# Patient Record
Sex: Female | Born: 1960
Health system: Southern US, Community
[De-identification: ages and names within clinical notes are randomized; demographics above are authoritative.]

## PROBLEM LIST (undated history)

## (undated) DIAGNOSIS — E11319 Type 2 diabetes mellitus with unspecified diabetic retinopathy without macular edema: Secondary | ICD-10-CM

## (undated) DIAGNOSIS — I219 Acute myocardial infarction, unspecified: Secondary | ICD-10-CM

## (undated) DIAGNOSIS — G629 Polyneuropathy, unspecified: Secondary | ICD-10-CM

## (undated) DIAGNOSIS — Z794 Long term (current) use of insulin: Secondary | ICD-10-CM

## (undated) DIAGNOSIS — Z8619 Personal history of other infectious and parasitic diseases: Secondary | ICD-10-CM

## (undated) DIAGNOSIS — IMO0002 Reserved for concepts with insufficient information to code with codable children: Secondary | ICD-10-CM

## (undated) DIAGNOSIS — E1165 Type 2 diabetes mellitus with hyperglycemia: Secondary | ICD-10-CM

## (undated) DIAGNOSIS — R351 Nocturia: Secondary | ICD-10-CM

## (undated) DIAGNOSIS — I10 Essential (primary) hypertension: Secondary | ICD-10-CM

## (undated) DIAGNOSIS — E785 Hyperlipidemia, unspecified: Secondary | ICD-10-CM

## (undated) DIAGNOSIS — D3502 Benign neoplasm of left adrenal gland: Secondary | ICD-10-CM

## (undated) DIAGNOSIS — R519 Headache, unspecified: Secondary | ICD-10-CM

## (undated) DIAGNOSIS — Z972 Presence of dental prosthetic device (complete) (partial): Secondary | ICD-10-CM

## (undated) DIAGNOSIS — I2510 Atherosclerotic heart disease of native coronary artery without angina pectoris: Secondary | ICD-10-CM

## (undated) DIAGNOSIS — Z973 Presence of spectacles and contact lenses: Secondary | ICD-10-CM

## (undated) DIAGNOSIS — E876 Hypokalemia: Secondary | ICD-10-CM

## (undated) DIAGNOSIS — C801 Malignant (primary) neoplasm, unspecified: Secondary | ICD-10-CM

## (undated) DIAGNOSIS — N182 Chronic kidney disease, stage 2 (mild): Secondary | ICD-10-CM

## (undated) DIAGNOSIS — N950 Postmenopausal bleeding: Secondary | ICD-10-CM

## (undated) DIAGNOSIS — Z8669 Personal history of other diseases of the nervous system and sense organs: Secondary | ICD-10-CM

## (undated) DIAGNOSIS — H269 Unspecified cataract: Secondary | ICD-10-CM

## (undated) DIAGNOSIS — E1140 Type 2 diabetes mellitus with diabetic neuropathy, unspecified: Secondary | ICD-10-CM

## (undated) DIAGNOSIS — F32A Depression, unspecified: Secondary | ICD-10-CM

## (undated) DIAGNOSIS — M1990 Unspecified osteoarthritis, unspecified site: Secondary | ICD-10-CM

## (undated) DIAGNOSIS — Z8544 Personal history of malignant neoplasm of other female genital organs: Secondary | ICD-10-CM

## (undated) DIAGNOSIS — H35039 Hypertensive retinopathy, unspecified eye: Secondary | ICD-10-CM

## (undated) DIAGNOSIS — F329 Major depressive disorder, single episode, unspecified: Secondary | ICD-10-CM

## (undated) HISTORY — DX: Hypertensive retinopathy, unspecified eye: H35.039

## (undated) HISTORY — PX: UMBILICAL HERNIA REPAIR: SHX196

## (undated) HISTORY — PX: CATARACT EXTRACTION, BILATERAL: SHX1313

## (undated) HISTORY — PX: EYE SURGERY: SHX253

## (undated) HISTORY — DX: Unspecified osteoarthritis, unspecified site: M19.90

## (undated) HISTORY — DX: Headache, unspecified: R51.9

## (undated) HISTORY — PX: CATARACT EXTRACTION: SUR2

## (undated) HISTORY — DX: Major depressive disorder, single episode, unspecified: F32.9

## (undated) HISTORY — DX: Depression, unspecified: F32.A

## (undated) HISTORY — DX: Hyperlipidemia, unspecified: E78.5

## (undated) HISTORY — DX: Type 2 diabetes mellitus with unspecified diabetic retinopathy without macular edema: E11.319

## (undated) HISTORY — PX: COLONOSCOPY: SHX174

## (undated) NOTE — *Deleted (*Deleted)
[facility] - Clinic Note  03/20/2020     CHIEF COMPLAINT Patient presents for No chief complaint on file.   HISTORY OF PRESENT ILLNESS: Kimberly Frazier is a 8 y.o. female who presents to the clinic today for:     Referring physician: Marcine Matar, MD [address] [place],  [place] [address]  HISTORICAL INFORMATION:   Selected notes from the MEDICAL RECORD NUMBER Referred by Dr. Jethro Bolus for concern of CME   CURRENT MEDICATIONS: Current Outpatient Medications (Ophthalmic Drugs)  Medication Sig  . Bromfenac Sodium (PROLENSA) 0.07 % SOLN Place 1 drop into both eyes 4 (four) times daily.  Marland Kitchen ketorolac (ACULAR) 0.5 % ophthalmic solution Place 1 drop into both eyes 4 (four) times daily.  . prednisoLONE acetate (PRED FORTE) 1 % ophthalmic suspension Place 1 drop into both eyes 4 (four) times daily.   No current facility-administered medications for this visit. (Ophthalmic Drugs)   Current Outpatient Medications (Other)  Medication Sig  . Accu-Chek Softclix Lancets lancets Use to check blood sugar 3 times daily.  Marland Kitchen albuterol (VENTOLIN HFA) 108 (90 Base) MCG/ACT inhaler Inhale 2 puffs into the lungs every 6 (six) hours as needed for wheezing or shortness of breath.  Marland Kitchen amLODipine (NORVASC) 5 MG tablet TAKE 1 TABLET (5 MG TOTAL) BY MOUTH DAILY.  Marland Kitchen aspirin EC 81 MG tablet Take 1 tablet (81 mg total) daily by mouth.  Marland Kitchen atorvastatin (LIPITOR) 40 MG tablet TAKE 1 TABLET DAILY BY MOUTH.  . Blood Glucose Monitoring Suppl (ACCU-CHEK GUIDE) w/Device KIT 1 kit by Does not apply route in the morning, at noon, and at bedtime. Use to check blood sugar 3 times daily.  . butalbital-acetaminophen-caffeine (FIORICET) 50-325-40 MG tablet Take 1 tablet by mouth every 6 (six) hours as needed for headache.  . carvedilol (COREG) 25 MG tablet TAKE 1 TABLET (25 MG TOTAL) BY MOUTH 2 (TWO) TIMES DAILY WITH A MEAL.  Marland Kitchen empagliflozin (JARDIANCE) 10 MG TABS tablet Take 1 tablet (10 mg  total) by mouth daily.  Marland Kitchen glucose blood (ACCU-CHEK GUIDE) test strip Use to check blood sugar 3 times daily.  . insulin aspart (NOVOLOG) 100 UNIT/ML injection Inject 28 Units into the skin 3 (three) times daily before meals.  . insulin glargine (LANTUS SOLOSTAR) 100 UNIT/ML Solostar Pen INJECT 60 UNITS INTO THE SKIN DAILY.  Marland Kitchen Insulin Syringe-Needle U-100 (TRUEPLUS INSULIN SYRINGE) 31G X 5/16" 0.3 ML MISC Use to inject Humalog three times daily.  . isosorbide mononitrate (IMDUR) 60 MG 24 hr tablet TAKE 1 TABLET (60 MG TOTAL) BY MOUTH DAILY.  . nitroGLYCERIN (NITROSTAT) 0.4 MG SL tablet Place 1 tab under tongue for chest pain.  May repeat after 5 minutes x 2.  DO NOT TAKE MORE THAN 3 TABS DURING AN EPISODE OF CHEST PAIN (Patient taking differently: Place 0.4 mg under the tongue every 5 (five) minutes as needed for chest pain. DO NOT TAKE MORE THAN 3 TABS DURING AN EPISODE OF CHEST PAIN)  . potassium chloride (KLOR-CON) 10 MEQ tablet Take 1 tablet (10 mEq total) by mouth daily.  Marland Kitchen tiZANidine (ZANAFLEX) 4 MG tablet Take 1 tablet (4 mg total) by mouth every 6 (six) hours as needed for muscle spasms.  Marland Kitchen topiramate (TOPAMAX) 50 MG tablet Take 1 tablet (50 mg total) by mouth 2 (two) times daily.  . TRUEPLUS PEN NEEDLES 32G X 4 MM MISC USE AS DIRECTED WITH INSULIN   No current facility-administered medications for this visit. (Other)  REVIEW OF SYSTEMS:    ALLERGIES No Known Allergies  PAST MEDICAL HISTORY Past Medical History:  Diagnosis Date  . Adrenal adenoma, left   . Arthritis   . CKD (chronic kidney disease), stage II   . Coronary artery disease 07-28-2018 [facility]  currently due to no insurance   per cardiac cath 02-06-2015 (positive mild lateral ishcemia on stress test)--- dLAD 80%,  mLAD 40%,  mPDA 80% (small vessel),  ostial D1 70%,  CFx with lumial irregarlities-- medical management  . Depression   . Diabetic neuropathy (HCC)   . Diabetic  retinopathy (HCC)    NPDR OU  . Headache   . History of Bell's palsy 07/2011   per pt residual facial pain on left side occasionally  . History of cancer of vagina 1999   per pt completed radiation and chemo  . History of sepsis 12/30/2017   positive blood culter fro E.coli  . Hyperlipidemia   . Hypertension   . Hypertensive retinopathy    OU  . Hypokalemia   . Insulin dependent type 2 diabetes mellitus, uncontrolled (HCC)    followed by pcp---  A1c was 11.7 on 06-15-2018 in epic  . Myocardial infarction (HCC)   . Nocturia   . Peripheral neuropathy   . PMB (postmenopausal bleeding)   . Wears dentures    upper  . Wears glasses    Past Surgical History:  Procedure Laterality Date  . BREAST BIOPSY  2012   benign  . CARDIAC CATHETERIZATION N/A 02/06/2015   Procedure: Left Heart Cath and Coronary Angiography;  Surgeon: Laurey Morale, MD;  Location: [facility] INVASIVE CV LAB;  Service: Cardiovascular;  Laterality: N/A;  . CATARACT EXTRACTION Bilateral OD: 03/07/19, OS: 02/28/19   Dr. Nile Riggs  . CATARACT EXTRACTION, BILATERAL    . EYE SURGERY Bilateral OD: 03/07/19, OS: 02/28/19   Cat Sx - Dr. Nile Riggs  . HYSTEROSCOPY WITH D & C N/A 08/01/2018   Procedure: DILATATION AND CURETTAGE /HYSTEROSCOPY;  Surgeon: Catalina Antigua, MD;  Location: [facility];  Service: Gynecology;  Laterality: N/A;  . ROBOTIC ASSISTED TOTAL HYSTERECTOMY WITH BILATERAL SALPINGO OOPHERECTOMY N/A 11/28/2018   Procedure: XI ROBOTIC ASSISTED TOTAL HYSTERECTOMY WITH BILATERAL SALPINGO OOPHORECTOMY;  Surgeon: Adolphus Birchwood, MD;  Location: [facility];  Service: Gynecology;  Laterality: N/A;  . SENTINEL NODE BIOPSY N/A 11/28/2018   Procedure: SENTINEL NODE BIOPSY;  Surgeon: Adolphus Birchwood, MD;  Location: [facility];  Service: Gynecology;  Laterality: N/A;  . UMBILICAL HERNIA REPAIR  child    FAMILY HISTORY Family History  Problem Relation Age of Onset  . Heart disease Mother   . Hypertension Mother   . Diabetes Mother   .  Cancer Father        hodgkins lymphoma  . Heart disease Sister        heart attack  . Diabetes Sister   . Cancer Other        parent  . Diabetes Other        parent  . Heart disease Other        parent  . Hyperlipidemia Other        parent  . Hypertension Other        parent  . Arthritis Other        parent  . Diabetes Maternal Aunt   . Diabetes Maternal Uncle   . Breast cancer Neg Hx     SOCIAL HISTORY Social History   Tobacco Use  .  Smoking status: Never Smoker  . Smokeless tobacco: Never Used  Vaping Use  . Vaping Use: Never used  Substance Use Topics  . Alcohol use: Not Currently  . Drug use: Not Currently    Types: Marijuana    Comment: 07-28-2018  per pt last smoked 11/21/2018         OPHTHALMIC EXAM:  Not recorded     IMAGING AND PROCEDURES  Imaging and Procedures for @TODAY @           ASSESSMENT/PLAN:    ICD-10-CM   1. Moderate nonproliferative diabetic retinopathy of both eyes with macular edema associated with type 2 diabetes mellitus (HCC)  O13.0865   2. CME (cystoid macular edema), bilateral  H35.353   3. Retinal edema  H35.81   4. Essential hypertension  I10   5. Hypertensive retinopathy of both eyes  H35.033   6. Pseudophakia of both eyes  Z96.1     1-3. Moderate Non-proliferative diabetic retinopathy, OU OD with combination DME/CME  - exam shows scattered IRH, edema and tortuous blood vessels OU  - FA (10.23.20) shows leaking MA OU and paracentral petaloid hyperfluorecence OD --> CME/[place]  - OCT shows diabetic macular edema, OU; OD with CME/[place] component contributing as well  - s/p IVA OS #1 (10.23.20), #2 (11.18.20), #3 (12.18.20), #4 (02.24.21), #5 (03.24.21)  - s/p IVA OD #1 (11.06.20), #2 (12.18.20), #3 (02.24.21), #4 (03.24.21), #5 (04.21.21), #6 (5.26.21), #7 (07.26.21), #8 (08.25.21), #9 (09.23.21)  - s/p IVE OS #1 (04.21.21), # 2 (5.26.21), #3 (07.26.21), #4 (08.25.21), #5 (09.23.21)  - BCVA OD 20/20; OS  20/100+2  - OCT today, 8.25.21 OD: persistent IRF/IRHM; OS: persistent IRF -- slightly improved  - recommend IVA OD #10 and IVE #6 OS 10.21.21  - pt wishes to proceed  - RBA of procedure discussed, questions answered  - informed consent obtained  - Avastin informed consent form signed and scanned on 12.18.2020  - Eylea OS informed consent form signed and scanned on 4.21.2021  - see procedure note  - Eylea4U benefits investigation started, 04.21.21  - cont Pred Forte and Ketorolac QID OU  - f/u in 4 weeks, DFE, OCT, possible injection OU  4,5. Hypertensive retinopathy OU  - discussed importance of tight BP control  - monitor  6. Pseudophakia OU  - s/p CE/IOL OU Nile Riggs)   OD: 03/07/2019   OS: 02/28/2019  - IOLs in good position  - CME OU as above  - monitor  Ophthalmic Meds Ordered this visit:  No orders of the defined types were placed in this encounter.      No follow-ups on file.  There are no Patient Instructions on file for this visit.   Explained the diagnoses, plan, and follow up with the patient and they expressed understanding.  Patient expressed understanding of the importance of proper follow up care.   This document serves as a record of services personally performed by Karie Chimera, MD, PhD. It was created on their behalf by Joni Reining, an ophthalmic technician. The creation of this record is the provider's dictation and/or activities during the visit.    Electronically signed by: Joni Reining COA, 03/17/20  12:07 PM   Karie Chimera, M.D., Ph.D. Diseases & Surgery of the Retina and Vitreous [facility]    Abbreviations: M myopia (nearsighted); A astigmatism; H hyperopia (farsighted); P presbyopia; Mrx spectacle prescription;  CTL contact lenses; OD right eye; OS left eye; OU both eyes  XT exotropia;  ET esotropia; PEK punctate epithelial keratitis; PEE punctate epithelial erosions; DES dry eye syndrome; MGD meibomian gland  dysfunction; ATs artificial tears; PFAT's preservative free artificial tears; NSC nuclear sclerotic cataract; PSC posterior subcapsular cataract; ERM epi-retinal membrane; PVD posterior vitreous detachment; RD retinal detachment; DM diabetes mellitus; DR diabetic retinopathy; NPDR non-proliferative diabetic retinopathy; PDR proliferative diabetic retinopathy; CSME clinically significant macular edema; DME diabetic macular edema; dbh dot blot hemorrhages; CWS cotton wool spot; POAG primary open angle glaucoma; C/D cup-to-disc ratio; HVF humphrey visual field; GVF goldmann visual field; OCT optical coherence tomography; IOP intraocular pressure; BRVO Branch retinal vein occlusion; CRVO central retinal vein occlusion; CRAO central retinal artery occlusion; BRAO branch retinal artery occlusion; RT retinal tear; SB scleral buckle; PPV pars plana vitrectomy; VH Vitreous hemorrhage; PRP panretinal laser photocoagulation; IVK intravitreal kenalog; VMT vitreomacular traction; MH Macular hole;  NVD neovascularization of the disc; NVE neovascularization elsewhere; AREDS age related eye disease study; ARMD age related macular degeneration; POAG primary open angle glaucoma; EBMD epithelial/anterior basement membrane dystrophy; ACIOL anterior chamber intraocular lens; IOL intraocular lens; PCIOL posterior chamber intraocular lens; Phaco/IOL phacoemulsification with intraocular lens placement; PRK photorefractive keratectomy; LASIK laser assisted in situ keratomileusis; HTN hypertension; DM diabetes mellitus; COPD chronic obstructive pulmonary disease

---
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
## 1997-05-31 DIAGNOSIS — Z8544 Personal history of malignant neoplasm of other female genital organs: Secondary | ICD-10-CM

## 1997-05-31 HISTORY — DX: Personal history of malignant neoplasm of other female genital organs: Z85.44

## 1997-12-18 ENCOUNTER — Ambulatory Visit: Admission: RE | Admit: 1997-12-18 | Discharge: 1997-12-18 | Payer: Self-pay | Admitting: Gynecologic Oncology

## 1998-01-21 ENCOUNTER — Ambulatory Visit: Admission: RE | Admit: 1998-01-21 | Discharge: 1998-01-21 | Payer: Self-pay | Admitting: Gynecologic Oncology

## 1998-05-16 ENCOUNTER — Emergency Department (HOSPITAL_COMMUNITY): Admission: EM | Admit: 1998-05-16 | Discharge: 1998-05-16 | Payer: Self-pay | Admitting: Emergency Medicine

## 1998-05-16 ENCOUNTER — Encounter: Admission: RE | Admit: 1998-05-16 | Discharge: 1998-05-16 | Payer: Self-pay | Admitting: Family Medicine

## 1998-07-11 ENCOUNTER — Encounter: Admission: RE | Admit: 1998-07-11 | Discharge: 1998-07-11 | Payer: Self-pay | Admitting: Family Medicine

## 1998-10-16 ENCOUNTER — Other Ambulatory Visit: Admission: RE | Admit: 1998-10-16 | Discharge: 1998-10-16 | Payer: Self-pay | Admitting: Internal Medicine

## 1999-09-29 ENCOUNTER — Encounter (INDEPENDENT_AMBULATORY_CARE_PROVIDER_SITE_OTHER): Payer: Self-pay | Admitting: *Deleted

## 1999-09-29 LAB — CONVERTED CEMR LAB

## 1999-10-20 ENCOUNTER — Encounter: Admission: RE | Admit: 1999-10-20 | Discharge: 1999-10-20 | Payer: Self-pay | Admitting: Family Medicine

## 1999-10-20 ENCOUNTER — Other Ambulatory Visit: Admission: RE | Admit: 1999-10-20 | Discharge: 1999-10-20 | Payer: Self-pay | Admitting: Family Medicine

## 1999-12-23 ENCOUNTER — Ambulatory Visit (HOSPITAL_COMMUNITY): Admission: RE | Admit: 1999-12-23 | Discharge: 1999-12-23 | Payer: Self-pay | Admitting: *Deleted

## 2000-02-21 ENCOUNTER — Inpatient Hospital Stay (HOSPITAL_COMMUNITY): Admission: AD | Admit: 2000-02-21 | Discharge: 2000-02-21 | Payer: Self-pay | Admitting: *Deleted

## 2001-07-26 ENCOUNTER — Emergency Department (HOSPITAL_COMMUNITY): Admission: EM | Admit: 2001-07-26 | Discharge: 2001-07-26 | Payer: Self-pay | Admitting: Emergency Medicine

## 2002-01-10 ENCOUNTER — Other Ambulatory Visit: Admission: RE | Admit: 2002-01-10 | Discharge: 2002-01-10 | Payer: Self-pay | Admitting: Obstetrics

## 2003-01-18 ENCOUNTER — Encounter: Payer: Self-pay | Admitting: Geriatric Medicine

## 2003-01-18 ENCOUNTER — Inpatient Hospital Stay (HOSPITAL_COMMUNITY): Admission: EM | Admit: 2003-01-18 | Discharge: 2003-01-21 | Payer: Self-pay | Admitting: Emergency Medicine

## 2004-02-17 ENCOUNTER — Emergency Department (HOSPITAL_COMMUNITY): Admission: EM | Admit: 2004-02-17 | Discharge: 2004-02-17 | Payer: Self-pay | Admitting: Family Medicine

## 2004-12-27 ENCOUNTER — Emergency Department (HOSPITAL_COMMUNITY): Admission: EM | Admit: 2004-12-27 | Discharge: 2004-12-27 | Payer: Self-pay | Admitting: Family Medicine

## 2005-01-15 ENCOUNTER — Emergency Department (HOSPITAL_COMMUNITY): Admission: EM | Admit: 2005-01-15 | Discharge: 2005-01-15 | Payer: Self-pay | Admitting: Family Medicine

## 2005-10-07 ENCOUNTER — Ambulatory Visit (HOSPITAL_COMMUNITY): Admission: RE | Admit: 2005-10-07 | Discharge: 2005-10-07 | Payer: Self-pay | Admitting: Internal Medicine

## 2006-07-07 ENCOUNTER — Emergency Department (HOSPITAL_COMMUNITY): Admission: EM | Admit: 2006-07-07 | Discharge: 2006-07-07 | Payer: Self-pay | Admitting: Family Medicine

## 2006-07-29 ENCOUNTER — Encounter (INDEPENDENT_AMBULATORY_CARE_PROVIDER_SITE_OTHER): Payer: Self-pay | Admitting: *Deleted

## 2007-07-20 ENCOUNTER — Ambulatory Visit (HOSPITAL_COMMUNITY): Admission: RE | Admit: 2007-07-20 | Discharge: 2007-07-20 | Payer: Self-pay | Admitting: Obstetrics

## 2008-04-04 ENCOUNTER — Encounter: Admission: RE | Admit: 2008-04-04 | Discharge: 2008-04-04 | Payer: Self-pay | Admitting: Internal Medicine

## 2008-12-17 ENCOUNTER — Ambulatory Visit (HOSPITAL_COMMUNITY): Admission: RE | Admit: 2008-12-17 | Discharge: 2008-12-17 | Payer: Self-pay | Admitting: Internal Medicine

## 2010-05-31 HISTORY — PX: BREAST BIOPSY: SHX20

## 2010-06-20 ENCOUNTER — Emergency Department (HOSPITAL_COMMUNITY)
Admission: EM | Admit: 2010-06-20 | Discharge: 2010-06-20 | Payer: Self-pay | Source: Home / Self Care | Admitting: Emergency Medicine

## 2010-10-16 NOTE — Discharge Summary (Signed)
   [name], Kimberly Frazier                            ACCOUNT NO.:  [id number]   MEDICAL RECORD NO.:  [id number]                   PATIENT TYPE:  INP   LOCATION:  6729                                 FACILITY:  [facility]   PHYSICIAN:  Georgann Housekeeper, M.D.                 DATE OF BIRTH:  1961/04/24   DATE OF ADMISSION:  01/18/2003  DATE OF DISCHARGE:  01/21/2003                                 DISCHARGE SUMMARY   DISCHARGE DIAGNOSES:  1. Type 2 diabetes, new onset, with hyperglycemia.  2. Hypokalemia.  3. Hypertension.  4. Mild dehydration.   MEDICATIONS AT DISCHARGE:  1. Zestoretic 20/25 mg one a day.  2. Glucotrol 10 mg b.i.d.   FOLLOWUP:  Diabetic and nutrition teaching.  Blood sugar monitoring.  Follow  up in 10 days.   DIET:  A low-carbohydrate, low-sugar diet.   HISTORY OF PRESENT ILLNESS:  Please see the H&P for details.  The patient is  50 years old with a family history of diabetes.  She presented with two to  three weeks of worsening thirst, polyuria, and polydipsia with sugars in the  800s.  Admission was 874 with a sodium of 128.  Correct was 138.  BUN 14,  creatinine 1.2.  Mildly hypokalemic.  The patient was started on  Glucommander.   HOSPITAL COURSE:  #1 - TYPE 2 DIABETES, NEW ONSET, WITH HYPERGLYCEMIA:  Started on Glucommander.  Improved blood sugars with insulin and aggressive  hydration.  Was switched to oral agents, Glucotrol and Glucophage.  The  patient had nausea, heartburn, and indigestion with Glucophage and could not  tolerate that.  The patient would continue on Glucotrol and follow up with  blood sugar at home.  Her blood sugar was in the 280s at the time of  discharge.  She might need Avandia or Actos as an outpatient.  Will see how  she does in the next 10 days with Glucotrol and titrate the medication as  tolerated.  The other option is insulin if she does not control with oral  agents.  A1C was done at the office and that is pending.   #2 -  HYPERTENSION:  The patient has been restarted on Zestril in the  hospital after the second day.  She will continue the blood pressure  medication.  Will need lipids and aggressive dietary management.                                                Georgann Housekeeper, M.D.    Arliss Journey  D:  01/21/2003  T:  01/21/2003  Job:  [id number]

## 2010-11-02 ENCOUNTER — Other Ambulatory Visit (HOSPITAL_COMMUNITY): Payer: Self-pay | Admitting: Internal Medicine

## 2010-11-02 DIAGNOSIS — Z1231 Encounter for screening mammogram for malignant neoplasm of breast: Secondary | ICD-10-CM

## 2010-11-10 ENCOUNTER — Ambulatory Visit (HOSPITAL_COMMUNITY)
Admission: RE | Admit: 2010-11-10 | Discharge: 2010-11-10 | Disposition: A | Discharge status: Home / Self Care | Payer: 59 | Source: Ambulatory Visit | Attending: Internal Medicine | Admitting: Internal Medicine

## 2010-11-10 ENCOUNTER — Other Ambulatory Visit: Payer: Self-pay | Admitting: Internal Medicine

## 2010-11-10 DIAGNOSIS — Z1231 Encounter for screening mammogram for malignant neoplasm of breast: Secondary | ICD-10-CM

## 2010-11-11 ENCOUNTER — Other Ambulatory Visit: Payer: Self-pay | Admitting: Internal Medicine

## 2010-11-11 DIAGNOSIS — N63 Unspecified lump in unspecified breast: Secondary | ICD-10-CM

## 2010-11-16 ENCOUNTER — Other Ambulatory Visit: Payer: Self-pay | Admitting: Internal Medicine

## 2010-11-16 DIAGNOSIS — N63 Unspecified lump in unspecified breast: Secondary | ICD-10-CM

## 2010-11-19 ENCOUNTER — Ambulatory Visit
Admission: RE | Admit: 2010-11-19 | Discharge: 2010-11-19 | Disposition: A | Discharge status: Home / Self Care | Payer: 59 | Source: Ambulatory Visit | Attending: Internal Medicine | Admitting: Internal Medicine

## 2010-11-19 ENCOUNTER — Other Ambulatory Visit: Payer: Self-pay | Admitting: Internal Medicine

## 2010-11-19 DIAGNOSIS — N63 Unspecified lump in unspecified breast: Secondary | ICD-10-CM

## 2010-11-23 ENCOUNTER — Other Ambulatory Visit: Payer: 59

## 2010-11-26 ENCOUNTER — Ambulatory Visit
Admission: RE | Admit: 2010-11-26 | Discharge: 2010-11-26 | Disposition: A | Discharge status: Home / Self Care | Payer: 59 | Source: Ambulatory Visit | Attending: Internal Medicine | Admitting: Internal Medicine

## 2010-11-26 ENCOUNTER — Other Ambulatory Visit: Payer: Self-pay | Admitting: Diagnostic Radiology

## 2010-11-26 DIAGNOSIS — N63 Unspecified lump in unspecified breast: Secondary | ICD-10-CM

## 2011-06-01 LAB — HM DIABETES FOOT EXAM

## 2011-07-02 DIAGNOSIS — Z8669 Personal history of other diseases of the nervous system and sense organs: Secondary | ICD-10-CM

## 2011-07-02 HISTORY — DX: Personal history of other diseases of the nervous system and sense organs: Z86.69

## 2011-07-26 ENCOUNTER — Encounter (HOSPITAL_COMMUNITY): Payer: Self-pay | Admitting: Emergency Medicine

## 2011-07-26 ENCOUNTER — Emergency Department (HOSPITAL_COMMUNITY)
Admission: EM | Admit: 2011-07-26 | Discharge: 2011-07-26 | Disposition: A | Discharge status: Home Health | Payer: 59 | Attending: Emergency Medicine | Admitting: Emergency Medicine

## 2011-07-26 ENCOUNTER — Emergency Department (HOSPITAL_COMMUNITY): Payer: 59

## 2011-07-26 DIAGNOSIS — I10 Essential (primary) hypertension: Secondary | ICD-10-CM | POA: Insufficient documentation

## 2011-07-26 DIAGNOSIS — R51 Headache: Secondary | ICD-10-CM | POA: Insufficient documentation

## 2011-07-26 DIAGNOSIS — R29810 Facial weakness: Secondary | ICD-10-CM | POA: Insufficient documentation

## 2011-07-26 DIAGNOSIS — G510 Bell's palsy: Secondary | ICD-10-CM | POA: Insufficient documentation

## 2011-07-26 DIAGNOSIS — Z79899 Other long term (current) drug therapy: Secondary | ICD-10-CM | POA: Insufficient documentation

## 2011-07-26 DIAGNOSIS — E119 Type 2 diabetes mellitus without complications: Secondary | ICD-10-CM | POA: Insufficient documentation

## 2011-07-26 HISTORY — DX: Essential (primary) hypertension: I10

## 2011-07-26 LAB — BASIC METABOLIC PANEL
CO2: 27 mEq/L (ref 19–32)
Chloride: 103 mEq/L (ref 96–112)
Creatinine, Ser: 0.72 mg/dL (ref 0.50–1.10)
GFR calc Af Amer: >90 mL/min (ref >90)
Potassium: 2.9 mEq/L — ABNORMAL LOW (ref 3.5–5.1)
Sodium: 141 mEq/L (ref 135–145)

## 2011-07-26 LAB — CBC
HCT: 42.5 % (ref 36.0–46.0)
Hemoglobin: 14.3 g/dL (ref 12.0–15.0)
MCV: 88.4 fL (ref 78.0–100.0)
RBC: 4.81 MIL/uL (ref 3.87–5.11)
WBC: 15.4 10*3/uL — ABNORMAL HIGH (ref 4.0–10.5)

## 2011-07-26 MED ORDER — POLYVINYL ALCOHOL 1.4 % OP SOLN: 1.0000 [drp] | OPHTHALMIC | Status: AC | PRN

## 2011-07-26 NOTE — Discharge Instructions (Signed)
Bell's Palsy Bell's palsy is a condition in which the muscles on one side of the face cannot move (paralysis). This is because the nerves in the face are paralyzed. It is most often thought to be caused by a virus. The virus causes swelling of the nerve that controls movement on one side of the face. The nerve travels through a tight space surrounded by bone. When the nerve swells, it can be compressed by the bone. This results in damage to the protective covering around the nerve. This damage interferes with how the nerve communicates with the muscles of the face. As a result, it can cause weakness or paralysis of the facial muscles.  Injury (trauma), tumor, and surgery may cause Bell's palsy, but most of the time the cause is unknown. It is a relatively common condition. It starts suddenly (abrupt onset) with the paralysis usually ending within 2 days. Bell's palsy is not dangerous. But because the eye does not close properly, you may need care to keep the eye from getting dry. This can include splinting (to keep the eye shut) or moistening with artificial tears. Bell's palsy very seldom occurs on both sides of the face at the same time. SYMPTOMS   Eyebrow sagging.   Drooping of the eyelid and corner of the mouth.   Inability to close one eye.   Loss of taste on the front of the tongue.   Sensitivity to loud noises.  TREATMENT  The treatment is usually non-surgical. If the patient is seen within the first 24 to 48 hours, a short course of steroids may be prescribed, in an attempt to shorten the length of the condition. Antiviral medicines may also be used with the steroids, but it is unclear if they are helpful.  You will need to protect your eye, if you cannot close it. The cornea (clear covering over your eye) will become dry and can be damaged. Artificial tears can be used to keep your eye moist. Glasses or an eye patch should be worn to protect your eye. PROGNOSIS  Recovery is variable,  ranging from days to months. Although the problem usually goes away completely (about 80% of cases resolve), predicting the outcome is impossible. Most people improve within 3 weeks of when the symptoms began. Improvement may continue for 3 to 6 months. A small number of people have moderate to severe weakness that is permanent.  HOME CARE INSTRUCTIONS   If your caregiver prescribed medication to reduce swelling in the nerve, use as directed. Do not stop taking the medication unless directed by your caregiver.   Use moisturizing eye drops as needed to prevent drying of your eye, as directed by your caregiver.   Protect your eye, as directed by your caregiver.   Use facial massage and exercises, as directed by your caregiver.   Perform your normal activities, and get your normal rest.  SEEK IMMEDIATE MEDICAL CARE IF:   There is pain, redness or irritation in the eye.   You or your child has an oral temperature above 102 F (38.9 C), not controlled by medicine.  MAKE SURE YOU:   Understand these instructions.   Will watch your condition.   Will get help right away if you are not doing well or get worse.  Document Released: 05/17/2005 Document Revised: 01/27/2011 Document Reviewed: 05/26/2009 [facility] Patient Information 2012 [place], [place].

## 2011-07-26 NOTE — ED Notes (Signed)
Pt c/o left sided facial pain and droop noted starting on Saturday; pt sts left eye draining as well; pt denies any other weakness; pt sts liquids running out of mouth; pt equal grip strength and no other neuro deficits noted

## 2011-07-26 NOTE — ED Provider Notes (Signed)
History     CSN: [id number]  Arrival date & time 07/26/11  1478   First MD Initiated Contact with Patient 07/26/11 1004      Chief Complaint  Patient presents with  . Facial Droop    (Consider location/radiation/quality/duration/timing/severity/associated sxs/prior treatment) HPI Pt presents with left facial droop that started Saturday.  She says she was drinking water and it would fall out of the left side of her mouth.  She feels like she cannot control the left side of her lips normally.  She says sometimes her left eye seems like there is a haziness over it.  She also complains of a headache, which is left sided.   She has never had a stroke or heart attack in the past, but does say that her blood sugar and blood pressure are not well controlled.    Past Medical History  Diagnosis Date  . Hypertension   . Diabetes mellitus     History reviewed. No pertinent past surgical history.  History reviewed. No pertinent family history.  History  Substance Use Topics  . Smoking status: Never Smoker   . Smokeless tobacco: Not on file  . Alcohol Use: No    Review of Systems  Allergies  Review of patient's allergies indicates no known allergies.  Home Medications   Current Outpatient Rx  Name Route Sig Dispense Refill  . GLIMEPIRIDE 4 MG PO TABS Oral Take 4 mg by mouth daily before breakfast.    . HYDRALAZINE HCL 50 MG PO TABS Oral Take 50 mg by mouth 2 (two) times daily.    . INSULIN ASPART PROT & ASPART (70-30) 100 UNIT/ML [place] SUSP Subcutaneous Inject 65 Units into the skin 2 (two) times daily with a meal.    . LISINOPRIL-HYDROCHLOROTHIAZIDE 20-25 MG PO TABS Oral Take 1 tablet by mouth daily.      BP 154/91  Pulse 105  Temp(Src) 98.4 F (36.9 C) (Oral)  Resp 18  SpO2 98%  Physical Exam  Constitutional: She is oriented to person, place, and time. She appears well-developed and well-nourished. No distress.  HENT:  Head: Normocephalic and atraumatic.  Mouth/Throat:  Oropharynx is clear and moist.  Eyes: EOM are normal. Pupils are equal, round, and reactive to light.  Neck: Normal range of motion. No JVD present.  Cardiovascular: Normal rate, regular rhythm and normal heart sounds.   Pulmonary/Chest: Effort normal and breath sounds normal.  Abdominal: Soft. Bowel sounds are normal.  Musculoskeletal: She exhibits no edema.  Neurological: She is alert and oriented to person, place, and time. She has normal strength. A cranial nerve deficit is present. No sensory deficit.       Pt has asymmetry of smile, as well as forehead with wrinkling.  Other CN in tact.     ED Course  Procedures (including critical care time)  Labs Reviewed  BASIC METABOLIC PANEL - Abnormal; Notable for the following:    Potassium 2.9 (*)    All other components within normal limits  CBC - Abnormal; Notable for the following:    WBC 15.4 (*)    All other components within normal limits   Ct Head Wo Contrast  07/26/2011  *RADIOLOGY REPORT*  Clinical Data: Slurred speech.  Left facial droop.  CT HEAD WITHOUT CONTRAST  Technique:  Contiguous axial images were obtained from the base of the skull through the vertex without contrast.  Comparison: None.  Findings: The brain has a normal appearance without evidence of malformation, atrophy, old or acute  infarction, mass lesion, hemorrhage, hydrocephalus or extra-axial collection.  The calvarium is unremarkable.  Sinuses, middle ears and mastoids are clear.  IMPRESSION: Normal head CT  Original Report Authenticated By: Thomasenia Sales, M.D.     1. Bell's palsy       MDM  Pt education provided, will Rx Liquid tears.  Pt to follow up with PCP and/or [facility].         Ardyth Gal, MD 07/26/11 1149

## 2011-07-26 NOTE — ED Notes (Signed)
[place] sandwich provided to patient per patient request.  Updated patient on status.

## 2011-07-26 NOTE — ED Provider Notes (Signed)
I saw and evaluated the patient, reviewed the resident's note and I agree with the findings and plan.  Patient seen by me symptoms at least present for the past 2 days consistent with a left-sided Bell's palsy facial nerve paralysis involves the 4 head. Not consistent with CVA or stroke. Head CT was negative patient has followup with neurology and has a local primary care Dr.  Shelda Jakes, MD 07/26/11 1213

## 2011-07-26 NOTE — ED Notes (Signed)
Patient presents with headache, moments of slurred speech, left facial droop, and left eye irritation intermittently since Saturday. Grips equal bilaterally, no arm drift noted, patient face seems slightly asymmetrical but no significant droop noted.  Patient alert and oriented x 4 follows commands appropriately, no distress noted, patient resting quietly

## 2012-05-12 ENCOUNTER — Ambulatory Visit: Payer: 59 | Admitting: Internal Medicine

## 2012-05-12 DIAGNOSIS — Z0289 Encounter for other administrative examinations: Secondary | ICD-10-CM

## 2012-07-26 ENCOUNTER — Other Ambulatory Visit (INDEPENDENT_AMBULATORY_CARE_PROVIDER_SITE_OTHER): Payer: 59

## 2012-07-26 ENCOUNTER — Ambulatory Visit (INDEPENDENT_AMBULATORY_CARE_PROVIDER_SITE_OTHER): Payer: 59 | Admitting: Internal Medicine

## 2012-07-26 ENCOUNTER — Encounter: Payer: Self-pay | Admitting: Internal Medicine

## 2012-07-26 VITALS — BP 148/92 | HR 99 | Temp 98.5°F | Ht 64.0 in | Wt 277.4 lb

## 2012-07-26 DIAGNOSIS — Z1329 Encounter for screening for other suspected endocrine disorder: Secondary | ICD-10-CM

## 2012-07-26 LAB — LIPID PANEL
Total CHOL/HDL Ratio: 6
Triglycerides: 170 mg/dL — ABNORMAL HIGH (ref 0.0–149.0)

## 2012-07-26 LAB — BASIC METABOLIC PANEL
BUN: 15 mg/dL (ref 6–23)
CO2: 30 mEq/L (ref 19–32)
Calcium: 9.2 mg/dL (ref 8.4–10.5)
Chloride: 100 mEq/L (ref 96–112)
Creatinine, Ser: 0.8 mg/dL (ref 0.4–1.2)
GFR: 90.89 mL/min (ref >60.00)
Glucose, Bld: 173 mg/dL — ABNORMAL HIGH (ref 70–99)
Potassium: 3.3 mEq/L — ABNORMAL LOW (ref 3.5–5.1)
Sodium: 138 mEq/L (ref 135–145)

## 2012-07-26 LAB — CBC
HCT: 41 % (ref 36.0–46.0)
Hemoglobin: 13.7 g/dL (ref 12.0–15.0)
MCHC: 33.4 g/dL (ref 30.0–36.0)
MCV: 88.2 fl (ref 78.0–100.0)
Platelets: 224 10*3/uL (ref 150.0–400.0)
RBC: 4.65 Mil/uL (ref 3.87–5.11)
RDW: 14.6 % (ref 11.5–14.6)
WBC: 11.1 10*3/uL — ABNORMAL HIGH (ref 4.5–10.5)

## 2012-07-26 LAB — TSH: TSH: 0.99 u[IU]/mL (ref 0.35–5.50)

## 2012-07-26 MED ORDER — INSULIN ASPART PROT & ASPART (70-30 MIX) 100 UNIT/ML ~~LOC~~ SUSP: 62.0000 [IU] | Freq: Every day | SUBCUTANEOUS | Status: DC

## 2012-07-26 MED ORDER — "INSULIN SYRINGE-NEEDLE U-100 31G X 5/16"" 1 ML MISC": Status: DC

## 2012-07-26 MED ORDER — GLIMEPIRIDE 4 MG PO TABS: 4.0000 mg | ORAL_TABLET | Freq: Every day | ORAL | Status: DC

## 2012-07-26 MED ORDER — HYDRALAZINE HCL 50 MG PO TABS: 50.0000 mg | ORAL_TABLET | Freq: Two times a day (BID) | ORAL | Status: DC

## 2012-07-26 MED ORDER — LISINOPRIL-HYDROCHLOROTHIAZIDE 20-25 MG PO TABS: 1.0000 | ORAL_TABLET | Freq: Every day | ORAL | Status: DC

## 2012-07-26 NOTE — Patient Instructions (Signed)

## 2012-07-26 NOTE — Addendum Note (Signed)
Addended by: Brenton Grills C on: 07/26/2012 11:25 AM   Modules accepted: Orders

## 2012-07-26 NOTE — Assessment & Plan Note (Signed)
Will continue current meds at this time Will check HgBA1C today Continue to avoid carbs in your diet.

## 2012-07-26 NOTE — Assessment & Plan Note (Signed)
Secondary to difficult life situation (recent ending of a 23 year relationship) Would prefer to talk with social worker as opposed to daily medication May benefit from CBT

## 2012-07-26 NOTE — Assessment & Plan Note (Signed)
Will continue current meds at this time Will check kidney function today Continue to avoid salt in your diet Try to check you BP at the pharmacy at least once per week

## 2012-07-26 NOTE — Progress Notes (Signed)
HPI  Pt presents to the clinic today to establish care. She is transferring care from [facility] She does have a history of Hypertension. She has been out of her medications for the past month. She does not check her blood pressure at home. She also has a history of Diabetes, diagnosed 1 years ago. She checks her sugar daily which runs from 100-238. She has not had any complications from her diabetes so far.  Flu: 03/2012 Tetanus: more than 10 years Pneumovax: never LMP: 1997 (stop menstruating due to radiation from uterine cancer) Mammogram: 2012 Pap Smear: 2009 Eye doctor: yearly Dentist: yearly  Past Medical History  Diagnosis Date  . Hypertension   . Diabetes mellitus   . Arthritis   . Depression   . Hyperlipidemia     Current Outpatient Prescriptions  Medication Sig Dispense Refill  . glimepiride (AMARYL) 4 MG tablet Take 1 tablet (4 mg total) by mouth daily before breakfast.  30 tablet  2  . insulin aspart protamine-insulin aspart (NOVOLOG 70/30) (70-30) 100 UNIT/ML injection Inject 62 Units into the skin daily.  10 mL  2  . lisinopril-hydrochlorothiazide (PRINZIDE,ZESTORETIC) 20-25 MG per tablet Take 1 tablet by mouth daily.  30 tablet  2  . Multiple Vitamins-Minerals (HAIR/SKIN/NAILS/BIOTIN) TABS Take 1 tablet by mouth daily.      Marland Kitchen POTASSIUM CHLORIDE PO Take 1 tablet by mouth daily.      . hydrALAZINE (APRESOLINE) 50 MG tablet Take 1 tablet (50 mg total) by mouth 2 (two) times daily.  60 tablet  2   No current facility-administered medications for this visit.    No Known Allergies  Family History  Problem Relation Age of Onset  . Cancer Other     parent  . Diabetes Other     parent  . Heart disease Other     parent  . Hyperlipidemia Other     parent  . Hypertension Other     parent  . Arthritis Other     parent    History   Social History  . Marital Status: Single    Spouse Name: N/A    Number of Children: 2  . Years of Education: 12    Occupational History  . [employer]   Social History Main Topics  . Smoking status: Never Smoker   . Smokeless tobacco: Never Used  . Alcohol Use: Yes  . Drug Use: Yes    Special: Marijuana  . Sexually Active: No   Other Topics Concern  . Not on file   Social History Narrative   Regular exercise-no   No caffeine use    ROS:  Constitutional: Pt reports headaches. Denies fever, malaise, fatigue, or abrupt weight changes.  HEENT: Denies eye pain, eye redness, ear pain, ringing in the ears, wax buildup, runny nose, nasal congestion, bloody nose, or sore throat. Respiratory: Denies difficulty breathing, shortness of breath, cough or sputum production.   Cardiovascular: Denies chest pain, chest tightness, palpitations or swelling in the hands or feet.  Gastrointestinal: Pt reports intermittent diarrhea. Denies abdominal pain, bloating, constipation or blood in the stool.  GU: Denies frequency, urgency, pain with urination, blood in urine, odor or discharge. Musculoskeletal: Pt reports generalized joint pain secondary to arthritis. Denies decrease in range of motion, difficulty with gait, muscle pain.  Skin: Denies redness, rashes, lesions or ulcercations.  Neurological: Denies dizziness, difficulty with memory, difficulty with speech or problems with balance and coordination.   No other specific complaints in a complete  review of systems (except as listed in HPI above).  PE:  BP 148/92  Pulse 99  Temp(Src) 98.5 F (36.9 C) (Oral)  Ht 5\' 4"  (1.626 m)  Wt 277 lb 6.4 oz (125.828 kg)  BMI 47.59 kg/m2  SpO2 97% Wt Readings from Last 3 Encounters:  07/26/12 277 lb 6.4 oz (125.828 kg)    General: Appears her stated age, obese but well developed, well nourished in NAD. HEENT: Head: normal shape and size; Eyes: sclera white, no icterus, conjunctiva pink, PERRLA and EOMs intact; Ears: Tm's gray and intact, normal light reflex; Nose: mucosa pink and moist, septum midline;  Throat/Mouth: Teeth present, mucosa pink and moist, no lesions or ulcerations noted.  Neck: Normal range of motion. Neck supple, trachea midline. No massses, lumps or thyromegaly present.  Cardiovascular: Normal rate and rhythm. S1,S2 noted.  No murmur, rubs or gallops noted. No JVD or BLE edema. No carotid bruits noted. Pulmonary/Chest: Normal effort and positive vesicular breath sounds. No respiratory distress. No wheezes, rales or ronchi noted.  Abdomen: Soft and nontender. Normal bowel sounds, no bruits noted. No distention or masses noted. Liver, spleen and kidneys non palpable. Musculoskeletal: Normal range of motion. No signs of joint swelling. No difficulty with gait.  Neurological: Alert and oriented. Cranial nerves II-XII intact. Coordination normal. +DTRs bilaterally. Psychiatric: Mood slightly depressed and affect flat. Behavior is normal. Judgment and thought content normal.    Assessment and Plan:  Preventative Health Maintenance:  Start a diet and exercise program Tdap and pneumovax given today Will refer for mammogram Will refer to gyn for pap smear  RTC in 3 months for follow up

## 2012-07-27 ENCOUNTER — Other Ambulatory Visit: Payer: Self-pay | Admitting: *Deleted

## 2012-07-27 DIAGNOSIS — N63 Unspecified lump in unspecified breast: Secondary | ICD-10-CM

## 2012-07-27 MED ORDER — POTASSIUM CHLORIDE CRYS ER 20 MEQ PO TBCR: 20.0000 meq | EXTENDED_RELEASE_TABLET | Freq: Every day | ORAL | Status: DC

## 2012-07-27 NOTE — Addendum Note (Signed)
Addended by: Lorre Munroe on: 07/27/2012 02:58 PM   Modules accepted: Orders

## 2012-08-15 ENCOUNTER — Ambulatory Visit: Payer: 59 | Admitting: Psychiatry

## 2012-08-17 ENCOUNTER — Encounter: Payer: 59 | Admitting: Obstetrics and Gynecology

## 2012-08-17 ENCOUNTER — Ambulatory Visit: Payer: 59 | Admitting: Psychiatry

## 2012-08-18 ENCOUNTER — Other Ambulatory Visit: Payer: Self-pay | Admitting: *Deleted

## 2012-08-18 DIAGNOSIS — E118 Type 2 diabetes mellitus with unspecified complications: Secondary | ICD-10-CM

## 2012-08-18 MED ORDER — INSULIN ASPART PROT & ASPART (70-30 MIX) 100 UNIT/ML ~~LOC~~ SUSP: 62.0000 [IU] | Freq: Every day | SUBCUTANEOUS | Status: DC

## 2012-10-25 ENCOUNTER — Ambulatory Visit (INDEPENDENT_AMBULATORY_CARE_PROVIDER_SITE_OTHER): Payer: 59 | Admitting: Internal Medicine

## 2012-10-25 ENCOUNTER — Encounter: Payer: Self-pay | Admitting: Internal Medicine

## 2012-10-25 ENCOUNTER — Other Ambulatory Visit (INDEPENDENT_AMBULATORY_CARE_PROVIDER_SITE_OTHER): Payer: 59

## 2012-10-25 VITALS — BP 142/86 | HR 96 | Temp 98.0°F | Ht 64.0 in | Wt 275.0 lb

## 2012-10-25 DIAGNOSIS — E118 Type 2 diabetes mellitus with unspecified complications: Secondary | ICD-10-CM

## 2012-10-25 DIAGNOSIS — R221 Localized swelling, mass and lump, neck: Secondary | ICD-10-CM

## 2012-10-25 DIAGNOSIS — I10 Essential (primary) hypertension: Secondary | ICD-10-CM

## 2012-10-25 DIAGNOSIS — R220 Localized swelling, mass and lump, head: Secondary | ICD-10-CM

## 2012-10-25 DIAGNOSIS — E785 Hyperlipidemia, unspecified: Secondary | ICD-10-CM

## 2012-10-25 DIAGNOSIS — E119 Type 2 diabetes mellitus without complications: Secondary | ICD-10-CM

## 2012-10-25 LAB — BASIC METABOLIC PANEL
BUN: 18 mg/dL (ref 6–23)
Calcium: 9.5 mg/dL (ref 8.4–10.5)
Creatinine, Ser: 0.9 mg/dL (ref 0.4–1.2)
GFR: 82.79 mL/min (ref >60.00)
Glucose, Bld: 152 mg/dL — ABNORMAL HIGH (ref 70–99)

## 2012-10-25 LAB — CBC WITH DIFFERENTIAL/PLATELET
Basophils Relative: 0.1 % (ref 0.0–3.0)
Eosinophils Relative: 1 % (ref 0.0–5.0)
Hemoglobin: 14.3 g/dL (ref 12.0–15.0)
Lymphocytes Relative: 23.5 % (ref 12.0–46.0)
Monocytes Relative: 5.2 % (ref 3.0–12.0)
Neutro Abs: 7.9 10*3/uL — ABNORMAL HIGH (ref 1.4–7.7)
RBC: 4.81 Mil/uL (ref 3.87–5.11)

## 2012-10-25 LAB — MICROALBUMIN / CREATININE URINE RATIO
Creatinine,U: 226.5 mg/dL
Microalb, Ur: 3.5 mg/dL — ABNORMAL HIGH (ref 0.0–1.9)

## 2012-10-25 LAB — HEPATIC FUNCTION PANEL
Albumin: 3.6 g/dL (ref 3.5–5.2)
Bilirubin, Direct: 0.1 mg/dL (ref 0.0–0.3)
Total Protein: 7.7 g/dL (ref 6.0–8.3)

## 2012-10-25 LAB — LIPID PANEL
Total CHOL/HDL Ratio: 6
VLDL: 34.6 mg/dL (ref 0.0–40.0)

## 2012-10-25 MED ORDER — INSULIN ASPART PROT & ASPART (70-30 MIX) 100 UNIT/ML ~~LOC~~ SUSP: 62.0000 [IU] | Freq: Every day | SUBCUTANEOUS | Status: DC

## 2012-10-25 MED ORDER — POTASSIUM CHLORIDE CRYS ER 20 MEQ PO TBCR: 20.0000 meq | EXTENDED_RELEASE_TABLET | Freq: Every day | ORAL | Status: DC

## 2012-10-25 MED ORDER — GLIMEPIRIDE 4 MG PO TABS: 4.0000 mg | ORAL_TABLET | Freq: Every day | ORAL | Status: DC

## 2012-10-25 MED ORDER — LISINOPRIL-HYDROCHLOROTHIAZIDE 20-25 MG PO TABS: 2.0000 | ORAL_TABLET | Freq: Every day | ORAL | Status: DC

## 2012-10-25 NOTE — Progress Notes (Signed)
Subjective:    Patient ID: Kimberly Frazier, female    DOB: September 07, 1960, 52 y.o.   MRN: [id number]  HPI  Pt presents to the clinic today for a 3 month f/u of hypertension, hyperlipidemia and DM2. At her last visit, her labs were elevated but she had been out of her medications for some time. She was restarted on her meds with emphasis on diet and exercise. She has been compliant with her treatment regimen. She has not had any problems since her last visit. Her blood pressure stays in the 140/90's. She denies chest pain, chest tightness or shortness of breath. She does have some concerns about a lump in her left neck. This has been there for the past month. It is tender. She is concerned because her dad had Hodgkin's lymphoma. She has not had evaluation of this lump.  Review of Systems      Past Medical History  Diagnosis Date  . Hypertension   . Diabetes mellitus   . Arthritis   . Depression   . Hyperlipidemia     Current Outpatient Prescriptions  Medication Sig Dispense Refill  . glimepiride (AMARYL) 4 MG tablet Take 1 tablet (4 mg total) by mouth daily before breakfast.  30 tablet  2  . hydrALAZINE (APRESOLINE) 50 MG tablet Take 1 tablet (50 mg total) by mouth 2 (two) times daily.  60 tablet  2  . insulin aspart protamine-insulin aspart (NOVOLOG 70/30) (70-30) 100 UNIT/ML injection Inject 62 Units into the skin daily.  20 mL  2  . Insulin Syringe-Needle U-100 (BD INSULIN SYRINGE ULTRAFINE) 31G X 5/16" 1 ML MISC Use as directed to inject insulin once daily  100 each  3  . lisinopril-hydrochlorothiazide (PRINZIDE,ZESTORETIC) 20-25 MG per tablet Take 1 tablet by mouth daily.  30 tablet  2  . Multiple Vitamins-Minerals (HAIR/SKIN/NAILS/BIOTIN) TABS Take 1 tablet by mouth daily.      . potassium chloride SA (K-DUR,KLOR-CON) 20 MEQ tablet Take 1 tablet (20 mEq total) by mouth daily.  30 tablet  3   No current facility-administered medications for this visit.    No Known Allergies  Family  History  Problem Relation Age of Onset  . Cancer Other     parent  . Diabetes Other     parent  . Heart disease Other     parent  . Hyperlipidemia Other     parent  . Hypertension Other     parent  . Arthritis Other     parent    History   Social History  . Marital Status: Single    Spouse Name: N/A    Number of Children: 2  . Years of Education: 12   Occupational History  . [employer]   Social History Main Topics  . Smoking status: Never Smoker   . Smokeless tobacco: Never Used  . Alcohol Use: Yes  . Drug Use: Yes    Special: Marijuana  . Sexually Active: No   Other Topics Concern  . Not on file   Social History Narrative   Regular exercise-no   No caffeine use     Constitutional: Denies fever, malaise, fatigue, headache or abrupt weight changes.  HEENT: Pt reports lump in neck. Denies eye pain, eye redness, ear pain, ringing in the ears, wax buildup, runny nose, nasal congestion, bloody nose, or sore throat. Respiratory: Denies difficulty breathing, shortness of breath, cough or sputum production.   Cardiovascular: Denies chest pain, chest tightness, palpitations or swelling in the hands  or feet.  Skin: Denies redness, rashes, lesions or ulcercations.  Neurological: Denies dizziness, difficulty with memory, difficulty with speech or problems with balance and coordination.   No other specific complaints in a complete review of systems (except as listed in HPI above).  Objective:   Physical Exam  BP 142/86  Pulse 96  Temp(Src) 98 F (36.7 C) (Oral)  Ht 5\' 4"  (1.626 m)  Wt 275 lb (124.739 kg)  BMI 47.18 kg/m2  SpO2 96% Wt Readings from Last 3 Encounters:  10/25/12 275 lb (124.739 kg)  07/26/12 277 lb 6.4 oz (125.828 kg)    General: Appears her stated age, obese but well developed, well nourished in NAD. HEENT: Head: normal shape and size; Eyes: sclera white, no icterus, conjunctiva pink, PERRLA and EOMs intact; Ears: Tm's gray and intact, normal  light reflex; Nose: mucosa pink and moist, septum midline; Throat/Mouth: Teeth present, mucosa pink and moist, no exudate, lesions or ulcerations noted.  Neck: Normal range of motion. Neck supple, trachea midline. Thyromegaly noted. Obvious lump of left neck, no palpable lymph node. Cardiovascular: Normal rate and rhythm. S1,S2 noted.  No murmur, rubs or gallops noted. No JVD or BLE edema. No carotid bruits noted. Pulmonary/Chest: Normal effort and positive vesicular breath sounds. No respiratory distress. No wheezes, rales or ronchi noted.  Neurological: Alert and oriented. Cranial nerves II-XII intact. Coordination normal. +DTRs bilaterally.   BMET    Component Value Date/Time   NA 138 07/26/2012 1125   K 3.3* 07/26/2012 1125   CL 100 07/26/2012 1125   CO2 30 07/26/2012 1125   GLUCOSE 173* 07/26/2012 1125   BUN 15 07/26/2012 1125   CREATININE 0.8 07/26/2012 1125   CALCIUM 9.2 07/26/2012 1125   GFRNONAA >90 07/26/2011 1030   GFRAA >90 07/26/2011 1030    Lipid Panel     Component Value Date/Time   CHOL 186 07/26/2012 1125   TRIG 170.0* 07/26/2012 1125   HDL 32.50* 07/26/2012 1125   CHOLHDL 6 07/26/2012 1125   VLDL 34.0 07/26/2012 1125   LDLCALC 120* 07/26/2012 1125    CBC    Component Value Date/Time   WBC 11.1* 07/26/2012 1125   RBC 4.65 07/26/2012 1125   HGB 13.7 07/26/2012 1125   HCT 41.0 07/26/2012 1125   PLT 224.0 07/26/2012 1125   MCV 88.2 07/26/2012 1125   MCH 29.7 07/26/2011 1030   MCHC 33.4 07/26/2012 1125   RDW 14.6 07/26/2012 1125    Hgb A1C Lab Results  Component Value Date   HGBA1C 7.9* 07/26/2012         Assessment & Plan:   Lump, left neck:  Will check [place] of soft tissue We will call you when that is set up

## 2012-10-25 NOTE — Assessment & Plan Note (Signed)
Not well controlled Encouraged pt to work on diet and exercise Increase lisinopril-hctz to 2 tabs per day Meds refilled today Will check labs today

## 2012-10-25 NOTE — Assessment & Plan Note (Signed)
Encouraged pt to work on diet and exercise Will recheck lipid profile today If still elevated, will start statin and ASA therapy

## 2012-10-25 NOTE — Assessment & Plan Note (Signed)
Will check A1C today Foot exam performed today Will check lipids as well Continue current meds at this time May adjust based on labs Continue to work on diet and exercise All meds refilled today

## 2012-10-25 NOTE — Patient Instructions (Signed)

## 2012-10-27 ENCOUNTER — Ambulatory Visit
Admission: RE | Admit: 2012-10-27 | Discharge: 2012-10-27 | Disposition: A | Discharge status: Home / Self Care | Payer: 59 | Source: Ambulatory Visit | Attending: Internal Medicine | Admitting: Internal Medicine

## 2012-10-27 ENCOUNTER — Other Ambulatory Visit: Payer: Self-pay | Admitting: Internal Medicine

## 2012-10-27 DIAGNOSIS — R221 Localized swelling, mass and lump, neck: Secondary | ICD-10-CM

## 2012-10-27 MED ORDER — POTASSIUM CHLORIDE CRYS ER 20 MEQ PO TBCR: 20.0000 meq | EXTENDED_RELEASE_TABLET | Freq: Two times a day (BID) | ORAL | Status: DC

## 2012-10-27 MED ORDER — SIMVASTATIN 10 MG PO TABS: 10.0000 mg | ORAL_TABLET | Freq: Every day | ORAL | Status: DC

## 2012-10-30 ENCOUNTER — Other Ambulatory Visit: Payer: Self-pay | Admitting: Internal Medicine

## 2012-10-30 MED ORDER — SITAGLIP PHOS-METFORMIN HCL ER 50-1000 MG PO TB24: 1.0000 | ORAL_TABLET | Freq: Every day | ORAL | Status: DC

## 2012-11-01 ENCOUNTER — Ambulatory Visit (INDEPENDENT_AMBULATORY_CARE_PROVIDER_SITE_OTHER)
Admission: RE | Admit: 2012-11-01 | Discharge: 2012-11-01 | Disposition: A | Discharge status: Home / Self Care | Payer: 59 | Source: Ambulatory Visit | Attending: Internal Medicine | Admitting: Internal Medicine

## 2012-11-01 DIAGNOSIS — R220 Localized swelling, mass and lump, head: Secondary | ICD-10-CM

## 2012-11-01 DIAGNOSIS — R221 Localized swelling, mass and lump, neck: Secondary | ICD-10-CM

## 2012-11-01 MED ORDER — IOHEXOL 300 MG/ML  SOLN
75.0000 mL | Freq: Once | INTRAMUSCULAR | Status: AC | PRN
  Administered 2012-11-01: 75 mL via INTRAVENOUS

## 2012-11-02 ENCOUNTER — Telehealth: Payer: Self-pay | Admitting: *Deleted

## 2012-11-02 NOTE — Telephone Encounter (Signed)
See result note pt has been notified with u/s results...Raechel Chute

## 2012-11-02 NOTE — Telephone Encounter (Signed)
Left msg on triage requesting test results from yesterday...lmb

## 2013-01-16 ENCOUNTER — Ambulatory Visit (INDEPENDENT_AMBULATORY_CARE_PROVIDER_SITE_OTHER): Payer: 59 | Admitting: Internal Medicine

## 2013-01-16 ENCOUNTER — Encounter: Payer: Self-pay | Admitting: Internal Medicine

## 2013-01-16 ENCOUNTER — Other Ambulatory Visit (INDEPENDENT_AMBULATORY_CARE_PROVIDER_SITE_OTHER): Payer: 59

## 2013-01-16 VITALS — BP 118/80 | HR 93 | Temp 98.0°F | Resp 12 | Wt 276.0 lb

## 2013-01-16 DIAGNOSIS — E785 Hyperlipidemia, unspecified: Secondary | ICD-10-CM

## 2013-01-16 DIAGNOSIS — E119 Type 2 diabetes mellitus without complications: Secondary | ICD-10-CM

## 2013-01-16 DIAGNOSIS — I10 Essential (primary) hypertension: Secondary | ICD-10-CM

## 2013-01-16 DIAGNOSIS — R252 Cramp and spasm: Secondary | ICD-10-CM

## 2013-01-16 LAB — COMPREHENSIVE METABOLIC PANEL
Alkaline Phosphatase: 87 U/L (ref 39–117)
Glucose, Bld: 175 mg/dL — ABNORMAL HIGH (ref 70–99)
Sodium: 135 mEq/L (ref 135–145)
Total Bilirubin: 0.6 mg/dL (ref 0.3–1.2)
Total Protein: 8.1 g/dL (ref 6.0–8.3)

## 2013-01-16 LAB — LIPID PANEL
Cholesterol: 204 mg/dL — ABNORMAL HIGH (ref 0–200)
HDL: 35 mg/dL — ABNORMAL LOW (ref >39.00)
Total CHOL/HDL Ratio: 6
Triglycerides: 225 mg/dL — ABNORMAL HIGH (ref 0.0–149.0)
VLDL: 45 mg/dL — ABNORMAL HIGH (ref 0.0–40.0)

## 2013-01-16 LAB — CBC
MCHC: 33.5 g/dL (ref 30.0–36.0)
RDW: 13.6 % (ref 11.5–14.6)
WBC: 11.3 10*3/uL — ABNORMAL HIGH (ref 4.5–10.5)

## 2013-01-16 LAB — MICROALBUMIN / CREATININE URINE RATIO: Creatinine,U: 125.4 mg/dL

## 2013-01-16 NOTE — Assessment & Plan Note (Signed)
Will recheck A1C and microalbumin today Foot exam performed today Continue to work on diet and exercise Insulin refilled today

## 2013-01-16 NOTE — Patient Instructions (Signed)
1200 Calorie Diabetic Diet The 1200 calorie diabetic diet limits calories to 1200 each day. Following this diet and making healthy meal choices can help improve overall health. It controls blood glucose (sugar) levels and can also help lower blood pressure and cholesterol.  SERVING SIZES Measuring foods and serving sizes helps to make sure you are getting the right amount of food. The list below tells how big or small some common serving sizes are.   1 oz.........4 stacked dice.  3 oz.........Deck of cards.  1 tsp........Tip of little finger.  1 tbs........Thumb.  2 tbs........Golf ball.   cup.......Half of a fist.  1 cup........A fist. GUIDELINES FOR CHOOSING FOODS The goal of this diet is to eat a variety of foods and limit calories to 1200 each day. This can be done by choosing foods that are low in calories and fat. The diet also suggests eating small amounts of food frequently. Doing this helps control your blood glucose levels, so they do not get too high or too low. Each meal or snack may include a protein food source to help you feel more satisfied. Try to eat about the same amount of food around the same time each day. This includes weekend days, travel days, and days off work. Space your meals about 4 to 5 hours apart, and add a snack between them, if you wish.  For example, a daily food plan could include breakfast, a morning snack, lunch, dinner, and an evening snack. Healthy meals and snacks have different types of foods, including whole grains, vegetables, fruits, lean meats, poultry, fish, and dairy products. As you plan your meals, select a variety of foods. Choose from the bread and starch, vegetable, fruit, dairy, and meat/protein groups. Examples of foods from each group are listed below, with their suggested serving sizes. Use measuring cups and spoons to become familiar with what a healthy portion looks like. Bread and Starch Each serving equals 15 grams of  carbohydrate.  1 slice bread.   bagel.   cup cold cereal (unsweetened).   cup hot cereal or mashed potatoes.  1 small potato (size of a computer mouse).   cup cooked pasta or rice.   English muffin.  1 cup broth-based soup.  3 cups of popcorn.  4 to 6 whole-wheat crackers.   cup cooked beans, peas, or corn. Vegetables Each serving equals 5 grams of carbohydrate.   cup cooked vegetables.  1 cup raw vegetables.   cup tomato or vegetable juice. Fruit Each serving equals 15 grams of carbohydrate.  1 small apple or orange.  1  cup watermelon or strawberries.   cup applesauce (no sugar added).  2 tbs raisins.   banana.   cup canned fruit, packed in water or in its own juice.   cup unsweetened fruit juice. Dairy Each serving equals 12 to 15 grams of carbohydrate.  1 cup fat-free milk.  6 oz artificially sweetened yogurt or plain yogurt.  1 cup low-fat buttermilk.  1 cup soy milk.  1 cup almond milk. Meat/Protein  1 large egg.  2 to 3 oz meat, poultry, or fish.   cup low-fat cottage cheese.  1 tbs peanut butter.  1 oz low-fat cheese.   cup tuna, packed in water.   cup tofu. Fat  1 tsp oil.  1 tsp trans-fat-free margarine.  1 tsp butter.  1 tsp mayonnaise.  2 tbs avocado.  1 tbs salad dressing.  1 tbs cream cheese.  2 tbs sour cream. SAMPLE 1200 CALORIE   DIET PLAN Breakfast  1 cup fat-free milk (1 carb serving).  1 small orange (1 carb serving).  1 scrambled egg.  1 slice whole-wheat toast (1 carb serving). Lunch  Sandwich  2 slices whole-wheat bread (2 carb servings).  2 oz lean meat.  2 tsp reduced fat mayonnaise.  1 lettuce leaf.  2 slices tomato.  1 cup carrot sticks. Afternoon Snack  1 small apple (1 carb serving).  1 string cheese. Dinner  2 oz meat.  1 small baked potato (1 carb serving).  1 tsp trans-fat-free margarine.  1 cup steamed broccoli.  1 cup fat-free milk (1 carb  serving). Evening Snack   small banana (1 carb serving).  6 vanilla wafers (1 carb serving). MEAL PLAN You can use this worksheet to help you make a daily meal plan based on the 1200 calorie diabetic diet suggestions. If you are using this plan to help you control your blood glucose, you may interchange carbohydrate containing foods (dairy, starches, and fruits). Select a variety of fresh foods of varying colors and flavors. The total amount of carbohydrate in your meals or snacks is more important than making sure you include all of the food groups every time you eat. You can choose from approximately this many of the following foods to build your day's meals:  6 Starches.  3 Vegetables.  2 Fruits.  2 Dairy.  4 to 6 oz Meat/Protein.  Up to 3 Fats. Your dietician can use this worksheet to help you decide how many servings and which types of foods are right for you. BREAKFAST Food Group and Servings / Food Choice Starch _________________________________________________________ Dairy __________________________________________________________ Fruit ___________________________________________________________ Meat/Protein____________________________________________________ Fat ____________________________________________________________ LUNCH Food Group and Servings / Food Choice  Starch _________________________________________________________ Meat/Protein ___________________________________________________ Vegetables _____________________________________________________ Fruit __________________________________________________________ Dairy __________________________________________________________ Fat ____________________________________________________________ AFTERNOON SNACK Food Group and Servings / Food Choice Dairy __________________________________________________________ Fruit ___________________________________________________________ Starch  __________________________________________________________ Meat/Protein____________________________________________________ DINNER Food Group and Servings / Food Choice Starch _________________________________________________________ Meat/Protein ___________________________________________________ Dairy __________________________________________________________ Vegetables _____________________________________________________ Fruit __________________________________________________________ Fat ____________________________________________________________ EVENING SNACK Food Group and Servings / Food Choice Fruit ___________________________________________________________ Meat/Protein ____________________________________________________ Dairy __________________________________________________________ Starch __________________________________________________________ DAILY TOTALS Starches _________________________ Vegetables _______________________ Fruits ____________________________ Dairy ____________________________ Meat/Protein______________________ Fats _____________________________ Document Released: 12/07/2004 Document Revised: 08/09/2011 Document Reviewed: 04/03/2009 [employer] Patient Information 2014 [employer]  

## 2013-01-16 NOTE — Assessment & Plan Note (Signed)
Well controlled Continue current therapy 

## 2013-01-16 NOTE — Assessment & Plan Note (Signed)
Well controlled Continue current therapy Will recheck lipid profile today Will check CK And LFT as pt is experiencing leg cramps.

## 2013-01-16 NOTE — Progress Notes (Signed)
Subjective:    Patient ID: Kimberly Frazier, female    DOB: Feb 06, 1961, 52 y.o.   MRN: [id number]  HPI  Pt presents to the clinic today for 3 month f/u of chronic medical conditions:  HTN: Well controlled on current meds. Denies chest pain, chest tightness or shortness of breath.   HLD: Working on diet and exercise. Tolerating Zocor without side effects. Is having muscle cramps for the past 3 weeks.   DM2: On amaryl, metformin and sitagliptin. Does not test sugars. Last A1C 6.7 in 09/2012. She has been out of her insulin for the past 7 days.   Review of Systems      Past Medical History  Diagnosis Date  . Hypertension   . Diabetes mellitus   . Arthritis   . Depression   . Hyperlipidemia     Current Outpatient Prescriptions  Medication Sig Dispense Refill  . glimepiride (AMARYL) 4 MG tablet Take 1 tablet (4 mg total) by mouth daily before breakfast.  30 tablet  2  . hydrALAZINE (APRESOLINE) 50 MG tablet Take 1 tablet (50 mg total) by mouth 2 (two) times daily.  60 tablet  2  . insulin aspart protamine- aspart (NOVOLOG 70/30) (70-30) 100 UNIT/ML injection Inject 0.62 mLs (62 Units total) into the skin daily.  30 mL  2  . Insulin Syringe-Needle U-100 (BD INSULIN SYRINGE ULTRAFINE) 31G X 5/16" 1 ML MISC Use as directed to inject insulin once daily  100 each  3  . lisinopril-hydrochlorothiazide (PRINZIDE,ZESTORETIC) 20-25 MG per tablet Take 2 tablets by mouth daily.  60 tablet  2  . Multiple Vitamins-Minerals (HAIR/SKIN/NAILS/BIOTIN) TABS Take 1 tablet by mouth daily.      . potassium chloride SA (K-DUR,KLOR-CON) 20 MEQ tablet Take 1 tablet (20 mEq total) by mouth 2 (two) times daily.  60 tablet  3  . simvastatin (ZOCOR) 10 MG tablet Take 1 tablet (10 mg total) by mouth at bedtime.  90 tablet  3  . SitaGLIPtin-MetFORMIN HCl 50-1000 MG TB24 Take 1 tablet by mouth daily.  30 tablet  2   No current facility-administered medications for this visit.    No Known Allergies  Family History   Problem Relation Age of Onset  . Cancer Other     parent  . Diabetes Other     parent  . Heart disease Other     parent  . Hyperlipidemia Other     parent  . Hypertension Other     parent  . Arthritis Other     parent    History   Social History  . Marital Status: Single    Spouse Name: N/A    Number of Children: 2  . Years of Education: 12   Occupational History  . [employer]   Social History Main Topics  . Smoking status: Never Smoker   . Smokeless tobacco: Never Used  . Alcohol Use: Yes  . Drug Use: Yes    Special: Marijuana  . Sexual Activity: No   Other Topics Concern  . Not on file   Social History Narrative   Regular exercise-no   No caffeine use     Constitutional: Denies fever, malaise, fatigue, headache or abrupt weight changes.  HEENT: Denies eye pain, eye redness, ear pain, ringing in the ears, wax buildup, runny nose, nasal congestion, bloody nose, or sore throat. Respiratory: Denies difficulty breathing, shortness of breath, cough or sputum production.   Cardiovascular: Denies chest pain, chest tightness, palpitations or swelling in the  hands or feet.  Gastrointestinal: Denies abdominal pain, bloating, constipation, diarrhea or blood in the stool.  GU: Denies urgency, frequency, pain with urination, burning sensation, blood in urine, odor or discharge. Musculoskeletal: Pt reports leg cramps. Denies decrease in range of motion, difficulty with gait,  or joint pain and swelling.  Skin: Denies redness, rashes, lesions or ulcercations.  Neurological: Denies dizziness, difficulty with memory, difficulty with speech or problems with balance and coordination.   No other specific complaints in a complete review of systems (except as listed in HPI above).  Objective:   Physical Exam   BP 118/80  Pulse 93  Temp(Src) 98 F (36.7 C) (Oral)  Resp 12  Wt 276 lb (125.193 kg)  BMI 47.35 kg/m2  SpO2 97% Wt Readings from Last 3 Encounters:   01/16/13 276 lb (125.193 kg)  10/25/12 275 lb (124.739 kg)  07/26/12 277 lb 6.4 oz (125.828 kg)    General: Appears her stated age, obese but well developed, well nourished in NAD. Skin: Warm, dry and intact. No rashes, lesions or ulcerations noted. HEENT: Head: normal shape and size; Eyes: sclera white, no icterus, conjunctiva pink, PERRLA and EOMs intact; Ears: Tm's gray and intact, normal light reflex; Nose: mucosa pink and moist, septum midline; Throat/Mouth: Teeth present, mucosa pink and moist, no exudate, lesions or ulcerations noted.  Neck: Normal range of motion. Neck supple, trachea midline. No massses, lumps or thyromegaly present.  Cardiovascular: Normal rate and rhythm. S1,S2 noted.  No murmur, rubs or gallops noted. No JVD or BLE edema. No carotid bruits noted. Pulmonary/Chest: Normal effort and positive vesicular breath sounds. No respiratory distress. No wheezes, rales or ronchi noted.  Abdomen: Soft and nontender. Normal bowel sounds, no bruits noted. No distention or masses noted. Liver, spleen and kidneys non palpable. Musculoskeletal: Normal range of motion. No signs of joint swelling. No difficulty with gait.  Neurological: Alert and oriented. Cranial nerves II-XII intact. Coordination normal. +DTRs bilaterally. Psychiatric: Mood and affect normal. Behavior is normal. Judgment and thought content normal.     BMET    Component Value Date/Time   NA 138 10/25/2012 1019   K 3.2* 10/25/2012 1019   CL 100 10/25/2012 1019   CO2 32 10/25/2012 1019   GLUCOSE 152* 10/25/2012 1019   BUN 18 10/25/2012 1019   CREATININE 0.9 10/25/2012 1019   CALCIUM 9.5 10/25/2012 1019   GFRNONAA >90 07/26/2011 1030   GFRAA >90 07/26/2011 1030    Lipid Panel     Component Value Date/Time   CHOL 202* 10/25/2012 1019   TRIG 173.0* 10/25/2012 1019   HDL 36.60* 10/25/2012 1019   CHOLHDL 6 10/25/2012 1019   VLDL 34.6 10/25/2012 1019   LDLCALC 120* 07/26/2012 1125    CBC    Component Value Date/Time    WBC 11.2* 10/25/2012 1019   RBC 4.81 10/25/2012 1019   HGB 14.3 10/25/2012 1019   HCT 42.5 10/25/2012 1019   PLT 203.0 10/25/2012 1019   MCV 88.3 10/25/2012 1019   MCH 29.7 07/26/2011 1030   MCHC 33.7 10/25/2012 1019   RDW 14.4 10/25/2012 1019   LYMPHSABS 2.6 10/25/2012 1019   MONOABS 0.6 10/25/2012 1019   EOSABS 0.1 10/25/2012 1019   BASOSABS 0.0 10/25/2012 1019    Hgb A1C Lab Results  Component Value Date   HGBA1C 9.4* 10/25/2012        Assessment & Plan:

## 2013-01-18 ENCOUNTER — Encounter: Payer: Self-pay | Admitting: *Deleted

## 2013-01-18 ENCOUNTER — Telehealth: Payer: Self-pay | Admitting: *Deleted

## 2013-01-18 NOTE — Telephone Encounter (Signed)
Tried to call pt several times. Left 2 voice messages for pt to return call. Dictated a letter with results and note from [facility] and sent to pt today.

## 2013-04-25 ENCOUNTER — Other Ambulatory Visit: Payer: Self-pay | Admitting: Internal Medicine

## 2013-06-12 ENCOUNTER — Other Ambulatory Visit: Payer: Self-pay | Admitting: Internal Medicine

## 2013-06-26 ENCOUNTER — Other Ambulatory Visit: Payer: Self-pay | Admitting: Internal Medicine

## 2013-06-26 NOTE — Telephone Encounter (Signed)
Last filled 10/25/12 with 2 refills--please advise

## 2013-08-01 ENCOUNTER — Ambulatory Visit: Payer: Self-pay | Admitting: Obstetrics

## 2013-08-03 ENCOUNTER — Other Ambulatory Visit: Payer: Self-pay | Admitting: Internal Medicine

## 2013-09-20 ENCOUNTER — Ambulatory Visit (INDEPENDENT_AMBULATORY_CARE_PROVIDER_SITE_OTHER): Payer: 59 | Admitting: Internal Medicine

## 2013-09-20 ENCOUNTER — Encounter: Payer: Self-pay | Admitting: Internal Medicine

## 2013-09-20 VITALS — BP 156/110 | HR 90 | Temp 98.5°F | Ht 64.25 in | Wt 272.0 lb

## 2013-09-20 DIAGNOSIS — I10 Essential (primary) hypertension: Secondary | ICD-10-CM

## 2013-09-20 DIAGNOSIS — R61 Generalized hyperhidrosis: Secondary | ICD-10-CM

## 2013-09-20 DIAGNOSIS — E118 Type 2 diabetes mellitus with unspecified complications: Secondary | ICD-10-CM

## 2013-09-20 DIAGNOSIS — Z0000 Encounter for general adult medical examination without abnormal findings: Secondary | ICD-10-CM

## 2013-09-20 DIAGNOSIS — L74519 Primary focal hyperhidrosis, unspecified: Secondary | ICD-10-CM

## 2013-09-20 DIAGNOSIS — E785 Hyperlipidemia, unspecified: Secondary | ICD-10-CM

## 2013-09-20 MED ORDER — ALUMINUM CHLORIDE 20 % EX SOLN: Freq: Every day | CUTANEOUS | Status: DC

## 2013-09-20 MED ORDER — LISINOPRIL-HYDROCHLOROTHIAZIDE 20-25 MG PO TABS: 2.0000 | ORAL_TABLET | Freq: Every day | ORAL | Status: DC

## 2013-09-20 MED ORDER — INSULIN SYRINGE-NEEDLE U-100 31G X 5/16" 1 ML MISC
Status: DC
Start: 2013-09-20 — End: 2015-04-09

## 2013-09-20 MED ORDER — GLIMEPIRIDE 4 MG PO TABS: 4.0000 mg | ORAL_TABLET | Freq: Every day | ORAL | Status: DC

## 2013-09-20 MED ORDER — INSULIN ASPART PROT & ASPART (70-30 MIX) 100 UNIT/ML ~~LOC~~ SUSP
62.0000 [IU] | Freq: Two times a day (BID) | SUBCUTANEOUS | Status: DC
Start: 2013-09-20 — End: 2013-10-31

## 2013-09-20 MED ORDER — SITAGLIP PHOS-METFORMIN HCL ER 50-1000 MG PO TB24
1.0000 | ORAL_TABLET | Freq: Every day | ORAL | Status: DC
Start: 2013-09-20 — End: 2014-07-16

## 2013-09-20 NOTE — Assessment & Plan Note (Signed)
Start drysol

## 2013-09-20 NOTE — Progress Notes (Signed)
Subjective:    Patient ID: Kimberly Frazier, female    DOB: 1961-02-28, 53 y.o.   MRN: [id number]  HPI  Pt presents to the clinic today for her annual exam. She does c/o excessive underarm sweat. She uses [employer]. She would like to know what other options are available.  Flu: never Tetanus: 2014 Pneumovax: 2014 Pap Smear: 07/2012 Mammogram: 07/2012 Colon Screening: 2012? Eye Doctor: yearly Dentist: as needed   HTN: BP elevated today on Hydralazine, Lisinopril and HCT. She does take a potassium supplement as well.  HLD: Was experiencing myalgias on Zocor. LFT's was normal and CK was elevated. We had her stop her statin and she is taking fish oil instead.   DM2: Last A1C 9.3% on Amaryl, Sitagliptin, Metformin and Humalog. She does not check her sugars because she has ran out of supplies. She has lost 4 lbs but is overall compliant with her diet. She does not exercise.   Review of Systems      Past Medical History  Diagnosis Date  . Hypertension   . Diabetes mellitus   . Arthritis   . Depression   . Hyperlipidemia     Current Outpatient Prescriptions  Medication Sig Dispense Refill  . glimepiride (AMARYL) 4 MG tablet TAKE ONE TABLET BY MOUTH ONE TIME DAILY before breakfast  30 tablet  0  . hydrALAZINE (APRESOLINE) 50 MG tablet Take 1 tablet (50 mg total) by mouth 2 (two) times daily.  60 tablet  2  . Insulin Syringe-Needle U-100 (BD INSULIN SYRINGE ULTRAFINE) 31G X 5/16" 1 ML MISC Use as directed to inject insulin once daily  100 each  3  . lisinopril-hydrochlorothiazide (PRINZIDE,ZESTORETIC) 20-25 MG per tablet Take 2 tablets by mouth daily.  60 tablet  1  . Multiple Vitamins-Minerals (HAIR/SKIN/NAILS/BIOTIN) TABS Take 1 tablet by mouth daily.      Marland Kitchen NOVOLOG MIX 70/30 (70-30) 100 UNIT/ML injection Inject 0.62 mls (62 units total) into the skin daily.  10 mL  1  . potassium chloride SA (K-DUR,KLOR-CON) 20 MEQ tablet Take 1 tablet (20 mEq total) by mouth 2 (two)  times daily.  60 tablet  3  . simvastatin (ZOCOR) 10 MG tablet Take 1 tablet (10 mg total) by mouth at bedtime.  90 tablet  3  . SitaGLIPtin-MetFORMIN HCl 50-1000 MG TB24 Take 1 tablet by mouth daily.  30 tablet  2   No current facility-administered medications for this visit.    No Known Allergies  Family History  Problem Relation Age of Onset  . Cancer Other     parent  . Diabetes Other     parent  . Heart disease Other     parent  . Hyperlipidemia Other     parent  . Hypertension Other     parent  . Arthritis Other     parent  . Heart disease Mother   . Hypertension Mother   . Diabetes Mother   . Cancer Father     hodgkins lymphoma  . Heart disease Sister     heart attack    History   Social History  . Marital Status: Single    Spouse Name: N/A    Number of Children: 2  . Years of Education: 12   Occupational History  . [employer]   Social History Main Topics  . Smoking status: Never Smoker   . Smokeless tobacco: Never Used  . Alcohol Use: Yes  . Drug Use: Yes    Special:  Marijuana  . Sexual Activity: No   Other Topics Concern  . Not on file   Social History Narrative   Regular exercise-no   No caffeine use     Constitutional: Denies fever, malaise, fatigue, headache or abrupt weight changes.  HEENT: Denies eye pain, eye redness, ear pain, ringing in the ears, wax buildup, runny nose, nasal congestion, bloody nose, or sore throat. Respiratory: Denies difficulty breathing, shortness of breath, cough or sputum production.   Cardiovascular: Denies chest pain, chest tightness, palpitations or swelling in the hands or feet.  Gastrointestinal: Pt reports diarrhea. Denies abdominal pain, bloating, constipation,  or blood in the stool.  GU: Denies urgency, frequency, pain with urination, burning sensation, blood in urine, odor or discharge. Musculoskeletal: Denies decrease in range of motion, difficulty with gait, muscle pain or joint pain and swelling.   Skin: Denies redness, rashes, lesions or ulcercations.  Neurological: Denies dizziness, difficulty with memory, difficulty with speech or problems with balance and coordination.   No other specific complaints in a complete review of systems (except as listed in HPI above).  Objective:   Physical Exam   BP 156/110  Pulse 90  Temp(Src) 98.5 F (36.9 C) (Oral)  Ht 5' 4.25" (1.632 m)  Wt 272 lb (123.378 kg)  BMI 46.32 kg/m2  SpO2 97% Wt Readings from Last 3 Encounters:  09/20/13 272 lb (123.378 kg)  01/16/13 276 lb (125.193 kg)  10/25/12 275 lb (124.739 kg)    General: Appears her stated age, obese but well developed, well nourished in NAD. Skin: Warm, dry and intact. No rashes, lesions or ulcerations noted. Toenail fungus noted.  HEENT: Head: normal shape and size; Eyes: sclera white, no icterus, conjunctiva pink, PERRLA and EOMs intact; Ears: Tm's gray and intact, normal light reflex; Nose: mucosa pink and moist, septum midline; Throat/Mouth: Teeth present, mucosa pink and moist, no exudate, lesions or ulcerations noted.  Neck: Normal range of motion. Neck supple, trachea midline. No massses, lumps or thyromegaly present.  Cardiovascular: Normal rate and rhythm. S1,S2 noted.  No murmur, rubs or gallops noted. No  BLE edema. JVD noted. No carotid bruits noted. Pulmonary/Chest: Normal effort and positive vesicular breath sounds. No respiratory distress. No wheezes, rales or ronchi noted.  Abdomen: Soft and nontender. Normal bowel sounds, no bruits noted. No distention or masses noted. Liver, spleen and kidneys non palpable. Musculoskeletal: Normal range of motion. No signs of joint swelling. No difficulty with gait.  Neurological: Alert and oriented. Some decreased sensation with 10 gm monofilament.  Coordination normal. +DTRs bilaterally. Psychiatric: Mood and affect normal. Behavior is normal. Judgment and thought content normal.     BMET    Component Value Date/Time   NA 135  01/16/2013 1449   K 3.3* 01/16/2013 1449   CL 99 01/16/2013 1449   CO2 30 01/16/2013 1449   GLUCOSE 175* 01/16/2013 1449   BUN 23 01/16/2013 1449   CREATININE 1.0 01/16/2013 1449   CALCIUM 9.6 01/16/2013 1449   GFRNONAA >90 07/26/2011 1030   GFRAA >90 07/26/2011 1030    Lipid Panel     Component Value Date/Time   CHOL 204* 01/16/2013 1449   TRIG 225.0* 01/16/2013 1449   HDL 35.00* 01/16/2013 1449   CHOLHDL 6 01/16/2013 1449   VLDL 45.0* 01/16/2013 1449   LDLCALC 120* 07/26/2012 1125    CBC    Component Value Date/Time   WBC 11.3* 01/16/2013 1449   RBC 4.67 01/16/2013 1449   HGB 13.9 01/16/2013 1449  HCT 41.4 01/16/2013 1449   PLT 230.0 01/16/2013 1449   MCV 88.8 01/16/2013 1449   MCH 29.7 07/26/2011 1030   MCHC 33.5 01/16/2013 1449   RDW 13.6 01/16/2013 1449   LYMPHSABS 2.6 10/25/2012 1019   MONOABS 0.6 10/25/2012 1019   EOSABS 0.1 10/25/2012 1019   BASOSABS 0.0 10/25/2012 1019    Hgb A1C Lab Results  Component Value Date   HGBA1C 9.5* 01/16/2013        Assessment & Plan:   Preventative Health Maintenance:  All HM UTD

## 2013-09-20 NOTE — Assessment & Plan Note (Signed)
Encouraged her to work on diet and exercise 

## 2013-09-20 NOTE — Progress Notes (Signed)
Pre visit review using our clinic review tool, if applicable. No additional management support is needed unless otherwise documented below in the visit note. 

## 2013-09-20 NOTE — Patient Instructions (Addendum)
Diabetes and Exercise Exercising regularly is important. It is not just about losing weight. It has many health benefits, such as:  Improving your overall fitness, flexibility, and endurance.  Increasing your bone density.  Helping with weight control.  Decreasing your body fat.  Increasing your muscle strength.  Reducing stress and tension.  Improving your overall health. People with diabetes who exercise gain additional benefits because exercise:  Reduces appetite.  Improves the body's use of blood sugar (glucose).  Helps lower or control blood glucose.  Decreases blood pressure.  Helps control blood lipids (such as cholesterol and triglycerides).  Improves the body's use of the hormone insulin by:  Increasing the body's insulin sensitivity.  Reducing the body's insulin needs.  Decreases the risk for heart disease because exercising:  Lowers cholesterol and triglycerides levels.  Increases the levels of good cholesterol (such as high-density lipoproteins [HDL]) in the body.  Lowers blood glucose levels. YOUR ACTIVITY PLAN  Choose an activity that you enjoy and set realistic goals. Your health care provider or diabetes educator can help you make an activity plan that works for you. You can break activities into 2 or 3 sessions throughout the day. Doing so is as good as one long session. Exercise ideas include:  Taking the dog for a walk.  Taking the stairs instead of the elevator.  Dancing to your favorite song.  Doing your favorite exercise with a friend. RECOMMENDATIONS FOR EXERCISING WITH TYPE 1 OR TYPE 2 DIABETES   Check your blood glucose before exercising. If blood glucose levels are greater than 240 mg/dL, check for urine ketones. Do not exercise if ketones are present.  Avoid injecting insulin into areas of the body that are going to be exercised. For example, avoid injecting insulin into:  The arms when playing tennis.  The legs when  jogging.  Keep a record of:  Food intake before and after you exercise.  Expected peak times of insulin action.  Blood glucose levels before and after you exercise.  The type and amount of exercise you have done.  Review your records with your health care provider. Your health care provider will help you to develop guidelines for adjusting food intake and insulin amounts before and after exercising.  If you take insulin or oral hypoglycemic agents, watch for signs and symptoms of hypoglycemia. They include:  Dizziness.  Shaking.  Sweating.  Chills.  Confusion.  Drink plenty of water while you exercise to prevent dehydration or heat stroke. Body water is lost during exercise and must be replaced.  Talk to your health care provider before starting an exercise program to make sure it is safe for you. Remember, almost any type of activity is better than none. Document Released: 08/07/2003 Document Revised: 01/17/2013 Document Reviewed: 10/24/2012 ExitCare Patient Information 2014 [employer]. Diabetes Meal Planning Guide The diabetes meal planning guide is a tool to help you plan your meals and snacks. It is important for people with diabetes to manage their blood glucose (sugar) levels. Choosing the right foods and the right amounts throughout your day will help control your blood glucose. Eating right can even help you improve your blood pressure and reach or maintain a healthy weight. CARBOHYDRATE COUNTING MADE EASY When you eat carbohydrates, they turn to sugar. This raises your blood glucose level. Counting carbohydrates can help you control this level so you feel better. When you plan your meals by counting carbohydrates, you can have more flexibility in what you eat and balance your medicine   with your food intake. Carbohydrate counting simply means adding up the total amount of carbohydrate grams in your meals and snacks. Try to eat about the same amount at each meal. Foods  with carbohydrates are listed below. Each portion below is 1 carbohydrate serving or 15 grams of carbohydrates. Ask your dietician how many grams of carbohydrates you should eat at each meal or snack. Grains and Starches  1 slice bread.   English muffin or hotdog/hamburger bun.   cup cold cereal (unsweetened).   cup cooked pasta or rice.   cup starchy vegetables (corn, potatoes, peas, beans, winter squash).  1 tortilla (6 inches).   bagel.  1 waffle or pancake (size of a CD).   cup cooked cereal.  4 to 6 small crackers. *Whole grain is recommended. Fruit  1 cup fresh unsweetened berries, melon, papaya, pineapple.  1 small fresh fruit.   banana or mango.   cup fruit juice (4 oz unsweetened).   cup canned fruit in natural juice or water.  2 tbs dried fruit.  12 to 15 grapes or cherries. Milk and Yogurt  1 cup fat-free or 1% milk.  1 cup soy milk.  6 oz light yogurt with sugar-free sweetener.  6 oz low-fat soy yogurt.  6 oz plain yogurt. Vegetables  1 cup raw or  cup cooked is counted as 0 carbohydrates or a "free" food.  If you eat 3 or more servings at 1 meal, count them as 1 carbohydrate serving. Other Carbohydrates   oz chips or pretzels.   cup ice cream or frozen yogurt.   cup sherbet or sorbet.  2 inch square cake, no frosting.  1 tbs honey, sugar, jam, jelly, or syrup.  2 small cookies.  3 squares of graham crackers.  3 cups popcorn.  6 crackers.  1 cup broth-based soup.  Count 1 cup casserole or other mixed foods as 2 carbohydrate servings.  Foods with less than 20 calories in a serving may be counted as 0 carbohydrates or a "free" food. You may want to purchase a book or computer software that lists the carbohydrate gram counts of different foods. In addition, the nutrition facts panel on the labels of the foods you eat are a good source of this information. The label will tell you how big the serving size is and the  total number of carbohydrate grams you will be eating per serving. Divide this number by 15 to obtain the number of carbohydrate servings in a portion. Remember, 1 carbohydrate serving equals 15 grams of carbohydrate. SERVING SIZES Measuring foods and serving sizes helps you make sure you are getting the right amount of food. The list below tells how big or small some common serving sizes are.  1 oz.........4 stacked dice.  3 oz.........Deck of cards.  1 tsp........Tip of little finger.  1 tbs........Thumb.  2 tbs........Golf ball.   cup.......Half of a fist.  1 cup........A fist. SAMPLE DIABETES MEAL PLAN Below is a sample meal plan that includes foods from the grain and starches, dairy, vegetable, fruit, and meat groups. A dietician can individualize a meal plan to fit your calorie needs and tell you the number of servings needed from each food group. However, controlling the total amount of carbohydrates in your meal or snack is more important than making sure you include all of the food groups at every meal. You may interchange carbohydrate containing foods (dairy, starches, and fruits). The meal plan below is an example of a 2000 calorie diet   using carbohydrate counting. This meal plan has 17 carbohydrate servings. Breakfast  1 cup oatmeal (2 carb servings).   cup light yogurt (1 carb serving).  1 cup blueberries (1 carb serving).   cup almonds. Snack  1 large apple (2 carb servings).  1 low-fat string cheese stick. Lunch  Chicken breast salad.  1 cup spinach.   cup chopped tomatoes.  2 oz chicken breast, sliced.  2 tbs low-fat Italian dressing.  12 whole-wheat crackers (2 carb servings).  12 to 15 grapes (1 carb serving).  1 cup low-fat milk (1 carb serving). Snack  1 cup carrots.   cup hummus (1 carb serving). Dinner  3 oz broiled salmon.  1 cup brown rice (3 carb servings). Snack  1  cups steamed broccoli (1 carb serving) drizzled with 1  tsp olive oil and lemon juice.  1 cup light pudding (2 carb servings). DIABETES MEAL PLANNING WORKSHEET Your dietician can use this worksheet to help you decide how many servings of foods and what types of foods are right for you.  BREAKFAST Food Group and Servings / Carb Servings Grain/Starches __________________________________ Dairy __________________________________________ Vegetable ______________________________________ Fruit ___________________________________________ Meat __________________________________________ Fat ____________________________________________ LUNCH Food Group and Servings / Carb Servings Grain/Starches ___________________________________ Dairy ___________________________________________ Fruit ____________________________________________ Meat ___________________________________________ Fat _____________________________________________ DINNER Food Group and Servings / Carb Servings Grain/Starches ___________________________________ Dairy ___________________________________________ Fruit ____________________________________________ Meat ___________________________________________ Fat _____________________________________________ SNACKS Food Group and Servings / Carb Servings Grain/Starches ___________________________________ Dairy ___________________________________________ Vegetable _______________________________________ Fruit ____________________________________________ Meat ___________________________________________ Fat _____________________________________________ DAILY TOTALS Starches _________________________ Vegetable ________________________ Fruit ____________________________ Dairy ____________________________ Meat ____________________________ Fat ______________________________ Document Released: 02/11/2005 Document Revised: 08/09/2011 Document Reviewed: 12/23/2008 Exit[employer] Patient Information 2014 [employer].  

## 2013-09-20 NOTE — Assessment & Plan Note (Signed)
Last A1C  9.3 She declines repeat labs today  I advised her to start checking her sugars twice daily Bring me a sugar log in 1 month at your follow up visit Will check A1C and microalbumin at that time

## 2013-09-20 NOTE — Assessment & Plan Note (Signed)
Uncontrolled on Lisinopril-HCT, hydralazine She reports she has not been taking her medications daily  She declines blood work today  I encouraged her to get on the ball, exercise, eat right and take medication as directed Will check CBC and CMET in 1 month at follow up visit

## 2013-09-20 NOTE — Assessment & Plan Note (Signed)
Myalgias with statins On fish oil Consider red yeast rice Will recheck lipid profile at follow up visit

## 2013-09-21 ENCOUNTER — Telehealth: Payer: Self-pay | Admitting: Internal Medicine

## 2013-09-21 NOTE — Telephone Encounter (Signed)
Relevant patient education mailed to patient.  

## 2013-10-02 ENCOUNTER — Encounter: Payer: Self-pay | Admitting: Internal Medicine

## 2013-10-23 ENCOUNTER — Ambulatory Visit: Payer: 59 | Admitting: Internal Medicine

## 2013-10-23 DIAGNOSIS — Z0289 Encounter for other administrative examinations: Secondary | ICD-10-CM

## 2013-10-30 ENCOUNTER — Other Ambulatory Visit: Payer: Self-pay | Admitting: Internal Medicine

## 2013-10-31 ENCOUNTER — Other Ambulatory Visit: Payer: Self-pay

## 2013-10-31 DIAGNOSIS — E118 Type 2 diabetes mellitus with unspecified complications: Secondary | ICD-10-CM

## 2013-10-31 MED ORDER — INSULIN ASPART PROT & ASPART (70-30 MIX) 100 UNIT/ML ~~LOC~~ SUSP: 62.0000 [IU] | Freq: Two times a day (BID) | SUBCUTANEOUS | Status: DC

## 2013-10-31 NOTE — Telephone Encounter (Signed)
This Rx was last filled 10/27/12--last OV 09/20/13--please advise

## 2013-10-31 NOTE — Telephone Encounter (Signed)
[employer] left v/m; pt wanting to increase quantity of novolog mix to 30 ml; pt has to pay full copay for 10 ml which last approx 10 days. Pt last seen 09/20/13 and to return in one month with glucose log; pt was no show on05/26/15. OK to refill for 30 ml?

## 2014-02-23 ENCOUNTER — Emergency Department (HOSPITAL_COMMUNITY)
Admission: EM | Admit: 2014-02-23 | Discharge: 2014-02-23 | Disposition: A | Discharge status: Home / Self Care | Payer: 59 | Attending: Emergency Medicine | Admitting: Emergency Medicine

## 2014-02-23 ENCOUNTER — Encounter (HOSPITAL_COMMUNITY): Payer: Self-pay | Admitting: Emergency Medicine

## 2014-02-23 DIAGNOSIS — Z79899 Other long term (current) drug therapy: Secondary | ICD-10-CM | POA: Insufficient documentation

## 2014-02-23 DIAGNOSIS — E785 Hyperlipidemia, unspecified: Secondary | ICD-10-CM | POA: Insufficient documentation

## 2014-02-23 DIAGNOSIS — I10 Essential (primary) hypertension: Secondary | ICD-10-CM | POA: Diagnosis not present

## 2014-02-23 DIAGNOSIS — F329 Major depressive disorder, single episode, unspecified: Secondary | ICD-10-CM | POA: Insufficient documentation

## 2014-02-23 DIAGNOSIS — K047 Periapical abscess without sinus: Secondary | ICD-10-CM | POA: Diagnosis not present

## 2014-02-23 DIAGNOSIS — M129 Arthropathy, unspecified: Secondary | ICD-10-CM | POA: Diagnosis not present

## 2014-02-23 DIAGNOSIS — E119 Type 2 diabetes mellitus without complications: Secondary | ICD-10-CM | POA: Insufficient documentation

## 2014-02-23 DIAGNOSIS — E876 Hypokalemia: Secondary | ICD-10-CM | POA: Diagnosis not present

## 2014-02-23 DIAGNOSIS — F3289 Other specified depressive episodes: Secondary | ICD-10-CM | POA: Diagnosis not present

## 2014-02-23 DIAGNOSIS — K089 Disorder of teeth and supporting structures, unspecified: Secondary | ICD-10-CM | POA: Diagnosis present

## 2014-02-23 DIAGNOSIS — Z794 Long term (current) use of insulin: Secondary | ICD-10-CM | POA: Diagnosis not present

## 2014-02-23 DIAGNOSIS — R52 Pain, unspecified: Secondary | ICD-10-CM | POA: Insufficient documentation

## 2014-02-23 DIAGNOSIS — R509 Fever, unspecified: Secondary | ICD-10-CM | POA: Diagnosis not present

## 2014-02-23 LAB — I-STAT CHEM 8, ED
BUN: 22 mg/dL (ref 6–23)
CALCIUM ION: 1.16 mmol/L (ref 1.12–1.23)
CREATININE: 1 mg/dL (ref 0.50–1.10)
Chloride: 98 mEq/L (ref 96–112)
Glucose, Bld: 232 mg/dL — ABNORMAL HIGH (ref 70–99)
HCT: 41 % (ref 36.0–46.0)
Hemoglobin: 13.9 g/dL (ref 12.0–15.0)
Potassium: 3.2 mEq/L — ABNORMAL LOW (ref 3.7–5.3)
Sodium: 138 mEq/L (ref 137–147)
TCO2: 26 mmol/L (ref 0–100)

## 2014-02-23 MED ORDER — PENICILLIN V POTASSIUM 250 MG PO TABS
500.0000 mg | ORAL_TABLET | Freq: Once | ORAL | Status: AC
  Administered 2014-02-23: 500 mg via ORAL
  Filled 2014-02-23: qty 2

## 2014-02-23 MED ORDER — POTASSIUM CHLORIDE CRYS ER 20 MEQ PO TBCR
40.0000 meq | EXTENDED_RELEASE_TABLET | Freq: Once | ORAL | Status: AC
  Administered 2014-02-23: 40 meq via ORAL
  Filled 2014-02-23: qty 2

## 2014-02-23 MED ORDER — PENICILLIN V POTASSIUM 500 MG PO TABS: 500.0000 mg | ORAL_TABLET | Freq: Four times a day (QID) | ORAL | Status: AC

## 2014-02-23 NOTE — ED Provider Notes (Signed)
CSN: [id number]     Arrival date & time 02/23/14  1903 History   First MD Initiated Contact with Patient 02/23/14 1911     Chief Complaint  Patient presents with  . Generalized Body Aches  . Dental Pain     (Consider location/radiation/quality/duration/timing/severity/associated sxs/prior Treatment) HPI Complaint toothache at upper incisor, tooth numbers 11and 21 onset one year ago, becoming worse over the past several days. She's had subjective fevers since yesterday and complains of pain radiating to her jaw. She has been able to express some pus from the gingiva at the gumline of teeth 11 and 21 no treatment prior to coming here nothing makes symptoms better or worse. No chills no nausea or vomiting. Past Medical History  Diagnosis Date  . Hypertension   . Diabetes mellitus   . Arthritis   . Depression   . Hyperlipidemia    Past Surgical History  Procedure Laterality Date  . Breast biopsy  2012   Family History  Problem Relation Age of Onset  . Cancer Other     parent  . Diabetes Other     parent  . Heart disease Other     parent  . Hyperlipidemia Other     parent  . Hypertension Other     parent  . Arthritis Other     parent  . Heart disease Mother   . Hypertension Mother   . Diabetes Mother   . Cancer Father     hodgkins lymphoma  . Heart disease Sister     heart attack   History  Substance Use Topics  . Smoking status: Never Smoker   . Smokeless tobacco: Never Used  . Alcohol Use: Yes     Comment: occasional   positive marijuana use OB History   Grav Para Term Preterm Abortions TAB SAB Ect Mult Living                 Review of Systems  Constitutional: Positive for fever.       Malaise. Subjective fever  HENT: Positive for dental problem.   Respiratory: Negative.   Cardiovascular: Negative.   Gastrointestinal: Negative.   Musculoskeletal: Negative.  Negative for myalgias.  Skin: Negative.   Neurological: Negative.   Psychiatric/Behavioral:  Negative.   All other systems reviewed and are negative.     Allergies  Review of patient's allergies indicates no known allergies.  Home Medications   Prior to Admission medications   Medication Sig Start Date End Date Taking? Authorizing Provider  aluminum chloride (DRYSOL) 20 % external solution Apply topically at bedtime. 09/20/13   Jearld Fenton, NP  glimepiride (AMARYL) 4 MG tablet Take 1 tablet (4 mg total) by mouth daily with breakfast. 09/20/13   Jearld Fenton, NP  insulin aspart protamine- aspart (NOVOLOG MIX 70/30) (70-30) 100 UNIT/ML injection Inject 0.62 mLs (62 Units total) into the skin 2 (two) times daily with a meal. 10/31/13   Jearld Fenton, NP  Insulin Syringe-Needle U-100 (BD INSULIN SYRINGE ULTRAFINE) 31G X 5/16" 1 ML MISC Use as directed to inject insulin once daily 09/20/13   Jearld Fenton, NP  lisinopril-hydrochlorothiazide (PRINZIDE,ZESTORETIC) 20-25 MG per tablet Take 2 tablets by mouth daily. 09/20/13   Jearld Fenton, NP  Multiple Vitamins-Minerals (HAIR/SKIN/NAILS/BIOTIN) TABS Take 1 tablet by mouth daily.    Historical Provider, MD  Omega-3 Fatty Acids (FISH OIL) 1000 MG CAPS Take 1 capsule by mouth daily.    Historical Provider, MD  potassium chloride SA (  K-DUR,KLOR-CON) 20 MEQ tablet Take one tablet by mouth one time daily    Jearld Fenton, NP  SitaGLIPtin-MetFORMIN HCl 50-1000 MG TB24 Take 1 tablet by mouth daily. 09/20/13   Jearld Fenton, NP   BP 165/119  Pulse 87  Temp(Src) 97.7 F (36.5 C)  Resp 18  Wt 271 lb 9 oz (123.18 kg)  SpO2 98% Physical Exam  Nursing note and vitals reviewed. Constitutional: She appears well-developed and well-nourished. No distress.  HENT:  Head: Normocephalic and atraumatic.  Gingiva immediately superior to the #1121 swollen, mildly tender. Unable to speak express a slight amount of yellow pus at the gingiva  Eyes: Conjunctivae are normal. Pupils are equal, round, and reactive to light.  Neck: Neck supple. No tracheal  deviation present. No thyromegaly present.  Cardiovascular: Normal rate and regular rhythm.   No murmur heard. Pulmonary/Chest: Effort normal and breath sounds normal.  Obese  Abdominal: Soft. Bowel sounds are normal. She exhibits no distension. There is no tenderness.  Musculoskeletal: Normal range of motion. She exhibits no edema and no tenderness.  Lymphadenopathy:    She has no cervical adenopathy.  Neurological: She is alert. Coordination normal.  Skin: Skin is warm and dry. No rash noted.  Psychiatric: She has a normal mood and affect.    ED Course  Procedures (including critical care time) Labs Review Labs Reviewed - No data to display  Imaging Review No results found.   EKG Interpretation None     Declines pain medication. 8:55 PM patient resting comfortably. Case management and social worker consult not obtainable presently. Patient states she cannot get glucose monitoring strips due to lack of funds. I suggested she contactMs Baity, NP to get help in obtaining necessary equipment Results for orders placed during the hospital encounter of 02/23/14  I-STAT CHEM 8, ED      Result Value Ref Range   Sodium 138  137 - 147 mEq/L   Potassium 3.2 (*) 3.7 - 5.3 mEq/L   Chloride 98  96 - 112 mEq/L   BUN 22  6 - 23 mg/dL   Creatinine, Ser 1.00  0.50 - 1.10 mg/dL   Glucose, Bld 232 (*) 70 - 99 mg/dL   Calcium, Ion 1.16  1.12 - 1.23 mmol/L   TCO2 26  0 - 100 mmol/L   Hemoglobin 13.9  12.0 - 15.0 g/dL   HCT 41.0  36.0 - 46.0 %   No results found.  MDM  Plan prescription Pen-Vee K. Potassium chloride 40 meq prior to discharge. Tylenol prn pain Final diagnoses:  None   blood pressure recheck 3 weeks Dental referral Dr.Farless Diagnosis #1 dental abscess #2 hypertension #3 hyperglycemia #4 hypokalemia     Orlie Dakin, MD 02/23/14 2108

## 2014-02-23 NOTE — ED Notes (Signed)
edp informed no case management or social worker available at this time

## 2014-02-23 NOTE — ED Notes (Signed)
edp at bedside  

## 2014-02-23 NOTE — ED Notes (Signed)
Pt in c/o toothache and body aches, states she has been having a fever at home, none right now, pt states she is concerned because she is a diabetic and is worried the infection is spreading

## 2014-02-23 NOTE — Discharge Instructions (Signed)
Dental Abscess  Call Dr. Mariel Sleet in 2 days to schedule the next available appointment. Tell office staff that you were seen here when scheduling the appointment. Take Tylenol as directed for pain. Your blood sugar today was 232, which is high. Blood potassium was 3.2 which was low. Blood pressure was 172/99. Contact Ms .Baity,NP next week to recheck your blood sugar and blood potassium and blood pressure within a week. Ask her to help you to obtain glucose monitoring strips. A dental abscess is a collection of infected fluid (pus) from a bacterial infection in the inner part of the tooth (pulp). It usually occurs at the end of the tooth's root.  CAUSES   Severe tooth decay.  Trauma to the tooth that allows bacteria to enter into the pulp, such as a broken or chipped tooth. SYMPTOMS   Severe pain in and around the infected tooth.  Swelling and redness around the abscessed tooth or in the mouth or face.  Tenderness.  Pus drainage.  Bad breath.  Bitter taste in the mouth.  Difficulty swallowing.  Difficulty opening the mouth.  Nausea.  Vomiting.  Chills.  Swollen neck glands. DIAGNOSIS   A medical and dental history will be taken.  An examination will be performed by tapping on the abscessed tooth.  X-rays may be taken of the tooth to identify the abscess. TREATMENT The goal of treatment is to eliminate the infection. You may be prescribed antibiotic medicine to stop the infection from spreading. A root canal may be performed to save the tooth. If the tooth cannot be saved, it may be pulled (extracted) and the abscess may be drained.  HOME CARE INSTRUCTIONS  Only take over-the-counter or prescription medicines for pain, fever, or discomfort as directed by your caregiver.  Rinse your mouth (gargle) often with salt water ( tsp salt in 8 oz [250 ml] of warm water) to relieve pain or swelling.  Do not drive after taking pain medicine (narcotics).  Do not apply heat to the  outside of your face.  Return to your dentist for further treatment as directed. SEEK MEDICAL CARE IF:  Your pain is not helped by medicine.  Your pain is getting worse instead of better. SEEK IMMEDIATE MEDICAL CARE IF:  You have a fever or persistent symptoms for more than 2-3 days.  You have a fever and your symptoms suddenly get worse.  You have chills or a very bad headache.  You have problems breathing or swallowing.  You have trouble opening your mouth.  You have swelling in the neck or around the eye. Document Released: 05/17/2005 Document Revised: 02/09/2012 Document Reviewed: 08/25/2010 [facility] Patient Information 2015 [place], [place]. This information is not intended to replace advice given to you by your health care provider. Make sure you discuss any questions you have with your health care provider.

## 2014-02-25 ENCOUNTER — Telehealth: Payer: Self-pay | Admitting: *Deleted

## 2014-02-25 NOTE — Telephone Encounter (Signed)
[facility] Triage Call Report Triage Record Num: [id number] Operator: Janit Pagan Patient Name: Kimberly Frazier Call Date & Time: 02/23/2014 6:17:06PM Patient Phone: [phone] PCP: Webb Silversmith Patient Gender: Female PCP Fax : Patient DOB: 03/04/1961 Practice Name: [facility] Reason for Call: Caller: [name]/Patient; PCP: Webb Silversmith; CB#: [phone]; Call regarding Tooth infection, pain in face and jaw; Has had infection around left front tooth for a week, had noticed blood when brushing teeth. Now if touches area has pus coming from around tooth. Swelling on left side of face and neck, tender and sore in left temple area. Has experienced heartburn, chest pressure during the night for several nights. Vision in left eye blurred like haze over eye and pain in left eye. Wants antibiotic called in. Triaged in Teeth and Jaw Symptoms and Eye pain or vision change guidelines - See ED Immediately due to sudden loss or change in vision and not previously evaluated. Will go to [facility] ER, has someone to drive. Care information given for transport. Protocol(s) Used: Eye: Pain or Vision Change Recommended Outcome per Protocol: See ED Immediately Reason for Outcome: Sudden loss or change in vision (double or blurred vision, increased light sensitivity, partial loss of visual field) AND not previously evaluated Care Advice: ~ Protect the patient from falling or other harm. ~ Another adult should drive. ~ Wear dark UV-blocking sunglasses to protect eyes from sun exposure or for light sensitivity. 09/

## 2014-04-02 ENCOUNTER — Other Ambulatory Visit: Payer: Self-pay | Admitting: Internal Medicine

## 2014-05-01 ENCOUNTER — Encounter: Payer: Self-pay | Admitting: Family

## 2014-05-01 ENCOUNTER — Ambulatory Visit (INDEPENDENT_AMBULATORY_CARE_PROVIDER_SITE_OTHER): Payer: 59 | Admitting: Family

## 2014-05-01 VITALS — BP 168/94 | HR 89 | Temp 98.0°F | Resp 18 | Ht 64.0 in | Wt 267.0 lb

## 2014-05-01 DIAGNOSIS — S39012A Strain of muscle, fascia and tendon of lower back, initial encounter: Secondary | ICD-10-CM | POA: Insufficient documentation

## 2014-05-01 MED ORDER — CYCLOBENZAPRINE HCL 5 MG PO TABS: 5.0000 mg | ORAL_TABLET | Freq: Three times a day (TID) | ORAL | Status: DC | PRN

## 2014-05-01 MED ORDER — IBUPROFEN 800 MG PO TABS: 800.0000 mg | ORAL_TABLET | Freq: Three times a day (TID) | ORAL | Status: DC | PRN

## 2014-05-01 NOTE — Progress Notes (Signed)
Pre visit review using our clinic review tool, if applicable. No additional management support is needed unless otherwise documented below in the visit note. 

## 2014-05-01 NOTE — Assessment & Plan Note (Addendum)
Symptoms and exam consistent with low back strain. She does describe some radiculopathy with associated pain. Start ibuprofen 800 mg every 8 hours as needed. Start Flexeril as needed for sleep. Stretching exercises demonstrated to patient and patient knowledge instructions. Follow up if symptoms worsen or fail to improve. Will consider imaging or referral to sports medicine if pain continues.

## 2014-05-01 NOTE — Progress Notes (Signed)
   Subjective:    Patient ID: Kimberly Frazier, female    DOB: [birth year], 53 y.o.   MRN: [id number]  Chief Complaint  Patient presents with  . Back Pain    left lower back pain that goes into left leg, x4 days    HPI:  Kimberly Frazier is a 53 y.o. female who presents today low back pain.   Acute symptoms started on 11/30 with left lower back pain that goes down the back of her leg. She woke up and felt the discomfort. Attempted to use Solonpas, which helped a little. Has also tried to use a heat pad which also helps, but the pain remains. Described as an achiness. The course has been staying about the same over the past couple of days. Denies any trauma or lifting that may have started this. 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  Has tried Advil, but did not take the pain away this time.  No Known Allergies  Current Outpatient Prescriptions on File Prior to Visit  Medication Sig Dispense Refill  . aluminum chloride (DRYSOL) 20 % external solution Apply topically at bedtime. 35 mL 0  . glimepiride (AMARYL) 4 MG tablet TAKE ONE TABLET BY MOUTH ONE TIME DAILY with breakfast 30 tablet 1  . insulin aspart protamine- aspart (NOVOLOG MIX 70/30) (70-30) 100 UNIT/ML injection Inject 0.62 mLs (62 Units total) into the skin 2 (two) times daily with a meal. 30 mL 2  . Insulin Syringe-Needle U-100 (BD INSULIN SYRINGE ULTRAFINE) 31G X 5/16" 1 ML MISC Use as directed to inject insulin once daily 100 each 3  . lisinopril-hydrochlorothiazide (PRINZIDE,ZESTORETIC) 20-25 MG per tablet Take 2 tablets by mouth daily. 60 tablet 3  . potassium chloride SA (K-DUR,KLOR-CON) 20 MEQ tablet Take one tablet by mouth one time daily 30 tablet 2  . SitaGLIPtin-MetFORMIN HCl 50-1000 MG TB24 Take 1 tablet by mouth daily. 30 tablet 2   No current facility-administered medications on file prior to visit.   Review of Systems    See HPI  Objective:    BP 168/94 mmHg  Pulse 89  Temp(Src) 98 F (36.7  C) (Oral)  Resp 18  Ht 5\' 4"  (1.626 m)  Wt 267 lb (121.11 kg)  BMI 45.81 kg/m2  SpO2 97% Nursing note and vital signs reviewed.  Physical Exam  Constitutional: She is oriented to person, place, and time. She appears well-developed and well-nourished. No distress.  Obese female seated in the chair, dressed appropriately and appears stated age.   Cardiovascular: Normal rate, regular rhythm, normal heart sounds and intact distal pulses.   Pulmonary/Chest: Effort normal and breath sounds normal.  Musculoskeletal:  No obvious deformity, discoloration, or edema of lower back noted. Tenderness elicited over paraspinal musculature on the left side. Palpation of gluteus maximus increased radiculopathy down the left leg. Straight leg raise with dorsiflexion of the foot increased the pain. Reflexes, sensation, and pulses are intact and appropriate bilaterally.  Neurological: She is alert and oriented to person, place, and time.  Skin: Skin is warm and dry.  Psychiatric: She has a normal mood and affect. Her behavior is normal. Judgment and thought content normal.       Assessment & Plan:

## 2014-05-01 NOTE — Patient Instructions (Signed)
Thank you for choosing [facility].  Summary/Instructions:  Your prescription(s) have been submitted to your pharmacy. Please take as directed and contact our office if you believe you are having problem(s) with the medication(s).  If your symptoms worsen or fail to improve, please contact our office for further instruction, or in case of emergency go directly to the emergency room at the closest medical facility.   Her back, please use moist heat 2-3 times per day for about 20 minutes as needed. May use icy/hot or Thermacare for symptom relief.   Please stretcher back as we discussed at least 2 or 3 times per day holding each stretch for about 30 seconds.  Lumbosacral Strain Lumbosacral strain is a strain of any of the parts that make up your lumbosacral vertebrae. Your lumbosacral vertebrae are the bones that make up the lower third of your backbone. Your lumbosacral vertebrae are held together by muscles and tough, fibrous tissue (ligaments).  CAUSES  A sudden blow to your back can cause lumbosacral strain. Also, anything that causes an excessive stretch of the muscles in the low back can cause this strain. This is typically seen when people exert themselves strenuously, fall, lift heavy objects, bend, or crouch repeatedly. RISK FACTORS  Physically demanding work.  Participation in pushing or pulling sports or sports that require a sudden twist of the back (tennis, golf, baseball).  Weight lifting.  Excessive lower back curvature.  Forward-tilted pelvis.  Weak back or abdominal muscles or both.  Tight hamstrings. SIGNS AND SYMPTOMS  Lumbosacral strain may cause pain in the area of your injury or pain that moves (radiates) down your leg.  DIAGNOSIS Your health care provider can often diagnose lumbosacral strain through a physical exam. In some cases, you may need tests such as X-ray exams.  TREATMENT  Treatment for your lower back injury depends on many factors that your  clinician will have to evaluate. However, most treatment will include the use of anti-inflammatory medicines. HOME CARE INSTRUCTIONS   Avoid hard physical activities (tennis, racquetball, waterskiing) if you are not in proper physical condition for it. This may aggravate or create problems.  If you have a back problem, avoid sports requiring sudden body movements. Swimming and walking are generally safer activities.  Maintain good posture.  Maintain a healthy weight.  For acute conditions, you may put ice on the injured area.  Put ice in a plastic bag.  Place a towel between your skin and the bag.  Leave the ice on for 20 minutes, 2-3 times a day.  When the low back starts healing, stretching and strengthening exercises may be recommended. SEEK MEDICAL CARE IF:  Your back pain is getting worse.  You experience severe back pain not relieved with medicines. SEEK IMMEDIATE MEDICAL CARE IF:   You have numbness, tingling, weakness, or problems with the use of your arms or legs.  There is a change in bowel or bladder control.  You have increasing pain in any area of the body, including your belly (abdomen).  You notice shortness of breath, dizziness, or feel faint.  You feel sick to your stomach (nauseous), are throwing up (vomiting), or become sweaty.  You notice discoloration of your toes or legs, or your feet get very cold. MAKE SURE YOU:   Understand these instructions.  Will watch your condition.  Will get help right away if you are not doing well or get worse. Document Released: 02/24/2005 Document Revised: 05/22/2013 Document Reviewed: 01/03/2013 [employer] Patient Information 2015  [employer]. This information is not intended to replace advice given to you by your health care provider. Make sure you discuss any questions you have with your health care provider.

## 2014-05-02 ENCOUNTER — Other Ambulatory Visit: Payer: Self-pay | Admitting: Internal Medicine

## 2014-05-03 ENCOUNTER — Other Ambulatory Visit: Payer: Self-pay

## 2014-05-03 MED ORDER — POTASSIUM CHLORIDE CRYS ER 20 MEQ PO TBCR: 20.0000 meq | EXTENDED_RELEASE_TABLET | Freq: Every day | ORAL | Status: DC

## 2014-06-16 ENCOUNTER — Encounter (HOSPITAL_COMMUNITY): Payer: Self-pay | Admitting: *Deleted

## 2014-06-16 ENCOUNTER — Emergency Department (HOSPITAL_COMMUNITY)
Admission: EM | Admit: 2014-06-16 | Discharge: 2014-06-16 | Disposition: A | Discharge status: Home / Self Care | Payer: 59 | Attending: Emergency Medicine | Admitting: Emergency Medicine

## 2014-06-16 ENCOUNTER — Emergency Department (HOSPITAL_COMMUNITY): Payer: 59

## 2014-06-16 DIAGNOSIS — I313 Pericardial effusion (noninflammatory): Secondary | ICD-10-CM

## 2014-06-16 DIAGNOSIS — Z79899 Other long term (current) drug therapy: Secondary | ICD-10-CM | POA: Diagnosis not present

## 2014-06-16 DIAGNOSIS — I10 Essential (primary) hypertension: Secondary | ICD-10-CM | POA: Insufficient documentation

## 2014-06-16 DIAGNOSIS — Z8659 Personal history of other mental and behavioral disorders: Secondary | ICD-10-CM | POA: Insufficient documentation

## 2014-06-16 DIAGNOSIS — E119 Type 2 diabetes mellitus without complications: Secondary | ICD-10-CM | POA: Insufficient documentation

## 2014-06-16 DIAGNOSIS — Z794 Long term (current) use of insulin: Secondary | ICD-10-CM | POA: Diagnosis not present

## 2014-06-16 DIAGNOSIS — H538 Other visual disturbances: Secondary | ICD-10-CM | POA: Insufficient documentation

## 2014-06-16 DIAGNOSIS — I3139 Other pericardial effusion (noninflammatory): Secondary | ICD-10-CM

## 2014-06-16 DIAGNOSIS — R51 Headache: Secondary | ICD-10-CM | POA: Diagnosis not present

## 2014-06-16 DIAGNOSIS — R42 Dizziness and giddiness: Secondary | ICD-10-CM | POA: Diagnosis not present

## 2014-06-16 DIAGNOSIS — I6521 Occlusion and stenosis of right carotid artery: Secondary | ICD-10-CM | POA: Diagnosis not present

## 2014-06-16 DIAGNOSIS — M542 Cervicalgia: Secondary | ICD-10-CM | POA: Diagnosis not present

## 2014-06-16 DIAGNOSIS — R110 Nausea: Secondary | ICD-10-CM | POA: Insufficient documentation

## 2014-06-16 LAB — I-STAT CHEM 8, ED
BUN: 24 mg/dL — AB (ref 6–23)
CREATININE: 0.9 mg/dL (ref 0.50–1.10)
Calcium, Ion: 1.16 mmol/L (ref 1.12–1.23)
Chloride: 99 mEq/L (ref 96–112)
GLUCOSE: 262 mg/dL — AB (ref 70–99)
HEMATOCRIT: 44 % (ref 36.0–46.0)
HEMOGLOBIN: 15 g/dL (ref 12.0–15.0)
POTASSIUM: 3.4 mmol/L — AB (ref 3.5–5.1)
Sodium: 140 mmol/L (ref 135–145)
TCO2: 27 mmol/L (ref 0–100)

## 2014-06-16 LAB — CBC WITH DIFFERENTIAL/PLATELET
Basophils Absolute: 0 10*3/uL (ref 0.0–0.1)
Basophils Relative: 0 % (ref 0–1)
EOS ABS: 0.1 10*3/uL (ref 0.0–0.7)
Eosinophils Relative: 1 % (ref 0–5)
HCT: 40 % (ref 36.0–46.0)
HEMOGLOBIN: 13.7 g/dL (ref 12.0–15.0)
LYMPHS PCT: 27 % (ref 12–46)
Lymphs Abs: 2.9 10*3/uL (ref 0.7–4.0)
MCH: 29.7 pg (ref 26.0–34.0)
MCHC: 34.3 g/dL (ref 30.0–36.0)
MCV: 86.6 fL (ref 78.0–100.0)
Monocytes Absolute: 0.6 10*3/uL (ref 0.1–1.0)
Monocytes Relative: 5 % (ref 3–12)
NEUTROS PCT: 67 % (ref 43–77)
Neutro Abs: 7.1 10*3/uL (ref 1.7–7.7)
Platelets: 197 10*3/uL (ref 150–400)
RBC: 4.62 MIL/uL (ref 3.87–5.11)
RDW: 13.3 % (ref 11.5–15.5)
WBC: 10.7 10*3/uL — AB (ref 4.0–10.5)

## 2014-06-16 LAB — I-STAT BETA HCG BLOOD, ED (MC, WL, AP ONLY)

## 2014-06-16 MED ORDER — SODIUM CHLORIDE 0.9 % IV BOLUS (SEPSIS)
1000.0000 mL | Freq: Once | INTRAVENOUS | Status: AC
  Administered 2014-06-16: 1000 mL via INTRAVENOUS

## 2014-06-16 MED ORDER — IOHEXOL 350 MG/ML SOLN
50.0000 mL | Freq: Once | INTRAVENOUS | Status: AC | PRN
  Administered 2014-06-16: 50 mL via INTRAVENOUS

## 2014-06-16 MED ORDER — METOCLOPRAMIDE HCL 5 MG/ML IJ SOLN
10.0000 mg | Freq: Once | INTRAMUSCULAR | Status: AC
  Administered 2014-06-16: 10 mg via INTRAVENOUS
  Filled 2014-06-16: qty 2

## 2014-06-16 NOTE — ED Provider Notes (Signed)
Complains of nonradiating posterior neck pain onset yesterday upon awakening. Pain is worse when she laughs or moves her neck. No focal numbness or weakness no chest pain no shortness of breath no treatment prior to coming here she is pain-free pain is improved when she remains still. On exam alert no distress HEENT exam no facial asymmetry neck supple trachea midline no bruit lungs clear auscultation heart regular rate and rhythm abdomen obese normal active bowel sounds nontender extremities without edema neurologic motor strength 5 over 5 overall cranial nerves II through XII grossly intact moves all extremities well  Kimberly Dakin, MD 06/16/14 1955

## 2014-06-16 NOTE — ED Notes (Signed)
The pt is c/o pain at the base of her neck since yesterday.  thge pain comes and goes and when she has the pain thge visiion in her  Rt eye is blurred.  Nausea no vomiting.  Her neck feels its swelling intermittently

## 2014-06-16 NOTE — ED Provider Notes (Signed)
CSN: [id number]     Arrival date & time 06/16/14  1649 History   First MD Initiated Contact with Patient 06/16/14 1734     Chief Complaint  Patient presents with  . Neck Pain     (Consider location/radiation/quality/duration/timing/severity/associated sxs/prior Treatment) HPI  Kimberly Frazier is a 54 y.o. female with PMH of hypertension, diabetes, arthritis, depression, hyperlipidemia presenting with sudden onset pain in the right side of her neck that started yesterday. Pain comes and goes and is worse with sudden movement of her head. It is also associated with less than a minute of blurred vision in her right eye. She denies any neck trauma. Patient has taken 2 Aleve for her discomfort without improvement. She states she feels at times her neck swells intermittently. She has never had pain like this before. She is also with nausea but no vomiting or weakness. She denies slurred speech. No vertigo. She states she feels lightheaded during episodes. She states she also has a generalized headache. That is like other headache she has had before and developed gradually. No fevers or chills.   Past Medical History  Diagnosis Date  . Hypertension   . Diabetes mellitus   . Arthritis   . Depression   . Hyperlipidemia    Past Surgical History  Procedure Laterality Date  . Breast biopsy  2012   Family History  Problem Relation Age of Onset  . Cancer Other     parent  . Diabetes Other     parent  . Heart disease Other     parent  . Hyperlipidemia Other     parent  . Hypertension Other     parent  . Arthritis Other     parent  . Heart disease Mother   . Hypertension Mother   . Diabetes Mother   . Cancer Father     hodgkins lymphoma  . Heart disease Sister     heart attack   History  Substance Use Topics  . Smoking status: Never Smoker   . Smokeless tobacco: Never Used  . Alcohol Use: Yes     Comment: occasional   OB History    No data available     Review of Systems 10  Systems reviewed and are negative for acute change except as noted in the HPI.    Allergies  Review of patient's allergies indicates no known allergies.  Home Medications   Prior to Admission medications   Medication Sig Start Date End Date Taking? Authorizing Provider  glimepiride (AMARYL) 4 MG tablet TAKE ONE TABLET BY MOUTH ONE TIME DAILY with breakfast 04/03/14  Yes Jearld Fenton, NP  ibuprofen (ADVIL,MOTRIN) 800 MG tablet Take 1 tablet (800 mg total) by mouth every 8 (eight) hours as needed. Patient taking differently: Take 800 mg by mouth every 8 (eight) hours as needed (pain).  05/01/14  Yes Mauricio Po, FNP  Liniments [facility]) PADS Apply 1 each topically at bedtime as needed (foot pain).   Yes Historical Provider, MD  lisinopril-hydrochlorothiazide (PRINZIDE,ZESTORETIC) 20-25 MG per tablet Take 2 tablets by mouth daily. Patient taking differently: Take 1 tablet by mouth 2 (two) times daily. Take 1 tablet every morning and 1 tablet at noon 09/20/13  Yes Jearld Fenton, NP  naproxen sodium (ANAPROX) 220 MG tablet Take 440 mg by mouth daily as needed.   Yes Historical Provider, MD  NOVOLOG MIX 70/30 (70-30) 100 UNIT/ML injection inject 0.62 mls (62 units total) into the skin twice daily with a meal  05/02/14  Yes Jearld Fenton, NP  potassium chloride SA (K-DUR,KLOR-CON) 20 MEQ tablet Take 1 tablet (20 mEq total) by mouth daily. 05/03/14  Yes Jearld Fenton, NP  aluminum chloride (DRYSOL) 20 % external solution Apply topically at bedtime. Patient not taking: Reported on 06/16/2014 09/20/13   Jearld Fenton, NP  cyclobenzaprine (FLEXERIL) 5 MG tablet Take 1 tablet (5 mg total) by mouth 3 (three) times daily as needed for muscle spasms. Patient not taking: Reported on 06/16/2014 05/01/14   Mauricio Po, FNP  Insulin Syringe-Needle U-100 (BD INSULIN SYRINGE ULTRAFINE) 31G X 5/16" 1 ML MISC Use as directed to inject insulin once daily 09/20/13   Jearld Fenton, NP  SitaGLIPtin-MetFORMIN HCl  50-1000 MG TB24 Take 1 tablet by mouth daily. Patient not taking: Reported on 06/16/2014 09/20/13   Jearld Fenton, NP   BP 162/94 mmHg  Pulse 83  Temp(Src) 98 F (36.7 C) (Oral)  Resp 16  Ht 5\' 4"  (1.626 m)  Wt 270 lb (122.471 kg)  BMI 46.32 kg/m2  SpO2 98% Physical Exam  Constitutional: She appears well-developed and well-nourished. No distress.  HENT:  Head: Normocephalic and atraumatic.  Mouth/Throat: Oropharynx is clear and moist.  Eyes: Conjunctivae and EOM are normal. Pupils are equal, round, and reactive to light. Right eye exhibits no discharge. Left eye exhibits no discharge.  Neck: Normal range of motion. Neck supple.  Tenderness to base of neck on right side that extends into the hairline without overlying skin changes or evidence of swelling. No nuchal rigidity.  Cardiovascular: Normal rate and regular rhythm.   Pulmonary/Chest: Effort normal and breath sounds normal. No respiratory distress. She has no wheezes.  Abdominal: Soft. Bowel sounds are normal. She exhibits no distension. There is no tenderness.  Neurological: She is alert. No cranial nerve deficit. Coordination normal.  Speech is clear and goal oriented. Peripheral visual fields intact. Strength 5/5 in upper and lower extremities. Sensation intact. Intact rapid alternating movements, finger to nose, and heel to shin. Negative Romberg. No pronator drift. Normal gait.   Skin: Skin is warm and dry. She is not diaphoretic.  Nursing note and vitals reviewed.   ED Course  Procedures (including critical care time) Labs Review Labs Reviewed  CBC WITH DIFFERENTIAL - Abnormal; Notable for the following:    WBC 10.7 (*)    All other components within normal limits  I-STAT CHEM 8, ED - Abnormal; Notable for the following:    Potassium 3.4 (*)    BUN 24 (*)    Glucose, Bld 262 (*)    All other components within normal limits  I-STAT BETA HCG BLOOD, ED ([facility], [facility], [facility] ONLY)    Imaging Review Ct Angio Neck W/cm &/or  Wo/cm  06/16/2014   CLINICAL DATA:  Non radiating posterior neck pain beginning yesterday. Pain is worse when she last are most her neck.  EXAM: CT ANGIOGRAPHY NECK  TECHNIQUE: Multidetector CT imaging of the neck was performed using the standard protocol during bolus administration of intravenous contrast. Multiplanar CT image reconstructions and MIPs were obtained to evaluate the vascular anatomy. Carotid stenosis measurements (when applicable) are obtained utilizing NASCET criteria, using the distal internal carotid diameter as the denominator.  CONTRAST:  20mL OMNIPAQUE IOHEXOL 350 MG/ML SOLN  COMPARISON:  CT neck 11/01/2012  FINDINGS: Aortic arch: Minimal calcification is noted at the origin of the left common carotid artery. There is an arch origin of the left vertebral artery. The aortic arch is otherwise unremarkable.  Right carotid system: The innominate artery is mildly tortuous. Right common carotid artery is unremarkable. Atherosclerotic calcifications are present at the proximal right internal carotid artery. There is moderate tortuosity of the cervical right ICA with a 50% narrowing. The visualized intracranial right ICA is unremarkable.  Left carotid system: The left common carotid artery is tortuous without atherosclerotic change. The left common carotid bifurcation is within normal limits. Moderate tortuosity is present in the cervical left ICA without a stenosis. The visualized intracranial left ICA is within normal limits.  Vertebral arteries:A proximal right vertebral artery is tortuous. There is no focal stenosis. The right vertebral artery is otherwise within normal limits throughout the neck. Right PICA origin is normal. The vertebrobasilar junction and basilar artery are within normal limits.  The left vertebral artery originates from the arch. There is moderate tortuosity before the artery enters the spinal canal at C6.  Bone windows are unremarkable. Vertebral body heights and alignment  are maintained. No focal lytic or blastic lesions are evident. The soft tissues are within normal limits in the neck. The lung apices are clear. A small pericardial effusion is noted.  IMPRESSION: 1. No evidence for acute vascular injury. 2. Moderate tortuosity of the high cervical internal carotid arteries bilaterally. There is a 50% stenosis of the cervical right ICA at the beginning of the tortuous segment. 3. Minimal atherosclerotic changes at the right carotid bifurcation and origin of the left common carotid artery. 4. 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 5. Moderate tortuosity of the proximal vertebral arteries bilaterally without significant stenoses. 6. Pericardial effusion. This is not quantified and only seen on the upper cuts through the heart.   Electronically Signed   By: Lawrence Santiago M.D.   On: 06/16/2014 20:42     EKG Interpretation None      MDM   Final diagnoses:  Neck pain, acute  Internal carotid artery stenosis, right  Pericardial effusion   Patient with right-sided chest area or neck pain is worse with sudden movement. Patient denies any neck injury. Patient also with mild headache that is like other headaches she has had before with gradual onset. Not worse of life or maximal in intensity or maximal at onset. Patient denies any visual changes at this time, nausea and vomiting, weakness. Patient with normal neurological exam. Full range of motion of neck and no bruits. No skin changes. Will get CTA to rule out vertebral dissection. I doubt patient's headache due to subarachnoid hemorrhage, intracranial hemorrhage, meningitis. CTA without evidence for acute vascular injury. Patient with 50% stenosis of right ICA as well as small pericardial effusion. These are incidental findings. No chest pain, shortness of breath. No chest complaint. Patient to follow-up with her primary care provider next month.   Discussed return precautions with patient. Discussed all results and  patient verbalizes understanding and agrees with plan.  This is a shared patient. This patient was discussed with the physician, Dr. Winfred Leeds who saw and evaluated the patient and agrees with the plan.      Pura Spice, PA-C 06/16/14 2151  Orlie Dakin, MD 06/17/14 [phone]

## 2014-06-16 NOTE — Discharge Instructions (Signed)
Return to the emergency room with worsening of symptoms, new symptoms or with symptoms that are concerning, especially chest pain, shortness of breath, lightheadedness, you feel faint, dizziness, pass out OR , especially severe worsening of headache, visual or speech changes, weakness in face, arms or legs. The below information and follow-up recommendations.  Please call your doctor for a followup appointment within 24-48 hours. When you talk to your doctor please let them know that you were seen in the emergency department and have them acquire all of your records so that they can discuss the findings with you and formulate a treatment plan to fully care for your new and ongoing problems.   CT angio Neck IMPRESSION: 1. No evidence for acute vascular injury. 2. Moderate tortuosity of the high cervical internal carotid arteries bilaterally. There is a 50% stenosis of the cervical right ICA at the beginning of the tortuous segment. 3. Minimal atherosclerotic changes at the right carotid bifurcation and origin of the left common carotid artery. 4. 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 5. Moderate tortuosity of the proximal vertebral arteries bilaterally without significant stenoses. 6. Pericardial effusion. This is not quantified and only seen on the upper cuts through the heart.    Carotid Artery Disease The carotid arteries are the two main arteries on either side of the neck that supply blood to the brain. Carotid artery disease, also called carotid artery stenosis, is the narrowing or blockage of one or both carotid arteries. Carotid artery disease increases your risk for a stroke or a transient ischemic attack (TIA). A TIA is an episode in which a waxy, fatty substance that accumulates within the artery (plaque) blocks blood flow to the brain. A TIA is considered a "warning stroke."  CAUSES   Buildup of plaque inside the carotid arteries (atherosclerosis) (common).  A weakened  outpouching in an artery (aneurysm).  Inflammation of the carotid artery (arteritis).  A fibrous growth within the carotid artery (fibromuscular dysplasia).  Tissue death within the carotid artery due to radiation treatment (post-radiation necrosis).  Decreased blood flow due to spasms of the carotid artery (vasospasm).  Separation of the walls of the carotid artery (carotid dissection). RISK FACTORS  High cholesterol (dyslipidemia).   High blood pressure (hypertension).   Smoking.   Obesity.   Diabetes.   Family history of cardiovascular disease.   Inactivity or lack of regular exercise.   Being female. Men have an increased risk of developing atherosclerosis earlier in life than women.  SYMPTOMS  Carotid artery disease does not cause symptoms. DIAGNOSIS Diagnosis of carotid artery disease may include:   A physical exam. Your health care provider may hear an abnormal sound (bruit) when listening to the carotid arteries.   Specific tests that look at the blood flow in the carotid arteries. These tests include:   Carotid artery ultrasonography.   Carotid or cerebral angiography.   Computerized tomographic angiography (CTA).   Magnetic resonance angiography (MRA).  TREATMENT  Treatment of carotid artery disease can include a combination of treatments. Treatment options include:  Surgery. You may have:   A carotid endarterectomy. This is a surgery to remove the blockages in the carotid arteries.   A carotid angioplasty with stenting. This is a nonsurgical interventional procedure. A wire mesh (stent) is used to widen the blocked carotid arteries.   Medicines to control blood pressure, cholesterol, and reduce blood clotting (antiplatelet therapy).   Adjusting your diet.   Lifestyle changes such as:   Quitting smoking.  Exercising as tolerated or as directed by your health care provider.   Controlling and maintaining a good blood  pressure.   Keeping cholesterol levels under control.  HOME CARE INSTRUCTIONS   Take medicines only as directed by your health care provider. Make sure you understand all your medicine instructions. Do not stop your medicines without talking to your health care provider.   Follow your health care provider's diet instructions. It is important to eat a healthy diet that is low in saturated fats and includes plenty of fresh fruits, vegetables, and lean meats. High-fat, high-sodium foods as well as foods that are fried, overly processed, or have poor nutritional value should be avoided.  Maintain a healthy weight.   Stay physically active. It is recommended that you get at least 30 minutes of activity every day.   Do not use any tobacco products including cigarettes, chewing tobacco, or electronic cigarettes. If you need help quitting, ask your health care provider.  Limit alcohol use to:   No more than 2 drinks per day for men.   No more than 1 drink per day for nonpregnant women.   Do not use illegal drugs.   Keep all follow-up visits as directed by your health care provider.  SEEK IMMEDIATE MEDICAL CARE IF:  You develop TIA or stroke symptoms. These include:   Sudden weakness or numbness on one side of the body, such as in the face, arm, or leg.   Sudden confusion.   Trouble speaking (aphasia) or understanding.   Sudden trouble seeing out of one or both eyes.   Sudden trouble walking.   Dizziness or feeling like you might faint.   Loss of balance or coordination.   Sudden severe headache with no known cause.   Sudden trouble swallowing (dysphagia).  If you have any of these symptoms, call your local emergency services (911 in [place] Do not drive yourself to the clinic or hospital. This is a medical emergency.  Document Released: 08/09/2011 Document Revised: 10/01/2013 Document Reviewed: 11/15/2012 [facility] Patient Information 2015 [place], [place]. This  information is not intended to replace advice given to you by your health care provider. Make sure you discuss any questions you have with your health care provider.

## 2014-06-19 ENCOUNTER — Other Ambulatory Visit (INDEPENDENT_AMBULATORY_CARE_PROVIDER_SITE_OTHER): Payer: 59

## 2014-06-19 ENCOUNTER — Encounter: Payer: Self-pay | Admitting: Internal Medicine

## 2014-06-19 ENCOUNTER — Ambulatory Visit (INDEPENDENT_AMBULATORY_CARE_PROVIDER_SITE_OTHER): Payer: 59 | Admitting: Internal Medicine

## 2014-06-19 VITALS — BP 144/92 | HR 102 | Temp 97.4°F | Resp 16 | Ht 64.0 in | Wt 269.8 lb

## 2014-06-19 DIAGNOSIS — IMO0002 Reserved for concepts with insufficient information to code with codable children: Secondary | ICD-10-CM

## 2014-06-19 DIAGNOSIS — I10 Essential (primary) hypertension: Secondary | ICD-10-CM

## 2014-06-19 DIAGNOSIS — E1165 Type 2 diabetes mellitus with hyperglycemia: Secondary | ICD-10-CM

## 2014-06-19 DIAGNOSIS — S39012D Strain of muscle, fascia and tendon of lower back, subsequent encounter: Secondary | ICD-10-CM

## 2014-06-19 DIAGNOSIS — M542 Cervicalgia: Secondary | ICD-10-CM

## 2014-06-19 LAB — COMPREHENSIVE METABOLIC PANEL
ALK PHOS: 94 U/L (ref 39–117)
ALT: 22 U/L (ref 0–35)
AST: 17 U/L (ref 0–37)
Albumin: 3.8 g/dL (ref 3.5–5.2)
BUN: 21 mg/dL (ref 6–23)
CO2: 31 mEq/L (ref 19–32)
CREATININE: 0.89 mg/dL (ref 0.40–1.20)
Calcium: 9.9 mg/dL (ref 8.4–10.5)
Chloride: 100 mEq/L (ref 96–112)
GFR: 84.43 mL/min (ref >60.00)
Glucose, Bld: 225 mg/dL — ABNORMAL HIGH (ref 70–99)
Potassium: 3.2 mEq/L — ABNORMAL LOW (ref 3.5–5.1)
Sodium: 138 mEq/L (ref 135–145)
TOTAL PROTEIN: 7.3 g/dL (ref 6.0–8.3)
Total Bilirubin: 0.7 mg/dL (ref 0.2–1.2)

## 2014-06-19 LAB — HEMOGLOBIN A1C: HEMOGLOBIN A1C: 11.5 % — AB (ref 4.6–6.5)

## 2014-06-19 LAB — LIPID PANEL
Cholesterol: 209 mg/dL — ABNORMAL HIGH (ref 0–200)
HDL: 37.4 mg/dL — AB (ref >39.00)
LDL Cholesterol: 145 mg/dL — ABNORMAL HIGH (ref 0–99)
NONHDL: 171.6
Total CHOL/HDL Ratio: 6
Triglycerides: 132 mg/dL (ref 0.0–149.0)
VLDL: 26.4 mg/dL (ref 0.0–40.0)

## 2014-06-19 MED ORDER — NAPROXEN 500 MG PO TABS: 500.0000 mg | ORAL_TABLET | Freq: Two times a day (BID) | ORAL | Status: DC

## 2014-06-19 NOTE — Progress Notes (Signed)
   Subjective:    Patient ID: Kimberly Frazier, female    DOB: 1961/04/10, 54 y.o.   MRN: [id number]  HPI The patient is a 54 YO female who is coming in for ER follow up. She was seen for neck pain and got CT to rule out aneurysm or blockage. She was shown to have a 50% stenosis in the carotid. She is still having some pain which is better with ibuprofen and heating pad. She has PMH of diabetes type 2, htn, hyperlipidemia, obesity who has not been seen in some time and is scared that she could be having some severe problems as there is strong family history for stroke, heart attack, cholesterol, diabetes, blood pressure.   Review of Systems  Constitutional: Negative for fever, activity change, appetite change, fatigue and unexpected weight change.  HENT: Negative.   Respiratory: Negative for cough, chest tightness, shortness of breath and wheezing.   Cardiovascular: Negative for chest pain, palpitations and leg swelling.  Gastrointestinal: Negative for nausea, abdominal pain, diarrhea, constipation and abdominal distention.  Musculoskeletal: Positive for neck pain. Negative for back pain, arthralgias and gait problem.  Skin: Negative.   Neurological: Negative for dizziness, weakness, light-headedness and headaches.      Objective:   Physical Exam  Constitutional: She is oriented to person, place, and time. She appears well-developed and well-nourished.  HENT:  Head: Normocephalic and atraumatic.  Eyes: EOM are normal.  Neck: Normal range of motion.  Cardiovascular: Normal rate and regular rhythm.   Pulmonary/Chest: Effort normal and breath sounds normal. No respiratory distress. She has no wheezes. She has no rales.  Abdominal: Soft. Bowel sounds are normal. She exhibits no distension. There is no tenderness. There is no rebound.  Musculoskeletal: She exhibits no edema.  Neurological: She is alert and oriented to person, place, and time. Coordination normal.  Skin: Skin is warm and dry.    Filed Vitals:   06/19/14 0812  BP: 144/92  Pulse: 102  Temp: 97.4 F (36.3 C)  TempSrc: Oral  Resp: 16  Height: 5\' 4"  (1.626 m)  Weight: 269 lb 12.8 oz (122.38 kg)  SpO2: 97%      Assessment & Plan:

## 2014-06-19 NOTE — Progress Notes (Signed)
Pre visit review using our clinic review tool, if applicable. No additional management support is needed unless otherwise documented below in the visit note. 

## 2014-06-19 NOTE — Patient Instructions (Addendum)
We will check your blood work today and will talk to you about the results when you come back.   Keep using the heating pad on your neck for the pain and we have sent in a refill of naproxen which you can take 1 pill twice a day as needed for pain.   We will call in your refills tomorrow if the blood work comes back without any problems.   Come back in February so that we can talk about all your other medical problems including your diabetes.   Diabetes and Exercise Exercising regularly is important. It is not just about losing weight. It has many health benefits, such as:  Improving your overall fitness, flexibility, and endurance.  Increasing your bone density.  Helping with weight control.  Decreasing your body fat.  Increasing your muscle strength.  Reducing stress and tension.  Improving your overall health. People with diabetes who exercise gain additional benefits because exercise:  Reduces appetite.  Improves the body's use of blood sugar (glucose).  Helps lower or control blood glucose.  Decreases blood pressure.  Helps control blood lipids (such as cholesterol and triglycerides).  Improves the body's use of the hormone insulin by:  Increasing the body's insulin sensitivity.  Reducing the body's insulin needs.  Decreases the risk for heart disease because exercising:  Lowers cholesterol and triglycerides levels.  Increases the levels of good cholesterol (such as high-density lipoproteins [HDL]) in the body.  Lowers blood glucose levels. YOUR ACTIVITY PLAN  Choose an activity that you enjoy and set realistic goals. Your health care provider or diabetes educator can help you make an activity plan that works for you. Exercise regularly as directed by your health care provider. This includes:  Performing resistance training twice a week such as push-ups, sit-ups, lifting weights, or using resistance bands.  Performing 150 minutes of cardio exercises each  week such as walking, running, or playing sports.  Staying active and spending no more than 90 minutes at one time being inactive. Even short bursts of exercise are good for you. Three 10-minute sessions spread throughout the day are just as beneficial as a single 30-minute session. Some exercise ideas include:  Taking the dog for a walk.  Taking the stairs instead of the elevator.  Dancing to your favorite song.  Doing an exercise video.  Doing your favorite exercise with a friend. RECOMMENDATIONS FOR EXERCISING WITH TYPE 1 OR TYPE 2 DIABETES   Check your blood glucose before exercising. If blood glucose levels are greater than 240 mg/dL, check for urine ketones. Do not exercise if ketones are present.  Avoid injecting insulin into areas of the body that are going to be exercised. For example, avoid injecting insulin into:  The arms when playing tennis.  The legs when jogging.  Keep a record of:  Food intake before and after you exercise.  Expected peak times of insulin action.  Blood glucose levels before and after you exercise.  The type and amount of exercise you have done.  Review your records with your health care provider. Your health care provider will help you to develop guidelines for adjusting food intake and insulin amounts before and after exercising.  If you take insulin or oral hypoglycemic agents, watch for signs and symptoms of hypoglycemia. They include:  Dizziness.  Shaking.  Sweating.  Chills.  Confusion.  Drink plenty of water while you exercise to prevent dehydration or heat stroke. Body water is lost during exercise and must be replaced.  Talk to your health care provider before starting an exercise program to make sure it is safe for you. Remember, almost any type of activity is better than none. Document Released: 08/07/2003 Document Revised: 10/01/2013 Document Reviewed: 10/24/2012 [facility] Patient Information 2015 [place], [place]. This  information is not intended to replace advice given to you by your health care provider. Make sure you discuss any questions you have with your health care provider.

## 2014-06-20 DIAGNOSIS — M542 Cervicalgia: Secondary | ICD-10-CM | POA: Insufficient documentation

## 2014-06-20 NOTE — Assessment & Plan Note (Signed)
This is doing better since last visit.

## 2014-06-20 NOTE — Assessment & Plan Note (Addendum)
Improving with ibuprofen and heating pad. Will refill her naproxen instead as it is easier on GI. CT neck shows 50% stenosis carotid and warrants further follow up with ultrasound in 6 months.

## 2014-06-20 NOTE — Assessment & Plan Note (Signed)
Patient is taking her blood pressure medicine and BP okay today.

## 2014-07-04 ENCOUNTER — Other Ambulatory Visit: Payer: Self-pay | Admitting: Internal Medicine

## 2014-07-15 ENCOUNTER — Ambulatory Visit: Payer: 59 | Admitting: Internal Medicine

## 2014-07-16 ENCOUNTER — Ambulatory Visit (INDEPENDENT_AMBULATORY_CARE_PROVIDER_SITE_OTHER): Payer: 59 | Admitting: Internal Medicine

## 2014-07-16 ENCOUNTER — Encounter: Payer: Self-pay | Admitting: Internal Medicine

## 2014-07-16 VITALS — BP 158/92 | HR 96 | Temp 99.6°F | Resp 14 | Ht 64.0 in | Wt 269.1 lb

## 2014-07-16 DIAGNOSIS — E785 Hyperlipidemia, unspecified: Secondary | ICD-10-CM

## 2014-07-16 DIAGNOSIS — I10 Essential (primary) hypertension: Secondary | ICD-10-CM

## 2014-07-16 DIAGNOSIS — E118 Type 2 diabetes mellitus with unspecified complications: Secondary | ICD-10-CM

## 2014-07-16 DIAGNOSIS — E1165 Type 2 diabetes mellitus with hyperglycemia: Secondary | ICD-10-CM

## 2014-07-16 DIAGNOSIS — IMO0002 Reserved for concepts with insufficient information to code with codable children: Secondary | ICD-10-CM

## 2014-07-16 MED ORDER — EXENATIDE ER 2 MG ~~LOC~~ PEN: 2.0000 mg | PEN_INJECTOR | SUBCUTANEOUS | Status: DC

## 2014-07-16 MED ORDER — GLIMEPIRIDE 4 MG PO TABS: ORAL_TABLET | ORAL | Status: DC

## 2014-07-16 MED ORDER — GLUCOSE BLOOD VI STRP: ORAL_STRIP | Status: DC

## 2014-07-16 MED ORDER — SIMVASTATIN 40 MG PO TABS: 40.0000 mg | ORAL_TABLET | Freq: Every day | ORAL | Status: DC

## 2014-07-16 MED ORDER — LISINOPRIL-HYDROCHLOROTHIAZIDE 20-12.5 MG PO TABS: 2.0000 | ORAL_TABLET | Freq: Every day | ORAL | Status: DC

## 2014-07-16 NOTE — Progress Notes (Signed)
Pre visit review using our clinic review tool, if applicable. No additional management support is needed unless otherwise documented below in the visit note. 

## 2014-07-16 NOTE — Patient Instructions (Signed)
We will change several things today:  1. Start taking a cholesterol medicine daily to help prevent future blockages.   2. Start taking a new medicine for the diabetes that is an injection once weekly that helps with the sugars and weight loss. It is called bydureon and can be taken any day of the week that you can remember.   3. We will have you start seeing a cardiologist. You should hear back about that in 1-2 weeks from our office.   4. We think that you do need to work on losing some weight and increasing your exercise. This will help the sugars, blood pressures, and help with the cholesterol.   5. We need you to pick up the strips and start checking your sugars at least twice per day.   6. We will see you back in about 3 months so we can check on the sugars.   Diabetes and Exercise Exercising regularly is important. It is not just about losing weight. It has many health benefits, such as:  Improving your overall fitness, flexibility, and endurance.  Increasing your bone density.  Helping with weight control.  Decreasing your body fat.  Increasing your muscle strength.  Reducing stress and tension.  Improving your overall health. People with diabetes who exercise gain additional benefits because exercise:  Reduces appetite.  Improves the body's use of blood sugar (glucose).  Helps lower or control blood glucose.  Decreases blood pressure.  Helps control blood lipids (such as cholesterol and triglycerides).  Improves the body's use of the hormone insulin by:  Increasing the body's insulin sensitivity.  Reducing the body's insulin needs.  Decreases the risk for heart disease because exercising:  Lowers cholesterol and triglycerides levels.  Increases the levels of good cholesterol (such as high-density lipoproteins [HDL]) in the body.  Lowers blood glucose levels. YOUR ACTIVITY PLAN  Choose an activity that you enjoy and set realistic goals. Your health care  provider or diabetes educator can help you make an activity plan that works for you. Exercise regularly as directed by your health care provider. This includes:  Performing resistance training twice a week such as push-ups, sit-ups, lifting weights, or using resistance bands.  Performing 150 minutes of cardio exercises each week such as walking, running, or playing sports.  Staying active and spending no more than 90 minutes at one time being inactive. Even short bursts of exercise are good for you. Three 10-minute sessions spread throughout the day are just as beneficial as a single 30-minute session. Some exercise ideas include:  Taking the dog for a walk.  Taking the stairs instead of the elevator.  Dancing to your favorite song.  Doing an exercise video.  Doing your favorite exercise with a friend. RECOMMENDATIONS FOR EXERCISING WITH TYPE 1 OR TYPE 2 DIABETES   Check your blood glucose before exercising. If blood glucose levels are greater than 240 mg/dL, check for urine ketones. Do not exercise if ketones are present.  Avoid injecting insulin into areas of the body that are going to be exercised. For example, avoid injecting insulin into:  The arms when playing tennis.  The legs when jogging.  Keep a record of:  Food intake before and after you exercise.  Expected peak times of insulin action.  Blood glucose levels before and after you exercise.  The type and amount of exercise you have done.  Review your records with your health care provider. Your health care provider will help you to develop guidelines for  adjusting food intake and insulin amounts before and after exercising.  If you take insulin or oral hypoglycemic agents, watch for signs and symptoms of hypoglycemia. They include:  Dizziness.  Shaking.  Sweating.  Chills.  Confusion.  Drink plenty of water while you exercise to prevent dehydration or heat stroke. Body water is lost during exercise and  must be replaced.  Talk to your health care provider before starting an exercise program to make sure it is safe for you. Remember, almost any type of activity is better than none. Document Released: 08/07/2003 Document Revised: 10/01/2013 Document Reviewed: 10/24/2012 [facility] Patient Information 2015 [place], [place]. This information is not intended to replace advice given to you by your health care provider. Make sure you discuss any questions you have with your health care provider.

## 2014-07-19 ENCOUNTER — Encounter: Payer: Self-pay | Admitting: Internal Medicine

## 2014-07-19 NOTE — Assessment & Plan Note (Signed)
She is on ACE-I, she takes novolog 70/30 BID 62 units for her diabetes. She is also taking amaryl daily for the diabetes.She has had intolerance to metformin and is not willing to try that right now. She does have neuropathy in her feet although does not require medication. Will add bydureon weekly for control of her diabetes and since she states she was off medication will give her very short lease to do better with the sugars. She may require more drastic changes in her regimen as she was not well controlled even when she was with regular care. She will be seen back in 3 months for HgA1c and further regimen adjustment.

## 2014-07-19 NOTE — Assessment & Plan Note (Signed)
Recent lipid panel off medications not at goal. Will recheck and adjust medications in about 6 months. She is on simvastatin 40 mg daily for now. No side effects.

## 2014-07-19 NOTE — Progress Notes (Signed)
   Subjective:    Patient ID: Kimberly Frazier, female    DOB: 01/30/1961, 54 y.o.   MRN: [id number]  HPI The patient is a 54 YO female who is coming in as a new patient to discuss her diabetes. She has struggled with diabetes for years and has been not well controlled over that time. She is currently taking her novolog 70/30 again and thinks that it is doing okay. She denies any hypoglycemia. She is also taking amaryl. She does not exercise as much as she should and does not always eat the best foods for her diabetes. She does have neuropathy in her feet which bother her at times. She does not need any medicine for the neuropathy. She wants to do better and we discuss her lab results from one month ago.   PMH, [facility], social history, allergies, medications, problem list reviewed and updated.   Review of Systems  Constitutional: Negative for fever, activity change, appetite change, fatigue and unexpected weight change.  HENT: Negative.   Eyes: Negative.   Respiratory: Negative for cough, chest tightness, shortness of breath and wheezing.   Cardiovascular: Negative for chest pain, palpitations and leg swelling.  Gastrointestinal: Negative for nausea, abdominal pain, diarrhea, constipation and abdominal distention.  Endocrine: Negative.   Musculoskeletal: Negative for back pain, arthralgias, gait problem and neck pain.  Skin: Negative.   Neurological: Negative for dizziness, weakness, light-headedness and headaches.  Psychiatric/Behavioral: Negative.       Objective:   Physical Exam  Constitutional: She is oriented to person, place, and time. She appears well-developed and well-nourished.  HENT:  Head: Normocephalic and atraumatic.  Eyes: EOM are normal.  Neck: Normal range of motion.  Cardiovascular: Normal rate and regular rhythm.   Pulmonary/Chest: Effort normal and breath sounds normal. No respiratory distress. She has no wheezes. She has no rales.  Abdominal: Soft. Bowel sounds are normal.  She exhibits no distension. There is no tenderness. There is no rebound.  Musculoskeletal: She exhibits no edema.  Neurological: She is alert and oriented to person, place, and time. Coordination normal.  Skin: Skin is warm and dry.   Filed Vitals:   07/16/14 1418  BP: 158/92  Pulse: 96  Temp: 99.6 F (37.6 C)  TempSrc: Oral  Resp: 14  Height: 5\' 4"  (1.626 m)  Weight: 269 lb 1.9 oz (122.072 kg)  SpO2: 96%      Assessment & Plan:  Patient declines flu shot.

## 2014-07-19 NOTE — Assessment & Plan Note (Signed)
We did have the discussion today about her weight and the fact that it is likely increasing her blood pressure, sugars, and cholesterol. She desperately needs to lose weight for her health.

## 2014-07-19 NOTE — Assessment & Plan Note (Signed)
BP mildly high today and likely due to stress of coming in. She states lower at home. Will need to adjust regimen at next visit if still elevated. She will continue on lisinopril/hctz 20/12.5 for now.

## 2014-07-29 ENCOUNTER — Telehealth: Payer: Self-pay | Admitting: Internal Medicine

## 2014-07-29 DIAGNOSIS — R002 Palpitations: Secondary | ICD-10-CM

## 2014-07-29 NOTE — Telephone Encounter (Signed)
Placed.

## 2014-07-29 NOTE — Telephone Encounter (Signed)
Please f/u with pt about this referral

## 2014-07-29 NOTE — Telephone Encounter (Signed)
Pt called to check up on the referral for heart doctor, no referral in the system. Please check

## 2014-07-31 ENCOUNTER — Other Ambulatory Visit: Payer: Self-pay | Admitting: Internal Medicine

## 2014-08-02 ENCOUNTER — Ambulatory Visit (INDEPENDENT_AMBULATORY_CARE_PROVIDER_SITE_OTHER): Payer: 59 | Admitting: Cardiovascular Disease

## 2014-08-02 ENCOUNTER — Encounter: Payer: Self-pay | Admitting: Cardiovascular Disease

## 2014-08-02 VITALS — BP 160/96 | HR 82 | Ht 64.0 in | Wt 269.1 lb

## 2014-08-02 DIAGNOSIS — I10 Essential (primary) hypertension: Secondary | ICD-10-CM

## 2014-08-02 DIAGNOSIS — E785 Hyperlipidemia, unspecified: Secondary | ICD-10-CM

## 2014-08-02 DIAGNOSIS — I6521 Occlusion and stenosis of right carotid artery: Secondary | ICD-10-CM

## 2014-08-02 MED ORDER — CARVEDILOL 3.125 MG PO TABS: 3.1250 mg | ORAL_TABLET | Freq: Two times a day (BID) | ORAL | Status: DC

## 2014-08-02 MED ORDER — ATORVASTATIN CALCIUM 40 MG PO TABS: 40.0000 mg | ORAL_TABLET | Freq: Every day | ORAL | Status: DC

## 2014-08-02 NOTE — Progress Notes (Signed)
Cardiology Office Note   Date:  08/02/2014   ID:  Kimberly Frazier, DOB Sep 09, 1960, MRN [id number]  PCP:  Olga Millers, MD  Cardiologist:   Thayer Headings, MD   Chief Complaint  Patient presents with  . Palpitations   1. Essential hypertension 2. Diabetes mellitus 3. Hyperlipidemia 4. Morbid obesity 5. Palpitations 6. R carotid stenosis - 50 % by CT angiogram Feb. 2016.    History of Present Illness: Kimberly Frazier is a 54 y.o. female who presents for evaluation of some mild CP. She has DOE on occasion.   This has worsened over the past several month  Also has difficulty keeping her BP regulated.   THe CP is very atypical, worse with lying down.  Tends to go to her left side and arm.  She does comment on some chest tightness / fagitue with exertion. Does not exercise regularly. Works Scientist, research (medical).   She has tried to limit her fast foods recently.  Has also tried to use less salt.   Non smoker Some ETOH.  + Fhx - cardiac problems, mother died of MI,  Brothers died from MI  Has been gradually worsening over the past several weeks.    Past Medical History  Diagnosis Date  . Hypertension   . Diabetes mellitus   . Arthritis   . Depression   . Hyperlipidemia     Past Surgical History  Procedure Laterality Date  . Breast biopsy  2012     Current Outpatient Prescriptions  Medication Sig Dispense Refill  . Exenatide ER 2 MG PEN Inject 2 mg into the skin once a week. 4 each 3  . glimepiride (AMARYL) 4 MG tablet TAKE ONE TABLET BY MOUTH ONE TIME DAILY with breakfast 30 tablet 5  . glucose blood (FREESTYLE LITE) test strip Use as instructed 100 each 12  . Insulin Syringe-Needle U-100 (BD INSULIN SYRINGE ULTRAFINE) 31G X 5/16" 1 ML MISC Use as directed to inject insulin once daily 100 each 3  . Liniments (SALONPAS) PADS Apply 1 each topically at bedtime as needed (foot pain).    Marland Kitchen lisinopril-hydrochlorothiazide (ZESTORETIC) 20-12.5 MG per tablet Take 2 tablets by mouth  daily. 60 tablet 6  . naproxen (NAPROSYN) 500 MG tablet Take 1 tablet (500 mg total) by mouth 2 (two) times daily with a meal. 45 tablet 0  . NOVOLOG MIX 70/30 (70-30) 100 UNIT/ML injection inject 0.62 mls (62 units total) into the skin twice daily with a meal 30 mL 0  . potassium chloride SA (K-DUR,KLOR-CON) 20 MEQ tablet TAKE ONE TABLET BY MOUTH ONE TIME DAILY  30 tablet 2  . simvastatin (ZOCOR) 40 MG tablet Take 1 tablet (40 mg total) by mouth at bedtime. 90 tablet 3   No current facility-administered medications for this visit.    Allergies:   Review of patient's allergies indicates no known allergies.    Social History:  The patient  reports that she has never smoked. She has never used smokeless tobacco. She reports that she drinks alcohol. She reports that she uses illicit drugs (Marijuana).   Family History:  The patient's family history includes Arthritis in her other; Cancer in her father and other; Diabetes in her mother and other; Heart disease in her mother, other, and sister; Hyperlipidemia in her other; Hypertension in her mother and other.    ROS:  Please see the history of present illness.    Review of Systems: Constitutional:  denies fever, chills, diaphoresis, appetite change and fatigue.  HEENT: denies photophobia, eye pain, redness, hearing loss, ear pain, congestion, sore throat, rhinorrhea, sneezing, neck pain, neck stiffness and tinnitus.  Respiratory: denies SOB, DOE, cough, chest tightness, and wheezing.  Cardiovascular: denies chest pain, palpitations and leg swelling.  Gastrointestinal: denies nausea, vomiting, abdominal pain, diarrhea, constipation, blood in stool.  Genitourinary: denies dysuria, urgency, frequency, hematuria, flank pain and difficulty urinating.  Musculoskeletal: denies  myalgias, back pain, joint swelling, arthralgias and gait problem.   Skin: denies pallor, rash and wound.  Neurological: denies dizziness, seizures, syncope, weakness,  light-headedness, numbness and headaches.   Hematological: denies adenopathy, easy bruising, personal or family bleeding history.  Psychiatric/ Behavioral: denies suicidal ideation, mood changes, confusion, nervousness, sleep disturbance and agitation.       All other systems are reviewed and negative.    PHYSICAL EXAM: VS:  BP 160/96 mmHg  Pulse 82  Ht 5\' 4"  (1.626 m)  Wt 269 lb 1.9 oz (122.072 kg)  BMI 46.17 kg/m2 , BMI Body mass index is 46.17 kg/(m^2). GEN: obese female  HEENT: normal Neck: no JVD, carotid bruits,  Bounding carotid pulses , no  masses Cardiac: RRR; no murmurs, rubs, or gallops,no edema  Respiratory:  clear to auscultation bilaterally, normal work of breathing GI: soft, nontender, nondistended, + BS MS: no deformity or atrophy Skin: warm and dry, no rash Neuro:  Strength and sensation are intact Psych: normal   EKG:  EKG is ordered today. The ekg ordered today demonstrates normal sinus rhythm at 82 beats a minute. Minimal criteria for left ventricular hypertrophy.   Recent Labs: 06/16/2014: Hemoglobin 15.0; Platelets 197 06/19/2014: ALT 22; BUN 21; Creatinine 0.89; Potassium 3.2*; Sodium 138    Lipid Panel    Component Value Date/Time   CHOL 209* 06/19/2014 0854   TRIG 132.0 06/19/2014 0854   HDL 37.40* 06/19/2014 0854   CHOLHDL 6 06/19/2014 0854   VLDL 26.4 06/19/2014 0854   LDLCALC 145* 06/19/2014 0854   LDLDIRECT 138.3 01/16/2013 1449      Wt Readings from Last 3 Encounters:  08/02/14 269 lb 1.9 oz (122.072 kg)  07/16/14 269 lb 1.9 oz (122.072 kg)  06/19/14 269 lb 12.8 oz (122.38 kg)      Other studies Reviewed: Additional studies/ records that were reviewed today include: . Review of the above records demonstrates:    ASSESSMENT AND PLAN:  1. Essential hypertension - Kimberly Frazier still eats a fairly high salt diet. We will counsel her on a low-salt diet. I have encouraged her to exercise on a regular basis.  If her blood pressure is  still elevated when I see her again in 3 months: We will start some new medications. Lisinopril may not be the best medication for her. She may respond better to amlodipine or perhaps hydralazine.  We will also add low-dose carvedilol 3.125 mg twice a day.  2. Diabetes mellitus - further plans per her medical doctor.  3. Hyperlipidemia- -she's currently on Simpson 40 mg a day. I do not think that this will be strong enough for her. We'll change her to atorvastatin 40 mg a day.  4. Morbid obesity- I've encouraged her to exercise on a daily basis. She'll start exercising 5 or 10 minutes a day and will add 5 minutes to her Route each week.  5. R carotid stenosis - 50 % by CT angiogram Feb. 2016. - I do not hear any carotid bruit. Her carotid pulse is  bounding. We will start her on aspirin 81 mg a day.  Current medicines are reviewed at length with the patient today.  The patient does not have concerns regarding medicines.  The following changes have been made:  no change   Disposition:   FU with me in 3 months.  Will get fasting labs at that time.     Signed, Nahser, Wonda Cheng, MD  08/02/2014 2:48 PM    [facility] [address], [place], [place]  [address] Phone: [phone]; Fax: [phone]

## 2014-08-02 NOTE — Patient Instructions (Signed)
Your physician has recommended you make the following change in your medication:  START Aspirin 81 mg once daily STOP Simvastatin START Atorvastatin 40 mg once daily  Your physician recommends that you schedule a follow-up appointment in: 3 months with Dr. Acie Fredrickson.  Your physician recommends that you return for lab work in: 3 months on the day of or a few days before your office visit with Dr. Acie Fredrickson.  You will need to FAST for this appointment - nothing to eat or drink after midnight the night before except water.  We recommend that you decrease your intake of salt.  1. Use Morton's Salt substitute.   2. Use Ms. Dash   3. Mccormick;s salt free seasoning   4. Avoid processed meat - bacon ( including [place] bacon) , sausage, ham, hot dogs, Spam  5. Avoid [place] soup 6. Avoid prepared meals 7. Eat fresh or frozen foods that specifically do not have any added salt.

## 2014-08-29 ENCOUNTER — Other Ambulatory Visit: Payer: Self-pay | Admitting: *Deleted

## 2014-08-29 MED ORDER — INSULIN ASPART PROT & ASPART (70-30 MIX) 100 UNIT/ML ~~LOC~~ SUSP: 62.0000 [IU] | Freq: Two times a day (BID) | SUBCUTANEOUS | Status: DC

## 2014-08-29 NOTE — Telephone Encounter (Signed)
Left msg on triage needing refill on her novolog 70/30...Johny Chess

## 2014-08-30 ENCOUNTER — Other Ambulatory Visit: Payer: Self-pay | Admitting: Geriatric Medicine

## 2014-08-30 ENCOUNTER — Telehealth: Payer: Self-pay | Admitting: Internal Medicine

## 2014-08-30 MED ORDER — INSULIN ASPART PROT & ASPART (70-30 MIX) 100 UNIT/ML ~~LOC~~ SUSP: 62.0000 [IU] | Freq: Two times a day (BID) | SUBCUTANEOUS | Status: DC

## 2014-08-30 NOTE — Telephone Encounter (Signed)
Sent to pharmacy. If they kickback a PA, I will submit it.

## 2014-08-30 NOTE — Telephone Encounter (Signed)
Patient states that novalog should be sent to [id number] - [place], [place].  Also states that she started new insurance today and PA is needed to continue without interruption. The new RX info is -  [facility]  RXBIN # [id number] RX group# Samson Frederic

## 2014-09-02 NOTE — Telephone Encounter (Signed)
Pt called in and said that [facility] is needing PA for her insulin aspart protamine- aspart (NOVOLOG MIX 70/30) (70-30) 100 UNIT/ML injection [[id number]],

## 2014-09-03 ENCOUNTER — Telehealth: Payer: Self-pay | Admitting: Internal Medicine

## 2014-09-03 NOTE — Telephone Encounter (Signed)
PA has been submitted. Waiting for a response.

## 2014-09-03 NOTE — Telephone Encounter (Signed)
Form received from pharmacy.

## 2014-09-03 NOTE — Telephone Encounter (Signed)
I called patient and informed her that the PA has been submitted.

## 2014-09-03 NOTE — Telephone Encounter (Signed)
Patient is calling back about insulin aspart protamine- aspart (NOVOLOG MIX 70/30) (70-30) 100 UNIT/ML injection [[id number]] . She states that it has been 13 days since she's had any insulin.

## 2014-09-04 ENCOUNTER — Telehealth: Payer: Self-pay | Admitting: Geriatric Medicine

## 2014-09-04 NOTE — Telephone Encounter (Signed)
PA approved for novolog. Patient aware.

## 2014-10-14 ENCOUNTER — Encounter: Payer: Self-pay | Admitting: Internal Medicine

## 2014-10-14 ENCOUNTER — Ambulatory Visit (INDEPENDENT_AMBULATORY_CARE_PROVIDER_SITE_OTHER): Payer: 59 | Admitting: Internal Medicine

## 2014-10-14 VITALS — BP 160/92 | HR 94 | Temp 98.6°F | Resp 18 | Wt 263.0 lb

## 2014-10-14 DIAGNOSIS — I10 Essential (primary) hypertension: Secondary | ICD-10-CM

## 2014-10-14 DIAGNOSIS — E118 Type 2 diabetes mellitus with unspecified complications: Secondary | ICD-10-CM

## 2014-10-14 DIAGNOSIS — E1165 Type 2 diabetes mellitus with hyperglycemia: Secondary | ICD-10-CM

## 2014-10-14 DIAGNOSIS — IMO0002 Reserved for concepts with insufficient information to code with codable children: Secondary | ICD-10-CM

## 2014-10-14 NOTE — Progress Notes (Signed)
Pre visit review using our clinic review tool, if applicable. No additional management support is needed unless otherwise documented below in the visit note. 

## 2014-10-14 NOTE — Progress Notes (Signed)
   Subjective:    Patient ID: Kimberly Frazier, female    DOB: 1960/08/21, 54 y.o.   MRN: [id number]  HPI The patient is a 54 YO female who is coming in to follow up on her hypertension and diabetes. Her blood pressure is not doing well. She saw her cardiologist who started her on carvedilol and unfortunately she did not know to pick that up and has not started taking it. Still with daily headaches. Denies chest pains. Has started taking an aspirin daily. Her diabetes is also doing poorly. She has been taking her insulin regularly but sugars are still >200 in the morning. Her diet has not been great. She is trying to exercise on her bike and has lost about 6 pounds. She was not able to take the bydureon as it gave her extreme itching.   Review of Systems  Constitutional: Negative for fever, activity change, appetite change, fatigue and unexpected weight change.  Respiratory: Negative for cough, chest tightness, shortness of breath and wheezing.   Cardiovascular: Negative for chest pain, palpitations and leg swelling.  Gastrointestinal: Negative for nausea, abdominal pain, diarrhea, constipation and abdominal distention.  Musculoskeletal: Positive for arthralgias. Negative for back pain, gait problem and neck pain.  Neurological: Positive for headaches. Negative for dizziness and weakness.      Objective:   Physical Exam  Constitutional: She is oriented to person, place, and time. She appears well-developed and well-nourished.  HENT:  Head: Normocephalic and atraumatic.  Eyes: EOM are normal.  Neck: Normal range of motion.  Cardiovascular: Normal rate and regular rhythm.   Pulmonary/Chest: Effort normal and breath sounds normal. No respiratory distress. She has no wheezes. She has no rales.  Abdominal: Soft. Bowel sounds are normal. She exhibits no distension. There is no tenderness. There is no rebound.  Musculoskeletal: She exhibits no edema.  Neurological: She is alert and oriented to person,  place, and time. Coordination normal.  Skin: Skin is warm and dry.   Filed Vitals:   10/14/14 1457  BP: 160/92  Pulse: 94  Temp: 98.6 F (37 C)  TempSrc: Oral  Resp: 18  Weight: 263 lb (119.296 kg)  SpO2: 98%      Assessment & Plan:

## 2014-10-14 NOTE — Assessment & Plan Note (Addendum)
Referral to podiatry, increase novolog 70/30 to 70 units BID since sugars still >200 most of the time. She is not eating great diet. Checking HgA1c today. She was not able to tolerate bydureon and may try invokana if she is willing if as I suspect her sugars are not well controlled. Complicated with neuropathy in her feet. Still taking glimiperide as well.

## 2014-10-14 NOTE — Patient Instructions (Signed)
Please go to the pharmacy and get carvedilol from the pharmacy and start taking 1 pill twice a day. This will help with the blood pressure and the headaches.   You can still take the naproxen twice a day for pain if you need it.   We will send you to the foot doctor.   We would like you to increase the insulin to 70 units twice a day and call us back in about 1-2 weeks with sugar readings so that we can adjust if we need to.   Come back in about 1-2 months with [place] to see if the blood pressure is doing any better.   Keep up the good work with weight loss as that is good for the sugars and the blood pressures.   Diabetes and Exercise Exercising regularly is important. It is not just about losing weight. It has many health benefits, such as:  Improving your overall fitness, flexibility, and endurance.  Increasing your bone density.  Helping with weight control.  Decreasing your body fat.  Increasing your muscle strength.  Reducing stress and tension.  Improving your overall health. People with diabetes who exercise gain additional benefits because exercise:  Reduces appetite.  Improves the body's use of blood sugar (glucose).  Helps lower or control blood glucose.  Decreases blood pressure.  Helps control blood lipids (such as cholesterol and triglycerides).  Improves the body's use of the hormone insulin by:  Increasing the body's insulin sensitivity.  Reducing the body's insulin needs.  Decreases the risk for heart disease because exercising:  Lowers cholesterol and triglycerides levels.  Increases the levels of good cholesterol (such as high-density lipoproteins [HDL]) in the body.  Lowers blood glucose levels. YOUR ACTIVITY PLAN  Choose an activity that you enjoy and set realistic goals. Your health care provider or diabetes educator can help you make an activity plan that works for you. Exercise regularly as directed by your health care provider. This  includes:  Performing resistance training twice a week such as push-ups, sit-ups, lifting weights, or using resistance bands.  Performing 150 minutes of cardio exercises each week such as walking, running, or playing sports.  Staying active and spending no more than 90 minutes at one time being inactive. Even short bursts of exercise are good for you. Three 10-minute sessions spread throughout the day are just as beneficial as a single 30-minute session. Some exercise ideas include:  Taking the dog for a walk.  Taking the stairs instead of the elevator.  Dancing to your favorite song.  Doing an exercise video.  Doing your favorite exercise with a friend. RECOMMENDATIONS FOR EXERCISING WITH TYPE 1 OR TYPE 2 DIABETES   Check your blood glucose before exercising. If blood glucose levels are greater than 240 mg/dL, check for urine ketones. Do not exercise if ketones are present.  Avoid injecting insulin into areas of the body that are going to be exercised. For example, avoid injecting insulin into:  The arms when playing tennis.  The legs when jogging.  Keep a record of:  Food intake before and after you exercise.  Expected peak times of insulin action.  Blood glucose levels before and after you exercise.  The type and amount of exercise you have done.  Review your records with your health care provider. Your health care provider will help you to develop guidelines for adjusting food intake and insulin amounts before and after exercising.  If you take insulin or oral hypoglycemic agents, watch for signs  and symptoms of hypoglycemia. They include:  Dizziness.  Shaking.  Sweating.  Chills.  Confusion.  Drink plenty of water while you exercise to prevent dehydration or heat stroke. Body water is lost during exercise and must be replaced.  Talk to your health care provider before starting an exercise program to make sure it is safe for you. Remember, almost any type of  activity is better than none. Document Released: 08/07/2003 Document Revised: 10/01/2013 Document Reviewed: 10/24/2012 [facility] Patient Information 2015 [place], [place]. This information is not intended to replace advice given to you by your health care provider. Make sure you discuss any questions you have with your health care provider.

## 2014-10-14 NOTE — Assessment & Plan Note (Addendum)
BP still elevated and she did not start taking coreg. Advised her to pick it up at the pharmacy and come back in 1-2 months for visit with BP check. She is on maximum dose of lisinopril and hctz at this time. Checking labs today. This is the likely cause of the headaches.

## 2014-10-23 ENCOUNTER — Telehealth: Payer: Self-pay | Admitting: Cardiovascular Disease

## 2014-10-23 NOTE — Telephone Encounter (Signed)
New message      Pt is due for lab work tomorrow.  She has been on amoxicillian for 3 days.  Can she still come and have lab work?

## 2014-10-23 NOTE — Telephone Encounter (Signed)
Left message on patient's personal voice mail that she can proceed with lab tests tomorrow and to call back with questions or concerns.

## 2014-10-24 ENCOUNTER — Other Ambulatory Visit (INDEPENDENT_AMBULATORY_CARE_PROVIDER_SITE_OTHER): Payer: 59 | Admitting: *Deleted

## 2014-10-24 DIAGNOSIS — E785 Hyperlipidemia, unspecified: Secondary | ICD-10-CM | POA: Diagnosis not present

## 2014-10-24 DIAGNOSIS — I10 Essential (primary) hypertension: Secondary | ICD-10-CM | POA: Diagnosis not present

## 2014-10-24 LAB — LIPID PANEL
CHOLESTEROL: 155 mg/dL (ref 0–200)
HDL: 36.4 mg/dL — ABNORMAL LOW (ref >39.00)
LDL Cholesterol: 94 mg/dL (ref 0–99)
NonHDL: 118.6
TRIGLYCERIDES: 122 mg/dL (ref 0.0–149.0)
Total CHOL/HDL Ratio: 4
VLDL: 24.4 mg/dL (ref 0.0–40.0)

## 2014-10-24 LAB — HEPATIC FUNCTION PANEL
ALT: 22 U/L (ref 0–35)
AST: 19 U/L (ref 0–37)
Albumin: 3.8 g/dL (ref 3.5–5.2)
Alkaline Phosphatase: 95 U/L (ref 39–117)
BILIRUBIN DIRECT: 0.2 mg/dL (ref 0.0–0.3)
TOTAL PROTEIN: 7.3 g/dL (ref 6.0–8.3)
Total Bilirubin: 1 mg/dL (ref 0.2–1.2)

## 2014-10-24 LAB — BASIC METABOLIC PANEL
BUN: 15 mg/dL (ref 6–23)
CALCIUM: 9.2 mg/dL (ref 8.4–10.5)
CO2: 32 mEq/L (ref 19–32)
CREATININE: 0.88 mg/dL (ref 0.40–1.20)
Chloride: 99 mEq/L (ref 96–112)
GFR: 85.43 mL/min (ref >60.00)
GLUCOSE: 340 mg/dL — AB (ref 70–99)
POTASSIUM: 3.1 meq/L — AB (ref 3.5–5.1)
SODIUM: 137 meq/L (ref 135–145)

## 2014-10-25 ENCOUNTER — Other Ambulatory Visit: Payer: 59

## 2014-10-31 ENCOUNTER — Ambulatory Visit: Payer: 59 | Admitting: Cardiovascular Disease

## 2014-11-19 ENCOUNTER — Telehealth: Payer: Self-pay | Admitting: Internal Medicine

## 2014-11-19 DIAGNOSIS — E118 Type 2 diabetes mellitus with unspecified complications: Principal | ICD-10-CM

## 2014-11-19 DIAGNOSIS — IMO0002 Reserved for concepts with insufficient information to code with codable children: Secondary | ICD-10-CM

## 2014-11-19 DIAGNOSIS — E1165 Type 2 diabetes mellitus with hyperglycemia: Secondary | ICD-10-CM

## 2014-11-19 NOTE — Telephone Encounter (Signed)
Done

## 2014-11-19 NOTE — Telephone Encounter (Signed)
It was discussed at patients last appointment for her to see a foot doctor. I don't see any type of referral. Can we get one done asap

## 2014-12-13 ENCOUNTER — Other Ambulatory Visit: Payer: Self-pay | Admitting: Internal Medicine

## 2014-12-16 ENCOUNTER — Ambulatory Visit: Payer: 59 | Admitting: Internal Medicine

## 2014-12-17 ENCOUNTER — Other Ambulatory Visit: Payer: Self-pay | Admitting: Internal Medicine

## 2014-12-23 ENCOUNTER — Ambulatory Visit: Payer: 59 | Admitting: Internal Medicine

## 2015-01-01 ENCOUNTER — Encounter: Payer: Self-pay | Admitting: Obstetrics

## 2015-01-01 ENCOUNTER — Ambulatory Visit (INDEPENDENT_AMBULATORY_CARE_PROVIDER_SITE_OTHER): Payer: 59 | Admitting: Obstetrics

## 2015-01-01 DIAGNOSIS — Z01419 Encounter for gynecological examination (general) (routine) without abnormal findings: Secondary | ICD-10-CM

## 2015-01-01 DIAGNOSIS — N393 Stress incontinence (female) (male): Secondary | ICD-10-CM | POA: Diagnosis not present

## 2015-01-01 DIAGNOSIS — B3731 Acute candidiasis of vulva and vagina: Secondary | ICD-10-CM

## 2015-01-01 DIAGNOSIS — Z124 Encounter for screening for malignant neoplasm of cervix: Secondary | ICD-10-CM

## 2015-01-01 DIAGNOSIS — Z1239 Encounter for other screening for malignant neoplasm of breast: Secondary | ICD-10-CM | POA: Diagnosis not present

## 2015-01-01 DIAGNOSIS — B373 Candidiasis of vulva and vagina: Secondary | ICD-10-CM | POA: Diagnosis not present

## 2015-01-01 MED ORDER — FLUCONAZOLE 150 MG PO TABS: 150.0000 mg | ORAL_TABLET | Freq: Once | ORAL | Status: DC

## 2015-01-01 NOTE — Progress Notes (Signed)
Subjective:        Kimberly Frazier is a 54 y.o. female here for a routine exam.  Current complaints: Leaking of urine with cough or sneeze.  Has to wear a pad because of leakage of urine.  H/O cervical cancer that was treated with radiation in ~ 1997.  Was followed until ~ 2009, but has had no follow up since 2009.  She says that she was told that she was cancer free.   Personal health questionnaire:  Is patient Ashkenazi Jewish, have a family history of breast and/or ovarian cancer: no Is there a family history of uterine cancer diagnosed at age < 74, gastrointestinal cancer, urinary tract cancer, family member who is a Field seismologist syndrome-associated carrier: no Is the patient overweight and hypertensive, family history of diabetes, personal history of gestational diabetes, preeclampsia or PCOS: no Is patient over 55, have PCOS,  family history of premature CHD under age 63, diabetes, smoke, have hypertension or peripheral artery disease:  no At any time, has a partner hit, kicked or otherwise hurt or frightened you?: no Over the past 2 weeks, have you felt down, depressed or hopeless?: no Over the past 2 weeks, have you felt little interest or pleasure in doing things?:no   Gynecologic History No LMP recorded. Patient is postmenopausal. Contraception: post menopausal status Last Pap: unknown. Results were: normal Last mammogram: unknown. Results were: normal  Obstetric History OB History  No data available    Past Medical History  Diagnosis Date  . Hypertension   . Diabetes mellitus   . Arthritis   . Depression   . Hyperlipidemia     Past Surgical History  Procedure Laterality Date  . Breast biopsy  2012     Current outpatient prescriptions:  .  atorvastatin (LIPITOR) 40 MG tablet, Take 1 tablet (40 mg total) by mouth daily., Disp: 31 tablet, Rfl: 11 .  carvedilol (COREG) 3.125 MG tablet, Take 1 tablet (3.125 mg total) by mouth 2 (two) times daily., Disp: 62 tablet, Rfl:  11 .  glucose blood (FREESTYLE LITE) test strip, Use as instructed, Disp: 100 each, Rfl: 12 .  insulin aspart protamine- aspart (NOVOLOG MIX 70/30) (70-30) 100 UNIT/ML injection, Inject 0.62 mLs (62 Units total) into the skin 2 (two) times daily with a meal., Disp: 30 mL, Rfl: 5 .  Insulin Syringe-Needle U-100 (BD INSULIN SYRINGE ULTRAFINE) 31G X 5/16" 1 ML MISC, Use as directed to inject insulin once daily, Disp: 100 each, Rfl: 3 .  Liniments (SALONPAS) PADS, Apply 1 each topically at bedtime as needed (foot pain)., Disp: , Rfl:  .  lisinopril-hydrochlorothiazide (ZESTORETIC) 20-12.5 MG per tablet, Take 2 tablets by mouth daily., Disp: 60 tablet, Rfl: 6 .  potassium chloride SA (K-DUR,KLOR-CON) 20 MEQ tablet, TAKE ONE TABLET BY MOUTH ONE TIME DAILY, Disp: 30 tablet, Rfl: 2 .  fluconazole (DIFLUCAN) 150 MG tablet, Take 1 tablet (150 mg total) by mouth once., Disp: 1 tablet, Rfl: 2 .  glimepiride (AMARYL) 4 MG tablet, TAKE ONE TABLET BY MOUTH ONE TIME DAILY with breakfast (Patient not taking: Reported on 01/01/2015), Disp: 30 tablet, Rfl: 5 .  naproxen (NAPROSYN) 500 MG tablet, Take 1 tablet (500 mg total) by mouth 2 (two) times daily with a meal. (Patient not taking: Reported on 10/14/2014), Disp: 45 tablet, Rfl: 0 No Known Allergies  History  Substance Use Topics  . Smoking status: Never Smoker   . Smokeless tobacco: Never Used  . Alcohol Use: Yes  Comment: occasional    Family History  Problem Relation Age of Onset  . Cancer Other     parent  . Diabetes Other     parent  . Heart disease Other     parent  . Hyperlipidemia Other     parent  . Hypertension Other     parent  . Arthritis Other     parent  . Heart disease Mother   . Hypertension Mother   . Diabetes Mother   . Cancer Father     hodgkins lymphoma  . Heart disease Sister     heart attack      Review of Systems  Constitutional: negative for fatigue and weight loss Respiratory: negative for cough and  wheezing Cardiovascular: negative for chest pain, fatigue and palpitations Gastrointestinal: negative for abdominal pain and change in bowel habits Musculoskeletal:negative for myalgias Neurological: negative for gait problems and tremors Behavioral/Psych: negative for abusive relationship, depression Endocrine: negative for temperature intolerance   Genitourinary:negative for abnormal menstrual periods, genital lesions, hot flashes, sexual problems and vaginal discharge Integument/breast: negative for breast lump, breast tenderness, nipple discharge and skin lesion(s)    Objective:       BP 180/112 mmHg  Pulse 102  Temp(Src) 98.1 F (36.7 C)  Wt 262 lb 3.2 oz (118.933 kg) General:   alert  Skin:   no rash or abnormalities  Lungs:   clear to auscultation bilaterally  Heart:   regular rate and rhythm, S1, S2 normal, no murmur, click, rub or gallop  Breasts:   normal without suspicious masses, skin or nipple changes or axillary nodes  Abdomen:  normal findings: no organomegaly, soft, non-tender and no hernia  Pelvis:  External genitalia: normal general appearance Urinary system: mild cystocele Vaginal: atrophic.  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  Vaginal pap smear done. Cervix: not visible due to agglutination from radiation Adnexa: normal bimanual exam Uterus: anteverted and nontender   Lab Review Urine pregnancy test Labs reviewed yes Radiologic studies reviewed yes    Assessment:    Healthy female exam.    H/O Cervical CA, treated with radiation.   SUI   Plan:   Continue yearly vaginal pap smears Referred to Urology  Mammogram ordered. Follow up in: 1 year.   Meds ordered this encounter  Medications  . fluconazole (DIFLUCAN) 150 MG tablet    Sig: Take 1 tablet (150 mg total) by mouth once.    Dispense:  1 tablet    Refill:  2   Orders Placed This Encounter  Procedures  . SureSwab, Vaginosis/Vaginitis Plus  . MM Digital  Screening    Standing Status: Future     Number of Occurrences:      Standing Expiration Date: 03/02/2016    Order Specific Question:  Reason for Exam (SYMPTOM  OR DIAGNOSIS REQUIRED)    Answer:  screening    Order Specific Question:  Is the patient pregnant?    Answer:  No    Order Specific Question:  Preferred imaging location?    Answer:  [facility]  . Ambulatory referral to Urology    Referral Priority:  Routine    Referral Type:  Consultation    Referral Reason:  Specialty Services Required    Requested Specialty:  Urology    Number of Visits Requested:  1

## 2015-01-02 LAB — PAP IG AND HPV HIGH-RISK: HPV DNA High Risk: NOT DETECTED

## 2015-01-03 ENCOUNTER — Telehealth: Payer: Self-pay

## 2015-01-03 NOTE — Telephone Encounter (Signed)
SPOKE WITH PATIENT AND LEFT HER A VM PER HER REQUEST FOR [facility] APPT 01/30/15 AT 8:30A M PHONE # [phone]

## 2015-01-04 ENCOUNTER — Other Ambulatory Visit: Payer: Self-pay | Admitting: Obstetrics

## 2015-01-04 DIAGNOSIS — B9689 Other specified bacterial agents as the cause of diseases classified elsewhere: Secondary | ICD-10-CM

## 2015-01-04 DIAGNOSIS — N760 Acute vaginitis: Principal | ICD-10-CM

## 2015-01-04 LAB — SURESWAB, VAGINOSIS/VAGINITIS PLUS
ATOPOBIUM VAGINAE: 7.1 Log (cells/mL)
C. ALBICANS, DNA: NOT DETECTED
C. glabrata, DNA: NOT DETECTED
C. parapsilosis, DNA: NOT DETECTED
C. trachomatis RNA, TMA: NOT DETECTED
C. tropicalis, DNA: NOT DETECTED
Gardnerella vaginalis: >8 Log (cells/mL)
LACTOBACILLUS SPECIES: NOT DETECTED Log (cells/mL)
MEGASPHAERA SPECIES: 8 Log (cells/mL)
N. gonorrhoeae RNA, TMA: NOT DETECTED
T. vaginalis RNA, QL TMA: NOT DETECTED

## 2015-01-04 MED ORDER — METRONIDAZOLE 500 MG PO TABS: 500.0000 mg | ORAL_TABLET | Freq: Two times a day (BID) | ORAL | Status: DC

## 2015-01-08 ENCOUNTER — Encounter: Payer: Self-pay | Admitting: Cardiovascular Disease

## 2015-01-08 ENCOUNTER — Ambulatory Visit (INDEPENDENT_AMBULATORY_CARE_PROVIDER_SITE_OTHER): Payer: 59 | Admitting: Cardiovascular Disease

## 2015-01-08 VITALS — BP 160/110 | HR 108 | Ht 64.0 in | Wt 263.0 lb

## 2015-01-08 DIAGNOSIS — R0789 Other chest pain: Secondary | ICD-10-CM | POA: Diagnosis not present

## 2015-01-08 MED ORDER — CARVEDILOL 6.25 MG PO TABS: 6.2500 mg | ORAL_TABLET | Freq: Two times a day (BID) | ORAL | Status: DC

## 2015-01-08 NOTE — Patient Instructions (Addendum)
Medication Instructions:  RESTART Coreg (Carvedilol) at 6.25 mg twice daily   Labwork: None Ordered    Testing/Procedures: Your physician has requested that you have a lexiscan myoview. For further information please visit [email or web address]. Please follow instruction sheet, as given.    Follow-Up: Your physician recommends that you schedule a follow-up appointment in: 3 months with Dr. Acie Fredrickson.    Marland Kitchen

## 2015-01-08 NOTE — Progress Notes (Signed)
Cardiology Office Note   Date:  01/08/2015   ID:  Kimberly Frazier, DOB 1961/03/13, MRN [id number]  PCP:  Kimberly Millers, MD  Cardiologist:   Kimberly Headings, MD   Chief Complaint  Patient presents with  . Follow-up    HTN   1. Essential hypertension 2. Diabetes mellitus 3. Hyperlipidemia 4. Morbid obesity 5. Palpitations 6. R carotid stenosis - 50 % by CT angiogram Feb. 2016.    History of Present Illness: Kimberly Frazier is a 54 y.o. female who presents for evaluation of some mild CP. She has DOE on occasion.   This has worsened over the past several month  Also has difficulty keeping her BP regulated.   THe CP is very atypical, worse with lying down.  Tends to go to her left side and arm.  She does comment on some chest tightness / fagitue with exertion. Does not exercise regularly. Works Scientist, research (medical).   She has tried to limit her fast foods recently.  Has also tried to use less salt.   Non smoker Some ETOH.  + Fhx - cardiac problems, mother died of MI,  Brothers died from MI  Has been gradually worsening over the past several weeks.    January 08, 2015:  [name] is seen back today for HTN Has occasional episodes of CP - like a brick in her chest .  She was given Coreg during her last visit but she does not have any bottle of coreg at home Also has run out of his Lisinopril for 5 days   Past Medical History  Diagnosis Date  . Hypertension   . Diabetes mellitus   . Arthritis   . Depression   . Hyperlipidemia     Past Surgical History  Procedure Laterality Date  . Breast biopsy  2012     Current Outpatient Prescriptions  Medication Sig Dispense Refill  . atorvastatin (LIPITOR) 40 MG tablet Take 1 tablet (40 mg total) by mouth daily. 31 tablet 11  . glimepiride (AMARYL) 4 MG tablet TAKE ONE TABLET BY MOUTH ONE TIME DAILY with breakfast 30 tablet 5  . glucose blood (FREESTYLE LITE) test strip Use as instructed 100 each 12  . insulin aspart protamine- aspart  (NOVOLOG MIX 70/30) (70-30) 100 UNIT/ML injection Inject 0.62 mLs (62 Units total) into the skin 2 (two) times daily with a meal. 30 mL 5  . Insulin Syringe-Needle U-100 (BD INSULIN SYRINGE ULTRAFINE) 31G X 5/16" 1 ML MISC Use as directed to inject insulin once daily 100 each 3  . Liniments (SALONPAS) PADS Apply 1 each topically at bedtime as needed (foot pain).    Marland Kitchen lisinopril-hydrochlorothiazide (ZESTORETIC) 20-12.5 MG per tablet Take 2 tablets by mouth daily. 60 tablet 6  . potassium chloride SA (K-DUR,KLOR-CON) 20 MEQ tablet TAKE ONE TABLET BY MOUTH ONE TIME DAILY 30 tablet 2  . carvedilol (COREG) 3.125 MG tablet Take 1 tablet (3.125 mg total) by mouth 2 (two) times daily. (Patient not taking: Reported on 01/08/2015) 62 tablet 11   No current facility-administered medications for this visit.    Allergies:   Review of patient's allergies indicates no known allergies.    Social History:  The patient  reports that she has never smoked. She has never used smokeless tobacco. She reports that she drinks alcohol. She reports that she uses illicit drugs (Marijuana).   Family History:  The patient's family history includes Arthritis in her other; Cancer in her father and other; Diabetes in her mother  and other; Heart disease in her mother, other, and sister; Hyperlipidemia in her other; Hypertension in her mother and other.    ROS:  Please see the history of present illness.    Review of Systems: Constitutional:  denies fever, chills, diaphoresis, appetite change and fatigue.  HEENT: denies photophobia, eye pain, redness, hearing loss, ear pain, congestion, sore throat, rhinorrhea, sneezing, neck pain, neck stiffness and tinnitus.  Respiratory: denies SOB, DOE, cough, chest tightness, and wheezing.  Cardiovascular: admits to chest pain, - like  Brick in her chest   Gastrointestinal: denies nausea, vomiting, abdominal pain, diarrhea, constipation, blood in stool.  Genitourinary: denies dysuria,  urgency, frequency, hematuria, flank pain and difficulty urinating.  Musculoskeletal: denies  myalgias, back pain, joint swelling, arthralgias and gait problem.   Skin: denies pallor, rash and wound.  Neurological: denies dizziness, seizures, syncope, weakness, light-headedness, numbness and headaches.   Hematological: denies adenopathy, easy bruising, personal or family bleeding history.  Psychiatric/ Behavioral: denies suicidal ideation, mood changes, confusion, nervousness, sleep disturbance and agitation.       All other systems are reviewed and negative.    PHYSICAL EXAM: VS:  BP 160/110 mmHg  Pulse 108  Ht 5\' 4"  (1.626 m)  Wt 119.296 kg (263 lb)  BMI 45.12 kg/m2  SpO2 97% , BMI Body mass index is 45.12 kg/(m^2). GEN: obese female  HEENT: normal Neck: no JVD, carotid bruits,  Bounding carotid pulses , no  masses Cardiac: RRR; no murmurs, rubs, or gallops,no edema  Respiratory:  clear to auscultation bilaterally, normal work of breathing GI: soft, nontender, nondistended, + BS MS: no deformity or atrophy Skin: warm and dry, no rash Neuro:  Strength and sensation are intact Psych: normal   EKG:  EKG is ordered today. The ekg ordered today demonstrates normal sinus rhythm at 82 beats a minute. Minimal criteria for left ventricular hypertrophy.   Recent Labs: 06/16/2014: Hemoglobin 15.0; Platelets 197 10/24/2014: ALT 22; BUN 15; Creatinine, Ser 0.88; Potassium 3.1*; Sodium 137    Lipid Panel    Component Value Date/Time   CHOL 155 10/24/2014 0918   TRIG 122.0 10/24/2014 0918   HDL 36.40* 10/24/2014 0918   CHOLHDL 4 10/24/2014 0918   VLDL 24.4 10/24/2014 0918   LDLCALC 94 10/24/2014 0918   LDLDIRECT 138.3 01/16/2013 1449      Wt Readings from Last 3 Encounters:  01/08/15 119.296 kg (263 lb)  01/01/15 118.933 kg (262 lb 3.2 oz)  10/14/14 119.296 kg (263 lb)      Other studies Reviewed: Additional studies/ records that were reviewed today include: . Review  of the above records demonstrates:    ASSESSMENT AND PLAN:  1. Essential hypertension - [name] is seen today for follow up of her hypertension. She has been off her lisinopril HCT for about 5 days because she ran out. She also quit taking the carvedilol. We will restart the carvedilol 6.25 mg twice a day. I have encouraged her to walk on a regular basis. I'll see her back in 3 months for follow-up visit.  2. Diabetes mellitus - further plans per her medical doctor.  3. Hyperlipidemia- -she's currently on  atorvastatin 40 mg a day.  4. Morbid obesity- I've encouraged her to exercise on a daily basis. She'll start exercising 5 or 10 minutes a day and will add 5 minutes each week.  5. R carotid stenosis - 50 % by CT angiogram Feb. 2016. - I do not hear any carotid bruit. Her carotid pulse is bounding.  Continue  aspirin 81 mg a day.   Current medicines are reviewed at length with the patient today.  The patient does not have concerns regarding medicines.  The following changes have been made:  no change   Disposition:   FU with me in 3 months.  Will get fasting labs at that time.     Signed, Harel Repetto, Wonda Cheng, MD  01/08/2015 2:26 PM    [address], [place], [place]  [address] Phone: [phone]; Fax: [phone]

## 2015-01-09 ENCOUNTER — Ambulatory Visit (HOSPITAL_COMMUNITY): Admission: RE | Admit: 2015-01-09 | Payer: 59 | Source: Ambulatory Visit

## 2015-01-15 ENCOUNTER — Telehealth (HOSPITAL_COMMUNITY): Payer: Self-pay

## 2015-01-15 NOTE — Telephone Encounter (Signed)
Left message on voicemail in reference to upcoming appointment scheduled for 01-20-2015. Phone number given for a call back so details instructions can be given. Gurinder Toral A   

## 2015-01-16 ENCOUNTER — Telehealth (HOSPITAL_COMMUNITY): Payer: Self-pay

## 2015-01-16 NOTE — Telephone Encounter (Signed)
Patient given detailed instructions per Myocardial Perfusion Study Information Sheet for test on 01-20-2015 at 0900. Patient Notified to arrive 15 minutes early, and that it is imperative to arrive on time for appointment to keep from having the test rescheduled. Patient verbalized understanding. Oletta Lamas, Mattthew Ziomek A

## 2015-01-20 ENCOUNTER — Ambulatory Visit (HOSPITAL_COMMUNITY): Payer: 59 | Attending: Internal Medicine

## 2015-01-20 DIAGNOSIS — R0789 Other chest pain: Secondary | ICD-10-CM

## 2015-01-20 DIAGNOSIS — R9439 Abnormal result of other cardiovascular function study: Secondary | ICD-10-CM | POA: Insufficient documentation

## 2015-01-20 DIAGNOSIS — R0609 Other forms of dyspnea: Secondary | ICD-10-CM | POA: Diagnosis not present

## 2015-01-20 MED ORDER — REGADENOSON 0.4 MG/5ML IV SOLN
0.4000 mg | Freq: Once | INTRAVENOUS | Status: AC
  Administered 2015-01-20: 0.4 mg via INTRAVENOUS

## 2015-01-20 MED ORDER — TECHNETIUM TC 99M SESTAMIBI GENERIC - CARDIOLITE
32.7000 | Freq: Once | INTRAVENOUS | Status: AC | PRN
  Administered 2015-01-20: 32.7 via INTRAVENOUS

## 2015-01-21 ENCOUNTER — Ambulatory Visit (HOSPITAL_COMMUNITY): Payer: 59 | Attending: Cardiovascular Disease

## 2015-01-21 LAB — MYOCARDIAL PERFUSION IMAGING
CSEPPHR: 105 {beats}/min
LHR: 0.2
LV dias vol: 73 mL
LVSYSVOL: 29 mL
NUC STRESS TID: 0.96
Rest HR: 88 {beats}/min
SDS: 0
SRS: 8
SSS: 8

## 2015-01-21 MED ORDER — TECHNETIUM TC 99M SESTAMIBI GENERIC - CARDIOLITE
32.2000 | Freq: Once | INTRAVENOUS | Status: AC | PRN
  Administered 2015-01-21: 32.2 via INTRAVENOUS

## 2015-01-22 ENCOUNTER — Encounter: Payer: Self-pay | Admitting: Nurse Practitioner

## 2015-01-22 ENCOUNTER — Telehealth: Payer: Self-pay | Admitting: Nurse Practitioner

## 2015-01-22 ENCOUNTER — Telehealth: Payer: Self-pay | Admitting: *Deleted

## 2015-01-22 DIAGNOSIS — R0789 Other chest pain: Secondary | ICD-10-CM

## 2015-01-22 DIAGNOSIS — R9439 Abnormal result of other cardiovascular function study: Secondary | ICD-10-CM

## 2015-01-22 MED ORDER — NITROGLYCERIN 0.4 MG SL SUBL: SUBLINGUAL_TABLET | SUBLINGUAL | Status: DC

## 2015-01-22 NOTE — Telephone Encounter (Signed)
-----   Message from Thayer Headings, MD sent at 01/21/2015  6:29 PM EDT ----- Her Brantley Fling is low risk but she has a small reversible defect in her lateral wall and  has symptoms that are consistent with ischemia Also has family hx of CAD  Please set her up with a cardiac cath .  I have called an left a message on her voicemail. We will call her back tomorrow

## 2015-01-22 NOTE — Telephone Encounter (Signed)
Spoke with patient and reviewed Dr. Elmarie Shiley advice with her.  She is in agreement to proceed with cardiac cath but would like to schedule for 9/8.  Dr. Acie Fredrickson is in agreement as long as patient will go to ED with worsening symptoms.  He has prescribed NTG 0.4 mg SL and I have instructed her on how to use these.  Her cath is scheduled for 9/8 at 0900 with Dr. Burt Knack.  She is aware to arrive at 0700.  Her pre-cath lab appointment is scheduled for 9/1.  I have reviewed pre-procedure instructions with her and am placing a copy in outgoing mail.  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  I advised I will review with Dr. Acie Fredrickson and mail with the procedure instructions.  She verbalized understanding and agreement with plan of care and will call back with questions or concerns.

## 2015-01-22 NOTE — Telephone Encounter (Signed)
Pt states she has contacted her insurance concerning the Novolog insulin. Med is costing her $160 month, and she can't afford. The insurance states the alternative would be Humalog. Requesting md to rx Humalog. Also she is requesting a referral to see Urologist.../lmb

## 2015-01-23 NOTE — Telephone Encounter (Signed)
Pt return call back gave her md response. appt made for tomorrow @ 3:30...Kimberly Frazier

## 2015-01-23 NOTE — Telephone Encounter (Signed)
She is overdue for visit and needs visit to evaluate regimen. Needs to bring sugar readings with her as previous regimen was not controlling her sugars well.

## 2015-01-23 NOTE — Telephone Encounter (Signed)
Pls advise on msg below.../lmb 

## 2015-01-23 NOTE — Telephone Encounter (Signed)
Called pt no answer LMOM RTC.../lmb 

## 2015-01-24 ENCOUNTER — Encounter: Payer: Self-pay | Admitting: Internal Medicine

## 2015-01-24 ENCOUNTER — Ambulatory Visit (INDEPENDENT_AMBULATORY_CARE_PROVIDER_SITE_OTHER): Payer: 59 | Admitting: Internal Medicine

## 2015-01-24 ENCOUNTER — Other Ambulatory Visit (INDEPENDENT_AMBULATORY_CARE_PROVIDER_SITE_OTHER): Payer: 59

## 2015-01-24 VITALS — BP 140/84 | HR 86 | Temp 98.2°F | Resp 18 | Ht 64.0 in | Wt 257.0 lb

## 2015-01-24 DIAGNOSIS — I10 Essential (primary) hypertension: Secondary | ICD-10-CM

## 2015-01-24 DIAGNOSIS — IMO0002 Reserved for concepts with insufficient information to code with codable children: Secondary | ICD-10-CM

## 2015-01-24 DIAGNOSIS — E118 Type 2 diabetes mellitus with unspecified complications: Secondary | ICD-10-CM

## 2015-01-24 DIAGNOSIS — E1165 Type 2 diabetes mellitus with hyperglycemia: Secondary | ICD-10-CM | POA: Diagnosis not present

## 2015-01-24 LAB — COMPREHENSIVE METABOLIC PANEL
ALT: 21 U/L (ref 0–35)
AST: 15 U/L (ref 0–37)
Albumin: 3.9 g/dL (ref 3.5–5.2)
Alkaline Phosphatase: 98 U/L (ref 39–117)
BUN: 24 mg/dL — AB (ref 6–23)
CHLORIDE: 96 meq/L (ref 96–112)
CO2: 34 mEq/L — ABNORMAL HIGH (ref 19–32)
Calcium: 9.8 mg/dL (ref 8.4–10.5)
Creatinine, Ser: 1.05 mg/dL (ref 0.40–1.20)
GFR: 69.61 mL/min (ref >60.00)
GLUCOSE: 345 mg/dL — AB (ref 70–99)
POTASSIUM: 3.3 meq/L — AB (ref 3.5–5.1)
SODIUM: 138 meq/L (ref 135–145)
Total Bilirubin: 0.7 mg/dL (ref 0.2–1.2)
Total Protein: 7.6 g/dL (ref 6.0–8.3)

## 2015-01-24 LAB — HEMOGLOBIN A1C: HEMOGLOBIN A1C: 13.2 % — AB (ref 4.6–6.5)

## 2015-01-24 MED ORDER — INSULIN LISPRO PROT & LISPRO (75-25 MIX) 100 UNIT/ML KWIKPEN
70.0000 [IU] | PEN_INJECTOR | Freq: Two times a day (BID) | SUBCUTANEOUS | Status: DC
Start: 2015-01-24 — End: 2016-01-19

## 2015-01-24 NOTE — Progress Notes (Signed)
Pre visit review using our clinic review tool, if applicable. No additional management support is needed unless otherwise documented below in the visit note. 

## 2015-01-24 NOTE — Patient Instructions (Signed)
We have sent in the humalog mix pens to your pharmacy to see how the insurance will cover them. Continue taking 70 units twice a day and make sure to check your blood sugar at least twice per day.   Please go to the lab to get your blood work done.   Diabetes and Exercise Exercising regularly is important. It is not just about losing weight. It has many health benefits, such as:  Improving your overall fitness, flexibility, and endurance.  Increasing your bone density.  Helping with weight control.  Decreasing your body fat.  Increasing your muscle strength.  Reducing stress and tension.  Improving your overall health. People with diabetes who exercise gain additional benefits because exercise:  Reduces appetite.  Improves the body's use of blood sugar (glucose).  Helps lower or control blood glucose.  Decreases blood pressure.  Helps control blood lipids (such as cholesterol and triglycerides).  Improves the body's use of the hormone insulin by:  Increasing the body's insulin sensitivity.  Reducing the body's insulin needs.  Decreases the risk for heart disease because exercising:  Lowers cholesterol and triglycerides levels.  Increases the levels of good cholesterol (such as high-density lipoproteins [HDL]) in the body.  Lowers blood glucose levels. YOUR ACTIVITY PLAN  Choose an activity that you enjoy and set realistic goals. Your health care provider or diabetes educator can help you make an activity plan that works for you. Exercise regularly as directed by your health care provider. This includes:  Performing resistance training twice a week such as push-ups, sit-ups, lifting weights, or using resistance bands.  Performing 150 minutes of cardio exercises each week such as walking, running, or playing sports.  Staying active and spending no more than 90 minutes at one time being inactive. Even short bursts of exercise are good for you. Three 10-minute  sessions spread throughout the day are just as beneficial as a single 30-minute session. Some exercise ideas include:  Taking the dog for a walk.  Taking the stairs instead of the elevator.  Dancing to your favorite song.  Doing an exercise video.  Doing your favorite exercise with a friend. RECOMMENDATIONS FOR EXERCISING WITH TYPE 1 OR TYPE 2 DIABETES   Check your blood glucose before exercising. If blood glucose levels are greater than 240 mg/dL, check for urine ketones. Do not exercise if ketones are present.  Avoid injecting insulin into areas of the body that are going to be exercised. For example, avoid injecting insulin into:  The arms when playing tennis.  The legs when jogging.  Keep a record of:  Food intake before and after you exercise.  Expected peak times of insulin action.  Blood glucose levels before and after you exercise.  The type and amount of exercise you have done.  Review your records with your health care provider. Your health care provider will help you to develop guidelines for adjusting food intake and insulin amounts before and after exercising.  If you take insulin or oral hypoglycemic agents, watch for signs and symptoms of hypoglycemia. They include:  Dizziness.  Shaking.  Sweating.  Chills.  Confusion.  Drink plenty of water while you exercise to prevent dehydration or heat stroke. Body water is lost during exercise and must be replaced.  Talk to your health care provider before starting an exercise program to make sure it is safe for you. Remember, almost any type of activity is better than none. Document Released: 08/07/2003 Document Revised: 10/01/2013 Document Reviewed: 10/24/2012 ExitCare Patient  Information 2015 [employer]. This information is not intended to replace advice given to you by your health care provider. Make sure you discuss any questions you have with your health care provider.  

## 2015-01-26 NOTE — Assessment & Plan Note (Addendum)
She did not return as instructed in June and severely exacerbated at this time. Stable neuropathy in her feet. Not well controlled and will be worse today since she has not consistently been using her insulin since last time. Did not bring her sugar log with her so unable to confirm good control with insulin. Change novolog 70/30 to humalog 75/25 mix and continue 70 units BID for now. She is also taking amaryl, denies hypoglycemia. Reminded about yearly eye exam but she has not gone in several years.

## 2015-01-26 NOTE — Assessment & Plan Note (Signed)
Improved from last time, will maintain regimen although slightly high today of lisinopril/hctz and carvedilol. Checking CMP.

## 2015-01-26 NOTE — Progress Notes (Signed)
   Subjective:    Patient ID: Kimberly Frazier, female    DOB: Sep 15, 1960, 54 y.o.   MRN: [id number]  HPI The patient is a 54 YO female who is coming in for follow up of her diabetes. She has been out of her insulin for about 3 weeks without letting us know due to cost. She would like to switch today as she believes the humalog would be less expensive than her current novolog 70/30 mix. Previously she was doing a little better on the changed dose of her insulin when she was able to take it. Denies hypoglycemia but is having hyperglycemia since she is out of her insulin. Not exercising much lately.   Review of Systems  Constitutional: Negative for fever, activity change, appetite change, fatigue and unexpected weight change.  Respiratory: Negative for cough, chest tightness, shortness of breath and wheezing.   Cardiovascular: Negative for chest pain, palpitations and leg swelling.  Gastrointestinal: Negative for nausea, abdominal pain, diarrhea, constipation and abdominal distention.  Musculoskeletal: Positive for arthralgias. Negative for back pain, gait problem and neck pain.  Neurological: Negative for dizziness, weakness and headaches.      Objective:   Physical Exam  Constitutional: She is oriented to person, place, and time. She appears well-developed and well-nourished.  HENT:  Head: Normocephalic and atraumatic.  Eyes: EOM are normal.  Neck: Normal range of motion.  Cardiovascular: Normal rate and regular rhythm.   Pulmonary/Chest: Effort normal and breath sounds normal. No respiratory distress. She has no wheezes. She has no rales.  Abdominal: Soft. Bowel sounds are normal. She exhibits no distension. There is no tenderness. There is no rebound.  Musculoskeletal: She exhibits no edema.  Neurological: She is alert and oriented to person, place, and time. Coordination normal.  Skin: Skin is warm and dry.   Filed Vitals:   01/24/15 1531  BP: 140/84  Pulse: 86  Temp: 98.2 F (36.8  C)  TempSrc: Oral  Resp: 18  Height: 5\' 4"  (1.626 m)  Weight: 257 lb (116.574 kg)  SpO2: 96%      Assessment & Plan:

## 2015-01-30 ENCOUNTER — Other Ambulatory Visit (INDEPENDENT_AMBULATORY_CARE_PROVIDER_SITE_OTHER): Payer: 59 | Admitting: *Deleted

## 2015-01-30 ENCOUNTER — Other Ambulatory Visit: Payer: Self-pay | Admitting: Internal Medicine

## 2015-01-30 DIAGNOSIS — R0789 Other chest pain: Secondary | ICD-10-CM

## 2015-01-30 DIAGNOSIS — IMO0002 Reserved for concepts with insufficient information to code with codable children: Secondary | ICD-10-CM

## 2015-01-30 DIAGNOSIS — R9439 Abnormal result of other cardiovascular function study: Secondary | ICD-10-CM | POA: Diagnosis not present

## 2015-01-30 DIAGNOSIS — E1165 Type 2 diabetes mellitus with hyperglycemia: Secondary | ICD-10-CM

## 2015-01-30 LAB — CBC WITH DIFFERENTIAL/PLATELET
BASOS PCT: 0.2 % (ref 0.0–3.0)
Basophils Absolute: 0 10*3/uL (ref 0.0–0.1)
Eosinophils Absolute: 0.1 10*3/uL (ref 0.0–0.7)
Eosinophils Relative: 0.8 % (ref 0.0–5.0)
HEMATOCRIT: 41 % (ref 36.0–46.0)
Hemoglobin: 13.6 g/dL (ref 12.0–15.0)
LYMPHS PCT: 25.3 % (ref 12.0–46.0)
Lymphs Abs: 2.5 10*3/uL (ref 0.7–4.0)
MCHC: 33.1 g/dL (ref 30.0–36.0)
MCV: 89.1 fl (ref 78.0–100.0)
MONOS PCT: 5.5 % (ref 3.0–12.0)
Monocytes Absolute: 0.6 10*3/uL (ref 0.1–1.0)
NEUTROS ABS: 6.8 10*3/uL (ref 1.4–7.7)
Neutrophils Relative %: 68.2 % (ref 43.0–77.0)
PLATELETS: 178 10*3/uL (ref 150.0–400.0)
RBC: 4.61 Mil/uL (ref 3.87–5.11)
RDW: 13.3 % (ref 11.5–15.5)
WBC: 10 10*3/uL (ref 4.0–10.5)

## 2015-01-30 LAB — BASIC METABOLIC PANEL
BUN: 17 mg/dL (ref 6–23)
CHLORIDE: 96 meq/L (ref 96–112)
CO2: 27 mEq/L (ref 19–32)
CREATININE: 0.99 mg/dL (ref 0.40–1.20)
Calcium: 9.2 mg/dL (ref 8.4–10.5)
GFR: 74.5 mL/min (ref >60.00)
Glucose, Bld: 490 mg/dL — ABNORMAL HIGH (ref 70–99)
Potassium: 3.5 mEq/L (ref 3.5–5.1)
Sodium: 135 mEq/L (ref 135–145)

## 2015-01-30 LAB — PROTIME-INR
INR: 1 ratio (ref 0.8–1.0)
Prothrombin Time: 11.5 s (ref 9.6–13.1)

## 2015-02-06 ENCOUNTER — Encounter (HOSPITAL_COMMUNITY)
Admission: RE | Disposition: A | Discharge status: Home / Self Care | Payer: Self-pay | Source: Ambulatory Visit | Attending: Cardiovascular Disease

## 2015-02-06 ENCOUNTER — Encounter (HOSPITAL_COMMUNITY): Payer: Self-pay | Admitting: Cardiology

## 2015-02-06 ENCOUNTER — Ambulatory Visit (HOSPITAL_COMMUNITY)
Admission: RE | Admit: 2015-02-06 | Discharge: 2015-02-06 | Disposition: A | Discharge status: Home / Self Care | Payer: 59 | Source: Ambulatory Visit | Attending: Cardiovascular Disease | Admitting: Cardiovascular Disease

## 2015-02-06 DIAGNOSIS — F329 Major depressive disorder, single episode, unspecified: Secondary | ICD-10-CM | POA: Diagnosis not present

## 2015-02-06 DIAGNOSIS — R002 Palpitations: Secondary | ICD-10-CM | POA: Diagnosis not present

## 2015-02-06 DIAGNOSIS — Z8249 Family history of ischemic heart disease and other diseases of the circulatory system: Secondary | ICD-10-CM | POA: Insufficient documentation

## 2015-02-06 DIAGNOSIS — E785 Hyperlipidemia, unspecified: Secondary | ICD-10-CM | POA: Insufficient documentation

## 2015-02-06 DIAGNOSIS — E119 Type 2 diabetes mellitus without complications: Secondary | ICD-10-CM | POA: Diagnosis not present

## 2015-02-06 DIAGNOSIS — I6521 Occlusion and stenosis of right carotid artery: Secondary | ICD-10-CM | POA: Insufficient documentation

## 2015-02-06 DIAGNOSIS — Z6841 Body Mass Index (BMI) 40.0 and over, adult: Secondary | ICD-10-CM | POA: Diagnosis not present

## 2015-02-06 DIAGNOSIS — I10 Essential (primary) hypertension: Secondary | ICD-10-CM | POA: Diagnosis not present

## 2015-02-06 DIAGNOSIS — I2510 Atherosclerotic heart disease of native coronary artery without angina pectoris: Secondary | ICD-10-CM | POA: Diagnosis not present

## 2015-02-06 DIAGNOSIS — Z794 Long term (current) use of insulin: Secondary | ICD-10-CM | POA: Insufficient documentation

## 2015-02-06 DIAGNOSIS — Z7982 Long term (current) use of aspirin: Secondary | ICD-10-CM | POA: Insufficient documentation

## 2015-02-06 DIAGNOSIS — R079 Chest pain, unspecified: Secondary | ICD-10-CM | POA: Diagnosis present

## 2015-02-06 HISTORY — PX: CARDIAC CATHETERIZATION: SHX172

## 2015-02-06 LAB — GLUCOSE, CAPILLARY: Glucose-Capillary: 282 mg/dL — ABNORMAL HIGH (ref 65–99)

## 2015-02-06 SURGERY — LEFT HEART CATH AND CORONARY ANGIOGRAPHY
Anesthesia: LOCAL

## 2015-02-06 MED ORDER — NITROGLYCERIN 1 MG/10 ML FOR IR/CATH LAB
INTRA_ARTERIAL | Status: DC | PRN
  Administered 2015-02-06 (×2): 200 ug via INTRA_ARTERIAL

## 2015-02-06 MED ORDER — SODIUM CHLORIDE 0.9 % IJ SOLN
3.0000 mL | INTRAMUSCULAR | Status: DC | PRN
Start: 2015-02-06 — End: 2015-02-06
  Administered 2015-02-06: 3 mL via INTRAVENOUS
  Filled 2015-02-06: qty 3

## 2015-02-06 MED ORDER — FENTANYL CITRATE (PF) 100 MCG/2ML IJ SOLN
INTRAMUSCULAR | Status: AC
  Filled 2015-02-06: qty 4

## 2015-02-06 MED ORDER — MIDAZOLAM HCL 2 MG/2ML IJ SOLN
INTRAMUSCULAR | Status: AC
  Filled 2015-02-06: qty 4

## 2015-02-06 MED ORDER — VERAPAMIL HCL 2.5 MG/ML IV SOLN
INTRAVENOUS | Status: DC | PRN
  Administered 2015-02-06: 09:00:00 via INTRA_ARTERIAL

## 2015-02-06 MED ORDER — SODIUM CHLORIDE 0.9 % WEIGHT BASED INFUSION
3.0000 mL/kg/h | INTRAVENOUS | Status: AC
  Administered 2015-02-06: 3 mL/kg/h via INTRAVENOUS

## 2015-02-06 MED ORDER — ACETAMINOPHEN 325 MG PO TABS: 650.0000 mg | ORAL_TABLET | ORAL | Status: DC | PRN

## 2015-02-06 MED ORDER — SODIUM CHLORIDE 0.9 % IV SOLN: 250.0000 mL | INTRAVENOUS | Status: DC | PRN

## 2015-02-06 MED ORDER — ASPIRIN 81 MG PO CHEW
81.0000 mg | CHEWABLE_TABLET | ORAL | Status: AC
  Administered 2015-02-06: 81 mg via ORAL

## 2015-02-06 MED ORDER — LIDOCAINE HCL (PF) 1 % IJ SOLN
INTRAMUSCULAR | Status: DC | PRN
  Administered 2015-02-06 (×3)

## 2015-02-06 MED ORDER — LIDOCAINE HCL (PF) 1 % IJ SOLN
INTRAMUSCULAR | Status: AC
  Filled 2015-02-06: qty 30

## 2015-02-06 MED ORDER — MIDAZOLAM HCL 2 MG/2ML IJ SOLN
INTRAMUSCULAR | Status: DC | PRN
  Administered 2015-02-06: 1 mg via INTRAVENOUS

## 2015-02-06 MED ORDER — SODIUM CHLORIDE 0.9 % IJ SOLN: 3.0000 mL | Freq: Two times a day (BID) | INTRAMUSCULAR | Status: DC

## 2015-02-06 MED ORDER — IOHEXOL 350 MG/ML SOLN
INTRAVENOUS | Status: DC | PRN
  Administered 2015-02-06: 215 mL via INTRAVENOUS

## 2015-02-06 MED ORDER — HEPARIN (PORCINE) IN NACL 2-0.9 UNIT/ML-% IJ SOLN
INTRAMUSCULAR | Status: AC
  Filled 2015-02-06: qty 1000

## 2015-02-06 MED ORDER — ONDANSETRON HCL 4 MG/2ML IJ SOLN: 4.0000 mg | Freq: Four times a day (QID) | INTRAMUSCULAR | Status: DC | PRN

## 2015-02-06 MED ORDER — HEPARIN SODIUM (PORCINE) 1000 UNIT/ML IJ SOLN
INTRAMUSCULAR | Status: DC | PRN
  Administered 2015-02-06: 5000 [IU] via INTRAVENOUS

## 2015-02-06 MED ORDER — NITROGLYCERIN 1 MG/10 ML FOR IR/CATH LAB
INTRA_ARTERIAL | Status: AC
  Filled 2015-02-06: qty 10

## 2015-02-06 MED ORDER — ASPIRIN 81 MG PO CHEW
CHEWABLE_TABLET | ORAL | Status: AC
  Filled 2015-02-06: qty 1

## 2015-02-06 MED ORDER — FENTANYL CITRATE (PF) 100 MCG/2ML IJ SOLN
INTRAMUSCULAR | Status: DC | PRN
  Administered 2015-02-06: 25 ug via INTRAVENOUS

## 2015-02-06 MED ORDER — SODIUM CHLORIDE 0.9 % WEIGHT BASED INFUSION
1.0000 mL/kg/h | INTRAVENOUS | Status: DC
  Administered 2015-02-06: 1 mL/kg/h via INTRAVENOUS

## 2015-02-06 MED ORDER — SODIUM CHLORIDE 0.9 % IJ SOLN: 3.0000 mL | INTRAMUSCULAR | Status: DC | PRN

## 2015-02-06 MED ORDER — VERAPAMIL HCL 2.5 MG/ML IV SOLN
INTRAVENOUS | Status: AC
  Filled 2015-02-06: qty 2

## 2015-02-06 MED ORDER — SODIUM CHLORIDE 0.9 % WEIGHT BASED INFUSION: 3.0000 mL/kg/h | INTRAVENOUS | Status: DC

## 2015-02-06 MED ORDER — HEPARIN SODIUM (PORCINE) 1000 UNIT/ML IJ SOLN
INTRAMUSCULAR | Status: AC
  Filled 2015-02-06: qty 1

## 2015-02-06 SURGICAL SUPPLY — 18 items
CATH INFINITI 5 FR 3DRC (CATHETERS) ×1 IMPLANT
CATH INFINITI 5 FR AR1 MOD (CATHETERS) ×1 IMPLANT
CATH INFINITI 5 FR JL3.5 (CATHETERS) ×2 IMPLANT
CATH INFINITI 5 FR MPA2 (CATHETERS) ×1 IMPLANT
CATH INFINITI 5FR AL1 (CATHETERS) ×1 IMPLANT
CATH INFINITI 5FR ANG PIGTAIL (CATHETERS) ×2 IMPLANT
CATH INFINITI JR4 5F (CATHETERS) ×2 IMPLANT
CATH LAUNCHER 5F RADR (CATHETERS) IMPLANT
CATHETER LAUNCHER 5F RADR (CATHETERS) ×2
DEVICE RAD COMP TR BAND LRG (VASCULAR PRODUCTS) ×2 IMPLANT
GLIDESHEATH SLEND SS 6F .021 (SHEATH) ×2 IMPLANT
KIT HEART LEFT (KITS) ×2 IMPLANT
PACK CARDIAC CATHETERIZATION (CUSTOM PROCEDURE TRAY) ×2 IMPLANT
SYR MEDRAD MARK V 150ML (SYRINGE) ×2 IMPLANT
TRANSDUCER W/STOPCOCK (MISCELLANEOUS) ×2 IMPLANT
TUBING CIL FLEX 10 FLL-RA (TUBING) ×2 IMPLANT
WIRE HI TORQ VERSACORE-J 145CM (WIRE) ×1 IMPLANT
WIRE SAFE-T 1.5MM-J .035X260CM (WIRE) ×2 IMPLANT

## 2015-02-06 NOTE — Research (Signed)
CAD-LAD Informed Consent   Subject Name: Kesi Perrow  Subject met inclusion and exclusion criteria.  The informed consent form, study requirements and expectations were reviewed with the subject and questions and concerns were addressed prior to the signing of the consent form.  The subject verbalized understanding of the trail requirements.  The subject agreed to participate in the CAD-LAD trial and signed the informed consent.  The informed consent was obtained prior to performance of any protocol-specific procedures for the subject.  A copy of the signed informed consent was given to the subject and a copy was placed in the subject's medical record.  Hedrick,Tammy W 02/06/2015, 0800

## 2015-02-06 NOTE — Discharge Instructions (Signed)
Radial Site Care °Refer to this sheet in the next few weeks. These instructions provide you with information on caring for yourself after your procedure. Your caregiver may also give you more specific instructions. Your treatment has been planned according to current medical practices, but problems sometimes occur. Call your caregiver if you have any problems or questions after your procedure. °HOME CARE INSTRUCTIONS °· You may shower the day after the procedure. Remove the bandage (dressing) and gently wash the site with plain soap and water. Gently pat the site dry. °· Do not apply powder or lotion to the site. °· Do not submerge the affected site in water for 3 to 5 days. °· Inspect the site at least twice daily. °· Do not flex or bend the affected arm for 24 hours. °· No lifting over 5 pounds (2.3 kg) for 5 days after your procedure. °· Do not drive home if you are discharged the same day of the procedure. Have someone else drive you. °· You may drive 24 hours after the procedure unless otherwise instructed by your caregiver. °· Do not operate machinery or power tools for 24 hours. °· A responsible adult should be with you for the first 24 hours after you arrive home. °What to expect: °· Any bruising will usually fade within 1 to 2 weeks. °· Blood that collects in the tissue (hematoma) may be painful to the touch. It should usually decrease in size and tenderness within 1 to 2 weeks. °SEEK IMMEDIATE MEDICAL CARE IF: °· You have unusual pain at the radial site. °· You have redness, warmth, swelling, or pain at the radial site. °· You have drainage (other than a small amount of blood on the dressing). °· You have chills. °· You have a fever or persistent symptoms for more than 72 hours. °· You have a fever and your symptoms suddenly get worse. °· Your arm becomes pale, cool, tingly, or numb. °· You have heavy bleeding from the site. Hold pressure on the site and call 911. °Document Released: 06/19/2010 Document  Revised: 08/09/2011 Document Reviewed: 06/19/2010 °ExitCare® Patient Information ©2015 [employer]. This information is not intended to replace advice given to you by your health care provider. Make sure you discuss any questions you have with your health care provider. ° °

## 2015-02-06 NOTE — Interval H&P Note (Signed)
Cath Lab Visit (complete for each Cath Lab visit)  Clinical Evaluation Leading to the Procedure:   ACS: No  Non-ACS:    Anginal Classification: CCS III  Anti-ischemic medical therapy: Minimal Therapy (1 class of medications)  Non-Invasive Test Results: Low-risk stress test findings: cardiac mortality <1%/year  Prior CABG: No previous CABG      History and Physical Interval Note:  02/06/2015 9:13 AM  Kimberly Frazier  has presented today for surgery, with the diagnosis of abnormal myoview, cp  The various methods of treatment have been discussed with the patient and family. After consideration of risks, benefits and other options for treatment, the patient has consented to  Procedure(s): Left Heart Cath and Coronary Angiography (N/A) as a surgical intervention .  The patient's history has been reviewed, patient examined, no change in status, stable for surgery.  I have reviewed the patient's chart and labs.  Questions were answered to the patient's satisfaction.     [employer]

## 2015-02-06 NOTE — H&P (View-Only) (Signed)
 Cardiology Office Note   Date:  01/08/2015   ID:  Kimberly Frazier, DOB 07/03/1960, MRN [id number]  PCP:  Frazier, Kimberly A, MD  Cardiologist:   Kimberly Vacca J, MD   Chief Complaint  Patient presents with  . Follow-up    HTN   1. Essential hypertension 2. Diabetes mellitus 3. Hyperlipidemia 4. Morbid obesity 5. Palpitations 6. R carotid stenosis - 50 % by CT angiogram Feb. 2016.    History of Present Illness: Kimberly Frazier is a 54 y.o. female who presents for evaluation of some mild CP. She has DOE on occasion.   This has worsened over the past several month  Also has difficulty keeping her BP regulated.   THe CP is very atypical, worse with lying down.  Tends to go to her left side and arm.  She does comment on some chest tightness / fagitue with exertion. Does not exercise regularly. Works retail.   She has tried to limit her fast foods recently.  Has also tried to use less salt.   Non smoker Some ETOH.  + Fhx - cardiac problems, mother died of MI,  Brothers died from MI  Has been gradually worsening over the past several weeks.    January 08, 2015:  Kimberly Frazier is seen back today for HTN Has occasional episodes of CP - like a brick in her chest .  She was given Coreg during her last visit but she does not have any bottle of coreg at home Also has run out of his Lisinopril for 5 days   Past Medical History  Diagnosis Date  . Hypertension   . Diabetes mellitus   . Arthritis   . Depression   . Hyperlipidemia     Past Surgical History  Procedure Laterality Date  . Breast biopsy  2012     Current Outpatient Prescriptions  Medication Sig Dispense Refill  . atorvastatin (LIPITOR) 40 MG tablet Take 1 tablet (40 mg total) by mouth daily. 31 tablet 11  . glimepiride (AMARYL) 4 MG tablet TAKE ONE TABLET BY MOUTH ONE TIME DAILY with breakfast 30 tablet 5  . glucose blood (FREESTYLE LITE) test strip Use as instructed 100 each 12  . insulin aspart protamine- aspart  (NOVOLOG MIX 70/30) (70-30) 100 UNIT/ML injection Inject 0.62 mLs (62 Units total) into the skin 2 (two) times daily with a meal. 30 mL 5  . Insulin Syringe-Needle U-100 (BD INSULIN SYRINGE ULTRAFINE) 31G X 5/16" 1 ML MISC Use as directed to inject insulin once daily 100 each 3  . Liniments (SALONPAS) PADS Apply 1 each topically at bedtime as needed (foot pain).    . lisinopril-hydrochlorothiazide (ZESTORETIC) 20-12.5 MG per tablet Take 2 tablets by mouth daily. 60 tablet 6  . potassium chloride SA (K-DUR,KLOR-CON) 20 MEQ tablet TAKE ONE TABLET BY MOUTH ONE TIME DAILY 30 tablet 2  . carvedilol (COREG) 3.125 MG tablet Take 1 tablet (3.125 mg total) by mouth 2 (two) times daily. (Patient not taking: Reported on 01/08/2015) 62 tablet 11   No current facility-administered medications for this visit.    Allergies:   Review of patient's allergies indicates no known allergies.    Social History:  The patient  reports that she has never smoked. She has never used smokeless tobacco. She reports that she drinks alcohol. She reports that she uses illicit drugs (Marijuana).   Family History:  The patient's family history includes Arthritis in her other; Cancer in her father and other; Diabetes in her mother   and other; Heart disease in her mother, other, and sister; Hyperlipidemia in her other; Hypertension in her mother and other.    ROS:  Please see the history of present illness.    Review of Systems: Constitutional:  denies fever, chills, diaphoresis, appetite change and fatigue.  HEENT: denies photophobia, eye pain, redness, hearing loss, ear pain, congestion, sore throat, rhinorrhea, sneezing, neck pain, neck stiffness and tinnitus.  Respiratory: denies SOB, DOE, cough, chest tightness, and wheezing.  Cardiovascular: admits to chest pain, - like  Brick in her chest   Gastrointestinal: denies nausea, vomiting, abdominal pain, diarrhea, constipation, blood in stool.  Genitourinary: denies dysuria,  urgency, frequency, hematuria, flank pain and difficulty urinating.  Musculoskeletal: denies  myalgias, back pain, joint swelling, arthralgias and gait problem.   Skin: denies pallor, rash and wound.  Neurological: denies dizziness, seizures, syncope, weakness, light-headedness, numbness and headaches.   Hematological: denies adenopathy, easy bruising, personal or family bleeding history.  Psychiatric/ Behavioral: denies suicidal ideation, mood changes, confusion, nervousness, sleep disturbance and agitation.       All other systems are reviewed and negative.    PHYSICAL EXAM: VS:  BP 160/110 mmHg  Pulse 108  Ht 5' 4" (1.626 m)  Wt 119.296 kg (263 lb)  BMI 45.12 kg/m2  SpO2 97% , BMI Body mass index is 45.12 kg/(m^2). GEN: obese female  HEENT: normal Neck: no JVD, carotid bruits,  Bounding carotid pulses , no  masses Cardiac: RRR; no murmurs, rubs, or gallops,no edema  Respiratory:  clear to auscultation bilaterally, normal work of breathing GI: soft, nontender, nondistended, + BS MS: no deformity or atrophy Skin: warm and dry, no rash Neuro:  Strength and sensation are intact Psych: normal   EKG:  EKG is ordered today. The ekg ordered today demonstrates normal sinus rhythm at 82 beats a minute. Minimal criteria for left ventricular hypertrophy.   Recent Labs: 06/16/2014: Hemoglobin 15.0; Platelets 197 10/24/2014: ALT 22; BUN 15; Creatinine, Ser 0.88; Potassium 3.1*; Sodium 137    Lipid Panel    Component Value Date/Time   CHOL 155 10/24/2014 0918   TRIG 122.0 10/24/2014 0918   HDL 36.40* 10/24/2014 0918   CHOLHDL 4 10/24/2014 0918   VLDL 24.4 10/24/2014 0918   LDLCALC 94 10/24/2014 0918   LDLDIRECT 138.3 01/16/2013 1449      Wt Readings from Last 3 Encounters:  01/08/15 119.296 kg (263 lb)  01/01/15 118.933 kg (262 lb 3.2 oz)  10/14/14 119.296 kg (263 lb)      Other studies Reviewed: Additional studies/ records that were reviewed today include: . Review  of the above records demonstrates:    ASSESSMENT AND PLAN:  1. Essential hypertension - Kimberly Frazier is seen today for follow up of her hypertension. She has been off her lisinopril HCT for about 5 days because she ran out. She also quit taking the carvedilol. We will restart the carvedilol 6.25 mg twice a day. I have encouraged her to walk on a regular basis. I'll see her back in 3 months for follow-up visit.  2. Diabetes mellitus - further plans per her medical doctor.  3. Hyperlipidemia- -she's currently on  atorvastatin 40 mg a day.  4. Morbid obesity- I've encouraged her to exercise on a daily basis. She'll start exercising 5 or 10 minutes a day and will add 5 minutes each week.  5. R carotid stenosis - 50 % by CT angiogram Feb. 2016. - I do not hear any carotid bruit. Her carotid pulse is bounding.     Continue  aspirin 81 mg a day.   Current medicines are reviewed at length with the patient today.  The patient does not have concerns regarding medicines.  The following changes have been made:  no change   Disposition:   FU with me in 3 months.  Will get fasting labs at that time.     Signed, Laterrance Nauta J, MD  01/08/2015 2:26 PM    [facility] [address], [place], [place]  [address] Phone: [phone]; Fax: [phone]    

## 2015-02-28 ENCOUNTER — Encounter: Payer: Self-pay | Admitting: Endocrinology

## 2015-04-09 ENCOUNTER — Telehealth: Payer: Self-pay | Admitting: *Deleted

## 2015-04-09 MED ORDER — INSULIN PEN NEEDLE 32G X 4 MM MISC: Status: DC

## 2015-04-09 NOTE — Telephone Encounter (Signed)
Left msg on triage stating she is needing rx sent on her pen needles for her humalog...Kimberly Frazier

## 2015-04-10 ENCOUNTER — Encounter: Payer: Self-pay | Admitting: Cardiovascular Disease

## 2015-04-10 ENCOUNTER — Ambulatory Visit (INDEPENDENT_AMBULATORY_CARE_PROVIDER_SITE_OTHER): Payer: 59 | Admitting: Cardiovascular Disease

## 2015-04-10 VITALS — BP 168/110 | HR 94 | Ht 64.0 in | Wt 257.8 lb

## 2015-04-10 DIAGNOSIS — I10 Essential (primary) hypertension: Secondary | ICD-10-CM

## 2015-04-10 DIAGNOSIS — I25119 Atherosclerotic heart disease of native coronary artery with unspecified angina pectoris: Secondary | ICD-10-CM | POA: Diagnosis not present

## 2015-04-10 DIAGNOSIS — I2510 Atherosclerotic heart disease of native coronary artery without angina pectoris: Secondary | ICD-10-CM | POA: Insufficient documentation

## 2015-04-10 MED ORDER — CARVEDILOL 12.5 MG PO TABS: 12.5000 mg | ORAL_TABLET | Freq: Two times a day (BID) | ORAL | Status: DC

## 2015-04-10 NOTE — Patient Instructions (Signed)
Medication Instructions:  INCREASE Coreg (Carvedilol) 12.5 mg twice daily - take 12 hours apart   Labwork: Your physician recommends that you return for lab work in: 3 months on the day of or a few days before your office visit with Dr. Acie Fredrickson.  You will need to FAST for this appointment - nothing to eat or drink after midnight the night before except water.   Testing/Procedures: None Ordered   Follow-Up: Your physician recommends that you schedule a follow-up appointment in: 3 months with Dr. Acie Fredrickson   If you need a refill on your cardiac medications before your next appointment, please call your pharmacy.   Thank you for choosing [facility]! Christen Bame, RN [phone]

## 2015-04-10 NOTE — Progress Notes (Signed)
Cardiology Office Note   Date:  04/10/2015   ID:  Kimberly Frazier, DOB [birth year], MRN [id number]  PCP:  Hoyt Koch, MD  Cardiologist:   Acie Fredrickson Wonda Cheng, MD   Chief Complaint  Patient presents with  . Chest Pain   1. Essential hypertension 2. Diabetes mellitus 3. Hyperlipidemia 4. Morbid obesity 5. Palpitations 6. R carotid stenosis - 50 % by CT angiogram Feb. 2016.    History of Present Illness: Kimberly Frazier is a 54 y.o. female who presents for evaluation of some mild CP. She has DOE on occasion.   This has worsened over the past several month  Also has difficulty keeping her BP regulated.   THe CP is very atypical, worse with lying down.  Tends to go to her left side and arm.  She does comment on some chest tightness / fagitue with exertion. Does not exercise regularly. Works Scientist, research (medical).   She has tried to limit her fast foods recently.  Has also tried to use less salt.   Non smoker Some ETOH.  + Fhx - cardiac problems, mother died of MI,  Brothers died from MI  Has been gradually worsening over the past several weeks.    January 08, 2015:  Kimberly Frazier is seen back today for HTN Has occasional episodes of CP - like a brick in her chest .  She was given Coreg during her last visit but she does not have any bottle of coreg at home Also has run out of his Lisinopril for 5 days   Nov. 10, 2016 Kimberly Frazier had a myoview after her last visit  Her Kimberly Frazier is low risk but she has a small reversible defect in her lateral wall and has symptoms that are consistent with ischemia Also has family hx of CAD  Cath showed an 80% in the very distal LAD and an 80% stenosis in the distal RCA.  She was started on isosorbide. Feels better on the IMdur 30 mg a day  Has not had any angina.  Is fatigued.   Past Medical History  Diagnosis Date  . Hypertension   . Diabetes mellitus   . Arthritis   . Depression   . Hyperlipidemia     Past Surgical History  Procedure Laterality Date    . Breast biopsy  2012  . Cardiac catheterization N/A 02/06/2015    Procedure: Left Heart Cath and Coronary Angiography;  Surgeon: Larey Dresser, MD;  Location: [facility];  Service: Cardiovascular;  Laterality: N/A;     Current Outpatient Prescriptions  Medication Sig Dispense Refill  . aspirin EC 81 MG tablet Take 81 mg by mouth daily.    Marland Kitchen atorvastatin (LIPITOR) 40 MG tablet Take 1 tablet (40 mg total) by mouth daily. 31 tablet 11  . carvedilol (COREG) 6.25 MG tablet Take 1 tablet (6.25 mg total) by mouth 2 (two) times daily. 60 tablet 11  . glimepiride (AMARYL) 4 MG tablet TAKE ONE TABLET BY MOUTH ONE TIME DAILY with breakfast 30 tablet 5  . glucose blood (FREESTYLE LITE) test strip Use as instructed 100 each 12  . Insulin Lispro Prot & Lispro (HUMALOG MIX 75/25 KWIKPEN) (75-25) 100 UNIT/ML Kwikpen Inject 70 Units into the skin 2 (two) times daily. (Patient taking differently: Inject 62 Units into the skin 2 (two) times daily. ) 45 mL 11  . Insulin Pen Needle (BD PEN NEEDLE NANO U/F) 32G X 4 MM MISC Use to administer insulin twice a day Dx E11.9 100  each 3  . isosorbide mononitrate (IMDUR) 30 MG 24 hr tablet Take 30 mg by mouth daily.  6  . Liniments (SALONPAS) PADS Apply 1 each topically at bedtime as needed (foot pain).    Marland Kitchen lisinopril-hydrochlorothiazide (ZESTORETIC) 20-12.5 MG per tablet Take 2 tablets by mouth daily. 60 tablet 6  . nitroGLYCERIN (NITROSTAT) 0.4 MG SL tablet Place 1 tab under tongue for chest pain.  May repeat after 5 minutes x 2.  DO NOT TAKE MORE THAN 3 TABS DURING AN EPISODE OF CHEST PAIN 25 tablet 6  . potassium chloride SA (K-DUR,KLOR-CON) 20 MEQ tablet TAKE ONE TABLET BY MOUTH ONE TIME DAILY 30 tablet 2   No current facility-administered medications for this visit.    Allergies:   Review of patient's allergies indicates no known allergies.    Social History:  The patient  reports that she has never smoked. She has never used smokeless tobacco. She  reports that she drinks alcohol. She reports that she uses illicit drugs (Marijuana).   Family History:  The patient's family history includes Arthritis in her other; Cancer in her father and other; Diabetes in her mother and other; Heart disease in her mother, other, and sister; Hyperlipidemia in her other; Hypertension in her mother and other.    ROS:  Please see the history of present illness.    Review of Systems: Constitutional:  denies fever, chills, diaphoresis, appetite change and fatigue.  HEENT: denies photophobia, eye pain, redness, hearing loss, ear pain, congestion, sore throat, rhinorrhea, sneezing, neck pain, neck stiffness and tinnitus.  Respiratory: denies SOB, DOE, cough, chest tightness, and wheezing.  Cardiovascular: admits to chest pain, - like  Brick in her chest   Gastrointestinal: denies nausea, vomiting, abdominal pain, diarrhea, constipation, blood in stool.  Genitourinary: denies dysuria, urgency, frequency, hematuria, flank pain and difficulty urinating.  Musculoskeletal: denies  myalgias, back pain, joint swelling, arthralgias and gait problem.   Skin: denies pallor, rash and wound.  Neurological: denies dizziness, seizures, syncope, weakness, light-headedness, numbness and headaches.   Hematological: denies adenopathy, easy bruising, personal or family bleeding history.  Psychiatric/ Behavioral: denies suicidal ideation, mood changes, confusion, nervousness, sleep disturbance and agitation.       All other systems are reviewed and negative.    PHYSICAL EXAM: VS:  BP 168/110 mmHg  Pulse 94  Ht 5\' 4"  (1.626 m)  Wt 257 lb 12.8 oz (116.937 kg)  BMI 44.23 kg/m2  SpO2 97% , BMI Body mass index is 44.23 kg/(m^2). GEN: obese female  HEENT: normal Neck: no JVD, carotid bruits,  Bounding carotid pulses , no  masses Cardiac: RRR; no murmurs, rubs, or gallops,no edema  Respiratory:  clear to auscultation bilaterally, normal work of breathing GI: soft,  nontender, nondistended, + BS MS: no deformity or atrophy Skin: warm and dry, no rash Neuro:  Strength and sensation are intact Psych: normal   EKG:  EKG is ordered today. The ekg ordered today demonstrates normal sinus rhythm at 82 beats a minute. Minimal criteria for left ventricular hypertrophy.   Recent Labs: 01/24/2015: ALT 21 01/30/2015: BUN 17; Creatinine, Ser 0.99; Hemoglobin 13.6; Platelets 178.0; Potassium 3.5; Sodium 135    Lipid Panel    Component Value Date/Time   CHOL 155 10/24/2014 0918   TRIG 122.0 10/24/2014 0918   HDL 36.40* 10/24/2014 0918   CHOLHDL 4 10/24/2014 0918   VLDL 24.4 10/24/2014 0918   LDLCALC 94 10/24/2014 0918   LDLDIRECT 138.3 01/16/2013 1449  Wt Readings from Last 3 Encounters:  04/10/15 257 lb 12.8 oz (116.937 kg)  02/06/15 252 lb (114.306 kg)  01/24/15 257 lb (116.574 kg)      Other studies Reviewed: Additional studies/ records that were reviewed today include: . Review of the above records demonstrates:    ASSESSMENT AND PLAN:  1. Essential hypertension - Kimberly Frazier is seen today for follow up of her hypertension. We'll increase carvedilol to 12.5 mg twice a day.  2. Diabetes mellitus - glucose levels are very high. The last glucose level in her chart is 490.   further plans per her medical doctor.  3. Hyperlipidemia- -she's currently on  atorvastatin 40 mg a day.  4. Morbid obesity- I've encouraged her to exercise on a daily basis. She'll start exercising 5 or 10 minutes a day and will add 5 minutes each week.  5. R carotid stenosis - 50 % by CT angiogram Feb. 2016. - I do not hear any carotid bruit. Her carotid pulse is bounding.   Continue  aspirin 81 mg a day.  6. Coronary artery disease: She has tight stenosis in the distal LAD and the distal right coronary artery. So far she is doing well on medical therapy. We'll we will continue to follow.  Current medicines are reviewed at length with the patient today.  The patient does  not have concerns regarding medicines.  The following changes have been made:  no change   Disposition:   FU with me in 3 months    Signed, Tanji Storrs, Wonda Cheng, MD  04/10/2015 3:03 PM    [facility] [address], [place], [place]  [address] Phone: [phone]; Fax: [phone]

## 2015-05-04 ENCOUNTER — Other Ambulatory Visit: Payer: Self-pay | Admitting: Internal Medicine

## 2015-05-10 ENCOUNTER — Other Ambulatory Visit: Payer: Self-pay | Admitting: Internal Medicine

## 2015-05-19 ENCOUNTER — Telehealth: Payer: Self-pay | Admitting: Internal Medicine

## 2015-05-19 NOTE — Telephone Encounter (Signed)
Spoke with patient and informed her that she will need an office visit before Dr. Sharlet Salina will send anything in.

## 2015-05-19 NOTE — Telephone Encounter (Signed)
Pt states she has come down with the gout and is wondering if you can call her something in to [facility] on [facility].

## 2015-05-19 NOTE — Telephone Encounter (Signed)
Unfortunately since gout is not in her history would need visit to see if this is gout. Can advise that she try ibuprofen for the pain and inflammation.

## 2015-06-09 ENCOUNTER — Ambulatory Visit: Payer: Self-pay | Admitting: Internal Medicine

## 2015-06-30 ENCOUNTER — Other Ambulatory Visit: Payer: Self-pay | Admitting: Internal Medicine

## 2015-07-01 NOTE — Telephone Encounter (Signed)
30 day supply and OV?  A1C - Please advise.  Thanks!

## 2015-07-09 ENCOUNTER — Telehealth: Payer: Self-pay | Admitting: *Deleted

## 2015-07-09 NOTE — Telephone Encounter (Signed)
Received call pt states her insurance has change & they no longer will cover her Humalog. The alternative is Novolog needing rx sent to pharmacy for Novolog...Johny Chess

## 2015-07-10 NOTE — Telephone Encounter (Signed)
Needs visit. Is overdue for visit.

## 2015-07-10 NOTE — Telephone Encounter (Signed)
Pt advised, OV already scheduled for 02/14. Pt will discuss with PCP at that time

## 2015-07-11 ENCOUNTER — Other Ambulatory Visit: Payer: 59

## 2015-07-11 ENCOUNTER — Ambulatory Visit: Payer: 59 | Admitting: Cardiovascular Disease

## 2015-07-15 ENCOUNTER — Encounter: Payer: Self-pay | Admitting: Internal Medicine

## 2015-07-15 ENCOUNTER — Other Ambulatory Visit (INDEPENDENT_AMBULATORY_CARE_PROVIDER_SITE_OTHER): Payer: BLUE CROSS/BLUE SHIELD

## 2015-07-15 ENCOUNTER — Ambulatory Visit (INDEPENDENT_AMBULATORY_CARE_PROVIDER_SITE_OTHER): Payer: BLUE CROSS/BLUE SHIELD | Admitting: Internal Medicine

## 2015-07-15 VITALS — BP 170/100 | HR 102 | Temp 98.4°F | Resp 14 | Ht 64.0 in | Wt 263.0 lb

## 2015-07-15 DIAGNOSIS — Z794 Long term (current) use of insulin: Secondary | ICD-10-CM | POA: Diagnosis not present

## 2015-07-15 DIAGNOSIS — E1165 Type 2 diabetes mellitus with hyperglycemia: Secondary | ICD-10-CM

## 2015-07-15 DIAGNOSIS — E118 Type 2 diabetes mellitus with unspecified complications: Secondary | ICD-10-CM | POA: Diagnosis not present

## 2015-07-15 DIAGNOSIS — I10 Essential (primary) hypertension: Secondary | ICD-10-CM | POA: Diagnosis not present

## 2015-07-15 DIAGNOSIS — IMO0002 Reserved for concepts with insufficient information to code with codable children: Secondary | ICD-10-CM

## 2015-07-15 LAB — COMPREHENSIVE METABOLIC PANEL
ALK PHOS: 89 U/L (ref 39–117)
ALT: 16 U/L (ref 0–35)
AST: 14 U/L (ref 0–37)
Albumin: 3.9 g/dL (ref 3.5–5.2)
BUN: 22 mg/dL (ref 6–23)
CO2: 31 mEq/L (ref 19–32)
CREATININE: 0.94 mg/dL (ref 0.40–1.20)
Calcium: 9.6 mg/dL (ref 8.4–10.5)
Chloride: 100 mEq/L (ref 96–112)
GFR: 78.96 mL/min (ref >60.00)
GLUCOSE: 403 mg/dL — AB (ref 70–99)
POTASSIUM: 3.3 meq/L — AB (ref 3.5–5.1)
SODIUM: 139 meq/L (ref 135–145)
TOTAL PROTEIN: 7.3 g/dL (ref 6.0–8.3)
Total Bilirubin: 0.5 mg/dL (ref 0.2–1.2)

## 2015-07-15 LAB — LIPID PANEL
CHOL/HDL RATIO: 5
CHOLESTEROL: 159 mg/dL (ref 0–200)
HDL: 31.9 mg/dL — ABNORMAL LOW (ref >39.00)
NonHDL: 126.61
Triglycerides: 220 mg/dL — ABNORMAL HIGH (ref 0.0–149.0)
VLDL: 44 mg/dL — AB (ref 0.0–40.0)

## 2015-07-15 LAB — MICROALBUMIN / CREATININE URINE RATIO
Creatinine,U: 107 mg/dL
Microalb Creat Ratio: 1.4 mg/g (ref 0.0–30.0)
Microalb, Ur: 1.5 mg/dL (ref 0.0–1.9)

## 2015-07-15 LAB — HEMOGLOBIN A1C: HEMOGLOBIN A1C: 10.8 % — AB (ref 4.6–6.5)

## 2015-07-15 LAB — LDL CHOLESTEROL, DIRECT: Direct LDL: 93 mg/dL

## 2015-07-15 MED ORDER — AMLODIPINE BESYLATE 10 MG PO TABS: 10.0000 mg | ORAL_TABLET | Freq: Every day | ORAL | Status: DC

## 2015-07-15 MED ORDER — LOSARTAN POTASSIUM-HCTZ 100-25 MG PO TABS: 1.0000 | ORAL_TABLET | Freq: Every day | ORAL | Status: DC

## 2015-07-15 MED ORDER — INSULIN ASPART PROT & ASPART (70-30 MIX) 100 UNIT/ML PEN: 80.0000 [IU] | PEN_INJECTOR | Freq: Two times a day (BID) | SUBCUTANEOUS | Status: DC

## 2015-07-15 MED ORDER — METFORMIN HCL 500 MG PO TABS: 500.0000 mg | ORAL_TABLET | Freq: Two times a day (BID) | ORAL | Status: DC

## 2015-07-15 NOTE — Progress Notes (Signed)
Pre visit review using our clinic review tool, if applicable. No additional management support is needed unless otherwise documented below in the visit note. 

## 2015-07-15 NOTE — Patient Instructions (Addendum)
We have sent in the novolog pens for the sugars. We would like you to start with 80 units twice a day, give it 2-3 days and if your morning sugar is still >250 increase by 5 units both morning and night. Give it 3-4 days and if morning sugars are still >250 increase by 5 units. Continue doing this until the morning sugars are consistently around 200. Call us to let us know if you have any problems or questions. You need to bring the sugar log to that visit so we can make changes to your medicines.   The other thing that might be helpful would be if you tried the metformin again to help the insulin work better. We have sent it in to the pharmacy if you want to try that again.   We have sent in losartan to replace the lisinopril. Take 1 pill daily of this for the blood pressure. We have also send in another medicine called amlodipine for the blood pressure that you take 1 pill daily.   The blood pressure is very important and is likely causing the headaches. It puts you at higher risk for heart attack or stroke and so we want to see you back in 1 month.   Going too long between visits while we are trying to get your health right keeps you from getting to goal and puts you at risk for heart attack and stroke.  Diabetes and Exercise Exercising regularly is important. It is not just about losing weight. It has many health benefits, such as:  Improving your overall fitness, flexibility, and endurance.  Increasing your bone density.  Helping with weight control.  Decreasing your body fat.  Increasing your muscle strength.  Reducing stress and tension.  Improving your overall health. People with diabetes who exercise gain additional benefits because exercise:  Reduces appetite.  Improves the body's use of blood sugar (glucose).  Helps lower or control blood glucose.  Decreases blood pressure.  Helps control blood lipids (such as cholesterol and triglycerides).  Improves the body's use  of the hormone insulin by:  Increasing the body's insulin sensitivity.  Reducing the body's insulin needs.  Decreases the risk for heart disease because exercising:  Lowers cholesterol and triglycerides levels.  Increases the levels of good cholesterol (such as high-density lipoproteins [HDL]) in the body.  Lowers blood glucose levels. YOUR ACTIVITY PLAN  Choose an activity that you enjoy, and set realistic goals. To exercise safely, you should begin practicing any new physical activity slowly, and gradually increase the intensity of the exercise over time. Your health care provider or diabetes educator can help create an activity plan that works for you. General recommendations include:  Encouraging children to engage in at least 60 minutes of physical activity each day.  Stretching and performing strength training exercises, such as yoga or weight lifting, at least 2 times per week.  Performing a total of at least 150 minutes of moderate-intensity exercise each week, such as brisk walking or water aerobics.  Exercising at least 3 days per week, making sure you allow no more than 2 consecutive days to pass without exercising.  Avoiding long periods of inactivity (90 minutes or more). When you have to spend an extended period of time sitting down, take frequent breaks to walk or stretch. RECOMMENDATIONS FOR EXERCISING WITH TYPE 1 OR TYPE 2 DIABETES   Check your blood glucose before exercising. If blood glucose levels are greater than 240 mg/dL, check for urine ketones. Do  not exercise if ketones are present.  Avoid injecting insulin into areas of the body that are going to be exercised. For example, avoid injecting insulin into:  The arms when playing tennis.  The legs when jogging.  Keep a record of:  Food intake before and after you exercise.  Expected peak times of insulin action.  Blood glucose levels before and after you exercise.  The type and amount of exercise you  have done.  Review your records with your health care provider. Your health care provider will help you to develop guidelines for adjusting food intake and insulin amounts before and after exercising.  If you take insulin or oral hypoglycemic agents, watch for signs and symptoms of hypoglycemia. They include:  Dizziness.  Shaking.  Sweating.  Chills.  Confusion.  Drink plenty of water while you exercise to prevent dehydration or heat stroke. Body water is lost during exercise and must be replaced.  Talk to your health care provider before starting an exercise program to make sure it is safe for you. Remember, almost any type of activity is better than none.   This information is not intended to replace advice given to you by your health care provider. Make sure you discuss any questions you have with your health care provider.   Document Released: 08/07/2003 Document Revised: 10/01/2014 Document Reviewed: 10/24/2012 Elsevier Interactive Patient Education [employer].

## 2015-07-17 ENCOUNTER — Encounter: Payer: Self-pay | Admitting: Internal Medicine

## 2015-07-17 NOTE — Progress Notes (Signed)
   Subjective:    Patient ID: Kimberly Frazier, female    DOB: [birth year], 55 y.o.   MRN: [id number]  HPI The patient is a 55 YO female with several severe medical problems. We were adjusting her sugar medications but she did not return as advised. She has not been able to afford the humalog mix so has been out of insulin for several days. She had been having high sugars even while on her regimen on 300-400 most of the time. She did not call us to let us know. She has to switch back to novolog 70/30 with her insurance for cost. She is also taking amaryl and denies low sugars. Her blood pressure is also running high for some time. She is having headaches. Denies chest pains. Denies SOB or abdominal pain. No nausea, diarrhea, constipation.   Review of Systems  Constitutional: Negative for fever, activity change, appetite change, fatigue and unexpected weight change.  Respiratory: Negative for cough, chest tightness, shortness of breath and wheezing.   Cardiovascular: Negative for chest pain, palpitations and leg swelling.  Gastrointestinal: Negative for nausea, abdominal pain, diarrhea, constipation and abdominal distention.  Endocrine: Positive for polydipsia and polyuria.  Musculoskeletal: Positive for arthralgias. Negative for back pain, gait problem and neck pain.  Neurological: Positive for headaches. Negative for dizziness, syncope, facial asymmetry, weakness and numbness.  Psychiatric/Behavioral: Negative.       Objective:   Physical Exam  Constitutional: She is oriented to person, place, and time. She appears well-developed and well-nourished.  Overweight  HENT:  Head: Normocephalic and atraumatic.  Eyes: EOM are normal.  Neck: Normal range of motion.  Cardiovascular: Normal rate and regular rhythm.   Pulmonary/Chest: Effort normal and breath sounds normal. No respiratory distress. She has no wheezes. She has no rales.  Abdominal: Soft. Bowel sounds are normal. She exhibits no  distension. There is no tenderness. There is no rebound.  Musculoskeletal: She exhibits no edema.  Neurological: She is alert and oriented to person, place, and time. Coordination normal.  Skin: Skin is warm and dry.   Filed Vitals:   07/15/15 1433  BP: 170/100  Pulse: 102  Temp: 98.4 F (36.9 C)  TempSrc: Oral  Resp: 14  Height: 5\' 4"  (1.626 m)  Weight: 263 lb (119.296 kg)  SpO2: 96%      Assessment & Plan:

## 2015-07-17 NOTE — Assessment & Plan Note (Addendum)
It is difficult to adjust since she has not been checking sugars well. Increase novolog 70/30 to 80 units BID and after 3 days if sugars still >250 in the morning go up to 5 units morning and night and then again in 3 days then call if she is at 90 units BID and still high sugars. Call if any low sugars. Reminded her how important good control of her diabetes is to preventing long term consequences. Have also asked her to resume metformin 500 mg BID since she cannot recall bad side effects with it.

## 2015-07-17 NOTE — Assessment & Plan Note (Signed)
We have increased her medication to losartan/hctz 100/25 mg daily and continue coreg 12.5 mg BID, and imdur 30 mg daily and amlodipine 10 mg daily. Checking labs for complications.

## 2015-07-17 NOTE — Assessment & Plan Note (Signed)
Weight is up from last visit and talked to her again about exercise and diet. She is not having good diet and not exercising at this time.

## 2015-07-23 ENCOUNTER — Encounter: Payer: Self-pay | Admitting: Cardiovascular Disease

## 2015-08-12 ENCOUNTER — Ambulatory Visit: Payer: BLUE CROSS/BLUE SHIELD | Admitting: Internal Medicine

## 2015-09-16 ENCOUNTER — Encounter: Payer: Self-pay | Admitting: Dietician

## 2015-09-16 ENCOUNTER — Encounter: Payer: BLUE CROSS/BLUE SHIELD | Attending: Internal Medicine | Admitting: Dietician

## 2015-09-16 VITALS — Ht 64.0 in | Wt 253.5 lb

## 2015-09-16 DIAGNOSIS — Z794 Long term (current) use of insulin: Secondary | ICD-10-CM | POA: Diagnosis present

## 2015-09-16 DIAGNOSIS — E1169 Type 2 diabetes mellitus with other specified complication: Secondary | ICD-10-CM | POA: Insufficient documentation

## 2015-09-16 DIAGNOSIS — E119 Type 2 diabetes mellitus without complications: Secondary | ICD-10-CM

## 2015-09-16 NOTE — Patient Instructions (Addendum)
-  When you feel funny, test your blood sugar!  -Think about switching to Diet Coke (try to avoid any liquid with sugar in it)  -Stick to water and water with sugar free flavoring   -When looking at a food label, focus on:  -Serving size  -Total Carbohydrates (when possible choose high fiber, low sugar carbs)  -Pre portion snacks  -Pair a protein food with each meal and snack  Examples: -Breakfast: waffle + egg -Snack: 1 piece fruit + cottage cheese -Lunch: Bologna sandwich and diet coke -Dinner: meat + vegetable + carb -Evening snack: carb + protein

## 2015-09-17 ENCOUNTER — Encounter: Payer: Self-pay | Admitting: Dietician

## 2015-09-17 NOTE — Progress Notes (Signed)
Diabetes Self-Management Education  Visit Type: First/Initial  Appt. Start Time: 200 Appt. End Time: 300  09/17/2015  [name], identified by name and date of birth, is a 55 y.o. female with a diagnosis of Diabetes: Type 2. She lives with her daughter and 2 teenaged grandchildren. She and her daughter share the grocery shopping and cooking duties. Kimberly Frazier works at [employer]. She reports that she has had Type 2 Diabetes for about 10 years and has never received formal diabetes education.  ASSESSMENT  Height 5\' 4"  (1.626 m), weight 253 lb 8 oz (114.987 kg). Body mass index is 43.49 kg/(m^2).      Diabetes Self-Management Education - 09/17/15 0916    Patient Education   Disease state  Factors that contribute to the development of diabetes   Nutrition management  Role of diet in the treatment of diabetes and the relationship between the three main macronutrients and blood glucose level;Food label reading, portion sizes and measuring food.;Carbohydrate counting   Personal strategies to promote health Lifestyle issues that need to be addressed for better diabetes care   Individualized Goals (developed by patient)   Nutrition Follow meal plan discussed;General guidelines for healthy choices and portions discussed   Monitoring  test my blood glucose as discussed   Outcomes   Expected Outcomes Demonstrated interest in learning. Expect positive outcomes   Future DMSE 3-4 months   Program Status Completed      Individualized Plan for Diabetes Self-Management Training:   Learning Objective:  Patient will have a greater understanding of diabetes self-management. Patient education plan is to attend individual and/or group sessions per assessed needs and concerns.   Plan:   Patient Instructions  -When you feel funny, test your blood sugar! -Think about switching to Diet Coke (try to avoid any liquid with sugar in it)  -Stick to water and water with sugar free flavoring  -When looking at  a food label, focus on:  -Serving size  -Total Carbohydrates (when possible choose high fiber, low sugar carbs) -Pre portion snacks -Pair a protein food with each meal and snack  Examples: -Breakfast: waffle + egg -Snack: 1 piece fruit + cottage cheese -Lunch: Bologna sandwich and diet coke -Dinner: meat + vegetable + carb -Evening snack: carb + protein    Expected Outcomes:  Demonstrated interest in learning. Expect positive outcomes  Education material provided: Living Well with Diabetes, Meal plan card, My Plate and Snack sheet  If problems or questions, patient to contact team via:  Phone and Email  Future DSME appointment: 3-4 months

## 2015-10-09 ENCOUNTER — Other Ambulatory Visit: Payer: Self-pay | Admitting: Internal Medicine

## 2015-10-10 ENCOUNTER — Other Ambulatory Visit: Payer: Self-pay

## 2015-10-10 MED ORDER — ATORVASTATIN CALCIUM 40 MG PO TABS: 40.0000 mg | ORAL_TABLET | Freq: Every day | ORAL | Status: DC

## 2015-10-13 ENCOUNTER — Other Ambulatory Visit: Payer: Self-pay | Admitting: *Deleted

## 2015-10-13 MED ORDER — GLIMEPIRIDE 4 MG PO TABS: ORAL_TABLET | ORAL | Status: DC

## 2015-11-20 ENCOUNTER — Other Ambulatory Visit: Payer: Self-pay | Admitting: *Deleted

## 2015-11-20 ENCOUNTER — Other Ambulatory Visit: Payer: Self-pay | Admitting: Internal Medicine

## 2015-11-20 MED ORDER — CARVEDILOL 12.5 MG PO TABS: 12.5000 mg | ORAL_TABLET | Freq: Two times a day (BID) | ORAL | Status: DC

## 2015-12-16 ENCOUNTER — Ambulatory Visit: Payer: BLUE CROSS/BLUE SHIELD | Admitting: Dietician

## 2016-01-19 ENCOUNTER — Other Ambulatory Visit (INDEPENDENT_AMBULATORY_CARE_PROVIDER_SITE_OTHER): Payer: BLUE CROSS/BLUE SHIELD

## 2016-01-19 ENCOUNTER — Ambulatory Visit (INDEPENDENT_AMBULATORY_CARE_PROVIDER_SITE_OTHER): Payer: BLUE CROSS/BLUE SHIELD | Admitting: Internal Medicine

## 2016-01-19 ENCOUNTER — Encounter: Payer: Self-pay | Admitting: Internal Medicine

## 2016-01-19 VITALS — BP 142/88 | HR 97 | Temp 98.4°F | Resp 14 | Ht 64.0 in | Wt 252.0 lb

## 2016-01-19 DIAGNOSIS — R51 Headache: Secondary | ICD-10-CM

## 2016-01-19 DIAGNOSIS — Z794 Long term (current) use of insulin: Secondary | ICD-10-CM

## 2016-01-19 DIAGNOSIS — E1165 Type 2 diabetes mellitus with hyperglycemia: Secondary | ICD-10-CM | POA: Diagnosis not present

## 2016-01-19 DIAGNOSIS — R519 Headache, unspecified: Secondary | ICD-10-CM

## 2016-01-19 DIAGNOSIS — E118 Type 2 diabetes mellitus with unspecified complications: Secondary | ICD-10-CM | POA: Diagnosis not present

## 2016-01-19 DIAGNOSIS — IMO0002 Reserved for concepts with insufficient information to code with codable children: Secondary | ICD-10-CM

## 2016-01-19 DIAGNOSIS — I10 Essential (primary) hypertension: Secondary | ICD-10-CM | POA: Diagnosis not present

## 2016-01-19 LAB — COMPREHENSIVE METABOLIC PANEL
ALT: 16 U/L (ref 0–35)
AST: 13 U/L (ref 0–37)
Albumin: 3.8 g/dL (ref 3.5–5.2)
Alkaline Phosphatase: 106 U/L (ref 39–117)
BUN: 14 mg/dL (ref 6–23)
CHLORIDE: 99 meq/L (ref 96–112)
CO2: 28 mEq/L (ref 19–32)
Calcium: 9.2 mg/dL (ref 8.4–10.5)
Creatinine, Ser: 1.13 mg/dL (ref 0.40–1.20)
GFR: 63.73 mL/min (ref >60.00)
GLUCOSE: 371 mg/dL — AB (ref 70–99)
POTASSIUM: 3.7 meq/L (ref 3.5–5.1)
SODIUM: 135 meq/L (ref 135–145)
Total Bilirubin: 0.8 mg/dL (ref 0.2–1.2)
Total Protein: 7.3 g/dL (ref 6.0–8.3)

## 2016-01-19 LAB — CBC
HEMATOCRIT: 40 % (ref 36.0–46.0)
Hemoglobin: 13.6 g/dL (ref 12.0–15.0)
MCHC: 34.1 g/dL (ref 30.0–36.0)
MCV: 87 fl (ref 78.0–100.0)
PLATELETS: 197 10*3/uL (ref 150.0–400.0)
RBC: 4.6 Mil/uL (ref 3.87–5.11)
RDW: 13.4 % (ref 11.5–15.5)
WBC: 11.3 10*3/uL — AB (ref 4.0–10.5)

## 2016-01-19 LAB — HEMOGLOBIN A1C: Hgb A1c MFr Bld: 13 % — ABNORMAL HIGH (ref 4.6–6.5)

## 2016-01-19 LAB — SEDIMENTATION RATE: SED RATE: 40 mm/h — AB (ref 0–30)

## 2016-01-19 MED ORDER — GABAPENTIN 300 MG PO CAPS: 300.0000 mg | ORAL_CAPSULE | Freq: Three times a day (TID) | ORAL | 3 refills | Status: DC

## 2016-01-19 NOTE — Progress Notes (Signed)
Pre visit review using our clinic review tool, if applicable. No additional management support is needed unless otherwise documented below in the visit note. 

## 2016-01-19 NOTE — Assessment & Plan Note (Signed)
She is not taking her medications as prescribed and has not returned as asked for recheck. BP is close to goal today and she is asked to continue with amlodipine, losartan/hctz and coreg. She is asked to return to her cardiologist who prescribes the imdur to see if it is still needed. She is not having chest pains now so will not ask her to resume today. Checking CMP and adjust as needed.

## 2016-01-19 NOTE — Assessment & Plan Note (Signed)
Checking CT head and ESR, CMP, CBC. She has now had the symptoms for 1 month and visual changes for 2 weeks. She is encouraged to seek care more quickly in the future. Differential includes recurrent bell's palsy, temporal arteritis, and stroke. She is outside the window of treatment for stroke. She is asked to see an eye doctor ASAP for the visual changes. Rx for gabapentin for the pain.

## 2016-01-19 NOTE — Progress Notes (Signed)
   Subjective:    Patient ID: Kimberly Frazier, female    DOB: [birth year], 57 y.o.   MRN: [id number]  HPI The patient is a 55 YO female coming in for headaches. They are all day and most days. She is also having pain on the left side of her face for 1 month. She is also having pain in her left neck. She is having some vision changes on the left side for about 2 weeks. She has not sought care before now and just is worried about how long it is lasting. She is not taking several of her medicines for many months now due to being out (also metformin she stopped due to diarrhea). She has tried advil and tylenol which did not help at all. Rates is 6-7/10. 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 No fevers or chills. No weight change or chest pains. No SOB. Previously she was having some headaches daily but they did not last like this and advil was effective.Denies any injury to the area before this started or increased activity. She does not exercise. Taking 62 units BID insulin because she cannot figure out how to get it to give her more. Her sugars are routinely >200 and her blood pressures are unknown at home but she often runs high. Other than her imdur she is trying to take all her blood pressure medicines.    Review of Systems  Constitutional: Negative for activity change, appetite change, fatigue, fever and unexpected weight change.  HENT: Negative for congestion, postnasal drip, rhinorrhea, sinus pressure and sore throat.   Eyes: Positive for visual disturbance.  Respiratory: Negative for cough, chest tightness, shortness of breath and wheezing.   Cardiovascular: Negative for chest pain, palpitations and leg swelling.  Gastrointestinal: Negative for abdominal distention, abdominal pain, constipation, diarrhea and nausea.  Musculoskeletal: Positive for arthralgias, myalgias, neck pain and neck stiffness. Negative for back pain and gait problem.  Neurological:  Positive for numbness and headaches. Negative for dizziness, tremors, syncope, facial asymmetry, speech difficulty and weakness.  Psychiatric/Behavioral: Negative.       Objective:   Physical Exam  Constitutional: She is oriented to person, place, and time. She appears well-developed and well-nourished.  Overweight  HENT:  Head: Normocephalic and atraumatic.  Pain in the left neck laterally and on the face and temporal region.   Eyes: EOM are normal.  Neck: Normal range of motion.  Cardiovascular: Normal rate and regular rhythm.   Pulmonary/Chest: Effort normal and breath sounds normal. No respiratory distress. She has no wheezes. She has no rales.  Abdominal: Soft. Bowel sounds are normal. She exhibits no distension. There is no tenderness. There is no rebound.  Musculoskeletal: She exhibits no edema.  Neurological: She is alert and oriented to person, place, and time. No cranial nerve deficit. Coordination normal.  CN 2-12 intact, some difference in sensation left to right face.   Skin: Skin is warm and dry.   Vitals:   01/19/16 0807  BP: (!) 142/88  Pulse: 97  Resp: 14  Temp: 98.4 F (36.9 C)  TempSrc: Oral  SpO2: 97%  Weight: 252 lb (114.3 kg)  Height: 5\' 4"  (1.626 m)      Assessment & Plan:

## 2016-01-19 NOTE — Assessment & Plan Note (Signed)
Checking HgA1c although I suspect she will still be uncontrolled. She has stopped taking metformin without letting us know. She is taking 62 units 70/30 insulin BID which is below the 80 units BID she is supposed to be taking. No low sugars and usually >200 per her reports. Reminded her again about the serious consequences of high sugars long term.

## 2016-01-19 NOTE — Patient Instructions (Addendum)
We are checking some blood work and a head ct to see if we can find a cause for the headaches.   You need to see an eye doctor as soon as possible about the eye findings to see what Is causing those changes.   We need also to talk about the sugars and other options for the treatment as uncontrolled diabetes puts you at risk for many problems like stroke, heart attack, permanent nerve damage.   We have sent in a medicine called gabapentin for the headaches. Take 1 pill daily for 3 days, then you can increase to 1 pill twice a day for 3 days, then you can increase to 1 pill 3 times per day.

## 2016-01-20 ENCOUNTER — Other Ambulatory Visit: Payer: Self-pay | Admitting: Internal Medicine

## 2016-02-05 ENCOUNTER — Other Ambulatory Visit: Payer: BLUE CROSS/BLUE SHIELD

## 2016-02-19 ENCOUNTER — Ambulatory Visit
Admission: RE | Admit: 2016-02-19 | Discharge: 2016-02-19 | Disposition: A | Discharge status: Home / Self Care | Payer: BLUE CROSS/BLUE SHIELD | Source: Ambulatory Visit | Attending: Internal Medicine | Admitting: Internal Medicine

## 2016-02-19 DIAGNOSIS — R519 Headache, unspecified: Secondary | ICD-10-CM

## 2016-02-19 DIAGNOSIS — R51 Headache: Principal | ICD-10-CM

## 2016-04-25 ENCOUNTER — Other Ambulatory Visit: Payer: Self-pay | Admitting: Internal Medicine

## 2016-05-17 ENCOUNTER — Other Ambulatory Visit: Payer: Self-pay | Admitting: Cardiovascular Disease

## 2016-05-17 ENCOUNTER — Other Ambulatory Visit: Payer: Self-pay | Admitting: Internal Medicine

## 2016-05-26 ENCOUNTER — Other Ambulatory Visit: Payer: Self-pay | Admitting: Internal Medicine

## 2016-09-03 ENCOUNTER — Encounter: Payer: Self-pay | Admitting: Internal Medicine

## 2016-09-03 ENCOUNTER — Ambulatory Visit (INDEPENDENT_AMBULATORY_CARE_PROVIDER_SITE_OTHER): Payer: 59 | Admitting: Internal Medicine

## 2016-09-03 DIAGNOSIS — IMO0002 Reserved for concepts with insufficient information to code with codable children: Secondary | ICD-10-CM

## 2016-09-03 DIAGNOSIS — E1165 Type 2 diabetes mellitus with hyperglycemia: Secondary | ICD-10-CM

## 2016-09-03 DIAGNOSIS — I10 Essential (primary) hypertension: Secondary | ICD-10-CM | POA: Diagnosis not present

## 2016-09-03 DIAGNOSIS — E1142 Type 2 diabetes mellitus with diabetic polyneuropathy: Secondary | ICD-10-CM

## 2016-09-03 DIAGNOSIS — Z794 Long term (current) use of insulin: Secondary | ICD-10-CM | POA: Diagnosis not present

## 2016-09-03 DIAGNOSIS — E118 Type 2 diabetes mellitus with unspecified complications: Secondary | ICD-10-CM

## 2016-09-03 MED ORDER — GABAPENTIN 300 MG PO CAPS: 300.0000 mg | ORAL_CAPSULE | Freq: Three times a day (TID) | ORAL | 3 refills | Status: DC

## 2016-09-03 NOTE — Patient Instructions (Signed)
We have sent in the refill of the gabapentin today which you can try for the foot pain. Take 1 pill up to 3 times a day as needed for pain.  We will have the foot doctor call you about getting in.   Working on the sugars will also help with the leg pain.  We have sent in refills of the medicine for blood pressure.   You should be taking for blood pressure:  Losartan/hctz 1 pill daily  Amlodipine 1 pill daily  Carvedilol 1 pill twice a day

## 2016-09-03 NOTE — Assessment & Plan Note (Addendum)
Rx for gabapentin 300 mg to help with the discomfort. We talked about the fact that getting her sugars under control will help with the pain as well. She does not want to go see diabetes specialist today and cannot take the metformin due to diarrhea. She does not want to add more injections. She does not have sugar log and we cannot adjust insulin today. She also declines checking HgA1c today. Referral to podiatry.

## 2016-09-03 NOTE — Assessment & Plan Note (Signed)
We talked about the fact that getting her sugars under control will help with the pain as well. She does not want to go see diabetes specialist today and cannot take the metformin due to diarrhea. She does not want to add more injections. She does not have sugar log and we cannot adjust insulin today. She also declines checking HgA1c today. Last Hga1c 13 and with her out of medications for several months not likely to be improved today.

## 2016-09-03 NOTE — Progress Notes (Signed)
Pre visit review using our clinic review tool, if applicable. No additional management support is needed unless otherwise documented below in the visit note. 

## 2016-09-03 NOTE — Assessment & Plan Note (Signed)
Not taking all her medications after losing insurance. She has not followed up with cardiology as recommended last visit. She is supposed to be taking losartan/hctz, amlodipine, coreg and given this information at her visit. Her blood pressure is uncontrolled for some time and she is complicated by CKD and hypertensive heart disease.

## 2016-09-03 NOTE — Progress Notes (Signed)
   Subjective:    Patient ID: Kimberly Frazier, female    DOB: Apr 11, 1961, 56 y.o.   MRN: [id number]  HPI The patient is a 56 YO female coming in for pain in her legs and feet. She is having high sugars at home and does not monitor sugars often. She does take humalog but never has low sugars. She was off insurance for about 3-4 months and did not have any medications. The pain feels like tightness and sometimes numbness in her legs. She denies fevers or chills. No rash on her legs. No cuts or sores on her feet. No injury to the area. Mostly from the knees downwards.   She is also having high blood pressures at home. Is not sure she is taking all her medications. Taking losartan medication and carvedilol but not sure about others. She is having some headaches off and on. Denies chest pains or SOB.   Review of Systems  Constitutional: Positive for activity change and fatigue. Negative for appetite change, chills, fever and unexpected weight change.  HENT: Negative.   Eyes: Negative.   Respiratory: Negative for cough, chest tightness and shortness of breath.   Cardiovascular: Negative for chest pain, palpitations and leg swelling.  Gastrointestinal: Positive for diarrhea. Negative for abdominal distention, abdominal pain, constipation, nausea and vomiting.  Musculoskeletal: Positive for arthralgias, gait problem and myalgias. Negative for joint swelling.  Skin: Negative.   Neurological: Positive for numbness and headaches. Negative for dizziness, seizures, speech difficulty and weakness.  Psychiatric/Behavioral: Negative.       Objective:   Physical Exam  Constitutional: She is oriented to person, place, and time. She appears well-developed and well-nourished.  Overweight  HENT:  Head: Normocephalic and atraumatic.  Eyes: EOM are normal.  Neck: Normal range of motion.  Cardiovascular: Normal rate and regular rhythm.   Pulmonary/Chest: Effort normal and breath sounds normal. No respiratory  distress. She has no wheezes. She has no rales.  Abdominal: Soft. Bowel sounds are normal. She exhibits no distension. There is no tenderness. There is no rebound.  Musculoskeletal: She exhibits no edema.  Neurological: She is alert and oriented to person, place, and time. Coordination normal.  Peripheral neuropathy in the feet to mid shins, no callusing or skin breakdown.   Skin: Skin is warm and dry.   Vitals:   09/03/16 1606 09/03/16 1631  BP: (!) 160/100 (!) 158/86  Pulse: 92   Resp: 14   Temp: 98.2 F (36.8 C)   TempSrc: Oral   SpO2: 98%   Weight: 256 lb (116.1 kg)   Height: 5\' 4"  (1.626 m)       Assessment & Plan:

## 2016-10-07 ENCOUNTER — Other Ambulatory Visit: Payer: Self-pay | Admitting: Internal Medicine

## 2016-10-12 ENCOUNTER — Ambulatory Visit: Payer: Self-pay | Admitting: Sports Medicine

## 2016-11-02 ENCOUNTER — Ambulatory Visit (INDEPENDENT_AMBULATORY_CARE_PROVIDER_SITE_OTHER): Payer: 59 | Admitting: Sports Medicine

## 2016-11-02 ENCOUNTER — Encounter: Payer: Self-pay | Admitting: Sports Medicine

## 2016-11-02 ENCOUNTER — Ambulatory Visit (INDEPENDENT_AMBULATORY_CARE_PROVIDER_SITE_OTHER): Payer: 59

## 2016-11-02 DIAGNOSIS — M216X1 Other acquired deformities of right foot: Secondary | ICD-10-CM | POA: Diagnosis not present

## 2016-11-02 DIAGNOSIS — R52 Pain, unspecified: Secondary | ICD-10-CM

## 2016-11-02 DIAGNOSIS — M779 Enthesopathy, unspecified: Secondary | ICD-10-CM

## 2016-11-02 DIAGNOSIS — M19079 Primary osteoarthritis, unspecified ankle and foot: Secondary | ICD-10-CM | POA: Diagnosis not present

## 2016-11-02 DIAGNOSIS — M216X2 Other acquired deformities of left foot: Secondary | ICD-10-CM

## 2016-11-02 MED ORDER — MELOXICAM 15 MG PO TABS: 15.0000 mg | ORAL_TABLET | Freq: Every day | ORAL | 0 refills | Status: DC

## 2016-11-02 NOTE — Progress Notes (Signed)
Subjective: Kimberly Frazier is a 56 y.o. female patient who presents to office for evaluation of bilateral foot pain. Patient complains of progressive pain from the knee down to ankles. Patient has tried change in shoes with no relief in symptoms and in the past had knee injection on right. Reports that she is diabetic pain is different from neuropathy hurts with use. Patient denies any other pedal complaints. Denies injury/trip/fall/sprain/any causative factors.   Patient Active Problem List   Diagnosis Date Noted  . Diabetic peripheral neuropathy ([place]) 09/03/2016  . Headache 01/19/2016  . CAD (coronary artery disease) 04/10/2015  . Neck pain on right side 06/20/2014  . Severe obesity (BMI >= 40) ([place]) 09/20/2013  . 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 Hyperlipidemia 10/25/2012  . Diabetes mellitus type 2, uncontrolled, with complications ([place]) 56/38/7564  . Uncontrolled hypertension 07/26/2012    Current Outpatient Prescriptions on File Prior to Visit  Medication Sig Dispense Refill  . amLODipine (NORVASC) 10 MG tablet Take 1 tablet (10 mg total) by mouth daily. 90 tablet 3  . aspirin EC 81 MG tablet Take 81 mg by mouth daily.    Marland Kitchen atorvastatin (LIPITOR) 40 MG tablet Take 1 tablet (40 mg total) by mouth daily. 31 tablet 6  . BD PEN NEEDLE NANO U/F 32G X 4 MM MISC USE TWICE DAILY AS DIRECTED 100 each 0  . carvedilol (COREG) 12.5 MG tablet Take 1 tablet (12.5 mg total) by mouth 2 (two) times daily. 180 tablet 1  . carvedilol (COREG) 12.5 MG tablet Take 1 tablet (12.5 mg total) by mouth 2 (two) times daily. *Patient is overdue for an appt. Please call and schedule for further refills* 60 tablet 0  . gabapentin (NEURONTIN) 300 MG capsule Take 1 capsule (300 mg total) by mouth 3 (three) times daily. 90 capsule 3  . glimepiride (AMARYL) 4 MG tablet TAKE 1 TABLET BY MOUTH EVERY DAY WITH BREAKFAST 90 tablet 0  . glucose blood (FREESTYLE LITE) test strip Use as instructed 100 each 12  . HUMALOG MIX  75/25 KWIKPEN (75-25) 100 UNIT/ML Kwikpen INJECT 70 UNITS INTO SKIN TWICE DAILY 45 mL 0  . insulin aspart protamine - aspart (NOVOLOG MIX 70/30 FLEXPEN) (70-30) 100 UNIT/ML FlexPen Inject 0.8-0.9 mLs (80-90 Units total) into the skin 2 (two) times daily. 50 mL 6  . isosorbide mononitrate (IMDUR) 30 MG 24 hr tablet Take 30 mg by mouth daily.  6  . Liniments (SALONPAS) PADS Apply 1 each topically at bedtime as needed (foot pain). Reported on 07/15/2015    . losartan-hydrochlorothiazide (HYZAAR) 100-25 MG tablet Take 1 tablet by mouth daily. 90 tablet 3  . metFORMIN (GLUCOPHAGE) 500 MG tablet Take 1 tablet (500 mg total) by mouth 2 (two) times daily with a meal. 180 tablet 3  . nitroGLYCERIN (NITROSTAT) 0.4 MG SL tablet Place 1 tab under tongue for chest pain.  May repeat after 5 minutes x 2.  DO NOT TAKE MORE THAN 3 TABS DURING AN EPISODE OF CHEST PAIN 25 tablet 6  . Potassium Chloride ER 20 MEQ TBCR TAKE 1 TABLET BY MOUTH EVERY DAY 90 tablet 1  . potassium chloride SA (K-DUR,KLOR-CON) 20 MEQ tablet TAKE ONE TABLET BY MOUTH ONE TIME DAILY 30 tablet 2   No current facility-administered medications on file prior to visit.     No Known Allergies  Objective:  General: Alert and oriented x3 in no acute distress  Dermatology: No open lesions bilateral lower extremities, no webspace macerations, no ecchymosis bilateral, all  nails x 10 are well manicured.  Vascular: Dorsalis Pedis and Posterior Tibial pedal pulses palpable, Capillary Fill Time 3 seconds,(+) pedal hair growth bilateral, no edema bilateral lower extremities, Temperature gradient within normal limits.  Neurology: Johney Maine sensation intact via light touch bilateral, Protective sensation intact  with Thornell Mule Monofilament to all pedal sites, Position sense intact, vibratory intact bilateral, Deep tendon reflexes within normal limits bilateral, No babinski sign present bilateral. (- )Tinels sign bilateral.   Musculoskeletal: Mild  tenderness with palpation at anterior shins and diffuse over dorsum of foot and bunions bilateral. No pain with calf compression bilateral. There is decreased ankle rom with knee extending  vs flexed resembling gastroc equnius bilateral. Strength within normal limits in all groups bilateral.   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  Assessment and Plan: Problem List Items Addressed This Visit    None    Visit Diagnoses    Tendonitis    -  Primary   Relevant Medications   meloxicam (MOBIC) 15 MG tablet   Pain       Relevant Medications   meloxicam (MOBIC) 15 MG tablet   Other Relevant Orders   DG Foot Complete Right   DG Foot Complete Left   Acquired equinus deformity of both feet       Arthritis of foot       Relevant Medications   meloxicam (MOBIC) 15 MG tablet       -Complete examination performed -Xrays reviewed -Discussed treatement options for tendonitis secondary to equinus and arthritis -Rx heel lifts and mobic -Recommend daily stretching, icing, soaks, and good supportive shoes -Patient to return to office 4-5 weeks for follow up or sooner if condition worsens.  Landis Martins, DPM

## 2016-11-02 NOTE — Progress Notes (Signed)
   Subjective:    Patient ID: Kimberly Frazier, female    DOB: Feb 26, 1961, 56 y.o.   MRN: [id number]  HPI    Review of Systems  HENT: Positive for hearing loss.   Eyes: Positive for pain and visual disturbance.  Cardiovascular:       Calf pain with walking   Gastrointestinal: Positive for abdominal pain, constipation and diarrhea.  Endocrine: Positive for heat intolerance.       Excessive thirst and increase urination  Musculoskeletal: Positive for gait problem.  Neurological: Positive for dizziness, weakness, light-headedness and headaches.  All other systems reviewed and are negative.      Objective:   Physical Exam        Assessment & Plan:

## 2016-11-05 ENCOUNTER — Other Ambulatory Visit: Payer: Self-pay | Admitting: Cardiovascular Disease

## 2016-11-05 NOTE — Telephone Encounter (Signed)
atorvastatin (LIPITOR) 40 MG tablet  Medication  Date: 10/10/2015 Department: [address] Office Ordering/Authorizing: Nahser, Wonda Cheng, MD  Order Providers   Prescribing Provider Encounter Provider  Nahser, Wonda Cheng, MD Brett Canales, LPN  Medication Detail    Disp Refills Start End   atorvastatin (LIPITOR) 40 MG tablet 31 tablet 6 10/10/2015    Sig - Route: Take 1 tablet (40 mg total) by mouth daily. - Oral   E-Prescribing Status: Receipt confirmed by pharmacy (10/10/2015 2:40 PM EDT)   Pharmacy   [id number] - [place], [address] AT [place]   Will send to Christen Bame for clarification on how to proceed as the pt needs an appt since she hasnt been seen since 04/10/15 and we havent filled this medication since 10/10/15, so since Nov 2017 we have not sent in any refills, please advise, thanks

## 2016-12-14 ENCOUNTER — Ambulatory Visit: Payer: 59 | Admitting: Sports Medicine

## 2016-12-15 ENCOUNTER — Other Ambulatory Visit: Payer: Self-pay | Admitting: Internal Medicine

## 2016-12-20 ENCOUNTER — Other Ambulatory Visit: Payer: Self-pay | Admitting: Cardiovascular Disease

## 2017-01-18 ENCOUNTER — Other Ambulatory Visit: Payer: Self-pay | Admitting: Sports Medicine

## 2017-01-18 DIAGNOSIS — M779 Enthesopathy, unspecified: Secondary | ICD-10-CM

## 2017-01-18 DIAGNOSIS — R52 Pain, unspecified: Secondary | ICD-10-CM

## 2017-02-03 ENCOUNTER — Other Ambulatory Visit: Payer: Self-pay | Admitting: Sports Medicine

## 2017-02-03 DIAGNOSIS — R52 Pain, unspecified: Secondary | ICD-10-CM

## 2017-02-03 DIAGNOSIS — M779 Enthesopathy, unspecified: Secondary | ICD-10-CM

## 2017-02-04 NOTE — Telephone Encounter (Signed)
Pt was instructed to make an appt in 4-5 weeks after last office visit.

## 2017-02-08 ENCOUNTER — Ambulatory Visit: Payer: 59 | Admitting: Sports Medicine

## 2017-03-02 ENCOUNTER — Other Ambulatory Visit: Payer: Self-pay | Admitting: Internal Medicine

## 2017-04-13 ENCOUNTER — Other Ambulatory Visit: Payer: Self-pay | Admitting: Internal Medicine

## 2017-04-13 ENCOUNTER — Other Ambulatory Visit: Payer: Self-pay | Admitting: *Deleted

## 2017-04-14 MED ORDER — POTASSIUM CHLORIDE ER 20 MEQ PO TBCR: 1.0000 | EXTENDED_RELEASE_TABLET | Freq: Every day | ORAL | 0 refills | Status: DC

## 2017-04-18 ENCOUNTER — Ambulatory Visit: Payer: Self-pay | Attending: Internal Medicine | Admitting: Internal Medicine

## 2017-04-18 ENCOUNTER — Encounter: Payer: Self-pay | Admitting: Internal Medicine

## 2017-04-18 VITALS — BP 168/107 | HR 111 | Temp 99.4°F | Resp 18 | Ht 66.0 in | Wt 260.0 lb

## 2017-04-18 DIAGNOSIS — Z79899 Other long term (current) drug therapy: Secondary | ICD-10-CM | POA: Insufficient documentation

## 2017-04-18 DIAGNOSIS — I119 Hypertensive heart disease without heart failure: Secondary | ICD-10-CM | POA: Insufficient documentation

## 2017-04-18 DIAGNOSIS — Z9889 Other specified postprocedural states: Secondary | ICD-10-CM | POA: Insufficient documentation

## 2017-04-18 DIAGNOSIS — Z8249 Family history of ischemic heart disease and other diseases of the circulatory system: Secondary | ICD-10-CM | POA: Insufficient documentation

## 2017-04-18 DIAGNOSIS — Z2821 Immunization not carried out because of patient refusal: Secondary | ICD-10-CM | POA: Insufficient documentation

## 2017-04-18 DIAGNOSIS — Z114 Encounter for screening for human immunodeficiency virus [HIV]: Secondary | ICD-10-CM | POA: Insufficient documentation

## 2017-04-18 DIAGNOSIS — Z794 Long term (current) use of insulin: Secondary | ICD-10-CM | POA: Insufficient documentation

## 2017-04-18 DIAGNOSIS — Z1231 Encounter for screening mammogram for malignant neoplasm of breast: Secondary | ICD-10-CM

## 2017-04-18 DIAGNOSIS — Z809 Family history of malignant neoplasm, unspecified: Secondary | ICD-10-CM | POA: Insufficient documentation

## 2017-04-18 DIAGNOSIS — Z833 Family history of diabetes mellitus: Secondary | ICD-10-CM | POA: Insufficient documentation

## 2017-04-18 DIAGNOSIS — E118 Type 2 diabetes mellitus with unspecified complications: Secondary | ICD-10-CM

## 2017-04-18 DIAGNOSIS — Z955 Presence of coronary angioplasty implant and graft: Secondary | ICD-10-CM | POA: Insufficient documentation

## 2017-04-18 DIAGNOSIS — Z7982 Long term (current) use of aspirin: Secondary | ICD-10-CM | POA: Insufficient documentation

## 2017-04-18 DIAGNOSIS — I10 Essential (primary) hypertension: Secondary | ICD-10-CM

## 2017-04-18 DIAGNOSIS — I2510 Atherosclerotic heart disease of native coronary artery without angina pectoris: Secondary | ICD-10-CM | POA: Insufficient documentation

## 2017-04-18 DIAGNOSIS — E1142 Type 2 diabetes mellitus with diabetic polyneuropathy: Secondary | ICD-10-CM | POA: Insufficient documentation

## 2017-04-18 DIAGNOSIS — E785 Hyperlipidemia, unspecified: Secondary | ICD-10-CM | POA: Insufficient documentation

## 2017-04-18 DIAGNOSIS — Z8261 Family history of arthritis: Secondary | ICD-10-CM | POA: Insufficient documentation

## 2017-04-18 DIAGNOSIS — E1165 Type 2 diabetes mellitus with hyperglycemia: Secondary | ICD-10-CM | POA: Insufficient documentation

## 2017-04-18 DIAGNOSIS — Z1159 Encounter for screening for other viral diseases: Secondary | ICD-10-CM | POA: Insufficient documentation

## 2017-04-18 DIAGNOSIS — Z1239 Encounter for other screening for malignant neoplasm of breast: Secondary | ICD-10-CM

## 2017-04-18 DIAGNOSIS — IMO0002 Reserved for concepts with insufficient information to code with codable children: Secondary | ICD-10-CM

## 2017-04-18 DIAGNOSIS — Z807 Family history of other malignant neoplasms of lymphoid, hematopoietic and related tissues: Secondary | ICD-10-CM | POA: Insufficient documentation

## 2017-04-18 DIAGNOSIS — Z6841 Body Mass Index (BMI) 40.0 and over, adult: Secondary | ICD-10-CM | POA: Insufficient documentation

## 2017-04-18 LAB — GLUCOSE, POCT (MANUAL RESULT ENTRY): POC Glucose: 292 mg/dl — AB (ref 70–99)

## 2017-04-18 MED ORDER — CARVEDILOL 12.5 MG PO TABS: 12.5000 mg | ORAL_TABLET | Freq: Two times a day (BID) | ORAL | 6 refills | Status: DC

## 2017-04-18 MED ORDER — ATORVASTATIN CALCIUM 40 MG PO TABS: 40.0000 mg | ORAL_TABLET | Freq: Every day | ORAL | 6 refills | Status: DC

## 2017-04-18 MED ORDER — INSULIN ASPART 100 UNIT/ML ~~LOC~~ SOLN
8.0000 [IU] | Freq: Once | SUBCUTANEOUS | Status: AC
  Administered 2017-04-18: 8 [IU] via SUBCUTANEOUS

## 2017-04-18 MED ORDER — LOSARTAN POTASSIUM 25 MG PO TABS: 25.0000 mg | ORAL_TABLET | Freq: Every day | ORAL | 6 refills | Status: DC

## 2017-04-18 MED ORDER — INSULIN ASPART PROT & ASPART (70-30 MIX) 100 UNIT/ML PEN: 68.0000 [IU] | PEN_INJECTOR | Freq: Two times a day (BID) | SUBCUTANEOUS | 6 refills | Status: DC

## 2017-04-18 MED ORDER — METFORMIN HCL ER 500 MG PO TB24: 500.0000 mg | ORAL_TABLET | Freq: Every day | ORAL | 6 refills | Status: DC

## 2017-04-18 MED ORDER — NITROGLYCERIN 0.4 MG SL SUBL: SUBLINGUAL_TABLET | SUBLINGUAL | 6 refills | Status: DC

## 2017-04-18 MED ORDER — ASPIRIN EC 81 MG PO TBEC: 81.0000 mg | DELAYED_RELEASE_TABLET | Freq: Every day | ORAL | 1 refills | Status: AC

## 2017-04-18 MED FILL — METFORMIN HCL ER 500 MG TAB: 500 | 30 days supply | Qty: 30 | Fill #0

## 2017-04-18 MED FILL — LOSARTAN POTASSIUM 25 MG TA: 25 | 30 days supply | Qty: 30 | Fill #0

## 2017-04-18 MED FILL — NITROSTAT 0.4 MG TABLET SL: 0.4 | 25 days supply | Qty: 25 | Fill #0

## 2017-04-18 MED FILL — ?ATORVASTATIN 40MG TABLET: 40 | 30 days supply | Qty: 30 | Fill #0

## 2017-04-18 MED FILL — ?CARVEDILOL 12.5 MG TABLET: 12.5 | 30 days supply | Qty: 60 | Fill #0

## 2017-04-18 NOTE — Patient Instructions (Signed)
Your diabetes is uncontrolled.  Increase NovoLog 70/30 to 68 units twice a day with meals.  Add Metformin 500 mg once a day.  I have included some dietary information to help you with eating habits.  Try to exercise at least 3-4 times a week for 30 minutes. Try to get your diabetic eye exam done at [facility].  Your blood pressure is uncontrolled.  We have restarted carvedilol and lovastatin.  Follow a Healthy Eating Plan - You can do it! Limit sugary drinks.  Avoid sodas, sweet tea, sport or energy drinks, or fruit drinks.  Drink water, lo-fat milk, or diet drinks. Limit snack foods.   Cut back on candy, cake, cookies, chips, ice cream.  These are a special treat, only in small amounts. Eat plenty of vegetables.  Especially dark green, red, and orange vegetables. Aim for at least 3 servings a day. More is better! Include fruit in your daily diet.  Whole fruit is much healthier than fruit juice! Limit "white" bread, "white" pasta, "white" rice.   Choose "100% whole grain" products, brown or wild rice. Avoid fatty meats. Try "Meatless Monday" and choose eggs or beans one day a week.  When eating meat, choose lean meats like chicken, [place], and fish.  Grill, broil, or bake meats instead of frying, and eat poultry without the skin. Eat less salt.  Avoid frozen pizzas, frozen dinners and salty foods.  Use seasonings other than salt in cooking.  This can help blood pressure and keep you from swelling Beer, wine and liquor have calories.  If you can safely drink alcohol, limit to 1 drink per day for women, 2 drinks for men

## 2017-04-18 NOTE — Progress Notes (Signed)
Patient ID: Kimberly Frazier, female    DOB: [birth year]  MRN: [id number]  CC: No chief complaint on file.   Subjective: Kimberly Frazier is a 56 y.o. female who presents for new patient visit.  Patient used to be followed by Dr. Pricilla Holm at [facility]. Pt loss insurance and is unemployed Her concerns today include:  Patient with history of diabetes with neuropathy, HTN with hypertensive heart disease, CAD, HL and obesity  1. DM: -Meds: taking Novolog 70/30 60 units BID but suppose to be on 80 BID and Amaryl. Out of Amaryl x 3 wks. She stopped Metformin >1 yr ago due to diarrhea -BS: no glucometer to check BS -Eating habits: "all out of control.  I drink soda, Coca Cola a lot." Admits to eating chips and other junk foods, eat white instead of wheat bread and loves fried foods. Exercise: not doing much since losing job 3 wks ago. 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 + blurred vision -+ tingling in feet. Not taking Gabapentin. "Now that I'm not on my feet all day, I have a livable pain."   2. HTN:  -suppose to be on Norvasc, Cozaar/HCTZ, Isosorbide and Coreg but only has Coreg bottle today, which is empty empty. Out x 3 wks. No device to check -tries to limit salt -endorses SOB "when I call myself trying to exercise." No CP/LE edema +headaches often  3. HL/CAD: Had cardiac cath in 2016 with 80% blockages in distal LAD and mid PDA. For medical management.  out of Lipitor for a while. Takes a bady ASA -no SL Nitro use.  Nonsmoker Use marijuana 2 x a mth -occasional ETOH use.  HM: decline flu shot. Agreeable to MMG. Will schedule eye exam  Social history/family history and past surgical history reviewed  Patient Active Problem List   Diagnosis Date Noted  . Diabetic peripheral neuropathy ([place]) 09/03/2016  . Headache 01/19/2016  . CAD (coronary artery disease) 04/10/2015  . Neck pain on right side 06/20/2014  . Severe obesity (BMI >= 40)  ([place]) 09/20/2013  . 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 Hyperlipidemia 10/25/2012  . Diabetes mellitus type 2, uncontrolled, with complications ([place]) 08/65/7846  . Uncontrolled hypertension 07/26/2012     Current Outpatient Medications on File Prior to Visit  Medication Sig Dispense Refill  . amLODipine (NORVASC) 10 MG tablet Take 1 tablet (10 mg total) by mouth daily. 90 tablet 3  . aspirin EC 81 MG tablet Take 81 mg by mouth daily.    Marland Kitchen atorvastatin (LIPITOR) 40 MG tablet Take 1 tablet (40 mg total) by mouth daily. 31 tablet 6  . BD PEN NEEDLE NANO U/F 32G X 4 MM MISC USE TWICE DAILY AS DIRECTED 100 each 0  . carvedilol (COREG) 12.5 MG tablet Take 1 tablet (12.5 mg total) by mouth 2 (two) times daily. 180 tablet 1  . carvedilol (COREG) 12.5 MG tablet Take 1 tablet (12.5 mg total) by mouth 2 (two) times daily. *Patient is overdue for an appt. Please call and schedule for further refills* 60 tablet 0  . gabapentin (NEURONTIN) 300 MG capsule Take 1 capsule (300 mg total) by mouth 3 (three) times daily. 90 capsule 3  . glimepiride (AMARYL) 4 MG tablet TAKE 1 TABLET BY MOUTH EVERY DAY WITH BREAKFAST 90 tablet 0  . glucose blood (FREESTYLE LITE) test strip Use as instructed 100 each 12  . insulin aspart protamine - aspart (NOVOLOG MIX 70/30 FLEXPEN) (  70-30) 100 UNIT/ML FlexPen Inject 0.8-0.9 mLs (80-90 Units total) into the skin 2 (two) times daily. 50 mL 6  . isosorbide mononitrate (IMDUR) 30 MG 24 hr tablet Take 30 mg by mouth daily.  6  . Liniments (SALONPAS) PADS Apply 1 each topically at bedtime as needed (foot pain). Reported on 07/15/2015    . losartan-hydrochlorothiazide (HYZAAR) 100-25 MG tablet Take 1 tablet by mouth daily. 90 tablet 3  . meloxicam (MOBIC) 15 MG tablet TAKE 1 TABLET BY MOUTH DAILY 15 tablet 0  . metFORMIN (GLUCOPHAGE) 500 MG tablet Take 1 tablet (500 mg total) by mouth 2 (two) times daily with a meal. 180 tablet 3  . nitroGLYCERIN (NITROSTAT) 0.4 MG SL tablet Place 1  tab under tongue for chest pain.  May repeat after 5 minutes x 2.  DO NOT TAKE MORE THAN 3 TABS DURING AN EPISODE OF CHEST PAIN 25 tablet 6  . Potassium Chloride ER 20 MEQ TBCR Take 1 tablet daily by mouth. 90 tablet 0  . potassium chloride SA (K-DUR,KLOR-CON) 20 MEQ tablet TAKE ONE TABLET BY MOUTH ONE TIME DAILY 30 tablet 2   No current facility-administered medications on file prior to visit.     No Known Allergies  Social History   Socioeconomic History  . Marital status: Single    Spouse name: Not on file  . Number of children: 2  . Years of education: 93  . Highest education level: Not on file  Social Needs  . Financial resource strain: Not on file  . Food insecurity - worry: Not on file  . Food insecurity - inability: Not on file  . Transportation needs - medical: Not on file  . Transportation needs - non-medical: Not on file  Occupational History  . 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 Smoking status: Never Smoker  . Smokeless tobacco: Never Used  Substance and Sexual Activity  . Alcohol use: Yes    Comment: occasional  . Drug use: Yes    Types: Marijuana  . Sexual activity: Yes  Other Topics Concern  . Not on file  Social History Narrative   Regular exercise-no   No caffeine use    Family History  Problem Relation Age of Onset  . Cancer Other        parent  . Diabetes Other        parent  . Heart disease Other        parent  . Hyperlipidemia Other        parent  . Hypertension Other        parent  . Arthritis Other        parent  . Heart disease Mother   . Hypertension Mother   . Diabetes Mother   . Cancer Father        hodgkins lymphoma  . Heart disease Sister        heart attack  . Diabetes Sister   . Diabetes Maternal Aunt   . Diabetes Maternal Uncle     Past Surgical History:  Procedure Laterality Date  . BREAST BIOPSY  2012  . Left Heart Cath and Coronary Angiography N/A 02/06/2015   Performed by Larey Dresser, MD  at [facility]    ROS: Review of Systems  Constitutional: Negative for appetite change. Activity change: not as active since no longer working.  Eyes: Negative for visual disturbance.  Respiratory: Positive for shortness of breath. Negative for cough  and chest tightness.   Cardiovascular: Negative for chest pain and palpitations.  Gastrointestinal: Negative for blood in stool.  Genitourinary: Negative for difficulty urinating.  Neurological: Negative for dizziness.    PHYSICAL EXAM: BP (!) 168/107 (BP Location: Left Arm, Patient Position: Sitting, Cuff Size: Large)   Pulse (!) 111   Temp 99.4 F (37.4 C) (Oral)   Resp 18   Ht 5\' 6"  (1.676 m)   Wt 260 lb (117.9 kg)   SpO2 98%   BMI 41.97 kg/m   Wt Readings from Last 3 Encounters:  04/18/17 260 lb (117.9 kg)  09/03/16 256 lb (116.1 kg)  01/19/16 252 lb (114.3 kg)   Physical Exam  General appearance - alert, well appearing, middle-aged obese African-American female and in no distress Mental status - alert, oriented to person, place, and time, normal mood, behavior, speech, dress, motor activity, and thought processes Eyes - pupils equal and reactive, extraocular eye movements intact Mouth - mucous membranes moist, pharynx normal without lesions Neck - supple, no significant adenopathy Chest - clear to auscultation, no wheezes, rales or rhonchi, symmetric air entry Heart - normal rate, regular rhythm, normal S1, S2, no murmurs, rubs, clicks or gallops Extremities - peripheral pulses normal, no pedal edema, no clubbing or cyanosis Diabetic Foot Exam - Simple   Simple Foot Form Visual Inspection No deformities, no ulcerations, no other skin breakdown bilaterally:  Yes Sensation Testing Intact to touch and monofilament testing bilaterally:  Yes Pulse Check Posterior Tibialis and Dorsalis pulse intact bilaterally:  Yes Comments    Results for orders placed or performed in visit on 04/18/17  Glucose (CBG)  Result  Value Ref Range   POC Glucose 292 (A) 70 - 99 mg/dl   A1C 11.4  ASSESSMENT AND PLAN: 1. Essential hypertension Not at goal.  Patient has been off of medicines except carvedilol for quite some time. Refill Coreg and add losartan Salt restriction discussed and encourage - carvedilol (COREG) 12.5 MG tablet; Take 1 tablet (12.5 mg total) 2 (two) times daily by mouth. *Patient is overdue for an appt. Please call and schedule for further refills*  Dispense: 60 tablet; Refill: 6 - losartan (COZAAR) 25 MG tablet; Take 1 tablet (25 mg total) daily by mouth.  Dispense: 30 tablet; Refill: 6  2. Uncontrolled type 2 diabetes mellitus with complication, with long-term current use of insulin (HCC) Increase NovoLog 70/30-68 units twice a day.  She is agreeable to trying extended release Metformin. She has enough NovoLog 70/30 to last about 2 months at which time we will need to switch her to something else as she does not have insurance.  We will bring her back in 7 weeks -Discussed importance of healthy eating habits and regular exercise.  She has set a goal to cut back on drinking regular sodas and change to wheat bread - CBC - Comprehensive metabolic panel - Lipid panel - insulin aspart protamine - aspart (NOVOLOG MIX 70/30 FLEXPEN) (70-30) 100 UNIT/ML FlexPen; Inject 0.68 mLs (68 Units total) 2 (two) times daily with a meal into the skin.  Dispense: 50 mL; Refill: 6 - metFORMIN (GLUCOPHAGE XR) 500 MG 24 hr tablet; Take 1 tablet (500 mg total) daily with breakfast by mouth.  Dispense: 30 tablet; Refill: 6 - insulin aspart (novoLOG) injection 8 Units - Glucose (CBG)  3. Diabetic peripheral neuropathy ([place]) -Finances a little limited and neuropathy symptoms in her extremity not to bothersome  4. Coronary artery disease involving native coronary artery of native heart without  angina pectoris -Restart Lipitor, carvedilol, lovastatin and aspirin - atorvastatin (LIPITOR) 40 MG tablet; Take 1 tablet (40 mg  total) daily by mouth.  Dispense: 30 tablet; Refill: 6 - carvedilol (COREG) 12.5 MG tablet; Take 1 tablet (12.5 mg total) 2 (two) times daily by mouth. *Patient is overdue for an appt. Please call and schedule for further refills*  Dispense: 60 tablet; Refill: 6 - losartan (COZAAR) 25 MG tablet; Take 1 tablet (25 mg total) daily by mouth.  Dispense: 30 tablet; Refill: 6 - nitroGLYCERIN (NITROSTAT) 0.4 MG SL tablet; Place 1 tab under tongue for chest pain.  May repeat after 5 minutes x 2.  DO NOT TAKE MORE THAN 3 TABS DURING AN EPISODE OF CHEST PAIN  Dispense: 25 tablet; Refill: 6  5. Breast cancer screening - MM Digital Screening; Future  6. Influenza vaccination declined   7. Screening for HIV (human immunodeficiency virus) - HIV antibody  8. Need for hepatitis C screening test - Hepatitis C Antibody  Patient was given the opportunity to ask questions.  Patient verbalized understanding of the plan and was able to repeat key elements of the plan.   No orders of the defined types were placed in this encounter.    Requested Prescriptions    No prescriptions requested or ordered in this encounter    No Follow-up on file.  Karle Plumber, MD, FACP

## 2017-04-19 LAB — COMPREHENSIVE METABOLIC PANEL
A/G RATIO: 1.3 (ref 1.2–2.2)
ALBUMIN: 4.2 g/dL (ref 3.5–5.5)
ALT: 20 IU/L (ref 0–32)
AST: 18 IU/L (ref 0–40)
Alkaline Phosphatase: 119 IU/L — ABNORMAL HIGH (ref 39–117)
BILIRUBIN TOTAL: 0.4 mg/dL (ref 0.0–1.2)
BUN / CREAT RATIO: 19 (ref 9–23)
BUN: 16 mg/dL (ref 6–24)
CALCIUM: 9.5 mg/dL (ref 8.7–10.2)
CO2: 26 mmol/L (ref 20–29)
Chloride: 98 mmol/L (ref 96–106)
Creatinine, Ser: 0.83 mg/dL (ref 0.57–1.00)
GFR, EST AFRICAN AMERICAN: 90 mL/min/{1.73_m2} (ref >59)
GFR, EST NON AFRICAN AMERICAN: 78 mL/min/{1.73_m2} (ref >59)
Globulin, Total: 3.3 g/dL (ref 1.5–4.5)
Glucose: 304 mg/dL — ABNORMAL HIGH (ref 65–99)
POTASSIUM: 3.4 mmol/L — AB (ref 3.5–5.2)
Sodium: 140 mmol/L (ref 134–144)
TOTAL PROTEIN: 7.5 g/dL (ref 6.0–8.5)

## 2017-04-19 LAB — CBC
HEMOGLOBIN: 13.9 g/dL (ref 11.1–15.9)
Hematocrit: 41.7 % (ref 34.0–46.6)
MCH: 29.6 pg (ref 26.6–33.0)
MCHC: 33.3 g/dL (ref 31.5–35.7)
MCV: 89 fL (ref 79–97)
PLATELETS: 253 10*3/uL (ref 150–379)
RBC: 4.7 x10E6/uL (ref 3.77–5.28)
RDW: 13.7 % (ref 12.3–15.4)
WBC: 11.9 10*3/uL — AB (ref 3.4–10.8)

## 2017-04-19 LAB — LIPID PANEL
CHOL/HDL RATIO: 5.6 ratio — AB (ref 0.0–4.4)
Cholesterol, Total: 234 mg/dL — ABNORMAL HIGH (ref 100–199)
HDL: 42 mg/dL (ref >39)
LDL Calculated: 147 mg/dL — ABNORMAL HIGH (ref 0–99)
Triglycerides: 225 mg/dL — ABNORMAL HIGH (ref 0–149)
VLDL CHOLESTEROL CAL: 45 mg/dL — AB (ref 5–40)

## 2017-04-19 LAB — HIV ANTIBODY (ROUTINE TESTING W REFLEX): HIV Screen 4th Generation wRfx: NONREACTIVE

## 2017-04-19 LAB — HEPATITIS C ANTIBODY

## 2017-04-20 ENCOUNTER — Other Ambulatory Visit: Payer: Self-pay | Admitting: Internal Medicine

## 2017-04-20 DIAGNOSIS — E876 Hypokalemia: Secondary | ICD-10-CM

## 2017-04-20 NOTE — Progress Notes (Signed)
Order placed for K+ level.

## 2017-05-06 ENCOUNTER — Telehealth: Payer: Self-pay | Admitting: *Deleted

## 2017-05-06 NOTE — Telephone Encounter (Signed)
Patient verified DOB Patient is aware of blood count being normal except for mild WBC elevation. Patient is aware of it being stable compared to last year.  Patient encouraged to take atorvastatin to address cholesterol elevation. Patient is also aware of potassium being low and HIV/HEP C being negative. Patient scheduled for 05/11/17 at 1:30pm to complete recheck.  No further questions.

## 2017-05-06 NOTE — Telephone Encounter (Signed)
-----   Message from Ladell Pier, MD sent at 04/20/2017  2:12 PM EST ----- Let patient know that her blood count was normal except for mild elevation in white blood cell count.  This is stable compared to last year.Her cholesterol is elevated.  Encourage her to take the atorvastatin as prescribed.  HIV tests and hepatitis C screening tests were negative.  Potassium level was a little low.  I would like for her to return to the laboratory to have levels rechecked. Order placed.

## 2017-05-09 ENCOUNTER — Ambulatory Visit: Payer: Self-pay

## 2017-05-11 ENCOUNTER — Other Ambulatory Visit: Payer: Self-pay

## 2017-05-13 ENCOUNTER — Other Ambulatory Visit: Payer: Self-pay | Admitting: Obstetrics and Gynecology

## 2017-05-18 ENCOUNTER — Ambulatory Visit: Payer: Self-pay

## 2017-05-26 ENCOUNTER — Ambulatory Visit: Payer: Self-pay | Attending: Internal Medicine

## 2017-05-26 DIAGNOSIS — E876 Hypokalemia: Secondary | ICD-10-CM | POA: Insufficient documentation

## 2017-05-26 NOTE — Progress Notes (Signed)
Patient here for lab visit only 

## 2017-05-27 ENCOUNTER — Other Ambulatory Visit: Payer: Self-pay | Admitting: Internal Medicine

## 2017-05-27 LAB — POTASSIUM: POTASSIUM: 3.3 mmol/L — AB (ref 3.5–5.2)

## 2017-05-27 MED ORDER — POTASSIUM CHLORIDE ER 8 MEQ PO TBCR: 16.0000 meq | EXTENDED_RELEASE_TABLET | Freq: Every day | ORAL | 5 refills | Status: DC

## 2017-05-30 ENCOUNTER — Telehealth: Payer: Self-pay

## 2017-05-30 NOTE — Telephone Encounter (Signed)
Contacted pt to go over lab results pt didn't answer left a detailed message regarding results   If pt calls back and want results again please give results: potassium level is still low. I recommend adding low dose of potassium supplement. Rxn sent to [facility] pharmacy.

## 2017-06-07 ENCOUNTER — Ambulatory Visit: Payer: Self-pay | Attending: Internal Medicine | Admitting: Internal Medicine

## 2017-06-07 VITALS — BP 167/117 | HR 86 | Temp 98.6°F | Resp 16 | Wt 263.2 lb

## 2017-06-07 DIAGNOSIS — E1140 Type 2 diabetes mellitus with diabetic neuropathy, unspecified: Secondary | ICD-10-CM | POA: Insufficient documentation

## 2017-06-07 DIAGNOSIS — Z7982 Long term (current) use of aspirin: Secondary | ICD-10-CM | POA: Insufficient documentation

## 2017-06-07 DIAGNOSIS — Z794 Long term (current) use of insulin: Secondary | ICD-10-CM | POA: Insufficient documentation

## 2017-06-07 DIAGNOSIS — Z79899 Other long term (current) drug therapy: Secondary | ICD-10-CM | POA: Insufficient documentation

## 2017-06-07 DIAGNOSIS — I119 Hypertensive heart disease without heart failure: Secondary | ICD-10-CM | POA: Insufficient documentation

## 2017-06-07 DIAGNOSIS — E785 Hyperlipidemia, unspecified: Secondary | ICD-10-CM | POA: Insufficient documentation

## 2017-06-07 DIAGNOSIS — I2510 Atherosclerotic heart disease of native coronary artery without angina pectoris: Secondary | ICD-10-CM | POA: Insufficient documentation

## 2017-06-07 DIAGNOSIS — I10 Essential (primary) hypertension: Secondary | ICD-10-CM

## 2017-06-07 DIAGNOSIS — E118 Type 2 diabetes mellitus with unspecified complications: Secondary | ICD-10-CM

## 2017-06-07 DIAGNOSIS — IMO0002 Reserved for concepts with insufficient information to code with codable children: Secondary | ICD-10-CM

## 2017-06-07 DIAGNOSIS — E1165 Type 2 diabetes mellitus with hyperglycemia: Secondary | ICD-10-CM | POA: Insufficient documentation

## 2017-06-07 LAB — GLUCOSE, POCT (MANUAL RESULT ENTRY): POC Glucose: 232 mg/dl — AB (ref 70–99)

## 2017-06-07 LAB — POCT GLYCOSYLATED HEMOGLOBIN (HGB A1C): HEMOGLOBIN A1C: 12.8

## 2017-06-07 MED ORDER — TRUE METRIX METER W/DEVICE KIT: PACK | 0 refills | Status: DC

## 2017-06-07 MED ORDER — CARVEDILOL 25 MG PO TABS: 25.0000 mg | ORAL_TABLET | Freq: Two times a day (BID) | ORAL | 6 refills | Status: DC

## 2017-06-07 MED ORDER — TRUEPLUS LANCETS 28G MISC: 6 refills | Status: DC

## 2017-06-07 MED ORDER — INSULIN GLARGINE 100 UNIT/ML SOLOSTAR PEN: 80.0000 [IU] | PEN_INJECTOR | Freq: Every day | SUBCUTANEOUS | 99 refills | Status: DC

## 2017-06-07 MED ORDER — GLUCOSE BLOOD VI STRP: ORAL_STRIP | 12 refills | Status: DC

## 2017-06-07 MED ORDER — INSULIN ASPART 100 UNIT/ML FLEXPEN: 25.0000 [IU] | PEN_INJECTOR | Freq: Three times a day (TID) | SUBCUTANEOUS | 11 refills | Status: DC

## 2017-06-07 MED FILL — TRUE METRIX TEST STRIP: 25 days supply | Qty: 100 | Fill #0

## 2017-06-07 MED FILL — !HUMALOG 100 UNITS/ML KWIKP: 100 | 20 days supply | Qty: 15 | Fill #0

## 2017-06-07 MED FILL — ?CARVEDILOL 25 MG TABLET: 25 | 30 days supply | Qty: 60 | Fill #0

## 2017-06-07 MED FILL — !TRUE METRIX BLOOD GLUCOSE: 365 days supply | Qty: 1 | Fill #0

## 2017-06-07 MED FILL — POTASSIUM CL ER 8 MEQ CAP: 8 | 30 days supply | Qty: 60 | Fill #0

## 2017-06-07 MED FILL — TRUEplus LANCETS 28G MISC: 25 days supply | Qty: 100 | Fill #0

## 2017-06-07 MED FILL — !LANTUS SOLOSTAR 100UNITS/M: 100 | 18 days supply | Qty: 15 | Fill #0

## 2017-06-07 NOTE — Progress Notes (Signed)
Pt is requesting a refill a liptor  Pt states she stopped taking metformin because it didn't agree with her but she took one yesterday but not today but she does take her insulin  Pt is requesting a new meter and suppiles to check sugars at home  Pt is aware of her k results

## 2017-06-07 NOTE — Progress Notes (Signed)
Patient ID: Kimberly Frazier, female    DOB: Dec 27, 1960  MRN: [id number]  CC: No chief complaint on file.   Subjective: Kimberly Frazier is a 57 y.o. female who presents for 2 mth f/u on diabetes and blood pressure. Her concerns today include:  Patient with history of diabetes with neuropathy, HTN with hypertensive heart disease, CAD, HL and obesity  1.  HTN: compliant Losartan and Coreg No device to check BP No chest pains, shortness of breath, lower extremity edema.  2. DM:  Med: she did not tolerate the long acting Metformin; still had diarrhea so she changed to taking QOD No device to check BS Reportedly taking Novolog 70/30 80 units BID instead of 68 units BID as was discussed on last visit. She reports she was suppose to be on 80 units BID from previous PCP Diet: not doing as well with eating habits.  Cut back on sodas but still drinking regular sodas and sweet tea  Patient Active Problem List   Diagnosis Date Noted  . Diabetic peripheral neuropathy ([place]) 09/03/2016  . Headache 01/19/2016  . CAD (coronary artery disease) 04/10/2015  . Neck pain on right side 06/20/2014  . Severe obesity (BMI >= 40) ([place]) 09/20/2013  . 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 Hyperlipidemia 10/25/2012  . Diabetes mellitus type 2, uncontrolled, with complications ([place]) 26/71/2458  . Essential hypertension 07/26/2012     Current Outpatient Medications on File Prior to Visit  Medication Sig Dispense Refill  . aspirin EC 81 MG tablet Take 1 tablet (81 mg total) daily by mouth. 100 tablet 1  . atorvastatin (LIPITOR) 40 MG tablet Take 1 tablet (40 mg total) daily by mouth. 30 tablet 6  . insulin aspart protamine - aspart (NOVOLOG MIX 70/30 FLEXPEN) (70-30) 100 UNIT/ML FlexPen Inject 0.68 mLs (68 Units total) 2 (two) times daily with a meal into the skin. 50 mL 6  . losartan (COZAAR) 25 MG tablet Take 1 tablet (25 mg total) daily by mouth. 30 tablet 6  . potassium chloride (KLOR-CON) 8 MEQ tablet Take 2  tablets (16 mEq total) by mouth daily. 60 tablet 5  . BD PEN NEEDLE NANO U/F 32G X 4 MM MISC USE TWICE DAILY AS DIRECTED 100 each 0  . glucose blood (FREESTYLE LITE) test strip Use as instructed (Patient not taking: Reported on 06/07/2017) 100 each 12  . meloxicam (MOBIC) 15 MG tablet TAKE 1 TABLET BY MOUTH DAILY 15 tablet 0  . nitroGLYCERIN (NITROSTAT) 0.4 MG SL tablet Place 1 tab under tongue for chest pain.  May repeat after 5 minutes x 2.  DO NOT TAKE MORE THAN 3 TABS DURING AN EPISODE OF CHEST PAIN 25 tablet 6   No current facility-administered medications on file prior to visit.     No Known Allergies  Social History   Socioeconomic History  . Marital status: Single    Spouse name: Not on file  . Number of children: 2  . Years of education: 29  . Highest education level: Not on file  Social Needs  . Financial resource strain: Not on file  . Food insecurity - worry: Not on file  . Food insecurity - inability: Not on file  . Transportation needs - medical: Not on file  . Transportation needs - non-medical: Not on file  Occupational History  . 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 Smoking status: Never Smoker  . Smokeless tobacco: Never Used  Substance and Sexual Activity  .  Alcohol use: Yes    Comment: occasional  . Drug use: Yes    Types: Marijuana  . Sexual activity: Yes  Other Topics Concern  . Not on file  Social History Narrative   Regular exercise-no   No caffeine use    Family History  Problem Relation Age of Onset  . Cancer Other        parent  . Diabetes Other        parent  . Heart disease Other        parent  . Hyperlipidemia Other        parent  . Hypertension Other        parent  . Arthritis Other        parent  . Heart disease Mother   . Hypertension Mother   . Diabetes Mother   . Cancer Father        hodgkins lymphoma  . Heart disease Sister        heart attack  . Diabetes Sister   . Diabetes Maternal Aunt   .  Diabetes Maternal Uncle     Past Surgical History:  Procedure Laterality Date  . BREAST BIOPSY  2012  . CARDIAC CATHETERIZATION N/A 02/06/2015   Procedure: Left Heart Cath and Coronary Angiography;  Surgeon: Larey Dresser, MD;  Location: [facility];  Service: Cardiovascular;  Laterality: N/A;    ROS: Review of Systems Neg except as above PHYSICAL EXAM: BP (!) 167/117   Pulse 86   Temp 98.6 F (37 C) (Oral)   Resp 16   Wt 263 lb 3.2 oz (119.4 kg)   SpO2 96%   BMI 42.48 kg/m   Physical Exam  General appearance - alert, well appearing, and in no distress Mental status - alert, oriented to person, place, and time, normal mood, behavior, speech, dress, motor activity, and thought processes Neck - supple, no significant adenopathy Chest - clear to auscultation, no wheezes, rales or rhonchi, symmetric air entry Heart - normal rate, regular rhythm, normal S1, S2, no murmurs, rubs, clicks or gallops Extremities - peripheral pulses normal, no pedal edema, no clubbing or cyanosis   Results for orders placed or performed in visit on 06/07/17  POCT glucose (manual entry)  Result Value Ref Range   POC Glucose 232 (A) 70 - 99 mg/dl  POCT glycosylated hemoglobin (Hb A1C)  Result Value Ref Range   Hemoglobin A1C 12.8     ASSESSMENT AND PLAN: 1. Diabetes mellitus type 2, uncontrolled, with complications ([place]) She is almost out of NovoLog 70/30.  Plan was to change her to Lantus and NovoLog once she was done with her current insulin Change to Lantus 80 units at bedtime and NovoLog 25 units 3 times a day with meals.  Keep a log of blood sugar and bring in on next visit. -Prescription written for her to get glucometer and strips. Discussed the importance of good eating habits. - POCT glucose (manual entry) - POCT glycosylated hemoglobin (Hb A1C) - Insulin Glargine (LANTUS SOLOSTAR) 100 UNIT/ML Solostar Pen; Inject 80 Units into the skin at bedtime.  Dispense: 5 pen; Refill:  PRN - insulin aspart (NOVOLOG FLEXPEN) 100 UNIT/ML FlexPen; Inject 25 Units into the skin 3 (three) times daily with meals.  Dispense: 15 mL; Refill: 11 - Blood Glucose Monitoring Suppl (TRUE METRIX METER) w/Device KIT; Use as directed  Dispense: 1 kit; Refill: 0 - glucose blood (TRUE METRIX BLOOD GLUCOSE TEST) test strip; Use as instructed  Dispense:  100 each; Refill: 12 - TRUEPLUS LANCETS 28G MISC; Use as directted  Dispense: 100 each; Refill: 6  2. Essential hypertension Not at goal.  Increase carvedilol to 25 mg twice daily. - carvedilol (COREG) 25 MG tablet; Take 1 tablet (25 mg total) by mouth 2 (two) times daily. *Patient is overdue for an appt. Please call and schedule for further refills*  Dispense: 60 tablet; Refill: 6  3. Coronary artery disease involving native coronary artery of native heart without angina pectoris - carvedilol (COREG) 25 MG tablet; Take 1 tablet (25 mg total) by mouth 2 (two) times daily. *Patient is overdue for an appt. Please call and schedule for further refills*  Dispense: 60 tablet; Refill: 6   Patient was given the opportunity to ask questions.  Patient verbalized understanding of the plan and was able to repeat key elements of the plan.   Orders Placed This Encounter  Procedures  . POCT glucose (manual entry)  . POCT glycosylated hemoglobin (Hb A1C)     Requested Prescriptions   Signed Prescriptions Disp Refills  . carvedilol (COREG) 25 MG tablet 60 tablet 6    Sig: Take 1 tablet (25 mg total) by mouth 2 (two) times daily. *Patient is overdue for an appt. Please call and schedule for further refills*  . Insulin Glargine (LANTUS SOLOSTAR) 100 UNIT/ML Solostar Pen 5 pen PRN    Sig: Inject 80 Units into the skin at bedtime.  . insulin aspart (NOVOLOG FLEXPEN) 100 UNIT/ML FlexPen 15 mL 11    Sig: Inject 25 Units into the skin 3 (three) times daily with meals.  . Blood Glucose Monitoring Suppl (TRUE METRIX METER) w/Device KIT 1 kit 0    Sig: Use as  directed  . glucose blood (TRUE METRIX BLOOD GLUCOSE TEST) test strip 100 each 12    Sig: Use as instructed  . TRUEPLUS LANCETS 28G MISC 100 each 6    Sig: Use as directted    Return in about 1 month (around 07/08/2017).  Karle Plumber, MD, FACP

## 2017-06-07 NOTE — Patient Instructions (Signed)
Stop Metformin. Start Lantus 80 units at nights Start Novolog 25 units with meals. Increase Carvedilol to 25 mg twice a day.

## 2017-06-13 MED FILL — LOSARTAN POTASSIUM 25 MG TA: 25 | 30 days supply | Qty: 30 | Fill #1

## 2017-06-13 MED FILL — ?ATORVASTATIN 40MG TABLET: 40 | 30 days supply | Qty: 30 | Fill #1

## 2017-06-15 ENCOUNTER — Other Ambulatory Visit (HOSPITAL_COMMUNITY): Payer: Self-pay | Admitting: *Deleted

## 2017-06-15 DIAGNOSIS — N644 Mastodynia: Secondary | ICD-10-CM

## 2017-06-16 ENCOUNTER — Ambulatory Visit
Admission: RE | Admit: 2017-06-16 | Discharge: 2017-06-16 | Disposition: A | Discharge status: Home / Self Care | Payer: No Typology Code available for payment source | Source: Ambulatory Visit | Attending: Obstetrics and Gynecology | Admitting: Obstetrics and Gynecology

## 2017-06-16 ENCOUNTER — Ambulatory Visit (HOSPITAL_COMMUNITY)
Admission: RE | Admit: 2017-06-16 | Discharge: 2017-06-16 | Disposition: A | Discharge status: Home / Self Care | Payer: Self-pay | Source: Ambulatory Visit | Attending: Obstetrics and Gynecology | Admitting: Obstetrics and Gynecology

## 2017-06-16 ENCOUNTER — Ambulatory Visit: Payer: Self-pay

## 2017-06-16 ENCOUNTER — Encounter (HOSPITAL_COMMUNITY): Payer: Self-pay

## 2017-06-16 VITALS — BP 160/102 | Ht 64.0 in | Wt 269.4 lb

## 2017-06-16 DIAGNOSIS — Z1239 Encounter for other screening for malignant neoplasm of breast: Secondary | ICD-10-CM

## 2017-06-16 DIAGNOSIS — N644 Mastodynia: Secondary | ICD-10-CM

## 2017-06-16 NOTE — Progress Notes (Addendum)
Complaints of diffuse bilateral breast pain for over a year that has decreased since November. Patient states the pain comes and goes. Patient rates that pain at a 6-7 out of 10.  Pap Smear: Pap smear not completed today. Last Pap smear was 01/01/2015 at [facility] and normal with negative HPV. Per patient has a history of cervical cancer 20 years ago that was treated with radiation and chemotherapy. Patient stated that all Pap smears have been normal since completed her treatment. Last Pap smear result is in Epic.  Physical exam: Breasts Breasts symmetrical. No skin abnormalities bilateral breasts. No nipple retraction bilateral breasts. No nipple discharge bilateral breasts. No lymphadenopathy. No lumps palpated bilateral breasts. Complaints of bilateral inner breast tenderness on exam. Referred patient to the [facility] for diagnostic mammogram. Appointment scheduled for Thursday, June 16, 2017 at 1450.        Pelvic/Bimanual No Pap smear completed today since last Pap smear and HPV typing was 01/01/2015. Pap smear not indicated per BCCCP guidelines.   Smoking History: Patient has never smoked.  Patient Navigation: Patient education provided. Access to services provided for patient through [place] program.   Colorectal Cancer Screening: Per patient had a colonoscopy completed 10 years ago. No complaints today. FIT Test given to patient to complete and return to BCCCP.

## 2017-06-16 NOTE — Patient Instructions (Signed)
Explained breast self awareness with Kimberly Frazier. Patient did not need a Pap smear today due to last Pap smear and HPV typing was 01/01/2015. Due to patient's history of cervical cancer recommended her to get a Pap smear in August 2019. Referred patient to the [facility] for diagnostic mammogram. Appointment scheduled for Thursday, June 16, 2017 at 1450. Kimberly Frazier verbalized understanding.  Kallum Jorgensen, Arvil Chaco, RN 1:20 PM

## 2017-06-17 ENCOUNTER — Other Ambulatory Visit: Payer: Self-pay | Admitting: Obstetrics and Gynecology

## 2017-06-20 ENCOUNTER — Encounter (HOSPITAL_COMMUNITY): Payer: Self-pay | Admitting: *Deleted

## 2017-06-22 ENCOUNTER — Ambulatory Visit: Payer: Self-pay | Attending: Internal Medicine

## 2017-06-23 LAB — FECAL OCCULT BLOOD, IMMUNOCHEMICAL: FECAL OCCULT BLD: NEGATIVE

## 2017-07-12 ENCOUNTER — Encounter (HOSPITAL_COMMUNITY): Payer: Self-pay | Admitting: *Deleted

## 2017-07-12 ENCOUNTER — Ambulatory Visit: Payer: Self-pay | Admitting: Internal Medicine

## 2017-07-12 NOTE — Progress Notes (Signed)
Letter mailed to patient with negative Fit Test results.  

## 2017-07-21 MED FILL — !HUMALOG 100 UNITS/ML KWIKP: 100 | 20 days supply | Qty: 15 | Fill #1

## 2017-07-21 MED FILL — !LANTUS SOLOSTAR 100UNITS/M: 100 | 18 days supply | Qty: 15 | Fill #1

## 2017-09-09 MED FILL — POTASSIUM CL ER 8 MEQ CAP: 8 | 30 days supply | Qty: 60 | Fill #1

## 2017-09-09 MED FILL — ATORVASTATIN CALCIUM 40 MG: 40 | 30 days supply | Qty: 30 | Fill #2

## 2017-09-09 MED FILL — CARVEDILOL 25 MG TABLET: 25 | 30 days supply | Qty: 60 | Fill #1

## 2017-10-07 ENCOUNTER — Ambulatory Visit: Payer: Self-pay

## 2017-10-10 ENCOUNTER — Ambulatory Visit: Payer: Self-pay | Attending: Internal Medicine

## 2017-11-03 MED FILL — !HUMALOG 100 UNITS/ML KWIKP: 100 | 20 days supply | Qty: 15 | Fill #2

## 2017-11-03 MED FILL — !LANTUS SOLOSTAR 100UNITS/M: 100 | 18 days supply | Qty: 15 | Fill #2

## 2017-11-07 ENCOUNTER — Other Ambulatory Visit: Payer: Self-pay

## 2017-11-07 MED ORDER — INSULIN PEN NEEDLE 32G X 4 MM MISC: 12 refills | Status: DC

## 2017-12-06 ENCOUNTER — Ambulatory Visit: Payer: Self-pay | Attending: Internal Medicine | Admitting: Internal Medicine

## 2017-12-06 ENCOUNTER — Other Ambulatory Visit: Payer: Self-pay | Admitting: Internal Medicine

## 2017-12-06 DIAGNOSIS — Z794 Long term (current) use of insulin: Secondary | ICD-10-CM

## 2017-12-06 DIAGNOSIS — E118 Type 2 diabetes mellitus with unspecified complications: Secondary | ICD-10-CM

## 2017-12-06 DIAGNOSIS — E1165 Type 2 diabetes mellitus with hyperglycemia: Secondary | ICD-10-CM

## 2017-12-06 DIAGNOSIS — I2510 Atherosclerotic heart disease of native coronary artery without angina pectoris: Secondary | ICD-10-CM

## 2017-12-06 DIAGNOSIS — I10 Essential (primary) hypertension: Secondary | ICD-10-CM

## 2017-12-06 DIAGNOSIS — IMO0002 Reserved for concepts with insufficient information to code with codable children: Secondary | ICD-10-CM

## 2017-12-06 MED ORDER — INSULIN GLARGINE 100 UNIT/ML SOLOSTAR PEN: 80.0000 [IU] | PEN_INJECTOR | Freq: Every day | SUBCUTANEOUS | 99 refills | Status: DC

## 2017-12-06 MED ORDER — POTASSIUM CHLORIDE ER 8 MEQ PO TBCR: 16.0000 meq | EXTENDED_RELEASE_TABLET | Freq: Every day | ORAL | 5 refills | Status: DC

## 2017-12-06 MED ORDER — LOSARTAN POTASSIUM 25 MG PO TABS: 25.0000 mg | ORAL_TABLET | Freq: Every day | ORAL | 3 refills | Status: DC

## 2017-12-07 MED FILL — !LANTUS SOLOSTAR 100UNITS/M: 100 | 18 days supply | Qty: 15 | Fill #3

## 2017-12-07 MED FILL — POTASSIUM CL ER 8 MEQ CAP: 8 | 30 days supply | Qty: 60 | Fill #0

## 2017-12-07 MED FILL — LOSARTAN POTASSIUM 25 MG TA: 25 | 30 days supply | Qty: 30 | Fill #0

## 2017-12-09 MED FILL — TRUEPLUS PEN NDL 32GX5/32": 32G X 4 MM | 25 days supply | Qty: 100 | Fill #0

## 2017-12-09 MED FILL — TRUEPLUS PEN NDL 32GX5/32: 32G X 4 MM | 25 days supply | Qty: 100 | Fill #0

## 2017-12-09 MED FILL — ATORVASTATIN CALCIUM 40 MG: 40 | 30 days supply | Qty: 30 | Fill #3

## 2017-12-09 MED FILL — CARVEDILOL 25 MG TABLET: 25 | 30 days supply | Qty: 60 | Fill #2

## 2017-12-28 ENCOUNTER — Telehealth: Payer: Self-pay | Admitting: Internal Medicine

## 2017-12-28 DIAGNOSIS — R300 Dysuria: Secondary | ICD-10-CM

## 2017-12-28 MED ORDER — CIPROFLOXACIN HCL 500 MG PO TABS: 500.0000 mg | ORAL_TABLET | Freq: Two times a day (BID) | ORAL | 0 refills | Status: DC

## 2017-12-28 NOTE — Telephone Encounter (Signed)
PC returned to pt. I want to speak with her to inquire about her symptoms. I left message on VM informing her to come to lab to give urine specimen for me to sent off.  Will sent rxn to pharmacy for abx

## 2017-12-28 NOTE — Telephone Encounter (Signed)
Contacted pt to see what she wanted to talk about pt states she thinks she has UTI and she is wanting Dr. Wynetta Emery to prescribe an antibiotic. I informed pt that Dr. Wynetta Emery would not be able to prescribe an antibiotic without seeing her and doing an urinalysis. Pt states she doesn't see Dr. Wynetta Emery till the 12th. I informed pt that I can transfer her to the front so they can schedule her an appointment on the walk-in schedule or next available appointment. Pt states okay and she will wait to schedule appointment. Pt states if it gets bad she will call or go to the urgent care

## 2017-12-28 NOTE — Telephone Encounter (Signed)
Pt called to request a nurse call back, she did not want to disclose the problem she had. Please follow up

## 2017-12-29 ENCOUNTER — Ambulatory Visit: Payer: Self-pay

## 2017-12-30 ENCOUNTER — Emergency Department (HOSPITAL_COMMUNITY): Payer: Self-pay

## 2017-12-30 ENCOUNTER — Encounter (HOSPITAL_COMMUNITY): Payer: Self-pay | Admitting: Emergency Medicine

## 2017-12-30 ENCOUNTER — Other Ambulatory Visit: Payer: Self-pay

## 2017-12-30 ENCOUNTER — Inpatient Hospital Stay (HOSPITAL_COMMUNITY)
Admission: EM | Admit: 2017-12-30 | Discharge: 2018-01-03 | DRG: 872 | Disposition: A | Discharge status: Home / Self Care | Payer: Self-pay | Attending: Family Medicine | Admitting: Family Medicine

## 2017-12-30 DIAGNOSIS — E785 Hyperlipidemia, unspecified: Secondary | ICD-10-CM | POA: Diagnosis present

## 2017-12-30 DIAGNOSIS — E118 Type 2 diabetes mellitus with unspecified complications: Secondary | ICD-10-CM

## 2017-12-30 DIAGNOSIS — Z8619 Personal history of other infectious and parasitic diseases: Secondary | ICD-10-CM

## 2017-12-30 DIAGNOSIS — Z6841 Body Mass Index (BMI) 40.0 and over, adult: Secondary | ICD-10-CM

## 2017-12-30 DIAGNOSIS — E7800 Pure hypercholesterolemia, unspecified: Secondary | ICD-10-CM

## 2017-12-30 DIAGNOSIS — N179 Acute kidney failure, unspecified: Secondary | ICD-10-CM

## 2017-12-30 DIAGNOSIS — IMO0002 Reserved for concepts with insufficient information to code with codable children: Secondary | ICD-10-CM | POA: Diagnosis present

## 2017-12-30 DIAGNOSIS — Z9114 Patient's other noncompliance with medication regimen: Secondary | ICD-10-CM

## 2017-12-30 DIAGNOSIS — D72829 Elevated white blood cell count, unspecified: Secondary | ICD-10-CM

## 2017-12-30 DIAGNOSIS — I2510 Atherosclerotic heart disease of native coronary artery without angina pectoris: Secondary | ICD-10-CM | POA: Diagnosis present

## 2017-12-30 DIAGNOSIS — T368X5A Adverse effect of other systemic antibiotics, initial encounter: Secondary | ICD-10-CM | POA: Diagnosis not present

## 2017-12-30 DIAGNOSIS — A419 Sepsis, unspecified organism: Secondary | ICD-10-CM | POA: Diagnosis present

## 2017-12-30 DIAGNOSIS — N390 Urinary tract infection, site not specified: Secondary | ICD-10-CM

## 2017-12-30 DIAGNOSIS — Z8249 Family history of ischemic heart disease and other diseases of the circulatory system: Secondary | ICD-10-CM

## 2017-12-30 DIAGNOSIS — R410 Disorientation, unspecified: Secondary | ICD-10-CM | POA: Diagnosis present

## 2017-12-30 DIAGNOSIS — D696 Thrombocytopenia, unspecified: Secondary | ICD-10-CM | POA: Diagnosis present

## 2017-12-30 DIAGNOSIS — E1165 Type 2 diabetes mellitus with hyperglycemia: Secondary | ICD-10-CM | POA: Diagnosis present

## 2017-12-30 DIAGNOSIS — Z7982 Long term (current) use of aspirin: Secondary | ICD-10-CM

## 2017-12-30 DIAGNOSIS — F1290 Cannabis use, unspecified, uncomplicated: Secondary | ICD-10-CM | POA: Diagnosis present

## 2017-12-30 DIAGNOSIS — E1142 Type 2 diabetes mellitus with diabetic polyneuropathy: Secondary | ICD-10-CM | POA: Diagnosis present

## 2017-12-30 DIAGNOSIS — Z794 Long term (current) use of insulin: Secondary | ICD-10-CM

## 2017-12-30 DIAGNOSIS — R197 Diarrhea, unspecified: Secondary | ICD-10-CM | POA: Diagnosis not present

## 2017-12-30 DIAGNOSIS — I119 Hypertensive heart disease without heart failure: Secondary | ICD-10-CM | POA: Diagnosis present

## 2017-12-30 DIAGNOSIS — N289 Disorder of kidney and ureter, unspecified: Secondary | ICD-10-CM | POA: Diagnosis present

## 2017-12-30 DIAGNOSIS — Z833 Family history of diabetes mellitus: Secondary | ICD-10-CM

## 2017-12-30 DIAGNOSIS — F329 Major depressive disorder, single episode, unspecified: Secondary | ICD-10-CM | POA: Diagnosis present

## 2017-12-30 DIAGNOSIS — E876 Hypokalemia: Secondary | ICD-10-CM | POA: Diagnosis not present

## 2017-12-30 DIAGNOSIS — R739 Hyperglycemia, unspecified: Secondary | ICD-10-CM

## 2017-12-30 DIAGNOSIS — A4151 Sepsis due to Escherichia coli [E. coli]: Principal | ICD-10-CM | POA: Diagnosis present

## 2017-12-30 DIAGNOSIS — I10 Essential (primary) hypertension: Secondary | ICD-10-CM | POA: Diagnosis present

## 2017-12-30 DIAGNOSIS — Z79899 Other long term (current) drug therapy: Secondary | ICD-10-CM

## 2017-12-30 DIAGNOSIS — E875 Hyperkalemia: Secondary | ICD-10-CM

## 2017-12-30 HISTORY — DX: Personal history of other infectious and parasitic diseases: Z86.19

## 2017-12-30 LAB — URINALYSIS, ROUTINE W REFLEX MICROSCOPIC
Glucose, UA: 100 mg/dL — AB
KETONES UR: 15 mg/dL — AB
NITRITE: POSITIVE — AB
PH: 5.5 (ref 5.0–8.0)
Protein, ur: >300 mg/dL — AB
SPECIFIC GRAVITY, URINE: 1.025 (ref 1.005–1.030)

## 2017-12-30 LAB — CBC
HEMATOCRIT: 41.5 % (ref 36.0–46.0)
HEMOGLOBIN: 14.1 g/dL (ref 12.0–15.0)
MCH: 29.6 pg (ref 26.0–34.0)
MCHC: 34 g/dL (ref 30.0–36.0)
MCV: 87 fL (ref 78.0–100.0)
Platelets: 186 10*3/uL (ref 150–400)
RBC: 4.77 MIL/uL (ref 3.87–5.11)
RDW: 13.9 % (ref 11.5–15.5)
WBC: 21 10*3/uL — ABNORMAL HIGH (ref 4.0–10.5)

## 2017-12-30 LAB — GLUCOSE, CAPILLARY
GLUCOSE-CAPILLARY: 332 mg/dL — AB (ref 70–99)
Glucose-Capillary: 316 mg/dL — ABNORMAL HIGH (ref 70–99)
Glucose-Capillary: 236 mg/dL — ABNORMAL HIGH (ref 70–99)

## 2017-12-30 LAB — COMPREHENSIVE METABOLIC PANEL
ALBUMIN: 2.8 g/dL — AB (ref 3.5–5.0)
ALK PHOS: 116 U/L (ref 38–126)
ALT: 18 U/L (ref 0–44)
AST: 38 U/L (ref 15–41)
Anion gap: 14 (ref 5–15)
BUN: 41 mg/dL — ABNORMAL HIGH (ref 6–20)
CALCIUM: 8.6 mg/dL — AB (ref 8.9–10.3)
CO2: 22 mmol/L (ref 22–32)
CREATININE: 2.89 mg/dL — AB (ref 0.44–1.00)
Chloride: 94 mmol/L — ABNORMAL LOW (ref 98–111)
GFR calc Af Amer: 19 mL/min — ABNORMAL LOW (ref >60)
GFR calc non Af Amer: 17 mL/min — ABNORMAL LOW (ref >60)
GLUCOSE: 420 mg/dL — AB (ref 70–99)
Potassium: 5.2 mmol/L — ABNORMAL HIGH (ref 3.5–5.1)
SODIUM: 130 mmol/L — AB (ref 135–145)
Total Bilirubin: 3.4 mg/dL — ABNORMAL HIGH (ref 0.3–1.2)
Total Protein: 6.7 g/dL (ref 6.5–8.1)

## 2017-12-30 LAB — CBG MONITORING, ED
GLUCOSE-CAPILLARY: 311 mg/dL — AB (ref 70–99)
Glucose-Capillary: 447 mg/dL — ABNORMAL HIGH (ref 70–99)

## 2017-12-30 LAB — DIFFERENTIAL
Abs Immature Granulocytes: 0.2 K/uL — ABNORMAL HIGH (ref 0.0–0.1)
Basophils Absolute: 0 K/uL (ref 0.0–0.1)
Basophils Relative: 0 %
Eosinophils Absolute: 0.1 K/uL (ref 0.0–0.7)
Eosinophils Relative: 1 %
Immature Granulocytes: 1 %
Lymphocytes Relative: 5 %
Lymphs Abs: 1 K/uL (ref 0.7–4.0)
Monocytes Absolute: 1.3 K/uL — ABNORMAL HIGH (ref 0.1–1.0)
Monocytes Relative: 6 %
Neutro Abs: 18.4 K/uL — ABNORMAL HIGH (ref 1.7–7.7)
Neutrophils Relative %: 87 %

## 2017-12-30 LAB — LACTIC ACID, PLASMA
LACTIC ACID, VENOUS: 1.7 mmol/L (ref 0.5–1.9)
LACTIC ACID, VENOUS: 2 mmol/L — AB (ref 0.5–1.9)

## 2017-12-30 LAB — I-STAT CHEM 8, ED
BUN: 67 mg/dL — ABNORMAL HIGH (ref 6–20)
Calcium, Ion: 1.03 mmol/L — ABNORMAL LOW (ref 1.15–1.40)
Chloride: 96 mmol/L — ABNORMAL LOW (ref 98–111)
Creatinine, Ser: 2.8 mg/dL — ABNORMAL HIGH (ref 0.44–1.00)
Glucose, Bld: 419 mg/dL — ABNORMAL HIGH (ref 70–99)
HCT: 44 % (ref 36.0–46.0)
Hemoglobin: 15 g/dL (ref 12.0–15.0)
Potassium: 5.7 mmol/L — ABNORMAL HIGH (ref 3.5–5.1)
Sodium: 131 mmol/L — ABNORMAL LOW (ref 135–145)
TCO2: 26 mmol/L (ref 22–32)

## 2017-12-30 LAB — I-STAT CG4 LACTIC ACID, ED
Lactic Acid, Venous: 2.88 mmol/L (ref 0.5–1.9)
Lactic Acid, Venous: 1.52 mmol/L (ref 0.5–1.9)

## 2017-12-30 LAB — URINALYSIS, MICROSCOPIC (REFLEX)

## 2017-12-30 LAB — BASIC METABOLIC PANEL
Anion gap: 13 (ref 5–15)
BUN: 41 mg/dL — AB (ref 6–20)
CALCIUM: 8 mg/dL — AB (ref 8.9–10.3)
CHLORIDE: 99 mmol/L (ref 98–111)
CO2: 22 mmol/L (ref 22–32)
CREATININE: 2.77 mg/dL — AB (ref 0.44–1.00)
GFR calc Af Amer: 20 mL/min — ABNORMAL LOW (ref >60)
GFR calc non Af Amer: 18 mL/min — ABNORMAL LOW (ref >60)
Glucose, Bld: 345 mg/dL — ABNORMAL HIGH (ref 70–99)
Potassium: 3.2 mmol/L — ABNORMAL LOW (ref 3.5–5.1)
SODIUM: 134 mmol/L — AB (ref 135–145)

## 2017-12-30 LAB — I-STAT BETA HCG BLOOD, ED (MC, WL, AP ONLY): I-stat hCG, quantitative: <5 m[IU]/mL (ref <5)

## 2017-12-30 LAB — PROTIME-INR
INR: 1.22
Prothrombin Time: 15.3 seconds — ABNORMAL HIGH (ref 11.4–15.2)

## 2017-12-30 LAB — APTT: aPTT: 33 seconds (ref 24–36)

## 2017-12-30 LAB — I-STAT TROPONIN, ED: Troponin i, poc: 0.01 ng/mL (ref 0.00–0.08)

## 2017-12-30 MED ORDER — SODIUM CHLORIDE 0.9 % IV SOLN
INTRAVENOUS | Status: AC
  Administered 2017-12-30 (×2): via INTRAVENOUS

## 2017-12-30 MED ORDER — ONDANSETRON HCL 4 MG/2ML IJ SOLN: 4.0000 mg | Freq: Four times a day (QID) | INTRAMUSCULAR | Status: DC | PRN

## 2017-12-30 MED ORDER — ACETAMINOPHEN 650 MG RE SUPP: 650.0000 mg | Freq: Four times a day (QID) | RECTAL | Status: DC | PRN

## 2017-12-30 MED ORDER — SODIUM CHLORIDE 0.9 % IV BOLUS
1000.0000 mL | Freq: Once | INTRAVENOUS | Status: AC
  Administered 2017-12-30: 1000 mL via INTRAVENOUS

## 2017-12-30 MED ORDER — ENOXAPARIN SODIUM 30 MG/0.3ML ~~LOC~~ SOLN
30.0000 mg | SUBCUTANEOUS | Status: DC
  Administered 2017-12-30 – 2018-01-02 (×4): 30 mg via SUBCUTANEOUS
  Filled 2017-12-30 (×4): qty 0.3

## 2017-12-30 MED ORDER — SODIUM CHLORIDE 0.9% FLUSH
3.0000 mL | Freq: Two times a day (BID) | INTRAVENOUS | Status: DC
  Administered 2017-12-30 – 2018-01-01 (×3): 3 mL via INTRAVENOUS

## 2017-12-30 MED ORDER — ACETAMINOPHEN 325 MG PO TABS
650.0000 mg | ORAL_TABLET | Freq: Four times a day (QID) | ORAL | Status: DC | PRN
  Administered 2017-12-30: 650 mg via ORAL
  Filled 2017-12-30: qty 2

## 2017-12-30 MED ORDER — ONDANSETRON HCL 4 MG PO TABS: 4.0000 mg | ORAL_TABLET | Freq: Four times a day (QID) | ORAL | Status: DC | PRN

## 2017-12-30 MED ORDER — SODIUM CHLORIDE 0.9 % IV BOLUS (SEPSIS)
500.0000 mL | Freq: Once | INTRAVENOUS | Status: AC
  Administered 2017-12-30: 500 mL via INTRAVENOUS

## 2017-12-30 MED ORDER — INSULIN ASPART 100 UNIT/ML ~~LOC~~ SOLN
0.0000 [IU] | Freq: Every day | SUBCUTANEOUS | Status: DC
  Administered 2017-12-30: 2 [IU] via SUBCUTANEOUS

## 2017-12-30 MED ORDER — VANCOMYCIN HCL IN DEXTROSE 1-5 GM/200ML-% IV SOLN: 1000.0000 mg | Freq: Once | INTRAVENOUS | Status: DC

## 2017-12-30 MED ORDER — INSULIN GLARGINE 100 UNIT/ML ~~LOC~~ SOLN
30.0000 [IU] | Freq: Every day | SUBCUTANEOUS | Status: DC
Start: 2017-12-30 — End: 2018-01-02
  Administered 2017-12-30 – 2018-01-01 (×3): 30 [IU] via SUBCUTANEOUS
  Filled 2017-12-30 (×4): qty 0.3

## 2017-12-30 MED ORDER — IBUPROFEN 600 MG PO TABS: 600.0000 mg | ORAL_TABLET | ORAL | Status: DC | PRN

## 2017-12-30 MED ORDER — INSULIN ASPART 100 UNIT/ML ~~LOC~~ SOLN
0.0000 [IU] | Freq: Three times a day (TID) | SUBCUTANEOUS | Status: DC
  Administered 2017-12-30 (×2): 11 [IU] via SUBCUTANEOUS
  Administered 2017-12-31 (×2): 5 [IU] via SUBCUTANEOUS
  Administered 2017-12-31: 8 [IU] via SUBCUTANEOUS
  Administered 2018-01-01: 5 [IU] via SUBCUTANEOUS
  Administered 2018-01-01 – 2018-01-02 (×3): 3 [IU] via SUBCUTANEOUS
  Administered 2018-01-02: 5 [IU] via SUBCUTANEOUS

## 2017-12-30 MED ORDER — VANCOMYCIN VARIABLE DOSE PER UNSTABLE RENAL FUNCTION (PHARMACIST DOSING): Status: DC

## 2017-12-30 MED ORDER — INSULIN ASPART 100 UNIT/ML ~~LOC~~ SOLN
10.0000 [IU] | Freq: Once | SUBCUTANEOUS | Status: AC
  Administered 2017-12-30: 10 [IU] via INTRAVENOUS
  Filled 2017-12-30: qty 1

## 2017-12-30 MED ORDER — SODIUM CHLORIDE 0.9 % IV BOLUS
500.0000 mL | Freq: Once | INTRAVENOUS | Status: AC
  Administered 2017-12-30: 500 mL via INTRAVENOUS

## 2017-12-30 MED ORDER — ACETAMINOPHEN 325 MG PO TABS
650.0000 mg | ORAL_TABLET | Freq: Once | ORAL | Status: AC
  Administered 2017-12-30: 650 mg via ORAL
  Filled 2017-12-30: qty 2

## 2017-12-30 MED ORDER — ATORVASTATIN CALCIUM 40 MG PO TABS
40.0000 mg | ORAL_TABLET | Freq: Every day | ORAL | Status: DC
  Administered 2017-12-30 – 2018-01-03 (×5): 40 mg via ORAL
  Filled 2017-12-30 (×5): qty 1

## 2017-12-30 MED ORDER — SODIUM CHLORIDE 0.9 % IV SOLN
2.0000 g | INTRAVENOUS | Status: DC
  Administered 2017-12-30 – 2018-01-01 (×3): 2 g via INTRAVENOUS
  Filled 2017-12-30 (×3): qty 20

## 2017-12-30 MED ORDER — VANCOMYCIN HCL 10 G IV SOLR
2000.0000 mg | Freq: Once | INTRAVENOUS | Status: AC
  Administered 2017-12-30: 2000 mg via INTRAVENOUS
  Filled 2017-12-30: qty 2000

## 2017-12-30 MED ORDER — PIPERACILLIN-TAZOBACTAM 3.375 G IVPB 30 MIN
3.3750 g | Freq: Once | INTRAVENOUS | Status: AC
  Administered 2017-12-30: 3.375 g via INTRAVENOUS
  Filled 2017-12-30: qty 50

## 2017-12-30 MED ORDER — IBUPROFEN 400 MG PO TABS
400.0000 mg | ORAL_TABLET | Freq: Once | ORAL | Status: AC
  Administered 2017-12-30: 400 mg via ORAL
  Filled 2017-12-30: qty 1

## 2017-12-30 MED ORDER — ASPIRIN EC 81 MG PO TBEC
81.0000 mg | DELAYED_RELEASE_TABLET | Freq: Every day | ORAL | Status: DC
  Administered 2017-12-30 – 2018-01-03 (×5): 81 mg via ORAL
  Filled 2017-12-30 (×5): qty 1

## 2017-12-30 MED ORDER — ACETAMINOPHEN 500 MG PO TABS: 1000.0000 mg | ORAL_TABLET | Freq: Four times a day (QID) | ORAL | Status: DC | PRN

## 2017-12-30 MED ORDER — PIPERACILLIN-TAZOBACTAM 3.375 G IVPB
3.3750 g | Freq: Three times a day (TID) | INTRAVENOUS | Status: DC
  Filled 2017-12-30: qty 50

## 2017-12-30 MED ORDER — SODIUM CHLORIDE 0.9 % IV BOLUS (SEPSIS)
1000.0000 mL | Freq: Once | INTRAVENOUS | Status: AC
  Administered 2017-12-30: 1000 mL via INTRAVENOUS

## 2017-12-30 NOTE — H&P (Signed)
History and Physical    Kimberly Frazier MBT:[id number] DOB: 01/02/1961 DOA: 12/30/2017  PCP: Ladell Pier, MD  Patient coming from: Home  Chief Complaint: Confusion  HPI: Kimberly Frazier is a 57 y.o. female with medical history significant of DM2, HLD, HTN, CAD, arthritis who presents for a 2 day history of confusion and dysuria.  The patient is very confused at baseline and tells a meandering story.  Husband at bedside was helpful in timeline.  Apparently, patient developed symptoms about 2 days ago.  She became more lethargic and confused.  She required assistance with iADLs including going to the bathroom which was not normal for her.  She further notes dysuria, back pain, pain in the lower abdomen, pain in her hands when she urinated.  She reports no fever, chills, h/o kidney stones, rash or wounds.  She denies chest pain and SOB or vision changes.  She does note 1 episode of emesis and bloating in her abdomen.  She has had decreased PO intake and has not been taking her medications including insulin and potassium supplementation. There is a telephone note from her PCP which noted that she called in with urinary symptoms on Wednesday 7/31.  Ciprofloxacin was called in for her, but the family was not able to pick it up.   ED Course: In the ED, she was found to initially be hypotensive and tachycardic, but this improved with fluids.  She was initiated on broad spectrum Abx with zosyn and vancomycin.  Blood cultures and urine cultures were sent.  She was found to have an AKI with elevated K.  She was noted to have a UA which was indicative of a UTI.    Review of Systems: As per HPI otherwise 10 point review of systems negative.   Past Medical History:  Diagnosis Date  . Arthritis   . Depression   . Diabetes mellitus   . Hyperlipidemia   . Hypertension     Past Surgical History:  Procedure Laterality Date  . BREAST BIOPSY  2012  . CARDIAC CATHETERIZATION N/A 02/06/2015   Procedure: Left  Heart Cath and Coronary Angiography;  Surgeon: Larey Dresser, MD;  Location: [facility];  Service: Cardiovascular;  Laterality: N/A;   Reviewed with patient.   reports that she has never smoked. She has never used smokeless tobacco. She reports that she drinks alcohol. She reports that she has current or past drug history. Drug: Marijuana.  No Known Allergies    Reviewed with patient.  Family History  Problem Relation Age of Onset  . Heart disease Mother   . Hypertension Mother   . Diabetes Mother   . Cancer Father        hodgkins lymphoma  . Heart disease Sister        heart attack  . Diabetes Sister   . Cancer Other        parent  . Diabetes Other        parent  . Heart disease Other        parent  . Hyperlipidemia Other        parent  . Hypertension Other        parent  . Arthritis Other        parent  . Diabetes Maternal Aunt   . Diabetes Maternal Uncle   . Breast cancer Neg Hx    She reports she has not been taking her medications including her insulin.  Prior to Admission medications  Medication Sig Start Date End Date Taking? Authorizing Provider  aspirin EC 81 MG tablet Take 1 tablet (81 mg total) daily by mouth. 04/18/17  Yes Ladell Pier, MD  atorvastatin (LIPITOR) 40 MG tablet Take 1 tablet (40 mg total) daily by mouth. 04/18/17  Yes Ladell Pier, MD  carvedilol (COREG) 25 MG tablet Take 1 tablet (25 mg total) by mouth 2 (two) times daily. *Patient is overdue for an appt. Please call and schedule for further refills* 06/07/17  Yes Ladell Pier, MD  Insulin Glargine (LANTUS SOLOSTAR) 100 UNIT/ML Solostar Pen Inject 80 Units into the skin at bedtime. 12/06/17  Yes Ladell Pier, MD  losartan (COZAAR) 25 MG tablet Take 1 tablet (25 mg total) by mouth daily. 12/06/17  Yes Ladell Pier, MD  nitroGLYCERIN (NITROSTAT) 0.4 MG SL tablet Place 1 tab under tongue for chest pain.  May repeat after 5 minutes x 2.  DO NOT TAKE MORE THAN 3 TABS  DURING AN EPISODE OF CHEST PAIN 04/18/17  Yes Ladell Pier, MD  potassium chloride (KLOR-CON) 8 MEQ tablet Take 2 tablets (16 mEq total) by mouth daily. 12/06/17  Yes Ladell Pier, MD  BD PEN NEEDLE NANO U/F 32G X 4 MM MISC USE TWICE DAILY AS DIRECTED 05/18/16   Hoyt Koch, MD  Blood Glucose Monitoring Suppl (TRUE METRIX METER) w/Device KIT Use as directed 06/07/17   Ladell Pier, MD  ciprofloxacin (CIPRO) 500 MG tablet Take 1 tablet (500 mg total) by mouth 2 (two) times daily. 12/28/17   Ladell Pier, MD  glucose blood (FREESTYLE LITE) test strip Use as instructed 07/16/14   Hoyt Koch, MD  glucose blood (TRUE METRIX BLOOD GLUCOSE TEST) test strip Use as instructed 06/07/17   Ladell Pier, MD  insulin aspart (NOVOLOG FLEXPEN) 100 UNIT/ML FlexPen Inject 25 Units into the skin 3 (three) times daily with meals. 06/07/17   Ladell Pier, MD  Insulin Pen Needle (ULTICARE MICRO PEN NEEDLES) 32G X 4 MM MISC Use as directed with insulin 11/07/17   Ladell Pier, MD  meloxicam (MOBIC) 15 MG tablet TAKE 1 TABLET BY MOUTH DAILY Patient not taking: Reported on 06/16/2017 02/04/17   Wallene Huh, DPM  TRUEPLUS LANCETS 28G MISC Use as directted 06/07/17   Ladell Pier, MD    Physical Exam:  Constitutional: NAD, calm, comfortable Vitals:   12/30/17 1015 12/30/17 1045 12/30/17 1100 12/30/17 1115  BP: 115/76 (!) 106/58 116/60 105/60  Pulse: (!) 101 96 96 99  Resp: (!) 22 (!) 31 (!) 28 (!) 22  Temp:      TempSrc:      SpO2: 100% 98% 92% 98%  Weight:      Height:       Eyes: PERRL, lids and conjunctivae normal ENMT: Mucous membranes are dry.Normal dentition.  Neck: normal, supple, no masses, no thyromegaly Respiratory: breathing comfortably, clear to auscultation, no wheezing Cardiovascular: Tachycardic, regular, no murmurs noted Abdomen: + BS, tender to palpation over the lower abdomen, no CVA tenderness Musculoskeletal: No joint deformity upper  and lower extremities.  Normal muscle tone.  Skin: no rashes, lesions, ulcers on exposed skin Neurologic: CN 2-12 grossly intact. Sensation intact.  Strength 5/5 in all 4.  Psychiatric: She is easily confused.  Grossly oriented and able to answer all questions, but recent memory is blurred and she repeated questions to me multiple times.  GU: She has a pure wick, but out of place,  she has no vulvar skin lesions, labia are normal appearing with no rash/wound and no vaginal prolapse noted on my exam today.   Labs on Admission: I have personally reviewed following labs and imaging studies  CBC: Recent Labs  Lab 12/30/17 0812 12/30/17 0824  WBC 21.0*  --   NEUTROABS 18.4*  --   HGB 14.1 15.0  HCT 41.5 44.0  MCV 87.0  --   PLT 186  --    Basic Metabolic Panel: Recent Labs  Lab 12/30/17 0812 12/30/17 0824  NA 130* 131*  K 5.2* 5.7*  CL 94* 96*  CO2 22  --   GLUCOSE 420* 419*  BUN 41* 67*  CREATININE 2.89* 2.80*  CALCIUM 8.6*  --    GFR: Estimated Creatinine Clearance: 26.5 mL/min (A) (by C-G formula based on SCr of 2.8 mg/dL (H)). Liver Function Tests: Recent Labs  Lab 12/30/17 0812  AST 38  ALT 18  ALKPHOS 116  BILITOT 3.4*  PROT 6.7  ALBUMIN 2.8*   No results for input(s): LIPASE, AMYLASE in the last 168 hours. No results for input(s): AMMONIA in the last 168 hours. Coagulation Profile: Recent Labs  Lab 12/30/17 0923  INR 1.22   Cardiac Enzymes: No results for input(s): CKTOTAL, CKMB, CKMBINDEX, TROPONINI in the last 168 hours. BNP (last 3 results) No results for input(s): PROBNP in the last 8760 hours. HbA1C: No results for input(s): HGBA1C in the last 72 hours. CBG: Recent Labs  Lab 12/30/17 0801  GLUCAP 447*   Lipid Profile: No results for input(s): CHOL, HDL, LDLCALC, TRIG, CHOLHDL, LDLDIRECT in the last 72 hours. Thyroid Function Tests: No results for input(s): TSH, T4TOTAL, FREET4, T3FREE, THYROIDAB in the last 72 hours. Anemia Panel: No  results for input(s): VITAMINB12, FOLATE, FERRITIN, TIBC, IRON, RETICCTPCT in the last 72 hours. Urine analysis:    Component Value Date/Time   COLORURINE YELLOW 12/30/2017 0844   APPEARANCEUR TURBID (A) 12/30/2017 0844   LABSPEC 1.025 12/30/2017 0844   PHURINE 5.5 12/30/2017 0844   GLUCOSEU 100 (A) 12/30/2017 0844   HGBUR LARGE (A) 12/30/2017 0844   BILIRUBINUR MODERATE (A) 12/30/2017 0844   KETONESUR 15 (A) 12/30/2017 0844   PROTEINUR >300 (A) 12/30/2017 0844   NITRITE POSITIVE (A) 12/30/2017 0844   LEUKOCYTESUR LARGE (A) 12/30/2017 0844    Radiological Exams on Admission: Ct Head Wo Contrast  Result Date: 12/30/2017 CLINICAL DATA:  Increased weakness and decreased appetite. Disoriented. EXAM: CT HEAD WITHOUT CONTRAST TECHNIQUE: Contiguous axial images were obtained from the base of the skull through the vertex without intravenous contrast. COMPARISON:  02/19/2016; 06/16/2014 FINDINGS: Brain: Mild atrophy with volume loss and prominence of the bifrontal extra-axial spaces. Gray-white differentiation is maintained. No CT evidence of acute large territory infarct. No intraparenchymal or extra-axial mass or hemorrhage. Normal size and configuration of the ventricles and the basilar cisterns. No midline shift. Vascular: No hyperdense vessel or unexpected calcification. Skull: No displaced calvarial fracture Sinuses/Orbits: Polypoid mucosal thickening of the left maxillary sinus. The remaining paranasal sinuses and mastoid air cells are normally aerated. No air-fluid levels. Other: Regional soft tissues appear normal. IMPRESSION: Similar findings of mild atrophy without superimposed acute intracranial process. Electronically Signed   By: Sandi Mariscal M.D.   On: 12/30/2017 08:51   Dg Chest Port 1 View  Result Date: 12/30/2017 CLINICAL DATA:  Leukocytosis. EXAM: PORTABLE CHEST 1 VIEW COMPARISON:  None. FINDINGS: Cardiac enlargement without heart failure. Negative for pneumonia or effusion. Lungs are  clear. IMPRESSION: Cardiac enlargement  without acute abnormality. Electronically Signed   By: Franchot Gallo M.D.   On: 12/30/2017 08:56    EKG: Independently reviewed. Sinus tachycardia, no peaking of T waves  Assessment/Plan Sepsis due to UTI (urinary tract infection) - Patient has stable vital signs, mildly confused, with appropriate Abx can be monitored on telemetry - UA consistent with UTI - UC pending along with blood cultures - De escalate abx to Rocephin, follow culture - IVF with NS at 125/hr for 10 hours, monitor BP - Tylenol for fever - Zofran for nausea - Trend lactate - If not improving as expected, consider CT or [place] for pyelo evaluation and broaden Abx coverage  AKI with hyperkalemia - Receiving insulin and fluids, repeat BMET at 1400.  EKG without peaking of t waves - IVF with NS at 125/hr - Trend renal function - Treat sepsis and UTI as above  Diabetes mellitus type 2, uncontrolled, with hyperglycemia - She has been unable to take her insulin, lantus 80 qhs and short acting 25 units with meals - BG is > 400, bicarb 22  - Monitor for HHS or DKA  - Lantus 30 units with mod sliding scale, clear liquid diet if she is able to tolerate    Essential hypertension - BP has been low, hold antihypertensives (Carvedilol, losartan) in the acute setting.  Restart as patient recovers    Hyperlipidemia - Continue atorvastatin    Severe obesity (BMI >= 40) - Follow up outpatient    H/O CAD (coronary artery disease) - Monitor for chest pain, nitro as needed     DVT prophylaxis: Lovenox, renally dosed Code Status: Full, confirmed with husband at bedside Family Communication: Husband at bedside Disposition Plan: Admit for treatment of UTI and sepsis, anticipate 2-3 day hospitalization at this time given renal function Consults called: None Admission status: Inpatient, telemetry   Gilles Chiquito MD [facility] Pager [phone]  If 7PM-7AM, please contact  night-coverage [email or web address] Password [email or web address]  12/30/2017, 11:20 AM

## 2017-12-30 NOTE — Progress Notes (Addendum)
CRITICAL VALUE ALERT  Critical Value:  Lactic acid 2.0  Date & Time Notied:  12/30/2017 1953  Provider Notified: Night coverage  Orders Received/Actions taken: Continuing to monitor. Orders received to administer a 500 mL saline bolus and repeat lactic acid at 2330. Repeat lactic acid 1.1. Continue to monitor.   Bartholomew Crews, RN

## 2017-12-30 NOTE — ED Provider Notes (Addendum)
[facility] EMERGENC[facility] DEPARTMENT Provider Note   CSN: [id number] Arrival date & time: 12/30/17  0754     History   Chief Complaint Chief Complaint  Patient presents with  . Weakness    HPI Simi Briel is a 57 y.o. female.  HPI  57 year old female with history of diabetes presents today with daughter who states that patient is generally weaker and confused from baseline.  She feels weakness may be more pronounced on the right patient lives with her brother.  There is reported that symptoms have been going on for 3 days and worsening.  Daughter was not alerted until today and brings her mother in for evaluation.  Patient states that she is generally weak but feels there is some more weakness on the right side.  Is having some difficulty with word finding and speaking.  She denies any vision changes.  She denies any other recent illness such as fever, chills, shortness of breath, chest pain, or recent medication changes.  She denies being on blood thinners and denies any recent injury.  She has had decreased p.o. intake due to poor appetite.  She reports that her blood sugars have been running normally.  Past Medical History:  Diagnosis Date  . Arthritis   . Depression   . Diabetes mellitus   . Hyperlipidemia   . Hypertension     Patient Active Problem List   Diagnosis Date Noted  . Diabetic peripheral neuropathy ([place]) 09/03/2016  . Headache 01/19/2016  . CAD (coronary artery disease) 04/10/2015  . Neck pain on right side 06/20/2014  . Severe obesity (BMI >= 40) ([place]) 09/20/2013  . 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 Hyperlipidemia 10/25/2012  . Diabetes mellitus type 2, uncontrolled, with complications ([place]) 01/60/1093  . Essential hypertension 07/26/2012    Past Surgical History:  Procedure Laterality Date  . BREAST BIOPSY  2012  . CARDIAC CATHETERIZATION N/A 02/06/2015   Procedure: Left Heart Cath and Coronary Angiography;  Surgeon: Larey Dresser,  MD;  Location: [facility];  Service: Cardiovascular;  Laterality: N/A;     OB History    Gravida  4   Para      Term      Preterm      AB  2   Living  2     SAB      TAB  2   Ectopic      Multiple      Live Births  2            Home Medications    Prior to Admission medications   Medication Sig Start Date End Date Taking? Authorizing Provider  aspirin EC 81 MG tablet Take 1 tablet (81 mg total) daily by mouth. 04/18/17   Ladell Pier, MD  atorvastatin (LIPITOR) 40 MG tablet Take 1 tablet (40 mg total) daily by mouth. 04/18/17   Ladell Pier, MD  BD PEN NEEDLE NANO U/F 32G X 4 MM MISC USE TWICE DAILY AS DIRECTED 05/18/16   Hoyt Koch, MD  Blood Glucose Monitoring Suppl (TRUE METRIX METER) w/Device KIT Use as directed 06/07/17   Ladell Pier, MD  carvedilol (COREG) 25 MG tablet Take 1 tablet (25 mg total) by mouth 2 (two) times daily. *Patient is overdue for an appt. Please call and schedule for further refills* 06/07/17   Ladell Pier, MD  ciprofloxacin (CIPRO) 500 MG tablet Take 1 tablet (500 mg total) by mouth 2 (two) times  daily. 12/28/17   Ladell Pier, MD  glucose blood (FREESTYLE LITE) test strip Use as instructed 07/16/14   Hoyt Koch, MD  glucose blood (TRUE METRIX BLOOD GLUCOSE TEST) test strip Use as instructed 06/07/17   Ladell Pier, MD  insulin aspart (NOVOLOG FLEXPEN) 100 UNIT/ML FlexPen Inject 25 Units into the skin 3 (three) times daily with meals. 06/07/17   Ladell Pier, MD  Insulin Glargine (LANTUS SOLOSTAR) 100 UNIT/ML Solostar Pen Inject 80 Units into the skin at bedtime. 12/06/17   Ladell Pier, MD  Insulin Pen Needle (ULTICARE MICRO PEN NEEDLES) 32G X 4 MM MISC Use as directed with insulin 11/07/17   Ladell Pier, MD  losartan (COZAAR) 25 MG tablet Take 1 tablet (25 mg total) by mouth daily. 12/06/17   Ladell Pier, MD  meloxicam (MOBIC) 15 MG tablet TAKE 1 TABLET BY MOUTH  DAILY Patient not taking: Reported on 06/16/2017 02/04/17   Wallene Huh, DPM  nitroGLYCERIN (NITROSTAT) 0.4 MG SL tablet Place 1 tab under tongue for chest pain.  May repeat after 5 minutes x 2.  DO NOT TAKE MORE THAN 3 TABS DURING AN EPISODE OF CHEST PAIN 04/18/17   Ladell Pier, MD  potassium chloride (KLOR-CON) 8 MEQ tablet Take 2 tablets (16 mEq total) by mouth daily. 12/06/17   Ladell Pier, MD  TRUEPLUS LANCETS 28G MISC Use as directted 06/07/17   Ladell Pier, MD    Family History Family History  Problem Relation Age of Onset  . Heart disease Mother   . Hypertension Mother   . Diabetes Mother   . Cancer Father        hodgkins lymphoma  . Heart disease Sister        heart attack  . Diabetes Sister   . Cancer Other        parent  . Diabetes Other        parent  . Heart disease Other        parent  . Hyperlipidemia Other        parent  . Hypertension Other        parent  . Arthritis Other        parent  . Diabetes Maternal Aunt   . Diabetes Maternal Uncle   . Breast cancer Neg Hx     Social History Social History   Tobacco Use  . Smoking status: Never Smoker  . Smokeless tobacco: Never Used  Substance Use Topics  . Alcohol use: Yes    Comment: occasional  . Drug use: Yes    Types: Marijuana     Allergies   Patient has no known allergies.   Review of Systems Review of Systems  All other systems reviewed and are negative.    Physical Exam Updated Vital Signs Ht 1.549 m (5' 1" )   Wt 122.5 kg (270 lb)   BMI 51.02 kg/m   Physical Exam  Constitutional: She is oriented to person, place, and time. She appears well-developed and well-nourished. No distress.  HENT:  Head: Normocephalic and atraumatic.  Right Ear: External ear normal.  Left Ear: External ear normal.  Nose: Nose normal.  Mouth/Throat: Oropharynx is clear and moist.  Eyes: Pupils are equal, round, and reactive to light. Conjunctivae and EOM are normal.  Neck: Normal  range of motion. Neck supple.  Cardiovascular: Tachycardia present.  Pulmonary/Chest: Effort normal and breath sounds normal.  Abdominal: Soft. Bowel sounds are normal.  Musculoskeletal:  Normal range of motion.  Neurological: She is alert and oriented to person, place, and time.  Skin: Skin is warm and dry. Capillary refill takes less than 2 seconds.  Psychiatric: She has a normal mood and affect.  Nursing note and vitals reviewed.    ED Treatments / Results  Labs (all labs ordered are listed, but only abnormal results are displayed) Labs Reviewed  CBC - Abnormal; Notable for the following components:      Result Value   WBC 21.0 (*)    All other components within normal limits  DIFFERENTIAL - Abnormal; Notable for the following components:   Neutro Abs 18.4 (*)    Monocytes Absolute 1.3 (*)    Abs Immature Granulocytes 0.2 (*)    All other components within normal limits  I-STAT CHEM 8, ED - Abnormal; Notable for the following components:   Sodium 131 (*)    Potassium 5.7 (*)    Chloride 96 (*)    BUN 67 (*)    Creatinine, Ser 2.80 (*)    Glucose, Bld 419 (*)    Calcium, Ion 1.03 (*)    All other components within normal limits  PROTIME-INR  APTT  COMPREHENSIVE METABOLIC PANEL  I-STAT TROPONIN, ED  CBG MONITORING, ED  I-STAT BETA HCG BLOOD, ED ([facility], [facility], [facility] ONLY)    EKG EKG Interpretation  Date/Time:  Friday December 30 2017 08:09:57 EDT Ventricular Rate:  118 PR Interval:  140 QRS Duration: 72 QT Interval:  326 QTC Calculation: 456 R Axis:   -60 Text Interpretation:  Sinus tachycardia Possible Left atrial enlargement Left axis deviation Left ventricular hypertrophy Inferior infarct , age undetermined Abnormal ECG Confirmed by Pattricia Boss [id number]) on 12/30/2017 8:32:42 AM   Radiology Ct Head Wo Contrast  Result Date: 12/30/2017 CLINICAL DATA:  Increased weakness and decreased appetite. Disoriented. EXAM: CT HEAD WITHOUT CONTRAST TECHNIQUE: Contiguous axial images  were obtained from the base of the skull through the vertex without intravenous contrast. COMPARISON:  02/19/2016; 06/16/2014 FINDINGS: Brain: Mild atrophy with volume loss and prominence of the bifrontal extra-axial spaces. Gray-white differentiation is maintained. No CT evidence of acute large territory infarct. No intraparenchymal or extra-axial mass or hemorrhage. Normal size and configuration of the ventricles and the basilar cisterns. No midline shift. Vascular: No hyperdense vessel or unexpected calcification. Skull: No displaced calvarial fracture Sinuses/Orbits: Polypoid mucosal thickening of the left maxillary sinus. The remaining paranasal sinuses and mastoid air cells are normally aerated. No air-fluid levels. Other: Regional soft tissues appear normal. IMPRESSION: Similar findings of mild atrophy without superimposed acute intracranial process. Electronically Signed   By: Sandi Mariscal M.D.   On: 12/30/2017 08:51   Dg Chest Port 1 View  Result Date: 12/30/2017 CLINICAL DATA:  Leukocytosis. EXAM: PORTABLE CHEST 1 VIEW COMPARISON:  None. FINDINGS: Cardiac enlargement without heart failure. Negative for pneumonia or effusion. Lungs are clear. IMPRESSION: Cardiac enlargement without acute abnormality. Electronically Signed   By: Franchot Gallo M.D.   On: 12/30/2017 08:56    Procedures Procedures (including critical care time)  Medications Ordered in ED Medications - No data to display   Initial Impression / Assessment and Plan / ED Course  I have reviewed the triage vital signs and the nursing notes.  Pertinent labs & imaging results that were available during my care of the ewere reviewed by me and considered in my medical decision making (see chart for details).  Clinical Course as of Dec 30 1008  Fri Dec 30, 2017  1008 Bacteria, UA(!): MANY [DR]  1008 Nitrite(!): POSITIVE [DR]  1008 Suspect urine as source of infection- will culture  Leukocytes, UA(!): LARGE [DR]    Clinical Course  User Index [DR] Pattricia Boss, MD    Patient remains tachycardiac BP improved NS infusing ABX infused 57 year old female history of type 2 diabetes, hypertension, hyperlipidemia presents today with generalized weakness and poor appetite for several days.  Here she is found to have a temp to 102.  She has been cultured, labs obtained, chest x-Donae Kueker, and urinalysis for evaluation for infection.  Likely source appears to be urine with large leukocytes, positive nitrites, although urinalysis been reports that white blood cell field was obscured due to less than 10 mL's of urine.  X-Lurene Robley does not show any evidence of acute infiltrate. 1-Sepsis- as above- elevated lactic acid, fever, leukocytosis, aki, tachycardia, soft bp  Additionally-  2-Hyperglycemia- likely due to acute infectious process.  IV fluids being given and will give insulin here. No evidence of dka-anion gap 12, doubt dka 3-Hyperkalemia noted- will hydrate and insulin, no evidence of cardiac instability  4-aki- likely secondary to volume depletion, and sepsis  Discussed with Dr. Daryll Drown and will see for admission   CRITICAL CARE Performed by: Pattricia Boss Total critical care time: 45 minutes Critical care time was exclusive of separately billable procedures and treating other patients. Critical care was necessary to treat or prevent imminent or life-threatening deterioration. Critical care was time spent personally by me on the following activities: development of treatment plan with patient and/or surrogate as well as nursing, discussions with consultants, evaluation of patient's response to treatment, examination of patient, obtaining history from patient or surrogate, ordering and performing treatments and interventions, ordering and review of laboratory studies, ordering and review of radiographic studies, pulse oximetry and re-evaluation of patient's condition.  Final Clinical Impressions(s) / ED Diagnoses   Final diagnoses:    Sepsis, due to unspecified organism [facility])  Urinary tract infection without hematuria, site unspecified  Hyperkalemia  Hyperglycemia  AKI (acute kidney injury) [facility])    ED Discharge Orders    None       Pattricia Boss, MD 12/30/17 1030    Pattricia Boss, MD 12/30/17 1031

## 2017-12-30 NOTE — Plan of Care (Signed)
Lactic acid 2.0. Orders received for 500 mL bolus. Will repeat lab. Continue to monitor. Bartholomew Crews, RN

## 2017-12-30 NOTE — ED Notes (Signed)
Patient transported to CT with RN 

## 2017-12-30 NOTE — Progress Notes (Signed)
Patient became lethargic and confused, v/s and CBG  Taken. Patient had temp of 103.1, BP 149/83 and pulse 130. PRN tylenol 650 given and MD paged, no new orders.  Temp rechecked, 104.5 rectally, MD paged and orders received for ibuprofen.  Rechecked and patients temp is 100.3, patient is still drowsy but oriented x4

## 2017-12-30 NOTE — Progress Notes (Signed)
Admitted to 14m19, alert and oriented, family at the bedside, on telebox #[phone]

## 2017-12-30 NOTE — Progress Notes (Signed)
Pharmacy Antibiotic Note  Kimberly Frazier is a 57 y.o. female admitted on 12/30/2017 with sepsis.  Pharmacy has been consulted for vancomycin and zosyn dosing. Pt with Tmax 102.3 and WBC is elevated at 21. Scr is well above baseline at 2.89. Lactic acid is >2.   Plan: Vancomycin 2gm IV x 1 then will trend Scr for further doses Zosyn 3.375gm IV Q8H (4 hr inf) F/u renal fxn, C&S, clinical status and trough at SS  Height: 5\' 1"  (154.9 cm) Weight: 270 lb (122.5 kg) IBW/kg (Calculated) : 47.8  Temp (24hrs), Avg:101.1 F (38.4 C), Min:99.8 F (37.7 C), Max:102.3 F (39.1 C)  Recent Labs  Lab 12/30/17 0812 12/30/17 0824 12/30/17 0843  WBC 21.0*  --   --   CREATININE  --  2.80*  --   LATICACIDVEN  --   --  2.88*    Estimated Creatinine Clearance: 26.5 mL/min (A) (by C-G formula based on SCr of 2.8 mg/dL (H)).    No Known Allergies  Antimicrobials this admission: Vanc 8/2>> Zosyn 8/2>>  Dose adjustments this admission: N/A  Microbiology results: Pending  Thank you for allowing pharmacy to be a part of this patient's care.  Kimberly Frazier, Rande Lawman 12/30/2017 9:03 AM

## 2017-12-30 NOTE — ED Triage Notes (Signed)
Pt arrives via EMS with reports of 2 days of increased weakness and decreased appetite, Pt disorientated to year/time which is abnormal. Pt reports being complaint with diabetic medications.

## 2017-12-31 ENCOUNTER — Inpatient Hospital Stay (HOSPITAL_COMMUNITY): Payer: Self-pay

## 2017-12-31 ENCOUNTER — Encounter (HOSPITAL_COMMUNITY): Payer: Self-pay

## 2017-12-31 LAB — BLOOD CULTURE ID PANEL (REFLEXED)
ACINETOBACTER BAUMANNII: NOT DETECTED
CANDIDA ALBICANS: NOT DETECTED
CANDIDA GLABRATA: NOT DETECTED
CANDIDA PARAPSILOSIS: NOT DETECTED
CANDIDA TROPICALIS: NOT DETECTED
Candida krusei: NOT DETECTED
Carbapenem resistance: NOT DETECTED
ENTEROBACTER CLOACAE COMPLEX: NOT DETECTED
ENTEROBACTERIACEAE SPECIES: DETECTED — AB
ESCHERICHIA COLI: DETECTED — AB
Enterococcus species: NOT DETECTED
HAEMOPHILUS INFLUENZAE: NOT DETECTED
KLEBSIELLA OXYTOCA: NOT DETECTED
KLEBSIELLA PNEUMONIAE: NOT DETECTED
Listeria monocytogenes: NOT DETECTED
Neisseria meningitidis: NOT DETECTED
PSEUDOMONAS AERUGINOSA: NOT DETECTED
Proteus species: NOT DETECTED
STREPTOCOCCUS PYOGENES: NOT DETECTED
STREPTOCOCCUS SPECIES: NOT DETECTED
Serratia marcescens: NOT DETECTED
Staphylococcus aureus (BCID): NOT DETECTED
Staphylococcus species: NOT DETECTED
Streptococcus agalactiae: NOT DETECTED
Streptococcus pneumoniae: NOT DETECTED

## 2017-12-31 LAB — BASIC METABOLIC PANEL
ANION GAP: 14 (ref 5–15)
BUN: 44 mg/dL — ABNORMAL HIGH (ref 6–20)
CHLORIDE: 104 mmol/L (ref 98–111)
CO2: 20 mmol/L — AB (ref 22–32)
CREATININE: 2.84 mg/dL — AB (ref 0.44–1.00)
Calcium: 7.9 mg/dL — ABNORMAL LOW (ref 8.9–10.3)
GFR calc Af Amer: 20 mL/min — ABNORMAL LOW (ref >60)
GFR calc non Af Amer: 17 mL/min — ABNORMAL LOW (ref >60)
Glucose, Bld: 241 mg/dL — ABNORMAL HIGH (ref 70–99)
POTASSIUM: 3.1 mmol/L — AB (ref 3.5–5.1)
Sodium: 138 mmol/L (ref 135–145)

## 2017-12-31 LAB — CBC
HEMATOCRIT: 36.6 % (ref 36.0–46.0)
HEMOGLOBIN: 12.3 g/dL (ref 12.0–15.0)
MCH: 29.6 pg (ref 26.0–34.0)
MCHC: 33.6 g/dL (ref 30.0–36.0)
MCV: 88 fL (ref 78.0–100.0)
Platelets: 115 10*3/uL — ABNORMAL LOW (ref 150–400)
RBC: 4.16 MIL/uL (ref 3.87–5.11)
RDW: 13.9 % (ref 11.5–15.5)
WBC: 16.3 10*3/uL — AB (ref 4.0–10.5)

## 2017-12-31 LAB — GLUCOSE, CAPILLARY
GLUCOSE-CAPILLARY: 204 mg/dL — AB (ref 70–99)
GLUCOSE-CAPILLARY: 214 mg/dL — AB (ref 70–99)
GLUCOSE-CAPILLARY: 278 mg/dL — AB (ref 70–99)
Glucose-Capillary: 179 mg/dL — ABNORMAL HIGH (ref 70–99)

## 2017-12-31 LAB — SODIUM, URINE, RANDOM: Sodium, Ur: 19 mmol/L

## 2017-12-31 LAB — CREATININE, URINE, RANDOM: Creatinine, Urine: 109.43 mg/dL

## 2017-12-31 LAB — LACTIC ACID, PLASMA: Lactic Acid, Venous: 1.1 mmol/L (ref 0.5–1.9)

## 2017-12-31 MED ORDER — POTASSIUM CHLORIDE IN NACL 20-0.9 MEQ/L-% IV SOLN
INTRAVENOUS | Status: DC
  Administered 2017-12-31 – 2018-01-02 (×5): via INTRAVENOUS
  Filled 2017-12-31 (×7): qty 1000

## 2017-12-31 MED ORDER — POTASSIUM CHLORIDE IN NACL 40-0.9 MEQ/L-% IV SOLN
INTRAVENOUS | Status: DC
  Administered 2017-12-31: 75 mL/h via INTRAVENOUS
  Filled 2017-12-31: qty 1000

## 2017-12-31 NOTE — Progress Notes (Signed)
PHARMACY - PHYSICIAN COMMUNICATION CRITICAL VALUE ALERT - BLOOD CULTURE IDENTIFICATION (BCID)  Kimberly Frazier is an 57 y.o. female who presented to [facility] on 12/30/2017 with a chief complaint of AMS/AKI and UTI  Assessment: 2/4 Blood cultures growing E. Coli  Name of physician (or Provider) Contacted: N/A  Current antibiotics: Rocephin 2 g IV q24h  Changes to prescribed antibiotics recommended:   None--continue as ordered  Results for orders placed or performed during the hospital encounter of 12/30/17  Blood Culture ID Panel (Reflexed) (Collected: 12/30/2017  9:13 AM)  Result Value Ref Range   Enterococcus species NOT DETECTED NOT DETECTED   Listeria monocytogenes NOT DETECTED NOT DETECTED   Staphylococcus species NOT DETECTED NOT DETECTED   Staphylococcus aureus NOT DETECTED NOT DETECTED   Streptococcus species NOT DETECTED NOT DETECTED   Streptococcus agalactiae NOT DETECTED NOT DETECTED   Streptococcus pneumoniae NOT DETECTED NOT DETECTED   Streptococcus pyogenes NOT DETECTED NOT DETECTED   Acinetobacter baumannii NOT DETECTED NOT DETECTED   Enterobacteriaceae species DETECTED (A) NOT DETECTED   Enterobacter cloacae complex NOT DETECTED NOT DETECTED   Escherichia coli DETECTED (A) NOT DETECTED   Klebsiella oxytoca NOT DETECTED NOT DETECTED   Klebsiella pneumoniae NOT DETECTED NOT DETECTED   Proteus species NOT DETECTED NOT DETECTED   Serratia marcescens NOT DETECTED NOT DETECTED   Carbapenem resistance NOT DETECTED NOT DETECTED   Haemophilus influenzae NOT DETECTED NOT DETECTED   Neisseria meningitidis NOT DETECTED NOT DETECTED   Pseudomonas aeruginosa NOT DETECTED NOT DETECTED   Candida albicans NOT DETECTED NOT DETECTED   Candida glabrata NOT DETECTED NOT DETECTED   Candida krusei NOT DETECTED NOT DETECTED   Candida parapsilosis NOT DETECTED NOT DETECTED   Candida tropicalis NOT DETECTED NOT DETECTED    Caryl Pina 12/31/2017  2:03 AM

## 2017-12-31 NOTE — Progress Notes (Signed)
PROGRESS NOTE    Kimberly Frazier  XBM:[id number] DOB: 1961-04-30 DOA: 12/30/2017 PCP: Ladell Pier, MD      Brief Narrative:  Kimberly Frazier is a 57 y.o. F with HTN and DM who presents with 1 day malaise, confusion, shakes.  Found to have acute kidney injury, lactate 2.8, delirium, urinalysis suggestive of UTI.  CT head unremarkable.  Started on broad-spectrum antibiotics.      Assessment & Plan:  Sepsis from E. coli Lactate cleared overnight, given 30 cc/kg IV fluids.  Blood cultures overnight going E. coli in 2 of 2 bottles.  White blood cell count improving. -Continue ceftriaxone -Follow blood cultures for speciation  Acute renal failure Basleine Cr 0.8. So far, renal function worsening. -Check urine electrolytes  -Obtain US renal -IV fluids  Thrombocytopenia Mild, from sepsis. -Trend CBC  Diabetes Hyperglycemic -Continue glargine -Continue SSI  Hypertension Hypotensive at admission. -Hold home carvedilol and losartan -Continue aspirin and statin   Hypokalemia -IV fluids with K, cautiously     DVT prophylaxis: Loveno Code Status: FULL Family Communication: None presnet MDM and disposition Plan: The below labs and imaging reports were reviewed and summarized above.    The patient was admitted with sepsis from E coli bacteremia, found to have renal failure which is currently worsening.   Will contineu fluids and obtain imaging of kidneys then urine electrolytes.   Treate with ceftriaxone, follow culture finality.   Consultants:   None  Procedures:   US renal  Antimicrobials:   Vanc and Zosyn x1  Ceftriaxone 8/2 >>    Subjective: Feeling much better.  Mentation normal.  Weak and tired, but no fever, vomiting, flank pain, hematuria.  Endorses dysuria, urinary irritation for last week.  No abdominal pain.  Objective: Vitals:   12/31/17 0121 12/31/17 0123 12/31/17 0459 12/31/17 0927  BP: 95/61 114/75 105/70 (!) 141/93  Pulse: 87 87 83 94    Resp: (!) 22  19 18   Temp: 97.9 F (36.6 C)  98.9 F (37.2 C) 99.9 F (37.7 C)  TempSrc: Oral   Oral  SpO2: 99%  99% 97%  Weight:      Height:        Intake/Output Summary (Last 24 hours) at 12/31/2017 1432 Last data filed at 12/31/2017 1300 Gross per 24 hour  Intake 2667.84 ml  Output 26 ml  Net 2641.84 ml   Filed Weights   12/30/17 0800 12/30/17 2102  Weight: 122.5 kg (270 lb) 122.5 kg (270 lb 0.1 oz)    Examination: General appearance: Obese adult female, alert and in no acute distress.  Lying in bed, tired, weak. HEENT: Anicteric, conjunctiva pink, lids and lashes normal. No nasal deformity, discharge, epistaxis.  Lips moist, OP moist, no oral lesions, hearing normal..   Skin: Warm and dry.   No suspicious rashes or lesions. Cardiac: RRR, nl S1-S2, no murmurs appreciated.  Capillary refill is brisk.  JVP not visible.  No LE edema.  Radia  pulses 2+ and symmetric. Respiratory: Normal respiratory rate and rhythm.  CTAB without rales or wheezes. Abdomen: Abdomen soft.  no TTP. No ascites, distension, hepatosplenomegaly.   MSK: No deformities or effusions. Neuro: Awake and alert.  EOMI, moves all extremities but appears very globally weak, debilitated, requires assistance to sit up. Speech fluent.    Psych: Sensorium intact and responding to questions, attention normal. Affect normal.  Judgment and insight appear normal.    Data Reviewed: I have personally reviewed following labs and imaging studies:  CBC:  Recent Labs  Lab 12/30/17 0812 12/30/17 0824 12/31/17 0002  WBC 21.0*  --  16.3*  NEUTROABS 18.4*  --   --   HGB 14.1 15.0 12.3  HCT 41.5 44.0 36.6  MCV 87.0  --  88.0  PLT 186  --  970*   Basic Metabolic Panel: Recent Labs  Lab 12/30/17 0812 12/30/17 0824 12/30/17 1358 12/31/17 0002  NA 130* 131* 134* 138  K 5.2* 5.7* 3.2* 3.1*  CL 94* 96* 99 104  CO2 22  --  22 20*  GLUCOSE 420* 419* 345* 241*  BUN 41* 67* 41* 44*  CREATININE 2.89* 2.80* 2.77* 2.84*   CALCIUM 8.6*  --  8.0* 7.9*   GFR: Estimated Creatinine Clearance: 26.2 mL/min (A) (by C-G formula based on SCr of 2.84 mg/dL (H)). Liver Function Tests: Recent Labs  Lab 12/30/17 0812  AST 38  ALT 18  ALKPHOS 116  BILITOT 3.4*  PROT 6.7  ALBUMIN 2.8*   No results for input(s): LIPASE, AMYLASE in the last 168 hours. No results for input(s): AMMONIA in the last 168 hours. Coagulation Profile: Recent Labs  Lab 12/30/17 0923  INR 1.22   Cardiac Enzymes: No results for input(s): CKTOTAL, CKMB, CKMBINDEX, TROPONINI in the last 168 hours. BNP (last 3 results) No results for input(s): PROBNP in the last 8760 hours. HbA1C: No results for input(s): HGBA1C in the last 72 hours. CBG: Recent Labs  Lab 12/30/17 1225 12/30/17 1610 12/30/17 2059 12/31/17 0730 12/31/17 1152  GLUCAP 332* 316* 236* 204* 214*   Lipid Profile: No results for input(s): CHOL, HDL, LDLCALC, TRIG, CHOLHDL, LDLDIRECT in the last 72 hours. Thyroid Function Tests: No results for input(s): TSH, T4TOTAL, FREET4, T3FREE, THYROIDAB in the last 72 hours. Anemia Panel: No results for input(s): VITAMINB12, FOLATE, FERRITIN, TIBC, IRON, RETICCTPCT in the last 72 hours. Urine analysis:    Component Value Date/Time   COLORURINE YELLOW 12/30/2017 0844   APPEARANCEUR TURBID (A) 12/30/2017 0844   LABSPEC 1.025 12/30/2017 0844   PHURINE 5.5 12/30/2017 0844   GLUCOSEU 100 (A) 12/30/2017 0844   HGBUR LARGE (A) 12/30/2017 0844   BILIRUBINUR MODERATE (A) 12/30/2017 0844   KETONESUR 15 (A) 12/30/2017 0844   PROTEINUR >300 (A) 12/30/2017 0844   NITRITE POSITIVE (A) 12/30/2017 0844   LEUKOCYTESUR LARGE (A) 12/30/2017 0844   Sepsis Labs: @LABRCNTIP (procalcitonin:4,lacticacidven:4)  ) Recent Results (from the past 240 hour(s))  Urine Culture     Status: Abnormal (Preliminary result)   Collection Time: 12/30/17  8:44 AM  Result Value Ref Range Status   Specimen Description URINE, CATHETERIZED  Final   Special  Requests NONE  Final   Culture (A)  Final    >=100,000 COLONIES/mL ESCHERICHIA COLI SUSCEPTIBILITIES TO FOLLOW Performed at [facility], [address] [place], [place] [address]    Report Status PENDING  Incomplete  Blood Culture (routine x 2)     Status: None (Preliminary result)   Collection Time: 12/30/17  9:08 AM  Result Value Ref Range Status   Specimen Description BLOOD RIGHT ANTECUBITAL  Final   Special Requests   Final    BOTTLES DRAWN AEROBIC AND ANAEROBIC Blood Culture adequate volume   Culture  Setup Time   Final    ANAEROBIC BOTTLE ONLY GRAM NEGATIVE RODS CRITICAL VALUE NOTED.  VALUE IS CONSISTENT WITH PREVIOUSLY REPORTED AND CALLED VALUE. Performed at [facility], [place] [address] [place], [place] [address]    Culture GRAM NEGATIVE RODS  Final  Report Status PENDING  Incomplete  Blood Culture (routine x 2)     Status: Abnormal (Preliminary result)   Collection Time: 12/30/17  9:13 AM  Result Value Ref Range Status   Specimen Description BLOOD BLOOD LEFT HAND  Final   Special Requests   Final    BOTTLES DRAWN AEROBIC AND ANAEROBIC Blood Culture results may not be optimal due to an inadequate volume of blood received in culture bottles   Culture  Setup Time   Final    GRAM NEGATIVE RODS ANAEROBIC BOTTLE ONLY CRITICAL RESULT CALLED TO, READ BACK BY AND VERIFIED WITH: G ABBOTT PHARMD 12/31/17 0117 JDW    Culture (A)  Final    ESCHERICHIA COLI SUSCEPTIBILITIES TO FOLLOW Performed at [facility], [address] [place], [place] [address]    Report Status PENDING  Incomplete  Blood Culture ID Panel (Reflexed)     Status: Abnormal   Collection Time: 12/30/17  9:13 AM  Result Value Ref Range Status   Enterococcus species NOT DETECTED NOT DETECTED Final   Listeria monocytogenes NOT DETECTED NOT DETECTED Final   Staphylococcus species NOT DETECTED NOT DETECTED Final   Staphylococcus aureus NOT DETECTED NOT DETECTED Final   Streptococcus species NOT  DETECTED NOT DETECTED Final   Streptococcus agalactiae NOT DETECTED NOT DETECTED Final   Streptococcus pneumoniae NOT DETECTED NOT DETECTED Final   Streptococcus pyogenes NOT DETECTED NOT DETECTED Final   Acinetobacter baumannii NOT DETECTED NOT DETECTED Final   Enterobacteriaceae species DETECTED (A) NOT DETECTED Final    Comment: Enterobacteriaceae represent a large family of gram-negative bacteria, not a single organism. CRITICAL RESULT CALLED TO, READ BACK BY AND VERIFIED WITH: G ABBOTT PHARMD 12/31/17 0117 JDW    Enterobacter cloacae complex NOT DETECTED NOT DETECTED Final   Escherichia coli DETECTED (A) NOT DETECTED Final    Comment: CRITICAL RESULT CALLED TO, READ BACK BY AND VERIFIED WITH: G ABBOTT PHARMD 12/31/17 0117 JDW    Klebsiella oxytoca NOT DETECTED NOT DETECTED Final   Klebsiella pneumoniae NOT DETECTED NOT DETECTED Final   Proteus species NOT DETECTED NOT DETECTED Final   Serratia marcescens NOT DETECTED NOT DETECTED Final   Carbapenem resistance NOT DETECTED NOT DETECTED Final   Haemophilus influenzae NOT DETECTED NOT DETECTED Final   Neisseria meningitidis NOT DETECTED NOT DETECTED Final   Pseudomonas aeruginosa NOT DETECTED NOT DETECTED Final   Candida albicans NOT DETECTED NOT DETECTED Final   Candida glabrata NOT DETECTED NOT DETECTED Final   Candida krusei NOT DETECTED NOT DETECTED Final   Candida parapsilosis NOT DETECTED NOT DETECTED Final   Candida tropicalis NOT DETECTED NOT DETECTED Final    Comment: Performed at [facility], [place]. [address] [place], [place] [address]         Radiology Studies: Ct Head Wo Contrast  Result Date: 12/30/2017 CLINICAL DATA:  Increased weakness and decreased appetite. Disoriented. EXAM: CT HEAD WITHOUT CONTRAST TECHNIQUE: Contiguous axial images were obtained from the base of the skull through the vertex without intravenous contrast. COMPARISON:  02/19/2016; 06/16/2014 FINDINGS: Brain: Mild atrophy with volume loss and  prominence of the bifrontal extra-axial spaces. Gray-white differentiation is maintained. No CT evidence of acute large territory infarct. No intraparenchymal or extra-axial mass or hemorrhage. Normal size and configuration of the ventricles and the basilar cisterns. No midline shift. Vascular: No hyperdense vessel or unexpected calcification. Skull: No displaced calvarial fracture Sinuses/Orbits: Polypoid mucosal thickening of the left maxillary sinus. The remaining paranasal sinuses and mastoid air  cells are normally aerated. No air-fluid levels. Other: Regional soft tissues appear normal. IMPRESSION: Similar findings of mild atrophy without superimposed acute intracranial process. Electronically Signed   By: Sandi Mariscal M.D.   On: 12/30/2017 08:51   Dg Chest Port 1 View  Result Date: 12/30/2017 CLINICAL DATA:  Leukocytosis. EXAM: PORTABLE CHEST 1 VIEW COMPARISON:  None. FINDINGS: Cardiac enlargement without heart failure. Negative for pneumonia or effusion. Lungs are clear. IMPRESSION: Cardiac enlargement without acute abnormality. Electronically Signed   By: Franchot Gallo M.D.   On: 12/30/2017 08:56        Scheduled Meds: . aspirin EC  81 mg Oral Daily  . atorvastatin  40 mg Oral Daily  . enoxaparin (LOVENOX) injection  30 mg Subcutaneous Q24H  . insulin aspart  0-15 Units Subcutaneous TID WC  . insulin aspart  0-5 Units Subcutaneous QHS  . insulin glargine  30 Units Subcutaneous QHS  . sodium chloride flush  3 mL Intravenous Q12H   Continuous Infusions: . 0.9 % NaCl with KCl 40 mEq / L 75 mL/hr (12/31/17 0849)  . cefTRIAXone (ROCEPHIN)  IV 2 g (12/31/17 1110)     LOS: 1 day    Time spent: 35 minutes    Edwin Dada, MD [facility] 12/31/2017, 2:32 PM     Pager [phone] --- please page though AMION:  [email or web address] If 7PM-7AM, please contact night-coverage

## 2018-01-01 LAB — CBC
HCT: 35.1 % — ABNORMAL LOW (ref 36.0–46.0)
Hemoglobin: 12 g/dL (ref 12.0–15.0)
MCH: 29.6 pg (ref 26.0–34.0)
MCHC: 34.2 g/dL (ref 30.0–36.0)
MCV: 86.5 fL (ref 78.0–100.0)
PLATELETS: 132 10*3/uL — AB (ref 150–400)
RBC: 4.06 MIL/uL (ref 3.87–5.11)
RDW: 14.2 % (ref 11.5–15.5)
WBC: 12.8 10*3/uL — ABNORMAL HIGH (ref 4.0–10.5)

## 2018-01-01 LAB — BASIC METABOLIC PANEL
Anion gap: 8 (ref 5–15)
BUN: 43 mg/dL — ABNORMAL HIGH (ref 6–20)
CALCIUM: 7.9 mg/dL — AB (ref 8.9–10.3)
CO2: 20 mmol/L — ABNORMAL LOW (ref 22–32)
Chloride: 108 mmol/L (ref 98–111)
Creatinine, Ser: 2.46 mg/dL — ABNORMAL HIGH (ref 0.44–1.00)
GFR calc Af Amer: 24 mL/min — ABNORMAL LOW (ref >60)
GFR, EST NON AFRICAN AMERICAN: 20 mL/min — AB (ref >60)
Glucose, Bld: 176 mg/dL — ABNORMAL HIGH (ref 70–99)
POTASSIUM: 3.1 mmol/L — AB (ref 3.5–5.1)
SODIUM: 136 mmol/L (ref 135–145)

## 2018-01-01 LAB — PROTEIN / CREATININE RATIO, URINE
CREATININE, URINE: 52.71 mg/dL
Protein Creatinine Ratio: 0.97 mg/mg{Cre} — ABNORMAL HIGH (ref 0.00–0.15)
Total Protein, Urine: 51 mg/dL

## 2018-01-01 LAB — URINE CULTURE: Culture: >=100000 — AB

## 2018-01-01 LAB — GLUCOSE, CAPILLARY
GLUCOSE-CAPILLARY: 186 mg/dL — AB (ref 70–99)
GLUCOSE-CAPILLARY: 207 mg/dL — AB (ref 70–99)
GLUCOSE-CAPILLARY: 187 mg/dL — AB (ref 70–99)
Glucose-Capillary: 196 mg/dL — ABNORMAL HIGH (ref 70–99)

## 2018-01-01 MED ORDER — CARVEDILOL 6.25 MG PO TABS
6.2500 mg | ORAL_TABLET | Freq: Two times a day (BID) | ORAL | Status: DC
  Administered 2018-01-01 – 2018-01-03 (×4): 6.25 mg via ORAL
  Filled 2018-01-01 (×4): qty 1

## 2018-01-01 MED ORDER — POTASSIUM CHLORIDE CRYS ER 20 MEQ PO TBCR
40.0000 meq | EXTENDED_RELEASE_TABLET | Freq: Once | ORAL | Status: AC
  Administered 2018-01-01: 40 meq via ORAL
  Filled 2018-01-01: qty 2

## 2018-01-01 MED ORDER — CEFAZOLIN SODIUM-DEXTROSE 1-4 GM/50ML-% IV SOLN
1.0000 g | Freq: Three times a day (TID) | INTRAVENOUS | Status: DC
  Administered 2018-01-01 – 2018-01-02 (×5): 1 g via INTRAVENOUS
  Filled 2018-01-01 (×7): qty 50

## 2018-01-01 NOTE — Progress Notes (Signed)
ANTIBIOTIC CONSULT NOTE - INITIAL  Pharmacy Consult for cefazolin Indication: bacteremia  No Known Allergies  Patient Measurements: Height: 5\' 1"  (154.9 cm) Weight: 270 lb 0.1 oz (122.5 kg) IBW/kg (Calculated) : 47.8   Vital Signs: Temp: 99.1 F (37.3 C) (08/04 0916) Temp Source: Oral (08/04 0916) BP: 142/90 (08/04 0916) Pulse Rate: 89 (08/04 0916) Intake/Output from previous day: 08/03 0701 - 08/04 0700 In: 1572.6 [P.O.:480; I.V.:1092.6] Out: 270 [Urine:270] Intake/Output from this shift: Total I/O In: 240 [P.O.:240] Out: 0   Labs: Recent Labs    12/30/17 0812 12/30/17 0824 12/30/17 1358 12/31/17 0002 12/31/17 2145 01/01/18 0433 01/01/18 1256  WBC 21.0*  --   --  16.3*  --  12.8*  --   HGB 14.1 15.0  --  12.3  --  12.0  --   PLT 186  --   --  115*  --  132*  --   LABCREA  --   --   --   --  109.43  --  52.71  CREATININE 2.89* 2.80* 2.77* 2.84*  --  2.46*  --    Estimated Creatinine Clearance: 30.2 mL/min (A) (by C-G formula based on SCr of 2.46 mg/dL (H)). No results for input(s): VANCOTROUGH, VANCOPEAK, VANCORANDOM, GENTTROUGH, GENTPEAK, GENTRANDOM, TOBRATROUGH, TOBRAPEAK, TOBRARND, AMIKACINPEAK, AMIKACINTROU, AMIKACIN in the last 72 hours.   Microbiology: Recent Results (from the past 720 hour(s))  Urine Culture     Status: Abnormal   Collection Time: 12/30/17  8:44 AM  Result Value Ref Range Status   Specimen Description URINE, CATHETERIZED  Final   Special Requests   Final    NONE Performed at [facility], [address] [place], [place] [address]    Culture >=100,000 COLONIES/mL ESCHERICHIA COLI (A)  Final   Report Status 01/01/2018 FINAL  Final   Organism ID, Bacteria ESCHERICHIA COLI (A)  Final      Susceptibility   Escherichia coli - MIC*    AMPICILLIN >=32 RESISTANT Resistant     CEFAZOLIN <=4 SENSITIVE Sensitive     CEFTRIAXONE <=1 SENSITIVE Sensitive     CIPROFLOXACIN <=0.25 SENSITIVE Sensitive     GENTAMICIN <=1 SENSITIVE  Sensitive     IMIPENEM <=0.25 SENSITIVE Sensitive     NITROFURANTOIN <=16 SENSITIVE Sensitive     TRIMETH/SULFA >=320 RESISTANT Resistant     AMPICILLIN/SULBACTAM >=32 RESISTANT Resistant     PIP/TAZO <=4 SENSITIVE Sensitive     Extended ESBL NEGATIVE Sensitive     * >=100,000 COLONIES/mL ESCHERICHIA COLI  Blood Culture (routine x 2)     Status: None (Preliminary result)   Collection Time: 12/30/17  9:08 AM  Result Value Ref Range Status   Specimen Description BLOOD RIGHT ANTECUBITAL  Final   Special Requests   Final    BOTTLES DRAWN AEROBIC AND ANAEROBIC Blood Culture adequate volume   Culture  Setup Time   Final    GRAM NEGATIVE RODS IN BOTH AEROBIC AND ANAEROBIC BOTTLES CRITICAL VALUE NOTED.  VALUE IS CONSISTENT WITH PREVIOUSLY REPORTED AND CALLED VALUE. Performed at [facility], [place] [address] [place], [place] [address]    Culture GRAM NEGATIVE RODS  Final   Report Status PENDING  Incomplete  Blood Culture (routine x 2)     Status: Abnormal   Collection Time: 12/30/17  9:13 AM  Result Value Ref Range Status   Specimen Description BLOOD BLOOD LEFT HAND  Final   Special Requests   Final    BOTTLES DRAWN AEROBIC  AND ANAEROBIC Blood Culture results may not be optimal due to an inadequate volume of blood received in culture bottles   Culture  Setup Time   Final    GRAM NEGATIVE RODS ANAEROBIC BOTTLE ONLY CRITICAL RESULT CALLED TO, READ BACK BY AND VERIFIED WITH: Denton Brick Captain [facility] 12/31/17 0117 JDW Performed at [facility], [address] [place], [place] [address]    Culture ESCHERICHIA COLI (A)  Final   Report Status 01/01/2018 FINAL  Final   Organism ID, Bacteria ESCHERICHIA COLI  Final      Susceptibility   Escherichia coli - MIC*    AMPICILLIN >=32 RESISTANT Resistant     CEFAZOLIN <=4 SENSITIVE Sensitive     CEFEPIME <=1 SENSITIVE Sensitive     CEFTAZIDIME <=1 SENSITIVE Sensitive     CEFTRIAXONE <=1 SENSITIVE Sensitive     CIPROFLOXACIN <=0.25 SENSITIVE  Sensitive     GENTAMICIN <=1 SENSITIVE Sensitive     IMIPENEM <=0.25 SENSITIVE Sensitive     TRIMETH/SULFA >=320 RESISTANT Resistant     AMPICILLIN/SULBACTAM >=32 RESISTANT Resistant     PIP/TAZO <=4 SENSITIVE Sensitive     Extended ESBL NEGATIVE Sensitive     * ESCHERICHIA COLI  Blood Culture ID Panel (Reflexed)     Status: Abnormal   Collection Time: 12/30/17  9:13 AM  Result Value Ref Range Status   Enterococcus species NOT DETECTED NOT DETECTED Final   Listeria monocytogenes NOT DETECTED NOT DETECTED Final   Staphylococcus species NOT DETECTED NOT DETECTED Final   Staphylococcus aureus NOT DETECTED NOT DETECTED Final   Streptococcus species NOT DETECTED NOT DETECTED Final   Streptococcus agalactiae NOT DETECTED NOT DETECTED Final   Streptococcus pneumoniae NOT DETECTED NOT DETECTED Final   Streptococcus pyogenes NOT DETECTED NOT DETECTED Final   Acinetobacter baumannii NOT DETECTED NOT DETECTED Final   Enterobacteriaceae species DETECTED (A) NOT DETECTED Final    Comment: Enterobacteriaceae represent a large family of gram-negative bacteria, not a single organism. CRITICAL RESULT CALLED TO, READ BACK BY AND VERIFIED WITH: G ABBOTT PHARMD 12/31/17 0117 JDW    Enterobacter cloacae complex NOT DETECTED NOT DETECTED Final   Escherichia coli DETECTED (A) NOT DETECTED Final    Comment: CRITICAL RESULT CALLED TO, READ BACK BY AND VERIFIED WITH: G ABBOTT PHARMD 12/31/17 0117 JDW    Klebsiella oxytoca NOT DETECTED NOT DETECTED Final   Klebsiella pneumoniae NOT DETECTED NOT DETECTED Final   Proteus species NOT DETECTED NOT DETECTED Final   Serratia marcescens NOT DETECTED NOT DETECTED Final   Carbapenem resistance NOT DETECTED NOT DETECTED Final   Haemophilus influenzae NOT DETECTED NOT DETECTED Final   Neisseria meningitidis NOT DETECTED NOT DETECTED Final   Pseudomonas aeruginosa NOT DETECTED NOT DETECTED Final   Candida albicans NOT DETECTED NOT DETECTED Final   Candida glabrata NOT  DETECTED NOT DETECTED Final   Candida krusei NOT DETECTED NOT DETECTED Final   Candida parapsilosis NOT DETECTED NOT DETECTED Final   Candida tropicalis NOT DETECTED NOT DETECTED Final    Comment: Performed at [facility], [place]. [address], [place], [place] [address]    Medical History: Past Medical History:  Diagnosis Date  . Arthritis   . Depression   . Diabetes mellitus   . Hyperlipidemia   . Hypertension   . Sepsis ([place]) 12/2017     Assessment:  57 y.o. female who [facility] to [facility]2017 with a chief complaint of AMS/AKI and UTI. Discovered to have e.coli bacteremia which is  sensitive to cefazolin. MD wishes to narrow therapy. CrCl ~30 ml/min (patient with AKI). Patient is currently Day 3 of antibiotic therapy.    Plan:  D/C Ceftriaxone Start Cefazolin 1gm IV q8h Monitor clinical course and LOT  Zephyr Ridley A. Levada Dy, PharmD, [place] Pager: [phone] Please utilize Amion for appropriate phone number to reach the unit pharmacist ([place])    01/01/2018,4:13 PM

## 2018-01-01 NOTE — Progress Notes (Signed)
PROGRESS NOTE    Kimberly Frazier  WYO:[id number] DOB: 12/05/1960 DOA: 12/30/2017 PCP: Ladell Pier, MD      Brief Narrative:  Kimberly Frazier is a 57 y.o. F with HTN and DM who presents with 1 day malaise, confusion, shakes.  Found to have acute kidney injury, lactate 2.8, delirium, urinalysis suggestive of UTI.  CT head unremarkable.  Started on broad-spectrum antibiotics.      Assessment & Plan:  Sepsis from E. coli Lactic acid cleared, given 30 cc/kg IV fluids.  Blood cultures growing E. coli in 2 of 2 bottles.  White blood cell count trending down E. coli susceptible to cefazolin -Narrow to cefazolin   Acute renal failure Basleine Cr 0.8. So far, renal function improved today.  FeNA 0.3.  Renal US shows nodule, no other abnormality. -Continue IV fluids -Trend Cr  Renal lesion upper pole right kidney Incidental finding on US renal. -Will need Non-emergent MRI with and without contrast  Thrombocytopenia Mild, from sepsis.  Stable -Trend CBC  Diabetes Glucoses reasonable. -Continue glargine -Continue SSI  Hypertension Hypotensive at admission.  BP now resolved. -Hold losartan -Continue aspirin, statin -Restart carvedilol, less than home dose   Hypokalemia -Oral K -Continue IV fluids with K, cautiously     DVT prophylaxis: Loveno Code Status: FULL Family Communication: None presnet MDM and disposition Plan: The below labs and imaging reports were reviewed and summarized above.  The patient was admitted with sepsis from E. coli bacteremia, found to have renal failure which is now improving.  Appears to be prerenal.  We will continue IV fluids, narrowed to cefazolin, continue IV antibiotics.  If afebrile for 48 hours, taking p.o. well, and renal function continuing to improve, we can consider discharge in 2 to 3 days.   Consultants:   None  Procedures:   US renal FINDINGS: Right Kidney:  Length: 10.5 cm. No hydronephrosis. 2.3 cm hypoechoic  lesion identified in the central portion of the upper right kidney.  Left Kidney:  Length: 11.6 cm. Echogenicity within normal limits. No mass or hydronephrosis visualized.  Bladder:  Decompressed.  IMPRESSION: 1. No evidence for hydronephrosis. 2. 2.3 cm hypoechoic lesion in the upper pole right kidney. MRI without and with contrast would be the study of choice to further Evaluate.    Antimicrobials:   Vanc and Zosyn x1  Ceftriaxone 8/2 >> 8/4  Cefazolin 8/4 >>   Subjective: Feeling well, no new fever, Vomiting, flank pain, hematuria.  No confusion.  Abdominal pain.    Objective: Vitals:   12/31/17 1717 12/31/17 2135 01/01/18 0439 01/01/18 0916  BP: (!) 129/94 (!) 147/80 (!) 152/99 (!) 142/90  Pulse: (!) 101 (!) 104 97 89  Resp: 18 20 20 18   Temp: 98.8 F (37.1 C) 98.4 F (36.9 C) 98.9 F (37.2 C) 99.1 F (37.3 C)  TempSrc: Oral   Oral  SpO2: 98% 99% 100% 100%  Weight:      Height:        Intake/Output Summary (Last 24 hours) at 01/01/2018 1602 Last data filed at 01/01/2018 1010 Gross per 24 hour  Intake 1245 ml  Output 270 ml  Net 975 ml   Filed Weights   12/30/17 0800 12/30/17 2102  Weight: 122.5 kg (270 lb) 122.5 kg (270 lb 0.1 oz)    Examination: General appearance: Obese adult female, lying in bed, interactive, no acute distress HEENT: Anicteric, conjunctival pink, lids and lashes normal.  No nasal deformity, discharge, epistaxis.  Lips moist, oropharynx moist, no  oral lesions, hearing normal, teeth normal. Skin: Skin warm and dry without suspicious rashes or lesions  cardiac: RRR, no murmurs, no lower extremity edema Respiratory: Normal respiratory rate and rhythm, lungs clear without rales or wheezes Abdomen: Abdomen soft without tenderness to palpation, hepatosplenomegaly, CVA tenderness MSK: No deformities or effusions. Neuro: Awake and alert, extraocular movements intact, moves all extremities with equal strength, speech  fluent. Psych: Sensorium intact and responding to questions, attention normal, affect normal, judgment and insight appear normal.   Data Reviewed: I have personally reviewed following labs and imaging studies:  CBC: Recent Labs  Lab 12/30/17 0812 12/30/17 0824 12/31/17 0002 01/01/18 0433  WBC 21.0*  --  16.3* 12.8*  NEUTROABS 18.4*  --   --   --   HGB 14.1 15.0 12.3 12.0  HCT 41.5 44.0 36.6 35.1*  MCV 87.0  --  88.0 86.5  PLT 186  --  115* 333*   Basic Metabolic Panel: Recent Labs  Lab 12/30/17 0812 12/30/17 0824 12/30/17 1358 12/31/17 0002 01/01/18 0433  NA 130* 131* 134* 138 136  K 5.2* 5.7* 3.2* 3.1* 3.1*  CL 94* 96* 99 104 108  CO2 22  --  22 20* 20*  GLUCOSE 420* 419* 345* 241* 176*  BUN 41* 67* 41* 44* 43*  CREATININE 2.89* 2.80* 2.77* 2.84* 2.46*  CALCIUM 8.6*  --  8.0* 7.9* 7.9*   GFR: Estimated Creatinine Clearance: 30.2 mL/min (A) (by C-G formula based on SCr of 2.46 mg/dL (H)). Liver Function Tests: Recent Labs  Lab 12/30/17 0812  AST 38  ALT 18  ALKPHOS 116  BILITOT 3.4*  PROT 6.7  ALBUMIN 2.8*   No results for input(s): LIPASE, AMYLASE in the last 168 hours. No results for input(s): AMMONIA in the last 168 hours. Coagulation Profile: Recent Labs  Lab 12/30/17 0923  INR 1.22   Cardiac Enzymes: No results for input(s): CKTOTAL, CKMB, CKMBINDEX, TROPONINI in the last 168 hours. BNP (last 3 results) No results for input(s): PROBNP in the last 8760 hours. HbA1C: No results for input(s): HGBA1C in the last 72 hours. CBG: Recent Labs  Lab 12/31/17 1152 12/31/17 1706 12/31/17 2220 01/01/18 0732 01/01/18 1150  GLUCAP 214* 278* 179* 186* 196*   Lipid Profile: No results for input(s): CHOL, HDL, LDLCALC, TRIG, CHOLHDL, LDLDIRECT in the last 72 hours. Thyroid Function Tests: No results for input(s): TSH, T4TOTAL, FREET4, T3FREE, THYROIDAB in the last 72 hours. Anemia Panel: No results for input(s): VITAMINB12, FOLATE, FERRITIN, TIBC,  IRON, RETICCTPCT in the last 72 hours. Urine analysis:    Component Value Date/Time   COLORURINE YELLOW 12/30/2017 0844   APPEARANCEUR TURBID (A) 12/30/2017 0844   LABSPEC 1.025 12/30/2017 0844   PHURINE 5.5 12/30/2017 0844   GLUCOSEU 100 (A) 12/30/2017 0844   HGBUR LARGE (A) 12/30/2017 0844   BILIRUBINUR MODERATE (A) 12/30/2017 0844   KETONESUR 15 (A) 12/30/2017 0844   PROTEINUR >300 (A) 12/30/2017 0844   NITRITE POSITIVE (A) 12/30/2017 0844   LEUKOCYTESUR LARGE (A) 12/30/2017 0844   Sepsis Labs: @LABRCNTIP (procalcitonin:4,lacticacidven:4)  ) Recent Results (from the past 240 hour(s))  Urine Culture     Status: Abnormal   Collection Time: 12/30/17  8:44 AM  Result Value Ref Range Status   Specimen Description URINE, CATHETERIZED  Final   Special Requests   Final    NONE Performed at [facility], [address], [place], [place] [address]    Culture >=100,000 COLONIES/mL ESCHERICHIA COLI (A)  Final  Report Status 01/01/2018 FINAL  Final   Organism ID, Bacteria ESCHERICHIA COLI (A)  Final      Susceptibility   Escherichia coli - MIC*    AMPICILLIN >=32 RESISTANT Resistant     CEFAZOLIN <=4 SENSITIVE Sensitive     CEFTRIAXONE <=1 SENSITIVE Sensitive     CIPROFLOXACIN <=0.25 SENSITIVE Sensitive     GENTAMICIN <=1 SENSITIVE Sensitive     IMIPENEM <=0.25 SENSITIVE Sensitive     NITROFURANTOIN <=16 SENSITIVE Sensitive     TRIMETH/SULFA >=320 RESISTANT Resistant     AMPICILLIN/SULBACTAM >=32 RESISTANT Resistant     PIP/TAZO <=4 SENSITIVE Sensitive     Extended ESBL NEGATIVE Sensitive     * >=100,000 COLONIES/mL ESCHERICHIA COLI  Blood Culture (routine x 2)     Status: None (Preliminary result)   Collection Time: 12/30/17  9:08 AM  Result Value Ref Range Status   Specimen Description BLOOD RIGHT ANTECUBITAL  Final   Special Requests   Final    BOTTLES DRAWN AEROBIC AND ANAEROBIC Blood Culture adequate volume   Culture  Setup Time   Final    GRAM NEGATIVE RODS IN  BOTH AEROBIC AND ANAEROBIC BOTTLES CRITICAL VALUE NOTED.  VALUE IS CONSISTENT WITH PREVIOUSLY REPORTED AND CALLED VALUE. Performed at [facility], [place] [address], [place], [place] [address]    Culture GRAM NEGATIVE RODS  Final   Report Status PENDING  Incomplete  Blood Culture (routine x 2)     Status: Abnormal   Collection Time: 12/30/17  9:13 AM  Result Value Ref Range Status   Specimen Description BLOOD BLOOD LEFT HAND  Final   Special Requests   Final    BOTTLES DRAWN AEROBIC AND ANAEROBIC Blood Culture results may not be optimal due to an inadequate volume of blood received in culture bottles   Culture  Setup Time   Final    GRAM NEGATIVE RODS ANAEROBIC BOTTLE ONLY CRITICAL RESULT CALLED TO, READ BACK BY AND VERIFIED WITH: Denton Brick [facility] 12/31/17 0117 JDW Performed at [facility], [address] [place], [place] [address]    Culture ESCHERICHIA COLI (A)  Final   Report Status 01/01/2018 FINAL  Final   Organism ID, Bacteria ESCHERICHIA COLI  Final      Susceptibility   Escherichia coli - MIC*    AMPICILLIN >=32 RESISTANT Resistant     CEFAZOLIN <=4 SENSITIVE Sensitive     CEFEPIME <=1 SENSITIVE Sensitive     CEFTAZIDIME <=1 SENSITIVE Sensitive     CEFTRIAXONE <=1 SENSITIVE Sensitive     CIPROFLOXACIN <=0.25 SENSITIVE Sensitive     GENTAMICIN <=1 SENSITIVE Sensitive     IMIPENEM <=0.25 SENSITIVE Sensitive     TRIMETH/SULFA >=320 RESISTANT Resistant     AMPICILLIN/SULBACTAM >=32 RESISTANT Resistant     PIP/TAZO <=4 SENSITIVE Sensitive     Extended ESBL NEGATIVE Sensitive     * ESCHERICHIA COLI  Blood Culture ID Panel (Reflexed)     Status: Abnormal   Collection Time: 12/30/17  9:13 AM  Result Value Ref Range Status   Enterococcus species NOT DETECTED NOT DETECTED Final   Listeria monocytogenes NOT DETECTED NOT DETECTED Final   Staphylococcus species NOT DETECTED NOT DETECTED Final   Staphylococcus aureus NOT DETECTED NOT DETECTED Final   Streptococcus  species NOT DETECTED NOT DETECTED Final   Streptococcus agalactiae NOT DETECTED NOT DETECTED Final   Streptococcus pneumoniae NOT DETECTED NOT DETECTED Final   Streptococcus pyogenes NOT DETECTED NOT DETECTED Final  Acinetobacter baumannii NOT DETECTED NOT DETECTED Final   Enterobacteriaceae species DETECTED (A) NOT DETECTED Final    Comment: Enterobacteriaceae represent a large family of gram-negative bacteria, not a single organism. CRITICAL RESULT CALLED TO, READ BACK BY AND VERIFIED WITH: G ABBOTT PHARMD 12/31/17 0117 JDW    Enterobacter cloacae complex NOT DETECTED NOT DETECTED Final   Escherichia coli DETECTED (A) NOT DETECTED Final    Comment: CRITICAL RESULT CALLED TO, READ BACK BY AND VERIFIED WITH: G ABBOTT PHARMD 12/31/17 0117 JDW    Klebsiella oxytoca NOT DETECTED NOT DETECTED Final   Klebsiella pneumoniae NOT DETECTED NOT DETECTED Final   Proteus species NOT DETECTED NOT DETECTED Final   Serratia marcescens NOT DETECTED NOT DETECTED Final   Carbapenem resistance NOT DETECTED NOT DETECTED Final   Haemophilus influenzae NOT DETECTED NOT DETECTED Final   Neisseria meningitidis NOT DETECTED NOT DETECTED Final   Pseudomonas aeruginosa NOT DETECTED NOT DETECTED Final   Candida albicans NOT DETECTED NOT DETECTED Final   Candida glabrata NOT DETECTED NOT DETECTED Final   Candida krusei NOT DETECTED NOT DETECTED Final   Candida parapsilosis NOT DETECTED NOT DETECTED Final   Candida tropicalis NOT DETECTED NOT DETECTED Final    Comment: Performed at [facility], [place], [place], [place] [address]         Radiology Studies: US Renal  Result Date: 12/31/2017 CLINICAL DATA:  Acute renal failure EXAM: RENAL / URINARY TRACT ULTRASOUND COMPLETE COMPARISON:  None. FINDINGS: Right Kidney: Length: 10.5 cm. No hydronephrosis. 2.3 cm hypoechoic lesion identified in the central portion of the upper right kidney. Left Kidney: Length: 11.6 cm. Echogenicity within normal limits.  No mass or hydronephrosis visualized. Bladder: Decompressed. IMPRESSION: 1. No evidence for hydronephrosis. 2. 2.3 cm hypoechoic lesion in the upper pole right kidney. MRI without and with contrast would be the study of choice to further evaluate. Electronically Signed   By: Misty Stanley M.D.   On: 12/31/2017 21:17        Scheduled Meds: . aspirin EC  81 mg Oral Daily  . atorvastatin  40 mg Oral Daily  . enoxaparin (LOVENOX) injection  30 mg Subcutaneous Q24H  . insulin aspart  0-15 Units Subcutaneous TID WC  . insulin aspart  0-5 Units Subcutaneous QHS  . insulin glargine  30 Units Subcutaneous QHS  . sodium chloride flush  3 mL Intravenous Q12H   Continuous Infusions: . 0.9 % NaCl with KCl 20 mEq / L 75 mL/hr at 01/01/18 1559  . cefTRIAXone (ROCEPHIN)  IV 2 g (01/01/18 0910)     LOS: 2 days    Time spent: 25 minutes    Edwin Dada, MD [facility] 01/01/2018, 4:02 PM     Pager [phone] --- please page though AMION:  [email or web address] If 7PM-7AM, please contact night-coverage

## 2018-01-02 LAB — CBC
HCT: 36 % (ref 36.0–46.0)
HEMOGLOBIN: 12 g/dL (ref 12.0–15.0)
MCH: 29.1 pg (ref 26.0–34.0)
MCHC: 33.3 g/dL (ref 30.0–36.0)
MCV: 87.4 fL (ref 78.0–100.0)
PLATELETS: 149 10*3/uL — AB (ref 150–400)
RBC: 4.12 MIL/uL (ref 3.87–5.11)
RDW: 14.7 % (ref 11.5–15.5)
WBC: 13.3 10*3/uL — ABNORMAL HIGH (ref 4.0–10.5)

## 2018-01-02 LAB — BASIC METABOLIC PANEL
Anion gap: 8 (ref 5–15)
BUN: 34 mg/dL — AB (ref 6–20)
CALCIUM: 8.4 mg/dL — AB (ref 8.9–10.3)
CO2: 21 mmol/L — ABNORMAL LOW (ref 22–32)
CREATININE: 2.07 mg/dL — AB (ref 0.44–1.00)
Chloride: 110 mmol/L (ref 98–111)
GFR, EST AFRICAN AMERICAN: 29 mL/min — AB (ref >60)
GFR, EST NON AFRICAN AMERICAN: 25 mL/min — AB (ref >60)
Glucose, Bld: 194 mg/dL — ABNORMAL HIGH (ref 70–99)
Potassium: 3.4 mmol/L — ABNORMAL LOW (ref 3.5–5.1)
SODIUM: 139 mmol/L (ref 135–145)

## 2018-01-02 LAB — GLUCOSE, CAPILLARY
GLUCOSE-CAPILLARY: 195 mg/dL — AB (ref 70–99)
Glucose-Capillary: 213 mg/dL — ABNORMAL HIGH (ref 70–99)
Glucose-Capillary: 259 mg/dL — ABNORMAL HIGH (ref 70–99)
Glucose-Capillary: 197 mg/dL — ABNORMAL HIGH (ref 70–99)

## 2018-01-02 MED ORDER — ENOXAPARIN SODIUM 60 MG/0.6ML ~~LOC~~ SOLN
60.0000 mg | SUBCUTANEOUS | Status: DC
  Administered 2018-01-03: 60 mg via SUBCUTANEOUS
  Filled 2018-01-02: qty 0.6

## 2018-01-02 MED ORDER — INSULIN GLARGINE 100 UNIT/ML ~~LOC~~ SOLN
34.0000 [IU] | Freq: Every day | SUBCUTANEOUS | Status: DC
  Administered 2018-01-02: 34 [IU] via SUBCUTANEOUS
  Filled 2018-01-02 (×2): qty 0.34

## 2018-01-02 MED ORDER — SACCHAROMYCES BOULARDII 250 MG PO CAPS
250.0000 mg | ORAL_CAPSULE | Freq: Two times a day (BID) | ORAL | Status: DC
  Administered 2018-01-02 – 2018-01-03 (×3): 250 mg via ORAL
  Filled 2018-01-02 (×3): qty 1

## 2018-01-02 MED ORDER — INSULIN ASPART 100 UNIT/ML ~~LOC~~ SOLN
0.0000 [IU] | Freq: Three times a day (TID) | SUBCUTANEOUS | Status: DC
  Administered 2018-01-02: 11 [IU] via SUBCUTANEOUS
  Administered 2018-01-03: 4 [IU] via SUBCUTANEOUS
  Administered 2018-01-03: 7 [IU] via SUBCUTANEOUS

## 2018-01-02 MED ORDER — LOPERAMIDE HCL 2 MG PO CAPS: 2.0000 mg | ORAL_CAPSULE | ORAL | Status: DC | PRN

## 2018-01-02 NOTE — Progress Notes (Signed)
Pharmacy: Lovenox Dose Adjustment for VTE prophylaxis  OBJECTIVE:  Wt: 122.5 kg   Ht: 5'1"  BMI~50 SCr 2.07  CrCl~40 ml/min   ASSESSMENT:  89 YOM on lovenox for VTE prophylaxis requiring a dose adjustment for BMI>30 and CrCl>30 ml/min  PLAN:  1. Adjust Lovenox to 60 mg (~0.5 mg/kg) SQ every 24 hours 2. Pharmacy will monitor peripherally for s/sx of bleeding and any necessary dose adjustments   Alycia Rossetti, PharmD, BCPS Pager: [phone] 2:48 PM

## 2018-01-02 NOTE — Progress Notes (Signed)
PROGRESS NOTE    Kimberly Frazier  KKX:[id number] DOB: August 19, 1960 DOA: 12/30/2017 PCP: Ladell Pier, MD      Brief Narrative:  Kimberly Frazier is a 57 y.o. F with HTN and DM who presents with 1 day malaise, confusion, shakes.  Found to have acute kidney injury, lactate 2.8, delirium, urinalysis suggestive of UTI.  CT head unremarkable.  Started on broad-spectrum antibiotics.      Assessment & Plan:  Sepsis from E. coli Given 30 cc/kg IV fluids, lactate cleared.  Blood cultures growing E. coli in 2 of 2 bottles.  White blood cell count trended down, no new fever. E. coli susceptible to cefazolin -Continue cefazolin   Acute renal failure Baseline Cr 0.8 last fall. So far, renal function improving with fluids.  FeNA 0.3.  Renal US shows nodule, no other abnormality. -Continue IV fluids -Trend Cr  Renal lesion upper pole right kidney Incidental finding on US renal. -Will need Non-emergent MRI with and without contrast  Thrombocytopenia Mild, from sepsis.  Improving. -Trend CBC  Diabetes Glucoses reasonable. -Continue glargine, increase dose -Continue SSI  Hypertension Hypotensive at admission.  BP now resolved. -Hold losartan -Continue aspirin, statin -Continue carvediol, less than home dose   Hypokalemia Resolved -Continue IV lfuids with K  Diarrhea Diarrhea with antibiotics -Start florastor       DVT prophylaxis: Lovenox Code Status: FULL Family Communication: None presnet MDM and disposition Plan: The below labs and imaging reports were reviewed and summarized above.  Medication management as above.  The patient was admitted with sepsis from E. coli bacteremia, found to have renal failure which still greater than 2 times per baseline, only slowly improving.  We have narrowed cefazolin, will continue IV fluids and IV antibiotics.  If afebrile for 48 hours, taking p.o. well, renal function continues to improve we can continue consider discharge in 1 to 2  days     Consultants:   None  Procedures:   US renal FINDINGS: Right Kidney:  Length: 10.5 cm. No hydronephrosis. 2.3 cm hypoechoic lesion identified in the central portion of the upper right kidney.  Left Kidney:  Length: 11.6 cm. Echogenicity within normal limits. No mass or hydronephrosis visualized.  Bladder:  Decompressed.  IMPRESSION: 1. No evidence for hydronephrosis. 2. 2.3 cm hypoechoic lesion in the upper pole right kidney. MRI without and with contrast would be the study of choice to further Evaluate.    Antimicrobials:   Vanc and Zosyn x1  Ceftriaxone 8/2 >> 8/4  Cefazolin 8/4 >>   Subjective: Feeling well.  No fever, vomiting, hematuria.  Mild flank pain.  No confusion, abdominal pain, dysuria.  Diarrhea probably 10 times yesterday    Objective: Vitals:   01/01/18 2120 01/02/18 0456 01/02/18 0459 01/02/18 0935  BP: (!) 158/90 (!) 157/106 133/73 (!) 147/90  Pulse: 89 88 84 86  Resp: 18 18  18   Temp: 98.1 F (36.7 C) 98.4 F (36.9 C)  98.3 F (36.8 C)  TempSrc:    Oral  SpO2: 99% 94% 100% 100%  Weight:      Height:        Intake/Output Summary (Last 24 hours) at 01/02/2018 1427 Last data filed at 01/02/2018 1400 Gross per 24 hour  Intake 1331.21 ml  Output 300 ml  Net 1031.21 ml   Filed Weights   12/30/17 0800 12/30/17 2102  Weight: 122.5 kg (270 lb) 122.5 kg (270 lb 0.1 oz)    Examination: General appearance: Obese adult female, just got out  of the shower, sitting in recliner, no acute distress HEENT: Anicteric, conjunctival pink, lids and lashes normal, no nasal discharge or epistaxis.  Lips normal, mostly edentulous, oropharynx moist, no oral lesions.  Hearing normal. Skin: Skin warm and dry without suspicious rashes or lesions cardiac: Regular rate and rhythm, no murmurs, no lower extremity edema Respiratory: Normal respiratory rate and rhythm, lungs clear without rales or wheezes Abdomen: Abdomen soft without  tenderness to palpation, hepatospleno megaly, CVA tenderness. MSK: No deformities or effusions. Neuro: Awake and alert, extraocular movements intact, moves all extremities with equal strength strength, speech fluent Psych: I am intact and responding to questions, attention normal.  Affect normal.  Judgment and insight appear normal.    Data Reviewed: I have personally reviewed following labs and imaging studies:  CBC: Recent Labs  Lab 12/30/17 0812 12/30/17 0824 12/31/17 0002 01/01/18 0433 01/02/18 0505  WBC 21.0*  --  16.3* 12.8* 13.3*  NEUTROABS 18.4*  --   --   --   --   HGB 14.1 15.0 12.3 12.0 12.0  HCT 41.5 44.0 36.6 35.1* 36.0  MCV 87.0  --  88.0 86.5 87.4  PLT 186  --  115* 132* 323*   Basic Metabolic Panel: Recent Labs  Lab 12/30/17 0812 12/30/17 0824 12/30/17 1358 12/31/17 0002 01/01/18 0433 01/02/18 0505  NA 130* 131* 134* 138 136 139  K 5.2* 5.7* 3.2* 3.1* 3.1* 3.4*  CL 94* 96* 99 104 108 110  CO2 22  --  22 20* 20* 21*  GLUCOSE 420* 419* 345* 241* 176* 194*  BUN 41* 67* 41* 44* 43* 34*  CREATININE 2.89* 2.80* 2.77* 2.84* 2.46* 2.07*  CALCIUM 8.6*  --  8.0* 7.9* 7.9* 8.4*   GFR: Estimated Creatinine Clearance: 35.9 mL/min (A) (by C-G formula based on SCr of 2.07 mg/dL (H)). Liver Function Tests: Recent Labs  Lab 12/30/17 0812  AST 38  ALT 18  ALKPHOS 116  BILITOT 3.4*  PROT 6.7  ALBUMIN 2.8*   No results for input(s): LIPASE, AMYLASE in the last 168 hours. No results for input(s): AMMONIA in the last 168 hours. Coagulation Profile: Recent Labs  Lab 12/30/17 0923  INR 1.22   Cardiac Enzymes: No results for input(s): CKTOTAL, CKMB, CKMBINDEX, TROPONINI in the last 168 hours. BNP (last 3 results) No results for input(s): PROBNP in the last 8760 hours. HbA1C: No results for input(s): HGBA1C in the last 72 hours. CBG: Recent Labs  Lab 01/01/18 1150 01/01/18 1633 01/01/18 2120 01/02/18 0805 01/02/18 1203  GLUCAP 196* 207* 187* 195*  213*   Lipid Profile: No results for input(s): CHOL, HDL, LDLCALC, TRIG, CHOLHDL, LDLDIRECT in the last 72 hours. Thyroid Function Tests: No results for input(s): TSH, T4TOTAL, FREET4, T3FREE, THYROIDAB in the last 72 hours. Anemia Panel: No results for input(s): VITAMINB12, FOLATE, FERRITIN, TIBC, IRON, RETICCTPCT in the last 72 hours. Urine analysis:    Component Value Date/Time   COLORURINE YELLOW 12/30/2017 0844   APPEARANCEUR TURBID (A) 12/30/2017 0844   LABSPEC 1.025 12/30/2017 0844   PHURINE 5.5 12/30/2017 0844   GLUCOSEU 100 (A) 12/30/2017 0844   HGBUR LARGE (A) 12/30/2017 0844   BILIRUBINUR MODERATE (A) 12/30/2017 0844   KETONESUR 15 (A) 12/30/2017 0844   PROTEINUR >300 (A) 12/30/2017 0844   NITRITE POSITIVE (A) 12/30/2017 0844   LEUKOCYTESUR LARGE (A) 12/30/2017 0844   Sepsis Labs: @LABRCNTIP (procalcitonin:4,lacticacidven:4)  ) Recent Results (from the past 240 hour(s))  Urine Culture     Status: Abnormal  Collection Time: 12/30/17  8:44 AM  Result Value Ref Range Status   Specimen Description URINE, CATHETERIZED  Final   Special Requests   Final    NONE Performed at [facility], [address], [place], [place] [address]    Culture >=100,000 COLONIES/mL ESCHERICHIA COLI (A)  Final   Report Status 01/01/2018 FINAL  Final   Organism ID, Bacteria ESCHERICHIA COLI (A)  Final      Susceptibility   Escherichia coli - MIC*    AMPICILLIN >=32 RESISTANT Resistant     CEFAZOLIN <=4 SENSITIVE Sensitive     CEFTRIAXONE <=1 SENSITIVE Sensitive     CIPROFLOXACIN <=0.25 SENSITIVE Sensitive     GENTAMICIN <=1 SENSITIVE Sensitive     IMIPENEM <=0.25 SENSITIVE Sensitive     NITROFURANTOIN <=16 SENSITIVE Sensitive     TRIMETH/SULFA >=320 RESISTANT Resistant     AMPICILLIN/SULBACTAM >=32 RESISTANT Resistant     PIP/TAZO <=4 SENSITIVE Sensitive     Extended ESBL NEGATIVE Sensitive     * >=100,000 COLONIES/mL ESCHERICHIA COLI  Blood Culture (routine x 2)     Status:  Abnormal (Preliminary result)   Collection Time: 12/30/17  9:08 AM  Result Value Ref Range Status   Specimen Description BLOOD RIGHT ANTECUBITAL  Final   Special Requests   Final    BOTTLES DRAWN AEROBIC AND ANAEROBIC Blood Culture adequate volume   Culture  Setup Time   Final    GRAM NEGATIVE RODS IN BOTH AEROBIC AND ANAEROBIC BOTTLES CRITICAL VALUE NOTED.  VALUE IS CONSISTENT WITH PREVIOUSLY REPORTED AND CALLED VALUE.    Culture (A)  Final    ESCHERICHIA COLI SUSCEPTIBILITIES PERFORMED ON PREVIOUS CULTURE WITHIN THE LAST 5 DAYS. Performed at [facility], [place] [address], [place], [place] [address]    Report Status PENDING  Incomplete  Blood Culture (routine x 2)     Status: Abnormal   Collection Time: 12/30/17  9:13 AM  Result Value Ref Range Status   Specimen Description BLOOD BLOOD LEFT HAND  Final   Special Requests   Final    BOTTLES DRAWN AEROBIC AND ANAEROBIC Blood Culture results may not be optimal due to an inadequate volume of blood received in culture bottles   Culture  Setup Time   Final    GRAM NEGATIVE RODS ANAEROBIC BOTTLE ONLY CRITICAL RESULT CALLED TO, READ BACK BY AND VERIFIED WITH: [facility] 12/31/17 0117 JDW Performed at [facility], [place] [address], [place], [place] [address]    Culture ESCHERICHIA COLI (A)  Final   Report Status 01/01/2018 FINAL  Final   Organism ID, Bacteria ESCHERICHIA COLI  Final      Susceptibility   Escherichia coli - MIC*    AMPICILLIN >=32 RESISTANT Resistant     CEFAZOLIN <=4 SENSITIVE Sensitive     CEFEPIME <=1 SENSITIVE Sensitive     CEFTAZIDIME <=1 SENSITIVE Sensitive     CEFTRIAXONE <=1 SENSITIVE Sensitive     CIPROFLOXACIN <=0.25 SENSITIVE Sensitive     GENTAMICIN <=1 SENSITIVE Sensitive     IMIPENEM <=0.25 SENSITIVE Sensitive     TRIMETH/SULFA >=320 RESISTANT Resistant     AMPICILLIN/SULBACTAM >=32 RESISTANT Resistant     PIP/TAZO <=4 SENSITIVE Sensitive     Extended ESBL NEGATIVE Sensitive     *  ESCHERICHIA COLI  Blood Culture ID Panel (Reflexed)     Status: Abnormal   Collection Time: 12/30/17  9:13 AM  Result Value Ref Range Status  Enterococcus species NOT DETECTED NOT DETECTED Final   Listeria monocytogenes NOT DETECTED NOT DETECTED Final   Staphylococcus species NOT DETECTED NOT DETECTED Final   Staphylococcus aureus NOT DETECTED NOT DETECTED Final   Streptococcus species NOT DETECTED NOT DETECTED Final   Streptococcus agalactiae NOT DETECTED NOT DETECTED Final   Streptococcus pneumoniae NOT DETECTED NOT DETECTED Final   Streptococcus pyogenes NOT DETECTED NOT DETECTED Final   Acinetobacter baumannii NOT DETECTED NOT DETECTED Final   Enterobacteriaceae species DETECTED (A) NOT DETECTED Final    Comment: Enterobacteriaceae represent a large family of gram-negative bacteria, not a single organism. CRITICAL RESULT CALLED TO, READ BACK BY AND VERIFIED WITH: G ABBOTT PHARMD 12/31/17 0117 JDW    Enterobacter cloacae complex NOT DETECTED NOT DETECTED Final   Escherichia coli DETECTED (A) NOT DETECTED Final    Comment: CRITICAL RESULT CALLED TO, READ BACK BY AND VERIFIED WITH: G ABBOTT PHARMD 12/31/17 0117 JDW    Klebsiella oxytoca NOT DETECTED NOT DETECTED Final   Klebsiella pneumoniae NOT DETECTED NOT DETECTED Final   Proteus species NOT DETECTED NOT DETECTED Final   Serratia marcescens NOT DETECTED NOT DETECTED Final   Carbapenem resistance NOT DETECTED NOT DETECTED Final   Haemophilus influenzae NOT DETECTED NOT DETECTED Final   Neisseria meningitidis NOT DETECTED NOT DETECTED Final   Pseudomonas aeruginosa NOT DETECTED NOT DETECTED Final   Candida albicans NOT DETECTED NOT DETECTED Final   Candida glabrata NOT DETECTED NOT DETECTED Final   Candida krusei NOT DETECTED NOT DETECTED Final   Candida parapsilosis NOT DETECTED NOT DETECTED Final   Candida tropicalis NOT DETECTED NOT DETECTED Final    Comment: Performed at [facility], [place]. [address], [place],   [address]         Radiology Studies: US Renal  Result Date: 12/31/2017 CLINICAL DATA:  Acute renal failure EXAM: RENAL / URINARY TRACT ULTRASOUND COMPLETE COMPARISON:  None. FINDINGS: Right Kidney: Length: 10.5 cm. No hydronephrosis. 2.3 cm hypoechoic lesion identified in the central portion of the upper right kidney. Left Kidney: Length: 11.6 cm. Echogenicity within normal limits. No mass or hydronephrosis visualized. Bladder: Decompressed. IMPRESSION: 1. No evidence for hydronephrosis. 2. 2.3 cm hypoechoic lesion in the upper pole right kidney. MRI without and with contrast would be the study of choice to further evaluate. Electronically Signed   By: Misty Stanley M.D.   On: 12/31/2017 21:17        Scheduled Meds: . aspirin EC  81 mg Oral Daily  . atorvastatin  40 mg Oral Daily  . carvedilol  6.25 mg Oral BID WC  . enoxaparin (LOVENOX) injection  30 mg Subcutaneous Q24H  . insulin aspart  0-15 Units Subcutaneous TID WC  . insulin aspart  0-5 Units Subcutaneous QHS  . insulin glargine  30 Units Subcutaneous QHS  . saccharomyces boulardii  250 mg Oral BID  . sodium chloride flush  3 mL Intravenous Q12H   Continuous Infusions: . 0.9 % NaCl with KCl 20 mEq / L 75 mL/hr at 01/02/18 1200  .  ceFAZolin (ANCEF) IV 1 g (01/02/18 1420)     LOS: 3 days    Time spent:25 minutes    Edwin Dada, MD [facility] 01/02/2018, 2:27 PM     Pager [phone] --- please page though AMION:  [email or web address] If 7PM-7AM, please contact night-coverage

## 2018-01-02 NOTE — Progress Notes (Signed)
PROGRESS NOTE  Kimberly Frazier  WER:[id number] DOB: 1961/02/06 DOA: 12/30/2017 PCP: Ladell Pier, MD   Brief Narrative:  Kimberly Frazier is a 57 y.o. female with a past medical history significant for DM2, hyperlipidemia, HTN, and CAD. She presented to the emergency department on on 8/2 with a 2 day history of confusion and dysuria, requiring assistance with iADLs. She had associated back pain, abdominal pain, and one episode of emesis. She was found to have AKI, lactic acid of 2.8, and a UA indicating infection. CT head unremarkable. She was started on Vancomycin and Zosyn and admitted for further evaluation and treatment.  Subjective: Patient is complaining of 3 days of watery diarrhea which she is attributing to the antibiotics. She is having up to 10 episodes a day with some mild abdominal cramping. Very mild residual flank pain. No fevers/chills, dysuria, CP or SOB.  Assessment & Plan:  E. Coli Bacteremia secondary to UTI  - Blood cultures growing EColi, sensitive to cefazolin. Transitioned to cefazolin on 8/4  - Remained afebrile overnight. Slight bump in WBC today to 13.3 - Added Florastor today and prn imodium for diarrhea.   AKI  - Baseline sCr appears to be 0.8. Improved today at 2.07. - Continue IVF  - Monitor with daily BMP  Renal lesion  - Incidental finding from ultrasound. Will eventually need an MRI w/o contrast as an outpatient.   Diabetes Mellitus  - CBGs stable. Continue SSI and lantus.  Hypertension - Hypotensive upon admission. BP now stable. Continue reduced dose of carvedilol. Hold Statin.  Hypokalemia - Continue gentle supplement with IV and PO.   DVT prophylaxis: Lovenox  Code Status: FULL Family Communication: No family at bedside  Disposition Plan: Continue IVF and IV Cefazolin. If sCr continues to improve and approach baseline and she remains afebrile, potentially plan to discharge 1-2 days.  Consultants:   None  Procedures:   Renal  US  Antimicrobials:   Vancomycin / Zosyn x 1 8/2  Ceftriaxone 8/2 --> 8/4  Cefazolin 8/4 -->   Objective: Vitals:   01/01/18 2120 01/02/18 0456 01/02/18 0459 01/02/18 0935  BP: (!) 158/90 (!) 157/106 133/73 (!) 147/90  Pulse: 89 88 84 86  Resp: 18 18  18   Temp: 98.1 F (36.7 C) 98.4 F (36.9 C)  98.3 F (36.8 C)  TempSrc:    Oral  SpO2: 99% 94% 100% 100%  Weight:      Height:        Intake/Output Summary (Last 24 hours) at 01/02/2018 0943 Last data filed at 01/02/2018 0600 Gross per 24 hour  Intake 2035.73 ml  Output 300 ml  Net 1735.73 ml   Filed Weights   12/30/17 0800 12/30/17 2102  Weight: 122.5 kg (270 lb) 122.5 kg (270 lb 0.1 oz)   Examination: General appearance: obese adult female laying in bed, awake and alert. NAD.   HEENT: Anicteric, conjunctiva pink, lids and lashes normal. No nasal deformity, discharge, epistaxis.  Lips moist. Skin: Warm and dry. No jaundice.  No suspicious rashes or lesions. Cardiac: RRR, nl S1-S2, no murmurs appreciated. No JVD. No LE edema. No cyanosis. Distal pulses are intact bilaterally.Marland Kitchen Respiratory: Normal respiratory rate and rhythm. CTAB without wheezes, crackles or rales. Abdomen: Abdomen soft and non-distended. No TTP. No CVA tenderness. No ascites, masses, hepatosplenomegaly appreciated. Positive bowel sounds throughout. MSK: No deformities or effusions. Neuro: AOx3. Moves all extremities. Speech fluent.     Data Reviewed: I have personally reviewed following labs and imaging studies:  CBC: Recent Labs  Lab 12/30/17 0812 12/30/17 0824 12/31/17 0002 01/01/18 0433 01/02/18 0505  WBC 21.0*  --  16.3* 12.8* 13.3*  NEUTROABS 18.4*  --   --   --   --   HGB 14.1 15.0 12.3 12.0 12.0  HCT 41.5 44.0 36.6 35.1* 36.0  MCV 87.0  --  88.0 86.5 87.4  PLT 186  --  115* 132* 614*   Basic Metabolic Panel: Recent Labs  Lab 12/30/17 0812 12/30/17 0824 12/30/17 1358 12/31/17 0002 01/01/18 0433 01/02/18 0505  NA 130* 131* 134*  138 136 139  K 5.2* 5.7* 3.2* 3.1* 3.1* 3.4*  CL 94* 96* 99 104 108 110  CO2 22  --  22 20* 20* 21*  GLUCOSE 420* 419* 345* 241* 176* 194*  BUN 41* 67* 41* 44* 43* 34*  CREATININE 2.89* 2.80* 2.77* 2.84* 2.46* 2.07*  CALCIUM 8.6*  --  8.0* 7.9* 7.9* 8.4*   GFR: Estimated Creatinine Clearance: 35.9 mL/min (A) (by C-G formula based on SCr of 2.07 mg/dL (H)). Liver Function Tests: Recent Labs  Lab 12/30/17 0812  AST 38  ALT 18  ALKPHOS 116  BILITOT 3.4*  PROT 6.7  ALBUMIN 2.8*   Coagulation Profile: Recent Labs  Lab 12/30/17 0923  INR 1.22   CBG: Recent Labs  Lab 01/01/18 0732 01/01/18 1150 01/01/18 1633 01/01/18 2120 01/02/18 0805  GLUCAP 186* 196* 207* 187* 195*   Lipid Profile: No results for input(s): CHOL, HDL, LDLCALC, TRIG, CHOLHDL, LDLDIRECT in the last 72 hours. Thyroid Function Tests: No results for input(s): TSH, T4TOTAL, FREET4, T3FREE, THYROIDAB in the last 72 hours. Anemia Panel: No results for input(s): VITAMINB12, FOLATE, FERRITIN, TIBC, IRON, RETICCTPCT in the last 72 hours. Urine analysis:    Component Value Date/Time   COLORURINE YELLOW 12/30/2017 0844   APPEARANCEUR TURBID (A) 12/30/2017 0844   LABSPEC 1.025 12/30/2017 0844   PHURINE 5.5 12/30/2017 0844   GLUCOSEU 100 (A) 12/30/2017 0844   HGBUR LARGE (A) 12/30/2017 0844   BILIRUBINUR MODERATE (A) 12/30/2017 0844   KETONESUR 15 (A) 12/30/2017 0844   PROTEINUR >300 (A) 12/30/2017 0844   NITRITE POSITIVE (A) 12/30/2017 0844   LEUKOCYTESUR LARGE (A) 12/30/2017 0844   Recent Results (from the past 240 hour(s))  Urine Culture     Status: Abnormal   Collection Time: 12/30/17  8:44 AM  Result Value Ref Range Status   Specimen Description URINE, CATHETERIZED  Final   Special Requests   Final    NONE Performed at [facility], [address] [place], [place] [address]    Culture >=100,000 COLONIES/mL ESCHERICHIA COLI (A)  Final   Report Status 01/01/2018 FINAL  Final   Organism ID,  Bacteria ESCHERICHIA COLI (A)  Final      Susceptibility   Escherichia coli - MIC*    AMPICILLIN >=32 RESISTANT Resistant     CEFAZOLIN <=4 SENSITIVE Sensitive     CEFTRIAXONE <=1 SENSITIVE Sensitive     CIPROFLOXACIN <=0.25 SENSITIVE Sensitive     GENTAMICIN <=1 SENSITIVE Sensitive     IMIPENEM <=0.25 SENSITIVE Sensitive     NITROFURANTOIN <=16 SENSITIVE Sensitive     TRIMETH/SULFA >=320 RESISTANT Resistant     AMPICILLIN/SULBACTAM >=32 RESISTANT Resistant     PIP/TAZO <=4 SENSITIVE Sensitive     Extended ESBL NEGATIVE Sensitive     * >=100,000 COLONIES/mL ESCHERICHIA COLI  Blood Culture (routine x 2)     Status: Abnormal (Preliminary result)   Collection Time: 12/30/17  9:08 AM  Result Value Ref Range Status   Specimen Description BLOOD RIGHT ANTECUBITAL  Final   Special Requests   Final    BOTTLES DRAWN AEROBIC AND ANAEROBIC Blood Culture adequate volume   Culture  Setup Time   Final    GRAM NEGATIVE RODS IN BOTH AEROBIC AND ANAEROBIC BOTTLES CRITICAL VALUE NOTED.  VALUE IS CONSISTENT WITH PREVIOUSLY REPORTED AND CALLED VALUE.    Culture (A)  Final    ESCHERICHIA COLI SUSCEPTIBILITIES PERFORMED ON PREVIOUS CULTURE WITHIN THE LAST 5 DAYS. Performed at [facility], [place] [address] [place], [place] [address]    Report Status PENDING  Incomplete  Blood Culture (routine x 2)     Status: Abnormal   Collection Time: 12/30/17  9:13 AM  Result Value Ref Range Status   Specimen Description BLOOD BLOOD LEFT HAND  Final   Special Requests   Final    BOTTLES DRAWN AEROBIC AND ANAEROBIC Blood Culture results may not be optimal due to an inadequate volume of blood received in culture bottles   Culture  Setup Time   Final    GRAM NEGATIVE RODS ANAEROBIC BOTTLE ONLY CRITICAL RESULT CALLED TO, READ BACK BY AND VERIFIED WITH: Denton [facility] 12/31/17 0117 JDW Performed at [facility], [place] [address], [place], [place] [address]    Culture ESCHERICHIA COLI (A)  Final   Report  Status 01/01/2018 FINAL  Final   Organism ID, Bacteria ESCHERICHIA COLI  Final      Susceptibility   Escherichia coli - MIC*    AMPICILLIN >=32 RESISTANT Resistant     CEFAZOLIN <=4 SENSITIVE Sensitive     CEFEPIME <=1 SENSITIVE Sensitive     CEFTAZIDIME <=1 SENSITIVE Sensitive     CEFTRIAXONE <=1 SENSITIVE Sensitive     CIPROFLOXACIN <=0.25 SENSITIVE Sensitive     GENTAMICIN <=1 SENSITIVE Sensitive     IMIPENEM <=0.25 SENSITIVE Sensitive     TRIMETH/SULFA >=320 RESISTANT Resistant     AMPICILLIN/SULBACTAM >=32 RESISTANT Resistant     PIP/TAZO <=4 SENSITIVE Sensitive     Extended ESBL NEGATIVE Sensitive     * ESCHERICHIA COLI  Blood Culture ID Panel (Reflexed)     Status: Abnormal   Collection Time: 12/30/17  9:13 AM  Result Value Ref Range Status   Enterococcus species NOT DETECTED NOT DETECTED Final   Listeria monocytogenes NOT DETECTED NOT DETECTED Final   Staphylococcus species NOT DETECTED NOT DETECTED Final   Staphylococcus aureus NOT DETECTED NOT DETECTED Final   Streptococcus species NOT DETECTED NOT DETECTED Final   Streptococcus agalactiae NOT DETECTED NOT DETECTED Final   Streptococcus pneumoniae NOT DETECTED NOT DETECTED Final   Streptococcus pyogenes NOT DETECTED NOT DETECTED Final   Acinetobacter baumannii NOT DETECTED NOT DETECTED Final   Enterobacteriaceae species DETECTED (A) NOT DETECTED Final    Comment: Enterobacteriaceae represent a large family of gram-negative bacteria, not a single organism. CRITICAL RESULT CALLED TO, READ BACK BY AND VERIFIED WITH: G ABBOTT PHARMD 12/31/17 0117 JDW    Enterobacter cloacae complex NOT DETECTED NOT DETECTED Final   Escherichia coli DETECTED (A) NOT DETECTED Final    Comment: CRITICAL RESULT CALLED TO, READ BACK BY AND VERIFIED WITH: G ABBOTT PHARMD 12/31/17 0117 JDW    Klebsiella oxytoca NOT DETECTED NOT DETECTED Final   Klebsiella pneumoniae NOT DETECTED NOT DETECTED Final   Proteus species NOT DETECTED NOT DETECTED Final     Serratia marcescens NOT DETECTED NOT DETECTED Final   Carbapenem  resistance NOT DETECTED NOT DETECTED Final   Haemophilus influenzae NOT DETECTED NOT DETECTED Final   Neisseria meningitidis NOT DETECTED NOT DETECTED Final   Pseudomonas aeruginosa NOT DETECTED NOT DETECTED Final   Candida albicans NOT DETECTED NOT DETECTED Final   Candida glabrata NOT DETECTED NOT DETECTED Final   Candida krusei NOT DETECTED NOT DETECTED Final   Candida parapsilosis NOT DETECTED NOT DETECTED Final   Candida tropicalis NOT DETECTED NOT DETECTED Final    Comment: Performed at [facility], [place] [address], [place], [place] [address]    Radiology Studies: US Renal  Result Date: 12/31/2017 CLINICAL DATA:  Acute renal failure EXAM: RENAL / URINARY TRACT ULTRASOUND COMPLETE COMPARISON:  None. FINDINGS: Right Kidney: Length: 10.5 cm. No hydronephrosis. 2.3 cm hypoechoic lesion identified in the central portion of the upper right kidney. Left Kidney: Length: 11.6 cm. Echogenicity within normal limits. No mass or hydronephrosis visualized. Bladder: Decompressed. IMPRESSION: 1. No evidence for hydronephrosis. 2. 2.3 cm hypoechoic lesion in the upper pole right kidney. MRI without and with contrast would be the study of choice to further evaluate. Electronically Signed   By: Misty Stanley M.D.   On: 12/31/2017 21:17    Scheduled Meds: . aspirin EC  81 mg Oral Daily  . atorvastatin  40 mg Oral Daily  . carvedilol  6.25 mg Oral BID WC  . enoxaparin (LOVENOX) injection  30 mg Subcutaneous Q24H  . insulin aspart  0-15 Units Subcutaneous TID WC  . insulin aspart  0-5 Units Subcutaneous QHS  . insulin glargine  30 Units Subcutaneous QHS  . sodium chloride flush  3 mL Intravenous Q12H   Continuous Infusions: . 0.9 % NaCl with KCl 20 mEq / L 75 mL/hr at 01/02/18 0602  .  ceFAZolin (ANCEF) IV 1 g (01/02/18 0510)    LOS: 3 days   Adora Yeh, PA-S [facility] 01/02/2018, 9:43 AM    [email or web address] Password [email or web address] If 7PM-7AM, please contact night-coverage

## 2018-01-03 DIAGNOSIS — N179 Acute kidney failure, unspecified: Secondary | ICD-10-CM

## 2018-01-03 DIAGNOSIS — E1165 Type 2 diabetes mellitus with hyperglycemia: Secondary | ICD-10-CM

## 2018-01-03 DIAGNOSIS — I2510 Atherosclerotic heart disease of native coronary artery without angina pectoris: Secondary | ICD-10-CM

## 2018-01-03 DIAGNOSIS — I10 Essential (primary) hypertension: Secondary | ICD-10-CM

## 2018-01-03 DIAGNOSIS — E118 Type 2 diabetes mellitus with unspecified complications: Secondary | ICD-10-CM

## 2018-01-03 DIAGNOSIS — A4151 Sepsis due to Escherichia coli [E. coli]: Principal | ICD-10-CM

## 2018-01-03 LAB — BASIC METABOLIC PANEL WITH GFR
Anion gap: 10 (ref 5–15)
BUN: 28 mg/dL — ABNORMAL HIGH (ref 6–20)
CO2: 22 mmol/L (ref 22–32)
Calcium: 8.4 mg/dL — ABNORMAL LOW (ref 8.9–10.3)
Chloride: 108 mmol/L (ref 98–111)
Creatinine, Ser: 1.77 mg/dL — ABNORMAL HIGH (ref 0.44–1.00)
GFR calc Af Amer: 35 mL/min — ABNORMAL LOW
GFR calc non Af Amer: 30 mL/min — ABNORMAL LOW
Glucose, Bld: 150 mg/dL — ABNORMAL HIGH (ref 70–99)
Potassium: 3.3 mmol/L — ABNORMAL LOW (ref 3.5–5.1)
Sodium: 140 mmol/L (ref 135–145)

## 2018-01-03 LAB — GLUCOSE, CAPILLARY
Glucose-Capillary: 168 mg/dL — ABNORMAL HIGH (ref 70–99)
Glucose-Capillary: 207 mg/dL — ABNORMAL HIGH (ref 70–99)

## 2018-01-03 LAB — CULTURE, BLOOD (ROUTINE X 2): SPECIAL REQUESTS: ADEQUATE

## 2018-01-03 LAB — CBC
HCT: 33.8 % — ABNORMAL LOW (ref 36.0–46.0)
Hemoglobin: 11.5 g/dL — ABNORMAL LOW (ref 12.0–15.0)
MCH: 29.9 pg (ref 26.0–34.0)
MCHC: 34 g/dL (ref 30.0–36.0)
MCV: 88 fL (ref 78.0–100.0)
Platelets: 166 K/uL (ref 150–400)
RBC: 3.84 MIL/uL — ABNORMAL LOW (ref 3.87–5.11)
RDW: 15.1 % (ref 11.5–15.5)
WBC: 12.9 K/uL — ABNORMAL HIGH (ref 4.0–10.5)

## 2018-01-03 MED ORDER — INSULIN GLARGINE 100 UNIT/ML SOLOSTAR PEN: 40.0000 [IU] | PEN_INJECTOR | Freq: Every day | SUBCUTANEOUS | 99 refills | Status: DC

## 2018-01-03 MED ORDER — CIPROFLOXACIN HCL 500 MG PO TABS: 500.0000 mg | ORAL_TABLET | Freq: Two times a day (BID) | ORAL | 0 refills | Status: DC

## 2018-01-03 MED ORDER — SACCHAROMYCES BOULARDII 250 MG PO CAPS: 250.0000 mg | ORAL_CAPSULE | Freq: Two times a day (BID) | ORAL | 0 refills | Status: DC

## 2018-01-03 MED ORDER — CIPROFLOXACIN HCL 500 MG PO TABS
500.0000 mg | ORAL_TABLET | Freq: Two times a day (BID) | ORAL | Status: DC
  Administered 2018-01-03: 500 mg via ORAL
  Filled 2018-01-03: qty 1

## 2018-01-03 MED FILL — LANTUS SOLOSTAR 100 UNITS/M: 100 | 30 days supply | Qty: 12 | Fill #0

## 2018-01-03 MED FILL — CIPROFLOXACIN HCL 500 MG TA: 500 | 4 days supply | Qty: 8 | Fill #0

## 2018-01-03 NOTE — Care Management Note (Signed)
Case Management Note  Patient Details  Name: Kimberly Frazier MRN: [id number] Date of Birth: 31-May-1961  Subjective/Objective:                    Action/Plan:   Expected Discharge Date:  01/03/18               Expected Discharge Plan:  Home/Self Care  In-House Referral:  Financial Counselor  Discharge planning Services  CM Consult, [facility], [facility] Program  Post Acute Care Choice:  NA Choice offered to:  Patient  DME Arranged:  N/A DME Agency:  NA  HH Arranged:  NA HH Agency:  NA  Status of Service:  Completed, signed off  If discussed at Ward of Stay Meetings, dates discussed:    Additional Comments:  Marilu Favre, RN 01/03/2018, 2:36 PM

## 2018-01-03 NOTE — Progress Notes (Signed)
PROGRESS NOTE  Kimberly Frazier  DJT:[id number] DOB: 1961/05/05 DOA: 12/30/2017 PCP: Ladell Pier, MD   Brief Narrative:  Kimberly Frazier is a 57 y.o. female with a past medical history significant for DM2, hyperlipidemia, HTN, and CAD. She presented to the emergency department on on 8/2 with a 2 day history of confusion and dysuria, requiring assistance with iADLs. She had associated back pain, abdominal pain, and one episode of emesis. She was found to have AKI, lactic acid of 2.8, and a UA indicating infection. CT head unremarkable. She was started on Vancomycin and Zosyn and admitted for further evaluation and treatment.  Subjective: Patient is feeling quite well this morning. Her diarrhea is much improved with probiotics, she feels it is more formed and is having less frequent episodes (about 2 this am). No fevers/chills, dysuria, flank pain, abdominal pain, nausea or vomiting. She is endorsing a white tongue and some mild lower extremity edema.   Assessment & Plan:  E. Coli Bacteremia secondary to UTI  - Blood cultures growing EColi, sensitive to cefazolin. Transitioned to cefazolin on 8/4. Transitioned to PO antibiotics ciprofloxacin BID 8/6. - Remained afebrile overnight. WBC down to 12.9 today. - Continue Florastor and prn imodium for diarrhea.   AKI  - Baseline sCr appears to be 0.8. Improved today at 1.77. - Discontinue IVF.  Renal lesion  - Incidental finding from ultrasound. Will eventually need an MRI w/o contrast as an outpatient.   Diabetes Mellitus  - CBGs stable. Continue SSI and lantus.  Hypertension - Hypotensive upon admission. BP now stable. - Continue reduced dose of carvedilol. Hold Statin.   Hypokalemia - Mild. PO supplement   DVT prophylaxis: Lovenox Code Status: FULL Family Communication: No family at bedside  Disposition Plan: She has remained afebrile for 24-48 hours. AKI is revolving, scr is continuing to improve. Taking PO well. Plan to transition  to PO antibiotics today and discharge this afternoon. The patient has an appointment with the wellness center on Friday and will have her renal function checked then.   Consultants:   Pharmacy  Procedures:   US Renal   Antimicrobials:   Vanc and Zosyn x1 8/2  Ceftriaxone 8/2 --> 8/4  Cefazolin 8/4 --> 8/6  Objective: Vitals:   01/02/18 0935 01/02/18 1741 01/02/18 2100 01/03/18 0735  BP: (!) 147/90 131/90 139/80 137/88  Pulse: 86 82 87 83  Resp: 18 20 20 20   Temp: 98.3 F (36.8 C) 98.2 F (36.8 C) 99.2 F (37.3 C) 98.1 F (36.7 C)  TempSrc: Oral Oral Oral Oral  SpO2: 100% 100% 100% 99%  Weight:      Height:        Intake/Output Summary (Last 24 hours) at 01/03/2018 0940 Last data filed at 01/03/2018 0736 Gross per 24 hour  Intake 1787.87 ml  Output 800 ml  Net 987.87 ml   Filed Weights   12/30/17 0800 12/30/17 2102  Weight: 122.5 kg (270 lb) 122.5 kg (270 lb 0.1 oz)    Examination: General appearance: obese adult female laying in bed, awake and alert. NAD.   HEENT: Anicteric, conjunctiva pink, lids and lashes normal. No nasal deformity, discharge, epistaxis.  Lips moist. Superior surface of tongue appears tan/white. Skin: Warm and dry. No jaundice.  No suspicious rashes or lesions. Cardiac: RRR, nl S1-S2, no murmurs appreciated. No JVD. Trace LE edema. No cyanosis. Distal pulses are intact bilaterally.Marland Kitchen Respiratory: Normal respiratory rate and rhythm. CTAB without wheezes, crackles or rales. Abdomen: Abdomen soft and non-distended. No TTP.  No CVA tenderness. No ascites, masses, hepatosplenomegaly appreciated. Positive bowel sounds throughout. MSK: No deformities or effusions. Neuro: AOx3. Moves all extremities. Speech fluent.     Data Reviewed: I have personally reviewed following labs and imaging studies:  CBC: Recent Labs  Lab 12/30/17 0812 12/30/17 0824 12/31/17 0002 01/01/18 0433 01/02/18 0505 01/03/18 0414  WBC 21.0*  --  16.3* 12.8* 13.3* 12.9*    NEUTROABS 18.4*  --   --   --   --   --   HGB 14.1 15.0 12.3 12.0 12.0 11.5*  HCT 41.5 44.0 36.6 35.1* 36.0 33.8*  MCV 87.0  --  88.0 86.5 87.4 88.0  PLT 186  --  115* 132* 149* 619   Basic Metabolic Panel: Recent Labs  Lab 12/30/17 1358 12/31/17 0002 01/01/18 0433 01/02/18 0505 01/03/18 0414  NA 134* 138 136 139 140  K 3.2* 3.1* 3.1* 3.4* 3.3*  CL 99 104 108 110 108  CO2 22 20* 20* 21* 22  GLUCOSE 345* 241* 176* 194* 150*  BUN 41* 44* 43* 34* 28*  CREATININE 2.77* 2.84* 2.46* 2.07* 1.77*  CALCIUM 8.0* 7.9* 7.9* 8.4* 8.4*   GFR: Estimated Creatinine Clearance: 42 mL/min (A) (by C-G formula based on SCr of 1.77 mg/dL (H)). Liver Function Tests: Recent Labs  Lab 12/30/17 0812  AST 38  ALT 18  ALKPHOS 116  BILITOT 3.4*  PROT 6.7  ALBUMIN 2.8*   Coagulation Profile: Recent Labs  Lab 12/30/17 0923  INR 1.22   CBG: Recent Labs  Lab 01/02/18 0805 01/02/18 1203 01/02/18 1642 01/02/18 2138 01/03/18 0744  GLUCAP 195* 213* 259* 197* 168*   Urine analysis:    Component Value Date/Time   COLORURINE YELLOW 12/30/2017 0844   APPEARANCEUR TURBID (A) 12/30/2017 0844   LABSPEC 1.025 12/30/2017 0844   PHURINE 5.5 12/30/2017 0844   GLUCOSEU 100 (A) 12/30/2017 0844   HGBUR LARGE (A) 12/30/2017 0844   BILIRUBINUR MODERATE (A) 12/30/2017 0844   KETONESUR 15 (A) 12/30/2017 0844   PROTEINUR >300 (A) 12/30/2017 0844   NITRITE POSITIVE (A) 12/30/2017 0844   LEUKOCYTESUR LARGE (A) 12/30/2017 0844   Recent Results (from the past 240 hour(s))  Urine Culture     Status: Abnormal   Collection Time: 12/30/17  8:44 AM  Result Value Ref Range Status   Specimen Description URINE, CATHETERIZED  Final   Special Requests   Final    NONE Performed at [facility], [place] [address] [place], [place] [address]    Culture >=100,000 COLONIES/mL ESCHERICHIA COLI (A)  Final   Report Status 01/01/2018 FINAL  Final   Organism ID, Bacteria ESCHERICHIA COLI (A)  Final       Susceptibility   Escherichia coli - MIC*    AMPICILLIN >=32 RESISTANT Resistant     CEFAZOLIN <=4 SENSITIVE Sensitive     CEFTRIAXONE <=1 SENSITIVE Sensitive     CIPROFLOXACIN <=0.25 SENSITIVE Sensitive     GENTAMICIN <=1 SENSITIVE Sensitive     IMIPENEM <=0.25 SENSITIVE Sensitive     NITROFURANTOIN <=16 SENSITIVE Sensitive     TRIMETH/SULFA >=320 RESISTANT Resistant     AMPICILLIN/SULBACTAM >=32 RESISTANT Resistant     PIP/TAZO <=4 SENSITIVE Sensitive     Extended ESBL NEGATIVE Sensitive     * >=100,000 COLONIES/mL ESCHERICHIA COLI  Blood Culture (routine x 2)     Status: Abnormal   Collection Time: 12/30/17  9:08 AM  Result Value Ref Range Status   Specimen Description BLOOD RIGHT ANTECUBITAL  Final   Special Requests   Final    BOTTLES DRAWN AEROBIC AND ANAEROBIC Blood Culture adequate volume   Culture  Setup Time   Final    GRAM NEGATIVE RODS IN BOTH AEROBIC AND ANAEROBIC BOTTLES CRITICAL VALUE NOTED.  VALUE IS CONSISTENT WITH PREVIOUSLY REPORTED AND CALLED VALUE.    Culture (A)  Final    ESCHERICHIA COLI SUSCEPTIBILITIES PERFORMED ON PREVIOUS CULTURE WITHIN THE LAST 5 DAYS. Performed at [facility], [place] [address], [place], [place] [address]    Report Status 01/03/2018 FINAL  Final  Blood Culture (routine x 2)     Status: Abnormal   Collection Time: 12/30/17  9:13 AM  Result Value Ref Range Status   Specimen Description BLOOD BLOOD LEFT HAND  Final   Special Requests   Final    BOTTLES DRAWN AEROBIC AND ANAEROBIC Blood Culture results may not be optimal due to an inadequate volume of blood received in culture bottles   Culture  Setup Time   Final    GRAM NEGATIVE RODS IN BOTH AEROBIC AND ANAEROBIC BOTTLES CRITICAL RESULT CALLED TO, READ BACK BY AND VERIFIED WITH: Denton Brick [facility] 12/31/17 0117 JDW Performed at [facility], [place] [address] [place], [place] [address]    Culture ESCHERICHIA COLI (A)  Final   Report Status 01/01/2018 FINAL  Final   Organism  ID, Bacteria ESCHERICHIA COLI  Final      Susceptibility   Escherichia coli - MIC*    AMPICILLIN >=32 RESISTANT Resistant     CEFAZOLIN <=4 SENSITIVE Sensitive     CEFEPIME <=1 SENSITIVE Sensitive     CEFTAZIDIME <=1 SENSITIVE Sensitive     CEFTRIAXONE <=1 SENSITIVE Sensitive     CIPROFLOXACIN <=0.25 SENSITIVE Sensitive     GENTAMICIN <=1 SENSITIVE Sensitive     IMIPENEM <=0.25 SENSITIVE Sensitive     TRIMETH/SULFA >=320 RESISTANT Resistant     AMPICILLIN/SULBACTAM >=32 RESISTANT Resistant     PIP/TAZO <=4 SENSITIVE Sensitive     Extended ESBL NEGATIVE Sensitive     * ESCHERICHIA COLI  Blood Culture ID Panel (Reflexed)     Status: Abnormal   Collection Time: 12/30/17  9:13 AM  Result Value Ref Range Status   Enterococcus species NOT DETECTED NOT DETECTED Final   Listeria monocytogenes NOT DETECTED NOT DETECTED Final   Staphylococcus species NOT DETECTED NOT DETECTED Final   Staphylococcus aureus NOT DETECTED NOT DETECTED Final   Streptococcus species NOT DETECTED NOT DETECTED Final   Streptococcus agalactiae NOT DETECTED NOT DETECTED Final   Streptococcus pneumoniae NOT DETECTED NOT DETECTED Final   Streptococcus pyogenes NOT DETECTED NOT DETECTED Final   Acinetobacter baumannii NOT DETECTED NOT DETECTED Final   Enterobacteriaceae species DETECTED (A) NOT DETECTED Final    Comment: Enterobacteriaceae represent a large family of gram-negative bacteria, not a single organism. CRITICAL RESULT CALLED TO, READ BACK BY AND VERIFIED WITH: G ABBOTT PHARMD 12/31/17 0117 JDW    Enterobacter cloacae complex NOT DETECTED NOT DETECTED Final   Escherichia coli DETECTED (A) NOT DETECTED Final    Comment: CRITICAL RESULT CALLED TO, READ BACK BY AND VERIFIED WITH: G ABBOTT PHARMD 12/31/17 0117 JDW    Klebsiella oxytoca NOT DETECTED NOT DETECTED Final   Klebsiella pneumoniae NOT DETECTED NOT DETECTED Final   Proteus species NOT DETECTED NOT DETECTED Final   Serratia marcescens NOT DETECTED NOT  DETECTED Final   Carbapenem resistance NOT DETECTED NOT DETECTED Final   Haemophilus influenzae NOT DETECTED NOT  DETECTED Final   Neisseria meningitidis NOT DETECTED NOT DETECTED Final   Pseudomonas aeruginosa NOT DETECTED NOT DETECTED Final   Candida albicans NOT DETECTED NOT DETECTED Final   Candida glabrata NOT DETECTED NOT DETECTED Final   Candida krusei NOT DETECTED NOT DETECTED Final   Candida parapsilosis NOT DETECTED NOT DETECTED Final   Candida tropicalis NOT DETECTED NOT DETECTED Final    Comment: Performed at [facility], [place] [address], [place], [place] [address]    Radiology Studies: No results found.  Scheduled Meds: . aspirin EC  81 mg Oral Daily  . atorvastatin  40 mg Oral Daily  . carvedilol  6.25 mg Oral BID WC  . enoxaparin (LOVENOX) injection  60 mg Subcutaneous Q24H  . insulin aspart  0-20 Units Subcutaneous TID WC  . insulin aspart  0-5 Units Subcutaneous QHS  . insulin glargine  34 Units Subcutaneous QHS  . saccharomyces boulardii  250 mg Oral BID  . sodium chloride flush  3 mL Intravenous Q12H   Continuous Infusions: . 0.9 % NaCl with KCl 20 mEq / L Stopped (01/03/18 0722)  .  ceFAZolin (ANCEF) IV 1 g (01/02/18 2146)    LOS: 4 days   Thunder [place], PA-S [facility] 01/03/2018, 9:40 AM   [email or web address] Password [email or web address] If 7PM-7AM, please contact night-coverage

## 2018-01-03 NOTE — Discharge Summary (Signed)
Physician Discharge Summary  Kimberly Frazier VVO:[id number] DOB: 05-27-1961 DOA: 12/30/2017  PCP: Ladell Pier, MD  Admit date: 12/30/2017 Discharge date: 01/03/2018  Admitted From: Home  Disposition:  Home   Recommendations for Outpatient Follow-up:  1. Follow up with PCP in 3 days 2. Please obtain BMP in 3 days 3. Please refer patient for renal MRI to follow up nodule on RIGHT kidney   Home Health: None  Equipment/Devices: None  Discharge Condition: Good  CODE STATUS: FULL Diet recommendation: Cardiac, diabetic  Brief/Interim Summary: Kimberly Frazier is a 57 y.o. F with HTN and DM who presents with 1 day malaise, confusion, shakes.  Found to have acute kidney injury, lactate 2.8, delirium, urinalysis suggestive of UTI.  CT head unremarkable.  Started on broad-spectrum antibiotics.       Discharge Diagnoses:   Sepsis from E. coli Presented with confusion, hypotension, elevated lactic acid.  Given 30 cc/kg IV fluids, lactate cleared.  Blood cultures growing E. coli in 2 of 2 bottles.  Of note, patient had had Cipro script called in for her 1 week before presentation, but didn't get voicemail.  Afebrile since HD1.  White blood cell count trended down.  Treated with ceftriaxone transitioned to cefazolin.  Discharged to complete 7 days with Cipro.   Acute renal failure Baseline Cr 0.8 last fall, admitted with creatinine more than tripled to 2.8.  FeNA 0.3, improving with fluids, still not back to baseline, but UOP good, trend good.  Has close PCP follow up.   Renal lesion upper pole right kidney Incidental finding on US renal.  Will need Non-emergent abdomen MRI with and without contrast.  Thrombocytopenia Mild, lowest 115K, from sepsis.  Improving.  Diabetes No change to regimen  Hypertension Hypotensive at admission.  BP now resolved.  Losartan held at discharge.      Hypokalemia Resolved  Diarrhea Diarrhea with antibiotics, started on  Florastor.       Discharge Instructions  Discharge Instructions    Diet - low sodium heart healthy   Complete by:  As directed    Discharge instructions   Complete by:  As directed    From Dr. Loleta Books: You were admitted for sepsis from a bladder infection. You were also found to have kidney injury.  For the infection:  You were treated with antibiotics here. You should take ciprofloxacin/Cipro 500 mg twice daily (once in the morning, once at night) until it is gone, starting tonight, Tuesday. While you are taking the antibiotic, take a probiotic (healthy bacteria) daily.   Continue the probiotic until one week after you finish the cipro If you don't know where to get a probiotic, ask the [facility] and [facility].   Your kidney injury is improving. You should NOT take any NSAID pain relievers.  This includes ibuprofen, Motrin, Advil, naproxen, Aleve, meloxicam, Mobic, or ANY OTHER pain medicine labeled as a "NSAID"  For now, take all your home blood pressure medicines except Cozaar. DO NOT TAKE losartan/Cozaar.   Follow up with Dr. Wynetta Emery on Friday at 10:30a.  She will check your blood work. Drink plenty of fluids.   For your diabetes: You only needed about 40 units of Lantus per day here, so when you go home, take only 40 units of Lantus daily Resume your normal Humalog If your morning sugars are higher than 200, increase your Lantus slowly back to your home dose (increase by a few units per day until the mroning sugar is less than 200).  Increase activity slowly   Complete by:  As directed      Allergies as of 01/03/2018   No Known Allergies     Medication List    STOP taking these medications   losartan 25 MG tablet Commonly known as:  COZAAR   meloxicam 15 MG tablet Commonly known as:  MOBIC     TAKE these medications   aspirin EC 81 MG tablet Take 1 tablet (81 mg total) daily by mouth.   atorvastatin 40 MG tablet Commonly known as:   LIPITOR Take 1 tablet (40 mg total) daily by mouth.   BD PEN NEEDLE NANO U/F 32G X 4 MM Misc Generic drug:  Insulin Pen Needle USE TWICE DAILY AS DIRECTED   Insulin Pen Needle 32G X 4 MM Misc Commonly known as:  ULTICARE MICRO PEN NEEDLES Use as directed with insulin   carvedilol 25 MG tablet Commonly known as:  COREG Take 1 tablet (25 mg total) by mouth 2 (two) times daily. *Patient is overdue for an appt. Please call and schedule for further refills*   ciprofloxacin 500 MG tablet Commonly known as:  CIPRO Take 1 tablet (500 mg total) by mouth 2 (two) times daily. What changed:  when to take this   glucose blood test strip Commonly known as:  FREESTYLE LITE Use as instructed   glucose blood test strip Commonly known as:  TRUE METRIX BLOOD GLUCOSE TEST Use as instructed   insulin aspart 100 UNIT/ML FlexPen Commonly known as:  NOVOLOG FLEXPEN Inject 25 Units into the skin 3 (three) times daily with meals.   Insulin Glargine 100 UNIT/ML Solostar Pen Commonly known as:  LANTUS SOLOSTAR Inject 40 Units into the skin at bedtime. What changed:  how much to take   insulin lispro 100 UNIT/ML injection Commonly known as:  HUMALOG Inject 25 Units into the skin 3 (three) times daily before meals.   nitroGLYCERIN 0.4 MG SL tablet Commonly known as:  NITROSTAT Place 1 tab under tongue for chest pain.  May repeat after 5 minutes x 2.  DO NOT TAKE MORE THAN 3 TABS DURING AN EPISODE OF CHEST PAIN   potassium chloride 8 MEQ tablet Commonly known as:  KLOR-CON Take 2 tablets (16 mEq total) by mouth daily.   saccharomyces boulardii 250 MG capsule Commonly known as:  FLORASTOR Take 1 capsule (250 mg total) by mouth 2 (two) times daily.   TRUE METRIX METER w/Device Kit Use as directed   TRUEPLUS LANCETS 28G Misc Use as directted      Follow-up Information    [place] In 3 days.   Why:  Appointment with Karle Plumber for [facility] lab date on  01/06/2018 at 10:30am. Please arrive at 10:00 for necessary paperwork. Thank you.  Contact information: [place] [phone]         No Known Allergies  Consultations:  None   Procedures/Studies: Ct Head Wo Contrast  Result Date: 12/30/2017 CLINICAL DATA:  Increased weakness and decreased appetite. Disoriented. EXAM: CT HEAD WITHOUT CONTRAST TECHNIQUE: Contiguous axial images were obtained from the base of the skull through the vertex without intravenous contrast. COMPARISON:  02/19/2016; 06/16/2014 FINDINGS: Brain: Mild atrophy with volume loss and prominence of the bifrontal extra-axial spaces. Gray-white differentiation is maintained. No CT evidence of acute large territory infarct. No intraparenchymal or extra-axial mass or hemorrhage. Normal size and configuration of the ventricles and the basilar cisterns. No midline shift. Vascular: No  hyperdense vessel or unexpected calcification. Skull: No displaced calvarial fracture Sinuses/Orbits: Polypoid mucosal thickening of the left maxillary sinus. The remaining paranasal sinuses and mastoid air cells are normally aerated. No air-fluid levels. Other: Regional soft tissues appear normal. IMPRESSION: Similar findings of mild atrophy without superimposed acute intracranial process. Electronically Signed   By: Sandi Mariscal M.D.   On: 12/30/2017 08:51   US Renal  Result Date: 12/31/2017 CLINICAL DATA:  Acute renal failure EXAM: RENAL / URINARY TRACT ULTRASOUND COMPLETE COMPARISON:  None. FINDINGS: Right Kidney: Length: 10.5 cm. No hydronephrosis. 2.3 cm hypoechoic lesion identified in the central portion of the upper right kidney. Left Kidney: Length: 11.6 cm. Echogenicity within normal limits. No mass or hydronephrosis visualized. Bladder: Decompressed. IMPRESSION: 1. No evidence for hydronephrosis. 2. 2.3 cm hypoechoic lesion in the upper pole right kidney. MRI without and with contrast would be the  study of choice to further evaluate. Electronically Signed   By: Misty Stanley M.D.   On: 12/31/2017 21:17   Dg Chest Port 1 View  Result Date: 12/30/2017 CLINICAL DATA:  Leukocytosis. EXAM: PORTABLE CHEST 1 VIEW COMPARISON:  None. FINDINGS: Cardiac enlargement without heart failure. Negative for pneumonia or effusion. Lungs are clear. IMPRESSION: Cardiac enlargement without acute abnormality. Electronically Signed   By: Franchot Gallo M.D.   On: 12/30/2017 08:56       Subjective: Feeling well, no confusion, headache, vomiting. No dizziness.  No flank pain, weakness.  Discharge Exam: Vitals:   01/02/18 2100 01/03/18 0735  BP: 139/80 137/88  Pulse: 87 83  Resp: 20 20  Temp: 99.2 F (37.3 C) 98.1 F (36.7 C)  SpO2: 100% 99%   Vitals:   01/02/18 0935 01/02/18 1741 01/02/18 2100 01/03/18 0735  BP: (!) 147/90 131/90 139/80 137/88  Pulse: 86 82 87 83  Resp: _0 Temp: 98.3 F (36.8 C) 98.2 F (36.8 C) 99.2 F (37.3 C) 98.1 F (36.7 C)  TempSrc: Oral Oral Oral Oral  SpO2: 100% 100% 100% 99%  Weight:      Height:        General: Pt is alert, awake, not in acute distress, sitting in chair Cardiovascular: RRR, S1/S2 +, no rubs, no gallops Respiratory: CTA bilaterally, no wheezing, no rhonchi Abdominal: Soft, NT, ND, bowel sounds + Extremities: no edema, no cyanosis    The results of significant diagnostics from this hospitalization (including imaging, microbiology, ancillary and laboratory) are listed below for reference.     Microbiology: Recent Results (from the past 240 hour(s))  Urine Culture     Status: Abnormal   Collection Time: 12/30/17  8:44 AM  Result Value Ref Range Status   Specimen Description URINE, CATHETERIZED  Final   Special Requests   Final    NONE Performed at [facility], [address] [place], [place] [address]    Culture >=100,000 COLONIES/mL ESCHERICHIA COLI (A)  Final   Report Status 01/01/2018 FINAL  Final   Organism ID,  Bacteria ESCHERICHIA COLI (A)  Final      Susceptibility   Escherichia coli - MIC*    AMPICILLIN >=32 RESISTANT Resistant     CEFAZOLIN <=4 SENSITIVE Sensitive     CEFTRIAXONE <=1 SENSITIVE Sensitive     CIPROFLOXACIN <=0.25 SENSITIVE Sensitive     GENTAMICIN <=1 SENSITIVE Sensitive     IMIPENEM <=0.25 SENSITIVE Sensitive     NITROFURANTOIN <=16 SENSITIVE Sensitive     TRIMETH/SULFA >=320 RESISTANT Resistant     AMPICILLIN/SULBACTAM >=  32 RESISTANT Resistant     PIP/TAZO <=4 SENSITIVE Sensitive     Extended ESBL NEGATIVE Sensitive     * >=100,000 COLONIES/mL ESCHERICHIA COLI  Blood Culture (routine x 2)     Status: Abnormal   Collection Time: 12/30/17  9:08 AM  Result Value Ref Range Status   Specimen Description BLOOD RIGHT ANTECUBITAL  Final   Special Requests   Final    BOTTLES DRAWN AEROBIC AND ANAEROBIC Blood Culture adequate volume   Culture  Setup Time   Final    GRAM NEGATIVE RODS IN BOTH AEROBIC AND ANAEROBIC BOTTLES CRITICAL VALUE NOTED.  VALUE IS CONSISTENT WITH PREVIOUSLY REPORTED AND CALLED VALUE.    Culture (A)  Final    ESCHERICHIA COLI SUSCEPTIBILITIES PERFORMED ON PREVIOUS CULTURE WITHIN THE LAST 5 DAYS. Performed at [facility], [place] [address], [place], [place] [address]    Report Status 01/03/2018 FINAL  Final  Blood Culture (routine x 2)     Status: Abnormal   Collection Time: 12/30/17  9:13 AM  Result Value Ref Range Status   Specimen Description BLOOD BLOOD LEFT HAND  Final   Special Requests   Final    BOTTLES DRAWN AEROBIC AND ANAEROBIC Blood Culture results may not be optimal due to an inadequate volume of blood received in culture bottles   Culture  Setup Time   Final    GRAM NEGATIVE RODS IN BOTH AEROBIC AND ANAEROBIC BOTTLES CRITICAL RESULT CALLED TO, READ BACK BY AND VERIFIED WITH: Denton Brick [facility] 12/31/17 0117 JDW Performed at [facility], [place] [address] [place], [place] [address]    Culture ESCHERICHIA COLI (A)  Final   Report  Status 01/01/2018 FINAL  Final   Organism ID, Bacteria ESCHERICHIA COLI  Final      Susceptibility   Escherichia coli - MIC*    AMPICILLIN >=32 RESISTANT Resistant     CEFAZOLIN <=4 SENSITIVE Sensitive     CEFEPIME <=1 SENSITIVE Sensitive     CEFTAZIDIME <=1 SENSITIVE Sensitive     CEFTRIAXONE <=1 SENSITIVE Sensitive     CIPROFLOXACIN <=0.25 SENSITIVE Sensitive     GENTAMICIN <=1 SENSITIVE Sensitive     IMIPENEM <=0.25 SENSITIVE Sensitive     TRIMETH/SULFA >=320 RESISTANT Resistant     AMPICILLIN/SULBACTAM >=32 RESISTANT Resistant     PIP/TAZO <=4 SENSITIVE Sensitive     Extended ESBL NEGATIVE Sensitive     * ESCHERICHIA COLI  Blood Culture ID Panel (Reflexed)     Status: Abnormal   Collection Time: 12/30/17  9:13 AM  Result Value Ref Range Status   Enterococcus species NOT DETECTED NOT DETECTED Final   Listeria monocytogenes NOT DETECTED NOT DETECTED Final   Staphylococcus species NOT DETECTED NOT DETECTED Final   Staphylococcus aureus NOT DETECTED NOT DETECTED Final   Streptococcus species NOT DETECTED NOT DETECTED Final   Streptococcus agalactiae NOT DETECTED NOT DETECTED Final   Streptococcus pneumoniae NOT DETECTED NOT DETECTED Final   Streptococcus pyogenes NOT DETECTED NOT DETECTED Final   Acinetobacter baumannii NOT DETECTED NOT DETECTED Final   Enterobacteriaceae species DETECTED (A) NOT DETECTED Final    Comment: Enterobacteriaceae represent a large family of gram-negative bacteria, not a single organism. CRITICAL RESULT CALLED TO, READ BACK BY AND VERIFIED WITH: G ABBOTT PHARMD 12/31/17 0117 JDW    Enterobacter cloacae complex NOT DETECTED NOT DETECTED Final   Escherichia coli DETECTED (A) NOT DETECTED Final    Comment: CRITICAL RESULT CALLED TO, READ BACK BY AND  VERIFIED WITH: G ABBOTT PHARMD 12/31/17 0117 JDW    Klebsiella oxytoca NOT DETECTED NOT DETECTED Final   Klebsiella pneumoniae NOT DETECTED NOT DETECTED Final   Proteus species NOT DETECTED NOT DETECTED Final    Serratia marcescens NOT DETECTED NOT DETECTED Final   Carbapenem resistance NOT DETECTED NOT DETECTED Final   Haemophilus influenzae NOT DETECTED NOT DETECTED Final   Neisseria meningitidis NOT DETECTED NOT DETECTED Final   Pseudomonas aeruginosa NOT DETECTED NOT DETECTED Final   Candida albicans NOT DETECTED NOT DETECTED Final   Candida glabrata NOT DETECTED NOT DETECTED Final   Candida krusei NOT DETECTED NOT DETECTED Final   Candida parapsilosis NOT DETECTED NOT DETECTED Final   Candida tropicalis NOT DETECTED NOT DETECTED Final    Comment: Performed at [facility], [place] [address], [place], [place] [address]     Labs: BNP (last 3 results) No results for input(s): BNP in the last 8760 hours. Basic Metabolic Panel: Recent Labs  Lab 12/30/17 1358 12/31/17 0002 01/01/18 0433 01/02/18 0505 01/03/18 0414  NA 134* 138 136 139 140  K 3.2* 3.1* 3.1* 3.4* 3.3*  CL 99 104 108 110 108  CO2 22 20* 20* 21* 22  GLUCOSE 345* 241* 176* 194* 150*  BUN 41* 44* 43* 34* 28*  CREATININE 2.77* 2.84* 2.46* 2.07* 1.77*  CALCIUM 8.0* 7.9* 7.9* 8.4* 8.4*   Liver Function Tests: Recent Labs  Lab 12/30/17 0812  AST 38  ALT 18  ALKPHOS 116  BILITOT 3.4*  PROT 6.7  ALBUMIN 2.8*   No results for input(s): LIPASE, AMYLASE in the last 168 hours. No results for input(s): AMMONIA in the last 168 hours. CBC: Recent Labs  Lab 12/30/17 0812 12/30/17 0824 12/31/17 0002 01/01/18 0433 01/02/18 0505 01/03/18 0414  WBC 21.0*  --  16.3* 12.8* 13.3* 12.9*  NEUTROABS 18.4*  --   --   --   --   --   HGB 14.1 15.0 12.3 12.0 12.0 11.5*  HCT 41.5 44.0 36.6 35.1* 36.0 33.8*  MCV 87.0  --  88.0 86.5 87.4 88.0  PLT 186  --  115* 132* 149* 166   Cardiac Enzymes: No results for input(s): CKTOTAL, CKMB, CKMBINDEX, TROPONINI in the last 168 hours. BNP: Invalid input(s): POCBNP CBG: Recent Labs  Lab 01/02/18 1203 01/02/18 1642 01/02/18 2138 01/03/18 0744 01/03/18 1132  GLUCAP 213* 259*  197* 168* 207*   D-Dimer No results for input(s): DDIMER in the last 72 hours. Hgb A1c No results for input(s): HGBA1C in the last 72 hours. Lipid Profile No results for input(s): CHOL, HDL, LDLCALC, TRIG, CHOLHDL, LDLDIRECT in the last 72 hours. Thyroid function studies No results for input(s): TSH, T4TOTAL, T3FREE, THYROIDAB in the last 72 hours.  Invalid input(s): FREET3 Anemia work up No results for input(s): VITAMINB12, FOLATE, FERRITIN, TIBC, IRON, RETICCTPCT in the last 72 hours. Urinalysis    Component Value Date/Time   COLORURINE YELLOW 12/30/2017 0844   APPEARANCEUR TURBID (A) 12/30/2017 0844   LABSPEC 1.025 12/30/2017 0844   PHURINE 5.5 12/30/2017 0844   GLUCOSEU 100 (A) 12/30/2017 0844   HGBUR LARGE (A) 12/30/2017 0844   BILIRUBINUR MODERATE (A) 12/30/2017 0844   KETONESUR 15 (A) 12/30/2017 0844   PROTEINUR >300 (A) 12/30/2017 0844   NITRITE POSITIVE (A) 12/30/2017 0844   LEUKOCYTESUR LARGE (A) 12/30/2017 0844   Sepsis Labs Invalid input(s): PROCALCITONIN,  WBC,  LACTICIDVEN Microbiology Recent Results (from the past 240 hour(s))  Urine Culture  Status: Abnormal   Collection Time: 12/30/17  8:44 AM  Result Value Ref Range Status   Specimen Description URINE, CATHETERIZED  Final   Special Requests   Final    NONE Performed at [facility], [address] [place], [place] [address]    Culture >=100,000 COLONIES/mL ESCHERICHIA COLI (A)  Final   Report Status 01/01/2018 FINAL  Final   Organism ID, Bacteria ESCHERICHIA COLI (A)  Final      Susceptibility   Escherichia coli - MIC*    AMPICILLIN >=32 RESISTANT Resistant     CEFAZOLIN <=4 SENSITIVE Sensitive     CEFTRIAXONE <=1 SENSITIVE Sensitive     CIPROFLOXACIN <=0.25 SENSITIVE Sensitive     GENTAMICIN <=1 SENSITIVE Sensitive     IMIPENEM <=0.25 SENSITIVE Sensitive     NITROFURANTOIN <=16 SENSITIVE Sensitive     TRIMETH/SULFA >=320 RESISTANT Resistant     AMPICILLIN/SULBACTAM >=32 RESISTANT  Resistant     PIP/TAZO <=4 SENSITIVE Sensitive     Extended ESBL NEGATIVE Sensitive     * >=100,000 COLONIES/mL ESCHERICHIA COLI  Blood Culture (routine x 2)     Status: Abnormal   Collection Time: 12/30/17  9:08 AM  Result Value Ref Range Status   Specimen Description BLOOD RIGHT ANTECUBITAL  Final   Special Requests   Final    BOTTLES DRAWN AEROBIC AND ANAEROBIC Blood Culture adequate volume   Culture  Setup Time   Final    GRAM NEGATIVE RODS IN BOTH AEROBIC AND ANAEROBIC BOTTLES CRITICAL VALUE NOTED.  VALUE IS CONSISTENT WITH PREVIOUSLY REPORTED AND CALLED VALUE.    Culture (A)  Final    ESCHERICHIA COLI SUSCEPTIBILITIES PERFORMED ON PREVIOUS CULTURE WITHIN THE LAST 5 DAYS. Performed at [facility], [place] [address], [place], [place] [address]    Report Status 01/03/2018 FINAL  Final  Blood Culture (routine x 2)     Status: Abnormal   Collection Time: 12/30/17  9:13 AM  Result Value Ref Range Status   Specimen Description BLOOD BLOOD LEFT HAND  Final   Special Requests   Final    BOTTLES DRAWN AEROBIC AND ANAEROBIC Blood Culture results may not be optimal due to an inadequate volume of blood received in culture bottles   Culture  Setup Time   Final    GRAM NEGATIVE RODS IN BOTH AEROBIC AND ANAEROBIC BOTTLES CRITICAL RESULT CALLED TO, READ BACK BY AND VERIFIED WITH: Denton Brick [facility] 12/31/17 0117 JDW Performed at [facility], [place] [address], [place], [place] [address]    Culture ESCHERICHIA COLI (A)  Final   Report Status 01/01/2018 FINAL  Final   Organism ID, Bacteria ESCHERICHIA COLI  Final      Susceptibility   Escherichia coli - MIC*    AMPICILLIN >=32 RESISTANT Resistant     CEFAZOLIN <=4 SENSITIVE Sensitive     CEFEPIME <=1 SENSITIVE Sensitive     CEFTAZIDIME <=1 SENSITIVE Sensitive     CEFTRIAXONE <=1 SENSITIVE Sensitive     CIPROFLOXACIN <=0.25 SENSITIVE Sensitive     GENTAMICIN <=1 SENSITIVE Sensitive     IMIPENEM <=0.25 SENSITIVE Sensitive      TRIMETH/SULFA >=320 RESISTANT Resistant     AMPICILLIN/SULBACTAM >=32 RESISTANT Resistant     PIP/TAZO <=4 SENSITIVE Sensitive     Extended ESBL NEGATIVE Sensitive     * ESCHERICHIA COLI  Blood Culture ID Panel (Reflexed)     Status: Abnormal   Collection Time: 12/30/17  9:13 AM  Result  Value Ref Range Status   Enterococcus species NOT DETECTED NOT DETECTED Final   Listeria monocytogenes NOT DETECTED NOT DETECTED Final   Staphylococcus species NOT DETECTED NOT DETECTED Final   Staphylococcus aureus NOT DETECTED NOT DETECTED Final   Streptococcus species NOT DETECTED NOT DETECTED Final   Streptococcus agalactiae NOT DETECTED NOT DETECTED Final   Streptococcus pneumoniae NOT DETECTED NOT DETECTED Final   Streptococcus pyogenes NOT DETECTED NOT DETECTED Final   Acinetobacter baumannii NOT DETECTED NOT DETECTED Final   Enterobacteriaceae species DETECTED (A) NOT DETECTED Final    Comment: Enterobacteriaceae represent a large family of gram-negative bacteria, not a single organism. CRITICAL RESULT CALLED TO, READ BACK BY AND VERIFIED WITH: G ABBOTT PHARMD 12/31/17 0117 JDW    Enterobacter cloacae complex NOT DETECTED NOT DETECTED Final   Escherichia coli DETECTED (A) NOT DETECTED Final    Comment: CRITICAL RESULT CALLED TO, READ BACK BY AND VERIFIED WITH: G ABBOTT PHARMD 12/31/17 0117 JDW    Klebsiella oxytoca NOT DETECTED NOT DETECTED Final   Klebsiella pneumoniae NOT DETECTED NOT DETECTED Final   Proteus species NOT DETECTED NOT DETECTED Final   Serratia marcescens NOT DETECTED NOT DETECTED Final   Carbapenem resistance NOT DETECTED NOT DETECTED Final   Haemophilus influenzae NOT DETECTED NOT DETECTED Final   Neisseria meningitidis NOT DETECTED NOT DETECTED Final   Pseudomonas aeruginosa NOT DETECTED NOT DETECTED Final   Candida albicans NOT DETECTED NOT DETECTED Final   Candida glabrata NOT DETECTED NOT DETECTED Final   Candida krusei NOT DETECTED NOT DETECTED Final   Candida  parapsilosis NOT DETECTED NOT DETECTED Final   Candida tropicalis NOT DETECTED NOT DETECTED Final    Comment: Performed at [facility], [place]. [address] [place], [place] [address]     Time coordinating discharge: 25 minutes       SIGNED:   Edwin Dada, MD  [facility] 01/03/2018, 10:19 PM

## 2018-01-03 NOTE — Progress Notes (Signed)
Patient Discharge: Disposition:  Patient discharged to home. Education: Reviewed medication, prescriptions, follow-up appointments and discharge instructions, verbalized understanding. IV: N/A Telemetry: N/A Transportation: Patient escorted out of the unit in w/c.   Belongings: patient took all her belongings with her.

## 2018-01-06 ENCOUNTER — Ambulatory Visit: Payer: Self-pay | Attending: Internal Medicine

## 2018-01-06 ENCOUNTER — Ambulatory Visit: Payer: Self-pay

## 2018-01-06 DIAGNOSIS — R300 Dysuria: Secondary | ICD-10-CM | POA: Insufficient documentation

## 2018-01-06 NOTE — Progress Notes (Signed)
Patient here for lab visit only 

## 2018-01-07 LAB — URINALYSIS
Bilirubin, UA: NEGATIVE
Glucose, UA: NEGATIVE
Ketones, UA: NEGATIVE
Nitrite, UA: NEGATIVE
PH UA: 5 (ref 5.0–7.5)
Specific Gravity, UA: 1.013 (ref 1.005–1.030)
Urobilinogen, Ur: 0.2 mg/dL (ref 0.2–1.0)

## 2018-01-08 LAB — URINE CULTURE: ORGANISM ID, BACTERIA: NO GROWTH

## 2018-01-09 ENCOUNTER — Ambulatory Visit: Payer: Self-pay | Attending: Internal Medicine | Admitting: Internal Medicine

## 2018-01-09 ENCOUNTER — Encounter: Payer: Self-pay | Admitting: Internal Medicine

## 2018-01-09 VITALS — BP 133/87 | HR 101 | Temp 99.4°F | Resp 16 | Wt 259.8 lb

## 2018-01-09 DIAGNOSIS — N2889 Other specified disorders of kidney and ureter: Secondary | ICD-10-CM

## 2018-01-09 DIAGNOSIS — I10 Essential (primary) hypertension: Secondary | ICD-10-CM

## 2018-01-09 DIAGNOSIS — E1140 Type 2 diabetes mellitus with diabetic neuropathy, unspecified: Secondary | ICD-10-CM | POA: Insufficient documentation

## 2018-01-09 DIAGNOSIS — Z794 Long term (current) use of insulin: Secondary | ICD-10-CM | POA: Insufficient documentation

## 2018-01-09 DIAGNOSIS — E875 Hyperkalemia: Secondary | ICD-10-CM | POA: Insufficient documentation

## 2018-01-09 DIAGNOSIS — N179 Acute kidney failure, unspecified: Secondary | ICD-10-CM

## 2018-01-09 DIAGNOSIS — Z7982 Long term (current) use of aspirin: Secondary | ICD-10-CM | POA: Insufficient documentation

## 2018-01-09 DIAGNOSIS — E785 Hyperlipidemia, unspecified: Secondary | ICD-10-CM | POA: Insufficient documentation

## 2018-01-09 DIAGNOSIS — Z79899 Other long term (current) drug therapy: Secondary | ICD-10-CM | POA: Insufficient documentation

## 2018-01-09 DIAGNOSIS — E118 Type 2 diabetes mellitus with unspecified complications: Secondary | ICD-10-CM

## 2018-01-09 DIAGNOSIS — A4151 Sepsis due to Escherichia coli [E. coli]: Secondary | ICD-10-CM

## 2018-01-09 DIAGNOSIS — E1165 Type 2 diabetes mellitus with hyperglycemia: Secondary | ICD-10-CM

## 2018-01-09 DIAGNOSIS — R51 Headache: Secondary | ICD-10-CM | POA: Insufficient documentation

## 2018-01-09 DIAGNOSIS — I119 Hypertensive heart disease without heart failure: Secondary | ICD-10-CM | POA: Insufficient documentation

## 2018-01-09 DIAGNOSIS — I2510 Atherosclerotic heart disease of native coronary artery without angina pectoris: Secondary | ICD-10-CM | POA: Insufficient documentation

## 2018-01-09 DIAGNOSIS — IMO0002 Reserved for concepts with insufficient information to code with codable children: Secondary | ICD-10-CM

## 2018-01-09 DIAGNOSIS — Z9114 Patient's other noncompliance with medication regimen: Secondary | ICD-10-CM | POA: Insufficient documentation

## 2018-01-09 DIAGNOSIS — E1142 Type 2 diabetes mellitus with diabetic polyneuropathy: Secondary | ICD-10-CM

## 2018-01-09 DIAGNOSIS — N390 Urinary tract infection, site not specified: Secondary | ICD-10-CM | POA: Insufficient documentation

## 2018-01-09 LAB — POCT GLYCOSYLATED HEMOGLOBIN (HGB A1C): HBA1C, POC (CONTROLLED DIABETIC RANGE): 11.8 % — AB (ref 0.0–7.0)

## 2018-01-09 LAB — GLUCOSE, POCT (MANUAL RESULT ENTRY): POC GLUCOSE: 277 mg/dL — AB (ref 70–99)

## 2018-01-09 MED ORDER — CIPROFLOXACIN HCL 500 MG PO TABS: 500.0000 mg | ORAL_TABLET | Freq: Two times a day (BID) | ORAL | 0 refills | Status: DC

## 2018-01-09 MED ORDER — INSULIN GLARGINE 100 UNIT/ML SOLOSTAR PEN: 50.0000 [IU] | PEN_INJECTOR | Freq: Every day | SUBCUTANEOUS | 99 refills | Status: DC

## 2018-01-09 MED ORDER — GABAPENTIN 300 MG PO CAPS: 300.0000 mg | ORAL_CAPSULE | Freq: Every day | ORAL | 3 refills | Status: DC

## 2018-01-09 MED FILL — GABAPENTIN 300 MG CAPSULE: 300 | 30 days supply | Qty: 30 | Fill #0

## 2018-01-09 MED FILL — CIPROFLOXACIN HCL 500 MG TA: 500 | 2 days supply | Qty: 4 | Fill #0

## 2018-01-09 NOTE — Progress Notes (Signed)
Patient ID: Kimberly Frazier, female    DOB: 1960/10/24  MRN: [id number]  CC: Diabetes; Hypertension; Fatigue; and Hospitalization Follow-up   Subjective: Kimberly Frazier is a 57 y.o. female who presents for hosp f/u. Her concerns today include:  Patient with history of diabetes with neuropathy, HTN with hypertensive heart disease, CAD,HLand obesity  Hosp 8/2-10/2017 with urosepsis and acute kidney injury -completed abx yesterday. Still feels a little sluggish.  No further fever, no vomiting.  Felt little quizzy on abx. -She was noted to have a 2.3 cm lesion in the upper pole of the right kidney.  MRI with and without contrast was recommended by radiologist.  HTN:  Cozaar on hold from hospital.  No device to check BP.  DM:  Prior to hosp she was taking 80 units Lantus at bedtime and Humalog 28 units TID. Out of a wk, she forgets to take Humalog in afternoon about 3 x a wk.   BS were high prior to hosp in 300-400.  BS lower while in hosp. she was discharged on Lantus 40 units at bedtime -since dischg BS have been >200.  Checks once a day in a.m.  Did not take Humalog this a.m but she did eat.  -Endorses numbness and burning in feet  Patient Active Problem List   Diagnosis Date Noted  . Sepsis ([place]) 12/30/2017  . UTI (urinary tract infection) 12/30/2017  . AKI (acute kidney injury) ([place])   . Hyperglycemia   . Hyperkalemia   . Diabetic peripheral neuropathy ([place]) 09/03/2016  . Headache 01/19/2016  . CAD (coronary artery disease) 04/10/2015  . Neck pain on right side 06/20/2014  . Severe obesity (BMI >= 40) ([place]) 09/20/2013  . 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 Hyperlipidemia 10/25/2012  . Diabetes mellitus type 2, uncontrolled, with complications ([place]) 88/82/8003  . Essential hypertension 07/26/2012     Current Outpatient Medications on File Prior to Visit  Medication Sig Dispense Refill  . aspirin EC 81 MG tablet Take 1 tablet (81 mg total) daily by mouth. 100 tablet 1  .  atorvastatin (LIPITOR) 40 MG tablet Take 1 tablet (40 mg total) daily by mouth. 30 tablet 6  . BD PEN NEEDLE NANO U/F 32G X 4 MM MISC USE TWICE DAILY AS DIRECTED 100 each 0  . Blood Glucose Monitoring Suppl (TRUE METRIX METER) w/Device KIT Use as directed 1 kit 0  . carvedilol (COREG) 25 MG tablet Take 1 tablet (25 mg total) by mouth 2 (two) times daily. *Patient is overdue for an appt. Please call and schedule for further refills* 60 tablet 6  . glucose blood (FREESTYLE LITE) test strip Use as instructed 100 each 12  . glucose blood (TRUE METRIX BLOOD GLUCOSE TEST) test strip Use as instructed 100 each 12  . insulin lispro (HUMALOG) 100 UNIT/ML injection Inject 25 Units into the skin 3 (three) times daily before meals.    . Insulin Pen Needle (ULTICARE MICRO PEN NEEDLES) 32G X 4 MM MISC Use as directed with insulin 100 each 12  . nitroGLYCERIN (NITROSTAT) 0.4 MG SL tablet Place 1 tab under tongue for chest pain.  May repeat after 5 minutes x 2.  DO NOT TAKE MORE THAN 3 TABS DURING AN EPISODE OF CHEST PAIN 25 tablet 6  . potassium chloride (KLOR-CON) 8 MEQ tablet Take 2 tablets (16 mEq total) by mouth daily. 60 tablet 5  . saccharomyces boulardii (FLORASTOR) 250 MG capsule Take 1 capsule (250 mg total) by mouth 2 (two) times daily.  30 capsule 0  . TRUEPLUS LANCETS 28G MISC Use as directted 100 each 6   No current facility-administered medications on file prior to visit.     No Known Allergies  Social History   Socioeconomic History  . Marital status: Single    Spouse name: Not on file  . Number of children: 2  . Years of education: 31  . Highest education level: Not on file  Occupational History  . Occupation: RETAIL    Employer: [employer]  Social Needs  . Financial resource strain: Not on file  . Food insecurity:    Worry: Not on file    Inability: Not on file  . Transportation needs:    Medical: Not on file    Non-medical: Not on file  Tobacco Use  . Smoking status: Never Smoker    . Smokeless tobacco: Never Used  Substance and Sexual Activity  . Alcohol use: Yes    Comment: occasional  . Drug use: Yes    Types: Marijuana  . Sexual activity: Yes  Lifestyle  . Physical activity:    Days per week: 0 days    Minutes per session: 0 min  . Stress: Only a little  Relationships  . Social connections:    Talks on phone: More than three times a week    Gets together: More than three times a week    Attends religious service: Never    Active member of club or organization: No    Attends meetings of clubs or organizations: Never    Relationship status: Living with partner  . Intimate partner violence:    Fear of current or ex partner: No    Emotionally abused: No    Physically abused: No    Forced sexual activity: No  Other Topics Concern  . Not on file  Social History Narrative   Regular exercise-no   No caffeine use    Family History  Problem Relation Age of Onset  . Heart disease Mother   . Hypertension Mother   . Diabetes Mother   . Cancer Father        hodgkins lymphoma  . Heart disease Sister        heart attack  . Diabetes Sister   . Cancer Other        parent  . Diabetes Other        parent  . Heart disease Other        parent  . Hyperlipidemia Other        parent  . Hypertension Other        parent  . Arthritis Other        parent  . Diabetes Maternal Aunt   . Diabetes Maternal Uncle   . Breast cancer Neg Hx     Past Surgical History:  Procedure Laterality Date  . BREAST BIOPSY  2012  . CARDIAC CATHETERIZATION N/A 02/06/2015   Procedure: Left Heart Cath and Coronary Angiography;  Surgeon: Larey Dresser, MD;  Location: [facility];  Service: Cardiovascular;  Laterality: N/A;    ROS: Review of Systems Negative except as stated above. PHYSICAL EXAM: BP 133/87   Pulse (!) 101   Temp 99.4 F (37.4 C) (Oral)   Resp 16   Wt 259 lb 12.8 oz (117.8 kg)   SpO2 96%   BMI 49.09 kg/m   Physical Exam  General appearance -  alert, well appearing, and in no distress Mental status - normal mood, behavior,  speech, dress, motor activity, and thought processes Mouth - mucous membranes moist, pharynx normal without lesions Chest - clear to auscultation, no wheezes, rales or rhonchi, symmetric air entry Heart - normal rate, regular rhythm, normal S1, S2, no murmurs, rubs, clicks or gallops Extremities - peripheral pulses normal, no pedal edema, no clubbing or cyanosis  Results for orders placed or performed in visit on 01/77/93  Basic Metabolic Panel  Result Value Ref Range   Glucose 290 (H) 65 - 99 mg/dL   BUN 22 6 - 24 mg/dL   Creatinine, Ser 1.71 (H) 0.57 - 1.00 mg/dL   GFR calc non Af Amer 32 (L) >59 mL/min/1.73   GFR calc Af Amer 37 (L) >59 mL/min/1.73   BUN/Creatinine Ratio 13 9 - 23   Sodium 136 134 - 144 mmol/L   Potassium 5.0 3.5 - 5.2 mmol/L   Chloride 95 (L) 96 - 106 mmol/L   CO2 24 20 - 29 mmol/L   Calcium 9.9 8.7 - 10.2 mg/dL  POCT glucose (manual entry)  Result Value Ref Range   POC Glucose 277 (A) 70 - 99 mg/dl  POCT glycosylated hemoglobin (Hb A1C)  Result Value Ref Range   Hemoglobin A1C     HbA1c POC (<> result, manual entry)     HbA1c, POC (prediabetic range)     HbA1c, POC (controlled diabetic range) 11.8 (A) 0.0 - 7.0 %    ASSESSMENT AND PLAN: 1. Diabetes mellitus type 2, uncontrolled, with complications ([place]) Patient was on 80 units of Lantus prior to hospital but was subsequently discharged on only 40.  Blood sugars not at goal partially due to noncompliance with the short acting insulin. -Advised to increase Lantus to 50 units.  Take short acting insulin 28 units with every meal. -Follow-up with clinical pharmacist in 1 week and bring blood sugar readings with her further insulin titration. - POCT glucose (manual entry) - POCT glycosylated hemoglobin (Hb A1C) - Microalbumin / creatinine urine ratio - Insulin Glargine (LANTUS SOLOSTAR) 100 UNIT/ML Solostar Pen; Inject 50 Units  into the skin at bedtime.  Dispense: 5 pen; Refill: PRN  2. Diabetic polyneuropathy associated with type 2 diabetes mellitus ([place]) Patient agreeable to trying low-dose of gabapentin at bedtime for her symptoms. - gabapentin (NEURONTIN) 300 MG capsule; Take 1 capsule (300 mg total) by mouth at bedtime.  Dispense: 90 capsule; Refill: 3  3. Sepsis due to Escherichia coli ([place]) Resolving.  We will give her 2 more days of antibiotics due to low-grade temp today. - ciprofloxacin (CIPRO) 500 MG tablet; Take 1 tablet (500 mg total) by mouth 2 (two) times daily.  Dispense: 4 tablet; Refill: 0  4. Essential hypertension Close to goal.  Continue to hold Cozaar for now until we recheck chemistry.  5. Right kidney mass We will recheck BMP today to see whether creatinine has returned to baseline which was normal in January of this year.  It has returned to baseline then we will go ahead and order the MRI as recommended by radiology  6. Acute kidney injury [facility])  - Basic Metabolic Panel   Patient was given the opportunity to ask questions.  Patient verbalized understanding of the plan and was able to repeat key elements of the plan.   Orders Placed This Encounter  Procedures  . Microalbumin / creatinine urine ratio  . Basic Metabolic Panel  . POCT glucose (manual entry)  . POCT glycosylated hemoglobin (Hb A1C)     Requested Prescriptions   Signed Prescriptions  Disp Refills  . gabapentin (NEURONTIN) 300 MG capsule 90 capsule 3    Sig: Take 1 capsule (300 mg total) by mouth at bedtime.  . ciprofloxacin (CIPRO) 500 MG tablet 4 tablet 0    Sig: Take 1 tablet (500 mg total) by mouth 2 (two) times daily.  . Insulin Glargine (LANTUS SOLOSTAR) 100 UNIT/ML Solostar Pen 5 pen PRN    Sig: Inject 50 Units into the skin at bedtime.    Return in about 1 month (around 02/09/2018).  Karle Plumber, MD, FACP

## 2018-01-09 NOTE — Patient Instructions (Signed)
Please give appointment with luke in 1 week for insulin titration.  Increase Lantus to 50 units daily. Check blood sugar twice a day before meals and bring in log next week when you see the clinical pharmacist.  Start Gabapentin 300 mg at bedtime to help decrease symptoms in feet.   Diabetic Neuropathy Diabetic neuropathy is a nerve disease or nerve damage that is caused by diabetes mellitus. About half of all people with diabetes mellitus have some form of nerve damage. Nerve damage is more common in those who have had diabetes mellitus for many years and who generally have not had good control of their blood sugar (glucose) level. Diabetic neuropathy is a common complication of diabetes mellitus. There are three common types of diabetic neuropathy and a fourth type that is less common and less understood:  Peripheral neuropathy-This is the most common type of diabetic neuropathy. It causes damage to the nerves of the feet and legs first and then eventually the hands and arms. The damage affects the ability to sense touch.  Autonomic neuropathy-This type causes damage to the autonomic nervous system, which controls the following functions: ? Heartbeat. ? Body temperature. ? Blood pressure. ? Urination. ? Digestion. ? Sweating. ? Sexual function.  Focal neuropathy-Focal neuropathy can be painful and unpredictable and occurs most often in older adults with diabetes mellitus. It involves a specific nerve or one area and often comes on suddenly. It usually does not cause long-term problems.  Radiculoplexus neuropathy- Sometimes called lumbosacral radiculoplexus neuropathy, radiculoplexus neuropathy affects the nerves of the thighs, hips, buttocks, or legs. It is more common in people with type 2 diabetes mellitus and in older men. It is characterized by debilitating pain, weakness, and atrophy, usually in the thigh muscles.  What are the causes? The cause of peripheral, autonomic, and focal  neuropathies is diabetes mellitus that is uncontrolled and high glucose levels. The cause of radiculoplexus neuropathy is unknown. However, it is thought to be caused by inflammation related to uncontrolled glucose levels. What are the signs or symptoms? Peripheral Neuropathy Peripheral neuropathy develops slowly over time. When the nerves of the feet and legs no longer work there may be:  Burning, stabbing, or aching pain in the legs or feet.  Inability to feel pressure or pain in your feet. This can lead to: ? Thick calluses over pressure areas. ? Pressure sores. ? Ulcers.  Foot deformities.  Reduced ability to feel temperature changes.  Muscle weakness.  Autonomic Neuropathy The symptoms of autonomic neuropathy vary depending on which nerves are affected. Symptoms may include:  Problems with digestion, such as: ? Feeling sick to your stomach (nausea). ? Vomiting. ? Bloating. ? Constipation. ? Diarrhea. ? Abdominal pain.  Difficulty with urination. This occurs if you lose your ability to sense when your bladder is full. Problems include: ? Urine leakage (incontinence). ? Inability to empty your bladder completely (retention).  Rapid or irregular heartbeat (palpitations).  Blood pressure drops when you stand up (orthostatic hypotension). When you stand up you may feel: ? Dizzy. ? Weak. ? Faint.  In men, inability to attain and maintain an erection.  In women, vaginal dryness and problems with decreased sexual desire and arousal.  Problems with body temperature regulation.  Increased or decreased sweating.  Focal Neuropathy  Abnormal eye movements or abnormal alignment of both eyes.  Weakness in the wrist.  Foot drop. This results in an inability to lift the foot properly and abnormal walking or foot movement.  Paralysis on one  side of your face (Bell palsy).  Chest or abdominal pain. Radiculoplexus Neuropathy  Sudden, severe pain in your hip, thigh, or  buttocks.  Weakness and wasting of thigh muscles.  Difficulty rising from a seated position.  Abdominal swelling.  Unexplained weight loss (usually more than 10 lb [4.5 kg]). How is this diagnosed? Peripheral Neuropathy Your senses may be tested. Sensory function testing can be done with:  A light touch using a monofilament.  A vibration with tuning fork.  A sharp sensation with a pin prick.  Other tests that can help diagnose neuropathy are:  Nerve conduction velocity. This test checks the transmission of an electrical current through a nerve.  Electromyography. This shows how muscles respond to electrical signals transmitted by nearby nerves.  Quantitative sensory testing. This is used to assess how your nerves respond to vibrations and changes in temperature.  Autonomic Neuropathy Diagnosis is often based on reported symptoms. Tell your health care provider if you experience:  Dizziness.  Constipation.  Diarrhea.  Inappropriate urination or inability to urinate.  Inability to get or maintain an erection.  Tests that may be done include:  Electrocardiography or Holter monitor. These are tests that can help show problems with the heart rate or heart rhythm.  An X-ray exam may be done.  Focal Neuropathy Diagnosis is made based on your symptoms and what your health care provider finds during your exam. Other tests may be done. They may include:  Nerve conduction velocities. This checks the transmission of electrical current through a nerve.  Electromyography. This shows how muscles respond to electrical signals transmitted by nearby nerves.  Quantitative sensory testing. This test is used to assess how your nerves respond to vibration and changes in temperature.  Radiculoplexus Neuropathy  Often the first thing is to eliminate any other issue or problems that might be the cause, as there is no standard test for diagnosis.  X-ray exam of your spine and lumbar  region.  Spinal tap to rule out cancer.  MRI to rule out other lesions. How is this treated? Once nerve damage occurs, it cannot be reversed. The goal of treatment is to keep the disease or nerve damage from getting worse and affecting more nerve fibers. Controlling your blood glucose level is the key. Most people with radiculoplexus neuropathy see at least a partial improvement over time. You will need to keep your blood glucose and HbA1c levels in the target range determined by your health care provider. Things that help control blood glucose levels include:  Blood glucose monitoring.  Meal planning.  Physical activity.  Diabetes medicine.  Over time, maintaining lower blood glucose levels helps lessen symptoms. Sometimes, prescription pain medicine is needed. Follow these instructions at home:  Do not smoke.  Keep your blood glucose level in the range that you and your health care provider have determined acceptable for you.  Keep your blood pressure level in the range that you and your health care provider have determined acceptable for you.  Eat a well-balanced diet.  Be physically active every day. Include strength training and balance exercises.  Protect your feet. ? Check your feet every day for sores, cuts, blisters, or signs of infection. ? Wear padded socks and supportive shoes. Use orthotic inserts, if necessary. ? Regularly check the insides of your shoes for worn spots. Make sure there are no rocks or other items inside your shoes before you put them on. Contact a health care provider if:  You have burning, stabbing,  or aching pain in the legs or feet.  You are unable to feel pressure or pain in your feet.  You develop problems with digestion such as: ? Nausea. ? Vomiting. ? Bloating. ? Constipation. ? Diarrhea. ? Abdominal pain.  You have difficulty with urination, such as: ? Incontinence. ? Retention.  You have palpitations.  You develop  orthostatic hypotension. When you stand up you may feel: ? Dizzy. ? Weak. ? Faint.  You cannot attain and maintain an erection (in men).  You have vaginal dryness and problems with decreased sexual desire and arousal (in women).  You have severe pain in your thighs, legs, or buttocks.  You have unexplained weight loss. This information is not intended to replace advice given to you by your health care provider. Make sure you discuss any questions you have with your health care provider. Document Released: 07/26/2001 Document Revised: 10/23/2015 Document Reviewed: 10/26/2012 Elsevier Interactive Patient Education  2017 [employer].

## 2018-01-10 LAB — BASIC METABOLIC PANEL
BUN/Creatinine Ratio: 13 (ref 9–23)
BUN: 22 mg/dL (ref 6–24)
CALCIUM: 9.9 mg/dL (ref 8.7–10.2)
CO2: 24 mmol/L (ref 20–29)
CREATININE: 1.71 mg/dL — AB (ref 0.57–1.00)
Chloride: 95 mmol/L — ABNORMAL LOW (ref 96–106)
GFR calc Af Amer: 37 mL/min/{1.73_m2} — ABNORMAL LOW (ref >59)
GFR, EST NON AFRICAN AMERICAN: 32 mL/min/{1.73_m2} — AB (ref >59)
Glucose: 290 mg/dL — ABNORMAL HIGH (ref 65–99)
Potassium: 5 mmol/L (ref 3.5–5.2)
Sodium: 136 mmol/L (ref 134–144)

## 2018-01-11 ENCOUNTER — Encounter: Payer: Self-pay | Admitting: Internal Medicine

## 2018-01-11 ENCOUNTER — Telehealth: Payer: Self-pay | Admitting: Internal Medicine

## 2018-01-11 ENCOUNTER — Other Ambulatory Visit: Payer: Self-pay | Admitting: Internal Medicine

## 2018-01-11 ENCOUNTER — Telehealth: Payer: Self-pay

## 2018-01-11 DIAGNOSIS — N2889 Other specified disorders of kidney and ureter: Secondary | ICD-10-CM

## 2018-01-11 NOTE — Progress Notes (Signed)
Dr. Wynetta Emery I contacted the radiology dept and they stated it will be just an MRI Abdomen

## 2018-01-11 NOTE — Telephone Encounter (Signed)
Called patient and LVM to return call and schedule a 1 month f/u with PCP.

## 2018-01-11 NOTE — Telephone Encounter (Signed)
Contacted pt to go over lab results pt didn't answer lvm asking pt to give me a call at her earliest convenience  

## 2018-01-11 NOTE — Addendum Note (Signed)
Addended by: Karle Plumber B on: 01/11/2018 07:04 PM   Modules accepted: Orders

## 2018-01-11 NOTE — Progress Notes (Signed)
Spoke with radiologist Dr. Minette Headland about doing recommended MRI.  Pts GFR is 37.  He recommends doing the MRI with 1/2 dose contrast.  Order placed.

## 2018-01-12 NOTE — Progress Notes (Signed)
Contacted pt to give her the appointment information for her MRI  MRI is schedule for January 18, 2018 @2pm  @[facility]. Pt is to arrive @130pm .   Pt is aware and doesn't have any questions or concerns  Pt is currently not covered under [facility]

## 2018-01-18 ENCOUNTER — Ambulatory Visit (HOSPITAL_COMMUNITY)
Admission: RE | Admit: 2018-01-18 | Discharge: 2018-01-18 | Disposition: A | Discharge status: Home / Self Care | Payer: Self-pay | Source: Ambulatory Visit | Attending: Internal Medicine | Admitting: Internal Medicine

## 2018-01-18 DIAGNOSIS — N2889 Other specified disorders of kidney and ureter: Secondary | ICD-10-CM | POA: Insufficient documentation

## 2018-01-18 DIAGNOSIS — D3502 Benign neoplasm of left adrenal gland: Secondary | ICD-10-CM | POA: Insufficient documentation

## 2018-01-18 DIAGNOSIS — N281 Cyst of kidney, acquired: Secondary | ICD-10-CM | POA: Insufficient documentation

## 2018-01-18 MED ORDER — GADOBENATE DIMEGLUMINE 529 MG/ML IV SOLN
10.0000 mL | Freq: Once | INTRAVENOUS | Status: AC | PRN
  Administered 2018-01-18: 10 mL via INTRAVENOUS

## 2018-01-23 ENCOUNTER — Telehealth: Payer: Self-pay

## 2018-01-23 ENCOUNTER — Ambulatory Visit: Payer: Self-pay | Attending: Internal Medicine | Admitting: Pharmacist

## 2018-01-23 ENCOUNTER — Encounter: Payer: Self-pay | Admitting: Pharmacist

## 2018-01-23 DIAGNOSIS — IMO0002 Reserved for concepts with insufficient information to code with codable children: Secondary | ICD-10-CM

## 2018-01-23 DIAGNOSIS — E118 Type 2 diabetes mellitus with unspecified complications: Secondary | ICD-10-CM

## 2018-01-23 DIAGNOSIS — E1165 Type 2 diabetes mellitus with hyperglycemia: Secondary | ICD-10-CM | POA: Insufficient documentation

## 2018-01-23 LAB — POCT URINALYSIS DIP (CLINITEK)
Bilirubin, UA: NEGATIVE
Glucose, UA: 500 mg/dL — AB
Ketones, POC UA: NEGATIVE mg/dL
Nitrite, UA: NEGATIVE
SPEC GRAV UA: 1.01 (ref 1.010–1.025)
UROBILINOGEN UA: 0.2 U/dL
pH, UA: 5 (ref 5.0–8.0)

## 2018-01-23 LAB — GLUCOSE, POCT (MANUAL RESULT ENTRY)
POC GLUCOSE: 417 mg/dL — AB (ref 70–99)
POC Glucose: 346 mg/dl — AB (ref 70–99)

## 2018-01-23 MED ORDER — INSULIN GLARGINE 100 UNIT/ML SOLOSTAR PEN: 60.0000 [IU] | PEN_INJECTOR | Freq: Every day | SUBCUTANEOUS | 2 refills | Status: DC

## 2018-01-23 MED ORDER — INSULIN ASPART 100 UNIT/ML ~~LOC~~ SOLN: 10.0000 [IU] | Freq: Once | SUBCUTANEOUS | Status: DC

## 2018-01-23 MED ORDER — INSULIN LISPRO 100 UNIT/ML ~~LOC~~ SOLN: 28.0000 [IU] | Freq: Three times a day (TID) | SUBCUTANEOUS | 2 refills | Status: DC

## 2018-01-23 MED FILL — ?HUMALOG 100 UNITS/ML VIAL: 100 | 11 days supply | Qty: 10 | Fill #0

## 2018-01-23 NOTE — Telephone Encounter (Signed)
Pt is in office today to see Lurena Joiner for her DM. Went over MRI results with pt and scheduled her a 1 month f/u with Dr. Wynetta Emery

## 2018-01-23 NOTE — Patient Instructions (Signed)
Thank you for coming to see me today. Please do the following:  1. Increase Lantus to 60 units daily. 2. Keep taking Humalog 28 units 3 times daily. 3. Continue checking blood sugars at home. It's really important that you record these and bring these in to your next doctor's appointment. If you get in readings above 500 or lower than 70, call me or the clinic to let your doctor know. See below on how to treat low blood sugar.  4. Continue making the lifestyle changes we've discussed together during our visit. Diet and exercise play a significant role in improving your blood sugars.  5. Follow-up with me 02/13/18.   Hypoglycemia or low blood sugar:   Low blood sugar can happen quickly and may become an emergency if not treated right away.   While this shouldn't happen often, it can be brought upon if you skip a meal or do not eat enough. Also, if your insulin or other diabetes medications are dosed too high, this can cause your blood sugar to go to low.   Warning signs of low blood sugar include: 1. Feeling shaky or dizzy 2. Feeling weak or tired  3. Excessive hunger 4. Feeling anxious or upset  5. Sweating even when you aren't exercising  What to do if I experience low blood sugar? 1. Check your blood sugar with your meter. If lower than 70, proceed to step 2.  2. Treat with 3-4 glucose tablets or 3 packets of regular sugar. If these aren't around, you can try hard candy. Yet another option would be to drink 4 ounces of fruit juice or 6 ounces of REGULAR soda.  3. Re-check your sugar in 15 minutes. If it is still below 70, do what you did in step 2 again. If has come back up, go ahead and eat a snack or small meal at this time.

## 2018-01-23 NOTE — Progress Notes (Signed)
    S:    PCP: Dr. Wynetta Emery  No chief complaint on file.  Patient arrives in good spirits.  Presents for diabetes evaluation, education, and management at the request of PCP. Patient was referred and last seen by  Dr. Wynetta Emery on 01/09/18.  At that visit, Lantus increased from 40 units to 50 units daily.   Today, pt reports feeling well. States that her sugars at home continue to range in the 300s. Reports eating breakfast of eggs, hashbrowns, steak, and toast at 10 this morning.   Family/Social History:  - Tobacco: never smoker - Alcohol: occasional - Illicit drugs: occasional marijuana use  Insurance coverage/medication affordability:  - Patient assistance at [facility] pharmacy  Patient reports adherence with medications.  Current diabetes medications include:  - Lantus 50 units daily  - Humalog 28 units TID  Patient denies hypoglycemic events.  Patient reported dietary habits:  - Eats 3 meals/day - Reports high intake of white carbs (bread and potato mostly)  Patient-reported exercise habits:  - does not exercise   Patient reports nocturia.  Patient reports neuropathy. Patient reports visual changes. Patient denies self foot exams.   O:  POCT glucose: 417; 346 on re-check after 10 units of Novolog given UA: positive for glucose, RBCs, and WBCs - negative for ketones  Lab Results  Component Value Date   HGBA1C 11.8 (A) 01/09/2018   There were no vitals filed for this visit.  Lipid Panel     Component Value Date/Time   CHOL 234 (H) 04/18/2017 1501   TRIG 225 (H) 04/18/2017 1501   HDL 42 04/18/2017 1501   CHOLHDL 5.6 (H) 04/18/2017 1501   CHOLHDL 5 07/15/2015 1513   VLDL 44.0 (H) 07/15/2015 1513   LDLCALC 147 (H) 04/18/2017 1501   LDLDIRECT 93.0 07/15/2015 1513   A/P: Diabetes longstanding currently uncontrolled based on above goal glucose levels and A1c of 11.8. Patient is able to verbalize appropriate hypoglycemia management plan. Patient is adherent with  medication. Control is suboptimal due to dietary indiscretion and poor insulin administration technique.   Patient with POCT glucose > 400. Discussed with PCP who ordered 10 units Novolog with UA. UA negative for ketones. Patient's glucose responded well. Re-check was 346.  Suspect that patient is not getting full dose of insulin d/t poor administration technique. Also, she reports dietary indiscretion. Administration technique reviewed. MyPlate method reviewed. Additionally, patient is endorsing symptoms of hyperglycemia. Will have her increase basal insulin to achieve fasting goals.  -Increased dose of Lantus (insulin glargine) from 50 units to 60 units.  -Continued Humalog (insulin lispro) at 28 units TID before meals.  -Extensively discussed pathophysiology of DM, recommended lifestyle interventions, dietary effects on glycemic control -Counseled on s/sx of and management of hypoglycemia -Next A1C anticipated 11/19.   Written patient instructions provided.  Total time in face to face counseling 30 minutes.   Follow up Pharmacist Visit in 3 weeks.     Benard Halsted, PharmD, [place] [phone]

## 2018-01-24 MED FILL — $LANTUS SOLOSTAR 100 UNITS/: 100 | 25 days supply | Qty: 15 | Fill #0

## 2018-02-08 ENCOUNTER — Telehealth: Payer: Self-pay | Admitting: Internal Medicine

## 2018-02-08 DIAGNOSIS — E118 Type 2 diabetes mellitus with unspecified complications: Principal | ICD-10-CM

## 2018-02-08 DIAGNOSIS — IMO0002 Reserved for concepts with insufficient information to code with codable children: Secondary | ICD-10-CM

## 2018-02-08 DIAGNOSIS — E1165 Type 2 diabetes mellitus with hyperglycemia: Secondary | ICD-10-CM

## 2018-02-08 MED FILL — ?ATORVASTATIN 40MG TABLET: 40 | 30 days supply | Qty: 30 | Fill #4

## 2018-02-08 NOTE — Telephone Encounter (Signed)
Will forward to pcp

## 2018-02-08 NOTE — Telephone Encounter (Signed)
Patient would like referral to an eye specialist, please follow up.

## 2018-02-13 ENCOUNTER — Ambulatory Visit: Payer: Self-pay | Attending: Family Medicine | Admitting: Pharmacist

## 2018-02-13 ENCOUNTER — Encounter: Payer: Self-pay | Admitting: Pharmacist

## 2018-02-13 DIAGNOSIS — Z9114 Patient's other noncompliance with medication regimen: Secondary | ICD-10-CM | POA: Insufficient documentation

## 2018-02-13 DIAGNOSIS — Z794 Long term (current) use of insulin: Secondary | ICD-10-CM | POA: Insufficient documentation

## 2018-02-13 DIAGNOSIS — IMO0002 Reserved for concepts with insufficient information to code with codable children: Secondary | ICD-10-CM

## 2018-02-13 DIAGNOSIS — E1165 Type 2 diabetes mellitus with hyperglycemia: Secondary | ICD-10-CM

## 2018-02-13 DIAGNOSIS — E118 Type 2 diabetes mellitus with unspecified complications: Secondary | ICD-10-CM

## 2018-02-13 NOTE — Progress Notes (Signed)
    S:    PCP: Dr. Wynetta Emery  No chief complaint on file.  Patient arrives in good spirits.  Presents for diabetes management at the request of Dr. Wynetta Emery. Patient was referred and last seen by Dr. Wynetta Emery on 01/09/18.  She last saw me on 01/23/18. At that visit, Lantus was increased from 50 to 60. Additionally, we reviewed injection technique.   Today, pt reports feeling better. She reports improvement of injection site symptoms with improved technique. Notes improvement with polyuria, polydipsia, polyphagia, neuropathy, and retinopathy. Patient denies hypoglycemic events.  Patient reported dietary habits:  - Eats 3 meals/day - Reports decreased intake of white carbs since last encounter but does slip occasionally - Denies eating sweet foods or sweetened beverages  She brings in her meter today.  7-day average: 262 14-day average: 260 30-day average: 250 Fasting range: 189 - 312. Of note, only 2 readings in 300 range since seeing me.  Post-prandial: 145 - 276  Family/Social History:  - Tobacco: never smoker - Alcohol: occasional - Illicit drugs: occasional marijuana use  Insurance coverage/medication affordability:  - Patient assistance at [facility] pharmacy  Patient reports adherence with medications.  Current diabetes medications include:  - Lantus 60 units daily  - Humalog 28 units TID  Patient-reported exercise habits:  - Does not exercise - Of note, she is trying to establish with local [employer]  O:  POCT glucose: 223. Has eaten today.   Lab Results  Component Value Date   HGBA1C 11.8 (A) 01/09/2018   There were no vitals filed for this visit.  Lipid Panel     Component Value Date/Time   CHOL 234 (H) 04/18/2017 1501   TRIG 225 (H) 04/18/2017 1501   HDL 42 04/18/2017 1501   CHOLHDL 5.6 (H) 04/18/2017 1501   CHOLHDL 5 07/15/2015 1513   VLDL 44.0 (H) 07/15/2015 1513   LDLCALC 147 (H) 04/18/2017 1501   LDLDIRECT 93.0 07/15/2015 1513   A/P: Diabetes longstanding  currently uncontrolled based on home glucose levels and A1c of 11.8. Patient is able to verbalize appropriate hypoglycemia management plan. She is adherent with medication. Control is suboptimal due to some continued dietary indiscretion.   Will continue insulin alone for now. Would prefer to have patient self-titrate Lantus to achieve goal fasting levels. However, cannot do this currently given her history of non-compliance. Of note, would recommend addition of a GLP-1 RA given patient's CAD. She would benefit from additional A1c lowering as well. Will forward this information to her PCP. Pt is responding well to insulin and does not wish to add an additional agent at this time.   -Increased dose of Lantus (insulin glargine) from 60 to 68 units before bed -Continued Humalog (insulin lispro) at 28 units TID before meals.  -Extensively discussed pathophysiology of DM, recommended lifestyle interventions, dietary effects on glycemic control -Counseled on s/sx of and management of hypoglycemia -Next A1C anticipated 11/19.   Written patient instructions provided.  Total time in face to face counseling 30 minutes.   Follow up with PCP 03/02/18  Patient seen with:  Marylene Buerger, PharmD Candidate [place] [employer] 2021  Benard Halsted, PharmD, [place] [phone]

## 2018-02-13 NOTE — Patient Instructions (Signed)
Thank you for coming to see me today. Please do the following:  1. Increase Lantus to 68 units.  2. Continue Humalog 28 units 3 times a day before meals. 3. Continue checking blood sugars at home. 4. Continue making the lifestyle changes we've discussed together during our visit. Diet and exercise play a significant role in improving your blood sugars.  5. Follow-up with PCP 03/02/18.    Hypoglycemia or low blood sugar:   Low blood sugar can happen quickly and may become an emergency if not treated right away.   While this shouldn't happen often, it can be brought upon if you skip a meal or do not eat enough. Also, if your insulin or other diabetes medications are dosed too high, this can cause your blood sugar to go to low.   Warning signs of low blood sugar include: 1. Feeling shaky or dizzy 2. Feeling weak or tired  3. Excessive hunger 4. Feeling anxious or upset  5. Sweating even when you aren't exercising  What to do if I experience low blood sugar? 1. Check your blood sugar with your meter. If lower than 70, proceed to step 2.  2. Treat with 3-4 glucose tablets or 3 packets of regular sugar. If these aren't around, you can try hard candy. Yet another option would be to drink 4 ounces of fruit juice or 6 ounces of REGULAR soda.  3. Re-check your sugar in 15 minutes. If it is still below 70, do what you did in step 2 again. If has come back up, go ahead and eat a snack or small meal at this time.

## 2018-02-27 MED FILL — ?HUMALOG 100 UNITS/ML VIAL: 100 | 11 days supply | Qty: 10 | Fill #1

## 2018-03-02 ENCOUNTER — Ambulatory Visit: Payer: Self-pay | Admitting: Internal Medicine

## 2018-03-17 NOTE — Telephone Encounter (Signed)
error 

## 2018-03-21 MED FILL — ?HUMALOG 100 UNITS/ML VIAL: 100 | 11 days supply | Qty: 10 | Fill #2

## 2018-03-21 MED FILL — $LANTUS SOLOSTAR 100 UNITS/: 100 | 25 days supply | Qty: 15 | Fill #1

## 2018-05-01 MED FILL — $LANTUS SOLOSTAR 100 UNITS/: 100 | 25 days supply | Qty: 15 | Fill #2

## 2018-05-01 MED FILL — POTASSIUM CL ER 8 MEQ CAP: 8 | 30 days supply | Qty: 60 | Fill #1

## 2018-05-03 ENCOUNTER — Other Ambulatory Visit: Payer: Self-pay | Admitting: Internal Medicine

## 2018-05-03 DIAGNOSIS — I2510 Atherosclerotic heart disease of native coronary artery without angina pectoris: Secondary | ICD-10-CM

## 2018-05-03 MED FILL — CARVEDILOL 25 MG TABLET: 25 | 30 days supply | Qty: 60 | Fill #3

## 2018-05-04 MED FILL — ?ATORVASTATIN 40MG TABLET: 40 | 30 days supply | Qty: 30 | Fill #0

## 2018-05-04 MED FILL — ?HUMALOG 100 UNITS/ML VIAL: 100 | 2 days supply | Qty: 20 | Fill #0

## 2018-05-17 NOTE — Addendum Note (Signed)
Addended by: Jackelyn Knife on: 05/17/2018 05:32 PM   Modules accepted: Orders

## 2018-05-18 ENCOUNTER — Ambulatory Visit: Payer: Self-pay | Admitting: Internal Medicine

## 2018-06-14 ENCOUNTER — Telehealth: Payer: Self-pay | Admitting: Internal Medicine

## 2018-06-14 NOTE — Telephone Encounter (Signed)
Pt called requesting a nurse call her in regards to a private issue, she did not want to disclose the problem with me. Please follow up

## 2018-06-14 NOTE — Telephone Encounter (Signed)
Returned pt call pt stated that Monday when she went to the bathroom the toilet was filled with blood. Pt states she also went to the bathroom and she felt like she had to urinate but when she tried to she wasn't able to and pt stated she felt like something popped inside her.   Schedule pt and appointment with pcp for tomorrow. FYI

## 2018-06-15 ENCOUNTER — Ambulatory Visit (HOSPITAL_BASED_OUTPATIENT_CLINIC_OR_DEPARTMENT_OTHER): Payer: Self-pay | Admitting: Pharmacist

## 2018-06-15 ENCOUNTER — Encounter: Payer: Self-pay | Admitting: Pharmacist

## 2018-06-15 ENCOUNTER — Encounter: Payer: Self-pay | Admitting: Internal Medicine

## 2018-06-15 ENCOUNTER — Ambulatory Visit: Payer: Medicaid Other | Attending: Internal Medicine | Admitting: Internal Medicine

## 2018-06-15 ENCOUNTER — Ambulatory Visit: Payer: Self-pay | Attending: Internal Medicine | Admitting: Licensed Clinical Social Worker

## 2018-06-15 VITALS — BP 165/108 | HR 97 | Temp 98.4°F | Resp 16 | Ht 64.0 in | Wt 274.8 lb

## 2018-06-15 DIAGNOSIS — F331 Major depressive disorder, recurrent, moderate: Secondary | ICD-10-CM

## 2018-06-15 DIAGNOSIS — Z7982 Long term (current) use of aspirin: Secondary | ICD-10-CM | POA: Diagnosis not present

## 2018-06-15 DIAGNOSIS — Z8249 Family history of ischemic heart disease and other diseases of the circulatory system: Secondary | ICD-10-CM | POA: Insufficient documentation

## 2018-06-15 DIAGNOSIS — Z794 Long term (current) use of insulin: Secondary | ICD-10-CM | POA: Insufficient documentation

## 2018-06-15 DIAGNOSIS — D3502 Benign neoplasm of left adrenal gland: Secondary | ICD-10-CM | POA: Diagnosis not present

## 2018-06-15 DIAGNOSIS — I119 Hypertensive heart disease without heart failure: Secondary | ICD-10-CM | POA: Insufficient documentation

## 2018-06-15 DIAGNOSIS — I2510 Atherosclerotic heart disease of native coronary artery without angina pectoris: Secondary | ICD-10-CM | POA: Insufficient documentation

## 2018-06-15 DIAGNOSIS — F419 Anxiety disorder, unspecified: Secondary | ICD-10-CM

## 2018-06-15 DIAGNOSIS — E785 Hyperlipidemia, unspecified: Secondary | ICD-10-CM | POA: Diagnosis not present

## 2018-06-15 DIAGNOSIS — IMO0002 Reserved for concepts with insufficient information to code with codable children: Secondary | ICD-10-CM

## 2018-06-15 DIAGNOSIS — E1165 Type 2 diabetes mellitus with hyperglycemia: Secondary | ICD-10-CM | POA: Diagnosis not present

## 2018-06-15 DIAGNOSIS — Z6841 Body Mass Index (BMI) 40.0 and over, adult: Secondary | ICD-10-CM | POA: Diagnosis not present

## 2018-06-15 DIAGNOSIS — Z79899 Other long term (current) drug therapy: Secondary | ICD-10-CM | POA: Insufficient documentation

## 2018-06-15 DIAGNOSIS — Z8544 Personal history of malignant neoplasm of other female genital organs: Secondary | ICD-10-CM | POA: Insufficient documentation

## 2018-06-15 DIAGNOSIS — Z7901 Long term (current) use of anticoagulants: Secondary | ICD-10-CM | POA: Insufficient documentation

## 2018-06-15 DIAGNOSIS — E1142 Type 2 diabetes mellitus with diabetic polyneuropathy: Secondary | ICD-10-CM

## 2018-06-15 DIAGNOSIS — N179 Acute kidney failure, unspecified: Secondary | ICD-10-CM | POA: Diagnosis not present

## 2018-06-15 DIAGNOSIS — I10 Essential (primary) hypertension: Secondary | ICD-10-CM

## 2018-06-15 DIAGNOSIS — N950 Postmenopausal bleeding: Secondary | ICD-10-CM | POA: Insufficient documentation

## 2018-06-15 LAB — POCT GLYCOSYLATED HEMOGLOBIN (HGB A1C): HBA1C, POC (CONTROLLED DIABETIC RANGE): 11.7 % — AB (ref 0.0–7.0)

## 2018-06-15 LAB — GLUCOSE, POCT (MANUAL RESULT ENTRY): POC GLUCOSE: 290 mg/dL — AB (ref 70–99)

## 2018-06-15 MED ORDER — INSULIN GLARGINE 100 UNIT/ML SOLOSTAR PEN: 65.0000 [IU] | PEN_INJECTOR | Freq: Every day | SUBCUTANEOUS | 2 refills | Status: DC

## 2018-06-15 MED ORDER — DULAGLUTIDE 0.75 MG/0.5ML ~~LOC~~ SOAJ: 0.7500 mg | SUBCUTANEOUS | 4 refills | Status: DC

## 2018-06-15 MED FILL — $LANTUS SOLOSTAR 100 UNITS/: 100 | 46 days supply | Qty: 30 | Fill #0

## 2018-06-15 MED FILL — !TRULICITY 0.75 MG/0.5 ML P: 0.75 | 28 days supply | Qty: 2 | Fill #0

## 2018-06-15 NOTE — Progress Notes (Signed)
Patient was educated on the use of the MyPlate method. Reviewed carbohydrate counting and carbohydrate limits per meal/snack. Also reviewed goal blood glucose levels. I encouraged her to bring in the hand-out I provided to her appointment with the dietician. All questions and concerns were addressed. Also encouraged pt to make appt with me once she obtains Trulicity so I can educate her on administration technique.   Benard Halsted, PharmD, [place] [phone]

## 2018-06-15 NOTE — Progress Notes (Signed)
Patient ID: Kimberly Frazier, female    DOB: [birth year]  MRN: [id number]  CC: Follow-up (MRI)   Subjective: Kimberly Frazier is a 58 y.o. female who presents for chronic ds management Her concerns today include:  Patient with history of diabetes with neuropathy, HTN with hypertensive heart disease, CAD,HLand obesity  Pt c/o vaginal bleeding/spotting daily since August or Sept 2019.  Wears pad more so for leaky bladder.  Bleeding became heavy for 1 day earlier this week with a lot of clotting. 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  Reportedly seen in f/u by GYN for 1.5 yrs after treatment.  She has not had a Pap smear in years.  DIABETES TYPE 2 Last A1C:   Results for orders placed or performed in visit on 06/15/18  POCT glucose (manual entry)  Result Value Ref Range   POC Glucose 290 (A) 70 - 99 mg/dl  POCT glycosylated hemoglobin (Hb A1C)  Result Value Ref Range   Hemoglobin A1C     HbA1c POC (<> result, manual entry)     HbA1c, POC (prediabetic range)     HbA1c, POC (controlled diabetic range) 11.7 (A) 0.0 - 7.0 %    Med Adherence:  [x] Yes -  Takes Lantus 60-65,  Humalog 30 units TID (sometimes she takes only 2 x a day)  Medication side effects:  [] Yes    [x] No Home Monitoring?  [x] Yes    [] No Home glucose results range: 200 and above Diet Adherence: [x] Yes, "I don't eat a lot of sweets or pork."    [] No Exercise: [] Yes    [x] No Hypoglycemic episodes?: [] Yes    [x] No Numbness of the feet? [] Yes    [x] No but gets sharp shooting pains in feet.  She is on gabapentin for neuropathy symptoms. Retinopathy hx? [] Yes    [x] No Last eye exam:  Comments:   HTN:  Did not take med as yet this a.m. she is on carvedilol.  Endorses salt restriction.  Denies any chest pains, shortness of breath or lower extremity edema.  On last visit we had ordered an MRI to  evaluate a possible mass on the right kidney.  This did not bear out on MRI.  She did have 2 small cysts on the left kidney.  Also incidental finding of a left adrenal adenoma 1.7 cm in size. Patient Active Problem List   Diagnosis Date Noted  . Sepsis ([place]) 12/30/2017  . UTI (urinary tract infection) 12/30/2017  . AKI (acute kidney injury) ([place])   . Hyperglycemia   . Hyperkalemia   . Diabetic peripheral neuropathy ([place]) 09/03/2016  . Headache 01/19/2016  . CAD (coronary artery disease) 04/10/2015  . Neck pain on right side 06/20/2014  . Severe obesity (BMI >= 40) ([place]) 09/20/2013  . 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 Hyperlipidemia 10/25/2012  . Diabetes mellitus type 2, uncontrolled, with complications ([place]) 16/05/930  . Essential hypertension 07/26/2012     Current Outpatient Medications on File Prior to Visit  Medication Sig Dispense Refill  . aspirin EC 81 MG tablet Take 1 tablet (81 mg total) daily by mouth. 100 tablet 1  . atorvastatin (LIPITOR) 40 MG tablet TAKE 1 TABLET DAILY BY MOUTH. 30 tablet 2  . BD PEN NEEDLE NANO U/F 32G X 4 MM MISC  USE TWICE DAILY AS DIRECTED 100 each 0  . Blood Glucose Monitoring Suppl (TRUE METRIX METER) w/Device KIT Use as directed 1 kit 0  . carvedilol (COREG) 25 MG tablet Take 1 tablet (25 mg total) by mouth 2 (two) times daily. *Patient is overdue for an appt. Please call and schedule for further refills* 60 tablet 6  . gabapentin (NEURONTIN) 300 MG capsule Take 1 capsule (300 mg total) by mouth at bedtime. 90 capsule 3  . glucose blood (FREESTYLE LITE) test strip Use as instructed 100 each 12  . glucose blood (TRUE METRIX BLOOD GLUCOSE TEST) test strip Use as instructed 100 each 12  . HUMALOG 100 UNIT/ML injection INJECT 28 UNITS INTO THE SKIN THREE TIMES DAILY BEFORE MEALS. 20 mL 2  . Insulin Pen Needle (ULTICARE MICRO PEN NEEDLES) 32G X 4 MM MISC Use as directed with insulin 100 each 12  . nitroGLYCERIN (NITROSTAT) 0.4 MG SL tablet Place 1  tab under tongue for chest pain.  May repeat after 5 minutes x 2.  DO NOT TAKE MORE THAN 3 TABS DURING AN EPISODE OF CHEST PAIN 25 tablet 6  . potassium chloride (KLOR-CON) 8 MEQ tablet Take 2 tablets (16 mEq total) by mouth daily. 60 tablet 5  . TRUEPLUS LANCETS 28G MISC Use as directted 100 each 6   No current facility-administered medications on file prior to visit.     No Known Allergies  Social History   Socioeconomic History  . Marital status: Single    Spouse name: Not on file  . Number of children: 2  . Years of education: 45  . Highest education level: Not on file  Occupational History  . Occupation: RETAIL    Employer: [employer]  Social Needs  . Financial resource strain: Not on file  . Food insecurity:    Worry: Not on file    Inability: Not on file  . Transportation needs:    Medical: Not on file    Non-medical: Not on file  Tobacco Use  . Smoking status: Never Smoker  . Smokeless tobacco: Never Used  Substance and Sexual Activity  . Alcohol use: Yes    Comment: occasional  . Drug use: Yes    Types: Marijuana  . Sexual activity: Yes  Lifestyle  . Physical activity:    Days per week: 0 days    Minutes per session: 0 min  . Stress: Only a little  Relationships  . Social connections:    Talks on phone: More than three times a week    Gets together: More than three times a week    Attends religious service: Never    Active member of club or organization: No    Attends meetings of clubs or organizations: Never    Relationship status: Living with partner  . Intimate partner violence:    Fear of current or ex partner: No    Emotionally abused: No    Physically abused: No    Forced sexual activity: No  Other Topics Concern  . Not on file  Social History Narrative   Regular exercise-no   No caffeine use    Family History  Problem Relation Age of Onset  . Heart disease Mother   . Hypertension Mother   . Diabetes Mother   . Cancer Father         hodgkins lymphoma  . Heart disease Sister        heart attack  . Diabetes Sister   . Cancer  Other        parent  . Diabetes Other        parent  . Heart disease Other        parent  . Hyperlipidemia Other        parent  . Hypertension Other        parent  . Arthritis Other        parent  . Diabetes Maternal Aunt   . Diabetes Maternal Uncle   . Breast cancer Neg Hx     Past Surgical History:  Procedure Laterality Date  . BREAST BIOPSY  2012  . CARDIAC CATHETERIZATION N/A 02/06/2015   Procedure: Left Heart Cath and Coronary Angiography;  Surgeon: Larey Dresser, MD;  Location: [facility];  Service: Cardiovascular;  Laterality: N/A;    ROS: Review of Systems Negative except as above  PHYSICAL EXAM: BP (!) 165/108   Pulse 97   Temp 98.4 F (36.9 C) (Oral)   Resp 16   Ht 5' 4" (1.626 m)   Wt 274 lb 12.8 oz (124.6 kg)   SpO2 98%   BMI 47.17 kg/m   Wt Readings from Last 3 Encounters:  06/15/18 274 lb 12.8 oz (124.6 kg)  01/09/18 259 lb 12.8 oz (117.8 kg)  12/30/17 270 lb 0.1 oz (122.5 kg)    Physical Exam  General appearance - alert, well appearing, and in no distress Mental status - normal mood, behavior, speech, dress, motor activity, and thought processes Neck - supple, no significant adenopathy Chest - clear to auscultation, no wheezes, rales or rhonchi, symmetric air entry Heart - normal rate, regular rhythm, normal S1, S2, no murmurs, rubs, clicks or gallops Pelvic -CMA [place] present: Normal external genitalia.  Small pea-sized cyst palpated on the left lower labia majora.  Atrophy of the vaginal mucosa.  She is noted to have small amount of blood around the cervix and in the vagina. Extremities - peripheral pulses normal, no pedal edema, no clubbing or cyanosis   Results for orders placed or performed in visit on 06/15/18  POCT glucose (manual entry)  Result Value Ref Range   POC Glucose 290 (A) 70 - 99 mg/dl  POCT glycosylated hemoglobin (Hb  A1C)  Result Value Ref Range   Hemoglobin A1C     HbA1c POC (<> result, manual entry)     HbA1c, POC (prediabetic range)     HbA1c, POC (controlled diabetic range) 11.7 (A) 0.0 - 7.0 %    ASSESSMENT AND PLAN: 1. Post-menopausal bleeding - Cytology - PAP - US Pelvis Complete; Future - US Transvaginal Non-OB; Future - Ambulatory referral to Gynecology  2. History of cancer of vagina - Ambulatory referral to Gynecology  3. Uncontrolled type 2 diabetes mellitus with peripheral neuropathy ([place]) Discussed the importance of healthy eating habits, regular aerobic exercise (at least 150 minutes a week as tolerated) and medication compliance to achieve or maintain control of diabetes. -Continue Lantus and NovoLog at current doses.  Add Trulicity. Advised to continue checking blood sugars and bring in readings on next visit. - POCT glucose (manual entry) - POCT glycosylated hemoglobin (Hb A1C) - Microalbumin / creatinine urine ratio - Insulin Glargine (LANTUS SOLOSTAR) 100 UNIT/ML Solostar Pen; Inject 65 Units into the skin daily.  Dispense: 15 mL; Refill: 2 - CBC - Comprehensive metabolic panel - Dulaglutide (TRULICITY) 0.96 GE/3.6OQ SOPN; Inject 0.75 mg into the skin once a week.  Dispense: 4 pen; Refill: 4 - Amb ref to Medical Nutrition Therapy-MNT  4. Severe obesity (BMI >= 40) ([facility]) See #1 above - Amb ref to Medical Nutrition Therapy-MNT  5. Essential hypertension Not at goal but patient has not taken medication as yet for today.  We will recheck blood pressure on follow-up visit  6. Adrenal adenoma, left We will screen for Cushing's and pheochromocytoma on a follow-up visit  Patient was given the opportunity to ask questions.  Patient verbalized understanding of the plan and was able to repeat key elements of the plan.   Orders Placed This Encounter  Procedures  . US Pelvis Complete  . US Transvaginal Non-OB  . Microalbumin / creatinine urine ratio  . CBC  . Comprehensive  metabolic panel  . Ambulatory referral to Gynecology  . Amb ref to Medical Nutrition Therapy-MNT  . POCT glucose (manual entry)  . POCT glycosylated hemoglobin (Hb A1C)     Requested Prescriptions   Signed Prescriptions Disp Refills  . Insulin Glargine (LANTUS SOLOSTAR) 100 UNIT/ML Solostar Pen 15 mL 2    Sig: Inject 65 Units into the skin daily.  . Dulaglutide (TRULICITY) 6.55 VZ/4.8OL SOPN 4 pen 4    Sig: Inject 0.75 mg into the skin once a week.    Return in about 1 month (around 07/16/2018).  Karle Plumber, MD, FACP

## 2018-06-15 NOTE — Patient Instructions (Signed)
We have added a medication called Trulicity that she will take once weekly to help better control your diabetes.  We have referred you to a nutritionist for dietary counseling.  You have been referred to a gynecologist for further evaluation of this vaginal bleeding.  Follow a Healthy Eating Plan - You can do it! Limit sugary drinks.  Avoid sodas, sweet tea, sport or energy drinks, or fruit drinks.  Drink water, lo-fat milk, or diet drinks. Limit snack foods.   Cut back on candy, cake, cookies, chips, ice cream.  These are a special treat, only in small amounts. Eat plenty of vegetables.  Especially dark green, red, and orange vegetables. Aim for at least 3 servings a day. More is better! Include fruit in your daily diet.  Whole fruit is much healthier than fruit juice! Limit "white" bread, "white" pasta, "white" rice.   Choose "100% whole grain" products, brown or wild rice. Avoid fatty meats. Try "Meatless Monday" and choose eggs or beans one day a week.  When eating meat, choose lean meats like chicken, [place], and fish.  Grill, broil, or bake meats instead of frying, and eat poultry without the skin. Eat less salt.  Avoid frozen pizzas, frozen dinners and salty foods.  Use seasonings other than salt in cooking.  This can help blood pressure and keep you from swelling Beer, wine and liquor have calories.  If you can safely drink alcohol, limit to 1 drink per day for women, 2 drinks for men

## 2018-06-15 NOTE — Progress Notes (Signed)
11

## 2018-06-16 LAB — COMPREHENSIVE METABOLIC PANEL
ALT: 14 IU/L (ref 0–32)
AST: 14 IU/L (ref 0–40)
Albumin/Globulin Ratio: 1.1 — ABNORMAL LOW (ref 1.2–2.2)
Albumin: 3.8 g/dL (ref 3.5–5.5)
Alkaline Phosphatase: 113 IU/L (ref 39–117)
BUN/Creatinine Ratio: 17 (ref 9–23)
BUN: 20 mg/dL (ref 6–24)
Bilirubin Total: 0.5 mg/dL (ref 0.0–1.2)
CALCIUM: 9.3 mg/dL (ref 8.7–10.2)
CO2: 21 mmol/L (ref 20–29)
Chloride: 98 mmol/L (ref 96–106)
Creatinine, Ser: 1.19 mg/dL — ABNORMAL HIGH (ref 0.57–1.00)
GFR calc Af Amer: 58 mL/min/{1.73_m2} — ABNORMAL LOW (ref >59)
GFR calc non Af Amer: 50 mL/min/{1.73_m2} — ABNORMAL LOW (ref >59)
Globulin, Total: 3.4 g/dL (ref 1.5–4.5)
Glucose: 239 mg/dL — ABNORMAL HIGH (ref 65–99)
Potassium: 3.1 mmol/L — ABNORMAL LOW (ref 3.5–5.2)
Sodium: 139 mmol/L (ref 134–144)
Total Protein: 7.2 g/dL (ref 6.0–8.5)

## 2018-06-16 LAB — CBC
Hematocrit: 40.3 % (ref 34.0–46.6)
Hemoglobin: 13.4 g/dL (ref 11.1–15.9)
MCH: 29.2 pg (ref 26.6–33.0)
MCHC: 33.3 g/dL (ref 31.5–35.7)
MCV: 88 fL (ref 79–97)
PLATELETS: 229 10*3/uL (ref 150–450)
RBC: 4.59 x10E6/uL (ref 3.77–5.28)
RDW: 13.6 % (ref 11.7–15.4)
WBC: 12.3 10*3/uL — AB (ref 3.4–10.8)

## 2018-06-16 LAB — MICROALBUMIN / CREATININE URINE RATIO
Creatinine, Urine: 248.9 mg/dL
Microalb/Creat Ratio: 71.4 mg/g creat — ABNORMAL HIGH (ref 0.0–30.0)
Microalbumin, Urine: 177.6 ug/mL

## 2018-06-16 NOTE — BH Specialist Note (Signed)
Integrated Behavioral Health Initial Visit  MRN: [id number] Name: Kimberly Frazier  Number of [place] Clinician visits:: 1/6 Session Start time: 9:25 AM  Session End time: 9:55 AM Total time: 30 minutes  Type of Service: [place] Interpretor:No. Interpretor Name and Language: N/A   Warm Hand Off Completed.       SUBJECTIVE: Kimberly Frazier is a 58 y.o. female accompanied by self Patient was referred by Dr. Wynetta Emery for depression and anxiety. Patient reports the following symptoms/concerns: Pt reports difficulty managing mental and physical health. Duration of problem: Ongoing; Severity of problem: severe  OBJECTIVE: Mood: Anxious and Depressed and Affect: Appropriate Risk of harm to self or others: No plan to harm self or others Pt scored positive on phq-9; however, denies current SI/HI. Protective factors were identified, safety plan was discussed, and crisis intervention resources were provided.  LIFE CONTEXT: Family and Social: Pt has an adult son Licensed conveyancer.) and daughter Merchandiser, retail, [place]) School/Work: Pt has an [employer]. She was laid off of work after 19 years Self-Care: Pt reports an increase in snacking to cope with stressors. As a result, she has gained weight Life Changes: Pt reports difficulty managing mental and physical health.  GOALS ADDRESSED: Patient will: 1. Reduce symptoms of: anxiety and depression 2. Increase knowledge and/or ability of: coping skills and healthy habits  3. Demonstrate ability to: Increase healthy adjustment to current life circumstances, Increase adequate support systems for patient/family and Increase motivation to adhere to plan of care  INTERVENTIONS: Interventions utilized: Behavioral Activation, Supportive Counseling and Psychoeducation and/or Health Education  Standardized Assessments completed: GAD-7 and PHQ 2&9 with C-SSRS  ASSESSMENT: Patient currently experiencing depression and  anxiety triggered by difficulty managing mental and physical health. Pt shared that she was laid off, after working nineteen years. She has ongoing medical conditions that also negatively impacts pt's mental health. Pt has adult children; however, doesn't utilize them for support for fear of overwhelming them. Pt scored positive on phq-9; however, denies current SI/HI. Protective factors were identified, safety plan was discussed, and crisis intervention resources were provided.   Patient may benefit from psychoeducation, psychotherapy, and medication management. LCSWA educated pt on the correlation between one's physical and mental health, in addition, to how stress can negatively impact both. Mental health benefits of exercise were discussed, as well, as activities that can be done in and out of the home. Pt was agreeable to meeting with Benard Halsted, CPP to discuss nutrition for diabetes management.   PLAN: 1. Follow up with behavioral health clinician on : Pt was encouraged to contact LCSWA if symptoms worsen or fail to improve to schedule behavioral appointments at [facility]. 2. Behavioral recommendations: LCSWA recommends that pt apply healthy coping skills discussed and utilize provided resources. Pt is encouraged to schedule follow up appointment with LCSWA 3. Referral(s): [place] (In Clinic) 4. "From scale of 1-10, how likely are you to follow plan?":   Rebekah Chesterfield, LCSW 06/19/2018 3:16 PM

## 2018-06-17 ENCOUNTER — Telehealth: Payer: Self-pay | Admitting: Internal Medicine

## 2018-06-17 NOTE — Telephone Encounter (Signed)
Phone call placed to patient this morning.  Received recording that the subscriber is not accepting messages at this time.  MyChart message sent with lab results.

## 2018-06-19 LAB — CYTOLOGY - PAP
Candida vaginitis: NEGATIVE
Chlamydia: NEGATIVE
Diagnosis: NEGATIVE
HPV: NOT DETECTED
Neisseria Gonorrhea: NEGATIVE
Trichomonas: NEGATIVE

## 2018-06-20 ENCOUNTER — Ambulatory Visit (HOSPITAL_COMMUNITY)
Admission: RE | Admit: 2018-06-20 | Discharge: 2018-06-20 | Disposition: A | Discharge status: Home / Self Care | Payer: Medicaid Other | Source: Ambulatory Visit | Attending: Internal Medicine | Admitting: Internal Medicine

## 2018-06-20 DIAGNOSIS — N950 Postmenopausal bleeding: Secondary | ICD-10-CM | POA: Diagnosis present

## 2018-06-21 ENCOUNTER — Telehealth: Payer: Self-pay | Admitting: Internal Medicine

## 2018-06-21 NOTE — Telephone Encounter (Signed)
Pt returned call this afternoon. Provided pt with pcp message. Informed pt that the referral has been placed and informed her that the referral was sent to the [facility] [place] [facility]. Informed pt that they will contact her to schedule but she can contact them to get an appointment sooner and I can provide her the number. Pt states she will wait till they call her to schedule

## 2018-06-21 NOTE — Telephone Encounter (Signed)
Phone call placed to patient today to discuss the results of pelvic ultrasound.  I left a message on her voicemail identifying myself and the reason for the call.  I requested that she give a call back to the office as soon as possible.  I also tried to reach her daughter caring whose phone contact is on her chart.  I got a voicemail but did not leave a message. Will have my CMA try to reach her again

## 2018-06-22 ENCOUNTER — Other Ambulatory Visit: Payer: Self-pay | Admitting: Pharmacist

## 2018-06-22 ENCOUNTER — Telehealth: Payer: Self-pay | Admitting: Internal Medicine

## 2018-06-22 MED ORDER — GLUCOSE BLOOD VI STRP: ORAL_STRIP | 12 refills | Status: DC

## 2018-06-22 MED FILL — TRUE METRIX TEST STRIP: 100 days supply | Qty: 100 | Fill #0

## 2018-06-22 NOTE — Telephone Encounter (Signed)
-----   Message from Ena Dawley sent at 06/22/2018  5:04 PM EST ----- Patient has an appointment 07/19/2018 @ 9:15am  at [facility]  ----- Message ----- From: Ladell Pier, MD Sent: 06/21/2018  10:57 AM EST To: Ena Dawley  Please see if we can get her in with a gynecologist as soon as possible.  She had a pelvic ultrasound done as further work-up of her postmenopausal bleeding, and this revealed a mass in the uterus and on the cervix.  Patient has past history of vaginal cancer.

## 2018-06-22 NOTE — Telephone Encounter (Signed)
Contacted pt and went over lab results pt is aware   Pt states she [facility]'t [facility] I provided pt the number to get in touch with them to schedule appointment

## 2018-06-22 NOTE — Progress Notes (Signed)
[id number] 

## 2018-06-26 MED FILL — ?ATORVASTATIN 40MG TABLET: 40 | 30 days supply | Qty: 30 | Fill #1

## 2018-07-06 ENCOUNTER — Ambulatory Visit: Payer: Self-pay | Admitting: Internal Medicine

## 2018-07-10 MED FILL — !TRULICITY 0.75 MG/0.5 ML P: 0.75 | 28 days supply | Qty: 2 | Fill #1

## 2018-07-10 MED FILL — ?HUMALOG 100 UNITS/ML VIAL: 100 | 23 days supply | Qty: 20 | Fill #1

## 2018-07-19 ENCOUNTER — Ambulatory Visit: Payer: Self-pay | Admitting: Clinical

## 2018-07-19 ENCOUNTER — Encounter: Payer: Self-pay | Admitting: Obstetrics and Gynecology

## 2018-07-19 ENCOUNTER — Ambulatory Visit (INDEPENDENT_AMBULATORY_CARE_PROVIDER_SITE_OTHER): Payer: Self-pay | Admitting: Obstetrics and Gynecology

## 2018-07-19 VITALS — BP 174/125 | HR 94 | Wt 275.4 lb

## 2018-07-19 DIAGNOSIS — N950 Postmenopausal bleeding: Secondary | ICD-10-CM

## 2018-07-19 DIAGNOSIS — F339 Major depressive disorder, recurrent, unspecified: Secondary | ICD-10-CM | POA: Insufficient documentation

## 2018-07-19 DIAGNOSIS — F331 Major depressive disorder, recurrent, moderate: Secondary | ICD-10-CM

## 2018-07-19 DIAGNOSIS — Z113 Encounter for screening for infections with a predominantly sexual mode of transmission: Secondary | ICD-10-CM

## 2018-07-19 DIAGNOSIS — N898 Other specified noninflammatory disorders of vagina: Secondary | ICD-10-CM

## 2018-07-19 DIAGNOSIS — F329 Major depressive disorder, single episode, unspecified: Secondary | ICD-10-CM

## 2018-07-19 DIAGNOSIS — F32A Depression, unspecified: Secondary | ICD-10-CM

## 2018-07-19 MED ORDER — MISOPROSTOL 200 MCG PO TABS: ORAL_TABLET | ORAL | 1 refills | Status: DC

## 2018-07-19 MED FILL — miSOPROStol 200 MCG TABS: 200 | 1 days supply | Qty: 4 | Fill #0

## 2018-07-19 NOTE — BH Specialist Note (Signed)
Integrated Behavioral Health Initial Visit  MRN: [id number] Name: Kimberly Frazier  Number of [place] [facility] Clinician visits:: 1/6 Session Start time: 10:00  Session End time: 10:14 Total time: 15 minutes  Type of Service: Strum Interpretor:No. Interpretor Name and Language: n/a   Warm Hand Off Completed.       SUBJECTIVE: Kimberly Frazier is a 58 y.o. female accompanied by adult daughter Patient was referred by Mora Bellman, MD for depression. Patient reports the following symptoms/concerns: Pt's primary goal is to find part-time work. Pt states she still has her safety plan she created with [place] at [facility], denies current SI, denies plan, agrees to continue to follow plan if SI returns. Pt is very open to taking home additional educational materials and finding out about apps she can use as self-coping strategies. Pt agrees to follow up with [facility] at [facility].  Duration of problem: Ongoing; Severity of problem: severe  OBJECTIVE: Mood: Anxious and Depressed and Affect: Appropriate Risk of harm to self or others: No plan to harm self or others  LIFE CONTEXT: Family and Social: Family is supportive School/Work: Pt actively looking for work Self-Care: Pt recognizes a greater need for self-care Life Changes: unemployment; health issues  GOALS ADDRESSED: Patient will: 1. Reduce symptoms of: anxiety, depression and stress 2. Increase knowledge and/or ability of: healthy habits and stress reduction  3. Demonstrate ability to: Increase healthy adjustment to current life circumstances and Increase adequate support systems for patient/family  INTERVENTIONS: Interventions utilized: Psychoeducation and/or Health Education and Link to [employer]  Standardized Assessments completed: GAD-7 and PHQ 9  ASSESSMENT: Patient currently experiencing Moderate episode of recurrent major depressive disorder   Patient may benefit  from psychoeducation and brief therapeutic interventions regarding coping with symptoms of depression and anxiety .  PLAN: 1. Follow up with behavioral health clinician on :one week with [facility] at [facility], at upcoming visit with her PCP 2. Behavioral recommendations:  -Continue to follow Safety Plan, created on 06/15/18 at [facility] -Read educational materials regarding coping with symptoms of depression, anxiety, and how antidepressants work -Education administrator of choice for additional self-coping, as needed -Use [employer] as additional resource for job search (updating resume; relevant class) -Consider establishing care for ongoing therapy Consulting civil engineer or [employer]) 3. Referral(s): [employer]:  job search 4. "From scale of 1-10, how likely are you to follow plan?": 10  Garlan Fair, LCSW  Depression screen [facility] 2/9 07/19/2018 06/15/2018 01/09/2018 06/07/2017 04/18/2017  Decreased Interest 3 3 3 3  0  Down, Depressed, Hopeless 2 3 3 3  0  PHQ - 2 Score 5 6 6 6  0  Altered sleeping 1 2 3 3 1   Tired, decreased energy 3 3 3 3 3   Change in appetite 2 3 3 3 3   Feeling bad or failure about yourself  2 3 3 3 3   Trouble concentrating 3 3 3 3 2   Moving slowly or fidgety/restless 3 1 3 2 3   Suicidal thoughts 1 3 2 2  -  PHQ-9 Score 20 24 26 25 15    GAD 7 : Generalized Anxiety Score 07/19/2018 06/15/2018 01/09/2018 06/07/2017  Nervous, Anxious, on Edge 1 2 (No Data) 2  Control/stop worrying 2 2 3 3   Worry too much - different things 3 2 3 3   Trouble relaxing 2 2 3 3   Restless 1 1 (No Data) 2  Easily annoyed or irritable 1 1 2 2   Afraid - awful might happen 2 1 (  No Data) 2  Total GAD 7 Score 12 11 - 17

## 2018-07-19 NOTE — Addendum Note (Signed)
Addended by: Mora Bellman on: 07/19/2018 10:05 AM   Modules accepted: Orders

## 2018-07-19 NOTE — Progress Notes (Signed)
58 yo P2 with BMI 47 postmenopausal here for the evaluation of postmenopausal vaginal bleeding. Patient reports onset of vaginal bleeding in August-September 2019. She has previously been amenorrheic since 1999 following chemo and radiation treatment for ? Vaginal cancer. Patient reports daily vaginal spotting with occasional passage of small clots. She states that 1 day she experienced heavy flow with passage of large clots. She reports the presence of a vaginal spell. Patient is sexually active once a year and is without complaints. Vaginal bleeding does not correlate with sexual intercourse   Past Medical History:  Diagnosis Date  . Arthritis   . Depression   . Diabetes mellitus   . Hyperlipidemia   . Hypertension   . Sepsis ([place]) 12/2017   Past Surgical History:  Procedure Laterality Date  . BREAST BIOPSY  2012  . CARDIAC CATHETERIZATION N/A 02/06/2015   Procedure: Left Heart Cath and Coronary Angiography;  Surgeon: Larey Dresser, MD;  Location: [facility];  Service: Cardiovascular;  Laterality: N/A;   Family History  Problem Relation Age of Onset  . Heart disease Mother   . Hypertension Mother   . Diabetes Mother   . Cancer Father        hodgkins lymphoma  . Heart disease Sister        heart attack  . Diabetes Sister   . Cancer Other        parent  . Diabetes Other        parent  . Heart disease Other        parent  . Hyperlipidemia Other        parent  . Hypertension Other        parent  . Arthritis Other        parent  . Diabetes Maternal Aunt   . Diabetes Maternal Uncle   . Breast cancer Neg Hx    Social History   Tobacco Use  . Smoking status: Never Smoker  . Smokeless tobacco: Never Used  Substance Use Topics  . Alcohol use: Yes    Comment: occasional  . Drug use: Yes    Types: Marijuana   ROS See pertinent in HPI  Blood pressure (!) 174/125, pulse 94, weight 275 lb 6.4 oz (124.9 kg). GENERAL: Well-developed, well-nourished female in no  acute distress.  ABDOMEN: Soft, nontender, nondistended. No organomegaly. PELVIC: Normal external female genitalia. Vagina and cervix are atrophic and white. Uterus is normal in size. No adnexal mass or tenderness. EXTREMITIES: No cyanosis, clubbing, or edema, 2+ distal pulses.  US Transvaginal Non-ob  Result Date: 06/21/2018 CLINICAL DATA:  Postmenopausal bleeding EXAM: TRANSABDOMINAL AND TRANSVAGINAL ULTRASOUND OF PELVIS TECHNIQUE: Both transabdominal and transvaginal ultrasound examinations of the pelvis were performed. Transabdominal technique was performed for global imaging of the pelvis including uterus, ovaries, adnexal regions, and pelvic cul-de-sac. It was necessary to proceed with endovaginal exam following the transabdominal exam to visualize the uterus endometrium ovaries. COMPARISON:  07/20/2007 ultrasound FINDINGS: Uterus Measurements: 7.6 x 3.9 x 4.9 cm = volume: 76.9 mL. Hypoechoic masslike area at the cervix measuring 1.6 x 1.4 x 1.9 cm. Endometrium Thickness: 14.1 mm.  Endometrial mass measuring 2.6 x 1.1 x 1.5 cm Right ovary Not seen Left ovary Not seen Other findings No abnormal free fluid. IMPRESSION: 1. Fluid within the endometrial canal, silhouetting a possible 2.6 cm endometrial mass. In the setting of post-menopausal bleeding, endometrial sampling is indicated to exclude carcinoma. If results are benign, sonohysterogram should be considered for focal lesion  work-up prior to hysteroscopy. (Ref: Radiological Reasoning: Algorithmic Workup of Abnormal Vaginal Bleeding with Endovaginal Sonography and Sonohysterography. AJR 2008; 268:T41-96) 2. Additional finding of possible hypoechoic cervical mass measuring 1.9 cm. Electronically Signed   By: Donavan Foil M.D.   On: 06/21/2018 03:13   US Pelvis Complete  Result Date: 06/21/2018 CLINICAL DATA:  Postmenopausal bleeding EXAM: TRANSABDOMINAL AND TRANSVAGINAL ULTRASOUND OF PELVIS TECHNIQUE: Both transabdominal and transvaginal ultrasound  examinations of the pelvis were performed. Transabdominal technique was performed for global imaging of the pelvis including uterus, ovaries, adnexal regions, and pelvic cul-de-sac. It was necessary to proceed with endovaginal exam following the transabdominal exam to visualize the uterus endometrium ovaries. COMPARISON:  07/20/2007 ultrasound FINDINGS: Uterus Measurements: 7.6 x 3.9 x 4.9 cm = volume: 76.9 mL. Hypoechoic masslike area at the cervix measuring 1.6 x 1.4 x 1.9 cm. Endometrium Thickness: 14.1 mm.  Endometrial mass measuring 2.6 x 1.1 x 1.5 cm Right ovary Not seen Left ovary Not seen Other findings No abnormal free fluid. IMPRESSION: 1. Fluid within the endometrial canal, silhouetting a possible 2.6 cm endometrial mass. In the setting of post-menopausal bleeding, endometrial sampling is indicated to exclude carcinoma. If results are benign, sonohysterogram should be considered for focal lesion work-up prior to hysteroscopy. (Ref: Radiological Reasoning: Algorithmic Workup of Abnormal Vaginal Bleeding with Endovaginal Sonography and Sonohysterography. AJR 2008; 222:L79-89) 2. Additional finding of possible hypoechoic cervical mass measuring 1.9 cm. Electronically Signed   By: Donavan Foil M.D.   On: 06/21/2018 03:13     A/P 58 yo with postmenopausal vaginal bleeding - ENDOMETRIAL BIOPSY     The indications for endometrial biopsy were reviewed.   Risks of the biopsy including cramping, bleeding, infection, uterine perforation, inadequate specimen and need for additional procedures  were discussed. The patient states she understands and agrees to undergo procedure today. Consent was signed. Time out was performed. A sterile speculum was placed in the patient's vagina and the cervix was prepped with Betadine. A single-toothed tenaculum was placed on the anterior lip of the cervix to stabilize it. The uterine cavity could not be sounded due to cervical stenosis.  - Patient will be scheduled for D&C  with hysteroscopy and premedication with cytotec - patient verbalized understanding and all questions were answered

## 2018-07-20 ENCOUNTER — Encounter (HOSPITAL_COMMUNITY): Payer: Self-pay

## 2018-07-20 LAB — CERVICOVAGINAL ANCILLARY ONLY
BACTERIAL VAGINITIS: NEGATIVE
Candida vaginitis: NEGATIVE
Chlamydia: NEGATIVE
Neisseria Gonorrhea: NEGATIVE
Trichomonas: NEGATIVE

## 2018-07-21 MED FILL — POTASSIUM CL ER 8 MEQ CAP: 8 | 30 days supply | Qty: 60 | Fill #2

## 2018-07-25 ENCOUNTER — Other Ambulatory Visit: Payer: Self-pay | Admitting: Internal Medicine

## 2018-07-25 DIAGNOSIS — I2510 Atherosclerotic heart disease of native coronary artery without angina pectoris: Secondary | ICD-10-CM

## 2018-07-25 DIAGNOSIS — I10 Essential (primary) hypertension: Secondary | ICD-10-CM

## 2018-07-26 ENCOUNTER — Encounter (HOSPITAL_BASED_OUTPATIENT_CLINIC_OR_DEPARTMENT_OTHER): Payer: Self-pay | Admitting: *Deleted

## 2018-07-26 MED FILL — ?CARVEDILOL 25 MG TABLET: 25 | 30 days supply | Qty: 60 | Fill #0

## 2018-07-28 ENCOUNTER — Encounter (HOSPITAL_BASED_OUTPATIENT_CLINIC_OR_DEPARTMENT_OTHER): Payer: Self-pay | Admitting: *Deleted

## 2018-07-28 ENCOUNTER — Ambulatory Visit: Payer: Self-pay | Admitting: Internal Medicine

## 2018-07-28 ENCOUNTER — Other Ambulatory Visit: Payer: Self-pay

## 2018-07-28 NOTE — Progress Notes (Signed)
Anesthesia Chart Review   Case:  [id number] Date/Time:  08/01/18 1200   Procedure:  DILATATION AND CURETTAGE /HYSTEROSCOPY (N/A )   Anesthesia type:  Choice   Pre-op diagnosis:  PMB   Location:  [facility] OR ROOM 1 / [place]   Surgeon:  Mora Bellman, MD      DISCUSSION: 58 yo never smoker with h/o HTN, depression, DM II, HLD, left adrenal adenoma, h/o vaginal cancer (s/p radiation 1999), CKD Stage II, CAD, PMB, uterine mass scheduled for above procedure 08/01/18 with Dr. Mora Bellman.    Last seen by PCP, Dr. Karle Plumber, 06/15/18.  At this visit BP 165/108, per Dr. Wynetta Emery pt had not yet taken her HTN meds that day.  At [place] with Dr. Elly Modena BP reported 174/125. Adjusted made to DM II medications at PCP visit on 06/15/18,  Blood glucose 239, A1C 11.7.   Last seen by cardiologist, Dr. Mertie Moores, 04/10/2015.  At this visit pt was doing well on medical therapy for CAD.   Discussed with Dr. Conrad Byron, ok to proceed at surgery center.  Discussed with surgeon need to optimize BP prior to surgery.  VS: Ht _0  (1.626 m)   Wt 124.7 kg   BMI 47.20 kg/m   PROVIDERS: Ladell Pier, MD is PCP    LABS: Labs DOS (all labs ordered are listed, but only abnormal results are displayed)  Labs Reviewed - No data to display   IMAGES: US Transvaginal 06/20/2018 IMPRESSION: 1. Fluid within the endometrial canal, silhouetting a possible 2.6 cm endometrial mass. In the setting of post-menopausal bleeding, endometrial sampling is indicated to exclude carcinoma. If results are benign, sonohysterogram should be considered for focal lesion work-up prior to hysteroscopy. (Ref: Radiological Reasoning: Algorithmic Workup of Abnormal Vaginal Bleeding with Endovaginal Sonography and Sonohysterography. AJR 2008; 211:H41-74) 2. Additional finding of possible hypoechoic cervical mass measuring 1.9 cm.  EKG:   CV: Cardiac Cath 02/06/2015 The patient has 80% distal LAD and 80% mid PDA  stenoses.  The PDA is a relatively small vessel.  As Cardiolite was not high risk, I would plan initial medical management here.  I think PCI would be technically possible but given the distal location in the LAD and the relatively small caliber of the PDA, I think medical management initially is the best plan.   The RCA was difficult to engage, located anteriorly, multiple catheters attempted, able to engage with EZ rad right catheter.  Will hydrate aggressively post-procedure.   Myocardial Perfusion Imaging 01/21/2015  Nuclear stress EF: 60%.  There was no ST segment deviation noted during stress.  The left ventricular ejection fraction is normal (55-65%).   Low risk stress nuclear study with a small, moderate intensity, reversible lateral defect consistent with mild lateral ischemia; EF 60 with normal wall motion.  Past Medical History:  Diagnosis Date  . Adrenal adenoma, left   . Arthritis   . CKD (chronic kidney disease), stage II   . Coronary artery disease 07-28-2018 [facility]  currently due to no insurance   per cardiac cath 02-06-2015 (positive mild lateral ishcemia on stress test)--- dLAD 80%,  mLAD 40%,  mPDA 80% (small vessel),  ostial D1 70%,  CFx with lumial irregarlities-- medical management  . Depression   . Diabetic neuropathy ([place])   . History of Bell's palsy 07/2011   per pt residual facial pain on left side occasionally  . History of cancer of vagina 1999   per pt completed  radiation and chemo  . History of sepsis 12/30/2017   positive blood culter fro E.coli  . Hyperlipidemia   . Hypertension   . Hypokalemia   . Insulin dependent type 2 diabetes mellitus, uncontrolled ([place])    followed by pcp---  A1c was 11.7 on 06-15-2018 in epic  . Nocturia   . Peripheral neuropathy   . PMB (postmenopausal bleeding)   . Wears dentures    upper  . Wears glasses     Past Surgical History:  Procedure Laterality Date  . BREAST BIOPSY  2012    benign  . CARDIAC CATHETERIZATION N/A 02/06/2015   Procedure: Left Heart Cath and Coronary Angiography;  Surgeon: Larey Dresser, MD;  Location: [facility];  Service: Cardiovascular;  Laterality: N/A;  . UMBILICAL HERNIA REPAIR  child    MEDICATIONS: No current facility-administered medications for this encounter.    Marland Kitchen aspirin EC 81 MG tablet  . atorvastatin (LIPITOR) 40 MG tablet  . carvedilol (COREG) 25 MG tablet  . Dulaglutide (TRULICITY) 7.07 AJ/5.1ID SOPN  . gabapentin (NEURONTIN) 300 MG capsule  . HUMALOG 100 UNIT/ML injection  . nitroGLYCERIN (NITROSTAT) 0.4 MG SL tablet  . potassium chloride (KLOR-CON) 8 MEQ tablet  . BD PEN NEEDLE NANO U/F 32G X 4 MM MISC  . Blood Glucose Monitoring Suppl (TRUE METRIX METER) w/Device KIT  . glucose blood (TRUE METRIX BLOOD GLUCOSE TEST) test strip  . Insulin Glargine (LANTUS SOLOSTAR) 100 UNIT/ML Solostar Pen  . Insulin Pen Needle (ULTICARE MICRO PEN NEEDLES) 32G X 4 MM MISC  . misoprostol (CYTOTEC) 200 MCG tablet  . TRUEPLUS LANCETS 28G MISC     [facility] Pre-Surgical Testing [phone] 07/28/18 3:33 PM

## 2018-07-28 NOTE — Anesthesia Preprocedure Evaluation (Addendum)
Anesthesia Evaluation  Patient identified by MRN, date of birth, ID band Patient awake    Reviewed: Allergy & Precautions, NPO status , Patient's Chart, lab work & pertinent test results  Airway Mallampati: II  TM Distance: >3 FB Neck ROM: Full    Dental  (+) Upper Dentures   Pulmonary neg pulmonary ROS,    breath sounds clear to auscultation + decreased breath sounds      Cardiovascular hypertension, + CAD  Normal cardiovascular exam Rhythm:Regular Rate:Normal     Neuro/Psych negative neurological ROS  negative psych ROS   GI/Hepatic negative GI ROS, Neg liver ROS,   Endo/Other  diabetes, Insulin DependentMorbid obesity  Renal/GU negative Renal ROS  negative genitourinary   Musculoskeletal negative musculoskeletal ROS (+)   Abdominal (+) + obese,   Peds negative pediatric ROS (+)  Hematology negative hematology ROS (+)   Anesthesia Other Findings   Reproductive/Obstetrics negative OB ROS                            Anesthesia Physical Anesthesia Plan  ASA: III  Anesthesia Plan: General   Post-op Pain Management:    Induction: Intravenous  PONV Risk Score and Plan: 3 and Ondansetron and Treatment may vary due to age or medical condition  Airway Management Planned: LMA  Additional Equipment:   Intra-op Plan:   Post-operative Plan: Extubation in OR  Informed Consent: I have reviewed the patients History and Physical, chart, labs and discussed the procedure including the risks, benefits and alternatives for the proposed anesthesia with the patient or authorized representative who has indicated his/her understanding and acceptance.     Dental advisory given  Plan Discussed with: CRNA and Surgeon  Anesthesia Plan Comments: (See PAT note 07/28/18, Konrad Felix, PA-C)       Anesthesia Quick Evaluation

## 2018-07-28 NOTE — Progress Notes (Addendum)
Spoke w/ pt via phone for pre-op interview.  Npo after mn.  Arrive at 1000.  Needs cbc and bmp.  Current ekg in chart and epic.  Will take lipitor and coreg am dos w/ sips of water.  Pt verbalized understanding no insulin morning day of surgery and do half dose lantus insulin night before surgery, humalog insulin as ususal the day before surgery.  Anesthesia to review chart, Konrad Felix PA.

## 2018-07-31 NOTE — H&P (Signed)
Kimberly Frazier is an 58 y.o. female P2 postmenopausal here for scheduled dilatation and curettage for the evaluation of postmenopausal vaginal bleeding which started in August 2019. Patient reports daily vaginal spotting which increases to heavy flow and passage of clots occasionally. Patient is without other complaints  Pertinent Gynecological History: Menses: post-menopausal Bleeding: post menopausal bleeding Contraception: none Previous GYN Procedures: none  Last mammogram: normal Date: 05/2017 Last pap: normal Date: 05/2018 OB History: P2   Menstrual History: No LMP recorded. Patient is postmenopausal.    Past Medical History:  Diagnosis Date  . Adrenal adenoma, left   . Arthritis   . CKD (chronic kidney disease), stage II   . Coronary artery disease 07-28-2018 [facility]  currently due to no insurance   per cardiac cath 02-06-2015 (positive mild lateral ishcemia on stress test)--- dLAD 80%,  mLAD 40%,  mPDA 80% (small vessel),  ostial D1 70%,  CFx with lumial irregarlities-- medical management  . Depression   . Diabetic neuropathy ([place])   . History of Bell's palsy 07/2011   per pt residual facial pain on left side occasionally  . History of cancer of vagina 1999   per pt completed radiation and chemo  . History of sepsis 12/30/2017   positive blood culter fro E.coli  . Hyperlipidemia   . Hypertension   . Hypokalemia   . Insulin dependent type 2 diabetes mellitus, uncontrolled ([place])    followed by pcp---  A1c was 11.7 on 06-15-2018 in epic  . Nocturia   . Peripheral neuropathy   . PMB (postmenopausal bleeding)   . Wears dentures    upper  . Wears glasses     Past Surgical History:  Procedure Laterality Date  . BREAST BIOPSY  2012   benign  . CARDIAC CATHETERIZATION N/A 02/06/2015   Procedure: Left Heart Cath and Coronary Angiography;  Surgeon: Larey Dresser, MD;  Location: [facility];  Service: Cardiovascular;  Laterality: N/A;  .  UMBILICAL HERNIA REPAIR  child    Family History  Problem Relation Age of Onset  . Heart disease Mother   . Hypertension Mother   . Diabetes Mother   . Cancer Father        hodgkins lymphoma  . Heart disease Sister        heart attack  . Diabetes Sister   . Cancer Other        parent  . Diabetes Other        parent  . Heart disease Other        parent  . Hyperlipidemia Other        parent  . Hypertension Other        parent  . Arthritis Other        parent  . Diabetes Maternal Aunt   . Diabetes Maternal Uncle   . Breast cancer Neg Hx     Social History:  reports that she has never smoked. She has never used smokeless tobacco. She reports previous alcohol use. She reports previous drug use. Drug: Marijuana.  Allergies: No Known Allergies  Medications Prior to Admission  Medication Sig Dispense Refill Last Dose  . aspirin EC 81 MG tablet Take 1 tablet (81 mg total) daily by mouth. 100 tablet 1 07/31/2018 at Unknown time  . atorvastatin (LIPITOR) 40 MG tablet TAKE 1 TABLET DAILY BY MOUTH. (Patient taking differently: Take 40 mg by mouth every morning. ) 30 tablet 2 08/01/2018 at 0700  .  carvedilol (COREG) 25 MG tablet Take 1 tablet (25 mg total) by mouth 2 (two) times daily. (Patient taking differently: Take 25 mg by mouth 2 (two) times daily. ) 60 tablet 0 08/01/2018 at 0700  . Dulaglutide (TRULICITY) 0.25 EN/2.7PO SOPN Inject 0.75 mg into the skin once a week. (Patient taking differently: Inject 0.75 mg into the skin once a week. Saturday's) 4 pen 4 07/29/2018  . HUMALOG 100 UNIT/ML injection INJECT 28 UNITS INTO THE SKIN THREE TIMES DAILY BEFORE MEALS. (Patient taking differently: Inject 28 Units into the skin 3 (three) times daily with meals. ) 20 mL 2 07/31/2018 at Unknown time  . misoprostol (CYTOTEC) 200 MCG tablet Insert four tablets vaginally the night prior to your appointment 4 tablet 1 07/31/2018 at Unknown time  . potassium chloride (KLOR-CON) 8 MEQ tablet Take 2 tablets (16  mEq total) by mouth daily. (Patient taking differently: Take 8 mEq by mouth 2 (two) times daily. ) 60 tablet 5 07/31/2018 at Unknown time  . BD PEN NEEDLE NANO U/F 32G X 4 MM MISC USE TWICE DAILY AS DIRECTED 100 each 0 Taking  . Blood Glucose Monitoring Suppl (TRUE METRIX METER) w/Device KIT Use as directed 1 kit 0 Taking  . gabapentin (NEURONTIN) 300 MG capsule Take 1 capsule (300 mg total) by mouth at bedtime. (Patient taking differently: Take 300 mg by mouth at bedtime. ) 90 capsule 3 More than a month at Unknown time  . glucose blood (TRUE METRIX BLOOD GLUCOSE TEST) test strip Use as instructed 100 each 12 Taking  . Insulin Glargine (LANTUS SOLOSTAR) 100 UNIT/ML Solostar Pen Inject 65 Units into the skin daily. (Patient taking differently: Inject 65 Units into the skin at bedtime. ) 15 mL 2 Taking  . Insulin Pen Needle (ULTICARE MICRO PEN NEEDLES) 32G X 4 MM MISC Use as directed with insulin 100 each 12 Taking  . nitroGLYCERIN (NITROSTAT) 0.4 MG SL tablet Place 1 tab under tongue for chest pain.  May repeat after 5 minutes x 2.  DO NOT TAKE MORE THAN 3 TABS DURING AN EPISODE OF CHEST PAIN 25 tablet 6 Unknown at Unknown time  . TRUEPLUS LANCETS 28G MISC Use as directted 100 each 6 Taking    ROS See pertinent in HPI Blood pressure (!) 152/100, pulse 90, temperature 98.6 F (37 C), temperature source Oral, resp. rate 18, height 5' 4"  (1.626 m), weight 126.7 kg, SpO2 99 %. Physical Exam GENERAL: Well-developed, well-nourished female in no acute distress.  LUNGS: Clear to auscultation bilaterally.  HEART: Regular rate and rhythm. ABDOMEN: Soft, nontender, nondistended. No organomegaly. PELVIC: Deferred to OR EXTREMITIES: No cyanosis, clubbing, or edema, 2+ distal pulses.  No results found for this or any previous visit (from the past 24 hour(s)).  No results found.  06/20/2018 ultrasound FINDINGS: Uterus  Measurements: 7.6 x 3.9 x 4.9 cm = volume: 76.9 mL. Hypoechoic masslike area at the  cervix measuring 1.6 x 1.4 x 1.9 cm.  Endometrium  Thickness: 14.1 mm.  Endometrial mass measuring 2.6 x 1.1 x 1.5 cm  Right ovary  Not seen  Left ovary  Not seen  Other findings  No abnormal free fluid.  IMPRESSION: 1. Fluid within the endometrial canal, silhouetting a possible 2.6 cm endometrial mass. In the setting of post-menopausal bleeding, endometrial sampling is indicated to exclude carcinoma. If results are benign, sonohysterogram should be considered for focal lesion work-up prior to hysteroscopy. (Ref: Radiological Reasoning: Algorithmic Workup of Abnormal Vaginal Bleeding with Endovaginal Sonography and Sonohysterography.  AJR 2008; 446:F20-76) 2. Additional finding of possible hypoechoic cervical mass measuring 1.9 cm.   Electronically Signed   By: Donavan Foil M.D.   On: 06/21/2018 03:13  Assessment/Plan: 58 yo P2 with postmenopausal vaginal bleeding here for D&C with hysteroscopy - Risks, benefits and alternatives were explained including but not limited to risks of bleeding, infection, uterine perforation or damage to adjacent organs.  - Patient verbalized understanding and all questions were answered. Consent signed  Paelyn Smick 08/01/2018, 10:31 AM

## 2018-08-01 ENCOUNTER — Encounter (HOSPITAL_BASED_OUTPATIENT_CLINIC_OR_DEPARTMENT_OTHER)
Admission: RE | Disposition: A | Discharge status: Home / Self Care | Payer: Self-pay | Source: Home / Self Care | Attending: Obstetrics and Gynecology

## 2018-08-01 ENCOUNTER — Other Ambulatory Visit: Payer: Self-pay

## 2018-08-01 ENCOUNTER — Ambulatory Visit (HOSPITAL_BASED_OUTPATIENT_CLINIC_OR_DEPARTMENT_OTHER): Payer: Medicaid Other | Admitting: Physician Assistant

## 2018-08-01 ENCOUNTER — Encounter (HOSPITAL_BASED_OUTPATIENT_CLINIC_OR_DEPARTMENT_OTHER): Payer: Self-pay | Admitting: *Deleted

## 2018-08-01 ENCOUNTER — Ambulatory Visit (HOSPITAL_BASED_OUTPATIENT_CLINIC_OR_DEPARTMENT_OTHER)
Admission: RE | Admit: 2018-08-01 | Discharge: 2018-08-01 | Disposition: A | Discharge status: Home / Self Care | Payer: Medicaid Other | Attending: Obstetrics and Gynecology | Admitting: Obstetrics and Gynecology

## 2018-08-01 DIAGNOSIS — Z794 Long term (current) use of insulin: Secondary | ICD-10-CM | POA: Insufficient documentation

## 2018-08-01 DIAGNOSIS — Z79899 Other long term (current) drug therapy: Secondary | ICD-10-CM | POA: Diagnosis not present

## 2018-08-01 DIAGNOSIS — N182 Chronic kidney disease, stage 2 (mild): Secondary | ICD-10-CM | POA: Insufficient documentation

## 2018-08-01 DIAGNOSIS — E785 Hyperlipidemia, unspecified: Secondary | ICD-10-CM | POA: Diagnosis not present

## 2018-08-01 DIAGNOSIS — N950 Postmenopausal bleeding: Secondary | ICD-10-CM | POA: Diagnosis present

## 2018-08-01 DIAGNOSIS — E1122 Type 2 diabetes mellitus with diabetic chronic kidney disease: Secondary | ICD-10-CM | POA: Insufficient documentation

## 2018-08-01 DIAGNOSIS — E1140 Type 2 diabetes mellitus with diabetic neuropathy, unspecified: Secondary | ICD-10-CM | POA: Diagnosis not present

## 2018-08-01 DIAGNOSIS — I129 Hypertensive chronic kidney disease with stage 1 through stage 4 chronic kidney disease, or unspecified chronic kidney disease: Secondary | ICD-10-CM | POA: Diagnosis not present

## 2018-08-01 DIAGNOSIS — Z7982 Long term (current) use of aspirin: Secondary | ICD-10-CM | POA: Insufficient documentation

## 2018-08-01 HISTORY — DX: Type 2 diabetes mellitus with hyperglycemia: E11.65

## 2018-08-01 HISTORY — DX: Atherosclerotic heart disease of native coronary artery without angina pectoris: I25.10

## 2018-08-01 HISTORY — DX: Nocturia: R35.1

## 2018-08-01 HISTORY — DX: Polyneuropathy, unspecified: G62.9

## 2018-08-01 HISTORY — DX: Chronic kidney disease, stage 2 (mild): N18.2

## 2018-08-01 HISTORY — DX: Hypokalemia: E87.6

## 2018-08-01 HISTORY — DX: Type 2 diabetes mellitus with diabetic neuropathy, unspecified: E11.40

## 2018-08-01 HISTORY — PX: HYSTEROSCOPY WITH D & C: SHX1775

## 2018-08-01 HISTORY — DX: Reserved for concepts with insufficient information to code with codable children: IMO0002

## 2018-08-01 HISTORY — DX: Long term (current) use of insulin: Z79.4

## 2018-08-01 HISTORY — DX: Benign neoplasm of left adrenal gland: D35.02

## 2018-08-01 HISTORY — DX: Postmenopausal bleeding: N95.0

## 2018-08-01 HISTORY — DX: Presence of dental prosthetic device (complete) (partial): Z97.2

## 2018-08-01 HISTORY — DX: Presence of spectacles and contact lenses: Z97.3

## 2018-08-01 HISTORY — DX: Personal history of malignant neoplasm of other female genital organs: Z85.44

## 2018-08-01 HISTORY — DX: Personal history of other infectious and parasitic diseases: Z86.19

## 2018-08-01 HISTORY — DX: Personal history of other diseases of the nervous system and sense organs: Z86.69

## 2018-08-01 LAB — GLUCOSE, CAPILLARY: Glucose-Capillary: 99 mg/dL (ref 70–99)

## 2018-08-01 SURGERY — DILATATION AND CURETTAGE /HYSTEROSCOPY
Anesthesia: General | Site: Vagina

## 2018-08-01 MED ORDER — SODIUM CHLORIDE 0.9 % IR SOLN
Status: DC | PRN
  Administered 2018-08-01: 3000 mL

## 2018-08-01 MED ORDER — KETOROLAC TROMETHAMINE 30 MG/ML IJ SOLN
INTRAMUSCULAR | Status: DC | PRN
  Administered 2018-08-01: 30 mg via INTRAVENOUS

## 2018-08-01 MED ORDER — LACTATED RINGERS IV SOLN
INTRAVENOUS | Status: DC
  Administered 2018-08-01 (×2): via INTRAVENOUS
  Filled 2018-08-01: qty 1000

## 2018-08-01 MED ORDER — ACETAMINOPHEN 500 MG PO TABS
ORAL_TABLET | ORAL | Status: AC
  Filled 2018-08-01: qty 2

## 2018-08-01 MED ORDER — ONDANSETRON HCL 4 MG/2ML IJ SOLN
INTRAMUSCULAR | Status: AC
  Filled 2018-08-01: qty 2

## 2018-08-01 MED ORDER — LIDOCAINE HCL (PF) 1 % IJ SOLN
INTRAMUSCULAR | Status: DC | PRN
  Administered 2018-08-01: 10 mL

## 2018-08-01 MED ORDER — DEXAMETHASONE SODIUM PHOSPHATE 10 MG/ML IJ SOLN
INTRAMUSCULAR | Status: AC
  Filled 2018-08-01: qty 1

## 2018-08-01 MED ORDER — PROPOFOL 10 MG/ML IV BOLUS
INTRAVENOUS | Status: DC | PRN
  Administered 2018-08-01: 150 mg via INTRAVENOUS

## 2018-08-01 MED ORDER — MIDAZOLAM HCL 2 MG/2ML IJ SOLN
INTRAMUSCULAR | Status: AC
  Filled 2018-08-01: qty 2

## 2018-08-01 MED ORDER — FENTANYL CITRATE (PF) 100 MCG/2ML IJ SOLN
INTRAMUSCULAR | Status: AC
  Filled 2018-08-01: qty 2

## 2018-08-01 MED ORDER — DEXAMETHASONE SODIUM PHOSPHATE 4 MG/ML IJ SOLN
INTRAMUSCULAR | Status: DC | PRN
  Administered 2018-08-01: 4 mg via INTRAVENOUS

## 2018-08-01 MED ORDER — KETOROLAC TROMETHAMINE 30 MG/ML IJ SOLN
INTRAMUSCULAR | Status: AC
  Filled 2018-08-01: qty 1

## 2018-08-01 MED ORDER — PHENYLEPHRINE 40 MCG/ML (10ML) SYRINGE FOR IV PUSH (FOR BLOOD PRESSURE SUPPORT)
PREFILLED_SYRINGE | INTRAVENOUS | Status: DC | PRN
  Administered 2018-08-01 (×3): 80 ug via INTRAVENOUS
  Administered 2018-08-01: 120 ug via INTRAVENOUS

## 2018-08-01 MED ORDER — OXYCODONE-ACETAMINOPHEN 5-325 MG PO TABS: 1.0000 | ORAL_TABLET | Freq: Four times a day (QID) | ORAL | 0 refills | Status: DC | PRN

## 2018-08-01 MED ORDER — ONDANSETRON HCL 4 MG/2ML IJ SOLN
INTRAMUSCULAR | Status: DC | PRN
  Administered 2018-08-01: 4 mg via INTRAVENOUS

## 2018-08-01 MED ORDER — PROPOFOL 10 MG/ML IV BOLUS
INTRAVENOUS | Status: AC
  Filled 2018-08-01: qty 20

## 2018-08-01 MED ORDER — ACETAMINOPHEN 325 MG PO TABS
ORAL_TABLET | ORAL | Status: DC | PRN
  Administered 2018-08-01: 1000 mg via ORAL

## 2018-08-01 MED ORDER — MIDAZOLAM HCL 5 MG/5ML IJ SOLN
INTRAMUSCULAR | Status: DC | PRN
  Administered 2018-08-01: 2 mg via INTRAVENOUS

## 2018-08-01 MED ORDER — IBUPROFEN 600 MG PO TABS: 600.0000 mg | ORAL_TABLET | Freq: Four times a day (QID) | ORAL | 3 refills | Status: DC | PRN

## 2018-08-01 MED ORDER — FENTANYL CITRATE (PF) 100 MCG/2ML IJ SOLN
25.0000 ug | INTRAMUSCULAR | Status: DC | PRN
  Filled 2018-08-01: qty 1

## 2018-08-01 MED ORDER — PROMETHAZINE HCL 25 MG/ML IJ SOLN
6.2500 mg | INTRAMUSCULAR | Status: DC | PRN
  Filled 2018-08-01: qty 1

## 2018-08-01 MED ORDER — LIDOCAINE 2% (20 MG/ML) 5 ML SYRINGE
INTRAMUSCULAR | Status: AC
  Filled 2018-08-01: qty 5

## 2018-08-01 MED ORDER — LIDOCAINE HCL (CARDIAC) PF 100 MG/5ML IV SOSY
PREFILLED_SYRINGE | INTRAVENOUS | Status: DC | PRN
  Administered 2018-08-01: 100 mg via INTRAVENOUS

## 2018-08-01 MED ORDER — FENTANYL CITRATE (PF) 100 MCG/2ML IJ SOLN
INTRAMUSCULAR | Status: DC | PRN
  Administered 2018-08-01: 50 ug via INTRAVENOUS

## 2018-08-01 MED ORDER — ACETAMINOPHEN 500 MG PO TABS
ORAL_TABLET | ORAL | Status: AC
  Filled 2018-08-01: qty 1

## 2018-08-01 MED ORDER — PHENYLEPHRINE 40 MCG/ML (10ML) SYRINGE FOR IV PUSH (FOR BLOOD PRESSURE SUPPORT)
PREFILLED_SYRINGE | INTRAVENOUS | Status: AC
  Filled 2018-08-01: qty 10

## 2018-08-01 MED FILL — IBUPROFEN 600 MG TABLET: 600 | 60 days supply | Qty: 60 | Fill #0

## 2018-08-01 SURGICAL SUPPLY — 31 items
CANISTER SUCT 3000ML PPV (MISCELLANEOUS) ×3 IMPLANT
CATH ROBINSON RED A/P 16FR (CATHETERS) IMPLANT
COVER BACK TABLE 60X90IN (DRAPES) ×3 IMPLANT
COVER WAND RF STERILE (DRAPES) ×6 IMPLANT
DILATOR CANAL MILEX (MISCELLANEOUS) IMPLANT
DRAPE SHEET LG 3/4 BI-LAMINATE (DRAPES) ×3 IMPLANT
DRSG TELFA 3X8 NADH (GAUZE/BANDAGES/DRESSINGS) IMPLANT
ELECT REM PT RETURN 9FT ADLT (ELECTROSURGICAL) ×3
ELECTRODE REM PT RTRN 9FT ADLT (ELECTROSURGICAL) ×1 IMPLANT
GLOVE BIO SURGEON STRL SZ 6.5 (GLOVE) ×2 IMPLANT
GLOVE BIO SURGEONS STRL SZ 6.5 (GLOVE) ×1
GLOVE BIOGEL PI IND STRL 6.5 (GLOVE) IMPLANT
GLOVE BIOGEL PI IND STRL 7.0 (GLOVE) IMPLANT
GLOVE BIOGEL PI IND STRL 7.5 (GLOVE) IMPLANT
GLOVE BIOGEL PI INDICATOR 6.5 (GLOVE) ×2
GLOVE BIOGEL PI INDICATOR 7.0 (GLOVE) ×2
GLOVE BIOGEL PI INDICATOR 7.5 (GLOVE) ×2
GLOVE SURG SS PI 7.0 STRL IVOR (GLOVE) ×3 IMPLANT
GOWN STRL REUS W/TWL LRG LVL3 (GOWN DISPOSABLE) ×6 IMPLANT
KIT TURNOVER CYSTO (KITS) ×3 IMPLANT
LEGGING LITHOTOMY PAIR STRL (DRAPES) ×3 IMPLANT
MANIFOLD NEPTUNE II (INSTRUMENTS) IMPLANT
PACK BASIN DAY SURGERY FS (CUSTOM PROCEDURE TRAY) ×3 IMPLANT
PAD DRESSING TELFA 3X8 NADH (GAUZE/BANDAGES/DRESSINGS) IMPLANT
PAD OB MATERNITY 4.3X12.25 (PERSONAL CARE ITEMS) ×3 IMPLANT
PAD PREP 24X48 CUFFED NSTRL (MISCELLANEOUS) ×3 IMPLANT
TOWEL OR 17X26 10 PK STRL BLUE (TOWEL DISPOSABLE) ×6 IMPLANT
TRAY DSU PREP LF (CUSTOM PROCEDURE TRAY) ×3 IMPLANT
TUBE CONNECTING 12'X1/4 (SUCTIONS)
TUBE CONNECTING 12X1/4 (SUCTIONS) IMPLANT
WATER STERILE IRR 500ML POUR (IV SOLUTION) ×3 IMPLANT

## 2018-08-01 NOTE — Discharge Instructions (Signed)
NO ADVIL, ALEVE, MOTRIN, IBUPROFEN UNTIL 5:30 PM TODAY    Hysteroscopy, Care After This sheet gives you information about how to care for yourself after your procedure. Your health care provider may also give you more specific instructions. If you have problems or questions, contact your health care provider. What can I expect after the procedure? After the procedure, it is common to have:  Cramping.  Bleeding. This can vary from light spotting to menstrual-like bleeding. Follow these instructions at home: Activity  Rest for 1-2 days after the procedure.  Do not douche, use tampons, or have sex for 2 weeks after the procedure, or until your health care provider approves.  Do not drive for 24 hours after the procedure, or for as long as told by your health care provider.  Do not drive, use heavy machinery, or drink alcohol while taking prescription pain medicines. Medicines   Take over-the-counter and prescription medicines only as told by your health care provider.  Do not take aspirin during recovery. It can increase the risk of bleeding. General instructions  Do not take baths, swim, or use a hot tub until your health care provider approves. Take showers instead of baths for 2 weeks, or for as long as told by your health care provider.  To prevent or treat constipation while you are taking prescription pain medicine, your health care provider may recommend that you: ? Drink enough fluid to keep your urine clear or pale yellow. ? Take over-the-counter or prescription medicines. ? Eat foods that are high in fiber, such as fresh fruits and vegetables, whole grains, and beans. ? Limit foods that are high in fat and processed sugars, such as fried and sweet foods.  Keep all follow-up visits as told by your health care provider. This is important. Contact a health care provider if:  You feel dizzy or lightheaded.  You feel nauseous.  You have abnormal vaginal  discharge.  You have a rash.  You have pain that does not get better with medicine.  You have chills. Get help right away if:  You have bleeding that is heavier than a normal menstrual period.  You have a fever.  You have pain or cramps that get worse.  You develop new abdominal pain.  You faint.  You have pain in your shoulders.  You have shortness of breath. Summary  After the procedure, you may have cramping and some vaginal bleeding.  Do not douche, use tampons, or have sex for 2 weeks after the procedure, or until your health care provider approves.  Do not take baths, swim, or use a hot tub until your health care provider approves. Take showers instead of baths for 2 weeks, or for as long as told by your health care provider.  Report any unusual symptoms to your health care provider.  Keep all follow-up visits as told by your health care provider. This is important. This information is not intended to replace advice given to you by your health care provider. Make sure you discuss any questions you have with your health care provider. Document Released: 03/07/2013 Document Revised: 06/15/2016 Document Reviewed: 06/15/2016 Elsevier Interactive Patient Education  2019 [facility] Instructions  Activity: Get plenty of rest for the remainder of the day. A responsible individual must stay with you for 24 hours following the procedure.  For the next 24 hours, DO NOT: -Drive a car -Paediatric nurse -Drink alcoholic beverages -Take any medication  unless instructed by your physician -Make any legal decisions or sign important papers.  Meals: Start with liquid foods such as gelatin or soup. Progress to regular foods as tolerated. Avoid greasy, spicy, heavy foods. If nausea and/or vomiting occur, drink only clear liquids until the nausea and/or vomiting subsides. Call your physician if vomiting continues.  Special  Instructions/Symptoms: Your throat may feel dry or sore from the anesthesia or the breathing tube placed in your throat during surgery. If this causes discomfort, gargle with warm salt water. The discomfort should disappear within 24 hours.  If you had a scopolamine patch placed behind your ear for the management of post- operative nausea and/or vomiting:  1. The medication in the patch is effective for 72 hours, after which it should be removed.  Wrap patch in a tissue and discard in the trash. Wash hands thoroughly with soap and water. 2. You may remove the patch earlier than 72 hours if you experience unpleasant side effects which may include dry mouth, dizziness or visual disturbances. 3. Avoid touching the patch. Wash your hands with soap and water after contact with the patch.

## 2018-08-01 NOTE — Op Note (Signed)
PREOPERATIVE DIAGNOSIS:  Postmenopausal vaginal bleeding. POSTOPERATIVE DIAGNOSIS: The same PROCEDURE: Hysteroscopy, Dilation and Curettage. SURGEON:  Dr. Mora Bellman   INDICATIONS: 58 y.o. [id number]  here for scheduled surgery for postmenopausal vaginal bleeding.   Risks of surgery were discussed with the patient including but not limited to: bleeding which may require transfusion; infection which may require antibiotics; injury to uterus or surrounding organs; intrauterine scarring which may impair future fertility; need for additional procedures including laparotomy or laparoscopy; and other postoperative/anesthesia complications. Written informed consent was obtained.    FINDINGS:  A 8 week size uterus.  Diffuse proliferative endometrium.  Normal ostia bilaterally.  ANESTHESIA:   General, paracervical block. INTRAVENOUS FLUIDS:  500 ml of LR FLUID DEFICITS:  83ml of normal saline ESTIMATED BLOOD LOSS:  Less than 20 ml SPECIMENS: Endometrial curettings sent to pathology COMPLICATIONS:  None immediate.  PROCEDURE DETAILS:  The patient was taken to the operating room where general anesthesia was administered and was found to be adequate.  After an adequate timeout was performed, she was placed in the dorsal lithotomy position and examined; then prepped and draped in the sterile manner.   Her bladder was catheterized for an unmeasured amount of clear, yellow urine. A speculum was then placed in the patient's vagina and a single tooth tenaculum was applied to the anterior lip of the cervix.   A paracervical block using 10 ml of 0.5% Marcaine was administered.  The cervix was sounded to 7 cm and dilated manually with Hagar dilators to accommodate the 5 mm diagnostic hysteroscope.  Once the cervix was dilated, the hysteroscope was inserted under direct visualization using normal saline as a suspension medium.  The uterine cavity was carefully examined, both ostia were recognized, and diffusely  proliferative endometrium was noted.   After further careful visualization of the uterine cavity, the hysteroscope was removed under direct visualization.  A sharp curettage was then performed to obtain a moderate amount of endometrial curettings.  The tenaculum was removed from the anterior lip of the cervix and the vaginal speculum was removed after noting good hemostasis.  The patient tolerated the procedure well and was taken to the recovery area awake, extubated and in stable condition.

## 2018-08-01 NOTE — Transfer of Care (Signed)
Immediate Anesthesia Transfer of Care Note  Patient: Maziyah Vessel  Procedure(s) Performed: DILATATION AND CURETTAGE /HYSTEROSCOPY (N/A Vagina )  Patient Location: PACU  Anesthesia Type:General  Level of Consciousness: drowsy  Airway & Oxygen Therapy: Patient Spontanous Breathing and Patient connected to nasal cannula oxygen  Post-op Assessment: Report given to RN  Post vital signs: Reviewed and stable  Last Vitals: 125/83, 81, 14, 91% Vitals Value Taken Time  BP    Temp    Pulse 84 08/01/2018 11:59 AM  Resp 20 08/01/2018 11:59 AM  SpO2 89 % 08/01/2018 11:59 AM  Vitals shown include unvalidated device data.  Last Pain:  Vitals:   08/01/18 0950  TempSrc: Oral         Complications: No apparent anesthesia complications

## 2018-08-01 NOTE — Anesthesia Postprocedure Evaluation (Signed)
Anesthesia Post Note  Patient: Jarelly Rinck  Procedure(s) Performed: DILATATION AND CURETTAGE /HYSTEROSCOPY (N/A Vagina )     Patient location during evaluation: PACU Anesthesia Type: General Level of consciousness: awake and alert Pain management: pain level controlled Vital Signs Assessment: post-procedure vital signs reviewed and stable Respiratory status: spontaneous breathing, nonlabored ventilation, respiratory function stable and patient connected to nasal cannula oxygen Cardiovascular status: blood pressure returned to baseline and stable Postop Assessment: no apparent nausea or vomiting Anesthetic complications: no    Last Vitals:  Vitals:   08/01/18 1215 08/01/18 1230  BP: 116/79 111/69  Pulse: 78 77  Resp: 14 16  Temp:    SpO2: 90% 94%    Last Pain:  Vitals:   08/01/18 1300  TempSrc:   PainSc: 0-No pain                 Latora Quarry S

## 2018-08-01 NOTE — Anesthesia Procedure Notes (Signed)
Procedure Name: LMA Insertion Date/Time: 08/01/2018 11:15 AM Performed by: Bonney Aid, CRNA Pre-anesthesia Checklist: Patient identified, Emergency Drugs available, Suction available and Patient being monitored Patient Re-evaluated:Patient Re-evaluated prior to induction Oxygen Delivery Method: Circle system utilized Preoxygenation: Pre-oxygenation with 100% oxygen Induction Type: IV induction Ventilation: Mask ventilation without difficulty LMA: LMA inserted LMA Size: 4.0 Number of attempts: 1 Airway Equipment and Method: Bite block Placement Confirmation: positive ETCO2 Tube secured with: Tape Dental Injury: Teeth and Oropharynx as per pre-operative assessment

## 2018-08-02 ENCOUNTER — Encounter (HOSPITAL_BASED_OUTPATIENT_CLINIC_OR_DEPARTMENT_OTHER): Payer: Self-pay | Admitting: Obstetrics and Gynecology

## 2018-08-03 ENCOUNTER — Other Ambulatory Visit: Payer: Self-pay | Admitting: Obstetrics and Gynecology

## 2018-08-03 DIAGNOSIS — C541 Malignant neoplasm of endometrium: Secondary | ICD-10-CM

## 2018-08-07 ENCOUNTER — Ambulatory Visit: Payer: Self-pay | Attending: Family Medicine

## 2018-08-08 ENCOUNTER — Telehealth: Payer: Self-pay | Admitting: Obstetrics and Gynecology

## 2018-08-08 NOTE — Telephone Encounter (Signed)
Contacted patient by phone on 08/08/18 to inform patient of biopsy results and further management plan. Informed patient that she is scheduled to meet with Dr. Denman George on 08/30/18 for further evaluation and management of her endometrial cancer. Patient verbalized understanding and had limited questions at the time of the phone call. Advised patient to keep March 25 appointment with me and to write down any questions that may arise until then.

## 2018-08-10 ENCOUNTER — Other Ambulatory Visit: Payer: Self-pay | Admitting: Internal Medicine

## 2018-08-10 DIAGNOSIS — Z1231 Encounter for screening mammogram for malignant neoplasm of breast: Secondary | ICD-10-CM

## 2018-08-10 NOTE — Progress Notes (Signed)
Consult Note: Gyn-Onc  Consult was requested by Dr. Elly Modena for the evaluation of Kimberly Frazier 58 y.o. female  CC:  Chief Complaint  Patient presents with  . Endometrial carcinoma [facility])    Assessment/Plan:  Kimberly. Kimberly Frazier  is a 58 y.o.  year old with high grade endometrial cancer in the setting of morbid obesity (BMI >40), poorly controlled DM, CAD and a history of prior gyn cancer (cervical vs vaginal) treated with radiation.  A detailed discussion was held with the patient and her family with regard to to her endometrial cancer diagnosis. We discussed the standard management options for uterine cancer which includes surgery followed possibly by adjuvant therapy depending on the results of surgery. The options for surgical management include a hysterectomy and removal of the tubes and ovaries possibly with removal of pelvic and para-aortic lymph nodes.If feasible, a minimally invasive approach including a robotic hysterectomy or laparoscopic hysterectomy have benefits including shorter hospital stay, recovery time and better wound healing than with open surgery. The patient has been counseled about these surgical options and the risks of surgery in general including infection, bleeding, damage to surrounding structures (including bowel, bladder, ureters, nerves or vessels), and the postoperative risks of PE/ DVT, and lymphedema. I extensively reviewed the additional risks of robotic hysterectomy including possible need for conversion to open laparotomy.  I discussed positioning during surgery of trendelenberg and risks of minor facial swelling and care we take in preoperative positioning.  After counseling and consideration of her options, she desires to proceed with robotic assisted total hysterectomy with bilateral sapingo-oophorectomy and SLN biopsy.   She will be seen by anesthesia for preoperative clearance and discussion of postoperative pain management.  She was given the opportunity to  ask questions, which were answered to her satisfaction, and she is agreement with the above mentioned plan of care.  She will need cardiac clearance preoperatively for risk stratification and optimization. She has "chest heaviness" on exertion and likely needs a stress test before proceeding with general anesthesia.  It is reasonable for her to continue aspirin 13m perioperatively though this does increase risk for postop bleeding.  We will check HbA1c today and if >8% she will need to see her endocrinologist/diabetologist for optimization of her diabetes regimen.   Due to her high grade cancer she have metastatic evaluation with CT chest/abdo/pelvis.   HPI: Kimberly HGerilynn Mccullarsis a 58year old P2 who is seen in consultation at the request of Dr CElly Modenafor grade 3 endometrial cancer.  The patient reports a history of light vaginal spotting for approximately 1 year.  Since January 2020 she developed heavy vaginal bleeding and this prompted her to see Dr. CElly Modenawho performed a transvaginal ultrasound on June 20, 2018.  This revealed a uterus measuring 7.6 x 3.1 x 4.9 cm, there was a hypoechoic masslike area in the cervix measuring 1.6 x 1.4 x 1.9 cm.  The endometrium was thickened at 14 mm.  It contained an endometrial mass measuring 2.6 cm in greatest dimension.  She was taken to the operating room on August 01, 2018 with Dr. CElly Modena  She performed the hysteroscopy D&C.  Intraoperative findings were significant for an 8-week size uterus, diffuse proliferative endometrium, but no mention of an endometrial mass.  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  Of note the patient has a history of a Pap smear in January 2020 that was normal with no high risk HPV detected.  The patient's medical history is complex.  She has a remote history of a gynecologic malignancy in 1999.  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  She describes treatment with primary brachii therapy (she describes being in bed in the hospital for 5 days with radiation internally).  She is unsure if she received chemotherapy.  She did not receive surgery for this.  She received follow-up for approximately a year after treatment and then discontinued.  Her medical history is also significant for coronary artery disease.  The patient had what she describes as a light myocardial infarction in 2016.  This was managed with a coronary angiography, however no stent was placed.  She did not receive ongoing cardiology evaluation as the patient reports that she did not have health insurance.  She was initially treated with medical therapy.  Her only medical therapy at the time of this diagnosis with aspirin 81 mg daily.  Patient has a history of type 2 diabetes mellitus for which she takes insulin.  She reports poor blood glucose control.  She reports not particularly being compliant with her diet.  She has sequelae of diabetic neuropathy in hands and feet and chronic kidney disease.  She has hypertension and hypercholesterolemia.  The patient is morbidly obese with a BMI of 48 kg/m.  Her prior abdominal surgery includes 1 prior cesarean section and, as a child, a midline laparotomy for a hernia repair.  Her family history is significant for father who had a diagnosis of chins lymphoma, and a brother had a history of colon cancer.  Current Meds:  Outpatient Encounter Medications as of 08/11/2018  Medication Sig  . aspirin EC 81 MG tablet Take 1 tablet (81 mg total) daily by mouth.  Marland Kitchen atorvastatin (LIPITOR) 40 MG tablet TAKE 1 TABLET DAILY BY MOUTH. (Patient taking differently: Take 40 mg by mouth every morning. )  . BD PEN NEEDLE NANO U/F 32G X 4 MM MISC USE TWICE DAILY AS DIRECTED  . Blood Glucose Monitoring Suppl (TRUE METRIX METER) w/Device KIT Use as directed  . carvedilol (COREG) 25 MG tablet Take 1 tablet (25 mg  total) by mouth 2 (two) times daily. (Patient taking differently: Take 25 mg by mouth 2 (two) times daily. )  . Dulaglutide (TRULICITY) 4.09 WJ/1.9JY SOPN Inject 0.75 mg into the skin once a week. (Patient taking differently: Inject 0.75 mg into the skin once a week. Saturday's)  . gabapentin (NEURONTIN) 300 MG capsule Take 1 capsule (300 mg total) by mouth at bedtime. (Patient taking differently: Take 300 mg by mouth at bedtime. )  . glucose blood (TRUE METRIX BLOOD GLUCOSE TEST) test strip Use as instructed  . HUMALOG 100 UNIT/ML injection INJECT 28 UNITS INTO THE SKIN THREE TIMES DAILY BEFORE MEALS. (Patient taking differently: Inject 28 Units into the skin 3 (three) times daily with meals. )  . ibuprofen (ADVIL,MOTRIN) 600 MG tablet Take 1 tablet (600 mg total) by mouth every 6 (six) hours as needed.  . Insulin Glargine (LANTUS SOLOSTAR) 100 UNIT/ML Solostar Pen Inject 65 Units into the skin daily. (Patient taking differently: Inject 65 Units into the skin at bedtime. )  . Insulin Pen Needle (ULTICARE MICRO PEN NEEDLES) 32G X 4 MM MISC Use as directed with insulin  . nitroGLYCERIN (NITROSTAT) 0.4 MG SL tablet Place 1 tab under tongue for chest pain.  May repeat after 5 minutes x 2.  DO NOT TAKE MORE THAN 3 TABS DURING AN EPISODE OF CHEST PAIN  .  oxyCODONE-acetaminophen (PERCOCET/ROXICET) 5-325 MG tablet Take 1 tablet by mouth every 6 (six) hours as needed.  . potassium chloride (KLOR-CON) 8 MEQ tablet Take 2 tablets (16 mEq total) by mouth daily. (Patient taking differently: Take 8 mEq by mouth 2 (two) times daily. )  . TRUEPLUS LANCETS 28G MISC Use as directted   No facility-administered encounter medications on file as of 08/11/2018.     Allergy: No Known Allergies  Social Hx:   Social History   Socioeconomic History  . Marital status: Single    Spouse name: Not on file  . Number of children: 2  . Years of education: 32  . Highest education level: Not on file  Occupational History  .  Occupation: RETAIL    Employer: [employer]  Social Needs  . Financial resource strain: Not on file  . Food insecurity:    Worry: Often true    Inability: Sometimes true  . Transportation needs:    Medical: No    Non-medical: No  Tobacco Use  . Smoking status: Never Smoker  . Smokeless tobacco: Never Used  Substance and Sexual Activity  . Alcohol use: Not Currently  . Drug use: Not Currently    Types: Marijuana    Comment: 07-28-2018  per pt last smoked 12/ 2019  . Sexual activity: Yes  Lifestyle  . Physical activity:    Days per week: 0 days    Minutes per session: 0 min  . Stress: Only a little  Relationships  . Social connections:    Talks on phone: More than three times a week    Gets together: More than three times a week    Attends religious service: Never    Active member of club or organization: No    Attends meetings of clubs or organizations: Never    Relationship status: Living with partner  . Intimate partner violence:    Fear of current or ex partner: No    Emotionally abused: No    Physically abused: No    Forced sexual activity: No  Other Topics Concern  . Not on file  Social History Narrative   Regular exercise-no   No caffeine use    Past Surgical Hx:  Past Surgical History:  Procedure Laterality Date  . BREAST BIOPSY  2012   benign  . CARDIAC CATHETERIZATION N/A 02/06/2015   Procedure: Left Heart Cath and Coronary Angiography;  Surgeon: Larey Dresser, MD;  Location: [facility];  Service: Cardiovascular;  Laterality: N/A;  . HYSTEROSCOPY W/D&C N/A 08/01/2018   Procedure: DILATATION AND CURETTAGE /HYSTEROSCOPY;  Surgeon: Mora Bellman, MD;  Location: [facility];  Service: Gynecology;  Laterality: N/A;  . UMBILICAL HERNIA REPAIR  child    Past Medical Hx:  Past Medical History:  Diagnosis Date  . Adrenal adenoma, left   . Arthritis   . CKD (chronic kidney disease), stage II   . Coronary artery disease 07-28-2018 followed  [facility]  currently due to no insurance   per cardiac cath 02-06-2015 (positive mild lateral ishcemia on stress test)--- dLAD 80%,  mLAD 40%,  mPDA 80% (small vessel),  ostial D1 70%,  CFx with lumial irregarlities-- medical management  . Depression   . Diabetic neuropathy ([place])   . History of Bell's palsy 07/2011   per pt residual facial pain on left side occasionally  . History of cancer of vagina 1999   per pt completed radiation and chemo  . History of sepsis  12/30/2017   positive blood culter fro E.coli  . Hyperlipidemia   . Hypertension   . Hypokalemia   . Insulin dependent type 2 diabetes mellitus, uncontrolled ([place])    followed by pcp---  A1c was 11.7 on 06-15-2018 in epic  . Nocturia   . Peripheral neuropathy   . PMB (postmenopausal bleeding)   . Wears dentures    upper  . Wears glasses     Past Gynecological History:  See HPI No LMP recorded. Patient is postmenopausal.  Family Hx:  Family History  Problem Relation Age of Onset  . Heart disease Mother   . Hypertension Mother   . Diabetes Mother   . Cancer Father        hodgkins lymphoma  . Heart disease Sister        heart attack  . Diabetes Sister   . Cancer Other        parent  . Diabetes Other        parent  . Heart disease Other        parent  . Hyperlipidemia Other        parent  . Hypertension Other        parent  . Arthritis Other        parent  . Diabetes Maternal Aunt   . Diabetes Maternal Uncle   . Breast cancer Neg Hx     Review of Systems:  Constitutional  Feels well,    ENT Normal appearing ears and nares bilaterally Skin/Breast  No rash, sores, jaundice, itching, dryness Cardiovascular  No chest pain, shortness of breath, or edema  Pulmonary  No cough or wheeze.  Gastro Intestinal  No nausea, vomitting, or diarrhoea. No bright red blood per rectum, no abdominal pain, change in bowel movement, or constipation.  Genito Urinary  No frequency, urgency,  dysuria, + postmenopausal bleeding Musculo Skeletal  No myalgia, arthralgia, joint swelling or pain  Neurologic  No weakness, numbness, change in gait,  Psychology  No depression, anxiety, insomnia.   Vitals:  Blood pressure (!) 150/96, pulse 88, temperature 98.5 F (36.9 C), temperature source Oral, resp. rate 18, height 5' 4"  (1.626 m), weight 276 lb 8 oz (125.4 kg), SpO2 100 %.  Physical Exam: WD in NAD Neck  Supple NROM, without any enlargements.  Lymph Node Survey No cervical supraclavicular or inguinal adenopathy Cardiovascular  Pulse normal rate, regularity and rhythm. S1 and S2 normal.  Lungs  Clear to auscultation bilateraly, without wheezes/crackles/rhonchi. Good air movement.  Skin  No rash/lesions/breakdown  Psychiatry  Alert and oriented to person, place, and time  Abdomen  Normoactive bowel sounds, abdomen soft, non-tender and obese without evidence of hernia. Back No CVA tenderness Genito Urinary  Vulva/vagina: Normal external female genitalia.  No lesions. No discharge or bleeding.  Bladder/urethra:  No lesions or masses, well supported bladder  Vagina: normal  Cervix: Pale (consistent with prior radiation), no lesions.  Uterus: Small, mobile, no parametrial involvement or nodularity.  Adnexa: no palpable masses. Rectal  deferred Extremities  No bilateral cyanosis, clubbing or edema.   Thereasa Solo, MD  08/11/2018, 1:02 PM

## 2018-08-11 ENCOUNTER — Other Ambulatory Visit: Payer: Self-pay

## 2018-08-11 ENCOUNTER — Inpatient Hospital Stay: Payer: Medicaid Other | Attending: Gynecologic Oncology | Admitting: Gynecologic Oncology

## 2018-08-11 ENCOUNTER — Encounter: Payer: Self-pay | Admitting: Gynecologic Oncology

## 2018-08-11 ENCOUNTER — Inpatient Hospital Stay: Payer: Medicaid Other

## 2018-08-11 ENCOUNTER — Other Ambulatory Visit: Payer: Self-pay | Admitting: Gynecologic Oncology

## 2018-08-11 VITALS — BP 150/96 | HR 88 | Temp 98.5°F | Resp 18 | Ht 64.0 in | Wt 276.5 lb

## 2018-08-11 DIAGNOSIS — Z79899 Other long term (current) drug therapy: Secondary | ICD-10-CM | POA: Insufficient documentation

## 2018-08-11 DIAGNOSIS — Z794 Long term (current) use of insulin: Secondary | ICD-10-CM | POA: Diagnosis not present

## 2018-08-11 DIAGNOSIS — I129 Hypertensive chronic kidney disease with stage 1 through stage 4 chronic kidney disease, or unspecified chronic kidney disease: Secondary | ICD-10-CM | POA: Diagnosis not present

## 2018-08-11 DIAGNOSIS — I2510 Atherosclerotic heart disease of native coronary artery without angina pectoris: Secondary | ICD-10-CM | POA: Diagnosis not present

## 2018-08-11 DIAGNOSIS — E1165 Type 2 diabetes mellitus with hyperglycemia: Secondary | ICD-10-CM | POA: Insufficient documentation

## 2018-08-11 DIAGNOSIS — N189 Chronic kidney disease, unspecified: Secondary | ICD-10-CM | POA: Insufficient documentation

## 2018-08-11 DIAGNOSIS — E1122 Type 2 diabetes mellitus with diabetic chronic kidney disease: Secondary | ICD-10-CM | POA: Insufficient documentation

## 2018-08-11 DIAGNOSIS — Z6841 Body Mass Index (BMI) 40.0 and over, adult: Secondary | ICD-10-CM | POA: Insufficient documentation

## 2018-08-11 DIAGNOSIS — E785 Hyperlipidemia, unspecified: Secondary | ICD-10-CM | POA: Diagnosis not present

## 2018-08-11 DIAGNOSIS — C541 Malignant neoplasm of endometrium: Secondary | ICD-10-CM

## 2018-08-11 DIAGNOSIS — E7800 Pure hypercholesterolemia, unspecified: Secondary | ICD-10-CM | POA: Insufficient documentation

## 2018-08-11 LAB — BASIC METABOLIC PANEL
Anion gap: 10 (ref 5–15)
BUN: 17 mg/dL (ref 6–20)
CO2: 27 mmol/L (ref 22–32)
Calcium: 9.3 mg/dL (ref 8.9–10.3)
Chloride: 105 mmol/L (ref 98–111)
Creatinine, Ser: 1.06 mg/dL — ABNORMAL HIGH (ref 0.44–1.00)
GFR calc non Af Amer: 57 mL/min — ABNORMAL LOW (ref >60)
Glucose, Bld: 79 mg/dL (ref 70–99)
Potassium: 3.4 mmol/L — ABNORMAL LOW (ref 3.5–5.1)
Sodium: 142 mmol/L (ref 135–145)

## 2018-08-11 LAB — HEMOGLOBIN A1C
Hgb A1c MFr Bld: 10 % — ABNORMAL HIGH (ref 4.8–5.6)
Mean Plasma Glucose: 240.3 mg/dL

## 2018-08-11 MED ORDER — IBUPROFEN 600 MG PO TABS: 600.0000 mg | ORAL_TABLET | Freq: Four times a day (QID) | ORAL | 0 refills | Status: DC | PRN

## 2018-08-11 MED ORDER — TRAMADOL HCL 50 MG PO TABS: 50.0000 mg | ORAL_TABLET | Freq: Four times a day (QID) | ORAL | 0 refills | Status: DC | PRN

## 2018-08-11 MED ORDER — SENNOSIDES-DOCUSATE SODIUM 8.6-50 MG PO TABS: 2.0000 | ORAL_TABLET | Freq: Every day | ORAL | 1 refills | Status: DC

## 2018-08-11 MED FILL — traMADol HCL 50 MG TABS: 50 | 3 days supply | Qty: 10 | Fill #0

## 2018-08-11 NOTE — Patient Instructions (Signed)
We will check lab work today and contact you with the results.  Plan on having a CT scan of the chest, abdomen, and pelvis to evaluate for spread of the cancer and we will contact you with the results.  We will also arrange for you to meet with a cardiologist for cardiac clearance and for evaluation of the chest heaviness you are experiencing.   Preparing for your Surgery  Plan for surgery on September 12, 2018 with Dr. Everitt Amber at [facility] will be scheduled for a robotic assisted total hysterectomy, bilateral salpingo-oophorectomy, sentinel lymph node biopsy, possible lymphadenectomy.   Pre-operative Testing -You will receive a phone call from presurgical testing at [facility] if you have not received a call already to arrange for a pre-operative testing appointment before your surgery.  This appointment normally occurs one to two weeks before your scheduled surgery.   -Bring your insurance card, copy of an advanced directive if applicable, medication list  -At that visit, you will be asked to sign a consent for a possible blood transfusion in case a transfusion becomes necessary during surgery.  The need for a blood transfusion is rare but having consent is a necessary part of your care.     -YOU CAN CONTINUE TAKING YOUR ASPIRIN 81 MG.  -As part of our enhanced surgical recovery pathway, you may be advised to drink a carbohydrate drink the morning of surgery (at least 3 hours before). If you are diabetic, you will be given G2 gatorade to prevent a rise in your blood glucose levels.  Day Before Surgery at [place] will be asked to take in a light diet the day before surgery.  Avoid carbonated beverages.  You will be advised to have nothing to eat or drink after midnight the evening before.    Eat a light diet the day before surgery.  Examples including soups, broths, toast, yogurt, mashed potatoes.  Things to avoid include carbonated beverages (fizzy beverages), raw  fruits and raw vegetables, or beans.   If your bowels are filled with gas, your surgeon will have difficulty visualizing your pelvic organs which increases your surgical risks.  Your role in recovery Your role is to become active as soon as directed by your doctor, while still giving yourself time to heal.  Rest when you feel tired. You will be asked to do the following in order to speed your recovery:  - Cough and breathe deeply. This helps toclear and expand your lungs and can prevent pneumonia. You may be given a spirometer to practice deep breathing. A staff member will show you how to use the spirometer. - Do mild physical activity. Walking or moving your legs help your circulation and body functions return to normal. A staff member will help you when you try to walk and will provide you with simple exercises. Do not try to get up or walk alone the first time. - Actively manage your pain. Managing your pain lets you move in comfort. We will ask you to rate your pain on a scale of zero to 10. It is your responsibility to tell your doctor or nurse where and how much you hurt so your pain can be treated.  Special Considerations -If you are diabetic, you may be placed on insulin after surgery to have closer control over your blood sugars to promote healing and recovery.  This does not mean that you will be discharged on insulin.  If applicable, your oral antidiabetics will  be resumed when you are tolerating a solid diet.  -Your final pathology results from surgery should be available around one week after surgery and the results will be relayed to you when available.  -Dr. Lahoma Crocker is the Surgeon that assists your GYN Oncologist with surgery.  The next day after your surgery you will either see Dr. Denman George or Dr. Lahoma Crocker.  -FMLA forms can be faxed to [phone] and please allow 5-7 business days for completion.  Pain Management After Surgery -You have been prescribed your  pain medication and bowel regimen medications before surgery so that you can have these available when you are discharged from the hospital. The pain medication is for use ONLY AFTER surgery and a new prescription will not be given.   -Make sure that you have Tylenol and Ibuprofen at home to use on a regular basis after surgery for pain control. We recommend alternating the medications every hour to six hours since they work differently and are processed in the body differently for pain relief.  -Review the attached handout on narcotic use and their risks and side effects.   Bowel Regimen -You have been prescribed Sennakot-S to take nightly to prevent constipation especially if you are taking the narcotic pain medication intermittently.  It is important to prevent constipation and drink adequate amounts of liquids.  Blood Transfusion Information WHAT IS A BLOOD TRANSFUSION? A transfusion is the replacement of blood or some of its parts. Blood is made up of multiple cells which provide different functions.  Red blood cells carry oxygen and are used for blood loss replacement.  White blood cells fight against infection.  Platelets control bleeding.  Plasma helps clot blood.  Other blood products are available for specialized needs, such as hemophilia or other clotting disorders. BEFORE THE TRANSFUSION  Who gives blood for transfusions?   You may be able to donate blood to be used at a later date on yourself (autologous donation).  Relatives can be asked to donate blood. This is generally not any safer than if you have received blood from a stranger. The same precautions are taken to ensure safety when a relative's blood is donated.  Healthy volunteers who are fully evaluated to make sure their blood is safe. This is blood bank blood. Transfusion therapy is the safest it has ever been in the practice of medicine. Before blood is taken from a donor, a complete history is taken to make sure  that person has no history of diseases nor engages in risky social behavior (examples are intravenous drug use or sexual activity with multiple partners). The donor's travel history is screened to minimize risk of transmitting infections, such as malaria. The donated blood is tested for signs of infectious diseases, such as HIV and hepatitis. The blood is then tested to be sure it is compatible with you in order to minimize the chance of a transfusion reaction. If you or a relative donates blood, this is often done in anticipation of surgery and is not appropriate for emergency situations. It takes many days to process the donated blood. RISKS AND COMPLICATIONS Although transfusion therapy is very safe and saves many lives, the main dangers of transfusion include:   Getting an infectious disease.  Developing a transfusion reaction. This is an allergic reaction to something in the blood you were given. Every precaution is taken to prevent this. The decision to have a blood transfusion has been considered carefully by your caregiver before blood is given. Blood is  not given unless the benefits outweigh the risks.

## 2018-08-12 LAB — CA 125: CANCER ANTIGEN (CA) 125: 8.5 U/mL (ref 0.0–38.1)

## 2018-08-14 ENCOUNTER — Telehealth: Payer: Self-pay | Admitting: Oncology

## 2018-08-14 ENCOUNTER — Encounter: Payer: Self-pay | Admitting: Oncology

## 2018-08-14 ENCOUNTER — Telehealth: Payer: Self-pay | Admitting: Gynecologic Oncology

## 2018-08-14 DIAGNOSIS — C541 Malignant neoplasm of endometrium: Secondary | ICD-10-CM

## 2018-08-14 MED FILL — $TRULICITY 0.75 MG/0.5 ML P: 0.75 | 84 days supply | Qty: 6 | Fill #2

## 2018-08-14 MED FILL — ?ATORVASTATIN 40MG TABLET: 40 | 30 days supply | Qty: 30 | Fill #2

## 2018-08-14 NOTE — Telephone Encounter (Signed)
Left a message for patient with appointment to see Dr. Harrell Gave with Cardiology on 09/01/18 at 8:20 am.  Requested a return call.

## 2018-08-14 NOTE — Telephone Encounter (Signed)
Left message asking the patient to please call the office to discuss lab work.  Plan to ask patient if she has an endocrinologist to have her evaluated prior to surgery due to her hemoglobin A1C of 10.0.

## 2018-08-15 ENCOUNTER — Telehealth: Payer: Self-pay

## 2018-08-15 NOTE — Telephone Encounter (Signed)
Told Kimberly Frazier that her Hgb A1c was 10.0 on 08-11-18.  This is showing that her diabetes is not well controlled.  Dr. Denman George may need to postpone surgery as she may not do well with surgery and healing postoperatively. Dr. Neoma Laming manages her diabetes at the [facility]  She does not have an endocrinologist.  In reviewing Dr. Durenda Age office note on 06-15-18 the in office Hgb A1c was 11.7. Pt was to continue Lantus  65 units daily. Humalog 28 units tid with meals. Trulicity  1.88 mg  Injection once a week was added  For DM management. Referral to a dietitian. Nutritition management has reached out to the patient per referral note with no return call from the patient.  Spoke with Dr Durenda Age office.  Pt will be placed on the cancellation list for an appointment this week, otherwise, she will be scheduled to see Dr. Wynetta Emery  In early April. Left message for patient with this information and reminder of appointment with cardiology on 09-01-18 with Dr. Harrell Gave.

## 2018-08-16 ENCOUNTER — Telehealth: Payer: Self-pay

## 2018-08-16 NOTE — Telephone Encounter (Signed)
Called Kimberly Frazier again and confirmed apt with Dr. Harrell Gave for cardiac clearance on 09/01/18 at 8:20.  She verbalized agreement and understanding.

## 2018-08-16 NOTE — Telephone Encounter (Signed)
Outgoing call to patient per Joylene John NP- "surgery on hold until A1C is less than 9."  Pt voiced understanding.  Remind her that she has appt with Dr Wynetta Emery in May but their office has her on a cancellation list and prefer first available but otherwise early April.Voiced understanding. And Dr. Harrell Gave 09-01-18 appt for cardiology appt -cardiac clearance- pt said she is aware.  Encouraged pt to take any first available appts as A1C and important to watch for her diabetes, medication management, and in order to schedule surgery.  Pt voiced understanding - no other needs per pt at this time.

## 2018-08-17 ENCOUNTER — Other Ambulatory Visit: Payer: Self-pay

## 2018-08-17 ENCOUNTER — Ambulatory Visit (HOSPITAL_COMMUNITY)
Admission: RE | Admit: 2018-08-17 | Discharge: 2018-08-17 | Disposition: A | Discharge status: Home / Self Care | Payer: Medicaid Other | Source: Ambulatory Visit | Attending: Gynecologic Oncology | Admitting: Gynecologic Oncology

## 2018-08-17 DIAGNOSIS — C541 Malignant neoplasm of endometrium: Secondary | ICD-10-CM | POA: Insufficient documentation

## 2018-08-17 MED ORDER — SODIUM CHLORIDE (PF) 0.9 % IJ SOLN
INTRAMUSCULAR | Status: AC
  Filled 2018-08-17: qty 50

## 2018-08-17 MED ORDER — IOHEXOL 300 MG/ML  SOLN
100.0000 mL | Freq: Once | INTRAMUSCULAR | Status: AC | PRN
  Administered 2018-08-17: 100 mL via INTRAVENOUS

## 2018-08-18 ENCOUNTER — Telehealth: Payer: Self-pay | Admitting: Oncology

## 2018-08-18 NOTE — Telephone Encounter (Signed)
Left a message for Kimberly Frazier to call back for CT results.

## 2018-08-21 ENCOUNTER — Telehealth: Payer: Self-pay | Admitting: Oncology

## 2018-08-21 NOTE — Telephone Encounter (Signed)
Herbie Drape and reviewed the CT results from 08/17/18 per Joylene John, NP.  Advised her that her PCP, Dr. Wynetta Emery has been contacted about the lung results and to keep her cardiology appointment on 09/01/18 for the pericardial effusion.  She verbalized understanding and agreement.

## 2018-08-23 ENCOUNTER — Telehealth (INDEPENDENT_AMBULATORY_CARE_PROVIDER_SITE_OTHER): Payer: Self-pay | Admitting: Obstetrics and Gynecology

## 2018-08-23 DIAGNOSIS — Z9889 Other specified postprocedural states: Secondary | ICD-10-CM

## 2018-08-23 NOTE — Progress Notes (Signed)
TELEHEALTH VIRTUAL GYNECOLOGY VISIT ENCOUNTER NOTE  I connected with Kimberly Frazier on 08/23/18 at 10:15 AM EDT by telephone at home and verified that I am speaking with the correct person using two identifiers.   I discussed the limitations, risks, security and privacy concerns of performing an evaluation and management service by telephone and the availability of in person appointments. I also discussed with the patient that there may be a patient responsible charge related to this service. The patient expressed understanding and agreed to proceed.   History:  Kimberly Frazier is a 58 y.o. [id number] female being evaluated today for post op check. Patient underwent a dilatation and curretage on 08/01/18 for the evaluation of postmenopausal vaginal bleeding. Results of the pathology consistent with endometrial malignancy were previously shared. Patient has met with GYN ONC with plans for surgical intervention upon medical clearance. She denies any abnormal vaginal discharge, bleeding, pelvic pain or other concerns.       Past Medical History:  Diagnosis Date   Adrenal adenoma, left    Arthritis    CKD (chronic kidney disease), stage II    Coronary artery disease 07-28-2018 followed [facility])  currently due to no insurance   per cardiac cath 02-06-2015 (positive mild lateral ishcemia on stress test)--- dLAD 80%,  mLAD 40%,  mPDA 80% (small vessel),  ostial D1 70%,  CFx with lumial irregarlities-- medical management   Depression    Diabetic neuropathy ([place])    History of Bell's palsy 07/2011   per pt residual facial pain on left side occasionally   History of cancer of vagina 1999   per pt completed radiation and chemo   History of sepsis 12/30/2017   positive blood culter fro E.coli   Hyperlipidemia    Hypertension    Hypokalemia    Insulin dependent type 2 diabetes mellitus, uncontrolled ([place])    followed by pcp---  A1c was 11.7 on 06-15-2018 in epic    Nocturia    Peripheral neuropathy    PMB (postmenopausal bleeding)    Wears dentures    upper   Wears glasses    Past Surgical History:  Procedure Laterality Date   BREAST BIOPSY  2012   benign   CARDIAC CATHETERIZATION N/A 02/06/2015   Procedure: Left Heart Cath and Coronary Angiography;  Surgeon: Larey Dresser, MD;  Location: [facility];  Service: Cardiovascular;  Laterality: N/A;   HYSTEROSCOPY W/D&C N/A 08/01/2018   Procedure: DILATATION AND CURETTAGE /HYSTEROSCOPY;  Surgeon: Mora Bellman, MD;  Location: [facility];  Service: Gynecology;  Laterality: N/A;   UMBILICAL HERNIA REPAIR  child   The following portions of the patient's history were reviewed and updated as appropriate: allergies, current medications, past family history, past medical history, past social history, past surgical history and problem list.   Health Maintenance:  Normal pap and negative HRHPV on 06/15/18.  Normal mammogram on 06/16/2017.   Review of Systems:  Pertinent items noted in HPI and remainder of comprehensive ROS otherwise negative.  Physical Exam:  Physical exam deferred due to nature of the encounter  Labs and Imaging Results for orders placed or performed in visit on 08/11/18 (from the past 336 hour(s))  Basic metabolic panel   Collection Time: 08/11/18  2:01 PM  Result Value Ref Range   Sodium 142 135 - 145 mmol/L   Potassium 3.4 (L) 3.5 - 5.1 mmol/L   Chloride 105 98 - 111 mmol/L   CO2 27 22 -  32 mmol/L   Glucose, Bld 79 70 - 99 mg/dL   BUN 17 6 - 20 mg/dL   Creatinine, Ser 1.06 (H) 0.44 - 1.00 mg/dL   Calcium 9.3 8.9 - 10.3 mg/dL   GFR calc non Af Amer 57 (L) >60 mL/min   GFR calc Af Amer >60 >60 mL/min   Anion gap 10 5 - 15  CA 125   Collection Time: 08/11/18  2:01 PM  Result Value Ref Range   Cancer Antigen (CA) 125 8.5 0.0 - 38.1 U/mL  Hemoglobin A1c   Collection Time: 08/11/18  2:01 PM  Result Value Ref Range   Hgb A1c MFr Bld 10.0 (H) 4.8 -  5.6 %   Mean Plasma Glucose 240.3 mg/dL   Ct Chest W Contrast  Result Date: 08/17/2018 CLINICAL DATA:  New diagnosis endometrial cancer, evaluate for metastatic EXAM: CT CHEST, ABDOMEN, AND PELVIS WITH CONTRAST TECHNIQUE: Multidetector CT imaging of the chest, abdomen and pelvis was performed following the standard protocol during bolus administration of intravenous contrast. CONTRAST:  123m OMNIPAQUE IOHEXOL 300 MG/ML SOLN, additional oral enteric contrast COMPARISON:  MR abdomen, 01/18/2018 FINDINGS: CT CHEST FINDINGS Cardiovascular: No significant vascular findings. Normal heart size. Moderate pericardial effusion. Mediastinum/Nodes: No enlarged mediastinal, hilar, or axillary lymph nodes. Thyroid gland, trachea, and esophagus demonstrate no significant findings. Lungs/Pleura: There is mild bibasilar predominant and dependent subpleural ground-glass and irregular interstitial opacity (e.g. Series 4, image 70). No pleural effusion or pneumothorax. Musculoskeletal: No chest wall mass or suspicious bone lesions identified. CT ABDOMEN PELVIS FINDINGS Hepatobiliary: No focal liver abnormality is seen. No gallstones, gallbladder wall thickening, or biliary dilatation. Pancreas: Unremarkable. No pancreatic ductal dilatation or surrounding inflammatory changes. Spleen: Normal in size without focal abnormality. Small accessory splenule. Adrenals/Urinary Tract: There is a 2.0 cm nodule of the lateral limb of the left adrenal gland, previously characterized as a benign adenoma on MR kidneys are normal, without renal calculi, focal lesion, or hydronephrosis. Bladder is unremarkable. Stomach/Bowel: Stomach is within normal limits. Appendix appears normal. No evidence of bowel wall thickening, distention, or inflammatory changes. Vascular/Lymphatic: No significant vascular findings are present. No enlarged abdominal or pelvic lymph nodes. Reproductive: No mass or other abnormality. Other: No abdominal wall hernia or  abnormality. No abdominopelvic ascites. Musculoskeletal: No acute or significant osseous findings. IMPRESSION: 1. No evidence of metastatic disease in the chest, abdomen, or pelvis. No abdominal or pelvic lymphadenopathy. 2. Mild bibasilar predominant and dependent subpleural ground-glass and irregular interstitial opacities, which could reflect interstitial lung disease. Recommend follow-up ILD protocol chest CT in 6-12 months for further evaluation. 3. Moderate pericardial effusion, of uncertain etiology. Electronically Signed   By: AEddie CandleM.D.   On: 08/17/2018 16:51   Ct Abdomen Pelvis W Contrast  Result Date: 08/17/2018 CLINICAL DATA:  New diagnosis endometrial cancer, evaluate for metastatic EXAM: CT CHEST, ABDOMEN, AND PELVIS WITH CONTRAST TECHNIQUE: Multidetector CT imaging of the chest, abdomen and pelvis was performed following the standard protocol during bolus administration of intravenous contrast. CONTRAST:  1071mOMNIPAQUE IOHEXOL 300 MG/ML SOLN, additional oral enteric contrast COMPARISON:  MR abdomen, 01/18/2018 FINDINGS: CT CHEST FINDINGS Cardiovascular: No significant vascular findings. Normal heart size. Moderate pericardial effusion. Mediastinum/Nodes: No enlarged mediastinal, hilar, or axillary lymph nodes. Thyroid gland, trachea, and esophagus demonstrate no significant findings. Lungs/Pleura: There is mild bibasilar predominant and dependent subpleural ground-glass and irregular interstitial opacity (e.g. Series 4, image 70). No pleural effusion or pneumothorax. Musculoskeletal: No chest wall mass or suspicious bone lesions identified.  CT ABDOMEN PELVIS FINDINGS Hepatobiliary: No focal liver abnormality is seen. No gallstones, gallbladder wall thickening, or biliary dilatation. Pancreas: Unremarkable. No pancreatic ductal dilatation or surrounding inflammatory changes. Spleen: Normal in size without focal abnormality. Small accessory splenule. Adrenals/Urinary Tract: There is a 2.0  cm nodule of the lateral limb of the left adrenal gland, previously characterized as a benign adenoma on MR kidneys are normal, without renal calculi, focal lesion, or hydronephrosis. Bladder is unremarkable. Stomach/Bowel: Stomach is within normal limits. Appendix appears normal. No evidence of bowel wall thickening, distention, or inflammatory changes. Vascular/Lymphatic: No significant vascular findings are present. No enlarged abdominal or pelvic lymph nodes. Reproductive: No mass or other abnormality. Other: No abdominal wall hernia or abnormality. No abdominopelvic ascites. Musculoskeletal: No acute or significant osseous findings. IMPRESSION: 1. No evidence of metastatic disease in the chest, abdomen, or pelvis. No abdominal or pelvic lymphadenopathy. 2. Mild bibasilar predominant and dependent subpleural ground-glass and irregular interstitial opacities, which could reflect interstitial lung disease. Recommend follow-up ILD protocol chest CT in 6-12 months for further evaluation. 3. Moderate pericardial effusion, of uncertain etiology. Electronically Signed   By: Eddie Candle M.D.   On: 08/17/2018 16:51      Assessment and Plan:     58 yo here for post op check s/p D&C for postmenopausal vaginal bleeding.   Patient will be scheduled for hysterectomy in April   I discussed the assessment and treatment plan with the patient. The patient was provided an opportunity to ask questions and all were answered. The patient agreed with the plan and demonstrated an understanding of the instructions.   The patient was advised to call back or seek an in-person evaluation/go to the ED if the symptoms worsen or if the condition fails to improve as anticipated.  I provided 15 minutes of non-face-to-face time during this encounter.   Mora Bellman, MD [facility]

## 2018-08-25 ENCOUNTER — Encounter: Payer: Self-pay | Admitting: *Deleted

## 2018-08-30 ENCOUNTER — Ambulatory Visit: Payer: Self-pay | Admitting: Gynecologic Oncology

## 2018-08-30 ENCOUNTER — Telehealth: Payer: Self-pay | Admitting: Cardiology

## 2018-08-30 NOTE — Telephone Encounter (Signed)
Left message for pt to call back to do visit pre-call and set her up for virtual visit for Dr. Harrell Gave.

## 2018-08-31 ENCOUNTER — Telehealth: Payer: Self-pay

## 2018-08-31 NOTE — Telephone Encounter (Signed)
Lm w/ Venita Sheffield for pt to call back

## 2018-08-31 NOTE — Telephone Encounter (Signed)
Virtual Visit Pre-Appointment Phone Call  Kimberly Frazier has been deemed a candidate for a follow-up tele-health visit to limit community exposure during the Covid-19 pandemic. I spoke with the patient via phone to ensure availability of phone/video source, confirm preferred email & phone number, and discuss instructions and expectations.  I reminded Kimberly Frazier to be prepared with any vital sign and/or heart rhythm information that could potentially be obtained via home monitoring, at the time of her visit. I reminded Kimberly Frazier to expect a phone call at the time of her visit if her visit.  Did the patient verbally acknowledge consent to treatment? Patient provided verbal consent.  Donivan Scull, [place] 08/31/2018 3:53 PM   DOWNLOADING THE [place]  - If Apple, go to [employer] and type in WebEx in the search bar. [place] [employer], the blue/green circle. The app is free but as with any other app downloads, their phone may require them to verify saved payment information or Apple password. The patient does NOT have to create an account.  - If Android, ask patient to go to [employer] and type in WebEx in the search bar. [place] [employer], the blue/green circle. The app is free but as with any other app downloads, their phone may require them to verify saved payment information or Android password. The patient does NOT have to create an account.   CONSENT FOR TELE-HEALTH VISIT - PLEASE REVIEW  I hereby voluntarily request, consent and authorize [facility] and its employed or contracted physicians, physician assistants, nurse practitioners or other licensed health care professionals (the Practitioner), to provide me with telemedicine health care services (the "Services") as deemed necessary by the treating Practitioner. I acknowledge and consent to receive the Services by the Practitioner via telemedicine. I understand that the telemedicine  visit will involve communicating with the Practitioner through live audiovisual communication technology and the disclosure of certain medical information by electronic transmission. I acknowledge that I have been given the opportunity to request an in-person assessment or other available alternative prior to the telemedicine visit and am voluntarily participating in the telemedicine visit.  I understand that I have the right to withhold or withdraw my consent to the use of telemedicine in the course of my care at any time, without affecting my right to future care or treatment, and that the Practitioner or I may terminate the telemedicine visit at any time. I understand that I have the right to inspect all information obtained and/or recorded in the course of the telemedicine visit and may receive copies of available information for a reasonable fee.  I understand that some of the potential risks of receiving the Services via telemedicine include:  Marland Kitchen Delay or interruption in medical evaluation due to technological equipment failure or disruption; . Information transmitted may not be sufficient (e.g. poor resolution of images) to allow for appropriate medical decision making by the Practitioner; and/or  . In rare instances, security protocols could fail, causing a breach of personal health information.  Furthermore, I acknowledge that it is my responsibility to provide information about my medical history, conditions and care that is complete and accurate to the best of my ability. I acknowledge that Practitioner's advice, recommendations, and/or decision may be based on factors not within their control, such as incomplete or inaccurate data provided by me or distortions of diagnostic images or specimens that may result from electronic transmissions. I understand that the practice of medicine is  not an Chief Strategy Officer and that Practitioner makes no warranties or guarantees regarding treatment outcomes. I  acknowledge that I will receive a copy of this consent concurrently upon execution via email to the email address I last provided but may also request a printed copy by calling the office of [place].    I understand that my insurance will be billed for this visit.   I have read or had this consent read to me. . I understand the contents of this consent, which adequately explains the benefits and risks of the Services being provided via telemedicine.  . I have been provided ample opportunity to ask questions regarding this consent and the Services and have had my questions answered to my satisfaction. . I give my informed consent for the services to be provided through the use of telemedicine in my medical care  By participating in this telemedicine visit I agree to the above.

## 2018-08-31 NOTE — Telephone Encounter (Signed)
New Message   Patient returning [place] phone call.

## 2018-09-01 ENCOUNTER — Telehealth (INDEPENDENT_AMBULATORY_CARE_PROVIDER_SITE_OTHER): Payer: Self-pay | Admitting: Cardiology

## 2018-09-01 VITALS — Ht 64.0 in | Wt 279.0 lb

## 2018-09-01 DIAGNOSIS — I25118 Atherosclerotic heart disease of native coronary artery with other forms of angina pectoris: Secondary | ICD-10-CM

## 2018-09-01 DIAGNOSIS — Z01810 Encounter for preprocedural cardiovascular examination: Secondary | ICD-10-CM

## 2018-09-01 DIAGNOSIS — I313 Pericardial effusion (noninflammatory): Secondary | ICD-10-CM

## 2018-09-01 DIAGNOSIS — I3139 Other pericardial effusion (noninflammatory): Secondary | ICD-10-CM

## 2018-09-01 MED ORDER — ISOSORBIDE MONONITRATE ER 30 MG PO TB24: 30.0000 mg | ORAL_TABLET | Freq: Every day | ORAL | 3 refills | Status: DC

## 2018-09-01 NOTE — Patient Instructions (Signed)
Medication Instructions:  Start: Imdur 30 mg daily    If you need a refill on your cardiac medications before your next appointment, please call your pharmacy.   Lab work: None   Testing/Procedures: Your physician has requested that you have an echocardiogram. Echocardiography is a painless test that uses sound waves to create images of your heart. It provides your doctor with information about the size and shape of your heart and how well your heart's chambers and valves are working. This procedure takes approximately one hour. There are no restrictions for this procedure. [place] has requested that you have a lexiscan myoview. For further information please visit [email or web address]. Please follow instruction sheet, as given. [place] [address]   Follow-Up: Your physician recommends that you schedule a virtual follow-up appointment in 1 week with Dr. Harrell Gave.

## 2018-09-01 NOTE — Progress Notes (Signed)
Virtual Visit via Telephone Note    Evaluation Performed:  Preoperative evaluation  This visit type was conducted due to national recommendations for restrictions regarding the COVID-19 Pandemic (e.g. social distancing).  This format is felt to be most appropriate for this patient at this time.  All issues noted in this document were discussed and addressed.  No physical exam was performed (except for noted visual exam findings with Video Visits).  Please refer to the patient's chart (MyChart message for video visits and phone note for telephone visits) for the patient's consent to telehealth for [facility].  Date:  09/01/2018   ID:  Kimberly Frazier, DOB December 01, 1960, MRN [id number]  Patient Location:  [place], [place] [address]  Provider location:   Remote access with base in [place], [place] Office   PCP:  Ladell Pier, MD  Cardiologist:  Buford Dresser, MD PhD (new)  Chief Complaint:  Preoperative evaluation  History of Present Illness:    Kimberly Frazier is a 58 y.o. female who presents via audio/video conferencing for a telehealth visit today.   The patient does not have symptoms concerning for COVID-19 infection (fever, chills, cough, or new shortness of breath).   Preoperative cardiovascular evaluation  Planned surgery: hysterectomy for endometrial cancer, planned for September 12, 2018 per patient  Pertinent past cardiac history: CAD  Prior cardiac workup: lexiscan, cath in 2016 History of valve disease: none History of heart failure: none History of arrhythmia: none On anticoagulation: no Additional history: diabetes/on insulin, last A1c 10.0 Current symptoms: none at rest, but angina with ambulation Functional capacity: can't do steps at all, tired after walking a few feet, feels like a "log" in her chest with activity. This has been stable for her for at least a few months. No rest symptoms. Not accelerating.  Denies  shortness of breath at rest. No PND, orthopnea, LE edema or unexpected weight gain. No syncope or palpitations.  We discussed what might be her anginal equivalent for some time. We reviewed her prior lexiscan and cath together. We discussed how we evaluate her symptoms and what it might suggest. This is also reviewed in detail in the plan.  We also reviewed the incidental finding on her CT chest of a moderate pericardial effusion. She has been hypertensive, not low BP, and no syncope. No infectious symptoms. No issues with bleeding.   Prior CV studies:   The following studies were reviewed today: Cath 2016: Short, no significant disease.  Left Anterior Descending  40% mid LAD stenosis, 80% distal LAD stenosis. Small-moderate D1 with 70% ostial stenosis, 50% mid vessel stenosis.  Left Circumflex  Large system with luminal irregularities.  Right Coronary Artery  80% mid PDA stenosis. The PDA is a relatively small vessel.   The patient has 80% distal LAD and 80% mid PDA stenoses.  The PDA is a relatively small vessel.  As Cardiolite was not high risk, I would plan initial medical management here.  I think PCI would be technically possible but given the distal location in the LAD and the relatively small caliber of the PDA, I think medical management initially is the best plan.   The RCA was difficult to engage, located anteriorly, multiple catheters attempted, able to engage with EZ rad right catheter.  Will hydrate aggressively post-procedure.   Lexiscan 2016  Nuclear stress EF: 60%.  There was no ST segment deviation noted during stress.  The left ventricular ejection fraction is normal (55-65%).  Low risk stress nuclear study with a small, moderate intensity, reversible lateral defect consistent with mild lateral ischemia; EF 60 with normal wall motion.  Past Medical History:  Diagnosis Date  . Adrenal adenoma, left   . Arthritis   . CKD (chronic kidney disease), stage II   .  Coronary artery disease 07-28-2018 followed by pcp([facility])  currently due to no insurance   per cardiac cath 02-06-2015 (positive mild lateral ishcemia on stress test)--- dLAD 80%,  mLAD 40%,  mPDA 80% (small vessel),  ostial D1 70%,  CFx with lumial irregarlities-- medical management  . Depression   . Diabetic neuropathy ([place])   . History of Bell's palsy 07/2011   per pt residual facial pain on left side occasionally  . History of cancer of vagina 1999   per pt completed radiation and chemo  . History of sepsis 12/30/2017   positive blood culter fro E.coli  . Hyperlipidemia   . Hypertension   . Hypokalemia   . Insulin dependent type 2 diabetes mellitus, uncontrolled ([place])    followed by pcp---  A1c was 11.7 on 06-15-2018 in epic  . Nocturia   . Peripheral neuropathy   . PMB (postmenopausal bleeding)   . Wears dentures    upper  . Wears glasses    Past Surgical History:  Procedure Laterality Date  . BREAST BIOPSY  2012   benign  . CARDIAC CATHETERIZATION N/A 02/06/2015   Procedure: Left Heart Cath and Coronary Angiography;  Surgeon: Larey Dresser, MD;  Location: [facility];  Service: Cardiovascular;  Laterality: N/A;  . HYSTEROSCOPY W/D&C N/A 08/01/2018   Procedure: DILATATION AND CURETTAGE /HYSTEROSCOPY;  Surgeon: Mora Bellman, MD;  Location: [facility];  Service: Gynecology;  Laterality: N/A;  . UMBILICAL HERNIA REPAIR  child     No outpatient medications have been marked as taking for the 09/01/18 encounter (Appointment) with Buford Dresser, MD.     Allergies:   Patient has no known allergies.   Social History   Tobacco Use  . Smoking status: Never Smoker  . Smokeless tobacco: Never Used  Substance Use Topics  . Alcohol use: Not Currently  . Drug use: Not Currently    Types: Marijuana    Comment: 07-28-2018  per pt last smoked 12/ 2019     Family Hx: The patient's family history includes Arthritis in an other family  member; Cancer in her father and another family member; Diabetes in her maternal aunt, maternal uncle, mother, sister, and another family member; Heart disease in her mother, sister, and another family member; Hyperlipidemia in an other family member; Hypertension in her mother and another family member. There is no history of Breast cancer.  ROS:   Please see the history of present illness.    All other systems reviewed and are negative.   Labs/Other Tests and Data Reviewed:    Recent Labs: 06/15/2018: ALT 14; Hemoglobin 13.4; Platelets 229 08/11/2018: BUN 17; Creatinine, Ser 1.06; Potassium 3.4; Sodium 142   Recent Lipid Panel Lab Results  Component Value Date/Time   CHOL 234 (H) 04/18/2017 03:01 PM   TRIG 225 (H) 04/18/2017 03:01 PM   HDL 42 04/18/2017 03:01 PM   CHOLHDL 5.6 (H) 04/18/2017 03:01 PM   CHOLHDL 5 07/15/2015 03:13 PM   LDLCALC 147 (H) 04/18/2017 03:01 PM   LDLDIRECT 93.0 07/15/2015 03:13 PM    Wt Readings from Last 3 Encounters:  08/11/18 276 lb 8 oz (125.4 kg)  08/01/18  279 lb 4.8 oz (126.7 kg)  07/19/18 275 lb 6.4 oz (124.9 kg)     Objective:    Vital Signs:  Ht 5\' 4"  (1.626 m)   Wt 279 lb (126.6 kg)   BMI 47.89 kg/m   ASSESSMENT & PLAN:    1.  Preoperative cardiovascular risk assessment -anginal symptoms with minimal exertion. RCRI score of 2, which is equivalent to 10% 30 day risk of perioperative MI. She cannot achieve 4 METs of exercise.  This is a complicated situation. She has known nonrevascularized distal CAD, not ideal for stenting. She was supposed to be on medical management for this, but she has not been seen by cardiology in years, and she was no longer on some of her CV meds (such as imdur). Her lipids are not at goal (though outdated, may be improved), and her A1c is elevated. She also has past history noted of renal insufficiency, though recently this has been improved.  Together this puts her at least moderately at risk for surgery.  Unfortunately, her surgery is scheduled in 11 days, and we are in the midst of the coronavirus crisis.   Given her pericardial effusion on CT, I would pursue an echo. She does not have evidence of metastatic disease, but effusion in the setting of malignancy is concerning. It would not need to be drained, but it would to further evaluate so that anesthesia can prepare. No clinical suggestion of tamponade, and she has actually been hypertensive per her report.  With her angina, she needs additional testing. She cannot exercise, and with her history of renal insufficiency, I would prefer to avoid contrast if we can. I will order a lexiscan. If the ischemia is similar to prior, we know that she has distal disease, and I would not cath her. However, if the extent of ischemia has significantly progressed, then we will need to discuss next steps. While cath is typically preferred, with her upcoming cancer surgery, being on DAPT will be problematic. With her diabetes, if she has multivessel CAD, she would be recommended for CABG. Again however, with known endometrial cancer, we would not want to delay her cancer surgery.  Overall the risk/balance is difficult without further information. We will start with the echo and lexiscan. I have requested these to be done ASAP, but again in the setting of the coronavirus crisis, it is difficult for [place] to expedite these tests.   I will follow up with her in one week by phone to hopefully discuss test results and next steps.  She should not be taken off her aspirin, statin, or beta blocker perioperatively.  2. Known CAD -risk factors: type II diabetes on insulin, poor control (A1c 10.0), hyperlipidemia -on aspirin 81 mg, atorvastatin 40 mg, dulaglutide -last lipids checked in 2018, elevated above LDL goal of <70. Unclear when atorvastatin was started. Needs updated lipids, but this can wait until after surgery. She is on high intensity statin for her hyperlipidemia.  -she has ibuprofen listed on med listed. NSAIDs contraindicated in CAD -already on carvedilol 25 mg BID for HTN, has anti-anginal effect -will start imdur 30 mg daily for medical management of CAD -with diabetes and CAD, would consider ACEI/ARB if additional control needed, despite microalbumin of 177. However, noted to have renal insufficiency in the past, so will defer this to PCP  COVID-19 Education: The signs and symptoms of COVID-19 were discussed with the patient and how to seek care for testing (follow up with PCP or arrange E-visit).  The importance of social distancing was discussed today.  Patient Risk:   After full review of this patient's clinical status, I feel that they are at least moderate risk at this time.  Time:   Today, I have spent 47 minutes with the patient with telehealth technology discussing preoperative evaluation.    She did not have the available technology needed to pursue video conferencing. This is a highly complex case for medical decision making, and additional testing is needed ASAP for additional risk stratification and management.  Patient Instructions  Medication Instructions:  Start: Imdur 30 mg daily    If you need a refill on your cardiac medications before your next appointment, please call your pharmacy.   Lab work: None   Testing/Procedures: Your physician has requested that you have an echocardiogram. Echocardiography is a painless test that uses sound waves to create images of your heart. It provides your doctor with information about the size and shape of your heart and how well your heart's chambers and valves are working. This procedure takes approximately one hour. There are no restrictions for this procedure. [place] has requested that you have a lexiscan myoview. For further information please visit [email or web address]. Please follow instruction sheet, as given. [place] [address]    Follow-Up: Your physician recommends that you schedule a virtual follow-up appointment in 1 week with Dr. Harrell Gave.      Medication Adjustments/Labs and Tests Ordered: Current medicines are reviewed at length with the patient today.  Concerns regarding medicines are outlined above.  Tests Ordered: Orders Placed This Encounter  Procedures  . MYOCARDIAL PERFUSION IMAGING  . ECHOCARDIOGRAM COMPLETE   Medication Changes: Meds ordered this encounter  Medications  . isosorbide mononitrate (IMDUR) 30 MG 24 hr tablet    Sig: Take 1 tablet (30 mg total) by mouth daily.    Dispense:  90 tablet    Refill:  3    Disposition:  Follow up in 1 week(s)  Signed, Buford Dresser, MD  09/01/2018 8:10 AM    [facility]

## 2018-09-02 ENCOUNTER — Encounter: Payer: Self-pay | Admitting: Cardiology

## 2018-09-04 ENCOUNTER — Telehealth: Payer: Self-pay | Admitting: Oncology

## 2018-09-04 ENCOUNTER — Other Ambulatory Visit: Payer: Self-pay | Admitting: Internal Medicine

## 2018-09-04 DIAGNOSIS — I2510 Atherosclerotic heart disease of native coronary artery without angina pectoris: Secondary | ICD-10-CM

## 2018-09-04 DIAGNOSIS — I10 Essential (primary) hypertension: Secondary | ICD-10-CM

## 2018-09-04 MED FILL — POTASSIUM CL ER 8 MEQ CAPSU: 8 | 30 days supply | Qty: 60 | Fill #3

## 2018-09-04 MED FILL — ?CARVEDILOL 25 MG TABLET: 25 | 30 days supply | Qty: 60 | Fill #0

## 2018-09-04 MED FILL — ?ATORVASTATIN 40MG TABLET: 40 | 30 days supply | Qty: 30 | Fill #0

## 2018-09-04 NOTE — Telephone Encounter (Signed)
Called Kimberly Frazier and spoke to her daughter, [name].  Advised her that surgery has been canceled due to Covid 19 and that we will contact her when it is able to be rescheduled.  Discussed that she will need to have a stress test before surgery and should see her PCP to help bring down her HGB A1C.  She will need to follow up with Dr. Denman George before surgery is rescheduled and have her HGB A1C drawn.  She verbalized agreement and understanding.

## 2018-09-05 ENCOUNTER — Other Ambulatory Visit: Payer: Self-pay | Admitting: Pharmacist

## 2018-09-05 DIAGNOSIS — IMO0002 Reserved for concepts with insufficient information to code with codable children: Secondary | ICD-10-CM

## 2018-09-05 DIAGNOSIS — E1142 Type 2 diabetes mellitus with diabetic polyneuropathy: Secondary | ICD-10-CM

## 2018-09-05 DIAGNOSIS — E1165 Type 2 diabetes mellitus with hyperglycemia: Principal | ICD-10-CM

## 2018-09-05 MED ORDER — INSULIN LISPRO 100 UNIT/ML ~~LOC~~ SOLN: SUBCUTANEOUS | 2 refills | Status: DC

## 2018-09-05 MED ORDER — INSULIN GLARGINE 100 UNIT/ML SOLOSTAR PEN: 65.0000 [IU] | PEN_INJECTOR | Freq: Every day | SUBCUTANEOUS | 2 refills | Status: DC

## 2018-09-05 MED FILL — $LANTUS SOLOSTAR 100 UNITS/: 100 | 69 days supply | Qty: 45 | Fill #0

## 2018-09-05 MED FILL — $Humalog 100u/ml vial: 100 | 71 days supply | Qty: 60 | Fill #0

## 2018-09-06 ENCOUNTER — Ambulatory Visit (HOSPITAL_COMMUNITY): Payer: Medicaid Other | Attending: Cardiology

## 2018-09-06 ENCOUNTER — Encounter (HOSPITAL_COMMUNITY): Payer: Self-pay

## 2018-09-06 ENCOUNTER — Telehealth: Payer: Self-pay | Admitting: Cardiology

## 2018-09-06 ENCOUNTER — Other Ambulatory Visit: Payer: Self-pay

## 2018-09-06 ENCOUNTER — Ambulatory Visit (HOSPITAL_COMMUNITY): Payer: Medicaid Other

## 2018-09-06 VITALS — BP 169/107

## 2018-09-06 DIAGNOSIS — I25118 Atherosclerotic heart disease of native coronary artery with other forms of angina pectoris: Secondary | ICD-10-CM | POA: Diagnosis not present

## 2018-09-06 DIAGNOSIS — I3139 Other pericardial effusion (noninflammatory): Secondary | ICD-10-CM

## 2018-09-06 DIAGNOSIS — R072 Precordial pain: Secondary | ICD-10-CM

## 2018-09-06 DIAGNOSIS — I313 Pericardial effusion (noninflammatory): Secondary | ICD-10-CM | POA: Insufficient documentation

## 2018-09-06 MED ORDER — PERFLUTREN LIPID MICROSPHERE
1.0000 mL | INTRAVENOUS | Status: AC | PRN
  Administered 2018-09-06: 2 mL via INTRAVENOUS

## 2018-09-06 NOTE — Telephone Encounter (Signed)
Called patient to discuss the results of her echo. Echo showed large pericardial effusion with adherent material seen near RV apex. Mildly dilated ascending aorta. Apical wall motion abnormalities with overall preserved EF.  After discussion with Drs. Meda Coffee and Margaretann Loveless, plan is for cardiac CT with expanded field to evaluate coronaries, effusion, and aorta.  Discussed this with patient, and she is amenable to this plan. Discussed things that cause effusion and what we are looking for on her testing. She recently had a general CT and tolerated this well. She is amenable to another cardiac CT.  She is already on beta blocker and recent Cr is 1.06. Will plan to pursue cardiac CT in the next two days.  Buford Dresser, MD, PhD [facility]  [address], [place],  [address] [phone]

## 2018-09-06 NOTE — Addendum Note (Signed)
Addended by: Meryl Crutch on: 09/06/2018 05:05 PM   Modules accepted: Orders

## 2018-09-06 NOTE — Telephone Encounter (Signed)
Order placed

## 2018-09-12 ENCOUNTER — Telehealth (HOSPITAL_COMMUNITY): Payer: Self-pay | Admitting: Emergency Medicine

## 2018-09-12 ENCOUNTER — Ambulatory Visit: Admit: 2018-09-12 | Payer: Self-pay | Admitting: Gynecologic Oncology

## 2018-09-12 ENCOUNTER — Telehealth: Payer: Self-pay | Admitting: *Deleted

## 2018-09-12 SURGERY — HYSTERECTOMY, TOTAL, ROBOT-ASSISTED, LAPAROSCOPIC, WITH BILATERAL SALPINGO-OOPHORECTOMY
Anesthesia: General

## 2018-09-12 NOTE — Telephone Encounter (Signed)
-----   Message from Armando Gang sent at 09/12/2018  8:41 AM EDT ----- Regarding: RE: CT for cors and ascending aorta/look at pericardium Patient as been schedule fro 09/12/08 @ 9:30   ----- Message ----- From: Augustine Radar Sent: 09/11/2018  12:57 PM EDT To: Dorothy Spark, MD, Lorenza Evangelist, RN, # Subject: RE: CT for cors and ascending aorta/look at #  Looks like she was supposed to have a hysterectomy today, but was cancelled.   ----- Message ----- From: Dorothy Spark, MD Sent: 09/11/2018  12:13 PM EDT To: April H Pait, Lorenza Evangelist, RN, # Subject: FW: CT for cors and ascending aorta/look at #  I dont see her on CT schadule, she is an urgent coronary CTA, can you please arrange? Thank you! [place] Siren ----- Message ----- From: Buford Dresser, MD Sent: 09/06/2018   4:06 PM EDT To: Dorothy Spark, MD Subject: CT for cors and ascending aorta/look at peri#  Thanks so much for the help! This is the patient with the effusion, distal/apical WMA (known distal LAD and PDA disease from prior cath), and slightly enlarged ascending aorta. I'm going to call her now and given her the results of the echo. I'll put the order in for her CT and tell her to expect a call to schedule. Thanks! [place]

## 2018-09-12 NOTE — Telephone Encounter (Signed)
Reaching out to patient to offer assistance regarding upcoming cardiac imaging study; pt verbalizes understanding of appt date/time, parking situation and where to check in, pre-test NPO status, and verified current allergies; name and call back number provided for further questions should they arise Marchia Bond RN Navigator Cardiac Imaging [facility] [phone] office [phone] cell  Pt denies any covid-19 symptoms including fever, cough, shortness of breath  Pt states she will be attending her appt alone.

## 2018-09-12 NOTE — Telephone Encounter (Signed)
Left message on voicemail with name and callback number Jalyn Rosero RN Navigator Cardiac Imaging [facility] [phone] Office [phone] Cell  

## 2018-09-13 ENCOUNTER — Ambulatory Visit (HOSPITAL_COMMUNITY)
Admission: RE | Admit: 2018-09-13 | Discharge: 2018-09-13 | Disposition: A | Discharge status: Home / Self Care | Payer: Medicaid Other | Source: Ambulatory Visit | Attending: Cardiology | Admitting: Cardiology

## 2018-09-13 ENCOUNTER — Encounter (HOSPITAL_COMMUNITY): Payer: Self-pay

## 2018-09-13 ENCOUNTER — Other Ambulatory Visit: Payer: Self-pay

## 2018-09-13 DIAGNOSIS — I2510 Atherosclerotic heart disease of native coronary artery without angina pectoris: Secondary | ICD-10-CM

## 2018-09-13 DIAGNOSIS — R072 Precordial pain: Secondary | ICD-10-CM | POA: Insufficient documentation

## 2018-09-13 IMAGING — CT CT HEAR MORPH WITH CTA COR WITH SCORE WITH CA WITH CONTRAST AND
1 of 8 series · 5 of 20 positions shown, 7 images · IV contrast (APPLIED)
Comparison: None.
COMPARISON: None.

Addendum:
EXAM:
OVER-READ INTERPRETATION  CT CHEST

The following report is an over-read performed by radiologist Dr.
Fys Hajee [REDACTED] on 09/13/2018. This
over-read does not include interpretation of cardiac or coronary
anatomy or pathology. The coronary calcium score/coronary CTA
interpretation by the cardiologist is attached.
CLINICAL DATA: 58-year-old female with known CAD (80% distal LAD
and 80% mid PDA stenoses on cath in 5784 not amenable to
intervention/medical management), now with new chest pain and
planned hysterectomy for endometrial cancer.
Cardiac/Coronary  CT
TECHNIQUE: The patient was scanned on a [employer] Force scanner.

[Series 10: multiphase · axial · 0.45mm/px · z∈[+1153,+1286]mm · 5 of 1328 slices shown, 7 images]
[im 222/1328  vessel]
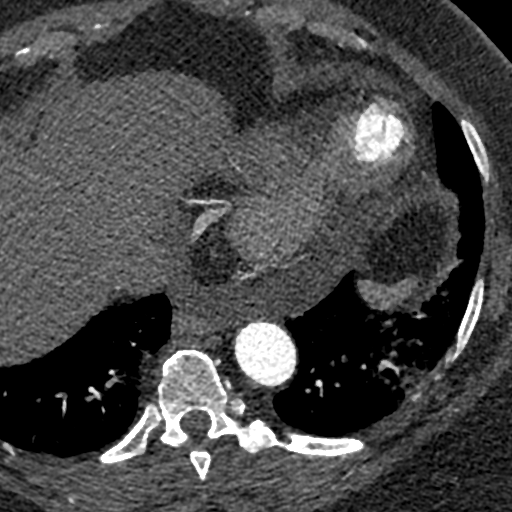
[im 222/1328  lung]
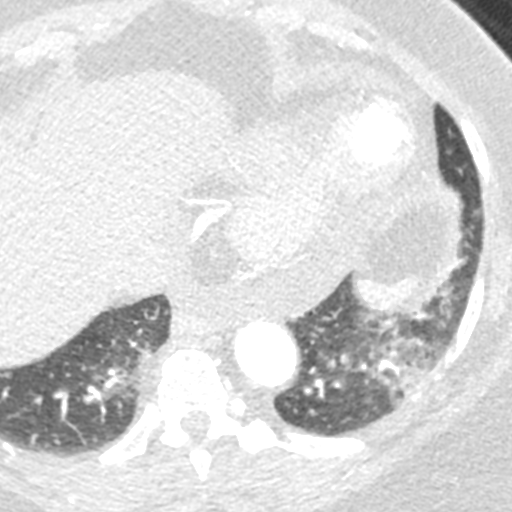
[im 443/1328  vessel]
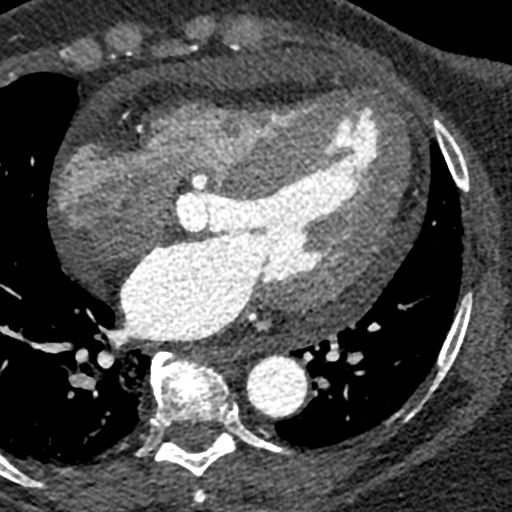
[im 664/1328  vessel]
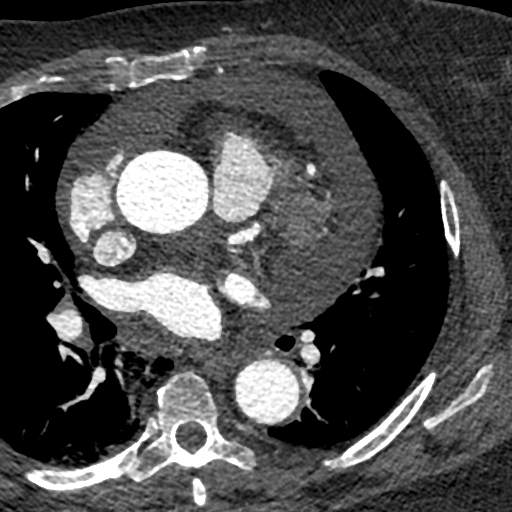
[im 885/1328  vessel]
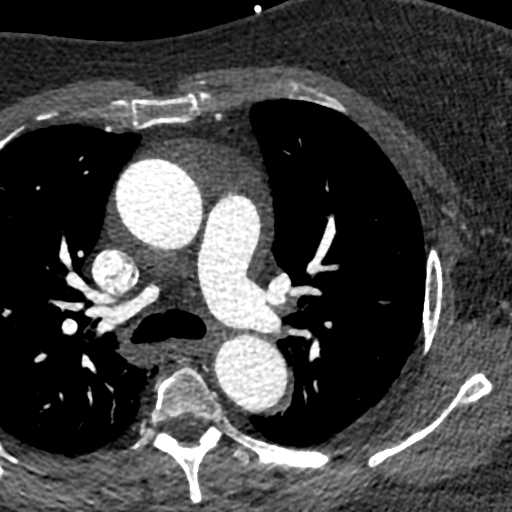
[im 1106/1328  vessel]
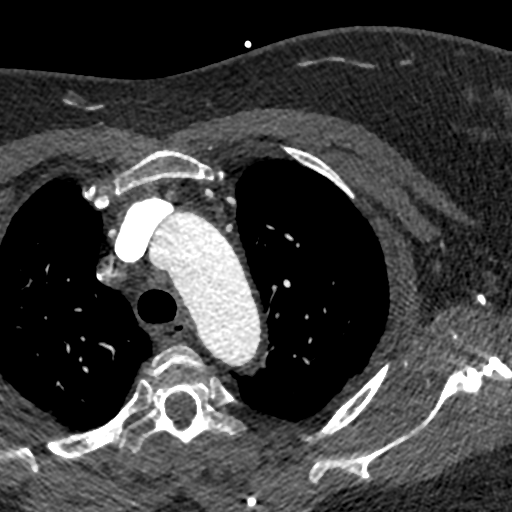
[im 1106/1328  lung]
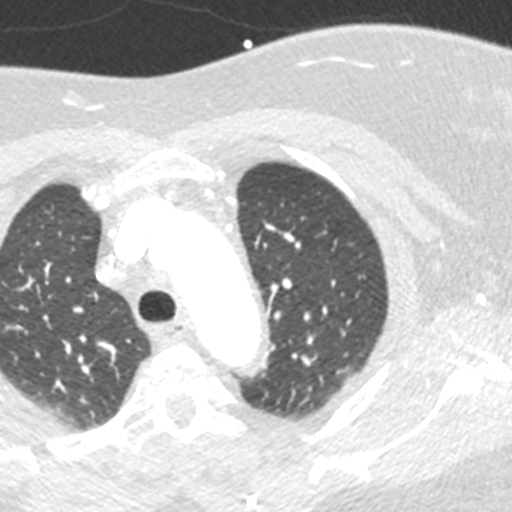

[5 of 20 positions shown; findings below may reference images not displayed]

FINDINGS: Within the visualized portions of the thorax there are no suspicious
appearing pulmonary nodules or masses, there is no acute
consolidative airspace disease, no pleural effusions, no
pneumothorax and no lymphadenopathy. Visualized portions of the
upper abdomen are unremarkable. There are no aggressive appearing
lytic or blastic lesions noted in the visualized portions of the
skeleton.
IMPRESSION: No significant incidental noncardiac findings are noted.
FINDINGS: A 120 kV prospective scan was triggered in the descending thoracic
aorta at 111 HU's. Axial non-contrast 3 mm slices were carried out
through the heart. The data set was analyzed on a dedicated work
station and scored using the Agatson method. Gantry rotation speed
was 250 msecs and collimation was .6 mm. No beta blockade and 0.8 mg
of sl NTG was given. The 3D data set was reconstructed in 5%
intervals of the 67-82 % of the R-R cycle. Diastolic phases were
analyzed on a dedicated work station using MPR, MIP and VRT modes.
The patient received 80 cc of contrast.

Aorta: Upper normal size with maximum diameter of the ascending
aorta measuring 39 mm. Minimal diffuse calcifications. No
dissection.

Aortic Valve:  Trileaflet.  No calcifications.

Coronary Arteries: Normal coronary origin. Right and left
co-dominance.

RCA is a small lumen artery that gives rise to PDA. There s mild
plaque in the proximal RCA and severe > 70% stenosis in the distal
RCA. PDA is small and poorly visualized.

Left main is a large caliber, short artery that gives rise to LAD
and LCX arteries. There is minimal eccentric calcified plaque with
stenosis 0-25%.

LAD is a large vessel that gives rise to two diagonal arteries.
There is minimal calcified plaque with stenosis 0-25% in the
proximal segment. Mid LAD has severe non-calcified plaque with
stenosis > 70%. Mid to distal LAD is occluded. There is an apical
aneurysm.

D1 is a small branches with at least moderate ostial non-calcified
plaque.

D2 is very small with diffuse plaque.

LCX is a medium size that gives rise to one small OM1 branch. There
is mild plaque in the proximal and mid segment, distal LCX/PLA have
mild diffuse plaque.

OM1 is a small branch with severe stenosis in the mid portion.

Other findings:

Normal pulmonary vein drainage into the left atrium.

Normal let atrial appendage without a thrombus.

Normal size of the pulmonary artery.
IMPRESSION: 1. Coronary calcium score of 258. This was 96 percentile for age and
sex matched control.

2. Normal coronary origin with right dominance.

3. Severe stenosis in a small distal RCA previously not amenable to
intervention. Severe stenosis in the distal LAD and occluded mid to
distal LAD - chronic occlusion given apical aneurysm (no thrombus
within the aneurysm).

At least moderate stenoses in the ostial portion of the first
diagonal artery and mid portion of OM1.

4. Unchanged mild to moderate circumferential pericardial effusion
(when compared to the chest CTA from 08/17/2018).

5. No evidence for an intracardiac mass.

Additional analysis with CT FFR will be submitted.

*** End of Addendum ***
EXAM:
OVER-READ INTERPRETATION  CT CHEST

The following report is an over-read performed by radiologist Dr.
Fys Hajee [REDACTED] on 09/13/2018. This
over-read does not include interpretation of cardiac or coronary
anatomy or pathology. The coronary calcium score/coronary CTA
interpretation by the cardiologist is attached.
FINDINGS: Within the visualized portions of the thorax there are no suspicious
appearing pulmonary nodules or masses, there is no acute
consolidative airspace disease, no pleural effusions, no
pneumothorax and no lymphadenopathy. Visualized portions of the
upper abdomen are unremarkable. There are no aggressive appearing
lytic or blastic lesions noted in the visualized portions of the
skeleton.
IMPRESSION: No significant incidental noncardiac findings are noted.

## 2018-09-13 MED ORDER — DILTIAZEM HCL 25 MG/5ML IV SOLN
10.0000 mg | Freq: Once | INTRAVENOUS | Status: AC
  Administered 2018-09-13: 10:00:00 10 mg via INTRAVENOUS
  Filled 2018-09-13: qty 5

## 2018-09-13 MED ORDER — DILTIAZEM HCL 25 MG/5ML IV SOLN
5.0000 mg | Freq: Once | INTRAVENOUS | Status: AC
  Administered 2018-09-13: 10:00:00 5 mg via INTRAVENOUS
  Filled 2018-09-13: qty 5

## 2018-09-13 MED ORDER — IOHEXOL 350 MG/ML SOLN
120.0000 mL | Freq: Once | INTRAVENOUS | Status: AC | PRN
  Administered 2018-09-13: 120 mL via INTRAVENOUS

## 2018-09-13 MED ORDER — DILTIAZEM HCL 25 MG/5ML IV SOLN
INTRAVENOUS | Status: AC
  Administered 2018-09-13: 10:00:00 10 mg via INTRAVENOUS
  Filled 2018-09-13: qty 5

## 2018-09-13 MED ORDER — NITROGLYCERIN 0.4 MG SL SUBL
SUBLINGUAL_TABLET | SUBLINGUAL | Status: AC
  Filled 2018-09-13: qty 2

## 2018-09-13 MED ORDER — METOPROLOL TARTRATE 5 MG/5ML IV SOLN
5.0000 mg | INTRAVENOUS | Status: DC | PRN
  Administered 2018-09-13 (×4): 5 mg via INTRAVENOUS
  Filled 2018-09-13 (×4): qty 5

## 2018-09-13 MED ORDER — NITROGLYCERIN 0.4 MG SL SUBL
0.8000 mg | SUBLINGUAL_TABLET | Freq: Once | SUBLINGUAL | Status: AC
  Administered 2018-09-13: 10:00:00 0.8 mg via SUBLINGUAL
  Filled 2018-09-13: qty 25

## 2018-09-13 MED ORDER — METOPROLOL TARTRATE 5 MG/5ML IV SOLN
INTRAVENOUS | Status: AC
  Administered 2018-09-13: 5 mg via INTRAVENOUS
  Filled 2018-09-13: qty 15

## 2018-09-13 NOTE — Progress Notes (Signed)
Notified Dr. Meda Coffee that HR was still in 80's after 20mg  IV lopressor.  Order received for 10mg  IV Cardizem and to call back after admin.

## 2018-09-13 NOTE — Progress Notes (Signed)
Notified Dr. Meda Coffee HR 71 after 10mg  IV cardizem.  Order received to give an additional 5mg  IV cardizem and OK to scan patient.  0.8mg  Nitro tabs to be administered right before CTA.

## 2018-09-13 NOTE — Progress Notes (Signed)
Pt tolerated exam without incident.  Pt denies SOB, CP, dizziness, or headache.  PIV removed and bandage applied.  Discharge instructions discussed and handouts given to patient.  Pt discharged

## 2018-09-13 NOTE — Discharge Instructions (Signed)
Cardiac CT Angiogram  A cardiac CT angiogram is a procedure to look at the heart and the area around the heart. It may be done to help find the cause of chest pains or other symptoms of heart disease. During this procedure, a large X-ray machine, called a CT scanner, takes detailed pictures of the heart and the surrounding area after a dye (contrast material) has been injected into blood vessels in the area. The procedure is also sometimes called a coronary CT angiogram, coronary artery scanning, or CTA. A cardiac CT angiogram allows the health care provider to see how well blood is flowing to and from the heart. The health care provider will be able to see if there are any problems, such as:  Blockage or narrowing of the coronary arteries in the heart.  Fluid around the heart.  Signs of weakness or disease in the muscles, valves, and tissues of the heart. Tell a health care provider about:  Any allergies you have. This is especially important if you have had a previous allergic reaction to contrast dye.  All medicines you are taking, including vitamins, herbs, eye drops, creams, and over-the-counter medicines.  Any blood disorders you have.  Any surgeries you have had.  Any medical conditions you have.  Whether you are pregnant or may be pregnant.  Any anxiety disorders, chronic pain, or other conditions you have that may increase your stress or prevent you from lying still. What are the risks? Generally, this is a safe procedure. However, problems may occur, including:  Bleeding.  Infection.  Allergic reactions to medicines or dyes.  Damage to other structures or organs.  Kidney damage from the dye or contrast that is used.  Increased risk of cancer from radiation exposure. This risk is low. Talk with your health care provider about: ? The risks and benefits of testing. ? How you can receive the lowest dose of radiation. What happens before the procedure?  Wear  comfortable clothing and remove any jewelry, glasses, dentures, and hearing aids.  Follow instructions from your health care provider about eating and drinking. This may include: ? For 12 hours before the test -- avoid caffeine. This includes tea, coffee, soda, energy drinks, and diet pills. Drink plenty of water or other fluids that do not have caffeine in them. Being well-hydrated can prevent complications. ? For 4-6 hours before the test -- stop eating and drinking. The contrast dye can cause nausea, but this is less likely if your stomach is empty.  Ask your health care provider about changing or stopping your regular medicines. This is especially important if you are taking diabetes medicines, blood thinners, or medicines to treat erectile dysfunction. What happens during the procedure?  Hair on your chest may need to be removed so that small sticky patches called electrodes can be placed on your chest. These will transmit information that helps to monitor your heart during the test.  An IV tube will be inserted into one of your veins.  You might be given a medicine to control your heart rate during the test. This will help to ensure that good images are obtained.  You will be asked to lie on an exam table. This table will slide in and out of the CT machine during the procedure.  Contrast dye will be injected into the IV tube. You might feel warm, or you may get a metallic taste in your mouth.  You will be given a medicine (nitroglycerin) to relax (dilate) the arteries  in your heart.  The table that you are lying on will move into the CT machine tunnel for the scan.  The person running the machine will give you instructions while the scans are being done. You may be asked to: ? Keep your arms above your head. ? Hold your breath. ? Stay very still, even if the table is moving.  When the scanning is complete, you will be moved out of the machine.  The IV tube will be removed. The  procedure may vary among health care providers and hospitals. What happens after the procedure?  You might feel warm, or you may get a metallic taste in your mouth from the contrast dye.  You may have a headache from the nitroglycerin.  After the procedure, drink water or other fluids to wash (flush) the contrast material out of your body.  Contact a health care provider if you have any symptoms of allergy to the contrast. These symptoms include: ? Shortness of breath. ? Rash or hives. ? A racing heartbeat.  Most people can return to their normal activities right after the procedure. Ask your health care provider what activities are safe for you.  It is up to you to get the results of your procedure. Ask your health care provider, or the department that is doing the procedure, when your results will be ready. Summary  A cardiac CT angiogram is a procedure to look at the heart and the area around the heart. It may be done to help find the cause of chest pains or other symptoms of heart disease.  During this procedure, a large X-ray machine, called a CT scanner, takes detailed pictures of the heart and the surrounding area after a dye (contrast material) has been injected into blood vessels in the area.  Ask your health care provider about changing or stopping your regular medicines before the procedure. This is especially important if you are taking diabetes medicines, blood thinners, or medicines to treat erectile dysfunction.  After the procedure, drink water or other fluids to wash (flush) the contrast material out of your body. This information is not intended to replace advice given to you by your health care provider. Make sure you discuss any questions you have with your health care provider. Document Released: 04/29/2008 Document Revised: 04/05/2016 Document Reviewed: 04/05/2016 Elsevier Interactive Patient Education  2019 [employer] Need to Know About IV Contrast  Material IV contrast material is most often a fluid that is used with some imaging tests. Contrast material is injected into your body through a vein to help your health care providers see your organs and tissues more clearly. It may be used with:  X-ray.  MRI.  CT.  Ultrasound. Contrast material is used when your health care providers need a detailed look at organs, tissues, or blood vessels that may not show up with the standard test. IV contrast may be used for imaging tests that examine:  Muscles, skin, and fat.  Breasts.  Brain.  Digestive tract.  Heart.  Liver.  Lungs and many other internal organs. What are the risks of using IV contrast material? The risks of using IV contrast material include:  Headache.  Itching, skin rash, and hives.  Allergic reactions.  Nausea and vomiting.  Wheezing or difficulty breathing.  Abnormal heart rate.  Blood pressure changes.  Throat swelling.  Kidney damage. These complications are more likely to occur in people who:  Have kidney failure.  Have liver problems.  Have  certain heart problems, including: ? Heart failure. ? Heart attack. ? Heart infection. ? Heart valve problems.  Abuse alcohol.  Have allergies or asthma.  Are dehydrated.  Have sickle cell anemia or similar problems.  Have had trouble with IV contrast material in the past.  Take certain medicines, such as: ? Metformin. ? NSAIDs. ? Beta blockers. ? Interleukin-2. How do I prepare for my test with IV contrast material?  Follow instructions from your health care provider about eating or drinking restrictions.  Ask your health care provider about changing or stopping your regular medicines. This is especially important if you are taking diabetes medicines or blood thinners.  Tell your health care provider about: ? Any previous illnesses, surgeries, or pre-existing medical conditions. ? Whether you are pregnant or may be  pregnant. ? Whether you are breastfeeding. Most contrast agents are safe for use in breastfeeding women.  You may have a physical exam to determine any potential risks.  Ask if you will be given a medicine (sedative) to help you relax during the procedure. If so, plan to have someone take you home after test. What happens during the test with IV contrast material?   You may be given a sedative to help you relax.  A needle will be inserted into one of your veins to administer the IV contrast material.  You may feel warmth or flushing as the material enters your bloodstream.  You may have a metallic taste in your mouth for a few minutes.  The needle may cause some discomfort and bruising.  After the contrast material is in your body, the imaging test will be done. The procedure may vary among health care providers and hospitals. What happens after the test with IV contrast material?  You may be asked to drink water or other fluids to wash (flush) the contrast material out of your body.  Drink enough fluid to keep your urine pale yellow.  Do not drive for 24 hours if you received a sedative.  It is your responsibility to get your test results. Ask your health care provider or the department performing the test when your results will be ready. Contact a health care provider if:  You have redness, swelling, or pain near your IV site. Get help right away if:  You have an abnormal heart rhythm.  You have trouble breathing.  You have: ? Chest pain. ? Pain in your back, neck, arm, jaw, or stomach. ? Nausea or sweating. ? Hives or a rash.  You start shaking and cannot stop. These symptoms may represent a serious problem that is an emergency. Do not wait to see if the symptoms will go away. Get medical help right away. Call your local emergency services (911 in the [place] Do not drive yourself to the hospital. Summary  IV contrast may be used for imaging tests to help your  health care providers see your organs and tissues more clearly.  Tell your health care provider if you are pregnant or may be pregnant.  During the procedure, you may feel warmth or flushing as the material enters your bloodstream.  After the procedure, drink enough fluid to keep your urine pale yellow. This information is not intended to replace advice given to you by your health care provider. Make sure you discuss any questions you have with your health care provider. Document Released: 05/05/2009 Document Revised: 01/09/2018 Document Reviewed: 01/22/2015 Elsevier Interactive Patient Education  2019 [employer].

## 2018-09-15 NOTE — Telephone Encounter (Signed)
F/U Message             Patient is retuning Alisha's call

## 2018-09-15 NOTE — Telephone Encounter (Signed)
Pt updated with test results along with Dr. Judeth Cornfield recommendations. Pt would like virtual appointment to discuss results. Appointment scheduled for 4/20 at 2 pm.

## 2018-09-18 ENCOUNTER — Telehealth (INDEPENDENT_AMBULATORY_CARE_PROVIDER_SITE_OTHER): Payer: Self-pay | Admitting: Cardiology

## 2018-09-18 VITALS — BP 154/108 | HR 97 | Wt 280.0 lb

## 2018-09-18 DIAGNOSIS — I2510 Atherosclerotic heart disease of native coronary artery without angina pectoris: Secondary | ICD-10-CM

## 2018-09-18 DIAGNOSIS — Z712 Person consulting for explanation of examination or test findings: Secondary | ICD-10-CM

## 2018-09-18 DIAGNOSIS — Z7189 Other specified counseling: Secondary | ICD-10-CM

## 2018-09-18 DIAGNOSIS — IMO0002 Reserved for concepts with insufficient information to code with codable children: Secondary | ICD-10-CM

## 2018-09-18 DIAGNOSIS — I25118 Atherosclerotic heart disease of native coronary artery with other forms of angina pectoris: Secondary | ICD-10-CM

## 2018-09-18 DIAGNOSIS — E1165 Type 2 diabetes mellitus with hyperglycemia: Secondary | ICD-10-CM

## 2018-09-18 DIAGNOSIS — Z79899 Other long term (current) drug therapy: Secondary | ICD-10-CM

## 2018-09-18 DIAGNOSIS — E118 Type 2 diabetes mellitus with unspecified complications: Secondary | ICD-10-CM

## 2018-09-18 MED ORDER — ISOSORBIDE MONONITRATE ER 60 MG PO TB24: 60.0000 mg | ORAL_TABLET | Freq: Every day | ORAL | 3 refills | Status: DC

## 2018-09-18 MED ORDER — NITROGLYCERIN 0.4 MG SL SUBL: SUBLINGUAL_TABLET | SUBLINGUAL | 6 refills | Status: DC

## 2018-09-18 MED FILL — ISOSORBIDE MN ER 60 MG TAB: 60 | 30 days supply | Qty: 30 | Fill #0

## 2018-09-18 MED FILL — NITROGLYCERIN 0.4 MG TAB SL: 0.4 | 25 days supply | Qty: 25 | Fill #0

## 2018-09-18 NOTE — Progress Notes (Signed)
Virtual Visit via Telephone Note   This visit type was conducted due to national recommendations for restrictions regarding the COVID-19 Pandemic (e.g. social distancing) in an effort to limit this patient's exposure and mitigate transmission in our community.  Due to her co-morbid illnesses, this patient is at least at moderate risk for complications without adequate follow up.  This format is felt to be most appropriate for this patient at this time.  The patient did not have access to video technology/had technical difficulties with video requiring transitioning to audio format only (telephone).  All issues noted in this document were discussed and addressed.  No physical exam could be performed with this format.  Please refer to the patient's chart for her  consent to telehealth for [facility].   Evaluation Performed:  Follow-up visit  Date:  09/18/2018   ID:  Kimberly Frazier, DOB [birth year], MRN [id number]  Patient Location: Home Provider Location: Home  PCP:  Ladell Pier, MD  Cardiologist:  Buford Dresser, MD  Electrophysiologist:  None   Chief Complaint:  Discuss test results  History of Present Illness:    Kimberly Frazier is a 58 y.o. female with PMH CAD who had initial telehealth virtual visit on 09/01/18 for preoperative evaluation (hysterectomy for endometrial cancer). Several concerns manifested during that visit, and she had additional testing done. Visit today is to review test results and discuss next steps.   The patient does not have symptoms concerning for COVID-19 infection (fever, chills, cough, or new shortness of breath).   No changes to her symptoms since telephone visit 09/28/18. Still has exertional anginal symptoms. Fatigues after walking several steps, cannot climb stairs. Not accelerating.   Denies shortness of breath at rest. No PND, orthopnea, LE edema or unexpected weight gain. No syncope or palpitations.  We reviewed the results of her tests  extensively.  CTA: -aorta is mildly enlarged, not severe, and no evidence of it leaking, which is reassuring -Right coronary has the severe downstream disease that was seen on the prior cath -LAD (front artery of the heart) is completely closed from the middle section to the end. This is not new. It has affect the muscle in the apex of the heart, and that muscle is now scarred, which tells [place] that this happened some time ago. There is a small branch off of this artery that is narrowed by plaque, but it is a small vessel, so unlikely that a stent would be helpful -The left circumflex (left side of the heart) has some mild plaque, and there is a branch vessel with a severe narrowing, but unlikely to be fixed by stenting. -coronary calcium score, which is an overall maker of plaque, is 96th percentile (very high) for age. -there is no evidence of a mass in the heart, which is good.  Echo shows effusion without tamponade. There is akinesis of the entire apical region, but overall LV function is preserved. 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  We discussed that on review of her imaging, it is unlikely that she has disease amenable to stenting at this time. We discussed options for surgical consult for bypass or attempt at medical management. She wishes to try medical management at this time. We also discussed the importance of controlling her risk factors aggressively. This includes hypertension, lipids, diabetes, and weight.   Past Medical History:  Diagnosis Date  . Adrenal adenoma, left   . Arthritis   . CKD (chronic kidney disease), stage II   . Coronary artery  disease 07-28-2018 followed [facility])  currently due to no insurance   per cardiac cath 02-06-2015 (positive mild lateral ishcemia on stress test)--- dLAD 80%,  mLAD 40%,  mPDA 80% (small vessel),  ostial D1 70%,  CFx with lumial irregarlities-- medical management  . Depression   . Diabetic neuropathy ([place])   . History of Bell's  palsy 07/2011   per pt residual facial pain on left side occasionally  . History of cancer of vagina 1999   per pt completed radiation and chemo  . History of sepsis 12/30/2017   positive blood culter fro E.coli  . Hyperlipidemia   . Hypertension   . Hypokalemia   . Insulin dependent type 2 diabetes mellitus, uncontrolled ([place])    followed by pcp---  A1c was 11.7 on 06-15-2018 in epic  . Nocturia   . Peripheral neuropathy   . PMB (postmenopausal bleeding)   . Wears dentures    upper  . Wears glasses    Past Surgical History:  Procedure Laterality Date  . BREAST BIOPSY  2012   benign  . CARDIAC CATHETERIZATION N/A 02/06/2015   Procedure: Left Heart Cath and Coronary Angiography;  Surgeon: Larey Dresser, MD;  Location: [facility];  Service: Cardiovascular;  Laterality: N/A;  . HYSTEROSCOPY W/D&C N/A 08/01/2018   Procedure: DILATATION AND CURETTAGE /HYSTEROSCOPY;  Surgeon: Mora Bellman, MD;  Location: [facility];  Service: Gynecology;  Laterality: N/A;  . UMBILICAL HERNIA REPAIR  child     Current Meds  Medication Sig  . aspirin EC 81 MG tablet Take 1 tablet (81 mg total) daily by mouth.  Marland Kitchen atorvastatin (LIPITOR) 40 MG tablet TAKE 1 TABLET DAILY BY MOUTH.  . BD PEN NEEDLE NANO U/F 32G X 4 MM MISC USE TWICE DAILY AS DIRECTED  . Blood Glucose Monitoring Suppl (TRUE METRIX METER) w/Device KIT Use as directed  . carvedilol (COREG) 25 MG tablet TAKE 1 TABLET (25 MG TOTAL) BY MOUTH 2 (TWO) TIMES DAILY.  . Dulaglutide (TRULICITY) 8.46 NG/2.9BM SOPN Inject 0.75 mg into the skin once a week. (Patient taking differently: Inject 0.75 mg into the skin once a week. Saturday's)  . gabapentin (NEURONTIN) 300 MG capsule Take 1 capsule (300 mg total) by mouth at bedtime. (Patient taking differently: Take 300 mg by mouth as needed. )  . glucose blood (TRUE METRIX BLOOD GLUCOSE TEST) test strip Use as instructed  . ibuprofen (ADVIL,MOTRIN) 600 MG tablet Take 1 tablet (600 mg  total) by mouth every 6 (six) hours as needed.  . Insulin Glargine (LANTUS SOLOSTAR) 100 UNIT/ML Solostar Pen Inject 65 Units into the skin daily.  . insulin lispro (HUMALOG) 100 UNIT/ML injection INJECT 28 UNITS INTO THE SKIN THREE TIMES DAILY BEFORE MEALS.  Marland Kitchen Insulin Pen Needle (ULTICARE MICRO PEN NEEDLES) 32G X 4 MM MISC Use as directed with insulin  . nitroGLYCERIN (NITROSTAT) 0.4 MG SL tablet Place 1 tab under tongue for chest pain.  May repeat after 5 minutes x 2.  DO NOT TAKE MORE THAN 3 TABS DURING AN EPISODE OF CHEST PAIN  . potassium chloride (KLOR-CON) 8 MEQ tablet Take 2 tablets (16 mEq total) by mouth daily. (Patient taking differently: Take 8 mEq by mouth 2 (two) times daily. )  . traMADol (ULTRAM) 50 MG tablet Take 1 tablet (50 mg total) by mouth every 6 (six) hours as needed for severe pain. For AFTER surgery, do not take and drive  . TRUEPLUS LANCETS 28G MISC Use  as directted     Allergies:   Patient has no known allergies.   Social History   Tobacco Use  . Smoking status: Never Smoker  . Smokeless tobacco: Never Used  Substance Use Topics  . Alcohol use: Not Currently  . Drug use: Not Currently    Types: Marijuana    Comment: 07-28-2018  per pt last smoked 12/ 2019     Family Hx: The patient's family history includes Arthritis in an other family member; Cancer in her father and another family member; Diabetes in her maternal aunt, maternal uncle, mother, sister, and another family member; Heart disease in her mother, sister, and another family member; Hyperlipidemia in an other family member; Hypertension in her mother and another family member. There is no history of Breast cancer.  ROS:   Please see the history of present illness.    All other systems reviewed and are negative.   Prior CV studies:   The following studies were reviewed today: Discussed echo from 09/06/18, CT coronary from 09/13/18  Labs/Other Tests and Data Reviewed:    EKG:  An ECG dated 12/31/17  was personally reviewed today and demonstrated:  sinus, inferior infarct, poor R wave progression  Recent Labs: 06/15/2018: ALT 14; Hemoglobin 13.4; Platelets 229 08/11/2018: BUN 17; Creatinine, Ser 1.06; Potassium 3.4; Sodium 142   Recent Lipid Panel Lab Results  Component Value Date/Time   CHOL 234 (H) 04/18/2017 03:01 PM   TRIG 225 (H) 04/18/2017 03:01 PM   HDL 42 04/18/2017 03:01 PM   CHOLHDL 5.6 (H) 04/18/2017 03:01 PM   CHOLHDL 5 07/15/2015 03:13 PM   LDLCALC 147 (H) 04/18/2017 03:01 PM   LDLDIRECT 93.0 07/15/2015 03:13 PM    Wt Readings from Last 3 Encounters:  09/18/18 280 lb (127 kg)  09/01/18 279 lb (126.6 kg)  08/11/18 276 lb 8 oz (125.4 kg)     Objective:    Vital Signs:  BP (!) 154/108   Pulse 97   Wt 280 lb (127 kg)   BMI 48.06 kg/m     ASSESSMENT & PLAN:    CAD: progression since prior cath, now with CTO of LAD at mid-LAD 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 -needs to get good control of risk factors: -A1c was 10.0. Recommend referral to endocrinologist given her comorbid CAD. From a cardiovascular standpoint, would consider SGLT2 inhibitor -lipids-->checked after visit--results 09/20/18 show LDL 68 (goal <70), TG 123, HDL 38. Continue atorvastatin 40 mg daily for hyperlipidemia -hypertension: above goal of <130/80 today. Given angina, will start with imdur for both blood pressure and anginal symptoms. Will start at 60 mg daily dose, expect we will be able to uptitrate -refilled nitroglycerin, given instructions on use -continue aspirin 81 mg daily -continue carevedilol 25 mg BID -discussed avoidance of NSAIDs, danger of MI with CAD. -after surgery, needs intense focus on lifestyle and weight loss. -denies sleep apnea symptoms previously, but would continue to assess  Endometrial cancer pending surgery: she has stable angina, and her imaging now shows CTO LAD with apical akinesis but overall preserved EF.  -we are aggressively treating CAD -we  are uptitrating imdur for angina. Already on max beta blocker -on high intensity statin and aspirin -based on imaging, unlikely that she has good targets for stenting. Given her diabetes, she would likely need CABG, though we would need to get cath first for definitive anatomy.  -given that her surgery is for cancer, I do not think we should withold surgery. We  spoke at length that she is at risk for perioperative MI given her CAD and risk factors, but short of CABG (which is higher risk than hysterectomy), she is otherwise optimized. Her risk of progressive cancer with delay of treatment is different than if this were a completely elective surgery, and therefore delay carries its own risk. Therefore, I recommend the following:  1) continue aspirin, beta blocker, and statin throughout the operative period 2) get updated ECG prior to surgery (can be done in PACU) to have a baseline 3) Monitor closely perioperatively; consider monitoring for 48 hour period post op 4) avoid high/low blood pressure and wide swings in hemodynamics 5) If she has chest pain, would send troponins and get ECG 6) If there are concerns postoperatively, cardiology is available for consultation. Please page the cardmaster or on call team.  She and I discussed the recommendations and the risk at length. She understandably does not want to delay cancer surgery, and she understands that there is some risk, but with close monitoring these can be mitigated. She wants to proceed with surgery as soon as it is available.  She knows when to call EMS to seek medical attention (discussed again when discussing use of nitroglycerin). Also knows to call the office with questions or concerns. We will keep her regimen steady for now, and then after she has recovered from surgery we will see how she is doing and make any additional changes.  COVID-19 Education: The signs and symptoms of COVID-19 were discussed with the patient and how to seek  care for testing (follow up with PCP or arrange E-visit).  The importance of social distancing was discussed today.  Time:   Today, I have spent 42 minutes with the patient with telehealth technology discussing the above problems.    Patient Instructions  Medication Instructions:  Start: Imdur 60 mg daily  If you need a refill on your cardiac medications before your next appointment, please call your pharmacy.   Lab work: Your physician recommends that you return for lab work in 1 week (lipid)  If you have labs (blood work) drawn today and your tests are completely normal, you will receive your results only by: Marland Kitchen MyChart Message (if you have MyChart) OR . A paper copy in the mail If you have any lab test that is abnormal or we need to change your treatment, we will call you to review the results.  Testing/Procedures: None  Follow-Up: At [facility], you and your health needs are our priority.  As part of our continuing mission to provide you with exceptional heart care, we have created designated Provider Care Teams.  These Care Teams include your primary Cardiologist (physician) and Advanced Practice Providers (APPs -  Physician Assistants and Nurse Practitioners) who all work together to provide you with the care you need, when you need it. You will need a follow up appointment in 2 months.  Please call our office 2 months in advance to schedule this appointment.  You may see Buford Dresser, MD or one of the following Advanced Practice Providers on your designated Care Team:   Rosaria Ferries, PA-C . Jory Sims, DNP, ANP      Medication Adjustments/Labs and Tests Ordered: Current medicines are reviewed at length with the patient today.  Concerns regarding medicines are outlined above.   Tests Ordered: Orders Placed This Encounter  Procedures  . Lipid panel    Medication Changes: Meds ordered this encounter  Medications  . nitroGLYCERIN (NITROSTAT) 0.4  MG  SL tablet    Sig: Place 1 tab under tongue for chest pain.  May repeat after 5 minutes x 2.  DO NOT TAKE MORE THAN 3 TABS DURING AN EPISODE OF CHEST PAIN    Dispense:  25 tablet    Refill:  6  . isosorbide mononitrate (IMDUR) 60 MG 24 hr tablet    Sig: Take 1 tablet (60 mg total) by mouth daily.    Dispense:  90 tablet    Refill:  3    Disposition:  Follow up 2 mos  Signed, Buford Dresser, MD  09/18/2018 2:02 PM    [place]

## 2018-09-18 NOTE — Patient Instructions (Addendum)
Medication Instructions:  Start: Imdur 60 mg daily  If you need a refill on your cardiac medications before your next appointment, please call your pharmacy.   Lab work: Your physician recommends that you return for lab work in 1 week (lipid)  If you have labs (blood work) drawn today and your tests are completely normal, you will receive your results only by: Marland Kitchen MyChart Message (if you have MyChart) OR . A paper copy in the mail If you have any lab test that is abnormal or we need to change your treatment, we will call you to review the results.  Testing/Procedures: None  Follow-Up: At [facility], you and your health needs are our priority.  As part of our continuing mission to provide you with exceptional heart care, we have created designated Provider Care Teams.  These Care Teams include your primary Cardiologist (physician) and Advanced Practice Providers (APPs -  Physician Assistants and Nurse Practitioners) who all work together to provide you with the care you need, when you need it. You will need a follow up appointment in 2 months.  Please call our office 2 months in advance to schedule this appointment.  You may see Buford Dresser, MD or one of the following Advanced Practice Providers on your designated Care Team:   Rosaria Ferries, PA-C . Jory Sims, DNP, ANP

## 2018-09-19 ENCOUNTER — Encounter: Payer: Self-pay | Admitting: Cardiology

## 2018-09-20 LAB — LIPID PANEL
Chol/HDL Ratio: 3.4 ratio (ref 0.0–4.4)
Cholesterol, Total: 131 mg/dL (ref 100–199)
HDL: 38 mg/dL — ABNORMAL LOW (ref >39)
LDL Calculated: 68 mg/dL (ref 0–99)
Triglycerides: 123 mg/dL (ref 0–149)
VLDL Cholesterol Cal: 25 mg/dL (ref 5–40)

## 2018-09-27 ENCOUNTER — Telehealth: Payer: Self-pay | Admitting: Oncology

## 2018-09-27 NOTE — Telephone Encounter (Signed)
Left a message for Dr. Wynetta Emery at [facility] requesting that patient's Hbg A1C is repeated to see if she will be able to have surgery.

## 2018-10-03 ENCOUNTER — Encounter: Payer: Self-pay | Admitting: Internal Medicine

## 2018-10-03 ENCOUNTER — Ambulatory Visit: Payer: Self-pay | Attending: Internal Medicine | Admitting: Internal Medicine

## 2018-10-03 ENCOUNTER — Other Ambulatory Visit: Payer: Self-pay

## 2018-10-03 DIAGNOSIS — IMO0002 Reserved for concepts with insufficient information to code with codable children: Secondary | ICD-10-CM

## 2018-10-03 DIAGNOSIS — E1165 Type 2 diabetes mellitus with hyperglycemia: Secondary | ICD-10-CM

## 2018-10-03 DIAGNOSIS — I25118 Atherosclerotic heart disease of native coronary artery with other forms of angina pectoris: Secondary | ICD-10-CM

## 2018-10-03 DIAGNOSIS — I10 Essential (primary) hypertension: Secondary | ICD-10-CM

## 2018-10-03 DIAGNOSIS — C55 Malignant neoplasm of uterus, part unspecified: Secondary | ICD-10-CM

## 2018-10-03 DIAGNOSIS — E1142 Type 2 diabetes mellitus with diabetic polyneuropathy: Secondary | ICD-10-CM

## 2018-10-03 MED ORDER — AMLODIPINE BESYLATE 5 MG PO TABS: 5.0000 mg | ORAL_TABLET | Freq: Every day | ORAL | 3 refills | Status: DC

## 2018-10-03 MED FILL — ?AMLODIPINE BESYLATE 5MG TA: 5 | 90 days supply | Qty: 90 | Fill #0

## 2018-10-03 NOTE — Progress Notes (Signed)
Virtual Visit via Telephone Note  I connected with [name] on 10/03/18 at 2:02 p.m by telephone  from my office and verified that I am speaking with the correct person using two identifiers.  Patient is at home.  Only the patient and myself participated in this encounter.   I discussed the limitations, risks, security and privacy concerns of performing an evaluation and management service by telephone and the availability of in person appointments. I also discussed with the patient that there may be a patient responsible charge related to this service. The patient expressed understanding and agreed to proceed.   History of Present Illness: Patient with history of diabetes with neuropathy, HTN with hypertensive heart disease, CAD,HLand obesity.  Last seen by me in January of this year.  His last visit with me, pt dx with endometrial cancer and needs hysterectomy.  She was scheduled to have the surgery done on 09/12/2018 but it was canceled due to the COVID-19 pandemic an elevated A1c of 10.  The gynecologist would like to get her A1c less than 8 prior to surgery.    She has seen cardiology for preoperative assessment.  Echo revealed large pericardial effusion with EF of 55 to 60%.  There was severe concentric left ventricular hypertrophy.  Coronary CT revealed occlusion in the mid to distal LAD, distal RCA.  Cardiology states that she will need CABG eventually, but at this time to avoid delay in her getting the hysterectomy, plan is to maximize medical management.  Dr. Judeth Cornfield note from 09/18/2018 reviewed in detail.  Patient reports compliance with medications  DIABETES TYPE 2 Last A1C:   Results for orders placed or performed in visit on 09/18/18  Lipid panel  Result Value Ref Range   Cholesterol, Total 131 100 - 199 mg/dL   Triglycerides 123 0 - 149 mg/dL   HDL 38 (L) >39 mg/dL   VLDL Cholesterol Cal 25 5 - 40 mg/dL   LDL Calculated 68 0 - 99 mg/dL   Chol/HDL Ratio 3.4 0.0 - 4.4  ratio    Med Adherence:  [x]  Yes.  Takes Lantus 65 units QHS, Humalog 28 units TID and Trulicity once  wk     Medication side effects:  []  Yes    [x]  No Home Monitoring?  [x]  Yes, once a day around midday before lunch    Home glucose results range: 133, 156, 131 Diet Adherence:  Does not eat BF every morning.  Does not drink juices or sodas.  Drinks Gatorade and water Exercise: []  Yes    [x]  No, states she was told by cardiologist that even though she needs exercise, she should not push herself too hard with her heart being in the condition it is in currently Hypoglycemic episodes?: [x]  Yes, occasionally Numbness of the feet? []  Yes    [x]  No  HTN:  Checks BP daily.  Recent readings 138/86, 187/113, 151/101, 117/77   Reports compliance with meds and limits salt intake Occasional CP.  No recent use of SL Nitro No LE edema SOB sometimes "when I'm sleeping on my back."  Observations/Objective: No direct observation done as this was a telephone encounter.  Lab Results  Component Value Date   WBC 12.3 (H) 06/15/2018   HGB 13.4 06/15/2018   HCT 40.3 06/15/2018   MCV 88 06/15/2018   PLT 229 06/15/2018     Chemistry      Component Value Date/Time   NA 142 08/11/2018 1401   NA 139 06/15/2018 1056  K 3.4 (L) 08/11/2018 1401   CL 105 08/11/2018 1401   CO2 27 08/11/2018 1401   BUN 17 08/11/2018 1401   BUN 20 06/15/2018 1056   CREATININE 1.06 (H) 08/11/2018 1401      Component Value Date/Time   CALCIUM 9.3 08/11/2018 1401   ALKPHOS 113 06/15/2018 1056   AST 14 06/15/2018 1056   ALT 14 06/15/2018 1056   BILITOT 0.5 06/15/2018 1056       Assessment and Plan: 1. Uncontrolled type 2 diabetes mellitus with peripheral neuropathy ([place]) Patient advised to check blood sugars twice a day before breakfast and dinner and keep a log.  I will have her follow-up with me in about 2 to 3 weeks in person.  Advised to bring that log with her.  Will will try to get her in with the endocrinologist  as soon as possible -Plan to repeat A1c on her follow-up visit. - Ambulatory referral to Endocrinology  2. Essential hypertension Not at goal.  Continue carvedilol and Imdur.  Add low-dose amlodipine - amLODipine (NORVASC) 5 MG tablet; Take 1 tablet (5 mg total) by mouth daily.  Dispense: 90 tablet; Refill: 3  3. Coronary artery disease of native artery of native heart with stable angina pectoris ([place]) Followed by cardiology.  According to Dr. Harrell Gave she will eventually need CABG.  Goal at this time is to maximize medical therapy to allow her to have the hysterectomy  4. Malignant neoplasm of uterus, unspecified site [facility]) Followed by gynecology.  Plan for total hysterectomy   Follow Up Instructions: Follow-up with me in person in 2 to 3 weeks   I discussed the assessment and treatment plan with the patient. The patient was provided an opportunity to ask questions and all were answered. The patient agreed with the plan and demonstrated an understanding of the instructions.   The patient was advised to call back or seek an in-person evaluation if the symptoms worsen or if the condition fails to improve as anticipated.  I provided 28 minutes of non-face-to-face time during this encounter.   Karle Plumber, MD

## 2018-10-05 ENCOUNTER — Telehealth: Payer: Self-pay | Admitting: Oncology

## 2018-10-05 NOTE — Telephone Encounter (Signed)
Kimberly Frazier with appointment to see Dr. Kelton Pillar with [facility] tomorrow at 9:50 am. She verbalized understanding and agreement.

## 2018-10-06 ENCOUNTER — Other Ambulatory Visit: Payer: Self-pay

## 2018-10-06 ENCOUNTER — Ambulatory Visit: Payer: Self-pay | Admitting: Gynecologic Oncology

## 2018-10-06 ENCOUNTER — Encounter: Payer: Self-pay | Admitting: Internal Medicine

## 2018-10-06 ENCOUNTER — Ambulatory Visit (INDEPENDENT_AMBULATORY_CARE_PROVIDER_SITE_OTHER): Payer: Self-pay | Admitting: Internal Medicine

## 2018-10-06 VITALS — BP 124/78 | HR 85 | Temp 98.1°F | Ht 64.37 in | Wt 278.2 lb

## 2018-10-06 DIAGNOSIS — E1165 Type 2 diabetes mellitus with hyperglycemia: Secondary | ICD-10-CM | POA: Insufficient documentation

## 2018-10-06 DIAGNOSIS — D3502 Benign neoplasm of left adrenal gland: Secondary | ICD-10-CM

## 2018-10-06 DIAGNOSIS — E1142 Type 2 diabetes mellitus with diabetic polyneuropathy: Secondary | ICD-10-CM

## 2018-10-06 DIAGNOSIS — IMO0002 Reserved for concepts with insufficient information to code with codable children: Secondary | ICD-10-CM

## 2018-10-06 DIAGNOSIS — E1159 Type 2 diabetes mellitus with other circulatory complications: Secondary | ICD-10-CM

## 2018-10-06 DIAGNOSIS — E119 Type 2 diabetes mellitus without complications: Secondary | ICD-10-CM | POA: Insufficient documentation

## 2018-10-06 DIAGNOSIS — Z794 Long term (current) use of insulin: Secondary | ICD-10-CM

## 2018-10-06 LAB — BASIC METABOLIC PANEL
BUN: 22 mg/dL (ref 6–23)
CO2: 30 mEq/L (ref 19–32)
Calcium: 8.9 mg/dL (ref 8.4–10.5)
Chloride: 103 mEq/L (ref 96–112)
Creatinine, Ser: 1.2 mg/dL (ref 0.40–1.20)
GFR: 55.42 mL/min — ABNORMAL LOW (ref >60.00)
Glucose, Bld: 118 mg/dL — ABNORMAL HIGH (ref 70–99)
Potassium: 3.1 mEq/L — ABNORMAL LOW (ref 3.5–5.1)
Sodium: 142 mEq/L (ref 135–145)

## 2018-10-06 MED ORDER — INSULIN GLARGINE 100 UNIT/ML SOLOSTAR PEN: 55.0000 [IU] | PEN_INJECTOR | Freq: Every day | SUBCUTANEOUS | 2 refills | Status: DC

## 2018-10-06 MED ORDER — INSULIN LISPRO 100 UNIT/ML ~~LOC~~ SOLN: 24.0000 [IU] | Freq: Three times a day (TID) | SUBCUTANEOUS | 11 refills | Status: DC

## 2018-10-06 MED ORDER — DULAGLUTIDE 1.5 MG/0.5ML ~~LOC~~ SOAJ: 1.5000 mg | SUBCUTANEOUS | 11 refills | Status: DC

## 2018-10-06 MED FILL — $LANTUS SOLOSTAR 100 UNITS/: 100 | 81 days supply | Qty: 45 | Fill #0

## 2018-10-06 MED FILL — !TRULICITY 1.5 MG/0.5 ML PE: 1.5 | 14 days supply | Qty: 1 | Fill #0

## 2018-10-06 NOTE — Patient Instructions (Signed)
-   Decrease Lantus to 55 units at bedtime - Decrease Humalog to 24 unts with each meal  - Increase trulicity to 1.5 mg every Saturday      HOW TO TREAT LOW BLOOD SUGARS (Blood sugar LESS THAN 70 MG/DL)  Please follow the RULE OF 15 for the treatment of hypoglycemia treatment (when your (blood sugars are less than 70 mg/dL)    STEP 1: Take 15 grams of carbohydrates when your blood sugar is low, which includes:   3-4 GLUCOSE TABS  OR  3-4 OZ OF JUICE OR REGULAR SODA OR  ONE TUBE OF GLUCOSE GEL     STEP 2: RECHECK blood sugar in 15 MINUTES STEP 3: If your blood sugar is still low at the 15 minute recheck --> then, go back to STEP 1 and treat AGAIN with another 15 grams of carbohydrates.    24-Hour Urine Collection   You will be collecting your urine for a 24-hour period of time.  Your timer starts with your first urine of the morning (For example - If you first pee at [place], your timer will start at [place])  [place] away your first urine of the morning  Collect your urine every time you pee for the next 24 hours STOP your urine collection 24 hours after you started the collection (For example - You would stop at 9AM the day after you started)

## 2018-10-06 NOTE — Progress Notes (Signed)
Name: Kimberly Frazier  MRN/ DOB: [id number], Mar 14, 1961   Age/ Sex: 58 y.o., female    PCP: Ladell Pier, MD   Reason for Endocrinology Evaluation: Type 2 Diabetes Mellitus     Date of Initial Endocrinology Visit: 10/06/2018     PATIENT IDENTIFIER: Kimberly Frazier is a 58 y.o. 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 The patient presented for initial endocrinology clinic visit on 10/06/2018 for consultative assistance with her diabetes management.    HPI: [name] was    Diagnosed with T2DM in > 10 yrs ago Prior Medications tried/Intolerance: Metformin - GI intolerance.  Currently checking blood sugars 2 x / day,  before breakfast and bedtime  Hypoglycemia episodes : no             Hemoglobin A1c has ranged from 7.9% in 2014, peaking at 13.2% in 2016. Patient required assistance for hypoglycemia: no Patient has required hospitalization within the last 1 year from hyper or hypoglycemia: no  In terms of diet, the patient stopped sodas 2 weeks ago, she continues to drinks sweet tea. No snacks.     Adrenal Adenoma History : This was initially discovered on abdominal MRI in 12/2017 there is a 1.7 cm left adrenal adenoma and again on a staging CT scan in 07/2018, it was measured at 2.0 cm .   Adrenal incidentaloma: Substantial weight gain:  Stable over the past 7 yrs  Centripetal obesity: yes Easy bruisbility: no Severe hypertension: no DM yes Virilization : hirsutism around the chin, no voice changes.  Proximal muscle weakness : no Fatigue: yes Sudden/ severe headaches: yes Weight loss: no Anxiety attacks : no Sweating: scalp Cardiac arrhythmias: no Palpitations: occasional  Fluid retention: rarely  Hypokalemia: yes     HOME DIABETES REGIMEN: Lantus 68 units QHS Humalog 28 units TIDQAC - skips twice a week  Trulicity 4.82 weekly (Saturday)   Statin: yes ACE-I/ARB: No    METER  DOWNLOAD SUMMARY: Last night 110 mg/dL  This Am 187 mg/dL (ate apple sauce at 3 am )    DIABETIC COMPLICATIONS: Microvascular complications:   Neuropathy  Denies: Variable GFR, retinopathy   Last eye exam: Completed 2018  Macrovascular complications:   CAD (awaiting CABG per pt)  Denies: PVD, CVA   PAST HISTORY: Past Medical History:  Past Medical History:  Diagnosis Date  . Adrenal adenoma, left   . Arthritis   . CKD (chronic kidney disease), stage II   . Coronary artery disease 07-28-2018 [facility]  currently due to no insurance   per cardiac cath 02-06-2015 (positive mild lateral ishcemia on stress test)--- dLAD 80%,  mLAD 40%,  mPDA 80% (small vessel),  ostial D1 70%,  CFx with lumial irregarlities-- medical management  . Depression   . Diabetic neuropathy ([place])   . History of Bell's palsy 07/2011   per pt residual facial pain on left side occasionally  . History of cancer of vagina 1999   per pt completed radiation and chemo  . History of sepsis 12/30/2017   positive blood culter fro E.coli  . Hyperlipidemia   . Hypertension   . Hypokalemia   . Insulin dependent type 2 diabetes mellitus, uncontrolled ([place])    followed by pcp---  A1c was 11.7 on 06-15-2018 in epic  . Nocturia   . Peripheral neuropathy   . PMB (postmenopausal bleeding)   . Wears dentures    upper  .  Wears glasses    Past Surgical History:  Past Surgical History:  Procedure Laterality Date  . BREAST BIOPSY  2012   benign  . CARDIAC CATHETERIZATION N/A 02/06/2015   Procedure: Left Heart Cath and Coronary Angiography;  Surgeon: Larey Dresser, MD;  Location: [facility];  Service: Cardiovascular;  Laterality: N/A;  . HYSTEROSCOPY W/D&C N/A 08/01/2018   Procedure: DILATATION AND CURETTAGE /HYSTEROSCOPY;  Surgeon: Mora Bellman, MD;  Location: [facility];  Service: Gynecology;  Laterality: N/A;  . UMBILICAL HERNIA REPAIR  child       Social History:  reports that she has never smoked. She has never used smokeless tobacco. She reports previous alcohol use. She reports previous drug use. Drug: Marijuana. Family History:  Family History  Problem Relation Age of Onset  . Heart disease Mother   . Hypertension Mother   . Diabetes Mother   . Cancer Father        hodgkins lymphoma  . Heart disease Sister        heart attack  . Diabetes Sister   . Cancer Other        parent  . Diabetes Other        parent  . Heart disease Other        parent  . Hyperlipidemia Other        parent  . Hypertension Other        parent  . Arthritis Other        parent  . Diabetes Maternal Aunt   . Diabetes Maternal Uncle   . Breast cancer Neg Hx      HOME MEDICATIONS: Allergies as of 10/06/2018   No Known Allergies     Medication List       Accurate as of Oct 06, 2018 10:07 AM. If you have any questions, ask your nurse or doctor.        amLODipine 5 MG tablet Commonly known as:  NORVASC Take 1 tablet (5 mg total) by mouth daily.   aspirin EC 81 MG tablet Take 1 tablet (81 mg total) daily by mouth.   atorvastatin 40 MG tablet Commonly known as:  LIPITOR TAKE 1 TABLET DAILY BY MOUTH.   BD Pen Needle Nano U/F 32G X 4 MM Misc Generic drug:  Insulin Pen Needle USE TWICE DAILY AS DIRECTED   Insulin Pen Needle 32G X 4 MM Misc Commonly known as:  Engineer, maintenance Pen Needles Use as directed with insulin   carvedilol 25 MG tablet Commonly known as:  COREG TAKE 1 TABLET (25 MG TOTAL) BY MOUTH 2 (TWO) TIMES DAILY.   Dulaglutide 0.75 MG/0.5ML Sopn Commonly known as:  Trulicity Inject 0.93 mg into the skin once a week. What changed:  additional instructions   gabapentin 300 MG capsule Commonly known as:  NEURONTIN Take 1 capsule (300 mg total) by mouth at bedtime. What changed:    when to take this  reasons to take this   glucose blood test strip Commonly known as:  True Metrix Blood Glucose Test Use as instructed    ibuprofen 600 MG tablet Commonly known as:  ADVIL Take 1 tablet (600 mg total) by mouth every 6 (six) hours as needed.   Insulin Glargine 100 UNIT/ML Solostar Pen Commonly known as:  Lantus SoloStar Inject 65 Units into the skin daily.   insulin lispro 100 UNIT/ML injection Commonly known as:  HumaLOG INJECT 28 UNITS INTO THE SKIN THREE TIMES DAILY BEFORE MEALS.  isosorbide mononitrate 60 MG 24 hr tablet Commonly known as:  IMDUR Take 1 tablet (60 mg total) by mouth daily.   nitroGLYCERIN 0.4 MG SL tablet Commonly known as:  NITROSTAT Place 1 tab under tongue for chest pain.  May repeat after 5 minutes x 2.  DO NOT TAKE MORE THAN 3 TABS DURING AN EPISODE OF CHEST PAIN   potassium chloride 8 MEQ tablet Commonly known as:  KLOR-CON Take 2 tablets (16 mEq total) by mouth daily. What changed:    how much to take  when to take this   traMADol 50 MG tablet Commonly known as:  ULTRAM Take 1 tablet (50 mg total) by mouth every 6 (six) hours as needed for severe pain. For AFTER surgery, do not take and drive   True Metrix Meter w/Device Kit Use as directed   TRUEplus Lancets 28G Misc Use as directted        ALLERGIES: No Known Allergies   REVIEW OF SYSTEMS: A comprehensive ROS was conducted with the patient and is negative except as per HPI and below:  Review of Systems  Constitutional: Negative for fever and malaise/fatigue.  HENT: Negative for congestion and sore throat.   Eyes: Positive for blurred vision. Negative for pain.  Respiratory: Negative for cough and shortness of breath.   Cardiovascular: Negative for chest pain and palpitations.  Gastrointestinal: Negative for diarrhea and nausea.  Genitourinary: Positive for frequency.  Skin: Negative.   Neurological: Positive for tingling. Negative for tremors.  Endo/Heme/Allergies: Positive for polydipsia.  Psychiatric/Behavioral: Positive for depression. The patient is not nervous/anxious.        OBJECTIVE:   VITAL SIGNS: BP 124/78 (BP Location: Right Wrist, Patient Position: Sitting, Cuff Size: Normal)   Pulse 85   Temp 98.1 F (36.7 C) (Oral)   Ht 5' 4.37" (1.635 m)   Wt 278 lb 3.2 oz (126.2 kg)   SpO2 96%   BMI 47.21 kg/m    PHYSICAL EXAM:  General: Pt appears well and is in NAD  Hydration: Well-hydrated with moist mucous membranes and good skin turgor  HEENT: Head: Unremarkable with good dentition. Oropharynx clear without exudate.  Eyes: External eye exam normal without stare, lid lag or exophthalmos.  EOM intact.  PERRL.  Neck: General: Supple without adenopathy or carotid bruits. Thyroid: Thyroid size normal.  No goiter or nodules appreciated. No thyroid bruit.  Lungs: Clear with good BS bilat with no rales, rhonchi, or wheezes  Heart: RRR with normal S1 and S2 and no gallops; no murmurs; no rub  Abdomen: Normoactive bowel sounds, soft, nontender, without masses or organomegaly palpable  Extremities:  Lower extremities - No pretibial edema. No lesions.  Skin: Normal texture and temperature to palpation. No rash noted. No Acanthosis nigricans/skin tags.lower  lipohypertrophy.  Neuro: MS is good with appropriate affect, pt is alert and Ox3    DM foot exam: deferred    DATA REVIEWED:  Lab Results  Component Value Date   HGBA1C 10.0 (H) 08/11/2018   HGBA1C 11.7 (A) 06/15/2018   HGBA1C 11.8 (A) 01/09/2018   Lab Results  Component Value Date   MICROALBUR 1.5 07/15/2015   LDLCALC 68 09/20/2018   CREATININE 1.06 (H) 08/11/2018   Lab Results  Component Value Date   MICRALBCREAT 1.4 07/15/2015    Lab Results  Component Value Date   CHOL 131 09/20/2018   HDL 38 (L) 09/20/2018   LDLCALC 68 09/20/2018   LDLDIRECT 93.0 07/15/2015   TRIG 123 09/20/2018  CHOLHDL 3.4 09/20/2018        ASSESSMENT / PLAN / RECOMMENDATIONS:   1) Type 2 Diabetes Mellitus, Poorly controlled, With neuropathic and macrovascular complications - Most recent A1c of 10.0 %. Goal A1c  < 7.0%.   Plan: GENERAL: I have discussed with the patient the pathophysiology of diabetes. We went over the natural progression of the disease. We talked about both insulin resistance and insulin deficiency. We stressed the importance of lifestyle changes including diet and exercise. I explained the complications associated with diabetes including retinopathy, nephropathy, neuropathy as well as increased risk of cardiovascular disease. We went over the benefit seen with glycemic control.   I explained to the patient that diabetic patients are at higher than normal risk for amputations. The patient was informed that diabetes is the number one cause of non-traumatic amputations in [place]. Discussed pharmacokinetics of basal/bolus insulin and the importance of taking prandial insulin with meals.   We also discussed avoiding sugar-sweetened beverages and snacks, when possible.      MEDICATIONS:  Increase Trulicity 1.5 mg weekly (Saturday )  Decrease Lantus to 55 units daily   Decrease Humalog to 24 units TIDQAC  EDUCATION / INSTRUCTIONS:  BG monitoring instructions: Patient is instructed to check her blood sugars 4 times a day, before meals and bedtime.  Call [facility] if: BG persistently < 70 or > 300. . I reviewed the Rule of 15 for the treatment of hypoglycemia in detail with the patient. Literature supplied.   2) Diabetic complications:   Eye: Unknown to  have diabetic retinopathy. Pt urged to schedule an appointment .  Neuro/ Feet: Does  have known diabetic peripheral neuropathy.  Renal: Her renal function has been fluctuating. She is not on an ACEI/ARB at present.Check urine albumin/creatinine ratio yearly starting at time of diagnosis. If albuminuria is positive, treatment is geared toward better glucose, blood pressure control and use of ACE inhibitors or ARBs. Monitor electrolytes and creatinine once to twice yearly.   3) Lipids: Patient is on a statin.  LDL at goal .   4) Hypertension: She is  at goal of < 140/90 mmHg.   5) Left Adrenal Adenoma :  - 85 % of adrenal incidentaloma's are benign. Despite having an MRI in 2019 with a left adrenal adenoma size of 1.7 cm and a CT scan in 2020 at 2.0 cm , I am not sure I could say for sure that there's an increase in size, given 2 different imaging modalities  - My recommendations is to repeat CT with adrenal protocol in 03/2019 - Will proceed with hormonal check up as below   - TESTING: 1. 24-hr urine collection for cortisol to screen for cushing  2. Aldo: renin ratio to screen for hyperaldosteronism 3. Plasma free metanephrine to screen for pheochromocytoma.    6) Uterine Cancer :  - Awaiting surgery   F/u in 8 weeks   Signed electronically by: Mack Guise, MD  [facility]  [facility] [address] [place], [place] [address] Phone: [phone] FAX: [phone]   CC: Ladell Pier, MD [place] [address] Phone: [phone]  Fax: [phone]    Return to Endocrinology clinic as below: Future Appointments  Date Time Provider [place]  11/28/2018  1:20 PM Buford Dresser, MD [facility]

## 2018-10-09 ENCOUNTER — Telehealth: Payer: Self-pay | Admitting: Internal Medicine

## 2018-10-09 ENCOUNTER — Other Ambulatory Visit: Payer: Self-pay

## 2018-10-09 DIAGNOSIS — D3502 Benign neoplasm of left adrenal gland: Secondary | ICD-10-CM

## 2018-10-09 MED ORDER — POTASSIUM CHLORIDE ER 10 MEQ PO TBCR: 20.0000 meq | EXTENDED_RELEASE_TABLET | Freq: Every day | ORAL | 1 refills | Status: DC

## 2018-10-09 MED FILL — ?CARVEDILOL 25 MG TABLET: 25 | 30 days supply | Qty: 60 | Fill #1

## 2018-10-09 MED FILL — ?POTASSIUM CL ER 10 MEQ TAB: 10 MEQ | 30 days supply | Qty: 60 | Fill #0

## 2018-10-09 MED FILL — ISOSORBIDE MN ER 60 MG TAB: 60 | 30 days supply | Qty: 30 | Fill #1

## 2018-10-09 NOTE — Telephone Encounter (Signed)
Attempted to call the patient on 10/09/2018 with no answer.   Left a message to check the portal.    Potassium is low, a new prescription of KCL will be sent  Will cancel Aldo:renin due to hypokalemia  She was instructed to contact the office in 2 weeks for repeat labs.     Abby Nena Jordan, MD  [facility]  [facility] [address] [place], [place] [address] Phone: [phone] FAX: [phone]

## 2018-10-11 ENCOUNTER — Other Ambulatory Visit: Payer: Self-pay

## 2018-10-11 ENCOUNTER — Telehealth: Payer: Self-pay | Admitting: Internal Medicine

## 2018-10-11 LAB — ALDOSTERONE + RENIN ACTIVITY W/ RATIO
ALDO / PRA Ratio: 133.3 Ratio — ABNORMAL HIGH (ref 0.9–28.9)
Aldosterone: 8 ng/dL
Renin Activity: 0.06 ng/mL/h — ABNORMAL LOW (ref 0.25–5.82)

## 2018-10-11 LAB — METANEPHRINES, PLASMA
Metanephrine, Free: <25 pg/mL (ref <=57)
Normetanephrine, Free: 102 pg/mL (ref <=148)
Total Metanephrines-Plasma: 102 pg/mL (ref <=205)

## 2018-10-11 NOTE — Telephone Encounter (Signed)
Discussed abnormal lab results with the patient to include hypokalemia, suppressed renin.    Unfortunately the Aldo: renin ratio was supposed to be canceled , because there's no point in checking aldo:renin ratio with a low potassium, as it will make her aldosterone levels falsely low.   The pt will be credited to her account.    Despite having a low aldo, her renin is suppressed, and I have a HIGH suspicion of hyperaldosteronism in this pt.   Recommendations   Change to K-Dur 20 meq daily Recheck in 2 weeks    Pt advised to contact the office to schedule a lab appointment in 2 weeks for repeat testing.     Abby Nena Jordan, MD  [facility]  [facility] [address] [place], [place] [address] Phone: [phone] FAX: [phone]

## 2018-10-12 LAB — CORTISOL, URINE, 24 HOUR
24 Hour urine volume (VMAHVA): 2200 mL
Cortisol (Ur), Free: 30.5 mcg/24 h (ref 4.0–50.0)
RESULTS RECEIVED: 1.22 g/(24.h) (ref 0.50–2.15)

## 2018-10-12 LAB — EXTRA URINE SPECIMEN

## 2018-10-18 ENCOUNTER — Other Ambulatory Visit: Payer: Self-pay

## 2018-10-18 ENCOUNTER — Telehealth: Payer: Self-pay | Admitting: Oncology

## 2018-10-18 NOTE — Telephone Encounter (Signed)
Kimberly Frazier and scheduled appointment to see Dr. Denman George on 10/20/18 at 12:15.  She verbalized understanding and agreement.

## 2018-10-20 ENCOUNTER — Encounter: Payer: Self-pay | Admitting: Gynecologic Oncology

## 2018-10-20 ENCOUNTER — Inpatient Hospital Stay: Payer: Medicaid Other | Attending: Gynecologic Oncology | Admitting: Gynecologic Oncology

## 2018-10-20 ENCOUNTER — Other Ambulatory Visit: Payer: Self-pay

## 2018-10-20 ENCOUNTER — Other Ambulatory Visit: Payer: Self-pay | Admitting: Gynecologic Oncology

## 2018-10-20 VITALS — BP 133/84 | HR 88 | Temp 98.3°F | Resp 18 | Ht 64.0 in | Wt 282.0 lb

## 2018-10-20 DIAGNOSIS — I129 Hypertensive chronic kidney disease with stage 1 through stage 4 chronic kidney disease, or unspecified chronic kidney disease: Secondary | ICD-10-CM | POA: Insufficient documentation

## 2018-10-20 DIAGNOSIS — Z7982 Long term (current) use of aspirin: Secondary | ICD-10-CM | POA: Diagnosis not present

## 2018-10-20 DIAGNOSIS — E1122 Type 2 diabetes mellitus with diabetic chronic kidney disease: Secondary | ICD-10-CM | POA: Diagnosis not present

## 2018-10-20 DIAGNOSIS — E7800 Pure hypercholesterolemia, unspecified: Secondary | ICD-10-CM | POA: Insufficient documentation

## 2018-10-20 DIAGNOSIS — I2510 Atherosclerotic heart disease of native coronary artery without angina pectoris: Secondary | ICD-10-CM | POA: Diagnosis not present

## 2018-10-20 DIAGNOSIS — I252 Old myocardial infarction: Secondary | ICD-10-CM | POA: Diagnosis not present

## 2018-10-20 DIAGNOSIS — C541 Malignant neoplasm of endometrium: Secondary | ICD-10-CM | POA: Diagnosis present

## 2018-10-20 DIAGNOSIS — E1165 Type 2 diabetes mellitus with hyperglycemia: Secondary | ICD-10-CM | POA: Insufficient documentation

## 2018-10-20 MED ORDER — SENNOSIDES-DOCUSATE SODIUM 8.6-50 MG PO TABS: 2.0000 | ORAL_TABLET | Freq: Every day | ORAL | 1 refills | Status: DC

## 2018-10-20 NOTE — Progress Notes (Signed)
Follow-up Note: Gyn-Onc  Consult was initially requested by Dr. Elly Modena for the evaluation of Kimberly Frazier 58 y.o. female  CC:  Chief Complaint  Patient presents with  . Endometrial carcinoma [facility])    Assessment/Plan:  Kimberly Frazier  is a 58 y.o.  year old with high grade endometrial cancer in the setting of morbid obesity (BMI >40), very poorly controlled DM, CAD and a history of prior gyn cancer (cervical vs vaginal) treated with radiation.  She desires to proceed with robotic assisted total hysterectomy with bilateral sapingo-oophorectomy and SLN biopsy.   She will be seen by anesthesia for preoperative clearance and discussion of postoperative pain management.  She was given the opportunity to ask questions, which were answered to her satisfaction, and she is agreement with the above mentioned plan of care.  She has optimized her blood glucose as much as possible, preop but her poorly controlled diabetes continues to place her at increased risk for perioperative complications.   It is reasonable for her to continue aspirin '81mg'$  perioperatively though this does increase risk for postop bleeding.  HPI: [name] is a 58 year old P2 who is seen in consultation at the request of Dr Elly Modena for grade 3 endometrial cancer.  The patient reports a history of light vaginal spotting for approximately 1 year.  Since January 2020 she developed heavy vaginal bleeding and this prompted her to see Dr. Elly Modena who performed a transvaginal ultrasound on June 20, 2018.  This revealed a uterus measuring 7.6 x 3.1 x 4.9 cm, there was a hypoechoic masslike area in the cervix measuring 1.6 x 1.4 x 1.9 cm.  The endometrium was thickened at 14 mm.  It contained an endometrial mass measuring 2.6 cm in greatest dimension.  She was taken to the operating room on August 01, 2018 with Dr. Elly Modena.  She performed the hysteroscopy D&C.  Intraoperative findings were significant for an 8-week size uterus,  diffuse proliferative endometrium, but no mention of an endometrial mass.  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  Of note the patient has a history of a Pap smear in January 2020 that was normal with no high risk HPV detected.  The patient's medical history is complex.  She has a remote history of a gynecologic malignancy in 1999.  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  She describes treatment with primary brachii therapy (she describes being in bed in the hospital for 5 days with radiation internally).  She is unsure if she received chemotherapy.  She did not receive surgery for this.  She received follow-up for approximately a year after treatment and then discontinued.  Her medical history is also significant for coronary artery disease.  The patient had what she describes as a light myocardial infarction in 2016.  This was managed with a coronary angiography, however no stent was placed.  She did not receive ongoing cardiology evaluation as the patient reports that she did not have health insurance.  She was initially treated with medical therapy.  Her only medical therapy at the time of this diagnosis with aspirin 81 mg daily.  Patient has a history of type 2 diabetes mellitus for which she takes insulin.  She reports poor blood glucose control.  She reports not particularly being compliant with her diet.  She has sequelae of diabetic neuropathy in hands and feet and chronic kidney disease.  She has hypertension and hypercholesterolemia.  The  patient is morbidly obese with a BMI of 48 kg/m.  Her prior abdominal surgery includes 1 prior cesarean section and, as a child, a midline laparotomy for a hernia repair.  Her family history is significant for father who had a diagnosis of chins lymphoma, and a brother had a history of colon cancer.  Interval Hx:  Her CA 125 was elevated to  12% in March, 2020.  She was seen by cardiology who felt that she was moderate/high risk for perioperative cardiac events but that she was maximally optimized for a hysterectomy and did not advocate for CABG beforehand.  CT abd/pelvis on 08/17/18 showed no apparent extrauterine disease. Small ground glass opacities were seen in the lower lungs -recommended 6 month follow-up.  She has been optimizing her blood glucose control.   Current Meds:  Outpatient Encounter Medications as of 10/20/2018  Medication Sig  . amLODipine (NORVASC) 5 MG tablet Take 1 tablet (5 mg total) by mouth daily.  Marland Kitchen aspirin EC 81 MG tablet Take 1 tablet (81 mg total) daily by mouth.  Marland Kitchen atorvastatin (LIPITOR) 40 MG tablet TAKE 1 TABLET DAILY BY MOUTH.  . BD PEN NEEDLE NANO U/F 32G X 4 MM MISC USE TWICE DAILY AS DIRECTED  . Blood Glucose Monitoring Suppl (TRUE METRIX METER) w/Device KIT Use as directed  . carvedilol (COREG) 25 MG tablet TAKE 1 TABLET (25 MG TOTAL) BY MOUTH 2 (TWO) TIMES DAILY.  . Dulaglutide (TRULICITY) 1.5 UX/3.2GM SOPN Inject 1.5 mg into the skin once a week.  . gabapentin (NEURONTIN) 300 MG capsule Take 1 capsule (300 mg total) by mouth at bedtime. (Patient taking differently: Take 300 mg by mouth as needed. )  . glucose blood (TRUE METRIX BLOOD GLUCOSE TEST) test strip Use as instructed  . ibuprofen (ADVIL,MOTRIN) 600 MG tablet Take 1 tablet (600 mg total) by mouth every 6 (six) hours as needed.  . Insulin Glargine (LANTUS SOLOSTAR) 100 UNIT/ML Solostar Pen Inject 55 Units into the skin daily.  . insulin lispro (HUMALOG) 100 UNIT/ML injection Inject 0.24 mLs (24 Units total) into the skin 3 (three) times daily with meals. INJECT 28 UNITS INTO THE SKIN THREE TIMES DAILY BEFORE MEALS.  Marland Kitchen Insulin Pen Needle (ULTICARE MICRO PEN NEEDLES) 32G X 4 MM MISC Use as directed with insulin  . isosorbide mononitrate (IMDUR) 60 MG 24 hr tablet Take 1 tablet (60 mg total) by mouth daily.  . nitroGLYCERIN (NITROSTAT) 0.4  MG SL tablet Place 1 tab under tongue for chest pain.  May repeat after 5 minutes x 2.  DO NOT TAKE MORE THAN 3 TABS DURING AN EPISODE OF CHEST PAIN  . potassium chloride (K-DUR) 10 MEQ tablet Take 2 tablets (20 mEq total) by mouth daily.  . traMADol (ULTRAM) 50 MG tablet Take 1 tablet (50 mg total) by mouth every 6 (six) hours as needed for severe pain. For AFTER surgery, do not take and drive  . TRUEPLUS LANCETS 28G MISC Use as directted   No facility-administered encounter medications on file as of 10/20/2018.     Allergy: No Known Allergies  Social Hx:   Social History   Socioeconomic History  . Marital status: Single    Spouse name: Not on file  . Number of children: 2  . Years of education: 65  . Highest education level: Not on file  Occupational History  . Occupation: retired  Scientific laboratory technician  . Financial resource strain: Not on file  . Food insecurity:    Worry: Often  true    Inability: Sometimes true  . Transportation needs:    Medical: No    Non-medical: No  Tobacco Use  . Smoking status: Never Smoker  . Smokeless tobacco: Never Used  Substance and Sexual Activity  . Alcohol use: Not Currently  . Drug use: Not Currently    Types: Marijuana    Comment: 07-28-2018  per pt last smoked 12/ 2019 // occassioanl use last used 08/2018  . Sexual activity: Yes  Lifestyle  . Physical activity:    Days per week: 0 days    Minutes per session: 0 min  . Stress: Only a little  Relationships  . Social connections:    Talks on phone: More than three times a week    Gets together: More than three times a week    Attends religious service: Never    Active member of club or organization: No    Attends meetings of clubs or organizations: Never    Relationship status: Living with partner  . Intimate partner violence:    Fear of current or ex partner: No    Emotionally abused: No    Physically abused: No    Forced sexual activity: No  Other Topics Concern  . Not on file   Social History Narrative   Regular exercise-no   No caffeine use    Past Surgical Hx:  Past Surgical History:  Procedure Laterality Date  . BREAST BIOPSY  2012   benign  . CARDIAC CATHETERIZATION N/A 02/06/2015   Procedure: Left Heart Cath and Coronary Angiography;  Surgeon: Larey Dresser, MD;  Location: [facility];  Service: Cardiovascular;  Laterality: N/A;  . HYSTEROSCOPY W/D&C N/A 08/01/2018   Procedure: DILATATION AND CURETTAGE /HYSTEROSCOPY;  Surgeon: Mora Bellman, MD;  Location: [facility];  Service: Gynecology;  Laterality: N/A;  . UMBILICAL HERNIA REPAIR  child    Past Medical Hx:  Past Medical History:  Diagnosis Date  . Adrenal adenoma, left   . Arthritis   . CKD (chronic kidney disease), stage II   . Coronary artery disease 07-28-2018 [facility])  currently due to no insurance   per cardiac cath 02-06-2015 (positive mild lateral ishcemia on stress test)--- dLAD 80%,  mLAD 40%,  mPDA 80% (small vessel),  ostial D1 70%,  CFx with lumial irregarlities-- medical management  . Depression   . Diabetic neuropathy ([place])   . History of Bell's palsy 07/2011   per pt residual facial pain on left side occasionally  . History of cancer of vagina 1999   per pt completed radiation and chemo  . History of sepsis 12/30/2017   positive blood culter fro E.coli  . Hyperlipidemia   . Hypertension   . Hypokalemia   . Insulin dependent type 2 diabetes mellitus, uncontrolled ([place])    followed by pcp---  A1c was 11.7 on 06-15-2018 in epic  . Nocturia   . Peripheral neuropathy   . PMB (postmenopausal bleeding)   . Wears dentures    upper  . Wears glasses     Past Gynecological History:  See HPI No LMP recorded. Patient is postmenopausal.  Family Hx:  Family History  Problem Relation Age of Onset  . Heart disease Mother   . Hypertension Mother   . Diabetes Mother   . Cancer Father        hodgkins lymphoma  . Heart disease  Sister        heart attack  .  Diabetes Sister   . Cancer Other        parent  . Diabetes Other        parent  . Heart disease Other        parent  . Hyperlipidemia Other        parent  . Hypertension Other        parent  . Arthritis Other        parent  . Diabetes Maternal Aunt   . Diabetes Maternal Uncle   . Breast cancer Neg Hx     Review of Systems:  Constitutional  Feels well,    ENT Normal appearing ears and nares bilaterally Skin/Breast  No rash, sores, jaundice, itching, dryness Cardiovascular  No chest pain, shortness of breath, or edema  Pulmonary  No cough or wheeze.  Gastro Intestinal  No nausea, vomitting, or diarrhoea. No bright red blood per rectum, no abdominal pain, change in bowel movement, or constipation.  Genito Urinary  No frequency, urgency, dysuria, + postmenopausal bleeding Musculo Skeletal  No myalgia, arthralgia, joint swelling or pain  Neurologic  No weakness, numbness, change in gait,  Psychology  No depression, anxiety, insomnia.   Vitals:  Blood pressure 133/84, pulse 88, temperature 98.3 F (36.8 C), temperature source Oral, resp. rate 18, height _0  (1.626 m), weight 282 lb (127.9 kg), SpO2 99 %.  Physical Exam: WD in NAD Neck  Supple NROM, without any enlargements.  Lymph Node Survey No cervical supraclavicular or inguinal adenopathy Cardiovascular  Pulse normal rate, regularity and rhythm. S1 and S2 normal.  Lungs  Clear to auscultation bilateraly, without wheezes/crackles/rhonchi. Good air movement.  Skin  No rash/lesions/breakdown  Psychiatry  Alert and oriented to person, place, and time  Abdomen  Normoactive bowel sounds, abdomen soft, non-tender and obese without evidence of hernia. Back No CVA tenderness Genito Urinary  Vulva/vagina: Normal external female genitalia.  No lesions. No discharge or bleeding.  Bladder/urethra:  No lesions or masses, well supported bladder  Vagina: normal  Cervix: Pale  (consistent with prior radiation), no lesions.  Uterus: Small, mobile, no parametrial involvement or nodularity.  Adnexa: no palpable masses. Rectal  deferred Extremities  No bilateral cyanosis, clubbing or edema.   Thereasa Solo, MD  10/20/2018, 12:56 PM

## 2018-10-20 NOTE — Patient Instructions (Addendum)
Preparing for your Surgery  Plan for surgery on November 28, 2018 with Dr. Everitt Amber at [facility] will be scheduled for a robotic assisted total laparoscopic hysterectomy, bilateral salpingo-oophorectomy, sentinel lymph node biopsy.   Pre-operative Testing -You will receive a phone call from presurgical testing at [facility] if you have not received a call already to arrange for a pre-operative testing appointment before your surgery.  This appointment normally occurs one to two weeks before your scheduled surgery.   -Bring your insurance card, copy of an advanced directive if applicable, medication list  -At that visit, you will be asked to sign a consent for a possible blood transfusion in case a transfusion becomes necessary during surgery.  The need for a blood transfusion is rare but having consent is a necessary part of your care.     -CONTINUE TAKING YOUR ASPIRIN.  -Do not take supplements such as fish oil (omega 3), red yeast rice, tumeric before your surgery.  Day Before Surgery at [facility] will be asked to take in a light diet the day before surgery.  Avoid carbonated beverages.  You will be advised to have nothing to eat or drink after midnight the evening before.    Eat a light diet the day before surgery.  Examples including soups, broths, toast, yogurt, mashed potatoes.  Things to avoid include carbonated beverages (fizzy beverages), raw fruits and raw vegetables, or beans.   If your bowels are filled with gas, your surgeon will have difficulty visualizing your pelvic organs which increases your surgical risks.  Your role in recovery Your role is to become active as soon as directed by your doctor, while still giving yourself time to heal.  Rest when you feel tired. You will be asked to do the following in order to speed your recovery:  - Cough and breathe deeply. This helps toclear and expand your lungs and can prevent pneumonia. You may be given a  spirometer to practice deep breathing. A staff member will show you how to use the spirometer. - Do mild physical activity. Walking or moving your legs help your circulation and body functions return to normal. A staff member will help you when you try to walk and will provide you with simple exercises. Do not try to get up or walk alone the first time. - Actively manage your pain. Managing your pain lets you move in comfort. We will ask you to rate your pain on a scale of zero to 10. It is your responsibility to tell your doctor or nurse where and how much you hurt so your pain can be treated.  Special Considerations -If you are diabetic, you may be placed on insulin after surgery to have closer control over your blood sugars to promote healing and recovery.  This does not mean that you will be discharged on insulin.  If applicable, your oral antidiabetics will be resumed when you are tolerating a solid diet.  -Your final pathology results from surgery should be available around one week after surgery and the results will be relayed to you when available.  -Dr. Lahoma Cro[place] is the surgeon that assists your GYN Oncologist with surgery.  If you end up staying the night, the next day after your surgery you will either see Dr. Denman George or Dr. Lahoma Cro[place].  -FMLA forms can be faxed to [phone] and please allow 5-7 business days for completion.  Pain Management After Surgery -You have been prescribed your pain medication  and bowel regimen medications before surgery so that you can have these available when you are discharged from the hospital. The pain medication is for use ONLY AFTER surgery and a new prescription will not be given.   -Make sure that you have Tylenol and Ibuprofen at home to use on a regular basis after surgery for pain control. We recommend alternating the medications every hour to six hours since they work differently and are processed in the body differently for pain  relief.  -Review the attached handout on narcotic use and their risks and side effects.   Bowel Regimen -You have been prescribed Sennakot-S to take nightly to prevent constipation especially if you are taking the narcotic pain medication intermittently.  It is important to prevent constipation and drink adequate amounts of liquids.  Blood Transfusion Information WHAT IS A BLOOD TRANSFUSION? A transfusion is the replacement of blood or some of its parts. Blood is made up of multiple cells which provide different functions.  Red blood cells carry oxygen and are used for blood loss replacement.  White blood cells fight against infection.  Platelets control bleeding.  Plasma helps clot blood.  Other blood products are available for specialized needs, such as hemophilia or other clotting disorders. BEFORE THE TRANSFUSION  Who gives blood for transfusions?   You may be able to donate blood to be used at a later date on yourself (autologous donation).  Relatives can be asked to donate blood. This is generally not any safer than if you have received blood from a stranger. The same precautions are taken to ensure safety when a relative's blood is donated.  Healthy volunteers who are fully evaluated to make sure their blood is safe. This is blood bank blood. Transfusion therapy is the safest it has ever been in the practice of medicine. Before blood is taken from a donor, a complete history is taken to make sure that person has no history of diseases nor engages in risky social behavior (examples are intravenous drug use or sexual activity with multiple partners). The donor's travel history is screened to minimize risk of transmitting infections, such as malaria. The donated blood is tested for signs of infectious diseases, such as HIV and hepatitis. The blood is then tested to be sure it is compatible with you in order to minimize the chance of a transfusion reaction. If you or a relative donates  blood, this is often done in anticipation of surgery and is not appropriate for emergency situations. It takes many days to process the donated blood. RISKS AND COMPLICATIONS Although transfusion therapy is very safe and saves many lives, the main dangers of transfusion include:   Getting an infectious disease.  Developing a transfusion reaction. This is an allergic reaction to something in the blood you were given. Every precaution is taken to prevent this. The decision to have a blood transfusion has been considered carefully by your caregiver before blood is given. Blood is not given unless the benefits outweigh the risks.  AFTER SURGERY CONSIDERATIONS  Return to work: 4-6 weeks if applicable  Activity: 1. Be up and out of the bed during the day.  Take a nap if needed.  You may walk up steps but be careful and use the hand rail.  Stair climbing will tire you more than you think, you may need to stop part way and rest.   2. No lifting or straining for 6 weeks.  3. No driving for 1 week(s).  Do not drive  if you are taking narcotic pain medicine.  4. Shower daily.  Use soap and water on your incision and pat dry; don't rub.  No tub baths until cleared by your surgeon.   5. No sexual activity and nothing in the vagina for 8 weeks.  6. You may experience a small amount of clear drainage from your incisions, which is normal.  If the drainage persists or increases, please call the office.  7. You may experience vaginal spotting after surgery or around the 6-8 week mark from surgery when the stitches at the top of the vagina begin to dissolve.  The spotting is normal but if you experience heavy bleeding, call our office.  8. Take Tylenol or ibuprofen first for pain and only use narcotic pain medication for severe pain not relieved by the Tylenol or Ibuprofen.  Monitor your Tylenol intake to a max of 4,000 mg a day.  Diet: 1. Low sodium Heart Healthy Diet is recommended.  2. It is safe to  use a laxative, such as Miralax or Colace, if you have difficulty moving your bowels. You can take Sennakot at bedtime every evening to keep bowel movements regular and to prevent constipation.    Wound Care: 1. Keep clean and dry.  Shower daily.  Reasons to call the Doctor:  Fever - Oral temperature greater than 100.4 degrees Fahrenheit  Foul-smelling vaginal discharge  Difficulty urinating  Nausea and vomiting  Increased pain at the site of the incision that is unrelieved with pain medicine.  Difficulty breathing with or without chest pain  New calf pain especially if only on one side  Sudden, continuing increased vaginal bleeding with or without clots.

## 2018-10-24 MED FILL — !TRULICITY 1.5 MG/0.5 ML PE: 1.5 | 14 days supply | Qty: 1 | Fill #1

## 2018-10-24 MED FILL — ?ATORVASTATIN 40MG TABLET: 40 | 30 days supply | Qty: 30 | Fill #1

## 2018-10-31 ENCOUNTER — Other Ambulatory Visit: Payer: Self-pay

## 2018-10-31 ENCOUNTER — Other Ambulatory Visit (INDEPENDENT_AMBULATORY_CARE_PROVIDER_SITE_OTHER): Payer: Self-pay

## 2018-10-31 DIAGNOSIS — D3502 Benign neoplasm of left adrenal gland: Secondary | ICD-10-CM

## 2018-10-31 LAB — POTASSIUM: Potassium: 3 mEq/L — ABNORMAL LOW (ref 3.5–5.1)

## 2018-11-01 ENCOUNTER — Telehealth: Payer: Self-pay | Admitting: Internal Medicine

## 2018-11-01 DIAGNOSIS — D3502 Benign neoplasm of left adrenal gland: Secondary | ICD-10-CM

## 2018-11-01 MED ORDER — POTASSIUM CHLORIDE ER 10 MEQ PO TBCR: 20.0000 meq | EXTENDED_RELEASE_TABLET | Freq: Two times a day (BID) | ORAL | 1 refills | Status: DC

## 2018-11-01 MED FILL — ?POTASSIUM CL ER 10 MEQ TAB: 10 MEQ | 120 days supply | Qty: 120 | Fill #0

## 2018-11-01 NOTE — Telephone Encounter (Signed)
Discussed persistent hypokalemia despite taking 20 mEq of potassium daily.  Unable to test for aldo:renin ratio  At this time until K+ is normal.    Will increase Kcl as below    2 tablets(20 mEq)  BID   Pt to have labs in 2 weeks.    Pt expressed understanding.     Abby Nena Jordan, MD  [facility]  [facility] [address] [place], [place] [address] Phone: [phone] FAX: [phone]

## 2018-11-07 LAB — ALDOSTERONE + RENIN ACTIVITY W/O RATIO
ALDOSTERONE: 14 ng/dL (ref 0.0–30.0)
Renin: <0.167 ng/mL/hr — ABNORMAL LOW (ref 0.167–5.380)

## 2018-11-14 ENCOUNTER — Other Ambulatory Visit: Payer: Self-pay

## 2018-11-23 NOTE — Progress Notes (Signed)
11/10/2018- noted in Epic-CT Coronary Fractional Flow Reserve Fluid Analysis  09/18/2018-noted in Epic-ECHO  09/14/2018- noted in Epic- CT Coronary Morph w/CTA core w/score w/CA  08/17/2018- noted in Epic-CT chest w/contrast  12/31/2017- noted in Epic-EKG

## 2018-11-23 NOTE — Patient Instructions (Addendum)
Onda Kattner   Your procedure is scheduled on: Tuesday 11/28/2018   Report to [facility] Main  Entrance Report to admitting at  08:00  AM               YOU NEED TO HAVE A COVID 19 TEST ON_6/26______ @_11 :20______, THIS TEST MUST BE DONE BEFORE SURGERY, COME TO [place].             ONCE YOUR COVID TEST IS COMPLETED, PLEASE BEGIN THE QUARANTINE INSTRUCTIONS AS OUTLINED IN YOUR HANDOUT.    Call this number if you have problems the morning of surgery [phone]   How to Manage Your Diabetes Before and After Surgery  Why is it important to control my blood sugar before and after surgery? . Improving blood sugar levels before and after surgery helps healing and can limit problems. . A way of improving blood sugar control is eating a healthy diet by: o  Eating less sugar and carbohydrates o  Increasing activity/exercise o  Talking with your doctor about reaching your blood sugar goals . High blood sugars (greater than 180 mg/dL) can raise your risk of infections and slow your recovery, so you will need to focus on controlling your diabetes during the weeks before surgery. . Make sure that the doctor who takes care of your diabetes knows about your planned surgery including the date and location.  How do I manage my blood sugar before surgery? . Check your blood sugar at least 4 times a day, starting 2 days before surgery, to make sure that the level is not too high or low. o Check your blood sugar the morning of your surgery when you wake up and every 2 hours until you get to the Short Stay unit. . If your blood sugar is less than 70 mg/dL, you will need to treat for low blood sugar: o Do not take insulin. o Treat a low blood sugar (less than 70 mg/dL) with  cup of clear juice (cranberry or apple), 4 glucose tablets, OR glucose gel. o Recheck blood sugar in 15 minutes after treatment (to make sure it is greater than 70  mg/dL). If your blood sugar is not greater than 70 mg/dL on recheck, call [phone] for further instructions. . Report your blood sugar to the short stay nurse when you get to Short Stay.  . If you are admitted to the hospital after surgery: o Your blood sugar will be checked by the staff and you will probably be given insulin after surgery (instead of oral diabetes medicines) to make sure you have good blood sugar levels. o The goal for blood sugar control after surgery is 80-180 mg/dL.   WHAT DO I DO ABOUT MY DIABETES MEDICATION?  Marland Kitchen Do not take oral diabetes medicines (pills) the morning of surgery.  . THE NIGHT BEFORE SURGERY, take  25   units of  Lantus     insulin.            The night before surgery, DO NOT TAKE EVENING DOSE OF Insulin Lispro (Humalog)!   Marland Kitchen The day of surgery, do not take other diabetes injectables, including Byetta (exenatide), Bydureon (exenatide ER), Victoza (liraglutide), or Trulicity (dulaglutide)         The morning of surgery,  follow the instructions below for taking the Insulin Lispro (Humalog)!  . If your CBG is greater than  220 mg/dL, you may take  of your sliding scale  .  . (correction) dose of insulin.      Eat a light diet the day before surgery.  Examples including soups, broths, toast, yogurt, mashed potatoes.  Things to avoid include carbonated beverages (fizzy beverages), raw fruits and raw vegetables, or beans.   If your bowels are filled with gas, your surgeon will have difficulty visualizing your pelvic organs which increases your surgical risks.  Do not eat food After Midnight.  YOU MAY HAVE CLEAR LIQUIDS FROM MIDNIGHT UNTIL 7:00AM . At 7:00 AM Please finish the prescribed Pre-Surgery Gatorade drink. Nothing by mouth after you finish the Gatorade drink !  CLEAR LIQUID DIET   Foods Allowed                                                                     Foods Excluded  Coffee and tea, regular and decaf                              liquids that you cannot  Plain Jell-O in any flavor                                             see through such as: Fruit ices (not with fruit pulp)                                     milk, soups, orange juice  Iced Popsicles                                    All solid food Carbonated beverages, regular and diet                                    Cranberry, grape and apple juices Sports drinks like Gatorade Lightly seasoned clear broth or consume(fat free) Sugar, honey syrup                                        BRUSH YOUR TEETH MORNING OF SURGERY AND RINSE YOUR MOUTH OUT, NO CHEWING GUM CANDY OR MINTS.     Take these medicines the morning of surgery with A SIP OF WATER                            : Amlodipine (Norvasc), Atorvastatin (Lipitor), Carvedilol (Coreg), Isosorbide Mononitrate (Imdur)               DO NOT TAKE ANY DIABETIC MEDICATIONS DAY OF YOUR SURGERY!                               You may  not have any metal on your body including hair pins and              piercings             Do not wear jewelry, make-up, lotions, powders or perfumes, deodorant             Do not wear nail polish.  Do not shave  48 hours prior to surgery.            Do not bring valuables to the hospital. [place].  Contacts, dentures or bridgework may not be worn into surgery.                     Please read over the following fact sheets you were given: _____________________________________________________________________             [facility] - Preparing for Surgery  Before surgery, you can play an important role.   Because skin is not sterile, your skin needs to be as free of germs as possible .  You can reduce the number of germs on your skin by washing with CHG (chlorahexidine gluconate) soap before surgery.  CHG is an antiseptic cleaner which kills germs and bonds with the skin to continue killing germs even after  washing. Please DO NOT use if you have an allergy to CHG or antibacterial soaps .  If your skin becomes reddened/irritated stop using the CHG and inform your nurse when you arrive at Short Stay. Do not shave (including legs and underarms) for at least 48 hours prior to the first CHG shower.  Please follow these instructions carefully:  1.  Shower with CHG Soap the night before surgery and the  morning of Surgery.  2.  If you choose to wash your hair, wash your hair first as usual with your  normal  shampoo.  3.  After you shampoo, rinse your hair and body thoroughly to remove the  shampoo.                                        4.  Use CHG as you would any other liquid soap.  You can apply chg directly  to the skin and wash                       Gently with a scrungie or clean washcloth.  5.  Apply the CHG Soap to your body ONLY FROM THE NECK DOWN.   Do not use on face/ open                           Wound or open sores. Avoid contact with eyes, ears mouth and genitals (private parts).                       Wash face,  Genitals (private parts) with your normal soap.             6.  Wash thoroughly, paying special attention to the area where your surgery  will be performed.  7.  Thoroughly rinse your body with warm water from the neck down.  8.  DO NOT shower/wash with your normal soap after using and  rinsing off  the CHG Soap.             9.  Pat yourself dry with a clean towel.            10.  Wear clean pajamas.            11.  Place clean sheets on your bed the night of your first shower and do not  sleep with pets.  Day of Surgery : Do not apply any lotions/deodorants the morning of surgery.  Please wear clean clothes to the hospital/surgery center.   FAILURE TO FOLLOW THESE INSTRUCTIONS MAY RESULT IN THE CANCELLATION OF YOUR SURGERY PATIENT SIGNATURE_________________________________  NURSE  SIGNATURE__________________________________  ________________________________________________________________________   Adam Phenix  An incentive spirometer is a tool that can help keep your lungs clear and active. This tool measures how well you are filling your lungs with each breath. Taking long deep breaths may help reverse or decrease the chance of developing breathing (pulmonary) problems (especially infection) following:  A long period of time when you are unable to move or be active. BEFORE THE PROCEDURE   If the spirometer includes an indicator to show your best effort, your nurse or respiratory therapist will set it to a desired goal.  If possible, sit up straight or lean slightly forward. Try not to slouch.  Hold the incentive spirometer in an upright position. INSTRUCTIONS FOR USE  1. Sit on the edge of your bed if possible, or sit up as far as you can in bed or on a chair. 2. Hold the incentive spirometer in an upright position. 3. Breathe out normally. 4. Place the mouthpiece in your mouth and seal your lips tightly around it. 5. Breathe in slowly and as deeply as possible, raising the piston or the ball toward the top of the column. 6. Hold your breath for 3-5 seconds or for as long as possible. Allow the piston or ball to fall to the bottom of the column. 7. Remove the mouthpiece from your mouth and breathe out normally. 8. Rest for a few seconds and repeat Steps 1 through 7 at least 10 times every 1-2 hours when you are awake. Take your time and take a few normal breaths between deep breaths. 9. The spirometer may include an indicator to show your best effort. Use the indicator as a goal to work toward during each repetition. 10. After each set of 10 deep breaths, practice coughing to be sure your lungs are clear. If you have an incision (the cut made at the time of surgery), support your incision when coughing by placing a pillow or rolled up towels firmly  against it. Once you are able to get out of bed, walk around indoors and cough well. You may stop using the incentive spirometer when instructed by your caregiver.  RISKS AND COMPLICATIONS  Take your time so you do not get dizzy or light-headed.  If you are in pain, you may need to take or ask for pain medication before doing incentive spirometry. It is harder to take a deep breath if you are having pain. AFTER USE  Rest and breathe slowly and easily.  It can be helpful to keep track of a log of your progress. Your caregiver can provide you with a simple table to help with this. If you are using the spirometer at home, follow these instructions: [place] IF:   You are having difficultly using the spirometer.  You have trouble using the  spirometer as often as instructed.  Your pain medication is not giving enough relief while using the spirometer.  You develop fever of 100.5 F (38.1 C) or higher. SEEK IMMEDIATE MEDICAL CARE IF:   You cough up bloody sputum that had not been present before.  You develop fever of 102 F (38.9 C) or greater.  You develop worsening pain at or near the incision site. MAKE SURE YOU:   Understand these instructions.  Will watch your condition.  Will get help right away if you are not doing well or get worse. Document Released: 09/27/2006 Document Revised: 08/09/2011 Document Reviewed: 11/28/2006 [employer] Patient Information 2014 [employer], [place].   ________________________________________________________________________  WHAT IS A BLOOD TRANSFUSION? Blood Transfusion Information  A transfusion is the replacement of blood or some of its parts. Blood is made up of multiple cells which provide different functions.  Red blood cells carry oxygen and are used for blood loss replacement.  White blood cells fight against infection.  Platelets control bleeding.  Plasma helps clot blood.  Other blood products are available for  specialized needs, such as hemophilia or other clotting disorders. BEFORE THE TRANSFUSION  Who gives blood for transfusions?   Healthy volunteers who are fully evaluated to make sure their blood is safe. This is blood bank blood. Transfusion therapy is the safest it has ever been in the practice of medicine. Before blood is taken from a donor, a complete history is taken to make sure that person has no history of diseases nor engages in risky social behavior (examples are intravenous drug use or sexual activity with multiple partners). The donor's travel history is screened to minimize risk of transmitting infections, such as malaria. The donated blood is tested for signs of infectious diseases, such as HIV and hepatitis. The blood is then tested to be sure it is compatible with you in order to minimize the chance of a transfusion reaction. If you or a relative donates blood, this is often done in anticipation of surgery and is not appropriate for emergency situations. It takes many days to process the donated blood. RISKS AND COMPLICATIONS Although transfusion therapy is very safe and saves many lives, the main dangers of transfusion include:   Getting an infectious disease.  Developing a transfusion reaction. This is an allergic reaction to something in the blood you were given. Every precaution is taken to prevent this. The decision to have a blood transfusion has been considered carefully by your caregiver before blood is given. Blood is not given unless the benefits outweigh the risks. AFTER THE TRANSFUSION  Right after receiving a blood transfusion, you will usually feel much better and more energetic. This is especially true if your red blood cells have gotten low (anemic). The transfusion raises the level of the red blood cells which carry oxygen, and this usually causes an energy increase.  The nurse administering the transfusion will monitor you carefully for complications. HOME CARE  INSTRUCTIONS  No special instructions are needed after a transfusion. You may find your energy is better. Speak with your caregiver about any limitations on activity for underlying diseases you may have. SEEK MEDICAL CARE IF:   Your condition is not improving after your transfusion.  You develop redness or irritation at the intravenous (IV) site. SEEK IMMEDIATE MEDICAL CARE IF:  Any of the following symptoms occur over the next 12 hours:  Shaking chills.  You have a temperature by mouth above 102 F (38.9 C), not controlled by medicine.  Chest,  back, or muscle pain.  People around you feel you are not acting correctly or are confused.  Shortness of breath or difficulty breathing.  Dizziness and fainting.  You get a rash or develop hives.  You have a decrease in urine output.  Your urine turns a dark color or changes to pink, red, or brown. Any of the following symptoms occur over the next 10 days:  You have a temperature by mouth above 102 F (38.9 C), not controlled by medicine.  Shortness of breath.  Weakness after normal activity.  The white part of the eye turns yellow (jaundice).  You have a decrease in the amount of urine or are urinating less often.  Your urine turns a dark color or changes to pink, red, or brown. Document Released: 05/14/2000 Document Revised: 08/09/2011 Document Reviewed: 01/01/2008 [facility] Patient Information 2014 [place], [place].  _______________________________________________________________________

## 2018-11-24 ENCOUNTER — Other Ambulatory Visit (HOSPITAL_COMMUNITY)
Admission: RE | Admit: 2018-11-24 | Discharge: 2018-11-24 | Disposition: A | Discharge status: Home / Self Care | Payer: Medicaid Other | Source: Ambulatory Visit | Attending: Gynecologic Oncology | Admitting: Gynecologic Oncology

## 2018-11-24 ENCOUNTER — Other Ambulatory Visit: Payer: Self-pay

## 2018-11-24 ENCOUNTER — Encounter (HOSPITAL_COMMUNITY)
Admission: RE | Admit: 2018-11-24 | Discharge: 2018-11-24 | Disposition: A | Discharge status: Home / Self Care | Payer: Medicaid Other | Source: Ambulatory Visit | Attending: Gynecologic Oncology | Admitting: Gynecologic Oncology

## 2018-11-24 ENCOUNTER — Encounter (HOSPITAL_COMMUNITY): Payer: Self-pay

## 2018-11-24 ENCOUNTER — Telehealth: Payer: Self-pay

## 2018-11-24 DIAGNOSIS — Z1159 Encounter for screening for other viral diseases: Secondary | ICD-10-CM | POA: Diagnosis not present

## 2018-11-24 DIAGNOSIS — Z01812 Encounter for preprocedural laboratory examination: Secondary | ICD-10-CM | POA: Diagnosis not present

## 2018-11-24 HISTORY — DX: Acute myocardial infarction, unspecified: I21.9

## 2018-11-24 LAB — ABO/RH: ABO/RH(D): O POS

## 2018-11-24 LAB — GLUCOSE, CAPILLARY: Glucose-Capillary: 65 mg/dL — ABNORMAL LOW (ref 70–99)

## 2018-11-24 LAB — CBC
HCT: 39.2 % (ref 36.0–46.0)
Hemoglobin: 12.7 g/dL (ref 12.0–15.0)
MCH: 28.3 pg (ref 26.0–34.0)
MCHC: 32.4 g/dL (ref 30.0–36.0)
MCV: 87.5 fL (ref 80.0–100.0)
Platelets: 236 10*3/uL (ref 150–400)
RBC: 4.48 MIL/uL (ref 3.87–5.11)
RDW: 14.8 % (ref 11.5–15.5)
WBC: 12.8 10*3/uL — ABNORMAL HIGH (ref 4.0–10.5)
nRBC: 0 % (ref 0.0–0.2)

## 2018-11-24 LAB — COMPREHENSIVE METABOLIC PANEL
ALT: 14 U/L (ref 0–44)
AST: 16 U/L (ref 15–41)
Albumin: 3.8 g/dL (ref 3.5–5.0)
Alkaline Phosphatase: 98 U/L (ref 38–126)
Anion gap: 6 (ref 5–15)
BUN: 19 mg/dL (ref 6–20)
CO2: 26 mmol/L (ref 22–32)
Calcium: 9 mg/dL (ref 8.9–10.3)
Chloride: 108 mmol/L (ref 98–111)
Creatinine, Ser: 0.96 mg/dL (ref 0.44–1.00)
GFR calc Af Amer: >60 mL/min (ref >60)
GFR calc non Af Amer: >60 mL/min (ref >60)
Glucose, Bld: 72 mg/dL (ref 70–99)
Potassium: 3.3 mmol/L — ABNORMAL LOW (ref 3.5–5.1)
Sodium: 140 mmol/L (ref 135–145)
Total Bilirubin: 0.7 mg/dL (ref 0.3–1.2)
Total Protein: 7.9 g/dL (ref 6.5–8.1)

## 2018-11-24 LAB — URINALYSIS, ROUTINE W REFLEX MICROSCOPIC
Bilirubin Urine: NEGATIVE
Glucose, UA: NEGATIVE mg/dL
Ketones, ur: NEGATIVE mg/dL
Leukocytes,Ua: NEGATIVE
Nitrite: NEGATIVE
Protein, ur: NEGATIVE mg/dL
Specific Gravity, Urine: 1.012 (ref 1.005–1.030)
pH: 6 (ref 5.0–8.0)

## 2018-11-24 LAB — SARS CORONAVIRUS 2 (TAT 6-24 HRS): SARS Coronavirus 2: NEGATIVE

## 2018-11-24 NOTE — Progress Notes (Signed)
SPOKE W/  [place]     SCREENING SYMPTOMS OF COVID 19:   COUGH--NO  RUNNY NOSE---NO   SORE THROAT---NO  NASAL CONGESTION----NO  SNEEZING----NO  SHORTNESS OF BREATH---NO  DIFFICULTY BREATHING---NO  TEMP >100.0 -----NO  UNEXPLAINED BODY ACHES------NO  CHILLS -------- NO  HEADACHES ---------NO  LOSS OF SMELL/ TASTE --------NO    HAVE YOU OR ANY FAMILY MEMBER TRAVELLED PAST 14 DAYS OUT OF THE   COUNTY---NO STATE----NO COUNTRY----NO  HAVE YOU OR ANY FAMILY MEMBER BEEN EXPOSED TO ANYONE WITH COVID 19? NO    

## 2018-11-24 NOTE — Telephone Encounter (Signed)
Reviewed KCL level 11-24-18 of 3.3 with Dr. Denman George. Pt currently on 20 meq of KCL bid from PCP. No additional KCL doses  needed at this time per Dr. Denman George.

## 2018-11-25 LAB — HEMOGLOBIN A1C
Hgb A1c MFr Bld: 7.9 % — ABNORMAL HIGH (ref 4.8–5.6)
Mean Plasma Glucose: 180 mg/dL

## 2018-11-27 ENCOUNTER — Other Ambulatory Visit: Payer: Self-pay

## 2018-11-27 MED ORDER — DEXTROSE 5 % IV SOLN
3.0000 g | INTRAVENOUS | Status: AC
  Administered 2018-11-28: 3 g via INTRAVENOUS
  Filled 2018-11-27: qty 3

## 2018-11-27 MED FILL — ?ATORVASTATIN 40MG TABLET: 40 | 30 days supply | Qty: 30 | Fill #2

## 2018-11-27 MED FILL — ISOSORBIDE MN ER 60 MG TAB: 60 | 30 days supply | Qty: 30 | Fill #2

## 2018-11-27 MED FILL — ?CARVEDILOL 25 MG TABLET: 25 | 30 days supply | Qty: 60 | Fill #2

## 2018-11-27 NOTE — Anesthesia Preprocedure Evaluation (Addendum)
Anesthesia Evaluation  Patient identified by MRN, date of birth, ID band Patient awake    Reviewed: Allergy & Precautions, NPO status , Patient's Chart, lab work & pertinent test results, reviewed documented beta blocker date and time   History of Anesthesia Complications Negative for: history of anesthetic complications  Airway Mallampati: II  TM Distance: >3 FB Neck ROM: Full    Dental  (+) Dental Advisory Given, Partial Upper   Pulmonary neg pulmonary ROS,    Pulmonary exam normal breath sounds clear to auscultation       Cardiovascular hypertension, Pt. on medications and Pt. on home beta blockers + angina + CAD and + Past MI  (-) Cardiac Stents and (-) CABG Normal cardiovascular exam Rhythm:Regular Rate:Normal  Echo 09/06/2018 IMPRESSIONS   1. Moderate akinesis of the left ventricular, entire apical segment. 2. The left ventricle has normal systolic function, with an ejection fraction of 55-60%. The cavity size was normal. There is severe concentric left ventricular hypertrophy. Left ventricular diastolic Doppler parameters are indeterminate. 3. LV thrombus excluded by echo contrast. 4. The right ventricle has normal systolic function. The cavity was normal. There is no increase in right ventricular wall thickness. Right ventricular systolic pressure could not be assessed. 5. Large pericardial effusion. 6. The pericardial effusion is circumferential. 7. Large pericardial effusion, largest area anterior to RV with what appears to be adherent material (question clot vs. fibrin vs. mass) at the RV apex. 8. The aortic valve is tricuspid. Mild calcification of the aortic valve. 9. The aortic root is normal in size and structure. 10. 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   Neuro/Psych PSYCHIATRIC DISORDERS Depression  Neuromuscular disease    GI/Hepatic negative GI ROS, Neg liver ROS,    Endo/Other  diabetes, Type 2, Insulin DependentMorbid obesity  Renal/GU Renal InsufficiencyRenal disease     Musculoskeletal  (+) Arthritis ,   Abdominal   Peds  Hematology negative hematology ROS (+)   Anesthesia Other Findings Day of surgery medications reviewed with the patient.  Reproductive/Obstetrics                          Anesthesia Physical Anesthesia Plan  ASA: III  Anesthesia Plan: General   Post-op Pain Management:    Induction: Intravenous  PONV Risk Score and Plan: 4 or greater and Scopolamine patch - Pre-op, Midazolam, Dexamethasone, Ondansetron and Diphenhydramine  Airway Management Planned: Oral ETT  Additional Equipment: Arterial line  Intra-op Plan:   Post-operative Plan: Extubation in OR  Informed Consent: I have reviewed the patients History and Physical, chart, labs and discussed the procedure including the risks, benefits and alternatives for the proposed anesthesia with the patient or authorized representative who has indicated his/her understanding and acceptance.     Dental advisory given  Plan Discussed with: CRNA  Anesthesia Plan Comments: (2nd PIV after induction; possible arterial line after induction.)      Anesthesia Quick Evaluation

## 2018-11-27 NOTE — Progress Notes (Signed)
Anesthesia Chart Review   Case: [id number] Date/Time: 11/28/18 0945   Procedures:      XI ROBOTIC ASSISTED TOTAL HYSTERECTOMY WITH BILATERAL SALPINGO OOPHORECTOMY (N/A )     SENTINEL NODE BIOPSY (N/A )   Anesthesia type: General   Pre-op diagnosis: ENDOMETRIAL CANCER   Location: [facility] ROOM 03 / [facility]   Surgeon: Everitt Amber, MD      DISCUSSION:58 y.o. never smoker with h/o HTN, depression, DM II, HLD, CKD State II, CAD, endometrial cancer scheduled for above procedure 11/28/2018 with Dr. Everitt Amber.   Surgeon wanted pt A1C less than 8 prior to surgery.  Seen by Endocrinologist, Dr. Kelton Pillar, 10/11/2018.  Medication adjustments made.  A1C 7.9 at PAT visit 11/28/2018.   Pt last seen by cardiologist, Dr. Buford Dresser, 09/18/2018.  Per OV note, "She has stable angina, and her imaging now shows CTO LAD with apical akinesis but overall preserved EF.  -we are aggressively treating CAD -we are uptitrating imdur for angina. Already on max beta blocker -on high intensity statin and aspirin -based on imaging, unlikely that she has good targets for stenting. Given her diabetes, she would likely need CABG, though we would need to get cath first for definitive anatomy.  -given that her surgery is for cancer, I do not think we should withold surgery. We spoke at length that she is at risk for perioperative MI given her CAD and risk factors, but short of CABG (which is higher risk than hysterectomy), she is otherwise optimized. Her risk of progressive cancer with delay of treatment is different than if this were a completely elective surgery, and therefore delay carries its own risk. Therefore, I recommend the following:  1) continue aspirin, beta blocker, and statin throughout the operative period 2) get updated ECG prior to surgery (can be done in PACU) to have a baseline 3) Monitor closely perioperatively; consider monitoring for 48 hour period post op 4) avoid high/low blood pressure and wide  swings in hemodynamics 5) If she has chest pain, would send troponins and get ECG 6) If there are concerns postoperatively, cardiology is available for consultation. Please page the cardmaster or on call team."  Anticipate pt can proceed with planned procedure barring acute status change.   VS: BP 130/72   Pulse 87   Temp 36.6 C (Oral)   Resp 16   Ht _0  (1.626 m)   Wt 125.1 kg   SpO2 98%   BMI 47.34 kg/m   PROVIDERS: Ladell Pier, MD is PCP   Shamleffer, Melanie Crazier, MD is Endocrinologist   Buford Dresser, MD is Cardiologist  LABS: Labs reviewed: Acceptable for surgery. (all labs ordered are listed, but only abnormal results are displayed)  Labs Reviewed  CBC - Abnormal; Notable for the following components:      Result Value   WBC 12.8 (*)    All other components within normal limits  COMPREHENSIVE METABOLIC PANEL - Abnormal; Notable for the following components:   Potassium 3.3 (*)    All other components within normal limits  URINALYSIS, ROUTINE W REFLEX MICROSCOPIC - Abnormal; Notable for the following components:   Hgb urine dipstick SMALL (*)    Bacteria, UA RARE (*)    All other components within normal limits  GLUCOSE, CAPILLARY - Abnormal; Notable for the following components:   Glucose-Capillary 65 (*)    All other components within normal limits  HEMOGLOBIN A1C - Abnormal; Notable for the following components:   Hgb A1c MFr Bld  7.9 (*)    All other components within normal limits  TYPE AND SCREEN  ABO/RH     IMAGES: CT Coronary 09/13/2018 IMPRESSION: 1. Coronary calcium score of 258. This was 68 percentile for age and sex matched control.  2. Normal coronary origin with right dominance.  3. Severe stenosis in a small distal RCA previously not amenable to intervention. Severe stenosis in the distal LAD and occluded mid to distal LAD - chronic occlusion given apical aneurysm (no thrombus within the aneurysm).  At least  moderate stenoses in the ostial portion of the first diagonal artery and mid portion of OM1.  4. Unchanged mild to moderate circumferential pericardial effusion (when compared to the chest CTA from 08/17/2018).  5. No evidence for an intracardiac mass.  EKG: 12/31/2017 Rate 118 bpm Sinus tachycardia Possible left atrial enlargement Left axis deviation Left ventricular hypertrophy Inferior infarct, age undetermined   CV: Echo 09/06/2018 IMPRESSIONS    1. Moderate akinesis of the left ventricular, entire apical segment.  2. The left ventricle has normal systolic function, with an ejection fraction of 55-60%. The cavity size was normal. There is severe concentric left ventricular hypertrophy. Left ventricular diastolic Doppler parameters are indeterminate.  3. LV thrombus excluded by echo contrast.  4. The right ventricle has normal systolic function. The cavity was normal. There is no increase in right ventricular wall thickness. Right ventricular systolic pressure could not be assessed.  5. Large pericardial effusion.  6. The pericardial effusion is circumferential.  7. Large pericardial effusion, largest area anterior to RV with what appears to be adherent material (question clot vs. fibrin vs. mass) at the RV apex.  8. The aortic valve is tricuspid. Mild calcification of the aortic valve.  9. The aortic root is normal in size and structure. 10. 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  Cardiac Cath 02/06/15 The patient has 80% distal LAD and 80% mid PDA stenoses.  The PDA is a relatively small vessel.  As Cardiolite was not high risk, I would plan initial medical management here.  I think PCI would be technically possible but given the distal location in the LAD and the relatively small caliber of the PDA, I think medical management initially is the best plan.   Stress Test 01/21/2015  Nuclear stress EF: 60%.  There was no ST segment deviation noted during  stress.  The left ventricular ejection fraction is normal (55-65%).   Low risk stress nuclear study with a small, moderate intensity, reversible lateral defect consistent with mild lateral ischemia; EF 60 with normal wall motion. Past Medical History:  Diagnosis Date  . Adrenal adenoma, left   . Arthritis   . Cataract    Bilateral  . CKD (chronic kidney disease), stage II   . Coronary artery disease 07-28-2018 followed [facility])  currently due to no insurance   per cardiac cath 02-06-2015 (positive mild lateral ishcemia on stress test)--- dLAD 80%,  mLAD 40%,  mPDA 80% (small vessel),  ostial D1 70%,  CFx with lumial irregarlities-- medical management  . Depression   . Diabetic neuropathy ([place])   . History of Bell's palsy 07/2011   per pt residual facial pain on left side occasionally  . History of cancer of vagina 1999   per pt completed radiation and chemo  . History of sepsis 12/30/2017   positive blood culter fro E.coli  . Hyperlipidemia   . Hypertension   . Hypokalemia   .  Insulin dependent type 2 diabetes mellitus, uncontrolled ([place])    followed by pcp---  A1c was 11.7 on 06-15-2018 in epic  . Myocardial infarction ([place])   . Nocturia   . Peripheral neuropathy   . PMB (postmenopausal bleeding)   . Wears dentures    upper  . Wears glasses     Past Surgical History:  Procedure Laterality Date  . BREAST BIOPSY  2012   benign  . CARDIAC CATHETERIZATION N/A 02/06/2015   Procedure: Left Heart Cath and Coronary Angiography;  Surgeon: Larey Dresser, MD;  Location: [place] [facility];  Service: Cardiovascular;  Laterality: N/A;  . HYSTEROSCOPY W/D&C N/A 08/01/2018   Procedure: DILATATION AND CURETTAGE /HYSTEROSCOPY;  Surgeon: Mora Bellman, MD;  Location: [place];  Service: Gynecology;  Laterality: N/A;  . UMBILICAL HERNIA REPAIR  child    MEDICATIONS: . acetaminophen (TYLENOL) 500 MG tablet  . amLODipine (NORVASC) 5 MG tablet  .  aspirin EC 81 MG tablet  . atorvastatin (LIPITOR) 40 MG tablet  . BD PEN NEEDLE NANO U/F 32G X 4 MM MISC  . Blood Glucose Monitoring Suppl (TRUE METRIX METER) w/Device KIT  . carvedilol (COREG) 25 MG tablet  . Dulaglutide (TRULICITY) 1.5 ZB/0.1TA SOPN  . gabapentin (NEURONTIN) 300 MG capsule  . glucose blood (TRUE METRIX BLOOD GLUCOSE TEST) test strip  . ibuprofen (ADVIL,MOTRIN) 600 MG tablet  . Insulin Glargine (LANTUS SOLOSTAR) 100 UNIT/ML Solostar Pen  . insulin lispro (HUMALOG) 100 UNIT/ML injection  . Insulin Pen Needle (ULTICARE MICRO PEN NEEDLES) 32G X 4 MM MISC  . isosorbide mononitrate (IMDUR) 60 MG 24 hr tablet  . nitroGLYCERIN (NITROSTAT) 0.4 MG SL tablet  . potassium chloride (K-DUR) 10 MEQ tablet  . senna-docusate (SENOKOT-S) 8.6-50 MG tablet  . traMADol (ULTRAM) 50 MG tablet  . TRUEPLUS LANCETS 28G MISC   No current facility-administered medications for this encounter.    Derrill Memo ON 11/28/2018] ceFAZolin (ANCEF) 3 g in dextrose 5 % 50 mL IVPB     Maia Plan [facility] Pre-Surgical Testing [phone] 11/27/18  11:31 AM

## 2018-11-28 ENCOUNTER — Telehealth: Payer: Self-pay | Admitting: Cardiology

## 2018-11-28 ENCOUNTER — Ambulatory Visit (HOSPITAL_COMMUNITY)
Admission: RE | Admit: 2018-11-28 | Discharge: 2018-11-29 | Disposition: A | Discharge status: Home / Self Care | Payer: Medicaid Other | Attending: Gynecologic Oncology | Admitting: Gynecologic Oncology

## 2018-11-28 ENCOUNTER — Other Ambulatory Visit: Payer: Self-pay

## 2018-11-28 ENCOUNTER — Encounter (HOSPITAL_COMMUNITY)
Admission: RE | Disposition: A | Discharge status: Home / Self Care | Payer: Self-pay | Source: Home / Self Care | Attending: Gynecologic Oncology

## 2018-11-28 ENCOUNTER — Ambulatory Visit (HOSPITAL_COMMUNITY): Payer: Medicaid Other | Admitting: Certified Registered"

## 2018-11-28 ENCOUNTER — Ambulatory Visit (HOSPITAL_COMMUNITY): Payer: Medicaid Other | Admitting: Physician Assistant

## 2018-11-28 ENCOUNTER — Encounter (HOSPITAL_COMMUNITY): Payer: Self-pay | Admitting: *Deleted

## 2018-11-28 DIAGNOSIS — N135 Crossing vessel and stricture of ureter without hydronephrosis: Secondary | ICD-10-CM

## 2018-11-28 DIAGNOSIS — E1122 Type 2 diabetes mellitus with diabetic chronic kidney disease: Secondary | ICD-10-CM | POA: Insufficient documentation

## 2018-11-28 DIAGNOSIS — K660 Peritoneal adhesions (postprocedural) (postinfection): Secondary | ICD-10-CM | POA: Insufficient documentation

## 2018-11-28 DIAGNOSIS — R319 Hematuria, unspecified: Secondary | ICD-10-CM | POA: Diagnosis not present

## 2018-11-28 DIAGNOSIS — C541 Malignant neoplasm of endometrium: Secondary | ICD-10-CM | POA: Diagnosis present

## 2018-11-28 DIAGNOSIS — M1990 Unspecified osteoarthritis, unspecified site: Secondary | ICD-10-CM | POA: Diagnosis not present

## 2018-11-28 DIAGNOSIS — Z794 Long term (current) use of insulin: Secondary | ICD-10-CM | POA: Diagnosis not present

## 2018-11-28 DIAGNOSIS — E1165 Type 2 diabetes mellitus with hyperglycemia: Secondary | ICD-10-CM

## 2018-11-28 DIAGNOSIS — Z7982 Long term (current) use of aspirin: Secondary | ICD-10-CM | POA: Diagnosis not present

## 2018-11-28 DIAGNOSIS — N182 Chronic kidney disease, stage 2 (mild): Secondary | ICD-10-CM | POA: Insufficient documentation

## 2018-11-28 DIAGNOSIS — Z833 Family history of diabetes mellitus: Secondary | ICD-10-CM | POA: Diagnosis not present

## 2018-11-28 DIAGNOSIS — E785 Hyperlipidemia, unspecified: Secondary | ICD-10-CM | POA: Insufficient documentation

## 2018-11-28 DIAGNOSIS — Z791 Long term (current) use of non-steroidal anti-inflammatories (NSAID): Secondary | ICD-10-CM | POA: Diagnosis not present

## 2018-11-28 DIAGNOSIS — Z79899 Other long term (current) drug therapy: Secondary | ICD-10-CM | POA: Diagnosis not present

## 2018-11-28 DIAGNOSIS — I129 Hypertensive chronic kidney disease with stage 1 through stage 4 chronic kidney disease, or unspecified chronic kidney disease: Secondary | ICD-10-CM | POA: Insufficient documentation

## 2018-11-28 DIAGNOSIS — Z807 Family history of other malignant neoplasms of lymphoid, hematopoietic and related tissues: Secondary | ICD-10-CM | POA: Diagnosis not present

## 2018-11-28 DIAGNOSIS — Z923 Personal history of irradiation: Secondary | ICD-10-CM | POA: Diagnosis not present

## 2018-11-28 DIAGNOSIS — I2510 Atherosclerotic heart disease of native coronary artery without angina pectoris: Secondary | ICD-10-CM | POA: Diagnosis present

## 2018-11-28 DIAGNOSIS — Z6841 Body Mass Index (BMI) 40.0 and over, adult: Secondary | ICD-10-CM | POA: Insufficient documentation

## 2018-11-28 DIAGNOSIS — E1140 Type 2 diabetes mellitus with diabetic neuropathy, unspecified: Secondary | ICD-10-CM | POA: Insufficient documentation

## 2018-11-28 DIAGNOSIS — I252 Old myocardial infarction: Secondary | ICD-10-CM | POA: Diagnosis not present

## 2018-11-28 DIAGNOSIS — F329 Major depressive disorder, single episode, unspecified: Secondary | ICD-10-CM | POA: Diagnosis not present

## 2018-11-28 DIAGNOSIS — Z8261 Family history of arthritis: Secondary | ICD-10-CM | POA: Insufficient documentation

## 2018-11-28 DIAGNOSIS — Z8541 Personal history of malignant neoplasm of cervix uteri: Secondary | ICD-10-CM | POA: Insufficient documentation

## 2018-11-28 DIAGNOSIS — K682 Retroperitoneal fibrosis: Secondary | ICD-10-CM

## 2018-11-28 DIAGNOSIS — I313 Pericardial effusion (noninflammatory): Secondary | ICD-10-CM | POA: Insufficient documentation

## 2018-11-28 DIAGNOSIS — I358 Other nonrheumatic aortic valve disorders: Secondary | ICD-10-CM | POA: Insufficient documentation

## 2018-11-28 DIAGNOSIS — E7800 Pure hypercholesterolemia, unspecified: Secondary | ICD-10-CM | POA: Diagnosis not present

## 2018-11-28 DIAGNOSIS — Z800 Family history of malignant neoplasm of digestive organs: Secondary | ICD-10-CM | POA: Insufficient documentation

## 2018-11-28 DIAGNOSIS — Z8249 Family history of ischemic heart disease and other diseases of the circulatory system: Secondary | ICD-10-CM | POA: Insufficient documentation

## 2018-11-28 DIAGNOSIS — IMO0002 Reserved for concepts with insufficient information to code with codable children: Secondary | ICD-10-CM | POA: Diagnosis present

## 2018-11-28 HISTORY — PX: ROBOTIC ASSISTED TOTAL HYSTERECTOMY WITH BILATERAL SALPINGO OOPHERECTOMY: SHX6086

## 2018-11-28 HISTORY — PX: SENTINEL NODE BIOPSY: SHX6608

## 2018-11-28 LAB — TYPE AND SCREEN
ABO/RH(D): O POS
Antibody Screen: NEGATIVE

## 2018-11-28 LAB — GLUCOSE, CAPILLARY
Glucose-Capillary: 133 mg/dL — ABNORMAL HIGH (ref 70–99)
Glucose-Capillary: 201 mg/dL — ABNORMAL HIGH (ref 70–99)
Glucose-Capillary: 226 mg/dL — ABNORMAL HIGH (ref 70–99)
Glucose-Capillary: 185 mg/dL — ABNORMAL HIGH (ref 70–99)
Glucose-Capillary: 227 mg/dL — ABNORMAL HIGH (ref 70–99)

## 2018-11-28 SURGERY — HYSTERECTOMY, TOTAL, ROBOT-ASSISTED, LAPAROSCOPIC, WITH BILATERAL SALPINGO-OOPHORECTOMY
Anesthesia: General

## 2018-11-28 MED ORDER — INSULIN GLARGINE 100 UNIT/ML ~~LOC~~ SOLN
40.0000 [IU] | Freq: Every day | SUBCUTANEOUS | Status: DC
  Administered 2018-11-28: 40 [IU] via SUBCUTANEOUS
  Filled 2018-11-28 (×2): qty 0.4

## 2018-11-28 MED ORDER — CARVEDILOL 25 MG PO TABS
25.0000 mg | ORAL_TABLET | Freq: Two times a day (BID) | ORAL | Status: DC
  Administered 2018-11-28 – 2018-11-29 (×2): 25 mg via ORAL
  Filled 2018-11-28 (×3): qty 1

## 2018-11-28 MED ORDER — PROPOFOL 10 MG/ML IV BOLUS
INTRAVENOUS | Status: DC | PRN
  Administered 2018-11-28: 140 mg via INTRAVENOUS

## 2018-11-28 MED ORDER — MIDAZOLAM HCL 2 MG/2ML IJ SOLN
INTRAMUSCULAR | Status: AC
  Filled 2018-11-28: qty 2

## 2018-11-28 MED ORDER — STERILE WATER FOR INJECTION IJ SOLN
INTRAMUSCULAR | Status: DC | PRN
  Administered 2018-11-28: 10 mL

## 2018-11-28 MED ORDER — KCL IN DEXTROSE-NACL 20-5-0.45 MEQ/L-%-% IV SOLN
INTRAVENOUS | Status: DC
  Administered 2018-11-28 – 2018-11-29 (×2): via INTRAVENOUS
  Filled 2018-11-28 (×2): qty 1000

## 2018-11-28 MED ORDER — SENNOSIDES-DOCUSATE SODIUM 8.6-50 MG PO TABS
2.0000 | ORAL_TABLET | Freq: Every day | ORAL | Status: DC
  Administered 2018-11-28: 2 via ORAL
  Filled 2018-11-28 (×2): qty 2

## 2018-11-28 MED ORDER — ISOSORBIDE MONONITRATE ER 60 MG PO TB24
60.0000 mg | ORAL_TABLET | Freq: Every day | ORAL | Status: DC
  Administered 2018-11-29: 60 mg via ORAL
  Filled 2018-11-28 (×2): qty 1

## 2018-11-28 MED ORDER — LIDOCAINE 2% (20 MG/ML) 5 ML SYRINGE
INTRAMUSCULAR | Status: DC | PRN
  Administered 2018-11-28: 60 mg via INTRAVENOUS
  Administered 2018-11-28: 100 mg via INTRAVENOUS

## 2018-11-28 MED ORDER — ONDANSETRON HCL 4 MG/2ML IJ SOLN: 4.0000 mg | Freq: Four times a day (QID) | INTRAMUSCULAR | Status: DC | PRN

## 2018-11-28 MED ORDER — ONDANSETRON HCL 4 MG/2ML IJ SOLN
INTRAMUSCULAR | Status: DC | PRN
  Administered 2018-11-28: 4 mg via INTRAVENOUS

## 2018-11-28 MED ORDER — SUCCINYLCHOLINE CHLORIDE 200 MG/10ML IV SOSY
PREFILLED_SYRINGE | INTRAVENOUS | Status: DC | PRN
  Administered 2018-11-28: 120 mg via INTRAVENOUS

## 2018-11-28 MED ORDER — INSULIN ASPART 100 UNIT/ML ~~LOC~~ SOLN
18.0000 [IU] | Freq: Three times a day (TID) | SUBCUTANEOUS | Status: DC
  Administered 2018-11-29: 18 [IU] via SUBCUTANEOUS

## 2018-11-28 MED ORDER — ASPIRIN EC 81 MG PO TBEC
81.0000 mg | DELAYED_RELEASE_TABLET | Freq: Every day | ORAL | Status: DC
  Administered 2018-11-29: 81 mg via ORAL
  Filled 2018-11-28 (×2): qty 1

## 2018-11-28 MED ORDER — ENOXAPARIN SODIUM 40 MG/0.4ML ~~LOC~~ SOLN
40.0000 mg | SUBCUTANEOUS | Status: DC
  Administered 2018-11-29: 40 mg via SUBCUTANEOUS
  Filled 2018-11-28 (×2): qty 0.4

## 2018-11-28 MED ORDER — SODIUM CHLORIDE 0.9 % IV SOLN
INTRAVENOUS | Status: DC | PRN
  Administered 2018-11-28: 75 ug/min via INTRAVENOUS

## 2018-11-28 MED ORDER — GABAPENTIN 300 MG PO CAPS
300.0000 mg | ORAL_CAPSULE | Freq: Three times a day (TID) | ORAL | Status: DC
  Administered 2018-11-28 – 2018-11-29 (×2): 300 mg via ORAL
  Filled 2018-11-28 (×2): qty 1

## 2018-11-28 MED ORDER — LACTATED RINGERS IR SOLN
Status: DC | PRN
  Administered 2018-11-28: 1000 mL

## 2018-11-28 MED ORDER — GABAPENTIN 300 MG PO CAPS
300.0000 mg | ORAL_CAPSULE | ORAL | Status: AC
  Administered 2018-11-28: 300 mg via ORAL
  Filled 2018-11-28: qty 1

## 2018-11-28 MED ORDER — PROPOFOL 10 MG/ML IV BOLUS
INTRAVENOUS | Status: AC
  Filled 2018-11-28: qty 20

## 2018-11-28 MED ORDER — STERILE WATER FOR INJECTION IJ SOLN
INTRAMUSCULAR | Status: AC
  Filled 2018-11-28: qty 10

## 2018-11-28 MED ORDER — INSULIN ASPART 100 UNIT/ML ~~LOC~~ SOLN
0.0000 [IU] | Freq: Every day | SUBCUTANEOUS | Status: DC
  Administered 2018-11-28: 2 [IU] via SUBCUTANEOUS

## 2018-11-28 MED ORDER — ONDANSETRON HCL 4 MG PO TABS
4.0000 mg | ORAL_TABLET | Freq: Four times a day (QID) | ORAL | Status: DC | PRN
  Filled 2018-11-28: qty 1

## 2018-11-28 MED ORDER — DIPHENHYDRAMINE HCL 50 MG/ML IJ SOLN
INTRAMUSCULAR | Status: DC | PRN
  Administered 2018-11-28: 12.5 mg via INTRAVENOUS

## 2018-11-28 MED ORDER — SCOPOLAMINE 1 MG/3DAYS TD PT72
1.0000 | MEDICATED_PATCH | TRANSDERMAL | Status: DC
  Administered 2018-11-28: 1.5 mg via TRANSDERMAL
  Filled 2018-11-28: qty 1

## 2018-11-28 MED ORDER — AMLODIPINE BESYLATE 5 MG PO TABS
5.0000 mg | ORAL_TABLET | Freq: Every day | ORAL | Status: DC
  Administered 2018-11-29: 08:00:00 5 mg via ORAL
  Filled 2018-11-28 (×2): qty 1

## 2018-11-28 MED ORDER — ROCURONIUM BROMIDE 10 MG/ML (PF) SYRINGE
PREFILLED_SYRINGE | INTRAVENOUS | Status: DC | PRN
  Administered 2018-11-28: 10 mg via INTRAVENOUS
  Administered 2018-11-28: 40 mg via INTRAVENOUS

## 2018-11-28 MED ORDER — ACETAMINOPHEN 500 MG PO TABS
1000.0000 mg | ORAL_TABLET | Freq: Two times a day (BID) | ORAL | Status: DC
  Administered 2018-11-29: 1000 mg via ORAL
  Filled 2018-11-28: qty 2

## 2018-11-28 MED ORDER — BUPIVACAINE HCL (PF) 0.25 % IJ SOLN
INTRAMUSCULAR | Status: AC
  Filled 2018-11-28: qty 30

## 2018-11-28 MED ORDER — ENOXAPARIN SODIUM 40 MG/0.4ML ~~LOC~~ SOLN
40.0000 mg | SUBCUTANEOUS | Status: AC
  Administered 2018-11-28: 40 mg via SUBCUTANEOUS
  Filled 2018-11-28: qty 0.4

## 2018-11-28 MED ORDER — OXYCODONE HCL 5 MG PO TABS: 5.0000 mg | ORAL_TABLET | ORAL | Status: DC | PRN

## 2018-11-28 MED ORDER — INSULIN ASPART 100 UNIT/ML ~~LOC~~ SOLN
0.0000 [IU] | Freq: Three times a day (TID) | SUBCUTANEOUS | Status: DC
  Administered 2018-11-28: 4 [IU] via SUBCUTANEOUS
  Administered 2018-11-29: 7 [IU] via SUBCUTANEOUS

## 2018-11-28 MED ORDER — TRAMADOL HCL 50 MG PO TABS
100.0000 mg | ORAL_TABLET | Freq: Two times a day (BID) | ORAL | Status: DC | PRN
  Administered 2018-11-28: 100 mg via ORAL
  Filled 2018-11-28: qty 2

## 2018-11-28 MED ORDER — FENTANYL CITRATE (PF) 250 MCG/5ML IJ SOLN
INTRAMUSCULAR | Status: DC | PRN
  Administered 2018-11-28: 100 ug via INTRAVENOUS
  Administered 2018-11-28 (×3): 50 ug via INTRAVENOUS

## 2018-11-28 MED ORDER — PROMETHAZINE HCL 25 MG/ML IJ SOLN: 6.2500 mg | INTRAMUSCULAR | Status: DC | PRN

## 2018-11-28 MED ORDER — MIDAZOLAM HCL 2 MG/2ML IJ SOLN
INTRAMUSCULAR | Status: DC | PRN
  Administered 2018-11-28 (×2): 1 mg via INTRAVENOUS

## 2018-11-28 MED ORDER — BUPIVACAINE HCL 0.25 % IJ SOLN
INTRAMUSCULAR | Status: DC | PRN
  Administered 2018-11-28: 20 mL

## 2018-11-28 MED ORDER — CARVEDILOL 25 MG PO TABS
25.0000 mg | ORAL_TABLET | Freq: Once | ORAL | Status: AC
  Administered 2018-11-28: 25 mg via ORAL
  Filled 2018-11-28: qty 1

## 2018-11-28 MED ORDER — FENTANYL CITRATE (PF) 100 MCG/2ML IJ SOLN: 25.0000 ug | INTRAMUSCULAR | Status: DC | PRN

## 2018-11-28 MED ORDER — FENTANYL CITRATE (PF) 250 MCG/5ML IJ SOLN
INTRAMUSCULAR | Status: AC
  Filled 2018-11-28: qty 5

## 2018-11-28 MED ORDER — SUGAMMADEX SODIUM 500 MG/5ML IV SOLN
INTRAVENOUS | Status: DC | PRN
  Administered 2018-11-28: 300 mg via INTRAVENOUS

## 2018-11-28 MED ORDER — HYDROMORPHONE HCL 1 MG/ML IJ SOLN: 0.5000 mg | INTRAMUSCULAR | Status: DC | PRN

## 2018-11-28 MED ORDER — DIPHENHYDRAMINE HCL 50 MG/ML IJ SOLN
INTRAMUSCULAR | Status: AC
  Filled 2018-11-28: qty 1

## 2018-11-28 MED ORDER — DEXAMETHASONE SODIUM PHOSPHATE 4 MG/ML IJ SOLN: 4.0000 mg | INTRAMUSCULAR | Status: DC

## 2018-11-28 MED ORDER — DEXAMETHASONE SODIUM PHOSPHATE 10 MG/ML IJ SOLN
INTRAMUSCULAR | Status: DC | PRN
  Administered 2018-11-28: 4 mg via INTRAVENOUS

## 2018-11-28 MED ORDER — INSULIN ASPART 100 UNIT/ML ~~LOC~~ SOLN
4.0000 [IU] | Freq: Once | SUBCUTANEOUS | Status: AC
  Administered 2018-11-28: 4 [IU] via SUBCUTANEOUS

## 2018-11-28 MED ORDER — LACTATED RINGERS IV SOLN
INTRAVENOUS | Status: DC | PRN
  Administered 2018-11-28: 11:00:00 via INTRAVENOUS

## 2018-11-28 MED ORDER — LACTATED RINGERS IV SOLN
INTRAVENOUS | Status: DC
  Administered 2018-11-28: 11:00:00 via INTRAVENOUS

## 2018-11-28 MED ORDER — ACETAMINOPHEN 500 MG PO TABS
1000.0000 mg | ORAL_TABLET | ORAL | Status: AC
  Administered 2018-11-28: 1000 mg via ORAL
  Filled 2018-11-28: qty 2

## 2018-11-28 MED ORDER — STERILE WATER FOR IRRIGATION IR SOLN
Status: DC | PRN
  Administered 2018-11-28: 1000 mL

## 2018-11-28 MED ORDER — INSULIN ASPART 100 UNIT/ML ~~LOC~~ SOLN
SUBCUTANEOUS | Status: AC
  Filled 2018-11-28: qty 1

## 2018-11-28 SURGICAL SUPPLY — 61 items
ADH SKN CLS APL DERMABOND .7 (GAUZE/BANDAGES/DRESSINGS) ×1
AGENT HMST KT MTR STRL THRMB (HEMOSTASIS)
APL ESCP 34 STRL LF DISP (HEMOSTASIS)
APPLICATOR SURGIFLO ENDO (HEMOSTASIS) IMPLANT
BAG LAPAROSCOPIC 12 15 PORT 16 (BASKET) IMPLANT
BAG RETRIEVAL 12/15 (BASKET)
BAG RETRIEVAL 12/15MM (BASKET)
BAG SPEC RTRVL LRG 6X4 10 (ENDOMECHANICALS)
COVER BACK TABLE 60X90IN (DRAPES) ×3 IMPLANT
COVER TIP SHEARS 8 DVNC (MISCELLANEOUS) ×1 IMPLANT
COVER TIP SHEARS 8MM DA VINCI (MISCELLANEOUS) ×2
COVER WAND RF STERILE (DRAPES) IMPLANT
DECANTER SPIKE VIAL GLASS SM (MISCELLANEOUS) ×4 IMPLANT
DERMABOND ADVANCED (GAUZE/BANDAGES/DRESSINGS) ×2
DERMABOND ADVANCED .7 DNX12 (GAUZE/BANDAGES/DRESSINGS) ×1 IMPLANT
DRAIN CHANNEL RND F F (WOUND CARE) IMPLANT
DRAPE ARM DVNC X/XI (DISPOSABLE) ×4 IMPLANT
DRAPE COLUMN DVNC XI (DISPOSABLE) ×1 IMPLANT
DRAPE DA VINCI XI ARM (DISPOSABLE) ×8
DRAPE DA VINCI XI COLUMN (DISPOSABLE) ×2
DRAPE SHEET LG 3/4 BI-LAMINATE (DRAPES) ×3 IMPLANT
DRAPE SURG IRRIG POUCH 19X23 (DRAPES) ×3 IMPLANT
ELECT REM PT RETURN 15FT ADLT (MISCELLANEOUS) ×3 IMPLANT
GAUZE 4X4 16PLY RFD (DISPOSABLE) IMPLANT
GLOVE BIO SURGEON STRL SZ 6 (GLOVE) ×12 IMPLANT
GLOVE BIO SURGEON STRL SZ 6.5 (GLOVE) ×2 IMPLANT
GLOVE BIO SURGEONS STRL SZ 6.5 (GLOVE) ×2
GOWN STRL REUS W/ TWL LRG LVL3 (GOWN DISPOSABLE) ×2 IMPLANT
GOWN STRL REUS W/TWL LRG LVL3 (GOWN DISPOSABLE) ×12
HOLDER FOLEY CATH W/STRAP (MISCELLANEOUS) ×1 IMPLANT
IRRIG SUCT STRYKERFLOW 2 WTIP (MISCELLANEOUS) ×3
IRRIGATION SUCT STRKRFLW 2 WTP (MISCELLANEOUS) ×1 IMPLANT
KIT PROCEDURE DA VINCI SI (MISCELLANEOUS) ×2
KIT PROCEDURE DVNC SI (MISCELLANEOUS) IMPLANT
KIT TURNOVER KIT A (KITS) ×2 IMPLANT
MANIPULATOR UTERINE 4.5 ZUMI (MISCELLANEOUS) ×3 IMPLANT
NDL SPNL 18GX3.5 QUINCKE PK (NEEDLE) IMPLANT
NEEDLE HYPO 22GX1.5 SAFETY (NEEDLE) IMPLANT
NEEDLE SPNL 18GX3.5 QUINCKE PK (NEEDLE) ×3 IMPLANT
OBTURATOR OPTICAL STANDARD 8MM (TROCAR) ×2
OBTURATOR OPTICAL STND 8 DVNC (TROCAR) ×1
OBTURATOR OPTICALSTD 8 DVNC (TROCAR) ×1 IMPLANT
PACK ROBOT GYN CUSTOM WL (TRAY / TRAY PROCEDURE) ×3 IMPLANT
PAD POSITIONING PINK XL (MISCELLANEOUS) ×3 IMPLANT
PORT ACCESS TROCAR AIRSEAL 12 (TROCAR) ×1 IMPLANT
PORT ACCESS TROCAR AIRSEAL 5M (TROCAR) ×2
POUCH SPECIMEN RETRIEVAL 10MM (ENDOMECHANICALS) IMPLANT
SEAL CANN UNIV 5-8 DVNC XI (MISCELLANEOUS) ×3 IMPLANT
SEAL XI 5MM-8MM UNIVERSAL (MISCELLANEOUS) ×8
SET TRI-LUMEN FLTR TB AIRSEAL (TUBING) ×3 IMPLANT
SURGIFLO W/THROMBIN 8M KIT (HEMOSTASIS) IMPLANT
SUT VIC AB 0 CT1 27 (SUTURE)
SUT VIC AB 0 CT1 27XBRD ANTBC (SUTURE) IMPLANT
SUT VIC AB 3-0 SH 27 (SUTURE) ×9
SUT VIC AB 3-0 SH 27XBRD (SUTURE) IMPLANT
SUT VIC AB 4-0 PS2 18 (SUTURE) ×6 IMPLANT
SYR 10ML LL (SYRINGE) ×2 IMPLANT
TOWEL OR NON WOVEN STRL DISP B (DISPOSABLE) ×3 IMPLANT
TRAP SPECIMEN MUCOUS 40CC (MISCELLANEOUS) IMPLANT
TRAY FOLEY MTR SLVR 16FR STAT (SET/KITS/TRAYS/PACK) ×3 IMPLANT
UNDERPAD 30X30 (UNDERPADS AND DIAPERS) ×3 IMPLANT

## 2018-11-28 NOTE — Anesthesia Procedure Notes (Signed)
Date/Time: 11/28/2018 1:29 PM Performed by: Cynda Familia, CRNA Oxygen Delivery Method: Simple face mask Placement Confirmation: positive ETCO2 and breath sounds checked- equal and bilateral Dental Injury: Teeth and Oropharynx as per pre-operative assessment

## 2018-11-28 NOTE — Anesthesia Postprocedure Evaluation (Signed)
Anesthesia Post Note  Patient: Kimberly Frazier  Procedure(s) Performed: XI ROBOTIC ASSISTED TOTAL HYSTERECTOMY WITH BILATERAL SALPINGO OOPHORECTOMY (N/A ) SENTINEL NODE BIOPSY (N/A )     Patient location during evaluation: PACU Anesthesia Type: General Level of consciousness: awake and alert Pain management: pain level controlled Vital Signs Assessment: post-procedure vital signs reviewed and stable Respiratory status: spontaneous breathing, nonlabored ventilation, respiratory function stable and patient connected to nasal cannula oxygen Cardiovascular status: blood pressure returned to baseline and stable Postop Assessment: no apparent nausea or vomiting Anesthetic complications: no    Last Vitals:  Vitals:   11/28/18 1445 11/28/18 1500  BP: (!) 156/90 (!) 149/88  Pulse: 73 77  Resp: 16 20  Temp:    SpO2: 95% 93%    Last Pain:  Vitals:   11/28/18 1500  TempSrc:   PainSc: 0-No pain                 Catalina Gravel

## 2018-11-28 NOTE — H&P (Signed)
Follow-up Note: Gyn-Onc  Consult was initially requested by Dr. Elly Modena for the evaluation of Kimberly Frazier 58 y.o. female  CC:  No chief complaint on file.   Assessment/Plan:  Kimberly Frazier  is a 58 y.o.  year old with high grade endometrial cancer in the setting of morbid obesity (BMI >40), very poorly controlled DM, CAD and a history of prior gyn cancer (cervical vs vaginal) treated with radiation.  She desires to proceed with robotic assisted total hysterectomy with bilateral sapingo-oophorectomy and SLN biopsy.   She will be seen by anesthesia for preoperative clearance and discussion of postoperative pain management.  She was given the opportunity to ask questions, which were answered to her satisfaction, and she is agreement with the above mentioned plan of care.  She has optimized her blood glucose as much as possible, preop but her poorly controlled diabetes continues to place her at increased risk for perioperative complications.   It is reasonable for her to continue aspirin 55m perioperatively though this does increase risk for postop bleeding.  HPI: Kimberly HKyann Heydtis a 58year old P2 who is seen in consultation at the request of Dr CElly Modenafor grade 3 endometrial cancer.  The patient reports a history of light vaginal spotting for approximately 1 year.  Since January 2020 she developed heavy vaginal bleeding and this prompted her to see Dr. CElly Modenawho performed a transvaginal ultrasound on June 20, 2018.  This revealed a uterus measuring 7.6 x 3.1 x 4.9 cm, there was a hypoechoic masslike area in the cervix measuring 1.6 x 1.4 x 1.9 cm.  The endometrium was thickened at 14 mm.  It contained an endometrial mass measuring 2.6 cm in greatest dimension.  She was taken to the operating room on August 01, 2018 with Dr. CElly Modena  She performed the hysteroscopy D&C.  Intraoperative findings were significant for an 8-week size uterus, diffuse proliferative endometrium, but no  mention of an endometrial mass.  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  Of note the patient has a history of a Pap smear in January 2020 that was normal with no high risk HPV detected.  The patient's medical history is complex.  She has a remote history of a gynecologic malignancy in 1999.  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  She describes treatment with primary brachii therapy (she describes being in bed in the hospital for 5 days with radiation internally).  She is unsure if she received chemotherapy.  She did not receive surgery for this.  She received follow-up for approximately a year after treatment and then discontinued.  Her medical history is also significant for coronary artery disease.  The patient had what she describes as a light myocardial infarction in 2016.  This was managed with a coronary angiography, however no stent was placed.  She did not receive ongoing cardiology evaluation as the patient reports that she did not have health insurance.  She was initially treated with medical therapy.  Her only medical therapy at the time of this diagnosis with aspirin 81 mg daily.  Patient has a history of type 2 diabetes mellitus for which she takes insulin.  She reports poor blood glucose control.  She reports not particularly being compliant with her diet.  She has sequelae of diabetic neuropathy in hands and feet and chronic kidney disease.  She has hypertension and hypercholesterolemia.  The patient is morbidly obese with a BMI  of 48 kg/m.  Her prior abdominal surgery includes 1 prior cesarean section and, as a child, a midline laparotomy for a hernia repair.  Her family history is significant for father who had a diagnosis of chins lymphoma, and a brother had a history of colon cancer.  Interval Hx:  Her HbA1c was elevated to 12% in March, 2020.  She was seen by  cardiology who felt that she was moderate/high risk for perioperative cardiac events but that she was maximally optimized for a hysterectomy and did not advocate for CABG beforehand.  CT abd/pelvis on 08/17/18 showed no apparent extrauterine disease. Small ground glass opacities were seen in the lower lungs -recommended 6 month follow-up.  She has been optimizing her blood glucose control.   Repeat HbA1c on 11/22/18 was improved at 7.9%.   Current Meds:  Outpatient Encounter Medications as of 10/20/2018  Medication Sig  . amLODipine (NORVASC) 5 MG tablet Take 1 tablet (5 mg total) by mouth daily.  Marland Kitchen aspirin EC 81 MG tablet Take 1 tablet (81 mg total) daily by mouth.  Marland Kitchen atorvastatin (LIPITOR) 40 MG tablet TAKE 1 TABLET DAILY BY MOUTH.  . BD PEN NEEDLE NANO U/F 32G X 4 MM MISC USE TWICE DAILY AS DIRECTED  . Blood Glucose Monitoring Suppl (TRUE METRIX METER) w/Device KIT Use as directed  . carvedilol (COREG) 25 MG tablet TAKE 1 TABLET (25 MG TOTAL) BY MOUTH 2 (TWO) TIMES DAILY.  . Dulaglutide (TRULICITY) 1.5 GN/0.0BB SOPN Inject 1.5 mg into the skin once a week.  . gabapentin (NEURONTIN) 300 MG capsule Take 1 capsule (300 mg total) by mouth at bedtime. (Patient taking differently: Take 300 mg by mouth as needed. )  . glucose blood (TRUE METRIX BLOOD GLUCOSE TEST) test strip Use as instructed  . ibuprofen (ADVIL,MOTRIN) 600 MG tablet Take 1 tablet (600 mg total) by mouth every 6 (six) hours as needed.  . Insulin Glargine (LANTUS SOLOSTAR) 100 UNIT/ML Solostar Pen Inject 55 Units into the skin daily.  . insulin lispro (HUMALOG) 100 UNIT/ML injection Inject 0.24 mLs (24 Units total) into the skin 3 (three) times daily with meals. INJECT 28 UNITS INTO THE SKIN THREE TIMES DAILY BEFORE MEALS.  Marland Kitchen Insulin Pen Needle (ULTICARE MICRO PEN NEEDLES) 32G X 4 MM MISC Use as directed with insulin  . isosorbide mononitrate (IMDUR) 60 MG 24 hr tablet Take 1 tablet (60 mg total) by mouth daily.  . nitroGLYCERIN  (NITROSTAT) 0.4 MG SL tablet Place 1 tab under tongue for chest pain.  May repeat after 5 minutes x 2.  DO NOT TAKE MORE THAN 3 TABS DURING AN EPISODE OF CHEST PAIN  . potassium chloride (K-DUR) 10 MEQ tablet Take 2 tablets (20 mEq total) by mouth daily.  . traMADol (ULTRAM) 50 MG tablet Take 1 tablet (50 mg total) by mouth every 6 (six) hours as needed for severe pain. For AFTER surgery, do not take and drive  . TRUEPLUS LANCETS 28G MISC Use as directted   No facility-administered encounter medications on file as of 10/20/2018.     Allergy: No Known Allergies  Social Hx:   Social History   Socioeconomic History  . Marital status: Single    Spouse name: Not on file  . Number of children: 2  . Years of education: 92  . Highest education level: Not on file  Occupational History  . Occupation: retired  Scientific laboratory technician  . Financial resource strain: Not on file  . Food insecurity  Worry: Often true    Inability: Sometimes true  . Transportation needs    Medical: No    Non-medical: No  Tobacco Use  . Smoking status: Never Smoker  . Smokeless tobacco: Never Used  Substance and Sexual Activity  . Alcohol use: Not Currently  . Drug use: Not Currently    Types: Marijuana    Comment: 07-28-2018  per pt last smoked 11/21/2018  . Sexual activity: Yes  Lifestyle  . Physical activity    Days per week: 0 days    Minutes per session: 0 min  . Stress: Only a little  Relationships  . Social connections    Talks on phone: More than three times a week    Gets together: More than three times a week    Attends religious service: Never    Active member of club or organization: No    Attends meetings of clubs or organizations: Never    Relationship status: Living with partner  . Intimate partner violence    Fear of current or ex partner: No    Emotionally abused: No    Physically abused: No    Forced sexual activity: No  Other Topics Concern  . Not on file  Social History Narrative    Regular exercise-no   No caffeine use    Past Surgical Hx:  Past Surgical History:  Procedure Laterality Date  . BREAST BIOPSY  2012   benign  . CARDIAC CATHETERIZATION N/A 02/06/2015   Procedure: Left Heart Cath and Coronary Angiography;  Surgeon: Larey Dresser, MD;  Location: [place] [facility];  Service: Cardiovascular;  Laterality: N/A;  . HYSTEROSCOPY W/D&C N/A 08/01/2018   Procedure: DILATATION AND CURETTAGE /HYSTEROSCOPY;  Surgeon: Mora Bellman, MD;  Location: [place];  Service: Gynecology;  Laterality: N/A;  . UMBILICAL HERNIA REPAIR  child    Past Medical Hx:  Past Medical History:  Diagnosis Date  . Adrenal adenoma, left   . Arthritis   . Cataract    Bilateral  . CKD (chronic kidney disease), stage II   . Coronary artery disease 07-28-2018 followed [facility]  currently due to no insurance   per cardiac cath 02-06-2015 (positive mild lateral ishcemia on stress test)--- dLAD 80%,  mLAD 40%,  mPDA 80% (small vessel),  ostial D1 70%,  CFx with lumial irregarlities-- medical management  . Depression   . Diabetic neuropathy ([place])   . History of Bell's palsy 07/2011   per pt residual facial pain on left side occasionally  . History of cancer of vagina 1999   per pt completed radiation and chemo  . History of sepsis 12/30/2017   positive blood culter fro E.coli  . Hyperlipidemia   . Hypertension   . Hypokalemia   . Insulin dependent type 2 diabetes mellitus, uncontrolled ([place])    followed by pcp---  A1c was 11.7 on 06-15-2018 in epic  . Myocardial infarction ([place])   . Nocturia   . Peripheral neuropathy   . PMB (postmenopausal bleeding)   . Wears dentures    upper  . Wears glasses     Past Gynecological History:  See HPI No LMP recorded. Patient is postmenopausal.  Family Hx:  Family History  Problem Relation Age of Onset  . Heart disease Mother   . Hypertension Mother   . Diabetes Mother   . Cancer Father         hodgkins lymphoma  . Heart disease Sister  heart attack  . Diabetes Sister   . Cancer Other        parent  . Diabetes Other        parent  . Heart disease Other        parent  . Hyperlipidemia Other        parent  . Hypertension Other        parent  . Arthritis Other        parent  . Diabetes Maternal Aunt   . Diabetes Maternal Uncle   . Breast cancer Neg Hx     Review of Systems:  Constitutional  Feels well,    ENT Normal appearing ears and nares bilaterally Skin/Breast  No rash, sores, jaundice, itching, dryness Cardiovascular  No chest pain, shortness of breath, or edema  Pulmonary  No cough or wheeze.  Gastro Intestinal  No nausea, vomitting, or diarrhoea. No bright red blood per rectum, no abdominal pain, change in bowel movement, or constipation.  Genito Urinary  No frequency, urgency, dysuria, + postmenopausal bleeding Musculo Skeletal  No myalgia, arthralgia, joint swelling or pain  Neurologic  No weakness, numbness, change in gait,  Psychology  No depression, anxiety, insomnia.   Vitals:  Blood pressure (!) 144/99, pulse (!) 101, temperature 98.3 F (36.8 C), temperature source Oral, resp. rate 20, height _0  (1.626 m), weight 275 lb 12.8 oz (125.1 kg), SpO2 98 %.  Physical Exam: WD in NAD Neck  Supple NROM, without any enlargements.  Lymph Node Survey No cervical supraclavicular or inguinal adenopathy Cardiovascular  Pulse normal rate, regularity and rhythm. S1 and S2 normal.  Lungs  Clear to auscultation bilateraly, without wheezes/crackles/rhonchi. Good air movement.  Skin  No rash/lesions/breakdown  Psychiatry  Alert and oriented to person, place, and time  Abdomen  Normoactive bowel sounds, abdomen soft, non-tender and obese without evidence of hernia. Back No CVA tenderness Genito Urinary  Vulva/vagina: Normal external female genitalia.  No lesions. No discharge or bleeding.  Bladder/urethra:  No lesions or masses, well  supported bladder  Vagina: normal  Cervix: Pale (consistent with prior radiation), no lesions.  Uterus: Small, mobile, no parametrial involvement or nodularity.  Adnexa: no palpable masses. Rectal  deferred Extremities  No bilateral cyanosis, clubbing or edema.   Thereasa Solo, MD  11/28/2018, 10:28 AM

## 2018-11-28 NOTE — Op Note (Signed)
OPERATIVE NOTE 11/28/18  Surgeon: Donaciano Eva   Assistants: Dr Lahoma Crocker (an MD assistant was necessary for tissue manipulation, management of robotic instrumentation, retraction and positioning due to the complexity of the case and hospital policies).   Anesthesia: General endotracheal anesthesia  ASA Class: 3   Pre-operative Diagnosis: endometrial cancer grade 3, morbid obesity (BMI 48kg/m2), poorly controlled type II DM, CAD, history of prior radiation for cervical cancer  Post-operative Diagnosis: same,   Operation: Robotic-assisted laparoscopic total hysterectomy with bilateral salpingoophorectomy, SLN biopsy, 22 modifier for extreme morbid obesity and medical comorbidities increasing duration of procedure by 1 hour and necessitating additional OR personnel and OR instrumentation for positioning and exposure of pelvic organs.   Surgeon: Donaciano Eva  Assistant Surgeon: Lahoma Crocker MD  Anesthesia: GET  Urine Output: 50cc  Operative Findings:  : 7cm uterus (grossly normal appearing), normal appearing tubes and ovaries. Unilateral mapping to the right (unable to perform complete left lymphadenectomy due to extreme obesity and patient comorbidities making prolonged operative time contraindicated). 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   Estimated Blood Loss:  less than 50 mL      Total IV Fluids: 800 ml         Specimens: uterus, cervix, bilateral tubes and ovaries, right common iliac SLN, right external iliac SLN.          Complications:  None; patient tolerated the procedure well.         Disposition: PACU - hemodynamically stable.  Procedure Details  The patient was seen in the Holding Room. The risks, benefits, complications, treatment options, and expected outcomes were discussed with the patient.  The patient concurred with the  proposed plan, giving informed consent.  The site of surgery properly noted/marked. The patient was identified as Kimberly Frazier and the procedure verified as a Robotic-assisted hysterectomy with bilateral salpingo oophorectomy with SLN biopsy. A Time Out was held and the above information confirmed.  After induction of anesthesia, the patient had an arterial line placed due to her poor underlying medical status (including morbid obesity, poorly controlled DM and coronary artery disease).  The arms were carefully padded and tucked bilaterally with sleds (this required additional OR personnel to assist in positioning due to the patient's obesity, and additional time for wrapping the arms and IV's and art line). The pannus was taped to the thighs to prevent shift during the procedure. The patient was draped and prepped in the usual sterile manner. Pt was placed in supine position after anesthesia and draped and prepped in the usual sterile manner. The abdominal drape was placed after the CholoraPrep had been allowed to dry for 3 minutes. The shoulders were stabilized with padded shoulder blocks applied to the acromium processes.  The patient was placed in the semi-lithotomy position in [place].  The perineum was prepped with Betadine. The patient was then prepped. Foley catheter was placed.  A sterile speculum was placed in the vagina.  The cervix was grasped with a single-tooth tenaculum. 2mg  total of ICG was injected into the cervical stroma at 2 and 9 o'clock with 1cc injected at a 1cm and 20mm depth (concentration 0.5mg /ml) in all locations. The cervix was dilated with Kennon Rounds dilators.  The ZUMI uterine manipulator with a medium colpotomizer ring was placed without difficulty.  A pneum occluder balloon was placed over the manipulator.  OG tube placement was confirmed and to suction.  Next, a 5 mm skin incision was made 1 cm below the subcostal margin in the midclavicular line.  The 5 mm Optiview port and  scope was used for direct entry.  Opening pressure was under 10 mm CO2.  The abdomen was insufflated and the findings were noted as above.   At this point and all points during the procedure, the patient's intra-abdominal pressure did not exceed 15 mmHg. Next, a 10 mm skin incision was made 61WE above the umbilicus and a right and left port was placed about 10 cm lateral to the robot port on the right and left side.  A fourth arm was placed in the left lower quadrant 2 cm above and superior and medial to the anterior superior iliac spine.  All ports were placed under direct visualization.  The patient was placed in steep Trendelenburg.  Bowel was folded away into the upper abdomen.  The sharp scissors were used to take down the omental adhesions into the umbilicus with care to reduce the omentum and not transect the omentum. The robot was docked in the normal manner.  The right and left peritoneum were opened parallel to the IP ligament to open the retroperitoneal spaces bilaterally. The SLN mapping was performed in bilateral pelvic basins. The para rectal and paravesical spaces were opened up entirely with careful dissection below the level of the ureters bilaterally and to the depth of the uterine artery origin in order to skeletonize the uterine "web" and ensure visualization of all parametrial channels. The para-aortic basins were carefully exposed and evaluated for isolated para-aortic SLN's. Lymphatic channels were identified travelling to the following visualized sentinel lymph node's: right common iliac and right external ilac. There was signicant retroperitoneal fibrosis bilaterally from her prior radiation for cervical cancer. This required additional time and dissection to separate landmark structures. These SLN's were separated from their surrounding lymphatic tissue, removed and sent for permanent pathology.  The hysterectomy was started after the round ligament on the right side was incised and the  retroperitoneum was entered and the pararectal space was developed.  The ureter was noted to be on the medial leaf of the broad ligament.  The peritoneum above the ureter was incised and stretched and the infundibulopelvic ligament was skeletonized, cauterized and cut.  The posterior peritoneum was taken down to the level of the KOH ring.  The anterior peritoneum was also taken down.  The bladder flap was created to the level of the KOH ring.  The uterine artery on the right side was skeletonized, cauterized and cut in the normal manner.  A similar procedure was performed on the left.  The colpotomy was made and the uterus, cervix, bilateral ovaries and tubes were amputated and delivered through the vagina.  During the hysterectomy, visualization was very limited due to interference from bowel loops due to intraperitoneal adiposity. Additional instrumentation was necessary to retract bowel to achieve exposure. Pedicles were inspected and excellent hemostasis was achieved.    The colpotomy at the vaginal cuff was closed with Vicryl on a CT1 needle in running manner.  Visualiztion of the bladder flap suggested that there was a thinned area of detrusor muscle at the trigone. This was imbricated/reinforced with interrupted 3-0vicryl suture. There was no breach of the bladder mucosa during the procedure. Irrigation was used and excellent hemostasis was achieved.  At this point in the procedure was completed.  Robotic instruments were removed under direct visulaization.  The robot was undocked. The 10 mm ports were closed  with Vicryl on a UR-5 needle and the fascia was closed with 0 Vicryl on a UR-5 needle.  The skin was closed with 4-0 Vicryl in a subcuticular manner.  Dermabond was applied.  Sponge, lap and needle counts correct x 2.  The patient was taken to the recovery room in stable condition.  The vagina was swabbed with  minimal bleeding noted.   All instrument and needle counts were correct x  3.   The  patient was transferred to the recovery room in a stable condition.  Donaciano Eva, MD

## 2018-11-28 NOTE — Anesthesia Procedure Notes (Signed)
Arterial Line Insertion Start/End6/30/2020 11:00 AM, 11/28/2018 11:06 AM Performed by: Catalina Gravel, MD, anesthesiologist  Patient location: OR. Preanesthetic checklist: patient identified, IV checked, site marked, risks and benefits discussed, surgical consent, monitors and equipment checked, pre-op evaluation, timeout performed and anesthesia consent Lidocaine 1% used for infiltration Left, radial was placed Catheter size: 20 Fr Hand hygiene performed  and maximum sterile barriers used   Attempts: 1 Procedure performed using ultrasound guided technique. Ultrasound Notes:anatomy identified, needle tip was noted to be adjacent to the nerve/plexus identified, no ultrasound evidence of intravascular and/or intraneural injection and image(s) printed for medical record Following insertion, dressing applied and Biopatch. Post procedure assessment: normal and unchanged  Patient tolerated the procedure well with no immediate complications.

## 2018-11-28 NOTE — Anesthesia Procedure Notes (Signed)
Procedure Name: Intubation Date/Time: 11/28/2018 10:52 AM Performed by: Cynda Familia, CRNA Pre-anesthesia Checklist: Patient identified, Emergency Drugs available, Suction available and Patient being monitored Patient Re-evaluated:Patient Re-evaluated prior to induction Oxygen Delivery Method: Circle System Utilized Preoxygenation: Pre-oxygenation with 100% oxygen Induction Type: IV induction, Rapid sequence and Cricoid Pressure applied Ventilation: Mask ventilation without difficulty Laryngoscope Size: Miller and 2 Grade View: Grade I Tube type: Oral Number of attempts: 1 Airway Equipment and Method: Stylet Placement Confirmation: ETT inserted through vocal cords under direct vision,  positive ETCO2 and breath sounds checked- equal and bilateral Secured at: 22 cm Tube secured with: Tape Dental Injury: Teeth and Oropharynx as per pre-operative assessment  Comments: Smooth IV induction Turk-- intubation AM CRNA atraumatic-- teeth and mouth as preop- many missing , chipped teeth-- unchanged with laryngoscopy-- bilat BS Gifford Shave

## 2018-11-28 NOTE — Transfer of Care (Signed)
Immediate Anesthesia Transfer of Care Note  Patient: Kimberly Frazier  Procedure(s) Performed: XI ROBOTIC ASSISTED TOTAL HYSTERECTOMY WITH BILATERAL SALPINGO OOPHORECTOMY (N/A ) SENTINEL NODE BIOPSY (N/A )  Patient Location: PACU  Anesthesia Type:General  Level of Consciousness: sedated  Airway & Oxygen Therapy: Patient Spontanous Breathing and Patient connected to face mask oxygen  Post-op Assessment: Report given to RN and Post -op Vital signs reviewed and stable  Post vital signs: Reviewed and stable  Last Vitals:  Vitals Value Taken Time  BP 128/83 11/28/18 1336  Temp    Pulse 83 11/28/18 1338  Resp 15 11/28/18 1338  SpO2 97 % 11/28/18 1338  Vitals shown include unvalidated device data.  Last Pain:  Vitals:   11/28/18 0806  TempSrc: Oral      Patients Stated Pain Goal: 4 (89/37/34 2876)  Complications: No apparent anesthesia complications

## 2018-11-29 ENCOUNTER — Encounter (HOSPITAL_COMMUNITY): Payer: Self-pay | Admitting: Gynecologic Oncology

## 2018-11-29 DIAGNOSIS — C541 Malignant neoplasm of endometrium: Secondary | ICD-10-CM | POA: Diagnosis not present

## 2018-11-29 LAB — CBC
HCT: 39.4 % (ref 36.0–46.0)
Hemoglobin: 12.9 g/dL (ref 12.0–15.0)
MCH: 28.4 pg (ref 26.0–34.0)
MCHC: 32.7 g/dL (ref 30.0–36.0)
MCV: 86.6 fL (ref 80.0–100.0)
Platelets: 221 10*3/uL (ref 150–400)
RBC: 4.55 MIL/uL (ref 3.87–5.11)
RDW: 14.6 % (ref 11.5–15.5)
WBC: 13.7 10*3/uL — ABNORMAL HIGH (ref 4.0–10.5)
nRBC: 0 % (ref 0.0–0.2)

## 2018-11-29 LAB — BASIC METABOLIC PANEL
Anion gap: 9 (ref 5–15)
BUN: 15 mg/dL (ref 6–20)
CO2: 25 mmol/L (ref 22–32)
Calcium: 8.7 mg/dL — ABNORMAL LOW (ref 8.9–10.3)
Chloride: 102 mmol/L (ref 98–111)
Creatinine, Ser: 0.9 mg/dL (ref 0.44–1.00)
GFR calc Af Amer: >60 mL/min (ref >60)
GFR calc non Af Amer: >60 mL/min (ref >60)
Glucose, Bld: 265 mg/dL — ABNORMAL HIGH (ref 70–99)
Potassium: 3.8 mmol/L (ref 3.5–5.1)
Sodium: 136 mmol/L (ref 135–145)

## 2018-11-29 LAB — GLUCOSE, CAPILLARY: Glucose-Capillary: 239 mg/dL — ABNORMAL HIGH (ref 70–99)

## 2018-11-29 NOTE — Discharge Summary (Signed)
Physician Discharge Summary  Patient ID: Kimberly Frazier MRN: [id number] DOB/AGE: [birth year] 58 y.o.  Admit date: 11/28/2018 Discharge date: 11/29/2018  Admission Diagnoses: Endometrial cancer [facility])  Discharge Diagnoses:  Principal Problem:   Endometrial cancer ([place]) Active Problems:   Diabetes mellitus type 2, uncontrolled, with complications ([place])   Severe obesity (BMI >= 40) ([facility])   CAD (coronary artery disease)   Hematuria   Retroperitoneal fibrosis   History of radiation therapy   Discharged Condition:  The patient is in good condition and stable for discharge.    Hospital Course: On 11/28/2018, the patient underwent the following: Procedure(s): XI ROBOTIC ASSISTED TOTAL HYSTERECTOMY WITH BILATERAL SALPINGO OOPHORECTOMY SENTINEL NODE BIOPSY.   The postoperative course was uneventful.  She was discharged to home on postoperative day 1 tolerating a regular diet, ambulating, voiding, pain controlled with po medications.  Consults: None  Significant Diagnostic Studies: None  Treatments: surgery: see above  Discharge Exam: Blood pressure (!) 168/100, pulse 93, temperature 98.4 F (36.9 C), temperature source Oral, resp. rate 16, height 5' 4"  (1.626 m), weight 275 lb 12.8 oz (125.1 kg), SpO2 97 %. General appearance: alert, cooperative and no distress Resp: clear to auscultation bilaterally Cardio: regular rate and rhythm, S1, S2 normal, no murmur, click, rub or gallop GI: soft, non-tender; bowel sounds normal; no masses,  no organomegaly and abdomen obese Extremities: extremities normal, atraumatic, no cyanosis or edema Incision/Wound: Lap sites to the abdomen with dermabond intact without active drainage  Disposition: Discharge disposition: 01-Home or Self Care       Discharge Instructions    Call MD for:  difficulty breathing, headache or visual disturbances   Complete by: As directed    Call MD for:  extreme fatigue   Complete by: As directed    Call MD for:  hives    Complete by: As directed    Call MD for:  persistant dizziness or light-headedness   Complete by: As directed    Call MD for:  persistant nausea and vomiting   Complete by: As directed    Call MD for:  redness, tenderness, or signs of infection (pain, swelling, redness, odor or green/yellow discharge around incision site)   Complete by: As directed    Call MD for:  severe uncontrolled pain   Complete by: As directed    Call MD for:  temperature >100.4   Complete by: As directed    Diet - low sodium heart healthy   Complete by: As directed    Driving Restrictions   Complete by: As directed    No driving for 1 week.  Do not take narcotics and drive.   Increase activity slowly   Complete by: As directed    Lifting restrictions   Complete by: As directed    No lifting greater than 10 lbs.   Sexual Activity Restrictions   Complete by: As directed    No sexual activity, nothing in the vagina, for 8 weeks.     Allergies as of 11/29/2018   No Known Allergies     Medication List    TAKE these medications   acetaminophen 500 MG tablet Commonly known as: TYLENOL Take 1,000 mg by mouth every 6 (six) hours as needed for moderate pain or headache.   amLODipine 5 MG tablet Commonly known as: NORVASC Take 1 tablet (5 mg total) by mouth daily.   aspirin EC 81 MG tablet Take 1 tablet (81 mg total) daily by mouth.   atorvastatin 40 MG tablet  Commonly known as: LIPITOR TAKE 1 TABLET DAILY BY MOUTH.   BD Pen Needle Nano U/F 32G X 4 MM Misc Generic drug: Insulin Pen Needle USE TWICE DAILY AS DIRECTED   Insulin Pen Needle 32G X 4 MM Misc Commonly known as: Engineer, maintenance Pen Needles Use as directed with insulin   carvedilol 25 MG tablet Commonly known as: COREG TAKE 1 TABLET (25 MG TOTAL) BY MOUTH 2 (TWO) TIMES DAILY.   Dulaglutide 1.5 MG/0.5ML Sopn Commonly known as: Trulicity Inject 1.5 mg into the skin once a week. What changed: when to take this   gabapentin 300 MG  capsule Commonly known as: NEURONTIN Take 1 capsule (300 mg total) by mouth at bedtime.   glucose blood test strip Commonly known as: True Metrix Blood Glucose Test Use as instructed   ibuprofen 600 MG tablet Commonly known as: ADVIL Take 1 tablet (600 mg total) by mouth every 6 (six) hours as needed.   Insulin Glargine 100 UNIT/ML Solostar Pen Commonly known as: Lantus SoloStar Inject 55 Units into the skin daily. What changed:   how much to take  when to take this   insulin lispro 100 UNIT/ML injection Commonly known as: HumaLOG Inject 0.24 mLs (24 Units total) into the skin 3 (three) times daily with meals. INJECT 28 UNITS INTO THE SKIN THREE TIMES DAILY BEFORE MEALS. What changed: additional instructions   isosorbide mononitrate 60 MG 24 hr tablet Commonly known as: IMDUR Take 1 tablet (60 mg total) by mouth daily.   nitroGLYCERIN 0.4 MG SL tablet Commonly known as: NITROSTAT Place 1 tab under tongue for chest pain.  May repeat after 5 minutes x 2.  DO NOT TAKE MORE THAN 3 TABS DURING AN EPISODE OF CHEST PAIN What changed:   how much to take  how to take this  when to take this  reasons to take this  additional instructions   potassium chloride 10 MEQ tablet Commonly known as: K-DUR Take 2 tablets (20 mEq total) by mouth 2 (two) times daily.   senna-docusate 8.6-50 MG tablet Commonly known as: Senokot-S Take 2 tablets by mouth at bedtime. For AFTER surgery, do not take if having loose stools   traMADol 50 MG tablet Commonly known as: ULTRAM Take 1 tablet (50 mg total) by mouth every 6 (six) hours as needed for severe pain. For AFTER surgery, do not take and drive   True Metrix Meter w/Device Kit Use as directed   TRUEplus Lancets 28G Misc Use as directted      Follow-up Information    Everitt Amber, MD Follow up on 12/20/2018.   Specialty: Gynecologic Oncology Why: at 1 pm at the [facility] information: [address] [place]  [place] [address] [phone]           Greater than thirty minutes were spend for face to face discharge instructions and discharge orders/summary in EPIC.   Signed: Dorothyann Gibbs 11/29/2018, 8:55 AM

## 2018-11-29 NOTE — Discharge Instructions (Signed)
11/28/2018  Activity: 1. Be up and out of the bed during the day.  Take a nap if needed.  You may walk up steps but be careful and use the hand rail.  Stair climbing will tire you more than you think, you may need to stop part way and rest.   2. No lifting or straining for 6 weeks.  3. No driving for 1 weeks.  Do Not drive if you are taking narcotic pain medicine.  4. Shower daily.  Use soap and water on your incision and pat dry; don't rub.   5. No sexual activity and nothing in the vagina for 8 weeks.  Medications:  - Take ibuprofen and tylenol first line for pain control. Take these regularly (every 6 hours) to decrease the build up of pain.  - If necessary, for severe pain not relieved by ibuprofen, take tramadol.  - While taking tramadol you should take sennakot every night to reduce the likelihood of constipation. If this causes diarrhea, stop its use.  Diet: 1. Low sodium Heart Healthy Diet is recommended.  2. It is safe to use a laxative if you have difficulty moving your bowels.   Wound Care: 1. Keep clean and dry.  Shower daily.  Reasons to call the Doctor:   Fever - Oral temperature greater than 100.4 degrees Fahrenheit  Foul-smelling vaginal discharge  Difficulty urinating  Nausea and vomiting  Increased pain at the site of the incision that is unrelieved with pain medicine.  Difficulty breathing with or without chest pain  New calf pain especially if only on one side  Sudden, continuing increased vaginal bleeding with or without clots.   Follow-up: 1. See Everitt Amber in 3-4 weeks.  Contacts: For questions or concerns you should contact:  Dr. Everitt Amber at [phone]  After hours and on week-ends call [phone] and ask to speak to the physician on call for Gynecologic Oncology  Tramadol tablets What is this medicine? TRAMADOL (TRA ma dole) is a pain reliever. It is used to treat moderate to severe pain in adults. This medicine may be used for  other purposes; ask your health care provider or pharmacist if you have questions. COMMON BRAND NAME(S): Ultram What should I tell my health care provider before I take this medicine? They need to know if you have any of these conditions:  brain tumor  depression  drug abuse or addiction  head injury  if you frequently drink alcohol containing drinks  kidney disease or trouble passing urine  liver disease  lung disease, asthma, or breathing problems  seizures or epilepsy  suicidal thoughts, plans, or attempt; a previous suicide attempt by you or a family member  an unusual or allergic reaction to tramadol, codeine, other medicines, foods, dyes, or preservatives  pregnant or trying to get pregnant  breast-feeding How should I use this medicine? Take this medicine by mouth with a full glass of water. Follow the directions on the prescription label. You can take it with or without food. If it upsets your stomach, take it with food. Do not take your medicine more often than directed. A special MedGuide will be given to you by the pharmacist with each prescription and refill. Be sure to read this information carefully each time. Talk to your pediatrician regarding the use of this medicine in children. Special care may be needed. Overdosage: If you think you have taken too much of this medicine contact a poison control center or emergency room at  once. NOTE: This medicine is only for you. Do not share this medicine with others. What if I miss a dose? If you miss a dose, take it as soon as you can. If it is almost time for your next dose, take only that dose. Do not take double or extra doses. What may interact with this medicine? Do not take this medication with any of the following medicines:  MAOIs like Carbex, Eldepryl, Marplan, Nardil, and Parnate This medicine may also interact with the following medications:  alcohol  antihistamines for allergy, cough and cold  certain  medicines for anxiety or sleep  certain medicines for depression like amitriptyline, fluoxetine, sertraline  certain medicines for migraine headache like almotriptan, eletriptan, frovatriptan, naratriptan, rizatriptan, sumatriptan, zolmitriptan  certain medicines for seizures like carbamazepine, oxcarbazepine, phenobarbital, primidone  certain medicines that treat or prevent blood clots like warfarin  digoxin  furazolidone  general anesthetics like halothane, isoflurane, methoxyflurane, propofol  linezolid  local anesthetics like lidocaine, pramoxine, tetracaine  medicines that relax muscles for surgery  other narcotic medicines for pain or cough  phenothiazines like chlorpromazine, mesoridazine, prochlorperazine, thioridazine  procarbazine This list may not describe all possible interactions. Give your health care provider a list of all the medicines, herbs, non-prescription drugs, or dietary supplements you use. Also tell them if you smoke, drink alcohol, or use illegal drugs. Some items may interact with your medicine. What should I watch for while using this medicine? Tell your doctor or health care provider if your pain does not go away, if it gets worse, or if you have new or a different type of pain. You may develop tolerance to the medicine. Tolerance means that you will need a higher dose of the medicine for pain relief. Tolerance is normal and is expected if you take this medicine for a long time. This medicine may cause serious skin reactions. They can happen weeks to months after starting the medicine. Contact your health care provider right away if you notice fevers or flu-like symptoms with a rash. The rash may be red or purple and then turn into blisters or peeling of the skin. Or, you might notice a red rash with swelling of the face, lips or lymph nodes in your neck or under your arms. Do not suddenly stop taking your medicine because you may develop a severe reaction.  Your body becomes used to the medicine. This does NOT mean you are addicted. Addiction is a behavior related to getting and using a drug for a non-medical reason. If you have pain, you have a medical reason to take pain medicine. Your doctor will tell you how much medicine to take. If your doctor wants you to stop the medicine, the dose will be slowly lowered over time to avoid any side effects. There are different types of narcotic medicines (opiates). If you take more than one type at the same time or if you are taking another medicine that also causes drowsiness, you may have more side effects. Give your health care provider a list of all medicines you use. Your doctor will tell you how much medicine to take. Do not take more medicine than directed. Call emergency for help if you have problems breathing or unusual sleepiness. You may get drowsy or dizzy. Do not drive, use machinery, or do anything that needs mental alertness until you know how this medicine affects you. Do not stand or sit up quickly, especially if you are an older patient. This reduces the risk of  dizzy or fainting spells. Alcohol can increase or decrease the effects of this medicine. Avoid alcoholic drinks. You may have constipation. Try to have a bowel movement at least every 2 to 3 days. If you do not have a bowel movement for 3 days, call your doctor or health care provider. Your mouth may get dry. Chewing sugarless gum or sucking hard candy, and drinking plenty of water may help. Contact your doctor if the problem does not go away or is severe. What side effects may I notice from receiving this medicine? Side effects that you should report to your doctor or health care professional as soon as possible:  allergic reactions like skin rash, itching or hives, swelling of the face, lips, or tongue  breathing problems  confusion  redness, blistering, peeling or loosening of the skin, including inside the mouth  seizures  signs  and symptoms of low blood pressure like dizziness; feeling faint or lightheaded, falls; unusually weak or tired  trouble passing urine or change in the amount of urine Side effects that usually do not require medical attention (report to your doctor or health care professional if they continue or are bothersome):  constipation  dry mouth  nausea, vomiting  tiredness This list may not describe all possible side effects. Call your doctor for medical advice about side effects. You may report side effects to FDA at 1-800-FDA-1088. Where should I keep my medicine? Keep out of the reach of children. This medicine may cause accidental overdose and death if it taken by other adults, children, or pets. Mix any unused medicine with a substance like cat litter or coffee grounds. Then throw the medicine away in a sealed container like a sealed bag or a coffee can with a lid. Do not use the medicine after the expiration date. Store at room temperature between 15 and 30 degrees C (59 and 86 degrees F). NOTE: This sheet is a summary. It may not cover all possible information. If you have questions about this medicine, talk to your doctor, pharmacist, or health care provider.  2020 Elsevier/Gold Standard (2018-08-25 15:56:48)

## 2018-11-30 ENCOUNTER — Other Ambulatory Visit: Payer: Self-pay

## 2018-11-30 NOTE — Telephone Encounter (Signed)
This patient was last seen 10/03/18, has no future appt scheduled. Please authorize fill for PASS if appropriate. This script will go to [facility]

## 2018-12-01 MED ORDER — TRULICITY 1.5 MG/0.5ML ~~LOC~~ SOAJ: 1.5000 mg | SUBCUTANEOUS | 2 refills | Status: DC

## 2018-12-04 ENCOUNTER — Ambulatory Visit (INDEPENDENT_AMBULATORY_CARE_PROVIDER_SITE_OTHER): Payer: Self-pay | Admitting: Internal Medicine

## 2018-12-04 ENCOUNTER — Telehealth: Payer: Self-pay | Admitting: Gynecologic Oncology

## 2018-12-04 ENCOUNTER — Other Ambulatory Visit: Payer: Self-pay

## 2018-12-04 ENCOUNTER — Encounter: Payer: Self-pay | Admitting: Internal Medicine

## 2018-12-04 VITALS — BP 122/84 | HR 98 | Temp 98.1°F | Ht 64.0 in | Wt 272.2 lb

## 2018-12-04 DIAGNOSIS — E1142 Type 2 diabetes mellitus with diabetic polyneuropathy: Secondary | ICD-10-CM

## 2018-12-04 DIAGNOSIS — IMO0002 Reserved for concepts with insufficient information to code with codable children: Secondary | ICD-10-CM

## 2018-12-04 DIAGNOSIS — E1165 Type 2 diabetes mellitus with hyperglycemia: Secondary | ICD-10-CM

## 2018-12-04 DIAGNOSIS — D3502 Benign neoplasm of left adrenal gland: Secondary | ICD-10-CM

## 2018-12-04 LAB — POTASSIUM: Potassium: 3.5 mEq/L (ref 3.5–5.1)

## 2018-12-04 LAB — GLUCOSE, POCT (MANUAL RESULT ENTRY): POC Glucose: 147 mg/dl — AB (ref 70–99)

## 2018-12-04 NOTE — Telephone Encounter (Signed)
Contacted patient to verify telephone visit for pre reg °

## 2018-12-04 NOTE — Progress Notes (Signed)
Name: Kimberly Frazier  MRN/ DOB: [id number], Dec 18, 1960   Age/ Sex: 58 y.o., female    PCP: Ladell Pier, MD   Reason for Endocrinology Evaluation: Type 2 Diabetes Mellitus     Date of Initial Endocrinology Visit: 12/04/2018     PATIENT IDENTIFIER: Kimberly Frazier is a 58 y.o. 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 The patient presented for initial endocrinology clinic visit on 12/04/2018 for consultative assistance with her diabetes management.    HPI: Kimberly Frazier was    Diagnosed with T2DM in > 10 yrs ago Prior Medications tried/Intolerance: Metformin - GI intolerance.  Currently checking blood sugars 2 x / day,  before breakfast and bedtime  Hypoglycemia episodes : no             Hemoglobin A1c has ranged from 7.9% in 2014, peaking at 13.2% in 2016. Patient required assistance for hypoglycemia: no Patient has required hospitalization within the last 1 year from hyper or hypoglycemia: no    Adrenal Adenoma History : This was initially discovered on abdominal MRI in 12/2017 there is a 1.7 cm left adrenal adenoma and again on a staging CT scan in 07/2018, it was measured at 2.0 cm .   Pheochromocytoma work up negative 09/2018 24-hr urine cortisol 30.5 mcg in 09/2018 We had multiple attempts at getting,    SUBJECTIVE:   During the last visit (10/06/2018): We increased trulicity, reduced lantus and humalog  Today (12/04/2018): Kimberly Frazier is here for a follow up on diabetes management. She checks her blood sugars 1-2 times daily, preprandial . The patient has not had hypoglycemic episodes since the last clinic visit. The pt did not bring her meter today. Otherwise, the patient has not required any recent emergency interventions for hypoglycemia but recently was hospitalized for hysterectomy.    HOME DIABETES REGIMEN: Lantus 54 units QHS Humalog 24 units TIDQAC  Trulicity 1.5 weekly (Saturday)    Statin: yes ACE-I/ARB: No    METER DOWNLOAD SUMMARY:did not bring  Yesterday 127 mg/dL     DIABETIC COMPLICATIONS: Microvascular complications:   Neuropathy  Denies: Variable GFR, retinopathy   Last eye exam: Completed 2018  Macrovascular complications:   CAD (awaiting CABG per pt)  Denies: PVD, CVA   PAST HISTORY: Past Medical History:  Past Medical History:  Diagnosis Date  . Adrenal adenoma, left   . Arthritis   . Cataract    Bilateral  . CKD (chronic kidney disease), stage II   . Coronary artery disease 07-28-2018 [facility]  currently due to no insurance   per cardiac cath 02-06-2015 (positive mild lateral ishcemia on stress test)--- dLAD 80%,  mLAD 40%,  mPDA 80% (small vessel),  ostial D1 70%,  CFx with lumial irregarlities-- medical management  . Depression   . Diabetic neuropathy ([place])   . History of Bell's palsy 07/2011   per pt residual facial pain on left side occasionally  . History of cancer of vagina 1999   per pt completed radiation and chemo  . History of sepsis 12/30/2017   positive blood culter fro E.coli  . Hyperlipidemia   . Hypertension   . Hypokalemia   . Insulin dependent type 2 diabetes mellitus, uncontrolled ([place])    followed by pcp---  A1c was 11.7 on 06-15-2018 in epic  . Myocardial infarction ([place])   . Nocturia   . Peripheral neuropathy   . PMB (postmenopausal bleeding)   .  Wears dentures    upper  . Wears glasses    Past Surgical History:  Past Surgical History:  Procedure Laterality Date  . BREAST BIOPSY  2012   benign  . CARDIAC CATHETERIZATION N/A 02/06/2015   Procedure: Left Heart Cath and Coronary Angiography;  Surgeon: Larey Dresser, MD;  Location: [facility];  Service: Cardiovascular;  Laterality: N/A;  . HYSTEROSCOPY W/D&C N/A 08/01/2018   Procedure: DILATATION AND CURETTAGE /HYSTEROSCOPY;  Surgeon: Mora Bellman, MD;  Location: [facility];  Service: Gynecology;   Laterality: N/A;  . ROBOTIC ASSISTED TOTAL HYSTERECTOMY WITH BILATERAL SALPINGO OOPHERECTOMY N/A 11/28/2018   Procedure: XI ROBOTIC ASSISTED TOTAL HYSTERECTOMY WITH BILATERAL SALPINGO OOPHORECTOMY;  Surgeon: Everitt Amber, MD;  Location: [facility];  Service: Gynecology;  Laterality: N/A;  . SENTINEL NODE BIOPSY N/A 11/28/2018   Procedure: SENTINEL NODE BIOPSY;  Surgeon: Everitt Amber, MD;  Location: [facility];  Service: Gynecology;  Laterality: N/A;  . UMBILICAL HERNIA REPAIR  child      Social History:  reports that she has never smoked. She has never used smokeless tobacco. She reports previous alcohol use. She reports previous drug use. Drug: Marijuana. Family History:  Family History  Problem Relation Age of Onset  . Heart disease Mother   . Hypertension Mother   . Diabetes Mother   . Cancer Father        hodgkins lymphoma  . Heart disease Sister        heart attack  . Diabetes Sister   . Cancer Other        parent  . Diabetes Other        parent  . Heart disease Other        parent  . Hyperlipidemia Other        parent  . Hypertension Other        parent  . Arthritis Other        parent  . Diabetes Maternal Aunt   . Diabetes Maternal Uncle   . Breast cancer Neg Hx      HOME MEDICATIONS: Allergies as of 12/04/2018   No Known Allergies     Medication List       Accurate as of December 04, 2018  3:16 PM. If you have any questions, ask your nurse or doctor.        acetaminophen 500 MG tablet Commonly known as: TYLENOL Take 1,000 mg by mouth every 6 (six) hours as needed for moderate pain or headache.   amLODipine 5 MG tablet Commonly known as: NORVASC Take 1 tablet (5 mg total) by mouth daily.   aspirin EC 81 MG tablet Take 1 tablet (81 mg total) daily by mouth.   atorvastatin 40 MG tablet Commonly known as: LIPITOR TAKE 1 TABLET DAILY BY MOUTH.   BD Pen Needle Nano U/F 32G X 4 MM Misc Generic drug: Insulin Pen Needle USE TWICE DAILY AS DIRECTED   Insulin Pen  Needle 32G X 4 MM Misc Commonly known as: Engineer, maintenance Pen Needles Use as directed with insulin   carvedilol 25 MG tablet Commonly known as: COREG TAKE 1 TABLET (25 MG TOTAL) BY MOUTH 2 (TWO) TIMES DAILY.   gabapentin 300 MG capsule Commonly known as: NEURONTIN Take 1 capsule (300 mg total) by mouth at bedtime.   glucose blood test strip Commonly known as: True Metrix Blood Glucose Test Use as instructed   ibuprofen 600 MG tablet Commonly known as: ADVIL Take 1 tablet (600  mg total) by mouth every 6 (six) hours as needed.   Insulin Glargine 100 UNIT/ML Solostar Pen Commonly known as: Lantus SoloStar Inject 55 Units into the skin daily. What changed:   how much to take  when to take this   insulin lispro 100 UNIT/ML injection Commonly known as: HumaLOG Inject 0.24 mLs (24 Units total) into the skin 3 (three) times daily with meals. INJECT 28 UNITS INTO THE SKIN THREE TIMES DAILY BEFORE MEALS. What changed: additional instructions   isosorbide mononitrate 60 MG 24 hr tablet Commonly known as: IMDUR Take 1 tablet (60 mg total) by mouth daily.   nitroGLYCERIN 0.4 MG SL tablet Commonly known as: NITROSTAT Place 1 tab under tongue for chest pain.  May repeat after 5 minutes x 2.  DO NOT TAKE MORE THAN 3 TABS DURING AN EPISODE OF CHEST PAIN What changed:   how much to take  how to take this  when to take this  reasons to take this  additional instructions   potassium chloride 10 MEQ tablet Commonly known as: K-DUR Take 2 tablets (20 mEq total) by mouth 2 (two) times daily.   senna-docusate 8.6-50 MG tablet Commonly known as: Senokot-S Take 2 tablets by mouth at bedtime. For AFTER surgery, do not take if having loose stools   traMADol 50 MG tablet Commonly known as: ULTRAM Take 1 tablet (50 mg total) by mouth every 6 (six) hours as needed for severe pain. For AFTER surgery, do not take and drive   True Metrix Meter w/Device Kit Use as directed   TRUEplus  Lancets 28G Misc Use as directted   Trulicity 1.5 MV/7.8IO Sopn Generic drug: Dulaglutide Inject 1.5 mg into the skin once a week.        ALLERGIES: No Known Allergies   REVIEW OF SYSTEMS: A comprehensive ROS was conducted with the patient and is negative except as per HPI and below:  Review of Systems  Constitutional: Negative for fever and malaise/fatigue.  HENT: Negative for congestion and sore throat.   Eyes: Positive for blurred vision. Negative for pain.  Respiratory: Negative for cough and shortness of breath.   Cardiovascular: Negative for chest pain and palpitations.  Gastrointestinal: Negative for diarrhea and nausea.  Genitourinary: Positive for frequency.  Skin: Negative.   Neurological: Positive for tingling. Negative for tremors.  Endo/Heme/Allergies: Positive for polydipsia.  Psychiatric/Behavioral: Positive for depression. The patient is not nervous/anxious.       OBJECTIVE:   VITAL SIGNS: BP 122/84 (BP Location: Left Arm, Patient Position: Sitting, Cuff Size: Large)   Pulse 98   Temp 98.1 F (36.7 C)   Ht 5' 4"  (1.626 m)   Wt 272 lb 3.2 oz (123.5 kg)   SpO2 98%   BMI 46.72 kg/m    PHYSICAL EXAM:  General: Pt appears well and is in NAD  Lungs: Clear with good BS bilat with no rales, rhonchi, or wheezes  Heart: RRR with normal S1 and S2 and no gallops; no murmurs; no rub  Abdomen: Normoactive bowel sounds, soft, nontender, without masses or organomegaly palpable  Extremities:  Lower extremities - No pretibial edema. No lesions.  Skin: Normal texture and temperature to palpation. No rash noted.  Neuro: MS is good with appropriate affect, pt is alert and Ox3    DM foot exam: deferred    DATA REVIEWED:  Lab Results  Component Value Date   HGBA1C 7.9 (H) 11/24/2018   HGBA1C 10.0 (H) 08/11/2018   HGBA1C 11.7 (A) 06/15/2018  Lab Results  Component Value Date   MICROALBUR 1.5 07/15/2015   LDLCALC 68 09/20/2018   CREATININE 0.90 11/29/2018    Lab Results  Component Value Date   MICRALBCREAT 1.4 07/15/2015    Lab Results  Component Value Date   CHOL 131 09/20/2018   HDL 38 (L) 09/20/2018   LDLCALC 68 09/20/2018   LDLDIRECT 93.0 07/15/2015   TRIG 123 09/20/2018   CHOLHDL 3.4 09/20/2018        ASSESSMENT / PLAN / RECOMMENDATIONS:   1) Type 2 Diabetes Mellitus, Poorly Controlled, With Neuropathic and Macrovascular complications - Most recent A1c of 7.9 %. Goal A1c < 7.0%.     Plan: GENERAL:  - Praised the pt on great glycemic control with an A1c trending from 10.0% to 7.9% , we did discuss the goal of  7.0% - She was encouraged to continue with lifestyle changes.  - We discussed the importance of having glucose data available to make proper management decisions.  - No changes will be made today   MEDICATIONS:  Trulicity 1.5 mg weekly (Saturday )  Lantus to 54 units daily   Humalog to 24 units TIDQAC  EDUCATION / INSTRUCTIONS:  BG monitoring instructions: Patient is instructed to check her blood sugars 4 times a day, before meals and bedtime.  Call [facility] if: BG persistently < 70 or > 300. . I reviewed the Rule of 15 for the treatment of hypoglycemia in detail with the patient. Literature supplied.    2) Left Adrenal Adenoma :  - 85 % of adrenal incidentaloma's are benign. Despite having an MRI in 2019 with a left adrenal adenoma size of 1.7 cm and a CT scan in 2020 at 2.0 cm , I am not sure I could say for sure that there's an increase in size, given 2 different imaging modalities  - My recommendations is to repeat CT with adrenal protocol in 03/2019 - Hormonal check up was negative for pheo and cushing. Have not been able to check Aldo due to hypokalemia.  - Repeat potassium today is normal, will proceed with aldo:renin ratio    F/u in 3 months     Signed electronically by: Mack Guise, MD  [facility]  [facility] [address]  [place], [place] [address] Phone: [phone] FAX: [phone]   CC: Ladell Pier, MD [place] [address] Phone: [phone]  Fax: [phone]    Return to Endocrinology clinic as below: Future Appointments  Date Time Provider [place]  12/05/2018  4:00 PM Everitt Amber, MD [facility] None  12/20/2018  1:00 PM Everitt Amber, MD [facility] None  03/06/2019 10:10 AM , Melanie Crazier, MD [facility] None

## 2018-12-04 NOTE — Patient Instructions (Signed)
-   Continue  Lantus at 54 units at bedtime - Continue Humalog at  24 unts with each meal  - Continue  Trulicity at 1.5 mg every Saturday      HOW TO TREAT LOW BLOOD SUGARS (Blood sugar LESS THAN 70 MG/DL)  Please follow the RULE OF 15 for the treatment of hypoglycemia treatment (when your (blood sugars are less than 70 mg/dL)    STEP 1: Take 15 grams of carbohydrates when your blood sugar is low, which includes:   3-4 GLUCOSE TABS  OR  3-4 OZ OF JUICE OR REGULAR SODA OR  ONE TUBE OF GLUCOSE GEL     STEP 2: RECHECK blood sugar in 15 MINUTES STEP 3: If your blood sugar is still low at the 15 minute recheck --> then, go back to STEP 1 and treat AGAIN with another 15 grams of carbohydrates.

## 2018-12-05 ENCOUNTER — Inpatient Hospital Stay: Payer: Medicaid Other | Attending: Gynecologic Oncology | Admitting: Gynecologic Oncology

## 2018-12-05 ENCOUNTER — Encounter: Payer: Self-pay | Admitting: Gynecologic Oncology

## 2018-12-05 DIAGNOSIS — Z90710 Acquired absence of both cervix and uterus: Secondary | ICD-10-CM | POA: Insufficient documentation

## 2018-12-05 DIAGNOSIS — N393 Stress incontinence (female) (male): Secondary | ICD-10-CM | POA: Insufficient documentation

## 2018-12-05 DIAGNOSIS — Z7189 Other specified counseling: Secondary | ICD-10-CM

## 2018-12-05 DIAGNOSIS — C541 Malignant neoplasm of endometrium: Secondary | ICD-10-CM | POA: Insufficient documentation

## 2018-12-05 DIAGNOSIS — Z90722 Acquired absence of ovaries, bilateral: Secondary | ICD-10-CM | POA: Insufficient documentation

## 2018-12-05 NOTE — Progress Notes (Unsigned)
Follow-up Note: Gyn-Onc  Consult was initially requested by Dr. Elly Modena for the evaluation of Kimberly Frazier 58 y.o. female  CC:  Chief Complaint  Patient presents with  . endometrial cancer    Assessment/Plan:  [name]  is a 58 y.o.  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 High/intermediate risk factors for recurrence. Recommendation is typically for vaginal brachytherapy +/- chemotherapy to reduce risk for local recurrence in accordance with NCCN guidelines. However, given her prior radiation, this is not an option.  I discussed we could offer her chemotherapy alone and that this is associated with a decreased recurrence rate (25-12%) but is also associated with significant morbidity. It would be my recommendation to proceed with close follow-up. She will consider further.   HPI: [name] is a 58 year old P2 who is seen in consultation at the request of Dr Elly Modena for grade 3 endometrial cancer.  The patient reports a history of light vaginal spotting for approximately 1 year.  Since January 2020 she developed heavy vaginal bleeding and this prompted her to see Dr. Elly Modena who performed a transvaginal ultrasound on June 20, 2018.  This revealed a uterus measuring 7.6 x 3.1 x 4.9 cm, there was a hypoechoic masslike area in the cervix measuring 1.6 x 1.4 x 1.9 cm.  The endometrium was thickened at 14 mm.  It contained an endometrial mass measuring 2.6 cm in greatest dimension.  She was taken to the operating room on August 01, 2018 with Dr. Elly Modena.  She performed the hysteroscopy D&C.  Intraoperative findings were significant for an 8-week size uterus, diffuse proliferative endometrium, but no mention of an endometrial mass.  Pathology from the endometrial curetting  showed high-grade endometrial adenocarcinoma with features consistent with serous carcinoma.  Of note the patient has a history of a Pap smear in January 2020 that was normal with no high risk HPV detected.  The patient's medical history is complex.  She has a remote history of a gynecologic malignancy in 1999.  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  She describes treatment with primary brachii therapy (she describes being in bed in the hospital for 5 days with radiation internally).  She is unsure if she received chemotherapy.  She did not receive surgery for this.  She received follow-up for approximately a year after treatment and then discontinued.  Her medical history is also significant for coronary artery disease.  The patient had what she describes as a light myocardial infarction in 2016.  This was managed with a coronary angiography, however no stent was placed.  She did not receive ongoing cardiology evaluation as the patient reports that she did not have health insurance.  She was initially treated with medical therapy.  Her only medical therapy at the time of this diagnosis with aspirin 81 mg daily.  Patient has a history of type 2 diabetes mellitus for which she takes insulin.  She reports poor blood glucose control.  She reports not particularly being compliant with her diet.  She has sequelae of diabetic neuropathy in hands and feet and chronic kidney disease.  She has hypertension and hypercholesterolemia.  The patient is morbidly obese with a BMI of 48 kg/m.  Her prior abdominal surgery includes 1 prior cesarean section and, as a  child, a midline laparotomy for a hernia repair.  Her family history is significant for father who had a diagnosis of chins lymphoma, and a brother had a history of colon cancer.  Her CA 125 was elevated to 12% in March, 2020.  She was seen by cardiology who felt that she was moderate/high risk for perioperative cardiac events but that  she was maximally optimized for a hysterectomy and did not advocate for CABG beforehand.  CT abd/pelvis on 08/17/18 showed no apparent extrauterine disease. Small ground glass opacities were seen in the lower lungs -recommended 6 month follow-up.  She has been optimizing her blood glucose control.   Interval Hx:  The patient underwent robotic assisted total hysterectomy, BSO, SLN biopsy on 11/28/18. Final pathology revealed a high grade serous carcinoma of the endometrium with inner half (5 of 24m) invasion and no LVSI or cervical involvment. SLN's were negative as were the adnexa. She was assigned a stage IA grade 3 serous carcinoma.  Postoperatively she has done well.   Current Meds:  Outpatient Encounter Medications as of 12/05/2018  Medication Sig  . acetaminophen (TYLENOL) 500 MG tablet Take 1,000 mg by mouth every 6 (six) hours as needed for moderate pain or headache.  .Marland KitchenamLODipine (NORVASC) 5 MG tablet Take 1 tablet (5 mg total) by mouth daily.  .Marland Kitchenaspirin EC 81 MG tablet Take 1 tablet (81 mg total) daily by mouth.  .Marland Kitchenatorvastatin (LIPITOR) 40 MG tablet TAKE 1 TABLET DAILY BY MOUTH. (Patient taking differently: Take 40 mg by mouth daily. )  . BD PEN NEEDLE NANO U/F 32G X 4 MM MISC USE TWICE DAILY AS DIRECTED  . Blood Glucose Monitoring Suppl (TRUE METRIX METER) w/Device KIT Use as directed  . carvedilol (COREG) 25 MG tablet TAKE 1 TABLET (25 MG TOTAL) BY MOUTH 2 (TWO) TIMES DAILY.  . Dulaglutide (TRULICITY) 1.5 MZO/1.0RUSOPN Inject 1.5 mg into the skin once a week.  . gabapentin (NEURONTIN) 300 MG capsule Take 1 capsule (300 mg total) by mouth at bedtime.  .Marland Kitchenglucose blood (TRUE METRIX BLOOD GLUCOSE TEST) test strip Use as instructed (Patient not taking: Reported on 11/20/2018)  . ibuprofen (ADVIL,MOTRIN) 600 MG tablet Take 1 tablet (600 mg total) by mouth every 6 (six) hours as needed.  . Insulin Glargine (LANTUS SOLOSTAR) 100 UNIT/ML Solostar Pen Inject 55 Units into the skin daily.  (Patient taking differently: Inject 54 Units into the skin at bedtime. )  . insulin lispro (HUMALOG) 100 UNIT/ML injection Inject 0.24 mLs (24 Units total) into the skin 3 (three) times daily with meals. INJECT 28 UNITS INTO THE SKIN THREE TIMES DAILY BEFORE MEALS. (Patient taking differently: Inject 24 Units into the skin 3 (three) times daily with meals. )  . Insulin Pen Needle (ULTICARE MICRO PEN NEEDLES) 32G X 4 MM MISC Use as directed with insulin  . isosorbide mononitrate (IMDUR) 60 MG 24 hr tablet Take 1 tablet (60 mg total) by mouth daily.  . nitroGLYCERIN (NITROSTAT) 0.4 MG SL tablet Place 1 tab under tongue for chest pain.  May repeat after 5 minutes x 2.  DO NOT TAKE MORE THAN 3 TABS DURING AN EPISODE OF CHEST PAIN (Patient taking differently: Place 0.4 mg under the tongue every 5 (five) minutes as needed for chest pain. DO NOT TAKE MORE THAN 3 TABS DURING AN EPISODE OF CHEST PAIN)  . potassium chloride (K-DUR) 10 MEQ tablet Take 2 tablets (20 mEq total) by mouth 2 (two) times daily.  .Marland Kitchensenna-docusate (  SENOKOT-S) 8.6-50 MG tablet Take 2 tablets by mouth at bedtime. For AFTER surgery, do not take if having loose stools  . traMADol (ULTRAM) 50 MG tablet Take 1 tablet (50 mg total) by mouth every 6 (six) hours as needed for severe pain. For AFTER surgery, do not take and drive (Patient not taking: Reported on 11/20/2018)  . TRUEPLUS LANCETS 28G MISC Use as directted   No facility-administered encounter medications on file as of 12/05/2018.     Allergy: No Known Allergies  Social Hx:   Social History   Socioeconomic History  . Marital status: Single    Spouse name: Not on file  . Number of children: 2  . Years of education: 51  . Highest education level: Not on file  Occupational History  . Occupation: retired  Scientific laboratory technician  . Financial resource strain: Not on file  . Food insecurity    Worry: Often true    Inability: Sometimes true  . Transportation needs    Medical: No     Non-medical: No  Tobacco Use  . Smoking status: Never Smoker  . Smokeless tobacco: Never Used  Substance and Sexual Activity  . Alcohol use: Not Currently  . Drug use: Not Currently    Types: Marijuana    Comment: 07-28-2018  per pt last smoked 11/21/2018  . Sexual activity: Yes  Lifestyle  . Physical activity    Days per week: 0 days    Minutes per session: 0 min  . Stress: Only a little  Relationships  . Social connections    Talks on phone: More than three times a week    Gets together: More than three times a week    Attends religious service: Never    Active member of club or organization: No    Attends meetings of clubs or organizations: Never    Relationship status: Living with partner  . Intimate partner violence    Fear of current or ex partner: No    Emotionally abused: No    Physically abused: No    Forced sexual activity: No  Other Topics Concern  . Not on file  Social History Narrative   Regular exercise-no   No caffeine use    Past Surgical Hx:  Past Surgical History:  Procedure Laterality Date  . BREAST BIOPSY  2012   benign  . CARDIAC CATHETERIZATION N/A 02/06/2015   Procedure: Left Heart Cath and Coronary Angiography;  Surgeon: Larey Dresser, MD;  Location: [facility];  Service: Cardiovascular;  Laterality: N/A;  . HYSTEROSCOPY W/D&C N/A 08/01/2018   Procedure: DILATATION AND CURETTAGE /HYSTEROSCOPY;  Surgeon: Mora Bellman, MD;  Location: [facility];  Service: Gynecology;  Laterality: N/A;  . ROBOTIC ASSISTED TOTAL HYSTERECTOMY WITH BILATERAL SALPINGO OOPHERECTOMY N/A 11/28/2018   Procedure: XI ROBOTIC ASSISTED TOTAL HYSTERECTOMY WITH BILATERAL SALPINGO OOPHORECTOMY;  Surgeon: Everitt Amber, MD;  Location: [facility];  Service: Gynecology;  Laterality: N/A;  . SENTINEL NODE BIOPSY N/A 11/28/2018   Procedure: SENTINEL NODE BIOPSY;  Surgeon: Everitt Amber, MD;  Location: [facility];  Service: Gynecology;  Laterality: N/A;  . UMBILICAL HERNIA  REPAIR  child    Past Medical Hx:  Past Medical History:  Diagnosis Date  . Adrenal adenoma, left   . Arthritis   . Cataract    Bilateral  . CKD (chronic kidney disease), stage II   . Coronary artery disease 07-28-2018 [facility]  currently due to no insurance  per cardiac cath 02-06-2015 (positive mild lateral ishcemia on stress test)--- dLAD 80%,  mLAD 40%,  mPDA 80% (small vessel),  ostial D1 70%,  CFx with lumial irregarlities-- medical management  . Depression   . Diabetic neuropathy ([place])   . History of Bell's palsy 07/2011   per pt residual facial pain on left side occasionally  . History of cancer of vagina 1999   per pt completed radiation and chemo  . History of sepsis 12/30/2017   positive blood culter fro E.coli  . Hyperlipidemia   . Hypertension   . Hypokalemia   . Insulin dependent type 2 diabetes mellitus, uncontrolled ([place])    followed by pcp---  A1c was 11.7 on 06-15-2018 in epic  . Myocardial infarction ([place])   . Nocturia   . Peripheral neuropathy   . PMB (postmenopausal bleeding)   . Wears dentures    upper  . Wears glasses     Past Gynecological History:  See HPI No LMP recorded. Patient is postmenopausal.  Family Hx:  Family History  Problem Relation Age of Onset  . Heart disease Mother   . Hypertension Mother   . Diabetes Mother   . Cancer Father        hodgkins lymphoma  . Heart disease Sister        heart attack  . Diabetes Sister   . Cancer Other        parent  . Diabetes Other        parent  . Heart disease Other        parent  . Hyperlipidemia Other        parent  . Hypertension Other        parent  . Arthritis Other        parent  . Diabetes Maternal Aunt   . Diabetes Maternal Uncle   . Breast cancer Neg Hx     Review of Systems:  Constitutional  Feels well,    ENT Normal appearing ears and nares bilaterally Skin/Breast  No rash, sores, jaundice, itching, dryness Cardiovascular  No  chest pain, shortness of breath, or edema  Pulmonary  No cough or wheeze.  Gastro Intestinal  No nausea, vomitting, or diarrhoea. No bright red blood per rectum, no abdominal pain, change in bowel movement, or constipation.  Genito Urinary  No frequency, urgency, dysuria, + postmenopausal bleeding Musculo Skeletal  No myalgia, arthralgia, joint swelling or pain  Neurologic  No weakness, numbness, change in gait,  Psychology  No depression, anxiety, insomnia.   Vitals:  There were no vitals taken for this visit.  Physical Exam: Deferred due to phone visit.  I discussed the assessment and treatment plan with the patient. The patient was provided with an opportunity to ask questions and all were answered. The patient agreed with the plan and demonstrated an understanding of the instructions.   The patient was advised to call back or see an in-person evaluation if the symptoms worsen or if the condition fails to improve as anticipated.   I provided  minutes of non face-to-face telephone visit time during this encounter, and > 50% was spent counseling as documented under my assessment & plan.    Thereasa Solo, MD  12/05/2018, 6:20 PM

## 2018-12-06 ENCOUNTER — Encounter: Payer: Self-pay | Admitting: Oncology

## 2018-12-06 NOTE — Progress Notes (Signed)
Requested Her-2 testing on Accession: [id number] per Dr. Denman George with [facility] and Suanne Marker in [facility].

## 2018-12-08 ENCOUNTER — Other Ambulatory Visit: Payer: Self-pay

## 2018-12-11 LAB — ALDOSTERONE + RENIN ACTIVITY W/O RATIO: ALDOSTERONE: 15.7 ng/dL (ref 0.0–30.0)

## 2018-12-18 ENCOUNTER — Telehealth: Payer: Self-pay | Admitting: Internal Medicine

## 2018-12-18 NOTE — Telephone Encounter (Signed)
Discussed results of Aldo: renin ratio with the patient on 12/18/18 at 12:15 pm    Her ratio is high at 76.5 this confirms positivity for screening. The next step is to confirm the results with saline infusion test .     Pt in agreement to this.    Will set her with infusion suite.     Abby Nena Jordan, MD  [facility]  [facility] [address] [place], [place] [address] Phone: [phone] FAX: [phone]

## 2018-12-18 NOTE — Telephone Encounter (Signed)
I have not heard of this before,is this something that you can do or  could you point me in the right direction for this test?

## 2018-12-19 NOTE — Telephone Encounter (Signed)
I have no idea what a infusion test is, or where to get it.  I have bags of 250cc saline that I can infuse if needed.

## 2018-12-19 NOTE — Telephone Encounter (Signed)
So I found out that [facility] has an infusion center. But thank you for responding.

## 2018-12-20 ENCOUNTER — Inpatient Hospital Stay: Payer: Medicaid Other | Admitting: Gynecologic Oncology

## 2018-12-22 ENCOUNTER — Encounter: Payer: Self-pay | Admitting: Gynecologic Oncology

## 2018-12-22 ENCOUNTER — Other Ambulatory Visit: Payer: Self-pay

## 2018-12-22 ENCOUNTER — Inpatient Hospital Stay (HOSPITAL_BASED_OUTPATIENT_CLINIC_OR_DEPARTMENT_OTHER): Payer: Medicaid Other | Admitting: Gynecologic Oncology

## 2018-12-22 VITALS — BP 122/89 | HR 94 | Temp 98.0°F | Resp 18 | Ht 64.0 in | Wt 269.6 lb

## 2018-12-22 DIAGNOSIS — Z90710 Acquired absence of both cervix and uterus: Secondary | ICD-10-CM

## 2018-12-22 DIAGNOSIS — Z90722 Acquired absence of ovaries, bilateral: Secondary | ICD-10-CM | POA: Diagnosis not present

## 2018-12-22 DIAGNOSIS — Z7189 Other specified counseling: Secondary | ICD-10-CM

## 2018-12-22 DIAGNOSIS — C541 Malignant neoplasm of endometrium: Secondary | ICD-10-CM | POA: Diagnosis not present

## 2018-12-22 DIAGNOSIS — N393 Stress incontinence (female) (male): Secondary | ICD-10-CM

## 2018-12-22 NOTE — Patient Instructions (Signed)
Please notify Dr Denman George at phone number [phone] if you notice vaginal bleeding, new pelvic or abdominal pains, bloating, feeling full easy, or a change in bladder or bowel function.   Please contact Dr Serita Grit office (at [phone]) in August to request an appointment with her for October, 2020.  Dr Denman George has scheduled you to see Dr Matilde Sprang to evaluate the leakage of urine.

## 2018-12-22 NOTE — Progress Notes (Signed)
Follow-up Note: Gyn-Onc  Consult was initially requested by Dr. Elly Modena for the evaluation of Kimberly Frazier 58 y.o. female  CC:  Chief Complaint  Patient presents with  . endometrial cancer    follow-up    Assessment/Plan:  [name]  is a 58 y.o.  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 High/intermediate risk factors for recurrence. Recommendation is typically for vaginal brachytherapy +/- chemotherapy to reduce risk for local recurrence in accordance with NCCN guidelines. However, given her prior radiation, this is not an option.  I discussed we could offer her chemotherapy alone and that this is associated with a decreased recurrence rate (25-12%) but is also associated with significant morbidity. It would be my recommendation to proceed with close follow-up. She will consider further.  Urinary incontinence - no evidence of vesicovaginal fistula. Recommend follow-up with Dr Matilde Sprang for evaluation and treatment.   I will see her back for follow-up in 3 months.   HPI: [name] is a 58 year old P2 who is seen in consultation at the request of Dr Elly Modena for grade 3 endometrial cancer.  The patient reports a history of light vaginal spotting for approximately 1 year.  Since January 2020 she developed heavy vaginal bleeding and this prompted her to see Dr. Elly Modena who performed a transvaginal ultrasound on June 20, 2018.  This revealed a uterus measuring 7.6 x 3.1 x 4.9 cm, there was a hypoechoic masslike area in the cervix measuring 1.6 x 1.4 x 1.9 cm.  The endometrium was thickened at 14 mm.  It contained an endometrial mass measuring 2.6 cm in greatest dimension.  She was taken to the operating room on August 01, 2018 with Dr. Elly Modena.  She performed the  hysteroscopy D&C.  Intraoperative findings were significant for an 8-week size uterus, diffuse proliferative endometrium, but no mention of an endometrial mass.  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  Of note the patient has a history of a Pap smear in January 2020 that was normal with no high risk HPV detected.  The patient's medical history is complex.  She has a remote history of a gynecologic malignancy in 1999.  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  She describes treatment with primary brachii therapy (she describes being in bed in the hospital for 5 days with radiation internally).  She is unsure if she received chemotherapy.  She did not receive surgery for this.  She received follow-up for approximately a year after treatment and then discontinued.  Her medical history is also significant for coronary artery disease.  The patient had what she describes as a light myocardial infarction in 2016.  This was managed with a coronary angiography, however no stent was placed.  She did not receive ongoing cardiology evaluation as the patient reports that she did not have health insurance.  She was initially treated with medical therapy.  Her only medical therapy at the time of this diagnosis with aspirin 81 mg daily.  Patient has a history of type 2 diabetes mellitus for which she takes insulin.  She reports poor blood glucose control.  She reports not particularly being compliant with her diet.  She has sequelae of diabetic neuropathy in hands and feet and  chronic kidney disease.  She has hypertension and hypercholesterolemia.  The patient is morbidly obese with a BMI of 48 kg/m.  Her prior abdominal surgery includes 1 prior cesarean section and, as a child, a midline laparotomy for a hernia repair.  Her family history is significant for father who had a diagnosis of chins lymphoma,  and a brother had a history of colon cancer.  Her CA 125 was elevated to 12% in March, 2020.  She was seen by cardiology who felt that she was moderate/high risk for perioperative cardiac events but that she was maximally optimized for a hysterectomy and did not advocate for CABG beforehand.  CT abd/pelvis on 08/17/18 showed no apparent extrauterine disease. Small ground glass opacities were seen in the lower lungs -recommended 6 month follow-up.  She has been optimizing her blood glucose control.   Interval Hx:  The patient underwent robotic assisted total hysterectomy, BSO, SLN biopsy on 11/28/18. Final pathology revealed a high grade serous carcinoma of the endometrium with inner half (5 of 58m) invasion and no LVSI or cervical involvment. SLN's were negative as were the adnexa. She was assigned a stage IA grade 3 serous carcinoma. Her 2 equivocal, and MSI stable, MMR normal.   Due to the high grade nature of her pathology she met criteria for adjuvant therapy with either chemotherapy, vaginal brachytherapy or both. However, she is not a candidate for additional radiation due to her prior radiation for cervical cancer, and has medical comorbidities which would make delivery of chemotherapy challenging.   Postoperatively she has done well. She does have stress urinary incontinence that emerged that is significant.   Current Meds:  Outpatient Encounter Medications as of 12/22/2018  Medication Sig  . acetaminophen (TYLENOL) 500 MG tablet Take 1,000 mg by mouth every 6 (six) hours as needed for moderate pain or headache.  .Marland KitchenamLODipine (NORVASC) 5 MG tablet Take 1 tablet (5 mg total) by mouth daily.  .Marland Kitchenaspirin EC 81 MG tablet Take 1 tablet (81 mg total) daily by mouth.  .Marland Kitchenatorvastatin (LIPITOR) 40 MG tablet TAKE 1 TABLET DAILY BY MOUTH. (Patient taking differently: Take 40 mg by mouth daily. )  . BD PEN NEEDLE NANO U/F 32G X 4 MM MISC USE TWICE DAILY AS DIRECTED  . Blood Glucose Monitoring Suppl  (TRUE METRIX METER) w/Device KIT Use as directed  . carvedilol (COREG) 25 MG tablet TAKE 1 TABLET (25 MG TOTAL) BY MOUTH 2 (TWO) TIMES DAILY.  . Dulaglutide (TRULICITY) 1.5 MGN/0.0BBSOPN Inject 1.5 mg into the skin once a week.  . gabapentin (NEURONTIN) 300 MG capsule Take 1 capsule (300 mg total) by mouth at bedtime.  .Marland Kitchenglucose blood (TRUE METRIX BLOOD GLUCOSE TEST) test strip Use as instructed (Patient not taking: Reported on 11/20/2018)  . ibuprofen (ADVIL,MOTRIN) 600 MG tablet Take 1 tablet (600 mg total) by mouth every 6 (six) hours as needed.  . Insulin Glargine (LANTUS SOLOSTAR) 100 UNIT/ML Solostar Pen Inject 55 Units into the skin daily. (Patient taking differently: Inject 54 Units into the skin at bedtime. )  . insulin lispro (HUMALOG) 100 UNIT/ML injection Inject 0.24 mLs (24 Units total) into the skin 3 (three) times daily with meals. INJECT 28 UNITS INTO THE SKIN THREE TIMES DAILY BEFORE MEALS. (Patient taking differently: Inject 24 Units into the skin 3 (three) times daily with meals. )  . Insulin Pen Needle (ULTICARE MICRO PEN NEEDLES) 32G X 4 MM MISC Use as directed with insulin  . isosorbide mononitrate (IMDUR)  60 MG 24 hr tablet Take 1 tablet (60 mg total) by mouth daily.  . nitroGLYCERIN (NITROSTAT) 0.4 MG SL tablet Place 1 tab under tongue for chest pain.  May repeat after 5 minutes x 2.  DO NOT TAKE MORE THAN 3 TABS DURING AN EPISODE OF CHEST PAIN (Patient taking differently: Place 0.4 mg under the tongue every 5 (five) minutes as needed for chest pain. DO NOT TAKE MORE THAN 3 TABS DURING AN EPISODE OF CHEST PAIN)  . potassium chloride (K-DUR) 10 MEQ tablet Take 2 tablets (20 mEq total) by mouth 2 (two) times daily.  Marland Kitchen senna-docusate (SENOKOT-S) 8.6-50 MG tablet Take 2 tablets by mouth at bedtime. For AFTER surgery, do not take if having loose stools  . traMADol (ULTRAM) 50 MG tablet Take 1 tablet (50 mg total) by mouth every 6 (six) hours as needed for severe pain. For AFTER  surgery, do not take and drive (Patient not taking: Reported on 11/20/2018)  . TRUEPLUS LANCETS 28G MISC Use as directted   No facility-administered encounter medications on file as of 12/22/2018.     Allergy: No Known Allergies  Social Hx:   Social History   Socioeconomic History  . Marital status: Single    Spouse name: Not on file  . Number of children: 2  . Years of education: 70  . Highest education level: Not on file  Occupational History  . Occupation: retired  Scientific laboratory technician  . Financial resource strain: Not on file  . Food insecurity    Worry: Often true    Inability: Sometimes true  . Transportation needs    Medical: No    Non-medical: No  Tobacco Use  . Smoking status: Never Smoker  . Smokeless tobacco: Never Used  Substance and Sexual Activity  . Alcohol use: Not Currently  . Drug use: Not Currently    Types: Marijuana    Comment: 07-28-2018  per pt last smoked 11/21/2018  . Sexual activity: Yes  Lifestyle  . Physical activity    Days per week: 0 days    Minutes per session: 0 min  . Stress: Only a little  Relationships  . Social connections    Talks on phone: More than three times a week    Gets together: More than three times a week    Attends religious service: Never    Active member of club or organization: No    Attends meetings of clubs or organizations: Never    Relationship status: Living with partner  . Intimate partner violence    Fear of current or ex partner: No    Emotionally abused: No    Physically abused: No    Forced sexual activity: No  Other Topics Concern  . Not on file  Social History Narrative   Regular exercise-no   No caffeine use    Past Surgical Hx:  Past Surgical History:  Procedure Laterality Date  . BREAST BIOPSY  2012   benign  . CARDIAC CATHETERIZATION N/A 02/06/2015   Procedure: Left Heart Cath and Coronary Angiography;  Surgeon: Larey Dresser, MD;  Location: [facility];  Service: Cardiovascular;   Laterality: N/A;  . HYSTEROSCOPY W/D&C N/A 08/01/2018   Procedure: DILATATION AND CURETTAGE /HYSTEROSCOPY;  Surgeon: Mora Bellman, MD;  Location: [facility];  Service: Gynecology;  Laterality: N/A;  . ROBOTIC ASSISTED TOTAL HYSTERECTOMY WITH BILATERAL SALPINGO OOPHERECTOMY N/A 11/28/2018   Procedure: XI ROBOTIC ASSISTED TOTAL HYSTERECTOMY WITH BILATERAL SALPINGO OOPHORECTOMY;  Surgeon:  Everitt Amber, MD;  Location: [facility];  Service: Gynecology;  Laterality: N/A;  . SENTINEL NODE BIOPSY N/A 11/28/2018   Procedure: SENTINEL NODE BIOPSY;  Surgeon: Everitt Amber, MD;  Location: [facility];  Service: Gynecology;  Laterality: N/A;  . UMBILICAL HERNIA REPAIR  child    Past Medical Hx:  Past Medical History:  Diagnosis Date  . Adrenal adenoma, left   . Arthritis   . Cataract    Bilateral  . CKD (chronic kidney disease), stage II   . Coronary artery disease 07-28-2018 [facility]  currently due to no insurance   per cardiac cath 02-06-2015 (positive mild lateral ishcemia on stress test)--- dLAD 80%,  mLAD 40%,  mPDA 80% (small vessel),  ostial D1 70%,  CFx with lumial irregarlities-- medical management  . Depression   . Diabetic neuropathy ([place])   . History of Bell's palsy 07/2011   per pt residual facial pain on left side occasionally  . History of cancer of vagina 1999   per pt completed radiation and chemo  . History of sepsis 12/30/2017   positive blood culter fro E.coli  . Hyperlipidemia   . Hypertension   . Hypokalemia   . Insulin dependent type 2 diabetes mellitus, uncontrolled ([place])    followed by pcp---  A1c was 11.7 on 06-15-2018 in epic  . Myocardial infarction ([place])   . Nocturia   . Peripheral neuropathy   . PMB (postmenopausal bleeding)   . Wears dentures    upper  . Wears glasses     Past Gynecological History:  See HPI No LMP recorded. Patient is postmenopausal.  Family Hx:  Family History  Problem Relation Age of Onset  . Heart  disease Mother   . Hypertension Mother   . Diabetes Mother   . Cancer Father        hodgkins lymphoma  . Heart disease Sister        heart attack  . Diabetes Sister   . Cancer Other        parent  . Diabetes Other        parent  . Heart disease Other        parent  . Hyperlipidemia Other        parent  . Hypertension Other        parent  . Arthritis Other        parent  . Diabetes Maternal Aunt   . Diabetes Maternal Uncle   . Breast cancer Neg Hx     Review of Systems:  Constitutional  Feels well,    ENT Normal appearing ears and nares bilaterally Skin/Breast  No rash, sores, jaundice, itching, dryness Cardiovascular  No chest pain, shortness of breath, or edema  Pulmonary  No cough or wheeze.  Gastro Intestinal  No nausea, vomitting, or diarrhoea. No bright red blood per rectum, no abdominal pain, change in bowel movement, or constipation.  Genito Urinary  No frequency, urgency, dysuria, + postmenopausal bleeding, + incontinence Musculo Skeletal  No myalgia, arthralgia, joint swelling or pain  Neurologic  No weakness, numbness, change in gait,  Psychology  No depression, anxiety, insomnia.   Vitals:  Blood pressure 122/89, pulse 94, temperature 98 F (36.7 C), resp. rate 18, height 5' 4"  (1.626 m), weight 269 lb 9.6 oz (122.3 kg), SpO2 100 %.  Physical Exam: WD in NAD Neck  Supple NROM, without any enlargements.  Lymph Node Survey No cervical supraclavicular or inguinal adenopathy Cardiovascular  Pulse  normal rate, regularity and rhythm. S1 and S2 normal.  Lungs  Clear to auscultation bilateraly, without wheezes/crackles/rhonchi. Good air movement.  Skin  No rash/lesions/breakdown  Psychiatry  Alert and oriented to person, place, and time  Abdomen  Normoactive bowel sounds, abdomen soft, non-tender and obese without evidence of hernia. Well healed incisions Back No CVA tenderness Genito Urinary  Vulva/vagina: Normal external female genitalia.  No  lesions. No discharge or bleeding.  Bladder/urethra:  No lesions or masses, + increased urethral mobility  Vagina: well healed vaginal cuff, no fluid in vault  Cervix: Normal appearing, no lesions. Rectal  deferred  Extremities   No bilateral cyanosis, clubbing or edema.   30 minutes of direct face to face counseling time was spent with the patient. This included discussion about prognosis, therapy recommendations and postoperative side effects and are beyond the scope of routine postoperative care.   Thereasa Solo, MD  12/22/2018, 1:43 PM

## 2018-12-25 ENCOUNTER — Telehealth: Payer: Self-pay | Admitting: Oncology

## 2018-12-25 NOTE — Telephone Encounter (Signed)
Kimberly Frazier with appointment to see Dr. Matilde Sprang on 01/12/19 at 8:30 am.  She verbalized understanding and agreement.

## 2018-12-26 MED FILL — ?POTASSIUM CL ER 10 MEQ TAB: 10 MEQ | 30 days supply | Qty: 120 | Fill #1

## 2018-12-26 MED FILL — ?HUMALOG 100 UNITS/ML VIAL: 100 | 23 days supply | Qty: 20 | Fill #2

## 2018-12-27 ENCOUNTER — Telehealth: Payer: Self-pay | Admitting: Internal Medicine

## 2018-12-27 NOTE — Telephone Encounter (Signed)
Patient would like to talk to Dr Wynetta Emery nurse regarding a Medical issue  (she didn't want to said  the medical issue). Thank you

## 2018-12-29 MED FILL — TRUE METRIX TEST STRIP: 100 days supply | Qty: 100 | Fill #1

## 2018-12-29 NOTE — Telephone Encounter (Signed)
Returned pt call. Pt didn't answer lvm asking pt to give me a call at her earliest convenience

## 2019-01-02 ENCOUNTER — Other Ambulatory Visit: Payer: Self-pay | Admitting: Internal Medicine

## 2019-01-02 DIAGNOSIS — I2510 Atherosclerotic heart disease of native coronary artery without angina pectoris: Secondary | ICD-10-CM

## 2019-01-02 MED FILL — ?AMLODIPINE BESYLATE 5MG TA: 5 | 90 days supply | Qty: 90 | Fill #1

## 2019-01-02 MED FILL — ISOSORBIDE MN ER 60 MG TAB: 60 | 30 days supply | Qty: 30 | Fill #3

## 2019-01-03 ENCOUNTER — Telehealth: Payer: Self-pay | Admitting: Oncology

## 2019-01-03 MED FILL — ?ATORVASTATIN 40MG TABLET: 40 | 30 days supply | Qty: 30 | Fill #0

## 2019-01-03 NOTE — Telephone Encounter (Signed)
Faxed office note and labs to [facility] at [phone] for referral for appointment on 01/12/19.Marland Kitchen

## 2019-01-16 ENCOUNTER — Other Ambulatory Visit: Payer: Self-pay | Admitting: Internal Medicine

## 2019-01-16 DIAGNOSIS — I2510 Atherosclerotic heart disease of native coronary artery without angina pectoris: Secondary | ICD-10-CM

## 2019-01-16 DIAGNOSIS — I10 Essential (primary) hypertension: Secondary | ICD-10-CM

## 2019-01-16 MED FILL — CEPHALEXIN 500 MG CAPSULE: 500 | 7 days supply | Qty: 21 | Fill #0

## 2019-01-16 MED FILL — CARVEDILOL 25 MG TABLET: 25 | 30 days supply | Qty: 60 | Fill #0

## 2019-01-16 MED FILL — ISOSORBIDE MN ER 60 MG TAB: 60 | 30 days supply | Qty: 30 | Fill #3

## 2019-01-16 MED FILL — AMLODIPINE BESYLATE 5 MG TA: 5 | 90 days supply | Qty: 90 | Fill #1

## 2019-01-17 ENCOUNTER — Telehealth: Payer: Self-pay | Admitting: *Deleted

## 2019-01-17 NOTE — Telephone Encounter (Signed)
Patient called and scheduled her follow up appt for October

## 2019-01-22 ENCOUNTER — Telehealth: Payer: Self-pay

## 2019-01-22 NOTE — Telephone Encounter (Signed)
Spoke with Laverne at the Short stay center and she informed that an order must be placed first then I can call and schedule. Order must have rate, how many bags,etc.

## 2019-01-23 NOTE — Telephone Encounter (Signed)
Pt scheduled for 01/30/19 @11 :10

## 2019-01-25 ENCOUNTER — Other Ambulatory Visit: Payer: Self-pay

## 2019-01-29 LAB — HM DIABETES EYE EXAM

## 2019-01-29 MED FILL — PREDNISOLONE AC 1% EYE DROP: 1 | 25 days supply | Qty: 5 | Fill #0

## 2019-01-29 MED FILL — OFLOXACIN 0.3% EYE DROPS: 0.3 | 25 days supply | Qty: 5 | Fill #0

## 2019-01-30 ENCOUNTER — Ambulatory Visit: Payer: Medicaid Other | Admitting: Internal Medicine

## 2019-01-30 NOTE — Progress Notes (Deleted)
Name: Kimberly Frazier  MRN/ DOB: [id number], 1960-09-08   Age/ Sex: 58 y.o., female    PCP: Ladell Pier, MD   Reason for Endocrinology Evaluation: Type 2 Diabetes Mellitus     Date of Initial Endocrinology Visit: 01/30/2019     PATIENT IDENTIFIER: Kimberly Frazier is a 58 y.o. 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 The patient presented for initial endocrinology clinic visit on 01/30/2019 for consultative assistance with her diabetes management.    HPI: Kimberly Frazier was    Diagnosed with T2DM in > 10 yrs ago Prior Medications tried/Intolerance: Metformin - GI intolerance.  Currently checking blood sugars 2 x / day,  before breakfast and bedtime  Hypoglycemia episodes : no             Hemoglobin A1c has ranged from 7.9% in 2014, peaking at 13.2% in 2016. Patient required assistance for hypoglycemia: no Patient has required hospitalization within the last 1 year from hyper or hypoglycemia: no    Adrenal Adenoma History : This was initially discovered on abdominal MRI in 12/2017 there is a 1.7 cm left adrenal adenoma and again on a staging CT scan in 07/2018, it was measured at 2.0 cm .   Pheochromocytoma work up negative 09/2018 24-hr urine cortisol 30.5 mcg in 09/2018  [place] : renin ratio 76   SUBJECTIVE:   During the last visit (12/04/2018): We continued trulicity, lantus and humalog  Today (01/30/2019): Kimberly Frazier is here for a follow up on diabetes management. She checks her blood sugars 1-2 times daily, preprandial . The patient has not had hypoglycemic episodes since the last clinic visit. The pt did not bring her meter today. Otherwise, the patient has not required any recent emergency interventions for hypoglycemia but recently was hospitalized for hysterectomy.    HOME DIABETES REGIMEN: Lantus 54 units QHS Humalog 24 units TIDQAC  Trulicity 1.5 weekly (Saturday)   Statin: yes ACE-I/ARB: No     METER DOWNLOAD SUMMARY:did not bring  Yesterday 127 mg/dL     DIABETIC COMPLICATIONS: Microvascular complications:   Neuropathy  Denies: Variable GFR, retinopathy   Last eye exam: Completed 2018  Macrovascular complications:   CAD (awaiting CABG per pt)  Denies: PVD, CVA   PAST HISTORY: Past Medical History:  Past Medical History:  Diagnosis Date  . Adrenal adenoma, left   . Arthritis   . Cataract    Bilateral  . CKD (chronic kidney disease), stage II   . Coronary artery disease 07-28-2018 followed [facility])  currently due to no insurance   per cardiac cath 02-06-2015 (positive mild lateral ishcemia on stress test)--- dLAD 80%,  mLAD 40%,  mPDA 80% (small vessel),  ostial D1 70%,  CFx with lumial irregarlities-- medical management  . Depression   . Diabetic neuropathy ([place])   . History of Bell's palsy 07/2011   per pt residual facial pain on left side occasionally  . History of cancer of vagina 1999   per pt completed radiation and chemo  . History of sepsis 12/30/2017   positive blood culter fro E.coli  . Hyperlipidemia   . Hypertension   . Hypokalemia   . Insulin dependent type 2 diabetes mellitus, uncontrolled ([place])    followed by pcp---  A1c was 11.7 on 06-15-2018 in epic  . Myocardial infarction ([place])   . Nocturia   . Peripheral neuropathy   . PMB (postmenopausal bleeding)   .  Wears dentures    upper  . Wears glasses    Past Surgical History:  Past Surgical History:  Procedure Laterality Date  . BREAST BIOPSY  2012   benign  . CARDIAC CATHETERIZATION N/A 02/06/2015   Procedure: Left Heart Cath and Coronary Angiography;  Surgeon: Larey Dresser, MD;  Location: [facility];  Service: Cardiovascular;  Laterality: N/A;  . HYSTEROSCOPY W/D&C N/A 08/01/2018   Procedure: DILATATION AND CURETTAGE /HYSTEROSCOPY;  Surgeon: Mora Bellman, MD;  Location: [facility];  Service: Gynecology;  Laterality: N/A;  .  ROBOTIC ASSISTED TOTAL HYSTERECTOMY WITH BILATERAL SALPINGO OOPHERECTOMY N/A 11/28/2018   Procedure: XI ROBOTIC ASSISTED TOTAL HYSTERECTOMY WITH BILATERAL SALPINGO OOPHORECTOMY;  Surgeon: Everitt Amber, MD;  Location: [facility];  Service: Gynecology;  Laterality: N/A;  . SENTINEL NODE BIOPSY N/A 11/28/2018   Procedure: SENTINEL NODE BIOPSY;  Surgeon: Everitt Amber, MD;  Location: [facility];  Service: Gynecology;  Laterality: N/A;  . UMBILICAL HERNIA REPAIR  child      Social History:  reports that she has never smoked. She has never used smokeless tobacco. She reports previous alcohol use. She reports previous drug use. Drug: Marijuana. Family History:  Family History  Problem Relation Age of Onset  . Heart disease Mother   . Hypertension Mother   . Diabetes Mother   . Cancer Father        hodgkins lymphoma  . Heart disease Sister        heart attack  . Diabetes Sister   . Cancer Other        parent  . Diabetes Other        parent  . Heart disease Other        parent  . Hyperlipidemia Other        parent  . Hypertension Other        parent  . Arthritis Other        parent  . Diabetes Maternal Aunt   . Diabetes Maternal Uncle   . Breast cancer Neg Hx      HOME MEDICATIONS: Allergies as of 01/30/2019   No Known Allergies     Medication List       Accurate as of January 30, 2019  7:49 AM. If you have any questions, ask your nurse or doctor.        acetaminophen 500 MG tablet Commonly known as: TYLENOL Take 1,000 mg by mouth every 6 (six) hours as needed for moderate pain or headache.   amLODipine 5 MG tablet Commonly known as: NORVASC Take 1 tablet (5 mg total) by mouth daily.   aspirin EC 81 MG tablet Take 1 tablet (81 mg total) daily by mouth.   atorvastatin 40 MG tablet Commonly known as: LIPITOR TAKE 1 TABLET DAILY BY MOUTH.   BD Pen Needle Nano U/F 32G X 4 MM Misc Generic drug: Insulin Pen Needle USE TWICE DAILY AS DIRECTED   Insulin Pen Needle 32G X 4 MM  Misc Commonly known as: Engineer, maintenance Pen Needles Use as directed with insulin   carvedilol 25 MG tablet Commonly known as: COREG Take 1 tablet (25 mg total) by mouth 2 (two) times daily. Needs to make appointment.   gabapentin 300 MG capsule Commonly known as: NEURONTIN Take 1 capsule (300 mg total) by mouth at bedtime.   glucose blood test strip Commonly known as: True Metrix Blood Glucose Test Use as instructed   ibuprofen 600 MG tablet Commonly known as: ADVIL  Take 1 tablet (600 mg total) by mouth every 6 (six) hours as needed.   Insulin Glargine 100 UNIT/ML Solostar Pen Commonly known as: Lantus SoloStar Inject 55 Units into the skin daily. What changed:   how much to take  when to take this   insulin lispro 100 UNIT/ML injection Commonly known as: HumaLOG Inject 0.24 mLs (24 Units total) into the skin 3 (three) times daily with meals. INJECT 28 UNITS INTO THE SKIN THREE TIMES DAILY BEFORE MEALS. What changed: additional instructions   isosorbide mononitrate 60 MG 24 hr tablet Commonly known as: IMDUR Take 1 tablet (60 mg total) by mouth daily.   nitroGLYCERIN 0.4 MG SL tablet Commonly known as: NITROSTAT Place 1 tab under tongue for chest pain.  May repeat after 5 minutes x 2.  DO NOT TAKE MORE THAN 3 TABS DURING AN EPISODE OF CHEST PAIN What changed:   how much to take  how to take this  when to take this  reasons to take this  additional instructions   potassium chloride 10 MEQ tablet Commonly known as: K-DUR Take 2 tablets (20 mEq total) by mouth 2 (two) times daily.   senna-docusate 8.6-50 MG tablet Commonly known as: Senokot-S Take 2 tablets by mouth at bedtime. For AFTER surgery, do not take if having loose stools   traMADol 50 MG tablet Commonly known as: ULTRAM Take 1 tablet (50 mg total) by mouth every 6 (six) hours as needed for severe pain. For AFTER surgery, do not take and drive   True Metrix Meter w/Device Kit Use as directed    TRUEplus Lancets 28G Misc Use as directted   Trulicity 1.5 GM/0.1UU Sopn Generic drug: Dulaglutide Inject 1.5 mg into the skin once a week.        ALLERGIES: No Known Allergies   REVIEW OF SYSTEMS: A comprehensive ROS was conducted with the patient and is negative except as per HPI and below:  Review of Systems  Constitutional: Negative for fever and malaise/fatigue.  HENT: Negative for congestion and sore throat.   Eyes: Positive for blurred vision. Negative for pain.  Respiratory: Negative for cough and shortness of breath.   Cardiovascular: Negative for chest pain and palpitations.  Gastrointestinal: Negative for diarrhea and nausea.  Genitourinary: Positive for frequency.  Skin: Negative.   Neurological: Positive for tingling. Negative for tremors.  Endo/Heme/Allergies: Positive for polydipsia.  Psychiatric/Behavioral: Positive for depression. The patient is not nervous/anxious.       OBJECTIVE:   VITAL SIGNS: There were no vitals taken for this visit.   PHYSICAL EXAM:  General: Pt appears well and is in NAD  Lungs: Clear with good BS bilat with no rales, rhonchi, or wheezes  Heart: RRR with normal S1 and S2 and no gallops; no murmurs; no rub  Abdomen: Normoactive bowel sounds, soft, nontender, without masses or organomegaly palpable  Extremities:  Lower extremities - No pretibial edema. No lesions.  Skin: Normal texture and temperature to palpation. No rash noted.  Neuro: MS is good with appropriate affect, pt is alert and Ox3    DM foot exam: deferred    DATA REVIEWED:  Lab Results  Component Value Date   HGBA1C 7.9 (H) 11/24/2018   HGBA1C 10.0 (H) 08/11/2018   HGBA1C 11.7 (A) 06/15/2018   Lab Results  Component Value Date   MICROALBUR 1.5 07/15/2015   LDLCALC 68 09/20/2018   CREATININE 0.90 11/29/2018   Lab Results  Component Value Date   MICRALBCREAT 1.4 07/15/2015  Lab Results  Component Value Date   CHOL 131 09/20/2018   HDL 38 (L)  09/20/2018   LDLCALC 68 09/20/2018   LDLDIRECT 93.0 07/15/2015   TRIG 123 09/20/2018   CHOLHDL 3.4 09/20/2018        ASSESSMENT / PLAN / RECOMMENDATIONS:   1) Type 2 Diabetes Mellitus, Poorly Controlled, With Neuropathic and Macrovascular complications - Most recent A1c of 7.9 %. Goal A1c < 7.0%.     Plan: GENERAL:  - Praised the pt on great glycemic control with an A1c trending from 10.0% to 7.9% , we did discuss the goal of  7.0% - She was encouraged to continue with lifestyle changes.  - We discussed the importance of having glucose data available to make proper management decisions.  - No changes will be made today   MEDICATIONS:  Trulicity 1.5 mg weekly (Saturday )  Lantus to 54 units daily   Humalog to 24 units TIDQAC  EDUCATION / INSTRUCTIONS:  BG monitoring instructions: Patient is instructed to check her blood sugars 4 times a day, before meals and bedtime.  Call [facility] if: BG persistently < 70 or > 300. . I reviewed the Rule of 15 for the treatment of hypoglycemia in detail with the patient. Literature supplied.    2) Left Adrenal Adenoma :  - 85 % of adrenal incidentaloma's are benign. Despite having an MRI in 2019 with a left adrenal adenoma size of 1.7 cm and a CT scan in 2020 at 2.0 cm , I am not sure I could say for sure that there's an increase in size, given 2 different imaging modalities  - My recommendations is to repeat CT with adrenal protocol in 03/2019 - Hormonal check up was negative for pheo and cushing.  - She had a positive aldo: renin ratio and will proceed with oral salt loading     F/u in 3 months     Signed electronically by: Mack Guise, MD  [facility]  [facility] [address] [place],  [address] Phone: [phone] FAX: [phone]   CC: Ladell Pier, MD [place] [address] Phone: [phone]  Fax: [phone]     Return to Endocrinology clinic as below: Future Appointments  Date Time Provider [place]  01/30/2019 11:10 AM Linkin Vizzini, Melanie Crazier, MD [facility] None  03/02/2019  1:15 PM Everitt Amber, MD [facility] None  03/06/2019 10:10 AM Ellysa Parrack, Melanie Crazier, MD [facility] None

## 2019-02-13 MED FILL — OFLOXACIN 0.3% EYE DROPS: 0.3 | 25 days supply | Qty: 5 | Fill #0

## 2019-02-13 MED FILL — PREDNISOLONE AC 1% EYE DROP: 1 | 25 days supply | Qty: 5 | Fill #0

## 2019-02-15 ENCOUNTER — Telehealth (INDEPENDENT_AMBULATORY_CARE_PROVIDER_SITE_OTHER): Payer: Medicaid Other | Admitting: Cardiology

## 2019-02-15 ENCOUNTER — Encounter: Payer: Self-pay | Admitting: Cardiology

## 2019-02-15 VITALS — BP 124/80 | HR 92 | Ht 64.0 in | Wt 267.0 lb

## 2019-02-15 DIAGNOSIS — I10 Essential (primary) hypertension: Secondary | ICD-10-CM

## 2019-02-15 DIAGNOSIS — Z7189 Other specified counseling: Secondary | ICD-10-CM

## 2019-02-15 DIAGNOSIS — I25118 Atherosclerotic heart disease of native coronary artery with other forms of angina pectoris: Secondary | ICD-10-CM | POA: Diagnosis not present

## 2019-02-15 DIAGNOSIS — E7800 Pure hypercholesterolemia, unspecified: Secondary | ICD-10-CM | POA: Diagnosis not present

## 2019-02-15 NOTE — Patient Instructions (Signed)
Medication Instructions:  Your Physician recommend you continue on your current medication as directed.    If you need a refill on your cardiac medications before your next appointment, please call your pharmacy.   Lab work: None  Testing/Procedures: None  Follow-Up: At [facility], you and your health needs are our priority.  As part of our continuing mission to provide you with exceptional heart care, we have created designated Provider Care Teams.  These Care Teams include your primary Cardiologist (physician) and Advanced Practice Providers (APPs -  Physician Assistants and Nurse Practitioners) who all work together to provide you with the care you need, when you need it. You will need a follow up appointment in 6 months.  Please call our office 2 months in advance to schedule this appointment.  You may see Buford Dresser, MD or one of the following Advanced Practice Providers on your designated Care Team:   Kerin Ransom, PA-C Roby Lofts, [place] . Sande Rives, PA-C

## 2019-02-15 NOTE — Progress Notes (Signed)
Virtual Visit via Telephone Note   This visit type was conducted due to national recommendations for restrictions regarding the COVID-19 Pandemic (e.g. social distancing) in an effort to limit this patient's exposure and mitigate transmission in our community.  Due to her co-morbid illnesses, this patient is at least at moderate risk for complications without adequate follow up.  This format is felt to be most appropriate for this patient at this time.  The patient did not have access to video technology/had technical difficulties with video requiring transitioning to audio format only (telephone).  All issues noted in this document were discussed and addressed.  No physical exam could be performed with this format.  Please refer to the patient's chart for her  consent to telehealth for [facility].   Date:  02/15/2019   ID:  [name], DOB 07/10/1960, MRN [id number]  Patient Location: Home Provider Location: Office  PCP:  Ladell Pier, MD  Cardiologist:  Buford Dresser, MD  Electrophysiologist:  None   Evaluation Performed:  Follow-Up Visit  Chief Complaint:  Follow up  History of Present Illness:    Kimberly Frazier is a 58 y.o. female with CAD (see history results) who is seen virtually for follow up.  The patient does not have symptoms concerning for COVID-19 infection (fever, chills, cough, or new shortness of breath).   Today: Did well with hysterectomy. No issues. Rarely has chest heaviness, goes away with sitting down. Has not used nitroglycerin. Recently lost partner of 30 years last week, which has been very stressful for her. Offered my condolences. She has a lot of family support.   Tolerating amlodipine, aspirin, atorvastatin, carvedilol, isosorbide. Checks BP at home, reviewed with her. Usually 184-037/54-360, rarely diastolic higher. Reviewed contraindications for NSAIDs and CAD, she is not taking. No routine exercise, has been nervous about Covid. Reviewed  recommendations for exercise.   Deniesshortness of breath at rest or with normal exertion. No PND, orthopnea, LE edema or unexpected weight gain. No syncope or palpitations.   Past Medical History:  Diagnosis Date   Adrenal adenoma, left    Arthritis    Cataract    Bilateral   CKD (chronic kidney disease), stage II    Coronary artery disease 07-28-2018 followed [facility]cp([facility])  currently due to no insurance   per cardiac cath 02-06-2015 (positive mild lateral ishcemia on stress test)--- dLAD 80%,  mLAD 40%,  mPDA 80% (small vessel),  ostial D1 70%,  CFx with lumial irregarlities-- medical management   Depression    Diabetic neuropathy ([place])    History of Bell's palsy 07/2011   per pt residual facial pain on left side occasionally   History of cancer of vagina 1999   per pt completed radiation and chemo   History of sepsis 12/30/2017   positive blood culter fro E.coli   Hyperlipidemia    Hypertension    Hypokalemia    Insulin dependent type 2 diabetes mellitus, uncontrolled ([place])    followed by pcp---  A1c was 11.7 on 06-15-2018 in epic   Myocardial infarction ([place])    Nocturia    Peripheral neuropathy    PMB (postmenopausal bleeding)    Wears dentures    upper   Wears glasses    Past Surgical History:  Procedure Laterality Date   BREAST BIOPSY  2012   benign   CARDIAC CATHETERIZATION N/A 02/06/2015   Procedure: Left Heart Cath and Coronary Angiography;  Surgeon: Larey Dresser, MD;  Location: [facility];  Service: Cardiovascular;  Laterality: N/A;   HYSTEROSCOPY W/D&C N/A 08/01/2018   Procedure: DILATATION AND CURETTAGE /HYSTEROSCOPY;  Surgeon: Mora Bellman, MD;  Location: [facility];  Service: Gynecology;  Laterality: N/A;   ROBOTIC ASSISTED TOTAL HYSTERECTOMY WITH BILATERAL SALPINGO OOPHERECTOMY N/A 11/28/2018   Procedure: XI ROBOTIC ASSISTED TOTAL HYSTERECTOMY WITH BILATERAL SALPINGO OOPHORECTOMY;  Surgeon:  Everitt Amber, MD;  [facility];  Service: Gynecology;  Laterality: N/A;   SENTINEL NODE BIOPSY N/A 11/28/2018   Procedure: SENTINEL NODE BIOPSY;  Surgeon: Everitt Amber, MD;  Location: [facility];  Service: Gynecology;  Laterality: N/A;   UMBILICAL HERNIA REPAIR  child     Current Meds  Medication Sig   acetaminophen (TYLENOL) 500 MG tablet Take 1,000 mg by mouth every 6 (six) hours as needed for moderate pain or headache.   amLODipine (NORVASC) 5 MG tablet Take 1 tablet (5 mg total) by mouth daily.   aspirin EC 81 MG tablet Take 1 tablet (81 mg total) daily by mouth.   atorvastatin (LIPITOR) 40 MG tablet TAKE 1 TABLET DAILY BY MOUTH.   BD PEN NEEDLE NANO U/F 32G X 4 MM MISC USE TWICE DAILY AS DIRECTED   Blood Glucose Monitoring Suppl (TRUE METRIX METER) w/Device KIT Use as directed   carvedilol (COREG) 25 MG tablet Take 1 tablet (25 mg total) by mouth 2 (two) times daily. Needs to make appointment.   Dulaglutide (TRULICITY) 1.5 VZ/5.6LO SOPN Inject 1.5 mg into the skin once a week.   gabapentin (NEURONTIN) 300 MG capsule Take 1 capsule (300 mg total) by mouth at bedtime.   glucose blood (TRUE METRIX BLOOD GLUCOSE TEST) test strip Use as instructed   ibuprofen (ADVIL,MOTRIN) 600 MG tablet Take 1 tablet (600 mg total) by mouth every 6 (six) hours as needed.   Insulin Glargine (LANTUS SOLOSTAR) 100 UNIT/ML Solostar Pen Inject 55 Units into the skin daily. (Patient taking differently: Inject 54 Units into the skin at bedtime. )   insulin lispro (HUMALOG) 100 UNIT/ML injection Inject 0.24 mLs (24 Units total) into the skin 3 (three) times daily with meals. INJECT 28 UNITS INTO THE SKIN THREE TIMES DAILY BEFORE MEALS. (Patient taking differently: Inject 24 Units into the skin 3 (three) times daily with meals. )   Insulin Pen Needle (ULTICARE MICRO PEN NEEDLES) 32G X 4 MM MISC Use as directed with insulin   isosorbide mononitrate (IMDUR) 60 MG 24 hr tablet Take 1 tablet (60 mg total) by  mouth daily.   nitroGLYCERIN (NITROSTAT) 0.4 MG SL tablet Place 1 tab under tongue for chest pain.  May repeat after 5 minutes x 2.  DO NOT TAKE MORE THAN 3 TABS DURING AN EPISODE OF CHEST PAIN (Patient taking differently: Place 0.4 mg under the tongue every 5 (five) minutes as needed for chest pain. DO NOT TAKE MORE THAN 3 TABS DURING AN EPISODE OF CHEST PAIN)   potassium chloride (K-DUR) 10 MEQ tablet Take 2 tablets (20 mEq total) by mouth 2 (two) times daily.   senna-docusate (SENOKOT-S) 8.6-50 MG tablet Take 2 tablets by mouth at bedtime. For AFTER surgery, do not take if having loose stools   traMADol (ULTRAM) 50 MG tablet Take 1 tablet (50 mg total) by mouth every 6 (six) hours as needed for severe pain. For AFTER surgery, do not take and drive   TRUEPLUS LANCETS 28G MISC Use as directted     Allergies:   Patient has no known allergies.   Social History  Tobacco Use   Smoking status: Never Smoker   Smokeless tobacco: Never Used  Substance Use Topics   Alcohol use: Not Currently   Drug use: Not Currently    Types: Marijuana    Comment: 07-28-2018  per pt last smoked 11/21/2018     Family Hx: The patient's family history includes Arthritis in an other family member; Cancer in her father and another family member; Diabetes in her maternal aunt, maternal uncle, mother, sister, and another family member; Heart disease in her mother, sister, and another family member; Hyperlipidemia in an other family member; Hypertension in her mother and another family member. There is no history of Breast cancer.  ROS:   Please see the history of present illness.    Constitutional: Negative for chills, fever, night sweats, unintentional weight loss  Respiratory: Negative for cough, sputum, wheezing.   Cardiovascular: See HPI. Gastrointestinal: Negative for abdominal pain, melena, and hematochezia.  Genitourinary: Negative for dysuria and hematuria.  Musculoskeletal: Negative for falls and  myalgias.  Neurological: Negative for focal weakness, focal sensory changes and loss of consciousness.  Endo/Heme/Allergies: Does not bruise/bleed easily.  All other systems reviewed and are negative.   Prior CV studies:   The following studies were reviewed today: Echo, coronary CTA  Labs/Other Tests and Data Reviewed:    EKG:  An ECG dated 12/31/2017 was personally reviewed today and demonstrated:  sinus, inferior infarct, PRWP  Recent Labs: 11/24/2018: ALT 14 11/29/2018: BUN 15; Creatinine, Ser 0.90; Hemoglobin 12.9; Platelets 221; Sodium 136 12/04/2018: Potassium 3.5   Recent Lipid Panel Lab Results  Component Value Date/Time   CHOL 131 09/20/2018 11:13 AM   TRIG 123 09/20/2018 11:13 AM   HDL 38 (L) 09/20/2018 11:13 AM   CHOLHDL 3.4 09/20/2018 11:13 AM   CHOLHDL 5 07/15/2015 03:13 PM   LDLCALC 68 09/20/2018 11:13 AM   LDLDIRECT 93.0 07/15/2015 03:13 PM    Wt Readings from Last 3 Encounters:  02/15/19 267 lb (121.1 kg)  12/22/18 269 lb 9.6 oz (122.3 kg)  12/04/18 272 lb 3.2 oz (123.5 kg)     Objective:    Vital Signs:  BP (!) 148/107    Pulse 92    Ht 5' 4"  (1.626 m)    Wt 267 lb (121.1 kg)    BMI 45.83 kg/m    Vitals reviewed Speaking comfortably on the phone, normal breathing No acute distress Alert and oriented  ASSESSMENT & PLAN:    CAD: progression since prior cath, now with CTO of LAD at mid-LAD 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 -needs to get good control of risk factors: -Diabetes: A1c was 10.0, most recent 7.9. On trulicity, insulin -hypercholesterolemia: 09/20/18 show LDL 68 (goal <70), TG 123, HDL 38. Continue atorvastatin 40 mg daily for hyperlipidemia -hypertension: above goal of <130/80 today but has been at goal on home readings usually -continue amlodipine, imdur for BP and microvascular circulation -refilled nitroglycerin, given instructions on use -continue aspirin 81 mg daily -continue carevedilol 25 mg BID -discussed avoidance of  NSAIDs, danger of MI with CAD. -reviewed exercise and nutrition recommendations, weight loss goals  -reviewed red flag warning signs that need immediate medical attention  COVID-19 Education: The signs and symptoms of COVID-19 were discussed with the patient and how to seek care for testing (follow up with PCP or arrange E-visit).  The importance of social distancing was discussed today.  Time:   Today, I have spent 16 minutes with the patient with telehealth technology discussing  the above problems.    Patient Instructions  Medication Instructions:  Your Physician recommend you continue on your current medication as directed.    If you need a refill on your cardiac medications before your next appointment, please call your pharmacy.   Lab work: None  Testing/Procedures: None  Follow-Up: At [facility], you and your health needs are our priority.  As part of our continuing mission to provide you with exceptional heart care, we have created designated Provider Care Teams.  These Care Teams include your primary Cardiologist (physician) and Advanced Practice Providers (APPs -  Physician Assistants and Nurse Practitioners) who all work together to provide you with the care you need, when you need it. You will need a follow up appointment in 6 months.  Please call our office 2 months in advance to schedule this appointment.  You may see Buford Dresser, MD or one of the following Advanced Practice Providers on your designated Care Team:   Kerin Ransom, PA-C Roby Lofts, PA-C  [place], [place]       Follow Up: 6 mos or sooner PRN  Signed, Buford Dresser, MD  02/15/2019  [place]

## 2019-02-16 ENCOUNTER — Encounter: Payer: Self-pay | Admitting: Cardiology

## 2019-02-19 MED FILL — ISOSORBIDE MN ER 60 MG TAB: 60 | 30 days supply | Qty: 30 | Fill #4

## 2019-02-19 MED FILL — ATORVASTATIN CALCIUM 40 MG: 40 | 30 days supply | Qty: 30 | Fill #1

## 2019-02-23 ENCOUNTER — Telehealth: Payer: Self-pay

## 2019-02-23 NOTE — Telephone Encounter (Signed)
Initiated a PA for trulicity with [employer]. Had to be sent for review reference # [id number]

## 2019-03-02 ENCOUNTER — Inpatient Hospital Stay: Payer: Medicaid Other | Admitting: Gynecologic Oncology

## 2019-03-05 NOTE — Progress Notes (Deleted)
Name: Kimberly Frazier  MRN/ DOB: [id number], [birth year]   Age/ Sex: 58 y.o., female    PCP: Ladell Pier, MD   Reason for Endocrinology Evaluation: Type 2 Diabetes Mellitus     Date of Initial Endocrinology Visit: 10/06/2018     Frazier IDENTIFIER: Kimberly Frazier is a 58 y.o. 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 Kimberly Frazier presented for initial endocrinology clinic visit on 10/06/2018 for consultative assistance with her diabetes management.    HPI: Kimberly Frazier was    Diagnosed with T2DM in > 10 yrs ago Prior Medications tried/Intolerance: Metformin - GI intolerance.  Hypoglycemia episodes : no             Hemoglobin A1c has ranged from 7.9% in 2014, peaking at 13.2% in 2016.    Adrenal Adenoma History : This was initially discovered on abdominal MRI in 12/2017 there is a 1.7 cm left adrenal adenoma and again on a staging CT scan in 07/2018, it was measured at 2.0 cm .   Pheochromocytoma work up negative 09/2018 24-hr urine cortisol 30.5 mcg in 09/2018 Aldo: renin ratio 76.5 by 11/2018  SUBJECTIVE:   During Kimberly last visit (12/04/2018): We continued  trulicity, lantus and humalog  Today (12/04/2018): Kimberly Frazier is here for a follow up on diabetes management and adrenal adenoma . She checks her blood sugars 1-2 times daily, preprandial . Kimberly Frazier has not had hypoglycemic episodes since Kimberly last clinic visit. Kimberly pt did not bring her meter today. Otherwise, Kimberly Frazier has not required any recent emergency interventions for hypoglycemia but recently was hospitalized for hysterectomy.    HOME DIABETES REGIMEN: Lantus 54 units QHS Humalog 24 units TIDQAC  Trulicity 1.5 weekly (Saturday)   Statin: yes ACE-I/ARB: No    METER DOWNLOAD SUMMARY:did not bring  Yesterday 127 mg/dL     DIABETIC COMPLICATIONS: Microvascular complications:   Neuropathy  Denies: Variable GFR, retinopathy   Last eye exam: Completed  2018  Macrovascular complications:   CAD (awaiting CABG per pt)  Denies: PVD, CVA   PAST HISTORY: Past Medical History:  Past Medical History:  Diagnosis Date   Adrenal adenoma, left    Arthritis    Cataract    Bilateral   CKD (chronic kidney disease), stage II    Coronary artery disease 07-28-2018 followed [facility]  currently due to no insurance   per cardiac cath 02-06-2015 (positive mild lateral ishcemia on stress test)--- dLAD 80%,  mLAD 40%,  mPDA 80% (small vessel),  ostial D1 70%,  CFx with lumial irregarlities-- medical management   Depression    Diabetic neuropathy ([place])    History of Bell's palsy 07/2011   per pt residual facial pain on left side occasionally   History of cancer of vagina 1999   per pt completed radiation and chemo   History of sepsis 12/30/2017   positive blood culter fro E.coli   Hyperlipidemia    Hypertension    Hypokalemia    Insulin dependent type 2 diabetes mellitus, uncontrolled ([place])    followed by pcp---  A1c was 11.7 on 06-15-2018 in epic   Myocardial infarction ([place])    Nocturia    Peripheral neuropathy    PMB (postmenopausal bleeding)    Wears dentures    upper   Wears glasses    Past Surgical History:  Past Surgical History:  Procedure Laterality Date   BREAST BIOPSY  2012  benign   CARDIAC CATHETERIZATION N/A 02/06/2015   Procedure: Left Heart Cath and Coronary Angiography;  Surgeon: Larey Dresser, MD;  Location: [facility];  Service: Cardiovascular;  Laterality: N/A;   HYSTEROSCOPY W/D&C N/A 08/01/2018   Procedure: DILATATION AND CURETTAGE /HYSTEROSCOPY;  Surgeon: Mora Bellman, MD;  Location: [facility];  Service: Gynecology;  Laterality: N/A;   ROBOTIC ASSISTED TOTAL HYSTERECTOMY WITH BILATERAL SALPINGO OOPHERECTOMY N/A 11/28/2018   Procedure: XI ROBOTIC ASSISTED TOTAL HYSTERECTOMY WITH BILATERAL SALPINGO OOPHORECTOMY;  Surgeon: Everitt Amber, MD;   Location: [facility];  Service: Gynecology;  Laterality: N/A;   SENTINEL NODE BIOPSY N/A 11/28/2018   Procedure: SENTINEL NODE BIOPSY;  Surgeon: Everitt Amber, MD;  Location: [facility];  Service: Gynecology;  Laterality: N/A;   UMBILICAL HERNIA REPAIR  child      Social History:  reports that she has never smoked. She has never used smokeless tobacco. She reports previous alcohol use. She reports previous drug use. Drug: Marijuana. Family History:  Family History  Problem Relation Age of Onset   Heart disease Mother    Hypertension Mother    Diabetes Mother    Cancer Father        hodgkins lymphoma   Heart disease Sister        heart attack   Diabetes Sister    Cancer Other        parent   Diabetes Other        parent   Heart disease Other        parent   Hyperlipidemia Other        parent   Hypertension Other        parent   Arthritis Other        parent   Diabetes Maternal Aunt    Diabetes Maternal Uncle    Breast cancer Neg Hx      HOME MEDICATIONS: Allergies as of 03/06/2019   No Known Allergies     Medication List       Accurate as of March 05, 2019  3:57 PM. If you have any questions, ask your nurse or doctor.        acetaminophen 500 MG tablet Commonly known as: TYLENOL Take 1,000 mg by mouth every 6 (six) hours as needed for moderate pain or headache.   amLODipine 5 MG tablet Commonly known as: NORVASC Take 1 tablet (5 mg total) by mouth daily.   aspirin EC 81 MG tablet Take 1 tablet (81 mg total) daily by mouth.   atorvastatin 40 MG tablet Commonly known as: LIPITOR TAKE 1 TABLET DAILY BY MOUTH.   BD Pen Needle Nano U/F 32G X 4 MM Misc Generic drug: Insulin Pen Needle USE TWICE DAILY AS DIRECTED   Insulin Pen Needle 32G X 4 MM Misc Commonly known as: Engineer, maintenance Pen Needles Use as directed with insulin   carvedilol 25 MG tablet Commonly known as: COREG Take 1 tablet (25 mg total) by mouth 2 (two) times daily. Needs to make  appointment.   gabapentin 300 MG capsule Commonly known as: NEURONTIN Take 1 capsule (300 mg total) by mouth at bedtime.   glucose blood test strip Commonly known as: True Metrix Blood Glucose Test Use as instructed   Insulin Glargine 100 UNIT/ML Solostar Pen Commonly known as: Lantus SoloStar Inject 55 Units into Kimberly skin daily. What changed:   how much to take  when to take this   insulin lispro 100 UNIT/ML injection Commonly known  as: HumaLOG Inject 0.24 mLs (24 Units total) into Kimberly skin 3 (three) times daily with meals. INJECT 28 UNITS INTO Kimberly SKIN THREE TIMES DAILY BEFORE MEALS. What changed: additional instructions   isosorbide mononitrate 60 MG 24 hr tablet Commonly known as: IMDUR Take 1 tablet (60 mg total) by mouth daily.   nitroGLYCERIN 0.4 MG SL tablet Commonly known as: NITROSTAT Place 1 tab under tongue for chest pain.  May repeat after 5 minutes x 2.  DO NOT TAKE MORE THAN 3 TABS DURING AN EPISODE OF CHEST PAIN What changed:   how much to take  how to take this  when to take this  reasons to take this  additional instructions   potassium chloride 10 MEQ tablet Commonly known as: KLOR-CON Take 2 tablets (20 mEq total) by mouth 2 (two) times daily.   senna-docusate 8.6-50 MG tablet Commonly known as: Senokot-S Take 2 tablets by mouth at bedtime. For AFTER surgery, do not take if having loose stools   traMADol 50 MG tablet Commonly known as: ULTRAM Take 1 tablet (50 mg total) by mouth every 6 (six) hours as needed for severe pain. For AFTER surgery, do not take and drive   True Metrix Meter w/Device Kit Use as directed   TRUEplus Lancets 28G Misc Use as directted   Trulicity 1.5 FO/2.7XA Sopn Generic drug: Dulaglutide Inject 1.5 mg into Kimberly skin once a week.        ALLERGIES: No Known Allergies   REVIEW OF SYSTEMS: A comprehensive ROS was conducted with Kimberly Frazier and is negative except as per HPI and below:  Review of Systems    Constitutional: Negative for fever and malaise/fatigue.  HENT: Negative for congestion and sore throat.   Eyes: Positive for blurred vision. Negative for pain.  Respiratory: Negative for cough and shortness of breath.   Cardiovascular: Negative for chest pain and palpitations.  Gastrointestinal: Negative for diarrhea and nausea.  Genitourinary: Positive for frequency.  Skin: Negative.   Neurological: Positive for tingling. Negative for tremors.  Endo/Heme/Allergies: Positive for polydipsia.  Psychiatric/Behavioral: Positive for depression. Kimberly Frazier is not nervous/anxious.       OBJECTIVE:   VITAL SIGNS: There were no vitals taken for this visit.   PHYSICAL EXAM:  General: Pt appears well and is in NAD  Lungs: Clear with good BS bilat with no rales, rhonchi, or wheezes  Heart: RRR with normal S1 and S2 and no gallops; no murmurs; no rub  Abdomen: Normoactive bowel sounds, soft, nontender, without masses or organomegaly palpable  Extremities:  Lower extremities - No pretibial edema. No lesions.  Skin: Normal texture and temperature to palpation. No rash noted.  Neuro: MS is good with appropriate affect, pt is alert and Ox3    DM foot exam: deferred    DATA REVIEWED:  Lab Results  Component Value Date   HGBA1C 7.9 (H) 11/24/2018   HGBA1C 10.0 (H) 08/11/2018   HGBA1C 11.7 (A) 06/15/2018   Lab Results  Component Value Date   MICROALBUR 1.5 07/15/2015   LDLCALC 68 09/20/2018   CREATININE 0.90 11/29/2018   Lab Results  Component Value Date   MICRALBCREAT 1.4 07/15/2015    Lab Results  Component Value Date   CHOL 131 09/20/2018   HDL 38 (L) 09/20/2018   LDLCALC 68 09/20/2018   LDLDIRECT 93.0 07/15/2015   TRIG 123 09/20/2018   CHOLHDL 3.4 09/20/2018        ASSESSMENT / PLAN / RECOMMENDATIONS:   1) Type  2 Diabetes Mellitus, Poorly Controlled, With Neuropathic and Macrovascular complications - Most recent A1c of 7.9 %. Goal A1c < 7.0%.      Plan: GENERAL:  - Praised Kimberly pt on great glycemic control with an A1c trending from 10.0% to 7.9% , we did discuss Kimberly goal of  7.0% - She was encouraged to continue with lifestyle changes.  - We discussed Kimberly importance of having glucose data available to make proper management decisions.  - No changes will be made today   MEDICATIONS:  Trulicity 1.5 mg weekly (Saturday )  Lantus to 54 units daily   Humalog to 24 units TIDQAC  EDUCATION / INSTRUCTIONS:  BG monitoring instructions: Frazier is instructed to check her blood sugars 4 times a day, before meals and bedtime.  Call [facility] if: BG persistently < 70 or > 300.  I reviewed Kimberly Rule of 15 for Kimberly treatment of hypoglycemia in detail with Kimberly Frazier. Literature supplied.    2) Left Adrenal Adenoma :  - 85 % of adrenal incidentaloma's are benign. Despite having an MRI in 2019 with a left adrenal adenoma size of 1.7 cm and a CT scan in 2020 at 2.0 cm , I am not sure I could say for sure that there's an increase in size, given 2 different imaging modalities  - My recommendations is to repeat CT with adrenal protocol in 03/2019 - Hormonal check up was negative for pheo and cushing. Have not been able to check Aldo due to hypokalemia.  - Repeat potassium today is normal, will proceed with aldo:renin ratio    F/u in 3 months     Signed electronically by: Mack Guise, MD  [facility]  [facility] [address] [place], [place] [address] Phone: [phone] FAX: [phone]   CC: Ladell Pier, MD [place] [address] Phone: [phone]  Fax: [phone]    Return to Endocrinology clinic as below: Future Appointments  Date Time Provider [place]  03/06/2019 10:10 AM Chiron Campione, Melanie Crazier, MD [facility] None  03/16/2019  2:30 PM Everitt Amber, MD [facility] None

## 2019-03-06 ENCOUNTER — Ambulatory Visit: Payer: Self-pay | Admitting: Internal Medicine

## 2019-03-16 ENCOUNTER — Inpatient Hospital Stay: Payer: Medicaid Other | Attending: Gynecologic Oncology | Admitting: Gynecologic Oncology

## 2019-03-16 ENCOUNTER — Encounter: Payer: Self-pay | Admitting: Gynecologic Oncology

## 2019-03-16 ENCOUNTER — Other Ambulatory Visit: Payer: Self-pay

## 2019-03-16 VITALS — BP 146/79 | HR 91 | Temp 98.5°F | Resp 18 | Ht 64.0 in | Wt 266.0 lb

## 2019-03-16 DIAGNOSIS — E1122 Type 2 diabetes mellitus with diabetic chronic kidney disease: Secondary | ICD-10-CM | POA: Insufficient documentation

## 2019-03-16 DIAGNOSIS — I2510 Atherosclerotic heart disease of native coronary artery without angina pectoris: Secondary | ICD-10-CM | POA: Insufficient documentation

## 2019-03-16 DIAGNOSIS — Z90722 Acquired absence of ovaries, bilateral: Secondary | ICD-10-CM | POA: Insufficient documentation

## 2019-03-16 DIAGNOSIS — I252 Old myocardial infarction: Secondary | ICD-10-CM | POA: Insufficient documentation

## 2019-03-16 DIAGNOSIS — Z7982 Long term (current) use of aspirin: Secondary | ICD-10-CM | POA: Insufficient documentation

## 2019-03-16 DIAGNOSIS — E1142 Type 2 diabetes mellitus with diabetic polyneuropathy: Secondary | ICD-10-CM | POA: Diagnosis not present

## 2019-03-16 DIAGNOSIS — I129 Hypertensive chronic kidney disease with stage 1 through stage 4 chronic kidney disease, or unspecified chronic kidney disease: Secondary | ICD-10-CM | POA: Diagnosis not present

## 2019-03-16 DIAGNOSIS — M1990 Unspecified osteoarthritis, unspecified site: Secondary | ICD-10-CM | POA: Insufficient documentation

## 2019-03-16 DIAGNOSIS — Z6841 Body Mass Index (BMI) 40.0 and over, adult: Secondary | ICD-10-CM | POA: Insufficient documentation

## 2019-03-16 DIAGNOSIS — Z90710 Acquired absence of both cervix and uterus: Secondary | ICD-10-CM | POA: Diagnosis not present

## 2019-03-16 DIAGNOSIS — E1165 Type 2 diabetes mellitus with hyperglycemia: Secondary | ICD-10-CM | POA: Diagnosis not present

## 2019-03-16 DIAGNOSIS — E7800 Pure hypercholesterolemia, unspecified: Secondary | ICD-10-CM | POA: Insufficient documentation

## 2019-03-16 DIAGNOSIS — B373 Candidiasis of vulva and vagina: Secondary | ICD-10-CM | POA: Diagnosis not present

## 2019-03-16 DIAGNOSIS — E785 Hyperlipidemia, unspecified: Secondary | ICD-10-CM | POA: Diagnosis not present

## 2019-03-16 DIAGNOSIS — B3731 Acute candidiasis of vulva and vagina: Secondary | ICD-10-CM

## 2019-03-16 DIAGNOSIS — Z79899 Other long term (current) drug therapy: Secondary | ICD-10-CM | POA: Diagnosis not present

## 2019-03-16 DIAGNOSIS — C541 Malignant neoplasm of endometrium: Secondary | ICD-10-CM | POA: Insufficient documentation

## 2019-03-16 DIAGNOSIS — E1136 Type 2 diabetes mellitus with diabetic cataract: Secondary | ICD-10-CM | POA: Insufficient documentation

## 2019-03-16 MED ORDER — NYSTATIN 100000 UNIT/GM EX POWD: Freq: Two times a day (BID) | CUTANEOUS | 0 refills | Status: DC

## 2019-03-16 MED FILL — NYSTATIN 100000 UNIT/GM POW: 100000 | 7 days supply | Qty: 15 | Fill #0

## 2019-03-16 NOTE — Patient Instructions (Signed)
Please notify Dr Denman George at phone number [phone] if you notice vaginal bleeding, new pelvic or abdominal pains, bloating, feeling full easy, or a change in bladder or bowel function.   Please contact Dr Serita Grit office (at [phone]) in November, 2020 to request an appointment with her for January, 2021.  Try nystatin in the groin creases (a powder) twice a day for itch.   It is safe to resume colonoscopy screening. Let Dr Serita Grit office know if you need help scheduling this with the GI doctor who performed your last colonoscopy (or a new provider if you would prefer).

## 2019-03-16 NOTE — Progress Notes (Signed)
Follow-up Note: Gyn-Onc  Consult was initially requested by Dr. Elly Modena for the evaluation of Kimberly Frazier 58 y.o. female  CC:  Chief Complaint  Patient presents with  . Endometrial cancer [facility])    Assessment/Plan:  [name]  is a 58 y.o.  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 High/intermediate risk factors for recurrence.  Adjuvant radiation was not possible due to prior radiation and adjuvant chemotherapy was declined due to medical comorbidities.   Urinary incontinence - recommend continued follow-up with Dr Matilde Sprang.   Vulvovaginal candidiasis - nystatin powder  I will see her back for follow-up in 3 months.   HPI: [name] is a 58 year old P2 who is seen in consultation at the request of Dr Elly Modena for grade 3 endometrial cancer.  The patient reports a history of light vaginal spotting for approximately 1 year.  Since January 2020 she developed heavy vaginal bleeding and this prompted her to see Dr. Elly Modena who performed a transvaginal ultrasound on June 20, 2018.  This revealed a uterus measuring 7.6 x 3.1 x 4.9 cm, there was a hypoechoic masslike area in the cervix measuring 1.6 x 1.4 x 1.9 cm.  The endometrium was thickened at 14 mm.  It contained an endometrial mass measuring 2.6 cm in greatest dimension.  She was taken to the operating room on August 01, 2018 with Dr. Elly Modena.  She performed the hysteroscopy D&C.  Intraoperative findings were significant for an 8-week size uterus, diffuse proliferative endometrium, but no mention of an endometrial mass.  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  Of note the patient has a history of a Pap smear in January 2020  that was normal with no high risk HPV detected.  The patient's medical history is complex.  She has a remote history of a gynecologic malignancy in 1999.  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  She describes treatment with primary brachii therapy (she describes being in bed in the hospital for 5 days with radiation internally).  She is unsure if she received chemotherapy.  She did not receive surgery for this.  She received follow-up for approximately a year after treatment and then discontinued.  Her medical history is also significant for coronary artery disease.  The patient had what she describes as a light myocardial infarction in 2016.  This was managed with a coronary angiography, however no stent was placed.  She did not receive ongoing cardiology evaluation as the patient reports that she did not have health insurance.  She was initially treated with medical therapy.  Her only medical therapy at the time of this diagnosis with aspirin 81 mg daily.  Patient has a history of type 2 diabetes mellitus for which she takes insulin.  She reports poor blood glucose control.  She reports not particularly being compliant with her diet.  She has sequelae of diabetic neuropathy in hands and feet and chronic kidney disease.  She has hypertension and hypercholesterolemia.  The patient is morbidly obese with a BMI of 48 kg/m.  Her prior abdominal surgery includes 1 prior cesarean section and, as a child, a midline laparotomy for a hernia repair.  Her family history is significant for father who had a diagnosis of  chins lymphoma, and a brother had a history of colon cancer.  Her CA 125 was elevated to 12% in March, 2020.  She was seen by cardiology who felt that she was moderate/high risk for perioperative cardiac events but that she was maximally optimized for a hysterectomy and did not advocate for CABG beforehand.  CT abd/pelvis on 08/17/18 showed no apparent extrauterine disease.  Small ground glass opacities were seen in the lower lungs -recommended 6 month follow-up.  She has been optimizing her blood glucose control.   The patient underwent robotic assisted total hysterectomy, BSO, SLN biopsy on 11/28/18. Final pathology revealed a high grade serous carcinoma of the endometrium with inner half (5 of 49m) invasion and no LVSI or cervical involvment. SLN's were negative as were the adnexa. She was assigned a stage IA grade 3 serous carcinoma. Her 2 equivocal, and MSI stable, MMR normal.   Due to the high grade nature of her pathology she met criteria for adjuvant therapy with either chemotherapy, vaginal brachytherapy or both. However, she is not a candidate for additional radiation due to her prior radiation for cervical cancer, and has medical comorbidities which would make delivery of chemotherapy challenging.   Postoperatively she did well. Though she developed stress urinary incontinence that was significant.   Interval Hx:  She was offered adjuvant chemotherapy but declined due to medical comorbidities and anticipated morbidity.  She was referred to Dr MMatilde Sprangfor urinary incontinence.   Vulvar itch.  Current Meds:  Outpatient Encounter Medications as of 03/16/2019  Medication Sig  . acetaminophen (TYLENOL) 500 MG tablet Take 1,000 mg by mouth every 6 (six) hours as needed for moderate pain or headache.  .Marland KitchenamLODipine (NORVASC) 5 MG tablet Take 1 tablet (5 mg total) by mouth daily.  .Marland Kitchenaspirin EC 81 MG tablet Take 1 tablet (81 mg total) daily by mouth.  .Marland Kitchenatorvastatin (LIPITOR) 40 MG tablet TAKE 1 TABLET DAILY BY MOUTH.  . BD PEN NEEDLE NANO U/F 32G X 4 MM MISC USE TWICE DAILY AS DIRECTED  . Blood Glucose Monitoring Suppl (TRUE METRIX METER) w/Device KIT Use as directed  . carvedilol (COREG) 25 MG tablet Take 1 tablet (25 mg total) by mouth 2 (two) times daily. Needs to make appointment.  . Dulaglutide (TRULICITY) 1.5 MZD/6.3OVSOPN Inject 1.5 mg into the  skin once a week.  . gabapentin (NEURONTIN) 300 MG capsule Take 1 capsule (300 mg total) by mouth at bedtime.  .Marland Kitchenglucose blood (TRUE METRIX BLOOD GLUCOSE TEST) test strip Use as instructed  . Insulin Glargine (LANTUS SOLOSTAR) 100 UNIT/ML Solostar Pen Inject 55 Units into the skin daily. (Patient taking differently: Inject 54 Units into the skin at bedtime. )  . insulin lispro (HUMALOG) 100 UNIT/ML injection Inject 0.24 mLs (24 Units total) into the skin 3 (three) times daily with meals. INJECT 28 UNITS INTO THE SKIN THREE TIMES DAILY BEFORE MEALS. (Patient taking differently: Inject 24 Units into the skin 3 (three) times daily with meals. )  . Insulin Pen Needle (ULTICARE MICRO PEN NEEDLES) 32G X 4 MM MISC Use as directed with insulin  . nitroGLYCERIN (NITROSTAT) 0.4 MG SL tablet Place 1 tab under tongue for chest pain.  May repeat after 5 minutes x 2.  DO NOT TAKE MORE THAN 3 TABS DURING AN EPISODE OF CHEST PAIN (Patient taking differently: Place 0.4 mg under the tongue every 5 (five) minutes as needed for chest pain. DO NOT TAKE MORE THAN 3 TABS DURING AN EPISODE OF  CHEST PAIN)  . potassium chloride (K-DUR) 10 MEQ tablet Take 2 tablets (20 mEq total) by mouth 2 (two) times daily.  . TRUEPLUS LANCETS 28G MISC Use as directted  . isosorbide mononitrate (IMDUR) 60 MG 24 hr tablet Take 1 tablet (60 mg total) by mouth daily.  Marland Kitchen nystatin (MYCOSTATIN/NYSTOP) powder Apply topically 2 (two) times daily.  . [DISCONTINUED] senna-docusate (SENOKOT-S) 8.6-50 MG tablet Take 2 tablets by mouth at bedtime. For AFTER surgery, do not take if having loose stools (Patient not taking: Reported on 03/16/2019)  . [DISCONTINUED] traMADol (ULTRAM) 50 MG tablet Take 1 tablet (50 mg total) by mouth every 6 (six) hours as needed for severe pain. For AFTER surgery, do not take and drive (Patient not taking: Reported on 03/16/2019)   No facility-administered encounter medications on file as of 03/16/2019.     Allergy: No  Known Allergies  Social Hx:   Social History   Socioeconomic History  . Marital status: Single    Spouse name: Not on file  . Number of children: 2  . Years of education: 34  . Highest education level: Not on file  Occupational History  . Occupation: retired  Scientific laboratory technician  . Financial resource strain: Not on file  . Food insecurity    Worry: Often true    Inability: Sometimes true  . Transportation needs    Medical: No    Non-medical: No  Tobacco Use  . Smoking status: Never Smoker  . Smokeless tobacco: Never Used  Substance and Sexual Activity  . Alcohol use: Not Currently  . Drug use: Not Currently    Types: Marijuana    Comment: 07-28-2018  per pt last smoked 11/21/2018  . Sexual activity: Yes  Lifestyle  . Physical activity    Days per week: 0 days    Minutes per session: 0 min  . Stress: Only a little  Relationships  . Social connections    Talks on phone: More than three times a week    Gets together: More than three times a week    Attends religious service: Never    Active member of club or organization: No    Attends meetings of clubs or organizations: Never    Relationship status: Living with partner  . Intimate partner violence    Fear of current or ex partner: No    Emotionally abused: No    Physically abused: No    Forced sexual activity: No  Other Topics Concern  . Not on file  Social History Narrative   Regular exercise-no   No caffeine use    Past Surgical Hx:  Past Surgical History:  Procedure Laterality Date  . BREAST BIOPSY  2012   benign  . CARDIAC CATHETERIZATION N/A 02/06/2015   Procedure: Left Heart Cath and Coronary Angiography;  Surgeon: Larey Dresser, MD;  Location: [facility];  Service: Cardiovascular;  Laterality: N/A;  . CATARACT EXTRACTION, BILATERAL    . HYSTEROSCOPY W/D&C N/A 08/01/2018   Procedure: DILATATION AND CURETTAGE /HYSTEROSCOPY;  Surgeon: Mora Bellman, MD;  Location: [place];  Service:  Gynecology;  Laterality: N/A;  . ROBOTIC ASSISTED TOTAL HYSTERECTOMY WITH BILATERAL SALPINGO OOPHERECTOMY N/A 11/28/2018   Procedure: XI ROBOTIC ASSISTED TOTAL HYSTERECTOMY WITH BILATERAL SALPINGO OOPHORECTOMY;  Surgeon: Everitt Amber, MD;  Location: [facility];  Service: Gynecology;  Laterality: N/A;  . SENTINEL NODE BIOPSY N/A 11/28/2018   Procedure: SENTINEL NODE BIOPSY;  Surgeon: Everitt Amber, MD;  Location: [facility];  Service: Gynecology;  Laterality: N/A;  . UMBILICAL HERNIA REPAIR  child    Past Medical Hx:  Past Medical History:  Diagnosis Date  . Adrenal adenoma, left   . Arthritis   . Cataract    Bilateral  . CKD (chronic kidney disease), stage II   . Coronary artery disease 07-28-2018 [facility]  currently due to no insurance   per cardiac cath 02-06-2015 (positive mild lateral ishcemia on stress test)--- dLAD 80%,  mLAD 40%,  mPDA 80% (small vessel),  ostial D1 70%,  CFx with lumial irregarlities-- medical management  . Depression   . Diabetic neuropathy ([place])   . History of Bell's palsy 07/2011   per pt residual facial pain on left side occasionally  . History of cancer of vagina 1999   per pt completed radiation and chemo  . History of sepsis 12/30/2017   positive blood culter fro E.coli  . Hyperlipidemia   . Hypertension   . Hypokalemia   . Insulin dependent type 2 diabetes mellitus, uncontrolled ([place])    followed by pcp---  A1c was 11.7 on 06-15-2018 in epic  . Myocardial infarction ([place])   . Nocturia   . Peripheral neuropathy   . PMB (postmenopausal bleeding)   . Wears dentures    upper  . Wears glasses     Past Gynecological History:  See HPI No LMP recorded. Patient is postmenopausal.  Family Hx:  Family History  Problem Relation Age of Onset  . Heart disease Mother   . Hypertension Mother   . Diabetes Mother   . Cancer Father        hodgkins lymphoma  . Heart disease Sister        heart attack  . Diabetes Sister   . Cancer  Other        parent  . Diabetes Other        parent  . Heart disease Other        parent  . Hyperlipidemia Other        parent  . Hypertension Other        parent  . Arthritis Other        parent  . Diabetes Maternal Aunt   . Diabetes Maternal Uncle   . Breast cancer Neg Hx     Review of Systems:  Constitutional  Feels well,    ENT Normal appearing ears and nares bilaterally Skin/Breast  No rash, sores, jaundice, itching, dryness Cardiovascular  No chest pain, shortness of breath, or edema  Pulmonary  No cough or wheeze.  Gastro Intestinal  No nausea, vomitting, or diarrhoea. No bright red blood per rectum, no abdominal pain, change in bowel movement, or constipation.  Genito Urinary  No frequency, urgency, dysuria, + incontinence Musculo Skeletal  No myalgia, arthralgia, joint swelling or pain  Neurologic  No weakness, numbness, change in gait,  Psychology  No depression, anxiety, insomnia.   Vitals:  Blood pressure (!) 146/79, pulse 91, temperature 98.5 F (36.9 C), temperature source Oral, resp. rate 18, height 5' 4"  (1.626 m), weight 266 lb (120.7 kg), SpO2 100 %.  Physical Exam: WD in NAD Neck  Supple NROM, without any enlargements.  Lymph Node Survey No cervical supraclavicular or inguinal adenopathy Cardiovascular  Pulse normal rate, regularity and rhythm. S1 and S2 normal.  Lungs  Clear to auscultation bilateraly, without wheezes/crackles/rhonchi. Good air movement.  Skin  No rash/lesions/breakdown  Psychiatry  Alert and oriented to person, place, and time  Abdomen  Normoactive bowel sounds, abdomen soft, non-tender and obese without evidence of hernia. Soft incisions Back No CVA tenderness Genito Urinary  Vulva/vagina: Normal external female genitalia.  No lesions. No discharge or bleeding.  Bladder/urethra:  No lesions or masses, + increased urethral mobility  Vagina: well healed vaginal cuff, no lesions or masses or blood  Cervix: Normal  appearing, no lesions. Rectal  deferred  Extremities   No bilateral cyanosis, clubbing or edema.  Thereasa Solo, MD  03/16/2019, 3:20 PM

## 2019-03-21 ENCOUNTER — Other Ambulatory Visit: Payer: Self-pay | Admitting: Internal Medicine

## 2019-03-21 DIAGNOSIS — I10 Essential (primary) hypertension: Secondary | ICD-10-CM

## 2019-03-21 DIAGNOSIS — I2510 Atherosclerotic heart disease of native coronary artery without angina pectoris: Secondary | ICD-10-CM

## 2019-03-21 MED FILL — ?POTASSIUM CL ER 10 MEQ TAB: 10 MEQ | 30 days supply | Qty: 60 | Fill #1

## 2019-03-21 MED FILL — CARVEDILOL 25 MG TABLET: 25 | 30 days supply | Qty: 60 | Fill #0

## 2019-03-23 ENCOUNTER — Other Ambulatory Visit: Payer: Self-pay | Admitting: Internal Medicine

## 2019-03-23 ENCOUNTER — Ambulatory Visit (INDEPENDENT_AMBULATORY_CARE_PROVIDER_SITE_OTHER): Payer: Medicaid Other | Admitting: Ophthalmology

## 2019-03-23 ENCOUNTER — Encounter (INDEPENDENT_AMBULATORY_CARE_PROVIDER_SITE_OTHER): Payer: Self-pay | Admitting: Ophthalmology

## 2019-03-23 ENCOUNTER — Other Ambulatory Visit: Payer: Self-pay

## 2019-03-23 DIAGNOSIS — H35353 Cystoid macular degeneration, bilateral: Secondary | ICD-10-CM | POA: Diagnosis not present

## 2019-03-23 DIAGNOSIS — Z961 Presence of intraocular lens: Secondary | ICD-10-CM

## 2019-03-23 DIAGNOSIS — H35033 Hypertensive retinopathy, bilateral: Secondary | ICD-10-CM

## 2019-03-23 DIAGNOSIS — E113313 Type 2 diabetes mellitus with moderate nonproliferative diabetic retinopathy with macular edema, bilateral: Secondary | ICD-10-CM

## 2019-03-23 DIAGNOSIS — I10 Essential (primary) hypertension: Secondary | ICD-10-CM | POA: Diagnosis not present

## 2019-03-23 DIAGNOSIS — H3581 Retinal edema: Secondary | ICD-10-CM | POA: Diagnosis not present

## 2019-03-23 MED ORDER — PROLENSA 0.07 % OP SOLN: 1.0000 [drp] | Freq: Four times a day (QID) | OPHTHALMIC | 0 refills | Status: DC

## 2019-03-23 MED ORDER — KETOROLAC TROMETHAMINE 0.5 % OP SOLN: 1.0000 [drp] | Freq: Four times a day (QID) | OPHTHALMIC | 2 refills | Status: DC

## 2019-03-23 MED ORDER — BEVACIZUMAB CHEMO INJECTION 1.25MG/0.05ML SYRINGE FOR KALEIDOSCOPE
1.2500 mg | INTRAVITREAL | Status: AC | PRN
  Administered 2019-03-23: 13:00:00 1.25 mg via INTRAVITREAL

## 2019-03-23 MED FILL — HumaLOG 100 UNIT/ML SOLN: 100 | 23 days supply | Qty: 20 | Fill #0

## 2019-03-23 MED FILL — PREDNISOLONE AC 1% EYE DROP: 1 | 25 days supply | Qty: 5 | Fill #1

## 2019-03-23 MED FILL — KETOROLAC 0.5% OPHTH SOLN: 0.5 | 25 days supply | Qty: 10 | Fill #0

## 2019-03-23 MED FILL — OFLOXACIN 0.3% EYE DROPS: 0.3 | 25 days supply | Qty: 5 | Fill #1

## 2019-03-23 NOTE — Progress Notes (Signed)
[facility] Note  03/23/2019     CHIEF COMPLAINT Patient presents for Retina Evaluation   HISTORY OF PRESENT ILLNESS: Kimberly Frazier is a 57 y.o. female who presents to the clinic today for:   HPI    Retina Evaluation    In both eyes.  This started weeks ago.  Duration of weeks.  Associated Symptoms Floaters and Flashes.  Context:  distance vision.  I, the attending physician,  performed the HPI with the patient and updated documentation appropriately.          Comments    CE/IOL OD: 03/07/2019 CE/IOL OS: 02/28/2019 (Surgeon: Kimberly Frazier)  BS yesterday: 134 A1c in June: 7.9  Patient states her vision is worse in her left eye and became worse after her cataract surgery.  Patient states her vision in her right eye is poor as well but not nearly as poor as the left eye.  Patient has had some occasional sharp, shooting pain OS and states her left eye feels swollen or "full".  Patient has noticed over the last few weeks, a greater frequency of floaters and flashes of light OU.       Last edited by Kimberly Caffey, MD on 03/23/2019  3:31 PM. (History)    pt was referred by Dr. Gershon Frazier for possible CME, she states her left eye was done on September 30 and the right eye was October 7th, but states at first she thought the sx helped her left eye, but once she had the right eye done, she could tell the sx had not helped her left eye, she states she sees occasional flashes and floaters, pt is still on drops for the right eye she states the cap colors are tan and white, pt endorses being diabetic and hypertensive, pt is on humalog, lantus and trulicity, she states she tries to keep her blood sugar under control, but it's a "battle", pt states prior to seeing Dr. Gershon Frazier she has never had a diabetic eye exam, pt states she retired and lost her health insurance so she had to wait until she got medicaid to receive regular medical care as well as an eye  exam  Referring physician: Rutherford Guys, MD [place],  [place] [address]  HISTORICAL INFORMATION:   Selected notes from the [facility] Referred by Dr. Rutherford Frazier for concern of CME  LEE:  Ocular Hx- PMH-   CURRENT MEDICATIONS: Current Outpatient Medications (Ophthalmic Drugs)  Medication Sig  . Bromfenac Sodium (PROLENSA) 0.07 % SOLN Place 1 drop into both eyes 4 (four) times daily.  Marland Kitchen ketorolac (ACULAR) 0.5 % ophthalmic solution Place 1 drop into both eyes 4 (four) times daily.   No current facility-administered medications for this visit.  (Ophthalmic Drugs)   Current Outpatient Medications (Other)  Medication Sig  . acetaminophen (TYLENOL) 500 MG tablet Take 1,000 mg by mouth every 6 (six) hours as needed for moderate pain or headache.  Marland Kitchen amLODipine (NORVASC) 5 MG tablet Take 1 tablet (5 mg total) by mouth daily.  Marland Kitchen aspirin EC 81 MG tablet Take 1 tablet (81 mg total) daily by mouth.  Marland Kitchen atorvastatin (LIPITOR) 40 MG tablet TAKE 1 TABLET DAILY BY MOUTH.  . BD PEN NEEDLE NANO U/F 32G X 4 MM MISC USE TWICE DAILY AS DIRECTED  . Blood Glucose Monitoring Suppl (TRUE METRIX METER) w/Device KIT Use as directed  . carvedilol (COREG) 25 MG tablet TAKE 1 TABLET (25 MG TOTAL) BY  MOUTH 2 (TWO) TIMES DAILY. NEEDS TO MAKE APPOINTMENT.  . Dulaglutide (TRULICITY) 1.5 QI/2.9NL SOPN Inject 1.5 mg into the skin once a week.  . gabapentin (NEURONTIN) 300 MG capsule Take 1 capsule (300 mg total) by mouth at bedtime.  Marland Kitchen glucose blood (TRUE METRIX BLOOD GLUCOSE TEST) test strip Use as instructed  . Insulin Glargine (LANTUS SOLOSTAR) 100 UNIT/ML Solostar Pen Inject 55 Units into the skin daily. (Patient taking differently: Inject 54 Units into the skin at bedtime. )  . insulin lispro (HUMALOG) 100 UNIT/ML injection Inject 0.24 mLs (24 Units total) into the skin 3 (three) times daily with meals. INJECT 28 UNITS INTO THE SKIN THREE TIMES DAILY BEFORE MEALS. (Patient taking  differently: Inject 24 Units into the skin 3 (three) times daily with meals. )  . Insulin Pen Needle (ULTICARE MICRO PEN NEEDLES) 32G X 4 MM MISC Use as directed with insulin  . isosorbide mononitrate (IMDUR) 60 MG 24 hr tablet Take 1 tablet (60 mg total) by mouth daily.  . nitroGLYCERIN (NITROSTAT) 0.4 MG SL tablet Place 1 tab under tongue for chest pain.  May repeat after 5 minutes x 2.  DO NOT TAKE MORE THAN 3 TABS DURING AN EPISODE OF CHEST PAIN (Patient taking differently: Place 0.4 mg under the tongue every 5 (five) minutes as needed for chest pain. DO NOT TAKE MORE THAN 3 TABS DURING AN EPISODE OF CHEST PAIN)  . nystatin (MYCOSTATIN/NYSTOP) powder Apply topically 2 (two) times daily.  . potassium chloride (K-DUR) 10 MEQ tablet Take 2 tablets (20 mEq total) by mouth 2 (two) times daily.  . TRUEPLUS LANCETS 28G MISC Use as directted   No current facility-administered medications for this visit.  (Other)      REVIEW OF SYSTEMS: ROS    Positive for: Endocrine, Eyes   Negative for: Constitutional, Gastrointestinal, Neurological, Skin, Genitourinary, Musculoskeletal, HENT, Cardiovascular, Respiratory, Psychiatric, Allergic/Imm, Heme/Lymph   Last edited by Kimberly Frazier on 03/23/2019  9:13 AM. (History)       ALLERGIES No Known Allergies  PAST MEDICAL HISTORY Past Medical History:  Diagnosis Date  . Adrenal adenoma, left   . Arthritis   . Cataract    Bilateral  . CKD (chronic kidney disease), stage II   . Coronary artery disease 07-28-2018 [facility]  currently due to no insurance   per cardiac cath 02-06-2015 (positive mild lateral ishcemia on stress test)--- dLAD 80%,  mLAD 40%,  mPDA 80% (small vessel),  ostial D1 70%,  CFx with lumial irregarlities-- medical management  . Depression   . Diabetic neuropathy ([place])   . History of Bell's palsy 07/2011   per pt residual facial pain on left side occasionally  . History of cancer of vagina 1999    per pt completed radiation and chemo  . History of sepsis 12/30/2017   positive blood culter fro E.coli  . Hyperlipidemia   . Hypertension   . Hypokalemia   . Insulin dependent type 2 diabetes mellitus, uncontrolled ([place])    followed by pcp---  A1c was 11.7 on 06-15-2018 in epic  . Myocardial infarction ([place])   . Nocturia   . Peripheral neuropathy   . PMB (postmenopausal bleeding)   . Wears dentures    upper  . Wears glasses    Past Surgical History:  Procedure Laterality Date  . BREAST BIOPSY  2012   benign  . CARDIAC CATHETERIZATION N/A 02/06/2015   Procedure: Left Heart Cath and Coronary Angiography;  Surgeon:  Larey Dresser, MD;  Location: [facility];  Service: Cardiovascular;  Laterality: N/A;  . CATARACT EXTRACTION, BILATERAL    . HYSTEROSCOPY W/D&C N/A 08/01/2018   Procedure: DILATATION AND CURETTAGE /HYSTEROSCOPY;  Surgeon: Mora Bellman, MD;  Location: [facility];  Service: Gynecology;  Laterality: N/A;  . ROBOTIC ASSISTED TOTAL HYSTERECTOMY WITH BILATERAL SALPINGO OOPHERECTOMY N/A 11/28/2018   Procedure: XI ROBOTIC ASSISTED TOTAL HYSTERECTOMY WITH BILATERAL SALPINGO OOPHORECTOMY;  Surgeon: Everitt Amber, MD;  Location: [facility];  Service: Gynecology;  Laterality: N/A;  . SENTINEL NODE BIOPSY N/A 11/28/2018   Procedure: SENTINEL NODE BIOPSY;  Surgeon: Everitt Amber, MD;  Location: [facility];  Service: Gynecology;  Laterality: N/A;  . UMBILICAL HERNIA REPAIR  child    FAMILY HISTORY Family History  Problem Relation Age of Onset  . Heart disease Mother   . Hypertension Mother   . Diabetes Mother   . Cancer Father        hodgkins lymphoma  . Heart disease Sister        heart attack  . Diabetes Sister   . Cancer Other        parent  . Diabetes Other        parent  . Heart disease Other        parent  . Hyperlipidemia Other        parent  . Hypertension Other        parent  . Arthritis Other        parent  . Diabetes Maternal Aunt   . Diabetes  Maternal Uncle   . Breast cancer Neg Hx     SOCIAL HISTORY Social History   Tobacco Use  . Smoking status: Never Smoker  . Smokeless tobacco: Never Used  Substance Use Topics  . Alcohol use: Not Currently  . Drug use: Not Currently    Types: Marijuana    Comment: 07-28-2018  per pt last smoked 11/21/2018         OPHTHALMIC EXAM:  Base Eye Exam    Visual Acuity (Snellen - Linear)      Right Left   Dist [place] 20/40 -3 CF @ 4'   Dist ph [place] NI NI       Tonometry (Tonopen, 9:15 AM)      Right Left   Pressure 22 19       Pupils      Dark Light Shape React APD   Right 4 3 Round Brisk 0   Left 4 3 Round Brisk 0       Visual Fields      Left Right    Full Full       Extraocular Movement      Right Left    Full Full       Neuro/Psych    Oriented x3: Yes   Mood/Affect: Normal       Dilation    Both eyes: 1.0% Mydriacyl, 2.5% Phenylephrine @ 9:15 AM        Slit Lamp and Fundus Exam    Slit Lamp Exam      Right Left   Lids/Lashes Dermatochalasis - upper lid Dermatochalasis - upper lid   Conjunctiva/Sclera Mild Melanosis Mild Melanosis   Cornea 1+ Punctate epithelial erosions 1+ Punctate epithelial erosions, well healed temporal cataract wounds   Anterior Chamber Deep, 0.5+cell/pigment Deep, 0.5+cell/pigment   Iris Round and dilated, No NVI Round and dilated, No NVI   Lens PC IOL in good position  with trace PCO PC IOL in good position with trace PCO   Vitreous Vitreous syneresis Vitreous syneresis       Fundus Exam      Right Left   Disc Pink and Sharp Mild hyperema and elevation   C/D Ratio 0.1 0.1   Macula Blunted foveal reflex, +central edema, +Microaneurysms Blunted foveal reflex, central edema, +edudate/MA/IRH   Vessels Vascular attenuation, Tortuous, AV crossing changes Vascular attenuation, Tortuous, +venous beading, AV crossing changes   Periphery Attached, scattered druse, scattered IRH   Attached, scattered DBH greatest posteriorly            Refraction    Wearing Rx      Sphere   Right OTC   Left OTC       Manifest Refraction      Sphere Cylinder Dist VA   Right +0.50 Sphere 20/40-3   Left NI +/-3.00            IMAGING AND PROCEDURES  Imaging and Procedures for @TODAY @  OCT, Retina - OU - Both Eyes       Right Eye Quality was good. Central Foveal Thickness: 475. Progression has no prior data. Findings include abnormal foveal contour, intraretinal fluid, no SRF, intraretinal hyper-reflective material, vitreomacular adhesion .   Left Eye Quality was good. Central Foveal Thickness: 592. Progression has no prior data. Findings include abnormal foveal contour, intraretinal fluid, subretinal fluid, intraretinal hyper-reflective material, vitreomacular adhesion .   Notes *Images captured and stored on drive  Diagnosis / Impression:  Macular edema OU - likely combination of CME + DME OU  Clinical management:  See below  Abbreviations: NFP - Normal foveal profile. CME - cystoid macular edema. PED - pigment epithelial detachment. IRF - intraretinal fluid. SRF - subretinal fluid. EZ - ellipsoid zone. ERM - epiretinal membrane. ORA - outer retinal atrophy. ORT - outer retinal tubulation. SRHM - subretinal hyper-reflective material        Fluorescein Angiography Optos (Transit OS)       Right Eye   Progression has no prior data. Early phase findings include microaneurysm, vascular perfusion defect (Enlarged FAZ). Mid/Late phase findings include microaneurysm, leakage.   Left Eye   Progression has no prior data. Early phase findings include delayed filling, microaneurysm. Mid/Late phase findings include leakage, microaneurysm.   Notes **Images stored on drive**  Impression: Leaking MA OU; no NV --> Moderate NPDR OU OD with paracentral petaloid hyperfluorecence --> CME/[place]        Intravitreal Injection, Pharmacologic Agent - OS - Left Eye       Time Out 03/23/2019. 10:55 AM. Confirmed  correct patient, procedure, site, and patient consented.   Anesthesia Topical anesthesia was used. Anesthetic medications included Lidocaine 2%, Proparacaine 0.5%.   Procedure Preparation included 5% betadine to ocular surface, eyelid speculum. A supplied needle was used.   Injection:  1.25 mg Bevacizumab (AVASTIN) SOLN   NDC: 46503-546-56, Lot: [id number] , Expiration date: 05/16/2019   Route: Intravitreal, Site: Left Eye, Waste: 0 mL  Post-op Post injection exam found visual acuity of at least counting fingers. The patient tolerated the procedure well. There were no complications. The patient received written and verbal post procedure care education.                 ASSESSMENT/PLAN:    ICD-10-CM   1. Moderate nonproliferative diabetic retinopathy of both eyes with macular edema associated with type 2 diabetes mellitus (HCC)  05/29/2019 Intravitreal Injection, Pharmacologic Agent - OS - Left Eye  Bevacizumab (AVASTIN) SOLN 1.25 mg  2. Retinal edema  H35.81 OCT, Retina - OU - Both Eyes  3. CME (cystoid macular edema), bilateral  H35.353   4. Essential hypertension  I10   5. Hypertensive retinopathy of both eyes  H35.033 Fluorescein Angiography Optos (Transit OS)  6. Pseudophakia of both eyes  Z96.1     1-3. Moderate Non-proliferative diabetic retinopathy, OU OD with combination DME/CME  - The incidence, risk factors for progression, natural history and treatment options for diabetic retinopathy  were discussed with patient.    - The need for close monitoring of blood glucose, blood pressure, and serum lipids, avoiding cigarette or any type of tobacco, and the need for long term follow up was also discussed with patient.  - exam shows scattered [place], edema and tortuous blood vessels OU  - FA (10.23.20) shows leaking MA OU and paracentral petaloid hyperfluorecence OD --> CME/[place]  - OCT shows diabetic macular edema, OU; OD with CME/[place] component contributing as  well  - The natural history, pathology, and characteristics of diabetic macular edema discussed with patient.  A generalized discussion of the major clinical trials concerning treatment of diabetic macular edema (ETDRS, DCT, SCORE, RISE / RIDE, and ongoing DRCR net studies) was completed.  This discussion included mention of the various approaches to treating diabetic macular edema (observation, laser photocoagulation, anti-VEGF injections with lucentis / Avastin / Eylea, steroid injections with Kenalog / Ozurdex, and intraocular surgery with vitrectomy).  The goal hemoglobin A1C of 6-7 was discussed, as well as importance of smoking cessation and hypertension control.  Need for ongoing treatment and monitoring were specifically discussed with reference to chronic nature of diabetic macular edema.  - recommend IVA #1 OS today, 10.23.20  - recommend starting Pred Forte and Ketorolac QID OU  - pt wishes to proceed  - RBA of procedure discussed, questions answered  - informed consent obtained and signed  - see procedure note  - f/u in 2 wks, possible IVA OD  4,5. Hypertensive retinopathy OU  - discussed importance of tight BP control  - monitor  6. Pseudophakia OU  - s/p CE/IOL OU Kimberly Frazier)   OD: 03/07/2019   OS: 02/28/2019  - IOLs in good position  - CME OD as above  - monitor   Ophthalmic Meds Ordered this visit:  Meds ordered this encounter  Medications  . Bromfenac Sodium (PROLENSA) 0.07 % SOLN    Sig: Place 1 drop into both eyes 4 (four) times daily.    Dispense:  6 mL    Refill:  0  . ketorolac (ACULAR) 0.5 % ophthalmic solution    Sig: Place 1 drop into both eyes 4 (four) times daily.    Dispense:  10 mL    Refill:  2  . Bevacizumab (AVASTIN) SOLN 1.25 mg       Return in about 2 weeks (around 04/06/2019) for f/u NPDR OU/CME OD, DFE, OCT.  There are no Patient Instructions on file for this visit.   Explained the diagnoses, plan, and follow up with the patient and they  expressed understanding.  Patient expressed understanding of the importance of proper follow up care.   This document serves as a record of services personally performed by Gardiner Sleeper, MD, PhD. It was created on their behalf by Ernest Mallick, OA, an ophthalmic assistant. The creation of this record is the provider's dictation and/or activities during the visit.    Electronically signed by: Ernest Mallick, OA 10.23.2020 3:57 PM  Gardiner Sleeper, M.D., Ph.D. Diseases & Surgery of the Retina and Vitreous [facility]  I have reviewed the above documentation for accuracy and completeness, and I agree with the above. Gardiner Sleeper, M.D., Ph.D. 03/23/19 3:57 PM    Abbreviations: M myopia (nearsighted); A astigmatism; H hyperopia (farsighted); P presbyopia; Mrx spectacle prescription;  CTL contact lenses; OD right eye; OS left eye; OU both eyes  XT exotropia; ET esotropia; PEK punctate epithelial keratitis; PEE punctate epithelial erosions; DES dry eye syndrome; MGD meibomian gland dysfunction; ATs artificial tears; PFAT's preservative free artificial tears; [place] nuclear sclerotic cataract; PSC posterior subcapsular cataract; ERM epi-retinal membrane; PVD posterior vitreous detachment; RD retinal detachment; DM diabetes mellitus; DR diabetic retinopathy; NPDR non-proliferative diabetic retinopathy; PDR proliferative diabetic retinopathy; CSME clinically significant macular edema; DME diabetic macular edema; dbh dot blot hemorrhages; CWS cotton wool spot; POAG primary open angle glaucoma; C/D cup-to-disc ratio; HVF humphrey visual field; GVF goldmann visual field; OCT optical coherence tomography; IOP intraocular pressure; BRVO Branch retinal vein occlusion; CRVO central retinal vein occlusion; CRAO central retinal artery occlusion; BRAO branch retinal artery occlusion; RT retinal tear; SB scleral buckle; PPV pars plana vitrectomy; VH Vitreous hemorrhage; PRP panretinal laser  photocoagulation; IVK intravitreal kenalog; VMT vitreomacular traction; MH Macular hole;  NVD neovascularization of the disc; NVE neovascularization elsewhere; AREDS age related eye disease study; ARMD age related macular degeneration; POAG primary open angle glaucoma; EBMD epithelial/anterior basement membrane dystrophy; ACIOL anterior chamber intraocular lens; IOL intraocular lens; PCIOL posterior chamber intraocular lens; Phaco/IOL phacoemulsification with intraocular lens placement; [place] photorefractive keratectomy; LASIK laser assisted in situ keratomileusis; HTN hypertension; DM diabetes mellitus; COPD chronic obstructive pulmonary disease

## 2019-03-30 NOTE — Progress Notes (Signed)
[facility] Note  04/06/2019     CHIEF COMPLAINT Patient presents for Retina Follow Up   HISTORY OF PRESENT ILLNESS: Kimberly Frazier is a 58 y.o. female who presents to the clinic today for:   HPI    Retina Follow Up    Patient presents with  Diabetic Retinopathy.  In both eyes.  This started 2 weeks ago.  Severity is moderate.  I, the attending physician,  performed the HPI with the patient and updated documentation appropriately.          Comments    Patient here for 2 weeks retina follow up for NPDR OU/ OD with DME/CME. Patient states vision has improved. Some days better than other days. OS has eye pain goes from sharp to discomfort.       Last edited by Bernarda Caffey, MD on 04/06/2019  4:29 PM. (History)    pt is here for 2 week f/u for NPDR OU, pt will receive an injection in the right eye today, pt states she had no problems with the injection in her left eye at last visit, she is still using PF / ketorolac qid OU   Referring physician: Rutherford Guys, MD [address],  [place] [address]  HISTORICAL INFORMATION:   Selected notes from the MEDICAL RECORD NUMBER Referred by Dr. Rutherford Guys for concern of CME  LEE:  Ocular Hx- PMH-   CURRENT MEDICATIONS: Current Outpatient Medications (Ophthalmic Drugs)  Medication Sig  . Bromfenac Sodium (PROLENSA) 0.07 % SOLN Place 1 drop into both eyes 4 (four) times daily.  Marland Kitchen ketorolac (ACULAR) 0.5 % ophthalmic solution Place 1 drop into both eyes 4 (four) times daily.   No current facility-administered medications for this visit.  (Ophthalmic Drugs)   Current Outpatient Medications (Other)  Medication Sig  . acetaminophen (TYLENOL) 500 MG tablet Take 1,000 mg by mouth every 6 (six) hours as needed for moderate pain or headache.  Marland Kitchen amLODipine (NORVASC) 5 MG tablet Take 1 tablet (5 mg total) by mouth daily.  Marland Kitchen aspirin EC 81 MG tablet Take 1 tablet (81 mg total) daily by mouth.  Marland Kitchen  atorvastatin (LIPITOR) 40 MG tablet TAKE 1 TABLET DAILY BY MOUTH.  . BD PEN NEEDLE NANO U/F 32G X 4 MM MISC USE TWICE DAILY AS DIRECTED  . Blood Glucose Monitoring Suppl (TRUE METRIX METER) w/Device KIT Use as directed  . carvedilol (COREG) 25 MG tablet TAKE 1 TABLET (25 MG TOTAL) BY MOUTH 2 (TWO) TIMES DAILY. NEEDS TO MAKE APPOINTMENT.  . Dulaglutide (TRULICITY) 1.5 NO/0.3BC SOPN Inject 1.5 mg into the skin once a week.  . gabapentin (NEURONTIN) 300 MG capsule Take 1 capsule (300 mg total) by mouth at bedtime.  Marland Kitchen glucose blood (TRUE METRIX BLOOD GLUCOSE TEST) test strip Use as instructed  . HUMALOG 100 UNIT/ML injection INJECT 28 UNITS INTO THE SKIN THREE TIMES DAILY BEFORE MEALS.  Marland Kitchen Insulin Glargine (LANTUS SOLOSTAR) 100 UNIT/ML Solostar Pen Inject 55 Units into the skin daily. (Patient taking differently: Inject 54 Units into the skin at bedtime. )  . Insulin Pen Needle (ULTICARE MICRO PEN NEEDLES) 32G X 4 MM MISC Use as directed with insulin  . isosorbide mononitrate (IMDUR) 60 MG 24 hr tablet Take 1 tablet (60 mg total) by mouth daily.  . nitroGLYCERIN (NITROSTAT) 0.4 MG SL tablet Place 1 tab under tongue for chest pain.  May repeat after 5 minutes x 2.  DO NOT TAKE MORE THAN 3  TABS DURING AN EPISODE OF CHEST PAIN (Patient taking differently: Place 0.4 mg under the tongue every 5 (five) minutes as needed for chest pain. DO NOT TAKE MORE THAN 3 TABS DURING AN EPISODE OF CHEST PAIN)  . nystatin (MYCOSTATIN/NYSTOP) powder Apply topically 2 (two) times daily.  . potassium chloride (K-DUR) 10 MEQ tablet Take 2 tablets (20 mEq total) by mouth 2 (two) times daily.  . TRUEPLUS LANCETS 28G MISC Use as directted   No current facility-administered medications for this visit.  (Other)      REVIEW OF SYSTEMS: ROS    Positive for: Endocrine, Eyes   Negative for: Constitutional, Gastrointestinal, Neurological, Skin, Genitourinary, Musculoskeletal, HENT, Cardiovascular, Respiratory, Psychiatric,  Allergic/Imm, Heme/Lymph   Last edited by Theodore Demark, COA on 04/06/2019  9:18 AM. (History)       ALLERGIES No Known Allergies  PAST MEDICAL HISTORY Past Medical History:  Diagnosis Date  . Adrenal adenoma, left   . Arthritis   . Cataract    Bilateral  . CKD (chronic kidney disease), stage II   . Coronary artery disease 07-28-2018 [facility]  currently due to no insurance   per cardiac cath 02-06-2015 (positive mild lateral ishcemia on stress test)--- dLAD 80%,  mLAD 40%,  mPDA 80% (small vessel),  ostial D1 70%,  CFx with lumial irregarlities-- medical management  . Depression   . Diabetic neuropathy ([place])   . History of Bell's palsy 07/2011   per pt residual facial pain on left side occasionally  . History of cancer of vagina 1999   per pt completed radiation and chemo  . History of sepsis 12/30/2017   positive blood culter fro E.coli  . Hyperlipidemia   . Hypertension   . Hypokalemia   . Insulin dependent type 2 diabetes mellitus, uncontrolled ([place])    followed by pcp---  A1c was 11.7 on 06-15-2018 in epic  . Myocardial infarction ([place])   . Nocturia   . Peripheral neuropathy   . PMB (postmenopausal bleeding)   . Wears dentures    upper  . Wears glasses    Past Surgical History:  Procedure Laterality Date  . BREAST BIOPSY  2012   benign  . CARDIAC CATHETERIZATION N/A 02/06/2015   Procedure: Left Heart Cath and Coronary Angiography;  Surgeon: Larey Dresser, MD;  Location: [facility];  Service: Cardiovascular;  Laterality: N/A;  . CATARACT EXTRACTION, BILATERAL    . HYSTEROSCOPY W/D&C N/A 08/01/2018   Procedure: DILATATION AND CURETTAGE /HYSTEROSCOPY;  Surgeon: Mora Bellman, MD;  Location: [facility];  Service: Gynecology;  Laterality: N/A;  . ROBOTIC ASSISTED TOTAL HYSTERECTOMY WITH BILATERAL SALPINGO OOPHERECTOMY N/A 11/28/2018   Procedure: XI ROBOTIC ASSISTED TOTAL HYSTERECTOMY WITH BILATERAL SALPINGO  OOPHORECTOMY;  Surgeon: Everitt Amber, MD;  Location: [facility];  Service: Gynecology;  Laterality: N/A;  . SENTINEL NODE BIOPSY N/A 11/28/2018   Procedure: SENTINEL NODE BIOPSY;  Surgeon: Everitt Amber, MD;  Location: [facility];  Service: Gynecology;  Laterality: N/A;  . UMBILICAL HERNIA REPAIR  child    FAMILY HISTORY Family History  Problem Relation Age of Onset  . Heart disease Mother   . Hypertension Mother   . Diabetes Mother   . Cancer Father        hodgkins lymphoma  . Heart disease Sister        heart attack  . Diabetes Sister   . Cancer Other        parent  . Diabetes  Other        parent  . Heart disease Other        parent  . Hyperlipidemia Other        parent  . Hypertension Other        parent  . Arthritis Other        parent  . Diabetes Maternal Aunt   . Diabetes Maternal Uncle   . Breast cancer Neg Hx     SOCIAL HISTORY Social History   Tobacco Use  . Smoking status: Never Smoker  . Smokeless tobacco: Never Used  Substance Use Topics  . Alcohol use: Not Currently  . Drug use: Not Currently    Types: Marijuana    Comment: 07-28-2018  per pt last smoked 11/21/2018         OPHTHALMIC EXAM:  Base Eye Exam    Visual Acuity (Snellen - Linear)      Right Left   Dist [place] 20/40 20/400 -1   Dist ph [place] NI NI  Patient uses readers. Hasn't got new rx for glasses yet.       Tonometry (Tonopen, 9:14 AM)      Right Left   Pressure 20 18       Pupils      Dark Light Shape React APD   Right 4 3 Round Brisk None   Left 4 3 Round Brisk None       Visual Fields (Counting fingers)      Left Right    Full Full       Extraocular Movement      Right Left    Full, Ortho Full, Ortho       Neuro/Psych    Oriented x3: Yes   Mood/Affect: Normal       Dilation    Both eyes: 1.0% Mydriacyl, 2.5% Phenylephrine @ 9:14 AM        Slit Lamp and Fundus Exam    Slit Lamp Exam      Right Left   Lids/Lashes Dermatochalasis - upper lid Dermatochalasis - upper  lid   Conjunctiva/Sclera Mild Melanosis Mild Melanosis   Cornea 1+ Punctate epithelial erosions 1+ Punctate epithelial erosions, well healed temporal cataract wounds   Anterior Chamber Deep, 0.5+cell/pigment Deep, 0.5+cell/pigment   Iris Round and dilated, No NVI Round and dilated, No NVI   Lens PC IOL in good position with trace PCO PC IOL in good position with trace PCO   Vitreous Vitreous syneresis Vitreous syneresis       Fundus Exam      Right Left   Disc Pink and Sharp Mild hyperema and elevation, Compact   C/D Ratio 0.1 0.1   Macula Blunted foveal reflex, +central edema, +Microaneurysms Blunted foveal reflex, central edema - slightly improved, +edudate/MA/IRH   Vessels Vascular attenuation, Tortuous, AV crossing changes Vascular attenuation, Tortuous, +venous beading, AV crossing changes   Periphery Attached, scattered drusen, scattered IRH   Attached, scattered DBH greatest posteriorly             IMAGING AND PROCEDURES  Imaging and Procedures for _0 @  OCT, Retina - OU - Both Eyes       Right Eye Quality was good. Central Foveal Thickness: 450. Progression has worsened. Findings include abnormal foveal contour, intraretinal fluid, no SRF, intraretinal hyper-reflective material, vitreomacular adhesion .   Left Eye Quality was good. Central Foveal Thickness: 460. Progression has improved. Findings include abnormal foveal contour, intraretinal fluid, subretinal fluid, intraretinal hyper-reflective material, vitreomacular adhesion  (  Mild interval improvement in IRF/SRF).   Notes *Images captured and stored on drive  Diagnosis / Impression:  Macular edema OU - likely combination of CME + DME OU OD slightly worse OS improved  Clinical management:  See below  Abbreviations: NFP - Normal foveal profile. CME - cystoid macular edema. PED - pigment epithelial detachment. IRF - intraretinal fluid. SRF - subretinal fluid. EZ - ellipsoid zone. ERM - epiretinal membrane.  ORA - outer retinal atrophy. ORT - outer retinal tubulation. SRHM - subretinal hyper-reflective material        Intravitreal Injection, Pharmacologic Agent - OD - Right Eye       Time Out 04/06/2019. 9:31 AM. Confirmed correct patient, procedure, site, and patient consented.   Anesthesia Topical anesthesia was used. Anesthetic medications included Proparacaine 0.5%.   Procedure Preparation included 5% betadine to ocular surface, eyelid speculum. A 30 gauge needle was used.   Injection:  1.25 mg Bevacizumab (AVASTIN) SOLN   NDC: 46962-952-84, Lot: [id number] , Expiration date: 06/13/2019   Route: Intravitreal, Site: Right Eye, Waste: 0 mL  Post-op Post injection exam found visual acuity of at least counting fingers. The patient tolerated the procedure well. There were no complications. The patient received written and verbal post procedure care education.                 ASSESSMENT/PLAN:    ICD-10-CM   1. Moderate nonproliferative diabetic retinopathy of both eyes with macular edema associated with type 2 diabetes mellitus (HCC)  A63.0160 Intravitreal Injection, Pharmacologic Agent - OD - Right Eye    Bevacizumab (AVASTIN) SOLN 1.25 mg  2. Retinal edema  H35.81 OCT, Retina - OU - Both Eyes  3. CME (cystoid macular edema), bilateral  H35.353   4. Essential hypertension  I10   5. Hypertensive retinopathy of both eyes  H35.033   6. Pseudophakia of both eyes  Z96.1     1-3. Moderate Non-proliferative diabetic retinopathy, OU OD with combination DME/CME  - exam shows scattered IRH, edema and tortuous blood vessels OU  - FA (10.23.20) shows leaking MA OU and paracentral petaloid hyperfluorecence OD --> CME/[place]  - OCT shows diabetic macular edema, OU; OD with CME/[place] component contributing as well  - s/p IVA OS #1 (10.23.20)  - BCVA improved to 20/400 OS from CF 4'  - OCT today shows interval improvement in macular edema OS; OD persistent/slightly  worse  - recommend IVA OD #1 today, 11.06.20  - cont Pred Forte and Ketorolac QID OU  - pt wishes to proceed  - RBA of procedure discussed, questions answered  - informed consent obtained and signed  - see procedure note  - f/u in 2 wks, possible IVA OS, then 4 wks after that to do injections OU  4,5. Hypertensive retinopathy OU  - discussed importance of tight BP control  - monitor  6. Pseudophakia OU  - s/p CE/IOL OU [place] Crane)   OD: 03/07/2019   OS: 02/28/2019  - IOLs in good position  - CME OD as above  - monitor   Ophthalmic Meds Ordered this visit:  Meds ordered this encounter  Medications  . Bevacizumab (AVASTIN) SOLN 1.25 mg       Return in about 2 weeks (around 04/20/2019) for f/u after 10/20 NPDR OU, DFE, OCT.  There are no Patient Instructions on file for this visit.   Explained the diagnoses, plan, and follow up with the patient and they expressed understanding.  Patient expressed understanding of the importance of proper  follow up care.   This document serves as a record of services personally performed by Gardiner Sleeper, MD, PhD. It was created on their behalf by Ernest Mallick, OA, an ophthalmic assistant. The creation of this record is the provider's dictation and/or activities during the visit.    Electronically signed by: Ernest Mallick, OA 10.30.2020 4:32 PM   Gardiner Sleeper, M.D., Ph.D. Diseases & Surgery of the Retina and Vitreous [facility]  I have reviewed the above documentation for accuracy and completeness, and I agree with the above. Gardiner Sleeper, M.D., Ph.D. 04/06/19 4:32 PM     Abbreviations: M myopia (nearsighted); A astigmatism; H hyperopia (farsighted); P presbyopia; Mrx spectacle prescription;  CTL contact lenses; OD right eye; OS left eye; OU both eyes  XT exotropia; ET esotropia; PEK punctate epithelial keratitis; PEE punctate epithelial erosions; DES dry eye syndrome; MGD meibomian gland dysfunction; ATs  artificial tears; PFAT's preservative free artificial tears; [place] nuclear sclerotic cataract; PSC posterior subcapsular cataract; ERM epi-retinal membrane; PVD posterior vitreous detachment; RD retinal detachment; DM diabetes mellitus; DR diabetic retinopathy; NPDR non-proliferative diabetic retinopathy; PDR proliferative diabetic retinopathy; CSME clinically significant macular edema; DME diabetic macular edema; dbh dot blot hemorrhages; CWS cotton wool spot; POAG primary open angle glaucoma; C/D cup-to-disc ratio; HVF humphrey visual field; GVF goldmann visual field; OCT optical coherence tomography; IOP intraocular pressure; BRVO Branch retinal vein occlusion; CRVO central retinal vein occlusion; CRAO central retinal artery occlusion; BRAO branch retinal artery occlusion; RT retinal tear; SB scleral buckle; PPV pars plana vitrectomy; VH Vitreous hemorrhage; PRP panretinal laser photocoagulation; IVK intravitreal kenalog; VMT vitreomacular traction; MH Macular hole;  NVD neovascularization of the disc; NVE neovascularization elsewhere; AREDS age related eye disease study; ARMD age related macular degeneration; POAG primary open angle glaucoma; EBMD epithelial/anterior basement membrane dystrophy; ACIOL anterior chamber intraocular lens; IOL intraocular lens; PCIOL posterior chamber intraocular lens; Phaco/IOL phacoemulsification with intraocular lens placement; [place] photorefractive keratectomy; LASIK laser assisted in situ keratomileusis; HTN hypertension; DM diabetes mellitus; COPD chronic obstructive pulmonary disease

## 2019-04-04 MED FILL — NITROFURANTOIN MONO-MCR 100: 100 | 7 days supply | Qty: 14 | Fill #0

## 2019-04-06 ENCOUNTER — Ambulatory Visit (INDEPENDENT_AMBULATORY_CARE_PROVIDER_SITE_OTHER): Payer: Medicaid Other | Admitting: Ophthalmology

## 2019-04-06 ENCOUNTER — Encounter (INDEPENDENT_AMBULATORY_CARE_PROVIDER_SITE_OTHER): Payer: Self-pay | Admitting: Ophthalmology

## 2019-04-06 ENCOUNTER — Ambulatory Visit: Payer: Medicaid Other | Admitting: Internal Medicine

## 2019-04-06 DIAGNOSIS — H35353 Cystoid macular degeneration, bilateral: Secondary | ICD-10-CM | POA: Diagnosis not present

## 2019-04-06 DIAGNOSIS — H3581 Retinal edema: Secondary | ICD-10-CM

## 2019-04-06 DIAGNOSIS — E113313 Type 2 diabetes mellitus with moderate nonproliferative diabetic retinopathy with macular edema, bilateral: Secondary | ICD-10-CM | POA: Diagnosis not present

## 2019-04-06 DIAGNOSIS — I10 Essential (primary) hypertension: Secondary | ICD-10-CM

## 2019-04-06 DIAGNOSIS — Z961 Presence of intraocular lens: Secondary | ICD-10-CM

## 2019-04-06 DIAGNOSIS — H35033 Hypertensive retinopathy, bilateral: Secondary | ICD-10-CM

## 2019-04-06 MED ORDER — BEVACIZUMAB CHEMO INJECTION 1.25MG/0.05ML SYRINGE FOR KALEIDOSCOPE
1.2500 mg | INTRAVITREAL | Status: AC | PRN
  Administered 2019-04-06: 1.25 mg via INTRAVITREAL

## 2019-04-09 MED FILL — ISOSORBIDE MN ER 60 MG TAB: 60 | 90 days supply | Qty: 90 | Fill #5

## 2019-04-09 MED FILL — ATORVASTATIN CALCIUM 40 MG: 40 | 30 days supply | Qty: 30 | Fill #2

## 2019-04-18 NOTE — Progress Notes (Signed)
[facility] Note  04/20/2019     CHIEF COMPLAINT Patient presents for Retina Follow Up   HISTORY OF PRESENT ILLNESS: Kimberly Frazier is a 58 y.o. female who presents to the clinic today for:   HPI    Retina Follow Up    Patient presents with  Diabetic Retinopathy.  In both eyes.  This started 2 months ago.  Since onset it is stable.  I, the attending physician,  performed the HPI with the patient and updated documentation appropriately.          Comments    F/U NDPR OU. Patient states her vision has been "good", denies new visual issues/onsets. Bs 138 (04/19/19), denies hypo/hyperglycemic episodes.        Last edited by Bernarda Caffey, MD on 04/20/2019  1:58 PM. (History)    pt states she is doing well, she has not had any problems with   Referring physician: Rutherford Guys, [place],  [place] [address]  HISTORICAL INFORMATION:   Selected notes from the MEDICAL RECORD NUMBER Referred by Dr. Rutherford Guys for concern of CME  LEE:  Ocular Hx- PMH-   CURRENT MEDICATIONS: Current Outpatient Medications (Ophthalmic Drugs)  Medication Sig  . Bromfenac Sodium (PROLENSA) 0.07 % SOLN Place 1 drop into both eyes 4 (four) times daily.  Marland Kitchen ketorolac (ACULAR) 0.5 % ophthalmic solution Place 1 drop into both eyes 4 (four) times daily.   No current facility-administered medications for this visit.  (Ophthalmic Drugs)   Current Outpatient Medications (Other)  Medication Sig  . acetaminophen (TYLENOL) 500 MG tablet Take 1,000 mg by mouth every 6 (six) hours as needed for moderate pain or headache.  Marland Kitchen amLODipine (NORVASC) 5 MG tablet Take 1 tablet (5 mg total) by mouth daily.  Marland Kitchen aspirin EC 81 MG tablet Take 1 tablet (81 mg total) daily by mouth.  Marland Kitchen atorvastatin (LIPITOR) 40 MG tablet TAKE 1 TABLET DAILY BY MOUTH.  . BD PEN NEEDLE NANO U/F 32G X 4 MM MISC USE TWICE DAILY AS DIRECTED  . Blood Glucose Monitoring Suppl (TRUE METRIX METER)  w/Device KIT Use as directed  . carvedilol (COREG) 25 MG tablet TAKE 1 TABLET (25 MG TOTAL) BY MOUTH 2 (TWO) TIMES DAILY. NEEDS TO MAKE APPOINTMENT.  . Dulaglutide (TRULICITY) 1.5 OJ/5.0KX SOPN Inject 1.5 mg into the skin once a week.  . gabapentin (NEURONTIN) 300 MG capsule Take 1 capsule (300 mg total) by mouth at bedtime.  Marland Kitchen glucose blood (TRUE METRIX BLOOD GLUCOSE TEST) test strip Use as instructed  . HUMALOG 100 UNIT/ML injection INJECT 28 UNITS INTO THE SKIN THREE TIMES DAILY BEFORE MEALS.  Marland Kitchen Insulin Glargine (LANTUS SOLOSTAR) 100 UNIT/ML Solostar Pen Inject 55 Units into the skin daily. (Patient taking differently: Inject 54 Units into the skin at bedtime. )  . Insulin Pen Needle (ULTICARE MICRO PEN NEEDLES) 32G X 4 MM MISC Use as directed with insulin  . nitroGLYCERIN (NITROSTAT) 0.4 MG SL tablet Place 1 tab under tongue for chest pain.  May repeat after 5 minutes x 2.  DO NOT TAKE MORE THAN 3 TABS DURING AN EPISODE OF CHEST PAIN (Patient taking differently: Place 0.4 mg under the tongue every 5 (five) minutes as needed for chest pain. DO NOT TAKE MORE THAN 3 TABS DURING AN EPISODE OF CHEST PAIN)  . nystatin (MYCOSTATIN/NYSTOP) powder Apply topically 2 (two) times daily.  . potassium chloride (K-DUR) 10 MEQ tablet Take 2 tablets (20 mEq  total) by mouth 2 (two) times daily.  . TRUEPLUS LANCETS 28G MISC Use as directted  . isosorbide mononitrate (IMDUR) 60 MG 24 hr tablet Take 1 tablet (60 mg total) by mouth daily.   No current facility-administered medications for this visit.  (Other)      REVIEW OF SYSTEMS: ROS    Positive for: Endocrine, Eyes   Negative for: Constitutional, Gastrointestinal, Neurological, Skin, Genitourinary, Musculoskeletal, HENT, Cardiovascular, Respiratory, Psychiatric, Allergic/Imm, Heme/Lymph   Last edited by Zenovia Jordan, LPN on 16/55/3748  2:70 AM. (History)       ALLERGIES No Known Allergies  PAST MEDICAL HISTORY Past Medical History:  Diagnosis  Date  . Adrenal adenoma, left   . Arthritis   . Cataract    Bilateral  . CKD (chronic kidney disease), stage II   . Coronary artery disease 07-28-2018 followed [facility]  currently due to no insurance   per cardiac cath 02-06-2015 (positive mild lateral ishcemia on stress test)--- dLAD 80%,  mLAD 40%,  mPDA 80% (small vessel),  ostial D1 70%,  CFx with lumial irregarlities-- medical management  . Depression   . Diabetic neuropathy ([place])   . History of Bell's palsy 07/2011   per pt residual facial pain on left side occasionally  . History of cancer of vagina 1999   per pt completed radiation and chemo  . History of sepsis 12/30/2017   positive blood culter fro E.coli  . Hyperlipidemia   . Hypertension   . Hypokalemia   . Insulin dependent type 2 diabetes mellitus, uncontrolled ([place])    followed by pcp---  A1c was 11.7 on 06-15-2018 in epic  . Myocardial infarction ([place])   . Nocturia   . Peripheral neuropathy   . PMB (postmenopausal bleeding)   . Wears dentures    upper  . Wears glasses    Past Surgical History:  Procedure Laterality Date  . BREAST BIOPSY  2012   benign  . CARDIAC CATHETERIZATION N/A 02/06/2015   Procedure: Left Heart Cath and Coronary Angiography;  Surgeon: Larey Dresser, MD;  Location: [facility];  Service: Cardiovascular;  Laterality: N/A;  . CATARACT EXTRACTION, BILATERAL    . HYSTEROSCOPY W/D&C N/A 08/01/2018   Procedure: DILATATION AND CURETTAGE /HYSTEROSCOPY;  Surgeon: Mora Bellman, MD;  Location: [facility];  Service: Gynecology;  Laterality: N/A;  . ROBOTIC ASSISTED TOTAL HYSTERECTOMY WITH BILATERAL SALPINGO OOPHERECTOMY N/A 11/28/2018   Procedure: XI ROBOTIC ASSISTED TOTAL HYSTERECTOMY WITH BILATERAL SALPINGO OOPHORECTOMY;  Surgeon: Everitt Amber, MD;  Location: [facility];  Service: Gynecology;  Laterality: N/A;  . SENTINEL NODE BIOPSY N/A 11/28/2018   Procedure: SENTINEL NODE BIOPSY;  Surgeon: Everitt Amber, MD;   Location: [facility];  Service: Gynecology;  Laterality: N/A;  . UMBILICAL HERNIA REPAIR  child    FAMILY HISTORY Family History  Problem Relation Age of Onset  . Heart disease Mother   . Hypertension Mother   . Diabetes Mother   . Cancer Father        hodgkins lymphoma  . Heart disease Sister        heart attack  . Diabetes Sister   . Cancer Other        parent  . Diabetes Other        parent  . Heart disease Other        parent  . Hyperlipidemia Other        parent  . Hypertension Other  parent  . Arthritis Other        parent  . Diabetes Maternal Aunt   . Diabetes Maternal Uncle   . Breast cancer Neg Hx     SOCIAL HISTORY Social History   Tobacco Use  . Smoking status: Never Smoker  . Smokeless tobacco: Never Used  Substance Use Topics  . Alcohol use: Not Currently  . Drug use: Not Currently    Types: Marijuana    Comment: 07-28-2018  per pt last smoked 11/21/2018         OPHTHALMIC EXAM:  Base Eye Exam    Visual Acuity (Snellen - Linear)      Right Left   Dist [place] 20/25 +2 20/400   Dist ph [place] NI 20/300       Tonometry (Tonopen, 9:21 AM)      Right Left   Pressure 12 18       Pupils      Dark Light Shape React APD   Right 3 2 Round Brisk None   Left 3 2 Round Brisk None       Visual Fields      Left Right    Full Full       Neuro/Psych    Oriented x3: Yes   Mood/Affect: Normal       Dilation    Both eyes: 1.0% Mydriacyl, 2.5% Phenylephrine @ 9:21 AM        Slit Lamp and Fundus Exam    Slit Lamp Exam      Right Left   Lids/Lashes Dermatochalasis - upper lid Dermatochalasis - upper lid   Conjunctiva/Sclera Mild Melanosis Mild Melanosis   Cornea 1+ Punctate epithelial erosions 1+ Punctate epithelial erosions, well healed temporal cataract wounds   Anterior Chamber Deep, 0.5+cell/pigment Deep, 0.5+cell/pigment   Iris Round and dilated, No NVI Round and dilated, No NVI   Lens PC IOL in good position with trace PCO PC IOL in good  position with trace PCO   Vitreous Vitreous syneresis Vitreous syneresis       Fundus Exam      Right Left   Disc Pink and Sharp Compact, Pink and Sharp   C/D Ratio 0.2 0.1   Macula Blunted foveal reflex, +central edema - improved, +Microaneurysms greatest inferiorly Blunted foveal reflex, central edema - slightly improved, +edudate/MA/IRH -- persistent   Vessels Vascular attenuation, Tortuous Vascular attenuation, Tortuous, +venous beading, AV crossing changes   Periphery Attached, scattered drusen, scattered IRH   Attached, scattered DBH greatest posteriorly             IMAGING AND PROCEDURES  Imaging and Procedures for @TODAY @  OCT, Retina - OU - Both Eyes       Right Eye Quality was good. Central Foveal Thickness: 348. Progression has improved. Findings include abnormal foveal contour, intraretinal fluid, no SRF, intraretinal hyper-reflective material, vitreomacular adhesion  (Interval improvement in IRF).   Left Eye Quality was good. Central Foveal Thickness: 447. Progression has improved. Findings include abnormal foveal contour, intraretinal fluid, subretinal fluid, intraretinal hyper-reflective material, vitreomacular adhesion  (Mild interval improvement in IRF - still severe).   Notes *Images captured and stored on drive  Diagnosis / Impression:  Macular edema OU - likely combination of CME + DME OU - mildly improved OU  Clinical management:  See below  Abbreviations: NFP - Normal foveal profile. CME - cystoid macular edema. PED - pigment epithelial detachment. IRF - intraretinal fluid. SRF - subretinal fluid. EZ - ellipsoid zone. ERM -  epiretinal membrane. ORA - outer retinal atrophy. ORT - outer retinal tubulation. SRHM - subretinal hyper-reflective material        Intravitreal Injection, Pharmacologic Agent - OS - Left Eye       Time Out 04/20/2019. 9:26 AM. Confirmed correct patient, procedure, site, and patient consented.   Anesthesia Topical  anesthesia was used. Anesthetic medications included Lidocaine 2%, Proparacaine 0.5%.   Procedure Preparation included 5% betadine to ocular surface, eyelid speculum. A 30 gauge needle was used.   Injection:  1.25 mg Bevacizumab (AVASTIN) SOLN   NDC: 49201-007-12, Lot: [id number] , Expiration date: 07/06/2019   Route: Intravitreal, Site: Left Eye, Waste: 0 mg  Post-op Post injection exam found visual acuity of at least counting fingers. The patient tolerated the procedure well. There were no complications. The patient received written and verbal post procedure care education.                 ASSESSMENT/PLAN:    ICD-10-CM   1. Moderate nonproliferative diabetic retinopathy of both eyes with macular edema associated with type 2 diabetes mellitus (HCC)  15/09/2019 Intravitreal Injection, Pharmacologic Agent - OS - Left Eye    Bevacizumab (AVASTIN) SOLN 1.25 mg  2. Retinal edema  H35.81 OCT, Retina - OU - Both Eyes  3. CME (cystoid macular edema), bilateral  H35.353   4. Essential hypertension  I10   5. Hypertensive retinopathy of both eyes  H35.033   6. Pseudophakia of both eyes  Z96.1     1-3. Moderate Non-proliferative diabetic retinopathy, OU OD with combination DME/CME  - exam shows scattered IRH, edema and tortuous blood vessels OU  - FA (10.23.20) shows leaking MA OU and paracentral petaloid hyperfluorecence OD --> CME/[place]  - OCT shows diabetic macular edema, OU; OD with CME/[place] component contributing as well  - s/p IVA OS #1 (10.23.20)  - s/p IVA OD #1 (11.06.20)  - BCVA improved to 20/300 OS from 20/400  - OCT today shows interval improvement in macular edema OU  - recommend IVA OS #1 today, 11.18.20  - cont Pred Forte and Ketorolac QID OU  - pt wishes to proceed  - RBA of procedure discussed, questions answered  - informed consent obtained and signed  - see procedure note  - f/u in 4 weeks, DFE, OCT, possible injection OU  4,5. Hypertensive  retinopathy OU  - discussed importance of tight BP control  - monitor  6. Pseudophakia OU  - s/p CE/IOL OU 12.01.20)   OD: 03/07/2019   OS: 02/28/2019  - IOLs in good position  - CME OD as above  - monitor   Ophthalmic Meds Ordered this visit:  Meds ordered this encounter  Medications  . Bevacizumab (AVASTIN) SOLN 1.25 mg       Return in about 4 weeks (around 05/18/2019) for f/u NPDR OU, DFE, OCT.  There are no Patient Instructions on file for this visit.   Explained the diagnoses, plan, and follow up with the patient and they expressed understanding.  Patient expressed understanding of the importance of proper follow up care.   This document serves as a record of services personally performed by 05/31/2019, MD, PhD. It was created on their behalf by Gardiner Sleeper, OA, an ophthalmic assistant. The creation of this record is the provider's dictation and/or activities during the visit.    Electronically signed by: Ernest Mallick, OA 11.18.2020 3:35 PM   12.01.2020, M.D., Ph.D. Diseases & Surgery of the Retina and Vitreous [facility]  I have reviewed the above documentation for accuracy and completeness, and I agree with the above. Gardiner Sleeper, M.D., Ph.D. 04/20/19 3:35 PM    Abbreviations: M myopia (nearsighted); A astigmatism; H hyperopia (farsighted); P presbyopia; Mrx spectacle prescription;  CTL contact lenses; OD right eye; OS left eye; OU both eyes  XT exotropia; ET esotropia; PEK punctate epithelial keratitis; PEE punctate epithelial erosions; DES dry eye syndrome; MGD meibomian gland dysfunction; ATs artificial tears; PFAT's preservative free artificial tears; [place] nuclear sclerotic cataract; PSC posterior subcapsular cataract; ERM epi-retinal membrane; PVD posterior vitreous detachment; RD retinal detachment; DM diabetes mellitus; DR diabetic retinopathy; NPDR non-proliferative diabetic retinopathy; PDR proliferative diabetic  retinopathy; CSME clinically significant macular edema; DME diabetic macular edema; dbh dot blot hemorrhages; CWS cotton wool spot; POAG primary open angle glaucoma; C/D cup-to-disc ratio; HVF humphrey visual field; GVF goldmann visual field; OCT optical coherence tomography; IOP intraocular pressure; BRVO Branch retinal vein occlusion; CRVO central retinal vein occlusion; CRAO central retinal artery occlusion; BRAO branch retinal artery occlusion; RT retinal tear; SB scleral buckle; PPV pars plana vitrectomy; VH Vitreous hemorrhage; PRP panretinal laser photocoagulation; IVK intravitreal kenalog; VMT vitreomacular traction; MH Macular hole;  NVD neovascularization of the disc; NVE neovascularization elsewhere; AREDS age related eye disease study; ARMD age related macular degeneration; POAG primary open angle glaucoma; EBMD epithelial/anterior basement membrane dystrophy; ACIOL anterior chamber intraocular lens; IOL intraocular lens; PCIOL posterior chamber intraocular lens; Phaco/IOL phacoemulsification with intraocular lens placement; [place] photorefractive keratectomy; LASIK laser assisted in situ keratomileusis; HTN hypertension; DM diabetes mellitus; COPD chronic obstructive pulmonary disease

## 2019-04-20 ENCOUNTER — Ambulatory Visit (INDEPENDENT_AMBULATORY_CARE_PROVIDER_SITE_OTHER): Payer: Medicaid Other | Admitting: Ophthalmology

## 2019-04-20 ENCOUNTER — Encounter (INDEPENDENT_AMBULATORY_CARE_PROVIDER_SITE_OTHER): Payer: Self-pay | Admitting: Ophthalmology

## 2019-04-20 ENCOUNTER — Other Ambulatory Visit: Payer: Self-pay

## 2019-04-20 DIAGNOSIS — E113313 Type 2 diabetes mellitus with moderate nonproliferative diabetic retinopathy with macular edema, bilateral: Secondary | ICD-10-CM

## 2019-04-20 DIAGNOSIS — H35353 Cystoid macular degeneration, bilateral: Secondary | ICD-10-CM

## 2019-04-20 DIAGNOSIS — Z961 Presence of intraocular lens: Secondary | ICD-10-CM

## 2019-04-20 DIAGNOSIS — H35033 Hypertensive retinopathy, bilateral: Secondary | ICD-10-CM

## 2019-04-20 DIAGNOSIS — H3581 Retinal edema: Secondary | ICD-10-CM

## 2019-04-20 DIAGNOSIS — I10 Essential (primary) hypertension: Secondary | ICD-10-CM | POA: Diagnosis not present

## 2019-04-20 MED ORDER — BEVACIZUMAB CHEMO INJECTION 1.25MG/0.05ML SYRINGE FOR KALEIDOSCOPE
1.2500 mg | INTRAVITREAL | Status: AC | PRN
  Administered 2019-04-20: 1.25 mg via INTRAVITREAL

## 2019-05-03 ENCOUNTER — Other Ambulatory Visit: Payer: Self-pay | Admitting: Internal Medicine

## 2019-05-03 DIAGNOSIS — IMO0002 Reserved for concepts with insufficient information to code with codable children: Secondary | ICD-10-CM

## 2019-05-03 DIAGNOSIS — E1142 Type 2 diabetes mellitus with diabetic polyneuropathy: Secondary | ICD-10-CM

## 2019-05-03 DIAGNOSIS — I2510 Atherosclerotic heart disease of native coronary artery without angina pectoris: Secondary | ICD-10-CM

## 2019-05-03 MED FILL — AMLODIPINE BESYLATE 5 MG TA: 5 | 90 days supply | Qty: 90 | Fill #2

## 2019-05-04 MED FILL — POTASSIUM CHLORIDE ER 10 ME: 10 | 90 days supply | Qty: 180 | Fill #0

## 2019-05-09 ENCOUNTER — Other Ambulatory Visit: Payer: Self-pay | Admitting: Internal Medicine

## 2019-05-09 DIAGNOSIS — E1142 Type 2 diabetes mellitus with diabetic polyneuropathy: Secondary | ICD-10-CM

## 2019-05-09 DIAGNOSIS — IMO0002 Reserved for concepts with insufficient information to code with codable children: Secondary | ICD-10-CM

## 2019-05-11 MED FILL — LANTUS SOLOSTAR 100 UNITS/M: 100 | 81 days supply | Qty: 45 | Fill #0

## 2019-05-13 NOTE — Progress Notes (Signed)
[facility] Note  05/18/2019     CHIEF COMPLAINT Patient presents for Retina Follow Up   HISTORY OF PRESENT ILLNESS: Kimberly Frazier is a 58 y.o. female who presents to the clinic today for:   HPI    Retina Follow Up    Patient presents with  Diabetic Retinopathy.  Severity is moderate.  Duration of 4 weeks.  Since onset it is stable.  I, the attending physician,  performed the HPI with the patient and updated documentation appropriately.          Comments    Patient states vision the same OU. BS was 134 this am. Last a1c was 7.9 on 06.26.20. Using Pred Forte and ketorolac qid OU.       Last edited by Bernarda Caffey, MD on 05/18/2019 12:02 PM. (History)    pt states the past week of 2 her vision has been "in and out"   Referring physician: Ladell Pier, MD [place],  [place] [id number]  HISTORICAL INFORMATION:   Selected notes from the MEDICAL RECORD NUMBER Referred by Dr. Rutherford Guys for concern of CME  LEE:  Ocular Hx- PMH-   CURRENT MEDICATIONS: Current Outpatient Medications (Ophthalmic Drugs)  Medication Sig  . Bromfenac Sodium (PROLENSA) 0.07 % SOLN Place 1 drop into both eyes 4 (four) times daily.  Marland Kitchen ketorolac (ACULAR) 0.5 % ophthalmic solution Place 1 drop into both eyes 4 (four) times daily.  . prednisoLONE acetate (PRED FORTE) 1 % ophthalmic suspension Place 1 drop into both eyes 4 (four) times daily.   No current facility-administered medications for this visit. (Ophthalmic Drugs)   Current Outpatient Medications (Other)  Medication Sig  . acetaminophen (TYLENOL) 500 MG tablet Take 1,000 mg by mouth every 6 (six) hours as needed for moderate pain or headache.  Marland Kitchen amLODipine (NORVASC) 5 MG tablet Take 1 tablet (5 mg total) by mouth daily.  Marland Kitchen aspirin EC 81 MG tablet Take 1 tablet (81 mg total) daily by mouth.  Marland Kitchen atorvastatin (LIPITOR) 40 MG tablet TAKE 1 TABLET DAILY BY MOUTH.  . BD PEN NEEDLE NANO U/F 32G X 4  MM MISC USE TWICE DAILY AS DIRECTED  . Blood Glucose Monitoring Suppl (TRUE METRIX METER) w/Device KIT Use as directed  . carvedilol (COREG) 25 MG tablet TAKE 1 TABLET (25 MG TOTAL) BY MOUTH 2 (TWO) TIMES DAILY. NEEDS TO MAKE APPOINTMENT.  . Dulaglutide (TRULICITY) 1.5 VE/7.2CN SOPN Inject 1.5 mg into the skin once a week.  . gabapentin (NEURONTIN) 300 MG capsule Take 1 capsule (300 mg total) by mouth at bedtime.  Marland Kitchen glucose blood (TRUE METRIX BLOOD GLUCOSE TEST) test strip Use as instructed  . HUMALOG 100 UNIT/ML injection INJECT 28 UNITS INTO THE SKIN THREE TIMES DAILY BEFORE MEALS.  Marland Kitchen Insulin Pen Needle (ULTICARE MICRO PEN NEEDLES) 32G X 4 MM MISC Use as directed with insulin  . LANTUS SOLOSTAR 100 UNIT/ML Solostar Pen INJECT 55 UNITS INTO THE SKIN DAILY.  . nitroGLYCERIN (NITROSTAT) 0.4 MG SL tablet Place 1 tab under tongue for chest pain.  May repeat after 5 minutes x 2.  DO NOT TAKE MORE THAN 3 TABS DURING AN EPISODE OF CHEST PAIN (Patient taking differently: Place 0.4 mg under the tongue every 5 (five) minutes as needed for chest pain. DO NOT TAKE MORE THAN 3 TABS DURING AN EPISODE OF CHEST PAIN)  . nystatin (MYCOSTATIN/NYSTOP) powder Apply topically 2 (two) times daily.  . potassium chloride (K-DUR)  10 MEQ tablet Take 2 tablets (20 mEq total) by mouth 2 (two) times daily.  . TRUEPLUS LANCETS 28G MISC Use as directted  . isosorbide mononitrate (IMDUR) 60 MG 24 hr tablet Take 1 tablet (60 mg total) by mouth daily.   No current facility-administered medications for this visit. (Other)      REVIEW OF SYSTEMS: ROS    Positive for: Endocrine, Eyes   Negative for: Constitutional, Gastrointestinal, Neurological, Skin, Genitourinary, Musculoskeletal, HENT, Cardiovascular, Respiratory, Psychiatric, Allergic/Imm, Heme/Lymph   Last edited by Roselee Nova D, COT on 05/18/2019 10:01 AM. (History)       ALLERGIES No Known Allergies  PAST MEDICAL HISTORY Past Medical History:  Diagnosis  Date  . Adrenal adenoma, left   . Arthritis   . Cataract    Bilateral  . CKD (chronic kidney disease), stage II   . Coronary artery disease 07-28-2018 [facility]  currently due to no insurance   per cardiac cath 02-06-2015 (positive mild lateral ishcemia on stress test)--- dLAD 80%,  mLAD 40%,  mPDA 80% (small vessel),  ostial D1 70%,  CFx with lumial irregarlities-- medical management  . Depression   . Diabetic neuropathy ([place])   . History of Bell's palsy 07/2011   per pt residual facial pain on left side occasionally  . History of cancer of vagina 1999   per pt completed radiation and chemo  . History of sepsis 12/30/2017   positive blood culter fro E.coli  . Hyperlipidemia   . Hypertension   . Hypokalemia   . Insulin dependent type 2 diabetes mellitus, uncontrolled ([place])    followed by pcp---  A1c was 11.7 on 06-15-2018 in epic  . Myocardial infarction ([place])   . Nocturia   . Peripheral neuropathy   . PMB (postmenopausal bleeding)   . Wears dentures    upper  . Wears glasses    Past Surgical History:  Procedure Laterality Date  . BREAST BIOPSY  2012   benign  . CARDIAC CATHETERIZATION N/A 02/06/2015   Procedure: Left Heart Cath and Coronary Angiography;  Surgeon: Larey Dresser, MD;  Location: [place] [facility];  Service: Cardiovascular;  Laterality: N/A;  . CATARACT EXTRACTION, BILATERAL    . HYSTEROSCOPY WITH D & C N/A 08/01/2018   Procedure: DILATATION AND CURETTAGE /HYSTEROSCOPY;  Surgeon: Mora Bellman, MD;  Location: [facility];  Service: Gynecology;  Laterality: N/A;  . ROBOTIC ASSISTED TOTAL HYSTERECTOMY WITH BILATERAL SALPINGO OOPHERECTOMY N/A 11/28/2018   Procedure: XI ROBOTIC ASSISTED TOTAL HYSTERECTOMY WITH BILATERAL SALPINGO OOPHORECTOMY;  Surgeon: Everitt Amber, MD;  Location: [facility];  Service: Gynecology;  Laterality: N/A;  . SENTINEL NODE BIOPSY N/A 11/28/2018   Procedure: SENTINEL NODE BIOPSY;  Surgeon: Everitt Amber,  MD;  Location: [facility];  Service: Gynecology;  Laterality: N/A;  . UMBILICAL HERNIA REPAIR  child    FAMILY HISTORY Family History  Problem Relation Age of Onset  . Heart disease Mother   . Hypertension Mother   . Diabetes Mother   . Cancer Father        hodgkins lymphoma  . Heart disease Sister        heart attack  . Diabetes Sister   . Cancer Other        parent  . Diabetes Other        parent  . Heart disease Other        parent  . Hyperlipidemia Other        parent  .  Hypertension Other        parent  . Arthritis Other        parent  . Diabetes Maternal Aunt   . Diabetes Maternal Uncle   . Breast cancer Neg Hx     SOCIAL HISTORY Social History   Tobacco Use  . Smoking status: Never Smoker  . Smokeless tobacco: Never Used  Substance Use Topics  . Alcohol use: Not Currently  . Drug use: Not Currently    Types: Marijuana    Comment: 07-28-2018  per pt last smoked 11/21/2018         OPHTHALMIC EXAM:  Base Eye Exam    Visual Acuity (Snellen - Linear)      Right Left   Dist [place] 20/40 20/300 -2   Dist ph [place] 20/30 20/300 +1   Correction: Glasses       Tonometry (Tonopen, 10:12 AM)      Right Left   Pressure 20 21       Pupils      Dark Light Shape React APD   Right 3 2 Round Brisk None   Left 3 2 Round Brisk None       Visual Fields (Counting fingers)      Left Right    Full Full       Extraocular Movement      Right Left    Full, Ortho Full, Ortho       Neuro/Psych    Oriented x3: Yes   Mood/Affect: Normal       Dilation    Both eyes: 1.0% Mydriacyl, 2.5% Phenylephrine @ 10:12 AM        Slit Lamp and Fundus Exam    Slit Lamp Exam      Right Left   Lids/Lashes Dermatochalasis - upper lid Dermatochalasis - upper lid   Conjunctiva/Sclera Mild Melanosis Mild Melanosis   Cornea 1+ Punctate epithelial erosions 1+ Punctate epithelial erosions, well healed temporal cataract wounds   Anterior Chamber Deep, 0.5+cell/pigment Deep,  0.5+cell/pigment   Iris Round and dilated, No NVI Round and dilated, No NVI   Lens PC IOL in good position with trace PCO PC IOL in good position with trace PCO   Vitreous Vitreous syneresis Vitreous syneresis       Fundus Exam      Right Left   Disc Pink and Sharp Compact, Pink and Sharp, mild hyperemia   C/D Ratio 0.2 0.1   Macula Blunted foveal reflex, +central edema - persistent, +Microaneurysms greatest inferiorly and nasally Blunted foveal reflex, central edema - slightly improved, +edudate/MA/IRH -- slighlty improved   Vessels Vascular attenuation, Tortuous Vascular attenuation, Tortuous, +venous beading, AV crossing changes   Periphery Attached, scattered drusen, scattered IRH, mild macroaneurysm temporally (0230 equator) Attached, scattered DBH greatest posteriorly           Refraction    Manifest Refraction      Sphere Cylinder Axis Dist VA   Right +0.50 +0.25 120 20/30   Left -0.50 +0.50 120 20/250-2          IMAGING AND PROCEDURES  Imaging and Procedures for @TODAY @  OCT, Retina - OU - Both Eyes       Right Eye Quality was good. Central Foveal Thickness: 329. Progression has been stable. Findings include abnormal foveal contour, intraretinal fluid, no SRF, intraretinal hyper-reflective material, vitreomacular adhesion  (persistent IRF).   Left Eye Quality was good. Central Foveal Thickness: 409. Progression has improved. Findings include abnormal foveal contour, intraretinal  fluid, subretinal fluid, intraretinal hyper-reflective material, vitreomacular adhesion  (Mild interval improvement in IRF and foveal profile).   Notes *Images captured and stored on drive  Diagnosis / Impression:  Macular edema OU - likely combination of CME + DME OU - mildly improved OU  Clinical management:  See below  Abbreviations: NFP - Normal foveal profile. CME - cystoid macular edema. PED - pigment epithelial detachment. IRF - intraretinal fluid. SRF - subretinal fluid. EZ -  ellipsoid zone. ERM - epiretinal membrane. ORA - outer retinal atrophy. ORT - outer retinal tubulation. SRHM - subretinal hyper-reflective material        Intravitreal Injection, Pharmacologic Agent - OD - Right Eye       Time Out 05/18/2019. 10:40 AM. Confirmed correct patient, procedure, site, and patient consented.   Anesthesia Topical anesthesia was used. Anesthetic medications included Lidocaine 2%, Proparacaine 0.5%.   Procedure Preparation included 5% betadine to ocular surface, eyelid speculum. A supplied needle was used.   Injection:  1.25 mg Bevacizumab (AVASTIN) SOLN   NDC: 50539-767-34, Lot: [id number] , Expiration date: 07/04/2019   Route: Intravitreal, Site: Right Eye, Waste: 0 mL  Post-op Post injection exam found visual acuity of at least counting fingers. The patient tolerated the procedure well. There were no complications. The patient received written and verbal post procedure care education.        Intravitreal Injection, Pharmacologic Agent - OS - Left Eye       Time Out 05/18/2019. 10:41 AM. Confirmed correct patient, procedure, site, and patient consented.   Anesthesia Topical anesthesia was used. Anesthetic medications included Lidocaine 2%, Proparacaine 0.5%.   Procedure Preparation included 5% betadine to ocular surface, eyelid speculum. A 30 gauge needle was used.   Injection:  1.25 mg Bevacizumab (AVASTIN) SOLN   NDC: 05/31/2019, Lot: [id number] , Expiration date: 07/31/2019   Route: Intravitreal, Site: Left Eye, Waste: 0 mL  Post-op Post injection exam found visual acuity of at least counting fingers. The patient tolerated the procedure well. There were no complications. The patient received written and verbal post procedure care education.                 ASSESSMENT/PLAN:    ICD-10-CM   1. Moderate nonproliferative diabetic retinopathy of both eyes with macular edema associated with type 2 diabetes mellitus (HCC)  : [id number]  Intravitreal Injection, Pharmacologic Agent - OD - Right Eye    Intravitreal Injection, Pharmacologic Agent - OS - Left Eye    Bevacizumab (AVASTIN) SOLN 1.25 mg    Bevacizumab (AVASTIN) SOLN 1.25 mg  2. Retinal edema  H35.81 OCT, Retina - OU - Both Eyes  3. CME (cystoid macular edema), bilateral  H35.353   4. Essential hypertension  I10   5. Hypertensive retinopathy of both eyes  H35.033   6. Pseudophakia of both eyes  Z96.1     1-3. Moderate Non-proliferative diabetic retinopathy, OU OD with combination DME/CME  - exam shows scattered IRH, edema and tortuous blood vessels OU  - FA (10.23.20) shows leaking MA OU and paracentral petaloid hyperfluorecence OD --> CME/[place]  - OCT shows diabetic macular edema, OU; OD with CME/[place] component contributing as well  - s/p IVA OS #1 (10.23.20), #2 (11.18.20)  - s/p IVA OD #1 (11.06.20)  - BCVA decreased OD from 20/25 to 20/30; OS stable at 20/300  - OCT today shows interval improvement in macular edema OU  - recommend IVA OU (OD #2 and OS #3) today, 12.18.20  - pt wishes to proceed  -  RBA of procedure discussed, questions answered  - informed consent obtained and signed  - see procedure note  - cont Pred Forte and Ketorolac QID OU  - f/u in 4 weeks, DFE, OCT, possible injection OU  4,5. Hypertensive retinopathy OU  - discussed importance of tight BP control  - monitor  6. Pseudophakia OU  - s/p CE/IOL OU Gershon Crane)   OD: 03/07/2019   OS: 02/28/2019  - IOLs in good position  - CME OD as above  - monitor   Ophthalmic Meds Ordered this visit:  Meds ordered this encounter  Medications  . prednisoLONE acetate (PRED FORTE) 1 % ophthalmic suspension    Sig: Place 1 drop into both eyes 4 (four) times daily.    Dispense:  10 mL    Refill:  2  . ketorolac (ACULAR) 0.5 % ophthalmic solution    Sig: Place 1 drop into both eyes 4 (four) times daily.    Dispense:  10 mL    Refill:  2  . Bevacizumab (AVASTIN) SOLN 1.25 mg  .  Bevacizumab (AVASTIN) SOLN 1.25 mg       Return in about 4 weeks (around 06/15/2019) for f/u NPDR OU, DFE, OCT.  There are no Patient Instructions on file for this visit.   Explained the diagnoses, plan, and follow up with the patient and they expressed understanding.  Patient expressed understanding of the importance of proper follow up care.   This document serves as a record of services personally performed by Gardiner Sleeper, MD, PhD. It was created on their behalf by Ernest Mallick, OA, an ophthalmic assistant. The creation of this record is the provider's dictation and/or activities during the visit.    Electronically signed by: Ernest Mallick, OA 12.23.2020 1:56 AM   Gardiner Sleeper, M.D., Ph.D. Diseases & Surgery of the Retina and Vitreous [facility]  I have reviewed the above documentation for accuracy and completeness, and I agree with the above. Gardiner Sleeper, M.D., Ph.D. 05/19/19 1:56 AM     Abbreviations: M myopia (nearsighted); A astigmatism; H hyperopia (farsighted); P presbyopia; Mrx spectacle prescription;  CTL contact lenses; OD right eye; OS left eye; OU both eyes  XT exotropia; ET esotropia; PEK punctate epithelial keratitis; PEE punctate epithelial erosions; DES dry eye syndrome; MGD meibomian gland dysfunction; ATs artificial tears; PFAT's preservative free artificial tears; [place] nuclear sclerotic cataract; PSC posterior subcapsular cataract; ERM epi-retinal membrane; PVD posterior vitreous detachment; RD retinal detachment; DM diabetes mellitus; DR diabetic retinopathy; NPDR non-proliferative diabetic retinopathy; PDR proliferative diabetic retinopathy; CSME clinically significant macular edema; DME diabetic macular edema; dbh dot blot hemorrhages; CWS cotton wool spot; POAG primary open angle glaucoma; C/D cup-to-disc ratio; HVF humphrey visual field; GVF goldmann visual field; OCT optical coherence tomography; IOP intraocular pressure; BRVO  Branch retinal vein occlusion; CRVO central retinal vein occlusion; CRAO central retinal artery occlusion; BRAO branch retinal artery occlusion; RT retinal tear; SB scleral buckle; PPV pars plana vitrectomy; VH Vitreous hemorrhage; PRP panretinal laser photocoagulation; IVK intravitreal kenalog; VMT vitreomacular traction; MH Macular hole;  NVD neovascularization of the disc; NVE neovascularization elsewhere; AREDS age related eye disease study; ARMD age related macular degeneration; POAG primary open angle glaucoma; EBMD epithelial/anterior basement membrane dystrophy; ACIOL anterior chamber intraocular lens; IOL intraocular lens; PCIOL posterior chamber intraocular lens; Phaco/IOL phacoemulsification with intraocular lens placement; [place] photorefractive keratectomy; LASIK laser assisted in situ keratomileusis; HTN hypertension; DM diabetes mellitus; COPD chronic obstructive pulmonary disease

## 2019-05-18 ENCOUNTER — Encounter (INDEPENDENT_AMBULATORY_CARE_PROVIDER_SITE_OTHER): Payer: Self-pay | Admitting: Ophthalmology

## 2019-05-18 ENCOUNTER — Ambulatory Visit (INDEPENDENT_AMBULATORY_CARE_PROVIDER_SITE_OTHER): Payer: Medicaid Other | Admitting: Ophthalmology

## 2019-05-18 ENCOUNTER — Other Ambulatory Visit: Payer: Self-pay

## 2019-05-18 DIAGNOSIS — H35033 Hypertensive retinopathy, bilateral: Secondary | ICD-10-CM

## 2019-05-18 DIAGNOSIS — H3581 Retinal edema: Secondary | ICD-10-CM

## 2019-05-18 DIAGNOSIS — Z961 Presence of intraocular lens: Secondary | ICD-10-CM

## 2019-05-18 DIAGNOSIS — I10 Essential (primary) hypertension: Secondary | ICD-10-CM | POA: Diagnosis not present

## 2019-05-18 DIAGNOSIS — H35353 Cystoid macular degeneration, bilateral: Secondary | ICD-10-CM | POA: Diagnosis not present

## 2019-05-18 DIAGNOSIS — E113313 Type 2 diabetes mellitus with moderate nonproliferative diabetic retinopathy with macular edema, bilateral: Secondary | ICD-10-CM

## 2019-05-18 MED ORDER — PREDNISOLONE ACETATE 1 % OP SUSP: 1.0000 [drp] | Freq: Four times a day (QID) | OPHTHALMIC | 2 refills | Status: DC

## 2019-05-18 MED ORDER — KETOROLAC TROMETHAMINE 0.5 % OP SOLN: 1.0000 [drp] | Freq: Four times a day (QID) | OPHTHALMIC | 2 refills | Status: DC

## 2019-05-18 MED FILL — PREDNISOLONE AC 1% EYE DROP: 1 | 18 days supply | Qty: 10 | Fill #0

## 2019-05-18 MED FILL — KETOROLAC 0.5% OPHTH SOLN: 0.5 | 37 days supply | Qty: 10 | Fill #0

## 2019-05-19 MED ORDER — BEVACIZUMAB CHEMO INJECTION 1.25MG/0.05ML SYRINGE FOR KALEIDOSCOPE
1.2500 mg | INTRAVITREAL | Status: AC | PRN
  Administered 2019-05-19: 1.25 mg via INTRAVITREAL

## 2019-05-22 MED FILL — LANTUS SOLOSTAR 100 UNITS/M: 100 | 81 days supply | Qty: 45 | Fill #0

## 2019-05-28 ENCOUNTER — Other Ambulatory Visit: Payer: Self-pay | Admitting: Internal Medicine

## 2019-05-28 DIAGNOSIS — I2510 Atherosclerotic heart disease of native coronary artery without angina pectoris: Secondary | ICD-10-CM

## 2019-05-28 MED FILL — ATORVASTATIN CALCIUM 40 MG: 40 | 30 days supply | Qty: 30 | Fill #0

## 2019-06-11 ENCOUNTER — Other Ambulatory Visit: Payer: Self-pay | Admitting: Internal Medicine

## 2019-06-11 DIAGNOSIS — I10 Essential (primary) hypertension: Secondary | ICD-10-CM

## 2019-06-11 DIAGNOSIS — I2510 Atherosclerotic heart disease of native coronary artery without angina pectoris: Secondary | ICD-10-CM

## 2019-06-12 MED FILL — CARVEDILOL 25 MG TABLET: 25 | 30 days supply | Qty: 60 | Fill #0

## 2019-06-15 ENCOUNTER — Encounter (INDEPENDENT_AMBULATORY_CARE_PROVIDER_SITE_OTHER): Payer: Medicaid Other | Admitting: Ophthalmology

## 2019-06-19 ENCOUNTER — Encounter: Payer: Self-pay | Admitting: Gynecologic Oncology

## 2019-06-19 ENCOUNTER — Inpatient Hospital Stay: Payer: Medicaid Other | Attending: Gynecologic Oncology | Admitting: Gynecologic Oncology

## 2019-06-19 ENCOUNTER — Other Ambulatory Visit: Payer: Self-pay

## 2019-06-19 VITALS — BP 123/79 | HR 97 | Temp 98.3°F | Resp 20 | Ht 64.0 in | Wt 268.1 lb

## 2019-06-19 DIAGNOSIS — I2510 Atherosclerotic heart disease of native coronary artery without angina pectoris: Secondary | ICD-10-CM | POA: Insufficient documentation

## 2019-06-19 DIAGNOSIS — I129 Hypertensive chronic kidney disease with stage 1 through stage 4 chronic kidney disease, or unspecified chronic kidney disease: Secondary | ICD-10-CM | POA: Diagnosis not present

## 2019-06-19 DIAGNOSIS — Z923 Personal history of irradiation: Secondary | ICD-10-CM

## 2019-06-19 DIAGNOSIS — E1136 Type 2 diabetes mellitus with diabetic cataract: Secondary | ICD-10-CM | POA: Diagnosis not present

## 2019-06-19 DIAGNOSIS — N182 Chronic kidney disease, stage 2 (mild): Secondary | ICD-10-CM | POA: Insufficient documentation

## 2019-06-19 DIAGNOSIS — Z7982 Long term (current) use of aspirin: Secondary | ICD-10-CM | POA: Insufficient documentation

## 2019-06-19 DIAGNOSIS — Z90722 Acquired absence of ovaries, bilateral: Secondary | ICD-10-CM | POA: Diagnosis not present

## 2019-06-19 DIAGNOSIS — Z79899 Other long term (current) drug therapy: Secondary | ICD-10-CM | POA: Insufficient documentation

## 2019-06-19 DIAGNOSIS — I252 Old myocardial infarction: Secondary | ICD-10-CM | POA: Diagnosis not present

## 2019-06-19 DIAGNOSIS — Z90710 Acquired absence of both cervix and uterus: Secondary | ICD-10-CM | POA: Insufficient documentation

## 2019-06-19 DIAGNOSIS — Z6841 Body Mass Index (BMI) 40.0 and over, adult: Secondary | ICD-10-CM | POA: Diagnosis not present

## 2019-06-19 DIAGNOSIS — C541 Malignant neoplasm of endometrium: Secondary | ICD-10-CM | POA: Diagnosis not present

## 2019-06-19 DIAGNOSIS — E1122 Type 2 diabetes mellitus with diabetic chronic kidney disease: Secondary | ICD-10-CM | POA: Diagnosis not present

## 2019-06-19 DIAGNOSIS — E7800 Pure hypercholesterolemia, unspecified: Secondary | ICD-10-CM | POA: Insufficient documentation

## 2019-06-19 DIAGNOSIS — E1142 Type 2 diabetes mellitus with diabetic polyneuropathy: Secondary | ICD-10-CM | POA: Insufficient documentation

## 2019-06-19 DIAGNOSIS — E1165 Type 2 diabetes mellitus with hyperglycemia: Secondary | ICD-10-CM | POA: Diagnosis not present

## 2019-06-19 DIAGNOSIS — Z794 Long term (current) use of insulin: Secondary | ICD-10-CM | POA: Insufficient documentation

## 2019-06-19 NOTE — Patient Instructions (Signed)
Please notify Dr Avagail Whittlesey at phone number [phone] if you notice vaginal bleeding, new pelvic or abdominal pains, bloating, feeling full easy, or a change in bladder or bowel function.   Please return to see Dr Donnell Beauchamp in 3 months. 

## 2019-06-19 NOTE — Progress Notes (Signed)
Follow-up Note: Gyn-Onc  Consult was initially requested by Dr. Elly Modena for the evaluation of Kimberly Frazier 59 y.o. female  CC:  Chief Complaint  Patient presents with  . Endometrial cancer ([place])    follow up    Assessment/Plan:  [name]  is a 59 y.o.  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 High/intermediate risk factors for recurrence.  Adjuvant radiation was not possible due to prior radiation and adjuvant chemotherapy was declined due to medical comorbidities.   Urinary incontinence - recommend continued follow-up with Dr Matilde Sprang.   I will see her back for follow-up in 3 months.   HPI: [name] is a 59 year old P2 who is seen in consultation at the request of Dr Elly Modena for grade 3 endometrial cancer.  The patient reports a history of light vaginal spotting for approximately 1 year.  Since January 2020 she developed heavy vaginal bleeding and this prompted her to see Dr. Elly Modena who performed a transvaginal ultrasound on June 20, 2018.  This revealed a uterus measuring 7.6 x 3.1 x 4.9 cm, there was a hypoechoic masslike area in the cervix measuring 1.6 x 1.4 x 1.9 cm.  The endometrium was thickened at 14 mm.  It contained an endometrial mass measuring 2.6 cm in greatest dimension.  She was taken to the operating room on August 01, 2018 with Dr. Elly Modena.  She performed the hysteroscopy D&C.  Intraoperative findings were significant for an 8-week size uterus, diffuse proliferative endometrium, but no mention of an endometrial mass.  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  Of note the patient has a history of a Pap smear in January 2020 that was normal with no high risk  HPV detected.  The patient's medical history is complex.  She has a remote history of a gynecologic malignancy in 1999.  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  She describes treatment with primary brachii therapy (she describes being in bed in the hospital for 5 days with radiation internally).  She is unsure if she received chemotherapy.  She did not receive surgery for this.  She received follow-up for approximately a year after treatment and then discontinued.  Her medical history is also significant for coronary artery disease.  The patient had what she describes as a light myocardial infarction in 2016.  This was managed with a coronary angiography, however no stent was placed.  She did not receive ongoing cardiology evaluation as the patient reports that she did not have health insurance.  She was initially treated with medical therapy.  Her only medical therapy at the time of this diagnosis with aspirin 81 mg daily.  Patient has a history of type 2 diabetes mellitus for which she takes insulin.  She reports poor blood glucose control.  She reports not particularly being compliant with her diet.  She has sequelae of diabetic neuropathy in hands and feet and chronic kidney disease.  She has hypertension and hypercholesterolemia.  The patient is morbidly obese with a BMI of 48 kg/m.  Her prior abdominal surgery includes 1 prior cesarean section and, as a child, a midline laparotomy for a hernia repair.  Her family history is significant for father who had a diagnosis of chins  lymphoma, and a brother had a history of colon cancer.  Her CA 125 was elevated to 12% in March, 2020.  She was seen by cardiology who felt that she was moderate/high risk for perioperative cardiac events but that she was maximally optimized for a hysterectomy and did not advocate for CABG beforehand.  CT abd/pelvis on 08/17/18 showed no apparent extrauterine disease. Small ground glass opacities were  seen in the lower lungs -recommended 6 month follow-up.  She has been optimizing her blood glucose control.   The patient underwent robotic assisted total hysterectomy, BSO, SLN biopsy on 11/28/18. Final pathology revealed a high grade serous carcinoma of the endometrium with inner half (5 of 30m) invasion and no LVSI or cervical involvment. SLN's were negative as were the adnexa. She was assigned a stage IA grade 3 serous carcinoma. Her 2 equivocal, and MSI stable, MMR normal.   Due to the high grade nature of her pathology she met criteria for adjuvant therapy with either chemotherapy, vaginal brachytherapy or both. However, she is not a candidate for additional radiation due to her prior radiation for cervical cancer, and has medical comorbidities which would make delivery of chemotherapy challenging.   Postoperatively she did well. Though she developed stress urinary incontinence that was significant.   She was offered adjuvant chemotherapy but declined due to medical comorbidities and anticipated morbidity.  She was referred to Dr MMatilde Sprangfor urinary incontinence.    Interval Hx:  She has no concerning symptoms for recurrence.   Current Meds:  Outpatient Encounter Medications as of 06/19/2019  Medication Sig  . acetaminophen (TYLENOL) 500 MG tablet Take 1,000 mg by mouth every 6 (six) hours as needed for moderate pain or headache.  .Marland KitchenamLODipine (NORVASC) 5 MG tablet Take 1 tablet (5 mg total) by mouth daily.  .Marland Kitchenaspirin EC 81 MG tablet Take 1 tablet (81 mg total) daily by mouth.  .Marland Kitchenatorvastatin (LIPITOR) 40 MG tablet TAKE 1 TABLET DAILY BY MOUTH.  . BD PEN NEEDLE NANO U/F 32G X 4 MM MISC USE TWICE DAILY AS DIRECTED  . Blood Glucose Monitoring Suppl (TRUE METRIX METER) w/Device KIT Use as directed  . Bromfenac Sodium (PROLENSA) 0.07 % SOLN Place 1 drop into both eyes 4 (four) times daily.  . carvedilol (COREG) 25 MG tablet TAKE 1 TABLET (25 MG TOTAL) BY MOUTH 2 (TWO) TIMES DAILY.  NEEDS TO MAKE APPOINTMENT.  . Dulaglutide (TRULICITY) 1.5 MNI/6.2VOSOPN Inject 1.5 mg into the skin once a week.  . gabapentin (NEURONTIN) 300 MG capsule Take 1 capsule (300 mg total) by mouth at bedtime.  .Marland Kitchenglucose blood (TRUE METRIX BLOOD GLUCOSE TEST) test strip Use as instructed  . HUMALOG 100 UNIT/ML injection INJECT 28 UNITS INTO THE SKIN THREE TIMES DAILY BEFORE MEALS.  .Marland KitchenInsulin Pen Needle (ULTICARE MICRO PEN NEEDLES) 32G X 4 MM MISC Use as directed with insulin  . isosorbide mononitrate (IMDUR) 60 MG 24 hr tablet Take 1 tablet (60 mg total) by mouth daily.  .Marland KitchenLANTUS SOLOSTAR 100 UNIT/ML Solostar Pen INJECT 55 UNITS INTO THE SKIN DAILY.  . nitroGLYCERIN (NITROSTAT) 0.4 MG SL tablet Place 1 tab under tongue for chest pain.  May repeat after 5 minutes x 2.  DO NOT TAKE MORE THAN 3 TABS DURING AN EPISODE OF CHEST PAIN (Patient taking differently: Place 0.4 mg under the tongue every 5 (five) minutes as needed for chest pain. DO NOT TAKE MORE THAN 3 TABS DURING AN EPISODE OF CHEST PAIN)  .  nystatin (MYCOSTATIN/NYSTOP) powder Apply topically 2 (two) times daily. (Patient taking differently: Apply topically as needed. )  . potassium chloride (K-DUR) 10 MEQ tablet Take 2 tablets (20 mEq total) by mouth 2 (two) times daily.  . TRUEPLUS LANCETS 28G MISC Use as directted  . [DISCONTINUED] ketorolac (ACULAR) 0.5 % ophthalmic solution Place 1 drop into both eyes 4 (four) times daily.  . [DISCONTINUED] prednisoLONE acetate (PRED FORTE) 1 % ophthalmic suspension Place 1 drop into both eyes 4 (four) times daily.   No facility-administered encounter medications on file as of 06/19/2019.    Allergy: No Known Allergies  Social Hx:   Social History   Socioeconomic History  . Marital status: Single    Spouse name: Not on file  . Number of children: 2  . Years of education: 64  . Highest education level: Not on file  Occupational History  . Occupation: retired  Tobacco Use  . Smoking status: Never  Smoker  . Smokeless tobacco: Never Used  Substance and Sexual Activity  . Alcohol use: Not Currently  . Drug use: Not Currently    Types: Marijuana    Comment: 07-28-2018  per pt last smoked 11/21/2018  . Sexual activity: Yes  Other Topics Concern  . Not on file  Social History Narrative   Regular exercise-no   No caffeine use   Social Determinants of Health   Financial Resource Strain:   . Difficulty of Paying Living Expenses: Not on file  Food Insecurity: Food Insecurity Present  . Worried About Charity fundraiser in the Last Year: Often true  . Ran Out of Food in the Last Year: Sometimes true  Transportation Needs: No Transportation Needs  . Lack of Transportation (Medical): No  . Lack of Transportation (Non-Medical): No  Physical Activity:   . Days of Exercise per Week: Not on file  . Minutes of Exercise per Session: Not on file  Stress:   . Feeling of Stress : Not on file  Social Connections:   . Frequency of Communication with Friends and Family: Not on file  . Frequency of Social Gatherings with Friends and Family: Not on file  . Attends Religious Services: Not on file  . Active Member of Clubs or Organizations: Not on file  . Attends Archivist Meetings: Not on file  . Marital Status: Not on file  Intimate Partner Violence:   . Fear of Current or Ex-Partner: Not on file  . Emotionally Abused: Not on file  . Physically Abused: Not on file  . Sexually Abused: Not on file    Past Surgical Hx:  Past Surgical History:  Procedure Laterality Date  . BREAST BIOPSY  2012   benign  . CARDIAC CATHETERIZATION N/A 02/06/2015   Procedure: Left Heart Cath and Coronary Angiography;  Surgeon: Larey Dresser, MD;  Location: [facility];  Service: Cardiovascular;  Laterality: N/A;  . CATARACT EXTRACTION, BILATERAL    . HYSTEROSCOPY WITH D & C N/A 08/01/2018   Procedure: DILATATION AND CURETTAGE /HYSTEROSCOPY;  Surgeon: Mora Bellman, MD;  Location: [facility];  Service: Gynecology;  Laterality: N/A;  . ROBOTIC ASSISTED TOTAL HYSTERECTOMY WITH BILATERAL SALPINGO OOPHERECTOMY N/A 11/28/2018   Procedure: XI ROBOTIC ASSISTED TOTAL HYSTERECTOMY WITH BILATERAL SALPINGO OOPHORECTOMY;  Surgeon: Everitt Amber, MD;  Location: [facility];  Service: Gynecology;  Laterality: N/A;  . SENTINEL NODE BIOPSY N/A 11/28/2018   Procedure: SENTINEL NODE BIOPSY;  Surgeon: Everitt Amber, MD;  Location: [facility];  Service: Gynecology;  Laterality: N/A;  . UMBILICAL HERNIA REPAIR  child    Past Medical Hx:  Past Medical History:  Diagnosis Date  . Adrenal adenoma, left   . Arthritis   . Cataract    Bilateral  . CKD (chronic kidney disease), stage II   . Coronary artery disease 07-28-2018 [facility]  currently due to no insurance   per cardiac cath 02-06-2015 (positive mild lateral ishcemia on stress test)--- dLAD 80%,  mLAD 40%,  mPDA 80% (small vessel),  ostial D1 70%,  CFx with lumial irregarlities-- medical management  . Depression   . Diabetic neuropathy ([place])   . History of Bell's palsy 07/2011   per pt residual facial pain on left side occasionally  . History of cancer of vagina 1999   per pt completed radiation and chemo  . History of sepsis 12/30/2017   positive blood culter fro E.coli  . Hyperlipidemia   . Hypertension   . Hypokalemia   . Insulin dependent type 2 diabetes mellitus, uncontrolled ([place])    followed by pcp---  A1c was 11.7 on 06-15-2018 in epic  . Myocardial infarction ([place])   . Nocturia   . Peripheral neuropathy   . PMB (postmenopausal bleeding)   . Wears dentures    upper  . Wears glasses     Past Gynecological History:  See HPI No LMP recorded. Patient is postmenopausal.  Family Hx:  Family History  Problem Relation Age of Onset  . Heart disease Mother   . Hypertension Mother   . Diabetes Mother   . Cancer Father        hodgkins lymphoma  . Heart disease Sister        heart attack  .  Diabetes Sister   . Cancer Other        parent  . Diabetes Other        parent  . Heart disease Other        parent  . Hyperlipidemia Other        parent  . Hypertension Other        parent  . Arthritis Other        parent  . Diabetes Maternal Aunt   . Diabetes Maternal Uncle   . Breast cancer Neg Hx     Review of Systems:  Constitutional  Feels well,    ENT Normal appearing ears and nares bilaterally Skin/Breast  No rash, sores, jaundice, itching, dryness Cardiovascular  No chest pain, shortness of breath, or edema  Pulmonary  No cough or wheeze.  Gastro Intestinal  No nausea, vomitting, or diarrhoea. No bright red blood per rectum, no abdominal pain, change in bowel movement, or constipation.  Genito Urinary  No frequency, urgency, dysuria, + incontinence Musculo Skeletal  No myalgia, arthralgia, joint swelling or pain  Neurologic  No weakness, numbness, change in gait,  Psychology  No depression, anxiety, insomnia.   Vitals:  Blood pressure 123/79, pulse 97, temperature 98.3 F (36.8 C), temperature source Temporal, resp. rate 20, height 5' 4"  (1.626 m), weight 268 lb 2 oz (121.6 kg), SpO2 99 %.  Physical Exam: WD in NAD Neck  Supple NROM, without any enlargements.  Lymph Node Survey No cervical supraclavicular or inguinal adenopathy Cardiovascular  Pulse normal rate, regularity and rhythm. S1 and S2 normal.  Lungs  Clear to auscultation bilateraly, without wheezes/crackles/rhonchi. Good air movement.  Skin  No rash/lesions/breakdown  Psychiatry  Alert and oriented to person, place, and  time  Abdomen  Normoactive bowel sounds, abdomen soft, non-tender and obese without evidence of hernia. Soft incisions Back No CVA tenderness Genito Urinary  Vulva/vagina: Normal external female genitalia.  No lesions. No discharge or bleeding.  Bladder/urethra:  No lesions or masses, + increased urethral mobility  Vagina: well healed vaginal cuff, no lesions or  masses or blood  Cervix: Normal appearing, no lesions. Rectal  deferred  Extremities   No bilateral cyanosis, clubbing or edema.  Thereasa Solo, MD  06/19/2019, 3:03 PM

## 2019-07-04 ENCOUNTER — Other Ambulatory Visit: Payer: Self-pay | Admitting: Internal Medicine

## 2019-07-04 ENCOUNTER — Telehealth: Payer: Self-pay

## 2019-07-04 DIAGNOSIS — E1159 Type 2 diabetes mellitus with other circulatory complications: Secondary | ICD-10-CM

## 2019-07-04 MED FILL — CARVEDILOL 25 MG TABLET: 25 | 30 days supply | Qty: 60 | Fill #0

## 2019-07-04 NOTE — Telephone Encounter (Signed)
Pt's Trulicity is not covered under Medicaid-if appropriate can you change to Victoza?

## 2019-07-05 MED ORDER — LIRAGLUTIDE 18 MG/3ML ~~LOC~~ SOPN: PEN_INJECTOR | SUBCUTANEOUS | 0 refills | Status: DC

## 2019-07-05 MED FILL — HumaLOG 100 UNIT/ML SOLN: 100 | 23 days supply | Qty: 20 | Fill #0

## 2019-07-05 NOTE — Addendum Note (Signed)
Addended by: Karle Plumber B on: 07/05/2019 09:44 PM   Modules accepted: Orders

## 2019-07-06 ENCOUNTER — Telehealth: Payer: Self-pay | Admitting: Pharmacist

## 2019-07-06 ENCOUNTER — Other Ambulatory Visit: Payer: Self-pay | Admitting: Internal Medicine

## 2019-07-06 MED FILL — VICTOZA 2-PAK 18 MG/3 ML PE: 18 | 18 days supply | Qty: 6 | Fill #0

## 2019-07-06 NOTE — Telephone Encounter (Signed)
Will attempt to contact her this morning.

## 2019-07-06 NOTE — Telephone Encounter (Signed)
Call placed to patient this morning. I introduced myself and the purpose for the call. I informed her of her Medicaid not covering Trulicity. We discussed that we will have to switch her to Victoza instead. She was hesitant to do so initially but was amenable after counseling was provided. I also informed her that she is due for a visit with Dr. Wynetta Emery. She informs me that she will call back to make an appointment.

## 2019-07-09 ENCOUNTER — Encounter (INDEPENDENT_AMBULATORY_CARE_PROVIDER_SITE_OTHER): Payer: Medicaid Other | Admitting: Ophthalmology

## 2019-07-10 MED FILL — TRUEPLUS PEN NDL 32GX5/32: 32G X 4 MM | 25 days supply | Qty: 100 | Fill #0

## 2019-07-12 ENCOUNTER — Telehealth: Payer: Self-pay | Admitting: *Deleted

## 2019-07-12 NOTE — Telephone Encounter (Signed)
07/12/19 LMOM RE; FOLLOW UP VISIT D.Octave Montrose

## 2019-07-13 ENCOUNTER — Ambulatory Visit: Payer: Medicaid Other | Attending: Internal Medicine | Admitting: Internal Medicine

## 2019-07-13 ENCOUNTER — Other Ambulatory Visit: Payer: Self-pay

## 2019-07-13 ENCOUNTER — Encounter: Payer: Self-pay | Admitting: Internal Medicine

## 2019-07-13 VITALS — BP 126/80 | HR 93 | Ht 64.0 in | Wt 272.0 lb

## 2019-07-13 DIAGNOSIS — Z833 Family history of diabetes mellitus: Secondary | ICD-10-CM | POA: Insufficient documentation

## 2019-07-13 DIAGNOSIS — Z794 Long term (current) use of insulin: Secondary | ICD-10-CM | POA: Diagnosis not present

## 2019-07-13 DIAGNOSIS — I2510 Atherosclerotic heart disease of native coronary artery without angina pectoris: Secondary | ICD-10-CM | POA: Insufficient documentation

## 2019-07-13 DIAGNOSIS — Z6841 Body Mass Index (BMI) 40.0 and over, adult: Secondary | ICD-10-CM | POA: Diagnosis not present

## 2019-07-13 DIAGNOSIS — Z79899 Other long term (current) drug therapy: Secondary | ICD-10-CM | POA: Insufficient documentation

## 2019-07-13 DIAGNOSIS — Z7982 Long term (current) use of aspirin: Secondary | ICD-10-CM | POA: Insufficient documentation

## 2019-07-13 DIAGNOSIS — Z8542 Personal history of malignant neoplasm of other parts of uterus: Secondary | ICD-10-CM | POA: Diagnosis not present

## 2019-07-13 DIAGNOSIS — Z2821 Immunization not carried out because of patient refusal: Secondary | ICD-10-CM

## 2019-07-13 DIAGNOSIS — Z923 Personal history of irradiation: Secondary | ICD-10-CM | POA: Diagnosis not present

## 2019-07-13 DIAGNOSIS — E1159 Type 2 diabetes mellitus with other circulatory complications: Secondary | ICD-10-CM | POA: Diagnosis present

## 2019-07-13 DIAGNOSIS — E1142 Type 2 diabetes mellitus with diabetic polyneuropathy: Secondary | ICD-10-CM | POA: Diagnosis not present

## 2019-07-13 DIAGNOSIS — E113313 Type 2 diabetes mellitus with moderate nonproliferative diabetic retinopathy with macular edema, bilateral: Secondary | ICD-10-CM | POA: Diagnosis not present

## 2019-07-13 DIAGNOSIS — E785 Hyperlipidemia, unspecified: Secondary | ICD-10-CM | POA: Insufficient documentation

## 2019-07-13 DIAGNOSIS — Z90710 Acquired absence of both cervix and uterus: Secondary | ICD-10-CM | POA: Diagnosis not present

## 2019-07-13 DIAGNOSIS — I10 Essential (primary) hypertension: Secondary | ICD-10-CM | POA: Diagnosis not present

## 2019-07-13 DIAGNOSIS — D3502 Benign neoplasm of left adrenal gland: Secondary | ICD-10-CM | POA: Diagnosis not present

## 2019-07-13 DIAGNOSIS — Z90722 Acquired absence of ovaries, bilateral: Secondary | ICD-10-CM | POA: Diagnosis not present

## 2019-07-13 DIAGNOSIS — I119 Hypertensive heart disease without heart failure: Secondary | ICD-10-CM | POA: Diagnosis not present

## 2019-07-13 DIAGNOSIS — Z8349 Family history of other endocrine, nutritional and metabolic diseases: Secondary | ICD-10-CM | POA: Diagnosis not present

## 2019-07-13 DIAGNOSIS — Z1231 Encounter for screening mammogram for malignant neoplasm of breast: Secondary | ICD-10-CM

## 2019-07-13 LAB — POCT GLYCOSYLATED HEMOGLOBIN (HGB A1C): HbA1c, POC (controlled diabetic range): 7.9 % — AB (ref 0.0–7.0)

## 2019-07-13 LAB — GLUCOSE, POCT (MANUAL RESULT ENTRY): POC Glucose: 83 mg/dl (ref 70–99)

## 2019-07-13 MED ORDER — SITAGLIPTIN PHOSPHATE 25 MG PO TABS: 50.0000 mg | ORAL_TABLET | Freq: Every day | ORAL | 6 refills | Status: DC

## 2019-07-13 NOTE — Patient Instructions (Signed)
You should start the Victoza once you are done with the Trulicity. We have added an oral diabetic medication called Januvia for you to take once a day.   Diabetes Mellitus and Nutrition, Adult When you have diabetes (diabetes mellitus), it is very important to have healthy eating habits because your blood sugar (glucose) levels are greatly affected by what you eat and drink. Eating healthy foods in the appropriate amounts, at about the same times every day, can help you:  Control your blood glucose.  Lower your risk of heart disease.  Improve your blood pressure.  Reach or maintain a healthy weight. Every person with diabetes is different, and each person has different needs for a meal plan. Your health care provider may recommend that you work with a diet and nutrition specialist (dietitian) to make a meal plan that is best for you. Your meal plan may vary depending on factors such as:  The calories you need.  The medicines you take.  Your weight.  Your blood glucose, blood pressure, and cholesterol levels.  Your activity level.  Other health conditions you have, such as heart or kidney disease. How do carbohydrates affect me? Carbohydrates, also called carbs, affect your blood glucose level more than any other type of food. Eating carbs naturally raises the amount of glucose in your blood. Carb counting is a method for keeping track of how many carbs you eat. Counting carbs is important to keep your blood glucose at a healthy level, especially if you use insulin or take certain oral diabetes medicines. It is important to know how many carbs you can safely have in each meal. This is different for every person. Your dietitian can help you calculate how many carbs you should have at each meal and for each snack. Foods that contain carbs include:  Bread, cereal, rice, pasta, and crackers.  Potatoes and corn.  Peas, beans, and lentils.  Milk and yogurt.  Fruit and  juice.  Desserts, such as cakes, cookies, ice cream, and candy. How does alcohol affect me? Alcohol can cause a sudden decrease in blood glucose (hypoglycemia), especially if you use insulin or take certain oral diabetes medicines. Hypoglycemia can be a life-threatening condition. Symptoms of hypoglycemia (sleepiness, dizziness, and confusion) are similar to symptoms of having too much alcohol. If your health care provider says that alcohol is safe for you, follow these guidelines:  Limit alcohol intake to no more than 1 drink per day for nonpregnant women and 2 drinks per day for men. One drink equals 12 oz of beer, 5 oz of wine, or 1 oz of hard liquor.  Do not drink on an empty stomach.  Keep yourself hydrated with water, diet soda, or unsweetened iced tea.  Keep in mind that regular soda, juice, and other mixers may contain a lot of sugar and must be counted as carbs. What are tips for following this plan?  Reading food labels  Start by checking the serving size on the "Nutrition Facts" label of packaged foods and drinks. The amount of calories, carbs, fats, and other nutrients listed on the label is based on one serving of the item. Many items contain more than one serving per package.  Check the total grams (g) of carbs in one serving. You can calculate the number of servings of carbs in one serving by dividing the total carbs by 15. For example, if a food has 30 g of total carbs, it would be equal to 2 servings of carbs.  Check the number of grams (g) of saturated and trans fats in one serving. Choose foods that have low or no amount of these fats.  Check the number of milligrams (mg) of salt (sodium) in one serving. Most people should limit total sodium intake to less than 2,300 mg per day.  Always check the nutrition information of foods labeled as "low-fat" or "nonfat". These foods may be higher in added sugar or refined carbs and should be avoided.  Talk to your dietitian to  identify your daily goals for nutrients listed on the label. Shopping  Avoid buying canned, premade, or processed foods. These foods tend to be high in fat, sodium, and added sugar.  Shop around the outside edge of the grocery store. This includes fresh fruits and vegetables, bulk grains, fresh meats, and fresh dairy. Cooking  Use low-heat cooking methods, such as baking, instead of high-heat cooking methods like deep frying.  Cook using healthy oils, such as olive, canola, or sunflower oil.  Avoid cooking with butter, cream, or high-fat meats. Meal planning  Eat meals and snacks regularly, preferably at the same times every day. Avoid going long periods of time without eating.  Eat foods high in fiber, such as fresh fruits, vegetables, beans, and whole grains. Talk to your dietitian about how many servings of carbs you can eat at each meal.  Eat 4-6 ounces (oz) of lean protein each day, such as lean meat, chicken, fish, eggs, or tofu. One oz of lean protein is equal to: ? 1 oz of meat, chicken, or fish. ? 1 egg. ?  cup of tofu.  Eat some foods each day that contain healthy fats, such as avocado, nuts, seeds, and fish. Lifestyle  Check your blood glucose regularly.  Exercise regularly as told by your health care provider. This may include: ? 150 minutes of moderate-intensity or vigorous-intensity exercise each week. This could be brisk walking, biking, or water aerobics. ? Stretching and doing strength exercises, such as yoga or weightlifting, at least 2 times a week.  Take medicines as told by your health care provider.  Do not use any products that contain nicotine or tobacco, such as cigarettes and e-cigarettes. If you need help quitting, ask your health care provider.  Work with a Social worker or diabetes educator to identify strategies to manage stress and any emotional and social challenges. Questions to ask a health care provider  Do I need to meet with a diabetes  educator?  Do I need to meet with a dietitian?  What number can I call if I have questions?  When are the best times to check my blood glucose? Where to find more information:  American Diabetes Association: diabetes.org  Academy of Nutrition and Dietetics: www.eatright.[employer] of Diabetes and Digestive and Kidney Diseases (NIH): [employer] Summary  A healthy meal plan will help you control your blood glucose and maintain a healthy lifestyle.  Working with a diet and nutrition specialist (dietitian) can help you make a meal plan that is best for you.  Keep in mind that carbohydrates (carbs) and alcohol have immediate effects on your blood glucose levels. It is important to count carbs and to use alcohol carefully. This information is not intended to replace advice given to you by your health care provider. Make sure you discuss any questions you have with your health care provider. Document Revised: 04/29/2017 Document Reviewed: 06/21/2016 Elsevier Patient Education  2020 [employer].

## 2019-07-13 NOTE — Progress Notes (Signed)
Patient ID: Kimberly Frazier, female    DOB: [birth year]  MRN: [id number]  CC: Diabetes   Subjective: Kimberly Frazier is a 59 y.o. female who presents for chronic ds management Her concerns today include:  Patient with history of diabetes with neuropathy and retinopathy, HTN with hypertensive heart disease, CAD (Coronary CT revealed occlusion in the mid to distal LAD, distal RCA.  Cardiology states that she will need CABG eventually),HL, obesity endometrial CA s/p hysterectomy.  Uterine CA:  Had hysterectomy last year and f/u every 3 mths with her gynecologist.  Last seen 06/2019  DIABETES TYPE 2 Last A1C:   Results for orders placed or performed in visit on 07/13/19  POCT glucose (manual entry)  Result Value Ref Range   POC Glucose 83 70 - 99 mg/dl  POCT glycosylated hemoglobin (Hb A1C)  Result Value Ref Range   Hemoglobin A1C     HbA1c POC (<> result, manual entry)     HbA1c, POC (prediabetic range)     HbA1c, POC (controlled diabetic range) 7.9 (A) 0.0 - 7.0 %    Med Adherence:  _0  Yes.  On Lantus 54 units daily and Humalog 24-28 units with meals.  Was on Trulicity but Medicaid no longer covers.  Took last dose this wk. we have prescribed Victoza for her instead but she is wanting to know whether she could hold off on taking it since she feels her A1c is not that bad.   _1  No Medication side effects:  _2  Yes    _3  No Home Monitoring?  _4  Yes 2-3 x a wk before lunch    _5  No Home glucose results range: 120s Diet Adherence: _6  Yes  - does not do a lot of snack.  _7  No Exercise: _8  Yes -[place] time with her grandson. Hypoglycemic episodes?: _9  Yes    _10  No Numbness of the feet? _11  Yes    _12  No -not much anymore Retinopathy hx? _13  Yes  -sees the eye doctor regularly Last eye exam:  Comments  HTN/CAD:  Compliant with medications including aspirin, isosorbide, carvedilol and atorvastatin.  She  limits salt in foods No CP.  She has not had to  use sublingual nitroglycerin + SOB with exercise.  No lower extremity edema Last saw Dr. Harrell Gave 01/2019  Patient Active Problem List   Diagnosis Date Noted  . Endometrial cancer ([place]) 11/28/2018  . Hematuria 11/28/2018  . 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 History of radiation therapy 11/28/2018  . Diabetes mellitus ([place]) 10/06/2018  . Type 2 diabetes mellitus with hyperglycemia, with long-term current use of insulin ([place]) 10/06/2018  . Adrenal adenoma, left 06/15/2018  . 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 Postmenopausal vaginal bleeding 06/15/2018  . Hyperkalemia   . Diabetic peripheral neuropathy ([place]) 09/03/2016  . CAD (coronary artery disease) 04/10/2015  . Severe obesity (BMI >= 40) ([place]) 09/20/2013  . 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 Hyperlipidemia 10/25/2012  . Diabetes mellitus type 2, uncontrolled, with complications ([place]) 40/98/1191  . Essential hypertension 07/26/2012     Current Outpatient Medications on File Prior to Visit  Medication Sig Dispense Refill  . acetaminophen (TYLENOL) 500 MG tablet Take 1,000 mg by mouth every 6 (six) hours as needed for moderate pain or headache.    Marland Kitchen amLODipine (NORVASC) 5 MG tablet Take 1 tablet (5 mg total) by mouth daily. 90 tablet 3  . aspirin EC 81 MG tablet Take 1 tablet (81  mg total) daily by mouth. 100 tablet 1  . atorvastatin (LIPITOR) 40 MG tablet TAKE 1 TABLET DAILY BY MOUTH. 30 tablet 2  . Blood Glucose Monitoring Suppl (TRUE METRIX METER) w/Device KIT Use as directed 1 kit 0  . Bromfenac Sodium (PROLENSA) 0.07 % SOLN Place 1 drop into both eyes 4 (four) times daily. 6 mL 0  . carvedilol (COREG) 25 MG tablet TAKE 1 TABLET (25 MG TOTAL) BY MOUTH 2 (TWO) TIMES DAILY. NEEDS TO MAKE APPOINTMENT. 60 tablet 0  . gabapentin (NEURONTIN) 300 MG capsule Take 1 capsule (300 mg total) by mouth at bedtime. 90 capsule 3  . glucose blood (TRUE METRIX BLOOD GLUCOSE TEST) test strip Use as instructed 100 each 12  . insulin  lispro (HUMALOG) 100 UNIT/ML injection Inject 0.28 mLs (28 Units total) into the skin 3 (three) times daily with meals. Must have office visit for refills 20 mL 0  . LANTUS SOLOSTAR 100 UNIT/ML Solostar Pen INJECT 55 UNITS INTO THE SKIN DAILY. 45 mL 0  . liraglutide (VICTOZA) 18 MG/3ML SOPN 0.6 mg subcut daily x 1 wk then 1.2 mg daily thereafter. 6 mL 0  . nitroGLYCERIN (NITROSTAT) 0.4 MG SL tablet Place 1 tab under tongue for chest pain.  May repeat after 5 minutes x 2.  DO NOT TAKE MORE THAN 3 TABS DURING AN EPISODE OF CHEST PAIN (Patient taking differently: Place 0.4 mg under the tongue every 5 (five) minutes as needed for chest pain. DO NOT TAKE MORE THAN 3 TABS DURING AN EPISODE OF CHEST PAIN) 25 tablet 6  . nystatin (MYCOSTATIN/NYSTOP) powder Apply topically 2 (two) times daily. (Patient taking differently: Apply topically as needed. ) 15 g 0  . potassium chloride (K-DUR) 10 MEQ tablet Take 2 tablets (20 mEq total) by mouth 2 (two) times daily. 120 tablet 1  . TRUEPLUS LANCETS 28G MISC Use as directted 100 each 6  . TRUEPLUS PEN NEEDLES 32G X 4 MM MISC USE AS DIRECTED WITH INSULIN 100 each 0  . isosorbide mononitrate (IMDUR) 60 MG 24 hr tablet Take 1 tablet (60 mg total) by mouth daily. 90 tablet 3   No current facility-administered medications on file prior to visit.    No Known Allergies  Social History   Socioeconomic History  . Marital status: Single    Spouse name: Not on file  . Number of children: 2  . Years of education: 65  . Highest education level: Not on file  Occupational History  . Occupation: retired  Tobacco Use  . Smoking status: Never Smoker  . Smokeless tobacco: Never Used  Substance and Sexual Activity  . Alcohol use: Not Currently  . Drug use: Not Currently    Types: Marijuana    Comment: 07-28-2018  per pt last smoked 11/21/2018  . Sexual activity: Yes  Other Topics Concern  . Not on file  Social History Narrative   Regular exercise-no   No caffeine  use   Social Determinants of Health   Financial Resource Strain:   . Difficulty of Paying Living Expenses: Not on file  Food Insecurity: Food Insecurity Present  . Worried About Charity fundraiser in the Last Year: Often true  . Ran Out of Food in the Last Year: Sometimes true  Transportation Needs: No Transportation Needs  . Lack of Transportation (Medical): No  . Lack of Transportation (Non-Medical): No  Physical Activity:   . Days of Exercise per Week: Not on file  . Minutes of Exercise  per Session: Not on file  Stress:   . Feeling of Stress : Not on file  Social Connections:   . Frequency of Communication with Friends and Family: Not on file  . Frequency of Social Gatherings with Friends and Family: Not on file  . Attends Religious Services: Not on file  . Active Member of Clubs or Organizations: Not on file  . Attends Archivist Meetings: Not on file  . Marital Status: Not on file  Intimate Partner Violence:   . Fear of Current or Ex-Partner: Not on file  . Emotionally Abused: Not on file  . Physically Abused: Not on file  . Sexually Abused: Not on file    Family History  Problem Relation Age of Onset  . Heart disease Mother   . Hypertension Mother   . Diabetes Mother   . Cancer Father        hodgkins lymphoma  . Heart disease Sister        heart attack  . Diabetes Sister   . Cancer Other        parent  . Diabetes Other        parent  . Heart disease Other        parent  . Hyperlipidemia Other        parent  . Hypertension Other        parent  . Arthritis Other        parent  . Diabetes Maternal Aunt   . Diabetes Maternal Uncle   . Breast cancer Neg Hx     Past Surgical History:  Procedure Laterality Date  . BREAST BIOPSY  2012   benign  . CARDIAC CATHETERIZATION N/A 02/06/2015   Procedure: Left Heart Cath and Coronary Angiography;  Surgeon: Larey Dresser, MD;  Location: [facility];  Service: Cardiovascular;  Laterality: N/A;  .  CATARACT EXTRACTION, BILATERAL    . HYSTEROSCOPY WITH D & C N/A 08/01/2018   Procedure: DILATATION AND CURETTAGE /HYSTEROSCOPY;  Surgeon: Mora Bellman, MD;  Location: [facility];  Service: Gynecology;  Laterality: N/A;  . ROBOTIC ASSISTED TOTAL HYSTERECTOMY WITH BILATERAL SALPINGO OOPHERECTOMY N/A 11/28/2018   Procedure: XI ROBOTIC ASSISTED TOTAL HYSTERECTOMY WITH BILATERAL SALPINGO OOPHORECTOMY;  Surgeon: Everitt Amber, MD;  Location: [facility];  Service: Gynecology;  Laterality: N/A;  . SENTINEL NODE BIOPSY N/A 11/28/2018   Procedure: SENTINEL NODE BIOPSY;  Surgeon: Everitt Amber, MD;  Location: [facility];  Service: Gynecology;  Laterality: N/A;  . UMBILICAL HERNIA REPAIR  child    ROS: Review of Systems Negative except as stated above  PHYSICAL EXAM: BP 126/80   Pulse 93   Ht _0  (1.626 m)   Wt 272 lb (123.4 kg)   SpO2 96%   BMI 46.69 kg/m   Physical Exam  General appearance - alert, well appearing, obese African-American female and in no distress Mental status - normal mood, behavior, speech, dress, motor activity, and thought processes Eyes - pupils equal and reactive, extraocular eye movements intact Mouth - mucous membranes moist, pharynx normal without lesions Neck - supple, no significant adenopathy Chest - clear to auscultation, no wheezes, rales or rhonchi, symmetric air entry Heart - normal rate, regular rhythm, normal S1, S2, no murmurs, rubs, clicks or gallops Extremities -no lower extremity edema Diabetic Foot Exam - Simple   Simple Foot Form Visual Inspection No deformities, no ulcerations, no other skin breakdown bilaterally: Yes Sensation Testing Intact to touch and monofilament testing bilaterally:  Yes Pulse Check Posterior Tibialis and Dorsalis pulse intact bilaterally: Yes Comments     CMP Latest Ref Rng & Units 12/04/2018 11/29/2018 11/24/2018  Glucose 70 - 99 mg/dL - 265(H) 72  BUN 6 - 20 mg/dL - 15 19  Creatinine 0.44 - 1.00 mg/dL - 0.90 0.96    Sodium 135 - 145 mmol/L - 136 140  Potassium 3.5 - 5.1 mEq/L 3.5 3.8 3.3(L)  Chloride 98 - 111 mmol/L - 102 108  CO2 22 - 32 mmol/L - 25 26  Calcium 8.9 - 10.3 mg/dL - 8.7(L) 9.0  Total Protein 6.5 - 8.1 g/dL - - 7.9  Total Bilirubin 0.3 - 1.2 mg/dL - - 0.7  Alkaline Phos 38 - 126 U/L - - 98  AST 15 - 41 U/L - - 16  ALT 0 - 44 U/L - - 14   Lipid Panel     Component Value Date/Time   CHOL 131 09/20/2018 1113   TRIG 123 09/20/2018 1113   HDL 38 (L) 09/20/2018 1113   CHOLHDL 3.4 09/20/2018 1113   CHOLHDL 5 07/15/2015 1513   VLDL 44.0 (H) 07/15/2015 1513   LDLCALC 68 09/20/2018 1113   LDLDIRECT 93.0 07/15/2015 1513    CBC    Component Value Date/Time   WBC 13.7 (H) 11/29/2018 0345   RBC 4.55 11/29/2018 0345   HGB 12.9 11/29/2018 0345   HGB 13.4 06/15/2018 1056   HCT 39.4 11/29/2018 0345   HCT 40.3 06/15/2018 1056   PLT 221 11/29/2018 0345   PLT 229 06/15/2018 1056   MCV 86.6 11/29/2018 0345   MCV 88 06/15/2018 1056   MCH 28.4 11/29/2018 0345   MCHC 32.7 11/29/2018 0345   RDW 14.6 11/29/2018 0345   RDW 13.6 06/15/2018 1056   LYMPHSABS 1.0 12/30/2017 0812   MONOABS 1.3 (H) 12/30/2017 0812   EOSABS 0.1 12/30/2017 0812   BASOSABS 0.0 12/30/2017 0812    ASSESSMENT AND PLAN:  1. Type 2 diabetes mellitus with other circulatory complication, with long-term current use of insulin (HCC) Not at goal.  She will continue Lantus and Humalog.  Advised her to start the Victoza when she would have been due for her next Trulicity shot.  We have added Januvia encourage healthy eating habits.  Encouraged her to increase activity level but report any chest pains to me or her cardiologist - POCT glucose (manual entry) - POCT glycosylated hemoglobin (Hb A1C) - Microalbumin / creatinine urine ratio - sitaGLIPtin (JANUVIA) 25 MG tablet; Take 2 tablets (50 mg total) by mouth daily.  Dispense: 30 tablet; Refill: 6  2. Essential hypertension At goal.  Continue current medications and  low-salt diet  3. Coronary artery disease involving native coronary artery of native heart without angina pectoris Clinically stable.  Continue current medications including carvedilol, isosorbide, aspirin, statin  4. Severe obesity (BMI >= 40) (HCC) See #1 above  5. Moderate nonproliferative diabetic retinopathy of both eyes with macular edema associated with type 2 diabetes mellitus ([place]) Followed by ophthalmology  6. Influenza vaccination declined This was offered and patient declined  7. Encounter for screening mammogram for malignant neoplasm of breast - MM Digital Screening; Future    Patient was given the opportunity to ask questions.  Patient verbalized understanding of the plan and was able to repeat key elements of the plan.   Orders Placed This Encounter  Procedures  . MM Digital Screening  . Microalbumin / creatinine urine ratio  . POCT glucose (manual entry)  . POCT  glycosylated hemoglobin (Hb A1C)     Requested Prescriptions   Signed Prescriptions Disp Refills  . sitaGLIPtin (JANUVIA) 25 MG tablet 30 tablet 6    Sig: Take 2 tablets (50 mg total) by mouth daily.    Return in about 3 months (around 10/10/2019).  Karle Plumber, MD, FACP

## 2019-07-14 LAB — MICROALBUMIN / CREATININE URINE RATIO
Creatinine, Urine: 370 mg/dL
Microalb/Creat Ratio: 13 mg/g{creat} (ref 0–29)
Microalbumin, Urine: 47.8 ug/mL

## 2019-07-18 MED FILL — JANUVIA 25 MG TABLET: 25 | 30 days supply | Qty: 60 | Fill #0

## 2019-07-18 MED FILL — ATORVASTATIN CALCIUM 40 MG: 40 | 30 days supply | Qty: 30 | Fill #1

## 2019-07-24 NOTE — Progress Notes (Signed)
[facility] Note  07/25/2019     CHIEF COMPLAINT Patient presents for Retina Follow Up   HISTORY OF PRESENT ILLNESS: Kimberly Frazier is a 59 y.o. female who presents to the clinic today for:   HPI    Retina Follow Up    Patient presents with  Diabetic Retinopathy.  In both eyes.  This started 4 weeks ago.  Severity is moderate.  I, the attending physician,  performed the HPI with the patient and updated documentation appropriately.          Comments    Patient here for 4 weeks retina follow up for NPDR OU. Patient states missed last appointment. Vision doing ok. Has sharp eye pain.        Last edited by Bernarda Caffey, MD on 07/25/2019  4:07 PM. (History)    pt states she is delayed to follow up due to forgetting about a couple of appts   Referring physician: Ladell Pier, MD [place],  [place] [address]  HISTORICAL INFORMATION:   Selected notes from the MEDICAL RECORD NUMBER Referred by Dr. Rutherford Guys for concern of CME  LEE:  Ocular Hx- PMH-   CURRENT MEDICATIONS: Current Outpatient Medications (Ophthalmic Drugs)  Medication Sig  . Bromfenac Sodium (PROLENSA) 0.07 % SOLN Place 1 drop into both eyes 4 (four) times daily.   No current facility-administered medications for this visit. (Ophthalmic Drugs)   Current Outpatient Medications (Other)  Medication Sig  . acetaminophen (TYLENOL) 500 MG tablet Take 1,000 mg by mouth every 6 (six) hours as needed for moderate pain or headache.  Marland Kitchen amLODipine (NORVASC) 5 MG tablet Take 1 tablet (5 mg total) by mouth daily.  Marland Kitchen aspirin EC 81 MG tablet Take 1 tablet (81 mg total) daily by mouth.  Marland Kitchen atorvastatin (LIPITOR) 40 MG tablet TAKE 1 TABLET DAILY BY MOUTH.  . Blood Glucose Monitoring Suppl (TRUE METRIX METER) w/Device KIT Use as directed  . carvedilol (COREG) 25 MG tablet TAKE 1 TABLET (25 MG TOTAL) BY MOUTH 2 (TWO) TIMES DAILY. NEEDS TO MAKE APPOINTMENT.  Marland Kitchen gabapentin  (NEURONTIN) 300 MG capsule Take 1 capsule (300 mg total) by mouth at bedtime.  Marland Kitchen glucose blood (TRUE METRIX BLOOD GLUCOSE TEST) test strip Use as instructed  . insulin lispro (HUMALOG) 100 UNIT/ML injection Inject 0.28 mLs (28 Units total) into the skin 3 (three) times daily with meals. Must have office visit for refills  . isosorbide mononitrate (IMDUR) 60 MG 24 hr tablet Take 1 tablet (60 mg total) by mouth daily.  Marland Kitchen LANTUS SOLOSTAR 100 UNIT/ML Solostar Pen INJECT 55 UNITS INTO THE SKIN DAILY.  Marland Kitchen liraglutide (VICTOZA) 18 MG/3ML SOPN 0.6 mg subcut daily x 1 wk then 1.2 mg daily thereafter.  . nitroGLYCERIN (NITROSTAT) 0.4 MG SL tablet Place 1 tab under tongue for chest pain.  May repeat after 5 minutes x 2.  DO NOT TAKE MORE THAN 3 TABS DURING AN EPISODE OF CHEST PAIN (Patient taking differently: Place 0.4 mg under the tongue every 5 (five) minutes as needed for chest pain. DO NOT TAKE MORE THAN 3 TABS DURING AN EPISODE OF CHEST PAIN)  . nystatin (MYCOSTATIN/NYSTOP) powder Apply topically 2 (two) times daily. (Patient taking differently: Apply topically as needed. )  . potassium chloride (K-DUR) 10 MEQ tablet Take 2 tablets (20 mEq total) by mouth 2 (two) times daily.  . sitaGLIPtin (JANUVIA) 25 MG tablet Take 2 tablets (50 mg total) by mouth  daily.  . TRUEPLUS LANCETS 28G MISC Use as directted  . TRUEPLUS PEN NEEDLES 32G X 4 MM MISC USE AS DIRECTED WITH INSULIN   No current facility-administered medications for this visit. (Other)      REVIEW OF SYSTEMS: ROS    Positive for: Endocrine, Eyes   Negative for: Constitutional, Gastrointestinal, Neurological, Skin, Genitourinary, Musculoskeletal, HENT, Cardiovascular, Respiratory, Psychiatric, Allergic/Imm, Heme/Lymph   Last edited by Theodore Demark, COA on 07/25/2019  2:35 PM. (History)       ALLERGIES No Known Allergies  PAST MEDICAL HISTORY Past Medical History:  Diagnosis Date  . Adrenal adenoma, left   . Arthritis   . Cataract     Bilateral  . CKD (chronic kidney disease), stage II   . Coronary artery disease 07-28-2018 [facility]  currently due to no insurance   per cardiac cath 02-06-2015 (positive mild lateral ishcemia on stress test)--- dLAD 80%,  mLAD 40%,  mPDA 80% (small vessel),  ostial D1 70%,  CFx with lumial irregarlities-- medical management  . Depression   . Diabetic neuropathy ([place])   . History of Bell's palsy 07/2011   per pt residual facial pain on left side occasionally  . History of cancer of vagina 1999   per pt completed radiation and chemo  . History of sepsis 12/30/2017   positive blood culter fro E.coli  . Hyperlipidemia   . Hypertension   . Hypokalemia   . Insulin dependent type 2 diabetes mellitus, uncontrolled ([place])    followed by pcp---  A1c was 11.7 on 06-15-2018 in epic  . Myocardial infarction ([place])   . Nocturia   . Peripheral neuropathy   . PMB (postmenopausal bleeding)   . Wears dentures    upper  . Wears glasses    Past Surgical History:  Procedure Laterality Date  . BREAST BIOPSY  2012   benign  . CARDIAC CATHETERIZATION N/A 02/06/2015   Procedure: Left Heart Cath and Coronary Angiography;  Surgeon: Larey Dresser, MD;  Location: [facility];  Service: Cardiovascular;  Laterality: N/A;  . CATARACT EXTRACTION, BILATERAL    . HYSTEROSCOPY WITH D & C N/A 08/01/2018   Procedure: DILATATION AND CURETTAGE /HYSTEROSCOPY;  Surgeon: Mora Bellman, MD;  Location: [facility];  Service: Gynecology;  Laterality: N/A;  . ROBOTIC ASSISTED TOTAL HYSTERECTOMY WITH BILATERAL SALPINGO OOPHERECTOMY N/A 11/28/2018   Procedure: XI ROBOTIC ASSISTED TOTAL HYSTERECTOMY WITH BILATERAL SALPINGO OOPHORECTOMY;  Surgeon: Everitt Amber, MD;  Location: [facility];  Service: Gynecology;  Laterality: N/A;  . SENTINEL NODE BIOPSY N/A 11/28/2018   Procedure: SENTINEL NODE BIOPSY;  Surgeon: Everitt Amber, MD;  Location: [facility];  Service: Gynecology;  Laterality:  N/A;  . UMBILICAL HERNIA REPAIR  child    FAMILY HISTORY Family History  Problem Relation Age of Onset  . Heart disease Mother   . Hypertension Mother   . Diabetes Mother   . Cancer Father        hodgkins lymphoma  . Heart disease Sister        heart attack  . Diabetes Sister   . Cancer Other        parent  . Diabetes Other        parent  . Heart disease Other        parent  . Hyperlipidemia Other        parent  . Hypertension Other        parent  . Arthritis Other  parent  . Diabetes Maternal Aunt   . Diabetes Maternal Uncle   . Breast cancer Neg Hx     SOCIAL HISTORY Social History   Tobacco Use  . Smoking status: Never Smoker  . Smokeless tobacco: Never Used  Substance Use Topics  . Alcohol use: Not Currently  . Drug use: Not Currently    Types: Marijuana    Comment: 07-28-2018  per pt last smoked 11/21/2018         OPHTHALMIC EXAM:  Base Eye Exam    Visual Acuity (Snellen - Linear)      Right Left   Dist [place] 20/50 20/800   Dist ph [place] 20/40 -2 NI       Tonometry (Tonopen, 2:32 PM)      Right Left   Pressure 22 17       Pupils      Dark Light Shape React APD   Right 3 2 Round Brisk None   Left 3 2 Round Brisk None       Visual Fields (Counting fingers)      Left Right    Full Full       Extraocular Movement      Right Left    Full, Ortho Full, Ortho       Neuro/Psych    Oriented x3: Yes   Mood/Affect: Normal       Dilation    Both eyes: 1.0% Mydriacyl, 2.5% Phenylephrine @ 2:32 PM        Slit Lamp and Fundus Exam    Slit Lamp Exam      Right Left   Lids/Lashes Dermatochalasis - upper lid Dermatochalasis - upper lid   Conjunctiva/Sclera Mild Melanosis Mild Melanosis   Cornea 1+ Punctate epithelial erosions 1+ Punctate epithelial erosions, well healed temporal cataract wounds   Anterior Chamber Deep, 0.5+cell/pigment Deep, 0.5+cell/pigment   Iris Round and dilated, No NVI Round and dilated, No NVI   Lens PC IOL in  good position with trace PCO PC IOL in good position with trace PCO   Vitreous Vitreous syneresis Vitreous syneresis       Fundus Exam      Right Left   Disc Pink and Sharp Compact, Pink and Sharp, mild hyperemia   C/D Ratio 0.2 0.1   Macula Blunted foveal reflex, +central edema - slightly increased, +Microaneurysms greatest inferiorly and nasally, focal fibrosis inferior macula Blunted foveal reflex, central edema - slightly increased , +edudate/MA/IRH -- slightly increased   Vessels Vascular attenuation, Tortuous Vascular attenuation, Tortuous, +venous beading, AV crossing changes   Periphery Attached, scattered drusen, scattered IRH Attached, scattered DBH greatest posteriorly           Refraction    Wearing Rx      Sphere   Right OTC   Left OTC          IMAGING AND PROCEDURES  Imaging and Procedures for _0 @  OCT, Retina - OU - Both Eyes       Right Eye Quality was good. Central Foveal Thickness: 343. Progression has worsened. Findings include abnormal foveal contour, intraretinal fluid, no SRF, intraretinal hyper-reflective material, vitreomacular adhesion  (Mild interval increase in IRF).   Left Eye Quality was good. Central Foveal Thickness: 478. Progression has worsened. Findings include abnormal foveal contour, intraretinal fluid, subretinal fluid, intraretinal hyper-reflective material, vitreomacular adhesion  (Mild interval increase in IRf/IRHM).   Notes *Images captured and stored on drive  Diagnosis / Impression:  Macular edema OU - likely  combination of CME + DME OU - interval increase in IRF OU  Clinical management:  See below  Abbreviations: NFP - Normal foveal profile. CME - cystoid macular edema. PED - pigment epithelial detachment. IRF - intraretinal fluid. SRF - subretinal fluid. EZ - ellipsoid zone. ERM - epiretinal membrane. ORA - outer retinal atrophy. ORT - outer retinal tubulation. SRHM - subretinal hyper-reflective material         Intravitreal Injection, Pharmacologic Agent - OD - Right Eye       Time Out 07/25/2019. 2:31 PM. Confirmed correct patient, procedure, site, and patient consented.   Anesthesia Topical anesthesia was used. Anesthetic medications included Lidocaine 2%, Proparacaine 0.5%.   Procedure Preparation included 5% betadine to ocular surface, eyelid speculum. A supplied needle was used.   Injection:  1.25 mg Bevacizumab (AVASTIN) SOLN   NDC: 16109-604-54, Lot: [id number] , Expiration date: 08/15/2019   Route: Intravitreal, Site: Right Eye, Waste: 0 mL  Post-op Post injection exam found visual acuity of at least counting fingers. The patient tolerated the procedure well. There were no complications. The patient received written and verbal post procedure care education.        Intravitreal Injection, Pharmacologic Agent - OS - Left Eye       Time Out 07/25/2019. 2:32 PM. Confirmed correct patient, procedure, site, and patient consented.   Anesthesia Topical anesthesia was used. Anesthetic medications included Lidocaine 2%, Proparacaine 0.5%.   Procedure Preparation included 5% betadine to ocular surface, eyelid speculum. A supplied needle was used.   Injection:  1.25 mg Bevacizumab (AVASTIN) SOLN   NDC: 09811-914-78, Lot: [id number] , Expiration date: 08/15/2019   Route: Intravitreal, Site: Left Eye, Waste: 0 mg  Post-op Post injection exam found visual acuity of at least counting fingers. The patient tolerated the procedure well. There were no complications. The patient received written and verbal post procedure care education.                 ASSESSMENT/PLAN:    ICD-10-CM   1. Moderate nonproliferative diabetic retinopathy of both eyes with macular edema associated with type 2 diabetes mellitus (HCC)  G95.6213 Intravitreal Injection, Pharmacologic Agent - OD - Right Eye    Intravitreal Injection, Pharmacologic Agent - OS - Left Eye    Bevacizumab (AVASTIN) SOLN 1.25 mg     Bevacizumab (AVASTIN) SOLN 1.25 mg  2. Retinal edema  H35.81 OCT, Retina - OU - Both Eyes  3. CME (cystoid macular edema), bilateral  H35.353   4. Essential hypertension  I10   5. Hypertensive retinopathy of both eyes  H35.033   6. Pseudophakia of both eyes  Z96.1     1-3. Moderate Non-proliferative diabetic retinopathy, OU OD with combination DME/CME  - delayed f/u from 4 weeks to 9 weeks  - exam shows scattered IRH, edema and tortuous blood vessels OU  - FA (10.23.20) shows leaking MA OU and paracentral petaloid hyperfluorecence OD --> CME/[place]  - OCT shows diabetic macular edema, OU; OD with CME/[place] component contributing as well  - s/p IVA OS #1 (10.23.20), #2 (11.18.20), #3 (12.18.20)  - s/p IVA OD #1 (11.06.20), #2 (12.18.20)  - BCVA decreased OU: OD 20/40 from 20/30; OS 20/800 from 20/300  - OCT today shows interval increase in macular edema OU  - recommend IVA OU (OD #3 and OS #4) today, 02.24.20  - pt wishes to proceed  - RBA of procedure discussed, questions answered  - informed consent obtained  - Avastin informed consent form signed and scanned  on 12.18.2020  - see procedure note  - cont Pred Forte and Ketorolac QID OU  - f/u in 4 weeks, DFE, OCT, possible injection OU  4,5. Hypertensive retinopathy OU  - discussed importance of tight BP control  - monitor  6. Pseudophakia OU  - s/p CE/IOL OU Gershon Crane)   OD: 03/07/2019   OS: 02/28/2019  - IOLs in good position  - CME OD as above  - monitor   Ophthalmic Meds Ordered this visit:  Meds ordered this encounter  Medications  . Bevacizumab (AVASTIN) SOLN 1.25 mg  . Bevacizumab (AVASTIN) SOLN 1.25 mg       Return in about 4 weeks (around 08/22/2019) for f/u NPDR OU, DFE, OCT.  There are no Patient Instructions on file for this visit.   Explained the diagnoses, plan, and follow up with the patient and they expressed understanding.  Patient expressed understanding of the importance of proper  follow up care.   This document serves as a record of services personally performed by Gardiner Sleeper, MD, PhD. It was created on their behalf by Roselee Nova, COMT. The creation of this record is the provider's dictation and/or activities during the visit.  Electronically signed by: Roselee Nova, COMT 07/25/19 10:21 PM   This document serves as a record of services personally performed by Gardiner Sleeper, MD, PhD. It was created on their behalf by Ernest Mallick, OA, an ophthalmic assistant. The creation of this record is the provider's dictation and/or activities during the visit.    Electronically signed by: Ernest Mallick, OA 02.24.2021 10:21 PM  Gardiner Sleeper, M.D., Ph.D. Diseases & Surgery of the Retina and [place] 07/25/2019   I have reviewed the above documentation for accuracy and completeness, and I agree with the above. Gardiner Sleeper, M.D., Ph.D. 07/25/19 10:21 PM   Abbreviations: M myopia (nearsighted); A astigmatism; H hyperopia (farsighted); P presbyopia; Mrx spectacle prescription;  CTL contact lenses; OD right eye; OS left eye; OU both eyes  XT exotropia; ET esotropia; PEK punctate epithelial keratitis; PEE punctate epithelial erosions; DES dry eye syndrome; MGD meibomian gland dysfunction; ATs artificial tears; PFAT's preservative free artificial tears; Raymond nuclear sclerotic cataract; PSC posterior subcapsular cataract; ERM epi-retinal membrane; PVD posterior vitreous detachment; RD retinal detachment; DM diabetes mellitus; DR diabetic retinopathy; NPDR non-proliferative diabetic retinopathy; PDR proliferative diabetic retinopathy; CSME clinically significant macular edema; DME diabetic macular edema; dbh dot blot hemorrhages; CWS cotton wool spot; POAG primary open angle glaucoma; C/D cup-to-disc ratio; HVF humphrey visual field; GVF goldmann visual field; OCT optical coherence tomography; IOP intraocular pressure; BRVO Branch retinal vein  occlusion; CRVO central retinal vein occlusion; CRAO central retinal artery occlusion; BRAO branch retinal artery occlusion; RT retinal tear; SB scleral buckle; PPV pars plana vitrectomy; VH Vitreous hemorrhage; PRP panretinal laser photocoagulation; IVK intravitreal kenalog; VMT vitreomacular traction; MH Macular hole;  NVD neovascularization of the disc; NVE neovascularization elsewhere; AREDS age related eye disease study; ARMD age related macular degeneration; POAG primary open angle glaucoma; EBMD epithelial/anterior basement membrane dystrophy; ACIOL anterior chamber intraocular lens; IOL intraocular lens; PCIOL posterior chamber intraocular lens; Phaco/IOL phacoemulsification with intraocular lens placement; [place] photorefractive keratectomy; LASIK laser assisted in situ keratomileusis; HTN hypertension; DM diabetes mellitus; COPD chronic obstructive pulmonary disease

## 2019-07-25 ENCOUNTER — Encounter (INDEPENDENT_AMBULATORY_CARE_PROVIDER_SITE_OTHER): Payer: Self-pay | Admitting: Ophthalmology

## 2019-07-25 ENCOUNTER — Ambulatory Visit (INDEPENDENT_AMBULATORY_CARE_PROVIDER_SITE_OTHER): Payer: Medicaid Other | Admitting: Ophthalmology

## 2019-07-25 ENCOUNTER — Other Ambulatory Visit: Payer: Self-pay

## 2019-07-25 DIAGNOSIS — I10 Essential (primary) hypertension: Secondary | ICD-10-CM | POA: Diagnosis not present

## 2019-07-25 DIAGNOSIS — E113313 Type 2 diabetes mellitus with moderate nonproliferative diabetic retinopathy with macular edema, bilateral: Secondary | ICD-10-CM | POA: Diagnosis not present

## 2019-07-25 DIAGNOSIS — H3581 Retinal edema: Secondary | ICD-10-CM | POA: Diagnosis not present

## 2019-07-25 DIAGNOSIS — Z961 Presence of intraocular lens: Secondary | ICD-10-CM

## 2019-07-25 DIAGNOSIS — H35033 Hypertensive retinopathy, bilateral: Secondary | ICD-10-CM

## 2019-07-25 DIAGNOSIS — H35353 Cystoid macular degeneration, bilateral: Secondary | ICD-10-CM

## 2019-07-25 MED ORDER — BEVACIZUMAB CHEMO INJECTION 1.25MG/0.05ML SYRINGE FOR KALEIDOSCOPE
1.2500 mg | INTRAVITREAL | Status: AC | PRN
  Administered 2019-07-25: 22:00:00 1.25 mg via INTRAVITREAL

## 2019-08-12 NOTE — Progress Notes (Signed)
Virtual Visit via Telephone Note   This visit type was conducted due to national recommendations for restrictions regarding the COVID-19 Pandemic (e.g. social distancing) in an effort to limit this patient's exposure and mitigate transmission in our community.  Due to her co-morbid illnesses, this patient is at least at moderate risk for complications without adequate follow up.  This format is felt to be most appropriate for this patient at this time.  The patient did not have access to video technology/had technical difficulties with video requiring transitioning to audio format only (telephone).  All issues noted in this document were discussed and addressed.  No physical exam could be performed with this format.  Please refer to the patient's chart for her  consent to telehealth for [facility].   Date:  08/13/2019   ID:  [name], DOB 1960-11-20, MRN [id number]  Patient Location: Home Provider Location: Home  PCP:  Ladell Pier, MD  Cardiologist:  Buford Dresser, MD  Electrophysiologist:  None   Evaluation Performed:  New Patient Evaluation  Chief Complaint:  Shortness of breath.  History of Present Illness:    Reniya Mcclees is a 59 y.o. female we are following for ongoing assessment and management of CAD with most recent cardiac cath  In 2016 with distal LAD, 80%, mLAD 40%, mPDA 80%; (small vessel), ostial D1 70%, CFx with luminal irregularities, treated medically, now with CTO of LAD at mid-LAD 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HL, HTN. Other hx includes Type II diabetes, depression, diabetic neuropathy.  Was last seen by Dr. Harrell Gave on 02/15/2019 virtually. She was stable from a CV standpoint, and was continued on current regimen.   Complains of low energy over the last 3 months. She is also reporting more depression lately with greater intensity. Cannot to yard work because of chest pressure. Has been having higher BP even though she taking her  medications.   She states she is not normally very active, and has now just begun to do some yard work with the weather getting warmer. She wants to know if she needs another cath. The chest pain is not severe, but noticeable. Does not occur while at rest. She has gained about 10 lbs since being seen last.   The patient does not have symptoms concerning for COVID-19 infection (fever, chills, cough, or new shortness of breath).    Past Medical History:  Diagnosis Date  . Adrenal adenoma, left   . Arthritis   . Cataract    Bilateral  . CKD (chronic kidney disease), stage II   . Coronary artery disease 07-28-2018 followed [facility])  currently due to no insurance   per cardiac cath 02-06-2015 (positive mild lateral ishcemia on stress test)--- dLAD 80%,  mLAD 40%,  mPDA 80% (small vessel),  ostial D1 70%,  CFx with lumial irregarlities-- medical management  . Depression   . Diabetic neuropathy ([place])   . History of Bell's palsy 07/2011   per pt residual facial pain on left side occasionally  . History of cancer of vagina 1999   per pt completed radiation and chemo  . History of sepsis 12/30/2017   positive blood culter fro E.coli  . Hyperlipidemia   . Hypertension   . Hypokalemia   . Insulin dependent type 2 diabetes mellitus, uncontrolled ([place])    followed by pcp---  A1c was 11.7 on 06-15-2018 in epic  . Myocardial infarction ([place])   . Nocturia   . Peripheral neuropathy   .  PMB (postmenopausal bleeding)   . Wears dentures    upper  . Wears glasses    Past Surgical History:  Procedure Laterality Date  . BREAST BIOPSY  2012   benign  . CARDIAC CATHETERIZATION N/A 02/06/2015   Procedure: Left Heart Cath and Coronary Angiography;  Surgeon: Larey Dresser, MD;  Location: [facility];  Service: Cardiovascular;  Laterality: N/A;  . CATARACT EXTRACTION, BILATERAL    . HYSTEROSCOPY WITH D & C N/A 08/01/2018   Procedure: DILATATION AND CURETTAGE /HYSTEROSCOPY;   Surgeon: Mora Bellman, MD;  Location: [facility];  Service: Gynecology;  Laterality: N/A;  . ROBOTIC ASSISTED TOTAL HYSTERECTOMY WITH BILATERAL SALPINGO OOPHERECTOMY N/A 11/28/2018   Procedure: XI ROBOTIC ASSISTED TOTAL HYSTERECTOMY WITH BILATERAL SALPINGO OOPHORECTOMY;  Surgeon: Everitt Amber, MD;  Location: [facility];  Service: Gynecology;  Laterality: N/A;  . SENTINEL NODE BIOPSY N/A 11/28/2018   Procedure: SENTINEL NODE BIOPSY;  Surgeon: Everitt Amber, MD;  Location: [facility];  Service: Gynecology;  Laterality: N/A;  . UMBILICAL HERNIA REPAIR  child     Current Meds  Medication Sig  . acetaminophen (TYLENOL) 500 MG tablet Take 1,000 mg by mouth every 6 (six) hours as needed for moderate pain or headache.  Marland Kitchen amLODipine (NORVASC) 5 MG tablet Take 1 tablet (5 mg total) by mouth daily.  Marland Kitchen aspirin EC 81 MG tablet Take 1 tablet (81 mg total) daily by mouth.  Marland Kitchen atorvastatin (LIPITOR) 40 MG tablet TAKE 1 TABLET DAILY BY MOUTH.  . Blood Glucose Monitoring Suppl (TRUE METRIX METER) w/Device KIT Use as directed  . Bromfenac Sodium (PROLENSA) 0.07 % SOLN Place 1 drop into both eyes 4 (four) times daily.  . carvedilol (COREG) 25 MG tablet TAKE 1 TABLET (25 MG TOTAL) BY MOUTH 2 (TWO) TIMES DAILY. NEEDS TO MAKE APPOINTMENT.  Marland Kitchen gabapentin (NEURONTIN) 300 MG capsule Take 1 capsule (300 mg total) by mouth at bedtime.  Marland Kitchen glucose blood (TRUE METRIX BLOOD GLUCOSE TEST) test strip Use as instructed  . insulin lispro (HUMALOG) 100 UNIT/ML injection Inject 0.28 mLs (28 Units total) into the skin 3 (three) times daily with meals. Must have office visit for refills  . isosorbide mononitrate (IMDUR) 60 MG 24 hr tablet Take 1 tablet (60 mg total) by mouth daily.  Marland Kitchen LANTUS SOLOSTAR 100 UNIT/ML Solostar Pen INJECT 55 UNITS INTO THE SKIN DAILY.  . nitroGLYCERIN (NITROSTAT) 0.4 MG SL tablet Place 1 tab under tongue for chest pain.  May repeat after 5 minutes x 2.  DO NOT TAKE MORE THAN 3 TABS DURING AN EPISODE OF  CHEST PAIN (Patient taking differently: Place 0.4 mg under the tongue every 5 (five) minutes as needed for chest pain. DO NOT TAKE MORE THAN 3 TABS DURING AN EPISODE OF CHEST PAIN)  . nystatin (MYCOSTATIN/NYSTOP) powder Apply topically 2 (two) times daily. (Patient taking differently: Apply topically as needed. )  . potassium chloride (K-DUR) 10 MEQ tablet Take 2 tablets (20 mEq total) by mouth 2 (two) times daily.  . TRUEPLUS LANCETS 28G MISC Use as directted  . TRUEPLUS PEN NEEDLES 32G X 4 MM MISC USE AS DIRECTED WITH INSULIN     Allergies:   Patient has no known allergies.   Social History   Tobacco Use  . Smoking status: Never Smoker  . Smokeless tobacco: Never Used  Substance Use Topics  . Alcohol use: Not Currently  . Drug use: Not Currently    Types: Marijuana    Comment: 07-28-2018  per pt last smoked 11/21/2018     Family Hx: The patient's family history includes Arthritis in an other family member; Cancer in her father and another family member; Diabetes in her maternal aunt, maternal uncle, mother, sister, and another family member; Heart disease in her mother, sister, and another family member; Hyperlipidemia in an other family member; Hypertension in her mother and another family member. There is no history of Breast cancer.  ROS:   Please see the history of present illness.    All other systems reviewed and are negative.   Prior CV studies:   The following studies were reviewed today: Echocardiogram 09/06/2018  1. Moderate akinesis of the left ventricular, entire apical segment.  2. The left ventricle has normal systolic function, with an ejection  fraction of 55-60%. The cavity size was normal. There is severe concentric  left ventricular hypertrophy. Left ventricular diastolic Doppler  parameters are indeterminate.  3. LV thrombus excluded by echo contrast.  4. The right ventricle has normal systolic function. The cavity was  normal. There is no increase in  right ventricular wall thickness. Right  ventricular systolic pressure could not be assessed.  5. Large pericardial effusion.  6. The pericardial effusion is circumferential.  7. Large pericardial effusion, largest area anterior to RV with what  appears to be adherent material (question clot vs. fibrin vs. mass) at the  RV apex.  8. The aortic valve is tricuspid. Mild calcification of the aortic valve.  9. The aortic root is normal in size and structure.  10. 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   Labs/Other Tests and Data Reviewed:    EKG:  No ECG reviewed.  Recent Labs: 11/24/2018: ALT 14 11/29/2018: BUN 15; Creatinine, Ser 0.90; Hemoglobin 12.9; Platelets 221; Sodium 136 12/04/2018: Potassium 3.5   Recent Lipid Panel Lab Results  Component Value Date/Time   CHOL 131 09/20/2018 11:13 AM   TRIG 123 09/20/2018 11:13 AM   HDL 38 (L) 09/20/2018 11:13 AM   CHOLHDL 3.4 09/20/2018 11:13 AM   CHOLHDL 5 07/15/2015 03:13 PM   LDLCALC 68 09/20/2018 11:13 AM   LDLDIRECT 93.0 07/15/2015 03:13 PM    Wt Readings from Last 3 Encounters:  08/13/19 272 lb (123.4 kg)  07/13/19 272 lb (123.4 kg)  06/19/19 268 lb 2 oz (121.6 kg)     Objective:    Vital Signs:  BP (!) 170/111   Pulse 85   Ht _0  (1.626 m)   Wt 272 lb (123.4 kg)   BMI 46.69 kg/m   (She has not taken her medications today).   VITAL SIGNS:  reviewed  Limited examination due to phone call.   ASSESSMENT & PLAN:    1. CAD; Complaints of worsening DOE, fatigue and some chest pressure with walking or working in the yard. Most recent cardiac cath in 2016 with multivessel CAD treated medically, with CTO LAD at the mid LAD.   She will be scheduled for a cardiac CTA with FFR, for further evaluation and need for repeat cardiac cath. Due to body habitus, she would not be a candidate for NM study. I have explained the procedure to her and let her know she will need COVID test with isolation until the  CT is completed.  She verbalizes understanding.   For now continue current medical regimen of statin, nitrates and BP control . She is to call us if symptoms get worse at which time we may need to proceed  with cardiac cath instead.   2. Hypertension: Normally well controlled. The reading today is without taking her medications yet. When she saw her PCP last month, BP 121/80.  I will not change any of her medications at this time. I have advised hr to take her medications, wait two hours before taking her BP again. Make sure the machine has a new battery, be seated for 5 minutes with both feet on the floor before taking reading. She verbalizes understanding. She is also to avoid salty foods.   3. Hyperlipidemia: Will continue atorvastatin with goal of LDL < 70. She will have fasting lipids and LFT's completed along with labs for her CTA.   4. Morbid Obesity: Dyspnea may be also related to this, but not exclusively. May need to consider OSA study, if Cardiac CTA is not indicative of progressive CAD.     COVID-19 Education: The signs and symptoms of COVID-19 were discussed with the patient and how to seek care for testing (follow up with PCP or arrange E-visit).  The importance of social distancing was discussed today. She has not yet had her COVID vaccine.   Time:   Today, I have spent 84mnutes with the patient with telehealth technology discussing the above problems.     Medication Adjustments/Labs and Tests Ordered: Current medicines are reviewed at length with the patient today.  Concerns regarding medicines are outlined above.   Tests Ordered: No orders of the defined types were placed in this encounter.   Medication Changes: No orders of the defined types were placed in this encounter.   Disposition:  Follow up one month with Dr.Christopher.   Signed, KPhill Myron LWest Pugh ANP, AACC  08/13/2019 9:59 AM    [facility]

## 2019-08-13 ENCOUNTER — Encounter: Payer: Self-pay | Admitting: Adult Health

## 2019-08-13 ENCOUNTER — Telehealth (INDEPENDENT_AMBULATORY_CARE_PROVIDER_SITE_OTHER): Payer: Medicaid Other | Admitting: Adult Health

## 2019-08-13 VITALS — BP 170/111 | HR 85 | Ht 64.0 in | Wt 272.0 lb

## 2019-08-13 DIAGNOSIS — I25118 Atherosclerotic heart disease of native coronary artery with other forms of angina pectoris: Secondary | ICD-10-CM | POA: Diagnosis not present

## 2019-08-13 DIAGNOSIS — E1165 Type 2 diabetes mellitus with hyperglycemia: Secondary | ICD-10-CM

## 2019-08-13 DIAGNOSIS — IMO0002 Reserved for concepts with insufficient information to code with codable children: Secondary | ICD-10-CM

## 2019-08-13 DIAGNOSIS — R072 Precordial pain: Secondary | ICD-10-CM

## 2019-08-13 DIAGNOSIS — R0602 Shortness of breath: Secondary | ICD-10-CM

## 2019-08-13 DIAGNOSIS — E782 Mixed hyperlipidemia: Secondary | ICD-10-CM

## 2019-08-13 NOTE — Patient Instructions (Addendum)
Medication Instructions:  Continue current medications  *If you need a refill on your cardiac medications before your next appointment, please call your pharmacy*   Lab Work: BMP, Fasting Lipid and Liver If you have labs (blood work) drawn today and your tests are completely normal, you will receive your results only by: Marland Kitchen MyChart Message (if you have MyChart) OR . A paper copy in the mail If you have any lab test that is abnormal or we need to change your treatment, we will call you to review the results.   Testing/Procedures: Non-Cardiac CT Angiography (CTA), is a special type of CT scan that uses a computer to produce multi-dimensional views of major blood vessels throughout the body. In CT angiography, a contrast material is injected through an IV to help visualize the blood vessels    Follow-Up: At [facility], you and your health needs are our priority.  As part of our continuing mission to provide you with exceptional heart care, we have created designated Provider Care Teams.  These Care Teams include your primary Cardiologist (physician) and Advanced Practice Providers (APPs -  Physician Assistants and Nurse Practitioners) who all work together to provide you with the care you need, when you need it.  We recommend signing up for the patient portal called "MyChart".  Sign up information is provided on this After Visit Summary.  MyChart is used to connect with patients for Virtual Visits (Telemedicine).  Patients are able to view lab/test results, encounter notes, upcoming appointments, etc.  Non-urgent messages can be sent to your provider as well.   To learn more about what you can do with MyChart, go to [email or web address].    Your next appointment:   1 month(s)  The format for your next appointment:   In Person  Provider:   Buford Dresser, MD   Other Instructions Your cardiac CT will be scheduled at one of the below locations:   [facility] [address] [place], [place] [address] [phone]  [place] [address] [place], [place] [address] [phone]  If scheduled at [facility], please arrive at the [facility] main entrance of [facility] 30 minutes prior to test start time. Proceed to the [facility] Radiology Department (first floor) to check-in and test prep.  If scheduled at [facility], please arrive 15 mins early for check-in and test prep.  Please follow these instructions carefully (unless otherwise directed):   On the Night Before the Test: . Be sure to Drink plenty of water. . Do not consume any caffeinated/decaffeinated beverages or chocolate 12 hours prior to your test. . Do not take any antihistamines 12 hours prior to your test.   On the Day of the Test: . Drink plenty of water. Do not drink any water within one hour of the test. . Do not eat any food 4 hours prior to the test. . You may take your regular medications prior to the test.  . Take Carvedilol two hours prior to test.       FEMALES- please wear underwire-free bra if available  After the Test: . Drink plenty of water. . After receiving IV contrast, you may experience a mild flushed feeling. This is normal. . On occasion, you may experience a mild rash up to 24 hours after the test. This is not dangerous. If this occurs, you can take Benadryl 25 mg and increase your fluid intake. Marland Kitchen  If you experience trouble breathing, this can be serious. If it is severe call 911 IMMEDIATELY. If it is mild, please call our office. . If you take any of these medications: Glipizide/Metformin, Avandament, Glucavance, please do not take 48 hours after completing test unless otherwise instructed.   Once we have confirmed authorization from your insurance company, we will call you to set up a date and time for your test.   For non-scheduling related  questions, please contact the cardiac imaging nurse navigator should you have any questions/concerns: Marchia Bond, RN Navigator Cardiac Imaging [facility] [phone] office  For scheduling needs, including cancellations and rescheduling, please call [phone].

## 2019-08-15 LAB — ALDOSTERONE + RENIN ACTIVITY W/O RATIO: Renin: <0.167 ng/mL/hr — ABNORMAL LOW (ref 0.167–5.380)

## 2019-08-21 NOTE — Progress Notes (Signed)
[facility] Note  08/22/2019     CHIEF COMPLAINT Patient presents for Retina Follow Up   HISTORY OF PRESENT ILLNESS: Kimberly Frazier is a 59 y.o. female who presents to the clinic today for:   HPI    Retina Follow Up    Patient presents with  Diabetic Retinopathy.  In both eyes.  This started 4 weeks ago.  Severity is moderate.  I, the attending physician,  performed the HPI with the patient and updated documentation appropriately.          Comments    Patient here for 4 weeks retina follow up for NPDR OU. Patient states vision doing ok. Comes in and out. On occasion has eye pain.        Last edited by Bernarda Caffey, MD on 08/22/2019  2:33 PM. (History)    pt states she does not feel like her vision has improved, especially her left eye, she is still using PF and ketorolac qid OU   Referring physician: Ladell Pier, MD [place],  [place] [address]  HISTORICAL INFORMATION:   Selected notes from the MEDICAL RECORD NUMBER Referred by Dr. Rutherford Guys for concern of CME  LEE:  Ocular Hx- PMH-   CURRENT MEDICATIONS: Current Outpatient Medications (Ophthalmic Drugs)  Medication Sig  . Bromfenac Sodium (PROLENSA) 0.07 % SOLN Place 1 drop into both eyes 4 (four) times daily.   No current facility-administered medications for this visit. (Ophthalmic Drugs)   Current Outpatient Medications (Other)  Medication Sig  . acetaminophen (TYLENOL) 500 MG tablet Take 1,000 mg by mouth every 6 (six) hours as needed for moderate pain or headache.  Marland Kitchen amLODipine (NORVASC) 5 MG tablet Take 1 tablet (5 mg total) by mouth daily.  Marland Kitchen aspirin EC 81 MG tablet Take 1 tablet (81 mg total) daily by mouth.  Marland Kitchen atorvastatin (LIPITOR) 40 MG tablet TAKE 1 TABLET DAILY BY MOUTH.  . Blood Glucose Monitoring Suppl (TRUE METRIX METER) w/Device KIT Use as directed  . carvedilol (COREG) 25 MG tablet TAKE 1 TABLET (25 MG TOTAL) BY MOUTH 2 (TWO) TIMES DAILY. NEEDS  TO MAKE APPOINTMENT.  Marland Kitchen gabapentin (NEURONTIN) 300 MG capsule Take 1 capsule (300 mg total) by mouth at bedtime.  Marland Kitchen glucose blood (TRUE METRIX BLOOD GLUCOSE TEST) test strip Use as instructed  . insulin lispro (HUMALOG) 100 UNIT/ML injection Inject 0.28 mLs (28 Units total) into the skin 3 (three) times daily with meals. Must have office visit for refills  . isosorbide mononitrate (IMDUR) 60 MG 24 hr tablet Take 1 tablet (60 mg total) by mouth daily.  Marland Kitchen LANTUS SOLOSTAR 100 UNIT/ML Solostar Pen INJECT 55 UNITS INTO THE SKIN DAILY.  Marland Kitchen liraglutide (VICTOZA) 18 MG/3ML SOPN 0.6 mg subcut daily x 1 wk then 1.2 mg daily thereafter. (Patient not taking: Reported on 08/13/2019)  . nitroGLYCERIN (NITROSTAT) 0.4 MG SL tablet Place 1 tab under tongue for chest pain.  May repeat after 5 minutes x 2.  DO NOT TAKE MORE THAN 3 TABS DURING AN EPISODE OF CHEST PAIN (Patient taking differently: Place 0.4 mg under the tongue every 5 (five) minutes as needed for chest pain. DO NOT TAKE MORE THAN 3 TABS DURING AN EPISODE OF CHEST PAIN)  . nystatin (MYCOSTATIN/NYSTOP) powder Apply topically 2 (two) times daily. (Patient taking differently: Apply topically as needed. )  . potassium chloride (K-DUR) 10 MEQ tablet Take 2 tablets (20 mEq total) by mouth 2 (two) times  daily.  . sitaGLIPtin (JANUVIA) 25 MG tablet Take 2 tablets (50 mg total) by mouth daily. (Patient not taking: Reported on 08/13/2019)  . TRUEPLUS LANCETS 28G MISC Use as directted  . TRUEPLUS PEN NEEDLES 32G X 4 MM MISC USE AS DIRECTED WITH INSULIN   No current facility-administered medications for this visit. (Other)      REVIEW OF SYSTEMS: ROS    Positive for: Endocrine, Eyes   Negative for: Constitutional, Gastrointestinal, Neurological, Skin, Genitourinary, Musculoskeletal, HENT, Cardiovascular, Respiratory, Psychiatric, Allergic/Imm, Heme/Lymph   Last edited by Theodore Demark, COA on 08/22/2019  2:18 PM. (History)       ALLERGIES No Known  Allergies  PAST MEDICAL HISTORY Past Medical History:  Diagnosis Date  . Adrenal adenoma, left   . Arthritis   . Cataract    Bilateral  . CKD (chronic kidney disease), stage II   . Coronary artery disease 07-28-2018 followed [facility]  currently due to no insurance   per cardiac cath 02-06-2015 (positive mild lateral ishcemia on stress test)--- dLAD 80%,  mLAD 40%,  mPDA 80% (small vessel),  ostial D1 70%,  CFx with lumial irregarlities-- medical management  . Depression   . Diabetic neuropathy ([place])   . History of Bell's palsy 07/2011   per pt residual facial pain on left side occasionally  . History of cancer of vagina 1999   per pt completed radiation and chemo  . History of sepsis 12/30/2017   positive blood culter fro E.coli  . Hyperlipidemia   . Hypertension   . Hypokalemia   . Insulin dependent type 2 diabetes mellitus, uncontrolled ([place])    followed by pcp---  A1c was 11.7 on 06-15-2018 in epic  . Myocardial infarction ([place])   . Nocturia   . Peripheral neuropathy   . PMB (postmenopausal bleeding)   . Wears dentures    upper  . Wears glasses    Past Surgical History:  Procedure Laterality Date  . BREAST BIOPSY  2012   benign  . CARDIAC CATHETERIZATION N/A 02/06/2015   Procedure: Left Heart Cath and Coronary Angiography;  Surgeon: Larey Dresser, MD;  Location: [facility];  Service: Cardiovascular;  Laterality: N/A;  . CATARACT EXTRACTION, BILATERAL    . HYSTEROSCOPY WITH D & C N/A 08/01/2018   Procedure: DILATATION AND CURETTAGE /HYSTEROSCOPY;  Surgeon: Mora Bellman, MD;  Location: [facility];  Service: Gynecology;  Laterality: N/A;  . ROBOTIC ASSISTED TOTAL HYSTERECTOMY WITH BILATERAL SALPINGO OOPHERECTOMY N/A 11/28/2018   Procedure: XI ROBOTIC ASSISTED TOTAL HYSTERECTOMY WITH BILATERAL SALPINGO OOPHORECTOMY;  Surgeon: Everitt Amber, MD;  Location: [facility];  Service: Gynecology;  Laterality: N/A;  . SENTINEL NODE BIOPSY N/A  11/28/2018   Procedure: SENTINEL NODE BIOPSY;  Surgeon: Everitt Amber, MD;  Location: [facility];  Service: Gynecology;  Laterality: N/A;  . UMBILICAL HERNIA REPAIR  child    FAMILY HISTORY Family History  Problem Relation Age of Onset  . Heart disease Mother   . Hypertension Mother   . Diabetes Mother   . Cancer Father        hodgkins lymphoma  . Heart disease Sister        heart attack  . Diabetes Sister   . Cancer Other        parent  . Diabetes Other        parent  . Heart disease Other        parent  . Hyperlipidemia Other  parent  . Hypertension Other        parent  . Arthritis Other        parent  . Diabetes Maternal Aunt   . Diabetes Maternal Uncle   . Breast cancer Neg Hx     SOCIAL HISTORY Social History   Tobacco Use  . Smoking status: Never Smoker  . Smokeless tobacco: Never Used  Substance Use Topics  . Alcohol use: Not Currently  . Drug use: Not Currently    Types: Marijuana    Comment: 07-28-2018  per pt last smoked 11/21/2018         OPHTHALMIC EXAM:  Base Eye Exam    Visual Acuity (Snellen - Linear)      Right Left   Dist [place] 20/40 20/800   Dist ph [place] 20/30 -1 NI       Tonometry (Tonopen, 2:15 PM)      Right Left   Pressure 18 21       Pupils      Dark Light Shape React APD   Right 3 2 Round Brisk None   Left 3 2 Round Brisk None       Visual Fields (Counting fingers)      Left Right    Full Full       Extraocular Movement      Right Left    Full, Ortho Full, Ortho       Neuro/Psych    Oriented x3: Yes   Mood/Affect: Normal       Dilation    Both eyes: 1.0% Mydriacyl, 2.5% Phenylephrine @ 2:15 PM        Slit Lamp and Fundus Exam    Slit Lamp Exam      Right Left   Lids/Lashes Dermatochalasis - upper lid Dermatochalasis - upper lid   Conjunctiva/Sclera Mild Melanosis Mild Melanosis   Cornea 1+ Punctate epithelial erosions 1+ Punctate epithelial erosions, well healed temporal cataract wounds   Anterior Chamber  Deep, 0.5+cell/pigment Deep, 0.5+cell/pigment   Iris Round and dilated, No NVI Round and dilated, No NVI   Lens PC IOL in good position with trace PCO PC IOL in good position with trace PCO   Vitreous Vitreous syneresis Vitreous syneresis       Fundus Exam      Right Left   Disc Pallor, Sharp rim Compact, Pink and Sharp, mild hyperemia   C/D Ratio 0.1 0.0   Macula Good foveal reflex, +central edema - slightly improved, +Microaneurysms greatest inferiorly and nasally, focal fibrosis inferior macula Blunted foveal reflex, central edema - slightly improved, +edudate/MA/IRH -- slightly improved   Vessels Vascular attenuation, Tortuous Vascular attenuation, Tortuous, +venous beading, AV crossing changes   Periphery Attached, scattered drusen, scattered IRH Attached, scattered DBH greatest posteriorly -- improving        Refraction    Wearing Rx      Sphere   Right OTC   Left OTC          IMAGING AND PROCEDURES  Imaging and Procedures for @TODAY @  OCT, Retina - OU - Both Eyes       Right Eye Quality was good. Central Foveal Thickness: 306. Progression has improved. Findings include abnormal foveal contour, intraretinal fluid, no SRF, intraretinal hyper-reflective material, vitreomacular adhesion  (Interval improvement in foveal contour and IRF).   Left Eye Quality was good. Central Foveal Thickness: 379. Progression has improved. Findings include abnormal foveal contour, intraretinal fluid, subretinal fluid, intraretinal hyper-reflective material, vitreomacular adhesion  (  Interval improvement in IRF, foveal contour, still massive edema).   Notes *Images captured and stored on drive  Diagnosis / Impression:  Macular edema OU - likely combination of CME + DME OU - interval decrease in IRF OU OD: Interval improvement in foveal contour and IRF OS: Interval improvement in IRF, foveal contour, still massive edema  Clinical management:  See below  Abbreviations: NFP - Normal  foveal profile. CME - cystoid macular edema. PED - pigment epithelial detachment. IRF - intraretinal fluid. SRF - subretinal fluid. EZ - ellipsoid zone. ERM - epiretinal membrane. ORA - outer retinal atrophy. ORT - outer retinal tubulation. SRHM - subretinal hyper-reflective material        Intravitreal Injection, Pharmacologic Agent - OD - Right Eye       Time Out 08/22/2019. 2:47 PM. Confirmed correct patient, procedure, site, and patient consented.   Anesthesia Topical anesthesia was used. Anesthetic medications included Lidocaine 2%, Proparacaine 0.5%.   Procedure Preparation included 5% betadine to ocular surface, eyelid speculum. A (32 g ) needle was used.   Injection:  1.25 mg Bevacizumab (AVASTIN) SOLN   NDC: 01751-025-85, Lot: [id number] , Expiration date: 10/26/2019   Route: Intravitreal, Site: Right Eye, Waste: 0 mL  Post-op Post injection exam found visual acuity of at least counting fingers. The patient tolerated the procedure well. There were no complications. The patient received written and verbal post procedure care education.        Intravitreal Injection, Pharmacologic Agent - OS - Left Eye       Time Out 08/22/2019. 2:57 PM. Confirmed correct patient, procedure, site, and patient consented.   Anesthesia Topical anesthesia was used. Anesthetic medications included Lidocaine 2%, Proparacaine 0.5%.   Procedure Preparation included 5% betadine to ocular surface, eyelid speculum. A (32g) needle was used.   Injection:  1.25 mg Bevacizumab (AVASTIN) SOLN   NDC: 09/04/2019, Lot: [id number] , Expiration date: 10/26/2019   Route: Intravitreal, Site: Left Eye, Waste: 0 mg  Post-op Post injection exam found visual acuity of at least counting fingers. The patient tolerated the procedure well. There were no complications. The patient received written and verbal post procedure care education.                 ASSESSMENT/PLAN:    ICD-10-CM   1.  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 Retinal edema  H35.81 OCT, Retina - OU - Both Eyes  3. CME (cystoid macular edema), bilateral  H35.353   4. Essential hypertension  I10   5. Hypertensive retinopathy of both eyes  H35.033   6. Pseudophakia of both eyes  Z96.1     1-3. Moderate Non-proliferative diabetic retinopathy, OU OD with combination DME/CME  - exam shows scattered IRH, edema and tortuous blood vessels OU  - FA (10.23.20) shows leaking MA OU and paracentral petaloid hyperfluorecence OD --> CME/[place]  - OCT shows diabetic macular edema, OU; OD with CME/[place] component contributing as well  - s/p IVA OS #1 (10.23.20), #2 (11.18.20), #3 (12.18.20), #4 (02.24.21)  - s/p IVA OD #1 (11.06.20), #2 (12.18.20), #3 (02.24.21)  - BCVA OD improved to 20/30 from 20/40; OS stable at 20/800  - OCT today shows interval decrease in macular edema and improved foveal contour OU; OS still  massive edema  - recommend IVA OU (OD #4 and OS #5) today, 03.24.21  - pt wishes to proceed  - RBA of procedure discussed, questions answered  - informed consent obtained  - Avastin informed consent form signed and scanned on 12.18.2020  - see procedure note  - cont Pred Forte and Ketorolac QID OU  - f/u in 4 weeks, DFE, OCT, possible injection OU  4,5. Hypertensive retinopathy OU  - discussed importance of tight BP control  - monitor  6. Pseudophakia OU  - s/p CE/IOL OU [place]ane)   OD: 03/07/2019   OS: 02/28/2019  - IOLs in good position  - CME OU as above  - monitor   Ophthalmic Meds Ordered this visit:  Meds ordered this encounter  Medications  . Bevacizumab (AVASTIN) SOLN 1.25 mg       Return in about 4 weeks (around  09/19/2019) for f/u NPDR OU, DFE, OCT.  There are no Patient Instructions on file for this visit.   Explained the diagnoses, plan, and follow up with the patient and they expressed understanding.  Patient expressed understanding of the importance of proper follow up care.   This document serves as a record of services personally performed by Gardiner Sleeper, MD, PhD. It was created on their behalf by Ernest Mallick, OA, an ophthalmic assistant. The creation of this record is the provider's dictation and/or activities during the visit.    Electronically signed by: Ernest Mallick, OA 03.24.2021 3:05 PM  Gardiner Sleeper, M.D., Ph.D. Diseases & Surgery of the Retina and [place] 08/22/2019   I have reviewed the above documentation for accuracy and completeness, and I agree with the above. Gardiner Sleeper, M.D., Ph.D. 08/22/19 3:05 PM   Abbreviations: M myopia (nearsighted); A astigmatism; H hyperopia (farsighted); P presbyopia; Mrx spectacle prescription;  CTL contact lenses; OD right eye; OS left eye; OU both eyes  XT exotropia; ET esotropia; PEK punctate epithelial keratitis; PEE punctate epithelial erosions; DES dry eye syndrome; MGD meibomian gland dysfunction; ATs artificial tears; PFAT's preservative free artificial tears; Barrett nuclear sclerotic cataract; PSC posterior subcapsular cataract; ERM epi-retinal membrane; PVD posterior vitreous detachment; RD retinal detachment; DM diabetes mellitus; DR diabetic retinopathy; NPDR non-proliferative diabetic retinopathy; PDR proliferative diabetic retinopathy; CSME clinically significant macular edema; DME diabetic macular edema; dbh dot blot hemorrhages; CWS cotton wool spot; POAG primary open angle glaucoma; C/D cup-to-disc ratio; HVF humphrey visual field; GVF goldmann visual field; OCT optical coherence tomography; IOP intraocular pressure; BRVO Branch retinal vein occlusion; CRVO central retinal vein occlusion; CRAO  central retinal artery occlusion; BRAO branch retinal artery occlusion; RT retinal tear; SB scleral buckle; PPV pars plana vitrectomy; VH Vitreous hemorrhage; PRP panretinal laser photocoagulation; IVK intravitreal kenalog; VMT vitreomacular traction; MH Macular hole;  NVD neovascularization of the disc; NVE neovascularization elsewhere; AREDS age related eye disease study; ARMD age related macular degeneration; POAG primary open angle glaucoma; EBMD epithelial/anterior basement membrane dystrophy; ACIOL anterior chamber intraocular lens; IOL intraocular lens; PCIOL posterior chamber intraocular lens; Phaco/IOL phacoemulsification with intraocular lens placement; [place] photorefractive keratectomy; LASIK laser assisted in situ keratomileusis; HTN hypertension; DM diabetes mellitus; COPD chronic obstructive pulmonary disease

## 2019-08-22 ENCOUNTER — Ambulatory Visit (INDEPENDENT_AMBULATORY_CARE_PROVIDER_SITE_OTHER): Payer: Medicaid Other | Admitting: Ophthalmology

## 2019-08-22 ENCOUNTER — Encounter (INDEPENDENT_AMBULATORY_CARE_PROVIDER_SITE_OTHER): Payer: Self-pay | Admitting: Ophthalmology

## 2019-08-22 DIAGNOSIS — I10 Essential (primary) hypertension: Secondary | ICD-10-CM

## 2019-08-22 DIAGNOSIS — H35353 Cystoid macular degeneration, bilateral: Secondary | ICD-10-CM | POA: Diagnosis not present

## 2019-08-22 DIAGNOSIS — Z961 Presence of intraocular lens: Secondary | ICD-10-CM

## 2019-08-22 DIAGNOSIS — H35033 Hypertensive retinopathy, bilateral: Secondary | ICD-10-CM

## 2019-08-22 DIAGNOSIS — E113313 Type 2 diabetes mellitus with moderate nonproliferative diabetic retinopathy with macular edema, bilateral: Secondary | ICD-10-CM | POA: Diagnosis not present

## 2019-08-22 DIAGNOSIS — H3581 Retinal edema: Secondary | ICD-10-CM | POA: Diagnosis not present

## 2019-08-22 MED ORDER — BEVACIZUMAB CHEMO INJECTION 1.25MG/0.05ML SYRINGE FOR KALEIDOSCOPE
1.2500 mg | INTRAVITREAL | Status: AC | PRN
  Administered 2019-08-22: 1.25 mg via INTRAVITREAL

## 2019-08-23 ENCOUNTER — Other Ambulatory Visit: Payer: Self-pay | Admitting: Internal Medicine

## 2019-08-23 DIAGNOSIS — E1142 Type 2 diabetes mellitus with diabetic polyneuropathy: Secondary | ICD-10-CM

## 2019-08-23 DIAGNOSIS — IMO0002 Reserved for concepts with insufficient information to code with codable children: Secondary | ICD-10-CM

## 2019-08-23 DIAGNOSIS — I2510 Atherosclerotic heart disease of native coronary artery without angina pectoris: Secondary | ICD-10-CM

## 2019-08-23 DIAGNOSIS — I10 Essential (primary) hypertension: Secondary | ICD-10-CM

## 2019-08-23 MED FILL — LANTUS SOLOSTAR 100 UNITS/M: 100 | 81 days supply | Qty: 45 | Fill #0

## 2019-08-23 MED FILL — HumaLOG 100 UNIT/ML SOLN: 100 | 35 days supply | Qty: 30 | Fill #0

## 2019-08-23 MED FILL — ATORVASTATIN CALCIUM 40 MG: 40 | 30 days supply | Qty: 30 | Fill #2

## 2019-08-23 MED FILL — CARVEDILOL 25 MG TABLET: 25 | 30 days supply | Qty: 60 | Fill #0

## 2019-08-23 MED FILL — ISOSORBIDE MN ER 60 MG TAB: 60 | 90 days supply | Qty: 90 | Fill #6

## 2019-08-27 ENCOUNTER — Other Ambulatory Visit: Payer: Self-pay | Admitting: Pharmacist

## 2019-08-27 DIAGNOSIS — E1159 Type 2 diabetes mellitus with other circulatory complications: Secondary | ICD-10-CM

## 2019-08-27 DIAGNOSIS — Z794 Long term (current) use of insulin: Secondary | ICD-10-CM

## 2019-08-27 MED ORDER — ACCU-CHEK SOFTCLIX LANCETS MISC: 6 refills | Status: DC

## 2019-08-27 MED ORDER — ACCU-CHEK GUIDE VI STRP: ORAL_STRIP | 6 refills | Status: DC

## 2019-08-27 MED ORDER — ACCU-CHEK GUIDE W/DEVICE KIT: 1.0000 | PACK | Freq: Three times a day (TID) | 0 refills | Status: AC

## 2019-08-27 MED ORDER — "INSULIN SYRINGE-NEEDLE U-100 31G X 5/16"" 0.3 ML MISC": 11 refills | Status: DC

## 2019-08-27 MED FILL — TRUEPLUS PEN NDL 32GX5/32: 32G X 4 MM | 25 days supply | Qty: 100 | Fill #0

## 2019-08-28 MED FILL — ACCU-CHEK GUIDE W/DEVICE KI: W/DEVICE | 30 days supply | Qty: 1 | Fill #0

## 2019-08-28 MED FILL — ACCU-CHEK GUIDE TEST STRIP: 30 days supply | Qty: 100 | Fill #0

## 2019-08-28 MED FILL — TRUEPLUS SYR 0.3ML 31GX5/16: 31G X 5/16" | 30 days supply | Qty: 100 | Fill #0

## 2019-08-28 MED FILL — ACCU-CHEK SOFTCLIX LANCETS: 30 days supply | Qty: 100 | Fill #0

## 2019-09-14 ENCOUNTER — Other Ambulatory Visit: Payer: Self-pay | Admitting: Adult Health

## 2019-09-14 ENCOUNTER — Encounter (HOSPITAL_COMMUNITY): Payer: Self-pay

## 2019-09-14 LAB — LIPID PANEL
Chol/HDL Ratio: 3.4 ratio (ref 0.0–4.4)
Cholesterol, Total: 136 mg/dL (ref 100–199)
HDL: 40 mg/dL (ref >39)
LDL Chol Calc (NIH): 75 mg/dL (ref 0–99)
Triglycerides: 113 mg/dL (ref 0–149)
VLDL Cholesterol Cal: 21 mg/dL (ref 5–40)

## 2019-09-14 LAB — BASIC METABOLIC PANEL
BUN/Creatinine Ratio: 18 (ref 12–28)
BUN: 18 mg/dL (ref 8–27)
CO2: 26 mmol/L (ref 20–29)
Calcium: 9.2 mg/dL (ref 8.7–10.3)
Chloride: 104 mmol/L (ref 96–106)
Creatinine, Ser: 0.99 mg/dL (ref 0.57–1.00)
GFR calc Af Amer: 71 mL/min/{1.73_m2} (ref >59)
GFR calc non Af Amer: 62 mL/min/{1.73_m2} (ref >59)
Glucose: 172 mg/dL — ABNORMAL HIGH (ref 65–99)
Potassium: 3.8 mmol/L (ref 3.5–5.2)
Sodium: 143 mmol/L (ref 134–144)

## 2019-09-14 LAB — HEPATIC FUNCTION PANEL
ALT: 13 IU/L (ref 0–32)
AST: 17 IU/L (ref 0–40)
Albumin: 4 g/dL (ref 3.8–4.8)
Alkaline Phosphatase: 126 IU/L — ABNORMAL HIGH (ref 39–117)
Bilirubin Total: 0.5 mg/dL (ref 0.0–1.2)
Bilirubin, Direct: 0.15 mg/dL (ref 0.00–0.40)
Total Protein: 7 g/dL (ref 6.0–8.5)

## 2019-09-17 ENCOUNTER — Telehealth: Payer: Self-pay | Admitting: Oncology

## 2019-09-17 ENCOUNTER — Ambulatory Visit (HOSPITAL_COMMUNITY): Admission: RE | Admit: 2019-09-17 | Payer: Medicaid Other | Source: Ambulatory Visit

## 2019-09-17 ENCOUNTER — Inpatient Hospital Stay: Payer: Medicaid Other | Admitting: Gynecologic Oncology

## 2019-09-17 NOTE — Progress Notes (Deleted)
Follow-up Note: Gyn-Onc  Consult was initially requested by Dr. Elly Modena for the evaluation of Kimberly Frazier 59 y.o. female  CC:  No chief complaint on file.   Assessment/Plan:  [name]  is a 59 y.o.  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 High/intermediate risk factors for recurrence.  Adjuvant radiation was not possible due to prior radiation and adjuvant chemotherapy was declined due to medical comorbidities.   Urinary incontinence - recommend continued follow-up with Dr Matilde Sprang.   I will see her back for follow-up in 3 months.   HPI: [name] is a 59 year old P2 who is seen in consultation at the request of Dr Elly Modena for grade 3 endometrial cancer.  The patient reports a history of light vaginal spotting for approximately 1 year.  Since January 2020 she developed heavy vaginal bleeding and this prompted her to see Dr. Elly Modena who performed a transvaginal ultrasound on June 20, 2018.  This revealed a uterus measuring 7.6 x 3.1 x 4.9 cm, there was a hypoechoic masslike area in the cervix measuring 1.6 x 1.4 x 1.9 cm.  The endometrium was thickened at 14 mm.  It contained an endometrial mass measuring 2.6 cm in greatest dimension.  She was taken to the operating room on August 01, 2018 with Dr. Elly Modena.  She performed the hysteroscopy D&C.  Intraoperative findings were significant for an 8-week size uterus, diffuse proliferative endometrium, but no mention of an endometrial mass.  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  Of note the patient has a history of a Pap smear in January 2020 that was normal with no high risk HPV  detected.  The patient's medical history is complex.  She has a remote history of a gynecologic malignancy in 1999.  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  She describes treatment with primary brachii therapy (she describes being in bed in the hospital for 5 days with radiation internally).  She is unsure if she received chemotherapy.  She did not receive surgery for this.  She received follow-up for approximately a year after treatment and then discontinued.  Her medical history is also significant for coronary artery disease.  The patient had what she describes as a light myocardial infarction in 2016.  This was managed with a coronary angiography, however no stent was placed.  She did not receive ongoing cardiology evaluation as the patient reports that she did not have health insurance.  She was initially treated with medical therapy.  Her only medical therapy at the time of this diagnosis with aspirin 81 mg daily.  Patient has a history of type 2 diabetes mellitus for which she takes insulin.  She reports poor blood glucose control.  She reports not particularly being compliant with her diet.  She has sequelae of diabetic neuropathy in hands and feet and chronic kidney disease.  She has hypertension and hypercholesterolemia.  The patient is morbidly obese with a BMI of 48 kg/m.  Her prior abdominal surgery includes 1 prior cesarean section and, as a child, a midline laparotomy for a hernia repair.  Her family history is significant for father who had a diagnosis of chins lymphoma, and a brother  had a history of colon cancer.  Her CA 125 was elevated to 12% in March, 2020.  She was seen by cardiology who felt that she was moderate/high risk for perioperative cardiac events but that she was maximally optimized for a hysterectomy and did not advocate for CABG beforehand.  CT abd/pelvis on 08/17/18 showed no apparent extrauterine disease. Small ground glass opacities were seen in  the lower lungs -recommended 6 month follow-up.  She has been optimizing her blood glucose control.   The patient underwent robotic assisted total hysterectomy, BSO, SLN biopsy on 11/28/18. Final pathology revealed a high grade serous carcinoma of the endometrium with inner half (5 of 48m) invasion and no LVSI or cervical involvment. SLN's were negative as were the adnexa. She was assigned a stage IA grade 3 serous carcinoma. Her 2 equivocal (2+), and MSI stable, MMR normal.   Due to the high grade nature of her pathology she met criteria for adjuvant therapy with either chemotherapy, vaginal brachytherapy or both. However, she is not a candidate for additional radiation due to her prior radiation for cervical cancer, and has medical comorbidities which would make delivery of chemotherapy challenging.   Postoperatively she did well. Though she developed stress urinary incontinence that was significant.   She was offered adjuvant chemotherapy but declined due to medical comorbidities and anticipated morbidity.  She was referred to Dr MMatilde Sprangfor urinary incontinence.    Interval Hx:  She has no concerning symptoms for recurrence.   Current Meds:  Outpatient Encounter Medications as of 09/17/2019  Medication Sig  . Accu-Chek Softclix Lancets lancets Use to check blood sugar 3 times daily.  .Marland Kitchenacetaminophen (TYLENOL) 500 MG tablet Take 1,000 mg by mouth every 6 (six) hours as needed for moderate pain or headache.  .Marland KitchenamLODipine (NORVASC) 5 MG tablet Take 1 tablet (5 mg total) by mouth daily.  .Marland Kitchenaspirin EC 81 MG tablet Take 1 tablet (81 mg total) daily by mouth.  .Marland Kitchenatorvastatin (LIPITOR) 40 MG tablet TAKE 1 TABLET DAILY BY MOUTH.  . Blood Glucose Monitoring Suppl (ACCU-CHEK GUIDE) w/Device KIT 1 kit by Does not apply route in the morning, at noon, and at bedtime. Use to check blood sugar 3 times daily.  . Bromfenac Sodium (PROLENSA) 0.07 % SOLN Place 1 drop into both eyes 4 (four) times  daily.  . carvedilol (COREG) 25 MG tablet Take 1 tablet (25 mg total) by mouth 2 (two) times daily with a meal.  . gabapentin (NEURONTIN) 300 MG capsule Take 1 capsule (300 mg total) by mouth at bedtime.  .Marland Kitchenglucose blood (ACCU-CHEK GUIDE) test strip Use to check blood sugar 3 times daily.  . insulin lispro (HUMALOG) 100 UNIT/ML injection Inject 0.28 mLs (28 Units total) into the skin 3 (three) times daily with meals.  . Insulin Syringe-Needle U-100 (TRUEPLUS INSULIN SYRINGE) 31G X 5/16" 0.3 ML MISC Use to inject Humalog three times daily.  . isosorbide mononitrate (IMDUR) 60 MG 24 hr tablet Take 1 tablet (60 mg total) by mouth daily.  .Marland KitchenLANTUS SOLOSTAR 100 UNIT/ML Solostar Pen INJECT 55 UNITS INTO THE SKIN DAILY.  .Marland Kitchenliraglutide (VICTOZA) 18 MG/3ML SOPN 0.6 mg subcut daily x 1 wk then 1.2 mg daily thereafter. (Patient not taking: Reported on 08/13/2019)  . nitroGLYCERIN (NITROSTAT) 0.4 MG SL tablet Place 1 tab under tongue for chest pain.  May repeat after 5 minutes x 2.  DO NOT TAKE MORE THAN 3 TABS DURING AN EPISODE OF CHEST PAIN (Patient taking  differently: Place 0.4 mg under the tongue every 5 (five) minutes as needed for chest pain. DO NOT TAKE MORE THAN 3 TABS DURING AN EPISODE OF CHEST PAIN)  . nystatin (MYCOSTATIN/NYSTOP) powder Apply topically 2 (two) times daily. (Patient taking differently: Apply topically as needed. )  . potassium chloride (K-DUR) 10 MEQ tablet Take 2 tablets (20 mEq total) by mouth 2 (two) times daily.  . sitaGLIPtin (JANUVIA) 25 MG tablet Take 2 tablets (50 mg total) by mouth daily. (Patient not taking: Reported on 08/13/2019)  . TRUEPLUS PEN NEEDLES 32G X 4 MM MISC USE AS DIRECTED WITH INSULIN   No facility-administered encounter medications on file as of 09/17/2019.    Allergy: No Known Allergies  Social Hx:   Social History   Socioeconomic History  . Marital status: Single    Spouse name: Not on file  . Number of children: 2  . Years of education: 41  .  Highest education level: Not on file  Occupational History  . Occupation: retired  Tobacco Use  . Smoking status: Never Smoker  . Smokeless tobacco: Never Used  Substance and Sexual Activity  . Alcohol use: Not Currently  . Drug use: Not Currently    Types: Marijuana    Comment: 07-28-2018  per pt last smoked 11/21/2018  . Sexual activity: Yes  Other Topics Concern  . Not on file  Social History Narrative   Regular exercise-no   No caffeine use   Social Determinants of Health   Financial Resource Strain:   . Difficulty of Paying Living Expenses:   Food Insecurity:   . Worried About Charity fundraiser in the Last Year:   . Arboriculturist in the Last Year:   Transportation Needs:   . Film/video editor (Medical):   Marland Kitchen Lack of Transportation (Non-Medical):   Physical Activity:   . Days of Exercise per Week:   . Minutes of Exercise per Session:   Stress:   . Feeling of Stress :   Social Connections:   . Frequency of Communication with Friends and Family:   . Frequency of Social Gatherings with Friends and Family:   . Attends Religious Services:   . Active Member of Clubs or Organizations:   . Attends Archivist Meetings:   Marland Kitchen Marital Status:   Intimate Partner Violence:   . Fear of Current or Ex-Partner:   . Emotionally Abused:   Marland Kitchen Physically Abused:   . Sexually Abused:     Past Surgical Hx:  Past Surgical History:  Procedure Laterality Date  . BREAST BIOPSY  2012   benign  . CARDIAC CATHETERIZATION N/A 02/06/2015   Procedure: Left Heart Cath and Coronary Angiography;  Surgeon: Larey Dresser, MD;  Location: [facility];  Service: Cardiovascular;  Laterality: N/A;  . CATARACT EXTRACTION, BILATERAL    . HYSTEROSCOPY WITH D & C N/A 08/01/2018   Procedure: DILATATION AND CURETTAGE /HYSTEROSCOPY;  Surgeon: Mora Bellman, MD;  Location: [facility];  Service: Gynecology;  Laterality: N/A;  . ROBOTIC ASSISTED TOTAL HYSTERECTOMY WITH  BILATERAL SALPINGO OOPHERECTOMY N/A 11/28/2018   Procedure: XI ROBOTIC ASSISTED TOTAL HYSTERECTOMY WITH BILATERAL SALPINGO OOPHORECTOMY;  Surgeon: Everitt Amber, MD;  Location: [facility];  Service: Gynecology;  Laterality: N/A;  . SENTINEL NODE BIOPSY N/A 11/28/2018   Procedure: SENTINEL NODE BIOPSY;  Surgeon: Everitt Amber, MD;  Location: [facility];  Service: Gynecology;  Laterality: N/A;  . UMBILICAL HERNIA REPAIR  child  Past Medical Hx:  Past Medical History:  Diagnosis Date  . Adrenal adenoma, left   . Arthritis   . Cataract    Bilateral  . CKD (chronic kidney disease), stage II   . Coronary artery disease 07-28-2018 [facility]  currently due to no insurance   per cardiac cath 02-06-2015 (positive mild lateral ishcemia on stress test)--- dLAD 80%,  mLAD 40%,  mPDA 80% (small vessel),  ostial D1 70%,  CFx with lumial irregarlities-- medical management  . Depression   . Diabetic neuropathy ([place])   . History of Bell's palsy 07/2011   per pt residual facial pain on left side occasionally  . History of cancer of vagina 1999   per pt completed radiation and chemo  . History of sepsis 12/30/2017   positive blood culter fro E.coli  . Hyperlipidemia   . Hypertension   . Hypokalemia   . Insulin dependent type 2 diabetes mellitus, uncontrolled ([place])    followed by pcp---  A1c was 11.7 on 06-15-2018 in epic  . Myocardial infarction ([place])   . Nocturia   . Peripheral neuropathy   . PMB (postmenopausal bleeding)   . Wears dentures    upper  . Wears glasses     Past Gynecological History:  See HPI No LMP recorded. Patient is postmenopausal.  Family Hx:  Family History  Problem Relation Age of Onset  . Heart disease Mother   . Hypertension Mother   . Diabetes Mother   . Cancer Father        hodgkins lymphoma  . Heart disease Sister        heart attack  . Diabetes Sister   . Cancer Other        parent  . Diabetes Other        parent  . Heart disease Other         parent  . Hyperlipidemia Other        parent  . Hypertension Other        parent  . Arthritis Other        parent  . Diabetes Maternal Aunt   . Diabetes Maternal Uncle   . Breast cancer Neg Hx     Review of Systems:  Constitutional  Feels well,    ENT Normal appearing ears and nares bilaterally Skin/Breast  No rash, sores, jaundice, itching, dryness Cardiovascular  No chest pain, shortness of breath, or edema  Pulmonary  No cough or wheeze.  Gastro Intestinal  No nausea, vomitting, or diarrhoea. No bright red blood per rectum, no abdominal pain, change in bowel movement, or constipation.  Genito Urinary  No frequency, urgency, dysuria, + incontinence Musculo Skeletal  No myalgia, arthralgia, joint swelling or pain  Neurologic  No weakness, numbness, change in gait,  Psychology  No depression, anxiety, insomnia.   Vitals:  There were no vitals taken for this visit.  Physical Exam: WD in NAD Neck  Supple NROM, without any enlargements.  Lymph Node Survey No cervical supraclavicular or inguinal adenopathy Cardiovascular  Pulse normal rate, regularity and rhythm. S1 and S2 normal.  Lungs  Clear to auscultation bilateraly, without wheezes/crackles/rhonchi. Good air movement.  Skin  No rash/lesions/breakdown  Psychiatry  Alert and oriented to person, place, and time  Abdomen  Normoactive bowel sounds, abdomen soft, non-tender and obese without evidence of hernia. Soft incisions Back No CVA tenderness Genito Urinary  Vulva/vagina: Normal external female genitalia.  No lesions. No discharge or bleeding.  Bladder/urethra:  No lesions or masses, + increased urethral mobility  Vagina: well healed vaginal cuff, no lesions or masses or blood  Cervix: Normal appearing, no lesions. Rectal  deferred  Extremities   No bilateral cyanosis, clubbing or edema.  Thereasa Solo, MD  09/17/2019, 12:33 PM

## 2019-09-17 NOTE — Telephone Encounter (Signed)
Kimberly Frazier left a message to cancel today's appointment with Dr. Denman George.  Called her back and rescheduled the appointment for 09/21/19 at 11:45.

## 2019-09-18 ENCOUNTER — Other Ambulatory Visit: Payer: Self-pay | Admitting: Internal Medicine

## 2019-09-18 NOTE — Progress Notes (Signed)
[facility] Note  09/19/2019     CHIEF COMPLAINT Patient presents for Retina Follow Up   HISTORY OF PRESENT ILLNESS: Kimberly Frazier is a 59 y.o. female who presents to the clinic today for:   HPI    Retina Follow Up    Patient presents with  Diabetic Retinopathy.  In both eyes.  This started 4 weeks ago.  Severity is moderate.  I, the attending physician,  performed the HPI with the patient and updated documentation appropriately.          Comments    Patient here for 4 weeks retina follow up for NPDR OU. Patient states vision about the same. No eye pain.        Last edited by Bernarda Caffey, MD on 09/19/2019  3:02 PM. (History)    pt states she feels like the injections help for the first couple of weeks, but by the time her next appt comes around, her vision is back down, pt is using PF and Ketorolac QID OU  Referring physician: Ladell Pier, MD [place],  [place] [address]  HISTORICAL INFORMATION:   Selected notes from the MEDICAL RECORD NUMBER Referred by Dr. Rutherford Guys for concern of CME  LEE:  Ocular Hx- PMH-   CURRENT MEDICATIONS: Current Outpatient Medications (Ophthalmic Drugs)  Medication Sig  . Bromfenac Sodium (PROLENSA) 0.07 % SOLN Place 1 drop into both eyes 4 (four) times daily.  Marland Kitchen ketorolac (ACULAR) 0.5 % ophthalmic solution Place 1 drop into both eyes 4 (four) times daily.  . prednisoLONE acetate (PRED FORTE) 1 % ophthalmic suspension Place 1 drop into both eyes 4 (four) times daily.   No current facility-administered medications for this visit. (Ophthalmic Drugs)   Current Outpatient Medications (Other)  Medication Sig  . Accu-Chek Softclix Lancets lancets Use to check blood sugar 3 times daily.  Marland Kitchen acetaminophen (TYLENOL) 500 MG tablet Take 1,000 mg by mouth every 6 (six) hours as needed for moderate pain or headache.  Marland Kitchen amLODipine (NORVASC) 5 MG tablet Take 1 tablet (5 mg total) by mouth daily.  Marland Kitchen  aspirin EC 81 MG tablet Take 1 tablet (81 mg total) daily by mouth.  Marland Kitchen atorvastatin (LIPITOR) 40 MG tablet TAKE 1 TABLET DAILY BY MOUTH.  . Blood Glucose Monitoring Suppl (ACCU-CHEK GUIDE) w/Device KIT 1 kit by Does not apply route in the morning, at noon, and at bedtime. Use to check blood sugar 3 times daily.  . carvedilol (COREG) 25 MG tablet Take 1 tablet (25 mg total) by mouth 2 (two) times daily with a meal.  . gabapentin (NEURONTIN) 300 MG capsule Take 1 capsule (300 mg total) by mouth at bedtime.  Marland Kitchen glucose blood (ACCU-CHEK GUIDE) test strip Use to check blood sugar 3 times daily.  . insulin lispro (HUMALOG) 100 UNIT/ML injection Inject 0.28 mLs (28 Units total) into the skin 3 (three) times daily with meals.  . Insulin Syringe-Needle U-100 (TRUEPLUS INSULIN SYRINGE) 31G X 5/16" 0.3 ML MISC Use to inject Humalog three times daily.  . isosorbide mononitrate (IMDUR) 60 MG 24 hr tablet Take 1 tablet (60 mg total) by mouth daily.  Marland Kitchen LANTUS SOLOSTAR 100 UNIT/ML Solostar Pen INJECT 55 UNITS INTO THE SKIN DAILY.  Marland Kitchen liraglutide (VICTOZA) 18 MG/3ML SOPN 0.6 mg subcut daily x 1 wk then 1.2 mg daily thereafter. (Patient not taking: Reported on 08/13/2019)  . nitroGLYCERIN (NITROSTAT) 0.4 MG SL tablet Place 1 tab under tongue for  chest pain.  May repeat after 5 minutes x 2.  DO NOT TAKE MORE THAN 3 TABS DURING AN EPISODE OF CHEST PAIN (Patient taking differently: Place 0.4 mg under the tongue every 5 (five) minutes as needed for chest pain. DO NOT TAKE MORE THAN 3 TABS DURING AN EPISODE OF CHEST PAIN)  . nystatin (MYCOSTATIN/NYSTOP) powder Apply topically 2 (two) times daily. (Patient taking differently: Apply topically as needed. )  . potassium chloride (K-DUR) 10 MEQ tablet Take 2 tablets (20 mEq total) by mouth 2 (two) times daily.  . sitaGLIPtin (JANUVIA) 25 MG tablet Take 2 tablets (50 mg total) by mouth daily. (Patient not taking: Reported on 08/13/2019)  . TRUEPLUS PEN NEEDLES 32G X 4 MM MISC USE AS  DIRECTED WITH INSULIN   No current facility-administered medications for this visit. (Other)      REVIEW OF SYSTEMS: ROS    Positive for: Endocrine, Eyes   Negative for: Constitutional, Gastrointestinal, Neurological, Skin, Genitourinary, Musculoskeletal, HENT, Cardiovascular, Respiratory, Psychiatric, Allergic/Imm, Heme/Lymph   Last edited by Theodore Demark, COA on 09/19/2019  2:41 PM. (History)       ALLERGIES No Known Allergies  PAST MEDICAL HISTORY Past Medical History:  Diagnosis Date  . Adrenal adenoma, left   . Arthritis   . Cataract    Bilateral  . CKD (chronic kidney disease), stage II   . Coronary artery disease 07-28-2018 [facility]  currently due to no insurance   per cardiac cath 02-06-2015 (positive mild lateral ishcemia on stress test)--- dLAD 80%,  mLAD 40%,  mPDA 80% (small vessel),  ostial D1 70%,  CFx with lumial irregarlities-- medical management  . Depression   . Diabetic neuropathy ([place])   . History of Bell's palsy 07/2011   per pt residual facial pain on left side occasionally  . History of cancer of vagina 1999   per pt completed radiation and chemo  . History of sepsis 12/30/2017   positive blood culter fro E.coli  . Hyperlipidemia   . Hypertension   . Hypokalemia   . Insulin dependent type 2 diabetes mellitus, uncontrolled ([place])    followed by pcp---  A1c was 11.7 on 06-15-2018 in epic  . Myocardial infarction ([place])   . Nocturia   . Peripheral neuropathy   . PMB (postmenopausal bleeding)   . Wears dentures    upper  . Wears glasses    Past Surgical History:  Procedure Laterality Date  . BREAST BIOPSY  2012   benign  . CARDIAC CATHETERIZATION N/A 02/06/2015   Procedure: Left Heart Cath and Coronary Angiography;  Surgeon: Larey Dresser, MD;  Location: [facility];  Service: Cardiovascular;  Laterality: N/A;  . CATARACT EXTRACTION, BILATERAL    . HYSTEROSCOPY WITH D & C N/A 08/01/2018   Procedure:  DILATATION AND CURETTAGE /HYSTEROSCOPY;  Surgeon: Mora Bellman, MD;  Location: [facility];  Service: Gynecology;  Laterality: N/A;  . ROBOTIC ASSISTED TOTAL HYSTERECTOMY WITH BILATERAL SALPINGO OOPHERECTOMY N/A 11/28/2018   Procedure: XI ROBOTIC ASSISTED TOTAL HYSTERECTOMY WITH BILATERAL SALPINGO OOPHORECTOMY;  Surgeon: Everitt Amber, MD;  Location: [facility];  Service: Gynecology;  Laterality: N/A;  . SENTINEL NODE BIOPSY N/A 11/28/2018   Procedure: SENTINEL NODE BIOPSY;  Surgeon: Everitt Amber, MD;  Location: [facility];  Service: Gynecology;  Laterality: N/A;  . UMBILICAL HERNIA REPAIR  child    FAMILY HISTORY Family History  Problem Relation Age of Onset  . Heart disease Mother   .  Hypertension Mother   . Diabetes Mother   . Cancer Father        hodgkins lymphoma  . Heart disease Sister        heart attack  . Diabetes Sister   . Cancer Other        parent  . Diabetes Other        parent  . Heart disease Other        parent  . Hyperlipidemia Other        parent  . Hypertension Other        parent  . Arthritis Other        parent  . Diabetes Maternal Aunt   . Diabetes Maternal Uncle   . Breast cancer Neg Hx     SOCIAL HISTORY Social History   Tobacco Use  . Smoking status: Never Smoker  . Smokeless tobacco: Never Used  Substance Use Topics  . Alcohol use: Not Currently  . Drug use: Not Currently    Types: Marijuana    Comment: 07-28-2018  per pt last smoked 11/21/2018         OPHTHALMIC EXAM:  Base Eye Exam    Visual Acuity (Snellen - Linear)      Right Left   Dist [place] 20/40 -1 20/400 -1   Dist ph [place] 20/30 -1 20/350       Tonometry (Tonopen, 2:38 PM)      Right Left   Pressure 18 16       Pupils      Dark Light Shape React APD   Right 3 2 Round Brisk None   Left 3 2 Round Brisk None       Visual Fields (Counting fingers)      Left Right    Full Full       Extraocular Movement      Right Left    Full, Ortho Full, Ortho        Neuro/Psych    Oriented x3: Yes   Mood/Affect: Normal       Dilation    Both eyes: 1.0% Mydriacyl, 2.5% Phenylephrine @ 2:38 PM        Slit Lamp and Fundus Exam    Slit Lamp Exam      Right Left   Lids/Lashes Dermatochalasis - upper lid Dermatochalasis - upper lid   Conjunctiva/Sclera Mild Melanosis Mild Melanosis   Cornea 1+ Punctate epithelial erosions 1+ Punctate epithelial erosions, well healed temporal cataract wounds   Anterior Chamber Deep, 0.5+cell/pigment Deep, 0.5+cell/pigment   Iris Round and dilated, No NVI Round and dilated, No NVI   Lens PC IOL in good position with trace PCO PC IOL in good position with trace PCO   Vitreous Vitreous syneresis Vitreous syneresis       Fundus Exam      Right Left   Disc Pallor, Sharp rim Compact, Pink and Sharp, mild hyperemia   C/D Ratio 0.1 0.0   Macula Good foveal reflex, +central edema - persistent, +Microaneurysms greatest inferiorly and nasally Blunted foveal reflex, central edema - persistent, +edudate/MA/IRH -- persistent   Vessels Vascular attenuation, Tortuous Vascular attenuation, Tortuous, +venous beading, AV crossing changes   Periphery Attached, scattered drusen, scattered IRH Attached, scattered DBH greatest posteriorly -- improving          IMAGING AND PROCEDURES  Imaging and Procedures for _0 @  OCT, Retina - OU - Both Eyes       Right Eye Quality was good. Central Foveal  Thickness: 298. Progression has been stable. Findings include abnormal foveal contour, intraretinal fluid, no SRF, intraretinal hyper-reflective material, vitreomacular adhesion  (Persistent IRF -- no significant improvement from prior).   Left Eye Quality was good. Central Foveal Thickness: 385. Progression has been stable. Findings include abnormal foveal contour, intraretinal fluid, subretinal fluid, intraretinal hyper-reflective material, vitreomacular adhesion  (Persistent IRF -- no significant improvement from prior).    Notes *Images captured and stored on drive  Diagnosis / Impression:  Macular edema OU - likely combination of CME + DME OU OD: Persistent IRF -- no significant improvement from prior OS: Persistent IRF -- minimal improvement from prior  Clinical management:  See below  Abbreviations: NFP - Normal foveal profile. CME - cystoid macular edema. PED - pigment epithelial detachment. IRF - intraretinal fluid. SRF - subretinal fluid. EZ - ellipsoid zone. ERM - epiretinal membrane. ORA - outer retinal atrophy. ORT - outer retinal tubulation. SRHM - subretinal hyper-reflective material        Intravitreal Injection, Pharmacologic Agent - OD - Right Eye       Time Out 09/19/2019. 3:18 PM. Confirmed correct patient, procedure, site, and patient consented.   Anesthesia Topical anesthesia was used. Anesthetic medications included Lidocaine 2%, Proparacaine 0.5%.   Procedure Preparation included 5% betadine to ocular surface, eyelid speculum. A supplied needle was used.   Injection:  1.25 mg Bevacizumab (AVASTIN) SOLN   NDC: 02725-366-44, Lot: [id number] , Expiration date: 11/01/2019   Route: Intravitreal, Site: Right Eye, Waste: 0 mL  Post-op Post injection exam found visual acuity of at least counting fingers. The patient tolerated the procedure well. There were no complications. The patient received written and verbal post procedure care education.        Intravitreal Injection, Pharmacologic Agent - OS - Left Eye       Time Out 09/19/2019. 3:45 PM. Confirmed correct patient, procedure, site, and patient consented.   Anesthesia Topical anesthesia was used. Anesthetic medications included Lidocaine 2%, Proparacaine 0.5%.   Procedure Preparation included 5% betadine to ocular surface, eyelid speculum. A (32g) needle was used.   Injection:  2 mg aflibercept Alfonse Flavors) SOLN   NDC: 03474-259-56, Lot: [id number], Expiration date: 02/25/2020   Route: Intravitreal, Site: Left Eye,  Waste: 0.05 mL  Post-op Post injection exam found visual acuity of at least counting fingers. The patient tolerated the procedure well. There were no complications. The patient received written and verbal post procedure care education.   Notes **SAMPLE MEDICATION ADMINISTERED**                 ASSESSMENT/PLAN:    ICD-10-CM   1. Moderate nonproliferative diabetic retinopathy of both eyes with macular edema associated with type 2 diabetes mellitus (HCC)  J18.8416 Intravitreal Injection, Pharmacologic Agent - OD - Right Eye    Bevacizumab (AVASTIN) SOLN 1.25 mg    Intravitreal Injection, Pharmacologic Agent - OS - Left Eye    aflibercept (EYLEA) SOLN 2 mg  2. Retinal edema  H35.81 OCT, Retina - OU - Both Eyes  3. CME (cystoid macular edema), bilateral  H35.353   4. Essential hypertension  I10   5. Hypertensive retinopathy of both eyes  H35.033   6. Pseudophakia of both eyes  Z96.1     1-3. Moderate Non-proliferative diabetic retinopathy, OU OD with combination DME/CME  - exam shows scattered IRH, edema and tortuous blood vessels OU  - FA (10.23.20) shows leaking MA OU and paracentral petaloid hyperfluorecence OD --> CME/[place]  - OCT shows diabetic macular  edema, OU; OD with CME/[place] component contributing as well  - s/p IVA OS #1 (10.23.20), #2 (11.18.20), #3 (12.18.20), #4 (02.24.21), #5 (03.24.21)  - s/p IVA OD #1 (11.06.20), #2 (12.18.20), #3 (02.24.21), #4 (03.24.21)  - BCVA OD 20/30; OS 20/350  - OCT today shows persistent macular edema OU -- minimal change from prior OU -- ?IVA resistance  - discussed possible switch in medication  - recommend IVA OD #5 and IVE #1 OS -- sample today, 04.21.21  - pt wishes to proceed  - RBA of procedure discussed, questions answered  - informed consent obtained  - Avastin informed consent form signed and scanned on 12.18.2020  - Eylea OS informed consent form signed and scanned on 4.21.2021  - see procedure note  -  Eylea4U benefits investigation started, 04.21.21  - cont Pred Forte and Ketorolac QID OU  - f/u in 4 weeks, DFE, OCT, possible injection OU  4,5. Hypertensive retinopathy OU  - discussed importance of tight BP control  - monitor  6. Pseudophakia OU  - s/p CE/IOL OU Gershon Crane)   OD: 03/07/2019   OS: 02/28/2019  - IOLs in good position  - CME OU as above  - monitor   Ophthalmic Meds Ordered this visit:  Meds ordered this encounter  Medications  . prednisoLONE acetate (PRED FORTE) 1 % ophthalmic suspension    Sig: Place 1 drop into both eyes 4 (four) times daily.    Dispense:  15 mL    Refill:  5  . ketorolac (ACULAR) 0.5 % ophthalmic solution    Sig: Place 1 drop into both eyes 4 (four) times daily.    Dispense:  10 mL    Refill:  5  . Bevacizumab (AVASTIN) SOLN 1.25 mg  . aflibercept (EYLEA) SOLN 2 mg       Return in about 5 weeks (around 10/24/2019) for f/u NPDR OU, DFE, OCT.  There are no Patient Instructions on file for this visit.   Explained the diagnoses, plan, and follow up with the patient and they expressed understanding.  Patient expressed understanding of the importance of proper follow up care.   This document serves as a record of services personally performed by Gardiner Sleeper, MD, PhD. It was created on their behalf by Ernest Mallick, OA, an ophthalmic assistant. The creation of this record is the provider's dictation and/or activities during the visit.    Electronically signed by: Ernest Mallick, OA 04.21.2021 4:28 PM  Gardiner Sleeper, M.D., Ph.D. Diseases & Surgery of the Retina and [place] 09/19/2019   I have reviewed the above documentation for accuracy and completeness, and I agree with the above. Gardiner Sleeper, M.D., Ph.D. 09/19/19 4:28 PM   Abbreviations: M myopia (nearsighted); A astigmatism; H hyperopia (farsighted); P presbyopia; Mrx spectacle prescription;  CTL contact lenses; OD right eye; OS left eye; OU  both eyes  XT exotropia; ET esotropia; PEK punctate epithelial keratitis; PEE punctate epithelial erosions; DES dry eye syndrome; MGD meibomian gland dysfunction; ATs artificial tears; PFAT's preservative free artificial tears; [place] nuclear sclerotic cataract; PSC posterior subcapsular cataract; ERM epi-retinal membrane; PVD posterior vitreous detachment; RD retinal detachment; DM diabetes mellitus; DR diabetic retinopathy; NPDR non-proliferative diabetic retinopathy; PDR proliferative diabetic retinopathy; CSME clinically significant macular edema; DME diabetic macular edema; dbh dot blot hemorrhages; CWS cotton wool spot; POAG primary open angle glaucoma; C/D cup-to-disc ratio; HVF humphrey visual field; GVF goldmann visual field; OCT optical coherence tomography; IOP intraocular pressure;  BRVO Branch retinal vein occlusion; CRVO central retinal vein occlusion; CRAO central retinal artery occlusion; BRAO branch retinal artery occlusion; RT retinal tear; SB scleral buckle; PPV pars plana vitrectomy; VH Vitreous hemorrhage; PRP panretinal laser photocoagulation; IVK intravitreal kenalog; VMT vitreomacular traction; MH Macular hole;  NVD neovascularization of the disc; NVE neovascularization elsewhere; AREDS age related eye disease study; ARMD age related macular degeneration; POAG primary open angle glaucoma; EBMD epithelial/anterior basement membrane dystrophy; ACIOL anterior chamber intraocular lens; IOL intraocular lens; PCIOL posterior chamber intraocular lens; Phaco/IOL phacoemulsification with intraocular lens placement; [place] photorefractive keratectomy; LASIK laser assisted in situ keratomileusis; HTN hypertension; DM diabetes mellitus; COPD chronic obstructive pulmonary disease

## 2019-09-19 ENCOUNTER — Other Ambulatory Visit: Payer: Self-pay

## 2019-09-19 ENCOUNTER — Telehealth: Payer: Self-pay

## 2019-09-19 ENCOUNTER — Encounter (INDEPENDENT_AMBULATORY_CARE_PROVIDER_SITE_OTHER): Payer: Self-pay | Admitting: Ophthalmology

## 2019-09-19 ENCOUNTER — Ambulatory Visit (INDEPENDENT_AMBULATORY_CARE_PROVIDER_SITE_OTHER): Payer: Medicaid Other | Admitting: Ophthalmology

## 2019-09-19 DIAGNOSIS — H3581 Retinal edema: Secondary | ICD-10-CM

## 2019-09-19 DIAGNOSIS — H35353 Cystoid macular degeneration, bilateral: Secondary | ICD-10-CM

## 2019-09-19 DIAGNOSIS — I10 Essential (primary) hypertension: Secondary | ICD-10-CM

## 2019-09-19 DIAGNOSIS — Z961 Presence of intraocular lens: Secondary | ICD-10-CM

## 2019-09-19 DIAGNOSIS — E113313 Type 2 diabetes mellitus with moderate nonproliferative diabetic retinopathy with macular edema, bilateral: Secondary | ICD-10-CM

## 2019-09-19 DIAGNOSIS — H35033 Hypertensive retinopathy, bilateral: Secondary | ICD-10-CM

## 2019-09-19 MED ORDER — KETOROLAC TROMETHAMINE 0.5 % OP SOLN: 1.0000 [drp] | Freq: Four times a day (QID) | OPHTHALMIC | 5 refills | Status: DC

## 2019-09-19 MED ORDER — PREDNISOLONE ACETATE 1 % OP SUSP: 1.0000 [drp] | Freq: Four times a day (QID) | OPHTHALMIC | 5 refills | Status: DC

## 2019-09-19 MED ORDER — BEVACIZUMAB CHEMO INJECTION 1.25MG/0.05ML SYRINGE FOR KALEIDOSCOPE
1.2500 mg | INTRAVITREAL | Status: AC | PRN
  Administered 2019-09-19: 1.25 mg via INTRAVITREAL

## 2019-09-19 MED ORDER — AFLIBERCEPT 2MG/0.05ML IZ SOLN FOR KALEIDOSCOPE
2.0000 mg | INTRAVITREAL | Status: AC | PRN
  Administered 2019-09-19: 16:00:00 2 mg via INTRAVITREAL

## 2019-09-19 MED FILL — KETOROLAC 0.5% OPHTH SOLN: 0.5 | 16 days supply | Qty: 9 | Fill #0

## 2019-09-19 MED FILL — PREDNISOLONE AC 1% EYE DROP: 1 | 37 days supply | Qty: 20 | Fill #0

## 2019-09-19 NOTE — Telephone Encounter (Signed)
Attempted to contact pt x 2 to reschedule current appointment until after CT. Unable to leave message due to someone picking up the phone and hanging up each time.

## 2019-09-20 NOTE — Telephone Encounter (Signed)
Spoke to pt and rescheduled appointment  for 5/6.

## 2019-09-21 ENCOUNTER — Inpatient Hospital Stay: Payer: Medicaid Other | Attending: Gynecologic Oncology | Admitting: Gynecologic Oncology

## 2019-09-21 ENCOUNTER — Other Ambulatory Visit: Payer: Self-pay

## 2019-09-21 ENCOUNTER — Encounter: Payer: Self-pay | Admitting: Gynecologic Oncology

## 2019-09-21 VITALS — BP 142/88 | HR 86 | Temp 98.3°F | Resp 17 | Ht 64.0 in | Wt 279.6 lb

## 2019-09-21 DIAGNOSIS — Z90710 Acquired absence of both cervix and uterus: Secondary | ICD-10-CM

## 2019-09-21 DIAGNOSIS — I2510 Atherosclerotic heart disease of native coronary artery without angina pectoris: Secondary | ICD-10-CM | POA: Diagnosis not present

## 2019-09-21 DIAGNOSIS — E1122 Type 2 diabetes mellitus with diabetic chronic kidney disease: Secondary | ICD-10-CM | POA: Diagnosis not present

## 2019-09-21 DIAGNOSIS — Z79899 Other long term (current) drug therapy: Secondary | ICD-10-CM | POA: Insufficient documentation

## 2019-09-21 DIAGNOSIS — Z7982 Long term (current) use of aspirin: Secondary | ICD-10-CM | POA: Insufficient documentation

## 2019-09-21 DIAGNOSIS — E1136 Type 2 diabetes mellitus with diabetic cataract: Secondary | ICD-10-CM | POA: Insufficient documentation

## 2019-09-21 DIAGNOSIS — E1142 Type 2 diabetes mellitus with diabetic polyneuropathy: Secondary | ICD-10-CM | POA: Insufficient documentation

## 2019-09-21 DIAGNOSIS — Z791 Long term (current) use of non-steroidal anti-inflammatories (NSAID): Secondary | ICD-10-CM | POA: Diagnosis not present

## 2019-09-21 DIAGNOSIS — N393 Stress incontinence (female) (male): Secondary | ICD-10-CM | POA: Insufficient documentation

## 2019-09-21 DIAGNOSIS — N182 Chronic kidney disease, stage 2 (mild): Secondary | ICD-10-CM | POA: Insufficient documentation

## 2019-09-21 DIAGNOSIS — I252 Old myocardial infarction: Secondary | ICD-10-CM | POA: Diagnosis not present

## 2019-09-21 DIAGNOSIS — Z923 Personal history of irradiation: Secondary | ICD-10-CM | POA: Diagnosis not present

## 2019-09-21 DIAGNOSIS — E1165 Type 2 diabetes mellitus with hyperglycemia: Secondary | ICD-10-CM | POA: Insufficient documentation

## 2019-09-21 DIAGNOSIS — Z90722 Acquired absence of ovaries, bilateral: Secondary | ICD-10-CM | POA: Diagnosis not present

## 2019-09-21 DIAGNOSIS — I129 Hypertensive chronic kidney disease with stage 1 through stage 4 chronic kidney disease, or unspecified chronic kidney disease: Secondary | ICD-10-CM | POA: Insufficient documentation

## 2019-09-21 DIAGNOSIS — Z6841 Body Mass Index (BMI) 40.0 and over, adult: Secondary | ICD-10-CM | POA: Diagnosis not present

## 2019-09-21 DIAGNOSIS — C541 Malignant neoplasm of endometrium: Secondary | ICD-10-CM

## 2019-09-21 NOTE — Patient Instructions (Signed)
Please notify Dr Denman George at phone number [phone] if you notice vaginal bleeding, new pelvic or abdominal pains, bloating, feeling full easy, or a change in bladder or bowel function.   Please contact Dr Serita Grit office (at [phone]) in May to request an appointment with her for July, 2021.

## 2019-09-21 NOTE — Progress Notes (Signed)
Follow-up Note: Gyn-Onc  Consult was initially requested by Dr. Elly Modena for the evaluation of Kimberly Frazier 59 y.o. female  CC:  Chief Complaint  Patient presents with  . Endometrial cancer ([place])    Follow up    Assessment/Plan:  [name]  is a 59 y.o.  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 High/intermediate risk factors for recurrence.  Adjuvant radiation was not possible due to prior radiation and adjuvant chemotherapy was declined due to medical comorbidities.   Urinary incontinence - recommend continued follow-up with Dr Matilde Sprang.   I will see her back for follow-up in 3 months.   HPI: [name] is a 59 year old P2 who is seen in consultation at the request of Dr Elly Modena for grade 3 endometrial cancer.  The patient reports a history of light vaginal spotting for approximately 1 year.  Since January 2020 she developed heavy vaginal bleeding and this prompted her to see Dr. Elly Modena who performed a transvaginal ultrasound on June 20, 2018.  This revealed a uterus measuring 7.6 x 3.1 x 4.9 cm, there was a hypoechoic masslike area in the cervix measuring 1.6 x 1.4 x 1.9 cm.  The endometrium was thickened at 14 mm.  It contained an endometrial mass measuring 2.6 cm in greatest dimension.  She was taken to the operating room on August 01, 2018 with Dr. Elly Modena.  She performed the hysteroscopy D&C.  Intraoperative findings were significant for an 8-week size uterus, diffuse proliferative endometrium, but no mention of an endometrial mass.  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  Of note the patient has a history of a Pap smear in  January 2020 that was normal with no high risk HPV detected.  The patient's medical history is complex.  She has a remote history of a gynecologic malignancy in 1999.  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  She describes treatment with primary brachii therapy (she describes being in bed in the hospital for 5 days with radiation internally).  She is unsure if she received chemotherapy.  She did not receive surgery for this.  She received follow-up for approximately a year after treatment and then discontinued.  Her medical history is also significant for coronary artery disease.  The patient had what she describes as a light myocardial infarction in 2016.  This was managed with a coronary angiography, however no stent was placed.  She did not receive ongoing cardiology evaluation as the patient reports that she did not have health insurance.  She was initially treated with medical therapy.  Her only medical therapy at the time of this diagnosis with aspirin 81 mg daily.  Patient has a history of type 2 diabetes mellitus for which she takes insulin.  She reports poor blood glucose control.  She reports not particularly being compliant with her diet.  She has sequelae of diabetic neuropathy in hands and feet and chronic kidney disease.  She has hypertension and hypercholesterolemia.  The patient is morbidly obese with a BMI of 48 kg/m.  Her prior abdominal surgery includes 1 prior cesarean section and, as a child, a midline laparotomy for a hernia repair.  Her family history is significant  for father who had a diagnosis of chins lymphoma, and a brother had a history of colon cancer.  Her CA 125 was elevated to 12% in March, 2020.  She was seen by cardiology who felt that she was moderate/high risk for perioperative cardiac events but that she was maximally optimized for a hysterectomy and did not advocate for CABG beforehand.  CT abd/pelvis on 08/17/18 showed no apparent extrauterine  disease. Small ground glass opacities were seen in the lower lungs -recommended 6 month follow-up.  She has been optimizing her blood glucose control.   The patient underwent robotic assisted total hysterectomy, BSO, SLN biopsy on 11/28/18. Final pathology revealed a high grade serous carcinoma of the endometrium with inner half (5 of 26m) invasion and no LVSI or cervical involvment. SLN's were negative as were the adnexa. She was assigned a stage IA grade 3 serous carcinoma. Her 2 equivocal (2+), and MSI stable, MMR normal.   Due to the high grade nature of her pathology she met criteria for adjuvant therapy with either chemotherapy, vaginal brachytherapy or both. However, she is not a candidate for additional radiation due to her prior radiation for cervical cancer, and has medical comorbidities which would make delivery of chemotherapy challenging.   Postoperatively she did well. Though she developed stress urinary incontinence that was significant.   She was offered adjuvant chemotherapy but declined due to medical comorbidities and anticipated morbidity.  She was referred to Dr MMatilde Sprangfor urinary incontinence.   Interval Hx:  She has no concerning symptoms for recurrence.   Current Meds:  Outpatient Encounter Medications as of 09/21/2019  Medication Sig  . Accu-Chek Softclix Lancets lancets Use to check blood sugar 3 times daily.  .Marland Kitchenacetaminophen (TYLENOL) 500 MG tablet Take 1,000 mg by mouth every 6 (six) hours as needed for moderate pain or headache.  .Marland KitchenamLODipine (NORVASC) 5 MG tablet Take 1 tablet (5 mg total) by mouth daily.  .Marland Kitchenaspirin EC 81 MG tablet Take 1 tablet (81 mg total) daily by mouth.  .Marland Kitchenatorvastatin (LIPITOR) 40 MG tablet TAKE 1 TABLET DAILY BY MOUTH.  . Blood Glucose Monitoring Suppl (ACCU-CHEK GUIDE) w/Device KIT 1 kit by Does not apply route in the morning, at noon, and at bedtime. Use to check blood sugar 3 times daily.  . Bromfenac Sodium (PROLENSA) 0.07 % SOLN  Place 1 drop into both eyes 4 (four) times daily.  . carvedilol (COREG) 25 MG tablet Take 1 tablet (25 mg total) by mouth 2 (two) times daily with a meal.  . gabapentin (NEURONTIN) 300 MG capsule Take 1 capsule (300 mg total) by mouth at bedtime.  .Marland Kitchenglucose blood (ACCU-CHEK GUIDE) test strip Use to check blood sugar 3 times daily.  . insulin lispro (HUMALOG) 100 UNIT/ML injection Inject 0.28 mLs (28 Units total) into the skin 3 (three) times daily with meals.  . Insulin Syringe-Needle U-100 (TRUEPLUS INSULIN SYRINGE) 31G X 5/16" 0.3 ML MISC Use to inject Humalog three times daily.  .Marland Kitchenketorolac (ACULAR) 0.5 % ophthalmic solution Place 1 drop into both eyes 4 (four) times daily.  .Marland KitchenLANTUS SOLOSTAR 100 UNIT/ML Solostar Pen INJECT 55 UNITS INTO THE SKIN DAILY.  .Marland Kitchenliraglutide (VICTOZA) 18 MG/3ML SOPN 0.6 mg subcut daily x 1 wk then 1.2 mg daily thereafter.  . nitroGLYCERIN (NITROSTAT) 0.4 MG SL tablet Place 1 tab under tongue for chest pain.  May repeat after 5 minutes x 2.  DO NOT TAKE MORE THAN 3 TABS DURING AN EPISODE OF  CHEST PAIN (Patient taking differently: Place 0.4 mg under the tongue every 5 (five) minutes as needed for chest pain. DO NOT TAKE MORE THAN 3 TABS DURING AN EPISODE OF CHEST PAIN)  . nystatin (MYCOSTATIN/NYSTOP) powder Apply topically 2 (two) times daily. (Patient taking differently: Apply topically as needed. )  . potassium chloride (K-DUR) 10 MEQ tablet Take 2 tablets (20 mEq total) by mouth 2 (two) times daily.  . prednisoLONE acetate (PRED FORTE) 1 % ophthalmic suspension Place 1 drop into both eyes 4 (four) times daily.  . sitaGLIPtin (JANUVIA) 25 MG tablet Take 2 tablets (50 mg total) by mouth daily.  . TRUEPLUS PEN NEEDLES 32G X 4 MM MISC USE AS DIRECTED WITH INSULIN  . isosorbide mononitrate (IMDUR) 60 MG 24 hr tablet Take 1 tablet (60 mg total) by mouth daily.   No facility-administered encounter medications on file as of 09/21/2019.    Allergy: No Known  Allergies  Social Hx:   Social History   Socioeconomic History  . Marital status: Single    Spouse name: Not on file  . Number of children: 2  . Years of education: 88  . Highest education level: Not on file  Occupational History  . Occupation: retired  Tobacco Use  . Smoking status: Never Smoker  . Smokeless tobacco: Never Used  Substance and Sexual Activity  . Alcohol use: Not Currently  . Drug use: Not Currently    Types: Marijuana    Comment: 07-28-2018  per pt last smoked 11/21/2018  . Sexual activity: Yes  Other Topics Concern  . Not on file  Social History Narrative   Regular exercise-no   No caffeine use   Social Determinants of Health   Financial Resource Strain:   . Difficulty of Paying Living Expenses:   Food Insecurity:   . Worried About Charity fundraiser in the Last Year:   . Arboriculturist in the Last Year:   Transportation Needs:   . Film/video editor (Medical):   Marland Kitchen Lack of Transportation (Non-Medical):   Physical Activity:   . Days of Exercise per Week:   . Minutes of Exercise per Session:   Stress:   . Feeling of Stress :   Social Connections:   . Frequency of Communication with Friends and Family:   . Frequency of Social Gatherings with Friends and Family:   . Attends Religious Services:   . Active Member of Clubs or Organizations:   . Attends Archivist Meetings:   Marland Kitchen Marital Status:   Intimate Partner Violence:   . Fear of Current or Ex-Partner:   . Emotionally Abused:   Marland Kitchen Physically Abused:   . Sexually Abused:     Past Surgical Hx:  Past Surgical History:  Procedure Laterality Date  . BREAST BIOPSY  2012   benign  . CARDIAC CATHETERIZATION N/A 02/06/2015   Procedure: Left Heart Cath and Coronary Angiography;  Surgeon: Larey Dresser, MD;  Location: [facility];  Service: Cardiovascular;  Laterality: N/A;  . CATARACT EXTRACTION, BILATERAL    . HYSTEROSCOPY WITH D & C N/A 08/01/2018   Procedure: DILATATION AND  CURETTAGE /HYSTEROSCOPY;  Surgeon: Mora Bellman, MD;  Location: [facility];  Service: Gynecology;  Laterality: N/A;  . ROBOTIC ASSISTED TOTAL HYSTERECTOMY WITH BILATERAL SALPINGO OOPHERECTOMY N/A 11/28/2018   Procedure: XI ROBOTIC ASSISTED TOTAL HYSTERECTOMY WITH BILATERAL SALPINGO OOPHORECTOMY;  Surgeon: Everitt Amber, MD;  Location: [facility];  Service: Gynecology;  Laterality: N/A;  .  SENTINEL NODE BIOPSY N/A 11/28/2018   Procedure: SENTINEL NODE BIOPSY;  Surgeon: Everitt Amber, MD;  Location: [facility];  Service: Gynecology;  Laterality: N/A;  . UMBILICAL HERNIA REPAIR  child    Past Medical Hx:  Past Medical History:  Diagnosis Date  . Adrenal adenoma, left   . Arthritis   . Cataract    Bilateral  . CKD (chronic kidney disease), stage II   . Coronary artery disease 07-28-2018 [facility]  currently due to no insurance   per cardiac cath 02-06-2015 (positive mild lateral ishcemia on stress test)--- dLAD 80%,  mLAD 40%,  mPDA 80% (small vessel),  ostial D1 70%,  CFx with lumial irregarlities-- medical management  . Depression   . Diabetic neuropathy ([place])   . History of Bell's palsy 07/2011   per pt residual facial pain on left side occasionally  . History of cancer of vagina 1999   per pt completed radiation and chemo  . History of sepsis 12/30/2017   positive blood culter fro E.coli  . Hyperlipidemia   . Hypertension   . Hypokalemia   . Insulin dependent type 2 diabetes mellitus, uncontrolled ([place])    followed by pcp---  A1c was 11.7 on 06-15-2018 in epic  . Myocardial infarction ([place])   . Nocturia   . Peripheral neuropathy   . PMB (postmenopausal bleeding)   . Wears dentures    upper  . Wears glasses     Past Gynecological History:  See HPI No LMP recorded. Patient is postmenopausal.  Family Hx:  Family History  Problem Relation Age of Onset  . Heart disease Mother   . Hypertension Mother   . Diabetes Mother   . Cancer Father         hodgkins lymphoma  . Heart disease Sister        heart attack  . Diabetes Sister   . Cancer Other        parent  . Diabetes Other        parent  . Heart disease Other        parent  . Hyperlipidemia Other        parent  . Hypertension Other        parent  . Arthritis Other        parent  . Diabetes Maternal Aunt   . Diabetes Maternal Uncle   . Breast cancer Neg Hx     Review of Systems:  Constitutional  Feels well,    ENT Normal appearing ears and nares bilaterally Skin/Breast  No rash, sores, jaundice, itching, dryness Cardiovascular  No chest pain, shortness of breath, or edema  Pulmonary  No cough or wheeze.  Gastro Intestinal  No nausea, vomitting, or diarrhoea. No bright red blood per rectum, no abdominal pain, change in bowel movement, or constipation.  Genito Urinary  No frequency, urgency, dysuria, + incontinence Musculo Skeletal  No myalgia, arthralgia, joint swelling or pain  Neurologic  No weakness, numbness, change in gait,  Psychology  No depression, anxiety, insomnia.   Vitals:  Blood pressure (!) 142/88, pulse 86, temperature 98.3 F (36.8 C), temperature source Temporal, resp. rate 17, height '5\' 4"'$  (1.626 m), weight 279 lb 9.6 oz (126.8 kg), SpO2 98 %.  Physical Exam: WD in NAD Neck  Supple NROM, without any enlargements.  Lymph Node Survey No cervical supraclavicular or inguinal adenopathy Cardiovascular  Pulse normal rate, regularity and rhythm. S1 and S2 normal.  Lungs  Clear to  auscultation bilateraly, without wheezes/crackles/rhonchi. Good air movement.  Skin  No rash/lesions/breakdown  Psychiatry  Alert and oriented to person, place, and time  Abdomen  Normoactive bowel sounds, abdomen soft, non-tender and obese without evidence of hernia. Soft incisions Back No CVA tenderness Genito Urinary  Vulva/vagina: Normal external female genitalia.  No lesions. No discharge or bleeding.  Bladder/urethra:  No lesions or masses, +  increased urethral mobility  Vagina: well healed vaginal cuff, no lesions or masses or blood  Cervix: Normal appearing, no lesions. Rectal  deferred  Extremities   No bilateral cyanosis, clubbing or edema.  Thereasa Solo, MD  09/21/2019, 12:00 PM

## 2019-09-26 ENCOUNTER — Ambulatory Visit: Payer: Medicaid Other | Admitting: Cardiology

## 2019-09-26 ENCOUNTER — Telehealth (HOSPITAL_COMMUNITY): Payer: Self-pay | Admitting: Emergency Medicine

## 2019-09-26 ENCOUNTER — Telehealth (HOSPITAL_COMMUNITY): Payer: Self-pay | Admitting: *Deleted

## 2019-09-26 NOTE — Telephone Encounter (Signed)
Reaching out to patient to offer assistance regarding upcoming cardiac imaging study; pt verbalizes understanding of appt date/time, parking situation and where to check in, pre-test NPO status and medications ordered, and verified current allergies; name and call back number provided for further questions should they arise Marchia Bond RN [facility] [phone] office [phone] cell   Per Jory Sims, pt to take PO carvedilol 2 hr prior to scan + 5mg  ivabradine for HR control. I called the [facility] office to arrange free sample be made for patient or her daughter to pick up this afternoon. Pt appreciated the call and information.  Clarise Cruz

## 2019-09-26 NOTE — Telephone Encounter (Signed)
Attempting to reach patient concerning upcoming CT cardiac appointment.  Left message with call back number.

## 2019-09-27 ENCOUNTER — Ambulatory Visit (HOSPITAL_COMMUNITY)
Admission: RE | Admit: 2019-09-27 | Discharge: 2019-09-27 | Disposition: A | Discharge status: Home / Self Care | Payer: Medicaid Other | Source: Ambulatory Visit | Attending: Adult Health | Admitting: Adult Health

## 2019-09-27 ENCOUNTER — Other Ambulatory Visit: Payer: Self-pay

## 2019-09-27 DIAGNOSIS — R0602 Shortness of breath: Secondary | ICD-10-CM

## 2019-09-27 DIAGNOSIS — I2510 Atherosclerotic heart disease of native coronary artery without angina pectoris: Secondary | ICD-10-CM | POA: Diagnosis not present

## 2019-09-27 MED ORDER — DILTIAZEM HCL 25 MG/5ML IV SOLN
15.0000 mg | Freq: Once | INTRAVENOUS | Status: AC
  Administered 2019-09-27: 15 mg via INTRAVENOUS
  Filled 2019-09-27: qty 5

## 2019-09-27 MED ORDER — NITROGLYCERIN 0.4 MG SL SUBL
0.8000 mg | SUBLINGUAL_TABLET | Freq: Once | SUBLINGUAL | Status: AC
  Administered 2019-09-27: 0.8 mg via SUBLINGUAL

## 2019-09-27 MED ORDER — DILTIAZEM HCL 25 MG/5ML IV SOLN
5.0000 mg | Freq: Once | INTRAVENOUS | Status: AC
  Administered 2019-09-27: 5 mg via INTRAVENOUS
  Filled 2019-09-27: qty 5

## 2019-09-27 MED ORDER — NITROGLYCERIN 0.4 MG SL SUBL
SUBLINGUAL_TABLET | SUBLINGUAL | Status: AC
  Filled 2019-09-27: qty 2

## 2019-09-27 MED ORDER — DILTIAZEM HCL 25 MG/5ML IV SOLN
INTRAVENOUS | Status: AC
  Filled 2019-09-27: qty 5

## 2019-09-27 MED ORDER — IOHEXOL 350 MG/ML SOLN
100.0000 mL | Freq: Once | INTRAVENOUS | Status: AC | PRN
  Administered 2019-09-27: 100 mL via INTRAVENOUS

## 2019-09-27 MED ORDER — METOPROLOL TARTRATE 5 MG/5ML IV SOLN
INTRAVENOUS | Status: AC
  Filled 2019-09-27: qty 20

## 2019-09-27 MED ORDER — METOPROLOL TARTRATE 5 MG/5ML IV SOLN
5.0000 mg | INTRAVENOUS | Status: DC | PRN
  Administered 2019-09-27 (×3): 5 mg via INTRAVENOUS

## 2019-09-27 NOTE — Progress Notes (Signed)
CT scan completed. Tolerated well. D/C home in wheelchair. Awake and alert. In no distress. 

## 2019-09-29 DIAGNOSIS — I2510 Atherosclerotic heart disease of native coronary artery without angina pectoris: Secondary | ICD-10-CM | POA: Diagnosis not present

## 2019-10-04 ENCOUNTER — Ambulatory Visit: Payer: Medicaid Other | Admitting: Cardiology

## 2019-10-18 NOTE — Progress Notes (Signed)
[facility] Note  10/24/2019     CHIEF COMPLAINT Patient presents for Retina Follow Up   HISTORY OF PRESENT ILLNESS: Kimberly Frazier is a 59 y.o. female who presents to the clinic today for:   HPI    Retina Follow Up    Patient presents with  Diabetic Retinopathy.  In both eyes.  This started 5 weeks ago.  Severity is moderate.  I, the attending physician,  performed the HPI with the patient and updated documentation appropriately.          Comments    Patient here for 5 weeks retina follow up for NPDR OU. Patient states vision doing fine. Not as much eye pain after injection last time as the time before.       Last edited by Bernarda Caffey, MD on 10/25/2019 10:28 AM. (History)    pt states she feels like the injections help for the first couple of weeks, but by the time her next appt comes around, her vision is back down, pt is using PF and Ketorolac QID OU  Referring physician: Ladell Pier, MD [place],  [place] [address]  HISTORICAL INFORMATION:   Selected notes from the MEDICAL RECORD NUMBER Referred by Dr. Rutherford Guys for concern of CME   CURRENT MEDICATIONS: Current Outpatient Medications (Ophthalmic Drugs)  Medication Sig  . Bromfenac Sodium (PROLENSA) 0.07 % SOLN Place 1 drop into both eyes 4 (four) times daily.  Marland Kitchen ketorolac (ACULAR) 0.5 % ophthalmic solution Place 1 drop into both eyes 4 (four) times daily.  . prednisoLONE acetate (PRED FORTE) 1 % ophthalmic suspension Place 1 drop into both eyes 4 (four) times daily.   No current facility-administered medications for this visit. (Ophthalmic Drugs)   Current Outpatient Medications (Other)  Medication Sig  . Accu-Chek Softclix Lancets lancets Use to check blood sugar 3 times daily.  Marland Kitchen acetaminophen (TYLENOL) 500 MG tablet Take 1,000 mg by mouth every 6 (six) hours as needed for moderate pain or headache.  Marland Kitchen amLODipine (NORVASC) 5 MG tablet TAKE 1 TABLET (5 MG TOTAL)  BY MOUTH DAILY.  Marland Kitchen aspirin EC 81 MG tablet Take 1 tablet (81 mg total) daily by mouth.  Marland Kitchen atorvastatin (LIPITOR) 40 MG tablet TAKE 1 TABLET DAILY BY MOUTH.  . Blood Glucose Monitoring Suppl (ACCU-CHEK GUIDE) w/Device KIT 1 kit by Does not apply route in the morning, at noon, and at bedtime. Use to check blood sugar 3 times daily.  . carvedilol (COREG) 25 MG tablet Take 1 tablet (25 mg total) by mouth 2 (two) times daily with a meal.  . gabapentin (NEURONTIN) 300 MG capsule Take 1 capsule (300 mg total) by mouth at bedtime. (Patient not taking: Reported on 09/27/2019)  . glucose blood (ACCU-CHEK GUIDE) test strip Use to check blood sugar 3 times daily.  . insulin lispro (HUMALOG) 100 UNIT/ML injection Inject 0.28 mLs (28 Units total) into the skin 3 (three) times daily with meals.  . Insulin Syringe-Needle U-100 (TRUEPLUS INSULIN SYRINGE) 31G X 5/16" 0.3 ML MISC Use to inject Humalog three times daily.  . isosorbide mononitrate (IMDUR) 60 MG 24 hr tablet Take 1 tablet (60 mg total) by mouth daily.  Marland Kitchen LANTUS SOLOSTAR 100 UNIT/ML Solostar Pen INJECT 55 UNITS INTO THE SKIN DAILY.  Marland Kitchen liraglutide (VICTOZA) 18 MG/3ML SOPN 0.6 mg subcut daily x 1 wk then 1.2 mg daily thereafter. (Patient not taking: Reported on 09/27/2019)  . nitroGLYCERIN (NITROSTAT) 0.4 MG SL  tablet Place 1 tab under tongue for chest pain.  May repeat after 5 minutes x 2.  DO NOT TAKE MORE THAN 3 TABS DURING AN EPISODE OF CHEST PAIN (Patient taking differently: Place 0.4 mg under the tongue every 5 (five) minutes as needed for chest pain. DO NOT TAKE MORE THAN 3 TABS DURING AN EPISODE OF CHEST PAIN)  . nystatin (MYCOSTATIN/NYSTOP) powder Apply topically 2 (two) times daily. (Patient taking differently: Apply topically as needed. )  . potassium chloride (K-DUR) 10 MEQ tablet Take 2 tablets (20 mEq total) by mouth 2 (two) times daily.  . sitaGLIPtin (JANUVIA) 25 MG tablet Take 2 tablets (50 mg total) by mouth daily. (Patient not taking:  Reported on 09/27/2019)  . TRUEPLUS PEN NEEDLES 32G X 4 MM MISC USE AS DIRECTED WITH INSULIN   No current facility-administered medications for this visit. (Other)      REVIEW OF SYSTEMS: ROS    Positive for: Endocrine, Eyes   Negative for: Constitutional, Gastrointestinal, Neurological, Skin, Genitourinary, Musculoskeletal, HENT, Cardiovascular, Respiratory, Psychiatric, Allergic/Imm, Heme/Lymph   Last edited by Theodore Demark, COA on 10/24/2019  2:05 PM. (History)       ALLERGIES No Known Allergies  PAST MEDICAL HISTORY Past Medical History:  Diagnosis Date  . Adrenal adenoma, left   . Arthritis   . Cataract    Bilateral  . CKD (chronic kidney disease), stage II   . Coronary artery disease 07-28-2018 [facility]  currently due to no insurance   per cardiac cath 02-06-2015 (positive mild lateral ishcemia on stress test)--- dLAD 80%,  mLAD 40%,  mPDA 80% (small vessel),  ostial D1 70%,  CFx with lumial irregarlities-- medical management  . Depression   . Diabetic neuropathy ([place])   . History of Bell's palsy 07/2011   per pt residual facial pain on left side occasionally  . History of cancer of vagina 1999   per pt completed radiation and chemo  . History of sepsis 12/30/2017   positive blood culter fro E.coli  . Hyperlipidemia   . Hypertension   . Hypokalemia   . Insulin dependent type 2 diabetes mellitus, uncontrolled ([place])    followed by pcp---  A1c was 11.7 on 06-15-2018 in epic  . Myocardial infarction ([place])   . Nocturia   . Peripheral neuropathy   . PMB (postmenopausal bleeding)   . Wears dentures    upper  . Wears glasses    Past Surgical History:  Procedure Laterality Date  . BREAST BIOPSY  2012   benign  . CARDIAC CATHETERIZATION N/A 02/06/2015   Procedure: Left Heart Cath and Coronary Angiography;  Surgeon: Larey Dresser, MD;  Location: [facility];  Service: Cardiovascular;  Laterality: N/A;  . CATARACT EXTRACTION,  BILATERAL    . HYSTEROSCOPY WITH D & C N/A 08/01/2018   Procedure: DILATATION AND CURETTAGE /HYSTEROSCOPY;  Surgeon: Mora Bellman, MD;  Location: [facility];  Service: Gynecology;  Laterality: N/A;  . ROBOTIC ASSISTED TOTAL HYSTERECTOMY WITH BILATERAL SALPINGO OOPHERECTOMY N/A 11/28/2018   Procedure: XI ROBOTIC ASSISTED TOTAL HYSTERECTOMY WITH BILATERAL SALPINGO OOPHORECTOMY;  Surgeon: Everitt Amber, MD;  Location: [facility];  Service: Gynecology;  Laterality: N/A;  . SENTINEL NODE BIOPSY N/A 11/28/2018   Procedure: SENTINEL NODE BIOPSY;  Surgeon: Everitt Amber, MD;  Location: [facility];  Service: Gynecology;  Laterality: N/A;  . UMBILICAL HERNIA REPAIR  child    FAMILY HISTORY Family History  Problem Relation Age of Onset  .  Heart disease Mother   . Hypertension Mother   . Diabetes Mother   . Cancer Father        hodgkins lymphoma  . Heart disease Sister        heart attack  . Diabetes Sister   . Cancer Other        parent  . Diabetes Other        parent  . Heart disease Other        parent  . Hyperlipidemia Other        parent  . Hypertension Other        parent  . Arthritis Other        parent  . Diabetes Maternal Aunt   . Diabetes Maternal Uncle   . Breast cancer Neg Hx     SOCIAL HISTORY Social History   Tobacco Use  . Smoking status: Never Smoker  . Smokeless tobacco: Never Used  Substance Use Topics  . Alcohol use: Not Currently  . Drug use: Not Currently    Types: Marijuana    Comment: 07-28-2018  per pt last smoked 11/21/2018         OPHTHALMIC EXAM:  Base Eye Exam    Visual Acuity (Snellen - Linear)      Right Left   Dist [place] 20/30 -1 20/250 -1   Dist ph [place] NI 20/200 -2       Tonometry (Tonopen, 2:01 PM)      Right Left   Pressure 21 19       Pupils      Dark Light Shape React APD   Right 3 2 Round Brisk None   Left 3 2 Round Brisk None       Visual Fields (Counting fingers)      Left Right    Full Full       Extraocular  Movement      Right Left    Full, Ortho Full, Ortho       Neuro/Psych    Oriented x3: Yes   Mood/Affect: Normal       Dilation    Both eyes: 1.0% Mydriacyl, 2.5% Phenylephrine @ 2:01 PM        Slit Lamp and Fundus Exam    Slit Lamp Exam      Right Left   Lids/Lashes Dermatochalasis - upper lid Dermatochalasis - upper lid   Conjunctiva/Sclera Mild Melanosis Mild Melanosis   Cornea 1+ Punctate epithelial erosions 1+ Punctate epithelial erosions, well healed temporal cataract wounds   Anterior Chamber Deep, 0.5+cell/pigment Deep, 0.5+cell/pigment   Iris Round and dilated, No NVI Round and dilated, No NVI   Lens PC IOL in good position with trace PCO PC IOL in good position with trace PCO   Vitreous Vitreous syneresis Vitreous syneresis       Fundus Exam      Right Left   Disc Pallor, Sharp rim Compact, Pink and Sharp, mild hyperemia   C/D Ratio 0.1 0.0   Macula Good foveal reflex, +central edema - persistent, +Microaneurysms greatest inferiorly and nasally Blunted foveal reflex, central edema - persistent; improved, +edudate/MA--improved/IRH -- persistent   Vessels Vascular attenuation, Tortuous Vascular attenuation, Tortuous, +venous beading, AV crossing changes   Periphery Attached, scattered drusen, scattered IRH Attached, scattered DBH greatest posteriorly -- improving          IMAGING AND PROCEDURES  Imaging and Procedures for _0 @  OCT, Retina - OU - Both Eyes  Right Eye Quality was good. Central Foveal Thickness: 296. Progression has been stable. Findings include abnormal foveal contour, intraretinal fluid, no SRF, intraretinal hyper-reflective material, vitreomacular adhesion  (Persistent IRF -- trace interval improvement inferior macula).   Left Eye Quality was good. Central Foveal Thickness: 353. Progression has improved. Findings include abnormal foveal contour, intraretinal fluid, subretinal fluid, intraretinal hyper-reflective material,  vitreomacular adhesion  (Persistent IRF -- mild interval improvement).   Notes *Images captured and stored on drive  Diagnosis / Impression:  Macular edema OU - likely combination of CME + DME OU OD: Persistent IRF -- trace interval improvement inferior macula OS: Persistent IRF -- mild interval improvement  Clinical management:  See below  Abbreviations: NFP - Normal foveal profile. CME - cystoid macular edema. PED - pigment epithelial detachment. IRF - intraretinal fluid. SRF - subretinal fluid. EZ - ellipsoid zone. ERM - epiretinal membrane. ORA - outer retinal atrophy. ORT - outer retinal tubulation. SRHM - subretinal hyper-reflective material        Intravitreal Injection, Pharmacologic Agent - OD - Right Eye       Time Out 10/24/2019. 3:45 PM. Confirmed correct patient, procedure, site, and patient consented.   Anesthesia Topical anesthesia was used. Anesthetic medications included Lidocaine 2%, Proparacaine 0.5%.   Procedure Preparation included 5% betadine to ocular surface, eyelid speculum. A supplied needle was used.   Injection:  1.25 mg Bevacizumab (AVASTIN) SOLN   NDC: 23762-831-51, Lot: [id number] , Expiration date: 11/01/2019   Route: Intravitreal, Site: Right Eye, Waste: 0 mL  Post-op Post injection exam found visual acuity of at least counting fingers. The patient tolerated the procedure well. There were no complications. The patient received written and verbal post procedure care education.        Intravitreal Injection, Pharmacologic Agent - OS - Left Eye       Time Out 10/24/2019. 3:47 PM. Confirmed correct patient, procedure, site, and patient consented.   Anesthesia Topical anesthesia was used. Anesthetic medications included Lidocaine 2%, Proparacaine 0.5%.   Procedure Preparation included 5% betadine to ocular surface, eyelid speculum. A (33g) needle was used.   Injection:  2 mg aflibercept Alfonse Flavors) SOLN   NDC: 76160-737-10, Lot: [id number],  Expiration date: 03/26/2020   Route: Intravitreal, Site: Left Eye, Waste: 0.05 mL  Post-op Post injection exam found visual acuity of at least counting fingers. The patient tolerated the procedure well. There were no complications. The patient received written and verbal post procedure care education.   Notes **SAMPLE MEDICATION ADMINISTERED**                ASSESSMENT/PLAN:    ICD-10-CM   1. 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 Retinal edema  H35.81 OCT, Retina - OU - Both Eyes  3. CME (cystoid macular edema), bilateral  H35.353   4. Essential hypertension  I10   5. Hypertensive retinopathy of both eyes  H35.033   6. Pseudophakia of both eyes  Z96.1     1-3. Moderate Non-proliferative diabetic retinopathy, OU OD with combination DME/CME  - exam shows scattered IRH, edema and tortuous blood vessels OU  - FA (10.23.20) shows leaking MA OU and paracentral petaloid hyperfluorecence OD --> CME/[place]  - OCT shows  diabetic macular edema, OU; OD with CME/[place] component contributing as well  - s/p IVA OS #1 (10.23.20), #2 (11.18.20), #3 (12.18.20), #4 (02.24.21), #5 (03.24.21)  - s/p IVA OD #1 (11.06.20), #2 (12.18.20), #3 (02.24.21), #4 (03.24.21), #5 (04.21.21)  - s/p IVE OS #1 (04.21.21)  - BCVA OD 20/30 (stable); OS 20/200 from 20/350  - OCT today shows persistent macular edema OU -- mild interval improvement; good response to IVE OS  - recommend IVA OD #6 and IVE #2 OS -- sample today, 05.26.21  - pt wishes to proceed  - RBA of procedure discussed, questions answered  - informed consent obtained  - Avastin informed consent form signed and scanned on 12.18.2020  - Eylea  OS informed consent form signed and scanned on 4.21.2021  - see procedure note  - Eylea4U benefits investigation started, 04.21.21  - cont Pred Forte and Ketorolac QID OU  - f/u in 4 weeks, DFE, OCT, possible injection OU  4,5. Hypertensive retinopathy OU  - discussed importance of tight BP control  - monitor  6. Pseudophakia OU  - s/p CE/IOL OU Gershon Crane)   OD: 03/07/2019   OS: 02/28/2019  - IOLs in good position  - CME OU as above  - monitor   Ophthalmic Meds Ordered this visit:  Meds ordered this encounter  Medications  . aflibercept (EYLEA) SOLN 2 mg  . Bevacizumab (AVASTIN) SOLN 1.25 mg       Return in about 4 weeks (around 11/21/2019) for DFE, OCT, poss inj..  There are no Patient Instructions on file for this visit.   Explained the diagnoses, plan, and follow up with the patient and they expressed understanding.  Patient expressed understanding of the importance of proper follow up care.   Gardiner Sleeper, M.D., Ph.D. Diseases & Surgery of the Retina and [place] 10/24/2019   I have reviewed the above documentation for accuracy and completeness, and I agree with the above. Gardiner Sleeper, M.D., Ph.D. 10/25/19 10:32 AM   Abbreviations: M myopia (nearsighted); A astigmatism; H hyperopia (farsighted); P presbyopia; Mrx spectacle prescription;  CTL contact lenses; OD right eye; OS left eye; OU both eyes  XT exotropia; ET esotropia; PEK punctate epithelial keratitis; PEE punctate epithelial erosions; DES dry eye syndrome; MGD meibomian gland dysfunction; ATs artificial tears; PFAT's preservative free artificial tears; [place] nuclear sclerotic cataract; PSC posterior subcapsular cataract; ERM epi-retinal membrane; PVD posterior vitreous detachment; RD retinal detachment; DM diabetes mellitus; DR diabetic retinopathy; NPDR non-proliferative diabetic retinopathy; PDR proliferative diabetic retinopathy; CSME clinically significant macular  edema; DME diabetic macular edema; dbh dot blot hemorrhages; CWS cotton wool spot; POAG primary open angle glaucoma; C/D cup-to-disc ratio; HVF humphrey visual field; GVF goldmann visual field; OCT optical coherence tomography; IOP intraocular pressure; BRVO Branch retinal vein occlusion; CRVO central retinal vein occlusion; CRAO central retinal artery occlusion; BRAO branch retinal artery occlusion; RT retinal tear; SB scleral buckle; PPV pars plana vitrectomy; VH Vitreous hemorrhage; PRP panretinal laser photocoagulation; IVK intravitreal kenalog; VMT vitreomacular traction; MH Macular hole;  NVD neovascularization of the disc; NVE neovascularization elsewhere; AREDS age related eye disease study; ARMD age related macular degeneration; POAG primary open angle glaucoma; EBMD epithelial/anterior basement membrane dystrophy; ACIOL anterior chamber intraocular lens; IOL intraocular lens; PCIOL posterior chamber intraocular lens; Phaco/IOL phacoemulsification with intraocular lens placement; [place] photorefractive keratectomy; LASIK laser assisted in situ keratomileusis; HTN hypertension; DM diabetes mellitus; COPD chronic obstructive pulmonary disease

## 2019-10-22 ENCOUNTER — Other Ambulatory Visit: Payer: Self-pay | Admitting: Internal Medicine

## 2019-10-22 DIAGNOSIS — I2510 Atherosclerotic heart disease of native coronary artery without angina pectoris: Secondary | ICD-10-CM

## 2019-10-22 DIAGNOSIS — I10 Essential (primary) hypertension: Secondary | ICD-10-CM

## 2019-10-22 DIAGNOSIS — IMO0002 Reserved for concepts with insufficient information to code with codable children: Secondary | ICD-10-CM

## 2019-10-23 MED FILL — ATORVASTATIN CALCIUM 40 MG: 40 | 30 days supply | Qty: 30 | Fill #0

## 2019-10-23 MED FILL — AMLODIPINE BESYLATE 5 MG TA: 5 | 30 days supply | Qty: 30 | Fill #0

## 2019-10-24 ENCOUNTER — Other Ambulatory Visit: Payer: Self-pay

## 2019-10-24 ENCOUNTER — Encounter (INDEPENDENT_AMBULATORY_CARE_PROVIDER_SITE_OTHER): Payer: Self-pay | Admitting: Ophthalmology

## 2019-10-24 ENCOUNTER — Ambulatory Visit (INDEPENDENT_AMBULATORY_CARE_PROVIDER_SITE_OTHER): Payer: Medicare Other | Admitting: Ophthalmology

## 2019-10-24 DIAGNOSIS — E113313 Type 2 diabetes mellitus with moderate nonproliferative diabetic retinopathy with macular edema, bilateral: Secondary | ICD-10-CM | POA: Diagnosis not present

## 2019-10-24 DIAGNOSIS — H35353 Cystoid macular degeneration, bilateral: Secondary | ICD-10-CM | POA: Diagnosis not present

## 2019-10-24 DIAGNOSIS — Z961 Presence of intraocular lens: Secondary | ICD-10-CM

## 2019-10-24 DIAGNOSIS — H35033 Hypertensive retinopathy, bilateral: Secondary | ICD-10-CM

## 2019-10-24 DIAGNOSIS — H3581 Retinal edema: Secondary | ICD-10-CM

## 2019-10-24 DIAGNOSIS — I10 Essential (primary) hypertension: Secondary | ICD-10-CM

## 2019-10-25 ENCOUNTER — Encounter (INDEPENDENT_AMBULATORY_CARE_PROVIDER_SITE_OTHER): Payer: Self-pay | Admitting: Ophthalmology

## 2019-10-25 MED ORDER — BEVACIZUMAB CHEMO INJECTION 1.25MG/0.05ML SYRINGE FOR KALEIDOSCOPE
1.2500 mg | INTRAVITREAL | Status: AC | PRN
  Administered 2019-10-25: 1.25 mg via INTRAVITREAL

## 2019-10-25 MED ORDER — AFLIBERCEPT 2MG/0.05ML IZ SOLN FOR KALEIDOSCOPE
2.0000 mg | INTRAVITREAL | Status: AC | PRN
  Administered 2019-10-25: 2 mg via INTRAVITREAL

## 2019-10-30 ENCOUNTER — Other Ambulatory Visit: Payer: Self-pay | Admitting: Internal Medicine

## 2019-10-30 DIAGNOSIS — IMO0002 Reserved for concepts with insufficient information to code with codable children: Secondary | ICD-10-CM

## 2019-10-31 MED FILL — TRUEPLUS PEN NDL 32GX5/32: 32G X 4 MM | 30 days supply | Qty: 100 | Fill #0

## 2019-11-12 ENCOUNTER — Other Ambulatory Visit: Payer: Self-pay

## 2019-11-12 ENCOUNTER — Ambulatory Visit: Payer: Medicare Other | Attending: Internal Medicine | Admitting: Internal Medicine

## 2019-11-12 ENCOUNTER — Encounter: Payer: Self-pay | Admitting: Internal Medicine

## 2019-11-12 VITALS — BP 139/85 | HR 85 | Temp 96.8°F | Resp 16 | Wt 281.2 lb

## 2019-11-12 DIAGNOSIS — E785 Hyperlipidemia, unspecified: Secondary | ICD-10-CM | POA: Diagnosis not present

## 2019-11-12 DIAGNOSIS — Z791 Long term (current) use of non-steroidal anti-inflammatories (NSAID): Secondary | ICD-10-CM | POA: Diagnosis not present

## 2019-11-12 DIAGNOSIS — Z79899 Other long term (current) drug therapy: Secondary | ICD-10-CM | POA: Diagnosis not present

## 2019-11-12 DIAGNOSIS — Z8349 Family history of other endocrine, nutritional and metabolic diseases: Secondary | ICD-10-CM | POA: Insufficient documentation

## 2019-11-12 DIAGNOSIS — Z6841 Body Mass Index (BMI) 40.0 and over, adult: Secondary | ICD-10-CM | POA: Insufficient documentation

## 2019-11-12 DIAGNOSIS — Z90722 Acquired absence of ovaries, bilateral: Secondary | ICD-10-CM | POA: Diagnosis not present

## 2019-11-12 DIAGNOSIS — Z90710 Acquired absence of both cervix and uterus: Secondary | ICD-10-CM | POA: Diagnosis not present

## 2019-11-12 DIAGNOSIS — Z7982 Long term (current) use of aspirin: Secondary | ICD-10-CM | POA: Insufficient documentation

## 2019-11-12 DIAGNOSIS — I119 Hypertensive heart disease without heart failure: Secondary | ICD-10-CM | POA: Insufficient documentation

## 2019-11-12 DIAGNOSIS — B349 Viral infection, unspecified: Secondary | ICD-10-CM | POA: Diagnosis not present

## 2019-11-12 DIAGNOSIS — E11319 Type 2 diabetes mellitus with unspecified diabetic retinopathy without macular edema: Secondary | ICD-10-CM | POA: Insufficient documentation

## 2019-11-12 DIAGNOSIS — I2510 Atherosclerotic heart disease of native coronary artery without angina pectoris: Secondary | ICD-10-CM | POA: Insufficient documentation

## 2019-11-12 DIAGNOSIS — D3502 Benign neoplasm of left adrenal gland: Secondary | ICD-10-CM | POA: Insufficient documentation

## 2019-11-12 DIAGNOSIS — B9789 Other viral agents as the cause of diseases classified elsewhere: Secondary | ICD-10-CM

## 2019-11-12 DIAGNOSIS — Z794 Long term (current) use of insulin: Secondary | ICD-10-CM | POA: Insufficient documentation

## 2019-11-12 DIAGNOSIS — Z8544 Personal history of malignant neoplasm of other female genital organs: Secondary | ICD-10-CM | POA: Diagnosis not present

## 2019-11-12 DIAGNOSIS — I10 Essential (primary) hypertension: Secondary | ICD-10-CM | POA: Diagnosis not present

## 2019-11-12 DIAGNOSIS — Z923 Personal history of irradiation: Secondary | ICD-10-CM | POA: Diagnosis not present

## 2019-11-12 DIAGNOSIS — E1142 Type 2 diabetes mellitus with diabetic polyneuropathy: Secondary | ICD-10-CM | POA: Diagnosis not present

## 2019-11-12 DIAGNOSIS — Z8249 Family history of ischemic heart disease and other diseases of the circulatory system: Secondary | ICD-10-CM | POA: Diagnosis not present

## 2019-11-12 DIAGNOSIS — G43009 Migraine without aura, not intractable, without status migrainosus: Secondary | ICD-10-CM | POA: Insufficient documentation

## 2019-11-12 DIAGNOSIS — H538 Other visual disturbances: Secondary | ICD-10-CM | POA: Insufficient documentation

## 2019-11-12 DIAGNOSIS — J988 Other specified respiratory disorders: Secondary | ICD-10-CM

## 2019-11-12 DIAGNOSIS — Z833 Family history of diabetes mellitus: Secondary | ICD-10-CM | POA: Diagnosis not present

## 2019-11-12 DIAGNOSIS — Z20822 Contact with and (suspected) exposure to covid-19: Secondary | ICD-10-CM | POA: Diagnosis not present

## 2019-11-12 DIAGNOSIS — E1159 Type 2 diabetes mellitus with other circulatory complications: Secondary | ICD-10-CM | POA: Diagnosis present

## 2019-11-12 LAB — POCT GLYCOSYLATED HEMOGLOBIN (HGB A1C): HbA1c, POC (controlled diabetic range): 9.4 % — AB (ref 0.0–7.0)

## 2019-11-12 LAB — GLUCOSE, POCT (MANUAL RESULT ENTRY): POC Glucose: 134 mg/dl — AB (ref 70–99)

## 2019-11-12 MED ORDER — BENZONATATE 100 MG PO CAPS: 100.0000 mg | ORAL_CAPSULE | Freq: Three times a day (TID) | ORAL | 0 refills | Status: DC | PRN

## 2019-11-12 MED ORDER — LANTUS SOLOSTAR 100 UNIT/ML ~~LOC~~ SOPN: PEN_INJECTOR | SUBCUTANEOUS | 6 refills | Status: DC

## 2019-11-12 MED ORDER — BUTALBITAL-APAP-CAFFEINE 50-325-40 MG PO TABS: 1.0000 | ORAL_TABLET | Freq: Two times a day (BID) | ORAL | 0 refills | Status: DC | PRN

## 2019-11-12 MED ORDER — ALBUTEROL SULFATE HFA 108 (90 BASE) MCG/ACT IN AERS: 2.0000 | INHALATION_SPRAY | Freq: Four times a day (QID) | RESPIRATORY_TRACT | 0 refills | Status: DC | PRN

## 2019-11-12 MED ORDER — DAPAGLIFLOZIN PROPANEDIOL 5 MG PO TABS: 5.0000 mg | ORAL_TABLET | Freq: Every day | ORAL | 2 refills | Status: DC

## 2019-11-12 MED FILL — BUTALB-ACETAMIN-CAFF 50-325: 50-325-40 | 7 days supply | Qty: 30 | Fill #0

## 2019-11-12 MED FILL — BENZONATATE 100 MG CAPS: 100 | 6 days supply | Qty: 20 | Fill #0

## 2019-11-12 MED FILL — LANTUS SOLOSTAR 100 UNITS/M: 100 | 27 days supply | Qty: 15 | Fill #0

## 2019-11-12 NOTE — Patient Instructions (Addendum)
Follow-up with the clinical pharmacist in 2 weeks with your blood sugar readings.  We have added a new diabetes medication called Farxiga 5 mg daily.  I have sent this prescription to our pharmacy for pickup today.  Check blood sugar 2 times a day before meals and bring in readings in 2 weeks when you see the clinical pharmacist.   We have added a medication called Fioricet for you to use as needed for the headaches.  I have referred you to a neurologist for the same.  You have an acute respiratory illness that may be COVID-19 infection.  I have prescribed a cough medication called Tessalon Perles and an inhaler for you to use as needed.  You should self isolate until the results of the Covid test is back.  3 Key Steps to Take While Waiting for Your COVID-19 Test Result To help stop the spread of COVID-19, take these 3 key steps NOW while waiting for your test results: 1. Stay home and monitor your health. Stay home and monitor your health to help protect your friends, family, and others from possibly getting COVID-19 from you. Stay home and away from others:  If possible, stay away from others, especially people who are at higher risk for getting very sick from COVID-19, such as older adults and people with other medical conditions.  If you have been in contact with someone with COVID-19, stay home and away from others for 14 days after your last contact with that person.  If you have a fever, cough or other symptoms of COVID-19, stay home and away from others (except to get medical care). Monitor your health:  Watch for fever, cough, shortness of breath, or other symptoms of COVID-19. Remember, symptoms may appear 2-14 days after exposure to COVID-19 and can include: ? Fever or chills ? Cough ? Shortness of breath or difficulty breathing ? Tiredness ? Muscle or body aches ? Headache ? New loss of taste or smell ? Sore throat ? Congestion or runny nose ? Nausea or  vomiting ? Diarrhea 2. Think about the people you have recently been around. If you are diagnosed with HUDJS-97, a public health worker may call you to check on your health, discuss who you have been around, and ask where you spent time while you may have been able to spread COVID-19 to others. While you wait for your COVID-19 test result, think about everyone you have been around recently. This will be important information to give health workers if your test is positive.  Complete the information on the back of this page to help you remember everyone you have been around. 3. Answer the phone call from the health department. If a public health worker calls you, answer the call to help slow the spread of COVID-19 in your community.  Discussions with health department staff are confidential. This means that your personal and medical information will be kept private and only shared with those who may need to know, like your health care provider.  Your name will not be shared with those you came in contact with. The health department will only notify people you were in close contact with (within 6 feet for more than 15 minutes) that they might have been exposed to COVID-19. Think about the people you have recently been around If you test positive and are diagnosed with COVID-19, someone from the health department may call to check-in on your health, discuss who you have been around, and ask where you  spent time while you may have been able to spread COVID-19 to others. This form can help you think about people you have recently been around so you will be ready if a public health worker calls you. Things to think about. Have you:  Gone to work or school?  Gotten together with others (eaten out at [employer], gone out for drinks, exercised with others or gone to a gym, had friends or family over to your house, volunteered, gone to a party, pool, or park)?  Gone to a store in person (e.g., grocery  store, mall)?  Gone to in-person appointments (e.g., salon, barber, doctor's or dentist's office)?  Ridden in a car with others (e.g., [employer] or [employer]) or took public transportation?  Been inside a church, synagogue, mosque or other places of worship? Who lives with you?  ______________________________________________________________________  ______________________________________________________________________  ______________________________________________________________________  ______________________________________________________________________ Who have you been around (within 6 feet for more than 15 minutes) in the last 10 days? (You may have more people to list than the space provided. If so, write on the front of this sheet or a separate piece of paper.) Name ______________________________________________  Phone number ____________________________________  Date you last saw them _____________________________  Where you last saw them ________________________________________________ Name ______________________________________________  Phone number ____________________________________  Date you last saw them _____________________________  Where you last saw them ________________________________________________ Name ______________________________________________  Phone number ____________________________________  Date you last saw them _____________________________  Where you last saw them ________________________________________________ Name ______________________________________________  Phone number ____________________________________  Date you last saw them _____________________________  Where you last saw them ________________________________________________ Name ______________________________________________  Phone number ____________________________________  Date you last saw them _____________________________  Where you last saw them  ________________________________________________ What have you done in the last 10 days with other people? Activity _____________________________________________  Location _________________________________________  Date ____________________________________________ Activity _____________________________________________  Location _________________________________________  Date ____________________________________________ Activity _____________________________________________  Location _________________________________________  Date ____________________________________________ Activity _____________________________________________  Location _________________________________________  Date ____________________________________________ Activity _____________________________________________  Location _________________________________________  Date ____________________________________________ [email or web address] 12/25/2018 This information is not intended to replace advice given to you by your health care provider. Make sure you discuss any questions you have with your health care provider. Document Revised: 05/03/2019 Document Reviewed: 05/03/2019 Elsevier Patient Education  [place].

## 2019-11-12 NOTE — Progress Notes (Signed)
Patient ID: Kimberly Frazier, female    DOB: 1960/06/04  MRN: [id number]  CC: Diabetes and Hypertension   Subjective: Kimberly Frazier is a 59 y.o. female who presents for chronic disease management.  Last seen 07/2019 Her concerns today include:  Patient with history of diabetes with neuropathy and retinopathy, HTN with hypertensive heart disease, CAD (Coronary CT revealed occlusion in the mid to distal LAD, distal RCA.Cardiology states that she will need CABG eventually),HL, obesity, endometrial CA s/p hysterectomy.  Pt does not have meds with her.   DIABETES TYPE 2 Last A1C:   Results for orders placed or performed in visit on 11/12/19  POCT glucose (manual entry)  Result Value Ref Range   POC Glucose 134 (A) 70 - 99 mg/dl  POCT glycosylated hemoglobin (Hb A1C)  Result Value Ref Range   Hemoglobin A1C     HbA1c POC (<> result, manual entry)     HbA1c, POC (prediabetic range)     HbA1c, POC (controlled diabetic range) 9.4 (A) 0.0 - 7.0 %    Med Adherence:  _0  Yes    _1  No.  Taking Lantus pen 50-54 units, Humalog suppose to be 28 units with meals but pt not sure due to poor vision.  Did not take the Victozia because she was mad that her insurance changed from [employer] to [employer].  Medication side effects:  _2  Yes    _3  No Home Monitoring?  _4  Yes but only once a wk Home glucose results range: 120-130s Diet Adherence: _5  Yes    _6  No - "not good. Sometimes I eat a lot of things I'm not suppose to eat."  Also she has been eating out a lot.  Exercise: _7  Yes    _8  No - "I'm lazy and I'm scare due to my heart problems Hypoglycemic episodes?: _9  Yes    _10  No Numbness of the feet? _11  Yes    _12  No Retinopathy hx? _13  Yes    _14  No Last eye exam:  Comments:   HYPERTENSION Currently taking: see medication list Med Adherence: _15  Yes but did not take meds as yet for the morning    Medication side effects: _16  Yes    _17  No Adherence with salt restriction: _18  Yes    _19  No Home  Monitoring?: _20  Yes    _21  No Monitoring Frequency: not often Home BP results range: _22  Yes    _23  No SOB? _24  Yes    _25  No Chest Pain?: _26  Yes    _27  No Leg swelling?: _28  Yes in ankles    _29  No Headaches?: _30  Yes -for 8-12 mths.  Occurs daily, mainly on LT side of head and around eye.  Last several hrs.  No initiating factors. Nothing makes it worse.  Better with laying down. Does not take anything for it.  +photophobia and nausea.    -had HA before but now more often.  Dizziness? _31  Yes    _32  No Comments:   C/o having cough x 1 wk. Productive of yellow/brown phlegm.  No fever, sore throat. Some loss of smell. +rhinorrhea.  No recent sick contacts.   Taking Alkaseltzer Plus. She has not received COVID vaccine as yet.  Patient Active Problem List   Diagnosis Date Noted  . Endometrial cancer ([place]) 11/28/2018  . Hematuria 11/28/2018  . 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 History of radiation therapy 11/28/2018  . Diabetes mellitus ([place]) 10/06/2018  . Type 2 diabetes mellitus with hyperglycemia, with long-term current use  of insulin ([place]) 10/06/2018  . Adrenal adenoma, left 06/15/2018  . 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 Postmenopausal vaginal bleeding 06/15/2018  . Hyperkalemia   . Diabetic peripheral neuropathy ([place]) 09/03/2016  . CAD (coronary artery disease) 04/10/2015  . Severe obesity (BMI >= 40) ([place]) 09/20/2013  . 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 Hyperlipidemia 10/25/2012  . Diabetes mellitus type 2, uncontrolled, with complications ([place]) 38/45/3646  . Essential hypertension 07/26/2012     Current Outpatient Medications on File Prior to Visit  Medication Sig Dispense Refill  . Accu-Chek Softclix Lancets lancets Use to check blood sugar 3 times daily. 100 each 6  . acetaminophen (TYLENOL) 500 MG tablet Take 1,000 mg by mouth every 6 (six) hours as needed for moderate pain or headache.    Marland Kitchen amLODipine (NORVASC) 5 MG tablet TAKE 1 TABLET (5 MG TOTAL) BY MOUTH  DAILY. 30 tablet 2  . aspirin EC 81 MG tablet Take 1 tablet (81 mg total) daily by mouth. 100 tablet 1  . atorvastatin (LIPITOR) 40 MG tablet TAKE 1 TABLET DAILY BY MOUTH. 30 tablet 2  . Blood Glucose Monitoring Suppl (ACCU-CHEK GUIDE) w/Device KIT 1 kit by Does not apply route in the morning, at noon, and at bedtime. Use to check blood sugar 3 times daily. 1 kit 0  . Bromfenac Sodium (PROLENSA) 0.07 % SOLN Place 1 drop into both eyes 4 (four) times daily. 6 mL 0  . carvedilol (COREG) 25 MG tablet Take 1 tablet (25 mg total) by mouth 2 (two) times daily with a meal. 60 tablet 0  . gabapentin (NEURONTIN) 300 MG capsule Take 1 capsule (300 mg total) by mouth at bedtime. (Patient not taking: Reported on 09/27/2019) 90 capsule 3  . glucose blood (ACCU-CHEK GUIDE) test strip Use to check blood sugar 3 times daily. 100 each 6  . insulin lispro (HUMALOG) 100 UNIT/ML injection Inject 0.28 mLs (28 Units total) into the skin 3 (three) times daily with meals. 30 mL 2  . Insulin Syringe-Needle U-100 (TRUEPLUS INSULIN SYRINGE) 31G X 5/16" 0.3 ML MISC Use to inject Humalog three times daily. 100 each 11  . isosorbide mononitrate (IMDUR) 60 MG 24 hr tablet Take 1 tablet (60 mg total) by mouth daily. 90 tablet 3  . ketorolac (ACULAR) 0.5 % ophthalmic solution Place 1 drop into both eyes 4 (four) times daily. 10 mL 5  . nitroGLYCERIN (NITROSTAT) 0.4 MG SL tablet Place 1 tab under tongue for chest pain.  May repeat after 5 minutes x 2.  DO NOT TAKE MORE THAN 3 TABS DURING AN EPISODE OF CHEST PAIN (Patient taking differently: Place 0.4 mg under the tongue every 5 (five) minutes as needed for chest pain. DO NOT TAKE MORE THAN 3 TABS DURING AN EPISODE OF CHEST PAIN) 25 tablet 6  . nystatin (MYCOSTATIN/NYSTOP) powder Apply topically 2 (two) times daily. (Patient taking differently: Apply topically as needed. ) 15 g 0  . potassium chloride (K-DUR) 10 MEQ tablet Take 2 tablets (20 mEq total) by mouth 2 (two) times daily. 120  tablet 1  . prednisoLONE acetate (PRED FORTE) 1 % ophthalmic suspension Place 1 drop into both eyes 4 (four) times daily. 15 mL 5  . TRUEPLUS PEN NEEDLES 32G X 4 MM MISC USE AS DIRECTED WITH INSULIN 100 each 0   No current facility-administered medications on file prior to visit.    No Known Allergies  Social History   Socioeconomic History  . Marital status: Single  Spouse name: Not on file  . Number of children: 2  . Years of education: 68  . Highest education level: Not on file  Occupational History  . Occupation: retired  Tobacco Use  . Smoking status: Never Smoker  . Smokeless tobacco: Never Used  Vaping Use  . Vaping Use: Never used  Substance and Sexual Activity  . Alcohol use: Not Currently  . Drug use: Not Currently    Types: Marijuana    Comment: 07-28-2018  per pt last smoked 11/21/2018  . Sexual activity: Yes  Other Topics Concern  . Not on file  Social History Narrative   Regular exercise-no   No caffeine use   Social Determinants of Health   Financial Resource Strain:   . Difficulty of Paying Living Expenses:   Food Insecurity:   . Worried About Charity fundraiser in the Last Year:   . Arboriculturist in the Last Year:   Transportation Needs:   . Film/video editor (Medical):   Marland Kitchen Lack of Transportation (Non-Medical):   Physical Activity:   . Days of Exercise per Week:   . Minutes of Exercise per Session:   Stress:   . Feeling of Stress :   Social Connections:   . Frequency of Communication with Friends and Family:   . Frequency of Social Gatherings with Friends and Family:   . Attends Religious Services:   . Active Member of Clubs or Organizations:   . Attends Archivist Meetings:   Marland Kitchen Marital Status:   Intimate Partner Violence:   . Fear of Current or Ex-Partner:   . Emotionally Abused:   Marland Kitchen Physically Abused:   . Sexually Abused:     Family History  Problem Relation Age of Onset  . Heart disease Mother   . Hypertension  Mother   . Diabetes Mother   . Cancer Father        hodgkins lymphoma  . Heart disease Sister        heart attack  . Diabetes Sister   . Cancer Other        parent  . Diabetes Other        parent  . Heart disease Other        parent  . Hyperlipidemia Other        parent  . Hypertension Other        parent  . Arthritis Other        parent  . Diabetes Maternal Aunt   . Diabetes Maternal Uncle   . Breast cancer Neg Hx     Past Surgical History:  Procedure Laterality Date  . BREAST BIOPSY  2012   benign  . CARDIAC CATHETERIZATION N/A 02/06/2015   Procedure: Left Heart Cath and Coronary Angiography;  Surgeon: Larey Dresser, MD;  Location: [facility];  Service: Cardiovascular;  Laterality: N/A;  . CATARACT EXTRACTION, BILATERAL    . HYSTEROSCOPY WITH D & C N/A 08/01/2018   Procedure: DILATATION AND CURETTAGE /HYSTEROSCOPY;  Surgeon: Mora Bellman, MD;  Location: [facility];  Service: Gynecology;  Laterality: N/A;  . ROBOTIC ASSISTED TOTAL HYSTERECTOMY WITH BILATERAL SALPINGO OOPHERECTOMY N/A 11/28/2018   Procedure: XI ROBOTIC ASSISTED TOTAL HYSTERECTOMY WITH BILATERAL SALPINGO OOPHORECTOMY;  Surgeon: Everitt Amber, MD;  Location: [facility];  Service: Gynecology;  Laterality: N/A;  . SENTINEL NODE BIOPSY N/A 11/28/2018   Procedure: SENTINEL NODE BIOPSY;  Surgeon: Everitt Amber, MD;  Location: [facility];  Service:  Gynecology;  Laterality: N/A;  . UMBILICAL HERNIA REPAIR  child    ROS: Review of Systems Negative except as stated above  PHYSICAL EXAM: BP 139/85   Pulse 85   Temp (!) 96.8 F (36 C)   Resp 16   Wt 281 lb 3.2 oz (127.6 kg)   SpO2 96%   BMI 48.27 kg/m   Wt Readings from Last 3 Encounters:  11/12/19 281 lb 3.2 oz (127.6 kg)  09/21/19 279 lb 9.6 oz (126.8 kg)  08/13/19 272 lb (123.4 kg)    Physical Exam  General appearance - alert, well appearing, and in no distress Mental status - normal mood, behavior, speech, dress, motor activity, and  thought processes Nose: Clear mucus in nostrils.  No enlargement of nasal turbinates Mouth - mucous membranes moist, pharynx normal without lesions Neck - supple, no significant adenopathy Chest - clear to auscultation, no wheezes, rales or rhonchi, symmetric air entry Heart - normal rate, regular rhythm, normal S1, S2, no murmurs, rubs, clicks or gallops Extremities - peripheral pulses normal, no pedal edema, no clubbing or cyanosis Neurologic exam: Except for poor vision or other cranial nerves are intact.  Power in both upper and lower extremities 5/5 bilaterally.  Gait is normal.  Gross sensation intact except 1 plantar surface of the feet Diabetic Foot Exam - Simple   Simple Foot Form Visual Inspection No deformities, no ulcerations, no other skin breakdown bilaterally: Yes Sensation Testing See comments: Yes Pulse Check Posterior Tibialis and Dorsalis pulse intact bilaterally: Yes Comments She has some decreased sensation on the plantar surface of both feet      CMP Latest Ref Rng & Units 09/14/2019 12/04/2018 11/29/2018  Glucose 65 - 99 mg/dL 172(H) - 265(H)  BUN 8 - 27 mg/dL 18 - 15  Creatinine 0.57 - 1.00 mg/dL 0.99 - 0.90  Sodium 134 - 144 mmol/L 143 - 136  Potassium 3.5 - 5.2 mmol/L 3.8 3.5 3.8  Chloride 96 - 106 mmol/L 104 - 102  CO2 20 - 29 mmol/L 26 - 25  Calcium 8.7 - 10.3 mg/dL 9.2 - 8.7(L)  Total Protein 6.0 - 8.5 g/dL 7.0 - -  Total Bilirubin 0.0 - 1.2 mg/dL 0.5 - -  Alkaline Phos 39 - 117 IU/L 126(H) - -  AST 0 - 40 IU/L 17 - -  ALT 0 - 32 IU/L 13 - -   Lipid Panel     Component Value Date/Time   CHOL 136 09/14/2019 0934   TRIG 113 09/14/2019 0934   HDL 40 09/14/2019 0934   CHOLHDL 3.4 09/14/2019 0934   CHOLHDL 5 07/15/2015 1513   VLDL 44.0 (H) 07/15/2015 1513   LDLCALC 75 09/14/2019 0934   LDLDIRECT 93.0 07/15/2015 1513    CBC    Component Value Date/Time   WBC 13.7 (H) 11/29/2018 0345   RBC 4.55 11/29/2018 0345   HGB 12.9 11/29/2018 0345   HGB  13.4 06/15/2018 1056   HCT 39.4 11/29/2018 0345   HCT 40.3 06/15/2018 1056   PLT 221 11/29/2018 0345   PLT 229 06/15/2018 1056   MCV 86.6 11/29/2018 0345   MCV 88 06/15/2018 1056   MCH 28.4 11/29/2018 0345   MCHC 32.7 11/29/2018 0345   RDW 14.6 11/29/2018 0345   RDW 13.6 06/15/2018 1056   LYMPHSABS 1.0 12/30/2017 0812   MONOABS 1.3 (H) 12/30/2017 0812   EOSABS 0.1 12/30/2017 0812   BASOSABS 0.0 12/30/2017 0812    ASSESSMENT AND PLAN:  1. Type 2  diabetes mellitus with other circulatory complication, with long-term current use of insulin ([place]) Not at goal.  Patient not willing to take Victoza. I will check with the pharmacist to see whether her insurance would pay for the Humalog pen since she has difficulty seeing when drawing up Humalog from a vial.  Start Wilder Glade -Discussed and encouraged healthy eating habits.  Encouraged her to cut back on eating fast foods. -Advised to check blood sugars twice a week and bring in her readings in 2 weeks to see the clinical pharmacist - POCT glucose (manual entry) - POCT glycosylated hemoglobin (Hb A1C) - insulin glargine (LANTUS SOLOSTAR) 100 UNIT/ML Solostar Pen; INJECT 55 UNITS INTO THE SKIN DAILY.  Dispense: 45 mL; Refill: 6 - dapagliflozin propanediol (FARXIGA) 5 MG TABS tablet; Take 1 tablet (5 mg total) by mouth daily before breakfast.  Dispense: 30 tablet; Refill: 2  2. Essential hypertension Not at goal.  She has not taken medicine as yet for today.  She will take them when she returns home  3. Severe obesity (BMI >= 40) ([facility]) Discussed and encouraged healthy eating habits  4. Migraine without aura and without status migrainosus, not intractable History suggest migraines.  Will get imaging study and refer to neurology.  Unable to put on Imitrex due to cardiovascular history.  We will start Fioricet instead to use as needed - CT Head Wo Contrast; Future - Ambulatory referral to Neurology - butalbital-acetaminophen-caffeine (FIORICET)  50-325-40 MG tablet; Take 1-2 tablets by mouth 2 (two) times daily as needed for headache.  Dispense: 30 tablet; Refill: 0  5. Viral respiratory illness Must rule out COVID-19 infection.  Testing done today.  Advised self quarantine until results are back.  Start her on Tessalon Perles and albuterol inhaler to use as needed. If Covid test comes back negative, I advised her to get the COVID-19 vaccine once symptoms of upper respiratory infection resolves. - benzonatate (TESSALON PERLES) 100 MG capsule; Take 1 capsule (100 mg total) by mouth 3 (three) times daily as needed for cough.  Dispense: 20 capsule; Refill: 0 - Novel Coronavirus, NAA ([facility])    Patient was given the opportunity to ask questions.  Patient verbalized understanding of the plan and was able to repeat key elements of the plan.   Orders Placed This Encounter  Procedures  . Novel Coronavirus, NAA ([facility])  . CT Head Wo Contrast  . Ambulatory referral to Neurology  . POCT glucose (manual entry)  . POCT glycosylated hemoglobin (Hb A1C)     Requested Prescriptions   Signed Prescriptions Disp Refills  . insulin glargine (LANTUS SOLOSTAR) 100 UNIT/ML Solostar Pen 45 mL 6    Sig: INJECT 55 UNITS INTO THE SKIN DAILY.  . benzonatate (TESSALON PERLES) 100 MG capsule 20 capsule 0    Sig: Take 1 capsule (100 mg total) by mouth 3 (three) times daily as needed for cough.  . butalbital-acetaminophen-caffeine (FIORICET) 50-325-40 MG tablet 30 tablet 0    Sig: Take 1-2 tablets by mouth 2 (two) times daily as needed for headache.  . dapagliflozin propanediol (FARXIGA) 5 MG TABS tablet 30 tablet 2    Sig: Take 1 tablet (5 mg total) by mouth daily before breakfast.    Return in about 3 months (around 02/12/2020) for [facility] in 2 weeks for diabetes recheck.  Karle Plumber, MD, FACP

## 2019-11-13 ENCOUNTER — Telehealth: Payer: Self-pay

## 2019-11-13 MED FILL — PROAIR HFA 90 MCG INHALER: 108 (90 BAS | 25 days supply | Qty: 9 | Fill #0

## 2019-11-13 MED FILL — FARXIGA 5 MG TABLET: 5 | 30 days supply | Qty: 30 | Fill #0

## 2019-11-13 NOTE — Telephone Encounter (Signed)
Prior authorization for Wilder Glade is approved thru pt's Medicaid thru 11/07/20

## 2019-11-14 LAB — NOVEL CORONAVIRUS, NAA: SARS-CoV-2, NAA: NOT DETECTED

## 2019-11-14 LAB — SARS-COV-2, NAA 2 DAY TAT

## 2019-11-16 ENCOUNTER — Telehealth: Payer: Self-pay

## 2019-11-16 NOTE — Telephone Encounter (Signed)
Contacted pt to go over lab results pt is aware and doesn't have any questions or concerns 

## 2019-11-21 ENCOUNTER — Encounter (INDEPENDENT_AMBULATORY_CARE_PROVIDER_SITE_OTHER): Payer: Medicare Other | Admitting: Ophthalmology

## 2019-11-26 ENCOUNTER — Ambulatory Visit (HOSPITAL_COMMUNITY)
Admission: RE | Admit: 2019-11-26 | Discharge: 2019-11-26 | Disposition: A | Discharge status: Home / Self Care | Payer: Medicare Other | Source: Ambulatory Visit | Attending: Internal Medicine | Admitting: Internal Medicine

## 2019-11-26 ENCOUNTER — Other Ambulatory Visit: Payer: Self-pay

## 2019-11-26 DIAGNOSIS — G43009 Migraine without aura, not intractable, without status migrainosus: Secondary | ICD-10-CM | POA: Insufficient documentation

## 2019-11-28 ENCOUNTER — Telehealth: Payer: Self-pay

## 2019-11-28 ENCOUNTER — Telehealth: Payer: Self-pay | Admitting: Internal Medicine

## 2019-11-28 DIAGNOSIS — R9389 Abnormal findings on diagnostic imaging of other specified body structures: Secondary | ICD-10-CM

## 2019-11-28 DIAGNOSIS — G43009 Migraine without aura, not intractable, without status migrainosus: Secondary | ICD-10-CM

## 2019-11-28 NOTE — Telephone Encounter (Signed)
Spoke with radiology scheduling requested new blood work

## 2019-11-28 NOTE — Telephone Encounter (Signed)
Called Pt made aware of scheduled MRI at [facility] 11/29/2019 at [place] pt to arrive at 7:30Pm/ Instructions were provided/ Verbalized understanding

## 2019-11-28 NOTE — Telephone Encounter (Signed)
Called pt / Name and DOB verified .Made pt aware of results and MD/NP results note. Verbalized understanding/ Stated she had no trauma or injury to the head over the past weeks. Discussed about MRI scheduling/

## 2019-11-28 NOTE — Telephone Encounter (Signed)
Phone call placed to patient today to give the results of the CAT scan of the head.  I left a message on her voicemail informing of who I am, my reason for calling.  And that she can call the office today to speak with my nurse to get these results.  I will also have the nurse try to reach out to her later. If patient calls back let her know that the CAT scan of the head showed an abnormal small area in the left frontal aspect of the brain.  The radiologist states that it could be a small area of hemorrhage or brain lesion/growth.  We will need to do additional imaging to evaluate this further.  I have put in the order for an MRI of the brain.

## 2019-11-29 ENCOUNTER — Ambulatory Visit (HOSPITAL_COMMUNITY)
Admission: RE | Admit: 2019-11-29 | Discharge: 2019-11-29 | Disposition: A | Discharge status: Home / Self Care | Payer: Medicare Other | Source: Ambulatory Visit | Attending: Internal Medicine | Admitting: Internal Medicine

## 2019-11-29 DIAGNOSIS — G43009 Migraine without aura, not intractable, without status migrainosus: Secondary | ICD-10-CM | POA: Diagnosis present

## 2019-11-29 DIAGNOSIS — R9389 Abnormal findings on diagnostic imaging of other specified body structures: Secondary | ICD-10-CM | POA: Insufficient documentation

## 2019-11-29 MED ORDER — GADOBUTROL 1 MMOL/ML IV SOLN
10.0000 mL | Freq: Once | INTRAVENOUS | Status: AC | PRN
  Administered 2019-11-29: 10 mL via INTRAVENOUS

## 2019-12-01 ENCOUNTER — Telehealth: Payer: Self-pay | Admitting: Internal Medicine

## 2019-12-01 NOTE — Telephone Encounter (Signed)
Phone call placed to patient today to discuss results of MRI of the head.  I left a message on her voicemail informing her that the MRI of the head did not show any strokes or hemorrhage.  It did show some changes that suggest that she is at risk for stroke.  I recommend taking her aspirin and atorvastatin every day.  I have referred her to the neurologist for further evaluation of her headaches.

## 2019-12-05 ENCOUNTER — Telehealth: Payer: Self-pay | Admitting: Internal Medicine

## 2019-12-05 NOTE — Telephone Encounter (Signed)
Returned pt call and went over Dr. Wynetta Emery telephone note from 7/3 regarding MRI results. Pt is aware of results and is scheduled to see neurologist on the 23rd. Pt doesn't have any questions or concerns

## 2019-12-05 NOTE — Telephone Encounter (Signed)
Please advise.   Copied from [place] [phone]. Topic: General - Other >> Dec 05, 2019 10:07 AM Kimberly Frazier wrote: Reason for CRM: Pt would like to speak with nurse or Dr. Wynetta Emery about her MRI/ please advise

## 2019-12-06 MED FILL — LANTUS SOLOSTAR 100 UNITS/M: 100 | 27 days supply | Qty: 15 | Fill #1

## 2019-12-10 MED FILL — HumaLOG 100 UNIT/ML SOLN: 100 | 35 days supply | Qty: 30 | Fill #1

## 2019-12-10 NOTE — Telephone Encounter (Signed)
Please Advise   Copied from [place] [phone]. Topic: General - Other >> Dec 10, 2019 10:42 AM Rainey Pines A wrote: Patient is requesting a callback from Dr. Bard Herbert nurse in regards to wanting a different medication called in for her. Patient couldn't recall the names of the medications but stated her "inhaler and pills" are not working and she is requesting antibiotics called into pharmacy. Please advise

## 2019-12-10 NOTE — Telephone Encounter (Signed)
Will forward to pcp

## 2019-12-11 MED ORDER — AZITHROMYCIN 250 MG PO TABS
ORAL_TABLET | ORAL | 0 refills | Status: DC
Start: 2019-12-11 — End: 2019-12-21

## 2019-12-11 MED FILL — AZITHROMYCIN 250 MG TABLET: 250 | 5 days supply | Qty: 6 | Fill #0

## 2019-12-12 ENCOUNTER — Ambulatory Visit (INDEPENDENT_AMBULATORY_CARE_PROVIDER_SITE_OTHER): Payer: Medicare Other | Admitting: Cardiology

## 2019-12-12 ENCOUNTER — Encounter: Payer: Self-pay | Admitting: Cardiology

## 2019-12-12 ENCOUNTER — Other Ambulatory Visit: Payer: Self-pay

## 2019-12-12 VITALS — BP 130/78 | HR 87 | Temp 93.9°F | Ht 64.0 in | Wt 286.0 lb

## 2019-12-12 DIAGNOSIS — Z7182 Exercise counseling: Secondary | ICD-10-CM

## 2019-12-12 DIAGNOSIS — I25118 Atherosclerotic heart disease of native coronary artery with other forms of angina pectoris: Secondary | ICD-10-CM

## 2019-12-12 DIAGNOSIS — E7800 Pure hypercholesterolemia, unspecified: Secondary | ICD-10-CM

## 2019-12-12 DIAGNOSIS — I2510 Atherosclerotic heart disease of native coronary artery without angina pectoris: Secondary | ICD-10-CM | POA: Diagnosis not present

## 2019-12-12 DIAGNOSIS — I10 Essential (primary) hypertension: Secondary | ICD-10-CM

## 2019-12-12 DIAGNOSIS — Z79899 Other long term (current) drug therapy: Secondary | ICD-10-CM

## 2019-12-12 DIAGNOSIS — E669 Obesity, unspecified: Secondary | ICD-10-CM

## 2019-12-12 DIAGNOSIS — Z713 Dietary counseling and surveillance: Secondary | ICD-10-CM

## 2019-12-12 DIAGNOSIS — E1169 Type 2 diabetes mellitus with other specified complication: Secondary | ICD-10-CM

## 2019-12-12 NOTE — Patient Instructions (Addendum)
Medication Instructions:  Your Physician recommend you continue on your current medication as directed.    *If you need a refill on your cardiac medications before your next appointment, please call your pharmacy*   Lab Work: Your physician recommends that you return for lab work 1 week prior to 3 month follow up appointment ( Fasting Lipid).    Testing/Procedures: None   Follow-Up: At [facility], you and your health needs are our priority.  As part of our continuing mission to provide you with exceptional heart care, we have created designated Provider Care Teams.  These Care Teams include your primary Cardiologist (physician) and Advanced Practice Providers (APPs -  Physician Assistants and Nurse Practitioners) who all work together to provide you with the care you need, when you need it.  We recommend signing up for the patient portal called "MyChart".  Sign up information is provided on this After Visit Summary.  MyChart is used to connect with patients for Virtual Visits (Telemedicine).  Patients are able to view lab/test results, encounter notes, upcoming appointments, etc.  Non-urgent messages can be sent to your provider as well.   To learn more about what you can do with MyChart, go to [email or web address].    Your next appointment:   3 month(s)  The format for your next appointment:   In Person  Provider:   Buford Dresser, MD   Other Instructions -recommend heart healthy/Mediterranean diet, with whole grains, fruits, vegetable, fish, lean meats, nuts, and olive oil. Limit salt. -recommend moderate walking, 3-5 times/week for 30-50 minutes each session. Aim for at least 150 minutes.week. Goal should be pace of 3 miles/hours, or walking 1.5 miles in 30 minutes -recommend avoidance of tobacco products. Avoid excess alcohol.   Mediterranean Diet A Mediterranean diet refers to food and lifestyle choices that are based on the traditions of countries located on  the [employer]. This way of eating has been shown to help prevent certain conditions and improve outcomes for people who have chronic diseases, like kidney disease and heart disease. What are tips for following this plan? Lifestyle  Cook and eat meals together with your family, when possible.  Drink enough fluid to keep your urine clear or pale yellow.  Be physically active every day. This includes: ? Aerobic exercise like running or swimming. ? Leisure activities like gardening, walking, or housework.  Get 7-8 hours of sleep each night.  If recommended by your health care provider, drink red wine in moderation. This means 1 glass a day for nonpregnant women and 2 glasses a day for men. A glass of wine equals 5 oz (150 mL). Reading food labels   Check the serving size of packaged foods. For foods such as rice and pasta, the serving size refers to the amount of cooked product, not dry.  Check the total fat in packaged foods. Avoid foods that have saturated fat or trans fats.  Check the ingredients list for added sugars, such as corn syrup. Shopping  At the grocery store, buy most of your food from the areas near the walls of the store. This includes: ? Fresh fruits and vegetables (produce). ? Grains, beans, nuts, and seeds. Some of these may be available in unpackaged forms or large amounts (in bulk). ? Fresh seafood. ? Poultry and eggs. ? Low-fat dairy products.  Buy whole ingredients instead of prepackaged foods.  Buy fresh fruits and vegetables in-season from local farmers markets.  Buy frozen fruits and vegetables in resealable bags.  If  you do not have access to quality fresh seafood, buy precooked frozen shrimp or canned fish, such as tuna, salmon, or sardines.  Buy small amounts of raw or cooked vegetables, salads, or olives from the deli or salad bar at your store.  Stock your pantry so you always have certain foods on hand, such as olive oil, canned tuna,  canned tomatoes, rice, pasta, and beans. Cooking  Cook foods with extra-virgin olive oil instead of using butter or other vegetable oils.  Have meat as a side dish, and have vegetables or grains as your main dish. This means having meat in small portions or adding small amounts of meat to foods like pasta or stew.  Use beans or vegetables instead of meat in common dishes like chili or lasagna.  Experiment with different cooking methods. Try roasting or broiling vegetables instead of steaming or sauteing them.  Add frozen vegetables to soups, stews, pasta, or rice.  Add nuts or seeds for added healthy fat at each meal. You can add these to yogurt, salads, or vegetable dishes.  Marinate fish or vegetables using olive oil, lemon juice, garlic, and fresh herbs. Meal planning   Plan to eat 1 vegetarian meal one day each week. Try to work up to 2 vegetarian meals, if possible.  Eat seafood 2 or more times a week.  Have healthy snacks readily available, such as: ? Vegetable sticks with hummus. ? [place] yogurt. ? Fruit and nut trail mix.  Eat balanced meals throughout the week. This includes: ? Fruit: 2-3 servings a day ? Vegetables: 4-5 servings a day ? Low-fat dairy: 2 servings a day ? Fish, poultry, or lean meat: 1 serving a day ? Beans and legumes: 2 or more servings a week ? Nuts and seeds: 1-2 servings a day ? Whole grains: 6-8 servings a day ? Extra-virgin olive oil: 3-4 servings a day  Limit red meat and sweets to only a few servings a month What are my food choices?  Mediterranean diet ? Recommended  Grains: Whole-grain pasta. Brown rice. Bulgar wheat. Polenta. Couscous. Whole-wheat bread. [place].  Vegetables: Artichokes. Beets. Broccoli. Cabbage. Carrots. Eggplant. Green beans. Chard. Kale. Spinach. Onions. Leeks. Peas. Squash. Tomatoes. Peppers. Radishes.  Fruits: Apples. Apricots. Avocado. Berries. Bananas. Cherries. Dates. Figs. Grapes. Lemons. Melon.  Oranges. Peaches. Plums. Pomegranate.  Meats and other protein foods: Beans. Almonds. Sunflower seeds. Pine nuts. Peanuts. [place]. Salmon. Scallops. Shrimp. [place]. Tilapia. Clams. Oysters. Eggs.  Dairy: Low-fat milk. Cheese. Greek yogurt.  Beverages: Water. Red wine. Herbal tea.  Fats and oils: Extra virgin olive oil. Avocado oil. Grape seed oil.  Sweets and desserts: [place] yogurt with honey. Baked apples. Poached pears. Trail mix.  Seasoning and other foods: Basil. Cilantro. Coriander. Cumin. Mint. Parsley. Sage. Rosemary. Tarragon. Garlic. Oregano. Thyme. Pepper. Balsalmic vinegar. Tahini. Hummus. Tomato sauce. Olives. Mushrooms. ? Limit these  Grains: Prepackaged pasta or rice dishes. Prepackaged cereal with added sugar.  Vegetables: Deep fried potatoes (french fries).  Fruits: Fruit canned in syrup.  Meats and other protein foods: Beef. Pork. Lamb. Poultry with skin. Hot dogs. [place] Salines.  Dairy: Ice cream. Sour cream. Whole milk.  Beverages: Juice. Sugar-sweetened soft drinks. Beer. Liquor and spirits.  Fats and oils: Butter. Canola oil. Vegetable oil. Beef fat (tallow). Lard.  Sweets and desserts: Cookies. Cakes. Pies. Candy.  Seasoning and other foods: Mayonnaise. Premade sauces and marinades. The items listed may not be a complete list. Talk with your dietitian about what dietary choices are right for you.  Summary  The Mediterranean diet includes both food and lifestyle choices.  Eat a variety of fresh fruits and vegetables, beans, nuts, seeds, and whole grains.  Limit the amount of red meat and sweets that you eat.  Talk with your health care provider about whether it is safe for you to drink red wine in moderation. This means 1 glass a day for nonpregnant women and 2 glasses a day for men. A glass of wine equals 5 oz (150 mL). This information is not intended to replace advice given to you by your health care provider. Make sure you discuss any questions you have with your  health care provider. Document Revised: 01/15/2016 Document Reviewed: 01/08/2016 Elsevier Patient Education  [place].

## 2019-12-12 NOTE — Telephone Encounter (Signed)
As instructed by NP/MD called pt/ name and DOB verified/ made aware of results and MD/NP results note. Verbalized understanding

## 2019-12-12 NOTE — Progress Notes (Signed)
Cardiology Office Note:    Date:  12/12/2019   ID:  [name], DOB [birth year], MRN [id number]  PCP:  Ladell Pier, MD  Cardiologist:  Buford Dresser, MD  Referring MD: Ladell Pier, MD   CC: follow up  History of Present Illness:    Kimberly Frazier is a 59 y.o. female with a hx of CAD, hypertension, hypercholesterolemia, type II diabetes on insulin, obesity who is seen for follow up.  Today: Struggling with joint pain, dizziness. Has tired spells. No more chest tightness like before, had a cold recently that seems to have progressed to bronchitis. Pending antibiotic for this, picking up today.   Discussed the results of her CTA today. Reviewed anatomic results with her today, recommendations for management.   Discussed secondary prevention today. Tolerating aspirin and atorvastatin. LDL was 75 08/2019. Discussed options of increasing statin dose vs. Diet and lifestyle. She wants to work on lifestyle before intensifying statin.  On dapagliflozin, Would like to be on 10 mg dose for cardiac benefit, but as she is also on insulin, will defer changing dosing to Dr. Wynetta Emery as this may also affect her insulin dosing.   Denies chest pain. No PND, orthopnea, LE edema or unexpected weight gain. No syncope or palpitations.  Past Medical History:  Diagnosis Date  . Adrenal adenoma, left   . Arthritis   . Cataract    Bilateral  . CKD (chronic kidney disease), stage II   . Coronary artery disease 07-28-2018 [facility]  currently due to no insurance   per cardiac cath 02-06-2015 (positive mild lateral ishcemia on stress test)--- dLAD 80%,  mLAD 40%,  mPDA 80% (small vessel),  ostial D1 70%,  CFx with lumial irregarlities-- medical management  . Depression   . Diabetic neuropathy ([place])   . History of Bell's palsy 07/2011   per pt residual facial pain on left side occasionally  . History of cancer of vagina 1999   per pt completed radiation and  chemo  . History of sepsis 12/30/2017   positive blood culter fro E.coli  . Hyperlipidemia   . Hypertension   . Hypokalemia   . Insulin dependent type 2 diabetes mellitus, uncontrolled ([place])    followed by pcp---  A1c was 11.7 on 06-15-2018 in epic  . Myocardial infarction ([place])   . Nocturia   . Peripheral neuropathy   . PMB (postmenopausal bleeding)   . Wears dentures    upper  . Wears glasses     Past Surgical History:  Procedure Laterality Date  . BREAST BIOPSY  2012   benign  . CARDIAC CATHETERIZATION N/A 02/06/2015   Procedure: Left Heart Cath and Coronary Angiography;  Surgeon: Larey Dresser, MD;  Location: [facility];  Service: Cardiovascular;  Laterality: N/A;  . CATARACT EXTRACTION, BILATERAL    . HYSTEROSCOPY WITH D & C N/A 08/01/2018   Procedure: DILATATION AND CURETTAGE /HYSTEROSCOPY;  Surgeon: Mora Bellman, MD;  Location: [facility];  Service: Gynecology;  Laterality: N/A;  . ROBOTIC ASSISTED TOTAL HYSTERECTOMY WITH BILATERAL SALPINGO OOPHERECTOMY N/A 11/28/2018   Procedure: XI ROBOTIC ASSISTED TOTAL HYSTERECTOMY WITH BILATERAL SALPINGO OOPHORECTOMY;  Surgeon: Everitt Amber, MD;  Location: [facility];  Service: Gynecology;  Laterality: N/A;  . SENTINEL NODE BIOPSY N/A 11/28/2018   Procedure: SENTINEL NODE BIOPSY;  Surgeon: Everitt Amber, MD;  Location: [facility];  Service: Gynecology;  Laterality: N/A;  . UMBILICAL HERNIA REPAIR  child    Current Medications:  Current Outpatient Medications on File Prior to Visit  Medication Sig  . Accu-Chek Softclix Lancets lancets Use to check blood sugar 3 times daily.  Marland Kitchen acetaminophen (TYLENOL) 500 MG tablet Take 1,000 mg by mouth every 6 (six) hours as needed for moderate pain or headache.  . albuterol (VENTOLIN HFA) 108 (90 Base) MCG/ACT inhaler Inhale 2 puffs into the lungs every 6 (six) hours as needed for wheezing or shortness of breath.  Marland Kitchen amLODipine (NORVASC) 5 MG tablet TAKE 1 TABLET (5 MG TOTAL) BY MOUTH  DAILY.  Marland Kitchen aspirin EC 81 MG tablet Take 1 tablet (81 mg total) daily by mouth.  Marland Kitchen atorvastatin (LIPITOR) 40 MG tablet TAKE 1 TABLET DAILY BY MOUTH.  Marland Kitchen azithromycin (ZITHROMAX Z-PAK) 250 MG tablet 2 tabs Po x 1 than 1 tab PO daily.  . benzonatate (TESSALON PERLES) 100 MG capsule Take 1 capsule (100 mg total) by mouth 3 (three) times daily as needed for cough.  . Blood Glucose Monitoring Suppl (ACCU-CHEK GUIDE) w/Device KIT 1 kit by Does not apply route in the morning, at noon, and at bedtime. Use to check blood sugar 3 times daily.  . Bromfenac Sodium (PROLENSA) 0.07 % SOLN Place 1 drop into both eyes 4 (four) times daily.  . butalbital-acetaminophen-caffeine (FIORICET) 50-325-40 MG tablet Take 1-2 tablets by mouth 2 (two) times daily as needed for headache.  . carvedilol (COREG) 25 MG tablet Take 1 tablet (25 mg total) by mouth 2 (two) times daily with a meal.  . dapagliflozin propanediol (FARXIGA) 5 MG TABS tablet Take 1 tablet (5 mg total) by mouth daily before breakfast.  . gabapentin (NEURONTIN) 300 MG capsule Take 1 capsule (300 mg total) by mouth at bedtime. (Patient not taking: Reported on 09/27/2019)  . glucose blood (ACCU-CHEK GUIDE) test strip Use to check blood sugar 3 times daily.  . insulin glargine (LANTUS SOLOSTAR) 100 UNIT/ML Solostar Pen INJECT 55 UNITS INTO THE SKIN DAILY.  Marland Kitchen insulin lispro (HUMALOG) 100 UNIT/ML injection Inject 0.28 mLs (28 Units total) into the skin 3 (three) times daily with meals.  . Insulin Syringe-Needle U-100 (TRUEPLUS INSULIN SYRINGE) 31G X 5/16" 0.3 ML MISC Use to inject Humalog three times daily.  . isosorbide mononitrate (IMDUR) 60 MG 24 hr tablet Take 1 tablet (60 mg total) by mouth daily.  Marland Kitchen ketorolac (ACULAR) 0.5 % ophthalmic solution Place 1 drop into both eyes 4 (four) times daily.  . nitroGLYCERIN (NITROSTAT) 0.4 MG SL tablet Place 1 tab under tongue for chest pain.  May repeat after 5 minutes x 2.  DO NOT TAKE MORE THAN 3 TABS DURING AN EPISODE OF  CHEST PAIN (Patient taking differently: Place 0.4 mg under the tongue every 5 (five) minutes as needed for chest pain. DO NOT TAKE MORE THAN 3 TABS DURING AN EPISODE OF CHEST PAIN)  . nystatin (MYCOSTATIN/NYSTOP) powder Apply topically 2 (two) times daily. (Patient taking differently: Apply topically as needed. )  . potassium chloride (K-DUR) 10 MEQ tablet Take 2 tablets (20 mEq total) by mouth 2 (two) times daily.  . prednisoLONE acetate (PRED FORTE) 1 % ophthalmic suspension Place 1 drop into both eyes 4 (four) times daily.  . TRUEPLUS PEN NEEDLES 32G X 4 MM MISC USE AS DIRECTED WITH INSULIN   No current facility-administered medications on file prior to visit.     Allergies:   Patient has no known allergies.   Social History   Tobacco Use  . Smoking status: Never Smoker  . Smokeless tobacco: Never  Used  Vaping Use  . Vaping Use: Never used  Substance Use Topics  . Alcohol use: Not Currently  . Drug use: Not Currently    Types: Marijuana    Comment: 07-28-2018  per pt last smoked 11/21/2018    Family History: family history includes Arthritis in an other family member; Cancer in her father and another family member; Diabetes in her maternal aunt, maternal uncle, mother, sister, and another family member; Heart disease in her mother, sister, and another family member; Hyperlipidemia in an other family member; Hypertension in her mother and another family member. There is no history of Breast cancer.  ROS:   Please see the history of present illness.  Additional pertinent ROS otherwise unremarkable  EKGs/Labs/Other Studies Reviewed:    The following studies were reviewed today: CT coronary 09/29/19 Coronary calcium score: The patient's coronary artery calcium score is 350, which places the patient in the 97th percentile.  Coronary arteries: Normal coronary origins.  Right dominance.  Right Coronary Artery: Dominant. Mild 25-49% non-calcified plaque of the mid-vessel  (CADRADS2).  Left Main Coronary Artery: Eccentric calcification with minimal 1-24% mixed stenosis (CADRADS1). Bifurcates into the LAD and LCx vessels.  Left Anterior Descending Coronary Artery: The LAD does not reach the apex and may be occluded in the distal portion. There is moderate 50-69% (CADRADS3) mid-vessel non-calcified stenosis.  Left Circumflex Artery: Minimal distal 1-24% diffuse non-calcified stenosis. Smaller distal OM1 branch has calcified plaque with mild to moderate stenosis.  Aorta: Normal size, 37 mm at the mid ascending aorta (level of the PA bifurcation) measured double oblique. No calcifications. No dissection.  Aortic Valve: Trileaflet.  No calcifications.  Other findings:  Normal pulmonary vein drainage into the left atrium.  Normal left atrial appendage without a thrombus.  Dilated main pulmonary artery measuring 32 mm, suggestive of pulmonary hypertension.  Moderate sized circumferential pericardial effusion.  IMPRESSION: 1. Moderate non-calcified mid-LAD stenosis, possible distal LAD occlusion, CADRADS = 3. CT FFR will be performed and reported separately.  2. Coronary calcium score of 350. This was 97th percentile for age and sex matched control.  3. Normal coronary origin with right dominance.  4.  Moderate-sized circumferential pericardial effusion  5. Dilated main pulmonary artery to 32 mm, suggestive of pulmonary hypertension  6.  See radiology findings regarding lung parenchymal changes  EKG:  EKG is personally reviewed.  The ekg ordered today demonstrates normal sinus rhythm with poor R wave progression, 87 bpm  Recent Labs: 09/14/2019: ALT 13; BUN 18; Creatinine, Ser 0.99; Potassium 3.8; Sodium 143  Recent Lipid Panel    Component Value Date/Time   CHOL 136 09/14/2019 0934   TRIG 113 09/14/2019 0934   HDL 40 09/14/2019 0934   CHOLHDL 3.4 09/14/2019 0934   CHOLHDL 5 07/15/2015 1513   VLDL 44.0 (H) 07/15/2015  1513   LDLCALC 75 09/14/2019 0934   LDLDIRECT 93.0 07/15/2015 1513    Physical Exam:    VS:  BP 130/78   Pulse 87   Temp (!) 93.9 F (34.4 C)   Ht _0  (1.626 m)   Wt 286 lb (129.7 kg)   SpO2 96%   BMI 49.09 kg/m     Wt Readings from Last 3 Encounters:  12/12/19 286 lb (129.7 kg)  11/12/19 281 lb 3.2 oz (127.6 kg)  09/21/19 279 lb 9.6 oz (126.8 kg)    GEN: Well nourished, well developed in no acute distress HEENT: Normal, moist mucous membranes NECK: No JVD CARDIAC: regular rhythm, normal  S1 and S2, no rubs or gallops. No murmur. VASCULAR: Radial and DP pulses 2+ bilaterally. No carotid bruits RESPIRATORY:  Clear to auscultation without rales, wheezing or rhonchi  ABDOMEN: Soft, non-tender, non-distended MUSCULOSKELETAL:  Ambulates independently SKIN: Warm and dry, no edema NEUROLOGIC:  Alert and oriented x 3. No focal neuro deficits noted. PSYCHIATRIC:  Normal affect   ASSESSMENT:    1. Coronary artery disease of native artery of native heart with stable angina pectoris ([place])   2. Diabetes mellitus type 2 in obese ([place])   3. Essential hypertension   4. Pure hypercholesterolemia   5. Medication management   6. Nutritional counseling   7. Exercise counseling    PLAN:    CAD: with stable angina rarely -see CT as above -continue amlodipine, imdur for BP and microvascular circulation -has nitroglycerin, given instructions on use -continue aspirin 81 mg daily -continue carvedilol 25 mg BID -instructed on red flag warning signs that need immediate medical attention -discussed avoidance of NSAIDs, danger of MI with CAD. -reviewed exercise and nutrition recommendations, weight loss goals  -ECG without acute changes today  Type II diabetes: -last A1c 9.4 -on dapagliflozin 5 mg. For CV protection, would like 10 mg dose, but as she is on insulin will defer to Dr. Wynetta Emery  Hypercholesterolemia: -Continue atorvastatin 40 mg daily -last LDL 75 08/2019. Discussed  increasing statin. She would like to work on lifestyle first -recheck lipids before next visit  Hypertension: just at goal of <130/80 -continue amlodipine, imdur for BP and microvascular circulation -continue carvedilol 25 mg BID  Cardiac risk counseling and prevention recommendations: -recommend heart healthy/Mediterranean diet, with whole grains, fruits, vegetable, fish, lean meats, nuts, and olive oil. Limit salt. -recommend moderate walking, 3-5 times/week for 30-50 minutes each session. Aim for at least 150 minutes.week. Goal should be pace of 3 miles/hours, or walking 1.5 miles in 30 minutes -recommend avoidance of tobacco products. Avoid excess alcohol. -ASCVD risk score: The 10-year ASCVD risk score Mikey Bussing DC Brooke Bonito., et al., 2013) is: 14.3%   Values used to calculate the score:     Age: 18 years     Sex: Female     Is Non-Hispanic African American: Yes     Diabetic: Yes     Tobacco smoker: No     Systolic Blood Pressure: 366 mmHg     Is BP treated: Yes     HDL Cholesterol: 40 mg/dL     Total Cholesterol: 136 mg/dL    Plan for follow up: 3 mos  Buford Dresser, MD, PhD [place]  [facility]    Medication Adjustments/Labs and Tests Ordered: Current medicines are reviewed at length with the patient today.  Concerns regarding medicines are outlined above.  Orders Placed This Encounter  Procedures  . Lipid panel  . EKG 12-Lead   No orders of the defined types were placed in this encounter.   Patient Instructions  Medication Instructions:  Your Physician recommend you continue on your current medication as directed.    *If you need a refill on your cardiac medications before your next appointment, please call your pharmacy*   Lab Work: Your physician recommends that you return for lab work 1 week prior to 3 month follow up appointment ( Fasting Lipid).    Testing/Procedures: None   Follow-Up: At [facility], you and your health needs are our  priority.  As part of our continuing mission to provide you with exceptional heart care, we have created designated Provider Care Teams.  These Care Teams include your primary Cardiologist (physician) and Advanced Practice Providers (APPs -  Physician Assistants and Nurse Practitioners) who all work together to provide you with the care you need, when you need it.  We recommend signing up for the patient portal called "MyChart".  Sign up information is provided on this After Visit Summary.  MyChart is used to connect with patients for Virtual Visits (Telemedicine).  Patients are able to view lab/test results, encounter notes, upcoming appointments, etc.  Non-urgent messages can be sent to your provider as well.   To learn more about what you can do with MyChart, go to [email or web address].    Your next appointment:   3 month(s)  The format for your next appointment:   In Person  Provider:   Buford Dresser, MD   Other Instructions -recommend heart healthy/Mediterranean diet, with whole grains, fruits, vegetable, fish, lean meats, nuts, and olive oil. Limit salt. -recommend moderate walking, 3-5 times/week for 30-50 minutes each session. Aim for at least 150 minutes.week. Goal should be pace of 3 miles/hours, or walking 1.5 miles in 30 minutes -recommend avoidance of tobacco products. Avoid excess alcohol.   Mediterranean Diet A Mediterranean diet refers to food and lifestyle choices that are based on the traditions of countries located on the [employer]. This way of eating has been shown to help prevent certain conditions and improve outcomes for people who have chronic diseases, like kidney disease and heart disease. What are tips for following this plan? Lifestyle  Cook and eat meals together with your family, when possible.  Drink enough fluid to keep your urine clear or pale yellow.  Be physically active every day. This includes: ? Aerobic exercise like running or  swimming. ? Leisure activities like gardening, walking, or housework.  Get 7-8 hours of sleep each night.  If recommended by your health care provider, drink red wine in moderation. This means 1 glass a day for nonpregnant women and 2 glasses a day for men. A glass of wine equals 5 oz (150 mL). Reading food labels   Check the serving size of packaged foods. For foods such as rice and pasta, the serving size refers to the amount of cooked product, not dry.  Check the total fat in packaged foods. Avoid foods that have saturated fat or trans fats.  Check the ingredients list for added sugars, such as corn syrup. Shopping  At the grocery store, buy most of your food from the areas near the walls of the store. This includes: ? Fresh fruits and vegetables (produce). ? Grains, beans, nuts, and seeds. Some of these may be available in unpackaged forms or large amounts (in bulk). ? Fresh seafood. ? Poultry and eggs. ? Low-fat dairy products.  Buy whole ingredients instead of prepackaged foods.  Buy fresh fruits and vegetables in-season from local farmers markets.  Buy frozen fruits and vegetables in resealable bags.  If you do not have access to quality fresh seafood, buy precooked frozen shrimp or canned fish, such as tuna, salmon, or sardines.  Buy small amounts of raw or cooked vegetables, salads, or olives from the deli or salad bar at your store.  Stock your pantry so you always have certain foods on hand, such as olive oil, canned tuna, canned tomatoes, rice, pasta, and beans. Cooking  Cook foods with extra-virgin olive oil instead of using butter or other vegetable oils.  Have meat as a side dish, and have vegetables or grains as your main dish. This  means having meat in small portions or adding small amounts of meat to foods like pasta or stew.  Use beans or vegetables instead of meat in common dishes like chili or lasagna.  Experiment with different cooking methods. Try  roasting or broiling vegetables instead of steaming or sauteing them.  Add frozen vegetables to soups, stews, pasta, or rice.  Add nuts or seeds for added healthy fat at each meal. You can add these to yogurt, salads, or vegetable dishes.  Marinate fish or vegetables using olive oil, lemon juice, garlic, and fresh herbs. Meal planning   Plan to eat 1 vegetarian meal one day each week. Try to work up to 2 vegetarian meals, if possible.  Eat seafood 2 or more times a week.  Have healthy snacks readily available, such as: ? Vegetable sticks with hummus. ? [place] yogurt. ? Fruit and nut trail mix.  Eat balanced meals throughout the week. This includes: ? Fruit: 2-3 servings a day ? Vegetables: 4-5 servings a day ? Low-fat dairy: 2 servings a day ? Fish, poultry, or lean meat: 1 serving a day ? Beans and legumes: 2 or more servings a week ? Nuts and seeds: 1-2 servings a day ? Whole grains: 6-8 servings a day ? Extra-virgin olive oil: 3-4 servings a day  Limit red meat and sweets to only a few servings a month What are my food choices?  Mediterranean diet ? Recommended  Grains: Whole-grain pasta. Brown rice. Bulgar wheat. Polenta. Couscous. Whole-wheat bread. [place].  Vegetables: Artichokes. Beets. Broccoli. Cabbage. Carrots. Eggplant. Green beans. Chard. Kale. Spinach. Onions. Leeks. Peas. Squash. Tomatoes. Peppers. Radishes.  Fruits: Apples. Apricots. Avocado. Berries. Bananas. Cherries. Dates. Figs. Grapes. Lemons. Melon. Oranges. Peaches. Plums. Pomegranate.  Meats and other protein foods: Beans. Almonds. Sunflower seeds. Pine nuts. Peanuts. [place]. Salmon. Scallops. Shrimp. [place]. Tilapia. Clams. Oysters. Eggs.  Dairy: Low-fat milk. Cheese. Greek yogurt.  Beverages: Water. Red wine. Herbal tea.  Fats and oils: Extra virgin olive oil. Avocado oil. Grape seed oil.  Sweets and desserts: [place] yogurt with honey. Baked apples. Poached pears. Trail mix.  Seasoning  and other foods: Basil. Cilantro. Coriander. Cumin. Mint. Parsley. Sage. Rosemary. Tarragon. Garlic. Oregano. Thyme. Pepper. Balsalmic vinegar. Tahini. Hummus. Tomato sauce. Olives. Mushrooms. ? Limit these  Grains: Prepackaged pasta or rice dishes. Prepackaged cereal with added sugar.  Vegetables: Deep fried potatoes (french fries).  Fruits: Fruit canned in syrup.  Meats and other protein foods: Beef. Pork. Lamb. Poultry with skin. Hot dogs. Berniece Salines.  Dairy: Ice cream. Sour cream. Whole milk.  Beverages: Juice. Sugar-sweetened soft drinks. Beer. Liquor and spirits.  Fats and oils: Butter. Canola oil. Vegetable oil. Beef fat (tallow). Lard.  Sweets and desserts: Cookies. Cakes. Pies. Candy.  Seasoning and other foods: Mayonnaise. Premade sauces and marinades. The items listed may not be a complete list. Talk with your dietitian about what dietary choices are right for you. Summary  The Mediterranean diet includes both food and lifestyle choices.  Eat a variety of fresh fruits and vegetables, beans, nuts, seeds, and whole grains.  Limit the amount of red meat and sweets that you eat.  Talk with your health care provider about whether it is safe for you to drink red wine in moderation. This means 1 glass a day for nonpregnant women and 2 glasses a day for men. A glass of wine equals 5 oz (150 mL). This information is not intended to replace advice given to you by your health care provider. Make  sure you discuss any questions you have with your health care provider. Document Revised: 01/15/2016 Document Reviewed: 01/08/2016 Elsevier Patient Education  2020 [employer].    Signed, Buford Dresser, MD PhD 12/12/2019  [place]

## 2019-12-13 ENCOUNTER — Other Ambulatory Visit: Payer: Self-pay | Admitting: Internal Medicine

## 2019-12-13 DIAGNOSIS — I2510 Atherosclerotic heart disease of native coronary artery without angina pectoris: Secondary | ICD-10-CM

## 2019-12-13 DIAGNOSIS — I10 Essential (primary) hypertension: Secondary | ICD-10-CM

## 2019-12-13 MED FILL — CARVEDILOL 25 MG TABLET: 25 | 30 days supply | Qty: 60 | Fill #0

## 2019-12-13 MED FILL — ATORVASTATIN CALCIUM 40 MG: 40 | 30 days supply | Qty: 30 | Fill #1

## 2019-12-13 MED FILL — AMLODIPINE BESYLATE 5 MG TA: 5 | 30 days supply | Qty: 30 | Fill #1

## 2019-12-18 ENCOUNTER — Telehealth: Payer: Self-pay

## 2019-12-18 ENCOUNTER — Other Ambulatory Visit: Payer: Self-pay | Admitting: Internal Medicine

## 2019-12-18 MED ORDER — INSULIN ASPART 100 UNIT/ML ~~LOC~~ SOLN
28.0000 [IU] | Freq: Three times a day (TID) | SUBCUTANEOUS | 11 refills | Status: DC
Start: 2019-12-18 — End: 2019-12-18

## 2019-12-18 NOTE — Telephone Encounter (Signed)
Pt's ins prefers Novolog, if appropriate can you send in a new script for Novolog to replace Humalog for her next refill?

## 2019-12-21 ENCOUNTER — Encounter: Payer: Self-pay | Admitting: Neurology

## 2019-12-21 ENCOUNTER — Ambulatory Visit (INDEPENDENT_AMBULATORY_CARE_PROVIDER_SITE_OTHER): Payer: Medicare Other | Admitting: Neurology

## 2019-12-21 VITALS — BP 152/88 | HR 86 | Ht 64.0 in | Wt 285.0 lb

## 2019-12-21 DIAGNOSIS — I679 Cerebrovascular disease, unspecified: Secondary | ICD-10-CM

## 2019-12-21 DIAGNOSIS — R519 Headache, unspecified: Secondary | ICD-10-CM | POA: Diagnosis not present

## 2019-12-21 MED ORDER — TIZANIDINE HCL 4 MG PO TABS
4.0000 mg | ORAL_TABLET | Freq: Four times a day (QID) | ORAL | 6 refills | Status: DC | PRN
Start: 2019-12-21 — End: 2020-09-16

## 2019-12-21 MED ORDER — BUTALBITAL-APAP-CAFFEINE 50-325-40 MG PO TABS
1.0000 | ORAL_TABLET | Freq: Four times a day (QID) | ORAL | 3 refills | Status: DC | PRN
Start: 2019-12-21 — End: 2021-10-22

## 2019-12-21 MED ORDER — TOPIRAMATE 50 MG PO TABS
50.0000 mg | ORAL_TABLET | Freq: Two times a day (BID) | ORAL | 6 refills | Status: DC
Start: 2019-12-21 — End: 2020-10-08

## 2019-12-21 MED FILL — BUTALB-ACETAMIN-CAFF 50-325: 50-325-40 | 30 days supply | Qty: 12 | Fill #0

## 2019-12-21 MED FILL — TOPIRAMATE 50 MG TABLET: 50 | 30 days supply | Qty: 60 | Fill #0

## 2019-12-21 MED FILL — tiZANidine HCL 4 MG TABS: 4 | 7 days supply | Qty: 30 | Fill #0

## 2019-12-21 NOTE — Progress Notes (Signed)
HISTORICAL  Kimberly Frazier is a 59 year old female, seen in request by primary care physician Dr. Wynetta Emery, [facility] for evaluation of left-sided headache, initial evaluation was on December 21, 2019  I reviewed and summarized the referring note.PMH HTN DM 20 years, insulin dependent CKD CAD  She denies a previous history of headache, her mother, and sister both suffered migraine, since 2020, she began to have frequent headaches, it seems to always at the left side, starting from left nuchal area, spreading forward, sometimes with light noise sensitivity, nauseous, she preferred to lie down in a dark quiet room resting for few hours, sleep usually help her headache  Over the past 1 year, she has been taking titrating dose of daily Advil or Tylenol up to 3-4 doses each day with limited help, worry about long-term side effect, she has quit frequent over-the-counter use around April 2021,  She seems to have mild improvement, but still has daily mild left side pressure headaches, couple times each months, it would exacerbated to a more severe headache with light noise sensitivity  She also reported significant weight gain over the past 2 months, 30 pounds over 2 months, lack of physical activity, denies chewing difficulty, no jaw claudication  We personally reviewed MRI brain on November 29 2019: Mild supratentorium small vessel disease  Laboratory evaluation in June 2021, negative Covid testing, A1c was elevated 9.4, liver functional test showed no significant abnormality, lipid panel showed LDL 75, BMP showed elevated glucose 172, creatinine 0.99  REVIEW OF SYSTEMS: Full 14 system review of systems performed and notable only for as above All other review of systems were negative.  ALLERGIES: No Known Allergies  HOME MEDICATIONS: Current Outpatient Medications  Medication Sig Dispense Refill  . Accu-Chek Softclix Lancets lancets Use to check blood sugar 3 times daily. 100 each 6  . albuterol  (VENTOLIN HFA) 108 (90 Base) MCG/ACT inhaler Inhale 2 puffs into the lungs every 6 (six) hours as needed for wheezing or shortness of breath. 18 g 0  . amLODipine (NORVASC) 5 MG tablet TAKE 1 TABLET (5 MG TOTAL) BY MOUTH DAILY. 30 tablet 2  . aspirin EC 81 MG tablet Take 1 tablet (81 mg total) daily by mouth. 100 tablet 1  . atorvastatin (LIPITOR) 40 MG tablet TAKE 1 TABLET DAILY BY MOUTH. 30 tablet 2  . Blood Glucose Monitoring Suppl (ACCU-CHEK GUIDE) w/Device KIT 1 kit by Does not apply route in the morning, at noon, and at bedtime. Use to check blood sugar 3 times daily. 1 kit 0  . Bromfenac Sodium (PROLENSA) 0.07 % SOLN Place 1 drop into both eyes 4 (four) times daily. 6 mL 0  . butalbital-acetaminophen-caffeine (FIORICET) 50-325-40 MG tablet Take 1-2 tablets by mouth 2 (two) times daily as needed for headache. 30 tablet 0  . carvedilol (COREG) 25 MG tablet TAKE 1 TABLET (25 MG TOTAL) BY MOUTH 2 (TWO) TIMES DAILY WITH A MEAL. 60 tablet 0  . dapagliflozin propanediol (FARXIGA) 5 MG TABS tablet Take 1 tablet (5 mg total) by mouth daily before breakfast. 30 tablet 2  . glucose blood (ACCU-CHEK GUIDE) test strip Use to check blood sugar 3 times daily. 100 each 6  . insulin aspart (NOVOLOG) 100 UNIT/ML injection Inject 28 Units into the skin 3 (three) times daily before meals. 20 mL 11  . insulin glargine (LANTUS SOLOSTAR) 100 UNIT/ML Solostar Pen INJECT 55 UNITS INTO THE SKIN DAILY. 45 mL 6  . Insulin Syringe-Needle U-100 (TRUEPLUS INSULIN SYRINGE) 31G X 5/16"  0.3 ML MISC Use to inject Humalog three times daily. 100 each 11  . nitroGLYCERIN (NITROSTAT) 0.4 MG SL tablet Place 1 tab under tongue for chest pain.  May repeat after 5 minutes x 2.  DO NOT TAKE MORE THAN 3 TABS DURING AN EPISODE OF CHEST PAIN (Patient taking differently: Place 0.4 mg under the tongue every 5 (five) minutes as needed for chest pain. DO NOT TAKE MORE THAN 3 TABS DURING AN EPISODE OF CHEST PAIN) 25 tablet 6  . potassium chloride  (K-DUR) 10 MEQ tablet Take 2 tablets (20 mEq total) by mouth 2 (two) times daily. 120 tablet 1  . prednisoLONE acetate (PRED FORTE) 1 % ophthalmic suspension Place 1 drop into both eyes 4 (four) times daily. 15 mL 5  . TRUEPLUS PEN NEEDLES 32G X 4 MM MISC USE AS DIRECTED WITH INSULIN 100 each 0  . isosorbide mononitrate (IMDUR) 60 MG 24 hr tablet Take 1 tablet (60 mg total) by mouth daily. 90 tablet 3   No current facility-administered medications for this visit.    PAST MEDICAL HISTORY: Past Medical History:  Diagnosis Date  . Adrenal adenoma, left   . Arthritis   . Cataract    Bilateral  . CKD (chronic kidney disease), stage II   . Coronary artery disease 07-28-2018 [facility]  currently due to no insurance   per cardiac cath 02-06-2015 (positive mild lateral ishcemia on stress test)--- dLAD 80%,  mLAD 40%,  mPDA 80% (small vessel),  ostial D1 70%,  CFx with lumial irregarlities-- medical management  . Depression   . Diabetic neuropathy ([place])   . Headache   . History of Bell's palsy 07/2011   per pt residual facial pain on left side occasionally  . History of cancer of vagina 1999   per pt completed radiation and chemo  . History of sepsis 12/30/2017   positive blood culter fro E.coli  . Hyperlipidemia   . Hypertension   . Hypokalemia   . Insulin dependent type 2 diabetes mellitus, uncontrolled ([place])    followed by pcp---  A1c was 11.7 on 06-15-2018 in epic  . Myocardial infarction ([place])   . Nocturia   . Peripheral neuropathy   . PMB (postmenopausal bleeding)   . Wears dentures    upper  . Wears glasses     PAST SURGICAL HISTORY: Past Surgical History:  Procedure Laterality Date  . BREAST BIOPSY  2012   benign  . CARDIAC CATHETERIZATION N/A 02/06/2015   Procedure: Left Heart Cath and Coronary Angiography;  Surgeon: Larey Dresser, MD;  Location: [facility];  Service: Cardiovascular;  Laterality: N/A;  . CATARACT EXTRACTION,  BILATERAL    . HYSTEROSCOPY WITH D & C N/A 08/01/2018   Procedure: DILATATION AND CURETTAGE /HYSTEROSCOPY;  Surgeon: Mora Bellman, MD;  Location: [facility];  Service: Gynecology;  Laterality: N/A;  . ROBOTIC ASSISTED TOTAL HYSTERECTOMY WITH BILATERAL SALPINGO OOPHERECTOMY N/A 11/28/2018   Procedure: XI ROBOTIC ASSISTED TOTAL HYSTERECTOMY WITH BILATERAL SALPINGO OOPHORECTOMY;  Surgeon: Everitt Amber, MD;  Location: [facility];  Service: Gynecology;  Laterality: N/A;  . SENTINEL NODE BIOPSY N/A 11/28/2018   Procedure: SENTINEL NODE BIOPSY;  Surgeon: Everitt Amber, MD;  Location: [facility];  Service: Gynecology;  Laterality: N/A;  . UMBILICAL HERNIA REPAIR  child    FAMILY HISTORY: Family History  Problem Relation Age of Onset  . Heart disease Mother   . Hypertension Mother   . Diabetes Mother   .  Cancer Father        hodgkins lymphoma  . Heart disease Sister        heart attack  . Diabetes Sister   . Cancer Other        parent  . Diabetes Other        parent  . Heart disease Other        parent  . Hyperlipidemia Other        parent  . Hypertension Other        parent  . Arthritis Other        parent  . Diabetes Maternal Aunt   . Diabetes Maternal Uncle   . Breast cancer Neg Hx     SOCIAL HISTORY: Social History   Socioeconomic History  . Marital status: Single    Spouse name: Not on file  . Number of children: 2  . Years of education: 58  . Highest education level: Not on file  Occupational History  . Occupation: retired  Tobacco Use  . Smoking status: Never Smoker  . Smokeless tobacco: Never Used  Vaping Use  . Vaping Use: Never used  Substance and Sexual Activity  . Alcohol use: Not Currently  . Drug use: Not Currently    Types: Marijuana    Comment: 07-28-2018  per pt last smoked 11/21/2018  . Sexual activity: Yes  Other Topics Concern  . Not on file  Social History Narrative   Lives at home with her grandson.   Right-handed.   No daily caffeine  use.   Social Determinants of Health   Financial Resource Strain:   . Difficulty of Paying Living Expenses:   Food Insecurity:   . Worried About Charity fundraiser in the Last Year:   . Arboriculturist in the Last Year:   Transportation Needs:   . Film/video editor (Medical):   Marland Kitchen Lack of Transportation (Non-Medical):   Physical Activity:   . Days of Exercise per Week:   . Minutes of Exercise per Session:   Stress:   . Feeling of Stress :   Social Connections:   . Frequency of Communication with Friends and Family:   . Frequency of Social Gatherings with Friends and Family:   . Attends Religious Services:   . Active Member of Clubs or Organizations:   . Attends Archivist Meetings:   Marland Kitchen Marital Status:   Intimate Partner Violence:   . Fear of Current or Ex-Partner:   . Emotionally Abused:   Marland Kitchen Physically Abused:   . Sexually Abused:      PHYSICAL EXAM   Vitals:   12/21/19 0911  BP: (!) 152/88  Pulse: 86  Weight: (!) 285 lb (129.3 kg)  Height: 5' 4"  (1.626 m)   Not recorded     Body mass index is 48.92 kg/m.  PHYSICAL EXAMNIATION:  Gen: NAD, conversant, well nourised, well groomed                     Cardiovascular: Regular rate rhythm, no peripheral edema, warm, nontender. Eyes: Conjunctivae clear without exudates or hemorrhage Neck: Supple, no carotid bruits. Pulmonary: Clear to auscultation bilaterally   NEUROLOGICAL EXAM:  MENTAL STATUS: Speech:    Speech is normal; fluent and spontaneous with normal comprehension.  Cognition:     Orientation to time, place and person     Normal recent and remote memory     Normal Attention span and concentration  Normal Language, naming, repeating,spontaneous speech     Fund of knowledge   CRANIAL NERVES: CN II: Visual fields are full to confrontation. Pupils are round equal and briskly reactive to light. CN III, IV, VI: extraocular movement are normal. No ptosis. CN V: Facial sensation is  intact to light touch CN VII: Face is symmetric with normal eye closure  CN VIII: Hearing is normal to causal conversation. CN IX, X: Phonation is normal. CN XI: Head turning and shoulder shrug are intact  MOTOR: There is no pronator drift of out-stretched arms. Muscle bulk and tone are normal. Muscle strength is normal.  REFLEXES: Reflexes are 1 and symmetric at the biceps, triceps, knees, and absent at ankles. Plantar responses are flexor.  SENSORY: Length dependent decreased to light touch, pinprick and vibratory sensation to ankle level  COORDINATION: There is no trunk or limb dysmetria noted.  GAIT/STANCE: She can get up from seated position arms crossed, mildly unsteady, limited by her big body habitus.  Romberg is absent.   DIAGNOSTIC DATA (LABS, IMAGING, TESTING) - I reviewed patient records, labs, notes, testing and imaging myself where available.   ASSESSMENT AND PLAN  Shaqueta Casady is a 59 y.o. female   New onset headaches Cerebral small vessel disease Left neck pain, is unknown left nuchal line   Has migraine features, likely a musculoskeletal component  MRI of the brain showed cerebral small vessel disease, she has multiple risk factor of obesity, hypertension, hyperlipidemia, diabetes that is on the poorly controlled, most recent A1c was 9.7,  Complete evaluation with ultrasound of carotid artery  ESR C-reactive protein to rule out temporal arteritis  I have suggested her neck stretching exercise, warm compression, massage of left nuchal line area  Fioricet as needed, may combine it with tizanidine for moderate to severe headaches     Marcial Pacas, M.D. Ph.D.  [facility] [address], [place], [place] [address] Ph: [phone] Fax: [phone]  CC:  Ladell Pier, MD [address] [place],  [place] [address]

## 2019-12-21 NOTE — Progress Notes (Signed)
[facility] Note  12/24/2019     CHIEF COMPLAINT Patient presents for Retina Follow Up   HISTORY OF PRESENT ILLNESS: Kimberly Frazier is a 59 y.o. female who presents to the clinic today for:   HPI    Retina Follow Up    Patient presents with  Diabetic Retinopathy.  In both eyes.  This started 2 months ago.  Severity is moderate.  I, the attending physician,  performed the HPI with the patient and updated documentation appropriately.          Comments    Patient here for 2 months retina follow up for NPDR OU. Patient states vision doing ok. OS has eye pain. Constant nagging pain.       Last edited by Bernarda Caffey, MD on 12/24/2019 10:39 AM. (History)    Delayed from 4 wks to 2 mos.    Referring physician: Ladell Pier, MD [place],  [place] [address]  HISTORICAL INFORMATION:   Selected notes from the MEDICAL RECORD NUMBER Referred by Dr. Rutherford Guys for concern of CME   CURRENT MEDICATIONS: Current Outpatient Medications (Ophthalmic Drugs)  Medication Sig  . Bromfenac Sodium (PROLENSA) 0.07 % SOLN Place 1 drop into both eyes 4 (four) times daily.  . prednisoLONE acetate (PRED FORTE) 1 % ophthalmic suspension Place 1 drop into both eyes 4 (four) times daily.   No current facility-administered medications for this visit. (Ophthalmic Drugs)   Current Outpatient Medications (Other)  Medication Sig  . Accu-Chek Softclix Lancets lancets Use to check blood sugar 3 times daily.  Marland Kitchen albuterol (VENTOLIN HFA) 108 (90 Base) MCG/ACT inhaler Inhale 2 puffs into the lungs every 6 (six) hours as needed for wheezing or shortness of breath.  Marland Kitchen amLODipine (NORVASC) 5 MG tablet TAKE 1 TABLET (5 MG TOTAL) BY MOUTH DAILY.  Marland Kitchen aspirin EC 81 MG tablet Take 1 tablet (81 mg total) daily by mouth.  Marland Kitchen atorvastatin (LIPITOR) 40 MG tablet TAKE 1 TABLET DAILY BY MOUTH.  . Blood Glucose Monitoring Suppl (ACCU-CHEK GUIDE) w/Device KIT 1 kit by Does not  apply route in the morning, at noon, and at bedtime. Use to check blood sugar 3 times daily.  . butalbital-acetaminophen-caffeine (FIORICET) 50-325-40 MG tablet Take 1-2 tablets by mouth 2 (two) times daily as needed for headache.  . butalbital-acetaminophen-caffeine (FIORICET) 50-325-40 MG tablet Take 1 tablet by mouth every 6 (six) hours as needed for headache.  . carvedilol (COREG) 25 MG tablet TAKE 1 TABLET (25 MG TOTAL) BY MOUTH 2 (TWO) TIMES DAILY WITH A MEAL.  . dapagliflozin propanediol (FARXIGA) 5 MG TABS tablet Take 1 tablet (5 mg total) by mouth daily before breakfast.  . glucose blood (ACCU-CHEK GUIDE) test strip Use to check blood sugar 3 times daily.  . insulin aspart (NOVOLOG) 100 UNIT/ML injection Inject 28 Units into the skin 3 (three) times daily before meals.  . insulin glargine (LANTUS SOLOSTAR) 100 UNIT/ML Solostar Pen INJECT 55 UNITS INTO THE SKIN DAILY.  Marland Kitchen Insulin Syringe-Needle U-100 (TRUEPLUS INSULIN SYRINGE) 31G X 5/16" 0.3 ML MISC Use to inject Humalog three times daily.  . isosorbide mononitrate (IMDUR) 60 MG 24 hr tablet Take 1 tablet (60 mg total) by mouth daily.  . nitroGLYCERIN (NITROSTAT) 0.4 MG SL tablet Place 1 tab under tongue for chest pain.  May repeat after 5 minutes x 2.  DO NOT TAKE MORE THAN 3 TABS DURING AN EPISODE OF CHEST PAIN (Patient taking differently: Place  0.4 mg under the tongue every 5 (five) minutes as needed for chest pain. DO NOT TAKE MORE THAN 3 TABS DURING AN EPISODE OF CHEST PAIN)  . potassium chloride (K-DUR) 10 MEQ tablet Take 2 tablets (20 mEq total) by mouth 2 (two) times daily.  Marland Kitchen tiZANidine (ZANAFLEX) 4 MG tablet Take 1 tablet (4 mg total) by mouth every 6 (six) hours as needed for muscle spasms.  Marland Kitchen topiramate (TOPAMAX) 50 MG tablet Take 1 tablet (50 mg total) by mouth 2 (two) times daily.  . TRUEPLUS PEN NEEDLES 32G X 4 MM MISC USE AS DIRECTED WITH INSULIN   No current facility-administered medications for this visit. (Other)       REVIEW OF SYSTEMS: ROS    Positive for: Endocrine, Eyes   Negative for: Constitutional, Gastrointestinal, Neurological, Skin, Genitourinary, Musculoskeletal, HENT, Cardiovascular, Respiratory, Psychiatric, Allergic/Imm, Heme/Lymph   Last edited by Theodore Demark, COA on 12/24/2019  9:32 AM. (History)       ALLERGIES No Known Allergies  PAST MEDICAL HISTORY Past Medical History:  Diagnosis Date  . Adrenal adenoma, left   . Arthritis   . Cataract    Bilateral  . CKD (chronic kidney disease), stage II   . Coronary artery disease 07-28-2018 [facility]  currently due to no insurance   per cardiac cath 02-06-2015 (positive mild lateral ishcemia on stress test)--- dLAD 80%,  mLAD 40%,  mPDA 80% (small vessel),  ostial D1 70%,  CFx with lumial irregarlities-- medical management  . Depression   . Diabetic neuropathy ([place])   . Headache   . History of Bell's palsy 07/2011   per pt residual facial pain on left side occasionally  . History of cancer of vagina 1999   per pt completed radiation and chemo  . History of sepsis 12/30/2017   positive blood culter fro E.coli  . Hyperlipidemia   . Hypertension   . Hypokalemia   . Insulin dependent type 2 diabetes mellitus, uncontrolled ([place])    followed by pcp---  A1c was 11.7 on 06-15-2018 in epic  . Myocardial infarction ([place])   . Nocturia   . Peripheral neuropathy   . PMB (postmenopausal bleeding)   . Wears dentures    upper  . Wears glasses    Past Surgical History:  Procedure Laterality Date  . BREAST BIOPSY  2012   benign  . CARDIAC CATHETERIZATION N/A 02/06/2015   Procedure: Left Heart Cath and Coronary Angiography;  Surgeon: Larey Dresser, MD;  Location: [facility];  Service: Cardiovascular;  Laterality: N/A;  . CATARACT EXTRACTION, BILATERAL    . HYSTEROSCOPY WITH D & C N/A 08/01/2018   Procedure: DILATATION AND CURETTAGE /HYSTEROSCOPY;  Surgeon: Mora Bellman, MD;  Location:  [facility];  Service: Gynecology;  Laterality: N/A;  . ROBOTIC ASSISTED TOTAL HYSTERECTOMY WITH BILATERAL SALPINGO OOPHERECTOMY N/A 11/28/2018   Procedure: XI ROBOTIC ASSISTED TOTAL HYSTERECTOMY WITH BILATERAL SALPINGO OOPHORECTOMY;  Surgeon: Everitt Amber, MD;  Location: [facility];  Service: Gynecology;  Laterality: N/A;  . SENTINEL NODE BIOPSY N/A 11/28/2018   Procedure: SENTINEL NODE BIOPSY;  Surgeon: Everitt Amber, MD;  Location: [facility];  Service: Gynecology;  Laterality: N/A;  . UMBILICAL HERNIA REPAIR  child    FAMILY HISTORY Family History  Problem Relation Age of Onset  . Heart disease Mother   . Hypertension Mother   . Diabetes Mother   . Cancer Father        hodgkins lymphoma  .  Heart disease Sister        heart attack  . Diabetes Sister   . Cancer Other        parent  . Diabetes Other        parent  . Heart disease Other        parent  . Hyperlipidemia Other        parent  . Hypertension Other        parent  . Arthritis Other        parent  . Diabetes Maternal Aunt   . Diabetes Maternal Uncle   . Breast cancer Neg Hx     SOCIAL HISTORY Social History   Tobacco Use  . Smoking status: Never Smoker  . Smokeless tobacco: Never Used  Vaping Use  . Vaping Use: Never used  Substance Use Topics  . Alcohol use: Not Currently  . Drug use: Not Currently    Types: Marijuana    Comment: 07-28-2018  per pt last smoked 11/21/2018         OPHTHALMIC EXAM:  Base Eye Exam    Visual Acuity (Snellen - Linear)      Right Left   Dist [place] 20/25 -1 20/350 -1   Dist ph [place] NI 20/200 +2       Tonometry (Tonopen, 9:29 AM)      Right Left   Pressure 17 22       Pupils      Dark Light Shape React APD   Right 3 2 Round Brisk None   Left 3 2 Round Brisk None       Visual Fields (Counting fingers)      Left Right    Full Full       Extraocular Movement      Right Left    Full, Ortho Full, Ortho       Neuro/Psych    Oriented x3: Yes   Mood/Affect:  Normal       Dilation    Both eyes: 1.0% Mydriacyl, 2.5% Phenylephrine @ 9:29 AM        Slit Lamp and Fundus Exam    Slit Lamp Exam      Right Left   Lids/Lashes Dermatochalasis - upper lid Dermatochalasis - upper lid   Conjunctiva/Sclera Mild Melanosis Mild Melanosis   Cornea 1+ Punctate epithelial erosions 1+ Punctate epithelial erosions, well healed temporal cataract wounds   Anterior Chamber Deep, 0.5+cell/pigment Deep, 0.5+cell/pigment   Iris Round and dilated, No NVI Round and dilated, No NVI   Lens PC IOL in good position with trace PCO PC IOL in good position with trace PCO   Vitreous Vitreous syneresis Vitreous syneresis       Fundus Exam      Right Left   Disc Pallor, Sharp rim Compact, Pink and Sharp   C/D Ratio 0.1 0.0   Macula Good foveal reflex, +central edema - slightly increased, +Microaneurysms greatest inferiorly and nasally Blunted foveal reflex, central edema - worse, +edudate/MA/IRH   Vessels Vascular attenuation, Tortuous Vascular attenuation, Tortuous, +venous beading, AV crossing changes   Periphery Attached, scattered drusen, scattered IRH Attached, scattered DBH greatest posteriorly -- improving        Refraction    Wearing Rx      Sphere   Right OTC   Left OTC          IMAGING AND PROCEDURES  Imaging and Procedures for '@TODAY'$ @  OCT, Retina - OU - Both Eyes  Right Eye Quality was good. Central Foveal Thickness: 317. Progression has worsened. Findings include abnormal foveal contour, intraretinal fluid, no SRF, intraretinal hyper-reflective material, vitreomacular adhesion  (Persistent IRF -- trace interval increase in IRF and worse foveal profile.).   Left Eye Quality was good. Central Foveal Thickness: 377. Progression has worsened. Findings include abnormal foveal contour, intraretinal fluid, subretinal fluid, intraretinal hyper-reflective material, vitreomacular adhesion  (Interval increase in IRF).   Notes *Images captured and  stored on drive  Diagnosis / Impression:  Macular edema OU - likely combination of CME + DME OU OD: Interval increase in IRF and worse foveal profile. OS: Interval increase in IRF  Clinical management:  See below  Abbreviations: NFP - Normal foveal profile. CME - cystoid macular edema. PED - pigment epithelial detachment. IRF - intraretinal fluid. SRF - subretinal fluid. EZ - ellipsoid zone. ERM - epiretinal membrane. ORA - outer retinal atrophy. ORT - outer retinal tubulation. SRHM - subretinal hyper-reflective material        Intravitreal Injection, Pharmacologic Agent - OD - Right Eye       Time Out 12/24/2019. 10:44 AM. Confirmed correct patient, procedure, site, and patient consented.   Anesthesia Topical anesthesia was used. Anesthetic medications included Lidocaine 2%, Proparacaine 0.5%.   Procedure Preparation included 5% betadine to ocular surface, eyelid speculum. A supplied needle was used.   Injection:  1.25 mg Bevacizumab (AVASTIN) SOLN   NDC: 13244-010-27, Lot: [id number]'$ , Expiration date: 01/23/2020   Route: Intravitreal, Site: Right Eye, Waste: 0 mL  Post-op Post injection exam found visual acuity of at least counting fingers. The patient tolerated the procedure well. There were no complications. The patient received written and verbal post procedure care education.        Intravitreal Injection, Pharmacologic Agent - OS - Left Eye       Time Out 12/24/2019. 10:45 AM. Confirmed correct patient, procedure, site, and patient consented.   Anesthesia Topical anesthesia was used. Anesthetic medications included Lidocaine 2%, Proparacaine 0.5%.   Procedure Preparation included 5% betadine to ocular surface, eyelid speculum. A (32g) needle was used.   Injection:  2 mg aflibercept 01/06/2020) SOLN   NDC: Alfonse Flavors, Lot: [id number], Expiration date: 03/30/2020   Route: Intravitreal, Site: Left Eye, Waste: 0.05 mL  Post-op Post injection exam found visual  acuity of at least counting fingers. The patient tolerated the procedure well. There were no complications. The patient received written and verbal post procedure care education.                 ASSESSMENT/PLAN:    ICD-10-CM   1. Moderate nonproliferative diabetic retinopathy of both eyes with macular edema associated with type 2 diabetes mellitus (HCC)  04/12/2020 Intravitreal Injection, Pharmacologic Agent - OD - Right Eye    Intravitreal Injection, Pharmacologic Agent - OS - Left Eye    aflibercept (EYLEA) SOLN 2 mg    Bevacizumab (AVASTIN) SOLN 1.25 mg  2. CME (cystoid macular edema), bilateral  H35.353   3. Retinal edema  H35.81 OCT, Retina - OU - Both Eyes  4. Essential hypertension  I10   5. Hypertensive retinopathy of both eyes  H35.033   6. Pseudophakia of both eyes  Z96.1     1-3. Moderate Non-proliferative diabetic retinopathy, OU OD with combination DME/CME  - exam shows scattered IRH, edema and tortuous blood vessels OU  - FA (10.23.20) shows leaking MA OU and paracentral petaloid hyperfluorecence OD --> CME/[place]  - OCT shows diabetic macular edema, OU; OD with  CME/[place] component contributing as well  - s/p IVA OS #1 (10.23.20), #2 (11.18.20), #3 (12.18.20), #4 (02.24.21), #5 (03.24.21)  - s/p IVA OD #1 (11.06.20), #2 (12.18.20), #3 (02.24.21), #4 (03.24.21), #5 (04.21.21), #6 (5.26.21)  - s/p IVE OS #1 (04.21.21), # 2 (5.26.21)  - delayed f/u from 4 wks to 2 mos   - BCVA OD 20/25; OS 20/200  - OCT today 7.26.21 shows interval increase in IRF  - recommend IVA OD #7 and IVE #3 OS 7.26.21  - pt wishes to proceed  - RBA of procedure discussed, questions answered  - informed consent obtained  - Avastin informed consent form signed and scanned on 12.18.2020  - Eylea OS informed consent form signed and scanned on 4.21.2021  - see procedure note  - Eylea4U benefits investigation started, 04.21.21  - cont Pred Forte and Ketorolac QID OU  - f/u in 4 weeks,  DFE, OCT, possible injection OU  4,5. Hypertensive retinopathy OU  - discussed importance of tight BP control  - monitor  6. Pseudophakia OU  - s/p CE/IOL OU Gershon Crane)   OD: 03/07/2019   OS: 02/28/2019  - IOLs in good position  - CME OU as above  - monitor  Ophthalmic Meds Ordered this visit:  Meds ordered this encounter  Medications  . aflibercept (EYLEA) SOLN 2 mg  . Bevacizumab (AVASTIN) SOLN 1.25 mg       Return in about 4 weeks (around 01/21/2020) for f/u DME OU - Dilated Exam, OCT, Possible Injxn.  There are no Patient Instructions on file for this visit.   Explained the diagnoses, plan, and follow up with the patient and they expressed understanding.  Patient expressed understanding of the importance of proper follow up care.   This document serves as a record of services personally performed by Gardiner Sleeper, MD, PhD. It was created on their behalf by Estill Bakes, COT an ophthalmic technician. The creation of this record is the provider's dictation and/or activities during the visit.    Electronically signed by: Estill Bakes, COT 12/21/19 @ 1:20 PM   Gardiner Sleeper, M.D., Ph.D. Diseases & Surgery of the Retina and [place] 12/24/2019   I have reviewed the above documentation for accuracy and completeness, and I agree with the above. Gardiner Sleeper, M.D., Ph.D. 12/24/19 1:26 PM   Abbreviations: M myopia (nearsighted); A astigmatism; H hyperopia (farsighted); P presbyopia; Mrx spectacle prescription;  CTL contact lenses; OD right eye; OS left eye; OU both eyes  XT exotropia; ET esotropia; PEK punctate epithelial keratitis; PEE punctate epithelial erosions; DES dry eye syndrome; MGD meibomian gland dysfunction; ATs artificial tears; PFAT's preservative free artificial tears; [place] nuclear sclerotic cataract; PSC posterior subcapsular cataract; ERM epi-retinal membrane; PVD posterior vitreous detachment; RD retinal detachment; DM  diabetes mellitus; DR diabetic retinopathy; NPDR non-proliferative diabetic retinopathy; PDR proliferative diabetic retinopathy; CSME clinically significant macular edema; DME diabetic macular edema; dbh dot blot hemorrhages; CWS cotton wool spot; POAG primary open angle glaucoma; C/D cup-to-disc ratio; HVF humphrey visual field; GVF goldmann visual field; OCT optical coherence tomography; IOP intraocular pressure; BRVO Branch retinal vein occlusion; CRVO central retinal vein occlusion; CRAO central retinal artery occlusion; BRAO branch retinal artery occlusion; RT retinal tear; SB scleral buckle; PPV pars plana vitrectomy; VH Vitreous hemorrhage; PRP panretinal laser photocoagulation; IVK intravitreal kenalog; VMT vitreomacular traction; MH Macular hole;  NVD neovascularization of the disc; NVE neovascularization elsewhere; AREDS age related eye disease study; ARMD age related  macular degeneration; POAG primary open angle glaucoma; EBMD epithelial/anterior basement membrane dystrophy; ACIOL anterior chamber intraocular lens; IOL intraocular lens; PCIOL posterior chamber intraocular lens; Phaco/IOL phacoemulsification with intraocular lens placement; [place] photorefractive keratectomy; LASIK laser assisted in situ keratomileusis; HTN hypertension; DM diabetes mellitus; COPD chronic obstructive pulmonary disease

## 2019-12-21 NOTE — Patient Instructions (Signed)
You only treat moderate to severe headaches,  Fioricet as needed  You may combine it with tizanidine for muscle relaxant,  Topamax 50 mg twice a day as headache prevention

## 2019-12-22 LAB — C-REACTIVE PROTEIN: CRP: 20 mg/L — ABNORMAL HIGH (ref 0–10)

## 2019-12-22 LAB — SEDIMENTATION RATE: Sed Rate: 26 mm/hr (ref 0–40)

## 2019-12-22 LAB — CK: Total CK: 123 U/L (ref 32–182)

## 2019-12-22 LAB — ANA W/REFLEX IF POSITIVE: Anti Nuclear Antibody (ANA): NEGATIVE

## 2019-12-22 LAB — TSH: TSH: 1.05 u[IU]/mL (ref 0.450–4.500)

## 2019-12-22 LAB — RPR: RPR Ser Ql: NONREACTIVE

## 2019-12-22 LAB — VITAMIN B12: Vitamin B-12: 828 pg/mL (ref 232–1245)

## 2019-12-24 ENCOUNTER — Ambulatory Visit (INDEPENDENT_AMBULATORY_CARE_PROVIDER_SITE_OTHER): Payer: Medicare Other | Admitting: Ophthalmology

## 2019-12-24 ENCOUNTER — Other Ambulatory Visit: Payer: Self-pay

## 2019-12-24 ENCOUNTER — Encounter (INDEPENDENT_AMBULATORY_CARE_PROVIDER_SITE_OTHER): Payer: Self-pay | Admitting: Ophthalmology

## 2019-12-24 DIAGNOSIS — E113313 Type 2 diabetes mellitus with moderate nonproliferative diabetic retinopathy with macular edema, bilateral: Secondary | ICD-10-CM

## 2019-12-24 DIAGNOSIS — H3581 Retinal edema: Secondary | ICD-10-CM | POA: Diagnosis not present

## 2019-12-24 DIAGNOSIS — I10 Essential (primary) hypertension: Secondary | ICD-10-CM | POA: Diagnosis not present

## 2019-12-24 DIAGNOSIS — H35353 Cystoid macular degeneration, bilateral: Secondary | ICD-10-CM

## 2019-12-24 DIAGNOSIS — Z961 Presence of intraocular lens: Secondary | ICD-10-CM

## 2019-12-24 DIAGNOSIS — H35033 Hypertensive retinopathy, bilateral: Secondary | ICD-10-CM

## 2019-12-24 MED ORDER — BEVACIZUMAB CHEMO INJECTION 1.25MG/0.05ML SYRINGE FOR KALEIDOSCOPE
1.2500 mg | INTRAVITREAL | Status: AC | PRN
  Administered 2019-12-24: 1.25 mg via INTRAVITREAL

## 2019-12-24 MED ORDER — AFLIBERCEPT 2MG/0.05ML IZ SOLN FOR KALEIDOSCOPE
2.0000 mg | INTRAVITREAL | Status: AC | PRN
  Administered 2019-12-24: 2 mg via INTRAVITREAL

## 2019-12-26 ENCOUNTER — Ambulatory Visit (HOSPITAL_COMMUNITY): Payer: Medicare Other | Attending: Neurology

## 2019-12-31 MED FILL — LANTUS SOLOSTAR 100 UNITS/M: 100 | 27 days supply | Qty: 15 | Fill #2

## 2020-01-02 ENCOUNTER — Ambulatory Visit (HOSPITAL_COMMUNITY)
Admission: RE | Admit: 2020-01-02 | Discharge: 2020-01-02 | Disposition: A | Discharge status: Home / Self Care | Payer: Medicare Other | Source: Ambulatory Visit | Attending: Neurology | Admitting: Neurology

## 2020-01-02 ENCOUNTER — Other Ambulatory Visit: Payer: Self-pay

## 2020-01-02 DIAGNOSIS — R519 Headache, unspecified: Secondary | ICD-10-CM | POA: Insufficient documentation

## 2020-01-02 DIAGNOSIS — I679 Cerebrovascular disease, unspecified: Secondary | ICD-10-CM | POA: Insufficient documentation

## 2020-01-05 ENCOUNTER — Other Ambulatory Visit: Payer: Self-pay | Admitting: Internal Medicine

## 2020-01-07 ENCOUNTER — Other Ambulatory Visit: Payer: Self-pay | Admitting: Internal Medicine

## 2020-01-07 NOTE — Telephone Encounter (Signed)
Copied from [place] [id number]. Topic: Quick Communication - Rx Refill/Question >> Jan 07, 2020 11:54 AM Yvette Rack wrote: Medication: potassium chloride (K-DUR) 10 MEQ tablet  Has the patient contacted their pharmacy? yes   Preferred Pharmacy (with phone number or street name): [id number] - [place], [place] Phone: [phone]   Fax: [phone]  Agent: Please be advised that RX refills may take up to 3 business days. We ask that you follow-up with your pharmacy.

## 2020-01-15 ENCOUNTER — Ambulatory Visit: Payer: Medicare Other | Attending: Internal Medicine | Admitting: Internal Medicine

## 2020-01-15 DIAGNOSIS — G43009 Migraine without aura, not intractable, without status migrainosus: Secondary | ICD-10-CM | POA: Diagnosis not present

## 2020-01-15 DIAGNOSIS — Z7189 Other specified counseling: Secondary | ICD-10-CM

## 2020-01-15 DIAGNOSIS — I2510 Atherosclerotic heart disease of native coronary artery without angina pectoris: Secondary | ICD-10-CM | POA: Diagnosis not present

## 2020-01-15 DIAGNOSIS — E876 Hypokalemia: Secondary | ICD-10-CM

## 2020-01-15 DIAGNOSIS — Z794 Long term (current) use of insulin: Secondary | ICD-10-CM

## 2020-01-15 DIAGNOSIS — Z1231 Encounter for screening mammogram for malignant neoplasm of breast: Secondary | ICD-10-CM

## 2020-01-15 DIAGNOSIS — E1159 Type 2 diabetes mellitus with other circulatory complications: Secondary | ICD-10-CM | POA: Diagnosis not present

## 2020-01-15 MED ORDER — POTASSIUM CHLORIDE ER 10 MEQ PO TBCR: 20.0000 meq | EXTENDED_RELEASE_TABLET | Freq: Two times a day (BID) | ORAL | 5 refills | Status: DC

## 2020-01-15 MED ORDER — LANTUS SOLOSTAR 100 UNIT/ML ~~LOC~~ SOPN: PEN_INJECTOR | SUBCUTANEOUS | 6 refills | Status: DC

## 2020-01-15 MED ORDER — DAPAGLIFLOZIN PROPANEDIOL 10 MG PO TABS: 10.0000 mg | ORAL_TABLET | Freq: Every day | ORAL | 6 refills | Status: DC

## 2020-01-15 MED FILL — FARXIGA 10 MG TABLET: 10 | 30 days supply | Qty: 30 | Fill #0

## 2020-01-15 NOTE — Addendum Note (Signed)
Addended bySteffanie Dunn on: 01/15/2020 02:54 PM   Modules accepted: Orders

## 2020-01-15 NOTE — Progress Notes (Signed)
Virtual Visit via Telephone Note Due to current restrictions/limitations of in-office visits due to the COVID-19 pandemic, this scheduled clinical appointment was converted to a telehealth visit  I connected with Kimberly Frazier on 01/15/20 at 8:36 a.m by telephone and verified that I am speaking with the correct person using two identifiers. I am in my office.  The patient is at home.  Only the patient and myself participated in this encounter.  I discussed the limitations, risks, security and privacy concerns of performing an evaluation and management service by telephone and the availability of in person appointments. I also discussed with the patient that there may be a patient responsible charge related to this service. The patient expressed understanding and agreed to proceed.   History of Present Illness: Patient with history of diabetes with neuropathyand retinopathy, HTN with hypertensive heart disease, CAD(Coronary CT revealed occlusion in the mid to distal LAD, distal RCA.Cardiology states that she will need CABG eventually),HL,obesity, endometrial CA s/p hysterectomy.  Migraine Headaches:  Had MRI of head which revealed mild chronic microvascular ischemic changes. -saw Dr. Krista Blue for further eval. her assessment was that headaches are consistent with migraines with a musculoskeletal component.  Recommend continued use of Fioricet as needed.  She did further work-up to rule out temporal arteritis.  Except for mild elevation in C-reactive protein, other lab tests were negative. -Patient reports good relieve with Fioricet.  Takes about 2x/wk  CAD/HTN:  Saw Dr. Harrell Gave about a month ago in follow-up.  Reports compliance with medications including atorvastatin, carvedilol, Norvasc, and isosorbide aspirin.  No chest pains.  No lower extremity edema.  States she has been out of potassium (10 meq 2 tabs BID) supplement for 8-9 days.  Last rxn on chart was written back in June of last year  with 1 month supply and 1 refill.  It was written by Dr. Kelton Pillar.  Last BMP was 08/2019.  Potassium was 3.8.  She wants the prescription to go to [facility] on [address].  DM:  Farxiga added on last visit.  Tolerating okay.  Taking NovoLog 24-30 units TID with meals and Lantus 55 units at bedtime. -BS this a.m before BF was 187.  Checks once a day before BF with range in 200.  -still not trying to exercise.  "I'm lazy and we have foxes around here." -snacks a lot on fruits like grape, watermellon.  HM:  Referred for MMG earlier this yr. Pt reports she was never called.  Plans to get COVID vaccine. Outpatient Encounter Medications as of 01/15/2020  Medication Sig  . Accu-Chek Softclix Lancets lancets Use to check blood sugar 3 times daily.  Marland Kitchen albuterol (VENTOLIN HFA) 108 (90 Base) MCG/ACT inhaler Inhale 2 puffs into the lungs every 6 (six) hours as needed for wheezing or shortness of breath.  Marland Kitchen amLODipine (NORVASC) 5 MG tablet TAKE 1 TABLET (5 MG TOTAL) BY MOUTH DAILY.  Marland Kitchen aspirin EC 81 MG tablet Take 1 tablet (81 mg total) daily by mouth.  Marland Kitchen atorvastatin (LIPITOR) 40 MG tablet TAKE 1 TABLET DAILY BY MOUTH.  . Blood Glucose Monitoring Suppl (ACCU-CHEK GUIDE) w/Device KIT 1 kit by Does not apply route in the morning, at noon, and at bedtime. Use to check blood sugar 3 times daily.  . Bromfenac Sodium (PROLENSA) 0.07 % SOLN Place 1 drop into both eyes 4 (four) times daily.  . butalbital-acetaminophen-caffeine (FIORICET) 50-325-40 MG tablet Take 1-2 tablets by mouth 2 (two) times daily as needed for headache.  . butalbital-acetaminophen-caffeine (FIORICET) [phone]  MG tablet Take 1 tablet by mouth every 6 (six) hours as needed for headache.  . carvedilol (COREG) 25 MG tablet TAKE 1 TABLET (25 MG TOTAL) BY MOUTH 2 (TWO) TIMES DAILY WITH A MEAL.  . dapagliflozin propanediol (FARXIGA) 5 MG TABS tablet Take 1 tablet (5 mg total) by mouth daily before breakfast.  . glucose blood (ACCU-CHEK GUIDE) test strip  Use to check blood sugar 3 times daily.  . insulin aspart (NOVOLOG) 100 UNIT/ML injection Inject 28 Units into the skin 3 (three) times daily before meals.  . insulin glargine (LANTUS SOLOSTAR) 100 UNIT/ML Solostar Pen INJECT 55 UNITS INTO THE SKIN DAILY.  Marland Kitchen Insulin Syringe-Needle U-100 (TRUEPLUS INSULIN SYRINGE) 31G X 5/16" 0.3 ML MISC Use to inject Humalog three times daily.  . isosorbide mononitrate (IMDUR) 60 MG 24 hr tablet Take 1 tablet (60 mg total) by mouth daily.  . nitroGLYCERIN (NITROSTAT) 0.4 MG SL tablet Place 1 tab under tongue for chest pain.  May repeat after 5 minutes x 2.  DO NOT TAKE MORE THAN 3 TABS DURING AN EPISODE OF CHEST PAIN (Patient taking differently: Place 0.4 mg under the tongue every 5 (five) minutes as needed for chest pain. DO NOT TAKE MORE THAN 3 TABS DURING AN EPISODE OF CHEST PAIN)  . potassium chloride (K-DUR) 10 MEQ tablet Take 2 tablets (20 mEq total) by mouth 2 (two) times daily.  . prednisoLONE acetate (PRED FORTE) 1 % ophthalmic suspension Place 1 drop into both eyes 4 (four) times daily.  Marland Kitchen tiZANidine (ZANAFLEX) 4 MG tablet Take 1 tablet (4 mg total) by mouth every 6 (six) hours as needed for muscle spasms.  Marland Kitchen topiramate (TOPAMAX) 50 MG tablet Take 1 tablet (50 mg total) by mouth 2 (two) times daily.  . TRUEPLUS PEN NEEDLES 32G X 4 MM MISC USE AS DIRECTED WITH INSULIN   No facility-administered encounter medications on file as of 01/15/2020.      Observations/Objective: Results for orders placed or performed in visit on 12/21/19  TSH  Result Value Ref Range   TSH 1.050 0.450 - 4.500 uIU/mL  CK  Result Value Ref Range   Total CK 123 32.0 - 182.0 U/L  C-reactive protein  Result Value Ref Range   CRP 20 (H) 0 - 10 mg/L  RPR  Result Value Ref Range   RPR Ser Ql Non Reactive Non Reactive  Vitamin B12  Result Value Ref Range   Vitamin B-12 828 232 - 1,245 pg/mL  Sedimentation rate  Result Value Ref Range   Sed Rate 26 0 - 40 mm/hr  ANA w/Reflex  if Positive  Result Value Ref Range   Anti Nuclear Antibody (ANA) Negative Negative   Lab Results  Component Value Date   HGBA1C 9.4 (A) 11/12/2019     Assessment and Plan: 1. Type 2 diabetes mellitus with other circulatory complication, with long-term current use of insulin (HCC) Blood sugar not at goal. Recommend increasing Farxiga to 10 mg daily and Lantus to 60 units daily. Dietary counseling given.  Gave some advice about fruits.  Logic fruits like melon and cantaloupe 10 to have higher glycemic index versus small fruits like apples, berries, raspberries have lower glycemic index.  Exceptions bananas and grapes. Encouraged her to try to get in some exercise indoors.  She can try walking in place for 10 to 15 minutes 3 to 4 days a week while watching a program on TV. - dapagliflozin propanediol (FARXIGA) 10 MG TABS tablet; Take 1 tablet (  10 mg total) by mouth daily before breakfast.  Dispense: 30 tablet; Refill: 6 - insulin glargine (LANTUS SOLOSTAR) 100 UNIT/ML Solostar Pen; INJECT 60 UNITS INTO THE SKIN DAILY.  Dispense: 45 mL; Refill: 6  2. Migraine without aura and without status migrainosus, not intractable Continue Fioricet as needed.  Advised patient that it should not be taken every day as it can become habit forming.  3. Coronary artery disease involving native coronary artery of native heart without angina pectoris Stable.  Continue current medications including aspirin, beta-blocker and atorvastatin.  4. Hypokalemia Refill sent on potassium supplement.  She will come to the lab to have a BMP - Basic Metabolic Panel; Future  5. Encounter for screening mammogram for malignant neoplasm of breast - MM Digital Screening; Future  6. Educated about COVID-19 virus infection Strongly encourage patient to get the COVID-19 vaccine as soon as possible.  Discussed with her that the vaccines are relatively safe and effective.   Follow Up Instructions: 3 mths with me Follow-up  with clinical pharmacist in 1 month for recheck of blood sugar   I discussed the assessment and treatment plan with the patient. The patient was provided an opportunity to ask questions and all were answered. The patient agreed with the plan and demonstrated an understanding of the instructions.   The patient was advised to call back or seek an in-person evaluation if the symptoms worsen or if the condition fails to improve as anticipated.  I provided 20 minutes of non-face-to-face time during this encounter.   Karle Plumber, MD

## 2020-01-16 ENCOUNTER — Telehealth: Payer: Self-pay | Admitting: Internal Medicine

## 2020-01-16 LAB — BASIC METABOLIC PANEL
BUN/Creatinine Ratio: 17 (ref 12–28)
BUN: 19 mg/dL (ref 8–27)
CO2: 26 mmol/L (ref 20–29)
Calcium: 9.3 mg/dL (ref 8.7–10.3)
Chloride: 102 mmol/L (ref 96–106)
Creatinine, Ser: 1.13 mg/dL — ABNORMAL HIGH (ref 0.57–1.00)
GFR calc Af Amer: 61 mL/min/{1.73_m2} (ref >59)
GFR calc non Af Amer: 53 mL/min/{1.73_m2} — ABNORMAL LOW (ref >59)
Glucose: 201 mg/dL — ABNORMAL HIGH (ref 65–99)
Potassium: 3.5 mmol/L (ref 3.5–5.2)
Sodium: 142 mmol/L (ref 134–144)

## 2020-01-16 MED ORDER — POTASSIUM CHLORIDE ER 10 MEQ PO TBCR: 10.0000 meq | EXTENDED_RELEASE_TABLET | Freq: Every day | ORAL | 5 refills | Status: DC

## 2020-01-16 NOTE — Telephone Encounter (Signed)
Phone call placed to patient today with the results of her potassium.  I informed patient that her potassium level was 3.5 which is in the low normal range.  I did some investigation to determine whether patient was really taking potassium 10 mEq 2 tablets in the morning and 2 tablets in the evening.  I check with our pharmacy.  Last refills that she received from our pharmacy was 5 04/2019 and 1217/2020 both for 33-month supply.  [facility]  She did a refill there in December for 2 weeks supply, in August 24, 2019 for 1 month supply and June 16 for 2-week supply.  Based on refills, it appears that patient was not taking this medicine consistently as she had indicated on our recent telephone encounter.  I asked her about this.  She admitted that she was not taking 2 tablets in the morning and 2 tablets in the evening.  She states that some days she would take 1 tablet a day or 2 tablets a day.  I told her that based on the results and the fact that she has been out of the potassium supplement for 8 days, she does not need to be restarted on 20 mg twice a day.  Instead I have sent a prescription for 10 mEq 1 tablet daily.  Patient also admitted that she does not take the NovoLog consistently because she does not always remember.  She may take it once a day but is consistent with taking the Lantus daily.  Again I encouraged her to take the NovoLog with meals.

## 2020-01-22 NOTE — Progress Notes (Signed)
[facility] Note  01/23/2020     CHIEF COMPLAINT Patient presents for Retina Follow Up   HISTORY OF PRESENT ILLNESS: Kimberly Frazier is a 59 y.o. female who presents to the clinic today for:   HPI    Retina Follow Up    Patient presents with  Diabetic Retinopathy.  In both eyes.  This started 4 weeks ago.  Severity is moderate.  I, the attending physician,  performed the HPI with the patient and updated documentation appropriately.          Comments    Patient  Here for 4 weeks retina follow up for NPDR OU. Patient states vision doing fine. On occasion has eye pain. Blood sugar 200 this am. Doesn't know A1C.       Last edited by Bernarda Caffey, MD on 01/24/2020 12:02 PM. (History)    VA OS blurred today.  Ran out of PF and Ketorolac.  Referring physician: Ladell Pier, MD [place],  [place] [address]  HISTORICAL INFORMATION:   Selected notes from the MEDICAL RECORD NUMBER Referred by Dr. Rutherford Guys for concern of CME   CURRENT MEDICATIONS: Current Outpatient Medications (Ophthalmic Drugs)  Medication Sig  . Bromfenac Sodium (PROLENSA) 0.07 % SOLN Place 1 drop into both eyes 4 (four) times daily.  Marland Kitchen ketorolac (ACULAR) 0.5 % ophthalmic solution Place 1 drop into both eyes 4 (four) times daily.  . prednisoLONE acetate (PRED FORTE) 1 % ophthalmic suspension Place 1 drop into both eyes 4 (four) times daily.   No current facility-administered medications for this visit. (Ophthalmic Drugs)   Current Outpatient Medications (Other)  Medication Sig  . Accu-Chek Softclix Lancets lancets Use to check blood sugar 3 times daily.  Marland Kitchen albuterol (VENTOLIN HFA) 108 (90 Base) MCG/ACT inhaler Inhale 2 puffs into the lungs every 6 (six) hours as needed for wheezing or shortness of breath.  Marland Kitchen amLODipine (NORVASC) 5 MG tablet TAKE 1 TABLET (5 MG TOTAL) BY MOUTH DAILY.  Marland Kitchen aspirin EC 81 MG tablet Take 1 tablet (81 mg total) daily by mouth.  Marland Kitchen  atorvastatin (LIPITOR) 40 MG tablet TAKE 1 TABLET DAILY BY MOUTH.  . Blood Glucose Monitoring Suppl (ACCU-CHEK GUIDE) w/Device KIT 1 kit by Does not apply route in the morning, at noon, and at bedtime. Use to check blood sugar 3 times daily.  . butalbital-acetaminophen-caffeine (FIORICET) 50-325-40 MG tablet Take 1 tablet by mouth every 6 (six) hours as needed for headache.  . carvedilol (COREG) 25 MG tablet TAKE 1 TABLET (25 MG TOTAL) BY MOUTH 2 (TWO) TIMES DAILY WITH A MEAL.  . dapagliflozin propanediol (FARXIGA) 10 MG TABS tablet Take 1 tablet (10 mg total) by mouth daily before breakfast.  . glucose blood (ACCU-CHEK GUIDE) test strip Use to check blood sugar 3 times daily.  . insulin aspart (NOVOLOG) 100 UNIT/ML injection Inject 28 Units into the skin 3 (three) times daily before meals.  . insulin glargine (LANTUS SOLOSTAR) 100 UNIT/ML Solostar Pen INJECT 60 UNITS INTO THE SKIN DAILY.  Marland Kitchen Insulin Syringe-Needle U-100 (TRUEPLUS INSULIN SYRINGE) 31G X 5/16" 0.3 ML MISC Use to inject Humalog three times daily.  . isosorbide mononitrate (IMDUR) 60 MG 24 hr tablet Take 1 tablet (60 mg total) by mouth daily.  . nitroGLYCERIN (NITROSTAT) 0.4 MG SL tablet Place 1 tab under tongue for chest pain.  May repeat after 5 minutes x 2.  DO NOT TAKE MORE THAN 3 TABS DURING AN EPISODE  OF CHEST PAIN (Patient taking differently: Place 0.4 mg under the tongue every 5 (five) minutes as needed for chest pain. DO NOT TAKE MORE THAN 3 TABS DURING AN EPISODE OF CHEST PAIN)  . potassium chloride (KLOR-CON) 10 MEQ tablet Take 1 tablet (10 mEq total) by mouth daily.  Marland Kitchen tiZANidine (ZANAFLEX) 4 MG tablet Take 1 tablet (4 mg total) by mouth every 6 (six) hours as needed for muscle spasms.  Marland Kitchen topiramate (TOPAMAX) 50 MG tablet Take 1 tablet (50 mg total) by mouth 2 (two) times daily.  . TRUEPLUS PEN NEEDLES 32G X 4 MM MISC USE AS DIRECTED WITH INSULIN   No current facility-administered medications for this visit. (Other)       REVIEW OF SYSTEMS: ROS    Positive for: Endocrine, Eyes   Negative for: Constitutional, Gastrointestinal, Neurological, Skin, Genitourinary, Musculoskeletal, HENT, Cardiovascular, Respiratory, Psychiatric, Allergic/Imm, Heme/Lymph   Last edited by Theodore Demark, COA on 01/23/2020  2:56 PM. (History)       ALLERGIES No Known Allergies  PAST MEDICAL HISTORY Past Medical History:  Diagnosis Date  . Adrenal adenoma, left   . Arthritis   . Cataract    Bilateral  . CKD (chronic kidney disease), stage II   . Coronary artery disease 07-28-2018 followed [facility]  currently due to no insurance   per cardiac cath 02-06-2015 (positive mild lateral ishcemia on stress test)--- dLAD 80%,  mLAD 40%,  mPDA 80% (small vessel),  ostial D1 70%,  CFx with lumial irregarlities-- medical management  . Depression   . Diabetic neuropathy ([place])   . Headache   . History of Bell's palsy 07/2011   per pt residual facial pain on left side occasionally  . History of cancer of vagina 1999   per pt completed radiation and chemo  . History of sepsis 12/30/2017   positive blood culter fro E.coli  . Hyperlipidemia   . Hypertension   . Hypokalemia   . Insulin dependent type 2 diabetes mellitus, uncontrolled ([place])    followed by pcp---  A1c was 11.7 on 06-15-2018 in epic  . Myocardial infarction ([place])   . Nocturia   . Peripheral neuropathy   . PMB (postmenopausal bleeding)   . Wears dentures    upper  . Wears glasses    Past Surgical History:  Procedure Laterality Date  . BREAST BIOPSY  2012   benign  . CARDIAC CATHETERIZATION N/A 02/06/2015   Procedure: Left Heart Cath and Coronary Angiography;  Surgeon: Larey Dresser, MD;  Location: [facility];  Service: Cardiovascular;  Laterality: N/A;  . CATARACT EXTRACTION, BILATERAL    . HYSTEROSCOPY WITH D & C N/A 08/01/2018   Procedure: DILATATION AND CURETTAGE /HYSTEROSCOPY;  Surgeon: Mora Bellman, MD;  Location:  [facility];  Service: Gynecology;  Laterality: N/A;  . ROBOTIC ASSISTED TOTAL HYSTERECTOMY WITH BILATERAL SALPINGO OOPHERECTOMY N/A 11/28/2018   Procedure: XI ROBOTIC ASSISTED TOTAL HYSTERECTOMY WITH BILATERAL SALPINGO OOPHORECTOMY;  Surgeon: Everitt Amber, MD;  Location: [facility];  Service: Gynecology;  Laterality: N/A;  . SENTINEL NODE BIOPSY N/A 11/28/2018   Procedure: SENTINEL NODE BIOPSY;  Surgeon: Everitt Amber, MD;  Location: [facility];  Service: Gynecology;  Laterality: N/A;  . UMBILICAL HERNIA REPAIR  child    FAMILY HISTORY Family History  Problem Relation Age of Onset  . Heart disease Mother   . Hypertension Mother   . Diabetes Mother   . Cancer Father  hodgkins lymphoma  . Heart disease Sister        heart attack  . Diabetes Sister   . Cancer Other        parent  . Diabetes Other        parent  . Heart disease Other        parent  . Hyperlipidemia Other        parent  . Hypertension Other        parent  . Arthritis Other        parent  . Diabetes Maternal Aunt   . Diabetes Maternal Uncle   . Breast cancer Neg Hx     SOCIAL HISTORY Social History   Tobacco Use  . Smoking status: Never Smoker  . Smokeless tobacco: Never Used  Vaping Use  . Vaping Use: Never used  Substance Use Topics  . Alcohol use: Not Currently  . Drug use: Not Currently    Types: Marijuana    Comment: 07-28-2018  per pt last smoked 11/21/2018         OPHTHALMIC EXAM:  Base Eye Exam    Visual Acuity (Snellen - Linear)      Right Left   Dist [place] 20/25 -2 20/400 -1   Dist ph [place] NI 20/350 -1       Tonometry (Tonopen, 2:53 PM)      Right Left   Pressure 20 18       Pupils      Dark Light Shape React APD   Right 3 2 Round Brisk None   Left 3 2 Round Brisk None       Visual Fields (Counting fingers)      Left Right    Full Full       Extraocular Movement      Right Left    Full, Ortho Full, Ortho       Neuro/Psych    Oriented x3: Yes   Mood/Affect:  Normal       Dilation    Both eyes: 1.0% Mydriacyl, 2.5% Phenylephrine @ 2:52 PM        Slit Lamp and Fundus Exam    Slit Lamp Exam      Right Left   Lids/Lashes Dermatochalasis - upper lid Dermatochalasis - upper lid   Conjunctiva/Sclera Mild Melanosis Mild Melanosis   Cornea 1+ Punctate epithelial erosions 1+ Punctate epithelial erosions, well healed temporal cataract wounds   Anterior Chamber Deep, 0.5+cell/pigment Deep, 0.5+cell/pigment   Iris Round and dilated, No NVI Round and dilated, No NVI   Lens PC IOL in good position with trace PCO PC IOL in good position with trace PCO   Vitreous Vitreous syneresis Vitreous syneresis       Fundus Exam      Right Left   Disc Pallor, Sharp rim Compact, Pink and Sharp   C/D Ratio 0.1 0.0   Macula Good foveal reflex, +central edema - slightly improved, +Microaneurysms greatest inferiorly and nasally--improving Blunted foveal reflex, central edema - slightly improved, +edudate/MA/IRH   Vessels Vascular attenuation, Tortuous Vascular attenuation, Tortuous, +venous beading, AV crossing changes   Periphery Attached, scattered drusen, scattered IRH Attached, scattered DBH greatest posteriorly -- improving          IMAGING AND PROCEDURES  Imaging and Procedures for @TODAY @  OCT, Retina - OU - Both Eyes       Right Eye Quality was good. Central Foveal Thickness: 304. Progression has improved. Findings include abnormal foveal contour, intraretinal fluid,  no SRF, intraretinal hyper-reflective material, vitreomacular adhesion  (Interval improvement in inferior IRF).   Left Eye Quality was good. Central Foveal Thickness: 311. Progression has improved. Findings include abnormal foveal contour, intraretinal fluid, subretinal fluid, intraretinal hyper-reflective material, vitreomacular adhesion  (Mild interval improvement in IRF and foveal profile).   Notes *Images captured and stored on drive  Diagnosis / Impression:  Macular edema OU -  likely combination of CME + DME OU OD: Interval improvement in inferior IRF OS: Mild interval improvement in IRF and foveal profile  Clinical management:  See below  Abbreviations: NFP - Normal foveal profile. CME - cystoid macular edema. PED - pigment epithelial detachment. IRF - intraretinal fluid. SRF - subretinal fluid. EZ - ellipsoid zone. ERM - epiretinal membrane. ORA - outer retinal atrophy. ORT - outer retinal tubulation. SRHM - subretinal hyper-reflective material        Intravitreal Injection, Pharmacologic Agent - OD - Right Eye       Time Out 01/23/2020. 3:43 PM. Confirmed correct patient, procedure, site, and patient consented.   Anesthesia Topical anesthesia was used. Anesthetic medications included Lidocaine 2%, Proparacaine 0.5%.   Procedure Preparation included 5% betadine to ocular surface, eyelid speculum. A supplied needle was used.   Injection:  1.25 mg Bevacizumab (AVASTIN) SOLN   NDC: 08657-846-96, Lot: [id number] , Expiration date: 03/16/2020   Route: Intravitreal, Site: Right Eye, Waste: 0 mL  Post-op Post injection exam found visual acuity of at least counting fingers. The patient tolerated the procedure well. There were no complications. The patient received written and verbal post procedure care education. Post injection medications were not given.        Intravitreal Injection, Pharmacologic Agent - OS - Left Eye       Time Out 01/23/2020. 3:43 PM. Confirmed correct patient, procedure, site, and patient consented.   Anesthesia Topical anesthesia was used. Anesthetic medications included Lidocaine 2%, Proparacaine 0.5%.   Procedure Preparation included 5% betadine to ocular surface, eyelid speculum. A (32g) needle was used.   Injection:  2 mg aflibercept 02/05/2020) SOLN   NDC: Alfonse Flavors, Lot: [id number], Expiration date: 03/31/2020   Route: Intravitreal, Site: Left Eye, Waste: 0.05 mL  Post-op Post injection exam found visual acuity of at  least counting fingers. The patient tolerated the procedure well. There were no complications. The patient received written and verbal post procedure care education. Post injection medications were not given.                 ASSESSMENT/PLAN:    ICD-10-CM   1. Moderate nonproliferative diabetic retinopathy of both eyes with macular edema associated with type 2 diabetes mellitus (HCC)  24/06/2019 Intravitreal Injection, Pharmacologic Agent - OD - Right Eye    Intravitreal Injection, Pharmacologic Agent - OS - Left Eye    aflibercept (EYLEA) SOLN 2 mg    Bevacizumab (AVASTIN) SOLN 1.25 mg  2. CME (cystoid macular edema), bilateral  H35.353   3. Retinal edema  H35.81 OCT, Retina - OU - Both Eyes  4. Essential hypertension  I10   5. Hypertensive retinopathy of both eyes  H35.033   6. Pseudophakia of both eyes  Z96.1     1-3. Moderate Non-proliferative diabetic retinopathy, OU OD with combination DME/CME  - exam shows scattered IRH, edema and tortuous blood vessels OU  - FA (10.23.20) shows leaking MA OU and paracentral petaloid hyperfluorecence OD --> CME/[place]  - OCT shows diabetic macular edema, OU; OD with CME/[place] component contributing as well  - s/p IVA  OS #1 (10.23.20), #2 (11.18.20), #3 (12.18.20), #4 (02.24.21), #5 (03.24.21)  - s/p IVA OD #1 (11.06.20), #2 (12.18.20), #3 (02.24.21), #4 (03.24.21), #5 (04.21.21), #6 (5.26.21), #7 (07.26.21)  - s/p IVE OS #1 (04.21.21), # 2 (5.26.21), #3 (07.26.21)  - BCVA OD 20/25; OS 20/350  - OCT today, 8.25.21 OD: Interval improvement in inferior IRF                                                OS: Mild interval improvement in IRF and foveal profile  - recommend IVA OD #8 and IVE #4 OS 8.25.21  - pt wishes to proceed  - RBA of procedure discussed, questions answered  - informed consent obtained  - Avastin informed consent form signed and scanned on 12.18.2020  - Eylea OS informed consent form signed and scanned on  4.21.2021  - see procedure note  - Eylea4U benefits investigation started, 04.21.21  - cont Pred Forte and Ketorolac QID OU  - f/u in 4 weeks, DFE, OCT, possible injection OU  4,5. Hypertensive retinopathy OU  - discussed importance of tight BP control  - monitor  6. Pseudophakia OU  - s/p CE/IOL OU Gershon Crane)   OD: 03/07/2019   OS: 02/28/2019  - IOLs in good position  - CME OU as above  - monitor  Ophthalmic Meds Ordered this visit:  Meds ordered this encounter  Medications  . prednisoLONE acetate (PRED FORTE) 1 % ophthalmic suspension    Sig: Place 1 drop into both eyes 4 (four) times daily.    Dispense:  10 mL    Refill:  4  . ketorolac (ACULAR) 0.5 % ophthalmic solution    Sig: Place 1 drop into both eyes 4 (four) times daily.    Dispense:  10 mL    Refill:  4  . aflibercept (EYLEA) SOLN 2 mg  . Bevacizumab (AVASTIN) SOLN 1.25 mg       Return in about 4 weeks (around 02/20/2020) for 4 wk f/u for NPDR OU w/DFE&OCT/likely inj..  There are no Patient Instructions on file for this visit.   Explained the diagnoses, plan, and follow up with the patient and they expressed understanding.  Patient expressed understanding of the importance of proper follow up care.   This document serves as a record of services personally performed by Gardiner Sleeper, MD, PhD. It was created on their behalf by Roselee Nova, COMT. The creation of this record is the provider's dictation and/or activities during the visit.  Electronically signed by: Roselee Nova, COMT 01/24/20 12:07 PM  This document serves as a record of services personally performed by Gardiner Sleeper, MD, PhD. It was created on their behalf by Estill Bakes, COT an ophthalmic technician. The creation of this record is the provider's dictation and/or activities during the visit.    Electronically signed by: Estill Bakes, COT 8.25.21 @ 12:07 PM  Gardiner Sleeper, M.D., Ph.D. Diseases & Surgery of the Retina and  [place] 8.25.21  I have reviewed the above documentation for accuracy and completeness, and I agree with the above. Gardiner Sleeper, M.D., Ph.D. 01/24/20 12:07 PM    Abbreviations: M myopia (nearsighted); A astigmatism; H hyperopia (farsighted); P presbyopia; Mrx spectacle prescription;  CTL contact lenses; OD right eye; OS left eye; OU both eyes  XT exotropia;  ET esotropia; PEK punctate epithelial keratitis; PEE punctate epithelial erosions; DES dry eye syndrome; MGD meibomian gland dysfunction; ATs artificial tears; PFAT's preservative free artificial tears; [place] nuclear sclerotic cataract; PSC posterior subcapsular cataract; ERM epi-retinal membrane; PVD posterior vitreous detachment; RD retinal detachment; DM diabetes mellitus; DR diabetic retinopathy; NPDR non-proliferative diabetic retinopathy; PDR proliferative diabetic retinopathy; CSME clinically significant macular edema; DME diabetic macular edema; dbh dot blot hemorrhages; CWS cotton wool spot; POAG primary open angle glaucoma; C/D cup-to-disc ratio; HVF humphrey visual field; GVF goldmann visual field; OCT optical coherence tomography; IOP intraocular pressure; BRVO Branch retinal vein occlusion; CRVO central retinal vein occlusion; CRAO central retinal artery occlusion; BRAO branch retinal artery occlusion; RT retinal tear; SB scleral buckle; PPV pars plana vitrectomy; VH Vitreous hemorrhage; PRP panretinal laser photocoagulation; IVK intravitreal kenalog; VMT vitreomacular traction; MH Macular hole;  NVD neovascularization of the disc; NVE neovascularization elsewhere; AREDS age related eye disease study; ARMD age related macular degeneration; POAG primary open angle glaucoma; EBMD epithelial/anterior basement membrane dystrophy; ACIOL anterior chamber intraocular lens; IOL intraocular lens; PCIOL posterior chamber intraocular lens; Phaco/IOL phacoemulsification with intraocular lens placement; [place]  photorefractive keratectomy; LASIK laser assisted in situ keratomileusis; HTN hypertension; DM diabetes mellitus; COPD chronic obstructive pulmonary disease

## 2020-01-23 ENCOUNTER — Ambulatory Visit (INDEPENDENT_AMBULATORY_CARE_PROVIDER_SITE_OTHER): Payer: Medicare Other | Admitting: Ophthalmology

## 2020-01-23 ENCOUNTER — Encounter (INDEPENDENT_AMBULATORY_CARE_PROVIDER_SITE_OTHER): Payer: Self-pay | Admitting: Ophthalmology

## 2020-01-23 ENCOUNTER — Other Ambulatory Visit: Payer: Self-pay

## 2020-01-23 DIAGNOSIS — H35033 Hypertensive retinopathy, bilateral: Secondary | ICD-10-CM

## 2020-01-23 DIAGNOSIS — H3581 Retinal edema: Secondary | ICD-10-CM

## 2020-01-23 DIAGNOSIS — H35353 Cystoid macular degeneration, bilateral: Secondary | ICD-10-CM | POA: Diagnosis not present

## 2020-01-23 DIAGNOSIS — I10 Essential (primary) hypertension: Secondary | ICD-10-CM

## 2020-01-23 DIAGNOSIS — E113313 Type 2 diabetes mellitus with moderate nonproliferative diabetic retinopathy with macular edema, bilateral: Secondary | ICD-10-CM | POA: Diagnosis not present

## 2020-01-23 DIAGNOSIS — Z961 Presence of intraocular lens: Secondary | ICD-10-CM

## 2020-01-23 MED ORDER — PREDNISOLONE ACETATE 1 % OP SUSP: 1.0000 [drp] | Freq: Four times a day (QID) | OPHTHALMIC | 4 refills | Status: DC

## 2020-01-23 MED ORDER — KETOROLAC TROMETHAMINE 0.5 % OP SOLN: 1.0000 [drp] | Freq: Four times a day (QID) | OPHTHALMIC | 4 refills | Status: DC

## 2020-01-23 MED FILL — KETOROLAC 0.5% OPHTH SOLN: 0.5 | 18 days supply | Qty: 10 | Fill #0

## 2020-01-23 MED FILL — PREDNISOLONE AC 1% EYE DROP: 1 | 18 days supply | Qty: 10 | Fill #0

## 2020-01-24 ENCOUNTER — Encounter (INDEPENDENT_AMBULATORY_CARE_PROVIDER_SITE_OTHER): Payer: Self-pay | Admitting: Ophthalmology

## 2020-01-24 MED ORDER — BEVACIZUMAB CHEMO INJECTION 1.25MG/0.05ML SYRINGE FOR KALEIDOSCOPE
1.2500 mg | INTRAVITREAL | Status: AC | PRN
  Administered 2020-01-24: 1.25 mg via INTRAVITREAL

## 2020-01-24 MED ORDER — AFLIBERCEPT 2MG/0.05ML IZ SOLN FOR KALEIDOSCOPE
2.0000 mg | INTRAVITREAL | Status: AC | PRN
  Administered 2020-01-24: 2 mg via INTRAVITREAL

## 2020-01-28 MED FILL — LANTUS SOLOSTAR 100 UNITS/M: 100 | 27 days supply | Qty: 15 | Fill #3

## 2020-01-28 MED FILL — NovoLOG 100 UNIT/ML SOLN: 100 | 23 days supply | Qty: 20 | Fill #0

## 2020-02-06 ENCOUNTER — Ambulatory Visit: Payer: Medicare Other

## 2020-02-06 MED FILL — TRUEPLUS SYR 0.3ML 31GX5/16: 31G X 5/16" | 30 days supply | Qty: 100 | Fill #1

## 2020-02-06 MED FILL — AMLODIPINE BESYLATE 5 MG TA: 5 | 30 days supply | Qty: 30 | Fill #2

## 2020-02-06 MED FILL — ATORVASTATIN CALCIUM 40 MG: 40 | 30 days supply | Qty: 30 | Fill #2

## 2020-02-13 ENCOUNTER — Other Ambulatory Visit: Payer: Self-pay | Admitting: Cardiology

## 2020-02-13 NOTE — Telephone Encounter (Signed)
Please advise if appropriate to refill, patient last had prescription filled 09/18/18, last office visit 12/12/2019. Medication at last office visit was still listed as actively taking and there was no medication changes made at that visit. KW

## 2020-02-14 ENCOUNTER — Other Ambulatory Visit: Payer: Self-pay | Admitting: Cardiology

## 2020-02-14 MED FILL — ISOSORBIDE MN ER 60 MG TAB: 60 | 90 days supply | Qty: 90 | Fill #0

## 2020-02-15 ENCOUNTER — Encounter: Payer: Self-pay | Admitting: Cardiology

## 2020-02-19 MED FILL — LANTUS SOLOSTAR 100 UNITS/M: 100 | 27 days supply | Qty: 15 | Fill #4

## 2020-02-20 ENCOUNTER — Encounter (INDEPENDENT_AMBULATORY_CARE_PROVIDER_SITE_OTHER): Payer: Medicare Other | Admitting: Ophthalmology

## 2020-02-20 NOTE — Progress Notes (Signed)
[facility] Note  02/21/2020     CHIEF COMPLAINT Patient presents for Retina Follow Up   HISTORY OF PRESENT ILLNESS: Kimberly Frazier is a 59 y.o. female who presents to the clinic today for:   HPI    Retina Follow Up    Patient presents with  Diabetic Retinopathy.  In both eyes.  This started months ago.  Severity is moderate.  Duration of 4 weeks.  Since onset it is gradually improving.  I, the attending physician,  performed the HPI with the patient and updated documentation appropriately.          Comments    59 y/o female pt here for 4 wk f/u for mod NPDR w/DME OU.  No change in [place] OU.  Denies pain, FOL, floaters.  Pred & Ketorolac QID OU.  BS 207 this a.m.  A1C "Above 7."       Last edited by Bernarda Caffey, MD on 02/21/2020  8:37 AM. (History)      Referring physician: Rutherford Guys, MD [address],  [place] [id number]  HISTORICAL INFORMATION:   Selected notes from the MEDICAL RECORD NUMBER Referred by Dr. Rutherford Guys for concern of CME   CURRENT MEDICATIONS: Current Outpatient Medications (Ophthalmic Drugs)  Medication Sig  . Bromfenac Sodium (PROLENSA) 0.07 % SOLN Place 1 drop into both eyes 4 (four) times daily.  Marland Kitchen ketorolac (ACULAR) 0.5 % ophthalmic solution Place 1 drop into both eyes 4 (four) times daily.  . prednisoLONE acetate (PRED FORTE) 1 % ophthalmic suspension Place 1 drop into both eyes 4 (four) times daily.   No current facility-administered medications for this visit. (Ophthalmic Drugs)   Current Outpatient Medications (Other)  Medication Sig  . Accu-Chek Softclix Lancets lancets Use to check blood sugar 3 times daily.  Marland Kitchen albuterol (VENTOLIN HFA) 108 (90 Base) MCG/ACT inhaler Inhale 2 puffs into the lungs every 6 (six) hours as needed for wheezing or shortness of breath.  Marland Kitchen amLODipine (NORVASC) 5 MG tablet TAKE 1 TABLET (5 MG TOTAL) BY MOUTH DAILY.  Marland Kitchen aspirin EC 81 MG tablet Take 1 tablet (81 mg total) daily  by mouth.  Marland Kitchen atorvastatin (LIPITOR) 40 MG tablet TAKE 1 TABLET DAILY BY MOUTH.  . Blood Glucose Monitoring Suppl (ACCU-CHEK GUIDE) w/Device KIT 1 kit by Does not apply route in the morning, at noon, and at bedtime. Use to check blood sugar 3 times daily.  . butalbital-acetaminophen-caffeine (FIORICET) 50-325-40 MG tablet Take 1 tablet by mouth every 6 (six) hours as needed for headache.  . carvedilol (COREG) 25 MG tablet TAKE 1 TABLET (25 MG TOTAL) BY MOUTH 2 (TWO) TIMES DAILY WITH A MEAL.  . dapagliflozin propanediol (FARXIGA) 10 MG TABS tablet Take 1 tablet (10 mg total) by mouth daily before breakfast.  . glucose blood (ACCU-CHEK GUIDE) test strip Use to check blood sugar 3 times daily.  . insulin aspart (NOVOLOG) 100 UNIT/ML injection Inject 28 Units into the skin 3 (three) times daily before meals.  . insulin glargine (LANTUS SOLOSTAR) 100 UNIT/ML Solostar Pen INJECT 60 UNITS INTO THE SKIN DAILY.  Marland Kitchen Insulin Syringe-Needle U-100 (TRUEPLUS INSULIN SYRINGE) 31G X 5/16" 0.3 ML MISC Use to inject Humalog three times daily.  . isosorbide mononitrate (IMDUR) 60 MG 24 hr tablet TAKE 1 TABLET (60 MG TOTAL) BY MOUTH DAILY.  . nitroGLYCERIN (NITROSTAT) 0.4 MG SL tablet Place 1 tab under tongue for chest pain.  May repeat after 5 minutes x 2.  DO NOT TAKE MORE THAN 3 TABS DURING AN EPISODE OF CHEST PAIN (Patient taking differently: Place 0.4 mg under the tongue every 5 (five) minutes as needed for chest pain. DO NOT TAKE MORE THAN 3 TABS DURING AN EPISODE OF CHEST PAIN)  . potassium chloride (KLOR-CON) 10 MEQ tablet Take 1 tablet (10 mEq total) by mouth daily.  Marland Kitchen tiZANidine (ZANAFLEX) 4 MG tablet Take 1 tablet (4 mg total) by mouth every 6 (six) hours as needed for muscle spasms.  Marland Kitchen topiramate (TOPAMAX) 50 MG tablet Take 1 tablet (50 mg total) by mouth 2 (two) times daily.  . TRUEPLUS PEN NEEDLES 32G X 4 MM MISC USE AS DIRECTED WITH INSULIN   No current facility-administered medications for this visit.  (Other)      REVIEW OF SYSTEMS: ROS    Positive for: Endocrine, Cardiovascular, Eyes   Negative for: Constitutional, Gastrointestinal, Neurological, Skin, Genitourinary, Musculoskeletal, HENT, Respiratory, Psychiatric, Allergic/Imm, Heme/Lymph   Last edited by Matthew Folks, COA on 02/21/2020  8:16 AM. (History)       ALLERGIES No Known Allergies  PAST MEDICAL HISTORY Past Medical History:  Diagnosis Date  . Adrenal adenoma, left   . Arthritis   . CKD (chronic kidney disease), stage II   . Coronary artery disease 07-28-2018 [facility]  currently due to no insurance   per cardiac cath 02-06-2015 (positive mild lateral ishcemia on stress test)--- dLAD 80%,  mLAD 40%,  mPDA 80% (small vessel),  ostial D1 70%,  CFx with lumial irregarlities-- medical management  . Depression   . Diabetic neuropathy ([place])   . Diabetic retinopathy ([place])    NPDR OU  . Headache   . History of Bell's palsy 07/2011   per pt residual facial pain on left side occasionally  . History of cancer of vagina 1999   per pt completed radiation and chemo  . History of sepsis 12/30/2017   positive blood culter fro E.coli  . Hyperlipidemia   . Hypertension   . Hypertensive retinopathy    OU  . Hypokalemia   . Insulin dependent type 2 diabetes mellitus, uncontrolled ([place])    followed by pcp---  A1c was 11.7 on 06-15-2018 in epic  . Myocardial infarction ([place])   . Nocturia   . Peripheral neuropathy   . PMB (postmenopausal bleeding)   . Wears dentures    upper  . Wears glasses    Past Surgical History:  Procedure Laterality Date  . BREAST BIOPSY  2012   benign  . CARDIAC CATHETERIZATION N/A 02/06/2015   Procedure: Left Heart Cath and Coronary Angiography;  Surgeon: Larey Dresser, MD;  Location: [facility];  Service: Cardiovascular;  Laterality: N/A;  . CATARACT EXTRACTION Bilateral OD: 03/07/19, OS: 02/28/19   Dr. Gershon Crane  . CATARACT EXTRACTION, BILATERAL    .  EYE SURGERY Bilateral OD: 03/07/19, OS: 02/28/19   Cat Sx - Dr. Gershon Crane  . HYSTEROSCOPY WITH D & C N/A 08/01/2018   Procedure: DILATATION AND CURETTAGE /HYSTEROSCOPY;  Surgeon: Mora Bellman, MD;  Location: [facility];  Service: Gynecology;  Laterality: N/A;  . ROBOTIC ASSISTED TOTAL HYSTERECTOMY WITH BILATERAL SALPINGO OOPHERECTOMY N/A 11/28/2018   Procedure: XI ROBOTIC ASSISTED TOTAL HYSTERECTOMY WITH BILATERAL SALPINGO OOPHORECTOMY;  Surgeon: Everitt Amber, MD;  Location: [facility];  Service: Gynecology;  Laterality: N/A;  . SENTINEL NODE BIOPSY N/A 11/28/2018   Procedure: SENTINEL NODE BIOPSY;  Surgeon: Everitt Amber, MD;  Location: [facility];  Service:  Gynecology;  Laterality: N/A;  . UMBILICAL HERNIA REPAIR  child    FAMILY HISTORY Family History  Problem Relation Age of Onset  . Heart disease Mother   . Hypertension Mother   . Diabetes Mother   . Cancer Father        hodgkins lymphoma  . Heart disease Sister        heart attack  . Diabetes Sister   . Cancer Other        parent  . Diabetes Other        parent  . Heart disease Other        parent  . Hyperlipidemia Other        parent  . Hypertension Other        parent  . Arthritis Other        parent  . Diabetes Maternal Aunt   . Diabetes Maternal Uncle   . Breast cancer Neg Hx     SOCIAL HISTORY Social History   Tobacco Use  . Smoking status: Never Smoker  . Smokeless tobacco: Never Used  Vaping Use  . Vaping Use: Never used  Substance Use Topics  . Alcohol use: Not Currently  . Drug use: Not Currently    Types: Marijuana    Comment: 07-28-2018  per pt last smoked 11/21/2018         OPHTHALMIC EXAM:  Base Eye Exam    Visual Acuity (Snellen - Linear)      Right Left   Dist [place] 20/20 20/100 +2   Dist ph [place]  NI  Retested OS to 20/100 +2 by MD (CF originally)       Tonometry (Tonopen, 8:20 AM)      Right Left   Pressure 18 21       Pupils      Dark Light Shape React APD   Right 3 2 Round  Brisk None   Left 3 2 Round Brisk None       Visual Fields (Counting fingers)      Left Right    Full Full       Extraocular Movement      Right Left    Full, Ortho Full, Ortho       Neuro/Psych    Oriented x3: Yes   Mood/Affect: Normal       Dilation    Both eyes: 1.0% Mydriacyl, 2.5% Phenylephrine @ 8:20 AM        Slit Lamp and Fundus Exam    Slit Lamp Exam      Right Left   Lids/Lashes Dermatochalasis - upper lid Dermatochalasis - upper lid   Conjunctiva/Sclera Mild Melanosis Mild Melanosis   Cornea 1+ Punctate epithelial erosions 1+ Punctate epithelial erosions, well healed temporal cataract wounds   Anterior Chamber Deep, 0.5+cell/pigment Deep, 0.5+cell/pigment   Iris Round and dilated, No NVI Round and dilated, No NVI   Lens PC IOL in good position with trace PCO PC IOL in good position with trace PCO   Vitreous Vitreous syneresis Vitreous syneresis       Fundus Exam      Right Left   Disc Pallor, Sharp rim Compact, Pink and Sharp   C/D Ratio 0.1 0.0   Macula Good foveal reflex, +central edema - persistent, +Microaneurysms greatest inferiorly and nasally--improving Blunted foveal reflex, central edema - persistent, +edudate/MA/IRH   Vessels Vascular attenuation, Tortuous Vascular attenuation, Tortuous, +venous beading, AV crossing changes   Periphery Attached, scattered drusen, scattered IRH Attached,  scattered DBH greatest posteriorly -- improving          IMAGING AND PROCEDURES  Imaging and Procedures for _0 @  OCT, Retina - OU - Both Eyes       Right Eye Quality was good. Central Foveal Thickness: 299. Progression has been stable. Findings include abnormal foveal contour, intraretinal fluid, no SRF, intraretinal hyper-reflective material, vitreomacular adhesion  (Persistent IRF/IRHM).   Left Eye Quality was good. Central Foveal Thickness: 300. Progression has improved. Findings include abnormal foveal contour, intraretinal fluid, intraretinal  hyper-reflective material, vitreomacular adhesion , outer retinal atrophy, subretinal hyper-reflective material, no SRF (persistent IRF -- slightly improved).   Notes *Images captured and stored on drive  Diagnosis / Impression:  Macular edema OU - likely combination of CME + DME OU OD: Persistent IRF/IRHM OS: persistent IRF -- slightly improved  Clinical management:  See below  Abbreviations: NFP - Normal foveal profile. CME - cystoid macular edema. PED - pigment epithelial detachment. IRF - intraretinal fluid. SRF - subretinal fluid. EZ - ellipsoid zone. ERM - epiretinal membrane. ORA - outer retinal atrophy. ORT - outer retinal tubulation. SRHM - subretinal hyper-reflective material        Intravitreal Injection, Pharmacologic Agent - OD - Right Eye       Time Out 02/21/2020. 8:35 AM. Confirmed correct patient, procedure, site, and patient consented.   Anesthesia Topical anesthesia was used. Anesthetic medications included Lidocaine 2%, Proparacaine 0.5%.   Procedure Preparation included 5% betadine to ocular surface, eyelid speculum. A supplied needle was used.   Injection:  1.25 mg Bevacizumab (AVASTIN) SOLN   NDC: 22025-427-06, Lot: [id number] , Expiration date: 04/17/2020   Route: Intravitreal, Site: Right Eye, Waste: 0 mg  Post-op Post injection exam found visual acuity of at least counting fingers. The patient tolerated the procedure well. There were no complications. The patient received written and verbal post procedure care education. Post injection medications were not given.        Intravitreal Injection, Pharmacologic Agent - OS - Left Eye       Time Out 02/21/2020. 8:35 AM. Confirmed correct patient, procedure, site, and patient consented.   Anesthesia Topical anesthesia was used. Anesthetic medications included Lidocaine 2%, Proparacaine 0.5%.   Procedure Preparation included 5% betadine to ocular surface, eyelid speculum. A (32g) needle was used.    Injection:  2 mg aflibercept Alfonse Flavors) SOLN   NDC: A3590391, Lot: [id number], Expiration date: 04/30/2020   Route: Intravitreal, Site: Left Eye, Waste: 0.05 mL  Post-op Post injection exam found visual acuity of at least counting fingers. The patient tolerated the procedure well. There were no complications. The patient received written and verbal post procedure care education. Post injection medications were not given.                 ASSESSMENT/PLAN:    ICD-10-CM   1. Moderate nonproliferative diabetic retinopathy of both eyes with macular edema associated with type 2 diabetes mellitus (HCC)  V61.6073 Intravitreal Injection, Pharmacologic Agent - OD - Right Eye    Intravitreal Injection, Pharmacologic Agent - OS - Left Eye    aflibercept (EYLEA) SOLN 2 mg    Bevacizumab (AVASTIN) SOLN 1.25 mg  2. CME (cystoid macular edema), bilateral  H35.353   3. Retinal edema  H35.81 OCT, Retina - OU - Both Eyes  4. Essential hypertension  I10   5. Hypertensive retinopathy of both eyes  H35.033   6. Pseudophakia of both eyes  Z96.1     1-3. Moderate Non-proliferative diabetic  retinopathy, OU OD with combination DME/CME  - exam shows scattered IRH, edema and tortuous blood vessels OU  - FA (10.23.20) shows leaking MA OU and paracentral petaloid hyperfluorecence OD --> CME/[place]  - OCT shows diabetic macular edema, OU; OD with CME/[place] component contributing as well  - s/p IVA OS #1 (10.23.20), #2 (11.18.20), #3 (12.18.20), #4 (02.24.21), #5 (03.24.21)  - s/p IVA OD #1 (11.06.20), #2 (12.18.20), #3 (02.24.21), #4 (03.24.21), #5 (04.21.21), #6 (5.26.21), #7 (07.26.21), #8 (08.25.21)  - s/p IVE OS #1 (04.21.21), # 2 (5.26.21), #3 (07.26.21), #4 (08.25.21)  - BCVA OD 20/20; OS 20/100+2  - OCT today, 8.25.21 OD: persistent IRF/IRHM; OS: persistent IRF -- slightly improved  - recommend IVA OD #9 and IVE #5 OS 09.23.21  - pt wishes to proceed  - RBA of procedure discussed,  questions answered  - informed consent obtained  - Avastin informed consent form signed and scanned on 12.18.2020  - Eylea OS informed consent form signed and scanned on 4.21.2021  - see procedure note  - Eylea4U benefits investigation started, 04.21.21  - cont Pred Forte and Ketorolac QID OU  - f/u in 4 weeks, DFE, OCT, possible injection OU  4,5. Hypertensive retinopathy OU  - discussed importance of tight BP control  - monitor  6. Pseudophakia OU  - s/p CE/IOL OU Gershon Crane)   OD: 03/07/2019   OS: 02/28/2019  - IOLs in good position  - CME OU as above  - monitor  Ophthalmic Meds Ordered this visit:  Meds ordered this encounter  Medications  . aflibercept (EYLEA) SOLN 2 mg  . Bevacizumab (AVASTIN) SOLN 1.25 mg       Return in about 4 weeks (around 03/20/2020) for Dilated Exam, OCT, Possible Injxn.  There are no Patient Instructions on file for this visit.   Explained the diagnoses, plan, and follow up with the patient and they expressed understanding.  Patient expressed understanding of the importance of proper follow up care.   This document serves as a record of services personally performed by Gardiner Sleeper, MD, PhD. It was created on their behalf by Leonie Douglas, an ophthalmic technician. The creation of this record is the provider's dictation and/or activities during the visit.    Electronically signed by: Leonie Douglas COA, 02/21/20  9:03 AM   This document serves as a record of services personally performed by Gardiner Sleeper, MD, PhD. It was created on their behalf by San Jetty. Owens Shark, OA an ophthalmic technician. The creation of this record is the provider's dictation and/or activities during the visit.    Electronically signed by: San Jetty. Owens Shark, [place] 09.23.2021 9:03 AM  Gardiner Sleeper, M.D., Ph.D. Diseases & Surgery of the Retina and Vitreous [facility]  I have reviewed the above documentation for accuracy and completeness, and I  agree with the above. Gardiner Sleeper, M.D., Ph.D. 02/21/20 9:03 AM   Abbreviations: M myopia (nearsighted); A astigmatism; H hyperopia (farsighted); P presbyopia; Mrx spectacle prescription;  CTL contact lenses; OD right eye; OS left eye; OU both eyes  XT exotropia; ET esotropia; PEK punctate epithelial keratitis; PEE punctate epithelial erosions; DES dry eye syndrome; MGD meibomian gland dysfunction; ATs artificial tears; PFAT's preservative free artificial tears; [place] nuclear sclerotic cataract; PSC posterior subcapsular cataract; ERM epi-retinal membrane; PVD posterior vitreous detachment; RD retinal detachment; DM diabetes mellitus; DR diabetic retinopathy; NPDR non-proliferative diabetic retinopathy; PDR proliferative diabetic retinopathy; CSME clinically significant macular edema; DME diabetic macular edema;  dbh dot blot hemorrhages; CWS cotton wool spot; POAG primary open angle glaucoma; C/D cup-to-disc ratio; HVF humphrey visual field; GVF goldmann visual field; OCT optical coherence tomography; IOP intraocular pressure; BRVO Branch retinal vein occlusion; CRVO central retinal vein occlusion; CRAO central retinal artery occlusion; BRAO branch retinal artery occlusion; RT retinal tear; SB scleral buckle; PPV pars plana vitrectomy; VH Vitreous hemorrhage; PRP panretinal laser photocoagulation; IVK intravitreal kenalog; VMT vitreomacular traction; MH Macular hole;  NVD neovascularization of the disc; NVE neovascularization elsewhere; AREDS age related eye disease study; ARMD age related macular degeneration; POAG primary open angle glaucoma; EBMD epithelial/anterior basement membrane dystrophy; ACIOL anterior chamber intraocular lens; IOL intraocular lens; PCIOL posterior chamber intraocular lens; Phaco/IOL phacoemulsification with intraocular lens placement; [place] photorefractive keratectomy; LASIK laser assisted in situ keratomileusis; HTN hypertension; DM diabetes mellitus; COPD chronic obstructive  pulmonary disease

## 2020-02-21 ENCOUNTER — Telehealth: Payer: Self-pay

## 2020-02-21 ENCOUNTER — Ambulatory Visit (INDEPENDENT_AMBULATORY_CARE_PROVIDER_SITE_OTHER): Payer: Medicare Other | Admitting: Ophthalmology

## 2020-02-21 ENCOUNTER — Other Ambulatory Visit: Payer: Self-pay

## 2020-02-21 ENCOUNTER — Encounter (INDEPENDENT_AMBULATORY_CARE_PROVIDER_SITE_OTHER): Payer: Self-pay | Admitting: Ophthalmology

## 2020-02-21 DIAGNOSIS — E113313 Type 2 diabetes mellitus with moderate nonproliferative diabetic retinopathy with macular edema, bilateral: Secondary | ICD-10-CM

## 2020-02-21 DIAGNOSIS — I10 Essential (primary) hypertension: Secondary | ICD-10-CM | POA: Diagnosis not present

## 2020-02-21 DIAGNOSIS — H3581 Retinal edema: Secondary | ICD-10-CM | POA: Diagnosis not present

## 2020-02-21 DIAGNOSIS — H35353 Cystoid macular degeneration, bilateral: Secondary | ICD-10-CM

## 2020-02-21 DIAGNOSIS — Z961 Presence of intraocular lens: Secondary | ICD-10-CM

## 2020-02-21 DIAGNOSIS — H35033 Hypertensive retinopathy, bilateral: Secondary | ICD-10-CM

## 2020-02-21 MED ORDER — BEVACIZUMAB CHEMO INJECTION 1.25MG/0.05ML SYRINGE FOR KALEIDOSCOPE
1.2500 mg | INTRAVITREAL | Status: AC | PRN
  Administered 2020-02-21: 1.25 mg via INTRAVITREAL

## 2020-02-21 MED ORDER — EMPAGLIFLOZIN 10 MG PO TABS: 10.0000 mg | ORAL_TABLET | Freq: Every day | ORAL | 4 refills | Status: DC

## 2020-02-21 MED ORDER — AFLIBERCEPT 2MG/0.05ML IZ SOLN FOR KALEIDOSCOPE
2.0000 mg | INTRAVITREAL | Status: AC | PRN
  Administered 2020-02-21: 2 mg via INTRAVITREAL

## 2020-02-21 NOTE — Addendum Note (Signed)
Addended by: Karle Plumber B on: 02/21/2020 09:25 PM   Modules accepted: Orders

## 2020-02-21 NOTE — Telephone Encounter (Signed)
Patients ins now prefers [place] or Kimberly Frazier as an alternative to [place].  Would either of the meds be an appropriate therapy change?  Could you send in a script if so?

## 2020-03-07 LAB — LIPID PANEL
Chol/HDL Ratio: 3.8 ratio (ref 0.0–4.4)
Cholesterol, Total: 156 mg/dL (ref 100–199)
HDL: 41 mg/dL (ref >39)
LDL Chol Calc (NIH): 92 mg/dL (ref 0–99)
Triglycerides: 130 mg/dL (ref 0–149)
VLDL Cholesterol Cal: 23 mg/dL (ref 5–40)

## 2020-03-13 ENCOUNTER — Ambulatory Visit: Payer: Medicare Other | Admitting: Cardiology

## 2020-03-14 ENCOUNTER — Other Ambulatory Visit: Payer: Medicare Other

## 2020-03-14 DIAGNOSIS — Z20822 Contact with and (suspected) exposure to covid-19: Secondary | ICD-10-CM

## 2020-03-15 LAB — SARS-COV-2, NAA 2 DAY TAT

## 2020-03-15 LAB — NOVEL CORONAVIRUS, NAA: SARS-CoV-2, NAA: NOT DETECTED

## 2020-03-20 ENCOUNTER — Encounter (INDEPENDENT_AMBULATORY_CARE_PROVIDER_SITE_OTHER): Payer: Medicare Other | Admitting: Ophthalmology

## 2020-03-20 DIAGNOSIS — H3581 Retinal edema: Secondary | ICD-10-CM

## 2020-03-20 DIAGNOSIS — H35033 Hypertensive retinopathy, bilateral: Secondary | ICD-10-CM

## 2020-03-20 DIAGNOSIS — I10 Essential (primary) hypertension: Secondary | ICD-10-CM

## 2020-03-20 DIAGNOSIS — Z961 Presence of intraocular lens: Secondary | ICD-10-CM

## 2020-03-20 DIAGNOSIS — E113313 Type 2 diabetes mellitus with moderate nonproliferative diabetic retinopathy with macular edema, bilateral: Secondary | ICD-10-CM

## 2020-03-20 DIAGNOSIS — H35353 Cystoid macular degeneration, bilateral: Secondary | ICD-10-CM

## 2020-03-24 ENCOUNTER — Emergency Department (HOSPITAL_COMMUNITY): Payer: Medicare Other

## 2020-03-24 ENCOUNTER — Other Ambulatory Visit: Payer: Self-pay

## 2020-03-24 ENCOUNTER — Telehealth: Payer: Self-pay | Admitting: Cardiology

## 2020-03-24 ENCOUNTER — Encounter (HOSPITAL_COMMUNITY): Payer: Self-pay | Admitting: Emergency Medicine

## 2020-03-24 ENCOUNTER — Emergency Department (HOSPITAL_COMMUNITY)
Admission: EM | Admit: 2020-03-24 | Discharge: 2020-03-24 | Disposition: A | Discharge status: Home / Self Care | Payer: Medicare Other | Attending: Emergency Medicine | Admitting: Emergency Medicine

## 2020-03-24 DIAGNOSIS — N183 Chronic kidney disease, stage 3 unspecified: Secondary | ICD-10-CM | POA: Diagnosis not present

## 2020-03-24 DIAGNOSIS — Z20822 Contact with and (suspected) exposure to covid-19: Secondary | ICD-10-CM | POA: Diagnosis not present

## 2020-03-24 DIAGNOSIS — Z8542 Personal history of malignant neoplasm of other parts of uterus: Secondary | ICD-10-CM | POA: Diagnosis not present

## 2020-03-24 DIAGNOSIS — R0602 Shortness of breath: Secondary | ICD-10-CM | POA: Diagnosis not present

## 2020-03-24 DIAGNOSIS — E1165 Type 2 diabetes mellitus with hyperglycemia: Secondary | ICD-10-CM | POA: Diagnosis not present

## 2020-03-24 DIAGNOSIS — I313 Pericardial effusion (noninflammatory): Secondary | ICD-10-CM | POA: Diagnosis not present

## 2020-03-24 DIAGNOSIS — I10 Essential (primary) hypertension: Secondary | ICD-10-CM

## 2020-03-24 DIAGNOSIS — Z7984 Long term (current) use of oral hypoglycemic drugs: Secondary | ICD-10-CM | POA: Diagnosis not present

## 2020-03-24 DIAGNOSIS — Z7982 Long term (current) use of aspirin: Secondary | ICD-10-CM | POA: Diagnosis not present

## 2020-03-24 DIAGNOSIS — E876 Hypokalemia: Secondary | ICD-10-CM | POA: Diagnosis not present

## 2020-03-24 DIAGNOSIS — Z79899 Other long term (current) drug therapy: Secondary | ICD-10-CM | POA: Insufficient documentation

## 2020-03-24 DIAGNOSIS — R0789 Other chest pain: Secondary | ICD-10-CM | POA: Diagnosis present

## 2020-03-24 DIAGNOSIS — I1310 Hypertensive heart and chronic kidney disease without heart failure, with stage 1 through stage 4 chronic kidney disease, or unspecified chronic kidney disease: Secondary | ICD-10-CM | POA: Insufficient documentation

## 2020-03-24 DIAGNOSIS — R079 Chest pain, unspecified: Secondary | ICD-10-CM

## 2020-03-24 DIAGNOSIS — I3139 Other pericardial effusion (noninflammatory): Secondary | ICD-10-CM

## 2020-03-24 HISTORY — DX: Malignant (primary) neoplasm, unspecified: C80.1

## 2020-03-24 LAB — BASIC METABOLIC PANEL
Anion gap: 9 (ref 5–15)
BUN: 15 mg/dL (ref 8–23)
CO2: 28 mmol/L (ref 22–32)
Calcium: 8.8 mg/dL — ABNORMAL LOW (ref 8.9–10.3)
Chloride: 100 mmol/L (ref 98–111)
Creatinine, Ser: 1.23 mg/dL — ABNORMAL HIGH (ref 0.44–1.00)
GFR, Estimated: 50 mL/min — ABNORMAL LOW (ref >60)
Glucose, Bld: 348 mg/dL — ABNORMAL HIGH (ref 70–99)
Potassium: 3 mmol/L — ABNORMAL LOW (ref 3.5–5.1)
Sodium: 137 mmol/L (ref 135–145)

## 2020-03-24 LAB — D-DIMER, QUANTITATIVE: D-Dimer, Quant: 2.65 ug/mL-FEU — ABNORMAL HIGH (ref 0.00–0.50)

## 2020-03-24 LAB — CBC
HCT: 39.4 % (ref 36.0–46.0)
Hemoglobin: 12.7 g/dL (ref 12.0–15.0)
MCH: 28.2 pg (ref 26.0–34.0)
MCHC: 32.2 g/dL (ref 30.0–36.0)
MCV: 87.6 fL (ref 80.0–100.0)
Platelets: 247 10*3/uL (ref 150–400)
RBC: 4.5 MIL/uL (ref 3.87–5.11)
RDW: 14.2 % (ref 11.5–15.5)
WBC: 10.9 10*3/uL — ABNORMAL HIGH (ref 4.0–10.5)
nRBC: 0 % (ref 0.0–0.2)

## 2020-03-24 LAB — TROPONIN I (HIGH SENSITIVITY)
Troponin I (High Sensitivity): 9 ng/L (ref <18)
Troponin I (High Sensitivity): 13 ng/L (ref <18)

## 2020-03-24 LAB — RESPIRATORY PANEL BY RT PCR (FLU A&B, COVID)
Influenza A by PCR: NEGATIVE
Influenza B by PCR: NEGATIVE
SARS Coronavirus 2 by RT PCR: NEGATIVE

## 2020-03-24 LAB — I-STAT BETA HCG BLOOD, ED (MC, WL, AP ONLY): I-stat hCG, quantitative: <5 m[IU]/mL (ref <5)

## 2020-03-24 LAB — BRAIN NATRIURETIC PEPTIDE: B Natriuretic Peptide: 64.4 pg/mL (ref 0.0–100.0)

## 2020-03-24 MED ORDER — OXYCODONE-ACETAMINOPHEN 5-325 MG PO TABS
1.0000 | ORAL_TABLET | Freq: Once | ORAL | Status: AC
  Administered 2020-03-24: 1 via ORAL
  Filled 2020-03-24: qty 1

## 2020-03-24 MED ORDER — ONDANSETRON HCL 4 MG/2ML IJ SOLN
4.0000 mg | Freq: Once | INTRAMUSCULAR | Status: AC
  Administered 2020-03-24: 4 mg via INTRAVENOUS
  Filled 2020-03-24: qty 2

## 2020-03-24 MED ORDER — OXYCODONE-ACETAMINOPHEN 5-325 MG PO TABS
1.0000 | ORAL_TABLET | Freq: Four times a day (QID) | ORAL | 0 refills | Status: DC | PRN
Start: 2020-03-24 — End: 2020-07-29

## 2020-03-24 MED ORDER — POTASSIUM CHLORIDE CRYS ER 20 MEQ PO TBCR
40.0000 meq | EXTENDED_RELEASE_TABLET | Freq: Once | ORAL | Status: AC
  Administered 2020-03-24: 40 meq via ORAL
  Filled 2020-03-24: qty 2

## 2020-03-24 MED ORDER — IOHEXOL 350 MG/ML SOLN
100.0000 mL | Freq: Once | INTRAVENOUS | Status: AC | PRN
  Administered 2020-03-24: 100 mL via INTRAVENOUS

## 2020-03-24 MED ORDER — AMLODIPINE BESYLATE 5 MG PO TABS
5.0000 mg | ORAL_TABLET | Freq: Once | ORAL | Status: AC
  Administered 2020-03-24: 5 mg via ORAL
  Filled 2020-03-24: qty 1

## 2020-03-24 MED ORDER — MORPHINE SULFATE (PF) 4 MG/ML IV SOLN
4.0000 mg | Freq: Once | INTRAVENOUS | Status: AC
  Administered 2020-03-24: 4 mg via INTRAVENOUS
  Filled 2020-03-24: qty 1

## 2020-03-24 MED ORDER — CARVEDILOL 12.5 MG PO TABS: 25.0000 mg | ORAL_TABLET | Freq: Two times a day (BID) | ORAL | Status: DC

## 2020-03-24 MED ORDER — NITROGLYCERIN 0.4 MG SL SUBL
0.4000 mg | SUBLINGUAL_TABLET | SUBLINGUAL | Status: DC | PRN
  Administered 2020-03-24: 0.4 mg via SUBLINGUAL
  Filled 2020-03-24: qty 1

## 2020-03-24 NOTE — Telephone Encounter (Signed)
Spoke to pt who is c/o active chest pain with SOB. She said this has been ongoing since 03/13/20. She is concerned she had a "clot that may have traveled". Pt describes the pain to be on L side of her breast. She has taken Advil for this which she states helps her get about an hour of sleep at night. I have advised her to go to ED for evaluation. Pt verbalized understanding.

## 2020-03-24 NOTE — Telephone Encounter (Signed)
Pt c/o of Chest Pain: 1. Are you having CP right now? Yes  2. Are you experiencing any other symptoms (ex. SOB, nausea, vomiting, sweating)?  SOB 3. How long have you been experiencing CP? About a week 4. Is your CP continuous or coming and going? continuous 5. Have you taken Nitroglycerin? No

## 2020-03-24 NOTE — Discharge Instructions (Signed)
As we discussed, your work-up here showed no evidence of Covid, blood clot in your lungs.  As we discussed, there was a pericardial effusion can indicate fluid around the heart as well as some concerns that there may be some inflammation called pericarditis.  We think this is contributing to your pain.  We will plan to treat this with pain medication.  As we discussed, you will need follow-up with your cardiologist.  Please call their office in the next day or so if you have not heard from them.  You can take Tylenol or Ibuprofen as directed for pain. You can alternate Tylenol and Ibuprofen every 4 hours. If you take Tylenol at 1pm, then you can take Ibuprofen at 5pm. Then you can take Tylenol again at 9pm.   Take pain medications as directed for break through pain. Do not drive or operate machinery while taking this medication.   Closely monitor your symptoms.  If at anytime, you have worsening chest pain, difficulty breathing, vomiting, fever or any other worsening concerning symptoms, return the emergency department immediately.

## 2020-03-24 NOTE — ED Triage Notes (Signed)
Pt here from home with c/o left sided chest pain that started yesterday , pt has hx of MI but no stents was told that she ,may need a cabg later on down the road

## 2020-03-24 NOTE — ED Notes (Signed)
Patient verbalizes understanding of discharge instructions. Opportunity for questioning and answers were provided. Armband removed by staff, pt discharged from ED ambulatory to home.  

## 2020-03-24 NOTE — ED Provider Notes (Signed)
[facility] DEPART[facility] Provider Note   CSN: [id number] Arrival date & time: 03/24/20  1352     History No chief complaint on file.   Kimberly Frazier is a 59 y.o. female past medical history of chronic kidney disease, CAD, diabetes, hypertension, hyperlipidemia who presents for evaluation of chest pain.  She states that she first noticed it on or around 03/13/2020.  She states it started as a mild pain on the left side underneath her breast.  She states that she would notice that she was getting up around the house and doing things.  She would also notice it if she laid on one particular side.  She did not have any associated nausea/diaphoresis, vomiting.  She did have some associated shortness of breath.  She felt like if she would stop walking around the house and doing things and rest, it would get better.  She feels like 3 days ago, the pain started becoming more constant and more severe and states it moved up slightly higher in her left chest.  She does state it is worse with deep inspiration and does feel like it is worse when she exerts herself.  She has been taking Advil with minimal improvement.  She describes as a heaviness.  She states she has been coughing but cough is nonproductive.  She has not noted any fevers.  She denies any abdominal pain, leg swelling. She denies any OCP use, recent immobilization, prior history of DVT/PE, recent surgery, leg swelling, or long travel.  She has been vaccinated for Covid.  She states that she has been around her brother who tested positive for Covid.   The history is provided by the patient.       Past Medical History:  Diagnosis Date  . Adrenal adenoma, left   . Arthritis   . Cancer ([place])   . CKD (chronic kidney disease), stage II   . Coronary artery disease 07-28-2018 followed [facility]  currently due to no insurance   per cardiac cath 02-06-2015 (positive mild lateral ishcemia on stress  test)--- dLAD 80%,  mLAD 40%,  mPDA 80% (small vessel),  ostial D1 70%,  CFx with lumial irregarlities-- medical management  . Depression   . Diabetic neuropathy ([place])   . Diabetic retinopathy ([place])    NPDR OU  . Headache   . History of Bell's palsy 07/2011   per pt residual facial pain on left side occasionally  . History of cancer of vagina 1999   per pt completed radiation and chemo  . History of sepsis 12/30/2017   positive blood culter fro E.coli  . Hyperlipidemia   . Hypertension   . Hypertensive retinopathy    OU  . Hypokalemia   . Insulin dependent type 2 diabetes mellitus, uncontrolled ([place])    followed by pcp---  A1c was 11.7 on 06-15-2018 in epic  . Myocardial infarction ([place])   . Nocturia   . Peripheral neuropathy   . PMB (postmenopausal bleeding)   . Wears dentures    upper  . Wears glasses     Patient Active Problem List   Diagnosis Date Noted  . New onset headache 12/21/2019  . Cerebral vascular disease 12/21/2019  . Migraine without aura and without status migrainosus, not intractable 11/12/2019  . Endometrial cancer ([place]) 11/28/2018  . Hematuria 11/28/2018  . 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 History of radiation therapy 11/28/2018  . Diabetes mellitus ([place]) 10/06/2018  . Type 2 diabetes mellitus with  hyperglycemia, with long-term current use of insulin ([place]) 10/06/2018  . Adrenal adenoma, left 06/15/2018  . 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 Postmenopausal vaginal bleeding 06/15/2018  . Hyperkalemia   . Diabetic peripheral neuropathy ([place]) 09/03/2016  . CAD (coronary artery disease) 04/10/2015  . Severe obesity (BMI >= 40) ([place]) 09/20/2013  . 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 Hyperlipidemia 10/25/2012  . Diabetes mellitus type 2, uncontrolled, with complications ([place]) 99/37/1696  . Essential hypertension 07/26/2012    Past Surgical History:  Procedure Laterality Date  . BREAST BIOPSY  2012   benign  . CARDIAC CATHETERIZATION N/A  02/06/2015   Procedure: Left Heart Cath and Coronary Angiography;  Surgeon: Larey Dresser, MD;  Location: [facility];  Service: Cardiovascular;  Laterality: N/A;  . CATARACT EXTRACTION Bilateral OD: 03/07/19, OS: 02/28/19   Dr. Gershon Crane  . CATARACT EXTRACTION, BILATERAL    . EYE SURGERY Bilateral OD: 03/07/19, OS: 02/28/19   Cat Sx - Dr. Gershon Crane  . HYSTEROSCOPY WITH D & C N/A 08/01/2018   Procedure: DILATATION AND CURETTAGE /HYSTEROSCOPY;  Surgeon: Mora Bellman, MD;  Location: [facility];  Service: Gynecology;  Laterality: N/A;  . ROBOTIC ASSISTED TOTAL HYSTERECTOMY WITH BILATERAL SALPINGO OOPHERECTOMY N/A 11/28/2018   Procedure: XI ROBOTIC ASSISTED TOTAL HYSTERECTOMY WITH BILATERAL SALPINGO OOPHORECTOMY;  Surgeon: Everitt Amber, MD;  Location: [facility];  Service: Gynecology;  Laterality: N/A;  . SENTINEL NODE BIOPSY N/A 11/28/2018   Procedure: SENTINEL NODE BIOPSY;  Surgeon: Everitt Amber, MD;  Location: [facility];  Service: Gynecology;  Laterality: N/A;  . UMBILICAL HERNIA REPAIR  child     OB History    Gravida  4   Para  2   Term  2   Preterm  0   AB  2   Living  2     SAB  0   TAB  2   Ectopic  0   Multiple  0   Live Births  2           Family History  Problem Relation Age of Onset  . Heart disease Mother   . Hypertension Mother   . Diabetes Mother   . Cancer Father        hodgkins lymphoma  . Heart disease Sister        heart attack  . Diabetes Sister   . Cancer Other        parent  . Diabetes Other        parent  . Heart disease Other        parent  . Hyperlipidemia Other        parent  . Hypertension Other        parent  . Arthritis Other        parent  . Diabetes Maternal Aunt   . Diabetes Maternal Uncle   . Breast cancer Neg Hx     Social History   Tobacco Use  . Smoking status: Never Smoker  . Smokeless tobacco: Never Used  Vaping Use  . Vaping Use: Never used  Substance Use Topics  . Alcohol use: Not Currently  .  Drug use: Not Currently    Types: Marijuana    Comment: 07-28-2018  per pt last smoked 11/21/2018    Home Medications Prior to Admission medications   Medication Sig Start Date End Date Taking? Authorizing Provider  albuterol (VENTOLIN HFA) 108 (90 Base) MCG/ACT inhaler Inhale 2 puffs into the lungs every 6 (  six) hours as needed for wheezing or shortness of breath. 11/12/19  Yes Ladell Pier, MD  amLODipine (NORVASC) 5 MG tablet TAKE 1 TABLET (5 MG TOTAL) BY MOUTH DAILY. 10/23/19  Yes Ladell Pier, MD  aspirin EC 81 MG tablet Take 1 tablet (81 mg total) daily by mouth. 04/18/17  Yes Ladell Pier, MD  atorvastatin (LIPITOR) 40 MG tablet TAKE 1 TABLET DAILY BY MOUTH. 10/23/19  Yes Ladell Pier, MD  carvedilol (COREG) 25 MG tablet TAKE 1 TABLET (25 MG TOTAL) BY MOUTH 2 (TWO) TIMES DAILY WITH A MEAL. 12/13/19  Yes Ladell Pier, MD  empagliflozin (JARDIANCE) 10 MG TABS tablet Take 1 tablet (10 mg total) by mouth daily. 02/21/20  Yes Ladell Pier, MD  insulin aspart (NOVOLOG) 100 UNIT/ML injection Inject 28 Units into the skin 3 (three) times daily before meals. Patient taking differently: Inject 24 Units into the skin 3 (three) times daily before meals.  12/18/19  Yes Ladell Pier, MD  insulin glargine (LANTUS SOLOSTAR) 100 UNIT/ML Solostar Pen INJECT 60 UNITS INTO THE SKIN DAILY. Patient taking differently: Inject 54 Units into the skin at bedtime. INJECT 54 UNITS INTO THE SKIN DAILY. 01/15/20  Yes Ladell Pier, MD  isosorbide mononitrate (IMDUR) 60 MG 24 hr tablet TAKE 1 TABLET (60 MG TOTAL) BY MOUTH DAILY. 02/14/20 05/14/20 Yes Buford Dresser, MD  ketorolac (ACULAR) 0.5 % ophthalmic solution Place 1 drop into both eyes 4 (four) times daily. 01/23/20 01/22/21 Yes Bernarda Caffey, MD  nitroGLYCERIN (NITROSTAT) 0.4 MG SL tablet Place 1 tab under tongue for chest pain.  May repeat after 5 minutes x 2.  DO NOT TAKE MORE THAN 3 TABS DURING AN EPISODE OF CHEST  PAIN Patient taking differently: Place 0.4 mg under the tongue every 5 (five) minutes as needed for chest pain. DO NOT TAKE MORE THAN 3 TABS DURING AN EPISODE OF CHEST PAIN 09/18/18  Yes Buford Dresser, MD  potassium chloride (KLOR-CON) 10 MEQ tablet Take 1 tablet (10 mEq total) by mouth daily. 01/16/20  Yes Ladell Pier, MD  Accu-Chek Softclix Lancets lancets Use to check blood sugar 3 times daily. 08/27/19   Ladell Pier, MD  Blood Glucose Monitoring Suppl (ACCU-CHEK GUIDE) w/Device KIT 1 kit by Does not apply route in the morning, at noon, and at bedtime. Use to check blood sugar 3 times daily. 08/27/19   Ladell Pier, MD  Bromfenac Sodium (PROLENSA) 0.07 % SOLN Place 1 drop into both eyes 4 (four) times daily. Patient not taking: Reported on 03/24/2020 03/23/19   Bernarda Caffey, MD  butalbital-acetaminophen-caffeine [facility]) [phone] MG tablet Take 1 tablet by mouth every 6 (six) hours as needed for headache. 12/21/19   Marcial Pacas, MD  glucose blood (ACCU-CHEK GUIDE) test strip Use to check blood sugar 3 times daily. 08/27/19   Ladell Pier, MD  Insulin Syringe-Needle U-100 (TRUEPLUS INSULIN SYRINGE) 31G X 5/16" 0.3 ML MISC Use to inject Humalog three times daily. 08/27/19   Ladell Pier, MD  oxyCODONE-acetaminophen (PERCOCET/ROXICET) 5-325 MG tablet Take 1-2 tablets by mouth every 6 (six) hours as needed for severe pain. 03/24/20   Volanda Napoleon, PA-C  prednisoLONE acetate (PRED FORTE) 1 % ophthalmic suspension Place 1 drop into both eyes 4 (four) times daily. Patient not taking: Reported on 03/24/2020 01/23/20   Bernarda Caffey, MD  tiZANidine (ZANAFLEX) 4 MG tablet Take 1 tablet (4 mg total) by mouth every 6 (six) hours as needed for  muscle spasms. Patient not taking: Reported on 03/24/2020 12/21/19   Marcial Pacas, MD  topiramate (TOPAMAX) 50 MG tablet Take 1 tablet (50 mg total) by mouth 2 (two) times daily. Patient not taking: Reported on 03/24/2020 12/21/19    Marcial Pacas, MD  TRUEPLUS PEN NEEDLES 32G X 4 MM MISC USE AS DIRECTED WITH INSULIN 10/30/19   Ladell Pier, MD    Allergies    Patient has no known allergies.  Review of Systems   Review of Systems  Constitutional: Negative for fever.  Respiratory: Positive for cough, shortness of breath and stridor.   Cardiovascular: Positive for chest pain. Negative for leg swelling.  Gastrointestinal: Negative for abdominal pain, nausea and vomiting.  Genitourinary: Negative for dysuria and hematuria.  Neurological: Negative for headaches.  All other systems reviewed and are negative.   Physical Exam Updated Vital Signs BP 130/88 (BP Location: Left Arm)   Pulse 80   Temp 99 F (37.2 C) (Oral)   Resp 16   SpO2 98%   Physical Exam Vitals and nursing note reviewed.  Constitutional:      Appearance: Normal appearance. She is well-developed.  HENT:     Head: Normocephalic and atraumatic.  Eyes:     General: Lids are normal.     Conjunctiva/sclera: Conjunctivae normal.     Pupils: Pupils are equal, round, and reactive to light.  Cardiovascular:     Rate and Rhythm: Normal rate and regular rhythm.     Pulses: Normal pulses.          Radial pulses are 2+ on the right side and 2+ on the left side.       Dorsalis pedis pulses are 2+ on the right side and 2+ on the left side.     Heart sounds: Normal heart sounds. No murmur heard.  No friction rub. No gallop.   Pulmonary:     Effort: Pulmonary effort is normal.     Breath sounds: Normal breath sounds.     Comments: Lungs clear to auscultation bilaterally.  Symmetric chest rise.  No wheezing, rales, rhonchi. No evidence of respiratory distress.  Chest:     Comments: Chest pain reproduced with palpation of anterior chest wall and movement.  Abdominal:     Palpations: Abdomen is soft. Abdomen is not rigid.     Tenderness: There is no abdominal tenderness. There is no guarding.     Comments: Abdomen is soft, non-distended, non-tender. No  rigidity, No guarding. No peritoneal signs.  Musculoskeletal:        General: Normal range of motion.     Cervical back: Full passive range of motion without pain.     Comments: No tenderness to palpation to bilateral knees and ankles. No deformities or crepitus noted. FROM of BLE without any difficulty.    Skin:    General: Skin is warm and dry.     Capillary Refill: Capillary refill takes less than 2 seconds.  Neurological:     Mental Status: She is alert and oriented to person, place, and time.  Psychiatric:        Speech: Speech normal.     ED Results / Procedures / Treatments   Labs (all labs ordered are listed, but only abnormal results are displayed) Labs Reviewed  BASIC METABOLIC PANEL - Abnormal; Notable for the following components:      Result Value   Potassium 3.0 (*)    Glucose, Bld 348 (*)    Creatinine, Ser 1.23 (*)  Calcium 8.8 (*)    GFR, Estimated 50 (*)    All other components within normal limits  CBC - Abnormal; Notable for the following components:   WBC 10.9 (*)    All other components within normal limits  D-DIMER, QUANTITATIVE (NOT AT [facility]) - Abnormal; Notable for the following components:   D-Dimer, Quant 2.65 (*)    All other components within normal limits  RESPIRATORY PANEL BY RT PCR (FLU A&B, COVID)  BRAIN NATRIURETIC PEPTIDE  I-STAT BETA HCG BLOOD, ED ([facility], [facility], [facility] ONLY)  TROPONIN I (HIGH SENSITIVITY)  TROPONIN I (HIGH SENSITIVITY)    EKG EKG Interpretation  Date/Time:  Monday March 24 2020 14:09:17 EDT Ventricular Rate:  94 PR Interval:  182 QRS Duration: 86 QT Interval:  364 QTC Calculation: 455 R Axis:   -45 Text Interpretation: Normal sinus rhythm Left axis deviation Anterior infarct , age undetermined Abnormal ECG Confirmed by Gerlene Fee [phone]) on 03/24/2020 7:18:07 PM   Radiology DG Chest 2 View  Result Date: 03/24/2020 CLINICAL DATA:  LEFT side chest pain since yesterday, history of coronary artery disease post MI,  diabetes mellitus, hypertension EXAM: CHEST - 2 VIEW COMPARISON:  12/30/2017 FINDINGS: Enlargement of cardiac silhouette. Mediastinal contours and pulmonary vascularity normal. Atelectasis versus consolidation at LEFT lung base. Probable associated small LEFT pleural effusion. Remaining lungs clear. No pneumothorax or acute osseous findings. Scattered endplate spur formation thoracic spine. IMPRESSION: Atelectasis versus consolidation at LEFT lung base with probable associated small LEFT pleural effusion. Enlargement of cardiac silhouette. Electronically Signed   By: Lavonia Dana M.D.   On: 03/24/2020 15:05   CT Angio Chest PE W and/or Wo Contrast  Result Date: 03/24/2020 CLINICAL DATA:  Chest pain and shortness of breath for 2 weeks. EXAM: CT ANGIOGRAPHY CHEST WITH CONTRAST TECHNIQUE: Multidetector CT imaging of the chest was performed using the standard protocol during bolus administration of intravenous contrast. Multiplanar CT image reconstructions and MIPs were obtained to evaluate the vascular anatomy. CONTRAST:  166m OMNIPAQUE IOHEXOL 350 MG/ML SOLN COMPARISON:  Cardiac CT 09/27/2019 FINDINGS: Cardiovascular: The heart is mildly enlarged but stable. 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 Moderate-sized pericardial effusion, slightly increased since the prior study. Lipomatous hypertrophy of the interatrial septum noted. The aorta is normal in caliber. No dissection. Stable coronary artery calcifications. The pulmonary arterial tree is well opacified. No filling defects to suggest pulmonary embolism. Mediastinum/Nodes: Stable borderline mediastinal lymph nodes. The esophagus is grossly normal. Lungs/Pleura: Left lower lobe airspace consolidation, atelectasis and left pleural effusion. The right lung is clear. No pulmonary edema. Upper Abdomen: No significant upper abdominal findings. Musculoskeletal: No significant bony findings. Review of the MIP images confirms the above  findings. IMPRESSION: 1. No CT findings for pulmonary embolism. 2. Normal caliber thoracic aorta. 3. 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 4. Moderate-sized pericardial effusion, slightly increased since the prior study. 5. Left lower lobe airspace consolidation, atelectasis and left pleural effusion. 6. Stable borderline mediastinal lymph nodes. 7. Stable coronary artery calcifications. Aortic Atherosclerosis (ICD10-I70.0). Electronically Signed   By: PMarijo SanesM.D.   On: 03/24/2020 18:50    Procedures Procedures (including critical care time)  Medications Ordered in ED Medications  nitroGLYCERIN (NITROSTAT) SL tablet 0.4 mg (0.4 mg Sublingual Given 03/24/20 1723)  carvedilol (COREG) tablet 25 mg (has no administration in time range)  iohexol (OMNIPAQUE) 350 MG/ML injection 100 mL (100 mLs Intravenous Contrast Given 03/24/20 1820)  morphine 4  MG/ML injection 4 mg (4 mg Intravenous Given 03/24/20 1938)  ondansetron (ZOFRAN) injection 4 mg (4 mg Intravenous Given 03/24/20 1938)  amLODipine (NORVASC) tablet 5 mg (5 mg Oral Given 03/24/20 2037)  oxyCODONE-acetaminophen (PERCOCET/ROXICET) 5-325 MG per tablet 1 tablet (1 tablet Oral Given 03/24/20 2327)  potassium chloride SA (KLOR-CON) CR tablet 40 mEq (40 mEq Oral Given 03/24/20 2327)    ED Course  I have reviewed the triage vital signs and the nursing notes.  Pertinent labs & imaging results that were available during my care of the patient were reviewed by me and considered in my medical decision making (see chart for details).    MDM Rules/Calculators/A&P                          59 year old female past history of CAD, hyperlipidemia, hypertension presents for evaluation of chest pain.  States that been occurring intermittently since 03/13/2020 for the last 3 days has been more constant in nature.  Admitted been worse when she got up and walked around and improved when she sat.  Also reports is  worse when she lays on her side as well as with deep inspiration.  She has had some cough.  No fevers.  She also feels like she has had some shortness of breath with exertion.  No PE risk factors.  Has been vaccinated for Covid but brother has tested positive and she has been around him.  On initial arrival, she is afebrile nontoxic-appearing.  Vital signs are stable she is slightly hypertensive.  Given her history, will plan for EKG, Trope, chest x-ray.  She has some typical and atypical features.  Could be ACS versus unstable angina.  Also consider infectious etiology.  Also will obtain D-dimer to rule out PE given the pleuritic component of her chest pain as well as dyspnea on exertion.  BMP shows potassium of 3.0.  Glucose 348.  BUN of 15, creatinine 1.23.  CBC shows slight leukocytosis at 10.9.  Troponin is normal.  I-STAT beta is negative.  Chest x-ray shows atelectasis versus consolidation of left lung base with probable associated small left pleural effusion.  She has enlargement of cardiac silhouette.  Cath on 01/2017: LAD with 40% mid stenosis, 80% distal stenosis, 70% ostial stenosis, and 50% mid vessel stenosis.   Most recent cards visit was in July CT coronary on 09/29/19: coronary artery calcium score 350.  Plan for stable angina was Imdur, nitro.   CT angio chest shows no evidence of PE.  There is a stable left ventricular aneurysm at the apex likely rate of prior infarct.  There is moderate sized pericardial effusion slightly increased since prior study.  She has left lower lobe airspace consolidation, atelectasis and left pleural effusion.  Stable border borderline mediastinal lymph nodes.  I discussed with Dr. Sonia Side (Cards) regarding work-up here in the ED.  Given her work-up, symptoms could be from pericarditis.  She states that even with pericardial effusion and pericarditis, there would not be nothing to do from an inpatient standpoint from cards.  She recommends attempting her pain  under control and monitoring her blood pressure.  If patient feels better and blood pressure is improved, patient can be discharged home with pain control follow-up in the outpatient office.  Patient's blood pressure increased during her stay here in the ED.  She has not had her nightly medications.  Additionally, she still having some pain.  We will give her analgesics as  this may likely be related to pain control and reassess.  Reevaluation.  Patient's blood pressure is 169/116.  Discussed results with patient.  She reports improvement in chest pain after pain medication.  Patient ambulated in ED and states she feels comfortable going home.  I discussed with her that this could be indicative of pericarditis given her work-up here and her chest pain.  We will plan to treat with pain medications.  As we discussed, she will need outpatient follow-up with Dr. Harrell Gave.  Instructed patient to call their office if she is not heard from the next several days.  Will give short course of pain medication for acute breakthrough pain. At this time history/physical exam is not consistent with hypertensive emergency.  Discussed patient with Dr. Sedonia Small who is agreeable to plan. Patient had ample opportunity for questions and discussion. All patient's questions were answered with full understanding. Strict return precautions discussed. Patient expresses understanding and agreement to plan.   Portions of this note were generated with [employer]. Dictation errors may occur despite best attempts at proofreading.   Final Clinical Impression(s) / ED Diagnoses Final diagnoses:  Nonspecific chest pain  Pericardial effusion  Hypertension, unspecified type  Hypokalemia    Rx / DC Orders ED Discharge Orders         Ordered    oxyCODONE-acetaminophen (PERCOCET/ROXICET) 5-325 MG tablet  Every 6 hours PRN        03/24/20 2321           Volanda Napoleon, PA-C 03/24/20 2347    Maudie Flakes,  MD 03/25/20 [phone]

## 2020-03-25 MED FILL — LANTUS SOLOSTAR 100 UNITS/M: 100 | 80 days supply | Qty: 45 | Fill #5

## 2020-03-26 ENCOUNTER — Telehealth: Payer: Medicare Other | Admitting: Cardiology

## 2020-03-27 ENCOUNTER — Encounter: Payer: Self-pay | Admitting: Cardiology

## 2020-03-27 ENCOUNTER — Ambulatory Visit (INDEPENDENT_AMBULATORY_CARE_PROVIDER_SITE_OTHER): Payer: Medicare Other | Admitting: Cardiology

## 2020-03-27 ENCOUNTER — Other Ambulatory Visit: Payer: Self-pay

## 2020-03-27 VITALS — BP 146/88 | HR 101 | Ht 64.0 in | Wt 283.0 lb

## 2020-03-27 DIAGNOSIS — E7800 Pure hypercholesterolemia, unspecified: Secondary | ICD-10-CM | POA: Diagnosis not present

## 2020-03-27 DIAGNOSIS — I2510 Atherosclerotic heart disease of native coronary artery without angina pectoris: Secondary | ICD-10-CM

## 2020-03-27 DIAGNOSIS — I25118 Atherosclerotic heart disease of native coronary artery with other forms of angina pectoris: Secondary | ICD-10-CM | POA: Diagnosis not present

## 2020-03-27 DIAGNOSIS — R0781 Pleurodynia: Secondary | ICD-10-CM | POA: Diagnosis not present

## 2020-03-27 DIAGNOSIS — I10 Essential (primary) hypertension: Secondary | ICD-10-CM

## 2020-03-27 DIAGNOSIS — I313 Pericardial effusion (noninflammatory): Secondary | ICD-10-CM | POA: Diagnosis not present

## 2020-03-27 DIAGNOSIS — E669 Obesity, unspecified: Secondary | ICD-10-CM

## 2020-03-27 DIAGNOSIS — I3139 Other pericardial effusion (noninflammatory): Secondary | ICD-10-CM

## 2020-03-27 DIAGNOSIS — E1169 Type 2 diabetes mellitus with other specified complication: Secondary | ICD-10-CM

## 2020-03-27 NOTE — Progress Notes (Signed)
Cardiology Office Note:    Date:  03/27/2020   ID:  Kimberly Frazier, DOB 1960/10/22, MRN [id number]  PCP:  Ladell Pier, MD  Cardiologist:  Buford Dresser, MD  Referring MD: Ladell Pier, MD   CC: follow up  History of Present Illness:    Kimberly Frazier is a 59 y.o. female with a hx of CAD, hypertension, hypercholesterolemia, type II diabetes on insulin, obesity who is seen for follow up.  Today: Seen for post ER follow up for chest pain.   Has been going on for several weeks, started around 03/13/20. Felt like she couldn't breathe, couldn't lay comfortably. Better with advil. Was having rare chills, no clear fevers. Noted after her brother was positive for Covid. She has tested negative for Covid on several occasions since. Hs Tn, CTA unremarkable for MI or PE.  Dull, achy pain, constant. Started left upper abdomen/side and then moved up. Better with oxycodone.  Coughing up some phlegm, no fevers. Wonders why if they saw infection she didn't get an antibiotic. Hasn't seen Dr. Wynetta Emery yet.  Past Medical History:  Diagnosis Date  . Adrenal adenoma, left   . Arthritis   . Cancer ([place])   . CKD (chronic kidney disease), stage II   . Coronary artery disease 07-28-2018 [facility]  currently due to no insurance   per cardiac cath 02-06-2015 (positive mild lateral ishcemia on stress test)--- dLAD 80%,  mLAD 40%,  mPDA 80% (small vessel),  ostial D1 70%,  CFx with lumial irregarlities-- medical management  . Depression   . Diabetic neuropathy ([place])   . Diabetic retinopathy ([place])    NPDR OU  . Headache   . History of Bell's palsy 07/2011   per pt residual facial pain on left side occasionally  . History of cancer of vagina 1999   per pt completed radiation and chemo  . History of sepsis 12/30/2017   positive blood culter fro E.coli  . Hyperlipidemia   . Hypertension   . Hypertensive retinopathy    OU  . Hypokalemia   . Insulin  dependent type 2 diabetes mellitus, uncontrolled ([place])    followed by pcp---  A1c was 11.7 on 06-15-2018 in epic  . Myocardial infarction ([place])   . Nocturia   . Peripheral neuropathy   . PMB (postmenopausal bleeding)   . Wears dentures    upper  . Wears glasses     Past Surgical History:  Procedure Laterality Date  . BREAST BIOPSY  2012   benign  . CARDIAC CATHETERIZATION N/A 02/06/2015   Procedure: Left Heart Cath and Coronary Angiography;  Surgeon: Larey Dresser, MD;  Location: [facility];  Service: Cardiovascular;  Laterality: N/A;  . CATARACT EXTRACTION Bilateral OD: 03/07/19, OS: 02/28/19   Dr. Gershon Crane  . CATARACT EXTRACTION, BILATERAL    . EYE SURGERY Bilateral OD: 03/07/19, OS: 02/28/19   Cat Sx - Dr. Gershon Crane  . HYSTEROSCOPY WITH D & C N/A 08/01/2018   Procedure: DILATATION AND CURETTAGE /HYSTEROSCOPY;  Surgeon: Mora Bellman, MD;  Location: [facility];  Service: Gynecology;  Laterality: N/A;  . ROBOTIC ASSISTED TOTAL HYSTERECTOMY WITH BILATERAL SALPINGO OOPHERECTOMY N/A 11/28/2018   Procedure: XI ROBOTIC ASSISTED TOTAL HYSTERECTOMY WITH BILATERAL SALPINGO OOPHORECTOMY;  Surgeon: Everitt Amber, MD;  Location: [facility];  Service: Gynecology;  Laterality: N/A;  . SENTINEL NODE BIOPSY N/A 11/28/2018   Procedure: SENTINEL NODE BIOPSY;  Surgeon: Everitt Amber, MD;  Location: [facility];  Service:  Gynecology;  Laterality: N/A;  . UMBILICAL HERNIA REPAIR  child    Current Medications: Current Outpatient Medications on File Prior to Visit  Medication Sig  . Accu-Chek Softclix Lancets lancets Use to check blood sugar 3 times daily.  Marland Kitchen albuterol (VENTOLIN HFA) 108 (90 Base) MCG/ACT inhaler Inhale 2 puffs into the lungs every 6 (six) hours as needed for wheezing or shortness of breath.  Marland Kitchen amLODipine (NORVASC) 5 MG tablet TAKE 1 TABLET (5 MG TOTAL) BY MOUTH DAILY.  Marland Kitchen aspirin EC 81 MG tablet Take 1 tablet (81 mg total) daily by mouth.  Marland Kitchen atorvastatin (LIPITOR) 40 MG tablet  TAKE 1 TABLET DAILY BY MOUTH.  . Blood Glucose Monitoring Suppl (ACCU-CHEK GUIDE) w/Device KIT 1 kit by Does not apply route in the morning, at noon, and at bedtime. Use to check blood sugar 3 times daily.  . Bromfenac Sodium (PROLENSA) 0.07 % SOLN Place 1 drop into both eyes 4 (four) times daily.  . butalbital-acetaminophen-caffeine (FIORICET) 50-325-40 MG tablet Take 1 tablet by mouth every 6 (six) hours as needed for headache.  . carvedilol (COREG) 25 MG tablet TAKE 1 TABLET (25 MG TOTAL) BY MOUTH 2 (TWO) TIMES DAILY WITH A MEAL.  Marland Kitchen empagliflozin (JARDIANCE) 10 MG TABS tablet Take 1 tablet (10 mg total) by mouth daily.  Marland Kitchen glucose blood (ACCU-CHEK GUIDE) test strip Use to check blood sugar 3 times daily.  . insulin aspart (NOVOLOG) 100 UNIT/ML injection Inject 28 Units into the skin 3 (three) times daily before meals. (Patient taking differently: Inject 24 Units into the skin 3 (three) times daily before meals. )  . insulin glargine (LANTUS SOLOSTAR) 100 UNIT/ML Solostar Pen INJECT 60 UNITS INTO THE SKIN DAILY. (Patient taking differently: Inject 54 Units into the skin at bedtime. INJECT 54 UNITS INTO THE SKIN DAILY.)  . Insulin Syringe-Needle U-100 (TRUEPLUS INSULIN SYRINGE) 31G X 5/16" 0.3 ML MISC Use to inject Humalog three times daily.  . isosorbide mononitrate (IMDUR) 60 MG 24 hr tablet TAKE 1 TABLET (60 MG TOTAL) BY MOUTH DAILY.  Marland Kitchen ketorolac (ACULAR) 0.5 % ophthalmic solution Place 1 drop into both eyes 4 (four) times daily.  . nitroGLYCERIN (NITROSTAT) 0.4 MG SL tablet Place 1 tab under tongue for chest pain.  May repeat after 5 minutes x 2.  DO NOT TAKE MORE THAN 3 TABS DURING AN EPISODE OF CHEST PAIN (Patient taking differently: Place 0.4 mg under the tongue every 5 (five) minutes as needed for chest pain. DO NOT TAKE MORE THAN 3 TABS DURING AN EPISODE OF CHEST PAIN)  . oxyCODONE-acetaminophen (PERCOCET/ROXICET) 5-325 MG tablet Take 1-2 tablets by mouth every 6 (six) hours as needed for  severe pain.  . potassium chloride (KLOR-CON) 10 MEQ tablet Take 1 tablet (10 mEq total) by mouth daily.  . prednisoLONE acetate (PRED FORTE) 1 % ophthalmic suspension Place 1 drop into both eyes 4 (four) times daily.  Marland Kitchen tiZANidine (ZANAFLEX) 4 MG tablet Take 1 tablet (4 mg total) by mouth every 6 (six) hours as needed for muscle spasms.  Marland Kitchen topiramate (TOPAMAX) 50 MG tablet Take 1 tablet (50 mg total) by mouth 2 (two) times daily.  . TRUEPLUS PEN NEEDLES 32G X 4 MM MISC USE AS DIRECTED WITH INSULIN   No current facility-administered medications on file prior to visit.     Allergies:   Patient has no known allergies.   Social History   Tobacco Use  . Smoking status: Never Smoker  . Smokeless tobacco: Never Used  Vaping Use  . Vaping Use: Never used  Substance Use Topics  . Alcohol use: Not Currently  . Drug use: Not Currently    Types: Marijuana    Comment: 07-28-2018  per pt last smoked 11/21/2018    Family History: family history includes Arthritis in an other family member; Cancer in her father and another family member; Diabetes in her maternal aunt, maternal uncle, mother, sister, and another family member; Heart disease in her mother, sister, and another family member; Hyperlipidemia in an other family member; Hypertension in her mother and another family member. There is no history of Breast cancer.  ROS:   Please see the history of present illness.  Additional pertinent ROS otherwise unremarkable  EKGs/Labs/Other Studies Reviewed:    The following studies were reviewed today: CT coronary 09/29/19 Coronary calcium score: The patient's coronary artery calcium score is 350, which places the patient in the 97th percentile.  Coronary arteries: Normal coronary origins.  Right dominance.  Right Coronary Artery: Dominant. Mild 25-49% non-calcified plaque of the mid-vessel (CADRADS2).  Left Main Coronary Artery: Eccentric calcification with minimal 1-24% mixed stenosis  (CADRADS1). Bifurcates into the LAD and LCx vessels.  Left Anterior Descending Coronary Artery: The LAD does not reach the apex and may be occluded in the distal portion. There is moderate 50-69% (CADRADS3) mid-vessel non-calcified stenosis.  Left Circumflex Artery: Minimal distal 1-24% diffuse non-calcified stenosis. Smaller distal OM1 branch has calcified plaque with mild to moderate stenosis.  Aorta: Normal size, 37 mm at the mid ascending aorta (level of the PA bifurcation) measured double oblique. No calcifications. No dissection.  Aortic Valve: Trileaflet.  No calcifications.  Other findings:  Normal pulmonary vein drainage into the left atrium.  Normal left atrial appendage without a thrombus.  Dilated main pulmonary artery measuring 32 mm, suggestive of pulmonary hypertension.  Moderate sized circumferential pericardial effusion.  IMPRESSION: 1. Moderate non-calcified mid-LAD stenosis, possible distal LAD occlusion, CADRADS = 3. CT FFR will be performed and reported separately.  2. Coronary calcium score of 350. This was 97th percentile for age and sex matched control.  3. Normal coronary origin with right dominance.  4.  Moderate-sized circumferential pericardial effusion  5. Dilated main pulmonary artery to 32 mm, suggestive of pulmonary hypertension  6.  See radiology findings regarding lung parenchymal changes  EKG:  EKG is personally reviewed.  The ekg ordered today demonstrates sinus tachycardia at 102 bpm, PRWP  Recent Labs: 09/14/2019: ALT 13 12/21/2019: TSH 1.050 03/24/2020: B Natriuretic Peptide 64.4; BUN 15; Creatinine, Ser 1.23; Hemoglobin 12.7; Platelets 247; Potassium 3.0; Sodium 137  Recent Lipid Panel    Component Value Date/Time   CHOL 156 03/07/2020 0925   TRIG 130 03/07/2020 0925   HDL 41 03/07/2020 0925   CHOLHDL 3.8 03/07/2020 0925   CHOLHDL 5 07/15/2015 1513   VLDL 44.0 (H) 07/15/2015 1513   LDLCALC 92 03/07/2020  0925   LDLDIRECT 93.0 07/15/2015 1513    Physical Exam:    VS:  BP (!) 146/88 (BP Location: Left Arm, Patient Position: Sitting, Cuff Size: Large)   Pulse (!) 101   Ht 5' 4"  (1.626 m)   Wt 283 lb (128.4 kg)   BMI 48.58 kg/m     Wt Readings from Last 3 Encounters:  03/27/20 283 lb (128.4 kg)  12/21/19 (!) 285 lb (129.3 kg)  12/12/19 286 lb (129.7 kg)    GEN: Well nourished, well developed in no acute distress HEENT: Normal, moist mucous membranes NECK: No JVD CARDIAC:  regular rhythm, normal S1 and S2, no rubs or gallops. No murmur. VASCULAR: Radial and DP pulses 2+ bilaterally. No carotid bruits RESPIRATORY:  Clear to auscultation without wheezing or rhonchi. Slightly diminished at left base ABDOMEN: Soft, non-tender, non-distended MUSCULOSKELETAL:  Ambulates independently SKIN: Warm and dry, no edema NEUROLOGIC:  Alert and oriented x 3. No focal neuro deficits noted. PSYCHIATRIC:  Normal affect   ASSESSMENT:    1. Pleuritic chest pain   2. Pericardial effusion   3. Coronary artery disease of native artery of native heart with stable angina pectoris ([place])   4. Pure hypercholesterolemia   5. Diabetes mellitus type 2 in obese ([place])   6. Essential hypertension    PLAN:    Chest pain, pleuritic: recent ER visit negative for PE, did note left lower lobe airspace opacity.  -hsTnI unremarkable -Covid negative -has follow up scheduled with Dr. Wynetta Emery, may need antibiotics -reviewed red flag warning signs that need immediate medical attention -CT with pericardial effusion. Will get echo to compare to prior -will order ESR, CRP as pericardial effusion not helpful in this case to determine if pericarditis  CAD: with stable angina rarely -see CT as above -continue amlodipine, imdur for BP and microvascular circulation -has nitroglycerin, given instructions on use -continue aspirin 81 mg daily -continue carvedilol 25 mg BID -instructed on red flag warning signs that need  immediate medical attention -discussed avoidance of NSAIDs, danger of MI with CAD. -reviewed exercise and nutrition recommendations, weight loss goals  -ECG without acute changes today  Type II diabetes: -last A1c 9.4 -on empagliflozin 10 mg daily  Hypercholesterolemia: -Continue atorvastatin 40 mg daily -last LDL 92 02/2020. Discussed increasing statin. She would like to work on lifestyle first -recheck lipids before next visit  Hypertension: goal of <130/80 -elevated today, but she is uncomfortable -continue amlodipine, imdur for BP and microvascular circulation -continue carvedilol 25 mg BID  Cardiac risk counseling and prevention recommendations: -recommend heart healthy/Mediterranean diet, with whole grains, fruits, vegetable, fish, lean meats, nuts, and olive oil. Limit salt. -recommend moderate walking, 3-5 times/week for 30-50 minutes each session. Aim for at least 150 minutes.week. Goal should be pace of 3 miles/hours, or walking 1.5 miles in 30 minutes -recommend avoidance of tobacco products. Avoid excess alcohol. -ASCVD risk score: The 10-year ASCVD risk score Mikey Bussing DC Brooke Bonito., et al., 2013) is: 21.5%   Values used to calculate the score:     Age: 52 years     Sex: Female     Is Non-Hispanic African American: Yes     Diabetic: Yes     Tobacco smoker: No     Systolic Blood Pressure: 818 mmHg     Is BP treated: Yes     HDL Cholesterol: 41 mg/dL     Total Cholesterol: 156 mg/dL    Plan for follow up: 3 mos  Buford Dresser, MD, PhD [place]  [facility]    Medication Adjustments/Labs and Tests Ordered: Current medicines are reviewed at length with the patient today.  Concerns regarding medicines are outlined above.  Orders Placed This Encounter  Procedures  . C-reactive protein  . Sed Rate (ESR)  . EKG 12-Lead  . ECHOCARDIOGRAM COMPLETE   No orders of the defined types were placed in this encounter.   Patient Instructions  Medication  Instructions:  Your Physician recommend you continue on your current medication as directed.    *If you need a refill on your cardiac medications before your next appointment, please call your  pharmacy*   Lab Work: Your physician recommends that you return for lab work today ( ESR, CRP).  If you have labs (blood work) drawn today and your tests are completely normal, you will receive your results only by: Marland Kitchen MyChart Message (if you have MyChart) OR . A paper copy in the mail If you have any lab test that is abnormal or we need to change your treatment, we will call you to review the results.   Testing/Procedures: Your physician has requested that you have an echocardiogram. Echocardiography is a painless test that uses sound waves to create images of your heart. It provides your doctor with information about the size and shape of your heart and how well your heart's chambers and valves are working. This procedure takes approximately one hour. There are no restrictions for this procedure. [place] [address]    Follow-Up: At [facility], you and your health needs are our priority.  As part of our continuing mission to provide you with exceptional heart care, we have created designated Provider Care Teams.  These Care Teams include your primary Cardiologist (physician) and Advanced Practice Providers (APPs -  Physician Assistants and Nurse Practitioners) who all work together to provide you with the care you need, when you need it.  We recommend signing up for the patient portal called "MyChart".  Sign up information is provided on this After Visit Summary.  MyChart is used to connect with patients for Virtual Visits (Telemedicine).  Patients are able to view lab/test results, encounter notes, upcoming appointments, etc.  Non-urgent messages can be sent to your provider as well.   To learn more about what you can do with MyChart, go to [email or web address].    Your next  appointment:   3 month(s)  The format for your next appointment:   In Person  Provider:   Buford Dresser, MD       Signed, Buford Dresser, MD PhD 03/27/2020  [place]

## 2020-03-27 NOTE — Patient Instructions (Signed)
Medication Instructions:  Your Physician recommend you continue on your current medication as directed.    *If you need a refill on your cardiac medications before your next appointment, please call your pharmacy*   Lab Work: Your physician recommends that you return for lab work today ( ESR, CRP).  If you have labs (blood work) drawn today and your tests are completely normal, you will receive your results only by: Marland Kitchen MyChart Message (if you have MyChart) OR . A paper copy in the mail If you have any lab test that is abnormal or we need to change your treatment, we will call you to review the results.   Testing/Procedures: Your physician has requested that you have an echocardiogram. Echocardiography is a painless test that uses sound waves to create images of your heart. It provides your doctor with information about the size and shape of your heart and how well your heart's chambers and valves are working. This procedure takes approximately one hour. There are no restrictions for this procedure. [place] [address]    Follow-Up: At [facility], you and your health needs are our priority.  As part of our continuing mission to provide you with exceptional heart care, we have created designated Provider Care Teams.  These Care Teams include your primary Cardiologist (physician) and Advanced Practice Providers (APPs -  Physician Assistants and Nurse Practitioners) who all work together to provide you with the care you need, when you need it.  We recommend signing up for the patient portal called "MyChart".  Sign up information is provided on this After Visit Summary.  MyChart is used to connect with patients for Virtual Visits (Telemedicine).  Patients are able to view lab/test results, encounter notes, upcoming appointments, etc.  Non-urgent messages can be sent to your provider as well.   To learn more about what you can do with MyChart, go to [email or web address].     Your next appointment:   3 month(s)  The format for your next appointment:   In Person  Provider:   Buford Dresser, MD

## 2020-03-28 ENCOUNTER — Ambulatory Visit (HOSPITAL_BASED_OUTPATIENT_CLINIC_OR_DEPARTMENT_OTHER): Payer: Medicare Other | Admitting: Pharmacist

## 2020-03-28 ENCOUNTER — Other Ambulatory Visit: Payer: Self-pay | Admitting: Internal Medicine

## 2020-03-28 ENCOUNTER — Ambulatory Visit: Payer: Medicare Other | Attending: Internal Medicine | Admitting: Internal Medicine

## 2020-03-28 ENCOUNTER — Encounter: Payer: Self-pay | Admitting: Internal Medicine

## 2020-03-28 VITALS — BP 161/110 | HR 97 | Resp 16 | Wt 286.6 lb

## 2020-03-28 DIAGNOSIS — E1142 Type 2 diabetes mellitus with diabetic polyneuropathy: Secondary | ICD-10-CM | POA: Diagnosis not present

## 2020-03-28 DIAGNOSIS — E785 Hyperlipidemia, unspecified: Secondary | ICD-10-CM | POA: Insufficient documentation

## 2020-03-28 DIAGNOSIS — J189 Pneumonia, unspecified organism: Secondary | ICD-10-CM | POA: Diagnosis not present

## 2020-03-28 DIAGNOSIS — I119 Hypertensive heart disease without heart failure: Secondary | ICD-10-CM | POA: Diagnosis not present

## 2020-03-28 DIAGNOSIS — Z8542 Personal history of malignant neoplasm of other parts of uterus: Secondary | ICD-10-CM | POA: Insufficient documentation

## 2020-03-28 DIAGNOSIS — Z9119 Patient's noncompliance with other medical treatment and regimen: Secondary | ICD-10-CM | POA: Diagnosis not present

## 2020-03-28 DIAGNOSIS — E876 Hypokalemia: Secondary | ICD-10-CM

## 2020-03-28 DIAGNOSIS — Z7982 Long term (current) use of aspirin: Secondary | ICD-10-CM | POA: Insufficient documentation

## 2020-03-28 DIAGNOSIS — Z1231 Encounter for screening mammogram for malignant neoplasm of breast: Secondary | ICD-10-CM

## 2020-03-28 DIAGNOSIS — I313 Pericardial effusion (noninflammatory): Secondary | ICD-10-CM | POA: Diagnosis not present

## 2020-03-28 DIAGNOSIS — I3139 Other pericardial effusion (noninflammatory): Secondary | ICD-10-CM

## 2020-03-28 DIAGNOSIS — Z794 Long term (current) use of insulin: Secondary | ICD-10-CM | POA: Diagnosis not present

## 2020-03-28 DIAGNOSIS — Z90710 Acquired absence of both cervix and uterus: Secondary | ICD-10-CM | POA: Diagnosis not present

## 2020-03-28 DIAGNOSIS — E1165 Type 2 diabetes mellitus with hyperglycemia: Secondary | ICD-10-CM | POA: Diagnosis not present

## 2020-03-28 DIAGNOSIS — Z791 Long term (current) use of non-steroidal anti-inflammatories (NSAID): Secondary | ICD-10-CM | POA: Insufficient documentation

## 2020-03-28 DIAGNOSIS — I10 Essential (primary) hypertension: Secondary | ICD-10-CM

## 2020-03-28 DIAGNOSIS — I2510 Atherosclerotic heart disease of native coronary artery without angina pectoris: Secondary | ICD-10-CM | POA: Insufficient documentation

## 2020-03-28 DIAGNOSIS — Z79899 Other long term (current) drug therapy: Secondary | ICD-10-CM | POA: Insufficient documentation

## 2020-03-28 DIAGNOSIS — Z23 Encounter for immunization: Secondary | ICD-10-CM

## 2020-03-28 DIAGNOSIS — Z6841 Body Mass Index (BMI) 40.0 and over, adult: Secondary | ICD-10-CM | POA: Diagnosis not present

## 2020-03-28 LAB — C-REACTIVE PROTEIN: CRP: 39 mg/L — ABNORMAL HIGH (ref 0–10)

## 2020-03-28 LAB — SEDIMENTATION RATE: Sed Rate: 37 mm/hr (ref 0–40)

## 2020-03-28 MED ORDER — LEVOFLOXACIN 500 MG PO TABS: 500.0000 mg | ORAL_TABLET | Freq: Every day | ORAL | 0 refills | Status: DC

## 2020-03-28 MED ORDER — EMPAGLIFLOZIN 10 MG PO TABS: 10.0000 mg | ORAL_TABLET | Freq: Every day | ORAL | 4 refills | Status: DC

## 2020-03-28 MED ORDER — POTASSIUM CHLORIDE ER 10 MEQ PO TBCR: 20.0000 meq | EXTENDED_RELEASE_TABLET | Freq: Every day | ORAL | 5 refills | Status: DC

## 2020-03-28 MED ORDER — LANTUS SOLOSTAR 100 UNIT/ML ~~LOC~~ SOPN: PEN_INJECTOR | SUBCUTANEOUS | 6 refills | Status: DC

## 2020-03-28 MED FILL — JARDIANCE 10 MG TABLET: 10 | 30 days supply | Qty: 30 | Fill #0

## 2020-03-28 MED FILL — levoFLOXacin 500 MG TABS: 500 | 7 days supply | Qty: 7 | Fill #0

## 2020-03-28 NOTE — Patient Instructions (Addendum)
Take the antibiotics called Levaquin once a day for 7 days.  Your blood sugars are uncontrolled.  Please make sure that you take the NovoLog consistently with your meals.  Increase Lantus insulin to 58 units daily.  If after 2 days your morning blood sugars are still higher than 140, then increase the Lantus to 60 units daily until you see me next month.  We have increase the potassium to 2 tablets daily.  Stop the Advil.  Influenza Virus Vaccine injection (Fluarix) What is this medicine? INFLUENZA VIRUS VACCINE (in floo EN zuh VAHY ruhs vak SEEN) helps to reduce the risk of getting influenza also known as the flu. This medicine may be used for other purposes; ask your health care provider or pharmacist if you have questions. COMMON BRAND NAME(S): Fluarix, Fluzone What should I tell my health care provider before I take this medicine? They need to know if you have any of these conditions:  bleeding disorder like hemophilia  fever or infection  Guillain-Barre syndrome or other neurological problems  immune system problems  infection with the human immunodeficiency virus (HIV) or AIDS  low blood platelet counts  multiple sclerosis  an unusual or allergic reaction to influenza virus vaccine, eggs, chicken proteins, latex, gentamicin, other medicines, foods, dyes or preservatives  pregnant or trying to get pregnant  breast-feeding How should I use this medicine? This vaccine is for injection into a muscle. It is given by a health care professional. A copy of Vaccine Information Statements will be given before each vaccination. Read this sheet carefully each time. The sheet may change frequently. Talk to your pediatrician regarding the use of this medicine in children. Special care may be needed. Overdosage: If you think you have taken too much of this medicine contact a poison control center or emergency room at once. NOTE: This medicine is only for you. Do not share this medicine  with others. What if I miss a dose? This does not apply. What may interact with this medicine?  chemotherapy or radiation therapy  medicines that lower your immune system like etanercept, anakinra, infliximab, and adalimumab  medicines that treat or prevent blood clots like warfarin  phenytoin  steroid medicines like prednisone or cortisone  theophylline  vaccines This list may not describe all possible interactions. Give your health care provider a list of all the medicines, herbs, non-prescription drugs, or dietary supplements you use. Also tell them if you smoke, drink alcohol, or use illegal drugs. Some items may interact with your medicine. What should I watch for while using this medicine? Report any side effects that do not go away within 3 days to your doctor or health care professional. Call your health care provider if any unusual symptoms occur within 6 weeks of receiving this vaccine. You may still catch the flu, but the illness is not usually as bad. You cannot get the flu from the vaccine. The vaccine will not protect against colds or other illnesses that may cause fever. The vaccine is needed every year. What side effects may I notice from receiving this medicine? Side effects that you should report to your doctor or health care professional as soon as possible:  allergic reactions like skin rash, itching or hives, swelling of the face, lips, or tongue Side effects that usually do not require medical attention (report to your doctor or health care professional if they continue or are bothersome):  fever  headache  muscle aches and pains  pain, tenderness, redness, or swelling  at site where injected  weak or tired This list may not describe all possible side effects. Call your doctor for medical advice about side effects. You may report side effects to FDA at 1-800-FDA-1088. Where should I keep my medicine? This vaccine is only given in a clinic, pharmacy,  doctor's office, or other health care setting and will not be stored at home. NOTE: This sheet is a summary. It may not cover all possible information. If you have questions about this medicine, talk to your doctor, pharmacist, or health care provider.  2020 Elsevier/Gold Standard (2007-12-13 09:30:40)

## 2020-03-28 NOTE — Progress Notes (Signed)
Patient presents for vaccination against influenza per orders of Dr. Johnson. Consent given. Counseling provided. No contraindications exists. Vaccine administered without incident.  ° °Luke Van Ausdall, PharmD, CPP °Clinical Pharmacist °[facility] °[phone] ° °

## 2020-03-28 NOTE — Progress Notes (Signed)
Patient ID: Kimberly Frazier, female    DOB: Jan 27, 1961  MRN: [id number]  CC: Hospitalization Follow-up (ED)   Subjective: Kimberly Frazier is a 59 y.o. female who presents for  ER f/u Her concerns today include:  Patient with history of diabetes with neuropathyand retinopathy, HTN with hypertensive heart disease, CAD(Coronary CT revealed occlusion in the mid to distal LAD, distal RCA.Cardiology states that she will need CABG eventually),HL,obesity,endometrial CA s/p hysterectomy.  Presents for ER f/u where she was seen 10/26 for LT sided pleuritic CP under the breast x 6 days.  D-dimer was positive.  Troponin negative.  Chest x-ray revealed atelectasis versus consolidation at the left lung base with small pleural effusion.  CTA of the chest was negative for PE.  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 COVID neg. Patient discharge with oxycodone and told to follow-up with her cardiologist for possible pericarditis.  She saw Dr. Harrell Gave yesterday.  Patient states she was not too concerned about the pericardial effusion and was told that that will resolve over time.  ESR nl, CRP elev at 39..She encouraged her to follow-up with me for treatment for possible pneumonia.  Today: Denies fever Reports 2 episodes of vomiting prior to ER, endorses cough, + SOB "when I get to hurting."  Taking Advil 1-2 times a day.  Can not tolerate Oxycodone because it upsets her stomach  HTN:  BP elev today.  She has not taken any of her meds as yet for today.  K+ level low at 3 on recent ER visit.  Reports compliance with taking Potassium supplement   DM:  Farxiga changed to jardiance due to insurance coverage. Has not picked up Jardiance as yet Taking Lantus 54 units (suppose to be 60 units  daily).  When I last spoke with her, she as not taking Novolog consistently with meals. Taking  Novolog 25 units more consistently with meals now. Checks BS before BF and sometimes at bed time.  A.m range over 200.  Home BS this a.m was 287, yesterday 486  HM:  Due for flu shot. Completed Pfizer COVID vaccine 01/22/20 and 02/21/20.  Referred for MMG on last visit.  Reports she was not called.    Patient Active Problem List   Diagnosis Date Noted  . New onset headache 12/21/2019  . Cerebral vascular disease 12/21/2019  . Migraine without aura and without status migrainosus, not intractable 11/12/2019  . Endometrial cancer ([place]) 11/28/2018  . Hematuria 11/28/2018  . 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 History of radiation therapy 11/28/2018  . Diabetes mellitus ([place]) 10/06/2018  . Type 2 diabetes mellitus with hyperglycemia, with long-term current use of insulin ([place]) 10/06/2018  . Adrenal adenoma, left 06/15/2018  . 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 Postmenopausal vaginal bleeding 06/15/2018  . Hyperkalemia   . Diabetic peripheral neuropathy ([place]) 09/03/2016  . CAD (coronary artery disease) 04/10/2015  . Severe obesity (BMI >= 40) ([place]) 09/20/2013  . 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 Hyperlipidemia 10/25/2012  . Diabetes mellitus type 2, uncontrolled, with complications ([place]) 75/17/0017  . Essential hypertension 07/26/2012     Current Outpatient Medications on File Prior to Visit  Medication Sig Dispense Refill  . Accu-Chek Softclix Lancets lancets Use to check blood sugar 3 times daily. 100 each 6  . albuterol (VENTOLIN HFA) 108 (90 Base)  MCG/ACT inhaler Inhale 2 puffs into the lungs every 6 (six) hours as needed for wheezing or shortness of breath. 18 g 0  . amLODipine (NORVASC) 5 MG tablet TAKE 1 TABLET (5 MG TOTAL) BY MOUTH DAILY. 30 tablet 2  . aspirin EC 81 MG tablet Take 1 tablet (81 mg total) daily by mouth. 100 tablet 1  . atorvastatin (LIPITOR) 40 MG tablet  TAKE 1 TABLET DAILY BY MOUTH. 30 tablet 2  . Blood Glucose Monitoring Suppl (ACCU-CHEK GUIDE) w/Device KIT 1 kit by Does not apply route in the morning, at noon, and at bedtime. Use to check blood sugar 3 times daily. 1 kit 0  . Bromfenac Sodium (PROLENSA) 0.07 % SOLN Place 1 drop into both eyes 4 (four) times daily. 6 mL 0  . butalbital-acetaminophen-caffeine (FIORICET) 50-325-40 MG tablet Take 1 tablet by mouth every 6 (six) hours as needed for headache. 12 tablet 3  . carvedilol (COREG) 25 MG tablet TAKE 1 TABLET (25 MG TOTAL) BY MOUTH 2 (TWO) TIMES DAILY WITH A MEAL. 60 tablet 0  . glucose blood (ACCU-CHEK GUIDE) test strip Use to check blood sugar 3 times daily. 100 each 6  . insulin aspart (NOVOLOG) 100 UNIT/ML injection Inject 28 Units into the skin 3 (three) times daily before meals. (Patient taking differently: Inject 24 Units into the skin 3 (three) times daily before meals. ) 20 mL 11  . Insulin Syringe-Needle U-100 (TRUEPLUS INSULIN SYRINGE) 31G X 5/16" 0.3 ML MISC Use to inject Humalog three times daily. 100 each 11  . isosorbide mononitrate (IMDUR) 60 MG 24 hr tablet TAKE 1 TABLET (60 MG TOTAL) BY MOUTH DAILY. 90 tablet 3  . ketorolac (ACULAR) 0.5 % ophthalmic solution Place 1 drop into both eyes 4 (four) times daily. 10 mL 4  . nitroGLYCERIN (NITROSTAT) 0.4 MG SL tablet Place 1 tab under tongue for chest pain.  May repeat after 5 minutes x 2.  DO NOT TAKE MORE THAN 3 TABS DURING AN EPISODE OF CHEST PAIN (Patient taking differently: Place 0.4 mg under the tongue every 5 (five) minutes as needed for chest pain. DO NOT TAKE MORE THAN 3 TABS DURING AN EPISODE OF CHEST PAIN) 25 tablet 6  . oxyCODONE-acetaminophen (PERCOCET/ROXICET) 5-325 MG tablet Take 1-2 tablets by mouth every 6 (six) hours as needed for severe pain. 8 tablet 0  . prednisoLONE acetate (PRED FORTE) 1 % ophthalmic suspension Place 1 drop into both eyes 4 (four) times daily. 10 mL 4  . tiZANidine (ZANAFLEX) 4 MG tablet Take 1  tablet (4 mg total) by mouth every 6 (six) hours as needed for muscle spasms. 30 tablet 6  . topiramate (TOPAMAX) 50 MG tablet Take 1 tablet (50 mg total) by mouth 2 (two) times daily. 60 tablet 6  . TRUEPLUS PEN NEEDLES 32G X 4 MM MISC USE AS DIRECTED WITH INSULIN 100 each 0   No current facility-administered medications on file prior to visit.    No Known Allergies  Social History   Socioeconomic History  . Marital status: Single    Spouse name: Not on file  . Number of children: 2  . Years of education: 32  . Highest education level: Not on file  Occupational History  . Occupation: retired  Tobacco Use  . Smoking status: Never Smoker  . Smokeless tobacco: Never Used  Vaping Use  . Vaping Use: Never used  Substance and Sexual Activity  . Alcohol use: Not Currently  . Drug use: Not Currently  Types: Marijuana    Comment: 07-28-2018  per pt last smoked 11/21/2018  . Sexual activity: Yes  Other Topics Concern  . Not on file  Social History Narrative   Lives at home with her grandson.   Right-handed.   No daily caffeine use.   Social Determinants of Health   Financial Resource Strain:   . Difficulty of Paying Living Expenses: Not on file  Food Insecurity:   . Worried About Charity fundraiser in the Last Year: Not on file  . Ran Out of Food in the Last Year: Not on file  Transportation Needs:   . Lack of Transportation (Medical): Not on file  . Lack of Transportation (Non-Medical): Not on file  Physical Activity:   . Days of Exercise per Week: Not on file  . Minutes of Exercise per Session: Not on file  Stress:   . Feeling of Stress : Not on file  Social Connections:   . Frequency of Communication with Friends and Family: Not on file  . Frequency of Social Gatherings with Friends and Family: Not on file  . Attends Religious Services: Not on file  . Active Member of Clubs or Organizations: Not on file  . Attends Archivist Meetings: Not on file  .  Marital Status: Not on file  Intimate Partner Violence:   . Fear of Current or Ex-Partner: Not on file  . Emotionally Abused: Not on file  . Physically Abused: Not on file  . Sexually Abused: Not on file    Family History  Problem Relation Age of Onset  . Heart disease Mother   . Hypertension Mother   . Diabetes Mother   . Cancer Father        hodgkins lymphoma  . Heart disease Sister        heart attack  . Diabetes Sister   . Cancer Other        parent  . Diabetes Other        parent  . Heart disease Other        parent  . Hyperlipidemia Other        parent  . Hypertension Other        parent  . Arthritis Other        parent  . Diabetes Maternal Aunt   . Diabetes Maternal Uncle   . Breast cancer Neg Hx     Past Surgical History:  Procedure Laterality Date  . BREAST BIOPSY  2012   benign  . CARDIAC CATHETERIZATION N/A 02/06/2015   Procedure: Left Heart Cath and Coronary Angiography;  Surgeon: Larey Dresser, MD;  Location: [facility];  Service: Cardiovascular;  Laterality: N/A;  . CATARACT EXTRACTION Bilateral OD: 03/07/19, OS: 02/28/19   Dr. Gershon Crane  . CATARACT EXTRACTION, BILATERAL    . EYE SURGERY Bilateral OD: 03/07/19, OS: 02/28/19   Cat Sx - Dr. Gershon Crane  . HYSTEROSCOPY WITH D & C N/A 08/01/2018   Procedure: DILATATION AND CURETTAGE /HYSTEROSCOPY;  Surgeon: Mora Bellman, MD;  Location: [facility];  Service: Gynecology;  Laterality: N/A;  . ROBOTIC ASSISTED TOTAL HYSTERECTOMY WITH BILATERAL SALPINGO OOPHERECTOMY N/A 11/28/2018   Procedure: XI ROBOTIC ASSISTED TOTAL HYSTERECTOMY WITH BILATERAL SALPINGO OOPHORECTOMY;  Surgeon: Everitt Amber, MD;  Location: [facility];  Service: Gynecology;  Laterality: N/A;  . SENTINEL NODE BIOPSY N/A 11/28/2018   Procedure: SENTINEL NODE BIOPSY;  Surgeon: Everitt Amber, MD;  Location: [facility];  Service: Gynecology;  Laterality:  N/A;  . UMBILICAL HERNIA REPAIR  child    ROS: Review of Systems Negative except as  stated above  PHYSICAL EXAM: BP (!) 161/110   Pulse 97   Resp 16   Wt 286 lb 9.6 oz (130 kg)   SpO2 97%   BMI 49.19 kg/m   Wt Readings from Last 3 Encounters:  03/28/20 286 lb 9.6 oz (130 kg)  03/27/20 283 lb (128.4 kg)  12/21/19 (!) 285 lb (129.3 kg)     Physical Exam  General appearance - alert, well appearing,morbidly obese older AAF and in no distress Mental status - normal mood, behavior, speech, dress, motor activity, and thought processes Neck - supple, no significant adenopathy.  Pulsating carotid on RT Chest - fine crackles heard LT lung base Heart - normal rate, regular rhythm, normal S1, S2, no murmurs, rubs, clicks or gallops Extremities - no Le edema Diabetic Foot Exam - Simple   Simple Foot Form Visual Inspection No deformities, no ulcerations, no other skin breakdown bilaterally: Yes Sensation Testing See comments: Yes Pulse Check Posterior Tibialis and Dorsalis pulse intact bilaterally: Yes Comments Decrease sensation on plantar surface BL      CMP Latest Ref Rng & Units 03/24/2020 01/15/2020 09/14/2019  Glucose 70 - 99 mg/dL 348(H) 201(H) 172(H)  BUN 8 - 23 mg/dL _0 Creatinine 0.44 - 1.00 mg/dL 1.23(H) 1.13(H) 0.99  Sodium 135 - 145 mmol/L 137 142 143  Potassium 3.5 - 5.1 mmol/L 3.0(L) 3.5 3.8  Chloride 98 - 111 mmol/L 100 102 104  CO2 22 - 32 mmol/L _1 Calcium 8.9 - 10.3 mg/dL 8.8(L) 9.3 9.2  Total Protein 6.0 - 8.5 g/dL - - 7.0  Total Bilirubin 0.0 - 1.2 mg/dL - - 0.5  Alkaline Phos 39 - 117 IU/L - - 126(H)  AST 0 - 40 IU/L - - 17  ALT 0 - 32 IU/L - - 13   Lipid Panel     Component Value Date/Time   CHOL 156 03/07/2020 0925   TRIG 130 03/07/2020 0925   HDL 41 03/07/2020 0925   CHOLHDL 3.8 03/07/2020 0925   CHOLHDL 5 07/15/2015 1513   VLDL 44.0 (H) 07/15/2015 1513   LDLCALC 92 03/07/2020 0925   LDLDIRECT 93.0 07/15/2015 1513    CBC    Component Value Date/Time   WBC 10.9 (H) 03/24/2020 1427   RBC 4.50 03/24/2020 1427     HGB 12.7 03/24/2020 1427   HGB 13.4 06/15/2018 1056   HCT 39.4 03/24/2020 1427   HCT 40.3 06/15/2018 1056   PLT 247 03/24/2020 1427   PLT 229 06/15/2018 1056   MCV 87.6 03/24/2020 1427   MCV 88 06/15/2018 1056   MCH 28.2 03/24/2020 1427   MCHC 32.2 03/24/2020 1427   RDW 14.2 03/24/2020 1427   RDW 13.6 06/15/2018 1056   LYMPHSABS 1.0 12/30/2017 0812   MONOABS 1.3 (H) 12/30/2017 0812   EOSABS 0.1 12/30/2017 0812   BASOSABS 0.0 12/30/2017 0812    ASSESSMENT AND PLAN: 1. Community acquired pneumonia of left lower lobe of lung - levofloxacin (LEVAQUIN) 500 MG tablet; Take 1 tablet (500 mg total) by mouth daily.  Dispense: 7 tablet; Refill: 0  2. Essential hypertension Not at goal but she has not taken meds as yet for the morning. Pt to take meds when she returns home  3. Type 2 diabetes mellitus with hyperglycemia, with long-term current use of insulin (HCC) Not at goal.  Issues with noncompliance in  the past and I suspect the same.  Advised to increase Lantus to 58 units; if after 2 days, morning BS still >140, advise to increase Lantus to 60 units until she sees me again next mth.  Advise to pick up jardiance and start taking. - empagliflozin (JARDIANCE) 10 MG TABS tablet; Take 1 tablet (10 mg total) by mouth daily.  Dispense: 30 tablet; Refill: 4 - insulin glargine (LANTUS SOLOSTAR) 100 UNIT/ML Solostar Pen; INJECT 58 UNITS INTO THE SKIN DAILY.  Dispense: 45 mL; Refill: 6  4. Severe obesity (BMI >= 40) (HCC) Dietary counseling given  5. Pericardial effusion Seen by cardiology  6. Hypokalemia Increase K supplement to 20 meq daily - potassium chloride (KLOR-CON) 10 MEQ tablet; Take 2 tablets (20 mEq total) by mouth daily.  Dispense: 60 tablet; Refill: 5  7. Need for influenza vaccination given  8. Encounter for screening mammogram for malignant neoplasm of breast - MM Digital Screening; Future     Patient was given the opportunity to ask questions.  Patient verbalized  understanding of the plan and was able to repeat key elements of the plan.   Orders Placed This Encounter  Procedures  . MM Digital Screening     Requested Prescriptions   Signed Prescriptions Disp Refills  . levofloxacin (LEVAQUIN) 500 MG tablet 7 tablet 0    Sig: Take 1 tablet (500 mg total) by mouth daily.  . potassium chloride (KLOR-CON) 10 MEQ tablet 60 tablet 5    Sig: Take 2 tablets (20 mEq total) by mouth daily.  . empagliflozin (JARDIANCE) 10 MG TABS tablet 30 tablet 4    Sig: Take 1 tablet (10 mg total) by mouth daily.  . insulin glargine (LANTUS SOLOSTAR) 100 UNIT/ML Solostar Pen 45 mL 6    Sig: INJECT 58 UNITS INTO THE SKIN DAILY.    No follow-ups on file.  Karle Plumber, MD, FACP

## 2020-03-31 ENCOUNTER — Encounter: Payer: Self-pay | Admitting: Neurology

## 2020-03-31 ENCOUNTER — Ambulatory Visit: Payer: Medicare Other | Admitting: Neurology

## 2020-03-31 NOTE — Progress Notes (Deleted)
PATIENT: Kimberly Frazier DOB: [birth year]  REASON FOR VISIT: follow up HISTORY FROM: patient  HISTORY OF PRESENT ILLNESS: Today 03/31/20  HISTORY  Kimberly Frazier is a 59 year old female, seen in request by primary care physician Dr. Karle Plumber for evaluation of left-sided headache, initial evaluation was on December 21, 2019  I reviewed and summarized the referring note.PMH HTN DM 20 years, insulin dependent CKD CAD  She denies a previous history of headache, her mother, and sister both suffered migraine, since 2020, she began to have frequent headaches, it seems to always at the left side, starting from left nuchal area, spreading forward, sometimes with light noise sensitivity, nauseous, she preferred to lie down in a dark quiet room resting for few hours, sleep usually help her headache  Over the past 1 year, she has been taking titrating dose of daily Advil or Tylenol up to 3-4 doses each day with limited help, worry about long-term side effect, she has quit frequent over-the-counter use around April 2021,  She seems to have mild improvement, but still has daily mild left side pressure headaches, couple times each months, it would exacerbated to a more severe headache with light noise sensitivity  She also reported significant weight gain over the past 2 months, 30 pounds over 2 months, lack of physical activity, denies chewing difficulty, no jaw claudication  We personally reviewed MRI brain on November 29 2019: Mild supratentorium small vessel disease  Laboratory evaluation in June 2021, negative Covid testing, A1c was elevated 9.4, liver functional test showed no significant abnormality, lipid panel showed LDL 75, BMP showed elevated glucose 172, creatinine 0.99  Update March 31, 2020 SS:   REVIEW OF SYSTEMS: Out of a complete 14 system review of symptoms, the patient complains only of the following symptoms, and all other reviewed systems are negative.  ALLERGIES: No  Known Allergies  HOME MEDICATIONS: Outpatient Medications Prior to Visit  Medication Sig Dispense Refill  . Accu-Chek Softclix Lancets lancets Use to check blood sugar 3 times daily. 100 each 6  . albuterol (VENTOLIN HFA) 108 (90 Base) MCG/ACT inhaler Inhale 2 puffs into the lungs every 6 (six) hours as needed for wheezing or shortness of breath. 18 g 0  . amLODipine (NORVASC) 5 MG tablet TAKE 1 TABLET (5 MG TOTAL) BY MOUTH DAILY. 30 tablet 2  . aspirin EC 81 MG tablet Take 1 tablet (81 mg total) daily by mouth. 100 tablet 1  . atorvastatin (LIPITOR) 40 MG tablet TAKE 1 TABLET DAILY BY MOUTH. 30 tablet 2  . Blood Glucose Monitoring Suppl (ACCU-CHEK GUIDE) w/Device KIT 1 kit by Does not apply route in the morning, at noon, and at bedtime. Use to check blood sugar 3 times daily. 1 kit 0  . Bromfenac Sodium (PROLENSA) 0.07 % SOLN Place 1 drop into both eyes 4 (four) times daily. 6 mL 0  . butalbital-acetaminophen-caffeine (FIORICET) 50-325-40 MG tablet Take 1 tablet by mouth every 6 (six) hours as needed for headache. 12 tablet 3  . carvedilol (COREG) 25 MG tablet TAKE 1 TABLET (25 MG TOTAL) BY MOUTH 2 (TWO) TIMES DAILY WITH A MEAL. 60 tablet 0  . empagliflozin (JARDIANCE) 10 MG TABS tablet Take 1 tablet (10 mg total) by mouth daily. 30 tablet 4  . glucose blood (ACCU-CHEK GUIDE) test strip Use to check blood sugar 3 times daily. 100 each 6  . insulin aspart (NOVOLOG) 100 UNIT/ML injection Inject 28 Units into the skin 3 (three) times daily before meals. (Patient  taking differently: Inject 24 Units into the skin 3 (three) times daily before meals. ) 20 mL 11  . insulin glargine (LANTUS SOLOSTAR) 100 UNIT/ML Solostar Pen INJECT 58 UNITS INTO THE SKIN DAILY. 45 mL 6  . Insulin Syringe-Needle U-100 (TRUEPLUS INSULIN SYRINGE) 31G X 5/16" 0.3 ML MISC Use to inject Humalog three times daily. 100 each 11  . isosorbide mononitrate (IMDUR) 60 MG 24 hr tablet TAKE 1 TABLET (60 MG TOTAL) BY MOUTH DAILY. 90  tablet 3  . ketorolac (ACULAR) 0.5 % ophthalmic solution Place 1 drop into both eyes 4 (four) times daily. 10 mL 4  . levofloxacin (LEVAQUIN) 500 MG tablet Take 1 tablet (500 mg total) by mouth daily. 7 tablet 0  . nitroGLYCERIN (NITROSTAT) 0.4 MG SL tablet Place 1 tab under tongue for chest pain.  May repeat after 5 minutes x 2.  DO NOT TAKE MORE THAN 3 TABS DURING AN EPISODE OF CHEST PAIN (Patient taking differently: Place 0.4 mg under the tongue every 5 (five) minutes as needed for chest pain. DO NOT TAKE MORE THAN 3 TABS DURING AN EPISODE OF CHEST PAIN) 25 tablet 6  . oxyCODONE-acetaminophen (PERCOCET/ROXICET) 5-325 MG tablet Take 1-2 tablets by mouth every 6 (six) hours as needed for severe pain. 8 tablet 0  . potassium chloride (KLOR-CON) 10 MEQ tablet Take 2 tablets (20 mEq total) by mouth daily. 60 tablet 5  . prednisoLONE acetate (PRED FORTE) 1 % ophthalmic suspension Place 1 drop into both eyes 4 (four) times daily. 10 mL 4  . tiZANidine (ZANAFLEX) 4 MG tablet Take 1 tablet (4 mg total) by mouth every 6 (six) hours as needed for muscle spasms. 30 tablet 6  . topiramate (TOPAMAX) 50 MG tablet Take 1 tablet (50 mg total) by mouth 2 (two) times daily. 60 tablet 6  . TRUEPLUS PEN NEEDLES 32G X 4 MM MISC USE AS DIRECTED WITH INSULIN 100 each 0   No facility-administered medications prior to visit.    PAST MEDICAL HISTORY: Past Medical History:  Diagnosis Date  . Adrenal adenoma, left   . Arthritis   . Cancer ([place])   . CKD (chronic kidney disease), stage II   . Coronary artery disease 07-28-2018 [facility])  currently due to no insurance   per cardiac cath 02-06-2015 (positive mild lateral ishcemia on stress test)--- dLAD 80%,  mLAD 40%,  mPDA 80% (small vessel),  ostial D1 70%,  CFx with lumial irregarlities-- medical management  . Depression   . Diabetic neuropathy ([place])   . Diabetic retinopathy ([place])    NPDR OU  . Headache   . History of Bell's palsy  07/2011   per pt residual facial pain on left side occasionally  . History of cancer of vagina 1999   per pt completed radiation and chemo  . History of sepsis 12/30/2017   positive blood culter fro E.coli  . Hyperlipidemia   . Hypertension   . Hypertensive retinopathy    OU  . Hypokalemia   . Insulin dependent type 2 diabetes mellitus, uncontrolled ([place])    followed by pcp---  A1c was 11.7 on 06-15-2018 in epic  . Myocardial infarction ([place])   . Nocturia   . Peripheral neuropathy   . PMB (postmenopausal bleeding)   . Wears dentures    upper  . Wears glasses     PAST SURGICAL HISTORY: Past Surgical History:  Procedure Laterality Date  . BREAST BIOPSY  2012   benign  .  CARDIAC CATHETERIZATION N/A 02/06/2015   Procedure: Left Heart Cath and Coronary Angiography;  Surgeon: Larey Dresser, MD;  Location: [facility];  Service: Cardiovascular;  Laterality: N/A;  . CATARACT EXTRACTION Bilateral OD: 03/07/19, OS: 02/28/19   Dr. Gershon Crane  . CATARACT EXTRACTION, BILATERAL    . EYE SURGERY Bilateral OD: 03/07/19, OS: 02/28/19   Cat Sx - Dr. Gershon Crane  . HYSTEROSCOPY WITH D & C N/A 08/01/2018   Procedure: DILATATION AND CURETTAGE /HYSTEROSCOPY;  Surgeon: Mora Bellman, MD;  Location: [facility];  Service: Gynecology;  Laterality: N/A;  . ROBOTIC ASSISTED TOTAL HYSTERECTOMY WITH BILATERAL SALPINGO OOPHERECTOMY N/A 11/28/2018   Procedure: XI ROBOTIC ASSISTED TOTAL HYSTERECTOMY WITH BILATERAL SALPINGO OOPHORECTOMY;  Surgeon: Everitt Amber, MD;  Location: [facility];  Service: Gynecology;  Laterality: N/A;  . SENTINEL NODE BIOPSY N/A 11/28/2018   Procedure: SENTINEL NODE BIOPSY;  Surgeon: Everitt Amber, MD;  Location: [facility];  Service: Gynecology;  Laterality: N/A;  . UMBILICAL HERNIA REPAIR  child    FAMILY HISTORY: Family History  Problem Relation Age of Onset  . Heart disease Mother   . Hypertension Mother   . Diabetes Mother   . Cancer Father        hodgkins lymphoma  .  Heart disease Sister        heart attack  . Diabetes Sister   . Cancer Other        parent  . Diabetes Other        parent  . Heart disease Other        parent  . Hyperlipidemia Other        parent  . Hypertension Other        parent  . Arthritis Other        parent  . Diabetes Maternal Aunt   . Diabetes Maternal Uncle   . Breast cancer Neg Hx     SOCIAL HISTORY: Social History   Socioeconomic History  . Marital status: Single    Spouse name: Not on file  . Number of children: 2  . Years of education: 90  . Highest education level: Not on file  Occupational History  . Occupation: retired  Tobacco Use  . Smoking status: Never Smoker  . Smokeless tobacco: Never Used  Vaping Use  . Vaping Use: Never used  Substance and Sexual Activity  . Alcohol use: Not Currently  . Drug use: Not Currently    Types: Marijuana    Comment: 07-28-2018  per pt last smoked 11/21/2018  . Sexual activity: Yes  Other Topics Concern  . Not on file  Social History Narrative   Lives at home with her grandson.   Right-handed.   No daily caffeine use.   Social Determinants of Health   Financial Resource Strain:   . Difficulty of Paying Living Expenses: Not on file  Food Insecurity:   . Worried About Charity fundraiser in the Last Year: Not on file  . Ran Out of Food in the Last Year: Not on file  Transportation Needs:   . Lack of Transportation (Medical): Not on file  . Lack of Transportation (Non-Medical): Not on file  Physical Activity:   . Days of Exercise per Week: Not on file  . Minutes of Exercise per Session: Not on file  Stress:   . Feeling of Stress : Not on file  Social Connections:   . Frequency of Communication with Friends and Family: Not on file  .  Frequency of Social Gatherings with Friends and Family: Not on file  . Attends Religious Services: Not on file  . Active Member of Clubs or Organizations: Not on file  . Attends Archivist Meetings: Not on file    . Marital Status: Not on file  Intimate Partner Violence:   . Fear of Current or Ex-Partner: Not on file  . Emotionally Abused: Not on file  . Physically Abused: Not on file  . Sexually Abused: Not on file      PHYSICAL EXAM  There were no vitals filed for this visit. There is no height or weight on file to calculate BMI.  Generalized: Well developed, in no acute distress   Neurological examination  Mentation: Alert oriented to time, place, history taking. Follows all commands speech and language fluent Cranial nerve II-XII: Pupils were equal round reactive to light. Extraocular movements were full, visual field were full on confrontational test. Facial sensation and strength were normal. Uvula tongue midline. Head turning and shoulder shrug  were normal and symmetric. Motor: The motor testing reveals 5 over 5 strength of all 4 extremities. Good symmetric motor tone is noted throughout.  Sensory: Sensory testing is intact to soft touch on all 4 extremities. No evidence of extinction is noted.  Coordination: Cerebellar testing reveals good finger-nose-finger and heel-to-shin bilaterally.  Gait and station: Gait is normal. Tandem gait is normal. Romberg is negative. No drift is seen.  Reflexes: Deep tendon reflexes are symmetric and normal bilaterally.   DIAGNOSTIC DATA (LABS, IMAGING, TESTING) - I reviewed patient records, labs, notes, testing and imaging myself where available.  Lab Results  Component Value Date   WBC 10.9 (H) 03/24/2020   HGB 12.7 03/24/2020   HCT 39.4 03/24/2020   MCV 87.6 03/24/2020   PLT 247 03/24/2020      Component Value Date/Time   NA 137 03/24/2020 1427   NA 142 01/15/2020 1454   K 3.0 (L) 03/24/2020 1427   CL 100 03/24/2020 1427   CO2 28 03/24/2020 1427   GLUCOSE 348 (H) 03/24/2020 1427   BUN 15 03/24/2020 1427   BUN 19 01/15/2020 1454   CREATININE 1.23 (H) 03/24/2020 1427   CALCIUM 8.8 (L) 03/24/2020 1427   PROT 7.0 09/14/2019 0934    ALBUMIN 4.0 09/14/2019 0934   AST 17 09/14/2019 0934   ALT 13 09/14/2019 0934   ALKPHOS 126 (H) 09/14/2019 0934   BILITOT 0.5 09/14/2019 0934   GFRNONAA 50 (L) 03/24/2020 1427   GFRAA 61 01/15/2020 1454   Lab Results  Component Value Date   CHOL 156 03/07/2020   HDL 41 03/07/2020   LDLCALC 92 03/07/2020   LDLDIRECT 93.0 07/15/2015   TRIG 130 03/07/2020   CHOLHDL 3.8 03/07/2020   Lab Results  Component Value Date   HGBA1C 9.4 (A) 11/12/2019   Lab Results  Component Value Date   [id number] 588 12/21/2019   Lab Results  Component Value Date   TSH 1.050 12/21/2019      ASSESSMENT AND PLAN 59 y.o. year old female  has a past medical history of Adrenal adenoma, left, Arthritis, Cancer ([place]), CKD (chronic kidney disease), stage II, Coronary artery disease (07-28-2018 [facility])  currently due to no insurance), Depression, Diabetic neuropathy ([place]), Diabetic retinopathy ([place]), Headache, History of Bell's palsy (07/2011), History of cancer of vagina (1999), History of sepsis (12/30/2017), Hyperlipidemia, Hypertension, Hypertensive retinopathy, Hypokalemia, Insulin dependent type 2 diabetes mellitus, uncontrolled ([place]), Myocardial infarction ([place]), Nocturia, Peripheral neuropathy,  PMB (postmenopausal bleeding), Wears dentures, and Wears glasses. here with:  1.  New onset headaches 2.  Cerebral small vessel disease -Migraine features, likely musculoskeletal component -MRI of the brain showed cerebral SVD, risk factors of obesity, HTN, HLD, DM that is poorly controlled -Laboratory evaluation revealed elevated CRP 20, sed rate was normal, CK, RPR, B12, TSH, ANA were unremarkable in July 2021, recently check again by PCP 03/27/20 CRP 39, ESR normal 37 (0-40), Being treated for PNA  I spent 15 minutes with the patient. 50% of this time was spent   Butler Denmark, [place], DNP 03/31/2020, 5:41 AM [facility] [address] [place], [place] [address] [phone]

## 2020-04-04 ENCOUNTER — Other Ambulatory Visit: Payer: Self-pay | Admitting: Internal Medicine

## 2020-04-04 DIAGNOSIS — I2510 Atherosclerotic heart disease of native coronary artery without angina pectoris: Secondary | ICD-10-CM

## 2020-04-04 DIAGNOSIS — I10 Essential (primary) hypertension: Secondary | ICD-10-CM

## 2020-04-04 MED FILL — CARVEDILOL 25 MG TABLET: 25 | 30 days supply | Qty: 60 | Fill #0

## 2020-04-08 ENCOUNTER — Other Ambulatory Visit: Payer: Self-pay | Admitting: Internal Medicine

## 2020-04-08 ENCOUNTER — Telehealth: Payer: Self-pay | Admitting: Internal Medicine

## 2020-04-08 DIAGNOSIS — J189 Pneumonia, unspecified organism: Secondary | ICD-10-CM

## 2020-04-08 DIAGNOSIS — I2510 Atherosclerotic heart disease of native coronary artery without angina pectoris: Secondary | ICD-10-CM

## 2020-04-08 DIAGNOSIS — I10 Essential (primary) hypertension: Secondary | ICD-10-CM

## 2020-04-08 MED FILL — ATORVASTATIN CALCIUM 40 MG: 40 | 30 days supply | Qty: 30 | Fill #0

## 2020-04-08 MED FILL — AMLODIPINE BESYLATE 5 MG TA: 5 | 30 days supply | Qty: 30 | Fill #0

## 2020-04-08 NOTE — Telephone Encounter (Signed)
Will forward to pcp

## 2020-04-08 NOTE — Telephone Encounter (Signed)
Copied from [place] [id number]. Topic: General - Inquiry >> Apr 07, 2020  3:18 PM Scherrie Gerlach wrote: Reason for CRM: pt states she does not think the abx Dr Wynetta Emery gave her was strong enough.  Pt states she is still having the same problem and it is hurting really bad

## 2020-04-09 NOTE — Telephone Encounter (Signed)
Contacted pt to go over Dr. Wynetta Emery response pt is aware

## 2020-04-18 ENCOUNTER — Ambulatory Visit: Payer: Medicare Other | Admitting: Internal Medicine

## 2020-04-21 ENCOUNTER — Other Ambulatory Visit: Payer: Self-pay

## 2020-04-21 ENCOUNTER — Ambulatory Visit (HOSPITAL_COMMUNITY): Payer: Medicare Other | Attending: Cardiology

## 2020-04-21 DIAGNOSIS — R0781 Pleurodynia: Secondary | ICD-10-CM | POA: Diagnosis not present

## 2020-04-21 DIAGNOSIS — I3139 Other pericardial effusion (noninflammatory): Secondary | ICD-10-CM

## 2020-04-21 DIAGNOSIS — I313 Pericardial effusion (noninflammatory): Secondary | ICD-10-CM | POA: Diagnosis not present

## 2020-04-21 LAB — ECHOCARDIOGRAM COMPLETE
AR max vel: 2.25 cm2
AV Peak grad: 11 mmHg
Ao pk vel: 1.66 m/s
Area-P 1/2: 2.87 cm2
S' Lateral: 3 cm

## 2020-04-21 MED ORDER — PERFLUTREN LIPID MICROSPHERE
1.0000 mL | INTRAVENOUS | Status: AC | PRN
  Administered 2020-04-21: 2 mL via INTRAVENOUS

## 2020-04-28 ENCOUNTER — Telehealth: Payer: Self-pay | Admitting: Cardiology

## 2020-04-28 NOTE — Telephone Encounter (Signed)
Returned call to patient-discussed echo results   Buford Dresser, MD  04/21/2020 1:27 PM EST     Echo shows that large amount of fluid is similar to prior, not better or worse. If she feels worse, gets more chest pain or shortness of breath, or if she develops feeling faint or passing out/blacking out, she should be seen urgently (if syncope or hypotension, should call 911). Thanks.   She states since last OV with Dr. Harrell Gave, she is overall feeling better.   She continues to have SOB with exertion and left sided chest pain when lying down.    She states PCP prescribed antibiotic that helped her as well (per chart review, treated for PNA).    She is wondering what she can do to get the fluid off of her heart and what to do now.   Discussed with her that this is something that would continued to be monitored but would be very important to notify if there is ANY change in her symptoms that could indication this is increasing/worsening.    Also encouraged follow up chest xray as ordered by PCP to f/u on PNA to make sure this has resolved and contributing to symptoms as well.     Advised would send to Dr. Harrell Gave to review for any further recommendations.   Patient verbalized understanding.

## 2020-04-28 NOTE — Telephone Encounter (Signed)
° °  Pt is returning call to get echo result °

## 2020-04-29 MED FILL — JARDIANCE 10 MG TABLET: 10 | 30 days supply | Qty: 30 | Fill #1

## 2020-05-02 NOTE — Telephone Encounter (Signed)
Pt updated and verbalized understanding.  

## 2020-05-02 NOTE — Telephone Encounter (Signed)
Agree with recommendations. Most important thing is to make sure pneumonia fully heals. Any very low blood pressures, passing out, or severe chest pain/shortness of breath should be seen in the ER to make sure things are ok.

## 2020-05-07 ENCOUNTER — Other Ambulatory Visit: Payer: Self-pay | Admitting: Pharmacist

## 2020-05-07 DIAGNOSIS — E1165 Type 2 diabetes mellitus with hyperglycemia: Secondary | ICD-10-CM

## 2020-05-07 DIAGNOSIS — Z794 Long term (current) use of insulin: Secondary | ICD-10-CM

## 2020-05-07 MED ORDER — "BD INSULIN SYRINGE 29G X 1/2"" 0.3 ML MISC": 2 refills | Status: DC

## 2020-05-07 MED FILL — ATORVASTATIN CALCIUM 40 MG: 40 | 30 days supply | Qty: 30 | Fill #1

## 2020-05-07 MED FILL — NovoLOG 100 UNIT/ML SOLN: 100 | 23 days supply | Qty: 20 | Fill #1

## 2020-05-08 MED FILL — BD INSULIN SYR 0.3 ML 6MMX3: 31G X 15/64 | 30 days supply | Qty: 100 | Fill #0

## 2020-05-09 ENCOUNTER — Telehealth: Payer: Self-pay | Admitting: Internal Medicine

## 2020-05-09 ENCOUNTER — Other Ambulatory Visit: Payer: Self-pay | Admitting: Internal Medicine

## 2020-05-09 MED ORDER — CIPROFLOXACIN HCL 500 MG PO TABS: 500.0000 mg | ORAL_TABLET | Freq: Two times a day (BID) | ORAL | 0 refills | Status: DC

## 2020-05-09 MED FILL — CIPROFLOXACIN HCL 500 MG TA: 500 | 7 days supply | Qty: 14 | Fill #0

## 2020-05-09 NOTE — Telephone Encounter (Signed)
Copied from [place] [phone]. Topic: Appointment Scheduling - Scheduling Inquiry for Clinic >> May 09, 2020  9:26 AM Scherrie Gerlach wrote: Reason for CRM: pt asked that I sent Dr Wynetta Emery a message for her. pt states she is having lower back pain, pain with urination, can't empty her bladder, pain pressure with urination.  Would like to know if she will call in abx.

## 2020-05-09 NOTE — Telephone Encounter (Signed)
Contacted pt to go over Dr. Wynetta Emery message pt is aware and doesn't have any questions

## 2020-05-09 NOTE — Telephone Encounter (Signed)
Will forward to pcp

## 2020-05-22 ENCOUNTER — Ambulatory Visit: Payer: Medicare Other | Admitting: Internal Medicine

## 2020-05-26 ENCOUNTER — Encounter: Payer: Self-pay | Admitting: Cardiology

## 2020-06-11 ENCOUNTER — Other Ambulatory Visit: Payer: Self-pay

## 2020-06-11 ENCOUNTER — Other Ambulatory Visit: Payer: Self-pay | Admitting: Physician Assistant

## 2020-06-11 ENCOUNTER — Ambulatory Visit: Payer: Medicare Other | Attending: Physician Assistant | Admitting: Physician Assistant

## 2020-06-11 DIAGNOSIS — J40 Bronchitis, not specified as acute or chronic: Secondary | ICD-10-CM

## 2020-06-11 DIAGNOSIS — F17200 Nicotine dependence, unspecified, uncomplicated: Secondary | ICD-10-CM

## 2020-06-11 MED ORDER — AZITHROMYCIN 250 MG PO TABS: ORAL_TABLET | ORAL | 0 refills | Status: DC

## 2020-06-11 MED ORDER — FLUCONAZOLE 150 MG PO TABS: 150.0000 mg | ORAL_TABLET | Freq: Once | ORAL | 0 refills | Status: DC

## 2020-06-11 MED FILL — AZITHROMYCIN 250 MG TABLET: 250 | 5 days supply | Qty: 6 | Fill #0

## 2020-06-11 MED FILL — FLUCONAZOLE 150 MG TABLET: 150 | 1 days supply | Qty: 1 | Fill #0

## 2020-06-11 NOTE — Progress Notes (Signed)
Virtual Visit via Telephone Note  I connected with Kimberly Frazier on 06/11/20 at  9:50 AM EST by telephone and verified that I am speaking with the correct person using two identifiers.  Location: Patient: Kimberly Frazier Provider: Freeman Caldron, PA-C   I discussed the limitations, risks, security and privacy concerns of performing an evaluation and management service by telephone and the availability of in person appointments. I also discussed with the patient that there may be a patient responsible charge related to this service. The patient expressed understanding and agreed to proceed.   PATIENT visit by telephone virtually in the context of Covid-19 pandemic. Patient location:  home My Location:  [place] office Persons on the call:  Screened by Boykin Reaper, me, and the patient  History of Present Illness:  Patient with 2-3 weeks h/o coughing up green and thick mucus.  No fever.  No loss of taste/smell.  No GI s/sx.  Has appt with PCP next week    Observations/Objective:  NAD.  A&Ox3  Assessment and Plan:   1. Bronchitis Will cover for atypicals and possible yeast due to diabetes - azithromycin (ZITHROMAX) 250 MG tablet; Take 2 today then 1 daily  Dispense: 6 tablet; Refill: 0 - fluconazole (DIFLUCAN) 150 MG tablet; Take 1 tablet (150 mg total) by mouth once for 1 dose.  Dispense: 1 tablet; Refill: 0  2. Smoking Smoking and dangers of nicotine have been discussed at length. Long term health consequences of smoking reviewed in detail.  Methods for helping with cessation have been reviewed.  Patient expresses understanding.     Follow Up Instructions: Keep appt next week with PCP   I discussed the assessment and treatment plan with the patient. The patient was provided an opportunity to ask questions and all were answered. The patient agreed with the plan and demonstrated an understanding of the instructions.   The patient was advised to call back or seek an in-person evaluation if  the symptoms worsen or if the condition fails to improve as anticipated.  I provided 13 minutes of non-face-to-face time during this encounter.   Freeman Caldron, PA-C  Patient ID: Kimberly Frazier, female   DOB: [birth year], 60 y.o.   MRN: [id number]

## 2020-06-11 NOTE — Progress Notes (Signed)
Pt states her phlegm is a light yellow/light brown

## 2020-06-17 ENCOUNTER — Ambulatory Visit: Payer: Medicare Other | Admitting: Internal Medicine

## 2020-06-18 ENCOUNTER — Other Ambulatory Visit: Payer: Self-pay | Admitting: Internal Medicine

## 2020-06-18 DIAGNOSIS — E876 Hypokalemia: Secondary | ICD-10-CM

## 2020-06-20 MED FILL — JARDIANCE 10 MG TABLET: 10 | 30 days supply | Qty: 30 | Fill #2

## 2020-06-20 MED FILL — AMLODIPINE BESYLATE 5 MG TA: 5 | 30 days supply | Qty: 30 | Fill #1

## 2020-06-20 MED FILL — NovoLOG 100 UNIT/ML SOLN: 100 | 23 days supply | Qty: 20 | Fill #2

## 2020-07-02 ENCOUNTER — Other Ambulatory Visit: Payer: Self-pay

## 2020-07-02 ENCOUNTER — Ambulatory Visit (INDEPENDENT_AMBULATORY_CARE_PROVIDER_SITE_OTHER): Payer: Medicare Other | Admitting: Cardiology

## 2020-07-02 ENCOUNTER — Encounter: Payer: Self-pay | Admitting: Cardiology

## 2020-07-02 VITALS — BP 183/96 | HR 86 | Ht 64.0 in | Wt 281.0 lb

## 2020-07-02 DIAGNOSIS — E669 Obesity, unspecified: Secondary | ICD-10-CM

## 2020-07-02 DIAGNOSIS — I10 Essential (primary) hypertension: Secondary | ICD-10-CM | POA: Diagnosis not present

## 2020-07-02 DIAGNOSIS — I25118 Atherosclerotic heart disease of native coronary artery with other forms of angina pectoris: Secondary | ICD-10-CM | POA: Diagnosis not present

## 2020-07-02 DIAGNOSIS — E1169 Type 2 diabetes mellitus with other specified complication: Secondary | ICD-10-CM

## 2020-07-02 DIAGNOSIS — R0602 Shortness of breath: Secondary | ICD-10-CM | POA: Diagnosis not present

## 2020-07-02 DIAGNOSIS — R55 Syncope and collapse: Secondary | ICD-10-CM | POA: Diagnosis not present

## 2020-07-02 DIAGNOSIS — E7800 Pure hypercholesterolemia, unspecified: Secondary | ICD-10-CM

## 2020-07-02 DIAGNOSIS — I3139 Other pericardial effusion (noninflammatory): Secondary | ICD-10-CM

## 2020-07-02 DIAGNOSIS — I313 Pericardial effusion (noninflammatory): Secondary | ICD-10-CM

## 2020-07-02 NOTE — Patient Instructions (Addendum)
Medication Instructions:  Keep taking the medications as you are, but add a nighttime dose of carvedilol as well. This medication works best as a twice a day medications. Take all of your medicines before your visit with Dr. Wynetta Emery, and we will see what your blood pressure is running.  *If you need a refill on your cardiac medications before your next appointment, please call your pharmacy*   Lab Work: None   Testing/Procedures: None   Follow-Up: At [facility], you and your health needs are our priority.  As part of our continuing mission to provide you with exceptional heart care, we have created designated Provider Care Teams.  These Care Teams include your primary Cardiologist (physician) and Advanced Practice Providers (APPs -  Physician Assistants and Nurse Practitioners) who all work together to provide you with the care you need, when you need it.  We recommend signing up for the patient portal called "MyChart".  Sign up information is provided on this After Visit Summary.  MyChart is used to connect with patients for Virtual Visits (Telemedicine).  Patients are able to view lab/test results, encounter notes, upcoming appointments, etc.  Non-urgent messages can be sent to your provider as well.   To learn more about what you can do with MyChart, go to [email or web address].    Your next appointment:   2 month(s)  The format for your next appointment:   In Person  Provider:   Buford Dresser, MD   .

## 2020-07-02 NOTE — Progress Notes (Signed)
Cardiology Office Note:    Date:  07/02/2020   ID:  Kimberly Frazier, DOB [birth year], MRN [id number]  PCP:  Ladell Pier, MD  Cardiologist:  Buford Dresser, MD  Referring MD: Ladell Pier, MD   CC: follow up  History of Present Illness:    Kimberly Frazier is a 60 y.o. female with a hx of CAD, hypertension, hypercholesterolemia, type II diabetes on insulin, obesity who is seen for follow up.  Today: Still having phlegm, shortness of breath. Helped slightly with antibiotics but not back to her baseline. No fevers/shaking chills.  Had an episode about 3 weeks ago when she went to the store with her daughter. Was headed back to her house in the car, sitting in the car and felt poorly. Thinks she lost consciousness briefly, woke up and had vomited on herself. No confusion. Went to her bed after and fell asleep immediately.  Has occasional shortness of breath--feels like she has to take a quick deep breath. Her biggest concern is why she doesn't have any energy and why she keeps having phlegm. Does have exposure to second hand smoke every day--brother smokes.  Blood pressure elevated today. Doesn't check at home, has a cuff but not sure where it is. Has an appt with Dr. Wynetta Emery next week. Denies any headaches or symptoms. Hasn't taken her AM medications yet.   Takes Jardiance, aspirin, potassium, carvedilol in the morning, around 9-10 AM. Waits several hours, then takes amlodipine, atorvastatin, and imdur.Has only been taking carvedilol once a day. Discussed taking this in the evening as well as it is best as a BID medication. She will try this.  Denies chest pain. No PND, orthopnea, LE edema or unexpected weight gain. No palpitations.  Past Medical History:  Diagnosis Date  . Adrenal adenoma, left   . Arthritis   . Cancer ([place])   . CKD (chronic kidney disease), stage II   . Coronary artery disease 07-28-2018 [facility]  currently due to no  insurance   per cardiac cath 02-06-2015 (positive mild lateral ishcemia on stress test)--- dLAD 80%,  mLAD 40%,  mPDA 80% (small vessel),  ostial D1 70%,  CFx with lumial irregarlities-- medical management  . Depression   . Diabetic neuropathy ([place])   . Diabetic retinopathy ([place])    NPDR OU  . Headache   . History of Bell's palsy 07/2011   per pt residual facial pain on left side occasionally  . History of cancer of vagina 1999   per pt completed radiation and chemo  . History of sepsis 12/30/2017   positive blood culter fro E.coli  . Hyperlipidemia   . Hypertension   . Hypertensive retinopathy    OU  . Hypokalemia   . Insulin dependent type 2 diabetes mellitus, uncontrolled ([place])    followed by pcp---  A1c was 11.7 on 06-15-2018 in epic  . Myocardial infarction ([place])   . Nocturia   . Peripheral neuropathy   . PMB (postmenopausal bleeding)   . Wears dentures    upper  . Wears glasses     Past Surgical History:  Procedure Laterality Date  . BREAST BIOPSY  2012   benign  . CARDIAC CATHETERIZATION N/A 02/06/2015   Procedure: Left Heart Cath and Coronary Angiography;  Surgeon: Larey Dresser, MD;  Location: [facility];  Service: Cardiovascular;  Laterality: N/A;  . CATARACT EXTRACTION Bilateral OD: 03/07/19, OS: 02/28/19   Dr. Gershon Crane  . CATARACT EXTRACTION, BILATERAL    .  EYE SURGERY Bilateral OD: 03/07/19, OS: 02/28/19   Cat Sx - Dr. Gershon Crane  . HYSTEROSCOPY WITH D & C N/A 08/01/2018   Procedure: DILATATION AND CURETTAGE /HYSTEROSCOPY;  Surgeon: Mora Bellman, MD;  Location: [facility];  Service: Gynecology;  Laterality: N/A;  . ROBOTIC ASSISTED TOTAL HYSTERECTOMY WITH BILATERAL SALPINGO OOPHERECTOMY N/A 11/28/2018   Procedure: XI ROBOTIC ASSISTED TOTAL HYSTERECTOMY WITH BILATERAL SALPINGO OOPHORECTOMY;  Surgeon: Everitt Amber, MD;  Location: [facility];  Service: Gynecology;  Laterality: N/A;  . SENTINEL NODE BIOPSY N/A 11/28/2018   Procedure: SENTINEL NODE  BIOPSY;  Surgeon: Everitt Amber, MD;  Location: [facility];  Service: Gynecology;  Laterality: N/A;  . UMBILICAL HERNIA REPAIR  child    Current Medications: Current Outpatient Medications on File Prior to Visit  Medication Sig  . Accu-Chek Softclix Lancets lancets Use to check blood sugar 3 times daily.  Marland Kitchen albuterol (VENTOLIN HFA) 108 (90 Base) MCG/ACT inhaler Inhale 2 puffs into the lungs every 6 (six) hours as needed for wheezing or shortness of breath.  Marland Kitchen amLODipine (NORVASC) 5 MG tablet TAKE 1 TABLET (5 MG TOTAL) BY MOUTH DAILY.  Marland Kitchen aspirin EC 81 MG tablet Take 1 tablet (81 mg total) daily by mouth.  Marland Kitchen atorvastatin (LIPITOR) 40 MG tablet TAKE 1 TABLET DAILY BY MOUTH.  Marland Kitchen azithromycin (ZITHROMAX) 250 MG tablet Take 2 today then 1 daily  . Blood Glucose Monitoring Suppl (ACCU-CHEK GUIDE) w/Device KIT 1 kit by Does not apply route in the morning, at noon, and at bedtime. Use to check blood sugar 3 times daily.  . Bromfenac Sodium (PROLENSA) 0.07 % SOLN Place 1 drop into both eyes 4 (four) times daily.  . butalbital-acetaminophen-caffeine (FIORICET) 50-325-40 MG tablet Take 1 tablet by mouth every 6 (six) hours as needed for headache.  . carvedilol (COREG) 25 MG tablet TAKE 1 TABLET (25 MG TOTAL) BY MOUTH 2 (TWO) TIMES DAILY WITH A MEAL.  . ciprofloxacin (CIPRO) 500 MG tablet Take 1 tablet (500 mg total) by mouth 2 (two) times daily.  . empagliflozin (JARDIANCE) 10 MG TABS tablet Take 1 tablet (10 mg total) by mouth daily.  Marland Kitchen glucose blood (ACCU-CHEK GUIDE) test strip Use to check blood sugar 3 times daily.  . insulin aspart (NOVOLOG) 100 UNIT/ML injection Inject 28 Units into the skin 3 (three) times daily before meals. (Patient taking differently: Inject 24 Units into the skin 3 (three) times daily before meals.)  . insulin glargine (LANTUS SOLOSTAR) 100 UNIT/ML Solostar Pen INJECT 58 UNITS INTO THE SKIN DAILY.  Marland Kitchen Insulin Syringe-Needle U-100 (BD INSULIN SYRINGE) 29G X 1/2" 0.3 ML MISC Use to inject  Novolog TID.  Marland Kitchen ketorolac (ACULAR) 0.5 % ophthalmic solution Place 1 drop into both eyes 4 (four) times daily.  . nitroGLYCERIN (NITROSTAT) 0.4 MG SL tablet Place 1 tab under tongue for chest pain.  May repeat after 5 minutes x 2.  DO NOT TAKE MORE THAN 3 TABS DURING AN EPISODE OF CHEST PAIN (Patient taking differently: Place 0.4 mg under the tongue every 5 (five) minutes as needed for chest pain. DO NOT TAKE MORE THAN 3 TABS DURING AN EPISODE OF CHEST PAIN)  . oxyCODONE-acetaminophen (PERCOCET/ROXICET) 5-325 MG tablet Take 1-2 tablets by mouth every 6 (six) hours as needed for severe pain.  . potassium chloride (KLOR-CON) 10 MEQ tablet TAKE 1 TABLET BY MOUTH EVERY DAY  . prednisoLONE acetate (PRED FORTE) 1 % ophthalmic suspension Place 1 drop into both eyes 4 (four) times daily.  Marland Kitchen  tiZANidine (ZANAFLEX) 4 MG tablet Take 1 tablet (4 mg total) by mouth every 6 (six) hours as needed for muscle spasms.  Marland Kitchen topiramate (TOPAMAX) 50 MG tablet Take 1 tablet (50 mg total) by mouth 2 (two) times daily.  . TRUEPLUS PEN NEEDLES 32G X 4 MM MISC USE AS DIRECTED WITH INSULIN  . isosorbide mononitrate (IMDUR) 60 MG 24 hr tablet TAKE 1 TABLET (60 MG TOTAL) BY MOUTH DAILY.   No current facility-administered medications on file prior to visit.     Allergies:   Patient has no known allergies.   Social History   Tobacco Use  . Smoking status: Never Smoker  . Smokeless tobacco: Never Used  Vaping Use  . Vaping Use: Never used  Substance Use Topics  . Alcohol use: Not Currently  . Drug use: Not Currently    Types: Marijuana    Comment: 07-28-2018  per pt last smoked 11/21/2018    Family History: family history includes Arthritis in an other family member; Cancer in her father and another family member; Diabetes in her maternal aunt, maternal uncle, mother, sister, and another family member; Heart disease in her mother, sister, and another family member; Hyperlipidemia in an other family member; Hypertension  in her mother and another family member. There is no history of Breast cancer.  ROS:   Please see the history of present illness.  Additional pertinent ROS otherwise unremarkable  EKGs/Labs/Other Studies Reviewed:    The following studies were reviewed today: Echo 04/21/20 1. Left ventricular ejection fraction, by estimation, is 50 to 55%. The  left ventricle has low normal function. The left ventricle demonstrates  regional wall motion abnormalities (see scoring diagram/findings for  description). Left ventricular diastolic  parameters are consistent with Grade I diastolic dysfunction (impaired  relaxation). Elevated left atrial pressure.  2. Right ventricular systolic function is normal. The right ventricular  size is normal.  3. Large pericardial effusion. The pericardial effusion is  circumferential. There is no evidence of cardiac tamponade. Moderate  pleural effusion in the left lateral region.  4. The mitral valve is normal in structure. Mild mitral valve  regurgitation. No evidence of mitral stenosis.  5. The aortic valve is normal in structure. Aortic valve regurgitation is  not visualized. No aortic stenosis is present.  6. Aortic dilatation noted. There is mild dilatation of the ascending  aorta, measuring 40 mm.  7. The inferior vena cava is normal in size with greater than 50%  respiratory variability, suggesting right atrial pressure of 3 mmHg.   Comparison(s): Unchanged pericardial effusion from 09/06/2018.   CT coronary 09/29/19 Coronary calcium score: The patient's coronary artery calcium score is 350, which places the patient in the 97th percentile.  Coronary arteries: Normal coronary origins.  Right dominance.  Right Coronary Artery: Dominant. Mild 25-49% non-calcified plaque of the mid-vessel (CADRADS2).  Left Main Coronary Artery: Eccentric calcification with minimal 1-24% mixed stenosis (CADRADS1). Bifurcates into the LAD and LCx vessels.  Left  Anterior Descending Coronary Artery: The LAD does not reach the apex and may be occluded in the distal portion. There is moderate 50-69% (CADRADS3) mid-vessel non-calcified stenosis.  Left Circumflex Artery: Minimal distal 1-24% diffuse non-calcified stenosis. Smaller distal OM1 branch has calcified plaque with mild to moderate stenosis.  Aorta: Normal size, 37 mm at the mid ascending aorta (level of the PA bifurcation) measured double oblique. No calcifications. No dissection.  Aortic Valve: Trileaflet.  No calcifications.  Other findings:  Normal pulmonary vein drainage into the  left atrium.  Normal left atrial appendage without a thrombus.  Dilated main pulmonary artery measuring 32 mm, suggestive of pulmonary hypertension.  Moderate sized circumferential pericardial effusion.  IMPRESSION: 1. Moderate non-calcified mid-LAD stenosis, possible distal LAD occlusion, CADRADS = 3. CT FFR will be performed and reported separately.  2. Coronary calcium score of 350. This was 97th percentile for age and sex matched control.  3. Normal coronary origin with right dominance.  4.  Moderate-sized circumferential pericardial effusion  5. Dilated main pulmonary artery to 32 mm, suggestive of pulmonary hypertension  6.  See radiology findings regarding lung parenchymal changes  EKG:  EKG is personally reviewed.  The ekg ordered 03/27/20 demonstrates sinus tachycardia at 102 bpm, PRWP  Recent Labs: 09/14/2019: ALT 13 12/21/2019: TSH 1.050 03/24/2020: B Natriuretic Peptide 64.4; BUN 15; Creatinine, Ser 1.23; Hemoglobin 12.7; Platelets 247; Potassium 3.0; Sodium 137  Recent Lipid Panel    Component Value Date/Time   CHOL 156 03/07/2020 0925   TRIG 130 03/07/2020 0925   HDL 41 03/07/2020 0925   CHOLHDL 3.8 03/07/2020 0925   CHOLHDL 5 07/15/2015 1513   VLDL 44.0 (H) 07/15/2015 1513   LDLCALC 92 03/07/2020 0925   LDLDIRECT 93.0 07/15/2015 1513    Physical Exam:     VS:  BP (!) 183/96   Pulse 86   Ht 5' 4"  (1.626 m)   Wt 281 lb (127.5 kg)   SpO2 98%   BMI 48.23 kg/m     Wt Readings from Last 3 Encounters:  07/02/20 281 lb (127.5 kg)  03/28/20 286 lb 9.6 oz (130 kg)  03/27/20 283 lb (128.4 kg)    GEN: Well nourished, well developed in no acute distress HEENT: Normal, moist mucous membranes NECK: No JVD CARDIAC: regular rhythm, normal S1 and S2, no rubs or gallops. No murmur. VASCULAR: Radial and DP pulses 2+ bilaterally. No carotid bruits RESPIRATORY:  Clear to auscultation without rales, wheezing or rhonchi. Diminished at L base. ABDOMEN: Soft, non-tender, non-distended MUSCULOSKELETAL:  Ambulates independently SKIN: Warm and dry, no edema NEUROLOGIC:  Alert and oriented x 3. No focal neuro deficits noted. PSYCHIATRIC:  Normal affect   ASSESSMENT:    1. Shortness of breath   2. Essential hypertension   3. Syncope, unspecified syncope type   4. Coronary artery disease of native artery of native heart with stable angina pectoris ([place])   5. Pericardial effusion   6. Pure hypercholesterolemia   7. Diabetes mellitus type 2 in obese ([facility])    PLAN:    Cough, phlegm, shortness of breath:  -only slight improvement with z-pak -following closely with Dr. Wynetta Emery  Hypertension: goal of <130/80 -very elevated today -has not yet taken her medications -did extensive med rec today, she has not been taking PM dose of carvedilol -continue carvedilol, she will start taking BID -continue amlodipine, imdur for BP and microvascular circulation -she has close follow up with Dr. Wynetta Emery. Instructed her to take her meds prior to visit -if BP still elevated, would increase amlodipine  Possible syncope: unclear trigger, felt poorly that day -instructed on red flag signs that need to be seen urgently -we discussed this in the setting of her effusion  CAD:  -see CT as above -continue amlodipine, imdur for BP and microvascular circulation -has  nitroglycerin PRN -continue aspirin 81 mg daily -continue carvedilol 25 mg BID -instructed on red flag warning signs that need immediate medical attention -discussed avoidance of NSAIDs, danger of MI with CAD.  Pericardial effusion: no tamponade -  ESR normal, CRP elevated -no chest pain -had possible syncopal episode but no other symptoms. Discussed red flags that need ER eval -given her very elevated BP, no clinical tamponade concerns -continue to monitor. If persists, may need to consider sampling for further evaluation (no indication for drainage from a hemodynamic standpoint)  Type II diabetes: -last A1c 9.4 -on empagliflozin 10 mg daily  Hypercholesterolemia: -Continue atorvastatin 40 mg daily -last LDL 92 02/2020. Wants to hold on increasing statin.  Cardiac risk counseling and prevention recommendations: -recommend heart healthy/Mediterranean diet, with whole grains, fruits, vegetable, fish, lean meats, nuts, and olive oil. Limit salt. -recommend moderate walking, 3-5 times/week for 30-50 minutes each session. Aim for at least 150 minutes.week. Goal should be pace of 3 miles/hours, or walking 1.5 miles in 30 minutes -recommend avoidance of tobacco products. Avoid excess alcohol. -ASCVD risk score: The 10-year ASCVD risk score Mikey Bussing DC Brooke Bonito., et al., 2013) is: 36.9%   Values used to calculate the score:     Age: 1 years     Sex: Female     Is Non-Hispanic African American: Yes     Diabetic: Yes     Tobacco smoker: No     Systolic Blood Pressure: 702 mmHg     Is BP treated: Yes     HDL Cholesterol: 41 mg/dL     Total Cholesterol: 156 mg/dL    Plan for follow up: 2 mos  Buford Dresser, MD, PhD, [facility]    Medication Adjustments/Labs and Tests Ordered: Current medicines are reviewed at length with the patient today.  Concerns regarding medicines are outlined above.  No orders of the defined types were placed in this encounter.  No  orders of the defined types were placed in this encounter.   Patient Instructions  Medication Instructions:  Keep taking the medications as you are, but add a nighttime dose of carvedilol as well. This medication works best as a twice a day medications. Take all of your medicines before your visit with Dr. Wynetta Emery, and we will see what your blood pressure is running.  *If you need a refill on your cardiac medications before your next appointment, please call your pharmacy*   Lab Work: None   Testing/Procedures: None   Follow-Up: At [facility], you and your health needs are our priority.  As part of our continuing mission to provide you with exceptional heart care, we have created designated Provider Care Teams.  These Care Teams include your primary Cardiologist (physician) and Advanced Practice Providers (APPs -  Physician Assistants and Nurse Practitioners) who all work together to provide you with the care you need, when you need it.  We recommend signing up for the patient portal called "MyChart".  Sign up information is provided on this After Visit Summary.  MyChart is used to connect with patients for Virtual Visits (Telemedicine).  Patients are able to view lab/test results, encounter notes, upcoming appointments, etc.  Non-urgent messages can be sent to your provider as well.   To learn more about what you can do with MyChart, go to [email or web address].    Your next appointment:   2 month(s)  The format for your next appointment:   In Person  Provider:   Buford Dresser, MD   .   Signed, Buford Dresser, MD PhD 07/02/2020  [place]

## 2020-07-09 ENCOUNTER — Other Ambulatory Visit: Payer: Self-pay | Admitting: Internal Medicine

## 2020-07-09 DIAGNOSIS — I10 Essential (primary) hypertension: Secondary | ICD-10-CM

## 2020-07-09 DIAGNOSIS — I2510 Atherosclerotic heart disease of native coronary artery without angina pectoris: Secondary | ICD-10-CM

## 2020-07-09 MED FILL — LANTUS SOLOSTAR 100 UNITS/M: 100 | 77 days supply | Qty: 45 | Fill #0

## 2020-07-09 MED FILL — NovoLOG 100 UNIT/ML SOLN: 100 | 23 days supply | Qty: 20 | Fill #3

## 2020-07-09 MED FILL — CARVEDILOL 25 MG TABLET: 25 | 30 days supply | Qty: 60 | Fill #0

## 2020-07-29 ENCOUNTER — Other Ambulatory Visit: Payer: Self-pay

## 2020-07-29 ENCOUNTER — Encounter: Payer: Self-pay | Admitting: Internal Medicine

## 2020-07-29 ENCOUNTER — Other Ambulatory Visit: Payer: Self-pay | Admitting: Internal Medicine

## 2020-07-29 ENCOUNTER — Ambulatory Visit: Payer: Medicare Other | Attending: Internal Medicine | Admitting: Internal Medicine

## 2020-07-29 DIAGNOSIS — Z7982 Long term (current) use of aspirin: Secondary | ICD-10-CM | POA: Diagnosis not present

## 2020-07-29 DIAGNOSIS — I2510 Atherosclerotic heart disease of native coronary artery without angina pectoris: Secondary | ICD-10-CM

## 2020-07-29 DIAGNOSIS — R053 Chronic cough: Secondary | ICD-10-CM | POA: Diagnosis not present

## 2020-07-29 DIAGNOSIS — Z1211 Encounter for screening for malignant neoplasm of colon: Secondary | ICD-10-CM

## 2020-07-29 DIAGNOSIS — E119 Type 2 diabetes mellitus without complications: Secondary | ICD-10-CM | POA: Diagnosis not present

## 2020-07-29 DIAGNOSIS — E1165 Type 2 diabetes mellitus with hyperglycemia: Secondary | ICD-10-CM | POA: Diagnosis not present

## 2020-07-29 DIAGNOSIS — I25118 Atherosclerotic heart disease of native coronary artery with other forms of angina pectoris: Secondary | ICD-10-CM | POA: Insufficient documentation

## 2020-07-29 DIAGNOSIS — Z6841 Body Mass Index (BMI) 40.0 and over, adult: Secondary | ICD-10-CM | POA: Insufficient documentation

## 2020-07-29 DIAGNOSIS — Z794 Long term (current) use of insulin: Secondary | ICD-10-CM | POA: Insufficient documentation

## 2020-07-29 DIAGNOSIS — E1169 Type 2 diabetes mellitus with other specified complication: Secondary | ICD-10-CM | POA: Diagnosis not present

## 2020-07-29 DIAGNOSIS — E785 Hyperlipidemia, unspecified: Secondary | ICD-10-CM | POA: Diagnosis not present

## 2020-07-29 DIAGNOSIS — Z8542 Personal history of malignant neoplasm of other parts of uterus: Secondary | ICD-10-CM | POA: Diagnosis not present

## 2020-07-29 DIAGNOSIS — Z79899 Other long term (current) drug therapy: Secondary | ICD-10-CM | POA: Insufficient documentation

## 2020-07-29 DIAGNOSIS — I10 Essential (primary) hypertension: Secondary | ICD-10-CM | POA: Diagnosis not present

## 2020-07-29 DIAGNOSIS — Z1231 Encounter for screening mammogram for malignant neoplasm of breast: Secondary | ICD-10-CM

## 2020-07-29 DIAGNOSIS — I119 Hypertensive heart disease without heart failure: Secondary | ICD-10-CM | POA: Diagnosis not present

## 2020-07-29 LAB — POCT GLYCOSYLATED HEMOGLOBIN (HGB A1C): HbA1c, POC (controlled diabetic range): 7.4 % — AB (ref 0.0–7.0)

## 2020-07-29 LAB — GLUCOSE, POCT (MANUAL RESULT ENTRY): POC Glucose: 140 mg/dl — AB (ref 70–99)

## 2020-07-29 MED ORDER — AMLODIPINE BESYLATE 10 MG PO TABS: 10.0000 mg | ORAL_TABLET | Freq: Every day | ORAL | 1 refills | Status: DC

## 2020-07-29 MED FILL — AMLODIPINE BESYLATE 10 MG T: 10 | 90 days supply | Qty: 90 | Fill #0

## 2020-07-29 MED FILL — JARDIANCE 10 MG TABLET: 10 | 30 days supply | Qty: 30 | Fill #3

## 2020-07-29 MED FILL — ATORVASTATIN CALCIUM 40 MG: 40 | 30 days supply | Qty: 30 | Fill #2

## 2020-07-29 NOTE — Progress Notes (Signed)
Patient ID: Kimberly Frazier, female    DOB: Nov 17, 1960  MRN: [id number]  CC: Hypertension and Diabetes   Subjective: Kimberly Frazier is a 60 y.o. female who presents for chronic ds management Her concerns today include:  Patient with history of DM with neuropathyand retinopathy, HTN with hypertensive heart disease, CAD(Coronary CT revealed occlusion in the mid to distal LAD, distal RCA.Cardiology states that she will need CABG eventually),HL,obesity,endometrial CA s/p hysterectomy.  DM:  A1C 7.4 which is improved  -was not consistent with taking her meds several wks ago when she was sick with pneumonia/bronchitis.  Reports she is  getting on track Reports taking Lantus 54-58 units daily but most of the times it is 54 units, Novolog 28 units 1-2 times a day with meals instead of TID with meals, consistent with Jardiance daily Gained 5 lbs in past 1 mth.  Admits to eating over eating things she should not like rice, pasta, bread. Drinks regular Coke, sweet tea and water.  -checks BS every morning. Range 120-170  HTN/CAD: Taking Potassium 10 meq 2 daily, Coreg BID, Norvasc, Imdur Limiting salt in foods No CP.  SOB if she over exerts self doing house work  Had pneumonia in 02/2020 with abn CXR and CT. Treated with abx.  Thinks she developed pneumonia again in mid January.  She was evaluated by our PA and was treated with antibiotics.  Less phlegm and cough but not totally back to base line. Cough is dry.  Lacks energy.  Winded when doing house work.  No LE edema.  Uterine CA:  Over due for f/u with Dr. Denman George whom she last saw 08/2019.  Plan was for f/u in 3 mths.  She is deemed to be at moderate to high risk for recurrence.  Patient states she had called and canceled her appointment and never rescheduled.  She is agreeable to rescheduling the appointment.  She was also referred for mammogram back in October which she states she never did get around to scheduling. Patient Active Problem List    Diagnosis Date Noted  . New onset headache 12/21/2019  . Cerebral vascular disease 12/21/2019  . Migraine without aura and without status migrainosus, not intractable 11/12/2019  . Endometrial cancer ([place]) 11/28/2018  . Hematuria 11/28/2018  . 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 History of radiation therapy 11/28/2018  . Diabetes mellitus ([place]) 10/06/2018  . Type 2 diabetes mellitus with hyperglycemia, with long-term current use of insulin ([place]) 10/06/2018  . Adrenal adenoma, left 06/15/2018  . 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 Postmenopausal vaginal bleeding 06/15/2018  . Hyperkalemia   . Diabetic peripheral neuropathy ([place]) 09/03/2016  . CAD (coronary artery disease) 04/10/2015  . Severe obesity (BMI >= 40) ([place]) 09/20/2013  . 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 Hyperlipidemia 10/25/2012  . Diabetes mellitus type 2, uncontrolled, with complications ([place]) 37/16/9678  . Essential hypertension 07/26/2012     Current Outpatient Medications on File Prior to Visit  Medication Sig Dispense Refill  . Accu-Chek Softclix Lancets lancets Use to check blood sugar 3 times daily. 100 each 6  . albuterol (VENTOLIN HFA) 108 (90 Base) MCG/ACT inhaler Inhale 2 puffs into the lungs every 6 (six) hours as needed for wheezing or shortness of breath. 18 g 0  . aspirin EC 81 MG tablet Take 1 tablet (81 mg total) daily by mouth. 100 tablet 1  . atorvastatin (LIPITOR) 40 MG tablet TAKE 1 TABLET DAILY BY MOUTH. 30 tablet 2  . Blood Glucose Monitoring  Suppl (ACCU-CHEK GUIDE) w/Device KIT 1 kit by Does not apply route in the morning, at noon, and at bedtime. Use to check blood sugar 3 times daily. 1 kit 0  . Bromfenac Sodium (PROLENSA) 0.07 % SOLN Place 1 drop into both eyes 4 (four) times daily. 6 mL 0  . butalbital-acetaminophen-caffeine (FIORICET) 50-325-40 MG tablet Take 1 tablet by mouth every 6 (six) hours as needed for headache. 12 tablet 3  . carvedilol (COREG) 25 MG tablet TAKE 1 TABLET  (25 MG TOTAL) BY MOUTH 2 (TWO) TIMES DAILY WITH A MEAL. 60 tablet 0  . ciprofloxacin (CIPRO) 500 MG tablet Take 1 tablet (500 mg total) by mouth 2 (two) times daily. 14 tablet 0  . empagliflozin (JARDIANCE) 10 MG TABS tablet Take 1 tablet (10 mg total) by mouth daily. 30 tablet 4  . glucose blood (ACCU-CHEK GUIDE) test strip Use to check blood sugar 3 times daily. 100 each 6  . insulin aspart (NOVOLOG) 100 UNIT/ML injection Inject 28 Units into the skin 3 (three) times daily before meals. (Patient taking differently: Inject 24 Units into the skin 3 (three) times daily before meals.) 20 mL 11  . insulin glargine (LANTUS SOLOSTAR) 100 UNIT/ML Solostar Pen INJECT 58 UNITS INTO THE SKIN DAILY. 45 mL 6  . Insulin Syringe-Needle U-100 (BD INSULIN SYRINGE) 29G X 1/2" 0.3 ML MISC Use to inject Novolog TID. 100 each 2  . isosorbide mononitrate (IMDUR) 60 MG 24 hr tablet TAKE 1 TABLET (60 MG TOTAL) BY MOUTH DAILY. 90 tablet 3  . ketorolac (ACULAR) 0.5 % ophthalmic solution Place 1 drop into both eyes 4 (four) times daily. 10 mL 4  . nitroGLYCERIN (NITROSTAT) 0.4 MG SL tablet Place 1 tab under tongue for chest pain.  May repeat after 5 minutes x 2.  DO NOT TAKE MORE THAN 3 TABS DURING AN EPISODE OF CHEST PAIN (Patient taking differently: Place 0.4 mg under the tongue every 5 (five) minutes as needed for chest pain. DO NOT TAKE MORE THAN 3 TABS DURING AN EPISODE OF CHEST PAIN) 25 tablet 6  . potassium chloride (KLOR-CON) 10 MEQ tablet TAKE 1 TABLET BY MOUTH EVERY DAY 90 tablet 2  . prednisoLONE acetate (PRED FORTE) 1 % ophthalmic suspension Place 1 drop into both eyes 4 (four) times daily. 10 mL 4  . tiZANidine (ZANAFLEX) 4 MG tablet Take 1 tablet (4 mg total) by mouth every 6 (six) hours as needed for muscle spasms. 30 tablet 6  . topiramate (TOPAMAX) 50 MG tablet Take 1 tablet (50 mg total) by mouth 2 (two) times daily. 60 tablet 6  . TRUEPLUS PEN NEEDLES 32G X 4 MM MISC USE AS DIRECTED WITH INSULIN 100 each 0    No current facility-administered medications on file prior to visit.    No Known Allergies  Social History   Socioeconomic History  . Marital status: Single    Spouse name: Not on file  . Number of children: 2  . Years of education: 57  . Highest education level: Not on file  Occupational History  . Occupation: retired  Tobacco Use  . Smoking status: Never Smoker  . Smokeless tobacco: Never Used  Vaping Use  . Vaping Use: Never used  Substance and Sexual Activity  . Alcohol use: Not Currently  . Drug use: Not Currently    Types: Marijuana    Comment: 07-28-2018  per pt last smoked 11/21/2018  . Sexual activity: Yes  Other Topics Concern  . Not on  file  Social History Narrative   Lives at home with her grandson.   Right-handed.   No daily caffeine use.   Social Determinants of Health   Financial Resource Strain: Not on file  Food Insecurity: Not on file  Transportation Needs: Not on file  Physical Activity: Not on file  Stress: Not on file  Social Connections: Not on file  Intimate Partner Violence: Not on file    Family History  Problem Relation Age of Onset  . Heart disease Mother   . Hypertension Mother   . Diabetes Mother   . Cancer Father        hodgkins lymphoma  . Heart disease Sister        heart attack  . Diabetes Sister   . Cancer Other        parent  . Diabetes Other        parent  . Heart disease Other        parent  . Hyperlipidemia Other        parent  . Hypertension Other        parent  . Arthritis Other        parent  . Diabetes Maternal Aunt   . Diabetes Maternal Uncle   . Breast cancer Neg Hx     Past Surgical History:  Procedure Laterality Date  . BREAST BIOPSY  2012   benign  . CARDIAC CATHETERIZATION N/A 02/06/2015   Procedure: Left Heart Cath and Coronary Angiography;  Surgeon: Larey Dresser, MD;  Location: [facility];  Service: Cardiovascular;  Laterality: N/A;  . CATARACT EXTRACTION Bilateral OD: 03/07/19,  OS: 02/28/19   Dr. Gershon Crane  . CATARACT EXTRACTION, BILATERAL    . EYE SURGERY Bilateral OD: 03/07/19, OS: 02/28/19   Cat Sx - Dr. Gershon Crane  . HYSTEROSCOPY WITH D & C N/A 08/01/2018   Procedure: DILATATION AND CURETTAGE /HYSTEROSCOPY;  Surgeon: Mora Bellman, MD;  Location: [facility];  Service: Gynecology;  Laterality: N/A;  . ROBOTIC ASSISTED TOTAL HYSTERECTOMY WITH BILATERAL SALPINGO OOPHERECTOMY N/A 11/28/2018   Procedure: XI ROBOTIC ASSISTED TOTAL HYSTERECTOMY WITH BILATERAL SALPINGO OOPHORECTOMY;  Surgeon: Everitt Amber, MD;  Location: [facility];  Service: Gynecology;  Laterality: N/A;  . SENTINEL NODE BIOPSY N/A 11/28/2018   Procedure: SENTINEL NODE BIOPSY;  Surgeon: Everitt Amber, MD;  Location: [facility];  Service: Gynecology;  Laterality: N/A;  . UMBILICAL HERNIA REPAIR  child    ROS: Review of Systems Negative except as stated above  PHYSICAL EXAM: BP (!) 142/90   Pulse 75   Resp 16   Wt 286 lb (129.7 kg)   SpO2 95%   BMI 49.09 kg/m   Wt Readings from Last 3 Encounters:  07/29/20 286 lb (129.7 kg)  07/02/20 281 lb (127.5 kg)  03/28/20 286 lb 9.6 oz (130 kg)    Physical Exam  General appearance - alert, well appearing, morbidly obese African-American female and in no distress Mental status - normal mood, behavior, speech, dress, motor activity, and thought processes Nose - normal and patent, no erythema, discharge or polyps Mouth - mucous membranes moist, pharynx normal without lesions Neck - supple, no significant adenopathy Chest - clear to auscultation, no wheezes, rales or rhonchi, symmetric air entry Heart - normal rate, regular rhythm, normal S1, S2, no murmurs, rubs, clicks or gallops Extremities - peripheral pulses normal, no pedal edema, no clubbing or cyanosis   CMP Latest Ref Rng & Units 03/24/2020 01/15/2020 09/14/2019  Glucose  70 - 99 mg/dL 348(H) 201(H) 172(H)  BUN 8 - 23 mg/dL _0 Creatinine 0.44 - 1.00 mg/dL 1.23(H) 1.13(H) 0.99  Sodium 135  - 145 mmol/L 137 142 143  Potassium 3.5 - 5.1 mmol/L 3.0(L) 3.5 3.8  Chloride 98 - 111 mmol/L 100 102 104  CO2 22 - 32 mmol/L _1 Calcium 8.9 - 10.3 mg/dL 8.8(L) 9.3 9.2  Total Protein 6.0 - 8.5 g/dL - - 7.0  Total Bilirubin 0.0 - 1.2 mg/dL - - 0.5  Alkaline Phos 39 - 117 IU/L - - 126(H)  AST 0 - 40 IU/L - - 17  ALT 0 - 32 IU/L - - 13   Lipid Panel     Component Value Date/Time   CHOL 156 03/07/2020 0925   TRIG 130 03/07/2020 0925   HDL 41 03/07/2020 0925   CHOLHDL 3.8 03/07/2020 0925   CHOLHDL 5 07/15/2015 1513   VLDL 44.0 (H) 07/15/2015 1513   LDLCALC 92 03/07/2020 0925   LDLDIRECT 93.0 07/15/2015 1513    CBC    Component Value Date/Time   WBC 10.9 (H) 03/24/2020 1427   RBC 4.50 03/24/2020 1427   HGB 12.7 03/24/2020 1427   HGB 13.4 06/15/2018 1056   HCT 39.4 03/24/2020 1427   HCT 40.3 06/15/2018 1056   PLT 247 03/24/2020 1427   PLT 229 06/15/2018 1056   MCV 87.6 03/24/2020 1427   MCV 88 06/15/2018 1056   MCH 28.2 03/24/2020 1427   MCHC 32.2 03/24/2020 1427   RDW 14.2 03/24/2020 1427   RDW 13.6 06/15/2018 1056   LYMPHSABS 1.0 12/30/2017 0812   MONOABS 1.3 (H) 12/30/2017 0812   EOSABS 0.1 12/30/2017 0812   BASOSABS 0.0 12/30/2017 0812    ASSESSMENT AND PLAN:  1. Type 2 diabetes mellitus with morbid obesity ([place]) Close to goal. Strongly encourage compliance with her medications and insulin.  Recommend increasing the Lantus insulin to 58 units. I spent some time with nutrition counseling.  Encouraged her to eliminate the sugary drinks from her diet and to cut back on portion sizes of white carbohydrates. - POCT glucose (manual entry) - POCT glycosylated hemoglobin (Hb A1C) - Microalbumin / creatinine urine ratio - Ambulatory referral to Ophthalmology  2. Essential hypertension Not at goal.  Increase amlodipine to 10 mg daily. Strongly encourage compliance with medications. - amLODipine (NORVASC) 10 MG tablet; Take 1 tablet (10 mg total) by mouth daily.   Dispense: 90 tablet; Refill: 1  3. Coronary artery disease of native artery of native heart with stable angina pectoris (HCC) Continue current medications including isosorbide, aspirin, carvedilol, atorvastatin  4. Chronic cough - DG Chest 2 View; Future  5. Screening for colon cancer Discussed screening methods for colon cancer on recommendation for screening.  Patient prefers to have colonoscopy. - Ambulatory referral to Gastroenterology  6. History of endometrial cancer Stressed the importance of getting back to her gynecology oncologist Dr. Denman George.  She is agreeable to have appointment made - Ambulatory referral to Gynecology  7. Encounter for screening mammogram for malignant neoplasm of breast Discussed and encouraged the importance of breast cancer screening - MM Digital Screening; Future   Patient was given the opportunity to ask questions.  Patient verbalized understanding of the plan and was able to repeat key elements of the plan.   Orders Placed This Encounter  Procedures  . DG Chest 2 View  . Microalbumin / creatinine urine ratio  . Ambulatory referral to Ophthalmology  . Ambulatory  referral to Gastroenterology  . POCT glucose (manual entry)  . POCT glycosylated hemoglobin (Hb A1C)     Requested Prescriptions   Signed Prescriptions Disp Refills  . amLODipine (NORVASC) 10 MG tablet 90 tablet 1    Sig: Take 1 tablet (10 mg total) by mouth daily.    Return in about 5 weeks (around 09/02/2020) for Medicare wellness.  Karle Plumber, MD, FACP

## 2020-07-29 NOTE — Patient Instructions (Signed)
Your blood pressure is not at goal. We have increased the amlodipine to 10 mg daily.  Your A1c for the diabetes has improved but not at goal. I recommend increasing the Lantus insulin to 58 units daily. Try to take the NovoLog with meals as prescribed.  You can go to the radiology department at [facility] at any time to have the chest x-ray done.

## 2020-07-30 DIAGNOSIS — Z8542 Personal history of malignant neoplasm of other parts of uterus: Secondary | ICD-10-CM | POA: Insufficient documentation

## 2020-07-31 ENCOUNTER — Telehealth: Payer: Self-pay

## 2020-07-31 NOTE — Telephone Encounter (Signed)
Pt was called and a VM was left informing pt to return phone call to set up medicare wellness exam.

## 2020-07-31 NOTE — Telephone Encounter (Signed)
-----   Message from [place] sent at 07/31/2020  1:39 PM EST ----- Please see below message and schedule appt . Thank you  ----- Message ----- From: Ladell Pier, MD Sent: 07/30/2020  11:17 AM EST To: [facility] Admin Pool Subject: Please schedule for Medicare Wellness in ear#

## 2020-08-02 LAB — MICROALBUMIN / CREATININE URINE RATIO
Creatinine, Urine: 69.5 mg/dL
Microalb/Creat Ratio: 47 mg/g creat — ABNORMAL HIGH (ref 0–29)
Microalbumin, Urine: 32.8 ug/mL

## 2020-08-05 MED FILL — ISOSORBIDE MN ER 60 MG TAB: 60 | 90 days supply | Qty: 90 | Fill #1

## 2020-08-07 MED FILL — AMLODIPINE BESYLATE 10 MG T: 10 | 90 days supply | Qty: 90 | Fill #0

## 2020-08-11 ENCOUNTER — Ambulatory Visit (HOSPITAL_COMMUNITY)
Admission: RE | Admit: 2020-08-11 | Discharge: 2020-08-11 | Disposition: A | Discharge status: Home / Self Care | Payer: Medicare Other | Source: Ambulatory Visit | Attending: Internal Medicine | Admitting: Internal Medicine

## 2020-08-11 ENCOUNTER — Other Ambulatory Visit: Payer: Self-pay

## 2020-08-11 DIAGNOSIS — R053 Chronic cough: Secondary | ICD-10-CM | POA: Insufficient documentation

## 2020-08-14 ENCOUNTER — Telehealth: Payer: Self-pay | Admitting: Internal Medicine

## 2020-08-14 NOTE — Telephone Encounter (Signed)
Copied from [place] [id number]. Topic: General - Other >> Aug 13, 2020  3:19 PM Alanda Slim E wrote: Reason for CRM: Pt would someone to call her to go over lab results and imaging result/ please advise

## 2020-08-14 NOTE — Telephone Encounter (Signed)
Returned pt call and went over Xray results. Pt asked about her urine results and I went to Dr. Margarita Rana to double check urine results before I made pt aware. There is protein in the indication that dm is affecting the kidneys so good blood sugar control, Diet and drinking plenty of water. Pt states she understands and doesn't have any questions or concerns

## 2020-08-29 ENCOUNTER — Ambulatory Visit: Payer: Medicare Other | Admitting: Cardiology

## 2020-08-30 ENCOUNTER — Other Ambulatory Visit: Payer: Self-pay

## 2020-09-03 ENCOUNTER — Telehealth: Payer: Self-pay | Admitting: *Deleted

## 2020-09-03 ENCOUNTER — Other Ambulatory Visit: Payer: Self-pay

## 2020-09-03 ENCOUNTER — Ambulatory Visit (INDEPENDENT_AMBULATORY_CARE_PROVIDER_SITE_OTHER): Payer: Medicare Other | Admitting: Family Medicine

## 2020-09-03 VITALS — Wt 282.4 lb

## 2020-09-03 DIAGNOSIS — Z8542 Personal history of malignant neoplasm of other parts of uterus: Secondary | ICD-10-CM

## 2020-09-03 NOTE — Telephone Encounter (Signed)
Called and scheduled the patient for a follow up appt  

## 2020-09-03 NOTE — Progress Notes (Signed)
CMA spoke to patient. Patient stated she does not know what the appointment today is for, per patient she thought it was a pap smear but she had a hysterectomy, and would like to follow up back with Dr. Denman George, her GYN/ ONC. Patient stated she will give Dr. Denman George office a call to follow up with her.   Francisco Capuchin, [place]. 09/03/2020

## 2020-09-03 NOTE — Progress Notes (Signed)
Pt. Needs f/u with GYN/ONC-->referral placed. Not seen or roomed today

## 2020-09-10 ENCOUNTER — Inpatient Hospital Stay: Payer: Medicare Other | Attending: Gynecologic Oncology | Admitting: Gynecologic Oncology

## 2020-09-10 ENCOUNTER — Other Ambulatory Visit: Payer: Self-pay

## 2020-09-10 VITALS — BP 147/87 | HR 91 | Temp 97.1°F | Resp 20 | Wt 285.5 lb

## 2020-09-10 DIAGNOSIS — Z90722 Acquired absence of ovaries, bilateral: Secondary | ICD-10-CM | POA: Insufficient documentation

## 2020-09-10 DIAGNOSIS — C541 Malignant neoplasm of endometrium: Secondary | ICD-10-CM | POA: Diagnosis present

## 2020-09-10 DIAGNOSIS — I2510 Atherosclerotic heart disease of native coronary artery without angina pectoris: Secondary | ICD-10-CM | POA: Insufficient documentation

## 2020-09-10 DIAGNOSIS — Z800 Family history of malignant neoplasm of digestive organs: Secondary | ICD-10-CM | POA: Insufficient documentation

## 2020-09-10 DIAGNOSIS — I252 Old myocardial infarction: Secondary | ICD-10-CM | POA: Insufficient documentation

## 2020-09-10 DIAGNOSIS — I129 Hypertensive chronic kidney disease with stage 1 through stage 4 chronic kidney disease, or unspecified chronic kidney disease: Secondary | ICD-10-CM | POA: Diagnosis not present

## 2020-09-10 DIAGNOSIS — N182 Chronic kidney disease, stage 2 (mild): Secondary | ICD-10-CM | POA: Diagnosis not present

## 2020-09-10 DIAGNOSIS — E7800 Pure hypercholesterolemia, unspecified: Secondary | ICD-10-CM | POA: Insufficient documentation

## 2020-09-10 DIAGNOSIS — Z923 Personal history of irradiation: Secondary | ICD-10-CM | POA: Diagnosis not present

## 2020-09-10 DIAGNOSIS — Z8572 Personal history of non-Hodgkin lymphomas: Secondary | ICD-10-CM | POA: Insufficient documentation

## 2020-09-10 DIAGNOSIS — Z90710 Acquired absence of both cervix and uterus: Secondary | ICD-10-CM | POA: Diagnosis not present

## 2020-09-10 DIAGNOSIS — Z6841 Body Mass Index (BMI) 40.0 and over, adult: Secondary | ICD-10-CM | POA: Diagnosis not present

## 2020-09-10 DIAGNOSIS — E1142 Type 2 diabetes mellitus with diabetic polyneuropathy: Secondary | ICD-10-CM | POA: Insufficient documentation

## 2020-09-10 DIAGNOSIS — E1122 Type 2 diabetes mellitus with diabetic chronic kidney disease: Secondary | ICD-10-CM | POA: Diagnosis not present

## 2020-09-10 DIAGNOSIS — E785 Hyperlipidemia, unspecified: Secondary | ICD-10-CM | POA: Insufficient documentation

## 2020-09-10 DIAGNOSIS — Z79899 Other long term (current) drug therapy: Secondary | ICD-10-CM | POA: Insufficient documentation

## 2020-09-10 DIAGNOSIS — Z9221 Personal history of antineoplastic chemotherapy: Secondary | ICD-10-CM | POA: Insufficient documentation

## 2020-09-10 NOTE — Progress Notes (Signed)
Follow-up Note: Gyn-Onc  Consult was initially requested by Dr. Elly Modena for the evaluation of Kimberly Frazier 60 y.o. female  CC:  Chief Complaint  Patient presents with  . endometrial cancer    Assessment/Plan:  [name]  is a 60 y.o.  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 High/intermediate risk factors for recurrence.  Adjuvant radiation was not possible due to prior radiation and adjuvant chemotherapy was declined due to medical comorbidities.   Urinary incontinence - recommend continued follow-up with Dr Matilde Sprang.   I will see her back for follow-up in 3 months.   HPI: [name] is a 60 year old P2 who is seen in consultation at the request of Dr Elly Modena for grade 3 endometrial cancer.  The patient reports a history of light vaginal spotting for approximately 1 year.  Since January 2020 she developed heavy vaginal bleeding and this prompted her to see Dr. Elly Modena who performed a transvaginal ultrasound on June 20, 2018.  This revealed a uterus measuring 7.6 x 3.1 x 4.9 cm, there was a hypoechoic masslike area in the cervix measuring 1.6 x 1.4 x 1.9 cm.  The endometrium was thickened at 14 mm.  It contained an endometrial mass measuring 2.6 cm in greatest dimension.  She was taken to the operating room on August 01, 2018 with Dr. Elly Modena.  She performed the hysteroscopy D&C.  Intraoperative findings were significant for an 8-week size uterus, diffuse proliferative endometrium, but no mention of an endometrial mass.  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  Of note the patient has a history of a Pap smear in January 2020 that  was normal with no high risk HPV detected.  The patient's medical history is complex.  She has a remote history of a gynecologic malignancy in 1999.  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  She describes treatment with primary brachii therapy (she describes being in bed in the hospital for 5 days with radiation internally).  She is unsure if she received chemotherapy.  She did not receive surgery for this.  She received follow-up for approximately a year after treatment and then discontinued.  Her medical history is also significant for coronary artery disease.  The patient had what she describes as a light myocardial infarction in 2016.  This was managed with a coronary angiography, however no stent was placed.  She did not receive ongoing cardiology evaluation as the patient reports that she did not have health insurance.  She was initially treated with medical therapy.  Her only medical therapy at the time of this diagnosis with aspirin 81 mg daily.  Patient has a history of type 2 diabetes mellitus for which she takes insulin.  She reports poor blood glucose control.  She reports not particularly being compliant with her diet.  She has sequelae of diabetic neuropathy in hands and feet and chronic kidney disease.  She has hypertension and hypercholesterolemia.  The patient is morbidly obese with a BMI of 48 kg/m.  Her prior abdominal surgery includes 1 prior cesarean section and, as a child, a midline laparotomy for a hernia repair.  Her family history is significant for father who had a diagnosis  of chins lymphoma, and a brother had a history of colon cancer.  Her CA 125 was elevated to 12% in March, 2020.  She was seen by cardiology who felt that she was moderate/high risk for perioperative cardiac events but that she was maximally optimized for a hysterectomy and did not advocate for CABG beforehand.  CT abd/pelvis on 08/17/18 showed no apparent extrauterine disease. Small  ground glass opacities were seen in the lower lungs -recommended 6 month follow-up.  She has been optimizing her blood glucose control.   The patient underwent robotic assisted total hysterectomy, BSO, SLN biopsy on 11/28/18. Final pathology revealed a high grade serous carcinoma of the endometrium with inner half (5 of 52m) invasion and no LVSI or cervical involvment. SLN's were negative as were the adnexa. She was assigned a stage IA grade 3 serous carcinoma. Her 2 equivocal (2+), and MSI stable, MMR normal.   Due to the high grade nature of her pathology she met criteria for adjuvant therapy with either chemotherapy, vaginal brachytherapy or both. However, she is not a candidate for additional radiation due to her prior radiation for cervical cancer, and has medical comorbidities which would make delivery of chemotherapy challenging.   Postoperatively she did well. Though she developed stress urinary incontinence that was significant.   She was offered adjuvant chemotherapy but declined due to medical comorbidities and anticipated morbidity.  She was referred to Dr MMatilde Sprangfor urinary incontinence.   Interval Hx:  She has no concerning symptoms for recurrence.   Current Meds:  Outpatient Encounter Medications as of 09/10/2020  Medication Sig  . Accu-Chek Softclix Lancets lancets Use to check blood sugar 3 times daily.  .Marland Kitchenalbuterol (VENTOLIN HFA) 108 (90 Base) MCG/ACT inhaler Inhale 2 puffs into the lungs every 6 (six) hours as needed for wheezing or shortness of breath.  .Marland KitchenamLODipine (NORVASC) 10 MG tablet TAKE 1 TABLET (10 MG TOTAL) BY MOUTH DAILY.  .Marland Kitchenaspirin EC 81 MG tablet Take 1 tablet (81 mg total) daily by mouth.  .Marland Kitchenatorvastatin (LIPITOR) 40 MG tablet TAKE 1 TABLET DAILY BY MOUTH.  . Blood Glucose Monitoring Suppl (ACCU-CHEK GUIDE) w/Device KIT 1 kit by Does not apply route in the morning, at noon, and at bedtime. Use to check blood sugar 3 times daily.  . carvedilol (COREG) 25  MG tablet TAKE 1 TABLET (25 MG TOTAL) BY MOUTH 2 (TWO) TIMES DAILY WITH A MEAL.  .Marland Kitchenempagliflozin (JARDIANCE) 10 MG TABS tablet TAKE 1 TABLET (10 MG TOTAL) BY MOUTH DAILY.  .Marland Kitchenglucose blood (ACCU-CHEK GUIDE) test strip Use to check blood sugar 3 times daily.  . insulin aspart (NOVOLOG) 100 UNIT/ML injection INJECT 28 UNITS INTO THE SKIN 3 (THREE) TIMES DAILY BEFORE MEALS. (Patient taking differently: Inject 24 Units into the skin 3 (three) times daily before meals.)  . insulin glargine (LANTUS) 100 UNIT/ML Solostar Pen INJECT 58 UNITS INTO THE SKIN DAILY.  .Marland KitchenInsulin Syringe-Needle U-100 (BD INSULIN SYRINGE) 29G X 1/2" 0.3 ML MISC Use to inject Novolog TID.  .Marland KitchenInsulin Syringe-Needle U-100 31G X 15/64" 0.3 ML MISC USE TO INJECT NOVOLOG THREE TIMES DAILY  . isosorbide mononitrate (IMDUR) 60 MG 24 hr tablet TAKE 1 TABLET (60 MG TOTAL) BY MOUTH DAILY.  .Marland Kitchenketorolac (ACULAR) 0.5 % ophthalmic solution Place 1 drop into both eyes 4 (four) times daily.  . nitroGLYCERIN (NITROSTAT) 0.4 MG SL tablet Place 1 tab under tongue for chest pain.  May repeat after 5 minutes x 2.  DO  NOT TAKE MORE THAN 3 TABS DURING AN EPISODE OF CHEST PAIN (Patient taking differently: Place 0.4 mg under the tongue every 5 (five) minutes as needed for chest pain. DO NOT TAKE MORE THAN 3 TABS DURING AN EPISODE OF CHEST PAIN)  . potassium chloride (KLOR-CON) 10 MEQ tablet TAKE 1 TABLET BY MOUTH EVERY DAY  . prednisoLONE acetate (PRED FORTE) 1 % ophthalmic suspension Place 1 drop into both eyes 4 (four) times daily.  . TRUEPLUS PEN NEEDLES 32G X 4 MM MISC USE AS DIRECTED WITH INSULIN  . Bromfenac Sodium (PROLENSA) 0.07 % SOLN Place 1 drop into both eyes 4 (four) times daily. (Patient not taking: Reported on 09/10/2020)  . butalbital-acetaminophen-caffeine (FIORICET) 50-325-40 MG tablet Take 1 tablet by mouth every 6 (six) hours as needed for headache. (Patient not taking: Reported on 09/10/2020)  . ciprofloxacin (CIPRO) 500 MG tablet TAKE 1  TABLET (500 MG TOTAL) BY MOUTH 2 (TWO) TIMES DAILY. (Patient not taking: Reported on 09/10/2020)  . tiZANidine (ZANAFLEX) 4 MG tablet Take 1 tablet (4 mg total) by mouth every 6 (six) hours as needed for muscle spasms. (Patient not taking: Reported on 09/10/2020)  . topiramate (TOPAMAX) 50 MG tablet Take 1 tablet (50 mg total) by mouth 2 (two) times daily. (Patient not taking: Reported on 09/10/2020)   No facility-administered encounter medications on file as of 09/10/2020.    Allergy: No Known Allergies  Social Hx:   Social History   Socioeconomic History  . Marital status: Single    Spouse name: Not on file  . Number of children: 2  . Years of education: 51  . Highest education level: Not on file  Occupational History  . Occupation: retired  Tobacco Use  . Smoking status: Never Smoker  . Smokeless tobacco: Never Used  Vaping Use  . Vaping Use: Never used  Substance and Sexual Activity  . Alcohol use: Not Currently  . Drug use: Not Currently    Types: Marijuana    Comment: 07-28-2018  per pt last smoked 11/21/2018  . Sexual activity: Yes  Other Topics Concern  . Not on file  Social History Narrative   Lives at home with her grandson.   Right-handed.   No daily caffeine use.   Social Determinants of Health   Financial Resource Strain: Not on file  Food Insecurity: Not on file  Transportation Needs: Not on file  Physical Activity: Not on file  Stress: Not on file  Social Connections: Not on file  Intimate Partner Violence: Not on file    Past Surgical Hx:  Past Surgical History:  Procedure Laterality Date  . BREAST BIOPSY  2012   benign  . CARDIAC CATHETERIZATION N/A 02/06/2015   Procedure: Left Heart Cath and Coronary Angiography;  Surgeon: Larey Dresser, MD;  Location: [facility];  Service: Cardiovascular;  Laterality: N/A;  . CATARACT EXTRACTION Bilateral OD: 03/07/19, OS: 02/28/19   Dr. Gershon Crane  . CATARACT EXTRACTION, BILATERAL    . EYE SURGERY Bilateral  OD: 03/07/19, OS: 02/28/19   Cat Sx - Dr. Gershon Crane  . HYSTEROSCOPY WITH D & C N/A 08/01/2018   Procedure: DILATATION AND CURETTAGE /HYSTEROSCOPY;  Surgeon: Mora Bellman, MD;  Location: [facility];  Service: Gynecology;  Laterality: N/A;  . ROBOTIC ASSISTED TOTAL HYSTERECTOMY WITH BILATERAL SALPINGO OOPHERECTOMY N/A 11/28/2018   Procedure: XI ROBOTIC ASSISTED TOTAL HYSTERECTOMY WITH BILATERAL SALPINGO OOPHORECTOMY;  Surgeon: Everitt Amber, MD;  Location: [facility];  Service: Gynecology;  Laterality: N/A;  .  SENTINEL NODE BIOPSY N/A 11/28/2018   Procedure: SENTINEL NODE BIOPSY;  Surgeon: Everitt Amber, MD;  Location: [facility];  Service: Gynecology;  Laterality: N/A;  . UMBILICAL HERNIA REPAIR  child    Past Medical Hx:  Past Medical History:  Diagnosis Date  . Adrenal adenoma, left   . Arthritis   . Cancer ([place])   . CKD (chronic kidney disease), stage II   . Coronary artery disease 07-28-2018 [facility]  currently due to no insurance   per cardiac cath 02-06-2015 (positive mild lateral ishcemia on stress test)--- dLAD 80%,  mLAD 40%,  mPDA 80% (small vessel),  ostial D1 70%,  CFx with lumial irregarlities-- medical management  . Depression   . Diabetic neuropathy ([place])   . Diabetic retinopathy ([place])    NPDR OU  . Headache   . History of Bell's palsy 07/2011   per pt residual facial pain on left side occasionally  . History of cancer of vagina 1999   per pt completed radiation and chemo  . History of sepsis 12/30/2017   positive blood culter fro E.coli  . Hyperlipidemia   . Hypertension   . Hypertensive retinopathy    OU  . Hypokalemia   . Insulin dependent type 2 diabetes mellitus, uncontrolled ([place])    followed by pcp---  A1c was 11.7 on 06-15-2018 in epic  . Myocardial infarction ([place])   . Nocturia   . Peripheral neuropathy   . PMB (postmenopausal bleeding)   . Wears dentures    upper  . Wears glasses     Past Gynecological History:  See  HPI No LMP recorded. Patient has had a hysterectomy.  Family Hx:  Family History  Problem Relation Age of Onset  . Heart disease Mother   . Hypertension Mother   . Diabetes Mother   . Cancer Father        hodgkins lymphoma  . Heart disease Sister        heart attack  . Diabetes Sister   . Cancer Other        parent  . Diabetes Other        parent  . Heart disease Other        parent  . Hyperlipidemia Other        parent  . Hypertension Other        parent  . Arthritis Other        parent  . Diabetes Maternal Aunt   . Diabetes Maternal Uncle   . Breast cancer Neg Hx     Review of Systems:  Constitutional  Feels well,    ENT Normal appearing ears and nares bilaterally Skin/Breast  No rash, sores, jaundice, itching, dryness Cardiovascular  No chest pain, shortness of breath, or edema  Pulmonary  No cough or wheeze.  Gastro Intestinal  No nausea, vomitting, or diarrhoea. No bright red blood per rectum, no abdominal pain, change in bowel movement, or constipation.  Genito Urinary  No frequency, urgency, dysuria, + incontinence Musculo Skeletal  No myalgia, arthralgia, joint swelling or pain  Neurologic  No weakness, numbness, change in gait,  Psychology  No depression, anxiety, insomnia.   Vitals:  Blood pressure (!) 147/87, pulse 91, temperature (!) 97.1 F (36.2 C), temperature source Tympanic, resp. rate 20, weight 285 lb 8 oz (129.5 kg), SpO2 100 %.  Physical Exam: WD in NAD Neck  Supple NROM, without any enlargements.  Lymph Node Survey No cervical supraclavicular or inguinal  adenopathy Cardiovascular  Pulse normal rate, regularity and rhythm. S1 and S2 normal.  Lungs  Clear to auscultation bilateraly, without wheezes/crackles/rhonchi. Good air movement.  Skin  No rash/lesions/breakdown  Psychiatry  Alert and oriented to person, place, and time  Abdomen  Normoactive bowel sounds, abdomen soft, non-tender and obese without evidence of hernia. Soft  incisions Back No CVA tenderness Genito Urinary  Vulva/vagina: Normal external female genitalia.  No lesions. No discharge or bleeding.  Bladder/urethra:  No lesions or masses, + increased urethral mobility  Vagina: well healed vaginal cuff, no lesions or masses or blood  Cervix: Normal appearing, no lesions. Rectal  deferred  Extremities   No bilateral cyanosis, clubbing or edema.  Thereasa Solo, MD  09/10/2020, 4:06 PM

## 2020-09-10 NOTE — Patient Instructions (Signed)
Please notify Dr Denman George at phone number [phone] if you notice vaginal bleeding, new pelvic or abdominal pains, bloating, feeling full easy, or a change in bladder or bowel function.   Try A&D ointment applied to the inner lips of the vagina for soothing of the irritation.  Please contact Dr Serita Grit office (at [phone]) in May or June to request an appointment with her for July, 2022.

## 2020-09-16 ENCOUNTER — Other Ambulatory Visit: Payer: Self-pay

## 2020-09-16 ENCOUNTER — Encounter: Payer: Self-pay | Admitting: Internal Medicine

## 2020-09-16 ENCOUNTER — Ambulatory Visit: Payer: Medicare Other | Attending: Internal Medicine | Admitting: Internal Medicine

## 2020-09-16 ENCOUNTER — Other Ambulatory Visit: Payer: Self-pay | Admitting: Internal Medicine

## 2020-09-16 VITALS — BP 153/95 | HR 95 | Resp 16 | Ht 65.0 in | Wt 281.2 lb

## 2020-09-16 DIAGNOSIS — I2510 Atherosclerotic heart disease of native coronary artery without angina pectoris: Secondary | ICD-10-CM

## 2020-09-16 DIAGNOSIS — Z1211 Encounter for screening for malignant neoplasm of colon: Secondary | ICD-10-CM

## 2020-09-16 DIAGNOSIS — Z23 Encounter for immunization: Secondary | ICD-10-CM

## 2020-09-16 DIAGNOSIS — I10 Essential (primary) hypertension: Secondary | ICD-10-CM

## 2020-09-16 DIAGNOSIS — Z0000 Encounter for general adult medical examination without abnormal findings: Secondary | ICD-10-CM

## 2020-09-16 DIAGNOSIS — R151 Fecal smearing: Secondary | ICD-10-CM | POA: Diagnosis not present

## 2020-09-16 DIAGNOSIS — N3946 Mixed incontinence: Secondary | ICD-10-CM | POA: Diagnosis not present

## 2020-09-16 DIAGNOSIS — Z7189 Other specified counseling: Secondary | ICD-10-CM

## 2020-09-16 DIAGNOSIS — F320 Major depressive disorder, single episode, mild: Secondary | ICD-10-CM | POA: Diagnosis not present

## 2020-09-16 DIAGNOSIS — Z6841 Body Mass Index (BMI) 40.0 and over, adult: Secondary | ICD-10-CM

## 2020-09-16 DIAGNOSIS — Z1231 Encounter for screening mammogram for malignant neoplasm of breast: Secondary | ICD-10-CM

## 2020-09-16 MED ORDER — OXYBUTYNIN CHLORIDE ER 5 MG PO TB24
5.0000 mg | ORAL_TABLET | Freq: Every day | ORAL | 5 refills | Status: DC
  Filled 2020-09-16: qty 30, 30d supply, fill #0

## 2020-09-16 MED ORDER — CARVEDILOL 25 MG PO TABS
ORAL_TABLET | Freq: Two times a day (BID) | ORAL | 0 refills | Status: DC
  Filled 2020-09-16: qty 60, 30d supply, fill #0

## 2020-09-16 NOTE — Patient Instructions (Signed)
If you execute a living will or healthcare power of attorney, please bring a copy for [place] to put in your record.  Please call the following number to schedule your mammogram [phone].  It is very important for you to have this done.  Please remember to get your COVID-19 booster shot.  You are overdue for this.  Start the medication called to Ditropan 5 mg daily to help decrease urinary complaints as was discussed today.  You have been referred to the gastroenterologist for colonoscopy and for fecal incontinence.  You have received the first shot of the shingles vaccine called Shingrix today.  We will plan to get the second 1 on your next visit.  Your blood pressure today is elevated.  Please remember to take your medications as soon as you return home.

## 2020-09-16 NOTE — Progress Notes (Signed)
Subjective:   Kimberly Frazier is a 60 y.o. female who presents for an Initial Medicare Annual Wellness Visit. Patient with history of DM with neuropathyand retinopathy, HTN with hypertensive heart disease, CAD(Coronary CT revealed occlusion in the mid to distal LAD, distal RCA.Cardiology states that she will need CABG eventually),HL,obesity,endometrial CA s/p hysterectomy.  Review of Systems    CVS: Blood pressure noted to be elevated today.  Patient states she has not taken carvedilol or Norvasc dosing for the morning.    Objective:    Today's Vitals   09/16/20 1032  BP: (!) 153/95  Pulse: 95  Resp: 16  SpO2: 96%  Weight: 281 lb 3.2 oz (127.6 kg)  Height: 5' 5" (1.651 m)   Body mass index is 46.79 kg/m.  General: Obese older African-American female in NAD. Ears: Both ear canal and tympanic membrane within normal limits. Neck: No lymphadenopathy or thyroid enlargement. Chest: Clear bilaterally on auscultation CVS: Regular rate rhythm Extremity: No lower extremity edema  Advanced Directives 09/16/2020 06/19/2019 11/29/2018 11/28/2018 11/24/2018 10/20/2018 08/11/2018  Does Patient Have a Medical Advance Directive? _0  No No  Does patient want to make changes to medical advance directive? Yes (Inpatient - patient defers changing a medical advance directive at this time - Information given) - - - - - -  Would patient like information on creating a medical advance directive? Yes (Inpatient - patient defers creating a medical advance directive at this time - Information given) Yes (MAU/Ambulatory/Procedural Areas - Information given) No - Patient declined - - Yes (MAU/Ambulatory/Procedural Areas - Information given) Yes (MAU/Ambulatory/Procedural Areas - Information given)  -Discussed advance care planning with patient.  Went over what is advance care planning, living will and healthcare power of attorney.  Informed her how to execute a living will/healthcare power of attorney  using the forms in our packet.  Patient given packet to take home with her.  Current Medications (verified) Outpatient Encounter Medications as of 09/16/2020  Medication Sig  . oxybutynin (DITROPAN-XL) 5 MG 24 hr tablet Take 1 tablet (5 mg total) by mouth at bedtime.  . Accu-Chek Softclix Lancets lancets Use to check blood sugar 3 times daily.  Marland Kitchen albuterol (VENTOLIN HFA) 108 (90 Base) MCG/ACT inhaler Inhale 2 puffs into the lungs every 6 (six) hours as needed for wheezing or shortness of breath. (Patient not taking: Reported on 09/16/2020)  . amLODipine (NORVASC) 10 MG tablet TAKE 1 TABLET (10 MG TOTAL) BY MOUTH DAILY.  Marland Kitchen aspirin EC 81 MG tablet Take 1 tablet (81 mg total) daily by mouth.  Marland Kitchen atorvastatin (LIPITOR) 40 MG tablet TAKE 1 TABLET DAILY BY MOUTH.  . Blood Glucose Monitoring Suppl (ACCU-CHEK GUIDE) w/Device KIT 1 kit by Does not apply route in the morning, at noon, and at bedtime. Use to check blood sugar 3 times daily.  . Bromfenac Sodium (PROLENSA) 0.07 % SOLN Place 1 drop into both eyes 4 (four) times daily. (Patient not taking: Reported on 09/10/2020)  . butalbital-acetaminophen-caffeine (FIORICET) 50-325-40 MG tablet Take 1 tablet by mouth every 6 (six) hours as needed for headache. (Patient not taking: No sig reported)  . empagliflozin (JARDIANCE) 10 MG TABS tablet TAKE 1 TABLET (10 MG TOTAL) BY MOUTH DAILY.  Marland Kitchen glucose blood (ACCU-CHEK GUIDE) test strip Use to check blood sugar 3 times daily.  . insulin aspart (NOVOLOG) 100 UNIT/ML injection INJECT 28 UNITS INTO THE SKIN 3 (THREE) TIMES DAILY BEFORE MEALS. (Patient taking differently: Inject 24 Units into the skin  3 (three) times daily before meals.)  . insulin glargine (LANTUS) 100 UNIT/ML Solostar Pen INJECT 58 UNITS INTO THE SKIN DAILY.  Marland Kitchen Insulin Syringe-Needle U-100 (BD INSULIN SYRINGE) 29G X 1/2" 0.3 ML MISC Use to inject Novolog TID.  Marland Kitchen Insulin Syringe-Needle U-100 31G X 15/64" 0.3 ML MISC USE TO INJECT NOVOLOG THREE TIMES DAILY   . isosorbide mononitrate (IMDUR) 60 MG 24 hr tablet TAKE 1 TABLET (60 MG TOTAL) BY MOUTH DAILY.  Marland Kitchen ketorolac (ACULAR) 0.5 % ophthalmic solution Place 1 drop into both eyes 4 (four) times daily.  . nitroGLYCERIN (NITROSTAT) 0.4 MG SL tablet Place 1 tab under tongue for chest pain.  May repeat after 5 minutes x 2.  DO NOT TAKE MORE THAN 3 TABS DURING AN EPISODE OF CHEST PAIN (Patient taking differently: Place 0.4 mg under the tongue every 5 (five) minutes as needed for chest pain. DO NOT TAKE MORE THAN 3 TABS DURING AN EPISODE OF CHEST PAIN)  . potassium chloride (KLOR-CON) 10 MEQ tablet TAKE 1 TABLET BY MOUTH EVERY DAY  . prednisoLONE acetate (PRED FORTE) 1 % ophthalmic suspension Place 1 drop into both eyes 4 (four) times daily.  Marland Kitchen topiramate (TOPAMAX) 50 MG tablet Take 1 tablet (50 mg total) by mouth 2 (two) times daily. (Patient not taking: No sig reported)  . TRUEPLUS PEN NEEDLES 32G X 4 MM MISC USE AS DIRECTED WITH INSULIN  . [DISCONTINUED] carvedilol (COREG) 25 MG tablet TAKE 1 TABLET (25 MG TOTAL) BY MOUTH 2 (TWO) TIMES DAILY WITH A MEAL.  . [DISCONTINUED] ciprofloxacin (CIPRO) 500 MG tablet TAKE 1 TABLET (500 MG TOTAL) BY MOUTH 2 (TWO) TIMES DAILY. (Patient not taking: No sig reported)  . [DISCONTINUED] tiZANidine (ZANAFLEX) 4 MG tablet Take 1 tablet (4 mg total) by mouth every 6 (six) hours as needed for muscle spasms. (Patient not taking: No sig reported)   No facility-administered encounter medications on file as of 09/16/2020.    Allergies (verified) Patient has no known allergies.   History: Past Medical History:  Diagnosis Date  . Adrenal adenoma, left   . Arthritis   . Cancer ([place])   . CKD (chronic kidney disease), stage II   . Coronary artery disease 07-28-2018 followed by pcp([facility])  currently due to no insurance   per cardiac cath 02-06-2015 (positive mild lateral ishcemia on stress test)--- dLAD 80%,  mLAD 40%,  mPDA 80% (small vessel),  ostial D1 70%,   CFx with lumial irregarlities-- medical management  . Depression   . Diabetic neuropathy ([place])   . Diabetic retinopathy ([place])    NPDR OU  . Headache   . History of Bell's palsy 07/2011   per pt residual facial pain on left side occasionally  . History of cancer of vagina 1999   per pt completed radiation and chemo  . History of sepsis 12/30/2017   positive blood culter fro E.coli  . Hyperlipidemia   . Hypertension   . Hypertensive retinopathy    OU  . Hypokalemia   . Insulin dependent type 2 diabetes mellitus, uncontrolled ([place])    followed by pcp---  A1c was 11.7 on 06-15-2018 in epic  . Myocardial infarction ([place])   . Nocturia   . Peripheral neuropathy   . PMB (postmenopausal bleeding)   . Wears dentures    upper  . Wears glasses    Past Surgical History:  Procedure Laterality Date  . BREAST BIOPSY  2012   benign  . CARDIAC CATHETERIZATION N/A 02/06/2015  Procedure: Left Heart Cath and Coronary Angiography;  Surgeon: Larey Dresser, MD;  Location: [facility];  Service: Cardiovascular;  Laterality: N/A;  . CATARACT EXTRACTION Bilateral OD: 03/07/19, OS: 02/28/19   Dr. Gershon Crane  . CATARACT EXTRACTION, BILATERAL    . EYE SURGERY Bilateral OD: 03/07/19, OS: 02/28/19   Cat Sx - Dr. Gershon Crane  . HYSTEROSCOPY WITH D & C N/A 08/01/2018   Procedure: DILATATION AND CURETTAGE /HYSTEROSCOPY;  Surgeon: Mora Bellman, MD;  Location: [facility];  Service: Gynecology;  Laterality: N/A;  . ROBOTIC ASSISTED TOTAL HYSTERECTOMY WITH BILATERAL SALPINGO OOPHERECTOMY N/A 11/28/2018   Procedure: XI ROBOTIC ASSISTED TOTAL HYSTERECTOMY WITH BILATERAL SALPINGO OOPHORECTOMY;  Surgeon: Everitt Amber, MD;  Location: [facility];  Service: Gynecology;  Laterality: N/A;  . SENTINEL NODE BIOPSY N/A 11/28/2018   Procedure: SENTINEL NODE BIOPSY;  Surgeon: Everitt Amber, MD;  Location: [facility];  Service: Gynecology;  Laterality: N/A;  . UMBILICAL HERNIA REPAIR  child   Family History  Problem  Relation Age of Onset  . Heart disease Mother   . Hypertension Mother   . Diabetes Mother   . Cancer Father        hodgkins lymphoma  . Heart disease Sister        heart attack  . Diabetes Sister   . Cancer Other        parent  . Diabetes Other        parent  . Heart disease Other        parent  . Hyperlipidemia Other        parent  . Hypertension Other        parent  . Arthritis Other        parent  . Diabetes Maternal Aunt   . Diabetes Maternal Uncle   . Breast cancer Neg Hx    Social History   Socioeconomic History  . Marital status: Single    Spouse name: Not on file  . Number of children: 2  . Years of education: 62  . Highest education level: Not on file  Occupational History  . Occupation: retired  Tobacco Use  . Smoking status: Never Smoker  . Smokeless tobacco: Never Used  Vaping Use  . Vaping Use: Never used  Substance and Sexual Activity  . Alcohol use: Not Currently  . Drug use: Not Currently    Types: Marijuana    Comment: 07-28-2018  per pt last smoked 11/21/2018  . Sexual activity: Yes  Other Topics Concern  . Not on file  Social History Narrative   Lives at home with her grandson.   Right-handed.   No daily caffeine use.   Social Determinants of Health   Financial Resource Strain: Not on file  Food Insecurity: Not on file  Transportation Needs: Not on file  Physical Activity: Not on file  Stress: Not on file  Social Connections: Not on file    Tobacco Counseling Counseling given: Not Answered Drinks ETOH beverages occasionally. Smokes marijauna about once a wk  Clinical Intake: Pain : No/denies pain    Diabetes: Yes CBG done?: No  Diabetic? Yes.  Addressed on last visit 07/29/2020 Has cut back on drinking Coke, sweet tea and eating junk snacks.  Eating more fruits. Not get in much exercise  Activities of Daily Living In your present state of health, do you have any difficulty performing the following activities: 09/16/2020   Hearing? N  Vision? Y  Difficulty concentrating or making decisions?  N  Walking or climbing stairs? Y  Dressing or bathing? N  Doing errands, shopping? N  Preparing Food and eating ? N  Using the Toilet? N  In the past six months, have you accidently leaked urine? Y  Do you have problems with loss of bowel control? Y  Managing your Medications? N  Managing your Finances? N  Housekeeping or managing your Housekeeping? N  Some recent data might be hidden  Has appt with Dr. Coralyn Pear 1 wk from today for eye exam. No falls. Legs feel like log walking up steps and tire easily Endorses leakage of urine with coughing, unable to hold urine when she gets urge to urinate.  Started after hysterectomy.  Incontinence of feces sometimes; alternates constipation and diarrhea.  Uses pads.  Depends not carried in her size  Patient Care Team: Ladell Pier, MD as PCP - General (Internal Medicine) Buford Dresser, MD as PCP - Cardiology (Cardiology) Everitt Amber, MD - GYN/Onc Coralyn Pear, MD -ophthalmologist  Indicate any recent Medical Services you may have received from other than [facility] providers in the past year (date may be approximate). none    Assessment:   This is a routine wellness examination for [place].  Hearing/Vision screen Whisper test normal bilaterally.  Hearing Screening   125Hz 250Hz 500Hz 1000Hz 2000Hz 3000Hz 4000Hz 6000Hz 8000Hz  Right ear:           Left ear:             Visual Acuity Screening   Right eye Left eye Both eyes  Without correction: _0 With correction:       Dietary issues and exercise activities discussed:   Has cut back on drinking Coke, sweet tea and eating junk snacks.  Eating more fruits. Not get in much exercise   Goals    . Weight (lb) < 200 lb (90.7 kg)     "I would like to lose about 40 lbs by the end of the year."          Depression Screen PHQ 2/9 Scores 09/16/2020 07/29/2020 03/28/2020 11/12/2019 07/13/2019 07/19/2018  06/15/2018  PHQ - 2 Score _1 0 5 6  PHQ- 9 Score _2 0 20 24   -Depressed sometimes;  comes and goes.  She can have several days where she is so depressed that she does not want to get out of bed.  Not sure of contributing factors.   No SI at this time.  Fall Risk Fall Risk  09/16/2020 07/29/2020 03/28/2020 07/13/2019 09/16/2015  Falls in the past year? 0 0 0 0 No  Number falls in past yr: 0 0 0 - -  Injury with Fall? 0 0 0 - -    FALL RISK PREVENTION PERTAINING TO THE HOME:  Any stairs in or around the home? Yes  If so, are there any without handrails? Yes  Home free of loose throw rugs in walkways, pet beds, electrical cords, etc? Yes  Adequate lighting in your home to reduce risk of falls? Yes   ASSISTIVE DEVICES UTILIZED TO PREVENT FALLS:  Life alert? No  .  Does not feel she needs one at this time Use of a cane, walker or w/c? No  Grab bars in the bathroom? No  Shower chair or bench in shower? No  Elevated toilet seat or a handicapped toilet? No   TIMED UP AND GO:  Was the test performed? Yes .  Length  of time to ambulate 10 feet: 8 sec.   Gait steady and fast without use of assistive device  Cognitive Function: MMSE - Mini Mental State Exam 09/16/2020  Orientation to time 5  Orientation to Place 5  Registration 3  Attention/ Calculation 5  Recall 3  Language- name 2 objects 2  Language- repeat 1  Language- follow 3 step command 3  Language- read & follow direction 1  Write a sentence 1  Copy design 0  Total score 29        Immunizations Immunization History  Administered Date(s) Administered  . Influenza,inj,Quad PF,6+ Mos 03/28/2020  . Influenza-Unspecified 03/31/2012  . PFIZER(Purple Top)SARS-COV-2 Vaccination 01/22/2020, 02/21/2020  . Pneumococcal Polysaccharide-23 07/26/2012  . Tdap 07/26/2012  . Zoster Recombinat (Shingrix) 09/16/2020    TDAP status: Up to date  Flu Vaccine status: Up to date  Pneumococcal vaccine status: Up to  date  Covid-19 vaccine status: Information provided on how to obtain vaccines.  Plans to [facility] her booster shot from [facility] or [facility].  Qualifies for Shingles Vaccine? Yes   Zostavax completed No   Shingrix Completed?: Yes -series started today.  We will plan to give second shot on her next office visit with me.  Screening Tests Health Maintenance  Topic Date Due  . MAMMOGRAM  06/17/2019  . OPHTHALMOLOGY EXAM  01/29/2020  . COVID-19 Vaccine (3 - Pfizer risk 4-dose series) 03/20/2020  . Fecal DNA (Cologuard)  06/17/2020  . INFLUENZA VACCINE  12/29/2020  . HEMOGLOBIN A1C  01/29/2021  . FOOT EXAM  03/28/2021  . PAP SMEAR-Modifier  06/15/2021  . URINE MICROALBUMIN  08/01/2021  . TETANUS/TDAP  07/26/2022  . PNEUMOCOCCAL POLYSACCHARIDE VACCINE AGE 35-64 HIGH RISK  Completed  . Hepatitis C Screening  Completed  . HIV Screening  Completed  . HPV VACCINES  Aged Out    Health Maintenance  Health Maintenance Due  Topic Date Due  . MAMMOGRAM  06/17/2019  . OPHTHALMOLOGY EXAM  01/29/2020  . COVID-19 Vaccine (3 - Pfizer risk 4-dose series) 03/20/2020  . Fecal DNA (Cologuard)  06/17/2020    Colorectal cancer screening: Referral to GI placed 09/16/20. Pt aware the office will call re: appt.  Mammogram status: Ordered 07/2020. Pt provided with contact info and advised to call to schedule appt.   Lung Cancer Screening: (Low Dose CT Chest recommended if Age 31-80 years, 30 pack-year currently smoking OR have quit w/in 15years.) does not qualify.   Lung Cancer Screening Referral: NA  Additional Screening:  Hepatitis C Screening: does qualify; Completed 04/18/17  Vision Screening: Recommended annual ophthalmology exams for early detection of glaucoma and other disorders of the eye. Is the patient up to date with their annual eye exam?  No  Who is the provider or what is the name of the office in which the patient attends annual eye exams? Dr. Coralyn Pear If pt is not established with a provider,  would they like to be referred to a provider to establish care? NA.   Dental Screening: Recommended annual dental exams for proper oral hygiene  Community Resource Referral / Chronic Care Management: CRR required this visit?  No   CCM required this visit?  No      Plan:    1. Encounter for Medicare annual wellness exam If you execute a living will or healthcare power of attorney, please bring a copy for [place] to put in your record.  Please call the following number to schedule your mammogram [phone].  It is very  important for you to have this done.  Please remember to get your COVID-19 booster shot.  You are overdue for this.  Start the medication called to Ditropan 5 mg daily to help decrease urinary complaints as was discussed today.  You have been referred to the gastroenterologist for colonoscopy and for fecal incontinence.  You have received the first shot of the shingles vaccine called Shingrix today.  We will plan to get the second 1 on your next visit.  Your blood pressure today is elevated.  Please remember to take your medications as soon as you return home.  2. Advance directive discussed with patient Discussed with the patient in detail today.  Given packet to take home for review.  Advised to bring a copy for our records should she execute a living will and healthcare power of attorney.  3. Urinary incontinence, mixed Patient has stress and urge incontinence.  We discussed management.  She is willing to try low-dose of oxybutynin.  Went over possible side effects including dry mouth, constipation, and urinary retention.  Advised to stop the medication and let me know if she develops any significant side effects. - oxybutynin (DITROPAN-XL) 5 MG 24 hr tablet; Take 1 tablet (5 mg total) by mouth at bedtime.  Dispense: 30 tablet; Refill: 5  4. Fecal smearing - Ambulatory referral to Gastroenterology  5. Major depressive disorder, single episode, mild ([place]) Patient  feels this is mild.  She declines referral to behavioral health.  Declines medication at this time.  6. Need for shingles vaccine - Varicella-zoster vaccine IM (Shingrix)  7. COVID-19 vaccine series started Encouraged her to get her COVID-19 booster shot as soon as possible.  8. Screening for colon cancer Discussed colon cancer screening methods.  She gives history of having had a colonoscopy about 10 years ago and had polyps removed.  She also has history of rectal cancer in her brother.  She is agreeable to having colonoscopy done. - Ambulatory referral to Gastroenterology  9. Encounter for screening mammogram for malignant neoplasm of breast Given the phone number to call the imaging center to have mammogram scheduled  10. Morbid obesity with BMI of 45.0-49.9, adult [facility]) Patient has set goal to try to lose about 40 pounds.  We discussed importance of continued changes in eating habits and trying to move more.  Informed about our medical weight management program.  Patient is willing to be referred to this program to help meet her weight loss goals. - Amb Ref to Medical Weight Management  11. Essential hypertension Elevated but she has not taken medicine as yet for today.  Advised to take once she returns home.  I have personally reviewed and noted the following in the patient's chart:   . Medical and social history . Use of alcohol, tobacco or illicit drugs  . Current medications and supplements . Functional ability and status . Nutritional status . Physical activity . Advanced directives . List of other physicians . Hospitalizations, surgeries, and ER visits in previous 12 months . Vitals . Screenings to include cognitive, depression, and falls . Referrals and appointments  In addition, I have reviewed and discussed with patient certain preventive protocols, quality metrics, and best practice recommendations. A written personalized care plan for preventive services as well  as general preventive health recommendations were provided to patient.     Karle Plumber, MD   09/16/2020

## 2020-09-18 ENCOUNTER — Other Ambulatory Visit: Payer: Self-pay

## 2020-09-18 MED FILL — Insulin Glargine Soln Pen-Injector 100 Unit/ML: SUBCUTANEOUS | 25 days supply | Qty: 15 | Fill #0 | Status: AC

## 2020-09-18 MED FILL — Empagliflozin Tab 10 MG: ORAL | 30 days supply | Qty: 30 | Fill #0 | Status: AC

## 2020-09-18 MED FILL — Insulin Aspart Inj 100 Unit/ML: SUBCUTANEOUS | 24 days supply | Qty: 20 | Fill #0 | Status: AC

## 2020-09-19 ENCOUNTER — Other Ambulatory Visit: Payer: Self-pay

## 2020-09-22 ENCOUNTER — Ambulatory Visit: Payer: Medicare Other | Admitting: Cardiology

## 2020-09-22 NOTE — Progress Notes (Signed)
Ernest Mallick  [facility] Note  09/23/2020     CHIEF COMPLAINT Patient presents for Retina Follow Up   HISTORY OF PRESENT ILLNESS: Celica Kotowski is a 60 y.o. female who presents to the clinic today for:   HPI    Retina Follow Up    Patient presents with  Diabetic Retinopathy.  In both eyes.  This started years ago.  Severity is moderate.  Duration of 7 months.  Since onset it is gradually worsening.  I, the attending physician,  performed the HPI with the patient and updated documentation appropriately.          Comments    60 y/o female pt here for delayed f/u for mod NPDR OU w/DME/CME.  VA OD seems a little worse.  No change noticed in [place] OS.  Denies pain, FOL, floaters.  PF and Ketorolac QID OU (does not always get in four times daily).  BS 176 a couple days ago.  Last A1C 7.?.       Last edited by Bernarda Caffey, MD on 09/23/2020  1:33 PM. (History)    pt is delayed to follow up from September 2021 due to health concerns, pt states she is on the right medications now and feel much better, she states her vision is down, she is taking PF and Ketorolac QID OU  Referring physician: Ladell Pier, MD [place],  [place] [id number]  HISTORICAL INFORMATION:   Selected notes from the MEDICAL RECORD NUMBER Referred by Dr. Rutherford Guys for concern of CME   CURRENT MEDICATIONS: Current Outpatient Medications (Ophthalmic Drugs)  Medication Sig  . Bromfenac Sodium (PROLENSA) 0.07 % SOLN Place 1 drop into both eyes 4 (four) times daily.  Marland Kitchen ketorolac (ACULAR) 0.5 % ophthalmic solution Place 1 drop into both eyes 4 (four) times daily.  . prednisoLONE acetate (PRED FORTE) 1 % ophthalmic suspension Place 1 drop into both eyes 4 (four) times daily.   No current facility-administered medications for this visit. (Ophthalmic Drugs)   Current Outpatient Medications (Other)  Medication Sig  . Accu-Chek Softclix Lancets lancets Use to check blood sugar 3  times daily.  Marland Kitchen albuterol (VENTOLIN HFA) 108 (90 Base) MCG/ACT inhaler Inhale 2 puffs into the lungs every 6 (six) hours as needed for wheezing or shortness of breath.  Marland Kitchen amLODipine (NORVASC) 10 MG tablet TAKE 1 TABLET (10 MG TOTAL) BY MOUTH DAILY.  Marland Kitchen aspirin EC 81 MG tablet Take 1 tablet (81 mg total) daily by mouth.  Marland Kitchen atorvastatin (LIPITOR) 40 MG tablet TAKE 1 TABLET DAILY BY MOUTH.  . Blood Glucose Monitoring Suppl (ACCU-CHEK GUIDE) w/Device KIT 1 kit by Does not apply route in the morning, at noon, and at bedtime. Use to check blood sugar 3 times daily.  . butalbital-acetaminophen-caffeine (FIORICET) 50-325-40 MG tablet Take 1 tablet by mouth every 6 (six) hours as needed for headache.  . carvedilol (COREG) 25 MG tablet TAKE 1 TABLET (25 MG TOTAL) BY MOUTH 2 (TWO) TIMES DAILY WITH A MEAL.  Marland Kitchen empagliflozin (JARDIANCE) 10 MG TABS tablet TAKE 1 TABLET (10 MG TOTAL) BY MOUTH DAILY.  . fluconazole (DIFLUCAN) 150 MG tablet Take 150 mg by mouth once.  Marland Kitchen glucose blood (ACCU-CHEK GUIDE) test strip Use to check blood sugar 3 times daily.  . insulin aspart (NOVOLOG) 100 UNIT/ML injection INJECT 28 UNITS INTO THE SKIN 3 (THREE) TIMES DAILY BEFORE MEALS. (Patient taking differently: Inject 24 Units into the skin 3 (three) times  daily before meals.)  . insulin glargine (LANTUS) 100 UNIT/ML Solostar Pen INJECT 58 UNITS INTO THE SKIN DAILY.  Marland Kitchen Insulin Syringe-Needle U-100 (BD INSULIN SYRINGE) 29G X 1/2" 0.3 ML MISC Use to inject Novolog TID.  Marland Kitchen Insulin Syringe-Needle U-100 31G X 15/64" 0.3 ML MISC USE TO INJECT NOVOLOG THREE TIMES DAILY  . isosorbide mononitrate (IMDUR) 60 MG 24 hr tablet TAKE 1 TABLET (60 MG TOTAL) BY MOUTH DAILY.  . nitroGLYCERIN (NITROSTAT) 0.4 MG SL tablet Place 1 tab under tongue for chest pain.  May repeat after 5 minutes x 2.  DO NOT TAKE MORE THAN 3 TABS DURING AN EPISODE OF CHEST PAIN (Patient taking differently: Place 0.4 mg under the tongue every 5 (five) minutes as needed for chest  pain. DO NOT TAKE MORE THAN 3 TABS DURING AN EPISODE OF CHEST PAIN)  . oxybutynin (DITROPAN-XL) 5 MG 24 hr tablet Take 1 tablet (5 mg total) by mouth at bedtime.  . potassium chloride (KLOR-CON) 10 MEQ tablet TAKE 1 TABLET BY MOUTH EVERY DAY  . topiramate (TOPAMAX) 50 MG tablet Take 1 tablet (50 mg total) by mouth 2 (two) times daily.  . TRUEPLUS PEN NEEDLES 32G X 4 MM MISC USE AS DIRECTED WITH INSULIN   No current facility-administered medications for this visit. (Other)      REVIEW OF SYSTEMS: ROS    Positive for: Neurological, Endocrine, Cardiovascular, Eyes   Negative for: Constitutional, Gastrointestinal, Skin, Genitourinary, Musculoskeletal, HENT, Respiratory, Psychiatric, Allergic/Imm, Heme/Lymph   Last edited by Matthew Folks, COA on 09/23/2020 12:50 PM. (History)       ALLERGIES No Known Allergies  PAST MEDICAL HISTORY Past Medical History:  Diagnosis Date  . Adrenal adenoma, left   . Arthritis   . Cancer ([place])   . CKD (chronic kidney disease), stage II   . Coronary artery disease 07-28-2018 followed [facility] pcp([facility])  currently due to no insurance   per cardiac cath 02-06-2015 (positive mild lateral ishcemia on stress test)--- dLAD 80%,  mLAD 40%,  mPDA 80% (small vessel),  ostial D1 70%,  CFx with lumial irregarlities-- medical management  . Depression   . Diabetic neuropathy ([place])   . Diabetic retinopathy ([place])    NPDR OU  . Headache   . History of Bell's palsy 07/2011   per pt residual facial pain on left side occasionally  . History of cancer of vagina 1999   per pt completed radiation and chemo  . History of sepsis 12/30/2017   positive blood culter fro E.coli  . Hyperlipidemia   . Hypertension   . Hypertensive retinopathy    OU  . Hypokalemia   . Insulin dependent type 2 diabetes mellitus, uncontrolled ([place])    followed by pcp---  A1c was 11.7 on 06-15-2018 in epic  . Myocardial infarction ([place])   . Nocturia   . Peripheral neuropathy    . PMB (postmenopausal bleeding)   . Wears dentures    upper  . Wears glasses    Past Surgical History:  Procedure Laterality Date  . BREAST BIOPSY  2012   benign  . CARDIAC CATHETERIZATION N/A 02/06/2015   Procedure: Left Heart Cath and Coronary Angiography;  Surgeon: Larey Dresser, MD;  Location: [facility];  Service: Cardiovascular;  Laterality: N/A;  . CATARACT EXTRACTION Bilateral OD: 03/07/19, OS: 02/28/19   Dr. Gershon Crane  . CATARACT EXTRACTION, BILATERAL    . EYE SURGERY Bilateral OD: 03/07/19, OS: 02/28/19   Cat Sx - Dr.  Gershon Crane  . HYSTEROSCOPY WITH D & C N/A 08/01/2018   Procedure: DILATATION AND CURETTAGE /HYSTEROSCOPY;  Surgeon: Mora Bellman, MD;  Location: [facility];  Service: Gynecology;  Laterality: N/A;  . ROBOTIC ASSISTED TOTAL HYSTERECTOMY WITH BILATERAL SALPINGO OOPHERECTOMY N/A 11/28/2018   Procedure: XI ROBOTIC ASSISTED TOTAL HYSTERECTOMY WITH BILATERAL SALPINGO OOPHORECTOMY;  Surgeon: Everitt Amber, MD;  Location: [facility];  Service: Gynecology;  Laterality: N/A;  . SENTINEL NODE BIOPSY N/A 11/28/2018   Procedure: SENTINEL NODE BIOPSY;  Surgeon: Everitt Amber, MD;  Location: [facility];  Service: Gynecology;  Laterality: N/A;  . UMBILICAL HERNIA REPAIR  child    FAMILY HISTORY Family History  Problem Relation Age of Onset  . Heart disease Mother   . Hypertension Mother   . Diabetes Mother   . Cancer Father        hodgkins lymphoma  . Heart disease Sister        heart attack  . Diabetes Sister   . Cancer Other        parent  . Diabetes Other        parent  . Heart disease Other        parent  . Hyperlipidemia Other        parent  . Hypertension Other        parent  . Arthritis Other        parent  . Diabetes Maternal Aunt   . Diabetes Maternal Uncle   . Breast cancer Neg Hx     SOCIAL HISTORY Social History   Tobacco Use  . Smoking status: Never Smoker  . Smokeless tobacco: Never Used  Vaping Use  . Vaping Use: Never used   Substance Use Topics  . Alcohol use: Not Currently  . Drug use: Not Currently    Types: Marijuana    Comment: 07-28-2018  per pt last smoked 11/21/2018         OPHTHALMIC EXAM:  Base Eye Exam    Visual Acuity (Snellen - Linear)      Right Left   Dist [place] 20/20 CF @ 3'   Dist ph [place]  NI   Correction: Glasses       Tonometry (Tonopen, 12:53 PM)      Right Left   Pressure 21 21       Pupils      Dark Light Shape React APD   Right 3 2 Round Brisk None   Left 3 2 Round Brisk None       Visual Fields (Counting fingers)      Left Right    Full Full       Extraocular Movement      Right Left    Full, Ortho Full, Ortho       Neuro/Psych    Oriented x3: Yes   Mood/Affect: Normal       Dilation    Both eyes: 1.0% Mydriacyl, 2.5% Phenylephrine @ 12:53 PM        Slit Lamp and Fundus Exam    Slit Lamp Exam      Right Left   Lids/Lashes Dermatochalasis - upper lid Dermatochalasis - upper lid   Conjunctiva/Sclera Mild Melanosis Mild Melanosis   Cornea 1+ Punctate epithelial erosions 1+ Punctate epithelial erosions, well healed temporal cataract wounds   Anterior Chamber Deep, 0.5+cell/pigment Deep, 0.5+cell/pigment   Iris Round and dilated, No NVI Round and dilated, No NVI   Lens PC IOL in good position with trace PCO  PC IOL in good position with 1+PCO   Vitreous Vitreous syneresis Vitreous syneresis       Fundus Exam      Right Left   Disc Pallor, Sharp rim Compact, Pink and Sharp, temporal PPA   C/D Ratio 0.1 0.1   Macula blunted foveal reflex, +central edema - increased scattered Microaneurysms, focal exudates temporally Blunted foveal reflex, central edema - increased, +edudate/MA/IRH   Vessels Vascular attenuation, Tortuous attenuated, Tortuous, AV crossing changes   Periphery Attached, scattered drusen, scattered DBH with somewhat white centers Attached, scattered DBH greatest posteriorly         Refraction    Manifest Refraction      Sphere Cylinder  Axis Dist VA   Right       Left -0.50 +0.50 120 20/CF@3 '          IMAGING AND PROCEDURES  Imaging and Procedures for @TODAY @  OCT, Retina - OU - Both Eyes       Right Eye Quality was good. Central Foveal Thickness: 330. Progression has worsened. Findings include abnormal foveal contour, intraretinal fluid, no SRF, intraretinal hyper-reflective material, vitreomacular adhesion  (Interval increase in IRF inferior macula).   Left Eye Quality was good. Central Foveal Thickness: 440. Progression has worsened. Findings include abnormal foveal contour, intraretinal fluid, intraretinal hyper-reflective material, vitreomacular adhesion , outer retinal atrophy, subretinal hyper-reflective material, no SRF (Interval increase in IRF temporal macula).   Notes *Images captured and stored on drive  Diagnosis / Impression:  Macular edema OU - likely combination of CME + DME OU OD: Interval increase in IRF inferior macula OS: Interval increase in IRF temporal macula  Clinical management:  See below  Abbreviations: NFP - Normal foveal profile. CME - cystoid macular edema. PED - pigment epithelial detachment. IRF - intraretinal fluid. SRF - subretinal fluid. EZ - ellipsoid zone. ERM - epiretinal membrane. ORA - outer retinal atrophy. ORT - outer retinal tubulation. SRHM - subretinal hyper-reflective material        Intravitreal Injection, Pharmacologic Agent - OD - Right Eye       Time Out 09/23/2020. 1:04 PM. Confirmed correct patient, procedure, site, and patient consented.   Anesthesia Topical anesthesia was used. Anesthetic medications included Lidocaine 2%, Proparacaine 0.5%.   Procedure Preparation included 5% betadine to ocular surface, eyelid speculum. A supplied needle was used.   Injection:  1.25 mg Bevacizumab (AVASTIN) 1.57m/0.05mL SOLN   NDC: 594854-627-03 Lot:: [id number] Expiration date: 10/28/2020   Route: Intravitreal, Site: Right Eye, Waste: 0.05 mL  Post-op Post  injection exam found visual acuity of at least counting fingers. The patient tolerated the procedure well. There were no complications. The patient received written and verbal post procedure care education. Post injection medications were not given.        Intravitreal Injection, Pharmacologic Agent - OS - Left Eye       Time Out 09/23/2020. 1:04 PM. Confirmed correct patient, procedure, site, and patient consented.   Anesthesia Topical anesthesia was used. Anesthetic medications included Lidocaine 2%, Proparacaine 0.5%.   Procedure Preparation included 5% betadine to ocular surface, eyelid speculum. A (32g) needle was used.   Injection:  2 mg aflibercept (Alfonse Flavors SOLN   NDC: 6A3590391 Lot: [id number] Expiration date: 05/30/2021   Route: Intravitreal, Site: Left Eye, Waste: 0.05 mL  Post-op Post injection exam found visual acuity of at least counting fingers. The patient tolerated the procedure well. There were no complications. The patient received written and verbal post procedure care education. Post injection  medications were not given.                 ASSESSMENT/PLAN:    ICD-10-CM   1. Moderate nonproliferative diabetic retinopathy of both eyes with macular edema associated with type 2 diabetes mellitus (HCC)  K74.2595 Intravitreal Injection, Pharmacologic Agent - OD - Right Eye    Intravitreal Injection, Pharmacologic Agent - OS - Left Eye    aflibercept (EYLEA) SOLN 2 mg    Bevacizumab (AVASTIN) SOLN 1.25 mg  2. CME (cystoid macular edema), bilateral  H35.353   3. Retinal edema  H35.81 OCT, Retina - OU - Both Eyes  4. Essential hypertension  I10   5. Hypertensive retinopathy of both eyes  H35.033   6. Pseudophakia of both eyes  Z96.1     1-3. Moderate Non-proliferative diabetic retinopathy, OU OD with combination DME/CME  **delayed to follow up from September 2021--7 mos instead of 4 wks**  - exam shows scattered IRH, edema and tortuous blood vessels OU  -  FA (10.23.20) shows leaking MA OU and paracentral petaloid hyperfluorecence OD --> CME/[place]  - OCT shows diabetic macular edema, OU; OD with CME/[place] component contributing as well  - s/p IVA OS #1 (10.23.20), #2 (11.18.20), #3 (12.18.20), #4 (02.24.21), #5 (03.24.21)  - s/p IVA OD #1 (11.06.20), #2 (12.18.20), #3 (02.24.21), #4 (03.24.21), #5 (04.21.21), #6 (5.26.21), #7 (07.26.21), #8 (08.25.21), #9 (09.23.21)  - s/p IVE OS #1 (04.21.21), # 2 (5.26.21), #3 (07.26.21), #4 (08.25.21), #5 (09.23.21)  - BCVA OD 20/20; OS CF  - OCT today shows interval increase in IRF OU due to delayed f/u  - recommend IVA OD #10 and IVE #6 OS 04.26.22  - pt wishes to proceed  - RBA of procedure discussed, questions answered  - informed consent obtained  - Avastin informed consent form signed and scanned on 12.18.2020  - Eylea OS informed consent form signed and scanned on 4.21.2021  - see procedure note  - Eylea4U benefits investigation started, 04.21.21  - cont Pred Forte and Ketorolac QID OU  - f/u in 4 weeks, DFE, OCT, possible injection OU  4,5. Hypertensive retinopathy OU  - discussed importance of tight BP control  - monitor  6. Pseudophakia OU  - s/p CE/IOL OU Gershon Crane)   OD: 03/07/2019   OS: 02/28/2019  - IOLs in good position  - CME OU as above  - monitor  Ophthalmic Meds Ordered this visit:  Meds ordered this encounter  Medications  . ketorolac (ACULAR) 0.5 % ophthalmic solution    Sig: Place 1 drop into both eyes 4 (four) times daily.    Dispense:  10 mL    Refill:  4  . prednisoLONE acetate (PRED FORTE) 1 % ophthalmic suspension    Sig: Place 1 drop into both eyes 4 (four) times daily.    Dispense:  15 mL    Refill:  4  . aflibercept (EYLEA) SOLN 2 mg  . Bevacizumab (AVASTIN) SOLN 1.25 mg       Return in about 4 weeks (around 10/21/2020) for f/u NPDR OU, DFE, OCT.  There are no Patient Instructions on file for this visit.   This document serves as a record of  services personally performed by Gardiner Sleeper, MD, PhD. It was created on their behalf by Leeann Must, [place], an ophthalmic technician. The creation of this record is the provider's dictation and/or activities during the visit.    Electronically signed by: Leeann Must, COA @TODAY @ 11:47 PM  This document serves as a record of services personally performed by Gardiner Sleeper, MD, PhD. It was created on their behalf by San Jetty. Owens Shark, OA an ophthalmic technician. The creation of this record is the provider's dictation and/or activities during the visit.    Electronically signed by: San Jetty. Marguerita Merles 04.26.2022 11:47 PM  Gardiner Sleeper, M.D., Ph.D. Diseases & Surgery of the Retina and [place] 09/23/2020   I have reviewed the above documentation for accuracy and completeness, and I agree with the above. Gardiner Sleeper, M.D., Ph.D. 09/23/20 11:50 PM  Abbreviations: M myopia (nearsighted); A astigmatism; H hyperopia (farsighted); P presbyopia; Mrx spectacle prescription;  CTL contact lenses; OD right eye; OS left eye; OU both eyes  XT exotropia; ET esotropia; PEK punctate epithelial keratitis; PEE punctate epithelial erosions; DES dry eye syndrome; MGD meibomian gland dysfunction; ATs artificial tears; PFAT's preservative free artificial tears; [place] nuclear sclerotic cataract; PSC posterior subcapsular cataract; ERM epi-retinal membrane; PVD posterior vitreous detachment; RD retinal detachment; DM diabetes mellitus; DR diabetic retinopathy; NPDR non-proliferative diabetic retinopathy; PDR proliferative diabetic retinopathy; CSME clinically significant macular edema; DME diabetic macular edema; dbh dot blot hemorrhages; CWS cotton wool spot; POAG primary open angle glaucoma; C/D cup-to-disc ratio; HVF humphrey visual field; GVF goldmann visual field; OCT optical coherence tomography; IOP intraocular pressure; BRVO Branch retinal vein occlusion; CRVO central  retinal vein occlusion; CRAO central retinal artery occlusion; BRAO branch retinal artery occlusion; RT retinal tear; SB scleral buckle; PPV pars plana vitrectomy; VH Vitreous hemorrhage; PRP panretinal laser photocoagulation; IVK intravitreal kenalog; VMT vitreomacular traction; MH Macular hole;  NVD neovascularization of the disc; NVE neovascularization elsewhere; AREDS age related eye disease study; ARMD age related macular degeneration; POAG primary open angle glaucoma; EBMD epithelial/anterior basement membrane dystrophy; ACIOL anterior chamber intraocular lens; IOL intraocular lens; PCIOL posterior chamber intraocular lens; Phaco/IOL phacoemulsification with intraocular lens placement; [place] photorefractive keratectomy; LASIK laser assisted in situ keratomileusis; HTN hypertension; DM diabetes mellitus; COPD chronic obstructive pulmonary disease

## 2020-09-23 ENCOUNTER — Ambulatory Visit (INDEPENDENT_AMBULATORY_CARE_PROVIDER_SITE_OTHER): Payer: Medicare Other | Admitting: Ophthalmology

## 2020-09-23 ENCOUNTER — Other Ambulatory Visit: Payer: Self-pay

## 2020-09-23 ENCOUNTER — Encounter (INDEPENDENT_AMBULATORY_CARE_PROVIDER_SITE_OTHER): Payer: Self-pay | Admitting: Ophthalmology

## 2020-09-23 DIAGNOSIS — I10 Essential (primary) hypertension: Secondary | ICD-10-CM

## 2020-09-23 DIAGNOSIS — Z961 Presence of intraocular lens: Secondary | ICD-10-CM

## 2020-09-23 DIAGNOSIS — H35033 Hypertensive retinopathy, bilateral: Secondary | ICD-10-CM

## 2020-09-23 DIAGNOSIS — H3581 Retinal edema: Secondary | ICD-10-CM

## 2020-09-23 DIAGNOSIS — E113313 Type 2 diabetes mellitus with moderate nonproliferative diabetic retinopathy with macular edema, bilateral: Secondary | ICD-10-CM

## 2020-09-23 DIAGNOSIS — H35353 Cystoid macular degeneration, bilateral: Secondary | ICD-10-CM

## 2020-09-23 MED ORDER — KETOROLAC TROMETHAMINE 0.5 % OP SOLN
1.0000 [drp] | Freq: Four times a day (QID) | OPHTHALMIC | 4 refills | Status: DC
  Filled 2020-09-23: qty 10, 50d supply, fill #0

## 2020-09-23 MED ORDER — AFLIBERCEPT 2MG/0.05ML IZ SOLN FOR KALEIDOSCOPE
2.0000 mg | INTRAVITREAL | Status: AC | PRN
  Administered 2020-09-23: 2 mg via INTRAVITREAL

## 2020-09-23 MED ORDER — PREDNISOLONE ACETATE 1 % OP SUSP
1.0000 [drp] | Freq: Four times a day (QID) | OPHTHALMIC | 4 refills | Status: DC
  Filled 2020-09-23: qty 15, 75d supply, fill #0

## 2020-09-23 MED ORDER — BEVACIZUMAB CHEMO INJECTION 1.25MG/0.05ML SYRINGE FOR KALEIDOSCOPE
1.2500 mg | INTRAVITREAL | Status: AC | PRN
  Administered 2020-09-23: 1.25 mg via INTRAVITREAL

## 2020-09-25 ENCOUNTER — Other Ambulatory Visit: Payer: Self-pay

## 2020-09-26 ENCOUNTER — Ambulatory Visit: Payer: Medicare Other | Admitting: Internal Medicine

## 2020-09-29 ENCOUNTER — Other Ambulatory Visit: Payer: Self-pay

## 2020-09-29 ENCOUNTER — Other Ambulatory Visit: Payer: Self-pay | Admitting: Internal Medicine

## 2020-09-29 DIAGNOSIS — I2510 Atherosclerotic heart disease of native coronary artery without angina pectoris: Secondary | ICD-10-CM

## 2020-09-29 MED ORDER — ATORVASTATIN CALCIUM 40 MG PO TABS
ORAL_TABLET | Freq: Every day | ORAL | 1 refills | Status: DC
  Filled 2020-09-29: qty 90, 90d supply, fill #0

## 2020-09-29 NOTE — Telephone Encounter (Signed)
Next OV 01/16/21.  Approved per protocol.  Requested Prescriptions  Pending Prescriptions Disp Refills  . atorvastatin (LIPITOR) 40 MG tablet 30 tablet 2    Sig: TAKE 1 TABLET DAILY BY MOUTH.     Cardiovascular:  Antilipid - Statins Passed - 09/29/2020 11:04 AM      Passed - Total Cholesterol in normal range and within 360 days    Cholesterol, Total  Date Value Ref Range Status  03/07/2020 156 100 - 199 mg/dL Final         Passed - LDL in normal range and within 360 days    LDL Chol Calc (NIH)  Date Value Ref Range Status  03/07/2020 92 0 - 99 mg/dL Final   Direct LDL  Date Value Ref Range Status  07/15/2015 93.0 mg/dL Final    Comment:    Optimal:  <100 mg/dLNear or Above Optimal:  100-129 mg/dLBorderline High:  130-159 mg/dLHigh:  160-189 mg/dLVery High:  >190 mg/dL         Passed - HDL in normal range and within 360 days    HDL  Date Value Ref Range Status  03/07/2020 41 >39 mg/dL Final         Passed - Triglycerides in normal range and within 360 days    Triglycerides  Date Value Ref Range Status  03/07/2020 130 0 - 149 mg/dL Final         Passed - Patient is not pregnant      Passed - Valid encounter within last 12 months    Recent Outpatient Visits          1 week ago Encounter for [employer] annual wellness exam   [facility] [place], Neoma Laming B, MD   2 months ago Type 2 diabetes mellitus with morbid obesity ([place])   [place], MD   3 months ago [place], [place]   6 months ago Need for influenza vaccination   [place], Jarome Matin, RPH-CPP   6 months ago Community acquired pneumonia of left lower lobe of lung   [place], Deborah B, MD      Future Appointments            In 1 week Cle[place], Jossie Ng, NP [facility], [facility]    In 2 weeks Tresa Endo, [place]   In 3 months Wynetta Emery, Dalbert Batman, MD [place]

## 2020-09-30 ENCOUNTER — Other Ambulatory Visit: Payer: Self-pay

## 2020-09-30 MED ORDER — INSULIN ASPART 100 UNIT/ML IJ SOLN: 28.0000 [IU] | Freq: Three times a day (TID) | INTRAMUSCULAR | 0 refills | Status: DC

## 2020-10-01 ENCOUNTER — Other Ambulatory Visit: Payer: Self-pay

## 2020-10-05 NOTE — Progress Notes (Signed)
Cardiology Clinic Note   Patient Name: Kimberly Frazier Date of Encounter: 10/08/2020  Primary Care Provider:  Ladell Pier, MD Primary Cardiologist:  Buford Dresser, MD  Patient Profile    Kimberly Frazier 60 year old female presents the clinic today for follow-up evaluation of her shortness of breath and essential hypertension.  Past Medical History    Past Medical History:  Diagnosis Date  . Adrenal adenoma, left   . Arthritis   . Cancer ([place])   . CKD (chronic kidney disease), stage II   . Coronary artery disease 07-28-2018 [facility]  currently due to no insurance   per cardiac cath 02-06-2015 (positive mild lateral ishcemia on stress test)--- dLAD 80%,  mLAD 40%,  mPDA 80% (small vessel),  ostial D1 70%,  CFx with lumial irregarlities-- medical management  . Depression   . Diabetic neuropathy ([place])   . Diabetic retinopathy ([place])    NPDR OU  . Headache   . History of Bell's palsy 07/2011   per pt residual facial pain on left side occasionally  . History of cancer of vagina 1999   per pt completed radiation and chemo  . History of sepsis 12/30/2017   positive blood culter fro E.coli  . Hyperlipidemia   . Hypertension   . Hypertensive retinopathy    OU  . Hypokalemia   . Insulin dependent type 2 diabetes mellitus, uncontrolled ([place])    followed by pcp---  A1c was 11.7 on 06-15-2018 in epic  . Myocardial infarction ([place])   . Nocturia   . Peripheral neuropathy   . PMB (postmenopausal bleeding)   . Wears dentures    upper  . Wears glasses    Past Surgical History:  Procedure Laterality Date  . BREAST BIOPSY  2012   benign  . CARDIAC CATHETERIZATION N/A 02/06/2015   Procedure: Left Heart Cath and Coronary Angiography;  Surgeon: Larey Dresser, MD;  Location: [facility];  Service: Cardiovascular;  Laterality: N/A;  . CATARACT EXTRACTION Bilateral OD: 03/07/19, OS: 02/28/19   Dr. Gershon Crane  . CATARACT EXTRACTION, BILATERAL     . EYE SURGERY Bilateral OD: 03/07/19, OS: 02/28/19   Cat Sx - Dr. Gershon Crane  . HYSTEROSCOPY WITH D & C N/A 08/01/2018   Procedure: DILATATION AND CURETTAGE /HYSTEROSCOPY;  Surgeon: Mora Bellman, MD;  Location: [facility];  Service: Gynecology;  Laterality: N/A;  . ROBOTIC ASSISTED TOTAL HYSTERECTOMY WITH BILATERAL SALPINGO OOPHERECTOMY N/A 11/28/2018   Procedure: XI ROBOTIC ASSISTED TOTAL HYSTERECTOMY WITH BILATERAL SALPINGO OOPHORECTOMY;  Surgeon: Everitt Amber, MD;  Location: [facility];  Service: Gynecology;  Laterality: N/A;  . SENTINEL NODE BIOPSY N/A 11/28/2018   Procedure: SENTINEL NODE BIOPSY;  Surgeon: Everitt Amber, MD;  Location: [facility];  Service: Gynecology;  Laterality: N/A;  . UMBILICAL HERNIA REPAIR  child    Allergies  No Known Allergies  History of Present Illness    Kimberly Frazier has a PMH of coronary artery disease, hypertension, hypercholesterolemia, DM2 on insulin, and obesity.  She was seen by Dr. Harrell Gave on 07/02/2020.  During that time she reported she had completed her antibiotics but was not yet back to her baseline.  She still had increased phlegm and shortness of breath.  She denied fever and chills.  She reported having an episode 3 weeks ago where she was in the store with her daughter.  She returned to her car, felt that, and briefly lost consciousness.  She reported she vomited on herself.  She denied episodes of confusion surrounding the event.  She reported that she went to bed immediately following the event that fell asleep.  She reported occasional episodes of shortness of breath.  She also reported that she had a lack of energy.  She noted that she did have exposure to secondhand smoke daily with her brother smoking.  Her blood pressure was elevated and she reported that she does not check her blood pressure at home.  She had not taken her a.m. antihypertensive medications.  When her medications were reviewed she reported that she took her Jardiance,  aspirin, potassium, carvedilol in the morning and several hours later will take her amlodipine, atorvastatin, and Imdur.  She had only been taking her carvedilol 1 time per day.  She was educated about carvedilol being twice daily medication.  She denied chest pain, PND, orthopnea, lower extremity swelling, palpitations, and unexplained weight gain.  She presents the clinic today for follow-up evaluation states she feels well.  She reports that she has been having much less phlegm and has not been noticing chest discomfort.  She has noticed some lower extremity leg pain that improves with ambulation.  She reports that her brother still smokes in her home.  We reviewed her coronary CTA and her previous echocardiogram.  We also reviewed her last lipid panel.  We reviewed the importance of heart healthy low-sodium high-fiber diet.  She reports that she has been on atorvastatin 40 for quite some time.  I will increase her atorvastatin to 80 mg daily, repeat her fasting lipids and LFTs in 8 weeks, have her monitor blood pressure and keep a log.  We will have her follow-up with Dr. Harrell Gave in 4 to 6 months.  Today she denies chest pain, shortness of breath, lower extremity edema, fatigue, palpitations, melena, hematuria, hemoptysis, diaphoresis, weakness, presyncope, syncope, orthopnea, and PND.   Home Medications    Prior to Admission medications   Medication Sig Start Date End Date Taking? Authorizing Provider  Accu-Chek Softclix Lancets lancets Use to check blood sugar 3 times daily. 08/27/19   Ladell Pier, MD  albuterol (VENTOLIN HFA) 108 (90 Base) MCG/ACT inhaler Inhale 2 puffs into the lungs every 6 (six) hours as needed for wheezing or shortness of breath. 11/12/19   Ladell Pier, MD  amLODipine (NORVASC) 10 MG tablet TAKE 1 TABLET (10 MG TOTAL) BY MOUTH DAILY. 07/29/20 07/29/21  Ladell Pier, MD  aspirin EC 81 MG tablet Take 1 tablet (81 mg total) daily by mouth. 04/18/17   Ladell Pier, MD  atorvastatin (LIPITOR) 40 MG tablet TAKE 1 TABLET DAILY BY MOUTH. 09/29/20 09/29/21  Ladell Pier, MD  Blood Glucose Monitoring Suppl (ACCU-CHEK GUIDE) w/Device KIT 1 kit by Does not apply route in the morning, at noon, and at bedtime. Use to check blood sugar 3 times daily. 08/27/19   Ladell Pier, MD  Bromfenac Sodium (PROLENSA) 0.07 % SOLN Place 1 drop into both eyes 4 (four) times daily. 03/23/19   Bernarda Caffey, MD  butalbital-acetaminophen-caffeine (FIORICET) [phone] MG tablet Take 1 tablet by mouth every 6 (six) hours as needed for headache. 12/21/19   Marcial Pacas, MD  carvedilol (COREG) 25 MG tablet TAKE 1 TABLET (25 MG TOTAL) BY MOUTH 2 (TWO) TIMES DAILY WITH A MEAL. 09/16/20 09/16/21  Ladell Pier, MD  empagliflozin (JARDIANCE) 10 MG TABS tablet TAKE 1 TABLET (10 MG TOTAL) BY MOUTH DAILY. 03/28/20 03/28/21  Ladell Pier, MD  fluconazole (DIFLUCAN)  150 MG tablet Take 150 mg by mouth once. 06/11/20   [provider]  glucose blood (ACCU-CHEK GUIDE) test strip Use to check blood sugar 3 times daily. 08/27/19   Ladell Pier, MD  insulin aspart (NOVOLOG) 100 UNIT/ML injection Inject 28 Units into the skin 3 (three) times daily before meals. 12/18/19   Ladell Pier, MD  insulin glargine (LANTUS) 100 UNIT/ML Solostar Pen INJECT 58 UNITS INTO THE SKIN DAILY. 03/28/20 03/28/21  Ladell Pier, MD  Insulin Syringe-Needle U-100 (BD INSULIN SYRINGE) 29G X 1/2" 0.3 ML MISC Use to inject Novolog TID. 05/07/20   Ladell Pier, MD  Insulin Syringe-Needle U-100 31G X 15/64" 0.3 ML MISC USE TO INJECT NOVOLOG THREE TIMES DAILY 05/07/20 05/07/21  Ladell Pier, MD  isosorbide mononitrate (IMDUR) 60 MG 24 hr tablet TAKE 1 TABLET (60 MG TOTAL) BY MOUTH DAILY. 02/14/20 02/13/21  Buford Dresser, MD  ketorolac (ACULAR) 0.5 % ophthalmic solution Place 1 drop into both eyes 4 (four) times daily. 09/23/20 09/23/21  Bernarda Caffey, MD  nitroGLYCERIN  (NITROSTAT) 0.4 MG SL tablet Place 1 tab under tongue for chest pain.  May repeat after 5 minutes x 2.  DO NOT TAKE MORE THAN 3 TABS DURING AN EPISODE OF CHEST PAIN Patient taking differently: Place 0.4 mg under the tongue every 5 (five) minutes as needed for chest pain. DO NOT TAKE MORE THAN 3 TABS DURING AN EPISODE OF CHEST PAIN 09/18/18   Buford Dresser, MD  oxybutynin (DITROPAN-XL) 5 MG 24 hr tablet Take 1 tablet (5 mg total) by mouth at bedtime. 09/16/20   Ladell Pier, MD  potassium chloride (KLOR-CON) 10 MEQ tablet TAKE 1 TABLET BY MOUTH EVERY DAY 06/19/20   Ladell Pier, MD  prednisoLONE acetate (PRED FORTE) 1 % ophthalmic suspension Place 1 drop into both eyes 4 (four) times daily. 09/23/20   Bernarda Caffey, MD  topiramate (TOPAMAX) 50 MG tablet Take 1 tablet (50 mg total) by mouth 2 (two) times daily. 12/21/19   Marcial Pacas, MD  TRUEPLUS PEN NEEDLES 32G X 4 MM MISC USE AS DIRECTED WITH INSULIN 10/30/19   Ladell Pier, MD    Family History    Family History  Problem Relation Age of Onset  . Heart disease Mother   . Hypertension Mother   . Diabetes Mother   . Cancer Father        hodgkins lymphoma  . Heart disease Sister        heart attack  . Diabetes Sister   . Cancer Other        parent  . Diabetes Other        parent  . Heart disease Other        parent  . Hyperlipidemia Other        parent  . Hypertension Other        parent  . Arthritis Other        parent  . Diabetes Maternal Aunt   . Diabetes Maternal Uncle   . Breast cancer Neg Hx    She indicated that her mother is deceased. She indicated that her father is deceased. She indicated that her sister is alive. She indicated that the status of her maternal aunt is unknown. She indicated that the status of her maternal uncle is unknown. She indicated that the status of her neg hx is unknown. She indicated that the status of her other is unknown.  Social History  Social History   Socioeconomic  History  . Marital status: Single    Spouse name: Not on file  . Number of children: 2  . Years of education: 66  . Highest education level: Not on file  Occupational History  . Occupation: retired  Tobacco Use  . Smoking status: Never Smoker  . Smokeless tobacco: Never Used  Vaping Use  . Vaping Use: Never used  Substance and Sexual Activity  . Alcohol use: Not Currently  . Drug use: Not Currently    Types: Marijuana    Comment: 07-28-2018  per pt last smoked 11/21/2018  . Sexual activity: Yes  Other Topics Concern  . Not on file  Social History Narrative   Lives at home with her grandson.   Right-handed.   No daily caffeine use.   Social Determinants of Health   Financial Resource Strain: Not on file  Food Insecurity: Not on file  Transportation Needs: Not on file  Physical Activity: Not on file  Stress: Not on file  Social Connections: Not on file  Intimate Partner Violence: Not on file     Review of Systems    General:  No chills, fever, night sweats or weight changes.  Cardiovascular:  No chest pain, dyspnea on exertion, edema, orthopnea, palpitations, paroxysmal nocturnal dyspnea. Dermatological: No rash, lesions/masses Respiratory: No cough, dyspnea Urologic: No hematuria, dysuria Abdominal:   No nausea, vomiting, diarrhea, bright red blood per rectum, melena, or hematemesis Neurologic:  No visual changes, wkns, changes in mental status. All other systems reviewed and are otherwise negative except as noted above.  Physical Exam    VS:  BP 132/84 (BP Location: Left Arm, Patient Position: Sitting, Cuff Size: Large)   Pulse 94   Ht 5' 4"  (1.626 m)   Wt 282 lb 9.6 oz (128.2 kg)   SpO2 95%   BMI 48.51 kg/m  , BMI Body mass index is 48.51 kg/m. GEN: Well nourished, well developed, in no acute distress. HEENT: normal. Neck: Supple, no JVD, carotid bruits, or masses. Cardiac: RRR, no murmurs, rubs, or gallops. No clubbing, cyanosis, edema.  Radials/DP/PT 2+  and equal bilaterally.  Respiratory:  Respirations regular and unlabored, clear to auscultation bilaterally. GI: Soft, nontender, nondistended, BS + x 4. MS: no deformity or atrophy. Skin: warm and dry, no rash. Neuro:  Strength and sensation are intact. Psych: Normal affect.  Accessory Clinical Findings    Recent Labs: 12/21/2019: TSH 1.050 03/24/2020: B Natriuretic Peptide 64.4; BUN 15; Creatinine, Ser 1.23; Hemoglobin 12.7; Platelets 247; Potassium 3.0; Sodium 137   Recent Lipid Panel    Component Value Date/Time   CHOL 156 03/07/2020 0925   TRIG 130 03/07/2020 0925   HDL 41 03/07/2020 0925   CHOLHDL 3.8 03/07/2020 0925   CHOLHDL 5 07/15/2015 1513   VLDL 44.0 (H) 07/15/2015 1513   LDLCALC 92 03/07/2020 0925   LDLDIRECT 93.0 07/15/2015 1513    ECG personally reviewed by me today-none today.  Echocardiogram 04/21/2020 1. Left ventricular ejection fraction, by estimation, is 50 to 55%. The  left ventricle has low normal function. The left ventricle demonstrates  regional wall motion abnormalities (see scoring diagram/findings for  description). Left ventricular diastolic  parameters are consistent with Grade I diastolic dysfunction (impaired  relaxation). Elevated left atrial pressure.  2. Right ventricular systolic function is normal. The right ventricular  size is normal.  3. Large pericardial effusion. The pericardial effusion is  circumferential. There is no evidence of cardiac tamponade. Moderate  pleural  effusion in the left lateral region.  4. The mitral valve is normal in structure. Mild mitral valve  regurgitation. No evidence of mitral stenosis.  5. The aortic valve is normal in structure. Aortic valve regurgitation is  not visualized. No aortic stenosis is present.  6. Aortic dilatation noted. There is mild dilatation of the ascending  aorta, measuring 40 mm.  7. The inferior vena cava is normal in size with greater than 50%  respiratory variability,  suggesting right atrial pressure of 3 mmHg.   Comparison(s): Unchanged pericardial effusion from 09/06/2018.   CT coronary 09/29/19 Coronary calcium score: The patient's coronary artery calcium score is 350, which places the patient in the 97th percentile.  Coronary arteries: Normal coronary origins. Right dominance.  Right Coronary Artery: Dominant. Mild 25-49% non-calcified plaque of the mid-vessel (CADRADS2).  Left Main Coronary Artery: Eccentric calcification with minimal 1-24% mixed stenosis (CADRADS1). Bifurcates into the LAD and LCx vessels.  Left Anterior Descending Coronary Artery: The LAD does not reach the apex and may be occluded in the distal portion. There is moderate 50-69% (CADRADS3) mid-vessel non-calcified stenosis.  Left Circumflex Artery: Minimal distal 1-24% diffuse non-calcified stenosis. Smaller distal OM1 branch has calcified plaque with mild to moderate stenosis.  Aorta: Normal size, 37 mm at the mid ascending aorta (level of the PA bifurcation) measured double oblique. No calcifications. No dissection.  Aortic Valve: Trileaflet. No calcifications.  Other findings:  Normal pulmonary vein drainage into the left atrium.  Normal left atrial appendage without a thrombus.  Dilated main pulmonary artery measuring 32 mm, suggestive of pulmonary hypertension.  Moderate sized circumferential pericardial effusion.  IMPRESSION: 1. Moderate non-calcified mid-LAD stenosis, possible distal LAD occlusion, CADRADS = 3. CT FFR will be performed and reported separately.  2. Coronary calcium score of 350. This was 97th percentile for age and sex matched control.  3. Normal coronary origin with right dominance.  4. Moderate-sized circumferential pericardial effusion  5. Dilated main pulmonary artery to 32 mm, suggestive of pulmonary hypertension  6. See radiology findings regarding lung parenchymal changes   Assessment & Plan   1.   Essential hypertension-BP today 132/84.  Well-controlled at home. Continue carvedilol, amlodipine, Imdur Heart healthy low-sodium diet-salty 6 given Increase physical activity as tolerated Maintain blood pressure log  Coronary artery disease-no chest pain today.  No recent episodes of arm neck or back pain.  Coronary CTA 09/29/2019 showed RCA 25-49 percent plaque, LAD 1-24% and 50-69%, left circumflex 1-24%.  Details above. Continue amlodipine, Imdur, nitroglycerin, aspirin, carvedilol Heart healthy low-sodium diet-salty 6 given Increase physical activity as tolerated  Shortness of breath- resolved.  Only occasional cough, and stable breathing.  Previously prescribed azithromycin and noticed only slight improvement.  Felt to be related to secondhand smoke. Follows with PCP  Hyperlipidemia-03/07/2020: Cholesterol, Total 156; HDL 41; LDL Chol Calc (NIH) 92; Triglycerides 130 Continue atorvastatin Heart healthy low-sodium high-fiber diet Increase physical activity as tolerated Lipid and Lfts in 8 weeks  Pericardial effusion- no chest pain today.  Previous ESR normal, CRP elevated. Continue to monitor.  Disposition: Follow-up with Dr. Harrell Gave in 4 to 6 months.   Jossie Ng. Agatha Duplechain NP-C    10/08/2020, 3:58 PM [facility] [address] Office [phone] Fax [phone]  Notice: This dictation was prepared with Dragon dictation along with smaller phrase technology. Any transcriptional errors that result from this process are unintentional and may not be corrected upon review.  I spent 15 minutes examining this patient,  reviewing medications, and using patient centered shared decision making involving her cardiac care.  Prior to her visit I spent greater than 20 minutes reviewing her past medical history,  medications, and prior cardiac tests.

## 2020-10-06 ENCOUNTER — Other Ambulatory Visit: Payer: Self-pay

## 2020-10-08 ENCOUNTER — Other Ambulatory Visit: Payer: Self-pay

## 2020-10-08 ENCOUNTER — Encounter: Payer: Self-pay | Admitting: General Practice

## 2020-10-08 ENCOUNTER — Ambulatory Visit (INDEPENDENT_AMBULATORY_CARE_PROVIDER_SITE_OTHER): Payer: Medicare Other | Admitting: General Practice

## 2020-10-08 VITALS — BP 132/84 | HR 94 | Ht 64.0 in | Wt 282.6 lb

## 2020-10-08 DIAGNOSIS — I25118 Atherosclerotic heart disease of native coronary artery with other forms of angina pectoris: Secondary | ICD-10-CM

## 2020-10-08 DIAGNOSIS — I3139 Other pericardial effusion (noninflammatory): Secondary | ICD-10-CM

## 2020-10-08 DIAGNOSIS — Z79899 Other long term (current) drug therapy: Secondary | ICD-10-CM

## 2020-10-08 DIAGNOSIS — I2510 Atherosclerotic heart disease of native coronary artery without angina pectoris: Secondary | ICD-10-CM

## 2020-10-08 DIAGNOSIS — I10 Essential (primary) hypertension: Secondary | ICD-10-CM

## 2020-10-08 DIAGNOSIS — R0602 Shortness of breath: Secondary | ICD-10-CM | POA: Diagnosis not present

## 2020-10-08 DIAGNOSIS — E7800 Pure hypercholesterolemia, unspecified: Secondary | ICD-10-CM | POA: Diagnosis not present

## 2020-10-08 DIAGNOSIS — I313 Pericardial effusion (noninflammatory): Secondary | ICD-10-CM

## 2020-10-08 MED ORDER — ATORVASTATIN CALCIUM 80 MG PO TABS
80.0000 mg | ORAL_TABLET | Freq: Every day | ORAL | 3 refills | Status: DC
  Filled 2020-10-08 – 2020-10-15 (×2): qty 30, 30d supply, fill #0
  Filled 2020-12-08: qty 30, 30d supply, fill #1
  Filled 2021-01-25: qty 30, 30d supply, fill #2
  Filled 2021-04-10: qty 30, 30d supply, fill #3

## 2020-10-08 NOTE — Patient Instructions (Addendum)
Medication Instructions:  INCREASE ATORVASTATIN 80MG  DAILY *If you need a refill on your cardiac medications before your next appointment, please call your pharmacy*  Lab Work: FASTING LIPID AND LFT IN 8 WEEKS If you have labs (blood work) drawn today and your tests are completely normal, you will receive your results only by:  [facility] (if you have MyChart) OR A paper copy in the mail.  If you have any lab test that is abnormal or we need to change your treatment, we will call you to review the results. You may go to any [facility] that is convenient for you however, we do have a lab in our office that is able to assist you. You DO NOT need an appointment for our lab. The lab is open 8:00am and closes at 4:00pm. Lunch 12:45 - 1:45pm.  Special Instructions PLEASE READ AND FOLLOW FIBER DIET-ATTACHED  PLEASE INCREASE PHYSICAL ACTIVITY AS TOLERATED  Follow-Up: Your next appointment:  4-6 month(s) In Person with Buford Dresser, MD   At [facility], you and your health needs are our priority.  As part of our continuing mission to provide you with exceptional heart care, we have created designated Provider Care Teams.  These Care Teams include your primary Cardiologist (physician) and Advanced Practice Providers (APPs -  Physician Assistants and Nurse Practitioners) who all work together to provide you with the care you need, when you need it.    Heart-Healthy Eating Plan Heart-healthy meal planning includes:  Eating less unhealthy fats.  Eating more healthy fats.  Making other changes in your diet. Talk with your doctor or a diet specialist (dietitian) to create an eating plan that is right for you. What are tips for following this plan? Cooking Avoid frying your food. Try to bake, boil, grill, or broil it instead. You can also reduce fat by:  Removing the skin from poultry.  Removing all visible fats from meats.  Steaming vegetables in water or broth. Meal  planning  At meals, divide your plate into four equal parts: ? Fill one-half of your plate with vegetables and green salads. ? Fill one-fourth of your plate with whole grains. ? Fill one-fourth of your plate with lean protein foods.  Eat 4-5 servings of vegetables per day. A serving of vegetables is: ? 1 cup of raw or cooked vegetables. ? 2 cups of raw leafy greens.  Eat 4-5 servings of fruit per day. A serving of fruit is: ? 1 medium whole fruit. ?  cup of dried fruit. ?  cup of fresh, frozen, or canned fruit. ?  cup of 100% fruit juice.  Eat more foods that have soluble fiber. These are apples, broccoli, carrots, beans, peas, and barley. Try to get 20-30 g of fiber per day.  Eat 4-5 servings of nuts, legumes, and seeds per week: ? 1 serving of dried beans or legumes equals  cup after being cooked. ? 1 serving of nuts is  cup. ? 1 serving of seeds equals 1 tablespoon.   General information  Eat more home-cooked food. Eat less restaurant, buffet, and fast food.  Limit or avoid alcohol.  Limit foods that are high in starch and sugar.  Avoid fried foods.  Lose weight if you are overweight.  Keep track of how much salt (sodium) you eat. This is important if you have high blood pressure. Ask your doctor to tell you more about this.  Try to add vegetarian meals each week. Fats  Choose healthy fats. These include olive  oil and canola oil, flaxseeds, walnuts, almonds, and seeds.  Eat more omega-3 fats. These include salmon, mackerel, sardines, tuna, flaxseed oil, and ground flaxseeds. Try to eat fish at least 2 times each week.  Check food labels. Avoid foods with trans fats or high amounts of saturated fat.  Limit saturated fats. ? These are often found in animal products, such as meats, butter, and cream. ? These are also found in plant foods, such as palm oil, palm kernel oil, and coconut oil.  Avoid foods with partially hydrogenated oils in them. These have trans  fats. Examples are stick margarine, some tub margarines, cookies, crackers, and other baked goods. What foods can I eat? Fruits All fresh, canned (in natural juice), or frozen fruits. Vegetables Fresh or frozen vegetables (raw, steamed, roasted, or grilled). Green salads. Grains Most grains. Choose whole wheat and whole grains most of the time. Rice and pasta, including brown rice and pastas made with whole wheat. Meats and other proteins Lean, well-trimmed beef, veal, pork, and lamb. Chicken and [place] without skin. All fish and shellfish. Wild duck, rabbit, pheasant, and venison. Egg whites or low-cholesterol egg substitutes. Dried beans, peas, lentils, and tofu. Seeds and most nuts. Dairy Low-fat or nonfat cheeses, including ricotta and mozzarella. Skim or 1% milk that is liquid, powdered, or evaporated. Buttermilk that is made with low-fat milk. Nonfat or low-fat yogurt. Fats and oils Non-hydrogenated (trans-free) margarines. Vegetable oils, including soybean, sesame, sunflower, olive, peanut, safflower, corn, canola, and cottonseed. Salad dressings or mayonnaise made with a vegetable oil. Beverages Mineral water. Coffee and tea. Diet carbonated beverages. Sweets and desserts Sherbet, gelatin, and fruit ice. Small amounts of dark chocolate. Limit all sweets and desserts. Seasonings and condiments All seasonings and condiments. The items listed above may not be a complete list of foods and drinks you can eat. Contact a dietitian for more options. What foods should I avoid? Fruits Canned fruit in heavy syrup. Fruit in cream or butter sauce. Fried fruit. Limit coconut. Vegetables Vegetables cooked in cheese, cream, or butter sauce. Fried vegetables. Grains Breads that are made with saturated or trans fats, oils, or whole milk. Croissants. Sweet rolls. Donuts. High-fat crackers, such as cheese crackers. Meats and other proteins Fatty meats, such as hot dogs, ribs, sausage, bacon,  rib-eye roast or steak. High-fat deli meats, such as salami and bologna. Caviar. Domestic duck and goose. Organ meats, such as liver. Dairy Cream, sour cream, cream cheese, and creamed cottage cheese. Whole-milk cheeses. Whole or 2% milk that is liquid, evaporated, or condensed. Whole buttermilk. Cream sauce or high-fat cheese sauce. Yogurt that is made from whole milk. Fats and oils Meat fat, or shortening. Cocoa butter, hydrogenated oils, palm oil, coconut oil, palm kernel oil. Solid fats and shortenings, including bacon fat, salt pork, lard, and butter. Nondairy cream substitutes. Salad dressings with cheese or sour cream. Beverages Regular sodas and juice drinks with added sugar. Sweets and desserts Frosting. Pudding. Cookies. Cakes. Pies. Milk chocolate or white chocolate. Buttered syrups. Full-fat ice cream or ice cream drinks. The items listed above may not be a complete list of foods and drinks to avoid. Contact a dietitian for more information. Summary  Heart-healthy meal planning includes eating less unhealthy fats, eating more healthy fats, and making other changes in your diet.  Eat a balanced diet. This includes fruits and vegetables, low-fat or nonfat dairy, lean protein, nuts and legumes, whole grains, and heart-healthy oils and fats. This information is not intended to replace advice given  to you by your health care provider. Make sure you discuss any questions you have with your health care provider. Document Revised: 07/21/2017 Document Reviewed: 06/24/2017 Elsevier Patient Education  2021 [employer].

## 2020-10-15 ENCOUNTER — Other Ambulatory Visit: Payer: Self-pay

## 2020-10-15 MED FILL — Amlodipine Besylate Tab 10 MG (Base Equivalent): ORAL | 90 days supply | Qty: 90 | Fill #0 | Status: AC

## 2020-10-15 MED FILL — Insulin Glargine Soln Pen-Injector 100 Unit/ML: SUBCUTANEOUS | 25 days supply | Qty: 15 | Fill #1 | Status: AC

## 2020-10-16 ENCOUNTER — Ambulatory Visit: Payer: Medicare Other | Attending: Internal Medicine | Admitting: Pharmacist

## 2020-10-16 ENCOUNTER — Encounter: Payer: Self-pay | Admitting: Pharmacist

## 2020-10-16 ENCOUNTER — Other Ambulatory Visit: Payer: Self-pay

## 2020-10-16 VITALS — BP 128/78

## 2020-10-16 DIAGNOSIS — I10 Essential (primary) hypertension: Secondary | ICD-10-CM

## 2020-10-16 NOTE — Progress Notes (Signed)
   S:    PCP: Karle Plumber PMH: HTN, T2DM, HLD, CAD, Migraines, CVD   Patient arrives within good spirits. Presents to the clinic for hypertension evaluation, counseling, and management. Patient was referred and last seen by Primary Care Provider on 09/16/20 for Annual Medicare Wellness visit and seen by Cardiology for hypertension on 10/08/20. Of note, BP at that cardiology visit was 132/84.    Today pt denies symptoms of headache,dizziness, peripheral edema and chest pain. She endorses she has not checked her BP at home in a few months but they used to average around 165s/90s-100s. Pt endorses she has had carvedilol this morning but takes all other antihypertensives around 2:30 pm daily. Hasn't missed a dose of medication in over a month.    Current BP Medications include:   Amlodipine 10 mg tab - Take 1 tab PO daily Carvedilol 25 mg tab  - Take 1 tab PO BID with meals   Isosorbide mononitrate 60 mg 24hr tab - Take 1 tab PO daily   Antihypertensives tried in the past include:  Losartan-hctz 100 - 25 mg tab - Take 1 tab PO daily   Dietary habits include: baked, grilled, or boiled meats such as fish or chicken with an occasional craving for fried chicken. States she tries to eat veggies and fruits but enjoys approximately 2 cups of soda (coke) per day and also drinks water.   Exercise habits include: no exercise, wants to get in touch with medicare for silver sneakers program  Family / Social history:  Family Hx: none reported Alcohol: drinks once every 3 months  Tobacco: smokes marijuana weekly.  O: Vitals:   10/16/20 1339  BP: 128/78   Rechecked manually showed: 122/72 mmHg Pulse today: 74    Last 3 Office BP readings: BP Readings from Last 3 Encounters:  10/16/20 128/78  10/08/20 132/84  09/16/20 (!) 153/95   BMET    Component Value Date/Time   NA 137 03/24/2020 1427   NA 142 01/15/2020 1454   K 3.0 (L) 03/24/2020 1427   CL 100 03/24/2020 1427   CO2 28 03/24/2020  1427   GLUCOSE 348 (H) 03/24/2020 1427   BUN 15 03/24/2020 1427   BUN 19 01/15/2020 1454   CREATININE 1.23 (H) 03/24/2020 1427   CALCIUM 8.8 (L) 03/24/2020 1427   GFRNONAA 50 (L) 03/24/2020 1427   GFRAA 61 01/15/2020 1454    Renal function: CrCl cannot be calculated (Patient's most recent lab result is older than the maximum 21 days allowed.).  Clinical ASCVD: CAD  A/P: Hypertension longstanding currently controlled on current medications. BP Goal = <130/80 mmHg. Medication adherence reported. Continue current regimen at this time. Counseled on lifestyle modifications for blood pressure control including reduced dietary sodium, increased exercise, and adequate sleep. Pt is able to verbalize BP goals and regimen.  -Continued current medication regimen.   Results reviewed and written information provided.   Total time in face-to-face counseling 30 minutes.   F/U Clinic Visit with PCP in August.    Patient seen with: [place] Almetta Liddicoat, PharmD Candidate  [facility] [address] Daisy Blossom, PharmD, [place], [place] [phone]

## 2020-10-17 NOTE — Progress Notes (Signed)
Kimberly Frazier  [facility] Note  10/22/2020     CHIEF COMPLAINT Patient presents for Retina Follow Up   HISTORY OF PRESENT ILLNESS: Kimberly Frazier is a 60 y.o. female who presents to the clinic today for:   HPI    Retina Follow Up    Patient presents with  Diabetic Retinopathy.  In both eyes.  This started 4 weeks ago.  Since onset it is stable.  I, the attending physician,  performed the HPI with the patient and updated documentation appropriately.          Comments    Pt here for 4 week exu eval NPDR OU. Pt states no changes vision wise. No ocular pain or discomfort.        Last edited by Bernarda Caffey, MD on 10/23/2020 10:40 PM. (History)    pt feels like her right eye may be a little more blurry today, she says her BP and BG have been good   Referring physician: Ladell Pier, MD [place],  [place] [address]  HISTORICAL INFORMATION:   Selected notes from the MEDICAL RECORD NUMBER Referred by Dr. Rutherford Guys for concern of CME   CURRENT MEDICATIONS: Current Outpatient Medications (Ophthalmic Drugs)  Medication Sig  . Bromfenac Sodium (PROLENSA) 0.07 % SOLN Place 1 drop into both eyes 4 (four) times daily.  Marland Kitchen ketorolac (ACULAR) 0.5 % ophthalmic solution Place 1 drop into both eyes 4 (four) times daily.  . prednisoLONE acetate (PRED FORTE) 1 % ophthalmic suspension Place 1 drop into both eyes 4 (four) times daily.   No current facility-administered medications for this visit. (Ophthalmic Drugs)   Current Outpatient Medications (Other)  Medication Sig  . Accu-Chek Softclix Lancets lancets Use to check blood sugar 3 times daily.  Marland Kitchen amLODipine (NORVASC) 10 MG tablet TAKE 1 TABLET (10 MG TOTAL) BY MOUTH DAILY.  Marland Kitchen aspirin EC 81 MG tablet Take 1 tablet (81 mg total) daily by mouth.  Marland Kitchen atorvastatin (LIPITOR) 80 MG tablet Take 1 tablet (80 mg total) by mouth daily.  . Blood Glucose Monitoring Suppl (ACCU-CHEK GUIDE) w/Device KIT 1 kit by  Does not apply route in the morning, at noon, and at bedtime. Use to check blood sugar 3 times daily.  . butalbital-acetaminophen-caffeine (FIORICET) 50-325-40 MG tablet Take 1 tablet by mouth every 6 (six) hours as needed for headache.  . carvedilol (COREG) 25 MG tablet TAKE 1 TABLET (25 MG TOTAL) BY MOUTH 2 (TWO) TIMES DAILY WITH A MEAL.  Marland Kitchen empagliflozin (JARDIANCE) 10 MG TABS tablet TAKE 1 TABLET (10 MG TOTAL) BY MOUTH DAILY.  . fluconazole (DIFLUCAN) 150 MG tablet Take 150 mg by mouth once.  Marland Kitchen glucose blood (ACCU-CHEK GUIDE) test strip Use to check blood sugar 3 times daily.  . insulin aspart (NOVOLOG) 100 UNIT/ML injection Inject 28 Units into the skin 3 (three) times daily before meals.  . insulin glargine (LANTUS) 100 UNIT/ML Solostar Pen INJECT 58 UNITS INTO THE SKIN DAILY.  Marland Kitchen Insulin Syringe-Needle U-100 (BD INSULIN SYRINGE) 29G X 1/2" 0.3 ML MISC Use to inject Novolog TID.  Marland Kitchen Insulin Syringe-Needle U-100 31G X 15/64" 0.3 ML MISC USE TO INJECT NOVOLOG THREE TIMES DAILY  . isosorbide mononitrate (IMDUR) 60 MG 24 hr tablet TAKE 1 TABLET (60 MG TOTAL) BY MOUTH DAILY.  . nitroGLYCERIN (NITROSTAT) 0.4 MG SL tablet Place 1 tab under tongue for chest pain.  May repeat after 5 minutes x 2.  DO NOT TAKE  MORE THAN 3 TABS DURING AN EPISODE OF CHEST PAIN (Patient taking differently: Place 1 tab under tongue for chest pain.  May repeat after 5 minutes x 2.  DO NOT TAKE MORE THAN 3 TABS DURING AN EPISODE OF CHEST PAIN)  . oxybutynin (DITROPAN-XL) 5 MG 24 hr tablet Take 1 tablet (5 mg total) by mouth at bedtime.  . potassium chloride (KLOR-CON) 10 MEQ tablet TAKE 1 TABLET BY MOUTH EVERY DAY  . TRUEPLUS PEN NEEDLES 32G X 4 MM MISC USE AS DIRECTED WITH INSULIN   No current facility-administered medications for this visit. (Other)      REVIEW OF SYSTEMS: ROS    Positive for: Neurological, Endocrine, Cardiovascular, Eyes   Negative for: Constitutional, Gastrointestinal, Skin, Genitourinary,  Musculoskeletal, HENT, Respiratory, Psychiatric, Allergic/Imm, Heme/Lymph   Last edited by Kingsley Spittle, COT on 10/22/2020  1:22 PM. (History)       ALLERGIES No Known Allergies  PAST MEDICAL HISTORY Past Medical History:  Diagnosis Date  . Adrenal adenoma, left   . Arthritis   . Cancer ([place])   . CKD (chronic kidney disease), stage II   . Coronary artery disease 07-28-2018 [facility]  currently due to no insurance   per cardiac cath 02-06-2015 (positive mild lateral ishcemia on stress test)--- dLAD 80%,  mLAD 40%,  mPDA 80% (small vessel),  ostial D1 70%,  CFx with lumial irregarlities-- medical management  . Depression   . Diabetic neuropathy ([place])   . Diabetic retinopathy ([place])    NPDR OU  . Headache   . History of Bell's palsy 07/2011   per pt residual facial pain on left side occasionally  . History of cancer of vagina 1999   per pt completed radiation and chemo  . History of sepsis 12/30/2017   positive blood culter fro E.coli  . Hyperlipidemia   . Hypertension   . Hypertensive retinopathy    OU  . Hypokalemia   . Insulin dependent type 2 diabetes mellitus, uncontrolled ([place])    followed by pcp---  A1c was 11.7 on 06-15-2018 in epic  . Myocardial infarction ([place])   . Nocturia   . Peripheral neuropathy   . PMB (postmenopausal bleeding)   . Wears dentures    upper  . Wears glasses    Past Surgical History:  Procedure Laterality Date  . BREAST BIOPSY  2012   benign  . CARDIAC CATHETERIZATION N/A 02/06/2015   Procedure: Left Heart Cath and Coronary Angiography;  Surgeon: Larey Dresser, MD;  Location: [facility];  Service: Cardiovascular;  Laterality: N/A;  . CATARACT EXTRACTION Bilateral OD: 03/07/19, OS: 02/28/19   Dr. Gershon Crane  . CATARACT EXTRACTION, BILATERAL    . EYE SURGERY Bilateral OD: 03/07/19, OS: 02/28/19   Cat Sx - Dr. Gershon Crane  . HYSTEROSCOPY WITH D & C N/A 08/01/2018   Procedure: DILATATION AND CURETTAGE  /HYSTEROSCOPY;  Surgeon: Mora Bellman, MD;  [facility];  Service: Gynecology;  Laterality: N/A;  . ROBOTIC ASSISTED TOTAL HYSTERECTOMY WITH BILATERAL SALPINGO OOPHERECTOMY N/A 11/28/2018   Procedure: XI ROBOTIC ASSISTED TOTAL HYSTERECTOMY WITH BILATERAL SALPINGO OOPHORECTOMY;  Surgeon: Everitt Amber, MD;  Location: [facility];  Service: Gynecology;  Laterality: N/A;  . SENTINEL NODE BIOPSY N/A 11/28/2018   Procedure: SENTINEL NODE BIOPSY;  Surgeon: Everitt Amber, MD;  Location: [facility];  Service: Gynecology;  Laterality: N/A;  . UMBILICAL HERNIA REPAIR  child    FAMILY HISTORY Family History  Problem Relation Age  of Onset  . Heart disease Mother   . Hypertension Mother   . Diabetes Mother   . Cancer Father        hodgkins lymphoma  . Heart disease Sister        heart attack  . Diabetes Sister   . Cancer Other        parent  . Diabetes Other        parent  . Heart disease Other        parent  . Hyperlipidemia Other        parent  . Hypertension Other        parent  . Arthritis Other        parent  . Diabetes Maternal Aunt   . Diabetes Maternal Uncle   . Breast cancer Neg Hx     SOCIAL HISTORY Social History   Tobacco Use  . Smoking status: Never Smoker  . Smokeless tobacco: Never Used  Vaping Use  . Vaping Use: Never used  Substance Use Topics  . Alcohol use: Not Currently  . Drug use: Not Currently    Types: Marijuana    Comment: 07-28-2018  per pt last smoked 11/21/2018         OPHTHALMIC EXAM:  Base Eye Exam    Visual Acuity (Snellen - Linear)      Right Left   Dist  20/20 20/400   Dist ph cc  NI       Tonometry (Tonopen, 1:32 PM)      Right Left   Pressure 22 17       Pupils      Dark Light Shape React APD   Right 3 2 Round Brisk None   Left 3 2 Round Brisk None       Visual Fields (Counting fingers)      Left Right    Full Full       Extraocular Movement      Right Left    Full, Ortho Full, Ortho        Neuro/Psych    Oriented x3: Yes   Mood/Affect: Normal       Dilation    Both eyes: 1.0% Mydriacyl, 2.5% Phenylephrine @ 1:33 PM        Slit Lamp and Fundus Exam    Slit Lamp Exam      Right Left   Lids/Lashes Dermatochalasis - upper lid Dermatochalasis - upper lid   Conjunctiva/Sclera Mild Melanosis Mild Melanosis   Cornea 1+ Punctate epithelial erosions 1+ Punctate epithelial erosions, well healed temporal cataract wounds   Anterior Chamber Deep, 0.5+cell/pigment Deep, 0.5+cell/pigment   Iris Round and dilated, No NVI Round and dilated, No NVI   Lens PC IOL in good position with trace PCO PC IOL in good position with 1+PCO   Vitreous Vitreous syneresis Vitreous syneresis, Posterior vitreous detachment, vitreous condensations       Fundus Exam      Right Left   Disc Pallor, Sharp rim Compact, Pink and Sharp, temporal PPA   C/D Ratio 0.1 0.1   Macula blunted foveal reflex, +central edema - persistent, scattered Microaneurysms, focal exudates temporally -- improved Blunted foveal reflex, central edema - improved, +exudates/MA/IRH   Vessels attenuated, Tortuous attenuated, Tortuous   Periphery Attached, scattered drusen, scattered DBH  Attached, scattered DBH greatest posteriorly           IMAGING AND PROCEDURES  Imaging and Procedures for _0 @  OCT, Retina - OU - Both Eyes  Right Eye Quality was good. Central Foveal Thickness: 354. Progression has worsened. Findings include abnormal foveal contour, intraretinal fluid, no SRF, intraretinal hyper-reflective material, vitreomacular adhesion  (persistent IRF inferior macula, no significant improvement).   Left Eye Quality was good. Central Foveal Thickness: 383. Progression has improved. Findings include abnormal foveal contour, intraretinal fluid, intraretinal hyper-reflective material, vitreomacular adhesion , outer retinal atrophy, subretinal hyper-reflective material, no SRF (Mild Interval improvement in IRF  temporal macula).   Notes *Images captured and stored on drive  Diagnosis / Impression:  Macular edema OU - likely combination of CME + DME OU OD: persistent IRF inferior macula, no significant improvement OS: Mild Interval improvement in IRF temporal macula  Clinical management:  See below  Abbreviations: NFP - Normal foveal profile. CME - cystoid macular edema. PED - pigment epithelial detachment. IRF - intraretinal fluid. SRF - subretinal fluid. EZ - ellipsoid zone. ERM - epiretinal membrane. ORA - outer retinal atrophy. ORT - outer retinal tubulation. SRHM - subretinal hyper-reflective material        Intravitreal Injection, Pharmacologic Agent - OD - Right Eye       Time Out 10/22/2020. 2:03 PM. Confirmed correct patient, procedure, site, and patient consented.   Anesthesia Topical anesthesia was used. Anesthetic medications included Lidocaine 2%, Proparacaine 0.5%.   Procedure Preparation included 5% betadine to ocular surface, eyelid speculum. A (32g) needle was used.   Injection:  2 mg aflibercept Alfonse Flavors) SOLN   NDC: A3590391, Lot: [id number], Expiration date: 07/28/2021   Route: Intravitreal, Site: Right Eye, Waste: 0.05 mL  Post-op Post injection exam found visual acuity of at least counting fingers. The patient tolerated the procedure well. There were no complications. The patient received written and verbal post procedure care education. Post injection medications were not given.        Intravitreal Injection, Pharmacologic Agent - OS - Left Eye       Time Out 10/22/2020. 2:03 PM. Confirmed correct patient, procedure, site, and patient consented.   Anesthesia Topical anesthesia was used. Anesthetic medications included Lidocaine 2%, Proparacaine 0.5%.   Procedure Preparation included 5% betadine to ocular surface, eyelid speculum. A (32g) needle was used.   Injection:  2 mg aflibercept Alfonse Flavors) SOLN   NDC: M7179715, Lot: [id number], Expiration  date: 05/30/2021   Route: Intravitreal, Site: Left Eye, Waste: 0.05 mL  Post-op Post injection exam found visual acuity of at least counting fingers. The patient tolerated the procedure well. There were no complications. The patient received written and verbal post procedure care education. Post injection medications were not given.                 ASSESSMENT/PLAN:    ICD-10-CM   1. Moderate nonproliferative diabetic retinopathy of both eyes with macular edema associated with type 2 diabetes mellitus (HCC)  O03.2122 Intravitreal Injection, Pharmacologic Agent - OD - Right Eye    Intravitreal Injection, Pharmacologic Agent - OS - Left Eye    aflibercept (EYLEA) SOLN 2 mg    aflibercept (EYLEA) SOLN 2 mg  2. CME (cystoid macular edema), bilateral  H35.353   3. Retinal edema  H35.81 OCT, Retina - OU - Both Eyes  4. Essential hypertension  I10   5. Hypertensive retinopathy of both eyes  H35.033   6. Pseudophakia of both eyes  Z96.1     1-3. Moderate Non-proliferative diabetic retinopathy, OU OD with combination DME/CME  **delayed to follow up from September 2021--7 mos instead of 4 wks**  - exam shows scattered [place],  edema and tortuous blood vessels OU  - FA (10.23.20) shows leaking MA OU and paracentral petaloid hyperfluorecence OD --> CME/[place]  - OCT shows diabetic macular edema, OU; OD with CME/[place] component contributing as well  - s/p IVA OS #1 (10.23.20), #2 (11.18.20), #3 (12.18.20), #4 (02.24.21), #5 (03.24.21)  - s/p IVA OD #1 (11.06.20), #2 (12.18.20), #3 (02.24.21), #4 (03.24.21), #5 (04.21.21), #6 (5.26.21), #7 (07.26.21), #8 (08.25.21), #9 (09.23.21), #10 (04.26.22)  - s/p IVE OS #1 (04.21.21), # 2 (5.26.21), #3 (07.26.21), #4 (08.25.21), #5 (09.23.21), #6 (04.26.22)  - BCVA OD 20/20; OS 20/400  - OCT today shows OD: persistent IRF inferior macula, no significant improvement; OS: Mild Interval improvement in IRF temporal macula  - discussed IVA resistance  OD now too  - recommend IVE OD #1 and IVE #7 OS 05.25.22  - pt wishes to proceed  - RBA of procedure discussed, questions answered  - informed consent obtained  - [place] informed consent form signed and scanned on 12.18.2020  - Eylea OS informed consent form signed and scanned on 4.21.2021  - Eylea OD informed consent form signed and scanned on 05.25.22  - see procedure note  - Eylea4U benefits investigation started, 04.21.21  - cont Pred Forte and Ketorolac QID OU  - f/u in 4 weeks, DFE, OCT, possible injection OU  4,5. Hypertensive retinopathy OU  - discussed importance of tight BP control  - monitor  6. Pseudophakia OU  - s/p CE/IOL OU Gershon Crane)   OD: 03/07/2019   OS: 02/28/2019  - IOLs in good position  - CME OU as above  - monitor  Ophthalmic Meds Ordered this visit:  Meds ordered this encounter  Medications  . aflibercept (EYLEA) SOLN 2 mg  . aflibercept (EYLEA) SOLN 2 mg       Return in about 4 weeks (around 11/19/2020) for f/u NPDR OU, DFE, OCT.  There are no Patient Instructions on file for this visit.   This document serves as a record of services personally performed by Gardiner Sleeper, MD, PhD. It was created on their behalf by Roselee Nova, COMT. The creation of this record is the provider's dictation and/or activities during the visit.  Electronically signed by: Roselee Nova, COMT 10/23/20 10:44 PM   Gardiner Sleeper, M.D., Ph.D. Diseases & Surgery of the Retina and Vitreous [facility]  I have reviewed the above documentation for accuracy and completeness, and I agree with the above. Gardiner Sleeper, M.D., Ph.D. 10/23/20 10:44 PM   Abbreviations: M myopia (nearsighted); A astigmatism; H hyperopia (farsighted); P presbyopia; Mrx spectacle prescription;  CTL contact lenses; OD right eye; OS left eye; OU both eyes  XT exotropia; ET esotropia; PEK punctate epithelial keratitis; PEE punctate epithelial erosions; DES dry eye syndrome;  MGD meibomian gland dysfunction; ATs artificial tears; PFAT's preservative free artificial tears; [place] nuclear sclerotic cataract; PSC posterior subcapsular cataract; ERM epi-retinal membrane; PVD posterior vitreous detachment; RD retinal detachment; DM diabetes mellitus; DR diabetic retinopathy; NPDR non-proliferative diabetic retinopathy; PDR proliferative diabetic retinopathy; CSME clinically significant macular edema; DME diabetic macular edema; dbh dot blot hemorrhages; CWS cotton wool spot; POAG primary open angle glaucoma; C/D cup-to-disc ratio; HVF humphrey visual field; GVF goldmann visual field; OCT optical coherence tomography; IOP intraocular pressure; BRVO Branch retinal vein occlusion; CRVO central retinal vein occlusion; CRAO central retinal artery occlusion; BRAO branch retinal artery occlusion; RT retinal tear; SB scleral buckle; PPV pars plana vitrectomy; VH Vitreous hemorrhage; PRP panretinal laser photocoagulation;  IVK intravitreal kenalog; VMT vitreomacular traction; MH Macular hole;  NVD neovascularization of the disc; NVE neovascularization elsewhere; AREDS age related eye disease study; ARMD age related macular degeneration; POAG primary open angle glaucoma; EBMD epithelial/anterior basement membrane dystrophy; ACIOL anterior chamber intraocular lens; IOL intraocular lens; PCIOL posterior chamber intraocular lens; Phaco/IOL phacoemulsification with intraocular lens placement; [place] photorefractive keratectomy; LASIK laser assisted in situ keratomileusis; HTN hypertension; DM diabetes mellitus; COPD chronic obstructive pulmonary disease

## 2020-10-22 ENCOUNTER — Other Ambulatory Visit: Payer: Self-pay

## 2020-10-22 ENCOUNTER — Ambulatory Visit (INDEPENDENT_AMBULATORY_CARE_PROVIDER_SITE_OTHER): Payer: Medicare Other | Admitting: Ophthalmology

## 2020-10-22 ENCOUNTER — Encounter (INDEPENDENT_AMBULATORY_CARE_PROVIDER_SITE_OTHER): Payer: Self-pay | Admitting: Ophthalmology

## 2020-10-22 DIAGNOSIS — H35353 Cystoid macular degeneration, bilateral: Secondary | ICD-10-CM

## 2020-10-22 DIAGNOSIS — H3581 Retinal edema: Secondary | ICD-10-CM

## 2020-10-22 DIAGNOSIS — I10 Essential (primary) hypertension: Secondary | ICD-10-CM | POA: Diagnosis not present

## 2020-10-22 DIAGNOSIS — H35033 Hypertensive retinopathy, bilateral: Secondary | ICD-10-CM

## 2020-10-22 DIAGNOSIS — Z961 Presence of intraocular lens: Secondary | ICD-10-CM

## 2020-10-22 DIAGNOSIS — E113313 Type 2 diabetes mellitus with moderate nonproliferative diabetic retinopathy with macular edema, bilateral: Secondary | ICD-10-CM | POA: Diagnosis not present

## 2020-10-23 ENCOUNTER — Encounter (INDEPENDENT_AMBULATORY_CARE_PROVIDER_SITE_OTHER): Payer: Self-pay | Admitting: Ophthalmology

## 2020-10-23 DIAGNOSIS — E113313 Type 2 diabetes mellitus with moderate nonproliferative diabetic retinopathy with macular edema, bilateral: Secondary | ICD-10-CM | POA: Diagnosis not present

## 2020-10-23 MED ORDER — AFLIBERCEPT 2MG/0.05ML IZ SOLN FOR KALEIDOSCOPE
2.0000 mg | INTRAVITREAL | Status: AC | PRN
  Administered 2020-10-23: 2 mg via INTRAVITREAL

## 2020-11-04 ENCOUNTER — Other Ambulatory Visit: Payer: Self-pay

## 2020-11-04 ENCOUNTER — Other Ambulatory Visit: Payer: Self-pay | Admitting: Internal Medicine

## 2020-11-04 DIAGNOSIS — I10 Essential (primary) hypertension: Secondary | ICD-10-CM

## 2020-11-04 DIAGNOSIS — Z794 Long term (current) use of insulin: Secondary | ICD-10-CM

## 2020-11-04 DIAGNOSIS — I2510 Atherosclerotic heart disease of native coronary artery without angina pectoris: Secondary | ICD-10-CM

## 2020-11-04 DIAGNOSIS — E1165 Type 2 diabetes mellitus with hyperglycemia: Secondary | ICD-10-CM

## 2020-11-04 MED ORDER — CARVEDILOL 25 MG PO TABS
ORAL_TABLET | Freq: Two times a day (BID) | ORAL | 0 refills | Status: DC
  Filled 2020-11-04: qty 60, 30d supply, fill #0

## 2020-11-04 MED ORDER — EMPAGLIFLOZIN 10 MG PO TABS
ORAL_TABLET | Freq: Every day | ORAL | 4 refills | Status: DC
  Filled 2020-11-04: qty 30, 30d supply, fill #0
  Filled 2020-12-29: qty 30, 30d supply, fill #1
  Filled 2021-02-06: qty 30, 30d supply, fill #2
  Filled 2021-03-02: qty 30, 30d supply, fill #3
  Filled 2021-04-10: qty 30, 30d supply, fill #4

## 2020-11-09 IMAGING — CT CT ABDOMEN AND PELVIS WITH CONTRAST
2 of 5 series · 15 of 46 positions shown, 17 images · IV contrast (APPLIED)
Comparison: MR abdomen, 01/18/2018

CLINICAL DATA: New diagnosis endometrial cancer, evaluate for
metastatic

EXAM:
CT CHEST, ABDOMEN, AND PELVIS WITH CONTRAST
TECHNIQUE: Multidetector CT imaging of the chest, abdomen and pelvis was
performed following the standard protocol during bolus
administration of intravenous contrast.
CONTRAST:  100mL OMNIPAQUE IOHEXOL 300 MG/ML SOLN, additional oral
enteric contrast

[Series 2: cap with · axial · 0.72mm/px · z∈[+1067,+1582]mm · 12 of 123 slices shown, 14 images]
[im 10/123  soft-tissue]
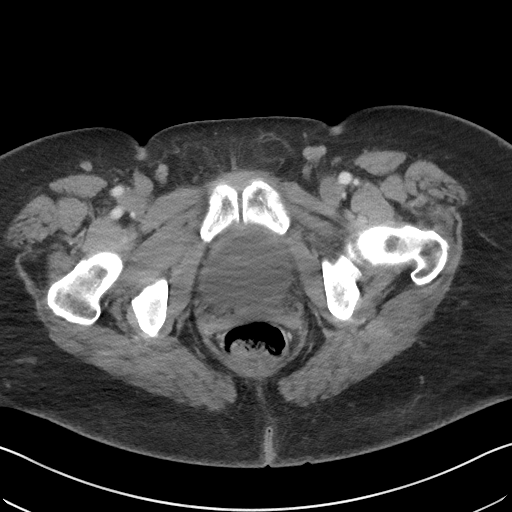
[im 10/123  bone]
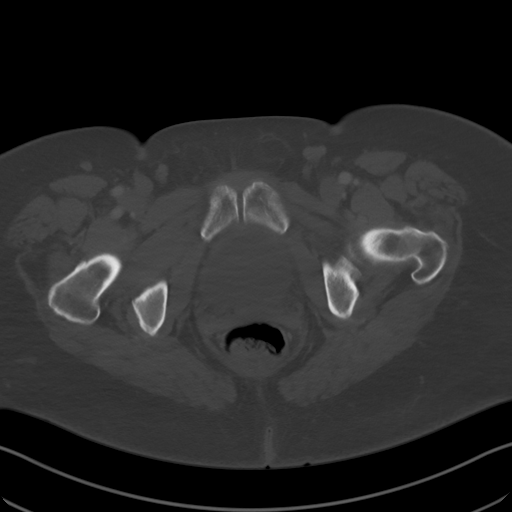
[im 19/123  soft-tissue]
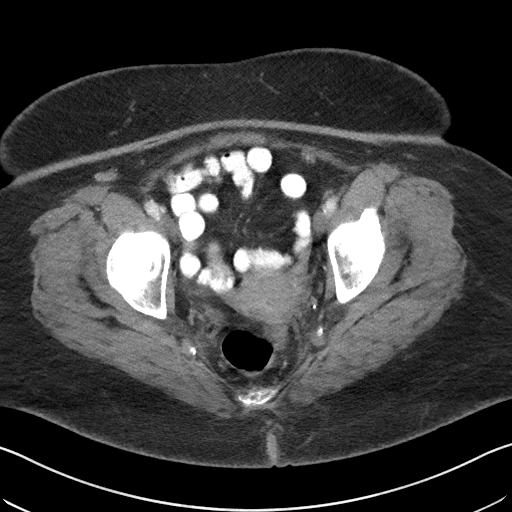
[im 29/123  soft-tissue]
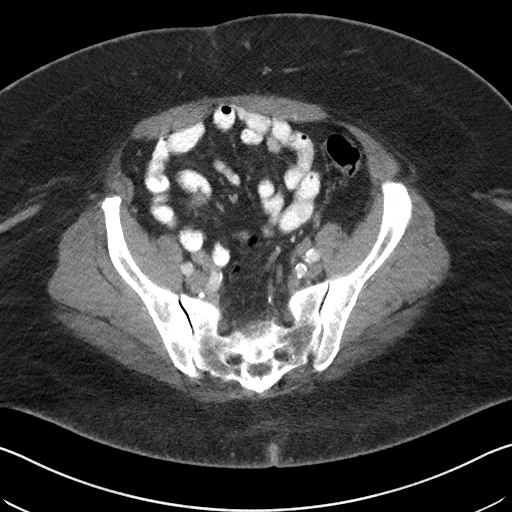
[im 38/123  soft-tissue]
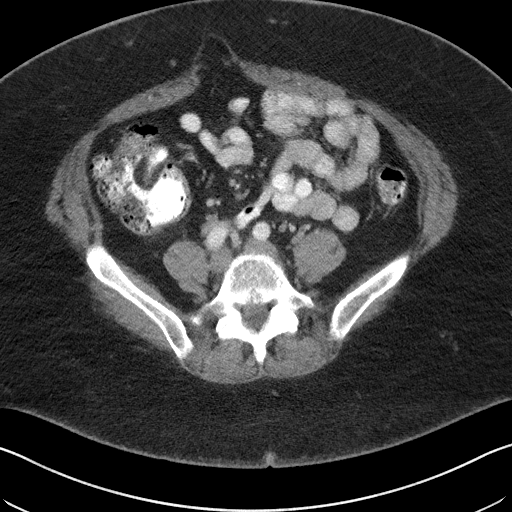
[im 47/123  soft-tissue]
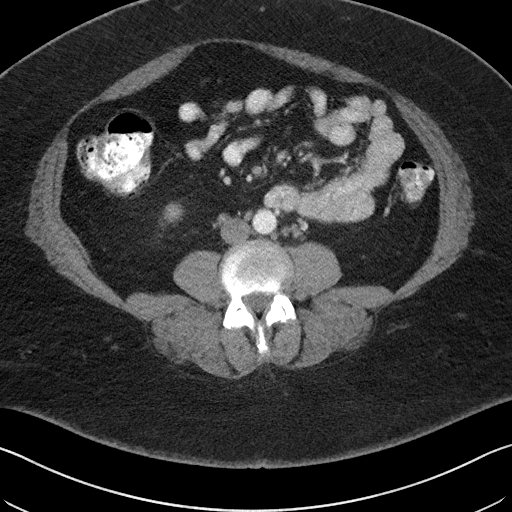
[im 57/123  soft-tissue]
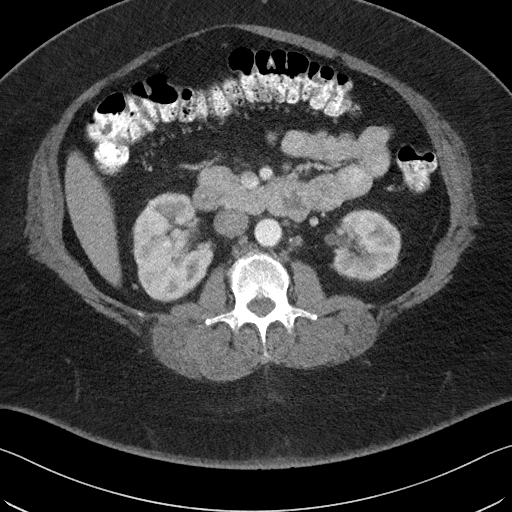
[im 66/123  soft-tissue]
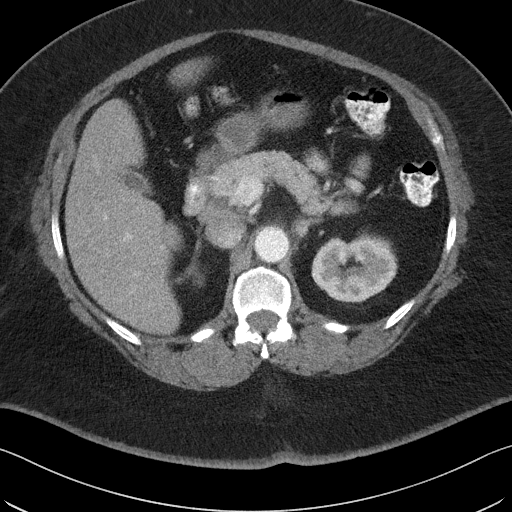
[im 76/123  soft-tissue]
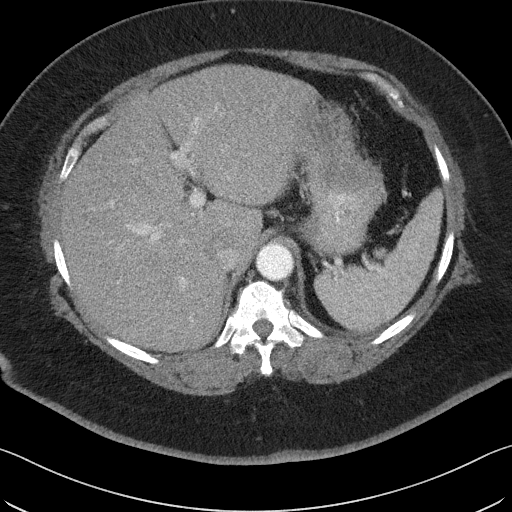
[im 85/123  soft-tissue]
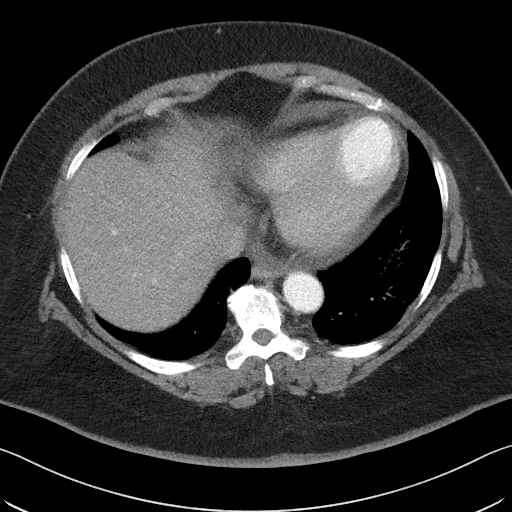
[im 85/123  bone]
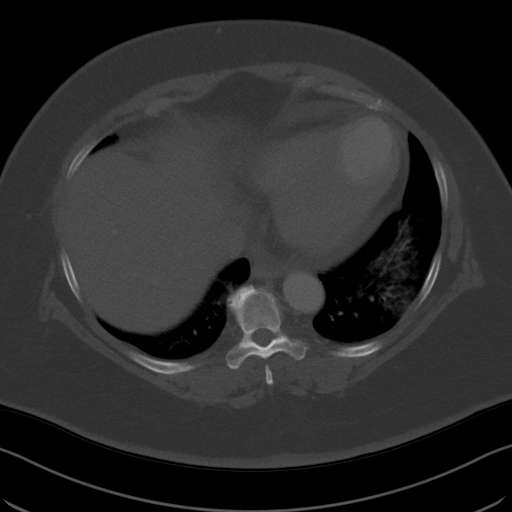
[im 94/123  soft-tissue]
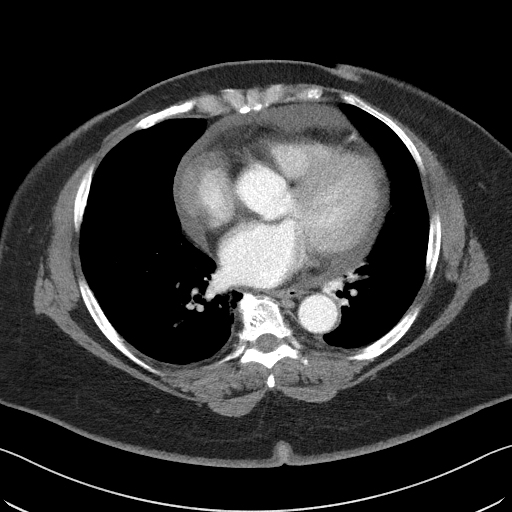
[im 104/123  soft-tissue]
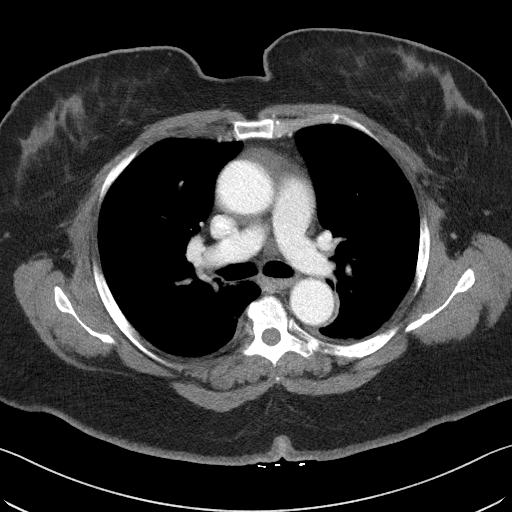
[im 113/123  soft-tissue]
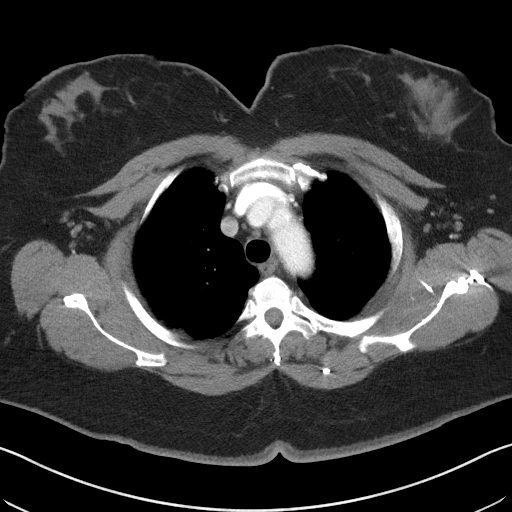

[Series 5: coronals · coronal · 0.94mm/px · 3 of 163 slices shown]
[im 55/163  soft-tissue]
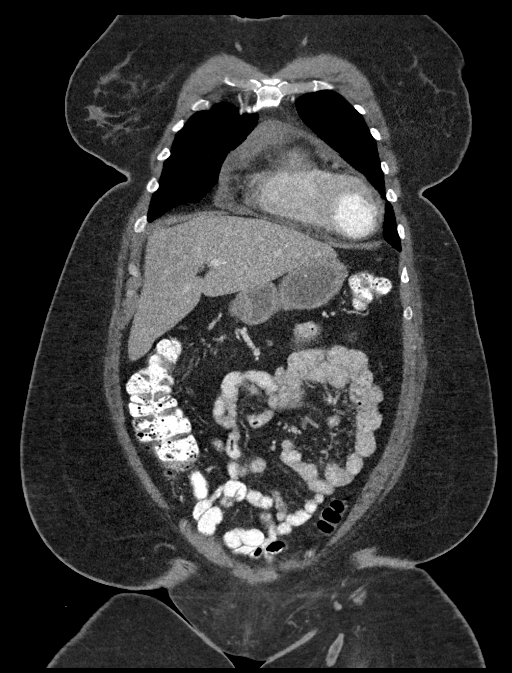
[im 73/163  soft-tissue]
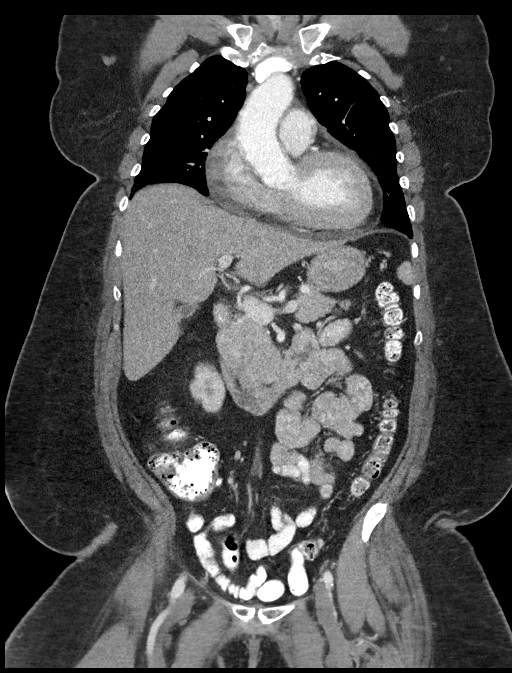
[im 91/163  soft-tissue]
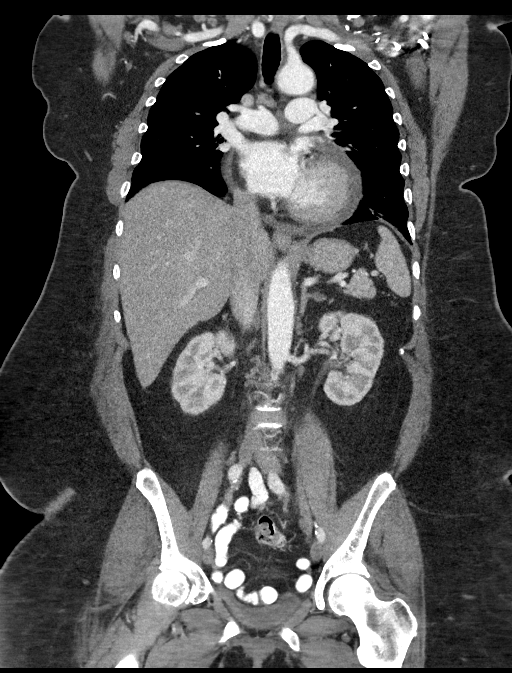

[15 of 46 positions shown; findings below may reference images not displayed]

FINDINGS: CT CHEST FINDINGS

Cardiovascular: No significant vascular findings. Normal heart size.
Moderate pericardial effusion.

Mediastinum/Nodes: No enlarged mediastinal, hilar, or axillary lymph
nodes. Thyroid gland, trachea, and esophagus demonstrate no
significant findings.

Lungs/Pleura: There is mild bibasilar predominant and dependent
subpleural ground-glass and irregular interstitial opacity (e.g.
Series 4, image 70). No pleural effusion or pneumothorax.

Musculoskeletal: No chest wall mass or suspicious bone lesions
identified.

CT ABDOMEN PELVIS FINDINGS

Hepatobiliary: No focal liver abnormality is seen. No gallstones,
gallbladder wall thickening, or biliary dilatation.

Pancreas: Unremarkable. No pancreatic ductal dilatation or
surrounding inflammatory changes.

Spleen: Normal in size without focal abnormality. Small accessory
splenule.

Adrenals/Urinary Tract: There is a 2.0 cm nodule of the lateral limb
of the left adrenal gland, previously characterized as a benign
adenoma on MR kidneys are normal, without renal calculi, focal
lesion, or hydronephrosis. Bladder is unremarkable.

Stomach/Bowel: Stomach is within normal limits. Appendix appears
normal. No evidence of bowel wall thickening, distention, or
inflammatory changes.

Vascular/Lymphatic: No significant vascular findings are present. No
enlarged abdominal or pelvic lymph nodes.

Reproductive: No mass or other abnormality.

Other: No abdominal wall hernia or abnormality. No abdominopelvic
ascites.

Musculoskeletal: No acute or significant osseous findings.
IMPRESSION: 1. No evidence of metastatic disease in the chest, abdomen, or
pelvis. No abdominal or pelvic lymphadenopathy.
2. Mild bibasilar predominant and dependent subpleural ground-glass
and irregular interstitial opacities, which could reflect
interstitial lung disease. Recommend follow-up ILD protocol chest CT
in 6-12 months for further evaluation.
3. Moderate pericardial effusion, of uncertain etiology.

## 2020-11-10 ENCOUNTER — Other Ambulatory Visit: Payer: Self-pay

## 2020-11-11 ENCOUNTER — Other Ambulatory Visit: Payer: Self-pay

## 2020-11-17 ENCOUNTER — Other Ambulatory Visit: Payer: Self-pay

## 2020-11-17 ENCOUNTER — Ambulatory Visit
Admission: RE | Admit: 2020-11-17 | Discharge: 2020-11-17 | Disposition: A | Discharge status: Home / Self Care | Payer: Medicare Other | Source: Ambulatory Visit | Attending: Internal Medicine | Admitting: Internal Medicine

## 2020-11-17 DIAGNOSIS — Z1231 Encounter for screening mammogram for malignant neoplasm of breast: Secondary | ICD-10-CM

## 2020-11-17 MED FILL — Insulin Glargine Soln Pen-Injector 100 Unit/ML: SUBCUTANEOUS | 77 days supply | Qty: 45 | Fill #2 | Status: AC

## 2020-11-18 ENCOUNTER — Other Ambulatory Visit: Payer: Self-pay

## 2020-11-19 ENCOUNTER — Other Ambulatory Visit: Payer: Self-pay

## 2020-11-19 ENCOUNTER — Encounter (INDEPENDENT_AMBULATORY_CARE_PROVIDER_SITE_OTHER): Payer: Medicare Other | Admitting: Ophthalmology

## 2020-11-19 ENCOUNTER — Ambulatory Visit (AMBULATORY_SURGERY_CENTER): Payer: Medicare Other | Admitting: *Deleted

## 2020-11-19 VITALS — Ht 64.0 in | Wt 286.0 lb

## 2020-11-19 DIAGNOSIS — H35353 Cystoid macular degeneration, bilateral: Secondary | ICD-10-CM

## 2020-11-19 DIAGNOSIS — H3581 Retinal edema: Secondary | ICD-10-CM

## 2020-11-19 DIAGNOSIS — I10 Essential (primary) hypertension: Secondary | ICD-10-CM

## 2020-11-19 DIAGNOSIS — E113313 Type 2 diabetes mellitus with moderate nonproliferative diabetic retinopathy with macular edema, bilateral: Secondary | ICD-10-CM

## 2020-11-19 DIAGNOSIS — Z800 Family history of malignant neoplasm of digestive organs: Secondary | ICD-10-CM

## 2020-11-19 DIAGNOSIS — Z961 Presence of intraocular lens: Secondary | ICD-10-CM

## 2020-11-19 DIAGNOSIS — Z1211 Encounter for screening for malignant neoplasm of colon: Secondary | ICD-10-CM

## 2020-11-19 DIAGNOSIS — H35033 Hypertensive retinopathy, bilateral: Secondary | ICD-10-CM

## 2020-11-19 MED ORDER — NA SULFATE-K SULFATE-MG SULF 17.5-3.13-1.6 GM/177ML PO SOLN
ORAL | 0 refills | Status: DC
  Filled 2020-11-19: qty 354, 30d supply, fill #0

## 2020-11-19 NOTE — Progress Notes (Signed)
Patient is here in-person for PV. Patient denies any allergies to eggs or soy. Patient denies any problems with anesthesia/sedation. Patient denies any oxygen use at home. Patient denies taking any diet/weight loss medications or blood thinners. Patient is not being treated for MRSA or C-diff. Patient is aware of our care-partner policy and Covid-19 safety protocol. EMMI education assigned to the patient for the procedure, sent to MyChart.   Patient is COVID-19 vaccinated, per patient.   

## 2020-11-20 ENCOUNTER — Telehealth: Payer: Self-pay

## 2020-11-20 ENCOUNTER — Other Ambulatory Visit: Payer: Self-pay

## 2020-11-20 NOTE — Telephone Encounter (Signed)
Contacted pt to go over MM results pt is aware and doesn't have any questions or concerns

## 2020-11-26 NOTE — Progress Notes (Signed)
[facility] Note  11/28/2020     CHIEF COMPLAINT Patient presents for Retina Follow Up   HISTORY OF PRESENT ILLNESS: Kimberly Frazier is a 60 y.o. female who presents to the clinic today for:   HPI     Retina Follow Up   Patient presents with  Diabetic Retinopathy.  In both eyes.  This started weeks ago.  Severity is moderate.  Duration of weeks.  Since onset it is stable.  I, the attending physician,  performed the HPI with the patient and updated documentation appropriately.        Comments   Pt states vision is the same OU.  Pt denies eye pain or discomfort and denies any new or worsening floaters or fol OU.      Last edited by Bernarda Caffey, MD on 11/28/2020  5:33 PM.     pt states no changes in vision or health, blood pressure and blood sugar are doing well, she is using PF and Ketorolac QID OU   Referring physician: Ladell Pier, MD [place],  [place] [address]  HISTORICAL INFORMATION:   Selected notes from the MEDICAL RECORD NUMBER Referred by Dr. Rutherford Guys for concern of CME   CURRENT MEDICATIONS: Current Outpatient Medications (Ophthalmic Drugs)  Medication Sig   Bromfenac Sodium (PROLENSA) 0.07 % SOLN Place 1 drop into both eyes 4 (four) times daily.   ketorolac (ACULAR) 0.5 % ophthalmic solution Place 1 drop into both eyes 4 (four) times daily.   prednisoLONE acetate (PRED FORTE) 1 % ophthalmic suspension Place 1 drop into both eyes 4 (four) times daily.   No current facility-administered medications for this visit. (Ophthalmic Drugs)   Current Outpatient Medications (Other)  Medication Sig   Accu-Chek Softclix Lancets lancets Use to check blood sugar 3 times daily.   amLODipine (NORVASC) 10 MG tablet TAKE 1 TABLET (10 MG TOTAL) BY MOUTH DAILY.   aspirin EC 81 MG tablet Take 1 tablet (81 mg total) daily by mouth.   atorvastatin (LIPITOR) 80 MG tablet Take 1 tablet (80 mg total) by mouth daily.   Blood  Glucose Monitoring Suppl (ACCU-CHEK GUIDE) w/Device KIT 1 kit by Does not apply route in the morning, at noon, and at bedtime. Use to check blood sugar 3 times daily.   butalbital-acetaminophen-caffeine (FIORICET) 50-325-40 MG tablet Take 1 tablet by mouth every 6 (six) hours as needed for headache. (Patient not taking: Reported on 11/19/2020)   carvedilol (COREG) 25 MG tablet TAKE 1 TABLET (25 MG TOTAL) BY MOUTH 2 (TWO) TIMES DAILY WITH A MEAL.   empagliflozin (JARDIANCE) 10 MG TABS tablet TAKE 1 TABLET (10 MG TOTAL) BY MOUTH DAILY.   glucose blood (ACCU-CHEK GUIDE) test strip Use to check blood sugar 3 times daily.   insulin aspart (NOVOLOG) 100 UNIT/ML injection Inject 28 Units into the skin 3 (three) times daily before meals.   insulin glargine (LANTUS) 100 UNIT/ML Solostar Pen INJECT 58 UNITS INTO THE SKIN DAILY.   Insulin Syringe-Needle U-100 (BD INSULIN SYRINGE) 29G X 1/2" 0.3 ML MISC Use to inject Novolog TID.   Insulin Syringe-Needle U-100 31G X 15/64" 0.3 ML MISC USE TO INJECT NOVOLOG THREE TIMES DAILY   isosorbide mononitrate (IMDUR) 60 MG 24 hr tablet TAKE 1 TABLET (60 MG TOTAL) BY MOUTH DAILY.   Na Sulfate-K Sulfate-Mg Sulf 17.5-3.13-1.6 GM/177ML SOLN Suprep (no substitutions)-TAKE AS DIRECTED.   nitroGLYCERIN (NITROSTAT) 0.4 MG SL tablet Place 1 tab under tongue for  chest pain.  May repeat after 5 minutes x 2.  DO NOT TAKE MORE THAN 3 TABS DURING AN EPISODE OF CHEST PAIN (Patient not taking: Reported on 11/19/2020)   oxybutynin (DITROPAN-XL) 5 MG 24 hr tablet Take 1 tablet (5 mg total) by mouth at bedtime.   potassium chloride (KLOR-CON) 10 MEQ tablet TAKE 1 TABLET BY MOUTH EVERY DAY   TRUEPLUS PEN NEEDLES 32G X 4 MM MISC USE AS DIRECTED WITH INSULIN   No current facility-administered medications for this visit. (Other)      REVIEW OF SYSTEMS: ROS   Positive for: Neurological, Endocrine, Cardiovascular, Eyes Negative for: Constitutional, Gastrointestinal, Skin, Genitourinary,  Musculoskeletal, HENT, Respiratory, Psychiatric, Allergic/Imm, Heme/Lymph Last edited by Doneen Poisson on 11/28/2020 12:59 PM.        ALLERGIES No Known Allergies  PAST MEDICAL HISTORY Past Medical History:  Diagnosis Date   Adrenal adenoma, left    Arthritis    Cancer ([place])    CKD (chronic kidney disease), stage II    Coronary artery disease 07-28-2018 [facility]  currently due to no insurance   per cardiac cath 02-06-2015 (positive mild lateral ishcemia on stress test)--- dLAD 80%,  mLAD 40%,  mPDA 80% (small vessel),  ostial D1 70%,  CFx with lumial irregarlities-- medical management   Depression    Diabetic neuropathy ([place])    Diabetic retinopathy ([place])    NPDR OU   Headache    History of Bell's palsy 07/2011   per pt residual facial pain on left side occasionally   History of cancer of vagina 1999   per pt completed radiation and chemo   History of sepsis 12/30/2017   positive blood culter fro E.coli   Hyperlipidemia    Hypertension    Hypertensive retinopathy    OU   Hypokalemia    Insulin dependent type 2 diabetes mellitus, uncontrolled ([place])    followed by pcp---  A1c was 11.7 on 06-15-2018 in epic   Myocardial infarction [facility])    10 yrs ago?   Nocturia    Peripheral neuropathy    PMB (postmenopausal bleeding)    Wears dentures    upper   Wears glasses    Past Surgical History:  Procedure Laterality Date   BREAST BIOPSY  2012   benign   CARDIAC CATHETERIZATION N/A 02/06/2015   Procedure: Left Heart Cath and Coronary Angiography;  Surgeon: Larey Dresser, MD;  Location: [facility];  Service: Cardiovascular;  Laterality: N/A;   CATARACT EXTRACTION Bilateral OD: 03/07/19, OS: 02/28/19   Dr. Gershon Crane   CATARACT EXTRACTION, BILATERAL     COLONOSCOPY     normal exam in [place], [place] 10-12 years ago   EYE SURGERY Bilateral OD: 03/07/19, OS: 02/28/19   Cat Sx - Dr. Gershon Crane   HYSTEROSCOPY WITH D & C N/A 08/01/2018    Procedure: DILATATION AND CURETTAGE /HYSTEROSCOPY;  Surgeon: Mora Bellman, MD;  Location: [facility];  Service: Gynecology;  Laterality: N/A;   ROBOTIC ASSISTED TOTAL HYSTERECTOMY WITH BILATERAL SALPINGO OOPHERECTOMY N/A 11/28/2018   Procedure: XI ROBOTIC ASSISTED TOTAL HYSTERECTOMY WITH BILATERAL SALPINGO OOPHORECTOMY;  Surgeon: Everitt Amber, MD;  Location: [facility];  Service: Gynecology;  Laterality: N/A;   SENTINEL NODE BIOPSY N/A 11/28/2018   Procedure: SENTINEL NODE BIOPSY;  Surgeon: Everitt Amber, MD;  Location: [facility];  Service: Gynecology;  Laterality: N/A;   UMBILICAL HERNIA REPAIR  child    FAMILY HISTORY Family History  Problem Relation  Age of Onset   Heart disease Mother    Hypertension Mother    Diabetes Mother    Cancer Father        hodgkins lymphoma   Heart disease Sister        heart attack   Diabetes Sister    Colon cancer Brother 66   Diabetes Maternal Aunt    Diabetes Maternal Uncle    Cancer Other        parent   Diabetes Other        parent   Heart disease Other        parent   Hyperlipidemia Other        parent   Hypertension Other        parent   Arthritis Other        parent   Breast cancer Neg Hx    Colon polyps Neg Hx    Esophageal cancer Neg Hx    Rectal cancer Neg Hx    Stomach cancer Neg Hx     SOCIAL HISTORY Social History   Tobacco Use   Smoking status: Never   Smokeless tobacco: Never  Vaping Use   Vaping Use: Never used  Substance Use Topics   Alcohol use: Not Currently   Drug use: Yes    Types: Marijuana    Comment: every 2 weeks per pt         OPHTHALMIC EXAM:  Base Eye Exam     Visual Acuity (Snellen - Linear)       Right Left   Dist [place] 20/20 - 20/200 -2   Dist ph [place]  NI         Tonometry (Tonopen, 1:03 PM)       Right Left   Pressure 20 20         Pupils       Dark Light Shape React APD   Right 4 3 Round Brisk 0   Left 4 3 Round Brisk 0         Visual Fields       Left Right     Full Full         Extraocular Movement       Right Left    Full Full         Neuro/Psych     Oriented x3: Yes   Mood/Affect: Normal         Dilation     Both eyes: 1.0% Mydriacyl, 2.5% Phenylephrine @ 1:03 PM           Slit Lamp and Fundus Exam     Slit Lamp Exam       Right Left   Lids/Lashes Dermatochalasis - upper lid Dermatochalasis - upper lid   Conjunctiva/Sclera Mild Melanosis Mild Melanosis   Cornea 1+ Punctate epithelial erosions 1+ Punctate epithelial erosions, well healed temporal cataract wounds   Anterior Chamber Deep, 0.5+cell/pigment Deep, 0.5+cell/pigment   Iris Round and dilated, No NVI Round and dilated, No NVI   Lens PC IOL in good position with trace PCO PC IOL in good position with 1+PCO   Vitreous Vitreous syneresis Vitreous syneresis, Posterior vitreous detachment, vitreous condensations         Fundus Exam       Right Left   Disc Pallor, Sharp rim Compact, Pink and Sharp, temporal PPA   C/D Ratio 0.1 0.1   Macula blunted foveal reflex, +central edema - slightly improved, scattered Microaneurysms, focal exudates temporally --  improved Blunted foveal reflex, central edema - persistent, +exudates/MA/IRH   Vessels attenuated, Tortuous attenuated, Tortuous   Periphery Attached, scattered drusen, scattered DBH  Attached, scattered DBH greatest posteriorly             IMAGING AND PROCEDURES  Imaging and Procedures for _0 @  OCT, Retina - OU - Both Eyes       Right Eye Quality was good. Central Foveal Thickness: 316. Progression has improved. Findings include abnormal foveal contour, intraretinal fluid, no SRF, intraretinal hyper-reflective material, vitreomacular adhesion (Mild interval improvement in IRF inferior macula).   Left Eye Quality was good. Central Foveal Thickness: 387. Progression has been stable. Findings include abnormal foveal contour, intraretinal fluid, intraretinal hyper-reflective material,  vitreomacular adhesion , outer retinal atrophy, subretinal hyper-reflective material, no SRF (Persistent IRF temporal and inferior macula -- minimal improvement).   Notes *Images captured and stored on drive  Diagnosis / Impression:  Macular edema OU - likely combination of CME + DME OU OD: Mild interval improvement in IRF inferior macula OS: Persistent IRF temporal and inferior macula -- minimal improvement  Clinical management:  See below  Abbreviations: NFP - Normal foveal profile. CME - cystoid macular edema. PED - pigment epithelial detachment. IRF - intraretinal fluid. SRF - subretinal fluid. EZ - ellipsoid zone. ERM - epiretinal membrane. ORA - outer retinal atrophy. ORT - outer retinal tubulation. SRHM - subretinal hyper-reflective material      Intravitreal Injection, Pharmacologic Agent - OD - Right Eye       Time Out 11/28/2020. 1:54 PM. Confirmed correct patient, procedure, site, and patient consented.   Anesthesia Topical anesthesia was used. Anesthetic medications included Lidocaine 2%, Proparacaine 0.5%.   Procedure Preparation included 5% betadine to ocular surface, eyelid speculum. A supplied needle was used.   Injection: 2 mg aflibercept 2 MG/0.05ML   Route: Intravitreal, Site: Right Eye   NDC: A3590391, Lot: [id number], Expiration date: 09/27/2021, Waste: 0.05 mL   Post-op Post injection exam found visual acuity of at least counting fingers. The patient tolerated the procedure well. There were no complications. The patient received written and verbal post procedure care education.      Intravitreal Injection, Pharmacologic Agent - OS - Left Eye       Time Out 11/28/2020. 1:55 PM. Confirmed correct patient, procedure, site, and patient consented.   Anesthesia Topical anesthesia was used. Anesthetic medications included Lidocaine 2%, Proparacaine 0.5%.   Procedure Preparation included 5% betadine to ocular surface, eyelid speculum.   Injection: 2  mg aflibercept 2 MG/0.05ML   Route: Intravitreal, Site: Left Eye   NDC: M7179715, Lot: [id number], Expiration date: 05/29/2021, Waste: 0.05 mL   Post-op Post injection exam found visual acuity of at least counting fingers. The patient tolerated the procedure well. There were no complications. The patient received written and verbal post procedure care education.               ASSESSMENT/PLAN:    ICD-10-CM   1. Moderate nonproliferative diabetic retinopathy of both eyes with macular edema associated with type 2 diabetes mellitus (HCC)  Q76.1950 Intravitreal Injection, Pharmacologic Agent - OD - Right Eye    Intravitreal Injection, Pharmacologic Agent - OS - Left Eye    aflibercept (EYLEA) SOLN 2 mg    aflibercept (EYLEA) SOLN 2 mg    2. CME (cystoid macular edema), bilateral  H35.353 Intravitreal Injection, Pharmacologic Agent - OD - Right Eye    Intravitreal Injection, Pharmacologic Agent - OS - Left Eye  aflibercept (EYLEA) SOLN 2 mg    aflibercept (EYLEA) SOLN 2 mg    3. Retinal edema  H35.81 OCT, Retina - OU - Both Eyes    4. Essential hypertension  I10     5. Hypertensive retinopathy of both eyes  H35.033     6. Pseudophakia of both eyes  Z96.1       1-3. Moderate Non-proliferative diabetic retinopathy, OU OD with combination DME/CME  **delayed to follow up from September 2021--7 mos instead of 4 wks**  - exam shows scattered IRH, edema and tortuous blood vessels OU  - FA (10.23.20) shows leaking MA OU and paracentral petaloid hyperfluorecence OD --> CME/[place]  - OCT shows diabetic macular edema, OU; OD with CME/[place] component contributing as well  - s/p IVA OS #1 (10.23.20), #2 (11.18.20), #3 (12.18.20), #4 (02.24.21), #5 (03.24.21)  - s/p IVA OD #1 (11.06.20), #2 (12.18.20), #3 (02.24.21), #4 (03.24.21), #5 (04.21.21), #6 (5.26.21), #7 (07.26.21), #8 (08.25.21), #9 (09.23.21), #10 (04.26.22)  - s/p IVE OS #1 (04.21.21), # 2 (5.26.21), #3  (07.26.21), #4 (08.25.21), #5 (09.23.21), #6 (04.26.22), #7 (05.25.22)  - s/p IVE OD #1 (05.25.22)  - BCVA OD 20/20; OS 20/200 improved from 20/400  - OCT today shows OD: Mild interval improvement in IRF inferior macula; OS: Persistent IRF temporal and inferior macula -- minimal improvement  - recommend IVE OD #2 and IVE #8 OS 07.01.22  - pt wishes to proceed  - RBA of procedure discussed, questions answered  - informed consent obtained  - [place] informed consent form signed and scanned on 12.18.2020  - Eylea OS informed consent form signed and scanned on 4.21.2021  - Eylea OD informed consent form signed and scanned on 05.25.22  - see procedure note  - Eylea4U benefits investigation started, 04.21.21  - cont Pred Forte and Ketorolac QID OU  - f/u in 4 weeks, DFE, OCT, possible injection OU  4,5. Hypertensive retinopathy OU  - discussed importance of tight BP control  - monitor  6. Pseudophakia OU  - s/p CE/IOL OU Gershon Crane)   OD: 03/07/2019   OS: 02/28/2019  - IOLs in good position  - CME OU as above  - monitor  Ophthalmic Meds Ordered this visit:  Meds ordered this encounter  Medications   aflibercept (EYLEA) SOLN 2 mg   aflibercept (EYLEA) SOLN 2 mg      Return in about 4 weeks (around 12/26/2020) for f/u NPDR OU, DFE, OCT.  There are no Patient Instructions on file for this visit.   This document serves as a record of services personally performed by Gardiner Sleeper, MD, PhD. It was created on their behalf by San Jetty. Owens Shark, OA an ophthalmic technician. The creation of this record is the provider's dictation and/or activities during the visit.    Electronically signed by: San Jetty. Marguerita Merles 06.29.2022 5:38 PM  Gardiner Sleeper, M.D., Ph.D. Diseases & Surgery of the Retina and Vitreous [facility]  I have reviewed the above documentation for accuracy and completeness, and I agree with the above. Gardiner Sleeper, M.D., Ph.D. 11/28/20 5:38  PM   Abbreviations: M myopia (nearsighted); A astigmatism; H hyperopia (farsighted); P presbyopia; Mrx spectacle prescription;  CTL contact lenses; OD right eye; OS left eye; OU both eyes  XT exotropia; ET esotropia; PEK punctate epithelial keratitis; PEE punctate epithelial erosions; DES dry eye syndrome; MGD meibomian gland dysfunction; ATs artificial tears; PFAT's preservative free artificial tears; [place] nuclear sclerotic cataract;  PSC posterior subcapsular cataract; ERM epi-retinal membrane; PVD posterior vitreous detachment; RD retinal detachment; DM diabetes mellitus; DR diabetic retinopathy; NPDR non-proliferative diabetic retinopathy; PDR proliferative diabetic retinopathy; CSME clinically significant macular edema; DME diabetic macular edema; dbh dot blot hemorrhages; CWS cotton wool spot; POAG primary open angle glaucoma; C/D cup-to-disc ratio; HVF humphrey visual field; GVF goldmann visual field; OCT optical coherence tomography; IOP intraocular pressure; BRVO Branch retinal vein occlusion; CRVO central retinal vein occlusion; CRAO central retinal artery occlusion; BRAO branch retinal artery occlusion; RT retinal tear; SB scleral buckle; PPV pars plana vitrectomy; VH Vitreous hemorrhage; PRP panretinal laser photocoagulation; IVK intravitreal kenalog; VMT vitreomacular traction; MH Macular hole;  NVD neovascularization of the disc; NVE neovascularization elsewhere; AREDS age related eye disease study; ARMD age related macular degeneration; POAG primary open angle glaucoma; EBMD epithelial/anterior basement membrane dystrophy; ACIOL anterior chamber intraocular lens; IOL intraocular lens; PCIOL posterior chamber intraocular lens; Phaco/IOL phacoemulsification with intraocular lens placement; [place] photorefractive keratectomy; LASIK laser assisted in situ keratomileusis; HTN hypertension; DM diabetes mellitus; COPD chronic obstructive pulmonary disease

## 2020-11-28 ENCOUNTER — Other Ambulatory Visit: Payer: Self-pay

## 2020-11-28 ENCOUNTER — Ambulatory Visit (INDEPENDENT_AMBULATORY_CARE_PROVIDER_SITE_OTHER): Payer: Medicare Other | Admitting: Ophthalmology

## 2020-11-28 ENCOUNTER — Encounter (INDEPENDENT_AMBULATORY_CARE_PROVIDER_SITE_OTHER): Payer: Self-pay | Admitting: Ophthalmology

## 2020-11-28 DIAGNOSIS — E113313 Type 2 diabetes mellitus with moderate nonproliferative diabetic retinopathy with macular edema, bilateral: Secondary | ICD-10-CM

## 2020-11-28 DIAGNOSIS — H35353 Cystoid macular degeneration, bilateral: Secondary | ICD-10-CM

## 2020-11-28 DIAGNOSIS — H3581 Retinal edema: Secondary | ICD-10-CM | POA: Diagnosis not present

## 2020-11-28 DIAGNOSIS — H35033 Hypertensive retinopathy, bilateral: Secondary | ICD-10-CM

## 2020-11-28 DIAGNOSIS — I10 Essential (primary) hypertension: Secondary | ICD-10-CM

## 2020-11-28 DIAGNOSIS — Z961 Presence of intraocular lens: Secondary | ICD-10-CM

## 2020-11-28 MED ORDER — AFLIBERCEPT 2MG/0.05ML IZ SOLN FOR KALEIDOSCOPE
2.0000 mg | INTRAVITREAL | Status: AC | PRN
  Administered 2020-11-28: 2 mg via INTRAVITREAL

## 2020-12-03 ENCOUNTER — Telehealth: Payer: Self-pay | Admitting: Gastroenterology

## 2020-12-03 ENCOUNTER — Encounter: Payer: Medicare Other | Admitting: Gastroenterology

## 2020-12-03 NOTE — Telephone Encounter (Signed)
Understood. No late charge for this first cancellation. Please place a recall in system for 17-months if patient has not called to reschedule her procedure by then. Thanks. GM

## 2020-12-08 ENCOUNTER — Other Ambulatory Visit: Payer: Self-pay

## 2020-12-11 ENCOUNTER — Other Ambulatory Visit: Payer: Self-pay

## 2020-12-11 MED FILL — Insulin Syringe/Needle U-100 0.3 ML 31 x 15/64": 33 days supply | Qty: 100 | Fill #0 | Status: AC

## 2020-12-12 ENCOUNTER — Other Ambulatory Visit: Payer: Self-pay

## 2020-12-15 ENCOUNTER — Encounter: Payer: Self-pay | Admitting: Gastroenterology

## 2020-12-23 NOTE — Progress Notes (Signed)
[facility] Note  12/26/2020     CHIEF COMPLAINT Patient presents for Retina Follow Up   HISTORY OF PRESENT ILLNESS: Kimberly Frazier is a 60 y.o. female who presents to the clinic today for:   HPI     Retina Follow Up   Patient presents with  Diabetic Retinopathy.  In both eyes.  This started 4.  Duration of weeks.  I, the attending physician,  performed the HPI with the patient and updated documentation appropriately.        Comments   Patient here for 4 weeks retina follow up for NPDR OU. Patient states vision doing ok. OS has Pain. More than usual.      Last edited by Bernarda Caffey, MD on 12/26/2020 11:08 PM.    No change in [place].  Eyes have been hurting a bit.  Referring physician: Rutherford Guys, MD [place],  [place] [address]  HISTORICAL INFORMATION:   Selected notes from the MEDICAL RECORD NUMBER Referred by Dr. Rutherford Guys for concern of CME   CURRENT MEDICATIONS: Current Outpatient Medications (Ophthalmic Drugs)  Medication Sig   Bromfenac Sodium (PROLENSA) 0.07 % SOLN Place 1 drop into both eyes 4 (four) times daily.   ketorolac (ACULAR) 0.5 % ophthalmic solution Place 1 drop into both eyes 4 (four) times daily.   prednisoLONE acetate (PRED FORTE) 1 % ophthalmic suspension Place 1 drop into both eyes 4 (four) times daily.   No current facility-administered medications for this visit. (Ophthalmic Drugs)   Current Outpatient Medications (Other)  Medication Sig   Accu-Chek Softclix Lancets lancets Use to check blood sugar 3 times daily.   amLODipine (NORVASC) 10 MG tablet TAKE 1 TABLET (10 MG TOTAL) BY MOUTH DAILY.   aspirin EC 81 MG tablet Take 1 tablet (81 mg total) daily by mouth.   atorvastatin (LIPITOR) 80 MG tablet Take 1 tablet (80 mg total) by mouth daily.   Blood Glucose Monitoring Suppl (ACCU-CHEK GUIDE) w/Device KIT 1 kit by Does not apply route in the morning, at noon, and at bedtime. Use to check blood  sugar 3 times daily.   butalbital-acetaminophen-caffeine (FIORICET) 50-325-40 MG tablet Take 1 tablet by mouth every 6 (six) hours as needed for headache. (Patient not taking: Reported on 11/19/2020)   carvedilol (COREG) 25 MG tablet TAKE 1 TABLET (25 MG TOTAL) BY MOUTH 2 (TWO) TIMES DAILY WITH A MEAL.   empagliflozin (JARDIANCE) 10 MG TABS tablet TAKE 1 TABLET (10 MG TOTAL) BY MOUTH DAILY.   glucose blood (ACCU-CHEK GUIDE) test strip Use to check blood sugar 3 times daily.   insulin aspart (NOVOLOG) 100 UNIT/ML injection Inject 28 Units into the skin 3 (three) times daily before meals.   insulin glargine (LANTUS) 100 UNIT/ML Solostar Pen INJECT 58 UNITS INTO THE SKIN DAILY.   Insulin Syringe-Needle U-100 (BD INSULIN SYRINGE) 29G X 1/2" 0.3 ML MISC Use to inject Novolog TID.   Insulin Syringe-Needle U-100 31G X 15/64" 0.3 ML MISC USE TO INJECT NOVOLOG THREE TIMES DAILY   isosorbide mononitrate (IMDUR) 60 MG 24 hr tablet TAKE 1 TABLET (60 MG TOTAL) BY MOUTH DAILY.   Na Sulfate-K Sulfate-Mg Sulf 17.5-3.13-1.6 GM/177ML SOLN Suprep (no substitutions)-TAKE AS DIRECTED.   nitroGLYCERIN (NITROSTAT) 0.4 MG SL tablet Place 1 tab under tongue for chest pain.  May repeat after 5 minutes x 2.  DO NOT TAKE MORE THAN 3 TABS DURING AN EPISODE OF CHEST PAIN (Patient not taking: Reported on  11/19/2020)   oxybutynin (DITROPAN-XL) 5 MG 24 hr tablet Take 1 tablet (5 mg total) by mouth at bedtime.   potassium chloride (KLOR-CON) 10 MEQ tablet TAKE 1 TABLET BY MOUTH EVERY DAY   TRUEPLUS PEN NEEDLES 32G X 4 MM MISC USE AS DIRECTED WITH INSULIN   No current facility-administered medications for this visit. (Other)      REVIEW OF SYSTEMS: ROS   Positive for: Neurological, Endocrine, Cardiovascular, Eyes Negative for: Constitutional, Gastrointestinal, Skin, Genitourinary, Musculoskeletal, HENT, Respiratory, Psychiatric, Allergic/Imm, Heme/Lymph Last edited by Theodore Demark, COA on 12/26/2020  1:06 PM.          ALLERGIES No Known Allergies  PAST MEDICAL HISTORY Past Medical History:  Diagnosis Date   Adrenal adenoma, left    Arthritis    Cancer ([place])    CKD (chronic kidney disease), stage II    Coronary artery disease 07-28-2018 followed [facility])  currently due to no insurance   per cardiac cath 02-06-2015 (positive mild lateral ishcemia on stress test)--- dLAD 80%,  mLAD 40%,  mPDA 80% (small vessel),  ostial D1 70%,  CFx with lumial irregarlities-- medical management   Depression    Diabetic neuropathy ([place])    Diabetic retinopathy ([place])    NPDR OU   Headache    History of Bell's palsy 07/2011   per pt residual facial pain on left side occasionally   History of cancer of vagina 1999   per pt completed radiation and chemo   History of sepsis 12/30/2017   positive blood culter fro E.coli   Hyperlipidemia    Hypertension    Hypertensive retinopathy    OU   Hypokalemia    Insulin dependent type 2 diabetes mellitus, uncontrolled ([place])    followed by pcp---  A1c was 11.7 on 06-15-2018 in epic   Myocardial infarction [facility])    10 yrs ago?   Nocturia    Peripheral neuropathy    PMB (postmenopausal bleeding)    Wears dentures    upper   Wears glasses    Past Surgical History:  Procedure Laterality Date   BREAST BIOPSY  2012   benign   CARDIAC CATHETERIZATION N/A 02/06/2015   Procedure: Left Heart Cath and Coronary Angiography;  Surgeon: Larey Dresser, MD;  Location: [facility];  Service: Cardiovascular;  Laterality: N/A;   CATARACT EXTRACTION Bilateral OD: 03/07/19, OS: 02/28/19   Dr. Gershon Crane   CATARACT EXTRACTION, BILATERAL     COLONOSCOPY     normal exam in [place], [place] 10-12 years ago   EYE SURGERY Bilateral OD: 03/07/19, OS: 02/28/19   Cat Sx - Dr. Gershon Crane   HYSTEROSCOPY WITH D & C N/A 08/01/2018   Procedure: DILATATION AND CURETTAGE /HYSTEROSCOPY;  Surgeon: Mora Bellman, MD;  Location: [facility];  Service:  Gynecology;  Laterality: N/A;   ROBOTIC ASSISTED TOTAL HYSTERECTOMY WITH BILATERAL SALPINGO OOPHERECTOMY N/A 11/28/2018   Procedure: XI ROBOTIC ASSISTED TOTAL HYSTERECTOMY WITH BILATERAL SALPINGO OOPHORECTOMY;  Surgeon: Everitt Amber, MD;  Location: [facility];  Service: Gynecology;  Laterality: N/A;   SENTINEL NODE BIOPSY N/A 11/28/2018   Procedure: SENTINEL NODE BIOPSY;  Surgeon: Everitt Amber, MD;  Location: [facility];  Service: Gynecology;  Laterality: N/A;   UMBILICAL HERNIA REPAIR  child    FAMILY HISTORY Family History  Problem Relation Age of Onset   Heart disease Mother    Hypertension Mother    Diabetes Mother    Cancer Father  hodgkins lymphoma   Heart disease Sister        heart attack   Diabetes Sister    Colon cancer Brother 24   Diabetes Maternal Aunt    Diabetes Maternal Uncle    Cancer Other        parent   Diabetes Other        parent   Heart disease Other        parent   Hyperlipidemia Other        parent   Hypertension Other        parent   Arthritis Other        parent   Breast cancer Neg Hx    Colon polyps Neg Hx    Esophageal cancer Neg Hx    Rectal cancer Neg Hx    Stomach cancer Neg Hx     SOCIAL HISTORY Social History   Tobacco Use   Smoking status: Never   Smokeless tobacco: Never  Vaping Use   Vaping Use: Never used  Substance Use Topics   Alcohol use: Not Currently   Drug use: Yes    Types: Marijuana    Comment: every 2 weeks per pt         OPHTHALMIC EXAM:  Base Eye Exam     Visual Acuity (Snellen - Linear)       Right Left   Dist [place] 20/25 +1 20/350 +1   Dist ph [place] 20/20 20/250 -2         Tonometry (Tonopen, 1:03 PM)       Right Left   Pressure 21 22         Pupils       Dark Light Shape React APD   Right 4 3 Round Brisk None   Left 4 3 Round Brisk None         Visual Fields (Counting fingers)       Left Right    Full Full         Extraocular Movement       Right Left    Full Full          Neuro/Psych     Oriented x3: Yes   Mood/Affect: Normal         Dilation     Both eyes: 1.0% Mydriacyl, 2.5% Phenylephrine @ 1:03 PM           Slit Lamp and Fundus Exam     Slit Lamp Exam       Right Left   Lids/Lashes Dermatochalasis - upper lid Dermatochalasis - upper lid   Conjunctiva/Sclera Mild Melanosis Mild Melanosis   Cornea 1+ Punctate epithelial erosions 2+ central punctate epithelial erosions, decreased TBUT, mild tear film debris, well healed temporal cataract wounds   Anterior Chamber Deep, 0.5+cell/pigment Deep, 0.5+cell/pigment   Iris Round and dilated, No NVI Round and dilated, No NVI   Lens PC IOL in good position with trace PCO PC IOL in good position with 1+PCO   Vitreous Vitreous syneresis Vitreous syneresis, Posterior vitreous detachment, vitreous condensations         Fundus Exam       Right Left   Disc Pallor, Sharp rim, pink Compact, Pink and Sharp, temporal PPA   C/D Ratio 0.1 0.1   Macula blunted foveal reflex, +central edema - slightly improved, scattered Microaneurysms, focal exudates inferior to fovea -- improved Blunted foveal reflex, central edema - persistent, +exudates/MA/IRH--greatest temporal macula   Vessels attenuated, Tortuous attenuated,  Tortuous   Periphery Attached, scattered drusen, scattered DBH  Attached, scattered DBH greatest posteriorly, mild peripheral drusen            IMAGING AND PROCEDURES  Imaging and Procedures for _0 @  OCT, Retina - OU - Both Eyes       Right Eye Quality was good. Central Foveal Thickness: 312. Progression has improved. Findings include abnormal foveal contour, intraretinal fluid, no SRF, intraretinal hyper-reflective material, vitreomacular adhesion (Mild interval improvement in IRF inferior macula).   Left Eye Quality was good. Central Foveal Thickness: 374. Progression has improved. Findings include abnormal foveal contour, intraretinal fluid, intraretinal hyper-reflective material,  vitreomacular adhesion , outer retinal atrophy, subretinal hyper-reflective material, no SRF (Interval improvement in IRF temporal and inferior macula).   Notes *Images captured and stored on drive  Diagnosis / Impression:  Macular edema OU - likely combination of CME + DME OU OD: Mild interval improvement in IRF inferior macula OS: Interval improvement in IRF temporal and inferior macula  Clinical management:  See below  Abbreviations: NFP - Normal foveal profile. CME - cystoid macular edema. PED - pigment epithelial detachment. IRF - intraretinal fluid. SRF - subretinal fluid. EZ - ellipsoid zone. ERM - epiretinal membrane. ORA - outer retinal atrophy. ORT - outer retinal tubulation. SRHM - subretinal hyper-reflective material      Intravitreal Injection, Pharmacologic Agent - OD - Right Eye       Time Out 12/26/2020. 1:47 PM. Confirmed correct patient, procedure, site, and patient consented.   Anesthesia Topical anesthesia was used. Anesthetic medications included Lidocaine 2%, Proparacaine 0.5%.   Procedure Preparation included 5% betadine to ocular surface, eyelid speculum. A (32g) needle was used.   Injection: 2 mg aflibercept 2 MG/0.05ML   Route: Intravitreal, Site: Right Eye   NDC: A3590391, Lot: [id number], Expiration date: 09/27/2021, Waste: 0.05 mL   Post-op Post injection exam found visual acuity of at least counting fingers. The patient tolerated the procedure well. There were no complications. The patient received written and verbal post procedure care education. Post injection medications were not given.      Intravitreal Injection, Pharmacologic Agent - OS - Left Eye       Time Out 12/26/2020. 1:50 PM. Confirmed correct patient, procedure, site, and patient consented.   Anesthesia Topical anesthesia was used. Anesthetic medications included Lidocaine 2%, Proparacaine 0.5%.   Procedure Preparation included 5% betadine to ocular surface, eyelid  speculum. A (32g) needle was used.   Injection: 2 mg aflibercept 2 MG/0.05ML   Route: Intravitreal, Site: Left Eye   NDC: A3590391, Lot: [id number], Expiration date: 09/26/2020, Waste: 0.05 mL   Post-op Post injection exam found visual acuity of at least counting fingers. The patient tolerated the procedure well. There were no complications. The patient received written and verbal post procedure care education. Post injection medications were not given.                ASSESSMENT/PLAN:    ICD-10-CM   1. Moderate nonproliferative diabetic retinopathy of both eyes with macular edema associated with type 2 diabetes mellitus (HCC)  E70.3500 Intravitreal Injection, Pharmacologic Agent - OD - Right Eye    Intravitreal Injection, Pharmacologic Agent - OS - Left Eye    aflibercept (EYLEA) SOLN 2 mg    aflibercept (EYLEA) SOLN 2 mg    2. CME (cystoid macular edema), bilateral  H35.353     3. Retinal edema  H35.81 OCT, Retina - OU - Both Eyes    4. Hypertensive retinopathy  of both eyes  H35.033     5. Pseudophakia of both eyes  Z96.1     6. Essential hypertension  I10      1-3. Moderate Non-proliferative diabetic retinopathy, OU OD with combination DME/CME  **delayed to follow up from September 2021--7 mos instead of 4 wks**  - exam shows scattered IRH, edema and tortuous blood vessels OU  - FA (10.23.20) shows leaking MA OU and paracentral petaloid hyperfluorecence OD --> CME/[place]  - OCT shows diabetic macular edema, OU; OD with CME/[place] component contributing as well  - s/p IVA OS #1 (10.23.20), #2 (11.18.20), #3 (12.18.20), #4 (02.24.21), #5 (03.24.21)  - s/p IVA OD #1 (11.06.20), #2 (12.18.20), #3 (02.24.21), #4 (03.24.21), #5 (04.21.21), #6 (5.26.21), #7 (07.26.21), #8 (08.25.21), #9 (09.23.21), #10 (04.26.22)  - s/p IVE OS #1 (04.21.21), #2 (5.26.21), #3 (07.26.21), #4 (08.25.21), #5 (09.23.21), #6 (04.26.22), #7 (05.25.22), #8 (07.01.22)  - s/p IVE OD #1  (05.25.22), #2 (07.01.22)  - BCVA OD 20/20; OS 20/200 improved from 20/400  - OCT today shows OD: Mild interval improvement in IRF inferior macula; OS: Interval improvement in IRF temporal and inferior macula  - recommend IVE OD #3 and IVE #9 OS today, 07.29.22  - pt wishes to proceed  - RBA of procedure discussed, questions answered  - informed consent obtained  - [place] informed consent form signed and scanned on 12.18.2020  - Eylea OS informed consent form signed and scanned on 4.21.2021  - Eylea OD informed consent form signed and scanned on 05.25.22  - see procedure note  - Eylea4U benefits investigation started, 04.21.21  - cont Pred Forte and Ketorolac QID OU  - f/u in 4 weeks, DFE, OCT, possible injection OU  4,5. Hypertensive retinopathy OU  - discussed importance of tight BP control  - monitor  6. Pseudophakia OU  - s/p CE/IOL OU Gershon Crane)   OD: 03/07/2019   OS: 02/28/2019  - IOLs in good position  - CME OU as above  - monitor  Ophthalmic Meds Ordered this visit:  Meds ordered this encounter  Medications   aflibercept (EYLEA) SOLN 2 mg   aflibercept (EYLEA) SOLN 2 mg       Return in about 4 weeks (around 01/23/2021) for 4 wk f/u for mod NPDR OU w/DFE/OCT/likely injs..  There are no Patient Instructions on file for this visit.  This document serves as a record of services personally performed by Gardiner Sleeper, MD, PhD. It was created on their behalf by Estill Bakes, COT an ophthalmic technician. The creation of this record is the provider's dictation and/or activities during the visit.    Electronically signed by: Estill Bakes, COT 7.29.22 @ 11:10 PM   Gardiner Sleeper, M.D., Ph.D. Diseases & Surgery of the Retina and [place] 12/26/2020   I have reviewed the above documentation for accuracy and completeness, and I agree with the above. Gardiner Sleeper, M.D., Ph.D. 12/26/20 11:11 PM  Abbreviations: M myopia  (nearsighted); A astigmatism; H hyperopia (farsighted); P presbyopia; Mrx spectacle prescription;  CTL contact lenses; OD right eye; OS left eye; OU both eyes  XT exotropia; ET esotropia; PEK punctate epithelial keratitis; PEE punctate epithelial erosions; DES dry eye syndrome; MGD meibomian gland dysfunction; ATs artificial tears; PFAT's preservative free artificial tears; [place] nuclear sclerotic cataract; PSC posterior subcapsular cataract; ERM epi-retinal membrane; PVD posterior vitreous detachment; RD retinal detachment; DM diabetes mellitus; DR diabetic retinopathy; NPDR non-proliferative diabetic retinopathy; PDR proliferative diabetic  retinopathy; CSME clinically significant macular edema; DME diabetic macular edema; dbh dot blot hemorrhages; CWS cotton wool spot; POAG primary open angle glaucoma; C/D cup-to-disc ratio; HVF humphrey visual field; GVF goldmann visual field; OCT optical coherence tomography; IOP intraocular pressure; BRVO Branch retinal vein occlusion; CRVO central retinal vein occlusion; CRAO central retinal artery occlusion; BRAO branch retinal artery occlusion; RT retinal tear; SB scleral buckle; PPV pars plana vitrectomy; VH Vitreous hemorrhage; PRP panretinal laser photocoagulation; IVK intravitreal kenalog; VMT vitreomacular traction; MH Macular hole;  NVD neovascularization of the disc; NVE neovascularization elsewhere; AREDS age related eye disease study; ARMD age related macular degeneration; POAG primary open angle glaucoma; EBMD epithelial/anterior basement membrane dystrophy; ACIOL anterior chamber intraocular lens; IOL intraocular lens; PCIOL posterior chamber intraocular lens; Phaco/IOL phacoemulsification with intraocular lens placement; [place] photorefractive keratectomy; LASIK laser assisted in situ keratomileusis; HTN hypertension; DM diabetes mellitus; COPD chronic obstructive pulmonary disease

## 2020-12-26 ENCOUNTER — Encounter (INDEPENDENT_AMBULATORY_CARE_PROVIDER_SITE_OTHER): Payer: Self-pay | Admitting: Ophthalmology

## 2020-12-26 ENCOUNTER — Ambulatory Visit (INDEPENDENT_AMBULATORY_CARE_PROVIDER_SITE_OTHER): Payer: Medicare Other | Admitting: Ophthalmology

## 2020-12-26 ENCOUNTER — Other Ambulatory Visit: Payer: Self-pay

## 2020-12-26 DIAGNOSIS — H3581 Retinal edema: Secondary | ICD-10-CM | POA: Diagnosis not present

## 2020-12-26 DIAGNOSIS — Z961 Presence of intraocular lens: Secondary | ICD-10-CM

## 2020-12-26 DIAGNOSIS — E113313 Type 2 diabetes mellitus with moderate nonproliferative diabetic retinopathy with macular edema, bilateral: Secondary | ICD-10-CM

## 2020-12-26 DIAGNOSIS — H35033 Hypertensive retinopathy, bilateral: Secondary | ICD-10-CM

## 2020-12-26 DIAGNOSIS — I10 Essential (primary) hypertension: Secondary | ICD-10-CM

## 2020-12-26 DIAGNOSIS — H35353 Cystoid macular degeneration, bilateral: Secondary | ICD-10-CM | POA: Diagnosis not present

## 2020-12-26 MED ORDER — AFLIBERCEPT 2MG/0.05ML IZ SOLN FOR KALEIDOSCOPE
2.0000 mg | INTRAVITREAL | Status: AC | PRN
  Administered 2020-12-26: 2 mg via INTRAVITREAL

## 2020-12-29 ENCOUNTER — Ambulatory Visit: Payer: Medicare Other | Attending: Internal Medicine | Admitting: Internal Medicine

## 2020-12-29 ENCOUNTER — Other Ambulatory Visit: Payer: Self-pay | Admitting: Internal Medicine

## 2020-12-29 ENCOUNTER — Other Ambulatory Visit: Payer: Self-pay

## 2020-12-29 ENCOUNTER — Other Ambulatory Visit (INDEPENDENT_AMBULATORY_CARE_PROVIDER_SITE_OTHER): Payer: Self-pay

## 2020-12-29 ENCOUNTER — Telehealth (INDEPENDENT_AMBULATORY_CARE_PROVIDER_SITE_OTHER): Payer: Self-pay

## 2020-12-29 DIAGNOSIS — N760 Acute vaginitis: Secondary | ICD-10-CM | POA: Diagnosis not present

## 2020-12-29 DIAGNOSIS — I10 Essential (primary) hypertension: Secondary | ICD-10-CM

## 2020-12-29 DIAGNOSIS — I2510 Atherosclerotic heart disease of native coronary artery without angina pectoris: Secondary | ICD-10-CM

## 2020-12-29 DIAGNOSIS — R300 Dysuria: Secondary | ICD-10-CM | POA: Diagnosis not present

## 2020-12-29 MED ORDER — INSULIN ASPART 100 UNIT/ML IJ SOLN
28.0000 [IU] | Freq: Three times a day (TID) | INTRAMUSCULAR | 0 refills | Status: DC
  Filled 2020-12-29: qty 70, 84d supply, fill #0
  Filled 2021-07-28: qty 70, 84d supply, fill #1
  Filled 2021-07-28: qty 70, 84d supply, fill #0

## 2020-12-29 MED ORDER — CARVEDILOL 25 MG PO TABS
ORAL_TABLET | Freq: Two times a day (BID) | ORAL | 0 refills | Status: DC
  Filled 2020-12-29: qty 60, 30d supply, fill #0

## 2020-12-29 MED ORDER — FLUCONAZOLE 150 MG PO TABS
150.0000 mg | ORAL_TABLET | Freq: Every day | ORAL | 0 refills | Status: DC
  Filled 2020-12-29: qty 1, 1d supply, fill #0

## 2020-12-29 MED ORDER — CIPROFLOXACIN HCL 500 MG PO TABS
500.0000 mg | ORAL_TABLET | Freq: Two times a day (BID) | ORAL | 0 refills | Status: DC
  Filled 2020-12-29: qty 14, 7d supply, fill #0

## 2020-12-29 MED ORDER — PREDNISOLONE ACETATE 1 % OP SUSP
1.0000 [drp] | Freq: Four times a day (QID) | OPHTHALMIC | 0 refills | Status: DC
  Filled 2020-12-29: qty 10, 50d supply, fill #0

## 2020-12-29 MED ORDER — KETOROLAC TROMETHAMINE 0.5 % OP SOLN
1.0000 [drp] | Freq: Four times a day (QID) | OPHTHALMIC | 0 refills | Status: DC
  Filled 2020-12-29: qty 5, 25d supply, fill #0
  Filled 2020-12-31: qty 5, 11d supply, fill #0

## 2020-12-29 NOTE — Telephone Encounter (Signed)
Requested Prescriptions  Pending Prescriptions Disp Refills  . carvedilol (COREG) 25 MG tablet 60 tablet 0    Sig: TAKE 1 TABLET (25 MG TOTAL) BY MOUTH 2 (TWO) TIMES DAILY WITH A MEAL.     Cardiovascular:  Beta Blockers Passed - 12/29/2020  9:32 AM      Passed - Last BP in normal range    BP Readings from Last 1 Encounters:  10/16/20 128/78         Passed - Last Heart Rate in normal range    Pulse Readings from Last 1 Encounters:  10/08/20 94         Passed - Valid encounter within last 6 months    Recent Outpatient Visits          2 months ago Essential hypertension   [place], Jarome Matin, RPH-CPP   3 months ago Encounter for [employer] annual wellness exam   [place] Karle Plumber B, MD   5 months ago Type 2 diabetes mellitus with morbid obesity [facility])   [place], MD   6 months ago [place], Levada Dy [place], [place]   9 months ago Need for influenza vaccination   [place], RPH-CPP      Future Appointments            In 2 weeks Ladell Pier, MD [place]   In 1 month [place], Jossie Ng, NP [place], [facility]

## 2020-12-29 NOTE — Telephone Encounter (Signed)
Contacted pt and scheduled a virtual appt with Dr. Wynetta Emery for 8/1 at 150pm

## 2020-12-29 NOTE — Telephone Encounter (Signed)
Copied from [place] [phone]. Topic: General - Inquiry >> Dec 29, 2020 11:56 AM Greggory Keen D wrote: Reason for CRM: Pt called saying she thinks she has a UTI and would like Dr. Wynetta Emery to call in an antibiotic.  CB#  [phone] >> Dec 29, 2020  1:10 PM Nat Christen, CMA wrote: Will patient need an appt or can she do UA and be treated. Please contact patient to advise.

## 2020-12-29 NOTE — Progress Notes (Signed)
Patient ID: Kimberly Frazier, female   DOB: 1961-01-02, 60 y.o.   MRN: [id number] Virtual Visit via Telephone Note  I connected with Kimberly Frazier on 12/29/2020 at 2:02 p.m by telephone and verified that I am speaking with the correct person using two identifiers  Location: Patient: home Provider: office  Participants: Myself Patient   I discussed the limitations, risks, security and privacy concerns of performing an evaluation and management service by telephone and the availability of in person appointments. I also discussed with the patient that there may be a patient responsible charge related to this service. The patient expressed understanding and agreed to proceed.   History of Present Illness: Patient with history of DM with neuropathy and retinopathy, HTN with hypertensive heart disease, CAD (Coronary CT revealed occlusion in the mid to distal LAD, distal RCA.  Cardiology states that she will need CABG eventually), HL, obesity, endometrial CA s/p hysterectomy.  This is an urgent care visit.  Pt thinks she has UTI, C/o pain in lower back and some mild discomfort when she urinates x 1 month.  She had planned to call for an appt a mth ago but procrastinated. No hematuria or fever. Some vaginal itching.  No vaginal dischg.  Wears incontinence pads which is not new.  Also notices some tingling in hands when she urinates.     Outpatient Encounter Medications as of 12/29/2020  Medication Sig   Accu-Chek Softclix Lancets lancets Use to check blood sugar 3 times daily.   amLODipine (NORVASC) 10 MG tablet TAKE 1 TABLET (10 MG TOTAL) BY MOUTH DAILY.   aspirin EC 81 MG tablet Take 1 tablet (81 mg total) daily by mouth.   atorvastatin (LIPITOR) 80 MG tablet Take 1 tablet (80 mg total) by mouth daily.   Blood Glucose Monitoring Suppl (ACCU-CHEK GUIDE) w/Device KIT 1 kit by Does not apply route in the morning, at noon, and at bedtime. Use to check blood sugar 3 times daily.   Bromfenac Sodium  (PROLENSA) 0.07 % SOLN Place 1 drop into both eyes 4 (four) times daily.   butalbital-acetaminophen-caffeine (FIORICET) 50-325-40 MG tablet Take 1 tablet by mouth every 6 (six) hours as needed for headache. (Patient not taking: Reported on 11/19/2020)   carvedilol (COREG) 25 MG tablet TAKE 1 TABLET (25 MG TOTAL) BY MOUTH 2 (TWO) TIMES DAILY WITH A MEAL.   empagliflozin (JARDIANCE) 10 MG TABS tablet TAKE 1 TABLET (10 MG TOTAL) BY MOUTH DAILY.   glucose blood (ACCU-CHEK GUIDE) test strip Use to check blood sugar 3 times daily.   insulin aspart (NOVOLOG) 100 UNIT/ML injection Inject 28 Units into the skin 3 (three) times daily before meals.   insulin glargine (LANTUS) 100 UNIT/ML Solostar Pen INJECT 58 UNITS INTO THE SKIN DAILY.   Insulin Syringe-Needle U-100 (BD INSULIN SYRINGE) 29G X 1/2" 0.3 ML MISC Use to inject Novolog TID.   Insulin Syringe-Needle U-100 31G X 15/64" 0.3 ML MISC USE TO INJECT NOVOLOG THREE TIMES DAILY   isosorbide mononitrate (IMDUR) 60 MG 24 hr tablet TAKE 1 TABLET (60 MG TOTAL) BY MOUTH DAILY.   ketorolac (ACULAR) 0.5 % ophthalmic solution Place 1 drop into both eyes 4 (four) times daily.   ketorolac (ACULAR) 0.5 % ophthalmic solution Place 1 drop into both eyes 4 (four) times daily.   Na Sulfate-K Sulfate-Mg Sulf 17.5-3.13-1.6 GM/177ML SOLN Suprep (no substitutions)-TAKE AS DIRECTED.   nitroGLYCERIN (NITROSTAT) 0.4 MG SL tablet Place 1 tab under tongue for chest pain.  May repeat after 5 minutes  x 2.  DO NOT TAKE MORE THAN 3 TABS DURING AN EPISODE OF CHEST PAIN (Patient not taking: Reported on 11/19/2020)   oxybutynin (DITROPAN-XL) 5 MG 24 hr tablet Take 1 tablet (5 mg total) by mouth at bedtime.   potassium chloride (KLOR-CON) 10 MEQ tablet TAKE 1 TABLET BY MOUTH EVERY DAY   prednisoLONE acetate (PRED FORTE) 1 % ophthalmic suspension Place 1 drop into both eyes 4 (four) times daily.   prednisoLONE acetate (PRED FORTE) 1 % ophthalmic suspension Place 1 drop into both eyes 4  (four) times daily.   TRUEPLUS PEN NEEDLES 32G X 4 MM MISC USE AS DIRECTED WITH INSULIN   No facility-administered encounter medications on file as of 12/29/2020.      Observations/Objective: No direct observation done as this was a telephone visit.  Assessment and Plan: 1. Dysuria Will start on ciprofloxacin empirically.  She will come to the lab to have urinalysis and urine culture done. - Urinalysis, Routine w reflex microscopic; Future - Urine Culture - ciprofloxacin (CIPRO) 500 MG tablet; Take 1 tablet (500 mg total) by mouth 2 (two) times daily.  Dispense: 14 tablet; Refill: 0  2. Acute vaginitis Likely yeast.  We will treat empirically with Diflucan. - fluconazole (DIFLUCAN) 150 MG tablet; Take 1 tablet (150 mg total) by mouth daily.  Dispense: 1 tablet; Refill: 0   Follow Up Instructions: Keep routine follow-up visit later this month.   I discussed the assessment and treatment plan with the patient. The patient was provided an opportunity to ask questions and all were answered. The patient agreed with the plan and demonstrated an understanding of the instructions.   The patient was advised to call back or seek an in-person evaluation if the symptoms worsen or if the condition fails to improve as anticipated.  I  Spent 9 minutes on this telephone encounter  Karle Plumber, MD

## 2020-12-30 ENCOUNTER — Other Ambulatory Visit: Payer: Self-pay

## 2020-12-31 ENCOUNTER — Other Ambulatory Visit: Payer: Self-pay

## 2021-01-01 ENCOUNTER — Other Ambulatory Visit: Payer: Self-pay

## 2021-01-03 LAB — URINE CULTURE

## 2021-01-05 ENCOUNTER — Telehealth: Payer: Self-pay

## 2021-01-05 NOTE — Telephone Encounter (Signed)
Contacted pt to go over lab results pt is aware and doesn't have any questions or concerns 

## 2021-01-07 ENCOUNTER — Other Ambulatory Visit: Payer: Self-pay | Admitting: Internal Medicine

## 2021-01-07 DIAGNOSIS — E876 Hypokalemia: Secondary | ICD-10-CM

## 2021-01-07 DIAGNOSIS — C801 Malignant (primary) neoplasm, unspecified: Secondary | ICD-10-CM | POA: Insufficient documentation

## 2021-01-07 NOTE — Telephone Encounter (Signed)
Requested medication (s) are due for refill today: no  Requested medication (s) are on the active medication list: yes  Last refill:  10/28/2020  Future visit scheduled: yes  Notes to clinic: Patient has appt on 01/16/2021 Patient should have enough until that time   Failed protocol:  K in normal range and within 360 days   Cr in normal range and within 360 days     Requested Prescriptions  Pending Prescriptions Disp Refills   potassium chloride (KLOR-CON) 10 MEQ tablet [Pharmacy Med Name: POTASSIUM CL ER 10 MEQ TABLET] 90 tablet 2    Sig: TAKE 1 Lluveras      Endocrinology:  Minerals - Potassium Supplementation Failed - 01/07/2021 10:23 AM      Failed - K in normal range and within 360 days    Potassium  Date Value Ref Range Status  03/24/2020 3.0 (L) 3.5 - 5.1 mmol/L Final          Failed - Cr in normal range and within 360 days    Creatinine, Ser  Date Value Ref Range Status  03/24/2020 1.23 (H) 0.44 - 1.00 mg/dL Final   Creatinine,U  Date Value Ref Range Status  07/15/2015 107.0 mg/dL Final   Creatinine, Urine  Date Value Ref Range Status  01/01/2018 52.71 mg/dL Final          Passed - Valid encounter within last 12 months    Recent Outpatient Visits           1 week ago [place], Deborah B, MD   2 months ago Essential hypertension   [place], Stephen L, RPH-CPP   3 months ago Encounter for [employer] annual wellness exam   [place], Deborah B, MD   5 months ago Type 2 diabetes mellitus with morbid obesity [facility])   [place] Ladell Pier, MD   7 months ago [place], Dionne Bucy, [place]       Future Appointments             In 1 week Ladell Pier, MD [place]   In 1 month  [place], Jossie Ng, NP [facility] [place], [place]

## 2021-01-09 ENCOUNTER — Ambulatory Visit (AMBULATORY_SURGERY_CENTER): Payer: Medicare Other | Admitting: Gastroenterology

## 2021-01-09 ENCOUNTER — Other Ambulatory Visit: Payer: Self-pay

## 2021-01-09 ENCOUNTER — Encounter: Payer: Self-pay | Admitting: Gastroenterology

## 2021-01-09 VITALS — BP 156/97 | HR 75 | Temp 97.3°F | Resp 17 | Ht 64.0 in | Wt 286.0 lb

## 2021-01-09 DIAGNOSIS — D122 Benign neoplasm of ascending colon: Secondary | ICD-10-CM | POA: Diagnosis not present

## 2021-01-09 DIAGNOSIS — Z1211 Encounter for screening for malignant neoplasm of colon: Secondary | ICD-10-CM | POA: Diagnosis not present

## 2021-01-09 DIAGNOSIS — Z800 Family history of malignant neoplasm of digestive organs: Secondary | ICD-10-CM

## 2021-01-09 MED ORDER — SODIUM CHLORIDE 0.9 % IV SOLN: 500.0000 mL | Freq: Once | INTRAVENOUS | Status: DC

## 2021-01-09 NOTE — Op Note (Signed)
[facility] Patient Name: Kimberly Frazier Procedure Date: 01/09/2021 8:15 AM MRN: [id number] Endoscopist: Justice Britain , MD Age: 60 Referring MD:  Date of Birth: 04-08-1961 Gender: Female Account #: [id number] Procedure:                Colonoscopy Indications:              Screening in patient at increased risk: Colorectal                            cancer in brother before age 67 Medicines:                Monitored Anesthesia Care Procedure:                Pre-Anesthesia Assessment:                           - Prior to the procedure, a History and Physical                            was performed, and patient medications and                            allergies were reviewed. The patient's tolerance of                            previous anesthesia was also reviewed. The risks                            and benefits of the procedure and the sedation                            options and risks were discussed with the patient.                            All questions were answered, and informed consent                            was obtained. Prior Anticoagulants: The patient has                            taken no previous anticoagulant or antiplatelet                            agents except for aspirin. ASA Grade Assessment:                            III - A patient with severe systemic disease. After                            reviewing the risks and benefits, the patient was                            deemed in satisfactory condition to undergo the  procedure.                           After obtaining informed consent, the colonoscope                            was passed under direct vision. Throughout the                            procedure, the patient's blood pressure, pulse, and                            oxygen saturations were monitored continuously. The                            Colonoscope was introduced through the anus and                             advanced to the 5 cm into the ileum. The                            colonoscopy was performed without difficulty. The                            patient tolerated the procedure. The quality of the                            bowel preparation was adequate. The terminal ileum,                            ileocecal valve, appendiceal orifice, and rectum                            were photographed. Scope In: 8:21:00 AM Scope Out: 8:32:02 AM Scope Withdrawal Time: 0 hours 8 minutes 17 seconds  Total Procedure Duration: 0 hours 11 minutes 2 seconds  Findings:                 The digital rectal exam findings include                            hemorrhoids. Pertinent negatives include no                            palpable rectal lesions.                           The terminal ileum and ileocecal valve appeared                            normal.                           A 10 mm polyp was found in the proximal ascending  colon. The polyp was semi-sessile. The polyp was                            removed with a cold snare. Resection and retrieval                            were complete.                           Normal mucosa was found in the entire colon                            otherwise.                           Non-bleeding non-thrombosed external and internal                            hemorrhoids were found during retroflexion, during                            perianal exam and during digital exam. The                            hemorrhoids were Grade II (internal hemorrhoids                            that prolapse but reduce spontaneously). Complications:            No immediate complications. Estimated Blood Loss:     Estimated blood loss was minimal. Impression:               - Hemorrhoids found on digital rectal exam.                           - The examined portion of the ileum was normal.                           - One 10 mm  polyp in the proximal ascending colon,                            removed with a cold snare. Resected and retrieved.                           - Normal mucosa in the entire examined colon                            otherwise.                           - Non-bleeding non-thrombosed external and internal                            hemorrhoids. Recommendation:           - The patient will be observed post-procedure,  until all discharge criteria are met.                           - Discharge patient to home.                           - Patient has a contact number available for                            emergencies. The signs and symptoms of potential                            delayed complications were discussed with the                            patient. Return to normal activities tomorrow.                            Written discharge instructions were provided to the                            patient.                           - High fiber diet.                           - Use FiberCon 1-2 tablets PO daily.                           - Continue present medications.                           - Await pathology results.                           - Repeat colonoscopy in 3 years for surveillance.                           - The findings and recommendations were discussed                            with the patient.                           - The findings and recommendations were discussed                            with the patient's family. Justice Britain, MD 01/09/2021 8:38:44 AM

## 2021-01-09 NOTE — Progress Notes (Signed)
Called to room to assist during endoscopic procedure.  Patient ID and intended procedure confirmed with present staff. Received instructions for my participation in the procedure from the performing physician.  

## 2021-01-09 NOTE — Patient Instructions (Signed)
Please read handouts provided. Continue present medications. Await pathology results. High Fiber Diet. Use FiberCon 1-2 tablets daily. Repeat colonoscopy in 3 years for screening.   YOU HAD AN ENDOSCOPIC PROCEDURE TODAY AT [facility]:   Refer to the procedure report that was given to you for any specific questions about what was found during the examination.  If the procedure report does not answer your questions, please call your gastroenterologist to clarify.  If you requested that your care partner not be given the details of your procedure findings, then the procedure report has been included in a sealed envelope for you to review at your convenience later.  YOU SHOULD EXPECT: Some feelings of bloating in the abdomen. Passage of more gas than usual.  Walking can help get rid of the air that was put into your GI tract during the procedure and reduce the bloating. If you had a lower endoscopy (such as a colonoscopy or flexible sigmoidoscopy) you may notice spotting of blood in your stool or on the toilet paper. If you underwent a bowel prep for your procedure, you may not have a normal bowel movement for a few days.  Please Note:  You might notice some irritation and congestion in your nose or some drainage.  This is from the oxygen used during your procedure.  There is no need for concern and it should clear up in a day or so.  SYMPTOMS TO REPORT IMMEDIATELY:  Following lower endoscopy (colonoscopy or flexible sigmoidoscopy):  Excessive amounts of blood in the stool  Significant tenderness or worsening of abdominal pains  Swelling of the abdomen that is new, acute  Fever of 100F or higher   For urgent or emergent issues, a gastroenterologist can be reached at any hour by calling [phone]. Do not use MyChart messaging for urgent concerns.    DIET:  We do recommend a small meal at first, but then you may proceed to your regular diet.  Drink plenty of fluids but  you should avoid alcoholic beverages for 24 hours.  ACTIVITY:  You should plan to take it easy for the rest of today and you should NOT DRIVE or use heavy machinery until tomorrow (because of the sedation medicines used during the test).    FOLLOW UP: Our staff will call the number listed on your records 48-72 hours following your procedure to check on you and address any questions or concerns that you may have regarding the information given to you following your procedure. If we do not reach you, we will leave a message.  We will attempt to reach you two times.  During this call, we will ask if you have developed any symptoms of COVID 19. If you develop any symptoms (ie: fever, flu-like symptoms, shortness of breath, cough etc.) before then, please call [phone].  If you test positive for Covid 19 in the 2 weeks post procedure, please call and report this information to [place].    If any biopsies were taken you will be contacted by phone or by letter within the next 1-3 weeks.  Please call us at [phone] if you have not heard about the biopsies in 3 weeks.    SIGNATURES/CONFIDENTIALITY: You and/or your care partner have signed paperwork which will be entered into your electronic medical record.  These signatures attest to the fact that that the information above on your After Visit Summary has been reviewed and is understood.  Full responsibility of the confidentiality of this  discharge information lies with you and/or your care-partner.  

## 2021-01-09 NOTE — Progress Notes (Signed)
PT taken to PACU. Monitors in place. VSS. Report given to RN. 

## 2021-01-09 NOTE — Progress Notes (Signed)
Cw vitals and Ph IV.  Pt's states no medical or surgical changes since previsit or office visit.

## 2021-01-09 NOTE — Progress Notes (Signed)
GASTROENTEROLOGY PROCEDURE H&P NOTE   Primary Care Physician: Ladell Pier, MD  HPI: Kimberly Frazier is a 60 y.o. female who presents for Colonoscopy for screening with prior polyps and FHx Colon Cancer.  Past Medical History:  Diagnosis Date   Adrenal adenoma, left    Arthritis    Cancer ([place])    CKD (chronic kidney disease), stage II    Coronary artery disease 07-28-2018 followed by pcp([facility])  currently due to no insurance   per cardiac cath 02-06-2015 (positive mild lateral ishcemia on stress test)--- dLAD 80%,  mLAD 40%,  mPDA 80% (small vessel),  ostial D1 70%,  CFx with lumial irregarlities-- medical management   Depression    Diabetic neuropathy ([place])    Diabetic retinopathy ([place])    NPDR OU   Headache    History of Bell's palsy 07/2011   per pt residual facial pain on left side occasionally   History of cancer of vagina 1999   per pt completed radiation and chemo   History of sepsis 12/30/2017   positive blood culter fro E.coli   Hyperlipidemia    Hypertension    Hypertensive retinopathy    OU   Hypokalemia    Insulin dependent type 2 diabetes mellitus, uncontrolled ([place])    followed by pcp---  A1c was 11.7 on 06-15-2018 in epic   Myocardial infarction [facility])    10 yrs ago?   Nocturia    Peripheral neuropathy    PMB (postmenopausal bleeding)    Wears dentures    upper   Wears glasses    Past Surgical History:  Procedure Laterality Date   BREAST BIOPSY  2012   benign   CARDIAC CATHETERIZATION N/A 02/06/2015   Procedure: Left Heart Cath and Coronary Angiography;  Surgeon: Larey Dresser, MD;  Location: [facility];  Service: Cardiovascular;  Laterality: N/A;   CATARACT EXTRACTION Bilateral OD: 03/07/19, OS: 02/28/19   Dr. Gershon Crane   CATARACT EXTRACTION, BILATERAL     COLONOSCOPY     normal exam in [place], [place] 10-12 years ago   EYE SURGERY Bilateral OD: 03/07/19, OS: 02/28/19   Cat Sx - Dr. Gershon Crane   HYSTEROSCOPY WITH D  & C N/A 08/01/2018   Procedure: DILATATION AND CURETTAGE /HYSTEROSCOPY;  Surgeon: Mora Bellman, MD;  Location: [facility];  Service: Gynecology;  Laterality: N/A;   ROBOTIC ASSISTED TOTAL HYSTERECTOMY WITH BILATERAL SALPINGO OOPHERECTOMY N/A 11/28/2018   Procedure: XI ROBOTIC ASSISTED TOTAL HYSTERECTOMY WITH BILATERAL SALPINGO OOPHORECTOMY;  Surgeon: Everitt Amber, MD;  Location: [facility];  Service: Gynecology;  Laterality: N/A;   SENTINEL NODE BIOPSY N/A 11/28/2018   Procedure: SENTINEL NODE BIOPSY;  Surgeon: Everitt Amber, MD;  Location: [facility];  Service: Gynecology;  Laterality: N/A;   UMBILICAL HERNIA REPAIR  child   Current Outpatient Medications  Medication Sig Dispense Refill   amLODipine (NORVASC) 10 MG tablet TAKE 1 TABLET (10 MG TOTAL) BY MOUTH DAILY. 90 tablet 1   aspirin EC 81 MG tablet Take 1 tablet (81 mg total) daily by mouth. 100 tablet 1   atorvastatin (LIPITOR) 80 MG tablet Take 1 tablet (80 mg total) by mouth daily. 30 tablet 3   Blood Glucose Monitoring Suppl (ACCU-CHEK GUIDE) w/Device KIT 1 kit by Does not apply route in the morning, at noon, and at bedtime. Use to check blood sugar 3 times daily. 1 kit 0   Bromfenac Sodium (PROLENSA) 0.07 % SOLN Place 1 drop into both eyes  4 (four) times daily. 6 mL 0   carvedilol (COREG) 25 MG tablet TAKE 1 TABLET (25 MG TOTAL) BY MOUTH 2 (TWO) TIMES DAILY WITH A MEAL. 60 tablet 0   empagliflozin (JARDIANCE) 10 MG TABS tablet TAKE 1 TABLET (10 MG TOTAL) BY MOUTH DAILY. 30 tablet 4   glucose blood (ACCU-CHEK GUIDE) test strip Use to check blood sugar 3 times daily. 100 each 6   insulin aspart (NOVOLOG) 100 UNIT/ML injection Inject 28 Units into the skin 3 (three) times daily before meals. 140 mL 0   insulin glargine (LANTUS) 100 UNIT/ML Solostar Pen INJECT 58 UNITS INTO THE SKIN DAILY. 45 mL 6   Insulin Syringe-Needle U-100 31G X 15/64" 0.3 ML MISC USE TO INJECT NOVOLOG THREE TIMES DAILY 100 each 2   isosorbide mononitrate  (IMDUR) 60 MG 24 hr tablet TAKE 1 TABLET (60 MG TOTAL) BY MOUTH DAILY. 90 tablet 3   ketorolac (ACULAR) 0.5 % ophthalmic solution Place 1 drop into both eyes 4 (four) times daily. 10 mL 4   ketorolac (ACULAR) 0.5 % ophthalmic solution Place 1 drop into both eyes 4 (four) times daily. 5 mL 0   oxybutynin (DITROPAN-XL) 5 MG 24 hr tablet Take 1 tablet (5 mg total) by mouth at bedtime. 30 tablet 5   potassium chloride (KLOR-CON) 10 MEQ tablet TAKE 1 TABLET BY MOUTH EVERY DAY 90 tablet 1   prednisoLONE acetate (PRED FORTE) 1 % ophthalmic suspension Place 1 drop into both eyes 4 (four) times daily. 10 mL 0   TRUEPLUS PEN NEEDLES 32G X 4 MM MISC USE AS DIRECTED WITH INSULIN 100 each 0   Accu-Chek Softclix Lancets lancets Use to check blood sugar 3 times daily. 100 each 6   butalbital-acetaminophen-caffeine (FIORICET) 50-325-40 MG tablet Take 1 tablet by mouth every 6 (six) hours as needed for headache. (Patient not taking: No sig reported) 12 tablet 3   Insulin Syringe-Needle U-100 (BD INSULIN SYRINGE) 29G X 1/2" 0.3 ML MISC Use to inject Novolog TID. 100 each 2   Na Sulfate-K Sulfate-Mg Sulf 17.5-3.13-1.6 GM/177ML SOLN Suprep (no substitutions)-TAKE AS DIRECTED. 354 mL 0   nitroGLYCERIN (NITROSTAT) 0.4 MG SL tablet Place 1 tab under tongue for chest pain.  May repeat after 5 minutes x 2.  DO NOT TAKE MORE THAN 3 TABS DURING AN EPISODE OF CHEST PAIN (Patient not taking: No sig reported) 25 tablet 6   prednisoLONE acetate (PRED FORTE) 1 % ophthalmic suspension Place 1 drop into both eyes 4 (four) times daily. 15 mL 4   Current Facility-Administered Medications  Medication Dose Route Frequency Provider Last Rate Last Admin   0.9 %  sodium chloride infusion  500 mL Intravenous Once Mansouraty, Telford Nab., MD        Current Outpatient Medications:    amLODipine (NORVASC) 10 MG tablet, TAKE 1 TABLET (10 MG TOTAL) BY MOUTH DAILY., Disp: 90 tablet, Rfl: 1   aspirin EC 81 MG tablet, Take 1 tablet (81 mg  total) daily by mouth., Disp: 100 tablet, Rfl: 1   atorvastatin (LIPITOR) 80 MG tablet, Take 1 tablet (80 mg total) by mouth daily., Disp: 30 tablet, Rfl: 3   Blood Glucose Monitoring Suppl (ACCU-CHEK GUIDE) w/Device KIT, 1 kit by Does not apply route in the morning, at noon, and at bedtime. Use to check blood sugar 3 times daily., Disp: 1 kit, Rfl: 0   Bromfenac Sodium (PROLENSA) 0.07 % SOLN, Place 1 drop into both eyes 4 (four) times daily., Disp: 6  mL, Rfl: 0   carvedilol (COREG) 25 MG tablet, TAKE 1 TABLET (25 MG TOTAL) BY MOUTH 2 (TWO) TIMES DAILY WITH A MEAL., Disp: 60 tablet, Rfl: 0   empagliflozin (JARDIANCE) 10 MG TABS tablet, TAKE 1 TABLET (10 MG TOTAL) BY MOUTH DAILY., Disp: 30 tablet, Rfl: 4   glucose blood (ACCU-CHEK GUIDE) test strip, Use to check blood sugar 3 times daily., Disp: 100 each, Rfl: 6   insulin aspart (NOVOLOG) 100 UNIT/ML injection, Inject 28 Units into the skin 3 (three) times daily before meals., Disp: 140 mL, Rfl: 0   insulin glargine (LANTUS) 100 UNIT/ML Solostar Pen, INJECT 58 UNITS INTO THE SKIN DAILY., Disp: 45 mL, Rfl: 6   Insulin Syringe-Needle U-100 31G X 15/64" 0.3 ML MISC, USE TO INJECT NOVOLOG THREE TIMES DAILY, Disp: 100 each, Rfl: 2   isosorbide mononitrate (IMDUR) 60 MG 24 hr tablet, TAKE 1 TABLET (60 MG TOTAL) BY MOUTH DAILY., Disp: 90 tablet, Rfl: 3   ketorolac (ACULAR) 0.5 % ophthalmic solution, Place 1 drop into both eyes 4 (four) times daily., Disp: 10 mL, Rfl: 4   ketorolac (ACULAR) 0.5 % ophthalmic solution, Place 1 drop into both eyes 4 (four) times daily., Disp: 5 mL, Rfl: 0   oxybutynin (DITROPAN-XL) 5 MG 24 hr tablet, Take 1 tablet (5 mg total) by mouth at bedtime., Disp: 30 tablet, Rfl: 5   potassium chloride (KLOR-CON) 10 MEQ tablet, TAKE 1 TABLET BY MOUTH EVERY DAY, Disp: 90 tablet, Rfl: 1   prednisoLONE acetate (PRED FORTE) 1 % ophthalmic suspension, Place 1 drop into both eyes 4 (four) times daily., Disp: 10 mL, Rfl: 0   TRUEPLUS PEN NEEDLES  32G X 4 MM MISC, USE AS DIRECTED WITH INSULIN, Disp: 100 each, Rfl: 0   Accu-Chek Softclix Lancets lancets, Use to check blood sugar 3 times daily., Disp: 100 each, Rfl: 6   butalbital-acetaminophen-caffeine (FIORICET) 50-325-40 MG tablet, Take 1 tablet by mouth every 6 (six) hours as needed for headache. (Patient not taking: No sig reported), Disp: 12 tablet, Rfl: 3   Insulin Syringe-Needle U-100 (BD INSULIN SYRINGE) 29G X 1/2" 0.3 ML MISC, Use to inject Novolog TID., Disp: 100 each, Rfl: 2   Na Sulfate-K Sulfate-Mg Sulf 17.5-3.13-1.6 GM/177ML SOLN, Suprep (no substitutions)-TAKE AS DIRECTED., Disp: 354 mL, Rfl: 0   nitroGLYCERIN (NITROSTAT) 0.4 MG SL tablet, Place 1 tab under tongue for chest pain.  May repeat after 5 minutes x 2.  DO NOT TAKE MORE THAN 3 TABS DURING AN EPISODE OF CHEST PAIN (Patient not taking: No sig reported), Disp: 25 tablet, Rfl: 6   prednisoLONE acetate (PRED FORTE) 1 % ophthalmic suspension, Place 1 drop into both eyes 4 (four) times daily., Disp: 15 mL, Rfl: 4  Current Facility-Administered Medications:    0.9 %  sodium chloride infusion, 500 mL, Intravenous, Once, Mansouraty, Telford Nab., MD No Known Allergies Family History  Problem Relation Age of Onset   Heart disease Mother    Hypertension Mother    Diabetes Mother    Cancer Father        hodgkins lymphoma   Heart disease Sister        heart attack   Diabetes Sister    Colon cancer Brother 54   Diabetes Maternal Aunt    Diabetes Maternal Uncle    Cancer Other        parent   Diabetes Other        parent   Heart disease Other  parent   Hyperlipidemia Other        parent   Hypertension Other        parent   Arthritis Other        parent   Breast cancer Neg Hx    Colon polyps Neg Hx    Esophageal cancer Neg Hx    Rectal cancer Neg Hx    Stomach cancer Neg Hx    Social History   Socioeconomic History   Marital status: Single    Spouse name: Not on file   Number of children: 2   Years  of education: 12   Highest education level: Not on file  Occupational History   Occupation: retired  Tobacco Use   Smoking status: Never   Smokeless tobacco: Never  Vaping Use   Vaping Use: Never used  Substance and Sexual Activity   Alcohol use: Not Currently   Drug use: Yes    Types: Marijuana    Comment: every 2 weeks per pt   Sexual activity: Yes  Other Topics Concern   Not on file  Social History Narrative   Lives at home with her grandson.   Right-handed.   No daily caffeine use.   Social Determinants of Health   Financial Resource Strain: Not on file  Food Insecurity: Not on file  Transportation Needs: Not on file  Physical Activity: Not on file  Stress: Not on file  Social Connections: Not on file  Intimate Partner Violence: Not on file    Physical Exam: Vital signs in last 24 hours: @VSRANGES @   GEN: NAD EYE: Sclerae anicteric ENT: MMM CV: Non-tachycardic GI: Soft, NT/ND NEURO:  Alert & Oriented x 3  Lab Results: No results for input(s): WBC, HGB, HCT, PLT in the last 72 hours. BMET No results for input(s): NA, K, CL, CO2, GLUCOSE, BUN, CREATININE, CALCIUM in the last 72 hours. LFT No results for input(s): PROT, ALBUMIN, AST, ALT, ALKPHOS, BILITOT, BILIDIR, IBILI in the last 72 hours. PT/INR No results for input(s): LABPROT, INR in the last 72 hours.   Impression / Plan: This is a 60 y.o.female who presents for Colonoscopy for screening with prior polyps and FHx Colon Cancer.  The risks and benefits of endoscopic evaluation/treatment were discussed with the patient and/or family; these include but are not limited to the risk of perforation, infection, bleeding, missed lesions, lack of diagnosis, severe illness requiring hospitalization, as well as anesthesia and sedation related illnesses.  The patient's history has been reviewed, patient examined, no change in status, and deemed stable for procedure.  The patient and/or family is agreeable to  proceed.    Justice Britain, MD [facility] Advanced [facility]oscopy Office # [phone]

## 2021-01-13 ENCOUNTER — Telehealth: Payer: Self-pay

## 2021-01-13 ENCOUNTER — Telehealth: Payer: Self-pay | Admitting: *Deleted

## 2021-01-13 NOTE — Telephone Encounter (Signed)
  Follow up Call-  Call back number 01/09/2021  Post procedure Call Back phone  # [phone]  Permission to leave phone message Yes  Some recent data might be hidden     Patient questions: Message left to call us if necessary.  Second call.

## 2021-01-13 NOTE — Telephone Encounter (Signed)
Not home spoke with family member. Will call back latter.

## 2021-01-14 ENCOUNTER — Encounter: Payer: Self-pay | Admitting: Gastroenterology

## 2021-01-16 ENCOUNTER — Ambulatory Visit: Payer: Medicare Other | Admitting: Internal Medicine

## 2021-01-20 ENCOUNTER — Other Ambulatory Visit: Payer: Self-pay

## 2021-01-20 DIAGNOSIS — R300 Dysuria: Secondary | ICD-10-CM

## 2021-01-20 LAB — HEPATIC FUNCTION PANEL
ALT: 17 IU/L (ref 0–32)
AST: 15 IU/L (ref 0–40)
Albumin: 4.1 g/dL (ref 3.8–4.8)
Alkaline Phosphatase: 127 IU/L — ABNORMAL HIGH (ref 44–121)
Bilirubin Total: 0.7 mg/dL (ref 0.0–1.2)
Bilirubin, Direct: 0.16 mg/dL (ref 0.00–0.40)
Total Protein: 7.1 g/dL (ref 6.0–8.5)

## 2021-01-20 LAB — LIPID PANEL
Chol/HDL Ratio: 3.3 ratio (ref 0.0–4.4)
Cholesterol, Total: 137 mg/dL (ref 100–199)
HDL: 41 mg/dL (ref >39)
LDL Chol Calc (NIH): 72 mg/dL (ref 0–99)
Triglycerides: 136 mg/dL (ref 0–149)
VLDL Cholesterol Cal: 24 mg/dL (ref 5–40)

## 2021-01-23 ENCOUNTER — Encounter (INDEPENDENT_AMBULATORY_CARE_PROVIDER_SITE_OTHER): Payer: Medicare Other | Admitting: Ophthalmology

## 2021-01-25 MED FILL — Isosorbide Mononitrate Tab ER 24HR 60 MG: ORAL | 90 days supply | Qty: 90 | Fill #0 | Status: AC

## 2021-01-26 ENCOUNTER — Encounter (INDEPENDENT_AMBULATORY_CARE_PROVIDER_SITE_OTHER): Payer: Medicare Other | Admitting: Ophthalmology

## 2021-01-26 ENCOUNTER — Other Ambulatory Visit: Payer: Self-pay

## 2021-01-26 DIAGNOSIS — H43813 Vitreous degeneration, bilateral: Secondary | ICD-10-CM

## 2021-01-26 DIAGNOSIS — H35033 Hypertensive retinopathy, bilateral: Secondary | ICD-10-CM | POA: Diagnosis not present

## 2021-01-26 DIAGNOSIS — E113313 Type 2 diabetes mellitus with moderate nonproliferative diabetic retinopathy with macular edema, bilateral: Secondary | ICD-10-CM

## 2021-01-26 DIAGNOSIS — I10 Essential (primary) hypertension: Secondary | ICD-10-CM | POA: Diagnosis not present

## 2021-01-26 DIAGNOSIS — H35353 Cystoid macular degeneration, bilateral: Secondary | ICD-10-CM

## 2021-01-26 DIAGNOSIS — H3581 Retinal edema: Secondary | ICD-10-CM

## 2021-01-26 DIAGNOSIS — Z961 Presence of intraocular lens: Secondary | ICD-10-CM

## 2021-01-30 ENCOUNTER — Ambulatory Visit: Payer: Medicare Other | Attending: Internal Medicine | Admitting: Internal Medicine

## 2021-01-30 ENCOUNTER — Other Ambulatory Visit: Payer: Self-pay

## 2021-01-30 DIAGNOSIS — Z90722 Acquired absence of ovaries, bilateral: Secondary | ICD-10-CM | POA: Diagnosis not present

## 2021-01-30 DIAGNOSIS — I2510 Atherosclerotic heart disease of native coronary artery without angina pectoris: Secondary | ICD-10-CM | POA: Insufficient documentation

## 2021-01-30 DIAGNOSIS — Z79899 Other long term (current) drug therapy: Secondary | ICD-10-CM | POA: Diagnosis not present

## 2021-01-30 DIAGNOSIS — Z2821 Immunization not carried out because of patient refusal: Secondary | ICD-10-CM

## 2021-01-30 DIAGNOSIS — I119 Hypertensive heart disease without heart failure: Secondary | ICD-10-CM | POA: Insufficient documentation

## 2021-01-30 DIAGNOSIS — Z90710 Acquired absence of both cervix and uterus: Secondary | ICD-10-CM | POA: Diagnosis not present

## 2021-01-30 DIAGNOSIS — E11319 Type 2 diabetes mellitus with unspecified diabetic retinopathy without macular edema: Secondary | ICD-10-CM | POA: Insufficient documentation

## 2021-01-30 DIAGNOSIS — Z6841 Body Mass Index (BMI) 40.0 and over, adult: Secondary | ICD-10-CM | POA: Diagnosis not present

## 2021-01-30 DIAGNOSIS — D126 Benign neoplasm of colon, unspecified: Secondary | ICD-10-CM | POA: Insufficient documentation

## 2021-01-30 DIAGNOSIS — I10 Essential (primary) hypertension: Secondary | ICD-10-CM | POA: Diagnosis not present

## 2021-01-30 DIAGNOSIS — F1290 Cannabis use, unspecified, uncomplicated: Secondary | ICD-10-CM | POA: Insufficient documentation

## 2021-01-30 DIAGNOSIS — Z7982 Long term (current) use of aspirin: Secondary | ICD-10-CM | POA: Insufficient documentation

## 2021-01-30 DIAGNOSIS — Z794 Long term (current) use of insulin: Secondary | ICD-10-CM | POA: Insufficient documentation

## 2021-01-30 DIAGNOSIS — Z8249 Family history of ischemic heart disease and other diseases of the circulatory system: Secondary | ICD-10-CM | POA: Diagnosis not present

## 2021-01-30 DIAGNOSIS — E1169 Type 2 diabetes mellitus with other specified complication: Secondary | ICD-10-CM | POA: Diagnosis not present

## 2021-01-30 DIAGNOSIS — Z923 Personal history of irradiation: Secondary | ICD-10-CM | POA: Diagnosis not present

## 2021-01-30 LAB — GLUCOSE, POCT (MANUAL RESULT ENTRY): POC Glucose: 161 mg/dl — AB (ref 70–99)

## 2021-01-30 LAB — POCT GLYCOSYLATED HEMOGLOBIN (HGB A1C): HbA1c, POC (controlled diabetic range): 8.8 % — AB (ref 0.0–7.0)

## 2021-01-30 NOTE — Progress Notes (Signed)
Patient ID: Kimberly Frazier, female    DOB: 23-Apr-1961  MRN: [id number]  CC: chronic ds management  Subjective: Kimberly Frazier is a 60 y.o. female who presents for chronic ds management Her concerns today include:  Patient with history of DM with neuropathy and retinopathy, HTN with hypertensive heart disease, CAD (Coronary CT revealed occlusion in the mid to distal LAD, distal RCA.  Cardiology states that she will need CABG eventually), HL, obesity, endometrial CA s/p hysterectomy.   Had c-scope done by Dr. Rush Landmark.  A large 1 mm adenomatous polyp was removed.  She was told she will need repeat in 3 years.  HTN/CAD Currently taking: see medication list Med Adherence: [x]  Yes  Isosorbide (out x 2 days), Norvasc 10 mg, Lipitor, ASA and Coreg Medication side effects: []  Yes    [x]  No Adherence with salt restriction: []  Yes    []  No Home Monitoring?: []  Yes    [x]  No but does have monitor Monitoring Frequency:  Home BP results range:  SOB? []  Yes    [x]  No Chest Pain?: []  Yes    [x]  No Leg swelling?: []  Yes    [x]  No Headaches?: []  Yes    [x]  No Dizziness? []  Yes    [x]  No Comments:   DIABETES TYPE 2/Obesity Last A1C:   Results for orders placed or performed in visit on 01/30/21  POCT glucose (manual entry)  Result Value Ref Range   POC Glucose 161 (A) 70 - 99 mg/dl  POCT glycosylated hemoglobin (Hb A1C)  Result Value Ref Range   Hemoglobin A1C     HbA1c POC (<> result, manual entry)     HbA1c, POC (prediabetic range)     HbA1c, POC (controlled diabetic range) 8.8 (A) 0.0 - 7.0 %   A1C increased from 7.4 Med Adherence:  [x]  Yes -taking Jardiance, Lantus 58-60 daily.  Not taking the Novolog consistently Medication side effects:  []  Yes    []  No Home Monitoring?  [x]  Yes daily to every other day.  Did not bring log   Home glucose results range:does not recall Diet Adherence: turned down [facility] referral when called because she did not have the money up front that was needed. Feels  she is doing "better but not great.  I still drink 2 liters Coka Cola every other day."  Exercise: []  Yes    []  No Hypoglycemic episodes?: []  Yes    [x]  No Numbness of the feet? []  Yes    [x]  No Retinopathy hx? [x]  Yes    []  No Last eye exam:  Comments:   HM: declines flu shot and pneumonia.  Due for 2nd shingles shot.  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   History of radiation therapy 11/28/2018   Diabetes mellitus ([place]) 10/06/2018   Type 2 diabetes mellitus with hyperglycemia, with long-term current use of insulin ([place]) 10/06/2018   Adrenal adenoma, left 06/15/2018   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   Postmenopausal vaginal bleeding 06/15/2018   Hyperkalemia    Diabetic peripheral neuropathy ([place]) 09/03/2016  CAD (coronary artery disease) 04/10/2015   Severe obesity (BMI >= 40) ([place]) 09/20/2013   Hyperhydrosis disorder 09/20/2013   Hyperlipidemia 10/25/2012   Diabetes mellitus type 2, uncontrolled, with complications ([place]) 35/00/9381   Essential hypertension 07/26/2012     Current Outpatient Medications on File Prior to Visit  Medication Sig Dispense Refill   Accu-Chek Softclix Lancets lancets Use to check blood sugar 3 times daily. 100 each 6   amLODipine (NORVASC) 10 MG tablet TAKE 1 TABLET (10 MG TOTAL) BY MOUTH DAILY. 90 tablet 1   aspirin EC 81 MG tablet Take 1 tablet (81 mg total) daily by mouth. 100 tablet 1   atorvastatin (LIPITOR) 80 MG tablet Take 1 tablet (80 mg total) by mouth daily. 30 tablet 3    Blood Glucose Monitoring Suppl (ACCU-CHEK GUIDE) w/Device KIT 1 kit by Does not apply route in the morning, at noon, and at bedtime. Use to check blood sugar 3 times daily. 1 kit 0   Bromfenac Sodium (PROLENSA) 0.07 % SOLN Place 1 drop into both eyes 4 (four) times daily. 6 mL 0   butalbital-acetaminophen-caffeine (FIORICET) 50-325-40 MG tablet Take 1 tablet by mouth every 6 (six) hours as needed for headache. 12 tablet 3   carvedilol (COREG) 25 MG tablet TAKE 1 TABLET (25 MG TOTAL) BY MOUTH 2 (TWO) TIMES DAILY WITH A MEAL. 60 tablet 0   empagliflozin (JARDIANCE) 10 MG TABS tablet TAKE 1 TABLET (10 MG TOTAL) BY MOUTH DAILY. 30 tablet 4   glucose blood (ACCU-CHEK GUIDE) test strip Use to check blood sugar 3 times daily. 100 each 6   insulin aspart (NOVOLOG) 100 UNIT/ML injection Inject 28 Units into the skin 3 (three) times daily before meals. 140 mL 0   insulin glargine (LANTUS) 100 UNIT/ML Solostar Pen INJECT 58 UNITS INTO THE SKIN DAILY. 45 mL 6   Insulin Syringe-Needle U-100 (BD INSULIN SYRINGE) 29G X 1/2" 0.3 ML MISC Use to inject Novolog TID. 100 each 2   Insulin Syringe-Needle U-100 31G X 15/64" 0.3 ML MISC USE TO INJECT NOVOLOG THREE TIMES DAILY 100 each 2   isosorbide mononitrate (IMDUR) 60 MG 24 hr tablet TAKE 1 TABLET (60 MG TOTAL) BY MOUTH DAILY. 90 tablet 3   ketorolac (ACULAR) 0.5 % ophthalmic solution Place 1 drop into both eyes 4 (four) times daily. 10 mL 4   ketorolac (ACULAR) 0.5 % ophthalmic solution Place 1 drop into both eyes 4 (four) times daily. 5 mL 0   Na Sulfate-K Sulfate-Mg Sulf 17.5-3.13-1.6 GM/177ML SOLN Suprep (no substitutions)-TAKE AS DIRECTED. 354 mL 0   nitroGLYCERIN (NITROSTAT) 0.4 MG SL tablet Place 1 tab under tongue for chest pain.  May repeat after 5 minutes x 2.  DO NOT TAKE MORE THAN 3 TABS DURING AN EPISODE OF CHEST PAIN (Patient taking differently: Place 1 tab under tongue for chest pain.  May repeat after 5 minutes x 2.  DO NOT TAKE MORE THAN 3 TABS DURING AN  EPISODE OF CHEST PAIN) 25 tablet 6   oxybutynin (DITROPAN-XL) 5 MG 24 hr tablet Take 1 tablet (5 mg total) by mouth at bedtime. 30 tablet 5   potassium chloride (KLOR-CON) 10 MEQ tablet TAKE 1 TABLET BY MOUTH EVERY DAY 90 tablet 1   prednisoLONE acetate (PRED FORTE) 1 % ophthalmic suspension Place 1 drop into both eyes 4 (four) times daily. 15 mL 4   prednisoLONE acetate (PRED FORTE) 1 % ophthalmic suspension Place 1 drop into both eyes 4 (four) times daily. 10  mL 0   TRUEPLUS PEN NEEDLES 32G X 4 MM MISC USE AS DIRECTED WITH INSULIN 100 each 0   No current facility-administered medications on file prior to visit.    No Known Allergies  Social History   Socioeconomic History   Marital status: Single    Spouse name: Not on file   Number of children: 2   Years of education: 12   Highest education level: Not on file  Occupational History   Occupation: retired  Tobacco Use   Smoking status: Never   Smokeless tobacco: Never  Vaping Use   Vaping Use: Never used  Substance and Sexual Activity   Alcohol use: Not Currently   Drug use: Yes    Types: Marijuana    Comment: every 2 weeks per pt   Sexual activity: Yes  Other Topics Concern   Not on file  Social History Narrative   Lives at home with her grandson.   Right-handed.   No daily caffeine use.   Social Determinants of Health   Financial Resource Strain: Not on file  Food Insecurity: Not on file  Transportation Needs: Not on file  Physical Activity: Not on file  Stress: Not on file  Social Connections: Not on file  Intimate Partner Violence: Not on file    Family History  Problem Relation Age of Onset   Heart disease Mother    Hypertension Mother    Diabetes Mother    Cancer Father        hodgkins lymphoma   Heart disease Sister        heart attack   Diabetes Sister    Colon cancer Brother 33   Diabetes Maternal Aunt    Diabetes Maternal Uncle    Cancer Other        parent   Diabetes Other        parent    Heart disease Other        parent   Hyperlipidemia Other        parent   Hypertension Other        parent   Arthritis Other        parent   Breast cancer Neg Hx    Colon polyps Neg Hx    Esophageal cancer Neg Hx    Rectal cancer Neg Hx    Stomach cancer Neg Hx     Past Surgical History:  Procedure Laterality Date   BREAST BIOPSY  2012   benign   CARDIAC CATHETERIZATION N/A 02/06/2015   Procedure: Left Heart Cath and Coronary Angiography;  Surgeon: Larey Dresser, MD;  Location: [facility];  Service: Cardiovascular;  Laterality: N/A;   CATARACT EXTRACTION Bilateral OD: 03/07/19, OS: 02/28/19   Dr. Gershon Crane   CATARACT EXTRACTION, BILATERAL     COLONOSCOPY     normal exam in [place], [place] 10-12 years ago   EYE SURGERY Bilateral OD: 03/07/19, OS: 02/28/19   Cat Sx - Dr. Gershon Crane   HYSTEROSCOPY WITH D & C N/A 08/01/2018   Procedure: DILATATION AND CURETTAGE /HYSTEROSCOPY;  Surgeon: Mora Bellman, MD;  Location: [facility];  Service: Gynecology;  Laterality: N/A;   ROBOTIC ASSISTED TOTAL HYSTERECTOMY WITH BILATERAL SALPINGO OOPHERECTOMY N/A 11/28/2018   Procedure: XI ROBOTIC ASSISTED TOTAL HYSTERECTOMY WITH BILATERAL SALPINGO OOPHORECTOMY;  Surgeon: Everitt Amber, MD;  Location: [facility];  Service: Gynecology;  Laterality: N/A;   SENTINEL NODE BIOPSY N/A 11/28/2018   Procedure: SENTINEL NODE BIOPSY;  Surgeon: Everitt Amber,  MD;  Location: [facility];  Service: Gynecology;  Laterality: N/A;   UMBILICAL HERNIA REPAIR  child    ROS: Review of Systems Negative except as stated above  PHYSICAL EXAM: BP 134/84   Pulse 77   Resp 16   Wt 281 lb (127.5 kg)   SpO2 97%   BMI 48.23 kg/m   Wt Readings from Last 3 Encounters:  01/30/21 281 lb (127.5 kg)  01/09/21 286 lb (129.7 kg)  11/19/20 286 lb (129.7 kg)    Physical Exam  General appearance - alert, well appearing, morbidly obese older African-American female and in no distress Mental status - normal mood,  behavior, speech, dress, motor activity, and thought processes Mouth - mucous membranes moist, pharynx normal without lesions Neck - supple, no significant adenopathy Chest - clear to auscultation, no wheezes, rales or rhonchi, symmetric air entry Heart - normal rate, regular rhythm, normal S1, S2, no murmurs, rubs, clicks or gallops Extremities - peripheral pulses normal, no pedal edema, no clubbing or cyanosis  Depression screen [facility] 2/9 01/30/2021 09/16/2020 07/29/2020  Decreased Interest 3 1 3   Down, Depressed, Hopeless 3 1 1   PHQ - 2 Score 6 2 4   Altered sleeping 1 1 0  Tired, decreased energy 3 2 3   Change in appetite 1 0 0  Feeling bad or failure about yourself  1 2 0  Trouble concentrating 0 1 1  Moving slowly or fidgety/restless 0 1 0  Suicidal thoughts 0 1 0  PHQ-9 Score 12 10 8   Some recent data might be hidden    CMP Latest Ref Rng & Units 01/20/2021 03/24/2020 01/15/2020  Glucose 70 - 99 mg/dL - 348(H) 201(H)  BUN 8 - 23 mg/dL - 15 19  Creatinine 0.44 - 1.00 mg/dL - 1.23(H) 1.13(H)  Sodium 135 - 145 mmol/L - 137 142  Potassium 3.5 - 5.1 mmol/L - 3.0(L) 3.5  Chloride 98 - 111 mmol/L - 100 102  CO2 22 - 32 mmol/L - 28 26  Calcium 8.9 - 10.3 mg/dL - 8.8(L) 9.3  Total Protein 6.0 - 8.5 g/dL 7.1 - -  Total Bilirubin 0.0 - 1.2 mg/dL 0.7 - -  Alkaline Phos 44 - 121 IU/L 127(H) - -  AST 0 - 40 IU/L 15 - -  ALT 0 - 32 IU/L 17 - -   Lipid Panel     Component Value Date/Time   CHOL 137 01/20/2021 1020   TRIG 136 01/20/2021 1020   HDL 41 01/20/2021 1020   CHOLHDL 3.3 01/20/2021 1020   CHOLHDL 5 07/15/2015 1513   VLDL 44.0 (H) 07/15/2015 1513   LDLCALC 72 01/20/2021 1020   LDLDIRECT 93.0 07/15/2015 1513    CBC    Component Value Date/Time   WBC 10.9 (H) 03/24/2020 1427   RBC 4.50 03/24/2020 1427   HGB 12.7 03/24/2020 1427   HGB 13.4 06/15/2018 1056   HCT 39.4 03/24/2020 1427   HCT 40.3 06/15/2018 1056   PLT 247 03/24/2020 1427   PLT 229 06/15/2018 1056   MCV 87.6  03/24/2020 1427   MCV 88 06/15/2018 1056   MCH 28.2 03/24/2020 1427   MCHC 32.2 03/24/2020 1427   RDW 14.2 03/24/2020 1427   RDW 13.6 06/15/2018 1056   LYMPHSABS 1.0 12/30/2017 0812   MONOABS 1.3 (H) 12/30/2017 0812   EOSABS 0.1 12/30/2017 0812   BASOSABS 0.0 12/30/2017 0812    ASSESSMENT AND PLAN: 1. Type 2 diabetes mellitus with morbid obesity ([place]) Strongly encouraged her to  take the NovoLog with meals as prescribed. Continue current dose of Lantus insulin and Jardiance. Continue to encourage healthy eating habits.  Encouraged her to get rid of the sugary drinks. - POCT glucose (manual entry) - POCT glycosylated hemoglobin (Hb A1C)  2. Essential hypertension Close to goal.  Continue current medications and low-salt diet. - Basic Metabolic Panel  3. Coronary artery disease involving native coronary artery of native heart without angina pectoris Stable.  Continue carvedilol, aspirin and atorvastatin  4. Influenza vaccination declined Recommended.  Patient declined.  5. Pneumococcal vaccination declined by patient Recommended.  Patient declined.  6. Adenomatous polyp of colon, unspecified part of colon Due again in 3 years.     Patient was given the opportunity to ask questions.  Patient verbalized understanding of the plan and was able to repeat key elements of the plan.   Orders Placed This Encounter  Procedures   Basic Metabolic Panel   POCT glucose (manual entry)   POCT glycosylated hemoglobin (Hb A1C)     Requested Prescriptions    No prescriptions requested or ordered in this encounter    No follow-ups on file.  Karle Plumber, MD, FACP

## 2021-01-31 LAB — BASIC METABOLIC PANEL
BUN/Creatinine Ratio: 17 (ref 12–28)
BUN: 19 mg/dL (ref 8–27)
CO2: 25 mmol/L (ref 20–29)
Calcium: 9.2 mg/dL (ref 8.7–10.3)
Chloride: 104 mmol/L (ref 96–106)
Creatinine, Ser: 1.14 mg/dL — ABNORMAL HIGH (ref 0.57–1.00)
Glucose: 117 mg/dL — ABNORMAL HIGH (ref 65–99)
Potassium: 3.7 mmol/L (ref 3.5–5.2)
Sodium: 142 mmol/L (ref 134–144)
eGFR: 54 mL/min/{1.73_m2} — ABNORMAL LOW (ref >59)

## 2021-01-31 NOTE — Progress Notes (Signed)
Let patient know that her kidney function is not 100% but stable compared to when last checked in October of last year.  Avoid taking over-the-counter pain medications like ibuprofen, Aleve, Advil, Motrin and Naprosyn as these can make kidney function worse.

## 2021-02-04 ENCOUNTER — Telehealth: Payer: Self-pay

## 2021-02-04 NOTE — Telephone Encounter (Signed)
Contacted pt to go over lab results pt is aware and doesn't have any questions or concerns 

## 2021-02-06 ENCOUNTER — Other Ambulatory Visit: Payer: Self-pay

## 2021-02-06 MED FILL — Insulin Glargine Soln Pen-Injector 100 Unit/ML: SUBCUTANEOUS | 77 days supply | Qty: 45 | Fill #3 | Status: AC

## 2021-02-06 NOTE — Progress Notes (Deleted)
Cardiology Clinic Note   Patient Name: Kimberly Frazier Date of Encounter: 02/06/2021  Primary Care Provider:  Ladell Pier, MD Primary Cardiologist:  Buford Dresser, MD  Patient Profile    Kimberly Frazier 60 year old female presents the clinic today for follow-up evaluation of her shortness of breath and essential hypertension.  Past Medical History    Past Medical History:  Diagnosis Date   Adrenal adenoma, left    Arthritis    Cancer ([place])    CKD (chronic kidney disease), stage II    Coronary artery disease 07-28-2018 followed [facility]  currently due to no insurance   per cardiac cath 02-06-2015 (positive mild lateral ishcemia on stress test)--- dLAD 80%,  mLAD 40%,  mPDA 80% (small vessel),  ostial D1 70%,  CFx with lumial irregarlities-- medical management   Depression    Diabetic neuropathy ([place])    Diabetic retinopathy ([place])    NPDR OU   Headache    History of Bell's palsy 07/2011   per pt residual facial pain on left side occasionally   History of cancer of vagina 1999   per pt completed radiation and chemo   History of sepsis 12/30/2017   positive blood culter fro E.coli   Hyperlipidemia    Hypertension    Hypertensive retinopathy    OU   Hypokalemia    Insulin dependent type 2 diabetes mellitus, uncontrolled ([place])    followed by pcp---  A1c was 11.7 on 06-15-2018 in epic   Myocardial infarction [facility])    10 yrs ago?   Nocturia    Peripheral neuropathy    PMB (postmenopausal bleeding)    Wears dentures    upper   Wears glasses    Past Surgical History:  Procedure Laterality Date   BREAST BIOPSY  2012   benign   CARDIAC CATHETERIZATION N/A 02/06/2015   Procedure: Left Heart Cath and Coronary Angiography;  Surgeon: Larey Dresser, MD;  Location: [facility];  Service: Cardiovascular;  Laterality: N/A;   CATARACT EXTRACTION Bilateral OD: 03/07/19, OS: 02/28/19   Dr. Gershon Crane   CATARACT EXTRACTION, BILATERAL      COLONOSCOPY     normal exam in [place], [place] 10-12 years ago   EYE SURGERY Bilateral OD: 03/07/19, OS: 02/28/19   Cat Sx - Dr. Gershon Crane   HYSTEROSCOPY WITH D & C N/A 08/01/2018   Procedure: DILATATION AND CURETTAGE /HYSTEROSCOPY;  Surgeon: Mora Bellman, MD;  Location: [facility];  Service: Gynecology;  Laterality: N/A;   ROBOTIC ASSISTED TOTAL HYSTERECTOMY WITH BILATERAL SALPINGO OOPHERECTOMY N/A 11/28/2018   Procedure: XI ROBOTIC ASSISTED TOTAL HYSTERECTOMY WITH BILATERAL SALPINGO OOPHORECTOMY;  Surgeon: Everitt Amber, MD;  Location: [facility];  Service: Gynecology;  Laterality: N/A;   SENTINEL NODE BIOPSY N/A 11/28/2018   Procedure: SENTINEL NODE BIOPSY;  Surgeon: Everitt Amber, MD;  Location: [facility];  Service: Gynecology;  Laterality: N/A;   UMBILICAL HERNIA REPAIR  child    Allergies  No Known Allergies  History of Present Illness    [name] has a PMH of coronary artery disease, hypertension, hypercholesterolemia, DM2 on insulin, and obesity.   She was seen by Dr. Harrell Gave on 07/02/2020.  During that time she reported she had completed her antibiotics but was not yet back to her baseline.  She still had increased phlegm and shortness of breath.  She denied fever and chills.   She reported having an episode 3 weeks ago where she was in the store  with her daughter.  She returned to her car, felt that, and briefly lost consciousness.  She reported she vomited on herself.  She denied episodes of confusion surrounding the event.  She reported that she went to bed immediately following the event that fell asleep.  She reported occasional episodes of shortness of breath.  She also reported that she had a lack of energy.  She noted that she did have exposure to secondhand smoke daily with her brother smoking.  Her blood pressure was elevated and she reported that she does not check her blood pressure at home.  She had not taken her a.m. antihypertensive medications.  When her  medications were reviewed she reported that she took her Jardiance, aspirin, potassium, carvedilol in the morning and several hours later will take her amlodipine, atorvastatin, and Imdur.  She had only been taking her carvedilol 1 time per day.  She was educated about carvedilol being twice daily medication.  She denied chest pain, PND, orthopnea, lower extremity swelling, palpitations, and unexplained weight gain.   She presented to the clinic 10/08/20 for follow-up evaluation stated she felt well.  She reported that she had been having much less phlegm and had not been noticing chest discomfort.  She had noticed some lower extremity leg pain that improved with ambulation.  She reported that her brother still smoked in her home.  We reviewed her coronary CTA and her previous echocardiogram.  We also reviewed her last lipid panel.  We reviewed the importance of heart healthy low-sodium high-fiber diet.  She reported that she had been on atorvastatin 40 for quite some time.  I increased her atorvastatin to 80 mg daily, planned repeat her fasting lipids and LFTs in 8 weeks, asked her to monitor blood pressure and keep a log.  We planned follow-up with Dr. Harrell Gave in 4 to 6 months.   She presents to the clinic today for follow up evaluation and states***  Today she denies chest pain, shortness of breath, lower extremity edema, fatigue, palpitations, melena, hematuria, hemoptysis, diaphoresis, weakness, presyncope, syncope, orthopnea, and PND.  Home Medications    Prior to Admission medications   Medication Sig Start Date End Date Taking? Authorizing Provider  Accu-Chek Softclix Lancets lancets Use to check blood sugar 3 times daily. 08/27/19   Ladell Pier, MD  amLODipine (NORVASC) 10 MG tablet TAKE 1 TABLET (10 MG TOTAL) BY MOUTH DAILY. 07/29/20 07/29/21  Ladell Pier, MD  aspirin EC 81 MG tablet Take 1 tablet (81 mg total) daily by mouth. 04/18/17   Ladell Pier, MD  atorvastatin  (LIPITOR) 80 MG tablet Take 1 tablet (80 mg total) by mouth daily. 10/08/20 03/01/21  Deberah Pelton, NP  Blood Glucose Monitoring Suppl (ACCU-CHEK GUIDE) w/Device KIT 1 kit by Does not apply route in the morning, at noon, and at bedtime. Use to check blood sugar 3 times daily. 08/27/19   Ladell Pier, MD  Bromfenac Sodium (PROLENSA) 0.07 % SOLN Place 1 drop into both eyes 4 (four) times daily. 03/23/19   Bernarda Caffey, MD  butalbital-acetaminophen-caffeine (FIORICET) [phone] MG tablet Take 1 tablet by mouth every 6 (six) hours as needed for headache. 12/21/19   Marcial Pacas, MD  carvedilol (COREG) 25 MG tablet TAKE 1 TABLET (25 MG TOTAL) BY MOUTH 2 (TWO) TIMES DAILY WITH A MEAL. 12/29/20 12/29/21  Ladell Pier, MD  empagliflozin (JARDIANCE) 10 MG TABS tablet TAKE 1 TABLET (10 MG TOTAL) BY MOUTH DAILY. 11/04/20 11/04/21  Wynetta Emery,  Dalbert Batman, MD  glucose blood (ACCU-CHEK GUIDE) test strip Use to check blood sugar 3 times daily. 08/27/19   Ladell Pier, MD  insulin aspart (NOVOLOG) 100 UNIT/ML injection Inject 28 Units into the skin 3 (three) times daily before meals. 12/29/20   Ladell Pier, MD  insulin glargine (LANTUS) 100 UNIT/ML Solostar Pen INJECT 58 UNITS INTO THE SKIN DAILY. 03/28/20 03/28/21  Ladell Pier, MD  Insulin Syringe-Needle U-100 (BD INSULIN SYRINGE) 29G X 1/2" 0.3 ML MISC Use to inject Novolog TID. 05/07/20   Ladell Pier, MD  Insulin Syringe-Needle U-100 31G X 15/64" 0.3 ML MISC USE TO INJECT NOVOLOG THREE TIMES DAILY 05/07/20 05/07/21  Ladell Pier, MD  isosorbide mononitrate (IMDUR) 60 MG 24 hr tablet TAKE 1 TABLET (60 MG TOTAL) BY MOUTH DAILY. 02/14/20 04/30/21  Buford Dresser, MD  ketorolac (ACULAR) 0.5 % ophthalmic solution Place 1 drop into both eyes 4 (four) times daily. 09/23/20 09/23/21  Bernarda Caffey, MD  ketorolac (ACULAR) 0.5 % ophthalmic solution Place 1 drop into both eyes 4 (four) times daily. 12/29/20 12/29/21  Bernarda Caffey, MD  Na Sulfate-K  Sulfate-Mg Sulf 17.5-3.13-1.6 GM/177ML SOLN Suprep (no substitutions)-TAKE AS DIRECTED. 11/19/20   Mansouraty, Telford Nab., MD  nitroGLYCERIN (NITROSTAT) 0.4 MG SL tablet Place 1 tab under tongue for chest pain.  May repeat after 5 minutes x 2.  DO NOT TAKE MORE THAN 3 TABS DURING AN EPISODE OF CHEST PAIN Patient taking differently: Place 1 tab under tongue for chest pain.  May repeat after 5 minutes x 2.  DO NOT TAKE MORE THAN 3 TABS DURING AN EPISODE OF CHEST PAIN 09/18/18   Buford Dresser, MD  oxybutynin (DITROPAN-XL) 5 MG 24 hr tablet Take 1 tablet (5 mg total) by mouth at bedtime. 09/16/20   Ladell Pier, MD  potassium chloride (KLOR-CON) 10 MEQ tablet TAKE 1 TABLET BY MOUTH EVERY DAY 01/07/21   Ladell Pier, MD  prednisoLONE acetate (PRED FORTE) 1 % ophthalmic suspension Place 1 drop into both eyes 4 (four) times daily. 09/23/20   Bernarda Caffey, MD  prednisoLONE acetate (PRED FORTE) 1 % ophthalmic suspension Place 1 drop into both eyes 4 (four) times daily. 12/29/20   Bernarda Caffey, MD  TRUEPLUS PEN NEEDLES 32G X 4 MM MISC USE AS DIRECTED WITH INSULIN 10/30/19   Ladell Pier, MD    Family History    Family History  Problem Relation Age of Onset   Heart disease Mother    Hypertension Mother    Diabetes Mother    Cancer Father        hodgkins lymphoma   Heart disease Sister        heart attack   Diabetes Sister    Colon cancer Brother 53   Diabetes Maternal Aunt    Diabetes Maternal Uncle    Cancer Other        parent   Diabetes Other        parent   Heart disease Other        parent   Hyperlipidemia Other        parent   Hypertension Other        parent   Arthritis Other        parent   Breast cancer Neg Hx    Colon polyps Neg Hx    Esophageal cancer Neg Hx    Rectal cancer Neg Hx    Stomach cancer Neg Hx    She indicated  that her mother is deceased. She indicated that her father is deceased. She indicated that her sister is alive. She indicated that  the status of her brother is unknown. She indicated that the status of her maternal aunt is unknown. She indicated that the status of her maternal uncle is unknown. She indicated that the status of her neg hx is unknown. She indicated that the status of her other is unknown.  Social History    Social History   Socioeconomic History   Marital status: Single    Spouse name: Not on file   Number of children: 2   Years of education: 12   Highest education level: Not on file  Occupational History   Occupation: retired  Tobacco Use   Smoking status: Never   Smokeless tobacco: Never  Vaping Use   Vaping Use: Never used  Substance and Sexual Activity   Alcohol use: Not Currently   Drug use: Yes    Types: Marijuana    Comment: every 2 weeks per pt   Sexual activity: Yes  Other Topics Concern   Not on file  Social History Narrative   Lives at home with her grandson.   Right-handed.   No daily caffeine use.   Social Determinants of Health   Financial Resource Strain: Not on file  Food Insecurity: Not on file  Transportation Needs: Not on file  Physical Activity: Not on file  Stress: Not on file  Social Connections: Not on file  Intimate Partner Violence: Not on file     Review of Systems    General:  No chills, fever, night sweats or weight changes.  Cardiovascular:  No chest pain, dyspnea on exertion, edema, orthopnea, palpitations, paroxysmal nocturnal dyspnea. Dermatological: No rash, lesions/masses Respiratory: No cough, dyspnea Urologic: No hematuria, dysuria Abdominal:   No nausea, vomiting, diarrhea, bright red blood per rectum, melena, or hematemesis Neurologic:  No visual changes, wkns, changes in mental status. All other systems reviewed and are otherwise negative except as noted above.  Physical Exam    VS:  There were no vitals taken for this visit. , BMI There is no height or weight on file to calculate BMI. GEN: Well nourished, well developed, in no acute  distress. HEENT: normal. Neck: Supple, no JVD, carotid bruits, or masses. Cardiac: RRR, no murmurs, rubs, or gallops. No clubbing, cyanosis, edema.  Radials/DP/PT 2+ and equal bilaterally.  Respiratory:  Respirations regular and unlabored, clear to auscultation bilaterally. GI: Soft, nontender, nondistended, BS + x 4. MS: no deformity or atrophy. Skin: warm and dry, no rash. Neuro:  Strength and sensation are intact. Psych: Normal affect.  Accessory Clinical Findings    Recent Labs: 03/24/2020: B Natriuretic Peptide 64.4; Hemoglobin 12.7; Platelets 247 01/20/2021: ALT 17 01/30/2021: BUN 19; Creatinine, Ser 1.14; Potassium 3.7; Sodium 142   Recent Lipid Panel    Component Value Date/Time   CHOL 137 01/20/2021 1020   TRIG 136 01/20/2021 1020   HDL 41 01/20/2021 1020   CHOLHDL 3.3 01/20/2021 1020   CHOLHDL 5 07/15/2015 1513   VLDL 44.0 (H) 07/15/2015 1513   LDLCALC 72 01/20/2021 1020   LDLDIRECT 93.0 07/15/2015 1513    ECG personally reviewed by me today- *** - No acute changes  ECG personally reviewed by me today-none today.   Echocardiogram 04/21/2020 1. Left ventricular ejection fraction, by estimation, is 50 to 55%. The  left ventricle has low normal function. The left ventricle demonstrates  regional wall motion abnormalities (see scoring diagram/findings  for  description). Left ventricular diastolic   parameters are consistent with Grade I diastolic dysfunction (impaired  relaxation). Elevated left atrial pressure.   2. Right ventricular systolic function is normal. The right ventricular  size is normal.   3. Large pericardial effusion. The pericardial effusion is  circumferential. There is no evidence of cardiac tamponade. Moderate  pleural effusion in the left lateral region.   4. The mitral valve is normal in structure. Mild mitral valve  regurgitation. No evidence of mitral stenosis.   5. The aortic valve is normal in structure. Aortic valve regurgitation is  not  visualized. No aortic stenosis is present.   6. Aortic dilatation noted. There is mild dilatation of the ascending  aorta, measuring 40 mm.   7. The inferior vena cava is normal in size with greater than 50%  respiratory variability, suggesting right atrial pressure of 3 mmHg.   Comparison(s): Unchanged pericardial effusion from 09/06/2018.    CT coronary 09/29/19 Coronary calcium score: The patient's coronary artery calcium score is 350, which places the patient in the 97th percentile.   Coronary arteries: Normal coronary origins.  Right dominance.   Right Coronary Artery: Dominant. Mild 25-49% non-calcified plaque of the mid-vessel (CADRADS2).   Left Main Coronary Artery: Eccentric calcification with minimal 1-24% mixed stenosis (CADRADS1). Bifurcates into the LAD and LCx vessels.   Left Anterior Descending Coronary Artery: The LAD does not reach the apex and may be occluded in the distal portion. There is moderate 50-69% (CADRADS3) mid-vessel non-calcified stenosis.   Left Circumflex Artery: Minimal distal 1-24% diffuse non-calcified stenosis. Smaller distal OM1 branch has calcified plaque with mild to moderate stenosis.   Aorta: Normal size, 37 mm at the mid ascending aorta (level of the PA bifurcation) measured double oblique. No calcifications. No dissection.   Aortic Valve: Trileaflet.  No calcifications.   Other findings:   Normal pulmonary vein drainage into the left atrium.   Normal left atrial appendage without a thrombus.   Dilated main pulmonary artery measuring 32 mm, suggestive of pulmonary hypertension.   Moderate sized circumferential pericardial effusion.   IMPRESSION: 1. Moderate non-calcified mid-LAD stenosis, possible distal LAD occlusion, CADRADS = 3. CT FFR will be performed and reported separately.   2. Coronary calcium score of 350. This was 97th percentile for age and sex matched control.   3. Normal coronary origin with right dominance.    4.  Moderate-sized circumferential pericardial effusion   5. Dilated main pulmonary artery to 32 mm, suggestive of pulmonary hypertension   6.  See radiology findings regarding lung parenchymal changes      Assessment & Plan   1.  Essential hypertension-BP today ***132/84.  Continues to be well-controlled at home. Continue carvedilol, amlodipine, Imdur Heart healthy low-sodium diet-salty 6 given Increase physical activity as tolerated Maintain blood pressure log   Coronary artery disease-no chest pain today and denies recent anginal events.   Coronary CTA 09/29/2019 showed RCA 25-49 percent plaque, LAD 1-24% and 50-69%, left circumflex 1-24%.  Details above. Continue amlodipine, Imdur, nitroglycerin, aspirin, carvedilol Heart healthy low-sodium diet-salty 6 given Increase physical activity as tolerated   Shortness of breath- Denies further episodes.  Only occasional cough, and stable breathing.  Previously prescribed azithromycin and noticed only slight improvement.  Felt to be related to secondhand smoke. Follows with PCP   Hyperlipidemia- 01/20/2021: Cholesterol, Total 137; HDL 41; LDL Chol Calc (NIH) 72; Triglycerides 136 Continue atorvastatin Heart healthy low-sodium high-fiber diet Increase physical activity as tolerated  Pericardial effusion- denies symptoms.  Previous ESR normal, CRP elevated. Continue to monitor.   Disposition: Follow-up with Dr. Harrell Gave in  6-9 months.   Jossie Ng. Mahir Prabhakar NP-C    02/06/2021, 7:12 AM [place] [address] Office [phone] Fax [phone]  Notice: This dictation was prepared with Dragon dictation along with smaller phrase technology. Any transcriptional errors that result from this process are unintentional and may not be corrected upon review.  I spent***minutes examining this patient, reviewing medications, and using patient centered shared decision making involving her cardiac  care.  Prior to her visit I spent greater than 20 minutes reviewing her past medical history,  medications, and prior cardiac tests.

## 2021-02-09 ENCOUNTER — Ambulatory Visit: Payer: Medicare Other | Admitting: General Practice

## 2021-02-19 NOTE — Progress Notes (Signed)
[facility] Note  02/23/2021     CHIEF COMPLAINT Patient presents for Retina Follow Up   HISTORY OF PRESENT ILLNESS: Kimberly Frazier is a 60 y.o. female who presents to the clinic today for:   HPI     Retina Follow Up   Patient presents with  Wet AMD.  In both eyes.  This started 4 weeks ago.  I, the attending physician,  performed the HPI with the patient and updated documentation appropriately.        Comments   Patient here for 4 weeks retina follow up for exu ARMD OU. Patient states vision doing ok. No eye pain this time.       Last edited by Bernarda Caffey, MD on 02/23/2021  8:38 AM.    Pt had injections with Dr. Zigmund Daniel in August, she states vision is stable, BG and BP numbers are okay   Referring physician: Ladell Pier, MD [place],  [place] [id number]  HISTORICAL INFORMATION:   Selected notes from the MEDICAL RECORD NUMBER Referred by Dr. Rutherford Guys for concern of CME   CURRENT MEDICATIONS: Current Outpatient Medications (Ophthalmic Drugs)  Medication Sig   Bromfenac Sodium (PROLENSA) 0.07 % SOLN Place 1 drop into both eyes 4 (four) times daily.   ketorolac (ACULAR) 0.5 % ophthalmic solution Place 1 drop into both eyes 4 (four) times daily.   ketorolac (ACULAR) 0.5 % ophthalmic solution Place 1 drop into both eyes 4 (four) times daily.   prednisoLONE acetate (PRED FORTE) 1 % ophthalmic suspension Place 1 drop into both eyes 4 (four) times daily.   prednisoLONE acetate (PRED FORTE) 1 % ophthalmic suspension Place 1 drop into both eyes 4 (four) times daily.   No current facility-administered medications for this visit. (Ophthalmic Drugs)   Current Outpatient Medications (Other)  Medication Sig   Accu-Chek Softclix Lancets lancets Use to check blood sugar 3 times daily.   amLODipine (NORVASC) 10 MG tablet TAKE 1 TABLET (10 MG TOTAL) BY MOUTH DAILY.   aspirin EC 81 MG tablet Take 1 tablet (81 mg total) daily by  mouth.   atorvastatin (LIPITOR) 80 MG tablet Take 1 tablet (80 mg total) by mouth daily.   Blood Glucose Monitoring Suppl (ACCU-CHEK GUIDE) w/Device KIT 1 kit by Does not apply route in the morning, at noon, and at bedtime. Use to check blood sugar 3 times daily.   butalbital-acetaminophen-caffeine (FIORICET) 50-325-40 MG tablet Take 1 tablet by mouth every 6 (six) hours as needed for headache.   carvedilol (COREG) 25 MG tablet TAKE 1 TABLET (25 MG TOTAL) BY MOUTH 2 (TWO) TIMES DAILY WITH A MEAL.   empagliflozin (JARDIANCE) 10 MG TABS tablet TAKE 1 TABLET (10 MG TOTAL) BY MOUTH DAILY.   glucose blood (ACCU-CHEK GUIDE) test strip Use to check blood sugar 3 times daily.   insulin aspart (NOVOLOG) 100 UNIT/ML injection Inject 28 Units into the skin 3 (three) times daily before meals.   insulin glargine (LANTUS) 100 UNIT/ML Solostar Pen INJECT 58 UNITS INTO THE SKIN DAILY.   Insulin Syringe-Needle U-100 (BD INSULIN SYRINGE) 29G X 1/2" 0.3 ML MISC Use to inject Novolog TID.   Insulin Syringe-Needle U-100 31G X 15/64" 0.3 ML MISC USE TO INJECT NOVOLOG THREE TIMES DAILY   isosorbide mononitrate (IMDUR) 60 MG 24 hr tablet TAKE 1 TABLET (60 MG TOTAL) BY MOUTH DAILY.   Na Sulfate-K Sulfate-Mg Sulf 17.5-3.13-1.6 GM/177ML SOLN Suprep (no substitutions)-TAKE AS DIRECTED.  nitroGLYCERIN (NITROSTAT) 0.4 MG SL tablet Place 1 tab under tongue for chest pain.  May repeat after 5 minutes x 2.  DO NOT TAKE MORE THAN 3 TABS DURING AN EPISODE OF CHEST PAIN (Patient taking differently: Place 1 tab under tongue for chest pain.  May repeat after 5 minutes x 2.  DO NOT TAKE MORE THAN 3 TABS DURING AN EPISODE OF CHEST PAIN)   oxybutynin (DITROPAN-XL) 5 MG 24 hr tablet Take 1 tablet (5 mg total) by mouth at bedtime.   potassium chloride (KLOR-CON) 10 MEQ tablet TAKE 1 TABLET BY MOUTH EVERY DAY   TRUEPLUS PEN NEEDLES 32G X 4 MM MISC USE AS DIRECTED WITH INSULIN   No current facility-administered medications for this visit.  (Other)   REVIEW OF SYSTEMS: ROS   Positive for: Neurological, Endocrine, Cardiovascular, Eyes Negative for: Constitutional, Gastrointestinal, Skin, Genitourinary, Musculoskeletal, HENT, Respiratory, Psychiatric, Allergic/Imm, Heme/Lymph Last edited by Theodore Demark, COA on 02/23/2021  8:07 AM.     ALLERGIES No Known Allergies  PAST MEDICAL HISTORY Past Medical History:  Diagnosis Date   Adrenal adenoma, left    Arthritis    Cancer ([place])    CKD (chronic kidney disease), stage II    Coronary artery disease 07-28-2018 followed [facility]  currently due to no insurance   per cardiac cath 02-06-2015 (positive mild lateral ishcemia on stress test)--- dLAD 80%,  mLAD 40%,  mPDA 80% (small vessel),  ostial D1 70%,  CFx with lumial irregarlities-- medical management   Depression    Diabetic neuropathy ([place])    Diabetic retinopathy ([place])    NPDR OU   Headache    History of Bell's palsy 07/2011   per pt residual facial pain on left side occasionally   History of cancer of vagina 1999   per pt completed radiation and chemo   History of sepsis 12/30/2017   positive blood culter fro E.coli   Hyperlipidemia    Hypertension    Hypertensive retinopathy    OU   Hypokalemia    Insulin dependent type 2 diabetes mellitus, uncontrolled ([place])    followed by pcp---  A1c was 11.7 on 06-15-2018 in epic   Myocardial infarction [facility])    10 yrs ago?   Nocturia    Peripheral neuropathy    PMB (postmenopausal bleeding)    Wears dentures    upper   Wears glasses    Past Surgical History:  Procedure Laterality Date   BREAST BIOPSY  2012   benign   CARDIAC CATHETERIZATION N/A 02/06/2015   Procedure: Left Heart Cath and Coronary Angiography;  Surgeon: Larey Dresser, MD;  Location: [place] [facility];  Service: Cardiovascular;  Laterality: N/A;   CATARACT EXTRACTION Bilateral OD: 03/07/19, OS: 02/28/19   Dr. Gershon Crane   CATARACT EXTRACTION, BILATERAL     COLONOSCOPY      normal exam in [place], [place] 10-12 years ago   EYE SURGERY Bilateral OD: 03/07/19, OS: 02/28/19   Cat Sx - Dr. Gershon Crane   HYSTEROSCOPY WITH D & C N/A 08/01/2018   Procedure: DILATATION AND CURETTAGE /HYSTEROSCOPY;  Surgeon: Mora Bellman, MD;  Location: [place];  Service: Gynecology;  Laterality: N/A;   ROBOTIC ASSISTED TOTAL HYSTERECTOMY WITH BILATERAL SALPINGO OOPHERECTOMY N/A 11/28/2018   Procedure: XI ROBOTIC ASSISTED TOTAL HYSTERECTOMY WITH BILATERAL SALPINGO OOPHORECTOMY;  Surgeon: Everitt Amber, MD;  Location: [facility];  Service: Gynecology;  Laterality: N/A;   SENTINEL NODE BIOPSY N/A 11/28/2018   Procedure:  SENTINEL NODE BIOPSY;  Surgeon: Everitt Amber, MD;  Location: [facility];  Service: Gynecology;  Laterality: N/A;   UMBILICAL HERNIA REPAIR  child    FAMILY HISTORY Family History  Problem Relation Age of Onset   Heart disease Mother    Hypertension Mother    Diabetes Mother    Cancer Father        hodgkins lymphoma   Heart disease Sister        heart attack   Diabetes Sister    Colon cancer Brother 10   Diabetes Maternal Aunt    Diabetes Maternal Uncle    Cancer Other        parent   Diabetes Other        parent   Heart disease Other        parent   Hyperlipidemia Other        parent   Hypertension Other        parent   Arthritis Other        parent   Breast cancer Neg Hx    Colon polyps Neg Hx    Esophageal cancer Neg Hx    Rectal cancer Neg Hx    Stomach cancer Neg Hx     SOCIAL HISTORY Social History   Tobacco Use   Smoking status: Never   Smokeless tobacco: Never  Vaping Use   Vaping Use: Never used  Substance Use Topics   Alcohol use: Not Currently   Drug use: Yes    Types: Marijuana    Comment: every 2 weeks per pt       OPHTHALMIC EXAM:  Base Eye Exam     Visual Acuity (Snellen - Linear)       Right Left   Dist [place] 20/25 20/350 -1   Dist ph  20/25 +2 20/250 -2         Tonometry (Tonopen, 8:04 AM)        Right Left   Pressure 20 22         Pupils       Dark Light Shape React APD   Right 4 3 Round Brisk None   Left 4 3 Round Brisk None         Visual Fields (Counting fingers)       Left Right    Full Full         Extraocular Movement       Right Left    Full Full         Neuro/Psych     Oriented x3: Yes   Mood/Affect: Normal         Dilation     Both eyes: 1.0% Mydriacyl, 2.5% Phenylephrine @ 8:04 AM           Slit Lamp and Fundus Exam     Slit Lamp Exam       Right Left   Lids/Lashes Dermatochalasis - upper lid Dermatochalasis - upper lid   Conjunctiva/Sclera Mild Melanosis Mild Melanosis   Cornea 1+ Punctate epithelial erosions 2+ central punctate epithelial erosions, decreased TBUT, mild tear film debris, well healed temporal cataract wounds   Anterior Chamber Deep, 0.5+cell/pigment Deep, 0.5+cell/pigment   Iris Round and dilated, No NVI Round and dilated, No NVI   Lens PC IOL in good position with trace PCO PC IOL in good position with 1+PCO   Vitreous Vitreous syneresis Vitreous syneresis, Posterior vitreous detachment, vitreous condensations         Fundus Exam  Right Left   Disc Pallor, Sharp rim Compact, Pink and Sharp, temporal PPA   C/D Ratio 0.1 0.1   Macula blunted foveal reflex, +central edema - persistent, scattered Microaneurysms, focal exudates inferior to fovea -- improved, focal DBH nasal macula Blunted foveal reflex, central edema - persistent, +exudates/MA/IRH--greatest temporal macula   Vessels attenuated, Tortuous attenuated, Tortuous   Periphery Attached, scattered drusen, scattered DBH  Attached, scattered DBH greatest posteriorly, mild peripheral drusen           IMAGING AND PROCEDURES  Imaging and Procedures for @TODAY @  OCT, Retina - OU - Both Eyes       Right Eye Quality was good. Central Foveal Thickness: 300. Progression has improved. Findings include abnormal foveal contour, intraretinal fluid, no  SRF, intraretinal hyper-reflective material, vitreomacular adhesion (Persistent IRF inferior macula - trace improvement centrally).   Left Eye Quality was good. Central Foveal Thickness: 386. Progression has improved. Findings include abnormal foveal contour, intraretinal fluid, intraretinal hyper-reflective material, vitreomacular adhesion , outer retinal atrophy, subretinal hyper-reflective material, no SRF (Persistent IRF/edema IT macula -- mild improvement).   Notes *Images captured and stored on drive  Diagnosis / Impression:  Macular edema OU - likely combination of CME + DME OU OD: persistent IRF inferior macula - trace improvement centrally OS: Persistent IRF/edema IT macula - mild improvement  Clinical management:  See below  Abbreviations: NFP - Normal foveal profile. CME - cystoid macular edema. PED - pigment epithelial detachment. IRF - intraretinal fluid. SRF - subretinal fluid. EZ - ellipsoid zone. ERM - epiretinal membrane. ORA - outer retinal atrophy. ORT - outer retinal tubulation. SRHM - subretinal hyper-reflective material      Intravitreal Injection, Pharmacologic Agent - OD - Right Eye       Time Out 02/23/2021. 8:32 AM. Confirmed correct patient, procedure, site, and patient consented.   Anesthesia Topical anesthesia was used. Anesthetic medications included Lidocaine 2%, Proparacaine 0.5%.   Procedure Preparation included 5% betadine to ocular surface, eyelid speculum. A (32g) needle was used.   Injection: 2 mg aflibercept 2 MG/0.05ML   Route: Intravitreal, Site: Right Eye   NDC: A3590391, Lot: [id number], Expiration date: 11/27/2021, Waste: 0.05 mL   Post-op Post injection exam found visual acuity of at least counting fingers. The patient tolerated the procedure well. There were no complications. The patient received written and verbal post procedure care education. Post injection medications were not given.      Intravitreal Injection,  Pharmacologic Agent - OS - Left Eye       Time Out 02/23/2021. 8:33 AM. Confirmed correct patient, procedure, site, and patient consented.   Anesthesia Topical anesthesia was used. Anesthetic medications included Lidocaine 2%, Proparacaine 0.5%.   Procedure Preparation included 5% betadine to ocular surface, eyelid speculum. A (32g) needle was used.   Injection: 2 mg aflibercept 2 MG/0.05ML   Route: Intravitreal, Site: Left Eye   NDC: M7179715, Lot: [id number], Expiration date: 08/28/2021, Waste: 0.05 mL   Post-op Post injection exam found visual acuity of at least counting fingers. The patient tolerated the procedure well. There were no complications. The patient received written and verbal post procedure care education. Post injection medications were not given.            ASSESSMENT/PLAN:    ICD-10-CM   1. Moderate nonproliferative diabetic retinopathy of both eyes with macular edema associated with type 2 diabetes mellitus (HCC)  J69.6789 Intravitreal Injection, Pharmacologic Agent - OD - Right Eye    Intravitreal Injection, Pharmacologic Agent -  OS - Left Eye    aflibercept (EYLEA) SOLN 2 mg    aflibercept (EYLEA) SOLN 2 mg    2. CME (cystoid macular edema), bilateral  H35.353     3. Retinal edema  H35.81 OCT, Retina - OU - Both Eyes    4. Hypertensive retinopathy of both eyes  H35.033     5. Pseudophakia of both eyes  Z96.1     6. Essential hypertension  I10      1-3. Moderate Non-proliferative diabetic retinopathy, OU OD with combination DME/CME  **delayed to follow up from September 2021 to April 2022--7 mos instead of 4 wks**  - exam shows scattered [place], edema and tortuous blood vessels OU  - FA (10.23.20) shows leaking MA OU and paracentral petaloid hyperfluorecence OD --> CME/[place]  - OCT shows diabetic macular edema, OU; OD with CME/[place] component contributing as well  - s/p IVA OS #1 (10.23.20), #2 (11.18.20), #3 (12.18.20), #4 (02.24.21),  #5 (03.24.21)  - s/p IVA OD #1 (11.06.20), #2 (12.18.20), #3 (02.24.21), #4 (03.24.21), #5 (04.21.21), #6 (5.26.21), #7 (07.26.21), #8 (08.25.21), #9 (09.23.21), #10 (04.26.22)  - s/p IVE OS #1 (04.21.21), #2 (5.26.21), #3 (07.26.21), #4 (08.25.21), #5 (09.23.21), #6 (04.26.22), #7 (05.25.22), #8 (07.01.22), #9 (07.29.22), #10 (08.29.22 - JDM)  - s/p IVE OD #1 (05.25.22), #2 (07.01.22), #3 (07.29.22), #4 (08.29.22 - JDM)  - BCVA OD 20/20; OS 20/200 improved from 20/400  - OCT today shows OD: persistent IRF inferior macula - trace improvement centrally; OS: Persistent IRF/edema IT macula  - recommend IVE OD #5 and IVE #11 OS today, 09.26.22  - pt wishes to proceed  - RBA of procedure discussed, questions answered  - informed consent obtained  - Avastin informed consent form signed and scanned on 12.18.2020  - Eylea OS informed consent form signed and scanned on 4.21.2021  - Eylea OD informed consent form signed and scanned on 05.25.22  - see procedure note  - Eylea4U benefits investigation started, 04.21.21  - cont Pred Forte and Ketorolac QID OU  - f/u in 4 weeks, DFE, OCT, possible injection OU  4,5. Hypertensive retinopathy OU  - discussed importance of tight BP control  - monitor  6. Pseudophakia OU  - s/p CE/IOL OU Gershon Crane)   OD: 03/07/2019   OS: 02/28/2019  - IOLs in good position  - CME OU as above  - monitor  Ophthalmic Meds Ordered this visit:  Meds ordered this encounter  Medications   aflibercept (EYLEA) SOLN 2 mg   aflibercept (EYLEA) SOLN 2 mg      Return in about 4 weeks (around 03/23/2021) for f/u exu ARMD OU, DFE, OCT.  There are no Patient Instructions on file for this visit.  This document serves as a record of services personally performed by Gardiner Sleeper, MD, PhD. It was created on their behalf by Leeann Must, [place], an ophthalmic technician. The creation of this record is the provider's dictation and/or activities during the visit.     Electronically signed by: Leeann Must, COA @TODAY @ 4:56 PM  This document serves as a record of services personally performed by Gardiner Sleeper, MD, PhD. It was created on their behalf by San Jetty. Owens Shark, OA an ophthalmic technician. The creation of this record is the provider's dictation and/or activities during the visit.    Electronically signed by: San Jetty. Owens Shark, [place] 09.26.2022 4:56 PM  Gardiner Sleeper, M.D., Ph.D. Diseases & Surgery of the Retina and Vitreous [facility]  [place] 02/23/2021   I have reviewed the above documentation for accuracy and completeness, and I agree with the above. Gardiner Sleeper, M.D., Ph.D. 02/23/21 4:56 PM   Abbreviations: M myopia (nearsighted); A astigmatism; H hyperopia (farsighted); P presbyopia; Mrx spectacle prescription;  CTL contact lenses; OD right eye; OS left eye; OU both eyes  XT exotropia; ET esotropia; PEK punctate epithelial keratitis; PEE punctate epithelial erosions; DES dry eye syndrome; MGD meibomian gland dysfunction; ATs artificial tears; PFAT's preservative free artificial tears; [place] nuclear sclerotic cataract; PSC posterior subcapsular cataract; ERM epi-retinal membrane; PVD posterior vitreous detachment; RD retinal detachment; DM diabetes mellitus; DR diabetic retinopathy; NPDR non-proliferative diabetic retinopathy; PDR proliferative diabetic retinopathy; CSME clinically significant macular edema; DME diabetic macular edema; dbh dot blot hemorrhages; CWS cotton wool spot; POAG primary open angle glaucoma; C/D cup-to-disc ratio; HVF humphrey visual field; GVF goldmann visual field; OCT optical coherence tomography; IOP intraocular pressure; BRVO Branch retinal vein occlusion; CRVO central retinal vein occlusion; CRAO central retinal artery occlusion; BRAO branch retinal artery occlusion; RT retinal tear; SB scleral buckle; PPV pars plana vitrectomy; VH Vitreous hemorrhage; PRP panretinal laser photocoagulation; IVK  intravitreal kenalog; VMT vitreomacular traction; MH Macular hole;  NVD neovascularization of the disc; NVE neovascularization elsewhere; AREDS age related eye disease study; ARMD age related macular degeneration; POAG primary open angle glaucoma; EBMD epithelial/anterior basement membrane dystrophy; ACIOL anterior chamber intraocular lens; IOL intraocular lens; PCIOL posterior chamber intraocular lens; Phaco/IOL phacoemulsification with intraocular lens placement; [place] photorefractive keratectomy; LASIK laser assisted in situ keratomileusis; HTN hypertension; DM diabetes mellitus; COPD chronic obstructive pulmonary disease

## 2021-02-23 ENCOUNTER — Other Ambulatory Visit: Payer: Self-pay

## 2021-02-23 ENCOUNTER — Ambulatory Visit (INDEPENDENT_AMBULATORY_CARE_PROVIDER_SITE_OTHER): Payer: Medicare Other | Admitting: Ophthalmology

## 2021-02-23 ENCOUNTER — Encounter (INDEPENDENT_AMBULATORY_CARE_PROVIDER_SITE_OTHER): Payer: Self-pay | Admitting: Ophthalmology

## 2021-02-23 DIAGNOSIS — Z961 Presence of intraocular lens: Secondary | ICD-10-CM

## 2021-02-23 DIAGNOSIS — H35033 Hypertensive retinopathy, bilateral: Secondary | ICD-10-CM

## 2021-02-23 DIAGNOSIS — I10 Essential (primary) hypertension: Secondary | ICD-10-CM

## 2021-02-23 DIAGNOSIS — E113313 Type 2 diabetes mellitus with moderate nonproliferative diabetic retinopathy with macular edema, bilateral: Secondary | ICD-10-CM

## 2021-02-23 DIAGNOSIS — H35353 Cystoid macular degeneration, bilateral: Secondary | ICD-10-CM | POA: Diagnosis not present

## 2021-02-23 DIAGNOSIS — H3581 Retinal edema: Secondary | ICD-10-CM

## 2021-02-23 MED ORDER — AFLIBERCEPT 2MG/0.05ML IZ SOLN FOR KALEIDOSCOPE
2.0000 mg | INTRAVITREAL | Status: AC | PRN
  Administered 2021-02-23: 2 mg via INTRAVITREAL

## 2021-02-25 ENCOUNTER — Other Ambulatory Visit: Payer: Self-pay | Admitting: Nurse Practitioner

## 2021-02-25 ENCOUNTER — Ambulatory Visit: Payer: Self-pay | Admitting: *Deleted

## 2021-02-25 DIAGNOSIS — R399 Unspecified symptoms and signs involving the genitourinary system: Secondary | ICD-10-CM

## 2021-02-25 NOTE — Telephone Encounter (Signed)
Summary: possible kidney infection   Pt called for an anti biotic and stated she has back pain and she thinks she has a kidney infection /she stated she has been feeling this way for about a month /  please advise      Pt reports urinary urgency and frequency, dysuria "Mild burning" x 1 month. Denies fever.  Also reports 10/10 lower back pain x 1 month. Reports tylenol and Advil help with pain, "But comes back." States pain occurs only with standing, walking, "Have to walk bent over."  Pt seen for UTI, on antibiotics 12/30/20 E-Coli. Reports vaginal itching, burning unsure if red, rash. Pt states all these symptoms have worsened over past 8 days. States :Probably my arthritis or kidney infection." Questioning if Dr. Wynetta Emery would call in antibiotics. Advised UC/ED for 10/10 back pain, declines. Would like to hear Dr. Durenda Age advise first.  Assured pt NT would route to practice for PCPs review and final disposition. Advised ED for worsening symptoms. Pt verbalizes understanding.  Please advise. CB# [phone]

## 2021-02-25 NOTE — Telephone Encounter (Signed)
Please advise 

## 2021-02-25 NOTE — Telephone Encounter (Signed)
NEEDS UA and wet prep

## 2021-02-25 NOTE — Telephone Encounter (Signed)
Contacted pt and lvm

## 2021-02-25 NOTE — Telephone Encounter (Signed)
See encounter. Patient has appt on 10/6 with Dr. Wynetta Emery

## 2021-02-25 NOTE — Telephone Encounter (Signed)
Summary: possible kidney infection   Pt called for an anti biotic and stated she has back pain and she thinks she has a kidney infection /she stated she has been feeling this way for about a month /  please advise      Reason for Disposition  [1] SEVERE back pain (e.g., excruciating, unable to do any normal activities) AND [2] not improved 2 hours after pain medicine  Answer Assessment - Initial Assessment Questions 1. ONSET: "When did the pain begin?"      1 month 2. LOCATION: "Where does it hurt?" (upper, mid or lower back)     Lower back 3. SEVERITY: "How bad is the pain?"  (e.g., Scale 1-10; mild, moderate, or severe)   - MILD (1-3): doesn't interfere with normal activities    - MODERATE (4-7): interferes with normal activities or awakens from sleep    - SEVERE (8-10): excruciating pain, unable to do any normal activities      10/10 4. PATTERN: "Is the pain constant?" (e.g., yes, no; constant, intermittent)      Lying down can bear it, sitting ok. Walking bent over, bad pain 5. RADIATION: "Does the pain shoot into your legs or elsewhere?"     Unsure 6. CAUSE:  "What do you think is causing the back pain?"      unsure 7. BACK OVERUSE:  "Any recent lifting of heavy objects, strenuous work or exercise?"     no 8. MEDICATIONS: "What have you taken so far for the pain?" (e.g., nothing, acetaminophen, NSAIDS)     Tylenol and advil help a little, comes right back 9. NEUROLOGIC SYMPTOMS: "Do you have any weakness, numbness, or problems with bowel/bladder control?"     Urinary urgency,weakness of legs sometimes 10. OTHER SYMPTOMS: "Do you have any other symptoms?" (e.g., fever, abdominal pain, burning with urination, blood in urine)       Burning with urination, vaginal itching  Protocols used: Back Pain-A-AH

## 2021-02-26 NOTE — Telephone Encounter (Signed)
Pt has been scheduled a nurse visit for 9/30 at 830am

## 2021-02-27 ENCOUNTER — Other Ambulatory Visit (HOSPITAL_COMMUNITY)
Admission: RE | Admit: 2021-02-27 | Discharge: 2021-02-27 | Disposition: A | Discharge status: Home / Self Care | Payer: Medicare Other | Source: Ambulatory Visit | Attending: Internal Medicine | Admitting: Internal Medicine

## 2021-02-27 ENCOUNTER — Other Ambulatory Visit: Payer: Self-pay | Admitting: Nurse Practitioner

## 2021-02-27 ENCOUNTER — Other Ambulatory Visit: Payer: Self-pay | Admitting: Internal Medicine

## 2021-02-27 ENCOUNTER — Other Ambulatory Visit: Payer: Self-pay

## 2021-02-27 ENCOUNTER — Ambulatory Visit: Payer: Medicare Other | Attending: Internal Medicine

## 2021-02-27 DIAGNOSIS — R399 Unspecified symptoms and signs involving the genitourinary system: Secondary | ICD-10-CM | POA: Diagnosis not present

## 2021-02-27 DIAGNOSIS — R8290 Unspecified abnormal findings in urine: Secondary | ICD-10-CM

## 2021-02-27 DIAGNOSIS — I2510 Atherosclerotic heart disease of native coronary artery without angina pectoris: Secondary | ICD-10-CM

## 2021-02-27 DIAGNOSIS — I10 Essential (primary) hypertension: Secondary | ICD-10-CM

## 2021-02-27 LAB — POCT URINALYSIS DIP (CLINITEK)
Bilirubin, UA: NEGATIVE
Glucose, UA: 500 mg/dL — AB
Ketones, POC UA: NEGATIVE mg/dL
Leukocytes, UA: NEGATIVE
Nitrite, UA: NEGATIVE
POC PROTEIN,UA: 30 — AB
Spec Grav, UA: 1.02 (ref 1.010–1.025)
Urobilinogen, UA: 0.2 E.U./dL
pH, UA: 6 (ref 5.0–8.0)

## 2021-02-27 MED ORDER — CARVEDILOL 25 MG PO TABS
ORAL_TABLET | Freq: Two times a day (BID) | ORAL | 5 refills | Status: DC
  Filled 2021-02-27: qty 60, 30d supply, fill #0
  Filled 2021-04-15 – 2021-04-27 (×3): qty 60, 30d supply, fill #1
  Filled 2021-06-08: qty 60, 30d supply, fill #0
  Filled 2021-06-08: qty 60, 30d supply, fill #2
  Filled 2021-07-10: qty 60, 30d supply, fill #1
  Filled 2021-08-27: qty 60, 30d supply, fill #2
  Filled 2021-09-28: qty 60, 30d supply, fill #3

## 2021-02-27 NOTE — Progress Notes (Signed)
Pt arrived at clinic for UA and self swab. Pt was informed that she will be contacted with results and if any medication is needed.  Results note has been sent to [facility].

## 2021-02-28 LAB — URINALYSIS, COMPLETE
Bilirubin, UA: NEGATIVE
Ketones, UA: NEGATIVE
Nitrite, UA: NEGATIVE
RBC, UA: NEGATIVE
Specific Gravity, UA: >=1.03 — AB (ref 1.005–1.030)
Urobilinogen, Ur: 0.2 mg/dL (ref 0.2–1.0)
pH, UA: 5.5 (ref 5.0–7.5)

## 2021-02-28 LAB — MICROSCOPIC EXAMINATION
Casts: NONE SEEN /lpf
Epithelial Cells (non renal): >10 /hpf — AB (ref 0–10)

## 2021-03-01 ENCOUNTER — Other Ambulatory Visit: Payer: Self-pay | Admitting: Nurse Practitioner

## 2021-03-01 MED ORDER — SULFAMETHOXAZOLE-TRIMETHOPRIM 800-160 MG PO TABS
1.0000 | ORAL_TABLET | Freq: Two times a day (BID) | ORAL | 0 refills | Status: AC
  Filled 2021-03-01: qty 6, 3d supply, fill #0

## 2021-03-02 ENCOUNTER — Other Ambulatory Visit: Payer: Self-pay | Admitting: Nurse Practitioner

## 2021-03-02 ENCOUNTER — Other Ambulatory Visit: Payer: Self-pay

## 2021-03-02 LAB — CERVICOVAGINAL ANCILLARY ONLY
Bacterial Vaginitis (gardnerella): POSITIVE — AB
Candida Glabrata: NEGATIVE
Candida Vaginitis: POSITIVE — AB
Chlamydia: NEGATIVE
Comment: NEGATIVE
Comment: NEGATIVE
Comment: NEGATIVE
Comment: NEGATIVE
Comment: NEGATIVE
Comment: NORMAL
Neisseria Gonorrhea: NEGATIVE
Trichomonas: NEGATIVE

## 2021-03-02 MED ORDER — FLUCONAZOLE 150 MG PO TABS
150.0000 mg | ORAL_TABLET | Freq: Once | ORAL | 1 refills | Status: AC
  Filled 2021-03-02: qty 1, 1d supply, fill #0

## 2021-03-02 MED ORDER — METRONIDAZOLE 500 MG PO TABS
500.0000 mg | ORAL_TABLET | Freq: Two times a day (BID) | ORAL | 0 refills | Status: AC
  Filled 2021-03-02: qty 14, 7d supply, fill #0

## 2021-03-03 ENCOUNTER — Other Ambulatory Visit: Payer: Self-pay

## 2021-03-05 ENCOUNTER — Ambulatory Visit: Payer: Medicare Other | Admitting: Internal Medicine

## 2021-03-18 NOTE — Progress Notes (Signed)
[facility] Note  03/23/2021     CHIEF COMPLAINT Patient presents for Retina Follow Up   HISTORY OF PRESENT ILLNESS: Kimberly Frazier is a 60 y.o. female who presents to the clinic today for:   HPI     Retina Follow Up   Patient presents with  Diabetic Retinopathy.  In both eyes.  This started years ago.  Severity is moderate.  Duration of 4 weeks.  Since onset it is stable.  I, the attending physician,  performed the HPI with the patient and updated documentation appropriately.        Comments   60 y/o female pt here for 4 wk f/u for mod NPDR OU.  No change in [place] OU.  Denies pain, FOL, floaters.  Pred and Ketorolac QID OU.  BS 128 yesterday.  A1C unknown, but "above 7."      Last edited by Bernarda Caffey, MD on 03/23/2021 12:12 PM.    Pt states vision is "steady", she is using Pred and Ketorolac QID OS, she states her A1c is above 7, but it's been awhile since it has been checked, she states her BG and BP numbers are "good"   Referring physician: Ladell Pier, MD [place],  [place] [address]  HISTORICAL INFORMATION:   Selected notes from the MEDICAL RECORD NUMBER Referred by Dr. Rutherford Guys for concern of CME   CURRENT MEDICATIONS: Current Outpatient Medications (Ophthalmic Drugs)  Medication Sig   ketorolac (ACULAR) 0.5 % ophthalmic solution Place 1 drop into both eyes 4 (four) times daily.   ketorolac (ACULAR) 0.5 % ophthalmic solution Place 1 drop into both eyes 4 (four) times daily.   prednisoLONE acetate (PRED FORTE) 1 % ophthalmic suspension Place 1 drop into both eyes 4 (four) times daily.   Bromfenac Sodium (PROLENSA) 0.07 % SOLN Place 1 drop into both eyes 4 (four) times daily. (Patient not taking: Reported on 03/23/2021)   prednisoLONE acetate (PRED FORTE) 1 % ophthalmic suspension Place 1 drop into both eyes 4 (four) times daily. (Patient not taking: Reported on 03/23/2021)   No current facility-administered  medications for this visit. (Ophthalmic Drugs)   Current Outpatient Medications (Other)  Medication Sig   Accu-Chek Softclix Lancets lancets Use to check blood sugar 3 times daily.   amLODipine (NORVASC) 10 MG tablet TAKE 1 TABLET (10 MG TOTAL) BY MOUTH DAILY.   aspirin EC 81 MG tablet Take 1 tablet (81 mg total) daily by mouth.   Blood Glucose Monitoring Suppl (ACCU-CHEK GUIDE) w/Device KIT 1 kit by Does not apply route in the morning, at noon, and at bedtime. Use to check blood sugar 3 times daily.   butalbital-acetaminophen-caffeine (FIORICET) 50-325-40 MG tablet Take 1 tablet by mouth every 6 (six) hours as needed for headache.   carvedilol (COREG) 25 MG tablet TAKE 1 TABLET (25 MG TOTAL) BY MOUTH 2 (TWO) TIMES DAILY WITH A MEAL.   empagliflozin (JARDIANCE) 10 MG TABS tablet TAKE 1 TABLET (10 MG TOTAL) BY MOUTH DAILY.   glucose blood (ACCU-CHEK GUIDE) test strip Use to check blood sugar 3 times daily.   insulin aspart (NOVOLOG) 100 UNIT/ML injection Inject 28 Units into the skin 3 (three) times daily before meals.   insulin glargine (LANTUS) 100 UNIT/ML Solostar Pen INJECT 58 UNITS INTO THE SKIN DAILY.   Insulin Syringe-Needle U-100 (BD INSULIN SYRINGE) 29G X 1/2" 0.3 ML MISC Use to inject Novolog TID.   Insulin Syringe-Needle U-100 31G X 15/64"  0.3 ML MISC USE TO INJECT NOVOLOG THREE TIMES DAILY   isosorbide mononitrate (IMDUR) 60 MG 24 hr tablet TAKE 1 TABLET (60 MG TOTAL) BY MOUTH DAILY.   Na Sulfate-K Sulfate-Mg Sulf 17.5-3.13-1.6 GM/177ML SOLN Suprep (no substitutions)-TAKE AS DIRECTED.   nitroGLYCERIN (NITROSTAT) 0.4 MG SL tablet Place 1 tab under tongue for chest pain.  May repeat after 5 minutes x 2.  DO NOT TAKE MORE THAN 3 TABS DURING AN EPISODE OF CHEST PAIN (Patient taking differently: Place 1 tab under tongue for chest pain.  May repeat after 5 minutes x 2.  DO NOT TAKE MORE THAN 3 TABS DURING AN EPISODE OF CHEST PAIN)   oxybutynin (DITROPAN-XL) 5 MG 24 hr tablet Take 1 tablet (5  mg total) by mouth at bedtime.   potassium chloride (KLOR-CON) 10 MEQ tablet TAKE 1 TABLET BY MOUTH EVERY DAY   TRUEPLUS PEN NEEDLES 32G X 4 MM MISC USE AS DIRECTED WITH INSULIN   atorvastatin (LIPITOR) 80 MG tablet Take 1 tablet (80 mg total) by mouth daily.   No current facility-administered medications for this visit. (Other)   REVIEW OF SYSTEMS: ROS   Positive for: Endocrine, Cardiovascular, Eyes Negative for: Constitutional, Gastrointestinal, Neurological, Skin, Genitourinary, Musculoskeletal, HENT, Respiratory, Psychiatric, Allergic/Imm, Heme/Lymph Last edited by Matthew Folks, COA on 03/23/2021  8:00 AM.    ALLERGIES No Known Allergies  PAST MEDICAL HISTORY Past Medical History:  Diagnosis Date   Adrenal adenoma, left    Arthritis    Cancer ([place])    CKD (chronic kidney disease), stage II    Coronary artery disease 07-28-2018 followed [facility]cp([facility])  currently due to no insurance   per cardiac cath 02-06-2015 (positive mild lateral ishcemia on stress test)--- dLAD 80%,  mLAD 40%,  mPDA 80% (small vessel),  ostial D1 70%,  CFx with lumial irregarlities-- medical management   Depression    Diabetic neuropathy ([place])    Diabetic retinopathy ([place])    NPDR OU   Headache    History of Bell's palsy 07/2011   per pt residual facial pain on left side occasionally   History of cancer of vagina 1999   per pt completed radiation and chemo   History of sepsis 12/30/2017   positive blood culter fro E.coli   Hyperlipidemia    Hypertension    Hypertensive retinopathy    OU   Hypokalemia    Insulin dependent type 2 diabetes mellitus, uncontrolled    followed by pcp---  A1c was 11.7 on 06-15-2018 in epic   Myocardial infarction [facility])    10 yrs ago?   Nocturia    Peripheral neuropathy    PMB (postmenopausal bleeding)    Wears dentures    upper   Wears glasses    Past Surgical History:  Procedure Laterality Date   BREAST BIOPSY  2012   benign   CARDIAC  CATHETERIZATION N/A 02/06/2015   Procedure: Left Heart Cath and Coronary Angiography;  Surgeon: Larey Dresser, MD;  Location: [facility];  Service: Cardiovascular;  Laterality: N/A;   CATARACT EXTRACTION Bilateral OD: 03/07/19, OS: 02/28/19   Dr. Gershon Crane   CATARACT EXTRACTION, BILATERAL     COLONOSCOPY     normal exam in [place], [place] abour 10-12 years ago   EYE SURGERY Bilateral OD: 03/07/19, OS: 02/28/19   Cat Sx - Dr. Gershon Crane   HYSTEROSCOPY WITH D & C N/A 08/01/2018   Procedure: DILATATION AND CURETTAGE [place];  Surgeon: Mora Bellman, MD;  Location:  [facility];  Service: Gynecology;  Laterality: N/A;   ROBOTIC ASSISTED TOTAL HYSTERECTOMY WITH BILATERAL SALPINGO OOPHERECTOMY N/A 11/28/2018   Procedure: XI ROBOTIC ASSISTED TOTAL HYSTERECTOMY WITH BILATERAL SALPINGO OOPHORECTOMY;  Surgeon: Everitt Amber, MD;  Location: [facility];  Service: Gynecology;  Laterality: N/A;   SENTINEL NODE BIOPSY N/A 11/28/2018   Procedure: SENTINEL NODE BIOPSY;  Surgeon: Everitt Amber, MD;  Location: [facility];  Service: Gynecology;  Laterality: N/A;   UMBILICAL HERNIA REPAIR  child    FAMILY HISTORY Family History  Problem Relation Age of Onset   Heart disease Mother    Hypertension Mother    Diabetes Mother    Cancer Father        hodgkins lymphoma   Heart disease Sister        heart attack   Diabetes Sister    Colon cancer Brother 23   Diabetes Maternal Aunt    Diabetes Maternal Uncle    Cancer Other        parent   Diabetes Other        parent   Heart disease Other        parent   Hyperlipidemia Other        parent   Hypertension Other        parent   Arthritis Other        parent   Breast cancer Neg Hx    Colon polyps Neg Hx    Esophageal cancer Neg Hx    Rectal cancer Neg Hx    Stomach cancer Neg Hx    SOCIAL HISTORY Social History   Tobacco Use   Smoking status: Never   Smokeless tobacco: Never  Vaping Use   Vaping Use: Never used  Substance Use  Topics   Alcohol use: Not Currently   Drug use: Yes    Types: Marijuana    Comment: every 2 weeks per pt       OPHTHALMIC EXAM: Base Eye Exam     Visual Acuity (Snellen - Linear)       Right Left   Dist [place] 20/20 -2 20/300   Dist ph [place]  NI         Tonometry (Tonopen, 8:06 AM)       Right Left   Pressure 18 22         Pupils       Dark Light Shape React APD   Right 4 3 Round Brisk None   Left 4 3 Round Brisk None         Visual Fields (Counting fingers)       Left Right    Full Full         Extraocular Movement       Right Left    Full, Ortho Full, Ortho         Neuro/Psych     Oriented x3: Yes   Mood/Affect: Normal         Dilation     Both eyes: 1.0% Mydriacyl, 2.5% Phenylephrine @ 8:06 AM           Slit Lamp and Fundus Exam     Slit Lamp Exam       Right Left   Lids/Lashes Dermatochalasis - upper lid Dermatochalasis - upper lid   Conjunctiva/Sclera Mild Melanosis Mild Melanosis   Cornea 1+ Punctate epithelial erosions 2+ central punctate epithelial erosions, decreased TBUT, mild tear film debris, well healed temporal cataract wounds   Anterior Chamber  Deep, 0.5+cell/pigment Deep, 0.5+cell/pigment   Iris Round and dilated, No NVI Round and dilated, No NVI   Lens PC IOL in good position with trace PCO PC IOL in good position with 1+PCO   Vitreous Vitreous syneresis Vitreous syneresis, Posterior vitreous detachment, vitreous condensations         Fundus Exam       Right Left   Disc Pallor, Sharp rim Compact, Pink and Sharp, temporal PPA   C/D Ratio 0.1 0.1   Macula blunted foveal reflex, +central edema - improved, scattered Microaneurysms, focal exudates inferior to fovea -- improved Blunted foveal reflex, central edema - persistent, +exudates/MA/IRH--greatest temporal macula   Vessels attenuated, Tortuous attenuated, Tortuous   Periphery Attached, scattered drusen, scattered DBH  Attached, scattered DBH greatest posteriorly,  mild peripheral drusen           IMAGING AND PROCEDURES  Imaging and Procedures for @TODAY @  OCT, Retina - OU - Both Eyes       Right Eye Quality was good. Central Foveal Thickness: 297. Progression has improved. Findings include abnormal foveal contour, intraretinal fluid, no SRF, intraretinal hyper-reflective material, vitreomacular adhesion (Interval improvement in IRF inferior and central macula ).   Left Eye Quality was good. Central Foveal Thickness: 404. Progression has been stable. Findings include abnormal foveal contour, intraretinal fluid, intraretinal hyper-reflective material, vitreomacular adhesion , outer retinal atrophy, subretinal hyper-reflective material, no SRF (Persistent IRF/edema IT macula -- minimal improvement).   Notes *Images captured and stored on drive  Diagnosis / Impression:  Macular edema OU - likely combination of CME + DME OU OD: Interval improvement in IRF inferior and central macula  OS: Persistent IRF/edema IT macula - minimal improvement  Clinical management:  See below  Abbreviations: NFP - Normal foveal profile. CME - cystoid macular edema. PED - pigment epithelial detachment. IRF - intraretinal fluid. SRF - subretinal fluid. EZ - ellipsoid zone. ERM - epiretinal membrane. ORA - outer retinal atrophy. ORT - outer retinal tubulation. SRHM - subretinal hyper-reflective material      Intravitreal Injection, Pharmacologic Agent - OD - Right Eye       Time Out 03/23/2021. 8:45 AM. Confirmed correct patient, procedure, site, and patient consented.   Anesthesia Topical anesthesia was used. Anesthetic medications included Lidocaine 2%, Proparacaine 0.5%.   Procedure Preparation included 5% betadine to ocular surface, eyelid speculum. A (32g) needle was used.   Injection: 2 mg aflibercept 2 MG/0.05ML   Route: Intravitreal, Site: Right Eye   NDC: A3590391, Lot: [id number], Expiration date: 01/28/2022, Waste: 0.05 mL   Post-op Post  injection exam found visual acuity of at least counting fingers. The patient tolerated the procedure well. There were no complications. The patient received written and verbal post procedure care education. Post injection medications were not given.      Intravitreal Injection, Pharmacologic Agent - OS - Left Eye       Time Out 03/23/2021. 8:46 AM. Confirmed correct patient, procedure, site, and patient consented.   Anesthesia Topical anesthesia was used. Anesthetic medications included Lidocaine 2%, Proparacaine 0.5%.   Procedure Preparation included 5% betadine to ocular surface, eyelid speculum. A (32g) needle was used.   Injection: 2 mg aflibercept 2 MG/0.05ML   Route: Intravitreal, Site: Left Eye   NDC: M7179715, Lot: [id number], Expiration date: 09/27/2021, Waste: 0.05 mL   Post-op Post injection exam found visual acuity of at least counting fingers. The patient tolerated the procedure well. There were no complications. The patient received written and verbal post procedure  care education. Post injection medications were not given.            ASSESSMENT/PLAN:    ICD-10-CM   1. Moderate nonproliferative diabetic retinopathy of both eyes with macular edema associated with type 2 diabetes mellitus (HCC)  K44.0102 Intravitreal Injection, Pharmacologic Agent - OD - Right Eye    Intravitreal Injection, Pharmacologic Agent - OS - Left Eye    aflibercept (EYLEA) SOLN 2 mg    aflibercept (EYLEA) SOLN 2 mg    2. CME (cystoid macular edema), bilateral  H35.353     3. Retinal edema  H35.81 OCT, Retina - OU - Both Eyes    4. Hypertensive retinopathy of both eyes  H35.033     5. Pseudophakia of both eyes  Z96.1     6. Essential hypertension  I10     1-3. Moderate Non-proliferative diabetic retinopathy, OU OD with combination DME/CME  **delayed to follow up from September 2021 to April 2022--7 mos instead of 4 wks**  - exam shows scattered IRH, edema and tortuous blood  vessels OU  - FA (10.23.20) shows leaking MA OU and paracentral petaloid hyperfluorecence OD --> CME/[place]  - OCT shows diabetic macular edema, OU; OD with CME/[place] component contributing as well  - s/p IVA OS #1 (10.23.20), #2 (11.18.20), #3 (12.18.20), #4 (02.24.21), #5 (03.24.21)  - s/p IVA OD #1 (11.06.20), #2 (12.18.20), #3 (02.24.21), #4 (03.24.21), #5 (04.21.21), #6 (5.26.21), #7 (07.26.21), #8 (08.25.21), #9 (09.23.21), #10 (04.26.22)  - s/p IVE OS #1 (04.21.21), #2 (5.26.21), #3 (07.26.21), #4 (08.25.21), #5 (09.23.21), #6 (04.26.22), #7 (05.25.22), #8 (07.01.22), #9 (07.29.22), #10 (08.29.22 - JDM), #11 (09.26.22)  - s/p IVE OD #1 (05.25.22), #2 (07.01.22), #3 (07.29.22), #4 (08.29.22 - JDM), #5 (09.26.22)  - BCVA OD 20/20; OS 20/200 improved from 20/400  - OCT today shows OD: Interval improvement in IRF inferior and central macula; OS: Persistent IRF/edema IT macula - minimal improvement  - recommend IVE OD #6 and IVE #12 OS today, 10.24.22  - possible Vabysmo candidate OS  - pt wishes to proceed w/ IVE OU  - RBA of procedure discussed, questions answered  - informed consent obtained  - Avast[place] informed consent form signed and scanned on 12.18.2020  - Eylea OS informed consent form signed and scanned on 4.21.2021  - Eylea OD informed consent form signed and scanned on 05.25.22  - see procedure note  - Eylea4U benefits investigation started, 04.21.21  - cont Pred Forte and Ketorolac QID OU  - f/u in 4 weeks, DFE, OCT, possible injection OU  4,5. Hypertensive retinopathy OU  - discussed importance of tight BP control  - monitor  6. Pseudophakia OU  - s/p CE/IOL OU Gershon Crane)   OD: 03/07/2019   OS: 02/28/2019  - IOLs in good position  - CME OU as above  - monitor  Ophthalmic Meds Ordered this visit:  Meds ordered this encounter  Medications   aflibercept (EYLEA) SOLN 2 mg   aflibercept (EYLEA) SOLN 2 mg     Return in about 4 weeks (around 04/20/2021) for  f/u NPDR OU, DFE, OCT.  There are no Patient Instructions on file for this visit.  This document serves as a record of services personally performed by Gardiner Sleeper, MD, PhD. It was created on their behalf by Leeann Must, [place], an ophthalmic technician. The creation of this record is the provider's dictation and/or activities during the visit.    Electronically signed by: Leeann Must, COA @TODAY @ 12:18 PM  This document serves as a record of services personally performed by Gardiner Sleeper, MD, PhD. It was created on their behalf by San Jetty. Owens Shark, OA an ophthalmic technician. The creation of this record is the provider's dictation and/or activities during the visit.    Electronically signed by: San Jetty. Owens Shark, [place] 10.24.2022 12:18 PM  Gardiner Sleeper, M.D., Ph.D. Diseases & Surgery of the Retina and [place] 03/23/2021   I have reviewed the above documentation for accuracy and completeness, and I agree with the above. Gardiner Sleeper, M.D., Ph.D. 03/23/21 12:18 PM  Abbreviations: M myopia (nearsighted); A astigmatism; H hyperopia (farsighted); P presbyopia; Mrx spectacle prescription;  CTL contact lenses; OD right eye; OS left eye; OU both eyes  XT exotropia; ET esotropia; PEK punctate epithelial keratitis; PEE punctate epithelial erosions; DES dry eye syndrome; MGD meibomian gland dysfunction; ATs artificial tears; PFAT's preservative free artificial tears; [place] nuclear sclerotic cataract; PSC posterior subcapsular cataract; ERM epi-retinal membrane; PVD posterior vitreous detachment; RD retinal detachment; DM diabetes mellitus; DR diabetic retinopathy; NPDR non-proliferative diabetic retinopathy; PDR proliferative diabetic retinopathy; CSME clinically significant macular edema; DME diabetic macular edema; dbh dot blot hemorrhages; CWS cotton wool spot; POAG primary open angle glaucoma; C/D cup-to-disc ratio; HVF humphrey visual field; GVF goldmann  visual field; OCT optical coherence tomography; IOP intraocular pressure; BRVO Branch retinal vein occlusion; CRVO central retinal vein occlusion; CRAO central retinal artery occlusion; BRAO branch retinal artery occlusion; RT retinal tear; SB scleral buckle; PPV pars plana vitrectomy; VH Vitreous hemorrhage; PRP panretinal laser photocoagulation; IVK intravitreal kenalog; VMT vitreomacular traction; MH Macular hole;  NVD neovascularization of the disc; NVE neovascularization elsewhere; AREDS age related eye disease study; ARMD age related macular degeneration; POAG primary open angle glaucoma; EBMD epithelial/anterior basement membrane dystrophy; ACIOL anterior chamber intraocular lens; IOL intraocular lens; PCIOL posterior chamber intraocular lens; Phaco/IOL phacoemulsification with intraocular lens placement; [place] photorefractive keratectomy; LASIK laser assisted in situ keratomileusis; HTN hypertension; DM diabetes mellitus; COPD chronic obstructive pulmonary disease

## 2021-03-23 ENCOUNTER — Other Ambulatory Visit: Payer: Self-pay

## 2021-03-23 ENCOUNTER — Ambulatory Visit (INDEPENDENT_AMBULATORY_CARE_PROVIDER_SITE_OTHER): Payer: Medicare Other | Admitting: Ophthalmology

## 2021-03-23 ENCOUNTER — Encounter (INDEPENDENT_AMBULATORY_CARE_PROVIDER_SITE_OTHER): Payer: Self-pay | Admitting: Ophthalmology

## 2021-03-23 DIAGNOSIS — H35033 Hypertensive retinopathy, bilateral: Secondary | ICD-10-CM

## 2021-03-23 DIAGNOSIS — I10 Essential (primary) hypertension: Secondary | ICD-10-CM | POA: Diagnosis not present

## 2021-03-23 DIAGNOSIS — E113313 Type 2 diabetes mellitus with moderate nonproliferative diabetic retinopathy with macular edema, bilateral: Secondary | ICD-10-CM

## 2021-03-23 DIAGNOSIS — H35353 Cystoid macular degeneration, bilateral: Secondary | ICD-10-CM

## 2021-03-23 DIAGNOSIS — Z961 Presence of intraocular lens: Secondary | ICD-10-CM

## 2021-03-23 DIAGNOSIS — H3581 Retinal edema: Secondary | ICD-10-CM

## 2021-03-23 MED ORDER — AFLIBERCEPT 2MG/0.05ML IZ SOLN FOR KALEIDOSCOPE
2.0000 mg | INTRAVITREAL | Status: AC | PRN
  Administered 2021-03-23: 2 mg via INTRAVITREAL

## 2021-04-10 ENCOUNTER — Other Ambulatory Visit: Payer: Self-pay

## 2021-04-10 ENCOUNTER — Other Ambulatory Visit: Payer: Self-pay | Admitting: Internal Medicine

## 2021-04-10 MED FILL — Insulin Syringe/Needle U-100 0.3 ML 31 x 15/64": 33 days supply | Qty: 100 | Fill #1 | Status: AC

## 2021-04-11 MED ORDER — INSULIN PEN NEEDLE 32G X 4 MM MISC
0 refills | Status: DC
  Filled 2021-04-11: qty 100, 25d supply, fill #0

## 2021-04-11 NOTE — Telephone Encounter (Signed)
Requested Prescriptions  Pending Prescriptions Disp Refills  . Insulin Pen Needle (TRUEPLUS PEN NEEDLES) 32G X 4 MM MISC 100 each 0    Sig: USE AS DIRECTED WITH INSULIN     Endocrinology: Diabetes - Testing Supplies Passed - 04/10/2021  2:24 PM      Passed - Valid encounter within last 12 months    Recent Outpatient Visits          2 months ago Type 2 diabetes mellitus with morbid obesity ([place])   [place], MD   3 months ago [place], MD   5 months ago Essential hypertension   [place], Jarome Matin, RPH-CPP   6 months ago Encounter for [employer] annual wellness exam   [place] Karle Plumber B, MD   8 months ago Type 2 diabetes mellitus with morbid obesity [facility])   [place], Deborah B, MD      Future Appointments            In 1 week Cle[place], Jossie Ng, NP [facility]

## 2021-04-13 ENCOUNTER — Other Ambulatory Visit: Payer: Self-pay

## 2021-04-14 ENCOUNTER — Other Ambulatory Visit: Payer: Self-pay

## 2021-04-15 ENCOUNTER — Other Ambulatory Visit: Payer: Self-pay | Admitting: Internal Medicine

## 2021-04-15 ENCOUNTER — Other Ambulatory Visit: Payer: Self-pay

## 2021-04-15 DIAGNOSIS — Z794 Long term (current) use of insulin: Secondary | ICD-10-CM

## 2021-04-15 NOTE — Telephone Encounter (Signed)
Requested medication (s) are due for refill today:   Yes  Requested medication (s) are on the active medication list:   Yes  Future visit scheduled:   No   Last ordered: 03/28/2020 45 ml, 6 refills  Returned because rx has expired.  Needs a new one.   Requested Prescriptions  Pending Prescriptions Disp Refills   insulin glargine (LANTUS SOLOSTAR) 100 UNIT/ML Solostar Pen 45 mL 6    Sig: INJECT 58 UNITS INTO THE SKIN DAILY.     Endocrinology:  Diabetes - Insulins Failed - 04/15/2021  9:51 AM      Failed - HBA1C is between 0 and 7.9 and within 180 days    HbA1c, POC (controlled diabetic range)  Date Value Ref Range Status  01/30/2021 8.8 (A) 0.0 - 7.0 % Final          Passed - Valid encounter within last 6 months    Recent Outpatient Visits           2 months ago Type 2 diabetes mellitus with morbid obesity ([place])   [place], MD   3 months ago [place], MD   6 months ago Essential hypertension   [place], Jarome Matin, RPH-CPP   7 months ago Encounter for [employer] annual wellness exam   [place] Karle Plumber B, MD   8 months ago Type 2 diabetes mellitus with morbid obesity [facility])   [place], Deborah B, MD       Future Appointments             In 5 days Cleaver, Jossie Ng, NP [facility], [facility]

## 2021-04-16 MED ORDER — LANTUS SOLOSTAR 100 UNIT/ML ~~LOC~~ SOPN
PEN_INJECTOR | SUBCUTANEOUS | 0 refills | Status: DC
  Filled 2021-04-16: qty 15, 26d supply, fill #0
  Filled 2021-04-22: qty 15, 25d supply, fill #0

## 2021-04-16 NOTE — Progress Notes (Signed)
[facility] Note  04/20/2021     CHIEF COMPLAINT Patient presents for Retina Follow Up   HISTORY OF PRESENT ILLNESS: Kimberly Frazier is a 60 y.o. female who presents to the clinic today for:   HPI     Retina Follow Up   Patient presents with  Diabetic Retinopathy.  In both eyes.  Duration of 4 weeks.  Since onset it is stable.  I, the attending physician,  performed the HPI with the patient and updated documentation appropriately.        Comments   Pt states vision is stable, fol and floaters OS only, using PF and ketorolac QID OU, has not check BG this am      Last edited by Bernarda Caffey, MD on 04/20/2021  8:48 AM.     Pt states    Referring physician: Ladell Pier, MD [place],  [place] [address]  HISTORICAL INFORMATION:   Selected notes from the MEDICAL RECORD NUMBER Referred by Dr. Rutherford Guys for concern of CME   CURRENT MEDICATIONS: Current Outpatient Medications (Ophthalmic Drugs)  Medication Sig   Bromfenac Sodium (PROLENSA) 0.07 % SOLN Place 1 drop into both eyes 4 (four) times daily.   ketorolac (ACULAR) 0.5 % ophthalmic solution Place 1 drop into both eyes 4 (four) times daily.   ketorolac (ACULAR) 0.5 % ophthalmic solution Place 1 drop into both eyes 4 (four) times daily.   prednisoLONE acetate (PRED FORTE) 1 % ophthalmic suspension Place 1 drop into both eyes 4 (four) times daily.   prednisoLONE acetate (PRED FORTE) 1 % ophthalmic suspension Place 1 drop into both eyes 4 (four) times daily.   No current facility-administered medications for this visit. (Ophthalmic Drugs)   Current Outpatient Medications (Other)  Medication Sig   Accu-Chek Softclix Lancets lancets Use to check blood sugar 3 times daily.   amLODipine (NORVASC) 10 MG tablet TAKE 1 TABLET (10 MG TOTAL) BY MOUTH DAILY.   aspirin EC 81 MG tablet Take 1 tablet (81 mg total) daily by mouth.   atorvastatin (LIPITOR) 80 MG tablet Take 1 tablet  (80 mg total) by mouth daily.   Blood Glucose Monitoring Suppl (ACCU-CHEK GUIDE) w/Device KIT 1 kit by Does not apply route in the morning, at noon, and at bedtime. Use to check blood sugar 3 times daily.   butalbital-acetaminophen-caffeine (FIORICET) 50-325-40 MG tablet Take 1 tablet by mouth every 6 (six) hours as needed for headache.   carvedilol (COREG) 25 MG tablet TAKE 1 TABLET (25 MG TOTAL) BY MOUTH 2 (TWO) TIMES DAILY WITH A MEAL.   empagliflozin (JARDIANCE) 10 MG TABS tablet TAKE 1 TABLET (10 MG TOTAL) BY MOUTH DAILY.   glucose blood (ACCU-CHEK GUIDE) test strip Use to check blood sugar 3 times daily.   insulin aspart (NOVOLOG) 100 UNIT/ML injection Inject 28 Units into the skin 3 (three) times daily before meals.   insulin glargine (LANTUS SOLOSTAR) 100 UNIT/ML Solostar Pen INJECT 58 UNITS INTO THE SKIN DAILY.   Insulin Pen Needle 32G X 4 MM MISC Use as directed.   Insulin Syringe-Needle U-100 (BD INSULIN SYRINGE) 29G X 1/2" 0.3 ML MISC Use to inject Novolog TID.   Insulin Syringe-Needle U-100 31G X 15/64" 0.3 ML MISC USE TO INJECT NOVOLOG THREE TIMES DAILY   isosorbide mononitrate (IMDUR) 60 MG 24 hr tablet TAKE 1 TABLET (60 MG TOTAL) BY MOUTH DAILY.   Na Sulfate-K Sulfate-Mg Sulf 17.5-3.13-1.6 GM/177ML SOLN Suprep (no substitutions)-TAKE AS  DIRECTED.   nitroGLYCERIN (NITROSTAT) 0.4 MG SL tablet Place 1 tab under tongue for chest pain.  May repeat after 5 minutes x 2.  DO NOT TAKE MORE THAN 3 TABS DURING AN EPISODE OF CHEST PAIN (Patient taking differently: Place 1 tab under tongue for chest pain.  May repeat after 5 minutes x 2.  DO NOT TAKE MORE THAN 3 TABS DURING AN EPISODE OF CHEST PAIN)   oxybutynin (DITROPAN-XL) 5 MG 24 hr tablet Take 1 tablet (5 mg total) by mouth at bedtime.   potassium chloride (KLOR-CON) 10 MEQ tablet TAKE 1 TABLET BY MOUTH EVERY DAY   No current facility-administered medications for this visit. (Other)   REVIEW OF SYSTEMS: ROS   Positive for: Endocrine,  Cardiovascular, Eyes Negative for: Constitutional, Gastrointestinal, Neurological, Skin, Genitourinary, Musculoskeletal, HENT, Respiratory, Psychiatric, Allergic/Imm, Heme/Lymph Last edited by Debbrah Alar, COT on 04/20/2021  8:07 AM.     ALLERGIES No Known Allergies  PAST MEDICAL HISTORY Past Medical History:  Diagnosis Date   Adrenal adenoma, left    Arthritis    Cancer ([place])    CKD (chronic kidney disease), stage II    Coronary artery disease 07-28-2018 followed [facility])  currently due to no insurance   per cardiac cath 02-06-2015 (positive mild lateral ishcemia on stress test)--- dLAD 80%,  mLAD 40%,  mPDA 80% (small vessel),  ostial D1 70%,  CFx with lumial irregarlities-- medical management   Depression    Diabetic neuropathy ([place])    Diabetic retinopathy ([place])    NPDR OU   Headache    History of Bell's palsy 07/2011   per pt residual facial pain on left side occasionally   History of cancer of vagina 1999   per pt completed radiation and chemo   History of sepsis 12/30/2017   positive blood culter fro E.coli   Hyperlipidemia    Hypertension    Hypertensive retinopathy    OU   Hypokalemia    Insulin dependent type 2 diabetes mellitus, uncontrolled    followed by pcp---  A1c was 11.7 on 06-15-2018 in epic   Myocardial infarction [facility])    10 yrs ago?   Nocturia    Peripheral neuropathy    PMB (postmenopausal bleeding)    Wears dentures    upper   Wears glasses    Past Surgical History:  Procedure Laterality Date   BREAST BIOPSY  2012   benign   CARDIAC CATHETERIZATION N/A 02/06/2015   Procedure: Left Heart Cath and Coronary Angiography;  Surgeon: Larey Dresser, MD;  Location: [facility];  Service: Cardiovascular;  Laterality: N/A;   CATARACT EXTRACTION Bilateral OD: 03/07/19, OS: 02/28/19   Dr. Gershon Crane   CATARACT EXTRACTION, BILATERAL     COLONOSCOPY     normal exam in [place], [place] 10-12 years ago   EYE SURGERY  Bilateral OD: 03/07/19, OS: 02/28/19   Cat Sx - Dr. Gershon Crane   HYSTEROSCOPY WITH D & C N/A 08/01/2018   Procedure: DILATATION AND CURETTAGE /HYSTEROSCOPY;  Surgeon: Mora Bellman, MD;  Location: [facility];  Service: Gynecology;  Laterality: N/A;   ROBOTIC ASSISTED TOTAL HYSTERECTOMY WITH BILATERAL SALPINGO OOPHERECTOMY N/A 11/28/2018   Procedure: XI ROBOTIC ASSISTED TOTAL HYSTERECTOMY WITH BILATERAL SALPINGO OOPHORECTOMY;  Surgeon: Everitt Amber, MD;  Location: [facility];  Service: Gynecology;  Laterality: N/A;   SENTINEL NODE BIOPSY N/A 11/28/2018   Procedure: SENTINEL NODE BIOPSY;  Surgeon: Everitt Amber, MD;  Location: [facility];  Service: Gynecology;  Laterality: N/A;   UMBILICAL HERNIA REPAIR  child    FAMILY HISTORY Family History  Problem Relation Age of Onset   Heart disease Mother    Hypertension Mother    Diabetes Mother    Cancer Father        hodgkins lymphoma   Heart disease Sister        heart attack   Diabetes Sister    Colon cancer Brother 76   Diabetes Maternal Aunt    Diabetes Maternal Uncle    Cancer Other        parent   Diabetes Other        parent   Heart disease Other        parent   Hyperlipidemia Other        parent   Hypertension Other        parent   Arthritis Other        parent   Breast cancer Neg Hx    Colon polyps Neg Hx    Esophageal cancer Neg Hx    Rectal cancer Neg Hx    Stomach cancer Neg Hx    SOCIAL HISTORY Social History   Tobacco Use   Smoking status: Never   Smokeless tobacco: Never  Vaping Use   Vaping Use: Never used  Substance Use Topics   Alcohol use: Not Currently   Drug use: Yes    Types: Marijuana    Comment: every 2 weeks per pt       OPHTHALMIC EXAM: Base Eye Exam     Visual Acuity (Snellen - Linear)       Right Left   Dist [place] 20/20 -1 20/150 -2   Dist ph [place] NI NI         Tonometry (Tonopen, 8:14 AM)       Right Left   Pressure 19 19         Pupils       Dark Light Shape React APD    Right 3 2 Round Brisk None   Left 3 2 Round Brisk None         Visual Fields (Counting fingers)       Left Right    Full Full         Extraocular Movement       Right Left    Full, Ortho Full, Ortho         Neuro/Psych     Oriented x3: Yes   Mood/Affect: Normal         Dilation     Both eyes: 1.0% Mydriacyl, 2.5% Phenylephrine @ 8:14 AM           Slit Lamp and Fundus Exam     Slit Lamp Exam       Right Left   Lids/Lashes Dermatochalasis - upper lid Dermatochalasis - upper lid   Conjunctiva/Sclera Mild Melanosis Mild Melanosis   Cornea 1+ Punctate epithelial erosions 2+ central punctate epithelial erosions, decreased TBUT, mild tear film debris, well healed temporal cataract wounds   Anterior Chamber Deep, 0.5+cell/pigment Deep, 0.5+cell/pigment   Iris Round and dilated, No NVI Round and dilated, No NVI   Lens PC IOL in good position with trace PCO PC IOL in good position with 1+PCO   Anterior Vitreous Vitreous syneresis Vitreous syneresis, Posterior vitreous detachment, vitreous condensations         Fundus Exam       Right Left   Disc Pallor, Hervey Ard  rim Compact, Pink and Sharp, temporal PPA   C/D Ratio 0.1 0.1   Macula Good foveal reflex, persistent edema inferior macula, scattered Microaneurysms, focal exudates inferior to fovea -- improved Blunted foveal reflex, central edema - persistent, +exudates/MA/IRH -- greatest temporal macula -- minimal improvement   Vessels attenuated, Tortuous attenuated, Tortuous   Periphery Attached, scattered drusen, scattered DBH  Attached, scattered DBH greatest posteriorly, mild peripheral drusen, prominent DBH inferior disc           IMAGING AND PROCEDURES  Imaging and Procedures for @TODAY @  OCT, Retina - OU - Both Eyes       Right Eye Quality was good. Central Foveal Thickness: 288. Progression has been stable. Findings include abnormal foveal contour, intraretinal fluid, no SRF, intraretinal  hyper-reflective material, vitreomacular adhesion (persistent IRF inferior and central macula ).   Left Eye Quality was good. Central Foveal Thickness: 450. Progression has worsened. Findings include abnormal foveal contour, intraretinal fluid, intraretinal hyper-reflective material, vitreomacular adhesion , outer retinal atrophy, subretinal hyper-reflective material, no SRF (Persistent IRF/edema IT macula -- slightly increased).   Notes *Images captured and stored on drive  Diagnosis / Impression:  Macular edema OU - likely combination of CME + DME OU OD: persistent IRF inferior and central macula  OS: Persistent IRF/edema IT macula - slightly increased  Clinical management:  See below  Abbreviations: NFP - Normal foveal profile. CME - cystoid macular edema. PED - pigment epithelial detachment. IRF - intraretinal fluid. SRF - subretinal fluid. EZ - ellipsoid zone. ERM - epiretinal membrane. ORA - outer retinal atrophy. ORT - outer retinal tubulation. SRHM - subretinal hyper-reflective material      Intravitreal Injection, Pharmacologic Agent - OD - Right Eye       Time Out 04/20/2021. 9:03 AM. Confirmed correct patient, procedure, site, and patient consented.   Anesthesia Topical anesthesia was used. Anesthetic medications included Lidocaine 2%, Proparacaine 0.5%.   Procedure Preparation included 5% betadine to ocular surface, eyelid speculum. A (32g) needle was used.   Injection: 2 mg aflibercept 2 MG/0.05ML   Route: Intravitreal, Site: Right Eye   NDC: [id number], Lot: [id number], Expiration date: 02/27/2022, Waste: 0.05 mL   Post-op Post injection exam found visual acuity of at least counting fingers. The patient tolerated the procedure well. There were no complications. The patient received written and verbal post procedure care education. Post injection medications were not given.      Intravitreal Injection, Pharmacologic Agent - OS - Left Eye       Time  Out 04/20/2021. 9:14 AM. Confirmed correct patient, procedure, site, and patient consented.   Anesthesia Topical anesthesia was used. Anesthetic medications included Lidocaine 2%, Proparacaine 0.5%.   Procedure Preparation included 5% betadine to ocular surface, eyelid speculum. A (32g) needle was used.   Injection: 2 mg aflibercept 2 MG/0.05ML   Route: Intravitreal, Site: Left Eye   NDC: [phone], Waste: 0 mL   Post-op Post injection exam found visual acuity of at least counting fingers. The patient tolerated the procedure well. There were no complications. The patient received written and verbal post procedure care education.   Notes **VABYSMO SAMPLE ADMINISTERED**  NDC:  83818-403-75 LOT #:  [id number] EXP:  12/29/2022 WASTE: 0.67m            ASSESSMENT/PLAN:    ICD-10-CM   1. Moderate nonproliferative diabetic retinopathy of both eyes with macular edema associated with type 2 diabetes mellitus (HCC)  EH40.3524Intravitreal Injection, Pharmacologic Agent - OD - Right Eye  Intravitreal Injection, Pharmacologic Agent - OS - Left Eye    aflibercept (EYLEA) SOLN 2 mg    aflibercept (EYLEA) SOLN 2 mg    CANCELED: Intravitreal Injection, Pharmacologic Agent - OS - Left Eye    2. CME (cystoid macular edema), bilateral  H35.353     3. Retinal edema  H35.81 OCT, Retina - OU - Both Eyes    4. Essential hypertension  I10     5. Hypertensive retinopathy of both eyes  H35.033     6. Pseudophakia of both eyes  Z96.1      1-3. Moderate Non-proliferative diabetic retinopathy, OU OD with combination DME/CME  **delayed to follow up from September 2021 to April 2022--7 mos instead of 4 wks**  - exam shows scattered IRH, edema and tortuous blood vessels OU  - FA (10.23.20) shows leaking MA OU and paracentral petaloid hyperfluorecence OD --> CME/[place]  - OCT shows diabetic macular edema, OU; OD with CME/[place] component contributing as well  - s/p IVA OS #1  (10.23.20), #2 (11.18.20), #3 (12.18.20), #4 (02.24.21), #5 (03.24.21)  - s/p IVA OD #1 (11.06.20), #2 (12.18.20), #3 (02.24.21), #4 (03.24.21), #5 (04.21.21), #6 (5.26.21), #7 (07.26.21), #8 (08.25.21), #9 (09.23.21), #10 (04.26.22)  - s/p IVE OS #1 (04.21.21), #2 (5.26.21), #3 (07.26.21), #4 (08.25.21), #5 (09.23.21), #6 (04.26.22), #7 (05.25.22), #8 (07.01.22), #9 (07.29.22), #10 (08.29.22 - JDM), #11 (09.26.22), #12 (10.24.22)  - s/p IVE OD #1 (05.25.22), #2 (07.01.22), #3 (07.29.22), #4 (08.29.22 - JDM), #5 (09.26.22), #6 (10.24.22)  - BCVA OD 20/20; OS 20/150 improved from 20/200  - OCT today shows OD: persistent IRF inferior and central macula; OS: Persistent IRF/edema IT macula -- slightly increased  - recommend IVE OD #7 and sample of IV Vabysmo OS #1 today, 11.21.22  - pt wishes to proceed w/ injections  - RBA of procedure discussed, questions answered  - informed consent obtained  - Avastin informed consent form signed and scanned on 12.18.2020  - Eylea OS informed consent form signed and scanned on 4.21.2021  - Eylea OD informed consent form signed and scanned on 05.25.22  - Vabysmo OS consent form signed and scanned on 11.21.22  - see procedure note  - Eylea4U benefits investigation started, 04.21.21  - cont Pred Forte and Ketorolac QID OU  - f/u in 4 weeks, DFE, OCT, possible injection OU  4,5. Hypertensive retinopathy OU  - discussed importance of tight BP control  - monitor  6. Pseudophakia OU  - s/p CE/IOL OU Gershon Crane)   OD: 03/07/2019   OS: 02/28/2019  - IOLs in good position  - CME OU as above  - monitor  Ophthalmic Meds Ordered this visit:  Meds ordered this encounter  Medications   ketorolac (ACULAR) 0.5 % ophthalmic solution    Sig: Place 1 drop into both eyes 4 (four) times daily.    Dispense:  10 mL    Refill:  4   aflibercept (EYLEA) SOLN 2 mg   aflibercept (EYLEA) SOLN 2 mg      Return in about 4 weeks (around 05/18/2021) for f/u NPDR OU, DFE,  OCT.  There are no Patient Instructions on file for this visit.  This document serves as a record of services personally performed by Gardiner Sleeper, MD, PhD. It was created on their behalf by Roselee Nova, COMT. The creation of this record is the provider's dictation and/or activities during the visit.  Electronically signed by: Roselee Nova, COMT 04/20/21 12:44 PM  This document serves  as a record of services personally performed by Gardiner Sleeper, MD, PhD. It was created on their behalf by San Jetty. Owens Shark, OA an ophthalmic technician. The creation of this record is the provider's dictation and/or activities during the visit.    Electronically signed by: San Jetty. Owens Shark, [place] 11.21.2022 12:44 PM .  Gardiner Sleeper, M.D., Ph.D. Diseases & Surgery of the Retina and Vitreous [facility]  I have reviewed the above documentation for accuracy and completeness, and I agree with the above. Gardiner Sleeper, M.D., Ph.D. 04/20/21 12:44 PM  Abbreviations: M myopia (nearsighted); A astigmatism; H hyperopia (farsighted); P presbyopia; Mrx spectacle prescription;  CTL contact lenses; OD right eye; OS left eye; OU both eyes  XT exotropia; ET esotropia; PEK punctate epithelial keratitis; PEE punctate epithelial erosions; DES dry eye syndrome; MGD meibomian gland dysfunction; ATs artificial tears; PFAT's preservative free artificial tears; [place] nuclear sclerotic cataract; PSC posterior subcapsular cataract; ERM epi-retinal membrane; PVD posterior vitreous detachment; RD retinal detachment; DM diabetes mellitus; DR diabetic retinopathy; NPDR non-proliferative diabetic retinopathy; PDR proliferative diabetic retinopathy; CSME clinically significant macular edema; DME diabetic macular edema; dbh dot blot hemorrhages; CWS cotton wool spot; POAG primary open angle glaucoma; C/D cup-to-disc ratio; HVF humphrey visual field; GVF goldmann visual field; OCT optical coherence tomography; IOP  intraocular pressure; BRVO Branch retinal vein occlusion; CRVO central retinal vein occlusion; CRAO central retinal artery occlusion; BRAO branch retinal artery occlusion; RT retinal tear; SB scleral buckle; PPV pars plana vitrectomy; VH Vitreous hemorrhage; PRP panretinal laser photocoagulation; IVK intravitreal kenalog; VMT vitreomacular traction; MH Macular hole;  NVD neovascularization of the disc; NVE neovascularization elsewhere; AREDS age related eye disease study; ARMD age related macular degeneration; POAG primary open angle glaucoma; EBMD epithelial/anterior basement membrane dystrophy; ACIOL anterior chamber intraocular lens; IOL intraocular lens; PCIOL posterior chamber intraocular lens; Phaco/IOL phacoemulsification with intraocular lens placement; [place] photorefractive keratectomy; LASIK laser assisted in situ keratomileusis; HTN hypertension; DM diabetes mellitus; COPD chronic obstructive pulmonary disease

## 2021-04-17 ENCOUNTER — Other Ambulatory Visit: Payer: Self-pay

## 2021-04-19 NOTE — Progress Notes (Signed)
Cardiology Clinic Note   Patient Name: Kimberly Frazier Date of Encounter: 04/20/2021  Primary Care Provider:  Ladell Pier, MD Primary Cardiologist:  Kimberly Dresser, MD  Patient Profile    Kimberly Frazier 60 year old female presents the clinic today for follow-up evaluation of her essential hypertension.  Past Medical History    Past Medical History:  Diagnosis Date   Adrenal adenoma, left    Arthritis    Cancer ([place])    CKD (chronic kidney disease), stage II    Coronary artery disease 07-28-2018 followed [facility]  currently due to no insurance   per cardiac cath 02-06-2015 (positive mild lateral ishcemia on stress test)--- dLAD 80%,  mLAD 40%,  mPDA 80% (small vessel),  ostial D1 70%,  CFx with lumial irregarlities-- medical management   Depression    Diabetic neuropathy ([place])    Diabetic retinopathy ([place])    NPDR OU   Headache    History of Bell's palsy 07/2011   per pt residual facial pain on left side occasionally   History of cancer of vagina 1999   per pt completed radiation and chemo   History of sepsis 12/30/2017   positive blood culter fro E.coli   Hyperlipidemia    Hypertension    Hypertensive retinopathy    OU   Hypokalemia    Insulin dependent type 2 diabetes mellitus, uncontrolled    followed by pcp---  A1c was 11.7 on 06-15-2018 in epic   Myocardial infarction [facility])    10 yrs ago?   Nocturia    Peripheral neuropathy    PMB (postmenopausal bleeding)    Wears dentures    upper   Wears glasses    Past Surgical History:  Procedure Laterality Date   BREAST BIOPSY  2012   benign   CARDIAC CATHETERIZATION N/A 02/06/2015   Procedure: Left Heart Cath and Coronary Angiography;  Surgeon: Kimberly Dresser, MD;  Location: [place] [facility];  Service: Cardiovascular;  Laterality: N/A;   CATARACT EXTRACTION Bilateral OD: 03/07/19, OS: 02/28/19   Dr. Gershon Frazier   CATARACT EXTRACTION, BILATERAL     COLONOSCOPY     normal exam in   [place], [place] 10-12 years ago   EYE SURGERY Bilateral OD: 03/07/19, OS: 02/28/19   Cat Sx - Dr. Gershon Frazier   HYSTEROSCOPY WITH D & C N/A 08/01/2018   Procedure: DILATATION AND CURETTAGE /HYSTEROSCOPY;  Surgeon: Kimberly Bellman, MD;  Location: [place];  Service: Gynecology;  Laterality: N/A;   ROBOTIC ASSISTED TOTAL HYSTERECTOMY WITH BILATERAL SALPINGO OOPHERECTOMY N/A 11/28/2018   Procedure: XI ROBOTIC ASSISTED TOTAL HYSTERECTOMY WITH BILATERAL SALPINGO OOPHORECTOMY;  Surgeon: Kimberly Amber, MD;  Location: [facility];  Service: Gynecology;  Laterality: N/A;   SENTINEL NODE BIOPSY N/A 11/28/2018   Procedure: SENTINEL NODE BIOPSY;  Surgeon: Kimberly Amber, MD;  Location: [facility];  Service: Gynecology;  Laterality: N/A;   UMBILICAL HERNIA REPAIR  child    Allergies  No Known Allergies  History of Present Illness    [name] has a PMH of coronary artery disease, hypertension, hypercholesterolemia, DM2 on insulin, and obesity.  She was seen by Kimberly Frazier on 07/02/2020.  During that time she reported she had completed her antibiotics but was not yet back to her baseline.  She still had increased phlegm and shortness of breath.  She denied fever and chills.  She reported having an episode 3 weeks ago where she was in the store with her daughter.  She returned to  her car, felt that, and briefly lost consciousness.  She reported she vomited on herself.  She denied episodes of confusion surrounding the event.  She reported that she went to bed immediately following the event that fell asleep.  She reported occasional episodes of shortness of breath.  She also reported that she had a lack of energy.  She noted that she did have exposure to secondhand smoke daily with her brother smoking.  Her blood pressure was elevated and she reported that she does not check her blood pressure at home.  She had not taken her a.m. antihypertensive medications.  When her medications were reviewed she  reported that she took her Jardiance, aspirin, potassium, carvedilol in the morning and several hours later will take her amlodipine, atorvastatin, and Imdur.  She had only been taking her carvedilol 1 time per day.  She was educated about carvedilol being twice daily medication.  She denied chest pain, PND, orthopnea, lower extremity swelling, palpitations, and unexplained weight gain.  She presented to the clinic 10/08/20 for follow-up evaluation stated she felt well.  She reported that she had been having much less phlegm and had not been noticing chest discomfort.  She had noticed some lower extremity leg pain that improved with ambulation.  She reported that her brother still smoked in her home.  We reviewed her coronary CTA and her previous echocardiogram.  We also reviewed her last lipid panel.  We reviewed the importance of heart healthy low-sodium high-fiber diet.  She reported that she had been on atorvastatin 40 for quite some time.  I  increased her atorvastatin to 80 mg daily, planned repeat her fasting lipids and LFTs in 8 weeks, asked her to her monitor blood pressure and keep a log.  We planned follow-up with Kimberly Frazier in 4 to 6 months.  Her lipid panel 01/20/2021 showed an improvement in her total cholesterol down to 137 with a LDL cholesterol down from 92-72.  She presents to the clinic today for follow-up evaluation states she feels well.  She does note limitations in her physical activity related to her back pain.  She reports that she is fairly sedentary and does not have a formal exercise routine due to her back discomfort.  We reviewed her most recent cholesterol panel with an LDL of 72.  She reports that she does enjoy white bread.  We discussed adding ezetimibe to her medication regimen.  She wishes to work on her diet and exercise before adding another medication.  She enjoys eating fr late uits and vegetables as well.  She is staying away from salt.  Her blood pressure today on  recheck is 132/78.  I will give her chair exercises, have her increase her walking as tolerated, add a fiber supplement to her diet, and plan follow-up for 6 months.  Today she denies chest pain, shortness of breath, lower extremity edema, fatigue, palpitations, melena, hematuria, hemoptysis, diaphoresis, weakness, presyncope, syncope, orthopnea, and PND.   Home Medications    Prior to Admission medications   Medication Sig Start Date End Date Taking? Authorizing Provider  Accu-Chek Softclix Lancets lancets Use to check blood sugar 3 times daily. 08/27/19   Kimberly Pier, MD  albuterol (VENTOLIN HFA) 108 (90 Base) MCG/ACT inhaler Inhale 2 puffs into the lungs every 6 (six) hours as needed for wheezing or shortness of breath. 11/12/19   Kimberly Pier, MD  amLODipine (NORVASC) 10 MG tablet TAKE 1 TABLET (10 MG TOTAL) BY MOUTH DAILY. 07/29/20  07/29/21  Kimberly Pier, MD  aspirin EC 81 MG tablet Take 1 tablet (81 mg total) daily by mouth. 04/18/17   Kimberly Pier, MD  atorvastatin (LIPITOR) 40 MG tablet TAKE 1 TABLET DAILY BY MOUTH. 09/29/20 09/29/21  Kimberly Pier, MD  Blood Glucose Monitoring Suppl (ACCU-CHEK GUIDE) w/Device KIT 1 kit by Does not apply route in the morning, at noon, and at bedtime. Use to check blood sugar 3 times daily. 08/27/19   Kimberly Pier, MD  Bromfenac Sodium (PROLENSA) 0.07 % SOLN Place 1 drop into both eyes 4 (four) times daily. 03/23/19   Bernarda Caffey, MD  butalbital-acetaminophen-caffeine (FIORICET) [phone] MG tablet Take 1 tablet by mouth every 6 (six) hours as needed for headache. 12/21/19   Marcial Pacas, MD  carvedilol (COREG) 25 MG tablet TAKE 1 TABLET (25 MG TOTAL) BY MOUTH 2 (TWO) TIMES DAILY WITH A MEAL. 09/16/20 09/16/21  Kimberly Pier, MD  empagliflozin (JARDIANCE) 10 MG TABS tablet TAKE 1 TABLET (10 MG TOTAL) BY MOUTH DAILY. 03/28/20 03/28/21  Kimberly Pier, MD  fluconazole (DIFLUCAN) 150 MG tablet Take 150 mg by mouth once. 06/11/20    [provider]  glucose blood (ACCU-CHEK GUIDE) test strip Use to check blood sugar 3 times daily. 08/27/19   Kimberly Pier, MD  insulin aspart (NOVOLOG) 100 UNIT/ML injection Inject 28 Units into the skin 3 (three) times daily before meals. 12/18/19   Kimberly Pier, MD  insulin glargine (LANTUS) 100 UNIT/ML Solostar Pen INJECT 58 UNITS INTO THE SKIN DAILY. 03/28/20 03/28/21  Kimberly Pier, MD  Insulin Syringe-Needle U-100 (BD INSULIN SYRINGE) 29G X 1/2" 0.3 ML MISC Use to inject Novolog TID. 05/07/20   Kimberly Pier, MD  Insulin Syringe-Needle U-100 31G X 15/64" 0.3 ML MISC USE TO INJECT NOVOLOG THREE TIMES DAILY 05/07/20 05/07/21  Kimberly Pier, MD  isosorbide mononitrate (IMDUR) 60 MG 24 hr tablet TAKE 1 TABLET (60 MG TOTAL) BY MOUTH DAILY. 02/14/20 02/13/21  Kimberly Dresser, MD  ketorolac (ACULAR) 0.5 % ophthalmic solution Place 1 drop into both eyes 4 (four) times daily. 09/23/20 09/23/21  Bernarda Caffey, MD  nitroGLYCERIN (NITROSTAT) 0.4 MG SL tablet Place 1 tab under tongue for chest pain.  May repeat after 5 minutes x 2.  DO NOT TAKE MORE THAN 3 TABS DURING AN EPISODE OF CHEST PAIN Patient taking differently: Place 0.4 mg under the tongue every 5 (five) minutes as needed for chest pain. DO NOT TAKE MORE THAN 3 TABS DURING AN EPISODE OF CHEST PAIN 09/18/18   Kimberly Dresser, MD  oxybutynin (DITROPAN-XL) 5 MG 24 hr tablet Take 1 tablet (5 mg total) by mouth at bedtime. 09/16/20   Kimberly Pier, MD  potassium chloride (KLOR-CON) 10 MEQ tablet TAKE 1 TABLET BY MOUTH EVERY DAY 06/19/20   Kimberly Pier, MD  prednisoLONE acetate (PRED FORTE) 1 % ophthalmic suspension Place 1 drop into both eyes 4 (four) times daily. 09/23/20   Bernarda Caffey, MD  topiramate (TOPAMAX) 50 MG tablet Take 1 tablet (50 mg total) by mouth 2 (two) times daily. 12/21/19   Marcial Pacas, MD  TRUEPLUS PEN NEEDLES 32G X 4 MM MISC USE AS DIRECTED WITH INSULIN 10/30/19   Kimberly Pier, MD     Family History    Family History  Problem Relation Age of Onset   Heart disease Mother    Hypertension Mother    Diabetes Mother    Cancer Father  hodgkins lymphoma   Heart disease Sister        heart attack   Diabetes Sister    Colon cancer Brother 62   Diabetes Maternal Aunt    Diabetes Maternal Uncle    Cancer Other        parent   Diabetes Other        parent   Heart disease Other        parent   Hyperlipidemia Other        parent   Hypertension Other        parent   Arthritis Other        parent   Breast cancer Neg Hx    Colon polyps Neg Hx    Esophageal cancer Neg Hx    Rectal cancer Neg Hx    Stomach cancer Neg Hx    She indicated that her mother is deceased. She indicated that her father is deceased. She indicated that her sister is alive. She indicated that the status of her brother is unknown. She indicated that the status of her maternal aunt is unknown. She indicated that the status of her maternal uncle is unknown. She indicated that the status of her neg hx is unknown. She indicated that the status of her other is unknown.  Social History    Social History   Socioeconomic History   Marital status: Single    Spouse name: Not on file   Number of children: 2   Years of education: 12   Highest education level: Not on file  Occupational History   Occupation: retired  Tobacco Use   Smoking status: Never   Smokeless tobacco: Never  Vaping Use   Vaping Use: Never used  Substance and Sexual Activity   Alcohol use: Not Currently   Drug use: Yes    Types: Marijuana    Comment: every 2 weeks per pt   Sexual activity: Yes  Other Topics Concern   Not on file  Social History Narrative   Lives at home with her grandson.   Right-handed.   No daily caffeine use.   Social Determinants of Health   Financial Resource Strain: Not on file  Food Insecurity: Not on file  Transportation Needs: Not on file  Physical Activity: Not on file  Stress:  Not on file  Social Connections: Not on file  Intimate Partner Violence: Not on file     Review of Systems    General:  No chills, fever, night sweats or weight changes.  Cardiovascular:  No chest pain, dyspnea on exertion, edema, orthopnea, palpitations, paroxysmal nocturnal dyspnea. Dermatological: No rash, lesions/masses Respiratory: No cough, dyspnea Urologic: No hematuria, dysuria Abdominal:   No nausea, vomiting, diarrhea, bright red blood per rectum, melena, or hematemesis Neurologic:  No visual changes, wkns, changes in mental status. All other systems reviewed and are otherwise negative except as noted above.  Physical Exam    VS:  BP 132/78   Pulse 89   Ht 5' 4.5" (1.638 m)   Wt 288 lb (130.6 kg)   BMI 48.67 kg/m  , BMI Body mass index is 48.67 kg/m. GEN: Well nourished, well developed, in no acute distress. HEENT: normal. Neck: Supple, no JVD, carotid bruits, or masses. Cardiac: RRR, no murmurs, rubs, or gallops. No clubbing, cyanosis, edema.  Radials/DP/PT 2+ and equal bilaterally.  Respiratory:  Respirations regular and unlabored, clear to auscultation bilaterally. GI: Soft, nontender, nondistended, BS + x 4. MS: no deformity or atrophy. Skin:  warm and dry, no rash. Neuro:  Strength and sensation are intact. Psych: Normal affect.  Accessory Clinical Findings    Recent Labs: 01/20/2021: ALT 17 01/30/2021: BUN 19; Creatinine, Ser 1.14; Potassium 3.7; Sodium 142   Recent Lipid Panel    Component Value Date/Time   CHOL 137 01/20/2021 1020   TRIG 136 01/20/2021 1020   HDL 41 01/20/2021 1020   CHOLHDL 3.3 01/20/2021 1020   CHOLHDL 5 07/15/2015 1513   VLDL 44.0 (H) 07/15/2015 1513   LDLCALC 72 01/20/2021 1020   LDLDIRECT 93.0 07/15/2015 1513    ECG personally reviewed by me today-none today.  Echocardiogram 04/21/2020 1. Left ventricular ejection fraction, by estimation, is 50 to 55%. The  left ventricle has low normal function. The left ventricle  demonstrates  regional wall motion abnormalities (see scoring diagram/findings for  description). Left ventricular diastolic   parameters are consistent with Grade I diastolic dysfunction (impaired  relaxation). Elevated left atrial pressure.   2. Right ventricular systolic function is normal. The right ventricular  size is normal.   3. Large pericardial effusion. The pericardial effusion is  circumferential. There is no evidence of cardiac tamponade. Moderate  pleural effusion in the left lateral region.   4. The mitral valve is normal in structure. Mild mitral valve  regurgitation. No evidence of mitral stenosis.   5. The aortic valve is normal in structure. Aortic valve regurgitation is  not visualized. No aortic stenosis is present.   6. Aortic dilatation noted. There is mild dilatation of the ascending  aorta, measuring 40 mm.   7. The inferior vena cava is normal in size with greater than 50%  respiratory variability, suggesting right atrial pressure of 3 mmHg.   Comparison(s): Unchanged pericardial effusion from 09/06/2018.    CT coronary 09/29/19 Coronary calcium score: The patient's coronary artery calcium score is 350, which places the patient in the 97th percentile.   Coronary arteries: Normal coronary origins.  Right dominance.   Right Coronary Artery: Dominant. Mild 25-49% non-calcified plaque of the mid-vessel (CADRADS2).   Left Main Coronary Artery: Eccentric calcification with minimal 1-24% mixed stenosis (CADRADS1). Bifurcates into the LAD and LCx vessels.   Left Anterior Descending Coronary Artery: The LAD does not reach the apex and may be occluded in the distal portion. There is moderate 50-69% (CADRADS3) mid-vessel non-calcified stenosis.   Left Circumflex Artery: Minimal distal 1-24% diffuse non-calcified stenosis. Smaller distal OM1 branch has calcified plaque with mild to moderate stenosis.   Aorta: Normal size, 37 mm at the mid ascending aorta (level of  the PA bifurcation) measured double oblique. No calcifications. No dissection.   Aortic Valve: Trileaflet.  No calcifications.   Other findings:   Normal pulmonary vein drainage into the left atrium.   Normal left atrial appendage without a thrombus.   Dilated main pulmonary artery measuring 32 mm, suggestive of pulmonary hypertension.   Moderate sized circumferential pericardial effusion.   IMPRESSION: 1. Moderate non-calcified mid-LAD stenosis, possible distal LAD occlusion, CADRADS = 3. CT FFR will be performed and reported separately.   2. Coronary calcium score of 350. This was 97th percentile for age and sex matched control.   3. Normal coronary origin with right dominance.   4.  Moderate-sized circumferential pericardial effusion   5. Dilated main pulmonary artery to 32 mm, suggestive of pulmonary hypertension   6.  See radiology findings regarding lung parenchymal changes   Assessment & Plan   1.  Essential hypertension-BP today 132/78.  Continues to  be well-controlled at home. Continue carvedilol, amlodipine, Imdur Heart healthy low-sodium diet-salty 6 given Increase physical activity as tolerated-instructions were chair exercises given. Maintain blood pressure log  Coronary artery disease-  No recent episodes of arm neck or back pain.  Coronary CTA 09/29/2019 showed RCA 25-49 percent plaque, LAD 1-24% and 50-69%, left circumflex 1-24%.  Details above. Continue amlodipine, Imdur, nitroglycerin, aspirin, carvedilol Heart healthy low-sodium diet-salty 6 given Increase physical activity as tolerated  Shortness of breath-denies further episodes.  Only occasional cough, and stable breathing.  Previously, felt to be related to secondhand smoke. Follows with PCP  Hyperlipidemia-01/20/2021: Cholesterol, Total 137; HDL 41; LDL Chol Calc (NIH) 72; Triglycerides 136 Continue atorvastatin Add fiber supplement Heart healthy low-sodium high-fiber diet Increase physical  activity as tolerated Repeat fasting lipids and LFTs in 8-12 weeks.  Pericardial effusion- no chest pain , no chest discomfort with laying back .  Previous ESR normal, CRP elevated. Continue to monitor.  Disposition: Follow-up with Kimberly Frazier or me in 6 months.   Jossie Ng. Teryn Boerema NP-C    04/20/2021, 11:18 AM [address] Office [phone] Fax [phone]  Notice: This dictation was prepared with Dragon dictation along with smaller phrase technology. Any transcriptional errors that result from this process are unintentional and may not be corrected upon review.  I spent 15 minutes examining this patient, reviewing medications, and using patient centered shared decision making involving her cardiac care.  Prior to her visit I spent greater than 20 minutes reviewing her past medical history,  medications, and prior cardiac tests.

## 2021-04-20 ENCOUNTER — Other Ambulatory Visit: Payer: Self-pay

## 2021-04-20 ENCOUNTER — Encounter: Payer: Self-pay | Admitting: General Practice

## 2021-04-20 ENCOUNTER — Ambulatory Visit (INDEPENDENT_AMBULATORY_CARE_PROVIDER_SITE_OTHER): Payer: Medicare Other | Admitting: Ophthalmology

## 2021-04-20 ENCOUNTER — Encounter (INDEPENDENT_AMBULATORY_CARE_PROVIDER_SITE_OTHER): Payer: Self-pay | Admitting: Ophthalmology

## 2021-04-20 ENCOUNTER — Ambulatory Visit (INDEPENDENT_AMBULATORY_CARE_PROVIDER_SITE_OTHER): Payer: Medicare Other | Admitting: General Practice

## 2021-04-20 VITALS — BP 132/78 | HR 89 | Ht 64.5 in | Wt 288.0 lb

## 2021-04-20 DIAGNOSIS — I10 Essential (primary) hypertension: Secondary | ICD-10-CM

## 2021-04-20 DIAGNOSIS — R0602 Shortness of breath: Secondary | ICD-10-CM | POA: Diagnosis not present

## 2021-04-20 DIAGNOSIS — E113313 Type 2 diabetes mellitus with moderate nonproliferative diabetic retinopathy with macular edema, bilateral: Secondary | ICD-10-CM | POA: Diagnosis not present

## 2021-04-20 DIAGNOSIS — I25118 Atherosclerotic heart disease of native coronary artery with other forms of angina pectoris: Secondary | ICD-10-CM | POA: Diagnosis not present

## 2021-04-20 DIAGNOSIS — H35353 Cystoid macular degeneration, bilateral: Secondary | ICD-10-CM | POA: Diagnosis not present

## 2021-04-20 DIAGNOSIS — H35033 Hypertensive retinopathy, bilateral: Secondary | ICD-10-CM | POA: Diagnosis not present

## 2021-04-20 DIAGNOSIS — H3581 Retinal edema: Secondary | ICD-10-CM

## 2021-04-20 DIAGNOSIS — I3139 Other pericardial effusion (noninflammatory): Secondary | ICD-10-CM

## 2021-04-20 DIAGNOSIS — E7800 Pure hypercholesterolemia, unspecified: Secondary | ICD-10-CM | POA: Diagnosis not present

## 2021-04-20 DIAGNOSIS — Z961 Presence of intraocular lens: Secondary | ICD-10-CM

## 2021-04-20 MED ORDER — AFLIBERCEPT 2MG/0.05ML IZ SOLN FOR KALEIDOSCOPE
2.0000 mg | INTRAVITREAL | Status: AC | PRN
  Administered 2021-04-20: 2 mg via INTRAVITREAL

## 2021-04-20 MED ORDER — AFLIBERCEPT 2MG/0.05ML IZ SOLN FOR KALEIDOSCOPE
2.0000 mg | INTRAVITREAL | Status: AC | PRN
Start: 2021-04-20 — End: 2021-04-20
  Administered 2021-04-20: 2 mg via INTRAVITREAL

## 2021-04-20 MED ORDER — KETOROLAC TROMETHAMINE 0.5 % OP SOLN
1.0000 [drp] | Freq: Four times a day (QID) | OPHTHALMIC | 4 refills | Status: DC
Start: 2021-04-20 — End: 2021-10-22
  Filled 2021-04-20 – 2021-04-27 (×2): qty 10, 20d supply, fill #0
  Filled 2021-08-26: qty 10, 37d supply, fill #0

## 2021-04-20 NOTE — Patient Instructions (Signed)
Medication Instructions:  Start taking a fiber supplement: (metamucil or fiber one, etc.)  *If you need a refill on your cardiac medications before your next appointment, please call your pharmacy*   Lab Work: In 2 months get fasting blood work (lipids)  If you have labs (blood work) drawn today and your tests are completely normal, you will receive your results only by: [place] (if you have MyChart) OR A paper copy in the mail If you have any lab test that is abnormal or we need to change your treatment, we will call you to review the results.   Follow-Up: At [facility], you and your health needs are our priority.  As part of our continuing mission to provide you with exceptional heart care, we have created designated Provider Care Teams.  These Care Teams include your primary Cardiologist (physician) and Advanced Practice Providers (APPs -  Physician Assistants and Nurse Practitioners) who all work together to provide you with the care you need, when you need it.  We recommend signing up for the patient portal called "MyChart".  Sign up information is provided on this After Visit Summary.  MyChart is used to connect with patients for Virtual Visits (Telemedicine).  Patients are able to view lab/test results, encounter notes, upcoming appointments, etc.  Non-urgent messages can be sent to your provider as well.   To learn more about what you can do with MyChart, go to [email or web address].    Your next appointment:   6 month(s)  The format for your next appointment:   In Person  Provider:   Coletta Memos, FNP       Other Instructions Increase physical activity; start with chair exercises and then walk 5 minutes twice a day. Increase activity as tolerated.  Exercises to do While Sitting Exercises that you do while sitting (chair exercises) can give you many of the same benefits as full exercise. Benefits include strengthening your heart, burning calories, and keeping  muscles and joints healthy. Exercise can also improve your mood and help with depression and anxiety. You may benefit from chair exercises if you are unable to do standing exercises due to: Diabetic foot pain. Obesity. Illness. Arthritis. Recovery from surgery or injury. Breathing problems. Balance problems. Another type of disability. Before starting chair exercises, check with your health care provider or a physical therapist to find out how much exercise you can tolerate and which exercises are safe for you. If your health care provider approves: Start out slowly and build up over time. Aim to work up to about 10-20 minutes for each exercise session. Make exercise part of your daily routine. Drink water when you exercise. Do not wait until you are thirsty. Drink every 10-15 minutes. Stop exercising right away if you have pain, nausea, shortness of breath, or dizziness. If you are exercising in a wheelchair, make sure to lock the wheels. Ask your health care provider whether you can do tai chi or yoga. Many positions in these mind-body exercises can be modified to do while seated. Warm-up Before starting other exercises: Sit up as straight as you can. Have your knees bent at 90 degrees, which is the shape of the capital letter "L." Keep your feet flat on the floor. Sit at the front edge of your chair, if you can. Pull in (tighten) the muscles in your abdomen and stretch your spine and neck as straight as you can. Hold this position for a few minutes. Breathe in and out evenly. Try to concentrate  on your breathing, and relax your mind. Stretching Exercise A: Arm stretch Hold your arms out straight in front of your body. Bend your hands at the wrist with your fingers pointing up, as if signaling someone to stop. Notice the slight tension in your forearms as you hold the position. Keeping your arms out and your hands bent, rotate your hands outward as far as you can and hold this stretch.  Aim to have your thumbs pointing up and your pinkie fingers pointing down. Slowly repeat arm stretches for one minute as tolerated. Exercise B: Leg stretch If you can move your legs, try to "draw" letters on the floor with the toes of your foot. Write your name with one foot. Write your name with the toes of your other foot. Slowly repeat the movements for one minute as tolerated. Exercise C: Reach for the sky Reach your hands as far over your head as you can to stretch your spine. Move your hands and arms as if you are climbing a rope. Slowly repeat the movements for one minute as tolerated. Range of motion exercises Exercise A: Shoulder roll Let your arms hang loosely at your sides. Lift just your shoulders up toward your ears, then let them relax back down. When your shoulders feel loose, rotate your shoulders in backward and forward circles. Do shoulder rolls slowly for one minute as tolerated. Exercise B: March in place As if you are marching, pump your arms and lift your legs up and down. Lift your knees as high as you can. If you are unable to lift your knees, just pump your arms and move your ankles and feet up and down. March in place for one minute as tolerated. Exercise C: Seated jumping jacks Let your arms hang down straight. Keeping your arms straight, lift them up over your head. Aim to point your fingers to the ceiling. While you lift your arms, straighten your legs and slide your heels along the floor to your sides, as wide as you can. As you bring your arms back down to your sides, slide your legs back together. If you are unable to use your legs, just move your arms. Slowly repeat seated jumping jacks for one minute as tolerated. Strengthening exercises Exercise A: Shoulder squeeze Hold your arms straight out from your body to your sides, with your elbows bent and your fists pointed at the ceiling. Keeping your arms in the bent position, move them forward so your  elbows and forearms meet in front of your face. Open your arms back out as wide as you can with your elbows still bent, until you feel your shoulder blades squeezing together. Hold for 5 seconds. Slowly repeat the movements forward and backward for one minute as tolerated. Contact a health care provider if: You have to stop exercising due to any of the following: Pain. Nausea. Shortness of breath. Dizziness. Fatigue. You have significant pain or soreness after exercising. Get help right away if: You have chest pain. You have difficulty breathing. These symptoms may represent a serious problem that is an emergency. Do not wait to see if the symptoms will go away. Get medical help right away. Call your local emergency services (911 in the [place] Do not drive yourself to the hospital. Summary Exercises that you do while sitting (chair exercises) can strengthen your heart, burn calories, and keep muscles and joints healthy. You may benefit from chair exercises if you are unable to do standing exercises due to diabetic foot pain,  obesity, recovery from surgery or injury, or other conditions. Before starting chair exercises, check with your health care provider or a physical therapist to find out how much exercise you can tolerate and which exercises are safe for you. This information is not intended to replace advice given to you by your health care provider. Make sure you discuss any questions you have with your health care provider. Document Revised: 07/13/2020 Document Reviewed: 07/13/2020 Elsevier Patient Education  2022 [employer].

## 2021-04-21 ENCOUNTER — Other Ambulatory Visit: Payer: Self-pay

## 2021-04-22 ENCOUNTER — Other Ambulatory Visit: Payer: Self-pay

## 2021-04-27 ENCOUNTER — Other Ambulatory Visit: Payer: Self-pay

## 2021-04-29 ENCOUNTER — Other Ambulatory Visit: Payer: Self-pay

## 2021-04-30 ENCOUNTER — Telehealth: Payer: Self-pay | Admitting: Internal Medicine

## 2021-04-30 NOTE — Telephone Encounter (Signed)
Pt is calling to ask can she do labs with a self swab? Please advise CB- [phone]

## 2021-05-01 NOTE — Telephone Encounter (Signed)
Called and made appointment

## 2021-05-05 ENCOUNTER — Ambulatory Visit: Payer: Medicare Other | Attending: Internal Medicine | Admitting: Internal Medicine

## 2021-05-05 ENCOUNTER — Other Ambulatory Visit: Payer: Self-pay

## 2021-05-05 ENCOUNTER — Other Ambulatory Visit (HOSPITAL_COMMUNITY)
Admission: RE | Admit: 2021-05-05 | Discharge: 2021-05-05 | Disposition: A | Discharge status: Home / Self Care | Payer: Medicare Other | Source: Ambulatory Visit | Attending: Internal Medicine | Admitting: Internal Medicine

## 2021-05-05 DIAGNOSIS — R300 Dysuria: Secondary | ICD-10-CM

## 2021-05-05 DIAGNOSIS — N761 Subacute and chronic vaginitis: Secondary | ICD-10-CM | POA: Diagnosis not present

## 2021-05-05 MED ORDER — FLUCONAZOLE 150 MG PO TABS
150.0000 mg | ORAL_TABLET | Freq: Every day | ORAL | 0 refills | Status: DC
  Filled 2021-05-05: qty 2, 2d supply, fill #0

## 2021-05-05 NOTE — Progress Notes (Signed)
Patient ID: Kimberly Frazier, female   DOB: 03-05-1961, 60 y.o.   MRN: [id number] Virtual Visit via Telephone Note  I connected with Kimberly Frazier on 05/05/2021 at 8:21 AM by telephone and verified that I am speaking with the correct person using two identifiers  Location: Patient: home Provider: office  Participants: Myself Patient   I discussed the limitations, risks, security and privacy concerns of performing an evaluation and management service by telephone and the availability of in person appointments. I also discussed with the patient that there may be a patient responsible charge related to this service. The patient expressed understanding and agreed to proceed.   History of Present Illness: Patient with history of DM with neuropathy and retinopathy, HTN with hypertensive heart disease, CAD (Coronary CT revealed occlusion in the mid to distal LAD, distal RCA.  Cardiology states that she will need CABG eventually), HL, obesity, endometrial CA s/p hysterectomy.  This is an UC visit.  Patient complains of intense vaginal itching for several weeks.  She did a self swab the end of September and was found to have yeast and bacterial vaginosis.  She was treated with Diflucan and Flagyl.  Patient states that the itching came under control but did not completely resolve.  She is not sexually active.  She states she is having to scratch herself so much that the urine burns the labia when it comes out.  Of note, she was also treated for vaginal yeast infection 12/29/2020.  Patient is on Jardiance.   Outpatient Encounter Medications as of 05/05/2021  Medication Sig   Accu-Chek Softclix Lancets lancets Use to check blood sugar 3 times daily.   amLODipine (NORVASC) 10 MG tablet TAKE 1 TABLET (10 MG TOTAL) BY MOUTH DAILY.   aspirin EC 81 MG tablet Take 1 tablet (81 mg total) daily by mouth.   atorvastatin (LIPITOR) 80 MG tablet Take 1 tablet (80 mg total) by mouth daily.   Blood Glucose Monitoring Suppl  (ACCU-CHEK GUIDE) w/Device KIT 1 kit by Does not apply route in the morning, at noon, and at bedtime. Use to check blood sugar 3 times daily.   Bromfenac Sodium (PROLENSA) 0.07 % SOLN Place 1 drop into both eyes 4 (four) times daily.   butalbital-acetaminophen-caffeine (FIORICET) 50-325-40 MG tablet Take 1 tablet by mouth every 6 (six) hours as needed for headache.   carvedilol (COREG) 25 MG tablet TAKE 1 TABLET (25 MG TOTAL) BY MOUTH 2 (TWO) TIMES DAILY WITH A MEAL.   empagliflozin (JARDIANCE) 10 MG TABS tablet TAKE 1 TABLET (10 MG TOTAL) BY MOUTH DAILY.   glucose blood (ACCU-CHEK GUIDE) test strip Use to check blood sugar 3 times daily.   insulin aspart (NOVOLOG) 100 UNIT/ML injection Inject 28 Units into the skin 3 (three) times daily before meals.   insulin glargine (LANTUS SOLOSTAR) 100 UNIT/ML Solostar Pen INJECT 58 UNITS INTO THE SKIN DAILY.   Insulin Pen Needle 32G X 4 MM MISC Use as directed.   Insulin Syringe-Needle U-100 (BD INSULIN SYRINGE) 29G X 1/2" 0.3 ML MISC Use to inject Novolog TID.   Insulin Syringe-Needle U-100 31G X 15/64" 0.3 ML MISC USE TO INJECT NOVOLOG THREE TIMES DAILY   isosorbide mononitrate (IMDUR) 60 MG 24 hr tablet TAKE 1 TABLET (60 MG TOTAL) BY MOUTH DAILY.   ketorolac (ACULAR) 0.5 % ophthalmic solution Place 1 drop into both eyes 4 (four) times daily.   ketorolac (ACULAR) 0.5 % ophthalmic solution Place 1 drop into both eyes 4 (four) times daily.  Na Sulfate-K Sulfate-Mg Sulf 17.5-3.13-1.6 GM/177ML SOLN Suprep (no substitutions)-TAKE AS DIRECTED.   nitroGLYCERIN (NITROSTAT) 0.4 MG SL tablet Place 1 tab under tongue for chest pain.  May repeat after 5 minutes x 2.  DO NOT TAKE MORE THAN 3 TABS DURING AN EPISODE OF CHEST PAIN (Patient taking differently: Place 1 tab under tongue for chest pain.  May repeat after 5 minutes x 2.  DO NOT TAKE MORE THAN 3 TABS DURING AN EPISODE OF CHEST PAIN)   oxybutynin (DITROPAN-XL) 5 MG 24 hr tablet Take 1 tablet (5 mg total) by mouth  at bedtime.   potassium chloride (KLOR-CON) 10 MEQ tablet TAKE 1 TABLET BY MOUTH EVERY DAY   prednisoLONE acetate (PRED FORTE) 1 % ophthalmic suspension Place 1 drop into both eyes 4 (four) times daily.   prednisoLONE acetate (PRED FORTE) 1 % ophthalmic suspension Place 1 drop into both eyes 4 (four) times daily.   No facility-administered encounter medications on file as of 05/05/2021.      Observations/Objective: No direct observation done as this was a telephone visit.  Assessment and Plan: 1. Subacute vaginitis I suspect that she has recurrent yeast infection.  We will treat with Diflucan.  Inform patient that [name] can cause recurrent yeast and urinary tract infections including.  At this time I would recommend stopping the Jardiance.  However this means she will need to be very compliant with taking the NovoLog with meals.  Patient expressed understanding. - fluconazole (DIFLUCAN) 150 MG tablet; Take 1 tablet (150 mg total) by mouth daily.  Dispense: 2 tablet; Refill: 0 - Cervicovaginal ancillary only  2. Dysuria Likely due to vaginal yeast but we will check urine. - Urinalysis, Routine w reflex microscopic   Follow Up Instructions: As previously scheduled.   I discussed the assessment and treatment plan with the patient. The patient was provided an opportunity to ask questions and all were answered. The patient agreed with the plan and demonstrated an understanding of the instructions.   The patient was advised to call back or seek an in-person evaluation if the symptoms worsen or if the condition fails to improve as anticipated.  I  Spent 11 minutes on this telephone encounter  Karle Plumber, MD

## 2021-05-14 ENCOUNTER — Other Ambulatory Visit: Payer: Self-pay | Admitting: General Practice

## 2021-05-14 ENCOUNTER — Other Ambulatory Visit: Payer: Self-pay

## 2021-05-14 MED ORDER — ATORVASTATIN CALCIUM 80 MG PO TABS
80.0000 mg | ORAL_TABLET | Freq: Every day | ORAL | 3 refills | Status: DC
  Filled 2021-05-14: qty 30, 30d supply, fill #0
  Filled 2021-06-28: qty 30, 30d supply, fill #1
  Filled 2021-06-29: qty 30, 30d supply, fill #0
  Filled 2021-08-11: qty 30, 30d supply, fill #1
  Filled 2021-09-28: qty 30, 30d supply, fill #2

## 2021-05-14 NOTE — Progress Notes (Deleted)
[facility] Note  04/20/2021     CHIEF COMPLAINT Patient presents for Retina Follow Up   HISTORY OF PRESENT ILLNESS: Kimberly Frazier is a 60 y.o. female who presents to the clinic today for:   HPI     Retina Follow Up           Diagnosis: Diabetic Retinopathy   Laterality: both eyes   Duration: 4 weeks   Course: stable         Comments   4 week follow up NPDR OU- Vision appears stable OU.  Using Prednisolone and Ketorolac QID OU. Refresh prn. BS 276 (Stopped Jardiance.  BS has been elevated since then x8 days ago) A1C unsure      Last edited by Leonie Douglas, [place] on 05/18/2021  7:45 AM.     Pts dr took her off Jardiance 8 days ago due to a possible allergic reaction, pt was not put on a different med, she states her BG has been high since then, she is still taking Ketorolac and PF QID OU, she does not feel like she did that well on the eye chart this morning   Referring physician: Ladell Pier, MD [place],  [place] [id number]  HISTORICAL INFORMATION:   Selected notes from the MEDICAL RECORD NUMBER Referred by Dr. Rutherford Guys for concern of CME   CURRENT MEDICATIONS: Current Outpatient Medications (Ophthalmic Drugs)  Medication Sig   Bromfenac Sodium (PROLENSA) 0.07 % SOLN Place 1 drop into both eyes 4 (four) times daily.   ketorolac (ACULAR) 0.5 % ophthalmic solution Place 1 drop into both eyes 4 (four) times daily.   ketorolac (ACULAR) 0.5 % ophthalmic solution Place 1 drop into both eyes 4 (four) times daily.   prednisoLONE acetate (PRED FORTE) 1 % ophthalmic suspension Place 1 drop into both eyes 4 (four) times daily.   prednisoLONE acetate (PRED FORTE) 1 % ophthalmic suspension Place 1 drop into both eyes 4 (four) times daily.   No current facility-administered medications for this visit. (Ophthalmic Drugs)   Current Outpatient Medications (Other)  Medication Sig   Accu-Chek Softclix Lancets lancets Use to  check blood sugar 3 times daily.   amLODipine (NORVASC) 10 MG tablet TAKE 1 TABLET (10 MG TOTAL) BY MOUTH DAILY.   aspirin EC 81 MG tablet Take 1 tablet (81 mg total) daily by mouth.   atorvastatin (LIPITOR) 80 MG tablet Take 1 tablet (80 mg total) by mouth daily.   Blood Glucose Monitoring Suppl (ACCU-CHEK GUIDE) w/Device KIT 1 kit by Does not apply route in the morning, at noon, and at bedtime. Use to check blood sugar 3 times daily.   butalbital-acetaminophen-caffeine (FIORICET) 50-325-40 MG tablet Take 1 tablet by mouth every 6 (six) hours as needed for headache.   carvedilol (COREG) 25 MG tablet TAKE 1 TABLET (25 MG TOTAL) BY MOUTH 2 (TWO) TIMES DAILY WITH A MEAL.   empagliflozin (JARDIANCE) 10 MG TABS tablet TAKE 1 TABLET (10 MG TOTAL) BY MOUTH DAILY.   glucose blood (ACCU-CHEK GUIDE) test strip Use to check blood sugar 3 times daily.   insulin aspart (NOVOLOG) 100 UNIT/ML injection Inject 28 Units into the skin 3 (three) times daily before meals.   insulin glargine (LANTUS SOLOSTAR) 100 UNIT/ML Solostar Pen INJECT 58 UNITS INTO THE SKIN DAILY.   Insulin Pen Needle 32G X 4 MM MISC Use as directed.   Insulin Syringe-Needle U-100 (BD INSULIN SYRINGE) 29G X 1/2" 0.3 ML MISC  Use to inject Novolog TID.   Insulin Syringe-Needle U-100 31G X 15/64" 0.3 ML MISC USE TO INJECT NOVOLOG THREE TIMES DAILY   isosorbide mononitrate (IMDUR) 60 MG 24 hr tablet TAKE 1 TABLET (60 MG TOTAL) BY MOUTH DAILY.   Na Sulfate-K Sulfate-Mg Sulf 17.5-3.13-1.6 GM/177ML SOLN Suprep (no substitutions)-TAKE AS DIRECTED.   nitroGLYCERIN (NITROSTAT) 0.4 MG SL tablet Place 1 tab under tongue for chest pain.  May repeat after 5 minutes x 2.  DO NOT TAKE MORE THAN 3 TABS DURING AN EPISODE OF CHEST PAIN (Patient taking differently: Place 1 tab under tongue for chest pain.  May repeat after 5 minutes x 2.  DO NOT TAKE MORE THAN 3 TABS DURING AN EPISODE OF CHEST PAIN)   oxybutynin (DITROPAN-XL) 5 MG 24 hr tablet Take 1 tablet (5 mg  total) by mouth at bedtime.   potassium chloride (KLOR-CON) 10 MEQ tablet TAKE 1 TABLET BY MOUTH EVERY DAY   No current facility-administered medications for this visit. (Other)   REVIEW OF SYSTEMS: ROS   Positive for: Endocrine, Cardiovascular, Eyes Negative for: Constitutional, Gastrointestinal, Neurological, Skin, Genitourinary, Musculoskeletal, HENT, Respiratory, Psychiatric, Allergic/Imm, Heme/Lymph Last edited by Debbrah Alar, COT on 04/20/2021  8:07 AM.     ALLERGIES No Known Allergies  PAST MEDICAL HISTORY Past Medical History:  Diagnosis Date   Adrenal adenoma, left    Arthritis    Cancer ([place])    CKD (chronic kidney disease), stage II    Coronary artery disease 07-28-2018 followed [facility]  currently due to no insurance   per cardiac cath 02-06-2015 (positive mild lateral ishcemia on stress test)--- dLAD 80%,  mLAD 40%,  mPDA 80% (small vessel),  ostial D1 70%,  CFx with lumial irregarlities-- medical management   Depression    Diabetic neuropathy ([place])    Diabetic retinopathy ([place])    NPDR OU   Headache    History of Bell's palsy 07/2011   per pt residual facial pain on left side occasionally   History of cancer of vagina 1999   per pt completed radiation and chemo   History of sepsis 12/30/2017   positive blood culter fro E.coli   Hyperlipidemia    Hypertension    Hypertensive retinopathy    OU   Hypokalemia    Insulin dependent type 2 diabetes mellitus, uncontrolled    followed by pcp---  A1c was 11.7 on 06-15-2018 in epic   Myocardial infarction [facility])    10 yrs ago?   Nocturia    Peripheral neuropathy    PMB (postmenopausal bleeding)    Wears dentures    upper   Wears glasses    Past Surgical History:  Procedure Laterality Date   BREAST BIOPSY  2012   benign   CARDIAC CATHETERIZATION N/A 02/06/2015   Procedure: Left Heart Cath and Coronary Angiography;  Surgeon: Larey Dresser, MD;  Location: [facility];  Service:  Cardiovascular;  Laterality: N/A;   CATARACT EXTRACTION Bilateral OD: 03/07/19, OS: 02/28/19   Dr. Gershon Crane   CATARACT EXTRACTION, BILATERAL     COLONOSCOPY     normal exam in [place], [place] 10-12 years ago   EYE SURGERY Bilateral OD: 03/07/19, OS: 02/28/19   Cat Sx - Dr. Gershon Crane   HYSTEROSCOPY WITH D & C N/A 08/01/2018   Procedure: DILATATION AND CURETTAGE /HYSTEROSCOPY;  Surgeon: Mora Bellman, MD;  Location: [facility];  Service: Gynecology;  Laterality: N/A;   ROBOTIC ASSISTED TOTAL HYSTERECTOMY WITH  BILATERAL SALPINGO OOPHERECTOMY N/A 11/28/2018   Procedure: XI ROBOTIC ASSISTED TOTAL HYSTERECTOMY WITH BILATERAL SALPINGO OOPHORECTOMY;  Surgeon: Everitt Amber, MD;  Location: [facility];  Service: Gynecology;  Laterality: N/A;   SENTINEL NODE BIOPSY N/A 11/28/2018   Procedure: SENTINEL NODE BIOPSY;  Surgeon: Everitt Amber, MD;  Location: [facility];  Service: Gynecology;  Laterality: N/A;   UMBILICAL HERNIA REPAIR  child    FAMILY HISTORY Family History  Problem Relation Age of Onset   Heart disease Mother    Hypertension Mother    Diabetes Mother    Cancer Father        hodgkins lymphoma   Heart disease Sister        heart attack   Diabetes Sister    Colon cancer Brother 80   Diabetes Maternal Aunt    Diabetes Maternal Uncle    Cancer Other        parent   Diabetes Other        parent   Heart disease Other        parent   Hyperlipidemia Other        parent   Hypertension Other        parent   Arthritis Other        parent   Breast cancer Neg Hx    Colon polyps Neg Hx    Esophageal cancer Neg Hx    Rectal cancer Neg Hx    Stomach cancer Neg Hx    SOCIAL HISTORY Social History   Tobacco Use   Smoking status: Never   Smokeless tobacco: Never  Vaping Use   Vaping Use: Never used  Substance Use Topics   Alcohol use: Not Currently   Drug use: Yes    Types: Marijuana    Comment: every 2 weeks per pt       OPHTHALMIC EXAM: Base Eye Exam     Visual  Acuity (Snellen - Linear)       Right Left   Dist [place] 20/20 -1 20/150 -2   Dist ph [place] NI NI         Tonometry (Tonopen, 8:14 AM)       Right Left   Pressure 19 19         Pupils       Dark Light Shape React APD   Right 3 2 Round Brisk None   Left 3 2 Round Brisk None         Visual Fields (Counting fingers)       Left Right    Full Full         Extraocular Movement       Right Left    Full, Ortho Full, Ortho         Neuro/Psych     Oriented x3: Yes   Mood/Affect: Normal         Dilation     Both eyes: 1.0% Mydriacyl, 2.5% Phenylephrine @ 8:14 AM             1,2. Moderate Non-proliferative diabetic retinopathy, OU OD with combination DME/CME  **delayed to follow up from September 2021 to April 2022--7 mos instead of 4 wks**  - exam shows scattered IRH, edema and tortuous blood vessels OU  - FA (10.23.20) shows leaking MA OU and paracentral petaloid hyperfluorecence OD --> CME/[place]  - OCT shows diabetic macular edema, OU; OD with CME/[place] component contributing as well  - s/p IVA OS #1 (10.23.20), #2 (11.18.20), #3 (12.18.20), #  4 (02.24.21), #5 (03.24.21)  - s/p IVA OD #1 (11.06.20), #2 (12.18.20), #3 (02.24.21), #4 (03.24.21), #5 (04.21.21), #6 (5.26.21), #7 (07.26.21), #8 (08.25.21), #9 (09.23.21), #10 (04.26.22)  - s/p IVE OS #1 (04.21.21), #2 (5.26.21), #3 (07.26.21), #4 (08.25.21), #5 (09.23.21), #6 (04.26.22), #7 (05.25.22), #8 (07.01.22), #9 (07.29.22), #10 (08.29.22 - JDM), #11 (09.26.22), #12 (10.24.22)  - s/p IVE OD #1 (05.25.22), #2 (07.01.22), #3 (07.29.22), #4 (08.29.22 - JDM), #5 (09.26.22), #6 (10.24.22), #7 (11.21.22)  - s/p Vabysmo OS #1 (11.21.22)  - BCVA OD 20/20; OS 20/150 improved from 20/200  - OCT today shows OD: persistent IRF/IRHM -- slightly improved; OS: Persistent IRF/IRHM IT -- slightly increased  - recommend IVE OD #8 and IV Vabysmo OS #2 today, 12.19.22  - pt wishes to proceed w/ injections  - RBA of  procedure discussed, questions answered  - informed consent obtained  - [place] informed consent form signed and scanned on 12.18.2020  - Eylea OS informed consent form signed and scanned on 4.21.2021  - Eylea OD informed consent form signed and scanned on 05.25.22  - Vabysmo OS consent form signed and scanned on 11.21.22  - see procedure note  - Eylea4U benefits investigation started, 04.21.21  - cont Pred Forte and Ketorolac QID OU -- decrease PF to BID OU  - f/u in 4 weeks, DFE, OCT, possible injection OU  3,4. Hypertensive retinopathy OU  - discussed importance of tight BP control  - monitor  5. Pseudophakia OU  - s/p CE/IOL OU Gershon Crane)   OD: 03/07/2019   OS: 02/28/2019  - IOLs in good position  - CME OU as above  - monitor  6. Ocular Hypertension OU  - IOP: 24 OU (applanation) - ?steroid response  - start Cosopt BID OU    This document serves as a record of services personally performed by Gardiner Sleeper, MD, PhD. It was created on their behalf by San Jetty. Owens Shark, OA an ophthalmic technician. The creation of this record is the provider's dictation and/or activities during the visit.    Electronically signed by: San Jetty. Owens Shark, [place] 12.19.2022 8:15 AM     Gardiner Sleeper, M.D., Ph.D. Diseases & Surgery of the Retina and Vitreous [facility]: M myopia (nearsighted); A astigmatism; H hyperopia (farsighted); P presbyopia; Mrx spectacle prescription;  CTL contact lenses; OD right eye; OS left eye; OU both eyes  XT exotropia; ET esotropia; PEK punctate epithelial keratitis; PEE punctate epithelial erosions; DES dry eye syndrome; MGD meibomian gland dysfunction; ATs artificial tears; PFAT's preservative free artificial tears; [place] nuclear sclerotic cataract; PSC posterior subcapsular cataract; ERM epi-retinal membrane; PVD posterior vitreous detachment; RD retinal detachment; DM diabetes mellitus; DR diabetic retinopathy; NPDR  non-proliferative diabetic retinopathy; PDR proliferative diabetic retinopathy; CSME clinically significant macular edema; DME diabetic macular edema; dbh dot blot hemorrhages; CWS cotton wool spot; POAG primary open angle glaucoma; C/D cup-to-disc ratio; HVF humphrey visual field; GVF goldmann visual field; OCT optical coherence tomography; IOP intraocular pressure; BRVO Branch retinal vein occlusion; CRVO central retinal vein occlusion; CRAO central retinal artery occlusion; BRAO branch retinal artery occlusion; RT retinal tear; SB scleral buckle; PPV pars plana vitrectomy; VH Vitreous hemorrhage; PRP panretinal laser photocoagulation; IVK intravitreal kenalog; VMT vitreomacular traction; MH Macular hole;  NVD neovascularization of the disc; NVE neovascularization elsewhere; AREDS age related eye disease study; ARMD age related macular degeneration; POAG primary open angle glaucoma; EBMD epithelial/anterior basement membrane dystrophy; ACIOL anterior chamber intraocular lens;  IOL intraocular lens; PCIOL posterior chamber intraocular lens; Phaco/IOL phacoemulsification with intraocular lens placement; [place] photorefractive keratectomy; LASIK laser assisted in situ keratomileusis; HTN hypertension; DM diabetes mellitus; COPD chronic obstructive pulmonary disease

## 2021-05-15 ENCOUNTER — Other Ambulatory Visit: Payer: Self-pay | Admitting: Internal Medicine

## 2021-05-15 ENCOUNTER — Other Ambulatory Visit: Payer: Self-pay

## 2021-05-15 DIAGNOSIS — E1165 Type 2 diabetes mellitus with hyperglycemia: Secondary | ICD-10-CM

## 2021-05-15 MED ORDER — LANTUS SOLOSTAR 100 UNIT/ML ~~LOC~~ SOPN
PEN_INJECTOR | SUBCUTANEOUS | 0 refills | Status: DC
  Filled 2021-05-15: qty 15, 26d supply, fill #0

## 2021-05-16 ENCOUNTER — Telehealth: Payer: Self-pay | Admitting: Internal Medicine

## 2021-05-16 DIAGNOSIS — E876 Hypokalemia: Secondary | ICD-10-CM

## 2021-05-17 NOTE — Telephone Encounter (Signed)
last RF 01/07/21 #90 1 RF  Requested Prescriptions  Refused Prescriptions Disp Refills   potassium chloride (KLOR-CON) 10 MEQ tablet [Pharmacy Med Name: POTASSIUM CL ER 10 MEQ TABLET] 90 tablet 1    Sig: TAKE 1 TABLET BY [place] DAY     Endocrinology:  Minerals - Potassium Supplementation Failed - 05/16/2021 12:13 PM      Failed - Cr in normal range and within 360 days    Creatinine, Ser  Date Value Ref Range Status  01/30/2021 1.14 (H) 0.57 - 1.00 mg/dL Final   Creatinine,U  Date Value Ref Range Status  07/15/2015 107.0 mg/dL Final   Creatinine, Urine  Date Value Ref Range Status  01/01/2018 52.71 mg/dL Final         Passed - K in normal range and within 360 days    Potassium  Date Value Ref Range Status  01/30/2021 3.7 3.5 - 5.2 mmol/L Final         Passed - Valid encounter within last 12 months    Recent Outpatient Visits          1 week ago Subacute vaginitis   [place], Deborah B, MD   3 months ago Type 2 diabetes mellitus with morbid obesity [facility])   [place], MD   4 months ago [place], MD   7 months ago Essential hypertension   [place], Jarome Matin, RPH-CPP   8 months ago Encounter for [employer] annual wellness exam   [place], Deborah B, MD      Future Appointments            In 1 month Wynetta Emery, Dalbert Batman, MD Kimberly

## 2021-05-18 ENCOUNTER — Other Ambulatory Visit: Payer: Self-pay

## 2021-05-18 ENCOUNTER — Encounter (INDEPENDENT_AMBULATORY_CARE_PROVIDER_SITE_OTHER): Payer: Self-pay | Admitting: Ophthalmology

## 2021-05-18 ENCOUNTER — Ambulatory Visit (INDEPENDENT_AMBULATORY_CARE_PROVIDER_SITE_OTHER): Payer: Medicare Other | Admitting: Ophthalmology

## 2021-05-18 DIAGNOSIS — H35353 Cystoid macular degeneration, bilateral: Secondary | ICD-10-CM | POA: Diagnosis not present

## 2021-05-18 DIAGNOSIS — Z961 Presence of intraocular lens: Secondary | ICD-10-CM

## 2021-05-18 DIAGNOSIS — H35033 Hypertensive retinopathy, bilateral: Secondary | ICD-10-CM | POA: Diagnosis not present

## 2021-05-18 DIAGNOSIS — H40053 Ocular hypertension, bilateral: Secondary | ICD-10-CM

## 2021-05-18 DIAGNOSIS — E113313 Type 2 diabetes mellitus with moderate nonproliferative diabetic retinopathy with macular edema, bilateral: Secondary | ICD-10-CM | POA: Diagnosis not present

## 2021-05-18 DIAGNOSIS — I10 Essential (primary) hypertension: Secondary | ICD-10-CM | POA: Diagnosis not present

## 2021-05-18 MED ORDER — FARICIMAB-SVOA 6 MG/0.05ML IZ SOLN
6.0000 mg | INTRAVITREAL | Status: AC | PRN
Start: 2021-05-18 — End: 2021-05-18
  Administered 2021-05-18: 09:00:00 6 mg via INTRAVITREAL

## 2021-05-18 MED ORDER — DORZOLAMIDE HCL-TIMOLOL MAL 2-0.5 % OP SOLN
1.0000 [drp] | Freq: Two times a day (BID) | OPHTHALMIC | 1 refills | Status: DC
  Filled 2021-05-18: qty 10, 50d supply, fill #0
  Filled 2021-08-26: qty 10, 75d supply, fill #0

## 2021-05-18 MED ORDER — AFLIBERCEPT 2MG/0.05ML IZ SOLN FOR KALEIDOSCOPE
2.0000 mg | INTRAVITREAL | Status: AC | PRN
Start: 2021-05-18 — End: 2021-05-18
  Administered 2021-05-18: 09:00:00 2 mg via INTRAVITREAL

## 2021-05-18 NOTE — Progress Notes (Signed)
[facility] Note  05/18/2021     CHIEF COMPLAINT Patient presents for Retina Follow Up   HISTORY OF PRESENT ILLNESS: Kimberly Frazier is a 60 y.o. female who presents to the clinic today for:   HPI     Retina Follow Up   Patient presents with  Diabetic Retinopathy.  In both eyes.  Duration of 4 weeks.  Since onset it is stable.  I, the attending physician,  performed the HPI with the patient and updated documentation appropriately.        Comments   4 week follow up NPDR OU- Vision appears stable OU.  Using Prednisolone and Ketorolac QID OU. Refresh prn. BS 276 (Stopped Jardiance.  BS has been elevated since then x8 days ago) A1C unsure      Last edited by Bernarda Caffey, MD on 05/18/2021  8:41 AM.    Pts dr took her off Jardiance 8 days ago due to a possible allergic reaction, pt was not put on a different med, she states her BG has been high since then, she is still taking Ketorolac and PF QID OU, she does not feel like she did that well on the eye chart this morning  Referring physician: Ladell Pier, MD [place],   [id number]  HISTORICAL INFORMATION:   Selected notes from the MEDICAL RECORD NUMBER Referred by Dr. Rutherford Guys for concern of CME    CURRENT MEDICATIONS: Current Outpatient Medications (Ophthalmic Drugs)  Medication Sig   dorzolamide-timolol (COSOPT) 22.3-6.8 MG/ML ophthalmic solution Place 1 drop into both eyes 2 (two) times daily.   ketorolac (ACULAR) 0.5 % ophthalmic solution Place 1 drop into both eyes 4 (four) times daily.   prednisoLONE acetate (PRED FORTE) 1 % ophthalmic suspension Place 1 drop into both eyes 4 (four) times daily.   Bromfenac Sodium (PROLENSA) 0.07 % SOLN Place 1 drop into both eyes 4 (four) times daily. (Patient not taking: Reported on 05/18/2021)   ketorolac (ACULAR) 0.5 % ophthalmic solution Place 1 drop into both eyes 4 (four) times daily.   prednisoLONE acetate (PRED  FORTE) 1 % ophthalmic suspension Place 1 drop into both eyes 4 (four) times daily.   No current facility-administered medications for this visit. (Ophthalmic Drugs)   Current Outpatient Medications (Other)  Medication Sig   amLODipine (NORVASC) 10 MG tablet TAKE 1 TABLET (10 MG TOTAL) BY MOUTH DAILY.   aspirin EC 81 MG tablet Take 1 tablet (81 mg total) daily by mouth.   atorvastatin (LIPITOR) 80 MG tablet Take 1 tablet (80 mg total) by mouth daily.   butalbital-acetaminophen-caffeine (FIORICET) 50-325-40 MG tablet Take 1 tablet by mouth every 6 (six) hours as needed for headache.   carvedilol (COREG) 25 MG tablet TAKE 1 TABLET (25 MG TOTAL) BY MOUTH 2 (TWO) TIMES DAILY WITH A MEAL.   fluconazole (DIFLUCAN) 150 MG tablet Take 1 tablet (150 mg total) by mouth daily.   insulin aspart (NOVOLOG) 100 UNIT/ML injection Inject 28 Units into the skin 3 (three) times daily before meals.   insulin glargine (LANTUS SOLOSTAR) 100 UNIT/ML Solostar Pen INJECT 58 UNITS INTO THE SKIN DAILY.   Na Sulfate-K Sulfate-Mg Sulf 17.5-3.13-1.6 GM/177ML SOLN Suprep (no substitutions)-TAKE AS DIRECTED.   nitroGLYCERIN (NITROSTAT) 0.4 MG SL tablet Place 1 tab under tongue for chest pain.  May repeat after 5 minutes x 2.  DO NOT TAKE MORE THAN 3 TABS DURING AN EPISODE OF CHEST PAIN (Patient taking differently: Place 1  tab under tongue for chest pain.  May repeat after 5 minutes x 2.  DO NOT TAKE MORE THAN 3 TABS DURING AN EPISODE OF CHEST PAIN)   oxybutynin (DITROPAN-XL) 5 MG 24 hr tablet Take 1 tablet (5 mg total) by mouth at bedtime.   potassium chloride (KLOR-CON) 10 MEQ tablet TAKE 1 TABLET BY MOUTH EVERY DAY   Accu-Chek Softclix Lancets lancets Use to check blood sugar 3 times daily.   Blood Glucose Monitoring Suppl (ACCU-CHEK GUIDE) w/Device KIT 1 kit by Does not apply route in the morning, at noon, and at bedtime. Use to check blood sugar 3 times daily.   empagliflozin (JARDIANCE) 10 MG TABS tablet TAKE 1 TABLET (10  MG TOTAL) BY MOUTH DAILY. (Patient not taking: Reported on 05/18/2021)   glucose blood (ACCU-CHEK GUIDE) test strip Use to check blood sugar 3 times daily.   Insulin Pen Needle 32G X 4 MM MISC Use as directed.   Insulin Syringe-Needle U-100 (BD INSULIN SYRINGE) 29G X 1/2" 0.3 ML MISC Use to inject Novolog TID.   isosorbide mononitrate (IMDUR) 60 MG 24 hr tablet TAKE 1 TABLET (60 MG TOTAL) BY MOUTH DAILY.   No current facility-administered medications for this visit. (Other)   REVIEW OF SYSTEMS: ROS   Positive for: Endocrine, Cardiovascular, Eyes Negative for: Constitutional, Gastrointestinal, Neurological, Skin, Genitourinary, Musculoskeletal, HENT, Respiratory, Psychiatric, Allergic/Imm, Heme/Lymph Last edited by Leonie Douglas, COA on 05/18/2021  7:42 AM.     ALLERGIES No Known Allergies  PAST MEDICAL HISTORY Past Medical History:  Diagnosis Date   Adrenal adenoma, left    Arthritis    Cancer ([place])    CKD (chronic kidney disease), stage II    Coronary artery disease 07-28-2018 followed [facility])  currently due to no insurance   per cardiac cath 02-06-2015 (positive mild lateral ishcemia on stress test)--- dLAD 80%,  mLAD 40%,  mPDA 80% (small vessel),  ostial D1 70%,  CFx with lumial irregarlities-- medical management   Depression    Diabetic neuropathy ([place])    Diabetic retinopathy ([place])    NPDR OU   Headache    History of Bell's palsy 07/2011   per pt residual facial pain on left side occasionally   History of cancer of vagina 1999   per pt completed radiation and chemo   History of sepsis 12/30/2017   positive blood culter fro E.coli   Hyperlipidemia    Hypertension    Hypertensive retinopathy    OU   Hypokalemia    Insulin dependent type 2 diabetes mellitus, uncontrolled    followed by pcp---  A1c was 11.7 on 06-15-2018 in epic   Myocardial infarction [facility])    10 yrs ago?   Nocturia    Peripheral neuropathy    PMB (postmenopausal bleeding)     Wears dentures    upper   Wears glasses    Past Surgical History:  Procedure Laterality Date   BREAST BIOPSY  2012   benign   CARDIAC CATHETERIZATION N/A 02/06/2015   Procedure: Left Heart Cath and Coronary Angiography;  Surgeon: Larey Dresser, MD;  Location: [facility];  Service: Cardiovascular;  Laterality: N/A;   CATARACT EXTRACTION Bilateral OD: 03/07/19, OS: 02/28/19   Dr. Gershon Crane   CATARACT EXTRACTION, BILATERAL     COLONOSCOPY     normal exam in [place], [place] abour 10-12 years ago   EYE SURGERY Bilateral OD: 03/07/19, OS: 02/28/19   Cat Sx - Dr. Gershon Crane   HYSTEROSCOPY  WITH D & C N/A 08/01/2018   Procedure: DILATATION AND CURETTAGE /HYSTEROSCOPY;  Surgeon: Mora Bellman, MD;  Location: [facility];  Service: Gynecology;  Laterality: N/A;   ROBOTIC ASSISTED TOTAL HYSTERECTOMY WITH BILATERAL SALPINGO OOPHERECTOMY N/A 11/28/2018   Procedure: XI ROBOTIC ASSISTED TOTAL HYSTERECTOMY WITH BILATERAL SALPINGO OOPHORECTOMY;  Surgeon: Everitt Amber, MD;  Location: [facility];  Service: Gynecology;  Laterality: N/A;   SENTINEL NODE BIOPSY N/A 11/28/2018   Procedure: SENTINEL NODE BIOPSY;  Surgeon: Everitt Amber, MD;  Location: [facility];  Service: Gynecology;  Laterality: N/A;   UMBILICAL HERNIA REPAIR  child    FAMILY HISTORY Family History  Problem Relation Age of Onset   Heart disease Mother    Hypertension Mother    Diabetes Mother    Cancer Father        hodgkins lymphoma   Heart disease Sister        heart attack   Diabetes Sister    Colon cancer Brother 7   Diabetes Maternal Aunt    Diabetes Maternal Uncle    Cancer Other        parent   Diabetes Other        parent   Heart disease Other        parent   Hyperlipidemia Other        parent   Hypertension Other        parent   Arthritis Other        parent   Breast cancer Neg Hx    Colon polyps Neg Hx    Esophageal cancer Neg Hx    Rectal cancer Neg Hx    Stomach cancer Neg Hx     SOCIAL  HISTORY Social History   Tobacco Use   Smoking status: Never   Smokeless tobacco: Never  Vaping Use   Vaping Use: Never used  Substance Use Topics   Alcohol use: Not Currently   Drug use: Yes    Types: Marijuana    Comment: every 2 weeks per pt       OPHTHALMIC EXAM:  Base Eye Exam     Visual Acuity (Snellen - Linear)       Right Left   Dist [place] 20/25 +2 20/250   Dist ph [place] 20/20 -1 NI  OS- Patient states the letters will flash in and out.          Tonometry (Tonopen, 7:54 AM)       Right Left   Pressure 27 31         Tonometry #2 (Applanation, 7:54 AM)       Right Left   Pressure 24 24         Pupils       Dark Light Shape React APD   Right 3 2 Round Brisk None   Left 3 2 Round Brisk None         Visual Fields (Counting fingers)       Left Right    Full Full         Extraocular Movement       Right Left    Full Full         Neuro/Psych     Oriented x3: Yes   Mood/Affect: Normal         Dilation     Both eyes: 1.0% Mydriacyl, 2.5% Phenylephrine @ 7:54 AM           Slit Lamp and Fundus Exam  Slit Lamp Exam       Right Left   Lids/Lashes Dermatochalasis - upper lid Dermatochalasis - upper lid   Conjunctiva/Sclera Mild Melanosis Mild Melanosis   Cornea 1+ Punctate epithelial erosions 2+ central punctate epithelial erosions, decreased TBUT, mild tear film debris, well healed temporal cataract wounds   Anterior Chamber Deep, 0.5+cell/pigment Deep, 0.5+cell/pigment   Iris Round and dilated, No NVI Round and dilated, No NVI   Lens PC IOL in good position with trace PCO PC IOL in good position with 1+PCO   Anterior Vitreous Vitreous syneresis Vitreous syneresis, Posterior vitreous detachment, vitreous condensations         Fundus Exam       Right Left   Disc Pallor, Sharp rim Compact, Pink and Sharp, temporal PPA   C/D Ratio 0.1 0.1   Macula Good foveal reflex, persistent edema inferior macula, scattered  Microaneurysms, focal exudates inferior to fovea -- improved Blunted foveal reflex, central edema - persistent, +exudates/MA/IRH -- greatest temporal macula -- slightly increased   Vessels attenuated, Tortuous attenuated, Tortuous   Periphery Attached, scattered drusen, scattered DBH Attached, scattered DBH greatest posteriorly, mild peripheral drusen, prominent DBH inferior disc            IMAGING AND PROCEDURES  Imaging and Procedures for 05/18/2021  OCT, Retina - OU - Both Eyes       Right Eye Quality was good. Central Foveal Thickness: 284. Progression has been stable. Findings include abnormal foveal contour, intraretinal fluid, no SRF, intraretinal hyper-reflective material, vitreomacular adhesion (persistent IRF/IRHM -- slightly improved ).   Left Eye Quality was good. Central Foveal Thickness: 520. Progression has worsened. Findings include abnormal foveal contour, intraretinal fluid, intraretinal hyper-reflective material, vitreomacular adhesion , outer retinal atrophy, subretinal hyper-reflective material, no SRF (Persistent IRF/IRHM IT -- slightly increased).   Notes *Images captured and stored on drive  Diagnosis / Impression:  Macular edema OU - likely combination of CME + DME OU OD: persistent IRF/IRHM -- slightly improved  OS: Persistent IRF/IRHM IT -- slightly increased  Clinical management:  See below  Abbreviations: NFP - Normal foveal profile. CME - cystoid macular edema. PED - pigment epithelial detachment. IRF - intraretinal fluid. SRF - subretinal fluid. EZ - ellipsoid zone. ERM - epiretinal membrane. ORA - outer retinal atrophy. ORT - outer retinal tubulation. SRHM - subretinal hyper-reflective material      Intravitreal Injection, Pharmacologic Agent - OD - Right Eye       Time Out 05/18/2021. 8:16 AM. Confirmed correct patient, procedure, site, and patient consented.   Anesthesia Topical anesthesia was used. Anesthetic medications included  Lidocaine 2%, Proparacaine 0.5%.   Procedure Preparation included 5% betadine to ocular surface, eyelid speculum. A (32g) needle was used.   Injection: 2 mg aflibercept 2 MG/0.05ML   Route: Intravitreal, Site: Right Eye   NDC: A3590391, Lot: [id number], Expiration date: 04/29/2022, Waste: 0.05 mL   Post-op Post injection exam found visual acuity of at least counting fingers. The patient tolerated the procedure well. There were no complications. The patient received written and verbal post procedure care education. Post injection medications were not given.      Intravitreal Injection, Pharmacologic Agent - OS - Left Eye       Time Out 05/18/2021. 8:16 AM. Confirmed correct patient, procedure, site, and patient consented.   Anesthesia Topical anesthesia was used. Anesthetic medications included Lidocaine 2%, Proparacaine 0.5%.   Procedure Preparation included 5% betadine to ocular surface, eyelid speculum. A (32g) needle was used.  Injection: 6 mg faricimab-svoa 6 MG/0.05ML   Route: Intravitreal, Site: Left Eye   NDC: S6832610, Lot: [id number], Expiration date: 12/29/2022, Waste: 0.05 mL   Post-op Post injection exam found visual acuity of at least counting fingers. The patient tolerated the procedure well. There were no complications. The patient received written and verbal post procedure care education.            ASSESSMENT/PLAN:    ICD-10-CM   1. Moderate nonproliferative diabetic retinopathy of both eyes with macular edema associated with type 2 diabetes mellitus (HCC)  E11.3313 OCT, Retina - OU - Both Eyes    Intravitreal Injection, Pharmacologic Agent - OD - Right Eye    Intravitreal Injection, Pharmacologic Agent - OS - Left Eye    faricimab-svoa (VABYSMO) 27m/0.05mL intravitreal injection    aflibercept (EYLEA) SOLN 2 mg    2. CME (cystoid macular edema), bilateral  H35.353     3. Essential hypertension  I10     4. Hypertensive retinopathy of both  eyes  H35.033     5. Pseudophakia of both eyes  Z96.1     6. Bilateral ocular hypertension  H40.053      1,2. Moderate Non-proliferative diabetic retinopathy, OU OD with combination DME/CME             **delayed to follow up from September 2021 to April 2022--7 mos instead of 4 wks**             - exam shows scattered IRH, edema and tortuous blood vessels OU             - FA (10.23.20) shows leaking MA OU and paracentral petaloid hyperfluorecence OD --> CME/[place]             - OCT shows diabetic macular edema, OU; OD with CME/[place] component contributing as well             - s/p IVA OS #1 (10.23.20), #2 (11.18.20), #3 (12.18.20), #4 (02.24.21), #5 (03.24.21)             - s/p IVA OD #1 (11.06.20), #2 (12.18.20), #3 (02.24.21), #4 (03.24.21), #5 (04.21.21), #6 (5.26.21), #7 (07.26.21), #8 (08.25.21), #9 (09.23.21), #10 (04.26.22)             - s/p IVE OS #1 (04.21.21), #2 (5.26.21), #3 (07.26.21), #4 (08.25.21), #5 (09.23.21), #6 (04.26.22), #7 (05.25.22), #8 (07.01.22), #9 (07.29.22), #10 (08.29.22 - JDM), #11 (09.26.22), #12 (10.24.22)             - s/p IVE OD #1 (05.25.22), #2 (07.01.22), #3 (07.29.22), #4 (08.29.22 - JDM), #5 (09.26.22), #6 (10.24.22), #7 (11.21.22)             - s/p Vabysmo OS #1 (11.21.22)             - BCVA OD 20/20; OS 20/150 improved from 20/200             - OCT today shows OD: persistent IRF/IRHM -- slightly improved; OS: Persistent IRF/IRHM IT -- slightly increased             - recommend IVE OD #8 and IV Vabysmo OS #2 today, 12.19.22             - pt wishes to proceed w/ injections             - RBA of procedure discussed, questions answered             - informed consent obtained             -  Avastin informed consent form signed and scanned on 12.18.2020             - Eylea OS informed consent form signed and scanned on 4.21.2021             - Eylea OD informed consent form signed and scanned on 05.25.22             - Vabysmo OS consent form  signed and scanned on 11.21.22             - see procedure note             - Eylea4U benefits investigation started, 04.21.21             - cont Pred Forte and Ketorolac QID OU -- decrease PF to BID OU             - f/u in 4 weeks, DFE, OCT, possible injection OU   3,4. Hypertensive retinopathy OU             - discussed importance of tight BP control             - monitor   5. Pseudophakia OU            - s/p CE/IOL OU Gershon Crane)                        OD: 03/07/2019                        OS: 02/28/2019             - IOLs in good position             - CME OU as above             - monitor   6. Ocular Hypertension OU             - IOP: 24 OU (applanation) - ?steroid response             - start Cosopt BID OU   Ophthalmic Meds Ordered this visit:  Meds ordered this encounter  Medications   dorzolamide-timolol (COSOPT) 22.3-6.8 MG/ML ophthalmic solution    Sig: Place 1 drop into both eyes 2 (two) times daily.    Dispense:  10 mL    Refill:  1   faricimab-svoa (VABYSMO) 20m/0.05mL intravitreal injection   aflibercept (EYLEA) SOLN 2 mg     Return in about 4 weeks (around 06/15/2021) for f/u NPDR OU, DFE, OCT.  There are no Patient Instructions on file for this visit.   Explained the diagnoses, plan, and follow up with the patient and they expressed understanding.  Patient expressed understanding of the importance of proper follow up care.   This document serves as a record of services personally performed by BGardiner Sleeper MD, PhD. It was created on their behalf by ASan Jetty BOwens Shark OA an ophthalmic technician. The creation of this record is the provider's dictation and/or activities during the visit.    Electronically signed by: ASan Jetty BOwens Shark O[place]12.19.2022 8:59 AM   BGardiner Sleeper M.D., Ph.D. Diseases & Surgery of the Retina and Vitreous [place] I have reviewed the above documentation for accuracy and completeness, and I agree with the  above. BGardiner Sleeper M.D., Ph.D. 05/18/21 9:00 AM   Abbreviations: M myopia (nearsighted); A astigmatism; H hyperopia (farsighted); P presbyopia; Mrx  spectacle prescription;  CTL contact lenses; OD right eye; OS left eye; OU both eyes  XT exotropia; ET esotropia; PEK punctate epithelial keratitis; PEE punctate epithelial erosions; DES dry eye syndrome; MGD meibomian gland dysfunction; ATs artificial tears; PFAT's preservative free artificial tears; [place] nuclear sclerotic cataract; PSC posterior subcapsular cataract; ERM epi-retinal membrane; PVD posterior vitreous detachment; RD retinal detachment; DM diabetes mellitus; DR diabetic retinopathy; NPDR non-proliferative diabetic retinopathy; PDR proliferative diabetic retinopathy; CSME clinically significant macular edema; DME diabetic macular edema; dbh dot blot hemorrhages; CWS cotton wool spot; POAG primary open angle glaucoma; C/D cup-to-disc ratio; HVF humphrey visual field; GVF goldmann visual field; OCT optical coherence tomography; IOP intraocular pressure; BRVO Branch retinal vein occlusion; CRVO central retinal vein occlusion; CRAO central retinal artery occlusion; BRAO branch retinal artery occlusion; RT retinal tear; SB scleral buckle; PPV pars plana vitrectomy; VH Vitreous hemorrhage; PRP panretinal laser photocoagulation; IVK intravitreal kenalog; VMT vitreomacular traction; MH Macular hole;  NVD neovascularization of the disc; NVE neovascularization elsewhere; AREDS age related eye disease study; ARMD age related macular degeneration; POAG primary open angle glaucoma; EBMD epithelial/anterior basement membrane dystrophy; ACIOL anterior chamber intraocular lens; IOL intraocular lens; PCIOL posterior chamber intraocular lens; Phaco/IOL phacoemulsification with intraocular lens placement; [place] photorefractive keratectomy; LASIK laser assisted in situ keratomileusis; HTN hypertension; DM diabetes mellitus; COPD chronic obstructive pulmonary disease

## 2021-05-19 MED ORDER — POTASSIUM CHLORIDE ER 10 MEQ PO TBCR
10.0000 meq | EXTENDED_RELEASE_TABLET | Freq: Every day | ORAL | 1 refills | Status: DC
Start: 2021-05-19 — End: 2021-09-30

## 2021-05-19 NOTE — Addendum Note (Signed)
Addended by: Karle Plumber B on: 05/19/2021 04:55 PM   Modules accepted: Orders

## 2021-05-19 NOTE — Telephone Encounter (Signed)
Contacted pt to go over provider message pt states she was taking it 4 times a day then provider decreased it to 2 tablets a day. Per Dr. Wynetta Emery she looked through chart and didn't see anything in regards to it. Per Dr. Wynetta Emery she will change rx to 2 tablets a day. Pt states she is okay with that and she doesn't have any questions or concerns

## 2021-05-19 NOTE — Telephone Encounter (Signed)
Patient called in states she takes 2 a day of potassium and she is out of it now, is why she is asking for the refill. Please call back.

## 2021-06-08 ENCOUNTER — Other Ambulatory Visit: Payer: Self-pay | Admitting: Cardiology

## 2021-06-08 ENCOUNTER — Other Ambulatory Visit: Payer: Self-pay

## 2021-06-08 MED ORDER — ISOSORBIDE MONONITRATE ER 60 MG PO TB24
ORAL_TABLET | Freq: Every day | ORAL | 3 refills | Status: DC
  Filled 2021-06-08: qty 90, 90d supply, fill #0
  Filled 2021-10-22: qty 90, 90d supply, fill #1
  Filled 2022-01-20: qty 90, 90d supply, fill #2
  Filled 2022-03-10: qty 90, 90d supply, fill #3

## 2021-06-09 ENCOUNTER — Other Ambulatory Visit: Payer: Self-pay

## 2021-06-10 ENCOUNTER — Other Ambulatory Visit: Payer: Self-pay | Admitting: Internal Medicine

## 2021-06-10 ENCOUNTER — Other Ambulatory Visit: Payer: Self-pay

## 2021-06-10 DIAGNOSIS — E1165 Type 2 diabetes mellitus with hyperglycemia: Secondary | ICD-10-CM

## 2021-06-10 DIAGNOSIS — Z794 Long term (current) use of insulin: Secondary | ICD-10-CM

## 2021-06-10 NOTE — Telephone Encounter (Signed)
Requested medication (s) are due for refill today - yes  Requested medication (s) are on the active medication list -yes  Future visit scheduled -yes  Last refill: 05/15/21 53ml  Notes to clinic: Request RF- last fill was courtesy fill with notes- request sent for provider review   Requested Prescriptions  Pending Prescriptions Disp Refills   insulin glargine (LANTUS SOLOSTAR) 100 UNIT/ML Solostar Pen 15 mL 0    Sig: INJECT 58 UNITS INTO THE SKIN DAILY.     Endocrinology:  Diabetes - Insulins Failed - 06/10/2021  2:56 PM      Failed - HBA1C is between 0 and 7.9 and within 180 days    HbA1c, POC (controlled diabetic range)  Date Value Ref Range Status  01/30/2021 8.8 (A) 0.0 - 7.0 % Final          Passed - Valid encounter within last 6 months    Recent Outpatient Visits           1 month ago Subacute vaginitis   [place] Karle Plumber B, MD   4 months ago Type 2 diabetes mellitus with morbid obesity [facility])   [place], MD   5 months ago [place], MD   7 months ago Essential hypertension   [place], Jarome Matin, RPH-CPP   8 months ago Encounter for [employer] annual wellness exam   [place], MD       Future Appointments             In 2 weeks Ladell Pier, MD [place]   In 1 month Ladell Pier, MD [place]               Requested Prescriptions  Pending Prescriptions Disp Refills   insulin glargine (LANTUS SOLOSTAR) 100 UNIT/ML Solostar Pen 15 mL 0    Sig: INJECT 58 UNITS INTO THE SKIN DAILY.     Endocrinology:  Diabetes - Insulins Failed - 06/10/2021  2:56 PM      Failed - HBA1C is between 0 and 7.9 and within 180 days    HbA1c, POC (controlled  diabetic range)  Date Value Ref Range Status  01/30/2021 8.8 (A) 0.0 - 7.0 % Final          Passed - Valid encounter within last 6 months    Recent Outpatient Visits           1 month ago Subacute vaginitis   [place], Deborah B, MD   4 months ago Type 2 diabetes mellitus with morbid obesity [facility])   [place], MD   5 months ago [place], MD   7 months ago Essential hypertension   [place], Jarome Matin, RPH-CPP   8 months ago Encounter for [employer] annual wellness exam   [place], Deborah B, MD       Future Appointments             In 2 weeks Ladell Pier, MD [place]   In 1 month Wynetta Emery Dalbert Batman, MD  [place]

## 2021-06-11 ENCOUNTER — Other Ambulatory Visit: Payer: Self-pay

## 2021-06-11 MED ORDER — LANTUS SOLOSTAR 100 UNIT/ML ~~LOC~~ SOPN
PEN_INJECTOR | SUBCUTANEOUS | 0 refills | Status: DC
  Filled 2021-06-11: qty 15, 25d supply, fill #0
  Filled 2021-06-18: qty 15, 29d supply, fill #0

## 2021-06-12 NOTE — Progress Notes (Shared)
[facility] Note  06/15/2021     CHIEF COMPLAINT Patient presents for No chief complaint on file.   HISTORY OF PRESENT ILLNESS: Kimberly Frazier is a 61 y.o. female who presents to the clinic today for:    Pts dr took her off Jardiance 8 days ago due to a possible allergic reaction, pt was not put on a different med, she states her BG has been high since then, she is still taking Ketorolac and PF QID OU, she does not feel like she did that well on the eye chart this morning  Referring physician: Ladell Pier, MD [place],   [id number]  HISTORICAL INFORMATION:   Selected notes from the MEDICAL RECORD NUMBER Referred by Dr. Rutherford Guys for concern of CME    CURRENT MEDICATIONS: Current Outpatient Medications (Ophthalmic Drugs)  Medication Sig   Bromfenac Sodium (PROLENSA) 0.07 % SOLN Place 1 drop into both eyes 4 (four) times daily. (Patient not taking: Reported on 05/18/2021)   dorzolamide-timolol (COSOPT) 22.3-6.8 MG/ML ophthalmic solution Place 1 drop into both eyes 2 (two) times daily.   ketorolac (ACULAR) 0.5 % ophthalmic solution Place 1 drop into both eyes 4 (four) times daily.   ketorolac (ACULAR) 0.5 % ophthalmic solution Place 1 drop into both eyes 4 (four) times daily.   prednisoLONE acetate (PRED FORTE) 1 % ophthalmic suspension Place 1 drop into both eyes 4 (four) times daily.   prednisoLONE acetate (PRED FORTE) 1 % ophthalmic suspension Place 1 drop into both eyes 4 (four) times daily.   No current facility-administered medications for this visit. (Ophthalmic Drugs)   Current Outpatient Medications (Other)  Medication Sig   Accu-Chek Softclix Lancets lancets Use to check blood sugar 3 times daily.   amLODipine (NORVASC) 10 MG tablet TAKE 1 TABLET (10 MG TOTAL) BY MOUTH DAILY.   aspirin EC 81 MG tablet Take 1 tablet (81 mg total) daily by mouth.   atorvastatin (LIPITOR) 80 MG tablet Take 1 tablet (80 mg total) by  mouth daily.   Blood Glucose Monitoring Suppl (ACCU-CHEK GUIDE) w/Device KIT 1 kit by Does not apply route in the morning, at noon, and at bedtime. Use to check blood sugar 3 times daily.   butalbital-acetaminophen-caffeine (FIORICET) 50-325-40 MG tablet Take 1 tablet by mouth every 6 (six) hours as needed for headache.   carvedilol (COREG) 25 MG tablet TAKE 1 TABLET (25 MG TOTAL) BY MOUTH 2 (TWO) TIMES DAILY WITH A MEAL.   empagliflozin (JARDIANCE) 10 MG TABS tablet TAKE 1 TABLET (10 MG TOTAL) BY MOUTH DAILY. (Patient not taking: Reported on 05/18/2021)   fluconazole (DIFLUCAN) 150 MG tablet Take 1 tablet (150 mg total) by mouth daily.   glucose blood (ACCU-CHEK GUIDE) test strip Use to check blood sugar 3 times daily.   insulin aspart (NOVOLOG) 100 UNIT/ML injection Inject 28 Units into the skin 3 (three) times daily before meals.   insulin glargine (LANTUS SOLOSTAR) 100 UNIT/ML Solostar Pen INJECT 58 UNITS INTO THE SKIN DAILY.   Insulin Pen Needle 32G X 4 MM MISC Use as directed.   Insulin Syringe-Needle U-100 (BD INSULIN SYRINGE) 29G X 1/2" 0.3 ML MISC Use to inject Novolog TID.   isosorbide mononitrate (IMDUR) 60 MG 24 hr tablet TAKE 1 TABLET (60 MG TOTAL) BY MOUTH DAILY.   Na Sulfate-K Sulfate-Mg Sulf 17.5-3.13-1.6 GM/177ML SOLN Suprep (no substitutions)-TAKE AS DIRECTED.   nitroGLYCERIN (NITROSTAT) 0.4 MG SL tablet Place 1 tab under tongue for  chest pain.  May repeat after 5 minutes x 2.  DO NOT TAKE MORE THAN 3 TABS DURING AN EPISODE OF CHEST PAIN (Patient taking differently: Place 1 tab under tongue for chest pain.  May repeat after 5 minutes x 2.  DO NOT TAKE MORE THAN 3 TABS DURING AN EPISODE OF CHEST PAIN)   oxybutynin (DITROPAN-XL) 5 MG 24 hr tablet Take 1 tablet (5 mg total) by mouth at bedtime.   potassium chloride (KLOR-CON) 10 MEQ tablet Take 1 tablet (10 mEq total) by mouth daily.   No current facility-administered medications for this visit. (Other)   REVIEW OF  SYSTEMS:   ALLERGIES No Known Allergies  PAST MEDICAL HISTORY Past Medical History:  Diagnosis Date   Adrenal adenoma, left    Arthritis    Cancer ([place])    CKD (chronic kidney disease), stage II    Coronary artery disease 07-28-2018 [facility]  currently due to no insurance   per cardiac cath 02-06-2015 (positive mild lateral ishcemia on stress test)--- dLAD 80%,  mLAD 40%,  mPDA 80% (small vessel),  ostial D1 70%,  CFx with lumial irregarlities-- medical management   Depression    Diabetic neuropathy ([place])    Diabetic retinopathy ([place])    NPDR OU   Headache    History of Bell's palsy 07/2011   per pt residual facial pain on left side occasionally   History of cancer of vagina 1999   per pt completed radiation and chemo   History of sepsis 12/30/2017   positive blood culter fro E.coli   Hyperlipidemia    Hypertension    Hypertensive retinopathy    OU   Hypokalemia    Insulin dependent type 2 diabetes mellitus, uncontrolled    followed by pcp---  A1c was 11.7 on 06-15-2018 in epic   Myocardial infarction [facility])    10 yrs ago?   Nocturia    Peripheral neuropathy    PMB (postmenopausal bleeding)    Wears dentures    upper   Wears glasses    Past Surgical History:  Procedure Laterality Date   BREAST BIOPSY  2012   benign   CARDIAC CATHETERIZATION N/A 02/06/2015   Procedure: Left Heart Cath and Coronary Angiography;  Surgeon: Larey Dresser, MD;  Location: [facility];  Service: Cardiovascular;  Laterality: N/A;   CATARACT EXTRACTION Bilateral OD: 03/07/19, OS: 02/28/19   Dr. Gershon Crane   CATARACT EXTRACTION, BILATERAL     COLONOSCOPY     normal exam in [place], [place] 10-12 years ago   EYE SURGERY Bilateral OD: 03/07/19, OS: 02/28/19   Cat Sx - Dr. Gershon Crane   HYSTEROSCOPY WITH D & C N/A 08/01/2018   Procedure: DILATATION AND CURETTAGE /HYSTEROSCOPY;  Surgeon: Mora Bellman, MD;  Location: [facility];  Service:  Gynecology;  Laterality: N/A;   ROBOTIC ASSISTED TOTAL HYSTERECTOMY WITH BILATERAL SALPINGO OOPHERECTOMY N/A 11/28/2018   Procedure: XI ROBOTIC ASSISTED TOTAL HYSTERECTOMY WITH BILATERAL SALPINGO OOPHORECTOMY;  Surgeon: Everitt Amber, MD;  Location: [facility];  Service: Gynecology;  Laterality: N/A;   SENTINEL NODE BIOPSY N/A 11/28/2018   Procedure: SENTINEL NODE BIOPSY;  Surgeon: Everitt Amber, MD;  Location: [facility];  Service: Gynecology;  Laterality: N/A;   UMBILICAL HERNIA REPAIR  child    FAMILY HISTORY Family History  Problem Relation Age of Onset   Heart disease Mother    Hypertension Mother    Diabetes Mother    Cancer Father  hodgkins lymphoma   Heart disease Sister        heart attack   Diabetes Sister    Colon cancer Brother 71   Diabetes Maternal Aunt    Diabetes Maternal Uncle    Cancer Other        parent   Diabetes Other        parent   Heart disease Other        parent   Hyperlipidemia Other        parent   Hypertension Other        parent   Arthritis Other        parent   Breast cancer Neg Hx    Colon polyps Neg Hx    Esophageal cancer Neg Hx    Rectal cancer Neg Hx    Stomach cancer Neg Hx     SOCIAL HISTORY Social History   Tobacco Use   Smoking status: Never   Smokeless tobacco: Never  Vaping Use   Vaping Use: Never used  Substance Use Topics   Alcohol use: Not Currently   Drug use: Yes    Types: Marijuana    Comment: every 2 weeks per pt       OPHTHALMIC EXAM:  Not recorded     IMAGING AND PROCEDURES  Imaging and Procedures for 06/15/2021          ASSESSMENT/PLAN:    ICD-10-CM   1. Moderate nonproliferative diabetic retinopathy of both eyes with macular edema associated with type 2 diabetes mellitus ([place])  M38.4665     2. CME (cystoid macular edema), bilateral  H35.353     3. Essential hypertension  I10     4. Hypertensive retinopathy of both eyes  H35.033     5. Pseudophakia of both eyes  Z96.1     6. Bilateral  ocular hypertension  H40.053       1,2. Moderate Non-proliferative diabetic retinopathy, OU OD with combination DME/CME             **delayed to follow up from September 2021 to April 2022--7 mos instead of 4 wks**             - exam shows scattered IRH, edema and tortuous blood vessels OU             - FA (10.23.20) shows leaking MA OU and paracentral petaloid hyperfluorecence OD --> CME/[place]             - OCT shows diabetic macular edema, OU; OD with CME/[place] component contributing as well             - s/p IVA OS #1 (10.23.20), #2 (11.18.20), #3 (12.18.20), #4 (02.24.21), #5 (03.24.21)             - s/p IVA OD #1 (11.06.20), #2 (12.18.20), #3 (02.24.21), #4 (03.24.21), #5 (04.21.21), #6 (5.26.21), #7 (07.26.21), #8 (08.25.21), #9 (09.23.21), #10 (04.26.22)             - s/p IVE OS #1 (04.21.21), #2 (5.26.21), #3 (07.26.21), #4 (08.25.21), #5 (09.23.21), #6 (04.26.22), #7 (05.25.22), #8 (07.01.22), #9 (07.29.22), #10 (08.29.22 - JDM), #11 (09.26.22), #12 (10.24.22)             - s/p IVE OD #1 (05.25.22), #2 (07.01.22), #3 (07.29.22), #4 (08.29.22 - JDM), #5 (09.26.22), #6 (10.24.22), #7 (11.21.22), #8 (12.19.22)             - s/p Vabysmo OS #1 (11.21.22), #2 (12.19.22)             -  BCVA OD 20/20; OS 20/150 improved from 20/200             - OCT today shows OD: persistent IRF/IRHM -- slightly improved; OS: Persistent IRF/IRHM IT -- slightly increased             - recommend IVE OD #9 and IV Vabysmo OS #3 today, 01.19.23             - pt wishes to proceed w/ injections             - RBA of procedure discussed, questions answered             - informed consent obtained             - [place] informed consent form signed and scanned on 12.18.2020             - Eylea OS informed consent form signed and scanned on 4.21.2021             - Eylea OD informed consent form signed and scanned on 05.25.22             - Vabysmo OS consent form signed and scanned on 11.21.22             -  see procedure note             - Eylea4U benefits investigation started, 04.21.21             - cont Pred Forte and Ketorolac QID OU -- decrease PF to BID OU             - f/u in 4 weeks, DFE, OCT, possible injection OU   3,4. Hypertensive retinopathy OU             - discussed importance of tight BP control             - monitor   5. Pseudophakia OU            - s/p CE/IOL OU Gershon Crane)                        OD: 03/07/2019                        OS: 02/28/2019             - IOLs in good position             - CME OU as above             - monitor   6. Ocular Hypertension OU             - IOP: 24 OU (applanation) - ?steroid response             - start cosopt BID OU   Ophthalmic Meds Ordered this visit:  No orders of the defined types were placed in this encounter.    No follow-ups on file.  There are no Patient Instructions on file for this visit.   Explained the diagnoses, plan, and follow up with the patient and they expressed understanding.  Patient expressed understanding of the importance of proper follow up care.   This document serves as a record of services personally performed by Gardiner Sleeper, MD, PhD. It was created on their behalf by Roselee Nova, COMT. The creation of this record is the provider's dictation and/or activities during the visit.  Electronically signed by:  Roselee Nova, COMT 06/12/21 9:25 AM    Gardiner Sleeper, M.D., Ph.D. Diseases & Surgery of the Retina and Vitreous [facility]: M myopia (nearsighted); A astigmatism; H hyperopia (farsighted); P presbyopia; Mrx spectacle prescription;  CTL contact lenses; OD right eye; OS left eye; OU both eyes  XT exotropia; ET esotropia; PEK punctate epithelial keratitis; PEE punctate epithelial erosions; DES dry eye syndrome; MGD meibomian gland dysfunction; ATs artificial tears; PFAT's preservative free artificial tears; [place] nuclear sclerotic cataract; PSC  posterior subcapsular cataract; ERM epi-retinal membrane; PVD posterior vitreous detachment; RD retinal detachment; DM diabetes mellitus; DR diabetic retinopathy; NPDR non-proliferative diabetic retinopathy; PDR proliferative diabetic retinopathy; CSME clinically significant macular edema; DME diabetic macular edema; dbh dot blot hemorrhages; CWS cotton wool spot; POAG primary open angle glaucoma; C/D cup-to-disc ratio; HVF humphrey visual field; GVF goldmann visual field; OCT optical coherence tomography; IOP intraocular pressure; BRVO Branch retinal vein occlusion; CRVO central retinal vein occlusion; CRAO central retinal artery occlusion; BRAO branch retinal artery occlusion; RT retinal tear; SB scleral buckle; PPV pars plana vitrectomy; VH Vitreous hemorrhage; PRP panretinal laser photocoagulation; IVK intravitreal kenalog; VMT vitreomacular traction; MH Macular hole;  NVD neovascularization of the disc; NVE neovascularization elsewhere; AREDS age related eye disease study; ARMD age related macular degeneration; POAG primary open angle glaucoma; EBMD epithelial/anterior basement membrane dystrophy; ACIOL anterior chamber intraocular lens; IOL intraocular lens; PCIOL posterior chamber intraocular lens; Phaco/IOL phacoemulsification with intraocular lens placement; [place] photorefractive keratectomy; LASIK laser assisted in situ keratomileusis; HTN hypertension; DM diabetes mellitus; COPD chronic obstructive pulmonary disease

## 2021-06-15 ENCOUNTER — Encounter (INDEPENDENT_AMBULATORY_CARE_PROVIDER_SITE_OTHER): Payer: Medicare Other | Admitting: Ophthalmology

## 2021-06-15 DIAGNOSIS — I10 Essential (primary) hypertension: Secondary | ICD-10-CM

## 2021-06-15 DIAGNOSIS — H35353 Cystoid macular degeneration, bilateral: Secondary | ICD-10-CM

## 2021-06-15 DIAGNOSIS — H35033 Hypertensive retinopathy, bilateral: Secondary | ICD-10-CM

## 2021-06-15 DIAGNOSIS — E113313 Type 2 diabetes mellitus with moderate nonproliferative diabetic retinopathy with macular edema, bilateral: Secondary | ICD-10-CM

## 2021-06-15 DIAGNOSIS — Z961 Presence of intraocular lens: Secondary | ICD-10-CM

## 2021-06-15 DIAGNOSIS — H40053 Ocular hypertension, bilateral: Secondary | ICD-10-CM

## 2021-06-18 ENCOUNTER — Other Ambulatory Visit: Payer: Self-pay

## 2021-06-22 ENCOUNTER — Encounter (INDEPENDENT_AMBULATORY_CARE_PROVIDER_SITE_OTHER): Payer: Self-pay | Admitting: Ophthalmology

## 2021-06-22 ENCOUNTER — Other Ambulatory Visit: Payer: Self-pay

## 2021-06-22 ENCOUNTER — Ambulatory Visit (INDEPENDENT_AMBULATORY_CARE_PROVIDER_SITE_OTHER): Payer: Medicare Other | Admitting: Ophthalmology

## 2021-06-22 DIAGNOSIS — E113313 Type 2 diabetes mellitus with moderate nonproliferative diabetic retinopathy with macular edema, bilateral: Secondary | ICD-10-CM

## 2021-06-22 DIAGNOSIS — Z961 Presence of intraocular lens: Secondary | ICD-10-CM

## 2021-06-22 DIAGNOSIS — H35033 Hypertensive retinopathy, bilateral: Secondary | ICD-10-CM

## 2021-06-22 DIAGNOSIS — H35353 Cystoid macular degeneration, bilateral: Secondary | ICD-10-CM | POA: Diagnosis not present

## 2021-06-22 DIAGNOSIS — I10 Essential (primary) hypertension: Secondary | ICD-10-CM

## 2021-06-22 DIAGNOSIS — H40053 Ocular hypertension, bilateral: Secondary | ICD-10-CM

## 2021-06-22 MED ORDER — AFLIBERCEPT 2MG/0.05ML IZ SOLN FOR KALEIDOSCOPE
2.0000 mg | INTRAVITREAL | Status: AC | PRN
  Administered 2021-06-22: 2 mg via INTRAVITREAL

## 2021-06-22 MED ORDER — FARICIMAB-SVOA 6 MG/0.05ML IZ SOLN
6.0000 mg | INTRAVITREAL | Status: AC | PRN
  Administered 2021-06-22: 6 mg via INTRAVITREAL

## 2021-06-22 NOTE — Progress Notes (Signed)
Kimberly Frazier  06/22/2021     CHIEF COMPLAINT Patient presents for Retina Follow Up   HISTORY OF PRESENT ILLNESS: Kimberly Frazier is a 61 y.o. female who presents to the clinic today for:   HPI     Retina Follow Up   Patient presents with  Diabetic Retinopathy.  In both eyes.  Severity is moderate.  Duration of 4 weeks.  Since onset it is stable.  I, the attending physician,  performed the HPI with the patient and updated documentation appropriately.        Comments   Patient states vision the same OU. More frequent burning OU--uses AT's to help. On Cosopt bid OU for IOP control. Also taking Pred Forte bid OU and ketorolac qid OU.       Last edited by Kimberly Caffey, MD on 06/22/2021  8:45 AM.      Referring physician: Ladell Pier, MD [place],  [place] [address]  HISTORICAL INFORMATION:   Selected notes from the MEDICAL RECORD NUMBER Referred by Dr. Rutherford Guys for concern of CME    CURRENT MEDICATIONS: Current Outpatient Medications (Ophthalmic Drugs)  Medication Sig   dorzolamide-timolol (COSOPT) 22.3-6.8 MG/ML ophthalmic solution Place 1 drop into both eyes 2 (two) times daily.   ketorolac (ACULAR) 0.5 % ophthalmic solution Place 1 drop into both eyes 4 (four) times daily.   ketorolac (ACULAR) 0.5 % ophthalmic solution Place 1 drop into both eyes 4 (four) times daily.   prednisoLONE acetate (PRED FORTE) 1 % ophthalmic suspension Place 1 drop into both eyes 4 (four) times daily. (Patient taking differently: Place 1 drop into both eyes 2 (two) times daily.)   prednisoLONE acetate (PRED FORTE) 1 % ophthalmic suspension Place 1 drop into both eyes 4 (four) times daily.   Bromfenac Sodium (PROLENSA) 0.07 % SOLN Place 1 drop into both eyes 4 (four) times daily. (Patient not taking: Reported on 05/18/2021)   No current facility-administered medications for this visit. (Ophthalmic Drugs)   Current Outpatient Medications  (Other)  Medication Sig   Accu-Chek Softclix Lancets lancets Use to check blood sugar 3 times daily.   amLODipine (NORVASC) 10 MG tablet TAKE 1 TABLET (10 MG TOTAL) BY MOUTH DAILY.   aspirin EC 81 MG tablet Take 1 tablet (81 mg total) daily by mouth.   atorvastatin (LIPITOR) 80 MG tablet Take 1 tablet (80 mg total) by mouth daily.   Blood Glucose Monitoring Suppl (ACCU-CHEK GUIDE) w/Device KIT 1 kit by Does not apply route in the morning, at noon, and at bedtime. Use to check blood sugar 3 times daily.   butalbital-acetaminophen-caffeine (FIORICET) 50-325-40 MG tablet Take 1 tablet by mouth every 6 (six) hours as needed for headache.   carvedilol (COREG) 25 MG tablet TAKE 1 TABLET (25 MG TOTAL) BY MOUTH 2 (TWO) TIMES DAILY WITH A MEAL.   fluconazole (DIFLUCAN) 150 MG tablet Take 1 tablet (150 mg total) by mouth daily.   glucose blood (ACCU-CHEK GUIDE) test strip Use to check blood sugar 3 times daily.   insulin aspart (NOVOLOG) 100 UNIT/ML injection Inject 28 Units into the skin 3 (three) times daily before meals.   insulin glargine (LANTUS SOLOSTAR) 100 UNIT/ML Solostar Pen INJECT 58 UNITS INTO THE SKIN DAILY.   Insulin Pen Needle 32G X 4 MM MISC Use as directed.   Insulin Syringe-Needle U-100 (BD INSULIN SYRINGE) 29G X 1/2" 0.3 ML MISC Use to inject Novolog TID.   isosorbide mononitrate (  IMDUR) 60 MG 24 hr tablet TAKE 1 TABLET (60 MG TOTAL) BY MOUTH DAILY.   Na Sulfate-K Sulfate-Mg Sulf 17.5-3.13-1.6 GM/177ML SOLN Suprep (no substitutions)-TAKE AS DIRECTED.   nitroGLYCERIN (NITROSTAT) 0.4 MG SL tablet Place 1 tab under tongue for chest pain.  May repeat after 5 minutes x 2.  DO NOT TAKE MORE THAN 3 TABS DURING AN EPISODE OF CHEST PAIN (Patient taking differently: Place 1 tab under tongue for chest pain.  May repeat after 5 minutes x 2.  DO NOT TAKE MORE THAN 3 TABS DURING AN EPISODE OF CHEST PAIN)   oxybutynin (DITROPAN-XL) 5 MG 24 hr tablet Take 1 tablet (5 mg total) by mouth at bedtime.    potassium chloride (KLOR-CON) 10 MEQ tablet Take 1 tablet (10 mEq total) by mouth daily.   empagliflozin (JARDIANCE) 10 MG TABS tablet TAKE 1 TABLET (10 MG TOTAL) BY MOUTH DAILY. (Patient not taking: Reported on 05/18/2021)   No current facility-administered medications for this visit. (Other)   REVIEW OF SYSTEMS: ROS   Positive for: Endocrine, Cardiovascular, Eyes Negative for: Constitutional, Gastrointestinal, Neurological, Skin, Genitourinary, Musculoskeletal, HENT, Respiratory, Psychiatric, Allergic/Imm, Heme/Lymph Last edited by Kimberly Frazier, COT on 06/22/2021  7:52 AM.     ALLERGIES No Known Allergies  PAST MEDICAL HISTORY Past Medical History:  Diagnosis Date   Adrenal adenoma, left    Arthritis    Cancer ([place])    CKD (chronic kidney disease), stage II    Coronary artery disease 07-28-2018 followed [facility])  currently due to no insurance   per cardiac cath 02-06-2015 (positive mild lateral ishcemia on stress test)--- dLAD 80%,  mLAD 40%,  mPDA 80% (small vessel),  ostial D1 70%,  CFx with lumial irregarlities-- medical management   Depression    Diabetic neuropathy ([place])    Diabetic retinopathy ([place])    NPDR OU   Headache    History of Bell's palsy 07/2011   per pt residual facial pain on left side occasionally   History of cancer of vagina 1999   per pt completed radiation and chemo   History of sepsis 12/30/2017   positive blood culter fro E.coli   Hyperlipidemia    Hypertension    Hypertensive retinopathy    OU   Hypokalemia    Insulin dependent type 2 diabetes mellitus, uncontrolled    followed by pcp---  A1c was 11.7 on 06-15-2018 in epic   Myocardial infarction [facility])    10 yrs ago?   Nocturia    Peripheral neuropathy    PMB (postmenopausal bleeding)    Wears dentures    upper   Wears glasses    Past Surgical History:  Procedure Laterality Date   BREAST BIOPSY  2012   benign   CARDIAC CATHETERIZATION N/A 02/06/2015    Procedure: Left Heart Cath and Coronary Angiography;  Surgeon: Larey Dresser, MD;  Location: [facility];  Service: Cardiovascular;  Laterality: N/A;   CATARACT EXTRACTION Bilateral OD: 03/07/19, OS: 02/28/19   Dr. Gershon Crane   CATARACT EXTRACTION, BILATERAL     COLONOSCOPY     normal exam in [place], [place] 10-12 years ago   EYE SURGERY Bilateral OD: 03/07/19, OS: 02/28/19   Cat Sx - Dr. Gershon Crane   HYSTEROSCOPY WITH D & C N/A 08/01/2018   Procedure: DILATATION AND CURETTAGE /HYSTEROSCOPY;  Surgeon: Mora Bellman, MD;  Location: [place];  Service: Gynecology;  Laterality: N/A;   ROBOTIC ASSISTED TOTAL HYSTERECTOMY WITH BILATERAL SALPINGO  OOPHERECTOMY N/A 11/28/2018   Procedure: XI ROBOTIC ASSISTED TOTAL HYSTERECTOMY WITH BILATERAL SALPINGO OOPHORECTOMY;  Surgeon: Everitt Amber, MD;  Location: [facility];  Service: Gynecology;  Laterality: N/A;   SENTINEL NODE BIOPSY N/A 11/28/2018   Procedure: SENTINEL NODE BIOPSY;  Surgeon: Everitt Amber, MD;  Location: [facility];  Service: Gynecology;  Laterality: N/A;   UMBILICAL HERNIA REPAIR  child   FAMILY HISTORY Family History  Problem Relation Age of Onset   Heart disease Mother    Hypertension Mother    Diabetes Mother    Cancer Father        hodgkins lymphoma   Heart disease Sister        heart attack   Diabetes Sister    Colon cancer Brother 62   Diabetes Maternal Aunt    Diabetes Maternal Uncle    Cancer Other        parent   Diabetes Other        parent   Heart disease Other        parent   Hyperlipidemia Other        parent   Hypertension Other        parent   Arthritis Other        parent   Breast cancer Neg Hx    Colon polyps Neg Hx    Esophageal cancer Neg Hx    Rectal cancer Neg Hx    Stomach cancer Neg Hx    SOCIAL HISTORY Social History   Tobacco Use   Smoking status: Never   Smokeless tobacco: Never  Vaping Use   Vaping Use: Never used  Substance Use Topics   Alcohol use: Not Currently    Drug use: Yes    Types: Marijuana    Comment: every 2 weeks per pt       OPHTHALMIC EXAM:  Base Eye Exam     Visual Acuity (Snellen - Linear)       Right Left   Dist [place] 20/20 20/200 +2   Dist ph [place]  NI         Tonometry (Tonopen, 8:00 AM)       Right Left   Pressure 18 21         Pupils       Dark Light Shape React APD   Right 3 2 Round Brisk None   Left 3 2 Round Sluggish Trace         Visual Fields (Counting fingers)       Left Right    Full Full         Extraocular Movement       Right Left    Full, Ortho Full, Ortho         Neuro/Psych     Oriented x3: Yes   Mood/Affect: Normal         Dilation     Both eyes: 1.0% Mydriacyl, 2.5% Phenylephrine @ 8:00 AM           Slit Lamp and Fundus Exam     Slit Lamp Exam       Right Left   Lids/Lashes Dermatochalasis - upper lid Dermatochalasis - upper lid   Conjunctiva/Sclera Mild Melanosis Mild Melanosis   Cornea 1+ Punctate epithelial erosions 2+ central punctate epithelial erosions, decreased TBUT, mild tear film debris, well healed temporal cataract wounds   Anterior Chamber Deep, 0.5+cell/pigment Deep, 0.5+cell/pigment   Iris Round and dilated, No NVI Round and dilated, No NVI  Lens PC IOL in good position with trace PCO PC IOL in good position with 1+PCO   Anterior Vitreous Vitreous syneresis Vitreous syneresis, Posterior vitreous detachment, vitreous condensations         Fundus Exam       Right Left   Disc Pallor, Sharp rim Compact, Pink and Sharp, temporal PPA   C/D Ratio 0.1 0.1   Macula Good foveal reflex, persistent edema inferior macula, scattered Microaneurysms Blunted foveal reflex, central edema - persistent, +exudates/MA/IRH -- greatest temporal macula   Vessels attenuated, Tortuous attenuated, Tortuous   Periphery Attached, scattered drusen, scattered DBH Attached, scattered DBH greatest posteriorly, mild peripheral drusen, prominent DBH inferior disc            IMAGING AND PROCEDURES  Imaging and Procedures for 06/22/2021  OCT, Retina - OU - Both Eyes       Right Eye Quality was good. Central Foveal Thickness: 278. Progression has improved. Findings include abnormal foveal contour, intraretinal fluid, no SRF, intraretinal hyper-reflective material, vitreomacular adhesion (Mild interval improvement in IRF).   Left Eye Quality was good. Central Foveal Thickness: 488. Progression has improved. Findings include abnormal foveal contour, intraretinal fluid, intraretinal hyper-reflective material, vitreomacular adhesion , outer retinal atrophy, subretinal hyper-reflective material, no SRF (Mild interval improvement in IRF).   Notes *Images captured and stored on drive  Diagnosis / Impression:  Macular edema OU - likely combination of CME + DME OU OD: Mild interval improvement in IRF OS: Mild interval improvement in IRF  Clinical management:  See below  Abbreviations: NFP - Normal foveal profile. CME - cystoid macular edema. PED - pigment epithelial detachment. IRF - intraretinal fluid. SRF - subretinal fluid. EZ - ellipsoid zone. ERM - epiretinal membrane. ORA - outer retinal atrophy. ORT - outer retinal tubulation. SRHM - subretinal hyper-reflective material      Intravitreal Injection, Pharmacologic Agent - OD - Right Eye       Time Out 06/22/2021. 8:15 AM. Confirmed correct patient, procedure, site, and patient consented.   Anesthesia Topical anesthesia was used. Anesthetic medications included Lidocaine 2%, Proparacaine 0.5%.   Procedure Preparation included 5% betadine to ocular surface, eyelid speculum. A (32g) needle was used.   Injection: 2 mg aflibercept 2 MG/0.05ML   Route: Intravitreal, Site: Right Eye   NDC: A3590391, Lot: [id number], Expiration date: 04/29/2022, Waste: 0.05 mL   Post-op Post injection exam found visual acuity of at least counting fingers. The patient tolerated the procedure well. There were no  complications. The patient received written and verbal post procedure care education. Post injection medications were not given.      Intravitreal Injection, Pharmacologic Agent - OS - Left Eye       Time Out 06/22/2021. 8:16 AM. Confirmed correct patient, procedure, site, and patient consented.   Anesthesia Topical anesthesia was used. Anesthetic medications included Lidocaine 2%, Proparacaine 0.5%.   Procedure Preparation included 5% betadine to ocular surface, eyelid speculum. A (32g) needle was used.   Injection: 6 mg faricimab-svoa 6 MG/0.05ML   Route: Intravitreal, Site: Left Eye   NDC: S6832610, Lot: [id number], Expiration date: 12/29/2022, Waste: 0 mL   Post-op Post injection exam found visual acuity of at least counting fingers. The patient tolerated the procedure well. There were no complications. The patient received written and verbal post procedure care education. Post injection medications were not given.            ASSESSMENT/PLAN:    ICD-10-CM   1. Moderate nonproliferative diabetic retinopathy of both  eyes with macular edema associated with type 2 diabetes mellitus (HCC)  E11.3313 OCT, Retina - OU - Both Eyes    Intravitreal Injection, Pharmacologic Agent - OD - Right Eye    Intravitreal Injection, Pharmacologic Agent - OS - Left Eye    aflibercept (EYLEA) SOLN 2 mg    2. CME (cystoid macular edema), bilateral  H35.353     3. Essential hypertension  I10     4. Hypertensive retinopathy of both eyes  H35.033     5. Pseudophakia of both eyes  Z96.1     6. Bilateral ocular hypertension  H40.053      1,2. Moderate Non-proliferative diabetic retinopathy, OU OD with combination DME/CME             **delayed to follow up from September 2021 to April 2022--7 mos instead of 4 wks**             - exam shows scattered IRH, edema and tortuous blood vessels OU             - FA (10.23.20) shows leaking MA OU and paracentral petaloid hyperfluorecence OD -->  CME/[place]             - OCT shows diabetic macular edema, OU; OD with CME/[place] component contributing as well             - s/p IVA OS #1 (10.23.20), #2 (11.18.20), #3 (12.18.20), #4 (02.24.21), #5 (03.24.21)             - s/p IVA OD #1 (11.06.20), #2 (12.18.20), #3 (02.24.21), #4 (03.24.21), #5 (04.21.21), #6 (5.26.21), #7 (07.26.21), #8 (08.25.21), #9 (09.23.21), #10 (04.26.22)             - s/p IVE OS #1 (04.21.21), #2 (5.26.21), #3 (07.26.21), #4 (08.25.21), #5 (09.23.21), #6 (04.26.22), #7 (05.25.22), #8 (07.01.22), #9 (07.29.22), #10 (08.29.22 - JDM), #11 (09.26.22), #12 (10.24.22)             - s/p IVE OD #1 (05.25.22), #2 (07.01.22), #3 (07.29.22), #4 (08.29.22 - JDM), #5 (09.26.22), #6 (10.24.22), #7 (11.21.22), #8 (12.19.22)             - s/p Vabysmo OS #1 (11.21.22, sample), #2 (12.19.22)             - BCVA OD 20/20; OS 20/200 - improved             - OCT today: Mild interval improvement in IRF OU             - recommend IVE OD #9 and IV Vabysmo OS #3 today, 01.23.23             - pt wishes to proceed w/ injections             - RBA of procedure discussed, questions answered             - informed consent obtained             - [place] informed consent form signed and scanned on 12.18.2020             - Eylea OS informed consent form signed and scanned on 4.21.2021             - Eylea OD informed consent form signed and scanned on 05.25.22             - Vabysmo OS consent form signed and scanned on 11.21.22             -  see procedure Frazier             - Eylea4U benefits investigation started, 04.21.21             - cont Pred Forte BID and Ketorolac QID OU              - f/u in 4 weeks, DFE, OCT, possible injection OU   3,4. Hypertensive retinopathy OU             - discussed importance of tight BP control             - monitor   5. Pseudophakia OU            - s/p CE/IOL OU Gershon Crane)                        OD: 03/07/2019                        OS: 02/28/2019              - IOLs in good position             - CME OU as above             - monitor   6. Ocular Hypertension OU             - IOP: 18, 21 - ?steroid response             - cont cosopt bid oU   Ophthalmic Meds Ordered this visit:  Meds ordered this encounter  Medications   aflibercept (EYLEA) SOLN 2 mg     Return in about 4 weeks (around 07/20/2021) for f/u NPDR OU, DFE, OCT.  There are no Patient Instructions on file for this visit.   Explained the diagnoses, plan, and follow up with the patient and they expressed understanding.  Patient expressed understanding of the importance of proper follow up care.   This document serves as a record of services personally performed by Gardiner Sleeper, MD, PhD. It was created on their behalf by Roselee Nova, COMT. The creation of this record is the provider's dictation and/or activities during the visit.  Electronically signed by: Roselee Nova, COMT 06/22/21 8:47 AM  This document serves as a record of services personally performed by Gardiner Sleeper, MD, PhD. It was created on their behalf by San Jetty. Owens Shark, OA an ophthalmic technician. The creation of this record is the provider's dictation and/or activities during the visit.    Electronically signed by: San Jetty. Owens Shark, [place] 01.23.2023 8:47 AM  Gardiner Sleeper, M.D., Ph.D. Diseases & Surgery of the Retina and Vitreous [facility]  I have reviewed the above documentation for accuracy and completeness, and I agree with the above. Gardiner Sleeper, M.D., Ph.D. 06/22/21 8:50 AM   Abbreviations: M myopia (nearsighted); A astigmatism; H hyperopia (farsighted); P presbyopia; Mrx spectacle prescription;  CTL contact lenses; OD right eye; OS left eye; OU both eyes  XT exotropia; ET esotropia; PEK punctate epithelial keratitis; PEE punctate epithelial erosions; DES dry eye syndrome; MGD meibomian gland dysfunction; ATs artificial tears; PFAT's preservative free artificial tears;  [place] nuclear sclerotic cataract; PSC posterior subcapsular cataract; ERM epi-retinal membrane; PVD posterior vitreous detachment; RD retinal detachment; DM diabetes mellitus; DR diabetic retinopathy; NPDR non-proliferative diabetic retinopathy; PDR proliferative diabetic retinopathy; CSME clinically significant macular edema; DME diabetic macular edema; dbh dot  blot hemorrhages; CWS cotton wool spot; POAG primary open angle glaucoma; C/D cup-to-disc ratio; HVF humphrey visual field; GVF goldmann visual field; OCT optical coherence tomography; IOP intraocular pressure; BRVO Branch retinal vein occlusion; CRVO central retinal vein occlusion; CRAO central retinal artery occlusion; BRAO branch retinal artery occlusion; RT retinal tear; SB scleral buckle; PPV pars plana vitrectomy; VH Vitreous hemorrhage; PRP panretinal laser photocoagulation; IVK intravitreal kenalog; VMT vitreomacular traction; MH Macular hole;  NVD neovascularization of the disc; NVE neovascularization elsewhere; AREDS age related eye disease study; ARMD age related macular degeneration; POAG primary open angle glaucoma; EBMD epithelial/anterior basement membrane dystrophy; ACIOL anterior chamber intraocular lens; IOL intraocular lens; PCIOL posterior chamber intraocular lens; Phaco/IOL phacoemulsification with intraocular lens placement; [place] photorefractive keratectomy; LASIK laser assisted in situ keratomileusis; HTN hypertension; DM diabetes mellitus; COPD chronic obstructive pulmonary disease

## 2021-06-28 ENCOUNTER — Other Ambulatory Visit: Payer: Self-pay | Admitting: Internal Medicine

## 2021-06-28 DIAGNOSIS — I10 Essential (primary) hypertension: Secondary | ICD-10-CM

## 2021-06-29 ENCOUNTER — Other Ambulatory Visit: Payer: Self-pay

## 2021-06-29 NOTE — Telephone Encounter (Signed)
Requested medication (s) are due for refill today:   Yes  Requested medication (s) are on the active medication list:   Yes  Future visit scheduled:   Yes 06/30/2021   Last ordered: 07/29/2020 #90, 1 refill  Returned because no refills remain on this rx.   Requested Prescriptions  Pending Prescriptions Disp Refills   amLODipine (NORVASC) 10 MG tablet 90 tablet 1    Sig: TAKE 1 TABLET (10 MG TOTAL) BY MOUTH DAILY.     Cardiovascular:  Calcium Channel Blockers Passed - 06/28/2021  1:15 PM      Passed - Last BP in normal range    BP Readings from Last 1 Encounters:  04/20/21 132/78          Passed - Valid encounter within last 6 months    Recent Outpatient Visits           1 month ago Subacute vaginitis   [place], MD   5 months ago Type 2 diabetes mellitus with morbid obesity [facility])   [place], MD   6 months ago [place], MD   8 months ago Essential hypertension   [place], RPH-CPP   9 months ago Encounter for [employer] annual wellness exam   [place], MD       Future Appointments             Tomorrow Ladell Pier, MD [place]   In 3 weeks Ladell Pier, MD [place]

## 2021-06-30 ENCOUNTER — Other Ambulatory Visit: Payer: Self-pay

## 2021-06-30 ENCOUNTER — Ambulatory Visit: Payer: Medicare Other | Attending: Internal Medicine | Admitting: Internal Medicine

## 2021-06-30 ENCOUNTER — Encounter: Payer: Self-pay | Admitting: Internal Medicine

## 2021-06-30 DIAGNOSIS — I10 Essential (primary) hypertension: Secondary | ICD-10-CM | POA: Diagnosis not present

## 2021-06-30 DIAGNOSIS — I2510 Atherosclerotic heart disease of native coronary artery without angina pectoris: Secondary | ICD-10-CM | POA: Diagnosis not present

## 2021-06-30 DIAGNOSIS — E1169 Type 2 diabetes mellitus with other specified complication: Secondary | ICD-10-CM

## 2021-06-30 LAB — POCT GLYCOSYLATED HEMOGLOBIN (HGB A1C): HbA1c, POC (controlled diabetic range): 10.1 % — AB (ref 0.0–7.0)

## 2021-06-30 LAB — GLUCOSE, POCT (MANUAL RESULT ENTRY): POC Glucose: 116 mg/dl — AB (ref 70–99)

## 2021-06-30 MED ORDER — TRULICITY 0.75 MG/0.5ML ~~LOC~~ SOAJ
0.7500 mg | SUBCUTANEOUS | 3 refills | Status: DC
  Filled 2021-06-30 – 2021-07-01 (×2): qty 2, 28d supply, fill #0
  Filled 2021-07-27: qty 2, 28d supply, fill #1
  Filled 2021-08-26: qty 2, 28d supply, fill #2
  Filled 2021-09-28 – 2021-10-01 (×2): qty 2, 28d supply, fill #3

## 2021-06-30 MED ORDER — AMLODIPINE BESYLATE 10 MG PO TABS
ORAL_TABLET | Freq: Every day | ORAL | 1 refills | Status: DC
  Filled 2021-06-30: qty 90, 90d supply, fill #0
  Filled 2021-11-04: qty 90, 90d supply, fill #1

## 2021-06-30 MED ORDER — NITROGLYCERIN 0.4 MG SL SUBL
SUBLINGUAL_TABLET | SUBLINGUAL | 6 refills | Status: DC
  Filled 2021-06-30: qty 25, 5d supply, fill #0

## 2021-06-30 NOTE — Patient Instructions (Signed)
Start Trulicity once a week. Check blood sugars before breakfast and dinner and bring reading in 2 weeks for clinical pharmacist.  .

## 2021-06-30 NOTE — Progress Notes (Signed)
Patient ID: Kimberly Frazier, female    DOB: 08-14-1960  MRN: [id number]  CC: Diabetes and Hypertension   Subjective: Kimberly Frazier is a 61 y.o. female who presents for Her concerns today include:  Patient with history of DM with neuropathy and retinopathy, HTN with hypertensive heart disease, CAD (Coronary CT revealed occlusion in the mid to distal LAD, distal RCA.  Cardiology states that she will need CABG eventually), HL, obesity, endometrial CA s/p hysterectomy   HYPERTENSION/CAD Taking Imdur, Atorvatasin and Coreg Out Norvasc x 2 days.  Limits salt Endorses DOE.  No CP or recent use of sublingual nitro. Some pedal edema  DM/obesity Results for orders placed or performed in visit on 06/30/21  POCT glucose (manual entry)  Result Value Ref Range   POC Glucose 116 (A) 70 - 99 mg/dl  POCT glycosylated hemoglobin (Hb A1C)  Result Value Ref Range   Hemoglobin A1C     HbA1c POC (<> result, manual entry)     HbA1c, POC (prediabetic range)     HbA1c, POC (controlled diabetic range) 10.1 (A) 0.0 - 7.0 %   A1C increase  from 8.8. Wgh up 15 lbs.  She admits that she does not snack on the healthiest things.  However she feels her portion sizes are not too large. On Lantus 58-60 units daily.  Humalog 25-30 units.  We stop Jardiance due to recurrent yeast.  She was on Trulicity about 6-3/8 years ago but we had to change it to Victoza after a while due to her insurance.  Patient did not like taking the Victoza every day so she discontinued taking it.  At that time we had changed to [place]. Checks BS once a day after lunch. 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   History of radiation therapy 11/28/2018   Diabetes mellitus ([place]) 10/06/2018   Type 2 diabetes mellitus with hyperglycemia, with long-term current use of insulin ([place]) 10/06/2018   Adrenal adenoma, left 06/15/2018   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   Postmenopausal vaginal bleeding 06/15/2018   Hyperkalemia    Diabetic peripheral neuropathy ([place]) 09/03/2016   CAD (coronary artery disease) 04/10/2015   Severe obesity (BMI >= 40) ([place]) 09/20/2013   Hyperhydrosis disorder 09/20/2013   Hyperlipidemia 10/25/2012   Diabetes mellitus type 2, uncontrolled, with complications 46/65/9935   Essential hypertension 07/26/2012     Current Outpatient Medications on File Prior to Visit  Medication Sig Dispense Refill   Accu-Chek Softclix Lancets lancets Use to check blood sugar 3 times daily. 100 each 6   aspirin EC 81 MG tablet Take 1 tablet (81 mg total) daily by mouth. 100 tablet 1   atorvastatin (LIPITOR) 80 MG tablet Take 1 tablet (80 mg total) by mouth daily. 30 tablet 3   Blood Glucose Monitoring Suppl (ACCU-CHEK GUIDE) w/Device KIT 1 kit by Does not apply route in the morning, at noon, and at bedtime. Use to check blood sugar 3 times daily. 1 kit 0   Bromfenac Sodium (PROLENSA) 0.07 % SOLN Place 1 drop into both eyes 4 (four) times  daily. (Patient not taking: Reported on 05/18/2021) 6 mL 0   butalbital-acetaminophen-caffeine (FIORICET) 50-325-40 MG tablet Take 1 tablet by mouth every 6 (six) hours as needed for headache. 12 tablet 3   carvedilol (COREG) 25 MG tablet TAKE 1 TABLET (25 MG TOTAL) BY MOUTH 2 (TWO) TIMES DAILY WITH A MEAL. 60 tablet 5   dorzolamide-timolol (COSOPT) 22.3-6.8 MG/ML ophthalmic solution Place 1 drop into both eyes 2 (two) times daily. 10 mL 1   fluconazole (DIFLUCAN) 150 MG tablet Take 1 tablet (150 mg total) by mouth daily. 2 tablet 0    glucose blood (ACCU-CHEK GUIDE) test strip Use to check blood sugar 3 times daily. 100 each 6   insulin aspart (NOVOLOG) 100 UNIT/ML injection Inject 28 Units into the skin 3 (three) times daily before meals. 140 mL 0   insulin glargine (LANTUS SOLOSTAR) 100 UNIT/ML Solostar Pen INJECT 58 UNITS INTO THE SKIN DAILY. 15 mL 0   Insulin Pen Needle 32G X 4 MM MISC Use as directed. 100 each 0   Insulin Syringe-Needle U-100 (BD INSULIN SYRINGE) 29G X 1/2" 0.3 ML MISC Use to inject Novolog TID. 100 each 2   isosorbide mononitrate (IMDUR) 60 MG 24 hr tablet TAKE 1 TABLET (60 MG TOTAL) BY MOUTH DAILY. 90 tablet 3   ketorolac (ACULAR) 0.5 % ophthalmic solution Place 1 drop into both eyes 4 (four) times daily. 5 mL 0   ketorolac (ACULAR) 0.5 % ophthalmic solution Place 1 drop into both eyes 4 (four) times daily. 10 mL 4   Na Sulfate-K Sulfate-Mg Sulf 17.5-3.13-1.6 GM/177ML SOLN Suprep (no substitutions)-TAKE AS DIRECTED. 354 mL 0   oxybutynin (DITROPAN-XL) 5 MG 24 hr tablet Take 1 tablet (5 mg total) by mouth at bedtime. 30 tablet 5   potassium chloride (KLOR-CON) 10 MEQ tablet Take 1 tablet (10 mEq total) by mouth daily. 90 tablet 1   prednisoLONE acetate (PRED FORTE) 1 % ophthalmic suspension Place 1 drop into both eyes 4 (four) times daily. (Patient taking differently: Place 1 drop into both eyes 2 (two) times daily.) 15 mL 4   prednisoLONE acetate (PRED FORTE) 1 % ophthalmic suspension Place 1 drop into both eyes 4 (four) times daily. 10 mL 0   No current facility-administered medications on file prior to visit.    Allergies  Allergen Reactions   Jardiance [Empagliflozin] Other (See Comments)    Frequent yeast infection    Social History   Socioeconomic History   Marital status: Single    Spouse name: Not on file   Number of children: 2   Years of education: 12   Highest education level: Not on file  Occupational History   Occupation: retired  Tobacco Use   Smoking status: Never    Smokeless tobacco: Never  Vaping Use   Vaping Use: Never used  Substance and Sexual Activity   Alcohol use: Not Currently   Drug use: Yes    Types: Marijuana    Comment: every 2 weeks per pt   Sexual activity: Yes  Other Topics Concern   Not on file  Social History Narrative   Lives at home with her grandson.   Right-handed.   No daily caffeine use.   Social Determinants of Health   Financial Resource Strain: Not on file  Food Insecurity: Not on file  Transportation Needs: Not on file  Physical Activity: Not on file  Stress: Not on file  Social Connections: Not on file  Intimate Partner Violence: Not on file  Family History  Problem Relation Age of Onset   Heart disease Mother    Hypertension Mother    Diabetes Mother    Cancer Father        hodgkins lymphoma   Heart disease Sister        heart attack   Diabetes Sister    Colon cancer Brother 24   Diabetes Maternal Aunt    Diabetes Maternal Uncle    Cancer Other        parent   Diabetes Other        parent   Heart disease Other        parent   Hyperlipidemia Other        parent   Hypertension Other        parent   Arthritis Other        parent   Breast cancer Neg Hx    Colon polyps Neg Hx    Esophageal cancer Neg Hx    Rectal cancer Neg Hx    Stomach cancer Neg Hx     Past Surgical History:  Procedure Laterality Date   BREAST BIOPSY  2012   benign   CARDIAC CATHETERIZATION N/A 02/06/2015   Procedure: Left Heart Cath and Coronary Angiography;  Surgeon: Larey Dresser, MD;  Location: [facility];  Service: Cardiovascular;  Laterality: N/A;   CATARACT EXTRACTION Bilateral OD: 03/07/19, OS: 02/28/19   Dr. Gershon Crane   CATARACT EXTRACTION, BILATERAL     COLONOSCOPY     normal exam in [place], [place] 10-12 years ago   EYE SURGERY Bilateral OD: 03/07/19, OS: 02/28/19   Cat Sx - Dr. Gershon Crane   HYSTEROSCOPY WITH D & C N/A 08/01/2018   Procedure: DILATATION AND CURETTAGE /HYSTEROSCOPY;  Surgeon:  Mora Bellman, MD;  Location: [facility];  Service: Gynecology;  Laterality: N/A;   ROBOTIC ASSISTED TOTAL HYSTERECTOMY WITH BILATERAL SALPINGO OOPHERECTOMY N/A 11/28/2018   Procedure: XI ROBOTIC ASSISTED TOTAL HYSTERECTOMY WITH BILATERAL SALPINGO OOPHORECTOMY;  Surgeon: Everitt Amber, MD;  Location: [facility];  Service: Gynecology;  Laterality: N/A;   SENTINEL NODE BIOPSY N/A 11/28/2018   Procedure: SENTINEL NODE BIOPSY;  Surgeon: Everitt Amber, MD;  Location: [facility];  Service: Gynecology;  Laterality: N/A;   UMBILICAL HERNIA REPAIR  child    ROS: Review of Systems Negative except as stated above  PHYSICAL EXAM: BP 135/84    Pulse 75    Resp 16    Wt 296 lb (134.3 kg)    SpO2 98%    BMI 50.02 kg/m   Wt Readings from Last 3 Encounters:  06/30/21 296 lb (134.3 kg)  04/20/21 288 lb (130.6 kg)  01/30/21 281 lb (127.5 kg)    Physical Exam  General appearance - alert, well appearing, and in no distress Mental status - normal mood, behavior, speech, dress, motor activity, and thought processes Neck - supple, no significant adenopathy Chest - clear to auscultation, no wheezes, rales or rhonchi, symmetric air entry Heart - normal rate, regular rhythm, normal S1, S2, no murmurs, rubs, clicks or gallops Extremities - peripheral pulses normal, no pedal edema, no clubbing or cyanosis   CMP Latest Ref Rng & Units 01/30/2021 01/20/2021 03/24/2020  Glucose 65 - 99 mg/dL 117(H) - 348(H)  BUN 8 - 27 mg/dL 19 - 15  Creatinine 0.57 - 1.00 mg/dL 1.14(H) - 1.23(H)  Sodium 134 - 144 mmol/L 142 - 137  Potassium 3.5 - 5.2 mmol/L 3.7 - 3.0(L)  Chloride 96 -  106 mmol/L 104 - 100  CO2 20 - 29 mmol/L 25 - 28  Calcium 8.7 - 10.3 mg/dL 9.2 - 8.8(L)  Total Protein 6.0 - 8.5 g/dL - 7.1 -  Total Bilirubin 0.0 - 1.2 mg/dL - 0.7 -  Alkaline Phos 44 - 121 IU/L - 127(H) -  AST 0 - 40 IU/L - 15 -  ALT 0 - 32 IU/L - 17 -   Lipid Panel     Component Value Date/Time   CHOL 137 01/20/2021 1020   TRIG  136 01/20/2021 1020   HDL 41 01/20/2021 1020   CHOLHDL 3.3 01/20/2021 1020   CHOLHDL 5 07/15/2015 1513   VLDL 44.0 (H) 07/15/2015 1513   LDLCALC 72 01/20/2021 1020   LDLDIRECT 93.0 07/15/2015 1513    CBC    Component Value Date/Time   WBC 10.9 (H) 03/24/2020 1427   RBC 4.50 03/24/2020 1427   HGB 12.7 03/24/2020 1427   HGB 13.4 06/15/2018 1056   HCT 39.4 03/24/2020 1427   HCT 40.3 06/15/2018 1056   PLT 247 03/24/2020 1427   PLT 229 06/15/2018 1056   MCV 87.6 03/24/2020 1427   MCV 88 06/15/2018 1056   MCH 28.2 03/24/2020 1427   MCHC 32.2 03/24/2020 1427   RDW 14.2 03/24/2020 1427   RDW 13.6 06/15/2018 1056   LYMPHSABS 1.0 12/30/2017 0812   MONOABS 1.3 (H) 12/30/2017 0812   EOSABS 0.1 12/30/2017 0812   BASOSABS 0.0 12/30/2017 0812    ASSESSMENT AND PLAN:  1. Type 2 diabetes mellitus with morbid obesity ([place]) Discussed and encourage healthy eating habits. Encouraged her to check blood sugars twice a day before meals and record the readings.  I will have her follow-up with the clinical pharmacist in a few weeks with her readings. Continue current dose of Lantus and NovoLog insulin. We discussed trying to see whether her insurance will approve for the Trulicity.  She is willing to get back on it if her insurance will cover.  I told her it would have the added benefit of helping with some weight loss.  Went over possible side effects including nausea and abdominal pain.  If she has any vomiting or abdominal pain on the medication she should stop and let me know. Encouraged her to move more. - POCT glucose (manual entry) - POCT glycosylated hemoglobin (Hb A1C) - Microalbumin / creatinine urine ratio - Dulaglutide (TRULICITY) 9.62 IW/9.7LG SOPN; Inject 0.75 mg into the skin once a week.  Dispense: 2 mL; Refill: 3  2. Essential hypertension Not at goal.  However she has been out of the amlodipine.  Refill sent on that.  She will continue her other medications including the  carvedilol and isosorbide - amLODipine (NORVASC) 10 MG tablet; TAKE 1 TABLET (10 MG TOTAL) BY MOUTH DAILY.  Dispense: 90 tablet; Refill: 1  3. Coronary artery disease involving native coronary artery of native heart without angina pectoris Stable.  Continue carvedilol, atorvastatin and aspirin - nitroGLYCERIN (NITROSTAT) 0.4 MG SL tablet; Place 1 tab under tongue for chest pain.  May repeat after 5 minutes x 2.  DO NOT TAKE MORE THAN 3 TABS DURING AN EPISODE OF CHEST PAIN  Dispense: 25 tablet; Refill: 6    Patient was given the opportunity to ask questions.  Patient verbalized understanding of the plan and was able to repeat key elements of the plan.   Orders Placed This Encounter  Procedures   Microalbumin / creatinine urine ratio   POCT glucose (manual entry)  POCT glycosylated hemoglobin (Hb A1C)     Requested Prescriptions   Signed Prescriptions Disp Refills   Dulaglutide (TRULICITY) 0.99 YT/8.0QS SOPN 2 mL 3    Sig: Inject 0.75 mg into the skin once a week.   amLODipine (NORVASC) 10 MG tablet 90 tablet 1    Sig: TAKE 1 TABLET (10 MG TOTAL) BY MOUTH DAILY.   nitroGLYCERIN (NITROSTAT) 0.4 MG SL tablet 25 tablet 6    Sig: Place 1 tab under tongue for chest pain.  May repeat after 5 minutes x 2.  DO NOT TAKE MORE THAN 3 TABS DURING AN EPISODE OF CHEST PAIN    Return in about 3 months (around 09/27/2021) for Appt with [facility] in 2 wks for BS check.  Karle Plumber, MD, FACP

## 2021-07-01 ENCOUNTER — Other Ambulatory Visit: Payer: Self-pay

## 2021-07-01 ENCOUNTER — Telehealth: Payer: Self-pay | Admitting: Pharmacist

## 2021-07-01 LAB — MICROALBUMIN / CREATININE URINE RATIO
Creatinine, Urine: 99.8 mg/dL
Microalb/Creat Ratio: 82 mg/g creat — ABNORMAL HIGH (ref 0–29)
Microalbumin, Urine: 81.5 ug/mL

## 2021-07-01 NOTE — Telephone Encounter (Signed)
Claiborne Billings have you gotten anything about a PA for this patient's Trulicity. I was consulted by Dr. Wynetta Emery.   We had her on Trulicity in the past but insurance stopped paying for it. They preferred Victoza but the patient is already on multiple daily doses of insulin. We are running out of oral options so we wanted to see if we could submit a PA requesting approval for Trulicity. Is this an option?

## 2021-07-02 ENCOUNTER — Other Ambulatory Visit: Payer: Self-pay

## 2021-07-06 ENCOUNTER — Telehealth: Payer: Self-pay

## 2021-07-06 NOTE — Telephone Encounter (Signed)
Contacted pt to go over lab results pt is aware and doesn't have any questions or concerns 

## 2021-07-10 ENCOUNTER — Other Ambulatory Visit: Payer: Self-pay

## 2021-07-10 ENCOUNTER — Other Ambulatory Visit: Payer: Self-pay | Admitting: Internal Medicine

## 2021-07-10 DIAGNOSIS — Z794 Long term (current) use of insulin: Secondary | ICD-10-CM

## 2021-07-10 MED ORDER — LANTUS SOLOSTAR 100 UNIT/ML ~~LOC~~ SOPN
PEN_INJECTOR | SUBCUTANEOUS | 1 refills | Status: DC
  Filled 2021-07-10: qty 15, 25d supply, fill #0
  Filled 2021-08-11: qty 15, 25d supply, fill #1

## 2021-07-10 NOTE — Telephone Encounter (Signed)
Requested Prescriptions  Pending Prescriptions Disp Refills   insulin glargine (LANTUS SOLOSTAR) 100 UNIT/ML Solostar Pen 15 mL 1    Sig: INJECT 58 UNITS INTO THE SKIN DAILY.     Endocrinology:  Diabetes - Insulins Failed - 07/10/2021 10:01 AM      Failed - HBA1C is between 0 and 7.9 and within 180 days    HbA1c, POC (controlled diabetic range)  Date Value Ref Range Status  06/30/2021 10.1 (A) 0.0 - 7.0 % Final         Passed - Valid encounter within last 6 months    Recent Outpatient Visits          1 week ago Type 2 diabetes mellitus with morbid obesity ([place])   [place] Ladell Pier, MD   2 months ago Subacute vaginitis   [place], MD   5 months ago Type 2 diabetes mellitus with morbid obesity [facility])   [place], MD   6 months ago [place] Ladell Pier, MD   8 months ago Essential hypertension   [place], RPH-CPP      Future Appointments            In 1 week Ladell Pier, MD [place]

## 2021-07-13 ENCOUNTER — Other Ambulatory Visit: Payer: Self-pay | Admitting: Internal Medicine

## 2021-07-13 ENCOUNTER — Other Ambulatory Visit: Payer: Self-pay

## 2021-07-13 DIAGNOSIS — Z794 Long term (current) use of insulin: Secondary | ICD-10-CM

## 2021-07-14 ENCOUNTER — Other Ambulatory Visit: Payer: Self-pay

## 2021-07-14 MED ORDER — ACCU-CHEK SOFTCLIX LANCETS MISC
6 refills | Status: AC
  Filled 2021-07-14: qty 100, 33d supply, fill #0

## 2021-07-14 MED ORDER — ACCU-CHEK GUIDE VI STRP
ORAL_STRIP | 6 refills | Status: DC
  Filled 2021-07-14: qty 100, 33d supply, fill #0

## 2021-07-14 NOTE — Telephone Encounter (Signed)
Requested medication (s) are due for refill today -expired Rx  Requested medication (s) are on the active medication list -yes  Future visit scheduled -yes  Last refill: 08/27/19 #[id number]  Notes to clinic: Request RF: Expired Rx  Requested Prescriptions  Pending Prescriptions Disp Refills   glucose blood (ACCU-CHEK GUIDE) test strip 100 each 6    Sig: Use to check blood sugar 3 times daily.     Endocrinology: Diabetes - Testing Supplies Passed - 07/13/2021 11:26 AM      Passed - Valid encounter within last 12 months    Recent Outpatient Visits           2 weeks ago Type 2 diabetes mellitus with morbid obesity ([place])   [place] Ladell Pier, MD   2 months ago Subacute vaginitis   [place] Karle Plumber B, MD   5 months ago Type 2 diabetes mellitus with morbid obesity [facility])   [place], MD   6 months ago [place] Ladell Pier, MD   9 months ago Essential hypertension   [place], RPH-CPP       Future Appointments             In 1 week Ladell Pier, MD [place]             Accu-Chek Softclix Lancets lancets 100 each 6    Sig: Use to check blood sugar 3 times daily.     Endocrinology: Diabetes - Testing Supplies Passed - 07/13/2021 11:26 AM      Passed - Valid encounter within last 12 months    Recent Outpatient Visits           2 weeks ago Type 2 diabetes mellitus with morbid obesity ([place])   [place] Ladell Pier, MD   2 months ago Subacute vaginitis   [place] Karle Plumber B, MD   5 months ago Type 2 diabetes mellitus with morbid obesity [facility])   [place] Ladell Pier, MD   6 months ago  [place] Ladell Pier, MD   9 months ago Essential hypertension   [place], RPH-CPP       Future Appointments             In 1 week Ladell Pier, MD [place]               Requested Prescriptions  Pending Prescriptions Disp Refills   glucose blood (ACCU-CHEK GUIDE) test strip 100 each 6    Sig: Use to check blood sugar 3 times daily.     Endocrinology: Diabetes - Testing Supplies Passed - 07/13/2021 11:26 AM      Passed - Valid encounter within last 12 months    Recent Outpatient Visits           2 weeks ago Type 2 diabetes mellitus with morbid obesity [facility])   [place] Ladell Pier, MD   2 months ago Subacute vaginitis   [place] Ladell Pier, MD   5 months ago Type  2 diabetes mellitus with morbid obesity [facility])   [place] Ladell Pier, MD   6 months ago [place] Ladell Pier, MD   9 months ago Essential hypertension   [place], Jarome [place], RPH-CPP       Future Appointments             In 1 week Ladell Pier, MD [place]             Accu-Chek Softclix Lancets lancets 100 each 6    Sig: Use to check blood sugar 3 times daily.     Endocrinology: Diabetes - Testing Supplies Passed - 07/13/2021 11:26 AM      Passed - Valid encounter within last 12 months    Recent Outpatient Visits           2 weeks ago Type 2 diabetes mellitus with morbid obesity ([place])   [place], MD   2 months ago Subacute vaginitis   [place], MD   5 months ago Type 2 diabetes mellitus with morbid  obesity [facility])   [place], MD   6 months ago [place], Deborah B, MD   9 months ago Essential hypertension   [place], RPH-CPP       Future Appointments             In 1 week Ladell Pier, MD [place]

## 2021-07-16 NOTE — Progress Notes (Signed)
[facility] Note  07/20/2021     CHIEF COMPLAINT Patient presents for Retina Follow Up   HISTORY OF PRESENT ILLNESS: Kimberly Frazier is a 61 y.o. female who presents to the clinic today for:   HPI     Retina Follow Up   Patient presents with  Diabetic Retinopathy.  In both eyes.  Duration of 4 weeks.  Since onset it is stable.  I, the attending physician,  performed the HPI with the patient and updated documentation appropriately.        Comments   4 week follow up NPDR OU- Doing well, no changes in vision since last visit. Eyes are itching a lot.  BS 167 yesterday, A1C 11 Using Cosopt BID OU, Prednisolone BID OU, and Ketorolac QID OU.      Last edited by Bernarda Caffey, MD on 07/20/2021  9:05 AM.     Pt is using Pred BID OU, Ketorolac QID OU and Cosopt BID OU  Referring physician: Ladell Pier, MD [place],  [place] [id number]  HISTORICAL INFORMATION:   Selected notes from the MEDICAL RECORD NUMBER Referred by Dr. Rutherford Guys for concern of CME    CURRENT MEDICATIONS: Current Outpatient Medications (Ophthalmic Drugs)  Medication Sig   brimonidine (ALPHAGAN) 0.2 % ophthalmic solution Place 1 drop into both eyes 2 (two) times daily.   dorzolamide-timolol (COSOPT) 22.3-6.8 MG/ML ophthalmic solution Place 1 drop into both eyes 2 (two) times daily.   ketorolac (ACULAR) 0.5 % ophthalmic solution Place 1 drop into both eyes 4 (four) times daily.   prednisoLONE acetate (PRED FORTE) 1 % ophthalmic suspension Place 1 drop into both eyes 4 (four) times daily.   Bromfenac Sodium (PROLENSA) 0.07 % SOLN Place 1 drop into both eyes 4 (four) times daily. (Patient not taking: Reported on 05/18/2021)   ketorolac (ACULAR) 0.5 % ophthalmic solution Place 1 drop into both eyes 4 (four) times daily.   prednisoLONE acetate (PRED FORTE) 1 % ophthalmic suspension Place 1 drop into both eyes 4 (four) times daily.   No current  facility-administered medications for this visit. (Ophthalmic Drugs)   Current Outpatient Medications (Other)  Medication Sig   Accu-Chek Softclix Lancets lancets Use to check blood sugar 3 times daily.   amLODipine (NORVASC) 10 MG tablet TAKE 1 TABLET (10 MG TOTAL) BY MOUTH DAILY.   aspirin EC 81 MG tablet Take 1 tablet (81 mg total) daily by mouth.   atorvastatin (LIPITOR) 80 MG tablet Take 1 tablet (80 mg total) by mouth daily.   butalbital-acetaminophen-caffeine (FIORICET) 50-325-40 MG tablet Take 1 tablet by mouth every 6 (six) hours as needed for headache.   carvedilol (COREG) 25 MG tablet TAKE 1 TABLET (25 MG TOTAL) BY MOUTH 2 (TWO) TIMES DAILY WITH A MEAL.   Dulaglutide (TRULICITY) 2.58 NI/7.7OE SOPN Inject 0.75 mg into the skin once a week.   fluconazole (DIFLUCAN) 150 MG tablet Take 1 tablet (150 mg total) by mouth daily.   glucose blood (ACCU-CHEK GUIDE) test strip Use to check blood sugar 3 times daily.   insulin aspart (NOVOLOG) 100 UNIT/ML injection Inject 28 Units into the skin 3 (three) times daily before meals.   insulin glargine (LANTUS SOLOSTAR) 100 UNIT/ML Solostar Pen INJECT 58 UNITS INTO THE SKIN DAILY.   isosorbide mononitrate (IMDUR) 60 MG 24 hr tablet TAKE 1 TABLET (60 MG TOTAL) BY MOUTH DAILY.   Na Sulfate-K Sulfate-Mg Sulf 17.5-3.13-1.6 GM/177ML SOLN Suprep (no substitutions)-TAKE AS DIRECTED.  nitroGLYCERIN (NITROSTAT) 0.4 MG SL tablet Place 1 tab under tongue for chest pain.  May repeat after 5 minutes x 2.  DO NOT TAKE MORE THAN 3 TABS DURING AN EPISODE OF CHEST PAIN   oxybutynin (DITROPAN-XL) 5 MG 24 hr tablet Take 1 tablet (5 mg total) by mouth at bedtime.   potassium chloride (KLOR-CON) 10 MEQ tablet Take 1 tablet (10 mEq total) by mouth daily.   Blood Glucose Monitoring Suppl (ACCU-CHEK GUIDE) w/Device KIT 1 kit by Does not apply route in the morning, at noon, and at bedtime. Use to check blood sugar 3 times daily.   Insulin Pen Needle 32G X 4 MM MISC Use as  directed.   Insulin Syringe-Needle U-100 (BD INSULIN SYRINGE) 29G X 1/2" 0.3 ML MISC Use to inject Novolog TID.   No current facility-administered medications for this visit. (Other)   REVIEW OF SYSTEMS: ROS   Positive for: Endocrine, Cardiovascular, Eyes Negative for: Constitutional, Gastrointestinal, Neurological, Skin, Genitourinary, Musculoskeletal, HENT, Respiratory, Psychiatric, Allergic/Imm, Heme/Lymph Last edited by Leonie Douglas, COA on 07/20/2021  8:13 AM.     ALLERGIES Allergies  Allergen Reactions   Jardiance [Empagliflozin] Other (See Comments)    Frequent yeast infection   PAST MEDICAL HISTORY Past Medical History:  Diagnosis Date   Adrenal adenoma, left    Arthritis    Cancer ([place])    CKD (chronic kidney disease), stage II    Coronary artery disease 07-28-2018 followed [facility])  currently due to no insurance   per cardiac cath 02-06-2015 (positive mild lateral ishcemia on stress test)--- dLAD 80%,  mLAD 40%,  mPDA 80% (small vessel),  ostial D1 70%,  CFx with lumial irregarlities-- medical management   Depression    Diabetic neuropathy ([place])    Diabetic retinopathy ([place])    NPDR OU   Headache    History of Bell's palsy 07/2011   per pt residual facial pain on left side occasionally   History of cancer of vagina 1999   per pt completed radiation and chemo   History of sepsis 12/30/2017   positive blood culter fro E.coli   Hyperlipidemia    Hypertension    Hypertensive retinopathy    OU   Hypokalemia    Insulin dependent type 2 diabetes mellitus, uncontrolled    followed by pcp---  A1c was 11.7 on 06-15-2018 in epic   Myocardial infarction [facility])    10 yrs ago?   Nocturia    Peripheral neuropathy    PMB (postmenopausal bleeding)    Wears dentures    upper   Wears glasses    Past Surgical History:  Procedure Laterality Date   BREAST BIOPSY  2012   benign   CARDIAC CATHETERIZATION N/A 02/06/2015   Procedure: Left Heart Cath and  Coronary Angiography;  Surgeon: Larey Dresser, MD;  Location: [facility];  Service: Cardiovascular;  Laterality: N/A;   CATARACT EXTRACTION Bilateral OD: 03/07/19, OS: 02/28/19   Dr. Gershon Crane   CATARACT EXTRACTION, BILATERAL     COLONOSCOPY     normal exam in [place], [place] 10-12 years ago   EYE SURGERY Bilateral OD: 03/07/19, OS: 02/28/19   Cat Sx - Dr. Gershon Crane   HYSTEROSCOPY WITH D & C N/A 08/01/2018   Procedure: DILATATION AND CURETTAGE /HYSTEROSCOPY;  Surgeon: Mora Bellman, MD;  Location: [facility];  Service: Gynecology;  Laterality: N/A;   ROBOTIC ASSISTED TOTAL HYSTERECTOMY WITH BILATERAL SALPINGO OOPHERECTOMY N/A 11/28/2018   Procedure: XI ROBOTIC  ASSISTED TOTAL HYSTERECTOMY WITH BILATERAL SALPINGO OOPHORECTOMY;  Surgeon: Everitt Amber, MD;  Location: [facility];  Service: Gynecology;  Laterality: N/A;   SENTINEL NODE BIOPSY N/A 11/28/2018   Procedure: SENTINEL NODE BIOPSY;  Surgeon: Everitt Amber, MD;  Location: [facility];  Service: Gynecology;  Laterality: N/A;   UMBILICAL HERNIA REPAIR  child   FAMILY HISTORY Family History  Problem Relation Age of Onset   Heart disease Mother    Hypertension Mother    Diabetes Mother    Cancer Father        hodgkins lymphoma   Heart disease Sister        heart attack   Diabetes Sister    Colon cancer Brother 12   Diabetes Maternal Aunt    Diabetes Maternal Uncle    Cancer Other        parent   Diabetes Other        parent   Heart disease Other        parent   Hyperlipidemia Other        parent   Hypertension Other        parent   Arthritis Other        parent   Breast cancer Neg Hx    Colon polyps Neg Hx    Esophageal cancer Neg Hx    Rectal cancer Neg Hx    Stomach cancer Neg Hx    SOCIAL HISTORY Social History   Tobacco Use   Smoking status: Never   Smokeless tobacco: Never  Vaping Use   Vaping Use: Never used  Substance Use Topics   Alcohol use: Not Currently   Drug use: Yes    Types:  Marijuana    Comment: every 2 weeks per pt       OPHTHALMIC EXAM:  Base Eye Exam     Visual Acuity (Snellen - Linear)       Right Left   Dist [place] 20/20- 20/400   Dist ph [place]  NI  OS the letter looks like it is splashing in and out states patient.         Tonometry (Tonopen, 8:21 AM)       Right Left   Pressure 26 22         Tonometry #2 (Tonopen, 8:22 AM)       Right Left   Pressure 26          Pupils       Dark Light Shape React APD   Right 3 2 Round Brisk None   Left 3 2 Round Minimal Trace         Visual Fields (Counting fingers)       Left Right    Full Full         Extraocular Movement       Right Left    Full Full         Neuro/Psych     Oriented x3: Yes   Mood/Affect: Normal         Dilation     Both eyes: 1.0% Mydriacyl, 2.5% Phenylephrine @ 8:22 AM           Slit Lamp and Fundus Exam     Slit Lamp Exam       Right Left   Lids/Lashes Dermatochalasis - upper lid Dermatochalasis - upper lid   Conjunctiva/Sclera Mild Melanosis Mild Melanosis   Cornea 1+ Punctate epithelial erosions 2+ central punctate epithelial erosions, decreased TBUT, mild tear film  debris, well healed temporal cataract wounds   Anterior Chamber Deep, 0.5+cell/pigment Deep, 0.5+cell/pigment   Iris Round and dilated, No NVI Round and dilated, No NVI   Lens PC IOL in good position with trace PCO PC IOL in good position with 1+PCO   Anterior Vitreous Vitreous syneresis Vitreous syneresis, Posterior vitreous detachment, vitreous condensations         Fundus Exam       Right Left   Disc Pallor, Sharp rim Compact, Pink and Sharp, temporal PPA   C/D Ratio 0.1 0.1   Macula Good foveal reflex, persistent edema inferior macula, scattered Microaneurysms Blunted foveal reflex, central edema - slightly increased, +exudates/MA/IRH -- greatest temporal macula   Vessels attenuated, Tortuous attenuated, Tortuous   Periphery Attached, scattered drusen, scattered  DBH Attached, scattered DBH greatest posteriorly, mild peripheral drusen, prominent DBH inferior disc           Refraction     Manifest Refraction       Sphere Dist VA   Right     Left [place] NI           IMAGING AND PROCEDURES  Imaging and Procedures for 07/20/2021  OCT, Retina - OU - Both Eyes       Right Eye Quality was good. Central Foveal Thickness: 279. Progression has been stable. Findings include abnormal foveal contour, intraretinal fluid, no SRF, intraretinal hyper-reflective material, vitreomacular adhesion (persistent IRF temporal macula and fovea).   Left Eye Quality was good. Central Foveal Thickness: 562. Progression has worsened. Findings include abnormal foveal contour, intraretinal fluid, intraretinal hyper-reflective material, vitreomacular adhesion , outer retinal atrophy, subretinal hyper-reflective material, no SRF (Mild interval increase in IRF/IRHM, greatest inf fovea and mac).   Notes *Images captured and stored on drive  Diagnosis / Impression:  Macular edema OU - likely combination of CME + DME OU OD: persistent IRF temporal macula and fovea OS: Mild interval increase in IRF/IRHM, greatest inf fovea and mac  Clinical management:  See below  Abbreviations: NFP - Normal foveal profile. CME - cystoid macular edema. PED - pigment epithelial detachment. IRF - intraretinal fluid. SRF - subretinal fluid. EZ - ellipsoid zone. ERM - epiretinal membrane. ORA - outer retinal atrophy. ORT - outer retinal tubulation. SRHM - subretinal hyper-reflective material      Intravitreal Injection, Pharmacologic Agent - OD - Right Eye       Time Out 07/20/2021. 8:46 AM. Confirmed correct patient, procedure, site, and patient consented.   Anesthesia Topical anesthesia was used. Anesthetic medications included Lidocaine 2%, Proparacaine 0.5%.   Procedure Preparation included 5% betadine to ocular surface, eyelid speculum. A (32g) needle was used.    Injection: 2 mg aflibercept 2 MG/0.05ML   Route: Intravitreal, Site: Right Eye   NDC: [id number], Lot: [id number], Expiration date: 04/30/2022, Waste: 0.05 mL   Post-op Post injection exam found visual acuity of at least counting fingers. The patient tolerated the procedure well. There were no complications. The patient received written and verbal post procedure care education. Post injection medications were not given.      Intravitreal Injection, Pharmacologic Agent - OS - Left Eye       Time Out 07/20/2021. 8:46 AM. Confirmed correct patient, procedure, site, and patient consented.   Anesthesia Topical anesthesia was used. Anesthetic medications included Lidocaine 2%, Proparacaine 0.5%.   Procedure Preparation included 5% betadine to ocular surface, eyelid speculum. A (32g) needle was used.   Injection: 6 mg faricimab-svoa 6 MG/0.05ML   Route: Intravitreal, Site:  Left Eye   [place]: [id number], Lot: [id number], Expiration date: 11/29/2022, Waste: 0 mL   Post-op Post injection exam found visual acuity of at least counting fingers. The patient tolerated the procedure well. There were no complications. The patient received written and verbal post procedure care education. Post injection medications were not given.             ASSESSMENT/PLAN:    ICD-10-CM   1. Moderate nonproliferative diabetic retinopathy of both eyes with macular edema associated with type 2 diabetes mellitus (HCC)  E11.3313 OCT, Retina - OU - Both Eyes    Intravitreal Injection, Pharmacologic Agent - OD - Right Eye    Intravitreal Injection, Pharmacologic Agent - OS - Left Eye    faricimab-svoa (VABYSMO) 66m/0.05mL intravitreal injection    aflibercept (EYLEA) SOLN 2 mg    2. CME (cystoid macular edema), bilateral  H35.353     3. Essential hypertension  I10     4. Hypertensive retinopathy of both eyes  H35.033     5. Pseudophakia of both eyes  Z96.1     6. Bilateral ocular hypertension  H40.053       1,2. Moderate Non-proliferative diabetic retinopathy, OU OD with combination DME/CME             **delayed to follow up from September 2021 to April 2022--7 mos instead of 4 wks**             - exam shows scattered IRH, edema and tortuous blood vessels OU             - FA (10.23.20) shows leaking MA OU and paracentral petaloid hyperfluorecence OD --> CME/[place]             - OCT shows diabetic macular edema, OU; OD with CME/[place] component contributing as well             - s/p IVA OS #1 (10.23.20), #2 (11.18.20), #3 (12.18.20), #4 (02.24.21), #5 (03.24.21)             - s/p IVA OD #1 (11.06.20), #2 (12.18.20), #3 (02.24.21), #4 (03.24.21), #5 (04.21.21), #6 (5.26.21), #7 (07.26.21), #8 (08.25.21), #9 (09.23.21), #10 (04.26.22)             - s/p IVE OS #1 (04.21.21), #2 (5.26.21), #3 (07.26.21), #4 (08.25.21), #5 (09.23.21), #6 (04.26.22), #7 (05.25.22), #8 (07.01.22), #9 (07.29.22), #10 (08.29.22 - JDM), #11 (09.26.22), #12 (10.24.22)             - s/p IVE OD #1 (05.25.22), #2 (07.01.22), #3 (07.29.22), #4 (08.29.22 - JDM), #5 (09.26.22), #6 (10.24.22), #7 (11.21.22), #8 (12.19.22), #9 (01.23.23)             - s/p Vabysmo OS #1 (11.21.22, sample), #2 (12.19.22), #3 (01.23.23)             - BCVA OD 20/20; OS 20/400 - decreased             - OCT today: OD: persistent IRF temporal macula and fovea; OZD:GLOVinterval increase in IRF/IRHM             - recommend IVE OD #10 and IV Vabysmo OS #4 today, 02.20.23             - pt wishes to proceed w/ injections             - RBA of procedure discussed, questions answered             - informed consent obtained             -  Avastin informed consent form signed and scanned on 12.18.2020             - Eylea OS informed consent form signed and scanned on 4.21.2021             - Eylea OD informed consent form signed and scanned on 05.25.22             - Vabysmo OS consent form signed and scanned on 11.21.22             - see procedure  note             - Eylea4U benefits investigation started, 04.21.21             - cont Pred Forte BID and Ketorolac QID OU              - f/u in 4 weeks, DFE, OCT, possible injection OU   3,4. Hypertensive retinopathy OU             - discussed importance of tight BP control             - monitor   5. Pseudophakia OU            - s/p CE/IOL OU Gershon Crane)                        OD: 03/07/2019                        OS: 02/28/2019             - IOLs in good position             - CME OU as above             - monitor   6. Ocular Hypertension OU             - IOP: 26,22 - ?steroid response             - continue cosopt BID OU  - add brimonidine BID OU  Ophthalmic Meds Ordered this visit:  Meds ordered this encounter  Medications   brimonidine (ALPHAGAN) 0.2 % ophthalmic solution    Sig: Place 1 drop into both eyes 2 (two) times daily.    Dispense:  10 mL    Refill:  6   faricimab-svoa (VABYSMO) 86m/0.05mL intravitreal injection   aflibercept (EYLEA) SOLN 2 mg     Return in about 4 weeks (around 08/17/2021) for f/u NPDR OU, DFE, OCT.  There are no Patient Instructions on file for this visit.   Explained the diagnoses, plan, and follow up with the patient and they expressed understanding.  Patient expressed understanding of the importance of proper follow up care.   This document serves as a record of services personally performed by BGardiner Sleeper MD, PhD. It was created on their behalf by DRoselee Nova COMT. The creation of this record is the provider's dictation and/or activities during the visit.  Electronically signed by: DRoselee Nova COMT 07/20/21 9:07 AM   BGardiner Sleeper M.D., Ph.D. Diseases & Surgery of the Retina and Vitreous [place] I have reviewed the above documentation for accuracy and completeness, and I agree with the above. BGardiner Sleeper M.D., Ph.D. 07/20/21 9:08 AM   Abbreviations: M myopia (nearsighted); A  astigmatism; H hyperopia (farsighted); P presbyopia; Mrx spectacle prescription;  CTL contact lenses; OD right  eye; OS left eye; OU both eyes  XT exotropia; ET esotropia; PEK punctate epithelial keratitis; PEE punctate epithelial erosions; DES dry eye syndrome; MGD meibomian gland dysfunction; ATs artificial tears; PFAT's preservative free artificial tears; [place] nuclear sclerotic cataract; PSC posterior subcapsular cataract; ERM epi-retinal membrane; PVD posterior vitreous detachment; RD retinal detachment; DM diabetes mellitus; DR diabetic retinopathy; NPDR non-proliferative diabetic retinopathy; PDR proliferative diabetic retinopathy; CSME clinically significant macular edema; DME diabetic macular edema; dbh dot blot hemorrhages; CWS cotton wool spot; POAG primary open angle glaucoma; C/D cup-to-disc ratio; HVF humphrey visual field; GVF goldmann visual field; OCT optical coherence tomography; IOP intraocular pressure; BRVO Branch retinal vein occlusion; CRVO central retinal vein occlusion; CRAO central retinal artery occlusion; BRAO branch retinal artery occlusion; RT retinal tear; SB scleral buckle; PPV pars plana vitrectomy; VH Vitreous hemorrhage; PRP panretinal laser photocoagulation; IVK intravitreal kenalog; VMT vitreomacular traction; MH Macular hole;  NVD neovascularization of the disc; NVE neovascularization elsewhere; AREDS age related eye disease study; ARMD age related macular degeneration; POAG primary open angle glaucoma; EBMD epithelial/anterior basement membrane dystrophy; ACIOL anterior chamber intraocular lens; IOL intraocular lens; PCIOL posterior chamber intraocular lens; Phaco/IOL phacoemulsification with intraocular lens placement; [place] photorefractive keratectomy; LASIK laser assisted in situ keratomileusis; HTN hypertension; DM diabetes mellitus; COPD chronic obstructive pulmonary disease

## 2021-07-20 ENCOUNTER — Ambulatory Visit (INDEPENDENT_AMBULATORY_CARE_PROVIDER_SITE_OTHER): Payer: Medicare Other | Admitting: Ophthalmology

## 2021-07-20 ENCOUNTER — Other Ambulatory Visit: Payer: Self-pay

## 2021-07-20 ENCOUNTER — Encounter (INDEPENDENT_AMBULATORY_CARE_PROVIDER_SITE_OTHER): Payer: Self-pay | Admitting: Ophthalmology

## 2021-07-20 DIAGNOSIS — H35033 Hypertensive retinopathy, bilateral: Secondary | ICD-10-CM | POA: Diagnosis not present

## 2021-07-20 DIAGNOSIS — H40053 Ocular hypertension, bilateral: Secondary | ICD-10-CM

## 2021-07-20 DIAGNOSIS — H35353 Cystoid macular degeneration, bilateral: Secondary | ICD-10-CM | POA: Diagnosis not present

## 2021-07-20 DIAGNOSIS — E113313 Type 2 diabetes mellitus with moderate nonproliferative diabetic retinopathy with macular edema, bilateral: Secondary | ICD-10-CM

## 2021-07-20 DIAGNOSIS — I10 Essential (primary) hypertension: Secondary | ICD-10-CM | POA: Diagnosis not present

## 2021-07-20 DIAGNOSIS — Z961 Presence of intraocular lens: Secondary | ICD-10-CM

## 2021-07-20 MED ORDER — BRIMONIDINE TARTRATE 0.2 % OP SOLN
1.0000 [drp] | Freq: Two times a day (BID) | OPHTHALMIC | 6 refills | Status: DC
  Filled 2021-07-20: qty 10, 37d supply, fill #0

## 2021-07-20 MED ORDER — AFLIBERCEPT 2MG/0.05ML IZ SOLN FOR KALEIDOSCOPE
2.0000 mg | INTRAVITREAL | Status: AC | PRN
  Administered 2021-07-20: 2 mg via INTRAVITREAL

## 2021-07-20 MED ORDER — FARICIMAB-SVOA 6 MG/0.05ML IZ SOLN
6.0000 mg | INTRAVITREAL | Status: AC | PRN
  Administered 2021-07-20: 6 mg via INTRAVITREAL

## 2021-07-22 ENCOUNTER — Other Ambulatory Visit: Payer: Self-pay

## 2021-07-23 ENCOUNTER — Other Ambulatory Visit: Payer: Self-pay

## 2021-07-23 ENCOUNTER — Ambulatory Visit: Payer: Medicare Other | Admitting: Internal Medicine

## 2021-07-27 ENCOUNTER — Other Ambulatory Visit: Payer: Self-pay

## 2021-07-28 ENCOUNTER — Other Ambulatory Visit: Payer: Self-pay

## 2021-07-29 ENCOUNTER — Other Ambulatory Visit: Payer: Self-pay

## 2021-07-30 ENCOUNTER — Other Ambulatory Visit: Payer: Self-pay

## 2021-08-11 ENCOUNTER — Other Ambulatory Visit: Payer: Self-pay | Admitting: Internal Medicine

## 2021-08-11 ENCOUNTER — Other Ambulatory Visit: Payer: Self-pay

## 2021-08-11 NOTE — Telephone Encounter (Signed)
Requested medication (s) are due for refill today:  ? ?Requested medication (s) are on the active medication list: Yes ? ?Last refill:  05/07/20 ? ?Future visit scheduled: Yes ? ?Notes to clinic:  See request. ? ? ? ?Requested Prescriptions  ?Pending Prescriptions Disp Refills  ? Insulin Syringe-Needle U-100 (BD VEO INSULIN SYRINGE U/F) 31G X 15/64" 0.3 ML MISC 100 each 2  ?  Sig: USE TO INJECT NOVOLOG THREE TIMES DAILY  ?  ? There is no refill protocol information for this order  ?  ? ?

## 2021-08-12 ENCOUNTER — Other Ambulatory Visit: Payer: Self-pay

## 2021-08-12 ENCOUNTER — Other Ambulatory Visit: Payer: Self-pay | Admitting: Internal Medicine

## 2021-08-12 MED ORDER — BD PEN NEEDLE NANO U/F 32G X 4 MM MISC
1 refills | Status: DC
  Filled 2021-08-12: qty 100, 30d supply, fill #0
  Filled 2022-01-20: qty 100, 90d supply, fill #0

## 2021-08-13 NOTE — Progress Notes (Shared)
?[facility] Note ? ?08/17/2021 ? ?  ? ?CHIEF COMPLAINT ?Patient presents for No chief complaint on file. ? ? ?HISTORY OF PRESENT ILLNESS: ?Kimberly Frazier is a 61 y.o. female who presents to the clinic today for:  ? ? ? ?Pt is using Pred BID OU, Ketorolac QID OU and Cosopt BID OU ? ?Referring physician: ?Ladell Pier, MD ?[address] ?[address] ?[place],  [place] [id number] ? ?HISTORICAL INFORMATION:  ? ?Selected notes from the [facility] ?Referred by Dr. Rutherford Guys for concern of CME ?  ? ?CURRENT MEDICATIONS: ?Current Outpatient Medications (Ophthalmic Drugs)  ?Medication Sig  ? brimonidine (ALPHAGAN) 0.2 % ophthalmic solution Place 1 drop into both eyes 2 (two) times daily.  ? Bromfenac Sodium (PROLENSA) 0.07 % SOLN Place 1 drop into both eyes 4 (four) times daily. (Patient not taking: Reported on 05/18/2021)  ? dorzolamide-timolol (COSOPT) 22.3-6.8 MG/ML ophthalmic solution Place 1 drop into both eyes 2 (two) times daily.  ? ketorolac (ACULAR) 0.5 % ophthalmic solution Place 1 drop into both eyes 4 (four) times daily.  ? ketorolac (ACULAR) 0.5 % ophthalmic solution Place 1 drop into both eyes 4 (four) times daily.  ? prednisoLONE acetate (PRED FORTE) 1 % ophthalmic suspension Place 1 drop into both eyes 4 (four) times daily.  ? prednisoLONE acetate (PRED FORTE) 1 % ophthalmic suspension Place 1 drop into both eyes 4 (four) times daily.  ? ?No current facility-administered medications for this visit. (Ophthalmic Drugs)  ? ?Current Outpatient Medications (Other)  ?Medication Sig  ? Accu-Chek Softclix Lancets lancets Use to check blood sugar 3 times daily.  ? glucose blood (ACCU-CHEK GUIDE) test strip Use to check blood sugar 3 times daily.  ? amLODipine (NORVASC) 10 MG tablet TAKE 1 TABLET (10 MG TOTAL) BY MOUTH DAILY.  ? aspirin EC 81 MG tablet Take 1 tablet (81 mg total) daily by mouth.  ? atorvastatin (LIPITOR) 80 MG tablet Take 1 tablet (80 mg total) by mouth daily.  ?  Blood Glucose Monitoring Suppl (ACCU-CHEK GUIDE) w/Device KIT 1 kit by Does not apply route in the morning, at noon, and at bedtime. Use to check blood sugar 3 times daily.  ? butalbital-acetaminophen-caffeine (FIORICET) 50-325-40 MG tablet Take 1 tablet by mouth every 6 (six) hours as needed for headache.  ? carvedilol (COREG) 25 MG tablet TAKE 1 TABLET (25 MG TOTAL) BY MOUTH 2 (TWO) TIMES DAILY WITH A MEAL.  ? Dulaglutide (TRULICITY) 3.53 IR/4.4RX SOPN Inject 0.75 mg into the skin once a week.  ? fluconazole (DIFLUCAN) 150 MG tablet Take 1 tablet (150 mg total) by mouth daily.  ? insulin aspart (NOVOLOG) 100 UNIT/ML injection Inject 28 Units into the skin 3 (three) times daily before meals.  ? insulin glargine (LANTUS SOLOSTAR) 100 UNIT/ML Solostar Pen INJECT 58 UNITS INTO THE SKIN DAILY.  ? Insulin Pen Needle (BD PEN NEEDLE NANO U/F) 32G X 4 MM MISC Use as directed.  ? Insulin Syringe-Needle U-100 (BD INSULIN SYRINGE) 29G X 1/2" 0.3 ML MISC Use to inject Novolog TID.  ? isosorbide mononitrate (IMDUR) 60 MG 24 hr tablet TAKE 1 TABLET (60 MG TOTAL) BY MOUTH DAILY.  ? Na Sulfate-K Sulfate-Mg Sulf 17.5-3.13-1.6 GM/177ML SOLN Suprep (no substitutions)-TAKE AS DIRECTED.  ? nitroGLYCERIN (NITROSTAT) 0.4 MG SL tablet Place 1 tab under tongue for chest pain.  May repeat after 5 minutes x 2.  DO NOT TAKE MORE THAN 3 TABS DURING AN EPISODE OF CHEST PAIN  ?  oxybutynin (DITROPAN-XL) 5 MG 24 hr tablet Take 1 tablet (5 mg total) by mouth at bedtime.  ? potassium chloride (KLOR-CON) 10 MEQ tablet Take 1 tablet (10 mEq total) by mouth daily.  ? ?No current facility-administered medications for this visit. (Other)  ? ?REVIEW OF SYSTEMS: ? ? ?ALLERGIES ?Allergies  ?Allergen Reactions  ? Jardiance [Empagliflozin] Other (See Comments)  ?  Frequent yeast infection  ? ?PAST MEDICAL HISTORY ?Past Medical History:  ?Diagnosis Date  ? Adrenal adenoma, left   ? Arthritis   ? Cancer [facility])   ? CKD (chronic kidney disease), stage II   ?  Coronary artery disease 07-28-2018 followed by pcp([facility])  currently due to no insurance  ? per cardiac cath 02-06-2015 (positive mild lateral ishcemia on stress test)--- dLAD 80%,  mLAD 40%,  mPDA 80% (small vessel),  ostial D1 70%,  CFx with lumial irregarlities-- medical management  ? Depression   ? Diabetic neuropathy ([place])   ? Diabetic retinopathy ([place])   ? NPDR OU  ? Headache   ? History of Bell's palsy 07/2011  ? per pt residual facial pain on left side occasionally  ? History of cancer of vagina 1999  ? per pt completed radiation and chemo  ? History of sepsis 12/30/2017  ? positive blood culter fro E.coli  ? Hyperlipidemia   ? Hypertension   ? Hypertensive retinopathy   ? OU  ? Hypokalemia   ? Insulin dependent type 2 diabetes mellitus, uncontrolled   ? followed by pcp---  A1c was 11.7 on 06-15-2018 in epic  ? Myocardial infarction [facility])   ? 10 yrs ago?  ? Nocturia   ? Peripheral neuropathy   ? PMB (postmenopausal bleeding)   ? Wears dentures   ? upper  ? Wears glasses   ? ?Past Surgical History:  ?Procedure Laterality Date  ? BREAST BIOPSY  2012  ? benign  ? CARDIAC CATHETERIZATION N/A 02/06/2015  ? Procedure: Left Heart Cath and Coronary Angiography;  Surgeon: Larey Dresser, MD;  Location: [place] [facility];  Service: Cardiovascular;  Laterality: N/A;  ? CATARACT EXTRACTION Bilateral OD: 03/07/19, OS: 02/28/19  ? Dr. Gershon Crane  ? CATARACT EXTRACTION, BILATERAL    ? COLONOSCOPY    ? normal exam in [place], [place] abour 10-12 years ago  ? EYE SURGERY Bilateral OD: 03/07/19, OS: 02/28/19  ? Cat Sx - Dr. Gershon Crane  ? HYSTEROSCOPY WITH D & C N/A 08/01/2018  ? Procedure: DILATATION AND CURETTAGE /HYSTEROSCOPY;  Surgeon: Mora Bellman, MD;  Location: [place];  Service: Gynecology;  Laterality: N/A;  ? ROBOTIC ASSISTED TOTAL HYSTERECTOMY WITH BILATERAL SALPINGO OOPHERECTOMY N/A 11/28/2018  ? Procedure: XI ROBOTIC ASSISTED TOTAL HYSTERECTOMY WITH BILATERAL SALPINGO OOPHORECTOMY;   Surgeon: Everitt Amber, MD;  [facility]: [facility];  Service: Gynecology;  Laterality: N/A;  ? SENTINEL NODE BIOPSY N/A 11/28/2018  ? Procedure: SENTINEL NODE BIOPSY;  Surgeon: Everitt Amber, MD;  Location: [facility];  Service: Gynecology;  Laterality: N/A;  ? UMBILICAL HERNIA REPAIR  child  ? ?FAMILY HISTORY ?Family History  ?Problem Relation Age of Onset  ? Heart disease Mother   ? Hypertension Mother   ? Diabetes Mother   ? Cancer Father   ?     hodgkins lymphoma  ? Heart disease Sister   ?     heart attack  ? Diabetes Sister   ? Colon cancer Brother 60  ? Diabetes Maternal Aunt   ? Diabetes Maternal Uncle   ?  Cancer Other   ?     parent  ? Diabetes Other   ?     parent  ? Heart disease Other   ?     parent  ? Hyperlipidemia Other   ?     parent  ? Hypertension Other   ?     parent  ? Arthritis Other   ?     parent  ? Breast cancer Neg Hx   ? Colon polyps Neg Hx   ? Esophageal cancer Neg Hx   ? Rectal cancer Neg Hx   ? Stomach cancer Neg Hx   ? ?SOCIAL HISTORY ?Social History  ? ?Tobacco Use  ? Smoking status: Never  ? Smokeless tobacco: Never  ?Vaping Use  ? Vaping Use: Never used  ?Substance Use Topics  ? Alcohol use: Not Currently  ? Drug use: Yes  ?  Types: Marijuana  ?  Comment: every 2 weeks per pt  ?  ? ?  ?OPHTHALMIC EXAM: ? ?Not recorded ?  ? ?IMAGING AND PROCEDURES  ?Imaging and Procedures for 08/17/2021 ? ? ? ?  ?  ? ?  ?ASSESSMENT/PLAN: ? ?  ICD-10-CM   ?1. Moderate nonproliferative diabetic retinopathy of both eyes with macular edema associated with type 2 diabetes mellitus ([place])  Y10.1751   ?  ?2. CME (cystoid macular edema), bilateral  H35.353   ?  ?3. Essential hypertension  I10   ?  ?4. Hypertensive retinopathy of both eyes  H35.033   ?  ?5. Pseudophakia of both eyes  Z96.1   ?  ?6. Bilateral ocular hypertension  H40.053   ?  ? ? ?1,2. Moderate Non-proliferative diabetic retinopathy, OU ?OD with combination DME/CME ?            **delayed to follow up from September 2021 to April 2022--7 mos instead of 4  wks** ?            - exam shows scattered [place], edema and tortuous blood vessels OU ?            - FA (10.23.20) shows leaking MA OU and paracentral petaloid hyperfluorecence OD --> CME/[place] ?            -

## 2021-08-17 ENCOUNTER — Encounter (INDEPENDENT_AMBULATORY_CARE_PROVIDER_SITE_OTHER): Payer: Medicare Other | Admitting: Ophthalmology

## 2021-08-18 ENCOUNTER — Ambulatory Visit: Payer: Medicare Other | Admitting: Pharmacist

## 2021-08-19 ENCOUNTER — Ambulatory Visit (INDEPENDENT_AMBULATORY_CARE_PROVIDER_SITE_OTHER): Payer: Medicare Other | Admitting: Ophthalmology

## 2021-08-19 ENCOUNTER — Encounter (INDEPENDENT_AMBULATORY_CARE_PROVIDER_SITE_OTHER): Payer: Self-pay | Admitting: Ophthalmology

## 2021-08-19 ENCOUNTER — Other Ambulatory Visit: Payer: Self-pay

## 2021-08-19 DIAGNOSIS — H35353 Cystoid macular degeneration, bilateral: Secondary | ICD-10-CM | POA: Diagnosis not present

## 2021-08-19 DIAGNOSIS — H35033 Hypertensive retinopathy, bilateral: Secondary | ICD-10-CM

## 2021-08-19 DIAGNOSIS — I10 Essential (primary) hypertension: Secondary | ICD-10-CM

## 2021-08-19 DIAGNOSIS — E113313 Type 2 diabetes mellitus with moderate nonproliferative diabetic retinopathy with macular edema, bilateral: Secondary | ICD-10-CM | POA: Diagnosis not present

## 2021-08-19 DIAGNOSIS — Z961 Presence of intraocular lens: Secondary | ICD-10-CM

## 2021-08-19 DIAGNOSIS — H40053 Ocular hypertension, bilateral: Secondary | ICD-10-CM

## 2021-08-19 MED ORDER — AFLIBERCEPT 2MG/0.05ML IZ SOLN FOR KALEIDOSCOPE
2.0000 mg | INTRAVITREAL | Status: AC | PRN
  Administered 2021-08-19: 2 mg via INTRAVITREAL

## 2021-08-19 MED ORDER — FARICIMAB-SVOA 6 MG/0.05ML IZ SOLN
6.0000 mg | INTRAVITREAL | Status: AC | PRN
  Administered 2021-08-19: 6 mg via INTRAVITREAL

## 2021-08-19 NOTE — Progress Notes (Signed)
?[facility] Note ? ?08/19/2021 ? ?  ? ?CHIEF COMPLAINT ?Patient presents for Retina Follow Up ? ? ?HISTORY OF PRESENT ILLNESS: ?Kimberly Frazier is a 61 y.o. female who presents to the clinic today for:  ? ?HPI   ? ? Retina Follow Up   ?Patient presents with  Diabetic Retinopathy.  In both eyes.  This started 4 weeks ago.  I, the attending physician,  performed the HPI with the patient and updated documentation appropriately. ? ?  ?  ? ? Comments   ?Patient here for 4 weeks retina follow up for NPDR OU. Patient states vision about the same. Eye not hurting.  ? ?  ?  ?Last edited by Bernarda Caffey, MD on 08/19/2021  9:15 AM.  ?  ? ?Pt states no change in vision, pt states her blood sugar is running around 136-160, last A1c was 11.0, she has an appt with her dr next week ? ?Referring physician: ?Ladell Pier, MD ?[address] ?[address] ?[place],  [place] [address] ? ?HISTORICAL INFORMATION:  ? ?Selected notes from the [facility] ?Referred by Dr. Rutherford Guys for concern of CME ?  ? ?CURRENT MEDICATIONS: ?Current Outpatient Medications (Ophthalmic Drugs)  ?Medication Sig  ? brimonidine (ALPHAGAN) 0.2 % ophthalmic solution Place 1 drop into both eyes 2 (two) times daily.  ? dorzolamide-timolol (COSOPT) 22.3-6.8 MG/ML ophthalmic solution Place 1 drop into both eyes 2 (two) times daily.  ? ketorolac (ACULAR) 0.5 % ophthalmic solution Place 1 drop into both eyes 4 (four) times daily.  ? ketorolac (ACULAR) 0.5 % ophthalmic solution Place 1 drop into both eyes 4 (four) times daily.  ? prednisoLONE acetate (PRED FORTE) 1 % ophthalmic suspension Place 1 drop into both eyes 4 (four) times daily.  ? prednisoLONE acetate (PRED FORTE) 1 % ophthalmic suspension Place 1 drop into both eyes 4 (four) times daily.  ? Bromfenac Sodium (PROLENSA) 0.07 % SOLN Place 1 drop into both eyes 4 (four) times daily. (Patient not taking: Reported on 05/18/2021)  ? ?No current facility-administered medications  for this visit. (Ophthalmic Drugs)  ? ?Current Outpatient Medications (Other)  ?Medication Sig  ? Accu-Chek Softclix Lancets lancets Use to check blood sugar 3 times daily.  ? amLODipine (NORVASC) 10 MG tablet TAKE 1 TABLET (10 MG TOTAL) BY MOUTH DAILY.  ? aspirin EC 81 MG tablet Take 1 tablet (81 mg total) daily by mouth.  ? atorvastatin (LIPITOR) 80 MG tablet Take 1 tablet (80 mg total) by mouth daily.  ? Blood Glucose Monitoring Suppl (ACCU-CHEK GUIDE) w/Device KIT 1 kit by Does not apply route in the morning, at noon, and at bedtime. Use to check blood sugar 3 times daily.  ? butalbital-acetaminophen-caffeine (FIORICET) 50-325-40 MG tablet Take 1 tablet by mouth every 6 (six) hours as needed for headache.  ? carvedilol (COREG) 25 MG tablet TAKE 1 TABLET (25 MG TOTAL) BY MOUTH 2 (TWO) TIMES DAILY WITH A MEAL.  ? Dulaglutide (TRULICITY) 3.01 SW/1.0XN SOPN Inject 0.75 mg into the skin once a week.  ? glucose blood (ACCU-CHEK GUIDE) test strip Use to check blood sugar 3 times daily.  ? insulin aspart (NOVOLOG) 100 UNIT/ML injection Inject 28 Units into the skin 3 (three) times daily before meals.  ? insulin glargine (LANTUS SOLOSTAR) 100 UNIT/ML Solostar Pen INJECT 58 UNITS INTO THE SKIN DAILY.  ? Insulin Pen Needle (BD PEN NEEDLE NANO U/F) 32G X 4 MM MISC Use as directed.  ?  Insulin Syringe-Needle U-100 (BD INSULIN SYRINGE) 29G X 1/2" 0.3 ML MISC Use to inject Novolog TID.  ? isosorbide mononitrate (IMDUR) 60 MG 24 hr tablet TAKE 1 TABLET (60 MG TOTAL) BY MOUTH DAILY.  ? Na Sulfate-K Sulfate-Mg Sulf 17.5-3.13-1.6 GM/177ML SOLN Suprep (no substitutions)-TAKE AS DIRECTED.  ? nitroGLYCERIN (NITROSTAT) 0.4 MG SL tablet Place 1 tab under tongue for chest pain.  May repeat after 5 minutes x 2.  DO NOT TAKE MORE THAN 3 TABS DURING AN EPISODE OF CHEST PAIN  ? oxybutynin (DITROPAN-XL) 5 MG 24 hr tablet Take 1 tablet (5 mg total) by mouth at bedtime.  ? potassium chloride (KLOR-CON) 10 MEQ tablet Take 1 tablet (10 mEq total)  by mouth daily.  ? fluconazole (DIFLUCAN) 150 MG tablet Take 1 tablet (150 mg total) by mouth daily.  ? ?No current facility-administered medications for this visit. (Other)  ? ?REVIEW OF SYSTEMS: ?ROS   ?Positive for: Endocrine, Cardiovascular, Eyes ?Negative for: Constitutional, Gastrointestinal, Neurological, Skin, Genitourinary, Musculoskeletal, HENT, Respiratory, Psychiatric, Allergic/Imm, Heme/Lymph ?Last edited by Theodore Demark, COA on 08/19/2021  7:56 AM.  ?  ? ?ALLERGIES ?Allergies  ?Allergen Reactions  ? Jardiance [Empagliflozin] Other (See Comments)  ?  Frequent yeast infection  ? ?PAST MEDICAL HISTORY ?Past Medical History:  ?Diagnosis Date  ? Adrenal adenoma, left   ? Arthritis   ? Cancer [facility])   ? CKD (chronic kidney disease), stage II   ? Coronary artery disease 07-28-2018 followed by pcp([facility])  currently due to no insurance  ? per cardiac cath 02-06-2015 (positive mild lateral ishcemia on stress test)--- dLAD 80%,  mLAD 40%,  mPDA 80% (small vessel),  ostial D1 70%,  CFx with lumial irregarlities-- medical management  ? Depression   ? Diabetic neuropathy ([place])   ? Diabetic retinopathy ([place])   ? NPDR OU  ? Headache   ? History of Bell's palsy 07/2011  ? per pt residual facial pain on left side occasionally  ? History of cancer of vagina 1999  ? per pt completed radiation and chemo  ? History of sepsis 12/30/2017  ? positive blood culter fro E.coli  ? Hyperlipidemia   ? Hypertension   ? Hypertensive retinopathy   ? OU  ? Hypokalemia   ? Insulin dependent type 2 diabetes mellitus, uncontrolled   ? followed by pcp---  A1c was 11.7 on 06-15-2018 in epic  ? Myocardial infarction [facility])   ? 10 yrs ago?  ? Nocturia   ? Peripheral neuropathy   ? PMB (postmenopausal bleeding)   ? Wears dentures   ? upper  ? Wears glasses   ? ?Past Surgical History:  ?Procedure Laterality Date  ? BREAST BIOPSY  2012  ? benign  ? CARDIAC CATHETERIZATION N/A 02/06/2015  ? Procedure: Left Heart Cath and  Coronary Angiography;  Surgeon: Larey Dresser, MD;  Location: [place] [facility];  Service: Cardiovascular;  Laterality: N/A;  ? CATARACT EXTRACTION Bilateral OD: 03/07/19, OS: 02/28/19  ? Dr. Gershon Crane  ? CATARACT EXTRACTION, BILATERAL    ? COLONOSCOPY    ? normal exam in [place], [place] abour 10-12 years ago  ? EYE SURGERY Bilateral OD: 03/07/19, OS: 02/28/19  ? Cat Sx - Dr. Gershon Crane  ? HYSTEROSCOPY WITH D & C N/A 08/01/2018  ? Procedure: DILATATION AND CURETTAGE /HYSTEROSCOPY;  Surgeon: Mora Bellman, MD;  Location: [facility];  Service: Gynecology;  Laterality: N/A;  ? ROBOTIC ASSISTED TOTAL HYSTERECTOMY WITH BILATERAL SALPINGO OOPHERECTOMY N/A 11/28/2018  ?  Procedure: XI ROBOTIC ASSISTED TOTAL HYSTERECTOMY WITH BILATERAL SALPINGO OOPHORECTOMY;  Surgeon: Everitt Amber, MD;  [facility];  Service: Gynecology;  Laterality: N/A;  ? SENTINEL NODE BIOPSY N/A 11/28/2018  ? Procedure: SENTINEL NODE BIOPSY;  Surgeon: Everitt Amber, MD;  [facility];  Service: Gynecology;  Laterality: N/A;  ? UMBILICAL HERNIA REPAIR  child  ? ?FAMILY HISTORY ?Family History  ?Problem Relation Age of Onset  ? Heart disease Mother   ? Hypertension Mother   ? Diabetes Mother   ? Cancer Father   ?     hodgkins lymphoma  ? Heart disease Sister   ?     heart attack  ? Diabetes Sister   ? Colon cancer Brother 38  ? Diabetes Maternal Aunt   ? Diabetes Maternal Uncle   ? Cancer Other   ?     parent  ? Diabetes Other   ?     parent  ? Heart disease Other   ?     parent  ? Hyperlipidemia Other   ?     parent  ? Hypertension Other   ?     parent  ? Arthritis Other   ?     parent  ? Breast cancer Neg Hx   ? Colon polyps Neg Hx   ? Esophageal cancer Neg Hx   ? Rectal cancer Neg Hx   ? Stomach cancer Neg Hx   ? ?SOCIAL HISTORY ?Social History  ? ?Tobacco Use  ? Smoking status: Never  ? Smokeless tobacco: Never  ?Vaping Use  ? Vaping Use: Never used  ?Substance Use Topics  ? Alcohol use: Not Currently  ? Drug use: Yes  ?  Types:  Marijuana  ?  Comment: every 2 weeks per pt  ?  ? ?  ?OPHTHALMIC EXAM: ? ?Base Eye Exam   ? ? Visual Acuity (Snellen - Linear)   ? ?   Right Left  ? Dist [place] 20/20 20/350 +1  ? Dist ph [place]  20/300 -1  ? ?  ?  ? ? T

## 2021-08-26 ENCOUNTER — Other Ambulatory Visit: Payer: Self-pay | Admitting: Internal Medicine

## 2021-08-26 ENCOUNTER — Other Ambulatory Visit: Payer: Self-pay

## 2021-08-26 DIAGNOSIS — E1165 Type 2 diabetes mellitus with hyperglycemia: Secondary | ICD-10-CM

## 2021-08-26 MED ORDER — LANTUS SOLOSTAR 100 UNIT/ML ~~LOC~~ SOPN
PEN_INJECTOR | SUBCUTANEOUS | 0 refills | Status: DC
  Filled 2021-08-26: qty 15, fill #0
  Filled 2021-09-08: qty 15, 25d supply, fill #0

## 2021-08-27 ENCOUNTER — Other Ambulatory Visit: Payer: Self-pay

## 2021-08-28 ENCOUNTER — Other Ambulatory Visit: Payer: Self-pay

## 2021-09-01 LAB — LIPID PANEL
Chol/HDL Ratio: 3.2 ratio (ref 0.0–4.4)
Cholesterol, Total: 131 mg/dL (ref 100–199)
HDL: 41 mg/dL (ref >39)
LDL Chol Calc (NIH): 71 mg/dL (ref 0–99)
Triglycerides: 103 mg/dL (ref 0–149)
VLDL Cholesterol Cal: 19 mg/dL (ref 5–40)

## 2021-09-02 ENCOUNTER — Other Ambulatory Visit: Payer: Self-pay

## 2021-09-02 DIAGNOSIS — Z79899 Other long term (current) drug therapy: Secondary | ICD-10-CM

## 2021-09-02 DIAGNOSIS — E7800 Pure hypercholesterolemia, unspecified: Secondary | ICD-10-CM

## 2021-09-02 MED ORDER — EZETIMIBE 10 MG PO TABS
10.0000 mg | ORAL_TABLET | Freq: Every day | ORAL | 3 refills | Status: DC
  Filled 2021-09-02: qty 90, 90d supply, fill #0

## 2021-09-08 ENCOUNTER — Ambulatory Visit: Payer: Medicare Other | Admitting: Pharmacist

## 2021-09-08 ENCOUNTER — Other Ambulatory Visit: Payer: Self-pay

## 2021-09-10 NOTE — Progress Notes (Signed)
?[facility] Note ? ?09/16/2021 ? ?  ? ?CHIEF COMPLAINT ?Patient presents for Retina Follow Up ? ? ?HISTORY OF PRESENT ILLNESS: ?Kimberly Frazier is a 61 y.o. female who presents to the clinic today for:  ? ?HPI   ? ? Retina Follow Up   ?Patient presents with  Diabetic Retinopathy.  In both eyes.  This started 4 weeks ago.  I, the attending physician,  performed the HPI with the patient and updated documentation appropriately. ? ?  ?  ? ? Comments   ?Patient here for 4 weeks retina follow up for NPDR OU. Patient states vision doing pretty good. No eye pain.  ? ?  ?  ?Last edited by Bernarda Caffey, MD on 09/16/2021  9:26 AM.  ?  ? ?Referring physician: ?Ladell Pier, MD ?[address] ?[address] ?[place],  [place] [address] ? ?HISTORICAL INFORMATION:  ? ?Selected notes from the [facility] ?Referred by Dr. Rutherford Guys for concern of CME ?  ? ?CURRENT MEDICATIONS: ?Current Outpatient Medications (Ophthalmic Drugs)  ?Medication Sig  ? brimonidine (ALPHAGAN) 0.2 % ophthalmic solution Place 1 drop into both eyes 2 (two) times daily.  ? dorzolamide-timolol (COSOPT) 22.3-6.8 MG/ML ophthalmic solution Place 1 drop into both eyes 2 (two) times daily.  ? ketorolac (ACULAR) 0.5 % ophthalmic solution Place 1 drop into both eyes 4 (four) times daily.  ? ketorolac (ACULAR) 0.5 % ophthalmic solution Place 1 drop into both eyes 4 (four) times daily.  ? prednisoLONE acetate (PRED FORTE) 1 % ophthalmic suspension Place 1 drop into both eyes 4 (four) times daily.  ? prednisoLONE acetate (PRED FORTE) 1 % ophthalmic suspension Place 1 drop into both eyes 4 (four) times daily.  ? Bromfenac Sodium (PROLENSA) 0.07 % SOLN Place 1 drop into both eyes 4 (four) times daily. (Patient not taking: Reported on 05/18/2021)  ? ?No current facility-administered medications for this visit. (Ophthalmic Drugs)  ? ?Current Outpatient Medications (Other)  ?Medication Sig  ? Accu-Chek Softclix Lancets lancets Use to check  blood sugar 3 times daily.  ? amLODipine (NORVASC) 10 MG tablet TAKE 1 TABLET (10 MG TOTAL) BY MOUTH DAILY.  ? aspirin EC 81 MG tablet Take 1 tablet (81 mg total) daily by mouth.  ? Blood Glucose Monitoring Suppl (ACCU-CHEK GUIDE) w/Device KIT 1 kit by Does not apply route in the morning, at noon, and at bedtime. Use to check blood sugar 3 times daily.  ? butalbital-acetaminophen-caffeine (FIORICET) 50-325-40 MG tablet Take 1 tablet by mouth every 6 (six) hours as needed for headache.  ? carvedilol (COREG) 25 MG tablet TAKE 1 TABLET (25 MG TOTAL) BY MOUTH 2 (TWO) TIMES DAILY WITH A MEAL.  ? Dulaglutide (TRULICITY) 7.41 UL/8.4TX SOPN Inject 0.75 mg into the skin once a week.  ? ezetimibe (ZETIA) 10 MG tablet Take 1 tablet (10 mg total) by mouth daily.  ? fluconazole (DIFLUCAN) 150 MG tablet Take 1 tablet (150 mg total) by mouth daily.  ? glucose blood (ACCU-CHEK GUIDE) test strip Use to check blood sugar 3 times daily.  ? insulin aspart (NOVOLOG) 100 UNIT/ML injection Inject 28 Units into the skin 3 (three) times daily before meals.  ? insulin glargine (LANTUS SOLOSTAR) 100 UNIT/ML Solostar Pen INJECT 58 UNITS INTO THE SKIN DAILY.  ? Insulin Pen Needle (BD PEN NEEDLE NANO U/F) 32G X 4 MM MISC Use as directed.  ? Insulin Syringe-Needle U-100 (BD INSULIN SYRINGE) 29G X 1/2" 0.3 ML MISC Use  to inject Novolog TID.  ? isosorbide mononitrate (IMDUR) 60 MG 24 hr tablet TAKE 1 TABLET (60 MG TOTAL) BY MOUTH DAILY.  ? Na Sulfate-K Sulfate-Mg Sulf 17.5-3.13-1.6 GM/177ML SOLN Suprep (no substitutions)-TAKE AS DIRECTED.  ? nitroGLYCERIN (NITROSTAT) 0.4 MG SL tablet Place 1 tab under tongue for chest pain.  May repeat after 5 minutes x 2.  DO NOT TAKE MORE THAN 3 TABS DURING AN EPISODE OF CHEST PAIN  ? oxybutynin (DITROPAN-XL) 5 MG 24 hr tablet Take 1 tablet (5 mg total) by mouth at bedtime.  ? potassium chloride (KLOR-CON) 10 MEQ tablet Take 1 tablet (10 mEq total) by mouth daily.  ? atorvastatin (LIPITOR) 80 MG tablet Take 1  tablet (80 mg total) by mouth daily.  ? ?No current facility-administered medications for this visit. (Other)  ? ?REVIEW OF SYSTEMS: ?ROS   ?Positive for: Endocrine, Cardiovascular, Eyes ?Negative for: Constitutional, Gastrointestinal, Neurological, Skin, Genitourinary, Musculoskeletal, HENT, Respiratory, Psychiatric, Allergic/Imm, Heme/Lymph ?Last edited by Theodore Demark, COA on 09/16/2021  7:50 AM.  ?  ? ?ALLERGIES ?Allergies  ?Allergen Reactions  ? Jardiance [Empagliflozin] Other (See Comments)  ?  Frequent yeast infection  ? ?PAST MEDICAL HISTORY ?Past Medical History:  ?Diagnosis Date  ? Adrenal adenoma, left   ? Arthritis   ? Cancer [facility])   ? CKD (chronic kidney disease), stage II   ? Coronary artery disease 07-28-2018 followed by pcp([facility])  currently due to no insurance  ? per cardiac cath 02-06-2015 (positive mild lateral ishcemia on stress test)--- dLAD 80%,  mLAD 40%,  mPDA 80% (small vessel),  ostial D1 70%,  CFx with lumial irregarlities-- medical management  ? Depression   ? Diabetic neuropathy ([place])   ? Diabetic retinopathy ([place])   ? NPDR OU  ? Headache   ? History of Bell's palsy 07/2011  ? per pt residual facial pain on left side occasionally  ? History of cancer of vagina 1999  ? per pt completed radiation and chemo  ? History of sepsis 12/30/2017  ? positive blood culter fro E.coli  ? Hyperlipidemia   ? Hypertension   ? Hypertensive retinopathy   ? OU  ? Hypokalemia   ? Insulin dependent type 2 diabetes mellitus, uncontrolled   ? followed by pcp---  A1c was 11.7 on 06-15-2018 in epic  ? Myocardial infarction [facility])   ? 10 yrs ago?  ? Nocturia   ? Peripheral neuropathy   ? PMB (postmenopausal bleeding)   ? Wears dentures   ? upper  ? Wears glasses   ? ?Past Surgical History:  ?Procedure Laterality Date  ? BREAST BIOPSY  2012  ? benign  ? CARDIAC CATHETERIZATION N/A 02/06/2015  ? Procedure: Left Heart Cath and Coronary Angiography;  Surgeon: Larey Dresser, MD;  Location: [facility];  Service: Cardiovascular;  Laterality: N/A;  ? CATARACT EXTRACTION Bilateral OD: 03/07/19, OS: 02/28/19  ? Dr. Gershon Crane  ? CATARACT EXTRACTION, BILATERAL    ? COLONOSCOPY    ? normal exam in [place], [place] abour 10-12 years ago  ? EYE SURGERY Bilateral OD: 03/07/19, OS: 02/28/19  ? Cat Sx - Dr. Gershon Crane  ? HYSTEROSCOPY WITH D & C N/A 08/01/2018  ? Procedure: DILATATION AND CURETTAGE /HYSTEROSCOPY;  Surgeon: Mora Bellman, MD;  Location: [facility];  Service: Gynecology;  Laterality: N/A;  ? ROBOTIC ASSISTED TOTAL HYSTERECTOMY WITH BILATERAL SALPINGO OOPHERECTOMY N/A 11/28/2018  ? Procedure: XI ROBOTIC ASSISTED TOTAL HYSTERECTOMY WITH BILATERAL SALPINGO OOPHORECTOMY;  Surgeon:  Everitt Amber, MD;  [facility]: [facility];  Service: Gynecology;  Laterality: N/A;  ? SENTINEL NODE BIOPSY N/A 11/28/2018  ? Procedure: SENTINEL NODE BIOPSY;  Surgeon: Everitt Amber, MD;  Location: [facility];  Service: Gynecology;  Laterality: N/A;  ? UMBILICAL HERNIA REPAIR  child  ? ?FAMILY HISTORY ?Family History  ?Problem Relation Age of Onset  ? Heart disease Mother   ? Hypertension Mother   ? Diabetes Mother   ? Cancer Father   ?     hodgkins lymphoma  ? Heart disease Sister   ?     heart attack  ? Diabetes Sister   ? Colon cancer Brother 53  ? Diabetes Maternal Aunt   ? Diabetes Maternal Uncle   ? Cancer Other   ?     parent  ? Diabetes Other   ?     parent  ? Heart disease Other   ?     parent  ? Hyperlipidemia Other   ?     parent  ? Hypertension Other   ?     parent  ? Arthritis Other   ?     parent  ? Breast cancer Neg Hx   ? Colon polyps Neg Hx   ? Esophageal cancer Neg Hx   ? Rectal cancer Neg Hx   ? Stomach cancer Neg Hx   ? ?SOCIAL HISTORY ?Social History  ? ?Tobacco Use  ? Smoking status: Never  ? Smokeless tobacco: Never  ?Vaping Use  ? Vaping Use: Never used  ?Substance Use Topics  ? Alcohol use: Not Currently  ? Drug use: Yes  ?  Types: Marijuana  ?  Comment: every 2 weeks per pt  ?  ? ?  ?OPHTHALMIC  EXAM: ? ?Base Eye Exam   ? ? Visual Acuity (Snellen - Linear)   ? ?   Right Left  ? Dist Sentinel 20/20 20/300 -1  ? Dist ph Obert  20/250 -1  ? ?  ?  ? ? Tonometry (Tonopen, 7:48 AM)   ? ?   Right Left  ? Pressure 15 16  ?

## 2021-09-16 ENCOUNTER — Encounter (INDEPENDENT_AMBULATORY_CARE_PROVIDER_SITE_OTHER): Payer: Self-pay | Admitting: Ophthalmology

## 2021-09-16 ENCOUNTER — Ambulatory Visit (INDEPENDENT_AMBULATORY_CARE_PROVIDER_SITE_OTHER): Payer: Medicare Other | Admitting: Ophthalmology

## 2021-09-16 DIAGNOSIS — H40053 Ocular hypertension, bilateral: Secondary | ICD-10-CM

## 2021-09-16 DIAGNOSIS — H35353 Cystoid macular degeneration, bilateral: Secondary | ICD-10-CM | POA: Diagnosis not present

## 2021-09-16 DIAGNOSIS — I10 Essential (primary) hypertension: Secondary | ICD-10-CM | POA: Diagnosis not present

## 2021-09-16 DIAGNOSIS — Z961 Presence of intraocular lens: Secondary | ICD-10-CM

## 2021-09-16 DIAGNOSIS — H35033 Hypertensive retinopathy, bilateral: Secondary | ICD-10-CM | POA: Diagnosis not present

## 2021-09-16 DIAGNOSIS — E113313 Type 2 diabetes mellitus with moderate nonproliferative diabetic retinopathy with macular edema, bilateral: Secondary | ICD-10-CM

## 2021-09-16 MED ORDER — FARICIMAB-SVOA 6 MG/0.05ML IZ SOLN
6.0000 mg | INTRAVITREAL | Status: AC | PRN
  Administered 2021-09-16: 6 mg via INTRAVITREAL

## 2021-09-16 MED ORDER — AFLIBERCEPT 2MG/0.05ML IZ SOLN FOR KALEIDOSCOPE
2.0000 mg | INTRAVITREAL | Status: AC | PRN
  Administered 2021-09-16: 2 mg via INTRAVITREAL

## 2021-09-25 ENCOUNTER — Ambulatory Visit: Payer: Medicare Other | Attending: Internal Medicine | Admitting: Internal Medicine

## 2021-09-28 ENCOUNTER — Other Ambulatory Visit: Payer: Self-pay

## 2021-09-28 ENCOUNTER — Other Ambulatory Visit: Payer: Self-pay | Admitting: Internal Medicine

## 2021-09-28 ENCOUNTER — Other Ambulatory Visit: Payer: Self-pay | Admitting: Family Medicine

## 2021-09-28 DIAGNOSIS — E1165 Type 2 diabetes mellitus with hyperglycemia: Secondary | ICD-10-CM

## 2021-09-28 DIAGNOSIS — E876 Hypokalemia: Secondary | ICD-10-CM

## 2021-09-28 MED ORDER — LANTUS SOLOSTAR 100 UNIT/ML ~~LOC~~ SOPN
PEN_INJECTOR | SUBCUTANEOUS | 3 refills | Status: DC
  Filled 2021-09-28: qty 15, 26d supply, fill #0
  Filled 2021-11-04: qty 15, 26d supply, fill #1
  Filled 2021-11-25: qty 15, 26d supply, fill #2
  Filled 2021-12-24: qty 15, 26d supply, fill #3

## 2021-09-28 NOTE — Telephone Encounter (Signed)
Will forward to provider. [place] refill was picked up on 09/08/21. Pt had an appt on 4/28. Looks like provider was not able to get in touch with her  ?

## 2021-09-29 ENCOUNTER — Other Ambulatory Visit: Payer: Self-pay

## 2021-09-29 ENCOUNTER — Other Ambulatory Visit: Payer: Self-pay | Admitting: Internal Medicine

## 2021-09-29 DIAGNOSIS — E876 Hypokalemia: Secondary | ICD-10-CM

## 2021-09-29 NOTE — Telephone Encounter (Signed)
Medication Refill - Medication: potassium chloride (KLOR-CON) 10 MEQ tablet ? ?Has the patient contacted their pharmacy? Yes.   Pt told to contact provider ? ?Preferred Pharmacy (with phone number or street name):  ?[id number] [place]:  [phone] ?Fax:  [phone] ?  ? ?Has the patient been seen for an appointment in the last year OR does the patient have an upcoming appointment? Yes.   ? ?Agent: Please be advised that RX refills may take up to 3 business days. We ask that you follow-up with your pharmacy. ? ?

## 2021-09-30 ENCOUNTER — Other Ambulatory Visit: Payer: Self-pay

## 2021-09-30 ENCOUNTER — Other Ambulatory Visit: Payer: Self-pay | Admitting: Internal Medicine

## 2021-09-30 DIAGNOSIS — E876 Hypokalemia: Secondary | ICD-10-CM

## 2021-09-30 MED ORDER — POTASSIUM CHLORIDE ER 10 MEQ PO TBCR: 10.0000 meq | EXTENDED_RELEASE_TABLET | Freq: Every day | ORAL | 1 refills | Status: DC

## 2021-09-30 NOTE — Telephone Encounter (Signed)
Requested Prescriptions  ?Pending Prescriptions Disp Refills  ?? potassium chloride (KLOR-CON) 10 MEQ tablet 90 tablet 1  ?  Sig: Take 1 tablet (10 mEq total) by mouth daily.  ?  ? Endocrinology:  Minerals - Potassium Supplementation Failed - 09/29/2021  3:52 PM  ?  ?  Failed - Cr in normal range and within 360 days  ?  Creatinine, Ser  ?Date Value Ref Range Status  ?01/30/2021 1.14 (H) 0.57 - 1.00 mg/dL Final  ? ?Creatinine,U  ?Date Value Ref Range Status  ?07/15/2015 107.0 mg/dL Final  ? ?Creatinine, Urine  ?Date Value Ref Range Status  ?01/01/2018 52.71 mg/dL Final  ?   ?  ?  Passed - K in normal range and within 360 days  ?  Potassium  ?Date Value Ref Range Status  ?01/30/2021 3.7 3.5 - 5.2 mmol/L Final  ?   ?  ?  Passed - Valid encounter within last 12 months  ?  Recent Outpatient Visits   ?      ? 3 months ago Type 2 diabetes mellitus with morbid obesity ([place])  ? [place] Ladell Pier, MD  ? 4 months ago Subacute vaginitis  ? [place] Ladell Pier, MD  ? 8 months ago Type 2 diabetes mellitus with morbid obesity ([place])  ? [place] Ladell Pier, MD  ? 9 months ago Dysuria  ? [place] Ladell Pier, MD  ? 11 months ago Essential hypertension  ? [place], RPH-CPP  ?  ?  ?Future Appointments   ?        ? In 3 months Ladell Pier, MD [place]  ?  ? ?  ?  ?  ? ? ?

## 2021-10-01 ENCOUNTER — Telehealth: Payer: Self-pay | Admitting: Internal Medicine

## 2021-10-01 ENCOUNTER — Other Ambulatory Visit: Payer: Self-pay

## 2021-10-01 DIAGNOSIS — E876 Hypokalemia: Secondary | ICD-10-CM

## 2021-10-01 NOTE — Telephone Encounter (Signed)
Copied from [place] [id number]. Topic: General - Other ?>> Oct 01, 2021 12:49 PM Pawlus, Brayton Layman A wrote: ?Reason for CRM: Pt wanted to speak to Dr Wynetta Emery regarding potassium chloride, pt stated she thought she was supposed to be taking 2 pills daily, please advise. ?

## 2021-10-05 NOTE — Telephone Encounter (Signed)
Patient was called and she states that she is taking 2 pills daily, she was informed to only take 1 pill a day. She has been scheduled a lab appointment for 10/07/2021 ?

## 2021-10-07 ENCOUNTER — Other Ambulatory Visit: Payer: Medicare Other

## 2021-10-07 ENCOUNTER — Ambulatory Visit: Payer: Medicare Other | Attending: Internal Medicine

## 2021-10-07 DIAGNOSIS — E876 Hypokalemia: Secondary | ICD-10-CM

## 2021-10-08 ENCOUNTER — Telehealth: Payer: Self-pay

## 2021-10-08 DIAGNOSIS — E876 Hypokalemia: Secondary | ICD-10-CM

## 2021-10-08 LAB — POTASSIUM: Potassium: 3.6 mmol/L (ref 3.5–5.2)

## 2021-10-08 MED ORDER — POTASSIUM CHLORIDE ER 10 MEQ PO TBCR: 20.0000 meq | EXTENDED_RELEASE_TABLET | Freq: Every day | ORAL | 5 refills | Status: DC

## 2021-10-08 NOTE — Progress Notes (Signed)
?[facility] Note ? ?10/14/2021 ? ?  ? ?CHIEF COMPLAINT ?Patient presents for Retina Follow Up ? ? ?HISTORY OF PRESENT ILLNESS: ?Kimberly Frazier is a 61 y.o. female who presents to the clinic today for:  ? ?HPI   ? ? Retina Follow Up   ?Patient presents with  Diabetic Retinopathy.  In both eyes.  Severity is moderate.  Duration of 4 weeks.  Since onset it is stable.  I, the attending physician,  performed the HPI with the patient and updated documentation appropriately. ? ?  ?  ? ? Comments   ?Pt here for 4 wk ret f/u NPDR OU. Pt states VA is the same but has noted OU has soreness that comes and goes. Does report taking Refresh several times daily OU. Also reports maintaining PF BID OU, ketorolac QID OU, cosopt and brim BID OU. ? ?  ?  ?Last edited by Bernarda Caffey, MD on 10/14/2021  8:16 AM.  ?  ?Pt states vision is the same, eyes seem sore around the outside when she puts her drops in  ? ?Referring physician: ?Ladell Pier, MD ?[address] ?[address] ?[place],  [place] [address] ? ?HISTORICAL INFORMATION:  ? ?Selected notes from the [facility] ?Referred by Dr. Rutherford Guys for concern of CME ?  ? ?CURRENT MEDICATIONS: ?Current Outpatient Medications (Ophthalmic Drugs)  ?Medication Sig  ? brimonidine (ALPHAGAN) 0.2 % ophthalmic solution Place 1 drop into both eyes 2 (two) times daily.  ? dorzolamide-timolol (COSOPT) 22.3-6.8 MG/ML ophthalmic solution Place 1 drop into both eyes 2 (two) times daily.  ? ketorolac (ACULAR) 0.5 % ophthalmic solution Place 1 drop into both eyes 4 (four) times daily.  ? ketorolac (ACULAR) 0.5 % ophthalmic solution Place 1 drop into both eyes 4 (four) times daily.  ? prednisoLONE acetate (PRED FORTE) 1 % ophthalmic suspension Place 1 drop into both eyes 4 (four) times daily.  ? Bromfenac Sodium (PROLENSA) 0.07 % SOLN Place 1 drop into both eyes 4 (four) times daily. (Patient not taking: Reported on 05/18/2021)  ? prednisoLONE acetate (PRED FORTE) 1 %  ophthalmic suspension Place 1 drop into both eyes 4 (four) times daily. (Patient not taking: Reported on 10/14/2021)  ? ?No current facility-administered medications for this visit. (Ophthalmic Drugs)  ? ?Current Outpatient Medications (Other)  ?Medication Sig  ? Accu-Chek Softclix Lancets lancets Use to check blood sugar 3 times daily.  ? amLODipine (NORVASC) 10 MG tablet TAKE 1 TABLET (10 MG TOTAL) BY MOUTH DAILY.  ? aspirin EC 81 MG tablet Take 1 tablet (81 mg total) daily by mouth.  ? atorvastatin (LIPITOR) 80 MG tablet Take 1 tablet (80 mg total) by mouth daily.  ? Blood Glucose Monitoring Suppl (ACCU-CHEK GUIDE) w/Device KIT 1 kit by Does not apply route in the morning, at noon, and at bedtime. Use to check blood sugar 3 times daily.  ? butalbital-acetaminophen-caffeine (FIORICET) 50-325-40 MG tablet Take 1 tablet by mouth every 6 (six) hours as needed for headache.  ? carvedilol (COREG) 25 MG tablet TAKE 1 TABLET (25 MG TOTAL) BY MOUTH 2 (TWO) TIMES DAILY WITH A MEAL.  ? Dulaglutide (TRULICITY) 5.70 VX/7.9TJ SOPN Inject 0.75 mg into the skin once a week.  ? ezetimibe (ZETIA) 10 MG tablet Take 1 tablet (10 mg total) by mouth daily.  ? fluconazole (DIFLUCAN) 150 MG tablet Take 1 tablet (150 mg total) by mouth daily.  ? glucose blood (ACCU-CHEK GUIDE) test strip Use to  check blood sugar 3 times daily.  ? insulin aspart (NOVOLOG) 100 UNIT/ML injection Inject 28 Units into the skin 3 (three) times daily before meals.  ? insulin glargine (LANTUS SOLOSTAR) 100 UNIT/ML Solostar Pen INJECT 58 UNITS INTO THE SKIN DAILY.  ? Insulin Pen Needle (BD PEN NEEDLE NANO U/F) 32G X 4 MM MISC Use as directed.  ? Insulin Syringe-Needle U-100 (BD INSULIN SYRINGE) 29G X 1/2" 0.3 ML MISC Use to inject Novolog TID.  ? isosorbide mononitrate (IMDUR) 60 MG 24 hr tablet TAKE 1 TABLET (60 MG TOTAL) BY MOUTH DAILY.  ? Na Sulfate-K Sulfate-Mg Sulf 17.5-3.13-1.6 GM/177ML SOLN Suprep (no substitutions)-TAKE AS DIRECTED.  ? nitroGLYCERIN  (NITROSTAT) 0.4 MG SL tablet Place 1 tab under tongue for chest pain.  May repeat after 5 minutes x 2.  DO NOT TAKE MORE THAN 3 TABS DURING AN EPISODE OF CHEST PAIN  ? oxybutynin (DITROPAN-XL) 5 MG 24 hr tablet Take 1 tablet (5 mg total) by mouth at bedtime.  ? potassium chloride (KLOR-CON) 10 MEQ tablet Take 2 tablets (20 mEq total) by mouth daily.  ? ?No current facility-administered medications for this visit. (Other)  ? ?REVIEW OF SYSTEMS: ?ROS   ?Positive for: Endocrine, Cardiovascular, Eyes ?Negative for: Constitutional, Gastrointestinal, Neurological, Skin, Genitourinary, Musculoskeletal, HENT, Respiratory, Psychiatric, Allergic/Imm, Heme/Lymph ?Last edited by Kingsley Spittle, COT on 10/14/2021  7:45 AM.  ?  ? ? ?ALLERGIES ?Allergies  ?Allergen Reactions  ? Jardiance [Empagliflozin] Other (See Comments)  ?  Frequent yeast infection  ? ?PAST MEDICAL HISTORY ?Past Medical History:  ?Diagnosis Date  ? Adrenal adenoma, left   ? Arthritis   ? Cancer [facility])   ? CKD (chronic kidney disease), stage II   ? Coronary artery disease 07-28-2018 followed by pcp([facility])  currently due to no insurance  ? per cardiac cath 02-06-2015 (positive mild lateral ishcemia on stress test)--- dLAD 80%,  mLAD 40%,  mPDA 80% (small vessel),  ostial D1 70%,  CFx with lumial irregarlities-- medical management  ? Depression   ? Diabetic neuropathy ([place])   ? Diabetic retinopathy ([place])   ? NPDR OU  ? Headache   ? History of Bell's palsy 07/2011  ? per pt residual facial pain on left side occasionally  ? History of cancer of vagina 1999  ? per pt completed radiation and chemo  ? History of sepsis 12/30/2017  ? positive blood culter fro E.coli  ? Hyperlipidemia   ? Hypertension   ? Hypertensive retinopathy   ? OU  ? Hypokalemia   ? Insulin dependent type 2 diabetes mellitus, uncontrolled   ? followed by pcp---  A1c was 11.7 on 06-15-2018 in epic  ? Myocardial infarction [facility])   ? 10 yrs ago?  ? Nocturia   ? Peripheral  neuropathy   ? PMB (postmenopausal bleeding)   ? Wears dentures   ? upper  ? Wears glasses   ? ?Past Surgical History:  ?Procedure Laterality Date  ? BREAST BIOPSY  2012  ? benign  ? CARDIAC CATHETERIZATION N/A 02/06/2015  ? Procedure: Left Heart Cath and Coronary Angiography;  Surgeon: Larey Dresser, MD;  Location: [facility];  Service: Cardiovascular;  Laterality: N/A;  ? CATARACT EXTRACTION Bilateral OD: 03/07/19, OS: 02/28/19  ? Dr. Gershon Crane  ? CATARACT EXTRACTION, BILATERAL    ? COLONOSCOPY    ? normal exam in [place], [place] abour 10-12 years ago  ? EYE SURGERY Bilateral OD: 03/07/19, OS: 02/28/19  ? Cat Sx -  Dr. Gershon Crane  ? HYSTEROSCOPY WITH D & C N/A 08/01/2018  ? Procedure: DILATATION AND CURETTAGE /HYSTEROSCOPY;  Surgeon: Mora Bellman, MD;  Location: [facility] [place] [facility];  Service: Gynecology;  Laterality: N/A;  ? ROBOTIC ASSISTED TOTAL HYSTERECTOMY WITH BILATERAL SALPINGO OOPHERECTOMY N/A 11/28/2018  ? Procedure: XI ROBOTIC ASSISTED TOTAL HYSTERECTOMY WITH BILATERAL SALPINGO OOPHORECTOMY;  Surgeon: Everitt Amber, MD;  [facility];  Service: Gynecology;  Laterality: N/A;  ? SENTINEL NODE BIOPSY N/A 11/28/2018  ? Procedure: SENTINEL NODE BIOPSY;  Surgeon: Everitt Amber, MD;  [facility];  Service: Gynecology;  Laterality: N/A;  ? UMBILICAL HERNIA REPAIR  child  ? ?FAMILY HISTORY ?Family History  ?Problem Relation Age of Onset  ? Heart disease Mother   ? Hypertension Mother   ? Diabetes Mother   ? Cancer Father   ?     hodgkins lymphoma  ? Heart disease Sister   ?     heart attack  ? Diabetes Sister   ? Colon cancer Brother 49  ? Diabetes Maternal Aunt   ? Diabetes Maternal Uncle   ? Cancer Other   ?     parent  ? Diabetes Other   ?     parent  ? Heart disease Other   ?     parent  ? Hyperlipidemia Other   ?     parent  ? Hypertension Other   ?     parent  ? Arthritis Other   ?     parent  ? Breast cancer Neg Hx   ? Colon polyps Neg Hx   ? Esophageal cancer Neg Hx   ? Rectal cancer Neg Hx    ? Stomach cancer Neg Hx   ? ?SOCIAL HISTORY ?Social History  ? ?Tobacco Use  ? Smoking status: Never  ? Smokeless tobacco: Never  ?Vaping Use  ? Vaping Use: Never used  ?Substance Use Topics  ? Alcohol use: Not C

## 2021-10-08 NOTE — Telephone Encounter (Signed)
Contacted pt to go over lab results pt states she is taking 2 tablets in the morning  ?

## 2021-10-08 NOTE — Telephone Encounter (Signed)
Contacted pt to go over provider response pt states she understands and doesn't have any questions or concerns  ?

## 2021-10-14 ENCOUNTER — Encounter (INDEPENDENT_AMBULATORY_CARE_PROVIDER_SITE_OTHER): Payer: Self-pay | Admitting: Ophthalmology

## 2021-10-14 ENCOUNTER — Ambulatory Visit (INDEPENDENT_AMBULATORY_CARE_PROVIDER_SITE_OTHER): Payer: Medicare Other | Admitting: Ophthalmology

## 2021-10-14 DIAGNOSIS — H35033 Hypertensive retinopathy, bilateral: Secondary | ICD-10-CM

## 2021-10-14 DIAGNOSIS — I10 Essential (primary) hypertension: Secondary | ICD-10-CM

## 2021-10-14 DIAGNOSIS — H35353 Cystoid macular degeneration, bilateral: Secondary | ICD-10-CM | POA: Diagnosis not present

## 2021-10-14 DIAGNOSIS — E113313 Type 2 diabetes mellitus with moderate nonproliferative diabetic retinopathy with macular edema, bilateral: Secondary | ICD-10-CM | POA: Diagnosis not present

## 2021-10-14 DIAGNOSIS — H40053 Ocular hypertension, bilateral: Secondary | ICD-10-CM

## 2021-10-14 DIAGNOSIS — Z961 Presence of intraocular lens: Secondary | ICD-10-CM

## 2021-10-14 MED ORDER — FARICIMAB-SVOA 6 MG/0.05ML IZ SOLN
6.0000 mg | INTRAVITREAL | Status: AC | PRN
  Administered 2021-10-14: 6 mg via INTRAVITREAL

## 2021-10-14 MED ORDER — AFLIBERCEPT 2MG/0.05ML IZ SOLN FOR KALEIDOSCOPE
2.0000 mg | INTRAVITREAL | Status: AC | PRN
  Administered 2021-10-14: 2 mg via INTRAVITREAL

## 2021-10-15 ENCOUNTER — Ambulatory Visit: Payer: Self-pay

## 2021-10-15 NOTE — Telephone Encounter (Signed)
Pt called requesting to speak to PCP about an antibiotic. Wanted to leave voicemail, does not want to disclose any further information. Wants to speak to clinic today   Best contact: [phone] or [phone]     Chief Complaint: Urinary urgency,pain, burning, back pain Symptoms: Above Frequency: 2 weeks ago Pertinent Negatives: Patient denies  Disposition: '[]'$ ED /'[]'$ Urgent Care (no appt availability in office) / '[]'$ Appointment(In office/virtual)/ '[]'$  [facility]/ '[]'$ Home Care/ '[]'$ Refused Recommended Disposition /'[]'$ [facility]/ '[x]'$  Follow-up with PCP Additional Notes: Pt. Asking for antibiotics. Instructed she would need to be seen. Please advise pt.   Answer Assessment - Initial Assessment Questions 1. SYMPTOM: "What's the main symptom you're concerned about?" (e.g., frequency, incontinence)     Urgency 2. ONSET: "When did the  symptom  start?"     2 weeks ago 3. PAIN: "Is there any pain?" If Yes, ask: "How bad is it?" (Scale: 1-10; mild, moderate, severe)     Back pain 4. CAUSE: "What do you think is causing the symptoms?"     UTI 5. OTHER SYMPTOMS: "Do you have any other symptoms?" (e.g., fever, flank pain, blood in urine, pain with urination)     Pain, burning 6. PREGNANCY: "Is there any chance you are pregnant?" "When was your last menstrual period?"     No  Protocols used: Urinary Symptoms-A-AH

## 2021-10-19 ENCOUNTER — Other Ambulatory Visit: Payer: Self-pay

## 2021-10-20 ENCOUNTER — Ambulatory Visit: Payer: Medicare Other

## 2021-10-20 ENCOUNTER — Telehealth: Payer: Self-pay | Admitting: Internal Medicine

## 2021-10-20 NOTE — Telephone Encounter (Signed)
Patient has been called and informed to come in office for a nurse visit to drop off urine sample. Patient agreed and will be in office on 10/22/2021

## 2021-10-20 NOTE — Telephone Encounter (Signed)
Copied from [place] [phone]. Topic: General - Inquiry >> Oct 19, 2021  3:30 PM Oneta Rack wrote: Reason for CRM: patient calling checking on the status of 5/18 Nurse Triage message. >> Oct 19, 2021  4:19 PM Oneta Rack wrote: Patient would like a follow up call as soon as possible

## 2021-10-22 ENCOUNTER — Other Ambulatory Visit: Payer: Self-pay

## 2021-10-22 ENCOUNTER — Ambulatory Visit (INDEPENDENT_AMBULATORY_CARE_PROVIDER_SITE_OTHER): Payer: Medicare Other

## 2021-10-22 ENCOUNTER — Ambulatory Visit: Payer: Medicare Other | Attending: Internal Medicine

## 2021-10-22 ENCOUNTER — Telehealth: Payer: Self-pay

## 2021-10-22 ENCOUNTER — Other Ambulatory Visit: Payer: Self-pay | Admitting: Internal Medicine

## 2021-10-22 ENCOUNTER — Other Ambulatory Visit (INDEPENDENT_AMBULATORY_CARE_PROVIDER_SITE_OTHER): Payer: Self-pay | Admitting: Ophthalmology

## 2021-10-22 DIAGNOSIS — Z0000 Encounter for general adult medical examination without abnormal findings: Secondary | ICD-10-CM | POA: Diagnosis not present

## 2021-10-22 DIAGNOSIS — R399 Unspecified symptoms and signs involving the genitourinary system: Secondary | ICD-10-CM | POA: Insufficient documentation

## 2021-10-22 LAB — POCT URINALYSIS DIP (CLINITEK)
Bilirubin, UA: NEGATIVE
Glucose, UA: NEGATIVE mg/dL
Ketones, POC UA: NEGATIVE mg/dL
Nitrite, UA: POSITIVE — AB
POC PROTEIN,UA: 30 — AB
Spec Grav, UA: 1.025 (ref 1.010–1.025)
Urobilinogen, UA: 0.2 E.U./dL
pH, UA: 6 (ref 5.0–8.0)

## 2021-10-22 MED ORDER — CIPROFLOXACIN HCL 500 MG PO TABS
500.0000 mg | ORAL_TABLET | Freq: Two times a day (BID) | ORAL | 0 refills | Status: DC
  Filled 2021-10-22: qty 14, 7d supply, fill #0

## 2021-10-22 MED ORDER — TRULICITY 0.75 MG/0.5ML ~~LOC~~ SOAJ
0.7500 mg | SUBCUTANEOUS | 2 refills | Status: DC
  Filled 2021-10-22 – 2021-10-27 (×2): qty 2, 28d supply, fill #0
  Filled 2021-11-25: qty 2, 28d supply, fill #1
  Filled 2022-01-18: qty 2, 28d supply, fill #2

## 2021-10-22 MED ORDER — DORZOLAMIDE HCL-TIMOLOL MAL 2-0.5 % OP SOLN
1.0000 [drp] | Freq: Two times a day (BID) | OPHTHALMIC | 1 refills | Status: DC
  Filled 2021-10-22: qty 10, 40d supply, fill #0
  Filled 2022-03-03 – 2022-03-17 (×2): qty 10, 40d supply, fill #1

## 2021-10-22 NOTE — Progress Notes (Signed)
Pt arrived to give urine sample Pt informed that she will be called with the results.

## 2021-10-22 NOTE — Telephone Encounter (Signed)
Patient came in for urine test. Patient had UTI symptoms. POCT urine is in chart.

## 2021-10-22 NOTE — Progress Notes (Addendum)
Subjective:   Kimberly Frazier is a 61 y.o. female who presents for Medicare Annual (Subsequent) preventive examination. I discussed the limitations of evaluation and management by telemedicine and the availability of in person appointments. The patient expressed understanding and agreed to proceed.   Visit performed by audio   Patient location: Home  Provider location: Home  Review of Systems    N/A       Objective:    There were no vitals filed for this visit. There is no height or weight on file to calculate BMI.     10/22/2021   11:55 AM 09/16/2020   10:35 AM 06/19/2019    2:45 PM 11/29/2018    8:00 AM 11/28/2018    3:49 PM 11/24/2018   11:50 AM 10/20/2018   12:37 PM  Advanced Directives  Does Patient Have a Medical Advance Directive? _0  No No  Does patient want to make changes to medical advance directive?  Yes (Inpatient - patient defers changing a medical advance directive at this time - Information given)       Would patient like information on creating a medical advance directive?  Yes (Inpatient - patient defers creating a medical advance directive at this time - Information given) Yes (MAU/Ambulatory/Procedural Areas - Information given) No - Patient declined   Yes (MAU/Ambulatory/Procedural Areas - Information given)    Current Medications (verified) Outpatient Encounter Medications as of 10/22/2021  Medication Sig   Accu-Chek Softclix Lancets lancets Use to check blood sugar 3 times daily.   amLODipine (NORVASC) 10 MG tablet TAKE 1 TABLET (10 MG TOTAL) BY MOUTH DAILY.   aspirin EC 81 MG tablet Take 1 tablet (81 mg total) daily by mouth.   atorvastatin (LIPITOR) 80 MG tablet Take 1 tablet (80 mg total) by mouth daily.   Blood Glucose Monitoring Suppl (ACCU-CHEK GUIDE) w/Device KIT 1 kit by Does not apply route in the morning, at noon, and at bedtime. Use to check blood sugar 3 times daily.   brimonidine (ALPHAGAN) 0.2 % ophthalmic solution Place 1 drop into  both eyes 2 (two) times daily.   carvedilol (COREG) 25 MG tablet TAKE 1 TABLET (25 MG TOTAL) BY MOUTH 2 (TWO) TIMES DAILY WITH A MEAL.   dorzolamide-timolol (COSOPT) 22.3-6.8 MG/ML ophthalmic solution Place 1 drop into both eyes 2 (two) times daily.   Dulaglutide (TRULICITY) 7.12 WP/8.0DX SOPN Inject 0.75 mg into the skin once a week.   ezetimibe (ZETIA) 10 MG tablet Take 1 tablet (10 mg total) by mouth daily.   glucose blood (ACCU-CHEK GUIDE) test strip Use to check blood sugar 3 times daily.   insulin aspart (NOVOLOG) 100 UNIT/ML injection Inject 28 Units into the skin 3 (three) times daily before meals.   insulin glargine (LANTUS SOLOSTAR) 100 UNIT/ML Solostar Pen INJECT 58 UNITS INTO THE SKIN DAILY.   Insulin Pen Needle (BD PEN NEEDLE NANO U/F) 32G X 4 MM MISC Use as directed.   Insulin Syringe-Needle U-100 (BD INSULIN SYRINGE) 29G X 1/2" 0.3 ML MISC Use to inject Novolog TID.   isosorbide mononitrate (IMDUR) 60 MG 24 hr tablet TAKE 1 TABLET (60 MG TOTAL) BY MOUTH DAILY.   ketorolac (ACULAR) 0.5 % ophthalmic solution Place 1 drop into both eyes 4 (four) times daily.   Na Sulfate-K Sulfate-Mg Sulf 17.5-3.13-1.6 GM/177ML SOLN Suprep (no substitutions)-TAKE AS DIRECTED.   nitroGLYCERIN (NITROSTAT) 0.4 MG SL tablet Place 1 tab under tongue for chest pain.  May repeat after 5 minutes x  2.  DO NOT TAKE MORE THAN 3 TABS DURING AN EPISODE OF CHEST PAIN   potassium chloride (KLOR-CON) 10 MEQ tablet Take 2 tablets (20 mEq total) by mouth daily.   prednisoLONE acetate (PRED FORTE) 1 % ophthalmic suspension Place 1 drop into both eyes 4 (four) times daily.   [DISCONTINUED] Bromfenac Sodium (PROLENSA) 0.07 % SOLN Place 1 drop into both eyes 4 (four) times daily. (Patient not taking: Reported on 05/18/2021)   [DISCONTINUED] butalbital-acetaminophen-caffeine (FIORICET) 50-325-40 MG tablet Take 1 tablet by mouth every 6 (six) hours as needed for headache. (Patient not taking: Reported on 10/22/2021)    [DISCONTINUED] fluconazole (DIFLUCAN) 150 MG tablet Take 1 tablet (150 mg total) by mouth daily. (Patient not taking: Reported on 10/22/2021)   [DISCONTINUED] ketorolac (ACULAR) 0.5 % ophthalmic solution Place 1 drop into both eyes 4 (four) times daily. (Patient not taking: Reported on 10/22/2021)   [DISCONTINUED] oxybutynin (DITROPAN-XL) 5 MG 24 hr tablet Take 1 tablet (5 mg total) by mouth at bedtime. (Patient not taking: Reported on 10/22/2021)   [DISCONTINUED] prednisoLONE acetate (PRED FORTE) 1 % ophthalmic suspension Place 1 drop into both eyes 4 (four) times daily. (Patient not taking: Reported on 10/14/2021)   No facility-administered encounter medications on file as of 10/22/2021.    Allergies (verified) Jardiance [empagliflozin]   History: Past Medical History:  Diagnosis Date   Adrenal adenoma, left    Arthritis    Cancer ([place])    CKD (chronic kidney disease), stage II    Coronary artery disease 07-28-2018 followed [facility]  currently due to no insurance   per cardiac cath 02-06-2015 (positive mild lateral ishcemia on stress test)--- dLAD 80%,  mLAD 40%,  mPDA 80% (small vessel),  ostial D1 70%,  CFx with lumial irregarlities-- medical management   Depression    Diabetic neuropathy ([place])    Diabetic retinopathy ([place])    NPDR OU   Headache    History of Bell's palsy 07/2011   per pt residual facial pain on left side occasionally   History of cancer of vagina 1999   per pt completed radiation and chemo   History of sepsis 12/30/2017   positive blood culter fro E.coli   Hyperlipidemia    Hypertension    Hypertensive retinopathy    OU   Hypokalemia    Insulin dependent type 2 diabetes mellitus, uncontrolled    followed by pcp---  A1c was 11.7 on 06-15-2018 in epic   Myocardial infarction [facility])    10 yrs ago?   Nocturia    Peripheral neuropathy    PMB (postmenopausal bleeding)    Wears dentures    upper   Wears glasses    Past Surgical History:   Procedure Laterality Date   BREAST BIOPSY  2012   benign   CARDIAC CATHETERIZATION N/A 02/06/2015   Procedure: Left Heart Cath and Coronary Angiography;  Surgeon: Larey Dresser, MD;  Location: [facility];  Service: Cardiovascular;  Laterality: N/A;   CATARACT EXTRACTION Bilateral OD: 03/07/19, OS: 02/28/19   Dr. Gershon Crane   CATARACT EXTRACTION, BILATERAL     COLONOSCOPY     normal exam in [place], [place] 10-12 years ago   EYE SURGERY Bilateral OD: 03/07/19, OS: 02/28/19   Cat Sx - Dr. Gershon Crane   HYSTEROSCOPY WITH D & C N/A 08/01/2018   Procedure: DILATATION AND CURETTAGE /HYSTEROSCOPY;  Surgeon: Mora Bellman, MD;  Location: [facility];  Service: Gynecology;  Laterality: N/A;  ROBOTIC ASSISTED TOTAL HYSTERECTOMY WITH BILATERAL SALPINGO OOPHERECTOMY N/A 11/28/2018   Procedure: XI ROBOTIC ASSISTED TOTAL HYSTERECTOMY WITH BILATERAL SALPINGO OOPHORECTOMY;  Surgeon: Everitt Amber, MD;  Location: [facility];  Service: Gynecology;  Laterality: N/A;   SENTINEL NODE BIOPSY N/A 11/28/2018   Procedure: SENTINEL NODE BIOPSY;  Surgeon: Everitt Amber, MD;  Location: [facility];  Service: Gynecology;  Laterality: N/A;   UMBILICAL HERNIA REPAIR  child   Family History  Problem Relation Age of Onset   Heart disease Mother    Hypertension Mother    Diabetes Mother    Cancer Father        hodgkins lymphoma   Heart disease Sister        heart attack   Diabetes Sister    Colon cancer Brother 51   Diabetes Maternal Aunt    Diabetes Maternal Uncle    Cancer Other        parent   Diabetes Other        parent   Heart disease Other        parent   Hyperlipidemia Other        parent   Hypertension Other        parent   Arthritis Other        parent   Breast cancer Neg Hx    Colon polyps Neg Hx    Esophageal cancer Neg Hx    Rectal cancer Neg Hx    Stomach cancer Neg Hx    Social History   Socioeconomic History   Marital status: Single    Spouse name: Not on file   Number  of children: 2   Years of education: 12   Highest education level: GED or equivalent  Occupational History   Occupation: retired  Tobacco Use   Smoking status: Never   Smokeless tobacco: Never  Vaping Use   Vaping Use: Never used  Substance and Sexual Activity   Alcohol use: Not Currently   Drug use: Not Currently    Types: Marijuana    Comment: every 2 weeks per pt   Sexual activity: Not Currently  Other Topics Concern   Not on file  Social History Narrative   Lives at home with her grandson.   Right-handed.   No daily caffeine use.   Social Determinants of Health   Financial Resource Strain: Low Risk    Difficulty of Paying Living Expenses: Not very hard  Food Insecurity: Food Insecurity Present   Worried About [place] in the Last Year: Sometimes true   Ran Out of Food in the Last Year: Sometimes true  Transportation Needs: No Transportation Needs   Lack of Transportation (Medical): No   Lack of Transportation (Non-Medical): No  Physical Activity: Inactive   Days of Exercise per Week: 0 days   Minutes of Exercise per Session: 0 min  Stress: No Stress Concern Present   Feeling of Stress : Only a little  Social Connections: Socially Isolated   Frequency of Communication with Friends and Family: More than three times a week   Frequency of Social Gatherings with Friends and Family: Twice a week   Attends Religious Services: Never   Marine scientist or Organizations: No   Attends Music therapist: Never   Marital Status: Never married    Tobacco Counseling Counseling given: Not Answered   Clinical Intake:  Pre-visit preparation completed: Yes  Pain : No/denies pain        How  often do you need to have someone help you when you read instructions, pamphlets, or other written materials from your doctor or pharmacy?: 1 - Never What is the last grade level you completed in school?: GED  Diabetic?Yes  Interpreter Needed?:  No  Information entered by :: Anson [place] CMA   Activities of Daily Living    10/22/2021   11:57 AM  In your present state of health, do you have any difficulty performing the following activities:  Hearing? 1  Comment Hearing loss in both ears  Vision? 1  Difficulty concentrating or making decisions? 0  Walking or climbing stairs? 1  Dressing or bathing? 0  Doing errands, shopping? 0  Preparing Food and eating ? N  Using the Toilet? N  In the past six months, have you accidently leaked urine? Y  Do you have problems with loss of bowel control? Y  Managing your Medications? N  Managing your Finances? N  Housekeeping or managing your Housekeeping? N    Patient Care Team: Ladell Pier, MD as PCP - General (Internal Medicine) Buford Dresser, MD as PCP - Cardiology (Cardiology)  Indicate any recent Medical Services you may have received from other than [facility] providers in the past year (date may be approximate).     Assessment:   This is a routine wellness examination for [place].  Hearing/Vision screen No results found.  Dietary issues and exercise activities discussed: Current Exercise Habits: The patient does not participate in regular exercise at present   Goals Addressed   None    Depression Screen    10/22/2021   11:47 AM 06/30/2021    3:59 PM 01/30/2021    2:31 PM 09/16/2020   10:34 AM 07/29/2020    3:14 PM 03/28/2020   10:47 AM 11/12/2019   10:52 AM  PHQ 2/9 Scores  PHQ - 2 Score _0 PHQ- 9 Score  _1 Fall Risk    10/22/2021   11:56 AM 06/30/2021    3:59 PM 01/30/2021    2:31 PM 09/16/2020   10:34 AM 07/29/2020    3:14 PM  Fall Risk   Falls in the past year? 1 0 1 0 0  Number falls in past yr: 0 0 0 0 0  Injury with Fall? 0 0 0 0 0  Risk for fall due to : History of fall(s) No Fall Risks Impaired balance/gait    Follow up Falls evaluation completed        FALL RISK PREVENTION PERTAINING TO THE HOME:  Any stairs in  or around the home? Yes  If so, are there any without handrails? No  Home free of loose throw rugs in walkways, pet beds, electrical cords, etc? Yes  Adequate lighting in your home to reduce risk of falls? Yes   ASSISTIVE DEVICES UTILIZED TO PREVENT FALLS:  Life alert? No  Use of a cane, walker or w/c? No  Grab bars in the bathroom? No  Shower chair or bench in shower? No  Elevated toilet seat or a handicapped toilet? No  TIMED UP AND GO:  Was the test performed? No .  Length of time to ambulate 10 feet: 0 sec.     Cognitive Function:    09/16/2020   10:34 AM  MMSE - Mini Mental State Exam  Orientation to time 5  Orientation to Place 5  Registration 3  Attention/ Calculation 5  Recall 3  Language- name 2 objects 2  Language- repeat 1  Language- follow 3 step command 3  Language- read & follow direction 1  Write a sentence 1  Copy design 0  Total score 29        10/22/2021   11:59 AM  6CIT Screen  What Year? 0 points  What month? 0 points  What time? 0 points  Count back from 20 0 points  Months in reverse 0 points  Repeat phrase 0 points  Total Score 0 points    Immunizations Immunization History  Administered Date(s) Administered   Influenza,inj,Quad PF,6+ Mos 03/28/2020   Influenza-Unspecified 03/31/2012   PFIZER(Purple Top)SARS-COV-2 Vaccination 01/22/2020, 02/21/2020   Pneumococcal Polysaccharide-23 07/26/2012   Tdap 07/26/2012   Zoster Recombinat (Shingrix) 09/16/2020    TDAP status: Up to date  Flu Vaccine status: Due, Education has been provided regarding the importance of this vaccine. Advised may receive this vaccine at local pharmacy or Health Dept. Aware to provide a copy of the vaccination record if obtained from local pharmacy or Health Dept. Verbalized acceptance and understanding.  Pneumococcal vaccine status: Due, Education has been provided regarding the importance of this vaccine. Advised may receive this vaccine at local pharmacy or  Health Dept. Aware to provide a copy of the vaccination record if obtained from local pharmacy or Health Dept. Verbalized acceptance and understanding.  Covid-19 vaccine status: Information provided on how to obtain vaccines.   Qualifies for Shingles Vaccine? Yes   Zostavax completed No   Shingrix Completed?: No.    Education has been provided regarding the importance of this vaccine. Patient has been advised to call insurance company to determine out of pocket expense if they have not yet received this vaccine. Advised may also receive vaccine at local pharmacy or Health Dept. Verbalized acceptance and understanding.  Screening Tests Health Maintenance  Topic Date Due   COVID-19 Vaccine (3 - Pfizer risk series) 03/20/2020   Zoster Vaccines- Shingrix (2 of 2) 11/11/2020   FOOT EXAM  03/28/2021   PAP SMEAR-Modifier  06/15/2021   HEMOGLOBIN A1C  12/28/2021   INFLUENZA VACCINE  12/29/2021   URINE MICROALBUMIN  06/30/2022   TETANUS/TDAP  07/26/2022   OPHTHALMOLOGY EXAM  09/17/2022   MAMMOGRAM  11/18/2022   Hepatitis C Screening  Completed   HIV Screening  Completed   HPV VACCINES  Aged Out   Fecal DNA (Cologuard)  Discontinued    Health Maintenance  Health Maintenance Due  Topic Date Due   COVID-19 Vaccine (3 - Pfizer risk series) 03/20/2020   Zoster Vaccines- Shingrix (2 of 2) 11/11/2020   FOOT EXAM  03/28/2021   PAP SMEAR-Modifier  06/15/2021    Colorectal cancer screening: Type of screening: Colonoscopy. Completed Yes. Repeat every 3 years  Mammogram status: Completed yes. Repeat every year    Lung Cancer Screening: (Low Dose CT Chest recommended if Age 37-80 years, 30 pack-year currently smoking OR have quit w/in 15years.) does not qualify.   Lung Cancer Screening Referral: No  Additional Screening:  Hepatitis C Screening: does not qualify; Completed No  Vision Screening: Recommended annual ophthalmology exams for early detection of glaucoma and other disorders of  the eye. Is the patient up to date with their annual eye exam?  Yes  Who is the provider or what is the name of the office in which the patient attends annual eye exams? [facility] in [place] If pt is not established with a provider, would they like to be referred to a  provider to establish care? No .   Dental Screening: Recommended annual dental exams for proper oral hygiene  Community Resource Referral / Chronic Care Management: CRR required this visit?  No   CCM required this visit?  No      Plan:     I have personally reviewed and noted the following in the patient's chart:   Medical and social history Use of alcohol, tobacco or illicit drugs  Current medications and supplements including opioid prescriptions.  Functional ability and status Nutritional status Physical activity Advanced directives List of other physicians Hospitalizations, surgeries, and ER visits in previous 12 months Vitals Screenings to include cognitive, depression, and falls Referrals and appointments  In addition, I have reviewed and discussed with patient certain preventive protocols, quality metrics, and best practice recommendations. A written personalized care plan for preventive services as well as general preventive health recommendations were provided to patient.    Ms. Krogstad , Thank you for taking time to come for your Medicare Wellness Visit. I appreciate your ongoing commitment to your health goals. Please review the following plan we discussed and let me know if I can assist you in the future.   These are the goals we discussed:  Goals      Weight (lb) < 200 lb (90.7 kg)     "I would like to lose about 40 lbs by the end of the year."          This is a list of the screening recommended for you and due dates:  Health Maintenance  Topic Date Due   COVID-19 Vaccine (3 - Pfizer risk series) 03/20/2020   Zoster (Shingles) Vaccine (2 of 2) 11/11/2020   Complete foot exam    03/28/2021   Pap Smear  06/15/2021   Hemoglobin A1C  12/28/2021   Flu Shot  12/29/2021   Urine Protein Check  06/30/2022   Tetanus Vaccine  07/26/2022   Eye exam for diabetics  09/17/2022   Mammogram  11/18/2022   Hepatitis C Screening: USPSTF Recommendation to screen - Ages 18-79 yo.  Completed   HIV Screening  Completed   HPV Vaccine  Aged Out   Cologuard (Stool DNA test)  Discontinued     Renato Gails, [place]   10/22/2021   Nurse Notes: Patient would like to have a Podiatry referral for pain in her big toe nail on the right foot. Patient states she used to use [place] and would like to go back to that office. Also patient is requesting a referral for ENT for a routine hearing test for hearing loss. Ms. Bohan would also like to inquire about getting a shower chair and elevated toilet seat, because she has difficulty getting up and down at times. Patient was informed of her current care gaps and how to obtain the needed vaccines. Pap Smear has been taken off the care gaps due to patient had full Hysterectomy 11/28/2018. Procedure information is in chart and has been documented. Mammogram will be due in June 2023 and patient has received a letter to schedule.   I have reviewed and agreed with the above documentation. Theresia Lo, NP

## 2021-10-23 ENCOUNTER — Other Ambulatory Visit (INDEPENDENT_AMBULATORY_CARE_PROVIDER_SITE_OTHER): Payer: Self-pay | Admitting: Ophthalmology

## 2021-10-23 ENCOUNTER — Other Ambulatory Visit: Payer: Self-pay

## 2021-10-23 ENCOUNTER — Other Ambulatory Visit: Payer: Self-pay | Admitting: Internal Medicine

## 2021-10-23 MED ORDER — PREDNISOLONE ACETATE 1 % OP SUSP
1.0000 [drp] | Freq: Four times a day (QID) | OPHTHALMIC | 4 refills | Status: DC
Start: 2021-10-23 — End: 2022-04-13
  Filled 2021-10-23: qty 15, 30d supply, fill #0

## 2021-10-23 MED ORDER — "BD VEO INSULIN SYRINGE U/F 31G X 15/64"" 0.3 ML MISC"
2 refills | Status: AC
  Filled 2021-10-23: qty 100, 33d supply, fill #0
  Filled 2022-01-20: qty 100, 33d supply, fill #1
  Filled 2022-03-17: qty 100, 33d supply, fill #2

## 2021-10-23 NOTE — Telephone Encounter (Signed)
Patient was called and a VM was left informing patient to return phone call. 

## 2021-10-27 ENCOUNTER — Other Ambulatory Visit: Payer: Self-pay

## 2021-10-28 ENCOUNTER — Other Ambulatory Visit: Payer: Self-pay

## 2021-11-04 ENCOUNTER — Other Ambulatory Visit: Payer: Self-pay

## 2021-11-04 NOTE — Progress Notes (Signed)
[facility] Note  11/11/2021     CHIEF COMPLAINT Patient presents for Retina Follow Up   HISTORY OF PRESENT ILLNESS: Kimberly Frazier is a 61 y.o. female who presents to the clinic today for:   HPI     Retina Follow Up   Patient presents with  Diabetic Retinopathy (IVE OD #13 and IV Vabysmo OS #7 (05.17.23)).  In both eyes.  This started months ago.  Severity is moderate.  Duration of 4 weeks.  Since onset it is stable.  I, the attending physician,  performed the HPI with the patient and updated documentation appropriately.        Comments   Patient feels that the vision has been stable since her last appointment. She sees flashes and floaters. Her blood sugar was 126 and her A1C she was told was pretty high but forgot the number. She is using Ketorolac OU QID and Brimonidine OU BID      Last edited by Bernarda Caffey, MD on 11/11/2021  8:09 AM.    Patient has not been feeling well so she has not been using her eye drops.  States she will restart today.   Referring physician: Ladell Pier, MD [place],  [place] [id number]  HISTORICAL INFORMATION:   Selected notes from the MEDICAL RECORD NUMBER Referred by Dr. Rutherford Guys for concern of CME    CURRENT MEDICATIONS: Current Outpatient Medications (Ophthalmic Drugs)  Medication Sig   brimonidine (ALPHAGAN) 0.2 % ophthalmic solution Place 1 drop into both eyes 2 (two) times daily.   dorzolamide-timolol (COSOPT) 22.3-6.8 MG/ML ophthalmic solution Place 1 drop into both eyes 2 (two) times daily.   ketorolac (ACULAR) 0.5 % ophthalmic solution Place 1 drop into both eyes 4 (four) times daily.   prednisoLONE acetate (PRED FORTE) 1 % ophthalmic suspension Place 1 drop into both eyes 4 (four) times daily.   No current facility-administered medications for this visit. (Ophthalmic Drugs)   Current Outpatient Medications (Other)  Medication Sig   Accu-Chek Softclix Lancets lancets  Use to check blood sugar 3 times daily.   amLODipine (NORVASC) 10 MG tablet TAKE 1 TABLET (10 MG TOTAL) BY MOUTH DAILY.   aspirin EC 81 MG tablet Take 1 tablet (81 mg total) daily by mouth.   Blood Glucose Monitoring Suppl (ACCU-CHEK GUIDE) w/Device KIT 1 kit by Does not apply route in the morning, at noon, and at bedtime. Use to check blood sugar 3 times daily.   carvedilol (COREG) 25 MG tablet TAKE 1 TABLET (25 MG TOTAL) BY MOUTH 2 (TWO) TIMES DAILY WITH A MEAL.   ciprofloxacin (CIPRO) 500 MG tablet Take 1 tablet (500 mg total) by mouth 2 (two) times daily.   Dulaglutide (TRULICITY) 2.69 SW/5.4OE SOPN Inject 0.75 mg into the skin once a week.   ezetimibe (ZETIA) 10 MG tablet Take 1 tablet (10 mg total) by mouth daily.   glucose blood (ACCU-CHEK GUIDE) test strip Use to check blood sugar 3 times daily.   insulin aspart (NOVOLOG) 100 UNIT/ML injection Inject 28 Units into the skin 3 (three) times daily before meals.   insulin glargine (LANTUS SOLOSTAR) 100 UNIT/ML Solostar Pen INJECT 58 UNITS INTO THE SKIN DAILY.   Insulin Pen Needle (BD PEN NEEDLE NANO U/F) 32G X 4 MM MISC Use as directed.   Insulin Syringe-Needle U-100 (BD VEO INSULIN SYRINGE U/F) 31G X 15/64" 0.3 ML MISC USE TO INJECT NOVOLOG THREE TIMES DAILY  isosorbide mononitrate (IMDUR) 60 MG 24 hr tablet TAKE 1 TABLET (60 MG TOTAL) BY MOUTH DAILY.   Na Sulfate-K Sulfate-Mg Sulf 17.5-3.13-1.6 GM/177ML SOLN Suprep (no substitutions)-TAKE AS DIRECTED.   nitroGLYCERIN (NITROSTAT) 0.4 MG SL tablet Place 1 tab under tongue for chest pain.  May repeat after 5 minutes x 2.  DO NOT TAKE MORE THAN 3 TABS DURING AN EPISODE OF CHEST PAIN   potassium chloride (KLOR-CON) 10 MEQ tablet Take 2 tablets (20 mEq total) by mouth daily.   atorvastatin (LIPITOR) 80 MG tablet Take 1 tablet (80 mg total) by mouth daily.   No current facility-administered medications for this visit. (Other)   REVIEW OF SYSTEMS: ROS   Positive for: Endocrine, Cardiovascular,  Eyes Negative for: Constitutional, Gastrointestinal, Neurological, Skin, Genitourinary, Musculoskeletal, HENT, Respiratory, Psychiatric, Allergic/Imm, Heme/Lymph Last edited by Annie Paras, COT on 11/11/2021  7:37 AM.     ALLERGIES Allergies  Allergen Reactions   Jardiance [Empagliflozin] Other (See Comments)    Frequent yeast infection   PAST MEDICAL HISTORY Past Medical History:  Diagnosis Date   Adrenal adenoma, left    Arthritis    Cancer ([place])    CKD (chronic kidney disease), stage II    Coronary artery disease 07-28-2018 followed [facility]  currently due to no insurance   per cardiac cath 02-06-2015 (positive mild lateral ishcemia on stress test)--- dLAD 80%,  mLAD 40%,  mPDA 80% (small vessel),  ostial D1 70%,  CFx with lumial irregarlities-- medical management   Depression    Diabetic neuropathy ([place])    Diabetic retinopathy ([place])    NPDR OU   Headache    History of Bell's palsy 07/2011   per pt residual facial pain on left side occasionally   History of cancer of vagina 1999   per pt completed radiation and chemo   History of sepsis 12/30/2017   positive blood culter fro E.coli   Hyperlipidemia    Hypertension    Hypertensive retinopathy    OU   Hypokalemia    Insulin dependent type 2 diabetes mellitus, uncontrolled    followed by pcp---  A1c was 11.7 on 06-15-2018 in epic   Myocardial infarction [facility])    10 yrs ago?   Nocturia    Peripheral neuropathy    PMB (postmenopausal bleeding)    Wears dentures    upper   Wears glasses    Past Surgical History:  Procedure Laterality Date   BREAST BIOPSY  2012   benign   CARDIAC CATHETERIZATION N/A 02/06/2015   Procedure: Left Heart Cath and Coronary Angiography;  Surgeon: Larey Dresser, MD;  Location: [facility];  Service: Cardiovascular;  Laterality: N/A;   CATARACT EXTRACTION Bilateral OD: 03/07/19, OS: 02/28/19   Dr. Gershon Crane   CATARACT EXTRACTION, BILATERAL      COLONOSCOPY     normal exam in [place], [place] 10-12 years ago   EYE SURGERY Bilateral OD: 03/07/19, OS: 02/28/19   Cat Sx - Dr. Gershon Crane   HYSTEROSCOPY WITH D & C N/A 08/01/2018   Procedure: DILATATION AND CURETTAGE /HYSTEROSCOPY;  Surgeon: Mora Bellman, MD;  Location: [facility];  Service: Gynecology;  Laterality: N/A;   ROBOTIC ASSISTED TOTAL HYSTERECTOMY WITH BILATERAL SALPINGO OOPHERECTOMY N/A 11/28/2018   Procedure: XI ROBOTIC ASSISTED TOTAL HYSTERECTOMY WITH BILATERAL SALPINGO OOPHORECTOMY;  Surgeon: Everitt Amber, MD;  Location: [facility];  Service: Gynecology;  Laterality: N/A;   SENTINEL NODE BIOPSY N/A 11/28/2018   Procedure: SENTINEL NODE  BIOPSY;  Surgeon: Everitt Amber, MD;  Location: [facility];  Service: Gynecology;  Laterality: N/A;   UMBILICAL HERNIA REPAIR  child   FAMILY HISTORY Family History  Problem Relation Age of Onset   Heart disease Mother    Hypertension Mother    Diabetes Mother    Cancer Father        hodgkins lymphoma   Heart disease Sister        heart attack   Diabetes Sister    Colon cancer Brother 6   Diabetes Maternal Aunt    Diabetes Maternal Uncle    Cancer Other        parent   Diabetes Other        parent   Heart disease Other        parent   Hyperlipidemia Other        parent   Hypertension Other        parent   Arthritis Other        parent   Breast cancer Neg Hx    Colon polyps Neg Hx    Esophageal cancer Neg Hx    Rectal cancer Neg Hx    Stomach cancer Neg Hx    SOCIAL HISTORY Social History   Tobacco Use   Smoking status: Never   Smokeless tobacco: Never  Vaping Use   Vaping Use: Never used  Substance Use Topics   Alcohol use: Not Currently   Drug use: Not Currently    Types: Marijuana    Comment: every 2 weeks per pt       OPHTHALMIC EXAM:  Base Eye Exam     Visual Acuity (Snellen - Linear)       Right Left   Dist [place] 20/20 20/300   Dist ph [place]  NI         Tonometry (Tonopen, 7:45 AM)        Right Left   Pressure 25 26  1  drop Brimonidine @7 :46a        Pupils       Pupils Dark Light Shape React APD   Right PERRL 3 2 Round Brisk None   Left PERRL 3 2 Round Brisk None         Visual Fields       Left Right    Full Full         Extraocular Movement       Right Left    Full, Ortho Full, Ortho         Neuro/Psych     Oriented x3: Yes   Mood/Affect: Normal         Dilation     Both eyes: 1.0% Mydriacyl, 2.5% Phenylephrine @ 7:50 AM           Slit Lamp and Fundus Exam     Slit Lamp Exam       Right Left   Lids/Lashes Dermatochalasis - upper lid Dermatochalasis - upper lid   Conjunctiva/Sclera Mild Melanosis Mild Melanosis   Cornea 1+ Punctate epithelial erosions 2+ central punctate epithelial erosions, decreased TBUT, mild tear film debris, well healed temporal cataract wounds   Anterior Chamber Deep, 0.5+cell/pigment Deep, 0.5+cell/pigment   Iris Round and dilated, No NVI Round and dilated, No NVI   Lens PC IOL in good position with trace PCO PC IOL in good position with 1+PCO   Anterior Vitreous Vitreous syneresis Vitreous syneresis, Posterior vitreous detachment, vitreous condensations  Fundus Exam       Right Left   Disc Pallor, Sharp rim Compact, Pink and Sharp, temporal PPA   C/D Ratio 0.1 0.1   Macula Good foveal reflex, persistent cystic changes/ edema inferior macula, scattered Microaneurysms Blunted foveal reflex, central edema - slightly improved, +exudates/MA/IRH -- greatest temporal macula -- improved   Vessels attenuated, Tortuous attenuated, Tortuous   Periphery Attached, scattered drusen, scattered DBH Attached, scattered DBH greatest posteriorly, mild peripheral drusen           IMAGING AND PROCEDURES  Imaging and Procedures for 11/11/2021  OCT, Retina - OU - Both Eyes       Right Eye Quality was good. Central Foveal Thickness: 272. Progression has been stable. Findings include no SRF, abnormal foveal  contour, intraretinal hyper-reflective material, intraretinal fluid, vitreomacular adhesion (Persistent IRF inferior macula and fovea -- slight improvement ).   Left Eye Quality was good. Central Foveal Thickness: 433. Progression has improved. Findings include no SRF, abnormal foveal contour, subretinal hyper-reflective material, intraretinal hyper-reflective material, intraretinal fluid, outer retinal atrophy, vitreomacular adhesion (Interval improvement in IRF greatest IT fovea and macula; Persistent IRHM temporal fovea and macula).   Notes *Images captured and stored on drive  Diagnosis / Impression:  Macular edema OU - likely combination of CME + DME OU OD: Persistent IRF inferior macula and fovea -- slight improvement  OS: Interval improvement in IRF greatest IT fovea and macula; Persistent IRHM temporal fovea and macula  Clinical management:  See below  Abbreviations: NFP - Normal foveal profile. CME - cystoid macular edema. PED - pigment epithelial detachment. IRF - intraretinal fluid. SRF - subretinal fluid. EZ - ellipsoid zone. ERM - epiretinal membrane. ORA - outer retinal atrophy. ORT - outer retinal tubulation. SRHM - subretinal hyper-reflective material      Intravitreal Injection, Pharmacologic Agent - OD - Right Eye       Time Out 11/11/2021. 8:46 AM. Confirmed correct patient, procedure, site, and patient consented.   Procedure Injection: 2 mg aflibercept 2 MG/0.05ML   Route: Intravitreal, Site: Right Eye   NDC: A3590391, Lot: [id number], Expiration date: 08/29/2022, Waste: 0 mL   Notes An AC tap was performed following injection due to elevated IOP using a 30 gauge needle on a syringe with the plunger removed. The needle was placed at the limbus at 5 oclock and approximately 0.07cc of aqueous was removed from the anterior chamber. Betadine was applied to the tap area before and after the paracentesis was performed. There were no complications. The patient  tolerated the procedure well. The IOP was rechecked and was found to be ~12 mmHg by palpation.      Intravitreal Injection, Pharmacologic Agent - OS - Left Eye       Time Out 11/11/2021. 8:46 AM. Confirmed correct patient, procedure, site, and patient consented.   Anesthesia Topical anesthesia was used. Anesthetic medications included Lidocaine 2%, Proparacaine 0.5%.   Procedure Preparation included 5% betadine to ocular surface, eyelid speculum. A (32g) needle was used.   Injection: 6 mg faricimab-svoa 6 MG/0.05ML   Route: Intravitreal, Site: Left Eye   NDC: S6832610, Lot: [id number], Expiration date: 12/29/2022, Waste: 0 mL   Post-op Post injection exam found visual acuity of at least counting fingers. The patient tolerated the procedure well. There were no complications. The patient received written and verbal post procedure care education. Post injection medications were not given.   Notes **SAMPLE MEDICATION ADMINISTERED**  [place] [id number]  ASSESSMENT/PLAN:    ICD-10-CM   1. Moderate nonproliferative diabetic retinopathy of both eyes with macular edema associated with type 2 diabetes mellitus (HCC)  E11.3313 OCT, Retina - OU - Both Eyes    Intravitreal Injection, Pharmacologic Agent - OD - Right Eye    Intravitreal Injection, Pharmacologic Agent - OS - Left Eye    faricimab-svoa (VABYSMO) 8m/0.05mL intravitreal injection    aflibercept (EYLEA) SOLN 2 mg    2. CME (cystoid macular edema), bilateral  H35.353     3. Essential hypertension  I10     4. Hypertensive retinopathy of both eyes  H35.033     5. Pseudophakia of both eyes  Z96.1     6. Bilateral ocular hypertension  H40.053      1,2. Moderate Non-proliferative diabetic retinopathy, OU OD with combination DME/CME  - last A1c 10.1 on 1.31.23             - h/o delayed to follow up from September 2021 to April 2022--7 mos instead of 4 wks**             - exam shows scattered IRH, edema and  tortuous blood vessels OU             - FA (10.23.20) shows leaking MA OU and paracentral petaloid hyperfluorecence OD --> CME/[place]             - OCT shows diabetic macular edema, OU; OD with CME/[place] component contributing as well             - s/p IVA OS #1 (10.23.20), #2 (11.18.20), #3 (12.18.20), #4 (02.24.21), #5 (03.24.21)             - s/p IVA OD #1 (11.06.20), #2 (12.18.20), #3 (02.24.21), #4 (03.24.21), #5 (04.21.21), #6 (5.26.21), #7 (07.26.21), #8 (08.25.21), #9 (09.23.21), #10 (04.26.22)             - s/p IVE OS #1 (04.21.21), #2 (5.26.21), #3 (07.26.21), #4 (08.25.21), #5 (09.23.21), #6 (04.26.22), #7 (05.25.22), #8 (07.01.22), #9 (07.29.22), #10 (08.29.22 - JDM), #11 (09.26.22), #12 (10.24.22)             - s/p IVE OD #1 (05.25.22), #2 (07.01.22), #3 (07.29.22), #4 (08.29.22 - JDM), #5 (09.26.22), #6 (10.24.22), #7 (11.21.22), #8 (12.19.22), #9 (01.23.23), #10 (02.20.23), #11 (03.22.23), #12 (04.19.23), #13 (05.17.23)             - s/p IV Vabysmo OS #1 (11.21.22, sample), #2 (12.19.22), #3 (01.23.23), #4 (02.20.23), #5 (03.22.23), #6 (04.19.23), #7 (05.17.23)             - BCVA OD 20/20; OS 20/350 -- decreased             - OCT today: OD: Persistent IRF inferior macula and fovea -- slightly improvement; OS: Interval improvement in IRF greatest IT fovea and macula; Persistent IRHM temporal fovea and macula at 4 wks             - recommend IVE OD #14 and IV Vabysmo OS #8 [sample] today, 06.14.23  - IVV prior auth has to be repeated -- pt switched from traditional Medicare to [employer]             - pt wishes to proceed w/ injections             - RBA of procedure discussed, questions answered             - informed consent obtained             -  [place] informed consent form signed and scanned on 12.18.2020       [facility] OU informed consent form re-signed and scanned on 05.17.23             - Vabysmo OS consent form signed and scanned on 11.21.22              - see procedure note             - Eylea4U benefits investigation started, 04.21.21             - reduce Pred Forte to qd instead of BID OU, continue Ketorolac QID OU             - f/u in 4 weeks, DFE, OCT, possible injection OU   3,4. Hypertensive retinopathy OU             - discussed importance of tight BP control             - monitor   5. Pseudophakia OU            - s/p CE/IOL OU Gershon Crane)                        OD: 03/07/2019                        OS: 02/28/2019             - IOLs in good position             - CME OU as above             - monitor   6. Ocular Hypertension OU             - IOP: 25,26 OU - ?steroid response, pt reports compliance with drops recently             - restart cosopt BID OU and brimonidine BID OU  Ophthalmic Meds Ordered this visit:  Meds ordered this encounter  Medications   brimonidine (ALPHAGAN) 0.2 % ophthalmic solution    Sig: Place 1 drop into both eyes 2 (two) times daily.    Dispense:  10 mL    Refill:  6   ketorolac (ACULAR) 0.5 % ophthalmic solution    Sig: Place 1 drop into both eyes 4 (four) times daily.    Dispense:  10 mL    Refill:  6   faricimab-svoa (VABYSMO) 55m/0.05mL intravitreal injection   aflibercept (EYLEA) SOLN 2 mg     Return in about 4 weeks (around 12/09/2021) for DFE, OCT, possible injection.  There are no Patient Instructions on file for this visit.  Explained the diagnoses, plan, and follow up with the patient and they expressed understanding.  Patient expressed understanding of the importance of proper follow up care.   This document serves as a record of services personally performed by BGardiner Sleeper MD, PhD. It was created on their behalf by MOrvan Falconer an ophthalmic technician. The creation of this record is the provider's dictation and/or activities during the visit.    Electronically signed by: MOrvan Falconer OA, 11/11/21  1:10 PM  This document serves as a record of services  personally performed by BGardiner Sleeper MD, PhD. It was created on their behalf by RLeonie Douglas an ophthalmic technician. The creation of this record is the provider's dictation and/or activities during the visit.    Electronically signed by: RShirlean Mylar  Nyra Capes COA, 11/11/21  1:10 PM   Gardiner Sleeper, M.D., Ph.D. Diseases & Surgery of the Retina and Vitreous [facility]  I have reviewed the above documentation for accuracy and completeness, and I agree with the above. Gardiner Sleeper, M.D., Ph.D. 11/11/21 1:14 PM    Abbreviations: M myopia (nearsighted); A astigmatism; H hyperopia (farsighted); P presbyopia; Mrx spectacle prescription;  CTL contact lenses; OD right eye; OS left eye; OU both eyes  XT exotropia; ET esotropia; PEK punctate epithelial keratitis; PEE punctate epithelial erosions; DES dry eye syndrome; MGD meibomian gland dysfunction; ATs artificial tears; PFAT's preservative free artificial tears; [place] nuclear sclerotic cataract; PSC posterior subcapsular cataract; ERM epi-retinal membrane; PVD posterior vitreous detachment; RD retinal detachment; DM diabetes mellitus; DR diabetic retinopathy; NPDR non-proliferative diabetic retinopathy; PDR proliferative diabetic retinopathy; CSME clinically significant macular edema; DME diabetic macular edema; dbh dot blot hemorrhages; CWS cotton wool spot; POAG primary open angle glaucoma; C/D cup-to-disc ratio; HVF humphrey visual field; GVF goldmann visual field; OCT optical coherence tomography; IOP intraocular pressure; BRVO Branch retinal vein occlusion; CRVO central retinal vein occlusion; CRAO central retinal artery occlusion; BRAO branch retinal artery occlusion; RT retinal tear; SB scleral buckle; PPV pars plana vitrectomy; VH Vitreous hemorrhage; PRP panretinal laser photocoagulation; IVK intravitreal kenalog; VMT vitreomacular traction; MH Macular hole;  NVD neovascularization of the disc; NVE neovascularization elsewhere;  AREDS age related eye disease study; ARMD age related macular degeneration; POAG primary open angle glaucoma; EBMD epithelial/anterior basement membrane dystrophy; ACIOL anterior chamber intraocular lens; IOL intraocular lens; PCIOL posterior chamber intraocular lens; Phaco/IOL phacoemulsification with intraocular lens placement; [place] photorefractive keratectomy; LASIK laser assisted in situ keratomileusis; HTN hypertension; DM diabetes mellitus; COPD chronic obstructive pulmonary disease

## 2021-11-05 ENCOUNTER — Other Ambulatory Visit: Payer: Self-pay

## 2021-11-11 ENCOUNTER — Other Ambulatory Visit: Payer: Self-pay

## 2021-11-11 ENCOUNTER — Encounter (INDEPENDENT_AMBULATORY_CARE_PROVIDER_SITE_OTHER): Payer: Self-pay | Admitting: Ophthalmology

## 2021-11-11 ENCOUNTER — Ambulatory Visit (INDEPENDENT_AMBULATORY_CARE_PROVIDER_SITE_OTHER): Payer: Medicare Other | Admitting: Ophthalmology

## 2021-11-11 DIAGNOSIS — E113313 Type 2 diabetes mellitus with moderate nonproliferative diabetic retinopathy with macular edema, bilateral: Secondary | ICD-10-CM

## 2021-11-11 DIAGNOSIS — H35033 Hypertensive retinopathy, bilateral: Secondary | ICD-10-CM

## 2021-11-11 DIAGNOSIS — H35353 Cystoid macular degeneration, bilateral: Secondary | ICD-10-CM | POA: Diagnosis not present

## 2021-11-11 DIAGNOSIS — H40053 Ocular hypertension, bilateral: Secondary | ICD-10-CM | POA: Diagnosis not present

## 2021-11-11 DIAGNOSIS — I10 Essential (primary) hypertension: Secondary | ICD-10-CM | POA: Diagnosis not present

## 2021-11-11 DIAGNOSIS — Z961 Presence of intraocular lens: Secondary | ICD-10-CM | POA: Diagnosis not present

## 2021-11-11 MED ORDER — KETOROLAC TROMETHAMINE 0.5 % OP SOLN
1.0000 [drp] | Freq: Four times a day (QID) | OPHTHALMIC | 6 refills | Status: DC
  Filled 2021-11-11: qty 10, 50d supply, fill #0
  Filled 2022-01-20: qty 10, 20d supply, fill #1

## 2021-11-11 MED ORDER — FARICIMAB-SVOA 6 MG/0.05ML IZ SOLN
6.0000 mg | INTRAVITREAL | Status: AC | PRN
  Administered 2021-11-11: 6 mg via INTRAVITREAL

## 2021-11-11 MED ORDER — BRIMONIDINE TARTRATE 0.2 % OP SOLN
1.0000 [drp] | Freq: Two times a day (BID) | OPHTHALMIC | 6 refills | Status: DC
  Filled 2021-11-11: qty 10, 50d supply, fill #0
  Filled 2022-01-20: qty 10, 50d supply, fill #1

## 2021-11-11 MED ORDER — AFLIBERCEPT 2MG/0.05ML IZ SOLN FOR KALEIDOSCOPE
2.0000 mg | INTRAVITREAL | Status: AC | PRN
  Administered 2021-11-11: 2 mg via INTRAVITREAL

## 2021-11-12 ENCOUNTER — Other Ambulatory Visit: Payer: Self-pay | Admitting: General Practice

## 2021-11-13 ENCOUNTER — Other Ambulatory Visit: Payer: Self-pay

## 2021-11-13 MED ORDER — ATORVASTATIN CALCIUM 80 MG PO TABS
80.0000 mg | ORAL_TABLET | Freq: Every day | ORAL | 5 refills | Status: DC
  Filled 2021-11-13: qty 30, 30d supply, fill #0
  Filled 2021-12-24: qty 30, 30d supply, fill #1
  Filled 2022-01-20: qty 90, 90d supply, fill #2
  Filled 2022-04-12: qty 30, 30d supply, fill #3

## 2021-11-25 ENCOUNTER — Other Ambulatory Visit: Payer: Self-pay

## 2021-11-25 ENCOUNTER — Other Ambulatory Visit: Payer: Self-pay | Admitting: Internal Medicine

## 2021-11-25 DIAGNOSIS — I2510 Atherosclerotic heart disease of native coronary artery without angina pectoris: Secondary | ICD-10-CM

## 2021-11-25 DIAGNOSIS — I10 Essential (primary) hypertension: Secondary | ICD-10-CM

## 2021-11-25 MED ORDER — CARVEDILOL 25 MG PO TABS
ORAL_TABLET | Freq: Two times a day (BID) | ORAL | 2 refills | Status: DC
  Filled 2021-11-25: qty 60, 30d supply, fill #0
  Filled 2022-01-05: qty 60, 30d supply, fill #1
  Filled 2022-02-23: qty 60, 30d supply, fill #2

## 2021-11-25 NOTE — Telephone Encounter (Signed)
Requested Prescriptions  Pending Prescriptions Disp Refills  . carvedilol (COREG) 25 MG tablet 60 tablet 5    Sig: TAKE 1 TABLET (25 MG TOTAL) BY MOUTH 2 (TWO) TIMES DAILY WITH A MEAL.     Cardiovascular: Beta Blockers 3 Failed - 11/25/2021 12:18 PM      Failed - Cr in normal range and within 360 days    Creatinine, Ser  Date Value Ref Range Status  01/30/2021 1.14 (H) 0.57 - 1.00 mg/dL Final   Creatinine,U  Date Value Ref Range Status  07/15/2015 107.0 mg/dL Final   Creatinine, Urine  Date Value Ref Range Status  01/01/2018 52.71 mg/dL Final         Passed - AST in normal range and within 360 days    AST  Date Value Ref Range Status  01/20/2021 15 0 - 40 IU/L Final         Passed - ALT in normal range and within 360 days    ALT  Date Value Ref Range Status  01/20/2021 17 0 - 32 IU/L Final         Passed - Last BP in normal range    BP Readings from Last 1 Encounters:  06/30/21 135/84         Passed - Last Heart Rate in normal range    Pulse Readings from Last 1 Encounters:  06/30/21 75         Passed - Valid encounter within last 6 months    Recent Outpatient Visits          4 months ago Type 2 diabetes mellitus with morbid obesity ([place])   [place], Deborah B, MD   6 months ago Subacute vaginitis   [place], MD   9 months ago Type 2 diabetes mellitus with morbid obesity [facility])   [place], MD   11 months ago [place], Deborah B, MD   1 year ago Essential hypertension   [place], RPH-CPP      Future Appointments            In 1 month Wynetta Emery, Dalbert Batman, MD [place]

## 2021-11-26 ENCOUNTER — Other Ambulatory Visit: Payer: Self-pay

## 2021-12-06 NOTE — Progress Notes (Deleted)
Cardiology Clinic Note   Patient Name: Annalicia Renfrew Date of Encounter: 12/06/2021  Primary Care Provider:  Ladell Pier, MD Primary Cardiologist:  Buford Dresser, MD  Patient Profile    Jamesha Ellsworth 61 year old female presents to the clinic today for follow-up evaluation of her  hypertension.  Past Medical History    Past Medical History:  Diagnosis Date   Adrenal adenoma, left    Arthritis    Cancer ([place])    CKD (chronic kidney disease), stage II    Coronary artery disease 07-28-2018 followed [facility])  currently due to no insurance   per cardiac cath 02-06-2015 (positive mild lateral ishcemia on stress test)--- dLAD 80%,  mLAD 40%,  mPDA 80% (small vessel),  ostial D1 70%,  CFx with lumial irregarlities-- medical management   Depression    Diabetic neuropathy ([place])    Diabetic retinopathy ([place])    NPDR OU   Headache    History of Bell's palsy 07/2011   per pt residual facial pain on left side occasionally   History of cancer of vagina 1999   per pt completed radiation and chemo   History of sepsis 12/30/2017   positive blood culter fro E.coli   Hyperlipidemia    Hypertension    Hypertensive retinopathy    OU   Hypokalemia    Insulin dependent type 2 diabetes mellitus, uncontrolled    followed by pcp---  A1c was 11.7 on 06-15-2018 in epic   Myocardial infarction [facility])    10 yrs ago?   Nocturia    Peripheral neuropathy    PMB (postmenopausal bleeding)    Wears dentures    upper   Wears glasses    Past Surgical History:  Procedure Laterality Date   BREAST BIOPSY  2012   benign   CARDIAC CATHETERIZATION N/A 02/06/2015   Procedure: Left Heart Cath and Coronary Angiography;  Surgeon: Larey Dresser, MD;  Location: [place] [facility];  Service: Cardiovascular;  Laterality: N/A;   CATARACT EXTRACTION Bilateral OD: 03/07/19, OS: 02/28/19   Dr. Gershon Crane   CATARACT EXTRACTION, BILATERAL     COLONOSCOPY     normal exam in [place],  [place] 10-12 years ago   EYE SURGERY Bilateral OD: 03/07/19, OS: 02/28/19   Cat Sx - Dr. Gershon Crane   HYSTEROSCOPY WITH D & C N/A 08/01/2018   Procedure: DILATATION AND CURETTAGE /HYSTEROSCOPY;  Surgeon: Mora Bellman, MD;  Location: [place];  Service: Gynecology;  Laterality: N/A;   ROBOTIC ASSISTED TOTAL HYSTERECTOMY WITH BILATERAL SALPINGO OOPHERECTOMY N/A 11/28/2018   Procedure: XI ROBOTIC ASSISTED TOTAL HYSTERECTOMY WITH BILATERAL SALPINGO OOPHORECTOMY;  Surgeon: Everitt Amber, MD;  Location: [facility];  Service: Gynecology;  Laterality: N/A;   SENTINEL NODE BIOPSY N/A 11/28/2018   Procedure: SENTINEL NODE BIOPSY;  Surgeon: Everitt Amber, MD;  Location: [facility];  Service: Gynecology;  Laterality: N/A;   UMBILICAL HERNIA REPAIR  child    Allergies  Allergies  Allergen Reactions   Jardiance [Empagliflozin] Other (See Comments)    Frequent yeast infection    History of Present Illness    Ms. Matson has a PMH of coronary artery disease, hypertension, hypercholesterolemia, DM2 on insulin, and obesity.  She was seen by Dr. Harrell Gave on 07/02/2020.  During that time she reported she had completed her antibiotics but was not yet back to her baseline.  She still had increased phlegm and shortness of breath.  She denied fever and chills.  She reported having an  episode 3 weeks ago where she was in the store with her daughter.  She returned to her car, felt that, and briefly lost consciousness.  She reported she vomited on herself.  She denied episodes of confusion surrounding the event.  She reported that she went to bed immediately following the event that fell asleep.  She reported occasional episodes of shortness of breath.  She also reported that she had a lack of energy.  She noted that she did have exposure to secondhand smoke daily with her brother smoking.  Her blood pressure was elevated and she reported that she does not check her blood pressure at home.  She had not taken  her a.m. antihypertensive medications.  When her medications were reviewed she reported that she took her Jardiance, aspirin, potassium, carvedilol in the morning and several hours later will take her amlodipine, atorvastatin, and Imdur.  She had only been taking her carvedilol 1 time per day.  She was educated about carvedilol being twice daily medication.  She denied chest pain, PND, orthopnea, lower extremity swelling, palpitations, and unexplained weight gain.  She presented to the clinic 10/08/20 for follow-up evaluation stated she felt well.  She reported that she had been having much less phlegm and had not been noticing chest discomfort.  She had noticed some lower extremity leg pain that improved with ambulation.  She reported that her brother still smoked in her home.  We reviewed her coronary CTA and her previous echocardiogram.  We also reviewed her last lipid panel.  We reviewed the importance of heart healthy low-sodium high-fiber diet.  She reported that she had been on atorvastatin 40 for quite some time.  I  increased her atorvastatin to 80 mg daily, planned repeat her fasting lipids and LFTs in 8 weeks, asked her to her monitor blood pressure and keep a log.  We planned follow-up with Dr. Harrell Gave in 4 to 6 months.  Her lipid panel 01/20/2021 showed an improvement in her total cholesterol down to 137 with a LDL cholesterol down from 92-72.  She presented to the clinic 04/20/21 for follow-up evaluation stated she felt well.  She did note limitations in her physical activity related to her back pain.  She reported that she was fairly sedentary and did not have a formal exercise routine due to her back discomfort.  We reviewed her most recent cholesterol panel with an LDL of 72.  She reported that she did enjoy white bread.  We discussed adding ezetimibe to her medication regimen.  She wished to work on her diet and exercise before adding another medication.  She enjoyed eating fruits and  vegetables as well.  She was staying away from salt.  Her blood pressure was 132/78.  I will gave her chair exercises, asked her to increase her walking as tolerated, add a fiber supplement to her diet, and planned follow-up for 6 months.  She presents to the clinic today for follow-up evaluation states***  Today she denies chest pain, shortness of breath, lower extremity edema, fatigue, palpitations, melena, hematuria, hemoptysis, diaphoresis, weakness, presyncope, syncope, orthopnea, and PND.   Home Medications    Prior to Admission medications   Medication Sig Start Date End Date Taking? Authorizing Provider  Accu-Chek Softclix Lancets lancets Use to check blood sugar 3 times daily. 08/27/19   Ladell Pier, MD  albuterol (VENTOLIN HFA) 108 (90 Base) MCG/ACT inhaler Inhale 2 puffs into the lungs every 6 (six) hours as needed for wheezing or shortness of  breath. 11/12/19   Ladell Pier, MD  amLODipine (NORVASC) 10 MG tablet TAKE 1 TABLET (10 MG TOTAL) BY MOUTH DAILY. 07/29/20 07/29/21  Ladell Pier, MD  aspirin EC 81 MG tablet Take 1 tablet (81 mg total) daily by mouth. 04/18/17   Ladell Pier, MD  atorvastatin (LIPITOR) 40 MG tablet TAKE 1 TABLET DAILY BY MOUTH. 09/29/20 09/29/21  Ladell Pier, MD  Blood Glucose Monitoring Suppl (ACCU-CHEK GUIDE) w/Device KIT 1 kit by Does not apply route in the morning, at noon, and at bedtime. Use to check blood sugar 3 times daily. 08/27/19   Ladell Pier, MD  Bromfenac Sodium (PROLENSA) 0.07 % SOLN Place 1 drop into both eyes 4 (four) times daily. 03/23/19   Bernarda Caffey, MD  butalbital-acetaminophen-caffeine (FIORICET) [phone] MG tablet Take 1 tablet by mouth every 6 (six) hours as needed for headache. 12/21/19   Marcial Pacas, MD  carvedilol (COREG) 25 MG tablet TAKE 1 TABLET (25 MG TOTAL) BY MOUTH 2 (TWO) TIMES DAILY WITH A MEAL. 09/16/20 09/16/21  Ladell Pier, MD  empagliflozin (JARDIANCE) 10 MG TABS tablet TAKE 1 TABLET (10  MG TOTAL) BY MOUTH DAILY. 03/28/20 03/28/21  Ladell Pier, MD  fluconazole (DIFLUCAN) 150 MG tablet Take 150 mg by mouth once. 06/11/20   [provider]  glucose blood (ACCU-CHEK GUIDE) test strip Use to check blood sugar 3 times daily. 08/27/19   Ladell Pier, MD  insulin aspart (NOVOLOG) 100 UNIT/ML injection Inject 28 Units into the skin 3 (three) times daily before meals. 12/18/19   Ladell Pier, MD  insulin glargine (LANTUS) 100 UNIT/ML Solostar Pen INJECT 58 UNITS INTO THE SKIN DAILY. 03/28/20 03/28/21  Ladell Pier, MD  Insulin Syringe-Needle U-100 (BD INSULIN SYRINGE) 29G X 1/2" 0.3 ML MISC Use to inject Novolog TID. 05/07/20   Ladell Pier, MD  Insulin Syringe-Needle U-100 31G X 15/64" 0.3 ML MISC USE TO INJECT NOVOLOG THREE TIMES DAILY 05/07/20 05/07/21  Ladell Pier, MD  isosorbide mononitrate (IMDUR) 60 MG 24 hr tablet TAKE 1 TABLET (60 MG TOTAL) BY MOUTH DAILY. 02/14/20 02/13/21  Buford Dresser, MD  ketorolac (ACULAR) 0.5 % ophthalmic solution Place 1 drop into both eyes 4 (four) times daily. 09/23/20 09/23/21  Bernarda Caffey, MD  nitroGLYCERIN (NITROSTAT) 0.4 MG SL tablet Place 1 tab under tongue for chest pain.  May repeat after 5 minutes x 2.  DO NOT TAKE MORE THAN 3 TABS DURING AN EPISODE OF CHEST PAIN Patient taking differently: Place 0.4 mg under the tongue every 5 (five) minutes as needed for chest pain. DO NOT TAKE MORE THAN 3 TABS DURING AN EPISODE OF CHEST PAIN 09/18/18   Buford Dresser, MD  oxybutynin (DITROPAN-XL) 5 MG 24 hr tablet Take 1 tablet (5 mg total) by mouth at bedtime. 09/16/20   Ladell Pier, MD  potassium chloride (KLOR-CON) 10 MEQ tablet TAKE 1 TABLET BY MOUTH EVERY DAY 06/19/20   Ladell Pier, MD  prednisoLONE acetate (PRED FORTE) 1 % ophthalmic suspension Place 1 drop into both eyes 4 (four) times daily. 09/23/20   Bernarda Caffey, MD  topiramate (TOPAMAX) 50 MG tablet Take 1 tablet (50 mg total) by mouth 2  (two) times daily. 12/21/19   Marcial Pacas, MD  TRUEPLUS PEN NEEDLES 32G X 4 MM MISC USE AS DIRECTED WITH INSULIN 10/30/19   Ladell Pier, MD    Family History    Family History  Problem Relation Age of  Onset   Heart disease Mother    Hypertension Mother    Diabetes Mother    Cancer Father        hodgkins lymphoma   Heart disease Sister        heart attack   Diabetes Sister    Colon cancer Brother 73   Diabetes Maternal Aunt    Diabetes Maternal Uncle    Cancer Other        parent   Diabetes Other        parent   Heart disease Other        parent   Hyperlipidemia Other        parent   Hypertension Other        parent   Arthritis Other        parent   Breast cancer Neg Hx    Colon polyps Neg Hx    Esophageal cancer Neg Hx    Rectal cancer Neg Hx    Stomach cancer Neg Hx    She indicated that her mother is deceased. She indicated that her father is deceased. She indicated that her sister is alive. She indicated that the status of her brother is unknown. She indicated that the status of her maternal aunt is unknown. She indicated that the status of her maternal uncle is unknown. She indicated that the status of her neg hx is unknown. She indicated that the status of her other is unknown.   Social History    Social History   Socioeconomic History   Marital status: Single    Spouse name: Not on file   Number of children: 2   Years of education: 12   Highest education level: GED or equivalent  Occupational History   Occupation: retired  Tobacco Use   Smoking status: Never   Smokeless tobacco: Never  Vaping Use   Vaping Use: Never used  Substance and Sexual Activity   Alcohol use: Not Currently   Drug use: Not Currently    Types: Marijuana    Comment: every 2 weeks per pt   Sexual activity: Not Currently  Other Topics Concern   Not on file  Social History Narrative   Lives at home with her grandson.   Right-handed.   No daily caffeine use.   Social  Determinants of Health   Financial Resource Strain: Low Risk  (10/22/2021)   Overall Financial Resource Strain (CARDIA)    Difficulty of Paying Living Expenses: Not very hard  Food Insecurity: Food Insecurity Present (10/22/2021)   Hunger Vital Sign    Worried About Running Out of Food in the Last Year: Sometimes true    Ran Out of Food in the Last Year: Sometimes true  Transportation Needs: No Transportation Needs (10/22/2021)   PRAPARE - Hydrologist (Medical): No    Lack of Transportation (Non-Medical): No  Physical Activity: Inactive (10/22/2021)   Exercise Vital Sign    Days of Exercise per Week: 0 days    Minutes of Exercise per Session: 0 min  Stress: No Stress Concern Present (10/22/2021)   [place]    Feeling of Stress : Only a little  Social Connections: Socially Isolated (10/22/2021)   Social Connection and Isolation Panel [NHANES]    Frequency of Communication with Friends and Family: More than three times a week    Frequency of Social Gatherings with Friends and Family: Twice a week  Attends Religious Services: Never    Active Member of Clubs or Organizations: No    Attends Archivist Meetings: Never    Marital Status: Never married  Intimate Partner Violence: Not At Risk (06/16/2017)   Humiliation, Afraid, Rape, and Kick questionnaire    Fear of Current or Ex-Partner: No    Emotionally Abused: No    Physically Abused: No    Sexually Abused: No     Review of Systems    General:  No chills, fever, night sweats or weight changes.  Cardiovascular:  No chest pain, dyspnea on exertion, edema, orthopnea, palpitations, paroxysmal nocturnal dyspnea. Dermatological: No rash, lesions/masses Respiratory: No cough, dyspnea Urologic: No hematuria, dysuria Abdominal:   No nausea, vomiting, diarrhea, bright red blood per rectum, melena, or hematemesis Neurologic:  No visual  changes, wkns, changes in mental status. All other systems reviewed and are otherwise negative except as noted above.  Physical Exam    VS:  There were no vitals taken for this visit. , BMI There is no height or weight on file to calculate BMI. GEN: Well nourished, well developed, in no acute distress. HEENT: normal. Neck: Supple, no JVD, carotid bruits, or masses. Cardiac: RRR, no murmurs, rubs, or gallops. No clubbing, cyanosis, edema.  Radials/DP/PT 2+ and equal bilaterally.  Respiratory:  Respirations regular and unlabored, clear to auscultation bilaterally. GI: Soft, nontender, nondistended, BS + x 4. MS: no deformity or atrophy. Skin: warm and dry, no rash. Neuro:  Strength and sensation are intact. Psych: Normal affect.  Accessory Clinical Findings    Recent Labs: 01/20/2021: ALT 17 01/30/2021: BUN 19; Creatinine, Ser 1.14; Sodium 142 10/07/2021: Potassium 3.6   Recent Lipid Panel    Component Value Date/Time   CHOL 131 09/01/2021 0949   TRIG 103 09/01/2021 0949   HDL 41 09/01/2021 0949   CHOLHDL 3.2 09/01/2021 0949   CHOLHDL 5 07/15/2015 1513   VLDL 44.0 (H) 07/15/2015 1513   LDLCALC 71 09/01/2021 0949   LDLDIRECT 93.0 07/15/2015 1513    ECG personally reviewed by me today-none today.  Echocardiogram 04/21/2020 1. Left ventricular ejection fraction, by estimation, is 50 to 55%. The  left ventricle has low normal function. The left ventricle demonstrates  regional wall motion abnormalities (see scoring diagram/findings for  description). Left ventricular diastolic   parameters are consistent with Grade I diastolic dysfunction (impaired  relaxation). Elevated left atrial pressure.   2. Right ventricular systolic function is normal. The right ventricular  size is normal.   3. Large pericardial effusion. The pericardial effusion is  circumferential. There is no evidence of cardiac tamponade. Moderate  pleural effusion in the left lateral region.   4. The mitral  valve is normal in structure. Mild mitral valve  regurgitation. No evidence of mitral stenosis.   5. The aortic valve is normal in structure. Aortic valve regurgitation is  not visualized. No aortic stenosis is present.   6. Aortic dilatation noted. There is mild dilatation of the ascending  aorta, measuring 40 mm.   7. The inferior vena cava is normal in size with greater than 50%  respiratory variability, suggesting right atrial pressure of 3 mmHg.   Comparison(s): Unchanged pericardial effusion from 09/06/2018.    CT coronary 09/29/19 Coronary calcium score: The patient's coronary artery calcium score is 350, which places the patient in the 97th percentile.   Coronary arteries: Normal coronary origins.  Right dominance.   Right Coronary Artery: Dominant. Mild 25-49% non-calcified plaque of the mid-vessel (CADRADS2).  Left Main Coronary Artery: Eccentric calcification with minimal 1-24% mixed stenosis (CADRADS1). Bifurcates into the LAD and LCx vessels.   Left Anterior Descending Coronary Artery: The LAD does not reach the apex and may be occluded in the distal portion. There is moderate 50-69% (CADRADS3) mid-vessel non-calcified stenosis.   Left Circumflex Artery: Minimal distal 1-24% diffuse non-calcified stenosis. Smaller distal OM1 branch has calcified plaque with mild to moderate stenosis.   Aorta: Normal size, 37 mm at the mid ascending aorta (level of the PA bifurcation) measured double oblique. No calcifications. No dissection.   Aortic Valve: Trileaflet.  No calcifications.   Other findings:   Normal pulmonary vein drainage into the left atrium.   Normal left atrial appendage without a thrombus.   Dilated main pulmonary artery measuring 32 mm, suggestive of pulmonary hypertension.   Moderate sized circumferential pericardial effusion.   IMPRESSION: 1. Moderate non-calcified mid-LAD stenosis, possible distal LAD occlusion, CADRADS = 3. CT FFR will be  performed and reported separately.   2. Coronary calcium score of 350. This was 97th percentile for age and sex matched control.   3. Normal coronary origin with right dominance.   4.  Moderate-sized circumferential pericardial effusion   5. Dilated main pulmonary artery to 32 mm, suggestive of pulmonary hypertension   6.  See radiology findings regarding lung parenchymal changes   Assessment & Plan   1.  Essential hypertension-BP today ***132/78.  Reports blood pressures are well controlled at home.   Continue carvedilol, amlodipine, Imdur Heart healthy low-sodium diet Increase physical activity as tolerated-instructions were chair exercises reviewed Maintain blood pressure log  Coronary artery disease-no chest pain today.  Underwent coronary CTA 09/29/2019 showed RCA 25-49 percent plaque, LAD 1-24% and 50-69%, left circumflex 1-24%.  Details above. Continue amlodipine, Imdur, nitroglycerin, aspirin, carvedilol Heart healthy low-sodium diet Increase physical activity as tolerated  Hyperlipidemia-09/01/2021: Cholesterol, Total 131; HDL 41; LDL Chol Calc (NIH) 71; Triglycerides 103 Continue atorvastatin Continue fiber supplement Heart healthy low-sodium high-fiber diet Increase physical activity as tolerated  Pericardial effusion-no recent episodes of chest discomfort.  Has been able to increase physical activity and denies exertional chest pain.    Previous ESR normal, CRP elevated. Continue to monitor.  Disposition: Follow-up with Dr. Harrell Gave in 6-9 months.   Jossie Ng. Katlyn Muldrew NP-C    12/06/2021, 6:10 PM [facility] [place] Office [phone] Fax [phone]  Notice: This dictation was prepared with Dragon dictation along with smaller phrase technology. Any transcriptional errors that result from this process are unintentional and may not be corrected upon review.  I spent 15 ***minutes examining this patient, reviewing  medications, and using patient centered shared decision making involving her cardiac care.  Prior to her visit I spent greater than 20 minutes reviewing her past medical history,  medications, and prior cardiac tests.

## 2021-12-08 ENCOUNTER — Ambulatory Visit: Payer: Medicare Other | Admitting: General Practice

## 2021-12-08 NOTE — Progress Notes (Unsigned)
Cardiology Clinic Note   Patient Name: Kimberly Frazier Date of Encounter: 12/09/2021  Primary Care Provider:  Ladell Pier, MD Primary Cardiologist:  Buford Dresser, MD  Patient Profile    Kimberly Frazier 61 year old female presents to the clinic today for follow-up evaluation of her  hypertension.  Past Medical History    Past Medical History:  Diagnosis Date   Adrenal adenoma, left    Arthritis    Cancer ([place])    CKD (chronic kidney disease), stage II    Coronary artery disease 07-28-2018 followed [facility])  currently due to no insurance   per cardiac cath 02-06-2015 (positive mild lateral ishcemia on stress test)--- dLAD 80%,  mLAD 40%,  mPDA 80% (small vessel),  ostial D1 70%,  CFx with lumial irregarlities-- medical management   Depression    Diabetic neuropathy ([place])    Diabetic retinopathy ([place])    NPDR OU   Headache    History of Bell's palsy 07/2011   per pt residual facial pain on left side occasionally   History of cancer of vagina 1999   per pt completed radiation and chemo   History of sepsis 12/30/2017   positive blood culter fro E.coli   Hyperlipidemia    Hypertension    Hypertensive retinopathy    OU   Hypokalemia    Insulin dependent type 2 diabetes mellitus, uncontrolled    followed by pcp---  A1c was 11.7 on 06-15-2018 in epic   Myocardial infarction [facility])    10 yrs ago?   Nocturia    Peripheral neuropathy    PMB (postmenopausal bleeding)    Wears dentures    upper   Wears glasses    Past Surgical History:  Procedure Laterality Date   BREAST BIOPSY  2012   benign   CARDIAC CATHETERIZATION N/A 02/06/2015   Procedure: Left Heart Cath and Coronary Angiography;  Surgeon: Larey Dresser, MD;  Location: [facility];  Service: Cardiovascular;  Laterality: N/A;   CATARACT EXTRACTION Bilateral OD: 03/07/19, OS: 02/28/19   Dr. Gershon Crane   CATARACT EXTRACTION, BILATERAL     COLONOSCOPY     normal exam in [place],  [place] 10-12 years ago   EYE SURGERY Bilateral OD: 03/07/19, OS: 02/28/19   Cat Sx - Dr. Gershon Crane   HYSTEROSCOPY WITH D & C N/A 08/01/2018   Procedure: DILATATION AND CURETTAGE /HYSTEROSCOPY;  Surgeon: Mora Bellman, MD;  Location: [place];  Service: Gynecology;  Laterality: N/A;   ROBOTIC ASSISTED TOTAL HYSTERECTOMY WITH BILATERAL SALPINGO OOPHERECTOMY N/A 11/28/2018   Procedure: XI ROBOTIC ASSISTED TOTAL HYSTERECTOMY WITH BILATERAL SALPINGO OOPHORECTOMY;  Surgeon: Everitt Amber, MD;  Location: [facility];  Service: Gynecology;  Laterality: N/A;   SENTINEL NODE BIOPSY N/A 11/28/2018   Procedure: SENTINEL NODE BIOPSY;  Surgeon: Everitt Amber, MD;  Location: [facility];  Service: Gynecology;  Laterality: N/A;   UMBILICAL HERNIA REPAIR  child    Allergies  Allergies  Allergen Reactions   Jardiance [Empagliflozin] Other (See Comments)    Frequent yeast infection    History of Present Illness    Ms. Kocak has a PMH of coronary artery disease, hypertension, hypercholesterolemia, DM2 on insulin, and obesity.  She was seen by Dr. Harrell Gave on 07/02/2020.  During that time she reported she had completed her antibiotics but was not yet back to her baseline.  She still had increased phlegm and shortness of breath.  She denied fever and chills.  She reported having an  episode 3 weeks ago where she was in the store with her daughter.  She returned to her car, felt that, and briefly lost consciousness.  She reported she vomited on herself.  She denied episodes of confusion surrounding the event.  She reported that she went to bed immediately following the event that fell asleep.  She reported occasional episodes of shortness of breath.  She also reported that she had a lack of energy.  She noted that she did have exposure to secondhand smoke daily with her brother smoking.  Her blood pressure was elevated and she reported that she does not check her blood pressure at home.  She had not taken  her a.m. antihypertensive medications.  When her medications were reviewed she reported that she took her Jardiance, aspirin, potassium, carvedilol in the morning and several hours later will take her amlodipine, atorvastatin, and Imdur.  She had only been taking her carvedilol 1 time per day.  She was educated about carvedilol being twice daily medication.  She denied chest pain, PND, orthopnea, lower extremity swelling, palpitations, and unexplained weight gain.  She presented to the clinic 10/08/20 for follow-up evaluation stated she felt well.  She reported that she had been having much less phlegm and had not been noticing chest discomfort.  She had noticed some lower extremity leg pain that improved with ambulation.  She reported that her brother still smoked in her home.  We reviewed her coronary CTA and her previous echocardiogram.  We also reviewed her last lipid panel.  We reviewed the importance of heart healthy low-sodium high-fiber diet.  She reported that she had been on atorvastatin 40 for quite some time.  I  increased her atorvastatin to 80 mg daily, planned repeat her fasting lipids and LFTs in 8 weeks, asked her to her monitor blood pressure and keep a log.  We planned follow-up with Dr. Harrell Gave in 4 to 6 months.  Her lipid panel 01/20/2021 showed an improvement in her total cholesterol down to 137 with a LDL cholesterol down from 92-72.  She presented to the clinic 04/20/21 for follow-up evaluation stated she felt well.  She did note limitations in her physical activity related to her back pain.  She reported that she was fairly sedentary and did not have a formal exercise routine due to her back discomfort.  We reviewed her most recent cholesterol panel with an LDL of 72.  She reported that she did enjoy white bread.  We discussed adding ezetimibe to her medication regimen.  She wished to work on her diet and exercise before adding another medication.  She enjoyed eating fruits and  vegetables as well.  She was staying away from salt.  Her blood pressure was 132/78.  I will gave her chair exercises, asked her to increase her walking as tolerated, add a fiber supplement to her diet, and planned follow-up for 6 months.  She presents to the clinic today for follow-up evaluation states she has been noticing loose stools over the past few weeks after using antibiotics.  We discussed the importance of well-balanced diet and probiotics.  She has noticed some body aches over the last month as well.  She questioned whether this was related to her statin use.  She has not had any recent changes in her medication and it does not appear that statins are the cause of her body aches.  She is limiting her physical activity due to her joint and back pain.  We reviewed the importance  of increased physical activity and I will have her resume her chair exercises.  Her blood pressure initially today is 150/80 and on recheck is 128/76.  I will have her maintain a blood pressure log.  We will plan follow-up in 6/9 months.  Today she denies chest pain, shortness of breath, lower extremity edema, fatigue, palpitations, melena, hematuria, hemoptysis, diaphoresis, weakness, presyncope, syncope, orthopnea, and PND.   Home Medications    Prior to Admission medications   Medication Sig Start Date End Date Taking? Authorizing Provider  Accu-Chek Softclix Lancets lancets Use to check blood sugar 3 times daily. 08/27/19   Ladell Pier, MD  albuterol (VENTOLIN HFA) 108 (90 Base) MCG/ACT inhaler Inhale 2 puffs into the lungs every 6 (six) hours as needed for wheezing or shortness of breath. 11/12/19   Ladell Pier, MD  amLODipine (NORVASC) 10 MG tablet TAKE 1 TABLET (10 MG TOTAL) BY MOUTH DAILY. 07/29/20 07/29/21  Ladell Pier, MD  aspirin EC 81 MG tablet Take 1 tablet (81 mg total) daily by mouth. 04/18/17   Ladell Pier, MD  atorvastatin (LIPITOR) 40 MG tablet TAKE 1 TABLET DAILY BY MOUTH.  09/29/20 09/29/21  Ladell Pier, MD  Blood Glucose Monitoring Suppl (ACCU-CHEK GUIDE) w/Device KIT 1 kit by Does not apply route in the morning, at noon, and at bedtime. Use to check blood sugar 3 times daily. 08/27/19   Ladell Pier, MD  Bromfenac Sodium (PROLENSA) 0.07 % SOLN Place 1 drop into both eyes 4 (four) times daily. 03/23/19   Bernarda Caffey, MD  butalbital-acetaminophen-caffeine (FIORICET) [phone] MG tablet Take 1 tablet by mouth every 6 (six) hours as needed for headache. 12/21/19   Marcial Pacas, MD  carvedilol (COREG) 25 MG tablet TAKE 1 TABLET (25 MG TOTAL) BY MOUTH 2 (TWO) TIMES DAILY WITH A MEAL. 09/16/20 09/16/21  Ladell Pier, MD  empagliflozin (JARDIANCE) 10 MG TABS tablet TAKE 1 TABLET (10 MG TOTAL) BY MOUTH DAILY. 03/28/20 03/28/21  Ladell Pier, MD  fluconazole (DIFLUCAN) 150 MG tablet Take 150 mg by mouth once. 06/11/20   [provider]  glucose blood (ACCU-CHEK GUIDE) test strip Use to check blood sugar 3 times daily. 08/27/19   Ladell Pier, MD  insulin aspart (NOVOLOG) 100 UNIT/ML injection Inject 28 Units into the skin 3 (three) times daily before meals. 12/18/19   Ladell Pier, MD  insulin glargine (LANTUS) 100 UNIT/ML Solostar Pen INJECT 58 UNITS INTO THE SKIN DAILY. 03/28/20 03/28/21  Ladell Pier, MD  Insulin Syringe-Needle U-100 (BD INSULIN SYRINGE) 29G X 1/2" 0.3 ML MISC Use to inject Novolog TID. 05/07/20   Ladell Pier, MD  Insulin Syringe-Needle U-100 31G X 15/64" 0.3 ML MISC USE TO INJECT NOVOLOG THREE TIMES DAILY 05/07/20 05/07/21  Ladell Pier, MD  isosorbide mononitrate (IMDUR) 60 MG 24 hr tablet TAKE 1 TABLET (60 MG TOTAL) BY MOUTH DAILY. 02/14/20 02/13/21  Buford Dresser, MD  ketorolac (ACULAR) 0.5 % ophthalmic solution Place 1 drop into both eyes 4 (four) times daily. 09/23/20 09/23/21  Bernarda Caffey, MD  nitroGLYCERIN (NITROSTAT) 0.4 MG SL tablet Place 1 tab under tongue for chest pain.  May repeat after 5  minutes x 2.  DO NOT TAKE MORE THAN 3 TABS DURING AN EPISODE OF CHEST PAIN Patient taking differently: Place 0.4 mg under the tongue every 5 (five) minutes as needed for chest pain. DO NOT TAKE MORE THAN 3 TABS DURING AN EPISODE OF CHEST PAIN 09/18/18  Buford Dresser, MD  oxybutynin (DITROPAN-XL) 5 MG 24 hr tablet Take 1 tablet (5 mg total) by mouth at bedtime. 09/16/20   Ladell Pier, MD  potassium chloride (KLOR-CON) 10 MEQ tablet TAKE 1 TABLET BY MOUTH EVERY DAY 06/19/20   Ladell Pier, MD  prednisoLONE acetate (PRED FORTE) 1 % ophthalmic suspension Place 1 drop into both eyes 4 (four) times daily. 09/23/20   Bernarda Caffey, MD  topiramate (TOPAMAX) 50 MG tablet Take 1 tablet (50 mg total) by mouth 2 (two) times daily. 12/21/19   Marcial Pacas, MD  TRUEPLUS PEN NEEDLES 32G X 4 MM MISC USE AS DIRECTED WITH INSULIN 10/30/19   Ladell Pier, MD    Family History    Family History  Problem Relation Age of Onset   Heart disease Mother    Hypertension Mother    Diabetes Mother    Cancer Father        hodgkins lymphoma   Heart disease Sister        heart attack   Diabetes Sister    Colon cancer Brother 57   Diabetes Maternal Aunt    Diabetes Maternal Uncle    Cancer Other        parent   Diabetes Other        parent   Heart disease Other        parent   Hyperlipidemia Other        parent   Hypertension Other        parent   Arthritis Other        parent   Breast cancer Neg Hx    Colon polyps Neg Hx    Esophageal cancer Neg Hx    Rectal cancer Neg Hx    Stomach cancer Neg Hx    She indicated that her mother is deceased. She indicated that her father is deceased. She indicated that her sister is alive. She indicated that the status of her brother is unknown. She indicated that the status of her maternal aunt is unknown. She indicated that the status of her maternal uncle is unknown. She indicated that the status of her neg hx is unknown. She indicated that the  status of her other is unknown.   Social History    Social History   Socioeconomic History   Marital status: Single    Spouse name: Not on file   Number of children: 2   Years of education: 12   Highest education level: GED or equivalent  Occupational History   Occupation: retired  Tobacco Use   Smoking status: Never   Smokeless tobacco: Never  Vaping Use   Vaping Use: Never used  Substance and Sexual Activity   Alcohol use: Not Currently   Drug use: Not Currently    Types: Marijuana    Comment: every 2 weeks per pt   Sexual activity: Not Currently  Other Topics Concern   Not on file  Social History Narrative   Lives at home with her grandson.   Right-handed.   No daily caffeine use.   Social Determinants of Health   Financial Resource Strain: Low Risk  (10/22/2021)   Overall Financial Resource Strain (CARDIA)    Difficulty of Paying Living Expenses: Not very hard  Food Insecurity: Food Insecurity Present (10/22/2021)   Hunger Vital Sign    Worried About Running Out of Food in the Last Year: Sometimes true    Ran Out of Food in the Last Year: Sometimes  true  Transportation Needs: No Transportation Needs (10/22/2021)   PRAPARE - Hydrologist (Medical): No    Lack of Transportation (Non-Medical): No  Physical Activity: Inactive (10/22/2021)   Exercise Vital Sign    Days of Exercise per Week: 0 days    Minutes of Exercise per Session: 0 min  Stress: No Stress Concern Present (10/22/2021)   [place]    Feeling of Stress : Only a little  Social Connections: Socially Isolated (10/22/2021)   Social Connection and Isolation Panel [NHANES]    Frequency of Communication with Friends and Family: More than three times a week    Frequency of Social Gatherings with Friends and Family: Twice a week    Attends Religious Services: Never    Marine scientist or Organizations: No     Attends Archivist Meetings: Never    Marital Status: Never married  Intimate Partner Violence: Not At Risk (06/16/2017)   Humiliation, Afraid, Rape, and Kick questionnaire    Fear of Current or Ex-Partner: No    Emotionally Abused: No    Physically Abused: No    Sexually Abused: No     Review of Systems    General:  No chills, fever, night sweats or weight changes.  Cardiovascular:  No chest pain, dyspnea on exertion, edema, orthopnea, palpitations, paroxysmal nocturnal dyspnea. Dermatological: No rash, lesions/masses Respiratory: No cough, dyspnea Urologic: No hematuria, dysuria Abdominal:   No nausea, vomiting, diarrhea, bright red blood per rectum, melena, or hematemesis Neurologic:  No visual changes, wkns, changes in mental status. All other systems reviewed and are otherwise negative except as noted above.  Physical Exam    VS:  BP 128/76   Pulse 68   Ht 5' 4"  (1.626 m)   Wt (!) 302 lb (137 kg)   SpO2 98%   BMI 51.84 kg/m  , BMI Body mass index is 51.84 kg/m. GEN: Well nourished, well developed, in no acute distress. HEENT: normal. Neck: Supple, no JVD, carotid bruits, or masses. Cardiac: RRR, no murmurs, rubs, or gallops. No clubbing, cyanosis, edema.  Radials/DP/PT 2+ and equal bilaterally.  Respiratory:  Respirations regular and unlabored, clear to auscultation bilaterally. GI: Soft, nontender, nondistended, BS + x 4. MS: no deformity or atrophy. Skin: warm and dry, no rash. Neuro:  Strength and sensation are intact. Psych: Normal affect.  Accessory Clinical Findings    Recent Labs: 01/20/2021: ALT 17 01/30/2021: BUN 19; Creatinine, Ser 1.14; Sodium 142 10/07/2021: Potassium 3.6   Recent Lipid Panel    Component Value Date/Time   CHOL 131 09/01/2021 0949   TRIG 103 09/01/2021 0949   HDL 41 09/01/2021 0949   CHOLHDL 3.2 09/01/2021 0949   CHOLHDL 5 07/15/2015 1513   VLDL 44.0 (H) 07/15/2015 1513   LDLCALC 71 09/01/2021 0949   LDLDIRECT 93.0  07/15/2015 1513    ECG personally reviewed by me today-EKG today shows normal sinus rhythm possible left atrial enlargement 68 bpm  Echocardiogram 04/21/2020 1. Left ventricular ejection fraction, by estimation, is 50 to 55%. The  left ventricle has low normal function. The left ventricle demonstrates  regional wall motion abnormalities (see scoring diagram/findings for  description). Left ventricular diastolic   parameters are consistent with Grade I diastolic dysfunction (impaired  relaxation). Elevated left atrial pressure.   2. Right ventricular systolic function is normal. The right ventricular  size is normal.   3. Large pericardial effusion. The pericardial  effusion is  circumferential. There is no evidence of cardiac tamponade. Moderate  pleural effusion in the left lateral region.   4. The mitral valve is normal in structure. Mild mitral valve  regurgitation. No evidence of mitral stenosis.   5. The aortic valve is normal in structure. Aortic valve regurgitation is  not visualized. No aortic stenosis is present.   6. Aortic dilatation noted. There is mild dilatation of the ascending  aorta, measuring 40 mm.   7. The inferior vena cava is normal in size with greater than 50%  respiratory variability, suggesting right atrial pressure of 3 mmHg.   Comparison(s): Unchanged pericardial effusion from 09/06/2018.    CT coronary 09/29/19 Coronary calcium score: The patient's coronary artery calcium score is 350, which places the patient in the 97th percentile.   Coronary arteries: Normal coronary origins.  Right dominance.   Right Coronary Artery: Dominant. Mild 25-49% non-calcified plaque of the mid-vessel (CADRADS2).   Left Main Coronary Artery: Eccentric calcification with minimal 1-24% mixed stenosis (CADRADS1). Bifurcates into the LAD and LCx vessels.   Left Anterior Descending Coronary Artery: The LAD does not reach the apex and may be occluded in the distal portion.  There is moderate 50-69% (CADRADS3) mid-vessel non-calcified stenosis.   Left Circumflex Artery: Minimal distal 1-24% diffuse non-calcified stenosis. Smaller distal OM1 branch has calcified plaque with mild to moderate stenosis.   Aorta: Normal size, 37 mm at the mid ascending aorta (level of the PA bifurcation) measured double oblique. No calcifications. No dissection.   Aortic Valve: Trileaflet.  No calcifications.   Other findings:   Normal pulmonary vein drainage into the left atrium.   Normal left atrial appendage without a thrombus.   Dilated main pulmonary artery measuring 32 mm, suggestive of pulmonary hypertension.   Moderate sized circumferential pericardial effusion.   IMPRESSION: 1. Moderate non-calcified mid-LAD stenosis, possible distal LAD occlusion, CADRADS = 3. CT FFR will be performed and reported separately.   2. Coronary calcium score of 350. This was 97th percentile for age and sex matched control.   3. Normal coronary origin with right dominance.   4.  Moderate-sized circumferential pericardial effusion   5. Dilated main pulmonary artery to 32 mm, suggestive of pulmonary hypertension   6.  See radiology findings regarding lung parenchymal changes   Assessment & Plan   1.  Essential hypertension-BP today 128/76.  Has not been monitoring blood pressure at home. Continue carvedilol, amlodipine, Imdur Heart healthy low-sodium diet Increase physical activity as tolerated-instructions were chair exercises reviewed Maintain blood pressure log  Coronary artery disease-no chest pain today.  Underwent coronary CTA 09/29/2019 showed RCA 25-49 percent plaque, LAD 1-24% and 50-69%, left circumflex 1-24%.  Details above. Continue amlodipine, Imdur, nitroglycerin, aspirin, carvedilol Heart healthy low-sodium diet Increase physical activity as tolerated-continue chair exercises. Refill nitroglycerin  Hyperlipidemia-09/01/2021: Cholesterol, Total 131; HDL 41;  LDL Chol Calc (NIH) 71; Triglycerides 103 Continue atorvastatin, ezetimibe Continue fiber supplement Heart healthy low-sodium high-fiber diet Increase physical activity as tolerated Refill ezetimibe  Pericardial effusion-no recent episodes of chest discomfort.  Has been able to increase physical activity and denies exertional chest pain.    Previous ESR normal, CRP elevated. Continue to monitor.  Loose stools-reports over the last several weeks she has had loose stools following antibiotics use. May use probiotics Follow-up with PCP if no improvement over the next 1-2 weeks  Disposition: Follow-up with Dr. Harrell Gave in 6-9 months.   Jossie Ng. Deltha Bernales NP-C    12/09/2021, 10:11 AM  [place] [phone] Fax [phone]  Notice: This dictation was prepared with Dragon dictation along with smaller phrase technology. Any transcriptional errors that result from this process are unintentional and may not be corrected upon review.  I spent 14 minutes examining this patient, reviewing medications, and using patient centered shared decision making involving her cardiac care.  Prior to her visit I spent greater than 20 minutes reviewing her past medical history,  medications, and prior cardiac tests.

## 2021-12-09 ENCOUNTER — Encounter: Payer: Self-pay | Admitting: General Practice

## 2021-12-09 ENCOUNTER — Ambulatory Visit (INDEPENDENT_AMBULATORY_CARE_PROVIDER_SITE_OTHER): Payer: Medicare Other | Admitting: General Practice

## 2021-12-09 ENCOUNTER — Encounter (INDEPENDENT_AMBULATORY_CARE_PROVIDER_SITE_OTHER): Payer: Medicare Other | Admitting: Ophthalmology

## 2021-12-09 ENCOUNTER — Other Ambulatory Visit: Payer: Self-pay

## 2021-12-09 VITALS — BP 128/76 | HR 68 | Ht 64.0 in | Wt 302.0 lb

## 2021-12-09 DIAGNOSIS — I10 Essential (primary) hypertension: Secondary | ICD-10-CM

## 2021-12-09 DIAGNOSIS — H35033 Hypertensive retinopathy, bilateral: Secondary | ICD-10-CM

## 2021-12-09 DIAGNOSIS — I25118 Atherosclerotic heart disease of native coronary artery with other forms of angina pectoris: Secondary | ICD-10-CM | POA: Diagnosis not present

## 2021-12-09 DIAGNOSIS — Z961 Presence of intraocular lens: Secondary | ICD-10-CM

## 2021-12-09 DIAGNOSIS — E7800 Pure hypercholesterolemia, unspecified: Secondary | ICD-10-CM

## 2021-12-09 DIAGNOSIS — I2510 Atherosclerotic heart disease of native coronary artery without angina pectoris: Secondary | ICD-10-CM | POA: Diagnosis not present

## 2021-12-09 DIAGNOSIS — H35353 Cystoid macular degeneration, bilateral: Secondary | ICD-10-CM

## 2021-12-09 DIAGNOSIS — E113313 Type 2 diabetes mellitus with moderate nonproliferative diabetic retinopathy with macular edema, bilateral: Secondary | ICD-10-CM

## 2021-12-09 DIAGNOSIS — I3139 Other pericardial effusion (noninflammatory): Secondary | ICD-10-CM | POA: Diagnosis not present

## 2021-12-09 DIAGNOSIS — H40053 Ocular hypertension, bilateral: Secondary | ICD-10-CM

## 2021-12-09 MED ORDER — NITROGLYCERIN 0.4 MG SL SUBL
SUBLINGUAL_TABLET | SUBLINGUAL | 6 refills | Status: DC
  Filled 2021-12-09 – 2022-01-20 (×2): qty 25, 25d supply, fill #0
  Filled 2022-08-26: qty 25, 25d supply, fill #1
  Filled 2022-09-23: qty 25, 25d supply, fill #2
  Filled 2022-11-26 (×2): qty 25, 25d supply, fill #3

## 2021-12-09 MED ORDER — EZETIMIBE 10 MG PO TABS
10.0000 mg | ORAL_TABLET | Freq: Every day | ORAL | 2 refills | Status: DC
  Filled 2021-12-09 – 2022-01-20 (×2): qty 90, 90d supply, fill #0
  Filled 2022-01-20: qty 30, 30d supply, fill #0
  Filled 2022-03-10: qty 30, 30d supply, fill #1
  Filled 2022-05-07: qty 30, 30d supply, fill #2
  Filled 2022-06-18: qty 30, 30d supply, fill #3
  Filled 2022-08-02: qty 30, 30d supply, fill #4
  Filled 2022-08-26: qty 30, 30d supply, fill #5
  Filled 2022-11-09: qty 30, 30d supply, fill #6

## 2021-12-09 NOTE — Patient Instructions (Signed)
Medication Instructions:  EITHER PURCHASE A PROBIOTIC OR EAT ACTIVIA YOGURT, TAKE TO 2-3 WEEKS THEN USE/TAKE EVERY-OTHER-DAY.  *If you need a refill on your cardiac medications before your next appointment, please call your pharmacy*  Lab Work:   Testing/Procedures:  NONE    NONE If you have labs (blood work) drawn today and your tests are completely normal, you will receive your results only by: [place] (if you have MyChart) OR  A paper copy in the mail If you have any lab test that is abnormal or we need to change your treatment, we will call you to review the results.  Special Instructions IF YOUR LOOSE STOOLS LAST MORE THAN 1-2 WEEKS CALL YOUR PRIMARY MD TO DISCUSS  SEE ATTACHED CHAIR EXERCISES AND STRETCHING  TAKE AND LOG YOUR BLOOD PRESSURE AT LEAST WEEKLY  Follow-Up: Your next appointment:  9 month(s) In Person with Buford Dresser, MD ONLY  Please call our office 2 months in advance to schedule this appointment   At [facility], you and your health needs are our priority.  As part of our continuing mission to provide you with exceptional heart care, we have created designated Provider Care Teams.  These Care Teams include your primary Cardiologist (physician) and Advanced Practice Providers (APPs -  Physician Assistants and Nurse Practitioners) who all work together to provide you with the care you need, when you need it.  Important Information About Sugar     Exercises to do While Sitting  Exercises that you do while sitting (chair exercises) can give you many of the same benefits as full exercise. Benefits include strengthening your heart, burning calories, and keeping muscles and joints healthy. Exercise can also improve your mood and help with depression and anxiety. You may benefit from chair exercises if you are unable to do standing exercises due to: Diabetic foot pain. Obesity. Illness. Arthritis. Recovery from surgery or injury. Breathing  problems. Balance problems. Another type of disability. Before starting chair exercises, check with your health care provider or a physical therapist to find out how much exercise you can tolerate and which exercises are safe for you. If your health care provider approves: Start out slowly and build up over time. Aim to work up to about 10-20 minutes for each exercise session. Make exercise part of your daily routine. Drink water when you exercise. Do not wait until you are thirsty. Drink every 10-15 minutes. Stop exercising right away if you have pain, nausea, shortness of breath, or dizziness. If you are exercising in a wheelchair, make sure to lock the wheels. Ask your health care provider whether you can do tai chi or yoga. Many positions in these mind-body exercises can be modified to do while seated. Warm-up Before starting other exercises: Sit up as straight as you can. Have your knees bent at 90 degrees, which is the shape of the capital letter "L." Keep your feet flat on the floor. Sit at the front edge of your chair, if you can. Pull in (tighten) the muscles in your abdomen and stretch your spine and neck as straight as you can. Hold this position for a few minutes. Breathe in and out evenly. Try to concentrate on your breathing, and relax your mind. Stretching Exercise A: Arm stretch Hold your arms out straight in front of your body. Bend your hands at the wrist with your fingers pointing up, as if signaling someone to stop. Notice the slight tension in your forearms as you hold the position. Keeping  your arms out and your hands bent, rotate your hands outward as far as you can and hold this stretch. Aim to have your thumbs pointing up and your pinkie fingers pointing down. Slowly repeat arm stretches for one minute as tolerated. Exercise B: Leg stretch If you can move your legs, try to "draw" letters on the floor with the toes of your foot. Write your name with one foot. Write  your name with the toes of your other foot. Slowly repeat the movements for one minute as tolerated. Exercise C: Reach for the sky Reach your hands as far over your head as you can to stretch your spine. Move your hands and arms as if you are climbing a rope. Slowly repeat the movements for one minute as tolerated. Range of motion exercises Exercise A: Shoulder roll Let your arms hang loosely at your sides. Lift just your shoulders up toward your ears, then let them relax back down. When your shoulders feel loose, rotate your shoulders in backward and forward circles. Do shoulder rolls slowly for one minute as tolerated. Exercise B: March in place As if you are marching, pump your arms and lift your legs up and down. Lift your knees as high as you can. If you are unable to lift your knees, just pump your arms and move your ankles and feet up and down. March in place for one minute as tolerated. Exercise C: Seated jumping jacks Let your arms hang down straight. Keeping your arms straight, lift them up over your head. Aim to point your fingers to the ceiling. While you lift your arms, straighten your legs and slide your heels along the floor to your sides, as wide as you can. As you bring your arms back down to your sides, slide your legs back together. If you are unable to use your legs, just move your arms. Slowly repeat seated jumping jacks for one minute as tolerated. Strengthening exercises Exercise A: Shoulder squeeze Hold your arms straight out from your body to your sides, with your elbows bent and your fists pointed at the ceiling. Keeping your arms in the bent position, move them forward so your elbows and forearms meet in front of your face. Open your arms back out as wide as you can with your elbows still bent, until you feel your shoulder blades squeezing together. Hold for 5 seconds. Slowly repeat the movements forward and backward for one minute as tolerated. Contact a  health care provider if: You have to stop exercising due to any of the following: Pain. Nausea. Shortness of breath. Dizziness. Fatigue. You have significant pain or soreness after exercising. Get help right away if: You have chest pain. You have difficulty breathing. These symptoms may represent a serious problem that is an emergency. Do not wait to see if the symptoms will go away. Get medical help right away. Call your local emergency services (911 in the [place] Do not drive yourself to the hospital. Summary Exercises that you do while sitting (chair exercises) can strengthen your heart, burn calories, and keep muscles and joints healthy. You may benefit from chair exercises if you are unable to do standing exercises due to diabetic foot pain, obesity, recovery from surgery or injury, or other conditions. Before starting chair exercises, check with your health care provider or a physical therapist to find out how much exercise you can tolerate and which exercises are safe for you. This information is not intended to replace advice given to you by your  health care provider. Make sure you discuss any questions you have with your health care provider. Document Revised: 07/13/2020 Document Reviewed: 07/13/2020 Else[employer] Patient Education  [place].

## 2021-12-15 ENCOUNTER — Other Ambulatory Visit: Payer: Self-pay

## 2021-12-24 ENCOUNTER — Other Ambulatory Visit: Payer: Self-pay

## 2021-12-25 ENCOUNTER — Other Ambulatory Visit: Payer: Self-pay

## 2021-12-28 ENCOUNTER — Other Ambulatory Visit: Payer: Self-pay | Admitting: Internal Medicine

## 2021-12-28 DIAGNOSIS — Z1231 Encounter for screening mammogram for malignant neoplasm of breast: Secondary | ICD-10-CM

## 2021-12-31 ENCOUNTER — Telehealth (HOSPITAL_BASED_OUTPATIENT_CLINIC_OR_DEPARTMENT_OTHER): Payer: Medicaid Other | Admitting: Internal Medicine

## 2021-12-31 ENCOUNTER — Ambulatory Visit: Payer: Medicare Other | Attending: Internal Medicine | Admitting: Internal Medicine

## 2021-12-31 DIAGNOSIS — E1169 Type 2 diabetes mellitus with other specified complication: Secondary | ICD-10-CM

## 2021-12-31 DIAGNOSIS — I152 Hypertension secondary to endocrine disorders: Secondary | ICD-10-CM | POA: Diagnosis not present

## 2021-12-31 DIAGNOSIS — E1159 Type 2 diabetes mellitus with other circulatory complications: Secondary | ICD-10-CM | POA: Diagnosis not present

## 2021-12-31 DIAGNOSIS — R3915 Urgency of urination: Secondary | ICD-10-CM

## 2021-12-31 NOTE — Progress Notes (Signed)
Opened in error

## 2021-12-31 NOTE — Progress Notes (Signed)
[facility] Note  01/01/2022     CHIEF COMPLAINT Patient presents for Retina Follow Up   HISTORY OF PRESENT ILLNESS: Kimberly Frazier is a 61 y.o. female who presents to the clinic today for:   HPI     Retina Follow Up   Patient presents with  Diabetic Retinopathy.  In both eyes.  This started 7 weeks ago.  I, the attending physician,  performed the HPI with the patient and updated documentation appropriately.        Comments   Patient here for 4 weeks (7 weeks) for retina follow up for NPDR OU. Patient states vision about the same. It hurts around eyes.      Last edited by Bernarda Caffey, MD on 01/01/2022  9:27 AM.     Patient states vision is stable, has reduced Prednisolone drop down to once a day  Referring physician: Ladell Pier, MD [place],  [place] [address]  HISTORICAL INFORMATION:   Selected notes from the MEDICAL RECORD NUMBER Referred by Dr. Rutherford Guys for concern of CME    CURRENT MEDICATIONS: Current Outpatient Medications (Ophthalmic Drugs)  Medication Sig   brimonidine (ALPHAGAN) 0.2 % ophthalmic solution Place 1 drop into both eyes 2 (two) times daily.   dorzolamide-timolol (COSOPT) 22.3-6.8 MG/ML ophthalmic solution Place 1 drop into both eyes 2 (two) times daily.   ketorolac (ACULAR) 0.5 % ophthalmic solution Place 1 drop into both eyes 4 (four) times daily.   prednisoLONE acetate (PRED FORTE) 1 % ophthalmic suspension Place 1 drop into both eyes 4 (four) times daily.   No current facility-administered medications for this visit. (Ophthalmic Drugs)   Current Outpatient Medications (Other)  Medication Sig   Accu-Chek Softclix Lancets lancets Use to check blood sugar 3 times daily.   amLODipine (NORVASC) 10 MG tablet TAKE 1 TABLET (10 MG TOTAL) BY MOUTH DAILY.   aspirin EC 81 MG tablet Take 1 tablet (81 mg total) daily by mouth.   atorvastatin (LIPITOR) 80 MG tablet Take 1 tablet (80 mg total)  by mouth once daily. (PLEASE SCHEDULE AN APPT FOR FUTURE REFILLS)   Blood Glucose Monitoring Suppl (ACCU-CHEK GUIDE) w/Device KIT 1 kit by Does not apply route in the morning, at noon, and at bedtime. Use to check blood sugar 3 times daily.   carvedilol (COREG) 25 MG tablet TAKE 1 TABLET (25 MG TOTAL) BY MOUTH 2 (TWO) TIMES DAILY WITH A MEAL.   ciprofloxacin (CIPRO) 500 MG tablet Take 1 tablet (500 mg total) by mouth 2 (two) times daily.   Dulaglutide (TRULICITY) 6.07 PX/1.0GY SOPN Inject 0.75 mg into the skin once a week.   ezetimibe (ZETIA) 10 MG tablet Take 1 tablet (10 mg total) by mouth daily.   glucose blood (ACCU-CHEK GUIDE) test strip Use to check blood sugar 3 times daily.   insulin aspart (NOVOLOG) 100 UNIT/ML injection Inject 28 Units into the skin 3 (three) times daily before meals.   insulin glargine (LANTUS SOLOSTAR) 100 UNIT/ML Solostar Pen INJECT 58 UNITS INTO THE SKIN DAILY.   Insulin Pen Needle (BD PEN NEEDLE NANO U/F) 32G X 4 MM MISC Use as directed.   Insulin Syringe-Needle U-100 (BD VEO INSULIN SYRINGE U/F) 31G X 15/64" 0.3 ML MISC USE TO INJECT NOVOLOG THREE TIMES DAILY   isosorbide mononitrate (IMDUR) 60 MG 24 hr tablet TAKE 1 TABLET (60 MG TOTAL) BY MOUTH DAILY.   Na Sulfate-K Sulfate-Mg Sulf 17.5-3.13-1.6 GM/177ML SOLN Suprep (  no substitutions)-TAKE AS DIRECTED.   nitroGLYCERIN (NITROSTAT) 0.4 MG SL tablet Place 1 tab under tongue for chest pain.  May repeat after 5 minutes x 2.  DO NOT TAKE MORE THAN 3 TABS DURING AN EPISODE OF CHEST PAIN   potassium chloride (KLOR-CON) 10 MEQ tablet Take 2 tablets (20 mEq total) by mouth daily.   No current facility-administered medications for this visit. (Other)   REVIEW OF SYSTEMS: ROS   Positive for: Endocrine, Cardiovascular, Eyes Negative for: Constitutional, Gastrointestinal, Neurological, Skin, Genitourinary, Musculoskeletal, HENT, Respiratory, Psychiatric, Allergic/Imm, Heme/Lymph Last edited by Theodore Demark, COA on  01/01/2022  9:12 AM.     ALLERGIES Allergies  Allergen Reactions   Jardiance [Empagliflozin] Other (See Comments)    Frequent yeast infection   PAST MEDICAL HISTORY Past Medical History:  Diagnosis Date   Adrenal adenoma, left    Arthritis    Cancer ([place])    CKD (chronic kidney disease), stage II    Coronary artery disease 07-28-2018 followed [facility])  currently due to no insurance   per cardiac cath 02-06-2015 (positive mild lateral ishcemia on stress test)--- dLAD 80%,  mLAD 40%,  mPDA 80% (small vessel),  ostial D1 70%,  CFx with lumial irregarlities-- medical management   Depression    Diabetic neuropathy ([place])    Diabetic retinopathy ([place])    NPDR OU   Headache    History of Bell's palsy 07/2011   per pt residual facial pain on left side occasionally   History of cancer of vagina 1999   per pt completed radiation and chemo   History of sepsis 12/30/2017   positive blood culter fro E.coli   Hyperlipidemia    Hypertension    Hypertensive retinopathy    OU   Hypokalemia    Insulin dependent type 2 diabetes mellitus, uncontrolled    followed by pcp---  A1c was 11.7 on 06-15-2018 in epic   Myocardial infarction [facility])    10 yrs ago?   Nocturia    Peripheral neuropathy    PMB (postmenopausal bleeding)    Wears dentures    upper   Wears glasses    Past Surgical History:  Procedure Laterality Date   BREAST BIOPSY  2012   benign   CARDIAC CATHETERIZATION N/A 02/06/2015   Procedure: Left Heart Cath and Coronary Angiography;  Surgeon: Larey Dresser, MD;  Location: [facility];  Service: Cardiovascular;  Laterality: N/A;   CATARACT EXTRACTION Bilateral OD: 03/07/19, OS: 02/28/19   Dr. Gershon Crane   CATARACT EXTRACTION, BILATERAL     COLONOSCOPY     normal exam in [place], [place] 10-12 years ago   EYE SURGERY Bilateral OD: 03/07/19, OS: 02/28/19   Cat Sx - Dr. Gershon Crane   HYSTEROSCOPY WITH D & C N/A 08/01/2018   Procedure: DILATATION AND  CURETTAGE /HYSTEROSCOPY;  Surgeon: Mora Bellman, MD;  Location: [facility];  Service: Gynecology;  Laterality: N/A;   ROBOTIC ASSISTED TOTAL HYSTERECTOMY WITH BILATERAL SALPINGO OOPHERECTOMY N/A 11/28/2018   Procedure: XI ROBOTIC ASSISTED TOTAL HYSTERECTOMY WITH BILATERAL SALPINGO OOPHORECTOMY;  Surgeon: Everitt Amber, MD;  Location: [facility];  Service: Gynecology;  Laterality: N/A;   SENTINEL NODE BIOPSY N/A 11/28/2018   Procedure: SENTINEL NODE BIOPSY;  Surgeon: Everitt Amber, MD;  Location: [facility];  Service: Gynecology;  Laterality: N/A;   UMBILICAL HERNIA REPAIR  child   FAMILY HISTORY Family History  Problem Relation Age of Onset   Heart disease Mother  Hypertension Mother    Diabetes Mother    Cancer Father        hodgkins lymphoma   Heart disease Sister        heart attack   Diabetes Sister    Colon cancer Brother 26   Diabetes Maternal Aunt    Diabetes Maternal Uncle    Cancer Other        parent   Diabetes Other        parent   Heart disease Other        parent   Hyperlipidemia Other        parent   Hypertension Other        parent   Arthritis Other        parent   Breast cancer Neg Hx    Colon polyps Neg Hx    Esophageal cancer Neg Hx    Rectal cancer Neg Hx    Stomach cancer Neg Hx    SOCIAL HISTORY Social History   Tobacco Use   Smoking status: Never   Smokeless tobacco: Never  Vaping Use   Vaping Use: Never used  Substance Use Topics   Alcohol use: Not Currently   Drug use: Not Currently    Types: Marijuana    Comment: every 2 weeks per pt       OPHTHALMIC EXAM:  Base Eye Exam     Visual Acuity (Snellen - Linear)       Right Left   Dist [place] 20/20 20/350 +1   Dist ph [place]  20/250 -2         Tonometry (Tonopen, 9:08 AM)       Right Left   Pressure 18 18         Pupils       Dark Light Shape React APD   Right 3 2 Round Brisk None   Left 3 2 Round Brisk None         Visual Fields (Counting fingers)       Left  Right    Full Full         Extraocular Movement       Right Left    Full, Ortho Full, Ortho         Neuro/Psych     Oriented x3: Yes   Mood/Affect: Normal         Dilation     Both eyes: 1.0% Mydriacyl, 2.5% Phenylephrine @ 9:08 AM           Slit Lamp and Fundus Exam     Slit Lamp Exam       Right Left   Lids/Lashes Dermatochalasis - upper lid Dermatochalasis - upper lid   Conjunctiva/Sclera Mild Melanosis Mild Melanosis   Cornea 1+ Punctate epithelial erosions 2+ central punctate epithelial erosions, decreased TBUT, mild tear film debris, well healed temporal cataract wounds   Anterior Chamber Deep, 0.5+cell/pigment Deep, 0.5+cell/pigment   Iris Round and dilated, No NVI Round and dilated, No NVI   Lens PC IOL in good position with trace PCO PC IOL in good position with 1+PCO   Anterior Vitreous Vitreous syneresis Vitreous syneresis, Posterior vitreous detachment, vitreous condensations         Fundus Exam       Right Left   Disc Pallor, Sharp rim Compact, Pink and Sharp, temporal PPA   C/D Ratio 0.1 0.1   Macula Good foveal reflex, persistent cystic changes / edema inferior macula -- slightly improved, scattered Microaneurysms  Blunted foveal reflex, central edema - slightly improved, +exudates/MA/IRH -- greatest temporal macula -- improved   Vessels attenuated, Tortuous attenuated, Tortuous   Periphery Attached, scattered drusen, scattered DBH Attached, scattered DBH greatest posteriorly, mild peripheral drusen           IMAGING AND PROCEDURES  Imaging and Procedures for 01/01/2022  OCT, Retina - OU - Both Eyes       Right Eye Quality was good. Central Foveal Thickness: 266. Progression has improved. Findings include no SRF, abnormal foveal contour, intraretinal hyper-reflective material, intraretinal fluid, vitreomacular adhesion (Persistent IRF inferior macula and fovea -- improved).   Left Eye Quality was good. Central Foveal Thickness: 354.  Progression has improved. Findings include no SRF, abnormal foveal contour, subretinal hyper-reflective material, intraretinal hyper-reflective material, intraretinal fluid, outer retinal atrophy, vitreomacular adhesion (Mild interval improvement in IRF/IRHM inferior and temporal mac and fovea).   Notes *Images captured and stored on drive  Diagnosis / Impression:  Macular edema OU - likely combination of CME + DME OU OD: Persistent IRF inferior macula and fovea -- improved  OS: Mild interval improvement in IRF/IRHM inferior and temporal mac and fovea  Clinical management:  See below  Abbreviations: NFP - Normal foveal profile. CME - cystoid macular edema. PED - pigment epithelial detachment. IRF - intraretinal fluid. SRF - subretinal fluid. EZ - ellipsoid zone. ERM - epiretinal membrane. ORA - outer retinal atrophy. ORT - outer retinal tubulation. SRHM - subretinal hyper-reflective material      Intravitreal Injection, Pharmacologic Agent - OD - Right Eye       Time Out 01/01/2022. 9:33 AM. Confirmed correct patient, procedure, site, and patient consented.   Anesthesia Topical anesthesia was used. Anesthetic medications included Lidocaine 2%, Proparacaine 0.5%.   Procedure Preparation included 5% betadine to ocular surface, eyelid speculum. A (32g) needle was used.   Injection: 2 mg aflibercept 2 MG/0.05ML   Route: Intravitreal, Site: Right Eye   NDC: A3590391, Lot: [id number], Expiration date: 02/28/2023, Waste: 0 mL   Post-op Post injection exam found visual acuity of at least counting fingers. The patient tolerated the procedure well. There were no complications. The patient received written and verbal post procedure care education.      Intravitreal Injection, Pharmacologic Agent - OS - Left Eye       Time Out 01/01/2022. 9:33 AM. Confirmed correct patient, procedure, site, and patient consented.   Anesthesia Topical anesthesia was used. Anesthetic medications  included Lidocaine 2%, Proparacaine 0.5%.   Procedure Preparation included 5% betadine to ocular surface, eyelid speculum. A (32g) needle was used.   Injection: 6 mg faricimab-svoa 6 MG/0.05ML   Route: Intravitreal, Site: Left Eye   NDC: S6832610, Lot: [id number], Expiration date: 07/01/2023, Waste: 0 mL   Post-op Post injection exam found visual acuity of at least counting fingers. The patient tolerated the procedure well. There were no complications. The patient received written and verbal post procedure care education.            ASSESSMENT/PLAN:    ICD-10-CM   1. 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 CME (cystoid macular edema), bilateral  H35.353  3. Essential hypertension  I10     4. Hypertensive retinopathy of both eyes  H35.033     5. Pseudophakia of both eyes  Z96.1     6. Bilateral ocular hypertension  H40.053      1,2. Moderate Non-proliferative diabetic retinopathy, OU OD with combination DME/CME  - delayed f/u - 7 wks instead of 4  - last A1c 10.1 on 1.31.23             - h/o delayed to follow up from September 2021 to April 2022--7 mos instead of 4 wks**             - exam shows scattered IRH, edema and tortuous blood vessels OU             - FA (10.23.20) shows leaking MA OU and paracentral petaloid hyperfluorecence OD --> CME/[place]             - OCT shows diabetic macular edema OU; OD with CME/[place] component contributing as well             - s/p IVA OS #1 (10.23.20), #2 (11.18.20), #3 (12.18.20), #4 (02.24.21), #5 (03.24.21)             - s/p IVA OD #1 (11.06.20), #2 (12.18.20), #3  (02.24.21), #4 (03.24.21), #5 (04.21.21), #6 (5.26.21), #7 (07.26.21), #8 (08.25.21), #9 (09.23.21), #10 (04.26.22)             - s/p IVE OS #1 (04.21.21), #2 (5.26.21), #3 (07.26.21), #4 (08.25.21), #5 (09.23.21), #6 (04.26.22), #7 (05.25.22), #8 (07.01.22), #9 (07.29.22), #10 (08.29.22 - JDM), #11 (09.26.22), #12 (10.24.22)             - s/p IVE OD #1 (05.25.22), #2 (07.01.22), #3 (07.29.22), #4 (08.29.22 - JDM), #5 (09.26.22), #6 (10.24.22), #7 (11.21.22), #8 (12.19.22), #9 (01.23.23), #10 (02.20.23), #11 (03.22.23), #12 (04.19.23), #13 (05.17.23), #14 (06.14.23)             - s/p IV Vabysmo OS #1 (11.21.22, sample), #2 (12.19.22), #3 (01.23.23), #4 (02.20.23), #5 (03.22.23), #6 (04.19.23), #7 (05.17.23), #8 (06.14.23, sample)             - BCVA OD 20/20; OS 20/250 -- improved             - OCT today: OD: Persistent IRF inferior macula and fovea -- improved; OS: Mild interval improvement in IRF/IRHM inferior and temporal mac and fovea at 7 weeks             - recommend IVE OD #15 and IV Vabysmo OS #9 today, 08.04.23  - pt wishes to proceed w/ injections             - RBA of procedure discussed, questions answered             - informed consent obtained             - [place] informed consent form signed and scanned on 12.18.2020       - Eylea OU informed consent form re-signed and scanned on 05.17.23             - Vabysmo OS consent form signed and scanned on 11.21.22             - see procedure note             - Eylea4U benefits investigation started, 04.21.21             - cont Pred Forte qd OU, continue  Ketorolac QID OU             - f/u in 4 weeks, DFE, OCT, possible injection OU   3,4. Hypertensive retinopathy OU             - discussed importance of tight BP control             - monitor   5. Pseudophakia OU            - s/p CE/IOL OU Gershon Crane)                        OD: 03/07/2019                        OS: 02/28/2019             - IOLs in good position             - CME OU as  above             - monitor   6. Ocular Hypertension OU             - IOP: 18,18 OU - h/o steroid response             - cont cosopt BID OU and brimonidine BID OU  Ophthalmic Meds Ordered this visit:  Meds ordered this encounter  Medications   aflibercept (EYLEA) SOLN 2 mg   faricimab-svoa (VABYSMO) 2m/0.05mL intravitreal injection     Return in about 4 weeks (around 01/29/2022) for f/u NPDR OU, DFE, OCT.  There are no Patient Instructions on file for this visit.  Explained the diagnoses, plan, and follow up with the patient and they expressed understanding.  Patient expressed understanding of the importance of proper follow up care.   This document serves as a record of services personally performed by BGardiner Sleeper MD, PhD. It was created on their behalf by ASan Jetty BOwens Shark OA an ophthalmic technician. The creation of this record is the provider's dictation and/or activities during the visit.    Electronically signed by: ASan Jetty BOwens Shark O[place]08.03.2023 1:30 AM  BGardiner Sleeper M.D., Ph.D. Diseases & Surgery of the Retina and Vitreous [facility] I have reviewed the above documentation for accuracy and completeness, and I agree with the above. BGardiner Sleeper M.D., Ph.D. 01/02/22 1:37 AM  Abbreviations: M myopia (nearsighted); A astigmatism; H hyperopia (farsighted); P presbyopia; Mrx spectacle prescription;  CTL contact lenses; OD right eye; OS left eye; OU both eyes  XT exotropia; ET esotropia; PEK punctate epithelial keratitis; PEE punctate epithelial erosions; DES dry eye syndrome; MGD meibomian gland dysfunction; ATs artificial tears; PFAT's preservative free artificial tears; [place] Vill[place]uclear sclerotic cataract; PSC posterior subcapsular cataract; ERM epi-retinal membrane; PVD posterior vitreous detachment; RD retinal detachment; DM diabetes mellitus; DR diabetic retinopathy; NPDR non-proliferative diabetic retinopathy; PDR proliferative diabetic  retinopathy; CSME clinically significant macular edema; DME diabetic macular edema; dbh dot blot hemorrhages; CWS cotton wool spot; POAG primary open angle glaucoma; C/D cup-to-disc ratio; HVF humphrey visual field; GVF goldmann visual field; OCT optical coherence tomography; IOP intraocular pressure; BRVO Branch retinal vein occlusion; CRVO central retinal vein occlusion; CRAO central retinal artery occlusion; BRAO branch retinal artery occlusion; RT retinal tear; SB scleral buckle; PPV pars plana vitrectomy; VH Vitreous hemorrhage; PRP panretinal laser photocoagulation; IVK intravitreal kenalog; VMT vitreomacular traction; MH Macular hole;  NVD neovascularization of the disc; NVE  neovascularization elsewhere; AREDS age related eye disease study; ARMD age related macular degeneration; POAG primary open angle glaucoma; EBMD epithelial/anterior basement membrane dystrophy; ACIOL anterior chamber intraocular lens; IOL intraocular lens; PCIOL posterior chamber intraocular lens; Phaco/IOL phacoemulsification with intraocular lens placement; [place] photorefractive keratectomy; LASIK laser assisted in situ keratomileusis; HTN hypertension; DM diabetes mellitus; COPD chronic obstructive pulmonary disease

## 2021-12-31 NOTE — Progress Notes (Signed)
atient ID: Kimberly Frazier, female   DOB: Oct 01, 1960, 61 y.o.   MRN: [id number] Virtual Visit via Telephone Note This was supposed to be a video visit but patient had a difficult time trying to login to do the video visit so we decided to do it as a telephone visit. I connected with Kimberly Frazier on 12/31/2021 at 3:57 p.m by telephone and verified that I am speaking with the correct person using two identifiers  Location: Patient: home Provider: office  Participants: Myself Patient   I discussed the limitations, risks, security and privacy concerns of performing an evaluation and management service by telephone and the availability of in person appointments. I also discussed with the patient that there may be a patient responsible charge related to this service. The patient expressed understanding and agreed to proceed.   History of Present Illness: Patient with history of DM with neuropathy and retinopathy, HTN with hypertensive heart disease, CAD (Coronary CT revealed occlusion in the mid to distal LAD, distal RCA.  Cardiology states that she will need CABG eventually), HL, obesity, endometrial CA s/p hysterectomy  DM:  checks BS once a day after BF.  Gives range 120-130 Reports compliance with Lantus 58-60 units at nights, Novolog 26 units should be TID but reports she only takes with BF and supper.  Reports compliance with taking Trulicity once a wk -reports eating habits could be better but eats a lot of fruits and veggies.  Snacks on chips and ice cream.  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   Not getting in any exercise because her body aches all the time.  HTN:  Checks BP once a wk or less. BP today was 134/91, P81 Reports compliance with Coreg 25 mg BID, Norvasc 10 mg, and Imdur 60 mg daily.  I note that her blood pressure reading was good when she saw the cardiology PA on the 12th of last month.  Feels like she is getting a UTI again Has urinary urgency.  No dysuria. No fever   Outpatient Encounter  Medications as of 12/31/2021  Medication Sig   Accu-Chek Softclix Lancets lancets Use to check blood sugar 3 times daily.   amLODipine (NORVASC) 10 MG tablet TAKE 1 TABLET (10 MG TOTAL) BY MOUTH DAILY.   aspirin EC 81 MG tablet Take 1 tablet (81 mg total) daily by mouth.   atorvastatin (LIPITOR) 80 MG tablet Take 1 tablet (80 mg total) by mouth once daily. (PLEASE SCHEDULE AN APPT FOR FUTURE REFILLS)   Blood Glucose Monitoring Suppl (ACCU-CHEK GUIDE) w/Device KIT 1 kit by Does not apply route in the morning, at noon, and at bedtime. Use to check blood sugar 3 times daily.   brimonidine (ALPHAGAN) 0.2 % ophthalmic solution Place 1 drop into both eyes 2 (two) times daily.   carvedilol (COREG) 25 MG tablet TAKE 1 TABLET (25 MG TOTAL) BY MOUTH 2 (TWO) TIMES DAILY WITH A MEAL.   ciprofloxacin (CIPRO) 500 MG tablet Take 1 tablet (500 mg total) by mouth 2 (two) times daily.   dorzolamide-timolol (COSOPT) 22.3-6.8 MG/ML ophthalmic solution Place 1 drop into both eyes 2 (two) times daily.   Dulaglutide (TRULICITY) 7.59 FM/3.8GY SOPN Inject 0.75 mg into the skin once a week.   ezetimibe (ZETIA) 10 MG tablet Take 1 tablet (10 mg total) by mouth daily.   glucose blood (ACCU-CHEK GUIDE) test strip Use to check blood sugar 3 times daily.   insulin aspart (NOVOLOG) 100 UNIT/ML injection Inject 28 Units into the skin 3 (three) times daily  before meals.   insulin glargine (LANTUS SOLOSTAR) 100 UNIT/ML Solostar Pen INJECT 58 UNITS INTO THE SKIN DAILY.   Insulin Pen Needle (BD PEN NEEDLE NANO U/F) 32G X 4 MM MISC Use as directed.   Insulin Syringe-Needle U-100 (BD VEO INSULIN SYRINGE U/F) 31G X 15/64" 0.3 ML MISC USE TO INJECT NOVOLOG THREE TIMES DAILY   isosorbide mononitrate (IMDUR) 60 MG 24 hr tablet TAKE 1 TABLET (60 MG TOTAL) BY MOUTH DAILY.   ketorolac (ACULAR) 0.5 % ophthalmic solution Place 1 drop into both eyes 4 (four) times daily.   Na Sulfate-K Sulfate-Mg Sulf 17.5-3.13-1.6 GM/177ML SOLN Suprep (no  substitutions)-TAKE AS DIRECTED.   nitroGLYCERIN (NITROSTAT) 0.4 MG SL tablet Place 1 tab under tongue for chest pain.  May repeat after 5 minutes x 2.  DO NOT TAKE MORE THAN 3 TABS DURING AN EPISODE OF CHEST PAIN   potassium chloride (KLOR-CON) 10 MEQ tablet Take 2 tablets (20 mEq total) by mouth daily.   prednisoLONE acetate (PRED FORTE) 1 % ophthalmic suspension Place 1 drop into both eyes 4 (four) times daily.   No facility-administered encounter medications on file as of 12/31/2021.      Observations/Objective: No direct observation done as this was a telephone visit.  Assessment and Plan: 1. Type 2 diabetes mellitus with morbid obesity ([place]) Reported blood sugar readings after breakfast are at goal.  Encouraged her to take the NovoLog with all 3 meals.  Continue Trulicity and glargine insulin 58 to 60 units daily. Request that she comes to the lab to have A1c checked. Discussed and encourage healthy eating habits. - Hemoglobin A1c; Future  2. Hypertension associated with diabetes ([place]) Reported blood pressure reading today is not at goal.  We discussed having her follow-up with the clinical pharmacist for repeat check versus adding another antihypertensive.  Patient opted for follow-up with the clinical pharmacist.  She will continue current dose of Norvasc 10 mg daily Imdur 60 mg daily and carvedilol 25 mg daily.  Advised blood pressure goal is 130/80 or lower. - Comprehensive metabolic panel; Future  3. Urinary urgency - Urinalysis, Routine w reflex microscopic; Future   Follow Up Instructions: With clinical pharmacist in 2 weeks for blood pressure recheck. Follow-up with me in 1 month   I discussed the assessment and treatment plan with the patient. The patient was provided an opportunity to ask questions and all were answered. The patient agreed with the plan and demonstrated an understanding of the instructions.   The patient was advised to call back or seek an in-person  evaluation if the symptoms worsen or if the condition fails to improve as anticipated.  I  Spent 15 minutes on this telephone encounter  This note has been created with Surveyor, quantity. Any transcriptional errors are unintentional.  Karle Plumber, MD

## 2022-01-01 ENCOUNTER — Encounter (INDEPENDENT_AMBULATORY_CARE_PROVIDER_SITE_OTHER): Payer: Self-pay | Admitting: Ophthalmology

## 2022-01-01 ENCOUNTER — Ambulatory Visit (INDEPENDENT_AMBULATORY_CARE_PROVIDER_SITE_OTHER): Payer: Medicare Other | Admitting: Ophthalmology

## 2022-01-01 DIAGNOSIS — H35033 Hypertensive retinopathy, bilateral: Secondary | ICD-10-CM

## 2022-01-01 DIAGNOSIS — E113313 Type 2 diabetes mellitus with moderate nonproliferative diabetic retinopathy with macular edema, bilateral: Secondary | ICD-10-CM | POA: Diagnosis not present

## 2022-01-01 DIAGNOSIS — H40053 Ocular hypertension, bilateral: Secondary | ICD-10-CM

## 2022-01-01 DIAGNOSIS — Z961 Presence of intraocular lens: Secondary | ICD-10-CM | POA: Diagnosis not present

## 2022-01-01 DIAGNOSIS — I10 Essential (primary) hypertension: Secondary | ICD-10-CM

## 2022-01-01 DIAGNOSIS — H35353 Cystoid macular degeneration, bilateral: Secondary | ICD-10-CM

## 2022-01-01 MED ORDER — FARICIMAB-SVOA 6 MG/0.05ML IZ SOLN
6.0000 mg | INTRAVITREAL | Status: AC | PRN
  Administered 2022-01-01: 6 mg via INTRAVITREAL

## 2022-01-01 MED ORDER — AFLIBERCEPT 2MG/0.05ML IZ SOLN FOR KALEIDOSCOPE
2.0000 mg | INTRAVITREAL | Status: AC | PRN
  Administered 2022-01-01: 2 mg via INTRAVITREAL

## 2022-01-04 ENCOUNTER — Ambulatory Visit: Payer: Medicare Other | Attending: Internal Medicine

## 2022-01-04 DIAGNOSIS — I152 Hypertension secondary to endocrine disorders: Secondary | ICD-10-CM

## 2022-01-04 DIAGNOSIS — E1169 Type 2 diabetes mellitus with other specified complication: Secondary | ICD-10-CM

## 2022-01-04 DIAGNOSIS — E1159 Type 2 diabetes mellitus with other circulatory complications: Secondary | ICD-10-CM | POA: Diagnosis not present

## 2022-01-04 DIAGNOSIS — R3915 Urgency of urination: Secondary | ICD-10-CM

## 2022-01-05 ENCOUNTER — Other Ambulatory Visit: Payer: Self-pay | Admitting: Internal Medicine

## 2022-01-05 ENCOUNTER — Other Ambulatory Visit: Payer: Self-pay

## 2022-01-05 LAB — URINALYSIS, ROUTINE W REFLEX MICROSCOPIC
Bilirubin, UA: NEGATIVE
Ketones, UA: NEGATIVE
Nitrite, UA: NEGATIVE
RBC, UA: NEGATIVE
Specific Gravity, UA: 1.025 (ref 1.005–1.030)
Urobilinogen, Ur: 0.2 mg/dL (ref 0.2–1.0)
pH, UA: 6 (ref 5.0–7.5)

## 2022-01-05 LAB — COMPREHENSIVE METABOLIC PANEL WITH GFR
ALT: 21 IU/L (ref 0–32)
AST: 16 IU/L (ref 0–40)
Albumin/Globulin Ratio: 1.2 (ref 1.2–2.2)
Albumin: 4.1 g/dL (ref 3.9–4.9)
Alkaline Phosphatase: 139 IU/L — ABNORMAL HIGH (ref 44–121)
BUN/Creatinine Ratio: 17 (ref 12–28)
BUN: 23 mg/dL (ref 8–27)
Bilirubin Total: 0.6 mg/dL (ref 0.0–1.2)
CO2: 24 mmol/L (ref 20–29)
Calcium: 9.8 mg/dL (ref 8.7–10.3)
Chloride: 100 mmol/L (ref 96–106)
Creatinine, Ser: 1.33 mg/dL — ABNORMAL HIGH (ref 0.57–1.00)
Globulin, Total: 3.4 g/dL (ref 1.5–4.5)
Glucose: 376 mg/dL — ABNORMAL HIGH (ref 70–99)
Potassium: 3.9 mmol/L (ref 3.5–5.2)
Sodium: 139 mmol/L (ref 134–144)
Total Protein: 7.5 g/dL (ref 6.0–8.5)
eGFR: 45 mL/min/1.73 — ABNORMAL LOW

## 2022-01-05 LAB — MICROSCOPIC EXAMINATION
Casts: NONE SEEN /lpf
RBC, Urine: NONE SEEN /hpf (ref 0–2)
WBC, UA: >30 /hpf — AB (ref 0–5)

## 2022-01-05 LAB — HEMOGLOBIN A1C
Est. average glucose Bld gHb Est-mCnc: 229 mg/dL
Hgb A1c MFr Bld: 9.6 % — ABNORMAL HIGH (ref 4.8–5.6)

## 2022-01-05 MED ORDER — CIPROFLOXACIN HCL 500 MG PO TABS
500.0000 mg | ORAL_TABLET | Freq: Two times a day (BID) | ORAL | 0 refills | Status: DC
Start: 2022-01-05 — End: 2022-02-08
  Filled 2022-01-05: qty 14, 7d supply, fill #0

## 2022-01-07 ENCOUNTER — Ambulatory Visit: Payer: Medicare Other

## 2022-01-15 ENCOUNTER — Ambulatory Visit: Payer: Medicare Other | Admitting: Pharmacist

## 2022-01-18 ENCOUNTER — Other Ambulatory Visit: Payer: Self-pay

## 2022-01-18 ENCOUNTER — Other Ambulatory Visit: Payer: Self-pay | Admitting: Internal Medicine

## 2022-01-18 DIAGNOSIS — E1165 Type 2 diabetes mellitus with hyperglycemia: Secondary | ICD-10-CM

## 2022-01-19 ENCOUNTER — Other Ambulatory Visit: Payer: Self-pay

## 2022-01-19 MED ORDER — LANTUS SOLOSTAR 100 UNIT/ML ~~LOC~~ SOPN
PEN_INJECTOR | SUBCUTANEOUS | 3 refills | Status: DC
  Filled 2022-01-19: qty 15, 26d supply, fill #0
  Filled 2022-02-23: qty 15, 26d supply, fill #1
  Filled 2022-03-17: qty 15, 26d supply, fill #2
  Filled 2022-04-12: qty 15, 26d supply, fill #3

## 2022-01-19 NOTE — Telephone Encounter (Signed)
Requested Prescriptions  Pending Prescriptions Disp Refills  . insulin glargine (LANTUS SOLOSTAR) 100 UNIT/ML Solostar Pen 15 mL 3    Sig: INJECT 58 UNITS INTO THE SKIN DAILY.     Endocrinology:  Diabetes - Insulins Failed - 01/18/2022  7:30 AM      Failed - HBA1C is between 0 and 7.9 and within 180 days    HbA1c, POC (controlled diabetic range)  Date Value Ref Range Status  06/30/2021 10.1 (A) 0.0 - 7.0 % Final   Hgb A1c MFr Bld  Date Value Ref Range Status  01/04/2022 9.6 (H) 4.8 - 5.6 % Final    Comment:             Prediabetes: 5.7 - 6.4          Diabetes: >6.4          Glycemic control for adults with diabetes: <7.0          Passed - Valid encounter within last 6 months    Recent Outpatient Visits          2 weeks ago Type 2 diabetes mellitus with morbid obesity ([place])   [place] Ladell Pier, MD   2 weeks ago Erroneous encounter - disregard   [place], Deborah B, MD   6 months ago Type 2 diabetes mellitus with morbid obesity [facility])   [place], MD   8 months ago Subacute vaginitis   [place], MD   11 months ago Type 2 diabetes mellitus with morbid obesity [facility])   [place], MD      Future Appointments            In 2 weeks Ladell Pier, MD [place]

## 2022-01-20 ENCOUNTER — Other Ambulatory Visit: Payer: Self-pay

## 2022-01-21 ENCOUNTER — Other Ambulatory Visit: Payer: Self-pay

## 2022-01-25 NOTE — Progress Notes (Signed)
[facility] Note  01/29/2022     CHIEF COMPLAINT Patient presents for Retina Follow Up   HISTORY OF PRESENT ILLNESS: Kimberly Frazier is a 61 y.o. female who presents to the clinic today for:   HPI     Retina Follow Up   Patient presents with  Diabetic Retinopathy.  In both eyes.  This started 4 weeks ago.  I, the attending physician,  performed the HPI with the patient and updated documentation appropriately.        Comments   Patient here for 4 weeks retina follow up for NPDR OU. Patient states vision is ok. Eyes get sore feeling to them.       Last edited by Bernarda Caffey, MD on 01/29/2022 12:18 PM.    Patient states that the vision is the same.   Referring physician: Ladell Pier, MD [place],  [place] [id number]  HISTORICAL INFORMATION:   Selected notes from the MEDICAL RECORD NUMBER Referred by Dr. Rutherford Guys for concern of CME    CURRENT MEDICATIONS: Current Outpatient Medications (Ophthalmic Drugs)  Medication Sig   brimonidine (ALPHAGAN) 0.2 % ophthalmic solution Place 1 drop into both eyes 2 (two) times daily.   dorzolamide-timolol (COSOPT) 22.3-6.8 MG/ML ophthalmic solution Place 1 drop into both eyes 2 (two) times daily.   ketorolac (ACULAR) 0.5 % ophthalmic solution Place 1 drop into both eyes 4 (four) times daily.   prednisoLONE acetate (PRED FORTE) 1 % ophthalmic suspension Place 1 drop into both eyes 4 (four) times daily.   No current facility-administered medications for this visit. (Ophthalmic Drugs)   Current Outpatient Medications (Other)  Medication Sig   Accu-Chek Softclix Lancets lancets Use to check blood sugar 3 times daily.   amLODipine (NORVASC) 10 MG tablet TAKE 1 TABLET (10 MG TOTAL) BY MOUTH DAILY.   aspirin EC 81 MG tablet Take 1 tablet (81 mg total) daily by mouth.   atorvastatin (LIPITOR) 80 MG tablet Take 1 tablet (80 mg total) by mouth once daily. (PLEASE SCHEDULE AN APPT FOR  FUTURE REFILLS)   Blood Glucose Monitoring Suppl (ACCU-CHEK GUIDE) w/Device KIT 1 kit by Does not apply route in the morning, at noon, and at bedtime. Use to check blood sugar 3 times daily.   ciprofloxacin (CIPRO) 500 MG tablet Take 1 tablet (500 mg total) by mouth 2 (two) times daily.   Dulaglutide (TRULICITY) 6.01 UX/3.2TF SOPN Inject 0.75 mg into the skin once a week.   ezetimibe (ZETIA) 10 MG tablet Take 1 tablet (10 mg total) by mouth daily.   glucose blood (ACCU-CHEK GUIDE) test strip Use to check blood sugar 3 times daily.   insulin aspart (NOVOLOG) 100 UNIT/ML injection Inject 28 Units into the skin 3 (three) times daily before meals.   insulin glargine (LANTUS SOLOSTAR) 100 UNIT/ML Solostar Pen INJECT 58 UNITS INTO THE SKIN DAILY.   Insulin Pen Needle (BD PEN NEEDLE NANO U/F) 32G X 4 MM MISC Use as directed.   Insulin Syringe-Needle U-100 (BD VEO INSULIN SYRINGE U/F) 31G X 15/64" 0.3 ML MISC USE TO INJECT NOVOLOG THREE TIMES DAILY   isosorbide mononitrate (IMDUR) 60 MG 24 hr tablet TAKE 1 TABLET (60 MG TOTAL) BY MOUTH DAILY.   Na Sulfate-K Sulfate-Mg Sulf 17.5-3.13-1.6 GM/177ML SOLN Suprep (no substitutions)-TAKE AS DIRECTED.   nitroGLYCERIN (NITROSTAT) 0.4 MG SL tablet Place 1 tab under tongue for chest pain.  May repeat after 5 minutes x 2.  DO  NOT TAKE MORE THAN 3 TABS DURING AN EPISODE OF CHEST PAIN   potassium chloride (KLOR-CON) 10 MEQ tablet Take 2 tablets (20 mEq total) by mouth daily.   carvedilol (COREG) 25 MG tablet TAKE 1 TABLET (25 MG TOTAL) BY MOUTH 2 (TWO) TIMES DAILY WITH A MEAL.   No current facility-administered medications for this visit. (Other)   REVIEW OF SYSTEMS: ROS   Positive for: Endocrine, Cardiovascular, Eyes Negative for: Constitutional, Gastrointestinal, Neurological, Skin, Genitourinary, Musculoskeletal, HENT, Respiratory, Psychiatric, Allergic/Imm, Heme/Lymph Last edited by Theodore Demark, COA on 01/29/2022  9:17 AM.     ALLERGIES Allergies   Allergen Reactions   Jardiance [Empagliflozin] Other (See Comments)    Frequent yeast infection   PAST MEDICAL HISTORY Past Medical History:  Diagnosis Date   Adrenal adenoma, left    Arthritis    Cancer ([place])    CKD (chronic kidney disease), stage II    Coronary artery disease 07-28-2018 followed [facility]  currently due to no insurance   per cardiac cath 02-06-2015 (positive mild lateral ishcemia on stress test)--- dLAD 80%,  mLAD 40%,  mPDA 80% (small vessel),  ostial D1 70%,  CFx with lumial irregarlities-- medical management   Depression    Diabetic neuropathy ([place])    Diabetic retinopathy ([place])    NPDR OU   Headache    History of Bell's palsy 07/2011   per pt residual facial pain on left side occasionally   History of cancer of vagina 1999   per pt completed radiation and chemo   History of sepsis 12/30/2017   positive blood culter fro E.coli   Hyperlipidemia    Hypertension    Hypertensive retinopathy    OU   Hypokalemia    Insulin dependent type 2 diabetes mellitus, uncontrolled    followed by pcp---  A1c was 11.7 on 06-15-2018 in epic   Myocardial infarction [facility])    10 yrs ago?   Nocturia    Peripheral neuropathy    PMB (postmenopausal bleeding)    Wears dentures    upper   Wears glasses    Past Surgical History:  Procedure Laterality Date   BREAST BIOPSY  2012   benign   CARDIAC CATHETERIZATION N/A 02/06/2015   Procedure: Left Heart Cath and Coronary Angiography;  Surgeon: Larey Dresser, MD;  Location: [facility];  Service: Cardiovascular;  Laterality: N/A;   CATARACT EXTRACTION Bilateral OD: 03/07/19, OS: 02/28/19   Dr. Gershon Crane   CATARACT EXTRACTION, BILATERAL     COLONOSCOPY     normal exam in [place], [place] 10-12 years ago   EYE SURGERY Bilateral OD: 03/07/19, OS: 02/28/19   Cat Sx - Dr. Gershon Crane   HYSTEROSCOPY WITH D & C N/A 08/01/2018   Procedure: DILATATION AND CURETTAGE /HYSTEROSCOPY;  Surgeon: Mora Bellman,  MD;  Location: [place];  Service: Gynecology;  Laterality: N/A;   ROBOTIC ASSISTED TOTAL HYSTERECTOMY WITH BILATERAL SALPINGO OOPHERECTOMY N/A 11/28/2018   Procedure: XI ROBOTIC ASSISTED TOTAL HYSTERECTOMY WITH BILATERAL SALPINGO OOPHORECTOMY;  Surgeon: Everitt Amber, MD;  Location: [facility];  Service: Gynecology;  Laterality: N/A;   SENTINEL NODE BIOPSY N/A 11/28/2018   Procedure: SENTINEL NODE BIOPSY;  Surgeon: Everitt Amber, MD;  Location: [facility];  Service: Gynecology;  Laterality: N/A;   UMBILICAL HERNIA REPAIR  child   FAMILY HISTORY Family History  Problem Relation Age of Onset   Heart disease Mother    Hypertension Mother    Diabetes Mother  Cancer Father        hodgkins lymphoma   Heart disease Sister        heart attack   Diabetes Sister    Colon cancer Brother 50   Diabetes Maternal Aunt    Diabetes Maternal Uncle    Cancer Other        parent   Diabetes Other        parent   Heart disease Other        parent   Hyperlipidemia Other        parent   Hypertension Other        parent   Arthritis Other        parent   Breast cancer Neg Hx    Colon polyps Neg Hx    Esophageal cancer Neg Hx    Rectal cancer Neg Hx    Stomach cancer Neg Hx    SOCIAL HISTORY Social History   Tobacco Use   Smoking status: Never   Smokeless tobacco: Never  Vaping Use   Vaping Use: Never used  Substance Use Topics   Alcohol use: Not Currently   Drug use: Not Currently    Types: Marijuana    Comment: every 2 weeks per pt       OPHTHALMIC EXAM:  Base Eye Exam     Visual Acuity (Snellen - Linear)       Right Left   Dist [place] 20/20 20/350   Dist ph [place]  20/250 -1         Tonometry (Tonopen, 9:15 AM)       Right Left   Pressure 17 14         Pupils       Dark Light Shape React APD   Right 3 2 Round Brisk None   Left 3 2 Round Brisk None         Visual Fields (Counting fingers)       Left Right    Full Full         Extraocular Movement        Right Left    Full, Ortho Full, Ortho         Neuro/Psych     Oriented x3: Yes   Mood/Affect: Normal         Dilation     Both eyes: 1.0% Mydriacyl, 2.5% Phenylephrine @ 9:14 AM           Slit Lamp and Fundus Exam     Slit Lamp Exam       Right Left   Lids/Lashes Dermatochalasis - upper lid Dermatochalasis - upper lid   Conjunctiva/Sclera Mild Melanosis Mild Melanosis   Cornea 1+ Punctate epithelial erosions 2+ central punctate epithelial erosions, decreased TBUT, mild tear film debris, well healed temporal cataract wounds   Anterior Chamber Deep, 0.5+cell/pigment Deep, 0.5+cell/pigment   Iris Round and dilated, No NVI Round and dilated, No NVI   Lens PC IOL in good position with trace PCO PC IOL in good position with 1+PCO   Anterior Vitreous Vitreous syneresis Vitreous syneresis, Posterior vitreous detachment, vitreous condensations         Fundus Exam       Right Left   Disc Pallor, Sharp rim Compact, Pink and Sharp, temporal PPA   C/D Ratio 0.1 0.1   Macula Good foveal reflex, persistent cystic changes / edema inferior macula -- slightly improved, scattered Microaneurysms Blunted foveal reflex, central edema - slightly improved, +exudates/MA/IRH -- greatest  temporal macula -- improved   Vessels attenuated, Tortuous attenuated, Tortuous   Periphery Attached, scattered drusen, scattered DBH Attached, scattered DBH greatest posteriorly, mild peripheral drusen           IMAGING AND PROCEDURES  Imaging and Procedures for 01/29/2022  OCT, Retina - OU - Both Eyes       Right Eye Quality was good. Central Foveal Thickness: 269. Progression has been stable. Findings include no SRF, abnormal foveal contour, intraretinal hyper-reflective material, intraretinal fluid, vitreomacular adhesion (Persistent IRF inferior macula and fovea -- ? Slight improvement ).   Left Eye Quality was good. Central Foveal Thickness: 343. Progression has improved. Findings  include no SRF, abnormal foveal contour, subretinal hyper-reflective material, intraretinal hyper-reflective material, intraretinal fluid, outer retinal atrophy, vitreomacular adhesion (Mild interval improvement in IRF/IRHM inferior and temporal mac and fovea).   Notes *Images captured and stored on drive  Diagnosis / Impression:  Macular edema OU - likely combination of CME + DME OU OD: Persistent IRF inferior macula and fovea --? Slight improvement  OS: Mild interval improvement in IRF/IRHM inferior and temporal mac and fovea  Clinical management:  See below  Abbreviations: NFP - Normal foveal profile. CME - cystoid macular edema. PED - pigment epithelial detachment. IRF - intraretinal fluid. SRF - subretinal fluid. EZ - ellipsoid zone. ERM - epiretinal membrane. ORA - outer retinal atrophy. ORT - outer retinal tubulation. SRHM - subretinal hyper-reflective material      Intravitreal Injection, Pharmacologic Agent - OD - Right Eye       Time Out 01/29/2022. 10:18 AM. Confirmed correct patient, procedure, site, and patient consented.   Anesthesia Topical anesthesia was used. Anesthetic medications included Lidocaine 2%, Proparacaine 0.5%.   Procedure Preparation included 5% betadine to ocular surface, eyelid speculum. A (32g) needle was used.   Injection: 2 mg aflibercept 2 MG/0.05ML   Route: Intravitreal, Site: Right Eye   NDC: A3590391, Lot: [id number], Expiration date: 03/31/2023, Waste: 0 mL   Post-op Post injection exam found visual acuity of at least counting fingers. The patient tolerated the procedure well. There were no complications. The patient received written and verbal post procedure care education.      Intravitreal Injection, Pharmacologic Agent - OS - Left Eye       Time Out 01/29/2022. 10:19 AM. Confirmed correct patient, procedure, site, and patient consented.   Anesthesia Topical anesthesia was used. Anesthetic medications included Lidocaine 2%,  Proparacaine 0.5%.   Procedure Preparation included 5% betadine to ocular surface, eyelid speculum. A (32g) needle was used.   Injection: 6 mg faricimab-svoa 6 MG/0.05ML   Route: Intravitreal, Site: Left Eye   NDC: 37169-678-93, Lot: [id number], Expiration date: 10/29/2023, Waste: 0 mL   Post-op Post injection exam found visual acuity of at least counting fingers. The patient tolerated the procedure well. There were no complications. The patient received written and verbal post procedure care education.            ASSESSMENT/PLAN:    ICD-10-CM   1. Moderate nonproliferative diabetic retinopathy of both eyes with macular edema associated with type 2 diabetes mellitus (HCC)  E11.3313 OCT, Retina - OU - Both Eyes    Intravitreal Injection, Pharmacologic Agent - OD - Right Eye    Intravitreal Injection, Pharmacologic Agent - OS - Left Eye    faricimab-svoa (VABYSMO) 17m/0.05mL intravitreal injection    aflibercept (EYLEA) SOLN 2 mg    2. CME (cystoid macular edema), bilateral  H35.353     3. Essential hypertension  I10     4. Hypertensive retinopathy of both eyes  H35.033     5. Pseudophakia of both eyes  Z96.1     6. Bilateral ocular hypertension  H40.053      1,2. Moderate Non-proliferative diabetic retinopathy, OU OD with combination DME/CME  - delayed f/u - 7 wks instead of 4  - last A1c 10.1 on 1.31.23             - h/o delayed to follow up from September 2021 to April 2022--7 mos instead of 4 wks**             - exam shows scattered IRH, edema and tortuous blood vessels OU             - FA (10.23.20) shows leaking MA OU and paracentral petaloid hyperfluorecence OD --> CME/[place]             - OCT shows diabetic macular edema OU; OD with CME/[place] component contributing as well - s/p IVA OS #1 (10.23.20), #2 (11.18.20), #3 (12.18.20), #4 (02.24.21), #5 (03.24.21) - s/p IVA OD #1 (11.06.20), #2 (12.18.20), #3 (02.24.21), #4 (03.24.21), #5 (04.21.21), #6  (5.26.21), #7 (07.26.21), #8 (08.25.21), #9 (09.23.21), #10 (04.26.22) - s/p IVE OS #1 (04.21.21), #2 (5.26.21), #3 (07.26.21), #4 (08.25.21), #5 (09.23.21), #6 (04.26.22), #7 (05.25.22), #8 (07.01.22), #9 (07.29.22), #10 (08.29.22 - JDM), #11 (09.26.22), #12 (10.24.22) - s/p IVE OD #1 (05.25.22), #2 (07.01.22), #3 (07.29.22), #4 (08.29.22 - JDM), #5 (09.26.22), #6 (10.24.22), #7 (11.21.22), #8 (12.19.22), #9 (01.23.23), #10 (02.20.23), #11 (03.22.23), #12 (04.19.23), #13 (05.17.23), #14 (06.14.23), #15 (08.04.23) - s/p IV Vabysmo OS #1 (11.21.22, sample), #2 (12.19.22), #3 (01.23.23), #4 (02.20.23), #5 (03.22.23), #6 (04.19.23), #7 (05.17.23), #8 (06.14.23, sample), #9 (08.04.23) - BCVA OD 20/20; OS 20/250 -- improved - OCT today: OD: Persistent IRF inferior macula and fovea --? Slight improvement; OS: Mild interval improvement in IRF/IRHM inferior and temporal mac and fovea at 4 wks             - recommend IVE OD #16 and IV Vabysmo OS #10 today, 09.01.23  - pt wishes to proceed w/ injections             - RBA of procedure discussed, questions answered             - informed consent obtained             - [place] informed consent form signed and scanned on 12.18.2020       - Eylea OU informed consent form re-signed and scanned on 05.17.23             - Vabysmo OS consent form signed and scanned on 11.21.22             - see procedure note             - Eylea4U benefits investigation started, 04.21.21             - cont Pred Forte qd OU, continue Ketorolac QID OU             - f/u in 4 weeks, DFE, OCT, possible injection OU   3,4. Hypertensive retinopathy OU             - discussed importance of tight BP control             - continue to monitor   5. Pseudophakia OU            -  s/p CE/IOL OU Gershon Crane)                        OD: 03/07/2019                        OS: 02/28/2019             - IOLs in good position             - CME OU as above             - continue to monitor   6. Ocular  Hypertension OU             - IOP: 17,14 - h/o steroid response             - cont Cosopt BID OU and Brimonidine BID OU  Ophthalmic Meds Ordered this visit:  Meds ordered this encounter  Medications   faricimab-svoa (VABYSMO) 30m/0.05mL intravitreal injection   aflibercept (EYLEA) SOLN 2 mg     Return in about 4 weeks (around 02/26/2022) for f/u Mod NPDR , DFE, OCT, Possible injection OU .  There are no Patient Instructions on file for this visit.  Explained the diagnoses, plan, and follow up with the patient and they expressed understanding.  Patient expressed understanding of the importance of proper follow up care.   This document serves as a record of services personally performed by BGardiner Sleeper MD, PhD. It was created on their behalf by ASan Jetty BOwens Shark OA an ophthalmic technician. The creation of this record is the provider's dictation and/or activities during the visit.    Electronically signed by: ASan Jetty BOwens Shark ONew [place]08.28.2023 12:19 PM  This document serves as a record of services personally performed by BGardiner Sleeper MD, PhD. It was created on their behalf by CRenaldo Reel CLock[place]an ophthalmic technician. The creation of this record is the provider's dictation and/or activities during the visit.    Electronically signed by:  CRenaldo Reel COT  01/29/22 12:19 PM  BGardiner Sleeper M.D., Ph.D. Diseases & Surgery of the Retina and Vitreous [facility] I have reviewed the above documentation for accuracy and completeness, and I agree with the above. BGardiner Sleeper M.D., Ph.D. 01/29/22 12:22 PM  Abbreviations: M myopia (nearsighted); A astigmatism; H hyperopia (farsighted); P presbyopia; Mrx spectacle prescription;  CTL contact lenses; OD right eye; OS left eye; OU both eyes  XT exotropia; ET esotropia; PEK punctate epithelial keratitis; PEE punctate epithelial erosions; DES dry eye syndrome; MGD meibomian gland dysfunction; ATs  artificial tears; PFAT's preservative free artificial tears; NLancasternuclear sclerotic cataract; PSC posterior subcapsular cataract; ERM epi-retinal membrane; PVD posterior vitreous detachment; RD retinal detachment; DM diabetes mellitus; DR diabetic retinopathy; NPDR non-proliferative diabetic retinopathy; PDR proliferative diabetic retinopathy; CSME clinically significant macular edema; DME diabetic macular edema; dbh dot blot hemorrhages; CWS cotton wool spot; POAG primary open angle glaucoma; C/D cup-to-disc ratio; HVF humphrey visual field; GVF goldmann visual field; OCT optical coherence tomography; IOP intraocular pressure; BRVO Branch retinal vein occlusion; CRVO central retinal vein occlusion; CRAO central retinal artery occlusion; BRAO branch retinal artery occlusion; RT retinal tear; SB scleral buckle; PPV pars plana vitrectomy; VH Vitreous hemorrhage; PRP panretinal laser photocoagulation; IVK intravitreal kenalog; VMT vitreomacular traction; MH Macular hole;  NVD neovascularization of the disc; NVE neovascularization elsewhere; AREDS age related eye disease study; ARMD age related macular degeneration; POAG primary open angle  glaucoma; EBMD epithelial/anterior basement membrane dystrophy; ACIOL anterior chamber intraocular lens; IOL intraocular lens; PCIOL posterior chamber intraocular lens; Phaco/IOL phacoemulsification with intraocular lens placement; [place] photorefractive keratectomy; LASIK laser assisted in situ keratomileusis; HTN hypertension; DM diabetes mellitus; COPD chronic obstructive pulmonary disease

## 2022-01-29 ENCOUNTER — Ambulatory Visit (INDEPENDENT_AMBULATORY_CARE_PROVIDER_SITE_OTHER): Payer: Medicare Other | Admitting: Ophthalmology

## 2022-01-29 ENCOUNTER — Encounter (INDEPENDENT_AMBULATORY_CARE_PROVIDER_SITE_OTHER): Payer: Self-pay | Admitting: Ophthalmology

## 2022-01-29 DIAGNOSIS — I10 Essential (primary) hypertension: Secondary | ICD-10-CM | POA: Diagnosis not present

## 2022-01-29 DIAGNOSIS — Z961 Presence of intraocular lens: Secondary | ICD-10-CM

## 2022-01-29 DIAGNOSIS — H35033 Hypertensive retinopathy, bilateral: Secondary | ICD-10-CM

## 2022-01-29 DIAGNOSIS — E113313 Type 2 diabetes mellitus with moderate nonproliferative diabetic retinopathy with macular edema, bilateral: Secondary | ICD-10-CM

## 2022-01-29 DIAGNOSIS — H35353 Cystoid macular degeneration, bilateral: Secondary | ICD-10-CM | POA: Diagnosis not present

## 2022-01-29 DIAGNOSIS — H40053 Ocular hypertension, bilateral: Secondary | ICD-10-CM | POA: Diagnosis not present

## 2022-01-29 MED ORDER — AFLIBERCEPT 2MG/0.05ML IZ SOLN FOR KALEIDOSCOPE
2.0000 mg | INTRAVITREAL | Status: AC | PRN
  Administered 2022-01-29: 2 mg via INTRAVITREAL

## 2022-01-29 MED ORDER — FARICIMAB-SVOA 6 MG/0.05ML IZ SOLN
6.0000 mg | INTRAVITREAL | Status: AC | PRN
  Administered 2022-01-29: 6 mg via INTRAVITREAL

## 2022-02-08 ENCOUNTER — Other Ambulatory Visit: Payer: Self-pay

## 2022-02-08 ENCOUNTER — Encounter: Payer: Self-pay | Admitting: Internal Medicine

## 2022-02-08 ENCOUNTER — Other Ambulatory Visit: Payer: Self-pay | Admitting: Pharmacist

## 2022-02-08 ENCOUNTER — Ambulatory Visit: Payer: Medicare Other | Attending: Internal Medicine | Admitting: Internal Medicine

## 2022-02-08 DIAGNOSIS — F320 Major depressive disorder, single episode, mild: Secondary | ICD-10-CM

## 2022-02-08 DIAGNOSIS — Z23 Encounter for immunization: Secondary | ICD-10-CM | POA: Diagnosis not present

## 2022-02-08 DIAGNOSIS — N1832 Chronic kidney disease, stage 3b: Secondary | ICD-10-CM | POA: Diagnosis not present

## 2022-02-08 DIAGNOSIS — I152 Hypertension secondary to endocrine disorders: Secondary | ICD-10-CM | POA: Diagnosis not present

## 2022-02-08 DIAGNOSIS — M79601 Pain in right arm: Secondary | ICD-10-CM | POA: Diagnosis not present

## 2022-02-08 DIAGNOSIS — I25118 Atherosclerotic heart disease of native coronary artery with other forms of angina pectoris: Secondary | ICD-10-CM

## 2022-02-08 DIAGNOSIS — Z2821 Immunization not carried out because of patient refusal: Secondary | ICD-10-CM | POA: Diagnosis not present

## 2022-02-08 DIAGNOSIS — E1159 Type 2 diabetes mellitus with other circulatory complications: Secondary | ICD-10-CM | POA: Diagnosis not present

## 2022-02-08 DIAGNOSIS — E1169 Type 2 diabetes mellitus with other specified complication: Secondary | ICD-10-CM

## 2022-02-08 DIAGNOSIS — Z8542 Personal history of malignant neoplasm of other parts of uterus: Secondary | ICD-10-CM

## 2022-02-08 MED ORDER — DICLOFENAC SODIUM 1 % EX GEL
2.0000 g | Freq: Two times a day (BID) | CUTANEOUS | 1 refills | Status: DC | PRN
  Filled 2022-02-08: qty 100, 25d supply, fill #0

## 2022-02-08 MED ORDER — ACCU-CHEK GUIDE VI STRP
ORAL_STRIP | 6 refills | Status: AC
  Filled 2022-02-08: qty 100, 33d supply, fill #0
  Filled 2022-09-23: qty 100, 33d supply, fill #1
  Filled 2023-02-02: qty 100, 33d supply, fill #2

## 2022-02-08 MED ORDER — ZOSTER VAC RECOMB ADJUVANTED 50 MCG/0.5ML IM SUSR: 0.5000 mL | Freq: Once | INTRAMUSCULAR | 0 refills | Status: AC

## 2022-02-08 MED ORDER — FREESTYLE LIBRE 2 READER DEVI
0 refills | Status: AC
  Filled 2022-02-08: qty 1, 100d supply, fill #0

## 2022-02-08 MED ORDER — FREESTYLE LIBRE SENSOR SYSTEM MISC
12 refills | Status: AC
  Filled 2022-02-08: qty 2, 28d supply, fill #0

## 2022-02-08 MED ORDER — AMLODIPINE BESYLATE 10 MG PO TABS
ORAL_TABLET | Freq: Every day | ORAL | 1 refills | Status: DC
  Filled 2022-02-08: qty 90, 90d supply, fill #0
  Filled 2022-08-26: qty 90, 90d supply, fill #1

## 2022-02-08 MED ORDER — INSULIN ASPART 100 UNIT/ML IJ SOLN
INTRAMUSCULAR | 4 refills | Status: DC
  Filled 2022-02-08: qty 140, 3d supply, fill #0

## 2022-02-08 MED ORDER — INSULIN LISPRO (1 UNIT DIAL) 100 UNIT/ML (KWIKPEN)
28.0000 [IU] | PEN_INJECTOR | Freq: Two times a day (BID) | SUBCUTANEOUS | 2 refills | Status: DC
  Filled 2022-02-08: qty 15, 27d supply, fill #0
  Filled 2022-06-21: qty 15, 27d supply, fill #1
  Filled 2022-07-22: qty 15, 27d supply, fill #2

## 2022-02-08 MED ORDER — TRULICITY 1.5 MG/0.5ML ~~LOC~~ SOAJ
1.5000 mg | SUBCUTANEOUS | 6 refills | Status: DC
  Filled 2022-02-08: qty 2, 28d supply, fill #0
  Filled 2022-03-17: qty 2, 28d supply, fill #1
  Filled 2022-05-06: qty 2, 28d supply, fill #2
  Filled 2022-06-18: qty 2, 28d supply, fill #3
  Filled 2022-07-22: qty 2, 28d supply, fill #4

## 2022-02-08 NOTE — Progress Notes (Signed)
Patient ID: Kimberly Frazier, female    DOB: 1961-01-23  MRN: [id number]  CC: Medical Management of Chronic Issues   Subjective: Kimberly Frazier is a 61 y.o. female who presents for chronic ds management Her concerns today include:  Patient with history of DM with neuropathy and retinopathy, HTN with hypertensive heart disease, CAD (Coronary CT revealed occlusion in the mid to distal LAD, distal RCA.  Cardiology states that she will need CABG eventually), HL, obesity, endometrial CA s/p hysterectomy  DM/Obesity: checks BS 3-4x/wk.  Forgot to bring log.  Gives range 103-246.  Tolerating Trulicity 4.66 mg once a wk. Reports compliance with Lantus 58-60 units daily.  Uses Novolog twice a day instead of 3 times a day; sometimes she only eats twice a day and other days she may forgot to take 3rd shot.   -still drinks Coca Cola.  Drinks a 2 liter bottle in 2 days.  Snacks include chips, candy, water mellon and grapes  HTN/CAD/HL Currently taking: see medication list.  She reports compliance with carvedilol 25 mg twice a day, amlodipine 10 mg daily, isosorbide 60 mg daily, atorvastatin 80 mg daily, Zetia 10 mg daily and aspirin 81 mg daily. Med Adherence: _0  Yes   Medication side effects: _1  Yes    _2  No Adherence with salt restriction: _3  Yes    _4  No Home Monitoring?: _5  Yes    _6  No Monitoring Frequency: once every 2-3 wks Home BP results range: does not recall accurately SOB? _7  Yes with walking  Chest Pain?: _8  Yes    _9  No, no recent use of SL nitro Leg swelling?: _10  Yes -in ankles if she does a lot of standing Headaches?: _11  Yes -sometimes   _12  No Dizziness? _13  Yes    _14  No Comments:   CKD 3b/microalbumin:  not on NSAIDs.  There has been a decline in GFR from 54 to 45.  Most recent creatinine was 1.33.  She has mild microalbumin urea with the last level being 82 in January of this year.  Hx of endometrial CA:  Last saw Dr. Denman George 08/2020.  Pt at high to moderate recurrence risk.   Was suppose to f/u in 3 mths but did not.     Pain in RT upper between the elbow and the shoulder x 1 mth Constant and worse with movement of the shoulder/arm.   Does not recall any initiating factors. Hurts to lay on RT side Some pain at times in lateral aspect of RT leg as well.  Pos dep Screen: She tells me that she has mild depression that comes and goes.  No suicidal ideation at this time.  Does not feel she needs to be on medication or see a counselor at this time.    HM:  declines flu shot.  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   History of radiation therapy 11/28/2018   Diabetes mellitus ([place]) 10/06/2018   Type 2 diabetes mellitus with hyperglycemia, with long-term current use of insulin ([place]) 10/06/2018   Adrenal adenoma, left 06/15/2018   History of  cancer of vagina 06/15/2018   Postmenopausal vaginal bleeding 06/15/2018   Hyperkalemia    Diabetic peripheral neuropathy ([place]) 09/03/2016   CAD (coronary artery disease) 04/10/2015   Severe obesity (BMI >= 40) ([place]) 09/20/2013   Hyperhydrosis disorder 09/20/2013   Hyperlipidemia 10/25/2012   Diabetes mellitus type 2, uncontrolled, with complications 59/56/3875   Essential hypertension 07/26/2012     Current Outpatient Medications on File Prior to Visit  Medication Sig Dispense Refill   Accu-Chek Softclix Lancets lancets Use to check blood sugar 3 times daily. 100 each 6   aspirin EC 81 MG tablet Take 1 tablet (81 mg total) daily by mouth. 100 tablet 1    atorvastatin (LIPITOR) 80 MG tablet Take 1 tablet (80 mg total) by mouth once daily. (PLEASE SCHEDULE AN APPT FOR FUTURE REFILLS) 30 tablet 5   Blood Glucose Monitoring Suppl (ACCU-CHEK GUIDE) w/Device KIT 1 kit by Does not apply route in the morning, at noon, and at bedtime. Use to check blood sugar 3 times daily. 1 kit 0   brimonidine (ALPHAGAN) 0.2 % ophthalmic solution Place 1 drop into both eyes 2 (two) times daily. 10 mL 6   carvedilol (COREG) 25 MG tablet TAKE 1 TABLET (25 MG TOTAL) BY MOUTH 2 (TWO) TIMES DAILY WITH A MEAL. 60 tablet 2   dorzolamide-timolol (COSOPT) 22.3-6.8 MG/ML ophthalmic solution Place 1 drop into both eyes 2 (two) times daily. 10 mL 1   ezetimibe (ZETIA) 10 MG tablet Take 1 tablet (10 mg total) by mouth daily. 90 tablet 2   insulin glargine (LANTUS SOLOSTAR) 100 UNIT/ML Solostar Pen INJECT 58 UNITS INTO THE SKIN DAILY. 15 mL 3   Insulin Pen Needle (BD PEN NEEDLE NANO U/F) 32G X 4 MM MISC Use as directed. 100 each 1   Insulin Syringe-Needle U-100 (BD VEO INSULIN SYRINGE U/F) 31G X 15/64" 0.3 ML MISC USE TO INJECT NOVOLOG THREE TIMES DAILY 100 each 2   isosorbide mononitrate (IMDUR) 60 MG 24 hr tablet TAKE 1 TABLET (60 MG TOTAL) BY MOUTH DAILY. 90 tablet 3   ketorolac (ACULAR) 0.5 % ophthalmic solution Place 1 drop into both eyes 4 (four) times daily. 10 mL 6   Na Sulfate-K Sulfate-Mg Sulf 17.5-3.13-1.6 GM/177ML SOLN Suprep (no substitutions)-TAKE AS DIRECTED. 354 mL 0   nitroGLYCERIN (NITROSTAT) 0.4 MG SL tablet Place 1 tab under tongue for chest pain.  May repeat after 5 minutes x 2.  DO NOT TAKE MORE THAN 3 TABS DURING AN EPISODE OF CHEST PAIN 25 tablet 6   potassium chloride (KLOR-CON) 10 MEQ tablet Take 2 tablets (20 mEq total) by mouth daily. 60 tablet 5   prednisoLONE acetate (PRED FORTE) 1 % ophthalmic suspension Place 1 drop into both eyes 4 (four) times daily. 15 mL 4   No current facility-administered medications on file prior to visit.    Allergies  Allergen  Reactions   Jardiance [Empagliflozin] Other (See Comments)    Frequent yeast infection    Social History   Socioeconomic History   Marital status: Single    Spouse name: Not on file   Number of children: 2   Years of education: 12   Highest education level: GED or equivalent  Occupational History   Occupation: retired  Tobacco Use   Smoking status: Never   Smokeless tobacco: Never  Vaping Use   Vaping Use: Never used  Substance and Sexual Activity   Alcohol use: Not Currently   Drug use: Not Currently    Types: Marijuana  Comment: every 2 weeks per pt   Sexual activity: Not Currently  Other Topics Concern   Not on file  Social History Narrative   Lives at home with her grandson.   Right-handed.   No daily caffeine use.   Social Determinants of Health   Financial Resource Strain: Low Risk  (10/22/2021)   Overall Financial Resource Strain (CARDIA)    Difficulty of Paying Living Expenses: Not very hard  Food Insecurity: Food Insecurity Present (10/22/2021)   Hunger Vital Sign    Worried About Running Out of Food in the Last Year: Sometimes true    Ran Out of Food in the Last Year: Sometimes true  Transportation Needs: No Transportation Needs (10/22/2021)   PRAPARE - Hydrologist (Medical): No    Lack of Transportation (Non-Medical): No  Physical Activity: Inactive (10/22/2021)   Exercise Vital Sign    Days of Exercise per Week: 0 days    Minutes of Exercise per Session: 0 min  Stress: No Stress Concern Present (10/22/2021)   [place]    Feeling of Stress : Only a little  Social Connections: Socially Isolated (10/22/2021)   Social Connection and Isolation Panel [NHANES]    Frequency of Communication with Friends and Family: More than three times a week    Frequency of Social Gatherings with Friends and Family: Twice a week    Attends Religious Services: Never    Building surveyor or Organizations: No    Attends Archivist Meetings: Never    Marital Status: Never married  Intimate Partner Violence: Not At Risk (06/16/2017)   Humiliation, Afraid, Rape, and Kick questionnaire    Fear of Current or Ex-Partner: No    Emotionally Abused: No    Physically Abused: No    Sexually Abused: No    Family History  Problem Relation Age of Onset   Heart disease Mother    Hypertension Mother    Diabetes Mother    Cancer Father        hodgkins lymphoma   Heart disease Sister        heart attack   Diabetes Sister    Colon cancer Brother 58   Diabetes Maternal Aunt    Diabetes Maternal Uncle    Cancer Other        parent   Diabetes Other        parent   Heart disease Other        parent   Hyperlipidemia Other        parent   Hypertension Other        parent   Arthritis Other        parent   Breast cancer Neg Hx    Colon polyps Neg Hx    Esophageal cancer Neg Hx    Rectal cancer Neg Hx    Stomach cancer Neg Hx     Past Surgical History:  Procedure Laterality Date   BREAST BIOPSY  2012   benign   CARDIAC CATHETERIZATION N/A 02/06/2015   Procedure: Left Heart Cath and Coronary Angiography;  Surgeon: Larey Dresser, MD;  Location: [facility];  Service: Cardiovascular;  Laterality: N/A;   CATARACT EXTRACTION Bilateral OD: 03/07/19, OS: 02/28/19   Dr. Gershon Crane   CATARACT EXTRACTION, BILATERAL     COLONOSCOPY     normal exam in [place], [place] abour 10-12 years ago   EYE SURGERY Bilateral  OD: 03/07/19, OS: 02/28/19   Cat Sx - Dr. Gershon Crane   HYSTEROSCOPY WITH D & C N/A 08/01/2018   Procedure: DILATATION AND CURETTAGE /HYSTEROSCOPY;  Surgeon: Mora Bellman, MD;  Location: [facility];  Service: Gynecology;  Laterality: N/A;   ROBOTIC ASSISTED TOTAL HYSTERECTOMY WITH BILATERAL SALPINGO OOPHERECTOMY N/A 11/28/2018   Procedure: XI ROBOTIC ASSISTED TOTAL HYSTERECTOMY WITH BILATERAL SALPINGO OOPHORECTOMY;  Surgeon: Everitt Amber, MD;  Location: [facility];  Service: Gynecology;  Laterality: N/A;   SENTINEL NODE BIOPSY N/A 11/28/2018   Procedure: SENTINEL NODE BIOPSY;  Surgeon: Everitt Amber, MD;  Location: [facility];  Service: Gynecology;  Laterality: N/A;   UMBILICAL HERNIA REPAIR  child    ROS: Review of Systems Negative except as stated above  PHYSICAL EXAM: BP 123/79   Pulse 87   Temp 98.1 F (36.7 C) (Oral)   Ht _0  (1.626 m)   Wt (!) 304 lb 3.2 oz (138 kg)   SpO2 97%   BMI 52.22 kg/m   Wt Readings from Last 3 Encounters:  02/08/22 (!) 304 lb 3.2 oz (138 kg)  12/09/21 (!) 302 lb (137 kg)  06/30/21 296 lb (134.3 kg)    Physical Exam  General appearance - alert, well appearing, morbidly obese AAF and in no distress Mental status - normal mood, behavior, speech, dress, motor activity, and thought processes Chest - clear to auscultation, no wheezes, rales or rhonchi, symmetric air entry Heart - normal rate, regular rhythm, normal S1, S2, no murmurs, rubs, clicks or gallops Extremities - peripheral pulses normal, no pedal edema, no clubbing or cyanosis RT arm:  no edema or erythema.  Right shoulder has good range of motion and no tenderness on palpation of the joint.  Good range of motion at the right elbow.  Power in both upper extremities proximally and distally 5/5. Diabetic Foot Exam - Simple   Simple Foot Form Diabetic Foot exam was performed with the following findings: Yes 02/08/2022 12:44 PM  Visual Inspection No deformities, no ulcerations, no other skin breakdown bilaterally: Yes Sensation Testing Intact to touch and monofilament testing bilaterally: Yes Pulse Check Comments        02/08/2022   10:53 AM 10/22/2021   11:47 AM 06/30/2021    3:59 PM  Depression screen PHQ 2/9  Decreased Interest 0 1 2  Down, Depressed, Hopeless 0 0 2  PHQ - 2 Score 0 1 4  Altered sleeping 0  0  Tired, decreased energy 3  2  Change in appetite 2  0  Feeling bad or failure about yourself  1  0   Trouble concentrating 1  0  Moving slowly or fidgety/restless 0  0  Suicidal thoughts 0  0  PHQ-9 Score 7  6       Latest Ref Rng & Units 01/04/2022    9:06 AM 10/07/2021    9:08 AM 01/30/2021    3:11 PM  CMP  Glucose 70 - 99 mg/dL 376   117   BUN 8 - 27 mg/dL 23   19   Creatinine 0.57 - 1.00 mg/dL 1.33   1.14   Sodium 134 - 144 mmol/L 139   142   Potassium 3.5 - 5.2 mmol/L 3.9  3.6  3.7   Chloride 96 - 106 mmol/L 100   104   CO2 20 - 29 mmol/L 24   25   Calcium 8.7 - 10.3 mg/dL 9.8   9.2   Total Protein 6.0 - 8.5  g/dL 7.5     Total Bilirubin 0.0 - 1.2 mg/dL 0.6     Alkaline Phos 44 - 121 IU/L 139     AST 0 - 40 IU/L 16     ALT 0 - 32 IU/L 21      Lipid Panel     Component Value Date/Time   CHOL 131 09/01/2021 0949   TRIG 103 09/01/2021 0949   HDL 41 09/01/2021 0949   CHOLHDL 3.2 09/01/2021 0949   CHOLHDL 5 07/15/2015 1513   VLDL 44.0 (H) 07/15/2015 1513   LDLCALC 71 09/01/2021 0949   LDLDIRECT 93.0 07/15/2015 1513    CBC    Component Value Date/Time   WBC 10.9 (H) 03/24/2020 1427   RBC 4.50 03/24/2020 1427   HGB 12.7 03/24/2020 1427   HGB 13.4 06/15/2018 1056   HCT 39.4 03/24/2020 1427   HCT 40.3 06/15/2018 1056   PLT 247 03/24/2020 1427   PLT 229 06/15/2018 1056   MCV 87.6 03/24/2020 1427   MCV 88 06/15/2018 1056   MCH 28.2 03/24/2020 1427   MCHC 32.2 03/24/2020 1427   RDW 14.2 03/24/2020 1427   RDW 13.6 06/15/2018 1056   LYMPHSABS 1.0 12/30/2017 0812   MONOABS 1.3 (H) 12/30/2017 0812   EOSABS 0.1 12/30/2017 0812   BASOSABS 0.0 12/30/2017 0812    ASSESSMENT AND PLAN:  1. Type 2 diabetes mellitus with morbid obesity (HCC) Not at goal. Recommend increasing Trulicity to 1.5 mg once a week and changing the NovoLog to 28 units with the 2 largest meals of the day.  Continue Lantus 58 to 60 units daily. Dietary counseling given.  Encouraged her to eat fruits that have lower sugar content like apples, strawberries and berries.  Encouraged her to move more. -  Continuous Blood Gluc Sensor (FREESTYLE LIBRE SENSOR SYSTEM) MISC; Change sensor every 2 weeks  Dispense: 2 each; Refill: 12 - Continuous Blood Gluc Receiver (FREESTYLE LIBRE 2 READER) DEVI; Use as directed  Dispense: 1 each; Refill: 0 - glucose blood (ACCU-CHEK GUIDE) test strip; Use to check blood sugar 3 times daily.  Dispense: 100 each; Refill: 6 - insulin aspart (NOVOLOG) 100 UNIT/ML injection; Take 28 units Subcut with the two larges meals of the day  Dispense: 140 mL; Refill: 4 - Dulaglutide (TRULICITY) 1.5 ZO/1.0RU SOPN; Inject 1.5 mg into the skin once a week.  Dispense: 2 mL; Refill: 6  2. Hypertension associated with diabetes ([place]) At goal.  Continue amlodipine 10 mg daily, carvedilol 25 mg twice a day and isosorbide 60 mg daily - amLODipine (NORVASC) 10 MG tablet; TAKE 1 TABLET (10 MG TOTAL) BY MOUTH DAILY.  Dispense: 90 tablet; Refill: 1  3. Mild major depression ([place]) Patient reports this is intermittent.  Declines any medication or referral for counseling at this time.  No suicidal ideation.  4. Pain of right upper extremity Questionable tendinitis of bicep tendons.  Give a trial of Voltaren gel.  If no improvement she will let me know and we can refer to orthopedics. - diclofenac Sodium (VOLTAREN) 1 % GEL; Apply 2 g topically 2 (two) times daily as needed.  Dispense: 100 g; Refill: 1  5. Stage 3b chronic kidney disease (HCC) Avoid oral NSAIDs. Discussed the importance of good blood pressure and diabetes control to help preserve kidney function.  6. Coronary artery disease of native artery of native heart with stable angina pectoris [facility]) Patient with some shortness of breath on exertion which may be angina equivalent but likely due to  deconditioning.  Continue carvedilol, atorvastatin, Zetia and aspirin  7. History of endometrial cancer We will refer her back to Dr. Denman George for follow-up/monitoring  8. Influenza vaccination declined Commended.  Patient declined  9. Need  for shingles vaccine - Zoster Vaccine Adjuvanted [facility]) injection; Inject 0.5 mLs into the muscle once for 1 dose.  Dispense: 0.5 mL; Refill: 0    Patient was given the opportunity to ask questions.  Patient verbalized understanding of the plan and was able to repeat key elements of the plan.   This documentation was completed using Radio producer.  Any transcriptional errors are unintentional.  No orders of the defined types were placed in this encounter.    Requested Prescriptions   Signed Prescriptions Disp Refills   Continuous Blood Gluc Sensor (FREESTYLE LIBRE SENSOR SYSTEM) MISC 2 each 12    Sig: Change sensor every 2 weeks   Continuous Blood Gluc Receiver (FREESTYLE LIBRE 2 READER) DEVI 1 each 0    Sig: Use as directed   glucose blood (ACCU-CHEK GUIDE) test strip 100 each 6    Sig: Use to check blood sugar 3 times daily.   amLODipine (NORVASC) 10 MG tablet 90 tablet 1    Sig: TAKE 1 TABLET (10 MG TOTAL) BY MOUTH DAILY.   insulin aspart (NOVOLOG) 100 UNIT/ML injection 140 mL 4    Sig: Take 28 units Subcut with the two larges meals of the day   Dulaglutide (TRULICITY) 1.5 HY/8.5OY SOPN 2 mL 6    Sig: Inject 1.5 mg into the skin once a week.   diclofenac Sodium (VOLTAREN) 1 % GEL 100 g 1    Sig: Apply 2 g topically 2 (two) times daily as needed.   Zoster Vaccine Adjuvanted [facility]) injection 0.5 mL 0    Sig: Inject 0.5 mLs into the muscle once for 1 dose.    Return in about 4 months (around 06/10/2022) for Appt with [facility] in 4 wks for BS check.  Karle Plumber, MD, FACP

## 2022-02-09 ENCOUNTER — Other Ambulatory Visit: Payer: Self-pay

## 2022-02-09 ENCOUNTER — Ambulatory Visit: Payer: Self-pay | Admitting: Licensed Clinical Social Worker

## 2022-02-10 ENCOUNTER — Telehealth: Payer: Self-pay | Admitting: *Deleted

## 2022-02-10 NOTE — Telephone Encounter (Signed)
Called patient to schedule follow up visit.  Patient could only come on Tuesday, Wednesday or Thursday due to transportation issues.

## 2022-02-11 ENCOUNTER — Other Ambulatory Visit: Payer: Self-pay

## 2022-02-16 NOTE — Patient Outreach (Signed)
  Care Coordination   Initial Visit Note   02/16/2022 Name: Kimberly Frazier MRN: [id number] DOB: June 24, 1960  Kimberly Frazier is a 61 y.o. year old female who sees Ladell Pier, MD for primary care. I spoke with  [name] by phone today.  What matters to the patients health and wellness today?  Informed patient of Care Coordination services    Goals Addressed   None     SDOH assessments and interventions completed:  No     Care Coordination Interventions Activated:  Yes  Care Coordination Interventions:  Yes, provided   Follow up plan: No further intervention required.   Encounter Outcome:  Pt. Refused   Christa See, MSW, [email or web address] Phone [phone] 10:57 AM

## 2022-02-16 NOTE — Patient Instructions (Signed)
Visit Information  Thank you for taking time to visit with me today. Please don't hesitate to contact me if I can be of assistance to you.   Following are the goals we discussed today:   Goals Addressed   None     If you are experiencing a Mental Health or [place] or need someone to talk to, please call the Suicide and Crisis Lifeline: 988 call 911   Patient verbalizes understanding of instructions and care plan provided today and agrees to view in [place]. Active MyChart status and patient understanding of how to access instructions and care plan via MyChart confirmed with patient.     Christa See, MSW, [email or web address] Phone [phone] 10:58 AM

## 2022-02-23 ENCOUNTER — Other Ambulatory Visit: Payer: Self-pay

## 2022-03-02 ENCOUNTER — Encounter (INDEPENDENT_AMBULATORY_CARE_PROVIDER_SITE_OTHER): Payer: Medicare Other | Admitting: Ophthalmology

## 2022-03-02 DIAGNOSIS — H35353 Cystoid macular degeneration, bilateral: Secondary | ICD-10-CM

## 2022-03-02 DIAGNOSIS — I10 Essential (primary) hypertension: Secondary | ICD-10-CM

## 2022-03-02 DIAGNOSIS — E113313 Type 2 diabetes mellitus with moderate nonproliferative diabetic retinopathy with macular edema, bilateral: Secondary | ICD-10-CM

## 2022-03-02 DIAGNOSIS — H40053 Ocular hypertension, bilateral: Secondary | ICD-10-CM

## 2022-03-02 DIAGNOSIS — Z961 Presence of intraocular lens: Secondary | ICD-10-CM

## 2022-03-02 DIAGNOSIS — H35033 Hypertensive retinopathy, bilateral: Secondary | ICD-10-CM

## 2022-03-02 NOTE — Progress Notes (Signed)
[facility] Note  03/09/2022     CHIEF COMPLAINT Patient presents for Retina Follow Up   HISTORY OF PRESENT ILLNESS: Kimberly Frazier is a 61 y.o. female who presents to the clinic today for:   HPI     Retina Follow Up   Patient presents with  Diabetic Retinopathy.  In both eyes.  This started years ago.  Duration of 4 weeks.  Since onset it is stable.  I, the attending physician,  performed the HPI with the patient and updated documentation appropriately.        Comments   Patient feels that the vision is the same since her last visit 4 weeks ago. Her blood sugar was 157 and her A1C is 9.?.      Last edited by Bernarda Caffey, MD on 03/12/2022 12:27 AM.     Patient states that the vision is the same, she using PF once a day, ketorolac QID, brim and Cosopt BID  Referring physician: Ladell Pier, MD [address] [place],  [place] [address]  HISTORICAL INFORMATION:   Selected notes from the MEDICAL RECORD NUMBER Referred by Dr. Rutherford Guys for concern of CME    CURRENT MEDICATIONS: Current Outpatient Medications (Ophthalmic Drugs)  Medication Sig   brimonidine (ALPHAGAN) 0.2 % ophthalmic solution Place 1 drop into both eyes 2 (two) times daily.   dorzolamide-timolol (COSOPT) 22.3-6.8 MG/ML ophthalmic solution Place 1 drop into both eyes 2 (two) times daily.   ketorolac (ACULAR) 0.5 % ophthalmic solution Place 1 drop into both eyes 4 (four) times daily.   prednisoLONE acetate (PRED FORTE) 1 % ophthalmic suspension Place 1 drop into both eyes 4 (four) times daily.   No current facility-administered medications for this visit. (Ophthalmic Drugs)   Current Outpatient Medications (Other)  Medication Sig   Accu-Chek Softclix Lancets lancets Use to check blood sugar 3 times daily.   amLODipine (NORVASC) 10 MG tablet TAKE 1 TABLET (10 MG TOTAL) BY MOUTH DAILY.   aspirin EC 81 MG tablet Take 1 tablet (81 mg total) daily by mouth.    atorvastatin (LIPITOR) 80 MG tablet Take 1 tablet (80 mg total) by mouth once daily. (PLEASE SCHEDULE AN APPT FOR FUTURE REFILLS)   Blood Glucose Monitoring Suppl (ACCU-CHEK GUIDE) w/Device KIT 1 kit by Does not apply route in the morning, at noon, and at bedtime. Use to check blood sugar 3 times daily.   carvedilol (COREG) 25 MG tablet TAKE 1 TABLET (25 MG TOTAL) BY MOUTH 2 (TWO) TIMES DAILY WITH A MEAL.   ciprofloxacin (CIPRO) 250 MG tablet Take 1 tablet (250 mg total) by mouth 2 (two) times daily.   Continuous Blood Gluc Receiver (FREESTYLE LIBRE 2 READER) DEVI Use as directed   Continuous Blood Gluc Sensor (FREESTYLE LIBRE SENSOR SYSTEM) MISC Change sensor every 2 weeks   diclofenac Sodium (VOLTAREN) 1 % GEL Apply 2 g topically 2 (two) times daily as needed.   Dulaglutide (TRULICITY) 1.5 TL/5.7WI SOPN Inject 1.5 mg into the skin once a week.   ezetimibe (ZETIA) 10 MG tablet Take 1 tablet (10 mg total) by mouth daily.   glucose blood (ACCU-CHEK GUIDE) test strip Use to check blood sugar 3 times daily.   insulin glargine (LANTUS SOLOSTAR) 100 UNIT/ML Solostar Pen INJECT 58 UNITS INTO THE SKIN DAILY.   insulin lispro (HUMALOG KWIKPEN) 100 UNIT/ML KwikPen Inject 28 Units into the skin in the morning and at bedtime.   Insulin Pen Needle (  BD PEN NEEDLE NANO U/F) 32G X 4 MM MISC Use as directed.   Insulin Syringe-Needle U-100 (BD VEO INSULIN SYRINGE U/F) 31G X 15/64" 0.3 ML MISC USE TO INJECT NOVOLOG THREE TIMES DAILY   isosorbide mononitrate (IMDUR) 60 MG 24 hr tablet TAKE 1 TABLET (60 MG TOTAL) BY MOUTH DAILY.   Na Sulfate-K Sulfate-Mg Sulf 17.5-3.13-1.6 GM/177ML SOLN Suprep (no substitutions)-TAKE AS DIRECTED.   nitroGLYCERIN (NITROSTAT) 0.4 MG SL tablet Place 1 tab under tongue for chest pain.  May repeat after 5 minutes x 2.  DO NOT TAKE MORE THAN 3 TABS DURING AN EPISODE OF CHEST PAIN   potassium chloride (KLOR-CON) 10 MEQ tablet Take 2 tablets (20 mEq total) by mouth daily.   No current  facility-administered medications for this visit. (Other)   REVIEW OF SYSTEMS: ROS   Positive for: Endocrine, Cardiovascular, Eyes Negative for: Constitutional, Gastrointestinal, Neurological, Skin, Genitourinary, Musculoskeletal, HENT, Respiratory, Psychiatric, Allergic/Imm, Heme/Lymph Last edited by Annie Paras, COT on 03/09/2022  8:59 AM.      ALLERGIES Allergies  Allergen Reactions   Jardiance [Empagliflozin] Other (See Comments)    Frequent yeast infection   PAST MEDICAL HISTORY Past Medical History:  Diagnosis Date   Adrenal adenoma, left    Arthritis    Cancer ([place])    CKD (chronic kidney disease), stage II    Coronary artery disease 07-28-2018 followed [facility]  currently due to no insurance   per cardiac cath 02-06-2015 (positive mild lateral ishcemia on stress test)--- dLAD 80%,  mLAD 40%,  mPDA 80% (small vessel),  ostial D1 70%,  CFx with lumial irregarlities-- medical management   Depression    Diabetic neuropathy ([place])    Diabetic retinopathy ([place])    NPDR OU   Headache    History of Bell's palsy 07/2011   per pt residual facial pain on left side occasionally   History of cancer of vagina 1999   per pt completed radiation and chemo   History of sepsis 12/30/2017   positive blood culter fro E.coli   Hyperlipidemia    Hypertension    Hypertensive retinopathy    OU   Hypokalemia    Insulin dependent type 2 diabetes mellitus, uncontrolled    followed by pcp---  A1c was 11.7 on 06-15-2018 in epic   Myocardial infarction [facility])    10 yrs ago?   Nocturia    Peripheral neuropathy    PMB (postmenopausal bleeding)    Wears dentures    upper   Wears glasses    Past Surgical History:  Procedure Laterality Date   BREAST BIOPSY  2012   benign   CARDIAC CATHETERIZATION N/A 02/06/2015   Procedure: Left Heart Cath and Coronary Angiography;  Surgeon: Larey Dresser, MD;  Location: [facility];  Service: Cardiovascular;   Laterality: N/A;   CATARACT EXTRACTION Bilateral OD: 03/07/19, OS: 02/28/19   Dr. Gershon Crane   CATARACT EXTRACTION, BILATERAL     COLONOSCOPY     normal exam in [place], [place] 10-12 years ago   EYE SURGERY Bilateral OD: 03/07/19, OS: 02/28/19   Cat Sx - Dr. Gershon Crane   HYSTEROSCOPY WITH D & C N/A 08/01/2018   Procedure: DILATATION AND CURETTAGE /HYSTEROSCOPY;  Surgeon: Mora Bellman, MD;  Location: [facility];  Service: Gynecology;  Laterality: N/A;   ROBOTIC ASSISTED TOTAL HYSTERECTOMY WITH BILATERAL SALPINGO OOPHERECTOMY N/A 11/28/2018   Procedure: XI ROBOTIC ASSISTED TOTAL HYSTERECTOMY WITH BILATERAL SALPINGO OOPHORECTOMY;  Surgeon: Everitt Amber,  MD;  Location: [facility];  Service: Gynecology;  Laterality: N/A;   SENTINEL NODE BIOPSY N/A 11/28/2018   Procedure: SENTINEL NODE BIOPSY;  Surgeon: Everitt Amber, MD;  Location: [facility];  Service: Gynecology;  Laterality: N/A;   UMBILICAL HERNIA REPAIR  child   FAMILY HISTORY Family History  Problem Relation Age of Onset   Heart disease Mother    Hypertension Mother    Diabetes Mother    Cancer Father        hodgkins lymphoma   Heart disease Sister        heart attack   Diabetes Sister    Colon cancer Brother 22   Diabetes Maternal Aunt    Diabetes Maternal Uncle    Cancer Other        parent   Diabetes Other        parent   Heart disease Other        parent   Hyperlipidemia Other        parent   Hypertension Other        parent   Arthritis Other        parent   Breast cancer Neg Hx    Colon polyps Neg Hx    Esophageal cancer Neg Hx    Rectal cancer Neg Hx    Stomach cancer Neg Hx    SOCIAL HISTORY Social History   Tobacco Use   Smoking status: Never   Smokeless tobacco: Never  Vaping Use   Vaping Use: Never used  Substance Use Topics   Alcohol use: Not Currently   Drug use: Not Currently    Types: Marijuana    Comment: every 2 weeks per pt       OPHTHALMIC EXAM:  Base Eye Exam     Visual Acuity  (Snellen - Linear)       Right Left   Dist [place] 20/20 20/300   Dist ph [place]  NI         Tonometry (Tonopen, 9:02 AM)       Right Left   Pressure 20 24         Pupils       Dark Light Shape React APD   Right 3 2 Round Brisk None   Left 3 2 Round Brisk None         Visual Fields       Left Right    Full Full         Extraocular Movement       Right Left    Full, Ortho Full, Ortho         Neuro/Psych     Oriented x3: Yes   Mood/Affect: Normal         Dilation     Both eyes: 2.5% Phenylephrine, 1.0% Mydriacyl @ 8:59 AM           Slit Lamp and Fundus Exam     Slit Lamp Exam       Right Left   Lids/Lashes Dermatochalasis - upper lid Dermatochalasis - upper lid   Conjunctiva/Sclera Mild Melanosis Mild Melanosis   Cornea 1+ Punctate epithelial erosions 2+ central punctate epithelial erosions, decreased TBUT, mild tear film debris, well healed temporal cataract wounds   Anterior Chamber Deep, 0.5+cell/pigment Deep, 0.5+cell/pigment   Iris Round and dilated, No NVI Round and dilated, No NVI   Lens PC IOL in good position with trace PCO PC IOL in good position with 1+PCO   Anterior Vitreous Vitreous  syneresis Vitreous syneresis, Posterior vitreous detachment, vitreous condensations         Fundus Exam       Right Left   Disc Pallor, Sharp rim Compact, Pink and Sharp, temporal PPA   C/D Ratio 0.1 0.1   Macula Good foveal reflex, persistent cystic changes / edema inferior macula -- slightly improved, scattered Microaneurysms Blunted foveal reflex, central edema - persistent / slightly improved, +exudates/MA/IRH -- greatest temporal macula -- improved   Vessels attenuated, Tortuous attenuated, Tortuous   Periphery Attached, scattered drusen, scattered DBH Attached, scattered DBH greatest posteriorly, mild peripheral drusen           IMAGING AND PROCEDURES  Imaging and Procedures for 03/09/2022  OCT, Retina - OU - Both Eyes       Right  Eye Quality was good. Central Foveal Thickness: 267. Progression has improved. Findings include no SRF, abnormal foveal contour, intraretinal hyper-reflective material, intraretinal fluid, vitreomacular adhesion (Persistent IRF inferior macula and fovea -- Slight improvement ).   Left Eye Quality was good. Central Foveal Thickness: 334. Progression has improved. Findings include no SRF, abnormal foveal contour, subretinal hyper-reflective material, intraretinal hyper-reflective material, intraretinal fluid, outer retinal atrophy, vitreomacular adhesion (Mild interval improvement in IRF/IRHM inferior and temporal mac and fovea).   Notes *Images captured and stored on drive  Diagnosis / Impression:  Macular edema OU - likely combination of CME + DME OU OD: Persistent IRF inferior macula and fovea -- Slight improvement  OS: Mild interval improvement in IRF/IRHM inferior and temporal mac and fovea  Clinical management:  See below  Abbreviations: NFP - Normal foveal profile. CME - cystoid macular edema. PED - pigment epithelial detachment. IRF - intraretinal fluid. SRF - subretinal fluid. EZ - ellipsoid zone. ERM - epiretinal membrane. ORA - outer retinal atrophy. ORT - outer retinal tubulation. SRHM - subretinal hyper-reflective material      Intravitreal Injection, Pharmacologic Agent - OD - Right Eye       Time Out 03/09/2022. 9:16 AM. Confirmed correct patient, procedure, site, and patient consented.   Anesthesia Topical anesthesia was used. Anesthetic medications included Lidocaine 2%, Proparacaine 0.5%.   Procedure Preparation included 5% betadine to ocular surface, eyelid speculum. A (32g) needle was used.   Injection: 2 mg aflibercept 2 MG/0.05ML   Route: Intravitreal, Site: Right Eye   NDC: A3590391, Lot: [id number], Expiration date: 07/01/2023, Waste: 0 mL   Post-op Post injection exam found visual acuity of at least counting fingers. The patient tolerated the  procedure well. There were no complications. The patient received written and verbal post procedure care education.      Intravitreal Injection, Pharmacologic Agent - OS - Left Eye       Time Out 03/09/2022. 9:22 AM. Confirmed correct patient, procedure, site, and patient consented.   Anesthesia Topical anesthesia was used. Anesthetic medications included Lidocaine 2%, Proparacaine 0.5%.   Procedure Preparation included 5% betadine to ocular surface, eyelid speculum. A (32g) needle was used.   Injection: 6 mg faricimab-svoa 6 MG/0.05ML   Route: Intravitreal, Site: Left Eye   NDC: S6832610, Lot: [id number], Expiration date: 12/29/2023, Waste: 0 mL   Post-op Post injection exam found visual acuity of at least counting fingers. The patient tolerated the procedure well. There were no complications. The patient received written and verbal post procedure care education.            ASSESSMENT/PLAN:    ICD-10-CM   1. Moderate nonproliferative diabetic retinopathy of both eyes with macular edema associated  with type 2 diabetes mellitus (HCC)  E11.3313 OCT, Retina - OU - Both Eyes    Intravitreal Injection, Pharmacologic Agent - OD - Right Eye    Intravitreal Injection, Pharmacologic Agent - OS - Left Eye    faricimab-svoa (VABYSMO) 74m/0.05mL intravitreal injection    aflibercept (EYLEA) SOLN 2 mg    2. CME (cystoid macular edema), bilateral  H35.353     3. Essential hypertension  I10     4. Hypertensive retinopathy of both eyes  H35.033     5. Pseudophakia of both eyes  Z96.1     6. Bilateral ocular hypertension  H40.053       1,2. Moderate Non-proliferative diabetic retinopathy, OU OD with combination DME/CME  - delayed f/u - 5.5 wks instead of 4  - last A1c 9.6 on 08.07.23, 10.1 on 1.31.23             - h/o delayed to follow up from September 2021 to April 2022--7 mos instead of 4 wks**             - exam shows scattered IRH, edema and tortuous blood vessels OU              - FA (10.23.20) shows leaking MA OU and paracentral petaloid hyperfluorecence OD --> CME/[place]             - OCT shows diabetic macular edema OU; OD with CME/[place] component contributing as well - s/p IVA OS #1 (10.23.20), #2 (11.18.20), #3 (12.18.20), #4 (02.24.21), #5 (03.24.21) - s/p IVA OD #1 (11.06.20), #2 (12.18.20), #3 (02.24.21), #4 (03.24.21), #5 (04.21.21), #6 (5.26.21), #7 (07.26.21), #8 (08.25.21), #9 (09.23.21), #10 (04.26.22) - s/p IVE OS #1 (04.21.21), #2 (5.26.21), #3 (07.26.21), #4 (08.25.21), #5 (09.23.21), #6 (04.26.22), #7 (05.25.22), #8 (07.01.22), #9 (07.29.22), #10 (08.29.22 - JDM), #11 (09.26.22), #12 (10.24.22) - s/p IVE OD #1 (05.25.22), #2 (07.01.22), #3 (07.29.22), #4 (08.29.22 - JDM), #5 (09.26.22), #6 (10.24.22), #7 (11.21.22), #8 (12.19.22), #9 (01.23.23), #10 (02.20.23), #11 (03.22.23), #12 (04.19.23), #13 (05.17.23), #14 (06.14.23), #15 (08.04.23), #16 (09.01.23) - s/p IV Vabysmo OS #1 (11.21.22, sample), #2 (12.19.22), #3 (01.23.23), #4 (02.20.23), #5 (03.22.23), #6 (04.19.23), #7 (05.17.23), #8 (06.14.23, sample), #9 (08.04.23), #10 (9.01.23) - BCVA OD 20/20; OS 20/300 -- decreased - OCT today: OD: Persistent IRF inferior macula and fovea -- slight improvement; OS: Mild interval improvement in IRF/IRHM inferior and temporal mac and fovea at 5.5 wks             - recommend IVE OD #17 and IV Vabysmo OS #11 today, 10.10.23 with follow up 5 weeks  - pt wishes to proceed w/ injections             - RBA of procedure discussed, questions answered             - informed consent obtained             - [place]ast[place] informed consent form signed and scanned on 12.18.2020       - Eylea OU informed consent form re-signed and scanned on 05.17.23             - Vabysmo OS consent form signed and scanned on 11.21.22             - see procedure note             - Eylea4U benefits investigation started, 04.21.21             - cont Pred  Forte qd OU, continue Ketorolac  QID OU             - f/u in 5 weeks, DFE, OCT, possible injection OU   3,4. Hypertensive retinopathy OU             - discussed importance of tight BP control             - continue to monitor   5. Pseudophakia OU            - s/p CE/IOL OU Gershon Crane)                        OD: 03/07/2019                        OS: 02/28/2019             - IOLs in good position             - CME OU as above             - continue to monitor   6. Ocular Hypertension OU             - IOP: 20,24 - h/o steroid response             - cont Cosopt BID OU and Brimonidine BID OU  Ophthalmic Meds Ordered this visit:  Meds ordered this encounter  Medications   faricimab-svoa (VABYSMO) 9m/0.05mL intravitreal injection   aflibercept (EYLEA) SOLN 2 mg     Return in about 5 weeks (around 04/13/2022) for f/u NPRD OU, DFE, OCT.  There are no Patient Instructions on file for this visit.  Explained the diagnoses, plan, and follow up with the patient and they expressed understanding.  Patient expressed understanding of the importance of proper follow up care.    This document serves as a record of services personally performed by BGardiner Sleeper MD, PhD. It was created on their behalf by CRenaldo Reel CStaytonan ophthalmic technician. The creation of this record is the provider's dictation and/or activities during the visit.    Electronically signed by:  CRenaldo Reel COT  10.03.23 12:28 AM.  This document serves as a record of services personally performed by BGardiner Sleeper MD, PhD. It was created on their behalf by ASan Jetty BOwens Shark OA an ophthalmic technician. The creation of this record is the provider's dictation and/or activities during the visit.    Electronically signed by: ASan Jetty BClinton ONew York10.10.2023 12:28 AM  BGardiner Sleeper M.D., Ph.D. Diseases & Surgery of the Retina and Vitreous [facility] I have reviewed the above documentation for accuracy and  completeness, and I agree with the above. BGardiner Sleeper M.D., Ph.D. 03/12/22 12:30 AM  Abbreviations: M myopia (nearsighted); A astigmatism; H hyperopia (farsighted); P presbyopia; Mrx spectacle prescription;  CTL contact lenses; OD right eye; OS left eye; OU both eyes  XT exotropia; ET esotropia; PEK punctate epithelial keratitis; PEE punctate epithelial erosions; DES dry eye syndrome; MGD meibomian gland dysfunction; ATs artificial tears; PFAT's preservative free artificial tears; NKemptonnuclear sclerotic cataract; PSC posterior subcapsular cataract; ERM epi-retinal membrane; PVD posterior vitreous detachment; RD retinal detachment; DM diabetes mellitus; DR diabetic retinopathy; NPDR non-proliferative diabetic retinopathy; PDR proliferative diabetic retinopathy; CSME clinically significant macular edema; DME diabetic macular edema; dbh dot blot hemorrhages; CWS cotton wool spot; POAG primary open angle glaucoma; C/D cup-to-disc ratio; HVF humphrey  visual field; GVF goldmann visual field; OCT optical coherence tomography; IOP intraocular pressure; BRVO Branch retinal vein occlusion; CRVO central retinal vein occlusion; CRAO central retinal artery occlusion; BRAO branch retinal artery occlusion; RT retinal tear; SB scleral buckle; PPV pars plana vitrectomy; VH Vitreous hemorrhage; PRP panretinal laser photocoagulation; IVK intravitreal kenalog; VMT vitreomacular traction; MH Macular hole;  NVD neovascularization of the disc; NVE neovascularization elsewhere; AREDS age related eye disease study; ARMD age related macular degeneration; POAG primary open angle glaucoma; EBMD epithelial/anterior basement membrane dystrophy; ACIOL anterior chamber intraocular lens; IOL intraocular lens; PCIOL posterior chamber intraocular lens; Phaco/IOL phacoemulsification with intraocular lens placement; [place] photorefractive keratectomy; LASIK laser assisted in situ keratomileusis; HTN hypertension; DM diabetes mellitus; COPD chronic  obstructive pulmonary disease

## 2022-03-03 ENCOUNTER — Other Ambulatory Visit: Payer: Self-pay

## 2022-03-09 ENCOUNTER — Encounter (INDEPENDENT_AMBULATORY_CARE_PROVIDER_SITE_OTHER): Payer: Self-pay | Admitting: Ophthalmology

## 2022-03-09 ENCOUNTER — Ambulatory Visit (INDEPENDENT_AMBULATORY_CARE_PROVIDER_SITE_OTHER): Payer: Medicare Other | Admitting: Ophthalmology

## 2022-03-09 DIAGNOSIS — I10 Essential (primary) hypertension: Secondary | ICD-10-CM | POA: Diagnosis not present

## 2022-03-09 DIAGNOSIS — H35033 Hypertensive retinopathy, bilateral: Secondary | ICD-10-CM | POA: Diagnosis not present

## 2022-03-09 DIAGNOSIS — E113313 Type 2 diabetes mellitus with moderate nonproliferative diabetic retinopathy with macular edema, bilateral: Secondary | ICD-10-CM

## 2022-03-09 DIAGNOSIS — Z961 Presence of intraocular lens: Secondary | ICD-10-CM | POA: Diagnosis not present

## 2022-03-09 DIAGNOSIS — H35353 Cystoid macular degeneration, bilateral: Secondary | ICD-10-CM

## 2022-03-09 DIAGNOSIS — H40053 Ocular hypertension, bilateral: Secondary | ICD-10-CM

## 2022-03-09 MED ORDER — FARICIMAB-SVOA 6 MG/0.05ML IZ SOLN
6.0000 mg | INTRAVITREAL | Status: AC | PRN
  Administered 2022-03-09: 6 mg via INTRAVITREAL

## 2022-03-09 MED ORDER — AFLIBERCEPT 2MG/0.05ML IZ SOLN FOR KALEIDOSCOPE
2.0000 mg | INTRAVITREAL | Status: AC | PRN
  Administered 2022-03-09: 2 mg via INTRAVITREAL

## 2022-03-10 ENCOUNTER — Inpatient Hospital Stay: Payer: Medicare Other | Admitting: Obstetrics & Gynecology

## 2022-03-10 ENCOUNTER — Other Ambulatory Visit: Payer: Self-pay

## 2022-03-10 ENCOUNTER — Telehealth: Payer: Self-pay | Admitting: *Deleted

## 2022-03-10 NOTE — Telephone Encounter (Signed)
Returned message and canceled patient's appt due to transportation. Patient stated that she will call the office back when she is ready to reschedule

## 2022-03-10 NOTE — Progress Notes (Deleted)
S:    PCP: Kimberly Frazier is a 61 y.o. female who presents for diabetes evaluation, education, and management.  PMH is significant for DM, HTN, CAD, HLD, obesity, and endometrial cancer.   Patient was referred and last seen by Primary Care Provider, Dr.Johnson, on 02/08/22. At last visit, she reported utilizing her Novolog twice daily, either d/t forgetfulness or only eat 2 meals a day. She reported drinking a 2 liter bottle of soda in 2 days and typical snacks include chips, candy, grapes, and watermelon. Her reported BG range was 103-246. Humalog dose was increased and to be given with the 2 largest meals of the day and her Trulicity was increased to 1.'5mg'$  weekly.  She was previously on Jardiance therapy but this was discontinued d/t frequent yeast infections.   Today, patient arrives in *** good spirits and presents without *** any assistance. ***  Patient reports Diabetes was diagnosed in ***.   Family/Social History:  -Fhx: DM, HTN, CAD, colon cancer, MI -Tobacco: *** -Alcohol: ***  Current diabetes medications include: Trulicity 1.'5mg'$  once weekly, Lantus 58 units daily, and Humalog 28 units BID Current hypertension medications include:amlodipine '10mg'$  once daily, carvedilol '25mg'$  BID Current hyperlipidemia medications include: atorvastatin '80mg'$  once daily, ezetimibe '10mg'$  once daily   Patient reports adherence to taking all medications as prescribed.  *** Patient denies adherence with medications, reports missing *** medications *** times per week, on average.  Do you feel that your medications are working for you? {YES NO:22349} Have you been experiencing any side effects to the medications prescribed? {YES NO:22349} Do you have any problems obtaining medications due to transportation or finances? {YES [id number] Insurance coverage: ***  Patient {Actions; denies-reports:120008} hypoglycemic events.  Reported home fasting blood sugars: ***  Reported 2 hour  post-meal/random blood sugars: ***.  Patient {Actions; denies-reports:120008} nocturia (nighttime urination).  Patient {Actions; denies-reports:120008} neuropathy (nerve pain). Patient {Actions; denies-reports:120008} visual changes. Patient {Actions; denies-reports:120008} self foot exams.   Patient reported dietary habits: Eats *** meals/day Breakfast: *** Lunch: *** Dinner: *** Snacks: *** Drinks: ***  Within the past 12 months, did you worry whether your food would run out before you got money to buy more? {YES NO:22349} Within the past 12 months, did the food you bought run out, and you didn't have money to get more? {YES NO:22349} PHQ-9 Score: ***  Patient-reported exercise habits: ***   O:   ROS  Physical Exam  7 day average blood glucose: ***  *** CGM Download:  % Time CGM is active: ***% Average Glucose: *** mg/dL Glucose Management Indicator: ***  Glucose Variability: *** (goal <36%) Time in Goal:  - Time in range 70-180: ***% - Time above range: ***% - Time below range: ***% Observed patterns:   Lab Results  Component Value Date   HGBA1C 9.6 (H) 01/04/2022   There were no vitals filed for this visit.  Lipid Panel     Component Value Date/Time   CHOL 131 09/01/2021 0949   TRIG 103 09/01/2021 0949   HDL 41 09/01/2021 0949   CHOLHDL 3.2 09/01/2021 0949   CHOLHDL 5 07/15/2015 1513   VLDL 44.0 (H) 07/15/2015 1513   LDLCALC 71 09/01/2021 0949   LDLDIRECT 93.0 07/15/2015 1513    Clinical Atherosclerotic Cardiovascular Disease (ASCVD): {YES/NO:21197} The 10-year ASCVD risk score (Arnett DK, et al., 2019) is: 12.9%   Values used to calculate the score:     Age: 3 years     Sex: Female  Is Non-Hispanic African American: Yes     Diabetic: Yes     Tobacco smoker: No     Systolic Blood Pressure: 921 mmHg     Is BP treated: Yes     HDL Cholesterol: 41 mg/dL     Total Cholesterol: 131 mg/dL   Patient is participating in a Managed Medicaid Plan:   {MM YES/NO:27447::"Yes"}   A/P: Diabetes longstanding *** currently ***. Patient is *** able to verbalize appropriate hypoglycemia management plan. Medication adherence appears ***. Control is suboptimal due to ***. -{Meds adjust:18428} basal insulin *** (insulin ***). Patient will continue to titrate 1 unit every *** days if fasting blood sugar > '100mg'$ /dl until fasting blood sugars reach goal or next visit.  -{Meds adjust:18428} rapid insulin *** (insulin ***) to ***.  -{Meds adjust:18428} GLP-1 *** (generic ***) to ***.  -{Meds adjust:18428} SGLT2-I *** (generic ***) to ***. Counseled on sick day rules. -{Meds adjust:18428} metformin *** to ***.  -Patient educated on purpose, proper use, and potential adverse effects of ***.  -Extensively discussed pathophysiology of diabetes, recommended lifestyle interventions, dietary effects on blood sugar control.  -Counseled on s/sx of and management of hypoglycemia.  -Next A1c anticipated December.   ASCVD risk - primary ***secondary prevention in patient with diabetes. Last LDL is 71 not at goal of <70 mg/dL. ASCVD risk factors include *** and 10-year ASCVD risk score of ***. {Desc; low/moderate/high:110033} intensity statin indicated.  -{Meds adjust:18428} ***statin *** mg.   Hypertension longstanding *** currently ***. Blood pressure goal of <130/80 *** mmHg. Medication adherence ***. Blood pressure control is suboptimal due to ***. -***  Written patient instructions provided. Patient verbalized understanding of treatment plan.  Total time in face to face counseling *** minutes.    Follow-up:  Pharmacist ***. PCP clinic visit in January  Maryan Puls, PharmD PGY-1 [facility] Resident

## 2022-03-11 ENCOUNTER — Ambulatory Visit: Payer: Medicare Other | Admitting: Pharmacist

## 2022-03-11 ENCOUNTER — Ambulatory Visit: Payer: Self-pay

## 2022-03-11 ENCOUNTER — Other Ambulatory Visit: Payer: Self-pay

## 2022-03-11 MED ORDER — CIPROFLOXACIN HCL 250 MG PO TABS
250.0000 mg | ORAL_TABLET | Freq: Two times a day (BID) | ORAL | 0 refills | Status: DC
  Filled 2022-03-11: qty 14, 7d supply, fill #0

## 2022-03-11 NOTE — Addendum Note (Signed)
Addended by: Karle Plumber B on: 03/11/2022 05:14 PM   Modules accepted: Orders

## 2022-03-11 NOTE — Telephone Encounter (Signed)
Routing to PCP for review.

## 2022-03-11 NOTE — Telephone Encounter (Signed)
Message from Sharene Skeans sent at 03/11/2022  9:21 AM EDT  Summary: UTI   Pt has a UTI and asked for antibiotics to be called into the pharmacy / please advise          Chief Complaint: urinary burning, urgency  Symptoms: lower back pain, hot flashes Frequency: sx started last weekend Pertinent Negatives: Patient denies  blood in urine, flank pain, genital sores, urgency, vaginal discharge Disposition: '[]'$ ED /'[]'$ Urgent Care (no appt availability in office) / '[]'$ Appointment(In office/virtual)/ '[]'$  [facility]/ '[]'$ Home Care/ '[]'$ Refused Recommended Disposition /'[]'$ [facility]/ '[x]'$  Follow-up with PCP Additional Notes: pt stated that she does not have any transportation today.  Pt asking for Abx to be called in. No appts available.   Reason for Disposition  Side (flank) or lower back pain present  Answer Assessment - Initial Assessment Questions 1. SEVERITY: "How bad is the pain?"  (e.g., Scale 1-10; mild, moderate, or severe)   - MILD (1-3): complains slightly about urination hurting   - MODERATE (4-7): interferes with normal activities     - SEVERE (8-10): excruciating, unwilling or unable to urinate because of the pain     7-8 burning 2. FREQUENCY: "How many times have you had painful urination today?"      2 3. PATTERN: "Is pain present every time you urinate or just sometimes?"      Yes  4. ONSET: "When did the painful urination start?"      Last weekend 5. FEVER: "Do you have a fever?" If Yes, ask: "What is your temperature, how was it measured, and when did it start?"     no 6. PAST UTI: "Have you had a urine infection before?" If Yes, ask: "When was the last time?" and "What happened that time?"      Yes - submitted a sample - abx 7. CAUSE: "What do you think is causing the painful urination?"  (e.g., UTI, scratch, Herpes sore)     UTI 8. OTHER SYMPTOMS: "Do you have any other symptoms?" (e.g., blood in urine, flank pain, genital sores, urgency, vaginal  discharge)     Across the lower back  9. PREGNANCY: "Is there any chance you are pregnant?" "When was your last menstrual period?"     N/a  Protocols used: Urination Pain - Female-A-AH

## 2022-03-12 ENCOUNTER — Other Ambulatory Visit: Payer: Self-pay

## 2022-03-12 NOTE — Telephone Encounter (Signed)
VM has been left informing patient of refill.

## 2022-03-16 ENCOUNTER — Other Ambulatory Visit: Payer: Self-pay

## 2022-03-17 ENCOUNTER — Other Ambulatory Visit: Payer: Self-pay

## 2022-03-30 NOTE — Progress Notes (Signed)
[facility] Note  04/13/2022     CHIEF COMPLAINT Patient presents for Retina Follow Up   HISTORY OF PRESENT ILLNESS: Kimberly Frazier is a 61 y.o. female who presents to the clinic today for:   HPI     Retina Follow Up   Patient presents with  Diabetic Retinopathy.  In both eyes.  Severity is moderate.  Duration of 5 weeks.  Since onset it is stable.  I, the attending physician,  performed the HPI with the patient and updated documentation appropriately.        Comments   Pt here for 5 wk ret f/u for NPDR OU. Pt states VA the same, no change. Last A1c in [place] 9.6, down from 11.       Last edited by Bernarda Caffey, MD on 04/14/2022 12:01 AM.     Referring physician: Ladell Pier, MD [address] [place],  [place] [address]  HISTORICAL INFORMATION:   Selected notes from the MEDICAL RECORD NUMBER Referred by Dr. Rutherford Guys for concern of CME    CURRENT MEDICATIONS: Current Outpatient Medications (Ophthalmic Drugs)  Medication Sig   brimonidine (ALPHAGAN) 0.2 % ophthalmic solution Place 1 drop into both eyes 2 (two) times daily.   dorzolamide-timolol (COSOPT) 2-0.5 % ophthalmic solution Place 1 drop into both eyes 2 (two) times daily.   ketorolac (ACULAR) 0.5 % ophthalmic solution Place 1 drop into both eyes 4 (four) times daily.   prednisoLONE acetate (PRED FORTE) 1 % ophthalmic suspension Place 1 drop into both eyes daily.   No current facility-administered medications for this visit. (Ophthalmic Drugs)   Current Outpatient Medications (Other)  Medication Sig   Accu-Chek Softclix Lancets lancets Use to check blood sugar 3 times daily.   amLODipine (NORVASC) 10 MG tablet TAKE 1 TABLET (10 MG TOTAL) BY MOUTH DAILY.   aspirin EC 81 MG tablet Take 1 tablet (81 mg total) daily by mouth.   atorvastatin (LIPITOR) 80 MG tablet Take 1 tablet (80 mg total) by mouth once daily. (PLEASE SCHEDULE AN APPT FOR FUTURE REFILLS)   Blood  Glucose Monitoring Suppl (ACCU-CHEK GUIDE) w/Device KIT 1 kit by Does not apply route in the morning, at noon, and at bedtime. Use to check blood sugar 3 times daily.   carvedilol (COREG) 25 MG tablet TAKE 1 TABLET (25 MG TOTAL) BY MOUTH 2 (TWO) TIMES DAILY WITH A MEAL.   ciprofloxacin (CIPRO) 250 MG tablet Take 1 tablet (250 mg total) by mouth 2 (two) times daily.   Continuous Blood Gluc Receiver (FREESTYLE LIBRE 2 READER) DEVI Use as directed   Continuous Blood Gluc Sensor (FREESTYLE LIBRE SENSOR SYSTEM) MISC Change sensor every 2 weeks   diclofenac Sodium (VOLTAREN) 1 % GEL Apply 2 g topically 2 (two) times daily as needed.   Dulaglutide (TRULICITY) 1.5 TD/3.2KG SOPN Inject 1.5 mg into the skin once a week.   ezetimibe (ZETIA) 10 MG tablet Take 1 tablet (10 mg total) by mouth daily.   glucose blood (ACCU-CHEK GUIDE) test strip Use to check blood sugar 3 times daily.   insulin glargine (LANTUS SOLOSTAR) 100 UNIT/ML Solostar Pen INJECT 58 UNITS INTO THE SKIN DAILY.   insulin lispro (HUMALOG KWIKPEN) 100 UNIT/ML KwikPen Inject 28 Units into the skin in the morning and at bedtime.   Insulin Pen Needle (BD PEN NEEDLE NANO U/F) 32G X 4 MM MISC Use as directed.   Insulin Syringe-Needle U-100 (BD VEO INSULIN SYRINGE U/F) 31G  X 15/64" 0.3 ML MISC USE TO INJECT NOVOLOG THREE TIMES DAILY   isosorbide mononitrate (IMDUR) 60 MG 24 hr tablet TAKE 1 TABLET (60 MG TOTAL) BY MOUTH DAILY.   Na Sulfate-K Sulfate-Mg Sulf 17.5-3.13-1.6 GM/177ML SOLN Suprep (no substitutions)-TAKE AS DIRECTED.   nitroGLYCERIN (NITROSTAT) 0.4 MG SL tablet Place 1 tab under tongue for chest pain.  May repeat after 5 minutes x 2.  DO NOT TAKE MORE THAN 3 TABS DURING AN EPISODE OF CHEST PAIN   potassium chloride (KLOR-CON) 10 MEQ tablet Take 2 tablets (20 mEq total) by mouth daily.   No current facility-administered medications for this visit. (Other)   REVIEW OF SYSTEMS: ROS   Positive for: Endocrine, Cardiovascular, Eyes Negative  for: Constitutional, Gastrointestinal, Neurological, Skin, Genitourinary, Musculoskeletal, HENT, Respiratory, Psychiatric, Allergic/Imm, Heme/Lymph Last edited by Kingsley Spittle, COT on 04/13/2022  8:55 AM.     ALLERGIES Allergies  Allergen Reactions   Jardiance [Empagliflozin] Other (See Comments)    Frequent yeast infection   PAST MEDICAL HISTORY Past Medical History:  Diagnosis Date   Adrenal adenoma, left    Arthritis    Cancer ([place])    CKD (chronic kidney disease), stage II    Coronary artery disease 07-28-2018 followed [facility])  currently due to no insurance   per cardiac cath 02-06-2015 (positive mild lateral ishcemia on stress test)--- dLAD 80%,  mLAD 40%,  mPDA 80% (small vessel),  ostial D1 70%,  CFx with lumial irregarlities-- medical management   Depression    Diabetic neuropathy ([place])    Diabetic retinopathy ([place])    NPDR OU   Headache    History of Bell's palsy 07/2011   per pt residual facial pain on left side occasionally   History of cancer of vagina 1999   per pt completed radiation and chemo   History of sepsis 12/30/2017   positive blood culter fro E.coli   Hyperlipidemia    Hypertension    Hypertensive retinopathy    OU   Hypokalemia    Insulin dependent type 2 diabetes mellitus, uncontrolled    followed by pcp---  A1c was 11.7 on 06-15-2018 in epic   Myocardial infarction [facility])    10 yrs ago?   Nocturia    Peripheral neuropathy    PMB (postmenopausal bleeding)    Wears dentures    upper   Wears glasses    Past Surgical History:  Procedure Laterality Date   BREAST BIOPSY  2012   benign   CARDIAC CATHETERIZATION N/A 02/06/2015   Procedure: Left Heart Cath and Coronary Angiography;  Surgeon: Larey Dresser, MD;  Location: [facility];  Service: Cardiovascular;  Laterality: N/A;   CATARACT EXTRACTION Bilateral OD: 03/07/19, OS: 02/28/19   Dr. Gershon Crane   CATARACT EXTRACTION, BILATERAL     COLONOSCOPY     normal  exam in [place], [place] 10-12 years ago   EYE SURGERY Bilateral OD: 03/07/19, OS: 02/28/19   Cat Sx - Dr. Gershon Crane   HYSTEROSCOPY WITH D & C N/A 08/01/2018   Procedure: DILATATION AND CURETTAGE /HYSTEROSCOPY;  Surgeon: Mora Bellman, MD;  Location: [facility];  Service: Gynecology;  Laterality: N/A;   ROBOTIC ASSISTED TOTAL HYSTERECTOMY WITH BILATERAL SALPINGO OOPHERECTOMY N/A 11/28/2018   Procedure: XI ROBOTIC ASSISTED TOTAL HYSTERECTOMY WITH BILATERAL SALPINGO OOPHORECTOMY;  Surgeon: Everitt [place], MD;  Location: [facility];  Service: Gynecology;  Laterality: N/A;   SENTINEL NODE BIOPSY N/A 11/28/2018   Procedure: SENTINEL NODE BIOPSY;  Surgeon: Everitt [place], MD;  Location: [facility];  Service: Gynecology;  Laterality: N/A;   UMBILICAL HERNIA REPAIR  child   FAMILY HISTORY Family History  Problem Relation Age of Onset   Heart disease Mother    Hypertension Mother    Diabetes Mother    Cancer Father        hodgkins lymphoma   Heart disease Sister        heart attack   Diabetes Sister    Colon cancer Brother 30   Diabetes Maternal Aunt    Diabetes Maternal Uncle    Cancer Other        parent   Diabetes Other        parent   Heart disease Other        parent   Hyperlipidemia Other        parent   Hypertension Other        parent   Arthritis Other        parent   Breast cancer Neg Hx    Colon polyps Neg Hx    Esophageal cancer Neg Hx    Rectal cancer Neg Hx    Stomach cancer Neg Hx    SOCIAL HISTORY Social History   Tobacco Use   Smoking status: Never   Smokeless tobacco: Never  Vaping Use   Vaping Use: Never used  Substance Use Topics   Alcohol use: Not Currently   Drug use: Not Currently    Types: Marijuana    Comment: every 2 weeks per pt       OPHTHALMIC EXAM:  Base Eye Exam     Visual Acuity (Snellen - Linear)       Right Left   Dist [place] 20/20 20/250-1   Dist ph [place]  NI         Tonometry (Tonopen, 9:00 AM)       Right Left    Pressure 14 16         Pupils       Dark Light Shape React APD   Right 3 2 Round Brisk None   Left 3 2 Round Brisk None         Visual Fields (Counting fingers)       Left Right    Full Full         Extraocular Movement       Right Left    Full, Ortho Full, Ortho         Neuro/Psych     Oriented x3: Yes   Mood/Affect: Normal         Dilation     Both eyes: 1.0% Mydriacyl, 2.5% Phenylephrine @ 9:00 AM           Slit Lamp and Fundus Exam     Slit Lamp Exam       Right Left   Lids/Lashes Dermatochalasis - upper lid Dermatochalasis - upper lid   Conjunctiva/Sclera Mild Melanosis Mild Melanosis   Cornea 1+ Punctate epithelial erosions 2+ central punctate epithelial erosions, decreased TBUT, mild tear film debris, well healed temporal cataract wounds   Anterior Chamber Deep, 0.5+cell/pigment Deep, 0.5+cell/pigment   Iris Round and dilated, No NVI Round and dilated, No NVI   Lens PC IOL in good position with trace PCO PC IOL in good position with 1+PCO   Anterior Vitreous Vitreous syneresis Vitreous syneresis, Posterior vitreous detachment, vitreous condensations         Fundus Exam  Right Left   Disc Pallor, Sharp rim Compact, Pink and Sharp, temporal PPA   C/D Ratio 0.1 0.1   Macula Good foveal reflex, persistent cystic changes / edema inferior macula -- slightly improved, scattered Microaneurysms Blunted foveal reflex, central edema - persistent / slightly improved, +exudates/MA/IRH -- greatest temporal macula -- improved   Vessels attenuated, Tortuous attenuated, Tortuous   Periphery Attached, scattered drusen, scattered DBH Attached, scattered DBH greatest posteriorly, mild peripheral drusen           IMAGING AND PROCEDURES  Imaging and Procedures for 04/13/2022  OCT, Retina - OU - Both Eyes       Right Eye Quality was good. Central Foveal Thickness: 262. Progression has improved. Findings include no SRF, abnormal foveal contour,  intraretinal hyper-reflective material, intraretinal fluid, vitreomacular adhesion (Mild interval improvement in IRF inferior macula and fovea ).   Left Eye Quality was good. Central Foveal Thickness: 312. Progression has improved. Findings include no SRF, abnormal foveal contour, subretinal hyper-reflective material, intraretinal hyper-reflective material, intraretinal fluid, outer retinal atrophy, vitreomacular adhesion (interval improvement in IRF/IRHM inferior and temporal mac and fovea).   Notes *Images captured and stored on drive  Diagnosis / Impression:  Macular edema OU - likely combination of CME + DME OU OD: Mild interval improvement in IRF inferior macula and fovea  OS: interval improvement in IRF/IRHM inferior and temporal mac and fovea  Clinical management:  See below  Abbreviations: NFP - Normal foveal profile. CME - cystoid macular edema. PED - pigment epithelial detachment. IRF - intraretinal fluid. SRF - subretinal fluid. EZ - ellipsoid zone. ERM - epiretinal membrane. ORA - outer retinal atrophy. ORT - outer retinal tubulation. SRHM - subretinal hyper-reflective material      Intravitreal Injection, Pharmacologic Agent - OD - Right Eye       Time Out 04/13/2022. 9:40 AM. Confirmed correct patient, procedure, site, and patient consented.   Anesthesia Topical anesthesia was used. Anesthetic medications included Lidocaine 2%, Proparacaine 0.5%.   Procedure Preparation included 5% betadine to ocular surface, eyelid speculum. A (32g) needle was used.   Injection: 2 mg aflibercept 2 MG/0.05ML   Route: Intravitreal, Site: Right Eye   NDC: A3590391, Lot: [id number], Expiration date: 07/01/2023, Waste: 0 mL   Post-op Post injection exam found visual acuity of at least counting fingers. The patient tolerated the procedure well. There were no complications. The patient received written and verbal post procedure care education.      Intravitreal Injection,  Pharmacologic Agent - OS - Left Eye       Time Out 04/13/2022. 9:40 AM. Confirmed correct patient, procedure, site, and patient consented.   Anesthesia Topical anesthesia was used. Anesthetic medications included Lidocaine 2%, Proparacaine 0.5%.   Procedure Preparation included 5% betadine to ocular surface, eyelid speculum. A (32g) needle was used.   Injection: 6 mg faricimab-svoa 6 MG/0.05ML   Route: Intravitreal, Site: Left Eye   NDC: 92010-071-21, Lot: [id number], Expiration date: 04/29/2024, Waste: 0 mL   Post-op Post injection exam found visual acuity of at least counting fingers. The patient tolerated the procedure well. There were no complications. The patient received written and verbal post procedure care education.            ASSESSMENT/PLAN:    ICD-10-CM   1. Moderate nonproliferative diabetic retinopathy of both eyes with macular edema associated with type 2 diabetes mellitus (HCC)  E11.3313 OCT, Retina - OU - Both Eyes    Intravitreal Injection, Pharmacologic Agent - OD - Right  Eye    Intravitreal Injection, Pharmacologic Agent - OS - Left Eye    faricimab-svoa (VABYSMO) 27m/0.05mL intravitreal injection    aflibercept (EYLEA) SOLN 2 mg    2. CME (cystoid macular edema), bilateral  H35.353     3. Essential hypertension  I10     4. Hypertensive retinopathy of both eyes  H35.033     5. Pseudophakia of both eyes  Z96.1     6. Bilateral ocular hypertension  H40.053      1,2. Moderate Non-proliferative diabetic retinopathy, OU OD with combination DME/CME  - last A1c 9.6 on 08.07.23, 10.1 on 1.31.23             - h/o delayed to follow up from September 2021 to April 2022--7 mos instead of 4 wks**             - exam shows scattered IRH, edema and tortuous blood vessels OU             - FA (10.23.20) shows leaking MA OU and paracentral petaloid hyperfluorecence OD --> CME/[place]             - OCT shows diabetic macular edema OU; OD with CME/[place]  component contributing as well - s/p IVA OS #1 (10.23.20), #2 (11.18.20), #3 (12.18.20), #4 (02.24.21), #5 (03.24.21) - s/p IVA OD #1 (11.06.20), #2 (12.18.20), #3 (02.24.21), #4 (03.24.21), #5 (04.21.21), #6 (5.26.21), #7 (07.26.21), #8 (08.25.21), #9 (09.23.21), #10 (04.26.22) - s/p IVE OS #1 (04.21.21), #2 (5.26.21), #3 (07.26.21), #4 (08.25.21), #5 (09.23.21), #6 (04.26.22), #7 (05.25.22), #8 (07.01.22), #9 (07.29.22), #10 (08.29.22 - JDM), #11 (09.26.22), #12 (10.24.22) -- IVE resistance - s/p IVE OD #1 (05.25.22), #2 (07.01.22), #3 (07.29.22), #4 (08.29.22 - JDM), #5 (09.26.22), #6 (10.24.22), #7 (11.21.22), #8 (12.19.22), #9 (01.23.23), #10 (02.20.23), #11 (03.22.23), #12 (04.19.23), #13 (05.17.23), #14 (06.14.23), #15 (08.04.23), #16 (09.01.23), #17 (10.10.23) - s/p IV Vabysmo OS #1 (11.21.22, sample), #2 (12.19.22), #3 (01.23.23), #4 (02.20.23), #5 (03.22.23), #6 (04.19.23), #7 (05.17.23), #8 (06.14.23, sample), #9 (08.04.23), #10 (9.01.23), #11 (10.10.23) - BCVA OD 20/20; OS 20/250 -- improved - OCT today: OD: Mild interval improvement in IRF inferior macula and fovea; OS: interval improvement in IRF/IRHM inferior and temporal mac and fovea at 5 weeks             - recommend IVE OD #18 and IV Vabysmo OS #12 today, 11.14.23 with follow up 5 weeks  - pt wishes to proceed w/ injections             - RBA of procedure discussed, questions answered             - informed consent obtained             - [place] informed consent form signed and scanned on 12.18.2020       - Eylea OU informed consent form re-signed and scanned on 05.17.23             - Vabysmo OS consent form signed and scanned on 11.21.22             - see procedure note             - Eylea4U benefits investigation started, 04.21.21             - cont Pred Forte qd OU, continue Ketorolac QID OU             - f/u in 5 weeks, DFE, OCT, possible injection OU  3,4. Hypertensive retinopathy OU             - discussed importance of  tight BP control             - continue to monitor   5. Pseudophakia OU            - s/p CE/IOL OU Gershon Crane)                        OD: 03/07/2019                        OS: 02/28/2019             - IOLs in good position             - CME OU as above             - continue to monitor   6. Ocular Hypertension OU             - IOP: 14,16 - h/o steroid response             - cont Cosopt BID OU and Brimonidine BID OU  Ophthalmic Meds Ordered this visit:  Meds ordered this encounter  Medications   prednisoLONE acetate (PRED FORTE) 1 % ophthalmic suspension    Sig: Place 1 drop into both eyes daily.    Dispense:  10 mL    Refill:  1   faricimab-svoa (VABYSMO) 36m/0.05mL intravitreal injection   aflibercept (EYLEA) SOLN 2 mg     Return in about 5 weeks (around 05/18/2022) for f/u NPDR OU, DFE, OCT.  There are no Patient Instructions on file for this visit.  Explained the diagnoses, plan, and follow up with the patient and they expressed understanding.  Patient expressed understanding of the importance of proper follow up care.    This document serves as a record of services personally performed by BGardiner Sleeper MD, PhD. It was created on their behalf by CRenaldo Reel CW[place] ophthalmic technician. The creation of this record is the provider's dictation and/or activities during the visit.    Electronically signed by:  CRenaldo Reel COT  10.31.23 12:46 PM.  This document serves as a record of services personally performed by BGardiner Sleeper MD, PhD. It was created on their behalf by ASan Jetty BOwens Shark OA an ophthalmic technician. The creation of this record is the provider's dictation and/or activities during the visit.    Electronically signed by: ASan Jetty BOwens Shark O[place]11.14.2023 12:46 PM  BGardiner Sleeper M.D., Ph.D. Diseases & Surgery of the Retina and Vitreous [facility] I have reviewed the above documentation for accuracy and completeness,  and I agree with the above. BGardiner Sleeper M.D., Ph.D. 04/16/22 12:48 PM   Abbreviations: M myopia (nearsighted); A astigmatism; H hyperopia (farsighted); P presbyopia; Mrx spectacle prescription;  CTL contact lenses; OD right eye; OS left eye; OU both eyes  XT exotropia; ET esotropia; PEK punctate epithelial keratitis; PEE punctate epithelial erosions; DES dry eye syndrome; MGD meibomian gland dysfunction; ATs artificial tears; PFAT's preservative free artificial tears; [place] sclerotic cataract; PSC posterior subcapsular cataract; ERM epi-retinal membrane; PVD posterior vitreous detachment; RD retinal detachment; DM diabetes mellitus; DR diabetic retinopathy; NPDR non-proliferative diabetic retinopathy; PDR proliferative diabetic retinopathy; CSME clinically significant macular edema; DME diabetic macular edema; dbh dot blot hemorrhages; CWS cotton wool spot; POAG primary open angle glaucoma; C/D  cup-to-disc ratio; HVF humphrey visual field; GVF goldmann visual field; OCT optical coherence tomography; IOP intraocular pressure; BRVO Branch retinal vein occlusion; CRVO central retinal vein occlusion; CRAO central retinal artery occlusion; BRAO branch retinal artery occlusion; RT retinal tear; SB scleral buckle; PPV pars plana vitrectomy; VH Vitreous hemorrhage; PRP panretinal laser photocoagulation; IVK intravitreal kenalog; VMT vitreomacular traction; MH Macular hole;  NVD neovascularization of the disc; NVE neovascularization elsewhere; AREDS age related eye disease study; ARMD age related macular degeneration; POAG primary open angle glaucoma; EBMD epithelial/anterior basement membrane dystrophy; ACIOL anterior chamber intraocular lens; IOL intraocular lens; PCIOL posterior chamber intraocular lens; Phaco/IOL phacoemulsification with intraocular lens placement; [place] photorefractive keratectomy; LASIK laser assisted in situ keratomileusis; HTN hypertension; DM diabetes mellitus; COPD chronic obstructive  pulmonary disease

## 2022-04-12 ENCOUNTER — Other Ambulatory Visit: Payer: Self-pay

## 2022-04-12 ENCOUNTER — Other Ambulatory Visit: Payer: Self-pay | Admitting: Internal Medicine

## 2022-04-12 DIAGNOSIS — I2510 Atherosclerotic heart disease of native coronary artery without angina pectoris: Secondary | ICD-10-CM

## 2022-04-12 DIAGNOSIS — I10 Essential (primary) hypertension: Secondary | ICD-10-CM

## 2022-04-13 ENCOUNTER — Other Ambulatory Visit: Payer: Self-pay

## 2022-04-13 ENCOUNTER — Encounter (INDEPENDENT_AMBULATORY_CARE_PROVIDER_SITE_OTHER): Payer: Self-pay | Admitting: Ophthalmology

## 2022-04-13 ENCOUNTER — Ambulatory Visit (INDEPENDENT_AMBULATORY_CARE_PROVIDER_SITE_OTHER): Payer: Medicare Other | Admitting: Ophthalmology

## 2022-04-13 DIAGNOSIS — H35353 Cystoid macular degeneration, bilateral: Secondary | ICD-10-CM

## 2022-04-13 DIAGNOSIS — H35033 Hypertensive retinopathy, bilateral: Secondary | ICD-10-CM | POA: Diagnosis not present

## 2022-04-13 DIAGNOSIS — I10 Essential (primary) hypertension: Secondary | ICD-10-CM

## 2022-04-13 DIAGNOSIS — E113313 Type 2 diabetes mellitus with moderate nonproliferative diabetic retinopathy with macular edema, bilateral: Secondary | ICD-10-CM

## 2022-04-13 DIAGNOSIS — Z961 Presence of intraocular lens: Secondary | ICD-10-CM

## 2022-04-13 DIAGNOSIS — H40053 Ocular hypertension, bilateral: Secondary | ICD-10-CM | POA: Diagnosis not present

## 2022-04-13 MED ORDER — PREDNISOLONE ACETATE 1 % OP SUSP
1.0000 [drp] | Freq: Every day | OPHTHALMIC | 1 refills | Status: DC
  Filled 2022-04-13: qty 10, 90d supply, fill #0
  Filled 2022-09-23: qty 10, 90d supply, fill #1

## 2022-04-13 MED ORDER — CARVEDILOL 25 MG PO TABS
ORAL_TABLET | Freq: Two times a day (BID) | ORAL | 2 refills | Status: DC
  Filled 2022-04-13: qty 60, 30d supply, fill #0
  Filled 2022-05-26: qty 60, 30d supply, fill #1
  Filled 2022-07-14: qty 60, 30d supply, fill #2

## 2022-04-13 NOTE — Telephone Encounter (Signed)
Requested Prescriptions  Pending Prescriptions Disp Refills   carvedilol (COREG) 25 MG tablet 60 tablet 2    Sig: TAKE 1 TABLET (25 MG TOTAL) BY MOUTH 2 (TWO) TIMES DAILY WITH A MEAL.     Cardiovascular: Beta Blockers 3 Failed - 04/12/2022  7:43 AM      Failed - Cr in normal range and within 360 days    Creatinine, Ser  Date Value Ref Range Status  01/04/2022 1.33 (H) 0.57 - 1.00 mg/dL Final   Creatinine,U  Date Value Ref Range Status  07/15/2015 107.0 mg/dL Final   Creatinine, Urine  Date Value Ref Range Status  01/01/2018 52.71 mg/dL Final         Passed - AST in normal range and within 360 days    AST  Date Value Ref Range Status  01/04/2022 16 0 - 40 IU/L Final         Passed - ALT in normal range and within 360 days    ALT  Date Value Ref Range Status  01/04/2022 21 0 - 32 IU/L Final         Passed - Last BP in normal range    BP Readings from Last 1 Encounters:  02/08/22 123/79         Passed - Last Heart Rate in normal range    Pulse Readings from Last 1 Encounters:  02/08/22 87         Passed - Valid encounter within last 6 months    Recent Outpatient Visits           2 months ago Type 2 diabetes mellitus with morbid obesity ([place])   [place], Neoma Laming B, MD   3 months ago Type 2 diabetes mellitus with morbid obesity ([place])   [place], MD   3 months ago Erroneous encounter - disregard   [place], Deborah B, MD   9 months ago Type 2 diabetes mellitus with morbid obesity [facility])   [place], MD   11 months ago Subacute vaginitis   [place], MD       Future Appointments             In 1 month Wynetta Emery, Dalbert Batman, MD [place]

## 2022-04-14 ENCOUNTER — Encounter (INDEPENDENT_AMBULATORY_CARE_PROVIDER_SITE_OTHER): Payer: Self-pay | Admitting: Ophthalmology

## 2022-04-14 MED ORDER — AFLIBERCEPT 2MG/0.05ML IZ SOLN FOR KALEIDOSCOPE
2.0000 mg | INTRAVITREAL | Status: AC | PRN
  Administered 2022-04-14: 2 mg via INTRAVITREAL

## 2022-04-14 MED ORDER — FARICIMAB-SVOA 6 MG/0.05ML IZ SOLN
6.0000 mg | INTRAVITREAL | Status: AC | PRN
  Administered 2022-04-14: 6 mg via INTRAVITREAL

## 2022-05-04 NOTE — Progress Notes (Signed)
[facility] Note  05/18/2022     CHIEF COMPLAINT Patient presents for Retina Follow Up   HISTORY OF PRESENT ILLNESS: Kimberly Frazier is a 61 y.o. female who presents to the clinic today for:   HPI     Retina Follow Up   Patient presents with  Diabetic Retinopathy.  In both eyes.  Severity is moderate.  Duration of 5 weeks.  Since onset it is stable.  I, the attending physician,  performed the HPI with the patient and updated documentation appropriately.        Comments   Pt here for 5 wk ret f/u for NPDR OU. Pt states VA is the same, no changes.       Last edited by Bernarda Caffey, MD on 05/18/2022  9:43 AM.    Pt states no change in vision, she states her BP and blood sugar is improving  Referring physician: Ladell Pier, MD [place],  [place] [address]  HISTORICAL INFORMATION:   Selected notes from the MEDICAL RECORD NUMBER Referred by Dr. Rutherford Guys for concern of CME    CURRENT MEDICATIONS: Current Outpatient Medications (Ophthalmic Drugs)  Medication Sig   brimonidine (ALPHAGAN) 0.2 % ophthalmic solution Place 1 drop into both eyes 2 (two) times daily.   dorzolamide-timolol (COSOPT) 2-0.5 % ophthalmic solution Place 1 drop into both eyes 2 (two) times daily.   ketorolac (ACULAR) 0.5 % ophthalmic solution Place 1 drop into both eyes 4 (four) times daily.   prednisoLONE acetate (PRED FORTE) 1 % ophthalmic suspension Place 1 drop into both eyes daily.   No current facility-administered medications for this visit. (Ophthalmic Drugs)   Current Outpatient Medications (Other)  Medication Sig   Accu-Chek Softclix Lancets lancets Use to check blood sugar 3 times daily.   amLODipine (NORVASC) 10 MG tablet TAKE 1 TABLET (10 MG TOTAL) BY MOUTH DAILY.   aspirin EC 81 MG tablet Take 1 tablet (81 mg total) daily by mouth.   atorvastatin (LIPITOR) 80 MG tablet Take 1 tablet (80 mg total) by mouth once daily. (PLEASE  SCHEDULE AN APPT FOR FUTURE REFILLS)   Blood Glucose Monitoring Suppl (ACCU-CHEK GUIDE) w/Device KIT 1 kit by Does not apply route in the morning, at noon, and at bedtime. Use to check blood sugar 3 times daily.   carvedilol (COREG) 25 MG tablet TAKE 1 TABLET (25 MG TOTAL) BY MOUTH 2 (TWO) TIMES DAILY WITH A MEAL.   ciprofloxacin (CIPRO) 250 MG tablet Take 1 tablet (250 mg total) by mouth 2 (two) times daily.   Continuous Blood Gluc Receiver (FREESTYLE LIBRE 2 READER) DEVI Use as directed   Continuous Blood Gluc Sensor (FREESTYLE LIBRE SENSOR SYSTEM) MISC Change sensor every 2 weeks   diclofenac Sodium (VOLTAREN) 1 % GEL Apply 2 g topically 2 (two) times daily as needed.   Dulaglutide (TRULICITY) 1.5 VE/7.2CN SOPN Inject 1.5 mg into the skin once a week.   ezetimibe (ZETIA) 10 MG tablet Take 1 tablet (10 mg total) by mouth daily.   glucose blood (ACCU-CHEK GUIDE) test strip Use to check blood sugar 3 times daily.   insulin glargine (LANTUS SOLOSTAR) 100 UNIT/ML Solostar Pen Inject 58 Units into the skin daily.(Must keep upcoming office visit for refills)   insulin lispro (HUMALOG KWIKPEN) 100 UNIT/ML KwikPen Inject 28 Units into the skin in the morning and at bedtime.   Insulin Pen Needle (BD PEN NEEDLE NANO U/F) 32G X 4 MM  MISC Use as directed.   Insulin Syringe-Needle U-100 (BD VEO INSULIN SYRINGE U/F) 31G X 15/64" 0.3 ML MISC USE TO INJECT NOVOLOG THREE TIMES DAILY   isosorbide mononitrate (IMDUR) 60 MG 24 hr tablet TAKE 1 TABLET (60 MG TOTAL) BY MOUTH DAILY.   Na Sulfate-K Sulfate-Mg Sulf 17.5-3.13-1.6 GM/177ML SOLN Suprep (no substitutions)-TAKE AS DIRECTED.   nitroGLYCERIN (NITROSTAT) 0.4 MG SL tablet Place 1 tab under tongue for chest pain.  May repeat after 5 minutes x 2.  DO NOT TAKE MORE THAN 3 TABS DURING AN EPISODE OF CHEST PAIN   potassium chloride (KLOR-CON) 10 MEQ tablet Take 2 tablets (20 mEq total) by mouth daily.   No current facility-administered medications for this visit.  (Other)   REVIEW OF SYSTEMS: ROS   Positive for: Endocrine, Cardiovascular, Eyes Negative for: Constitutional, Gastrointestinal, Neurological, Skin, Genitourinary, Musculoskeletal, HENT, Respiratory, Psychiatric, Allergic/Imm, Heme/Lymph Last edited by Kingsley Spittle, COT on 05/18/2022  9:12 AM.      ALLERGIES Allergies  Allergen Reactions   Jardiance [Empagliflozin] Other (See Comments)    Frequent yeast infection   PAST MEDICAL HISTORY Past Medical History:  Diagnosis Date   Adrenal adenoma, left    Arthritis    Cancer ([place])    CKD (chronic kidney disease), stage II    Coronary artery disease 07-28-2018 followed [facility])  currently due to no insurance   per cardiac cath 02-06-2015 (positive mild lateral ishcemia on stress test)--- dLAD 80%,  mLAD 40%,  mPDA 80% (small vessel),  ostial D1 70%,  CFx with lumial irregarlities-- medical management   Depression    Diabetic neuropathy ([place])    Diabetic retinopathy ([place])    NPDR OU   Headache    History of Bell's palsy 07/2011   per pt residual facial pain on left side occasionally   History of cancer of vagina 1999   per pt completed radiation and chemo   History of sepsis 12/30/2017   positive blood culter fro E.coli   Hyperlipidemia    Hypertension    Hypertensive retinopathy    OU   Hypokalemia    Insulin dependent type 2 diabetes mellitus, uncontrolled    followed by pcp---  A1c was 11.7 on 06-15-2018 in epic   Myocardial infarction [facility])    10 yrs ago?   Nocturia    Peripheral neuropathy    PMB (postmenopausal bleeding)    Wears dentures    upper   Wears glasses    Past Surgical History:  Procedure Laterality Date   BREAST BIOPSY  2012   benign   CARDIAC CATHETERIZATION N/A 02/06/2015   Procedure: Left Heart Cath and Coronary Angiography;  Surgeon: Larey Dresser, MD;  Location: [facility];  Service: Cardiovascular;  Laterality: N/A;   CATARACT EXTRACTION Bilateral OD:  03/07/19, OS: 02/28/19   Dr. Gershon Crane   CATARACT EXTRACTION, BILATERAL     COLONOSCOPY     normal exam in [facility], [place] 10-12 years ago   EYE SURGERY Bilateral OD: 03/07/19, OS: 02/28/19   Cat Sx - Dr. Gershon Crane   HYSTEROSCOPY WITH D & C N/A 08/01/2018   Procedure: DILATATION AND CURETTAGE /HYSTEROSCOPY;  Surgeon: Mora Bellman, MD;  Location: [facility];  Service: Gynecology;  Laterality: N/A;   ROBOTIC ASSISTED TOTAL HYSTERECTOMY WITH BILATERAL SALPINGO OOPHERECTOMY N/A 11/28/2018   Procedure: XI ROBOTIC ASSISTED TOTAL HYSTERECTOMY WITH BILATERAL SALPINGO OOPHORECTOMY;  Surgeon: Everitt Amber, MD;  Location: [facility];  Service: Gynecology;  Laterality: N/A;   SENTINEL NODE BIOPSY N/A 11/28/2018   Procedure: SENTINEL NODE BIOPSY;  Surgeon: Everitt Amber, MD;  Location: [facility];  Service: Gynecology;  Laterality: N/A;   UMBILICAL HERNIA REPAIR  child   FAMILY HISTORY Family History  Problem Relation Age of Onset   Heart disease Mother    Hypertension Mother    Diabetes Mother    Cancer Father        hodgkins lymphoma   Heart disease Sister        heart attack   Diabetes Sister    Colon cancer Brother 74   Diabetes Maternal Aunt    Diabetes Maternal Uncle    Cancer Other        parent   Diabetes Other        parent   Heart disease Other        parent   Hyperlipidemia Other        parent   Hypertension Other        parent   Arthritis Other        parent   Breast cancer Neg Hx    Colon polyps Neg Hx    Esophageal cancer Neg Hx    Rectal cancer Neg Hx    Stomach cancer Neg Hx    SOCIAL HISTORY Social History   Tobacco Use   Smoking status: Never   Smokeless tobacco: Never  Vaping Use   Vaping Use: Never used  Substance Use Topics   Alcohol use: Not Currently   Drug use: Not Currently    Types: Marijuana    Comment: every 2 weeks per pt       OPHTHALMIC EXAM:  Base Eye Exam     Visual Acuity (Snellen - Linear)       Right Left   Dist [place]  20/20 20/250 -1   Dist ph [place]  NI         Tonometry (Tonopen, 9:15 AM)       Right Left   Pressure 17 13         Pupils       Pupils Dark Light Shape React APD   Right PERRL 3 2 Round Brisk None   Left PERRL 3 2 Round Brisk None         Visual Fields (Counting fingers)       Left Right    Full Full         Extraocular Movement       Right Left    Full, Ortho Full, Ortho         Neuro/Psych     Oriented x3: Yes   Mood/Affect: Normal         Dilation     Both eyes: 1.0% Mydriacyl, 2.5% Phenylephrine @ 9:15 AM           Slit Lamp and Fundus Exam     Slit Lamp Exam       Right Left   Lids/Lashes Dermatochalasis - upper lid Dermatochalasis - upper lid   Conjunctiva/Sclera Mild Melanosis Mild Melanosis   Cornea 1+ Punctate epithelial erosions 2+ central punctate epithelial erosions, decreased TBUT, mild tear film debris, well healed temporal cataract wounds   Anterior Chamber Deep, 0.5+cell/pigment Deep, 0.5+cell/pigment   Iris Round and dilated, No NVI Round and dilated, No NVI   Lens PC IOL in good position with trace PCO PC IOL in good position with 1+PCO   Anterior Vitreous Vitreous syneresis, Posterior vitreous  detachment Vitreous syneresis, Posterior vitreous detachment, vitreous condensations         Fundus Exam       Right Left   Disc Pink and Sharp Compact, Pink and Sharp, temporal PPA   C/D Ratio 0.1 0.1   Macula Good foveal reflex, persistent cystic changes / edema inferior macula -- slightly improved, scattered Microaneurysms Blunted foveal reflex, central edema - persistent / slightly improved, +exudates/MA/IRH -- greatest temporal macula -- improved, +central atrophy, no heme   Vessels attenuated, Tortuous attenuated, Tortuous   Periphery Attached, scattered drusen, rare, scattered DBH, reticular degeneration Attached, scattered drusen, rare, scattered DBH, reticular degeneration           IMAGING AND PROCEDURES  Imaging  and Procedures for 05/18/2022  OCT, Retina - OU - Both Eyes       Right Eye Quality was good. Central Foveal Thickness: 262. Progression has improved. Findings include no SRF, abnormal foveal contour, intraretinal hyper-reflective material, intraretinal fluid, vitreomacular adhesion (Mild interval improvement in IRF/cystic changes inferior macula and fovea ).   Left Eye Quality was good. Central Foveal Thickness: 299. Progression has improved. Findings include no SRF, abnormal foveal contour, subretinal hyper-reflective material, intraretinal hyper-reflective material, intraretinal fluid, outer retinal atrophy, vitreomacular adhesion (interval improvement in IRF/IRHM inferior and temporal mac and fovea, +central ORA).   Notes *Images captured and stored on drive  Diagnosis / Impression:  Macular edema OU - likely combination of CME + DME OU OD: Mild interval improvement in IRF inferior/cystic changes inferior macula and fovea  OS: interval improvement in IRF/IRHM inferior and temporal mac and fovea, +central ORA  Clinical management:  See below  Abbreviations: NFP - Normal foveal profile. CME - cystoid macular edema. PED - pigment epithelial detachment. IRF - intraretinal fluid. SRF - subretinal fluid. EZ - ellipsoid zone. ERM - epiretinal membrane. ORA - outer retinal atrophy. ORT - outer retinal tubulation. SRHM - subretinal hyper-reflective material      Intravitreal Injection, Pharmacologic Agent - OS - Left Eye       Time Out 05/18/2022. 9:22 AM. Confirmed correct patient, procedure, site, and patient consented.   Anesthesia Topical anesthesia was used. Anesthetic medications included Lidocaine 2%, Proparacaine 0.5%.   Procedure Preparation included 5% betadine to ocular surface, eyelid speculum. A (32g) needle was used.   Injection: 6 mg faricimab-svoa 6 MG/0.05ML   Route: Intravitreal, Site: Left Eye   NDC: S6832610, Lot: [id number], Expiration date: 05/30/2024,  Waste: 0 mL   Post-op Post injection exam found visual acuity of at least counting fingers. The patient tolerated the procedure well. There were no complications. The patient received written and verbal post procedure care education.      Intravitreal Injection, Pharmacologic Agent - OD - Right Eye       Time Out 05/18/2022. 9:23 AM. Confirmed correct patient, procedure, site, and patient consented.   Anesthesia Topical anesthesia was used. Anesthetic medications included Lidocaine 2%, Proparacaine 0.5%.   Procedure Preparation included 5% betadine to ocular surface, eyelid speculum. A (32g) needle was used.   Injection: 2 mg aflibercept 2 MG/0.05ML   Route: Intravitreal, Site: Right Eye   NDC: A3590391, Lot: [id number], Expiration date: 07/29/2023, Waste: 0 mL   Post-op Post injection exam found visual acuity of at least counting fingers. The patient tolerated the procedure well. There were no complications. The patient received written and verbal post procedure care education.            ASSESSMENT/PLAN:    ICD-10-CM  1. Moderate nonproliferative diabetic retinopathy of both eyes with macular edema associated with type 2 diabetes mellitus (HCC)  E11.3313 OCT, Retina - OU - Both Eyes    Intravitreal Injection, Pharmacologic Agent - OS - Left Eye    Intravitreal Injection, Pharmacologic Agent - OD - Right Eye    faricimab-svoa (VABYSMO) 69m/0.05mL intravitreal injection    aflibercept (EYLEA) SOLN 2 mg    2. CME (cystoid macular edema), bilateral  H35.353     3. Essential hypertension  I10     4. Hypertensive retinopathy of both eyes  H35.033     5. Pseudophakia of both eyes  Z96.1     6. Bilateral ocular hypertension  H40.053      1,2. Moderate Non-proliferative diabetic retinopathy, OU OD with combination DME/CME  - last A1c 9.6 on 08.07.23, 10.1 on 1.31.23             - h/o delayed to follow up from September 2021 to April 2022--7 mos instead of 4 wks**              - exam shows scattered [place] edema and tortuous blood vessels OU             - FA (10.23.20) shows leaking MA OU and paracentral petaloid hyperfluorecence OD --> CME/[place]             - OCT shows diabetic macular edema OU; OD with CME/[place] component contributing as well - s/p IVA OD #1 (11.06.20), #2 (12.18.20), #3 (02.24.21), #4 (03.24.21), #5 (04.21.21), #6 (5.26.21), #7 (07.26.21), #8 (08.25.21), #9 (09.23.21), #10 (04.26.22) - s/p IVA OS #1 (10.23.20), #2 (11.18.20), #3 (12.18.20), #4 (02.24.21), #5 (03.24.21) - s/p IVE OD #1 (05.25.22), #2 (07.01.22), #3 (07.29.22), #4 (08.29.22 - JDM), #5 (09.26.22), #6 (10.24.22), #7 (11.21.22), #8 (12.19.22), #9 (01.23.23), #10 (02.20.23), #11 (03.22.23), #12 (04.19.23), #13 (05.17.23), #14 (06.14.23), #15 (08.04.23), #16 (09.01.23), #17 (10.10.23), #18 (11.14.23) - s/p IVE OS #1 (04.21.21), #2 (5.26.21), #3 (07.26.21), #4 (08.25.21), #5 (09.23.21), #6 (04.26.22), #7 (05.25.22), #8 (07.01.22), #9 (07.29.22), #10 (08.29.22 - JDM), #11 (09.26.22), #12 (10.24.22) -- IVE resistance - s/p IV Vabysmo OS #1 (11.21.22, sample), #2 (12.19.22), #3 (01.23.23), #4 (02.20.23), #5 (03.22.23), #6 (04.19.23), #7 (05.17.23), #8 (06.14.23, sample), #9 (08.04.23), #10 (9.01.23), #11 (10.10.23), #12 (11.14.23) - BCVA OD 20/20; OS 20/250 -- improved - OCT today: OD: Mild interval improvement in IRF/cystic changes inferior macula and fovea; OS: interval improvement in IRF/IRHM inferior and temporal mac and fovea, +central ORA at 5 weeks             - recommend IVE OD #19 and IV Vabysmo OS #13 today, 12.19.23 with follow up 5 weeks  - pt wishes to proceed w/ injections             - RBA of procedure discussed, questions answered             - informed consent obtained             - [place] informed consent form signed and scanned on 12.18.2020       - Eylea OU informed consent form re-signed and scanned on 05.17.23             - Vabysmo OS consent form signed  and scanned on 11.21.22             - see procedure note             - Eylea4U benefits investigation started, 04.21.21             -  cont Pred Forte qd OU, continue Ketorolac QID OU             - f/u in 5 weeks, DFE, OCT, possible injection OU   3,4. Hypertensive retinopathy OU             - discussed importance of tight BP control             - continue to monitor   5. Pseudophakia OU            - s/p CE/IOL OU Gershon Crane)                        OD: 03/07/2019                        OS: 02/28/2019             - IOLs in good position             - CME OU as above             - continue to monitor   6. Ocular Hypertension OU             - IOP: 17,13 - h/o steroid response             - cont Cosopt BID OU and Brimonidine BID OU  Ophthalmic Meds Ordered this visit:  Meds ordered this encounter  Medications   faricimab-svoa (VABYSMO) 77m/0.05mL intravitreal injection   aflibercept (EYLEA) SOLN 2 mg     Return in about 5 weeks (around 06/22/2022) for f/u NPDR OU, DFE, OCT.  There are no Patient Instructions on file for this visit.  Explained the diagnoses, plan, and follow up with the patient and they expressed understanding.  Patient expressed understanding of the importance of proper follow up care.    This document serves as a record of services personally performed by BGardiner Sleeper MD, PhD. It was created on their behalf by CRenaldo Reel CProsperityan ophthalmic technician. The creation of this record is the provider's dictation and/or activities during the visit.    Electronically signed by:  CRenaldo Reel COT  12.05.23 9:52 AM.  This document serves as a record of services personally performed by BGardiner Sleeper MD, PhD. It was created on their behalf by ASan Jetty BOwens Shark OA an ophthalmic technician. The creation of this record is the provider's dictation and/or activities during the visit.    Electronically signed by: ASan Jetty BOwens Shark ONew [place]12.19.2023 9:52 AM  BGardiner Sleeper M.D., Ph.D. Diseases & Surgery of the Retina and Vitreous [facility] I have reviewed the above documentation for accuracy and completeness, and I agree with the above. BGardiner Sleeper M.D., Ph.D. 05/18/22 10:00 AM   Abbreviations: M myopia (nearsighted); A astigmatism; H hyperopia (farsighted); P presbyopia; Mrx spectacle prescription;  CTL contact lenses; OD right eye; OS left eye; OU both eyes  XT exotropia; ET esotropia; PEK punctate epithelial keratitis; PEE punctate epithelial erosions; DES dry eye syndrome; MGD meibomian gland dysfunction; ATs artificial tears; PFAT's preservative free artificial tears; [place] sclerotic cataract; PSC posterior subcapsular cataract; ERM epi-retinal membrane; PVD posterior vitreous detachment; RD retinal detachment; DM diabetes mellitus; DR diabetic retinopathy; NPDR non-proliferative diabetic retinopathy; PDR proliferative diabetic retinopathy; CSME clinically significant macular edema; DME diabetic macular edema; dbh dot blot hemorrhages; CWS cotton wool spot; POAG primary open angle glaucoma; C/D cup-to-disc ratio;  HVF humphrey visual field; GVF goldmann visual field; OCT optical coherence tomography; IOP intraocular pressure; BRVO Branch retinal vein occlusion; CRVO central retinal vein occlusion; CRAO central retinal artery occlusion; BRAO branch retinal artery occlusion; RT retinal tear; SB scleral buckle; PPV pars plana vitrectomy; VH Vitreous hemorrhage; PRP panretinal laser photocoagulation; IVK intravitreal kenalog; VMT vitreomacular traction; MH Macular hole;  NVD neovascularization of the disc; NVE neovascularization elsewhere; AREDS age related eye disease study; ARMD age related macular degeneration; POAG primary open angle glaucoma; EBMD epithelial/anterior basement membrane dystrophy; ACIOL anterior chamber intraocular lens; IOL intraocular lens; PCIOL posterior chamber intraocular lens; Phaco/IOL  phacoemulsification with intraocular lens placement; [place] photorefractive keratectomy; LASIK laser assisted in situ keratomileusis; HTN hypertension; DM diabetes mellitus; COPD chronic obstructive pulmonary disease

## 2022-05-06 ENCOUNTER — Other Ambulatory Visit: Payer: Self-pay

## 2022-05-06 ENCOUNTER — Other Ambulatory Visit: Payer: Self-pay | Admitting: Internal Medicine

## 2022-05-06 DIAGNOSIS — Z794 Long term (current) use of insulin: Secondary | ICD-10-CM

## 2022-05-06 MED ORDER — LANTUS SOLOSTAR 100 UNIT/ML ~~LOC~~ SOPN
58.0000 [IU] | PEN_INJECTOR | Freq: Every day | SUBCUTANEOUS | 0 refills | Status: DC
  Filled 2022-05-06: qty 15, 25d supply, fill #0

## 2022-05-07 ENCOUNTER — Other Ambulatory Visit: Payer: Self-pay

## 2022-05-13 ENCOUNTER — Other Ambulatory Visit: Payer: Self-pay

## 2022-05-18 ENCOUNTER — Ambulatory Visit (INDEPENDENT_AMBULATORY_CARE_PROVIDER_SITE_OTHER): Payer: Medicare Other | Admitting: Ophthalmology

## 2022-05-18 ENCOUNTER — Encounter (INDEPENDENT_AMBULATORY_CARE_PROVIDER_SITE_OTHER): Payer: Self-pay | Admitting: Ophthalmology

## 2022-05-18 DIAGNOSIS — Z961 Presence of intraocular lens: Secondary | ICD-10-CM | POA: Diagnosis not present

## 2022-05-18 DIAGNOSIS — H35353 Cystoid macular degeneration, bilateral: Secondary | ICD-10-CM | POA: Diagnosis not present

## 2022-05-18 DIAGNOSIS — E113313 Type 2 diabetes mellitus with moderate nonproliferative diabetic retinopathy with macular edema, bilateral: Secondary | ICD-10-CM | POA: Diagnosis not present

## 2022-05-18 DIAGNOSIS — H40053 Ocular hypertension, bilateral: Secondary | ICD-10-CM | POA: Diagnosis not present

## 2022-05-18 DIAGNOSIS — H35033 Hypertensive retinopathy, bilateral: Secondary | ICD-10-CM | POA: Diagnosis not present

## 2022-05-18 DIAGNOSIS — I10 Essential (primary) hypertension: Secondary | ICD-10-CM | POA: Diagnosis not present

## 2022-05-18 MED ORDER — AFLIBERCEPT 2MG/0.05ML IZ SOLN FOR KALEIDOSCOPE
2.0000 mg | INTRAVITREAL | Status: AC | PRN
  Administered 2022-05-18: 2 mg via INTRAVITREAL

## 2022-05-18 MED ORDER — FARICIMAB-SVOA 6 MG/0.05ML IZ SOLN
6.0000 mg | INTRAVITREAL | Status: AC | PRN
  Administered 2022-05-18: 6 mg via INTRAVITREAL

## 2022-05-26 ENCOUNTER — Other Ambulatory Visit: Payer: Self-pay | Admitting: Cardiology

## 2022-05-26 ENCOUNTER — Other Ambulatory Visit: Payer: Self-pay

## 2022-05-26 ENCOUNTER — Other Ambulatory Visit: Payer: Self-pay | Admitting: Internal Medicine

## 2022-05-26 DIAGNOSIS — E1165 Type 2 diabetes mellitus with hyperglycemia: Secondary | ICD-10-CM

## 2022-05-26 MED ORDER — ATORVASTATIN CALCIUM 80 MG PO TABS
80.0000 mg | ORAL_TABLET | Freq: Every day | ORAL | 0 refills | Status: DC
  Filled 2022-05-26: qty 90, 90d supply, fill #0

## 2022-05-26 MED ORDER — LANTUS SOLOSTAR 100 UNIT/ML ~~LOC~~ SOPN
58.0000 [IU] | PEN_INJECTOR | Freq: Every day | SUBCUTANEOUS | 0 refills | Status: DC
  Filled 2022-05-26: qty 15, 25d supply, fill #0

## 2022-05-26 NOTE — Telephone Encounter (Signed)
Rx(s) sent to pharmacy electronically.  

## 2022-05-28 ENCOUNTER — Other Ambulatory Visit: Payer: Self-pay

## 2022-06-10 ENCOUNTER — Ambulatory Visit: Payer: Self-pay | Admitting: Internal Medicine

## 2022-06-10 NOTE — Progress Notes (Shared)
[facility] Note  06/22/2022     CHIEF COMPLAINT Patient presents for No chief complaint on file.   HISTORY OF PRESENT ILLNESS: Kimberly Frazier is a 62 y.o. female who presents to the clinic today for:    Pt states no change in vision, she states her BP and blood sugar is improving  Referring physician: Ladell Pier, MD [address] [place],  [place] [address]  HISTORICAL INFORMATION:   Selected notes from the MEDICAL RECORD NUMBER Referred by Dr. Rutherford Guys for concern of CME    CURRENT MEDICATIONS: Current Outpatient Medications (Ophthalmic Drugs)  Medication Sig   brimonidine (ALPHAGAN) 0.2 % ophthalmic solution Place 1 drop into both eyes 2 (two) times daily.   dorzolamide-timolol (COSOPT) 2-0.5 % ophthalmic solution Place 1 drop into both eyes 2 (two) times daily.   ketorolac (ACULAR) 0.5 % ophthalmic solution Place 1 drop into both eyes 4 (four) times daily.   prednisoLONE acetate (PRED FORTE) 1 % ophthalmic suspension Place 1 drop into both eyes daily.   No current facility-administered medications for this visit. (Ophthalmic Drugs)   Current Outpatient Medications (Other)  Medication Sig   Accu-Chek Softclix Lancets lancets Use to check blood sugar 3 times daily.   amLODipine (NORVASC) 10 MG tablet TAKE 1 TABLET (10 MG TOTAL) BY MOUTH DAILY.   aspirin EC 81 MG tablet Take 1 tablet (81 mg total) daily by mouth.   atorvastatin (LIPITOR) 80 MG tablet Take 1 tablet (80 mg total) by mouth once daily. (PLEASE SCHEDULE AN APPT FOR FUTURE REFILLS)   Blood Glucose Monitoring Suppl (ACCU-CHEK GUIDE) w/Device KIT 1 kit by Does not apply route in the morning, at noon, and at bedtime. Use to check blood sugar 3 times daily.   carvedilol (COREG) 25 MG tablet TAKE 1 TABLET (25 MG TOTAL) BY MOUTH 2 (TWO) TIMES DAILY WITH A MEAL.   ciprofloxacin (CIPRO) 250 MG tablet Take 1 tablet (250 mg total) by mouth 2 (two) times daily.   Continuous  Blood Gluc Receiver (FREESTYLE LIBRE 2 READER) DEVI Use as directed   Continuous Blood Gluc Sensor (FREESTYLE LIBRE SENSOR SYSTEM) MISC Change sensor every 2 weeks   diclofenac Sodium (VOLTAREN) 1 % GEL Apply 2 g topically 2 (two) times daily as needed.   Dulaglutide (TRULICITY) 1.5 AQ/7.6AU SOPN Inject 1.5 mg into the skin once a week.   ezetimibe (ZETIA) 10 MG tablet Take 1 tablet (10 mg total) by mouth daily.   glucose blood (ACCU-CHEK GUIDE) test strip Use to check blood sugar 3 times daily.   insulin glargine (LANTUS SOLOSTAR) 100 UNIT/ML Solostar Pen Inject 58 Units into the skin daily.(Must keep upcoming office visit for refills)   insulin lispro (HUMALOG KWIKPEN) 100 UNIT/ML KwikPen Inject 28 Units into the skin in the morning and at bedtime.   Insulin Pen Needle (BD PEN NEEDLE NANO U/F) 32G X 4 MM MISC Use as directed.   Insulin Syringe-Needle U-100 (BD VEO INSULIN SYRINGE U/F) 31G X 15/64" 0.3 ML MISC USE TO INJECT NOVOLOG THREE TIMES DAILY   isosorbide mononitrate (IMDUR) 60 MG 24 hr tablet TAKE 1 TABLET (60 MG TOTAL) BY MOUTH DAILY.   Na Sulfate-K Sulfate-Mg Sulf 17.5-3.13-1.6 GM/177ML SOLN Suprep (no substitutions)-TAKE AS DIRECTED.   nitroGLYCERIN (NITROSTAT) 0.4 MG SL tablet Place 1 tab under tongue for chest pain.  May repeat after 5 minutes x 2.  DO NOT TAKE MORE THAN 3 TABS DURING AN EPISODE OF  CHEST PAIN   potassium chloride (KLOR-CON) 10 MEQ tablet Take 2 tablets (20 mEq total) by mouth daily.   No current facility-administered medications for this visit. (Other)   REVIEW OF SYSTEMS:    ALLERGIES Allergies  Allergen Reactions   Jardiance [Empagliflozin] Other (See Comments)    Frequent yeast infection   PAST MEDICAL HISTORY Past Medical History:  Diagnosis Date   Adrenal adenoma, left    Arthritis    Cancer ([place])    CKD (chronic kidney disease), stage II    Coronary artery disease 07-28-2018 followed [facility]  currently due to no insurance    per cardiac cath 02-06-2015 (positive mild lateral ishcemia on stress test)--- dLAD 80%,  mLAD 40%,  mPDA 80% (small vessel),  ostial D1 70%,  CFx with lumial irregarlities-- medical management   Depression    Diabetic neuropathy ([place])    Diabetic retinopathy ([place])    NPDR OU   Headache    History of Bell's palsy 07/2011   per pt residual facial pain on left side occasionally   History of cancer of vagina 1999   per pt completed radiation and chemo   History of sepsis 12/30/2017   positive blood culter fro E.coli   Hyperlipidemia    Hypertension    Hypertensive retinopathy    OU   Hypokalemia    Insulin dependent type 2 diabetes mellitus, uncontrolled    followed by pcp---  A1c was 11.7 on 06-15-2018 in epic   Myocardial infarction [facility])    10 yrs ago?   Nocturia    Peripheral neuropathy    PMB (postmenopausal bleeding)    Wears dentures    upper   Wears glasses    Past Surgical History:  Procedure Laterality Date   BREAST BIOPSY  2012   benign   CARDIAC CATHETERIZATION N/A 02/06/2015   Procedure: Left Heart Cath and Coronary Angiography;  Surgeon: Larey Dresser, MD;  Location: [place] [facility];  Service: Cardiovascular;  Laterality: N/A;   CATARACT EXTRACTION Bilateral OD: 03/07/19, OS: 02/28/19   Dr. Gershon Crane   CATARACT EXTRACTION, BILATERAL     COLONOSCOPY     normal exam in [place], [place] 10-12 years ago   EYE SURGERY Bilateral OD: 03/07/19, OS: 02/28/19   Cat Sx - Dr. Gershon Crane   HYSTEROSCOPY WITH D & C N/A 08/01/2018   Procedure: DILATATION AND CURETTAGE /HYSTEROSCOPY;  Surgeon: Mora Bellman, MD;  Location: [place];  Service: Gynecology;  Laterality: N/A;   ROBOTIC ASSISTED TOTAL HYSTERECTOMY WITH BILATERAL SALPINGO OOPHERECTOMY N/A 11/28/2018   Procedure: XI ROBOTIC ASSISTED TOTAL HYSTERECTOMY WITH BILATERAL SALPINGO OOPHORECTOMY;  Surgeon: Everitt Amber, MD;  Location: [facility];  Service: Gynecology;  Laterality: N/A;   SENTINEL NODE BIOPSY  N/A 11/28/2018   Procedure: SENTINEL NODE BIOPSY;  Surgeon: Everitt Amber, MD;  Location: [facility];  Service: Gynecology;  Laterality: N/A;   UMBILICAL HERNIA REPAIR  child   FAMILY HISTORY Family History  Problem Relation Age of Onset   Heart disease Mother    Hypertension Mother    Diabetes Mother    Cancer Father        hodgkins lymphoma   Heart disease Sister        heart attack   Diabetes Sister    Colon cancer Brother 63   Diabetes Maternal Aunt    Diabetes Maternal Uncle    Cancer Other        parent   Diabetes Other  parent   Heart disease Other        parent   Hyperlipidemia Other        parent   Hypertension Other        parent   Arthritis Other        parent   Breast cancer Neg Hx    Colon polyps Neg Hx    Esophageal cancer Neg Hx    Rectal cancer Neg Hx    Stomach cancer Neg Hx    SOCIAL HISTORY Social History   Tobacco Use   Smoking status: Never   Smokeless tobacco: Never  Vaping Use   Vaping Use: Never used  Substance Use Topics   Alcohol use: Not Currently   Drug use: Not Currently    Types: Marijuana    Comment: every 2 weeks per pt       OPHTHALMIC EXAM:  Not recorded    IMAGING AND PROCEDURES  Imaging and Procedures for 06/22/2022          ASSESSMENT/PLAN:    ICD-10-CM   1. Moderate nonproliferative diabetic retinopathy of both eyes with macular edema associated with type 2 diabetes mellitus ([place])  E56.3149     2. CME (cystoid macular edema), bilateral  H35.353     3. Essential hypertension  I10     4. Hypertensive retinopathy of both eyes  H35.033     5. Pseudophakia of both eyes  Z96.1     6. Bilateral ocular hypertension  H40.053       1,2. Moderate Non-proliferative diabetic retinopathy, OU OD with combination DME/CME  - last A1c 9.6 on 08.07.23, 10.1 on 1.31.23             - h/o delayed to follow up from September 2021 to April 2022--7 mos instead of 4 wks**             - exam shows scattered [place], edema and  tortuous blood vessels OU             - FA (10.23.20) shows leaking MA OU and paracentral petaloid hyperfluorecence OD --> CME/[place]             - OCT shows diabetic macular edema OU; OD with CME/[place] component contributing as well - s/p IVA OD #1 (11.06.20), #2 (12.18.20), #3 (02.24.21), #4 (03.24.21), #5 (04.21.21), #6 (5.26.21), #7 (07.26.21), #8 (08.25.21), #9 (09.23.21), #10 (04.26.22) - s/p IVA OS #1 (10.23.20), #2 (11.18.20), #3 (12.18.20), #4 (02.24.21), #5 (03.24.21) - s/p IVE OD #1 (05.25.22), #2 (07.01.22), #3 (07.29.22), #4 (08.29.22 - JDM), #5 (09.26.22), #6 (10.24.22), #7 (11.21.22), #8 (12.19.22), #9 (01.23.23), #10 (02.20.23), #11 (03.22.23), #12 (04.19.23), #13 (05.17.23), #14 (06.14.23), #15 (08.04.23), #16 (09.01.23), #17 (10.10.23), #18 (11.14.23), #19 (12.19.23) - s/p IVE OS #1 (04.21.21), #2 (5.26.21), #3 (07.26.21), #4 (08.25.21), #5 (09.23.21), #6 (04.26.22), #7 (05.25.22), #8 (07.01.22), #9 (07.29.22), #10 (08.29.22 - JDM), #11 (09.26.22), #12 (10.24.22) -- IVE resistance - s/p IV Vabysmo OS #1 (11.21.22, sample), #2 (12.19.22), #3 (01.23.23), #4 (02.20.23), #5 (03.22.23), #6 (04.19.23), #7 (05.17.23), #8 (06.14.23, sample), #9 (08.04.23), #10 (9.01.23), #11 (10.10.23), #12 (11.14.23), #13 (12.19.23) - BCVA OD 20/20; OS 20/250 -- improved - OCT today: OD: Mild interval improvement in IRF/cystic changes inferior macula and fovea; OS: interval improvement in IRF/IRHM inferior and temporal mac and fovea, +central ORA at 5 weeks             - recommend IVE OD #20 and IV Vabysmo OS #14 today, 1.23.24 with follow  up 5 weeks  - pt wishes to proceed w/ injections             - RBA of procedure discussed, questions answered             - informed consent obtained             - [place] informed consent form signed and scanned on 12.18.2020       [facility] OU informed consent form re-signed and scanned on 05.17.23             - Vabysmo OS consent form signed and scanned on  11.21.22             - see procedure note             - [employer] benefits investigation started, 04.21.21             - cont Pred Forte qd OU, continue Ketorolac QID OU             - f/u in 5 weeks, DFE, OCT, possible injection OU   3,4. Hypertensive retinopathy OU             - discussed importance of tight BP control             - continue to monitor   5. Pseudophakia OU            - s/p CE/IOL OU Gershon Crane)                        OD: 03/07/2019                        OS: 02/28/2019             - IOLs in good position             - CME OU as above             - continue to monitor   6. Ocular Hypertension OU             - IOP: 17,13 - h/o steroid response             - cont Cosopt BID OU and Brimonidine BID OU  Ophthalmic Meds Ordered this visit:  No orders of the defined types were placed in this encounter.    No follow-ups on file.  There are no Patient Instructions on file for this visit.  Explained the diagnoses, plan, and follow up with the patient and they expressed understanding.  Patient expressed understanding of the importance of proper follow up care.    This document serves as a record of services personally performed by Gardiner Sleeper, MD, PhD. It was created on their behalf by Renaldo Reel, [place] an ophthalmic technician. The creation of this record is the provider's dictation and/or activities during the visit.    Electronically signed by:  Renaldo Reel, COT  1.11.24 8:47 AM.   Gardiner Sleeper, M.D., Ph.D. Diseases & Surgery of the Retina and Vitreous [facility]: M myopia (nearsighted); A astigmatism; H hyperopia (farsighted); P presbyopia; Mrx spectacle prescription;  CTL contact lenses; OD right eye; OS left eye; OU both eyes  XT exotropia; ET esotropia; PEK punctate epithelial keratitis; PEE punctate epithelial erosions; DES dry eye syndrome; MGD meibomian gland dysfunction; ATs artificial tears; PFAT's  preservative free artificial tears; [address] nuclear sclerotic  cataract; PSC posterior subcapsular cataract; ERM epi-retinal membrane; PVD posterior vitreous detachment; RD retinal detachment; DM diabetes mellitus; DR diabetic retinopathy; NPDR non-proliferative diabetic retinopathy; PDR proliferative diabetic retinopathy; CSME clinically significant macular edema; DME diabetic macular edema; dbh dot blot hemorrhages; CWS cotton wool spot; POAG primary open angle glaucoma; C/D cup-to-disc ratio; HVF humphrey visual field; GVF goldmann visual field; OCT optical coherence tomography; IOP intraocular pressure; BRVO Branch retinal vein occlusion; CRVO central retinal vein occlusion; CRAO central retinal artery occlusion; BRAO branch retinal artery occlusion; RT retinal tear; SB scleral buckle; PPV pars plana vitrectomy; VH Vitreous hemorrhage; PRP panretinal laser photocoagulation; IVK intravitreal kenalog; VMT vitreomacular traction; MH Macular hole;  NVD neovascularization of the disc; NVE neovascularization elsewhere; AREDS age related eye disease study; ARMD age related macular degeneration; POAG primary open angle glaucoma; EBMD epithelial/anterior basement membrane dystrophy; ACIOL anterior chamber intraocular lens; IOL intraocular lens; PCIOL posterior chamber intraocular lens; Phaco/IOL phacoemulsification with intraocular lens placement; [place] photorefractive keratectomy; LASIK laser assisted in situ keratomileusis; HTN hypertension; DM diabetes mellitus; COPD chronic obstructive pulmonary disease

## 2022-06-17 IMAGING — CR DG CHEST 2V
2 series · 2 of 2 positions shown · non-contrast
Comparison: 12/30/2017

CLINICAL DATA: LEFT side chest pain since yesterday, history of
coronary artery disease post MI, diabetes mellitus, hypertension

EXAM:
CHEST - 2 VIEW

[chest pa]
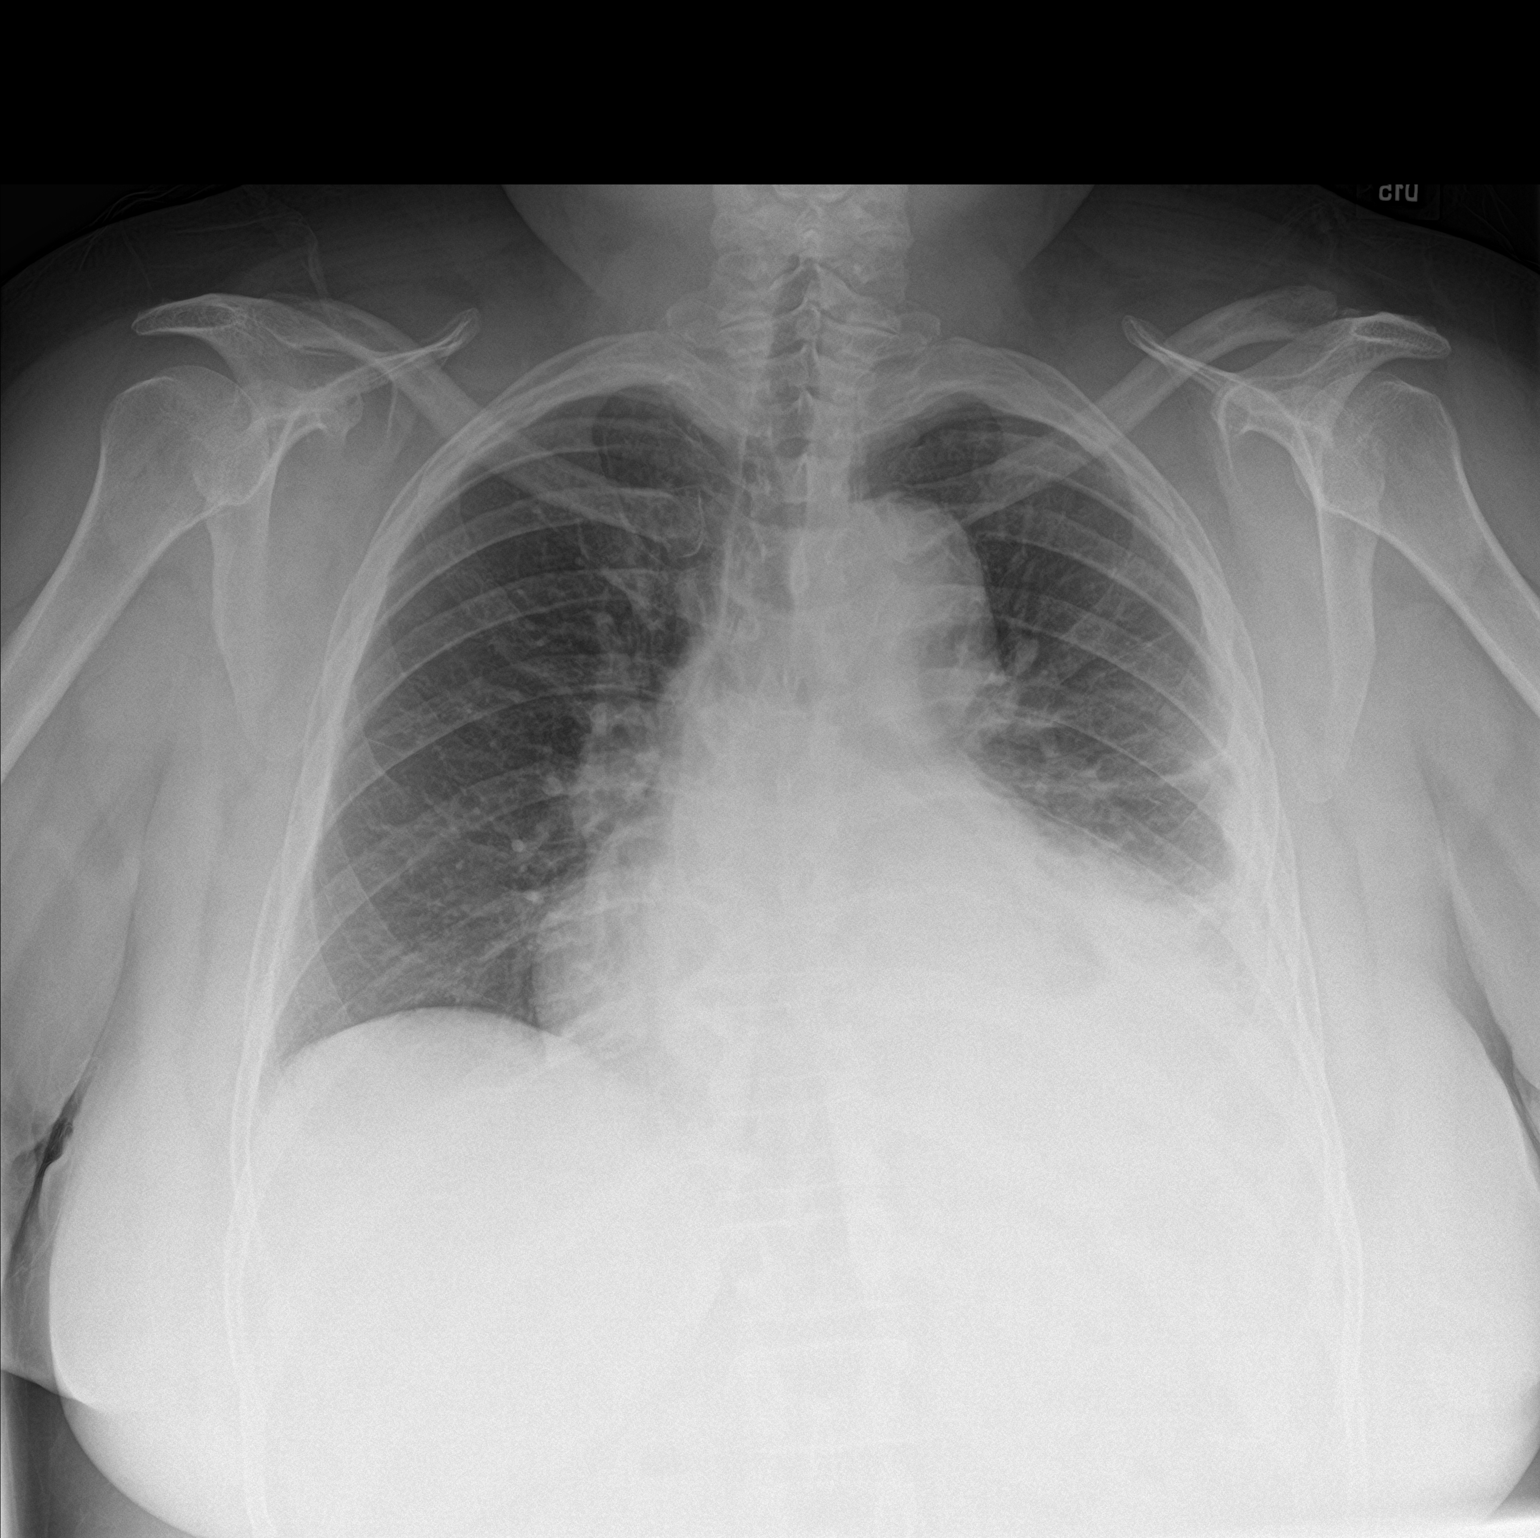

[chest lat]
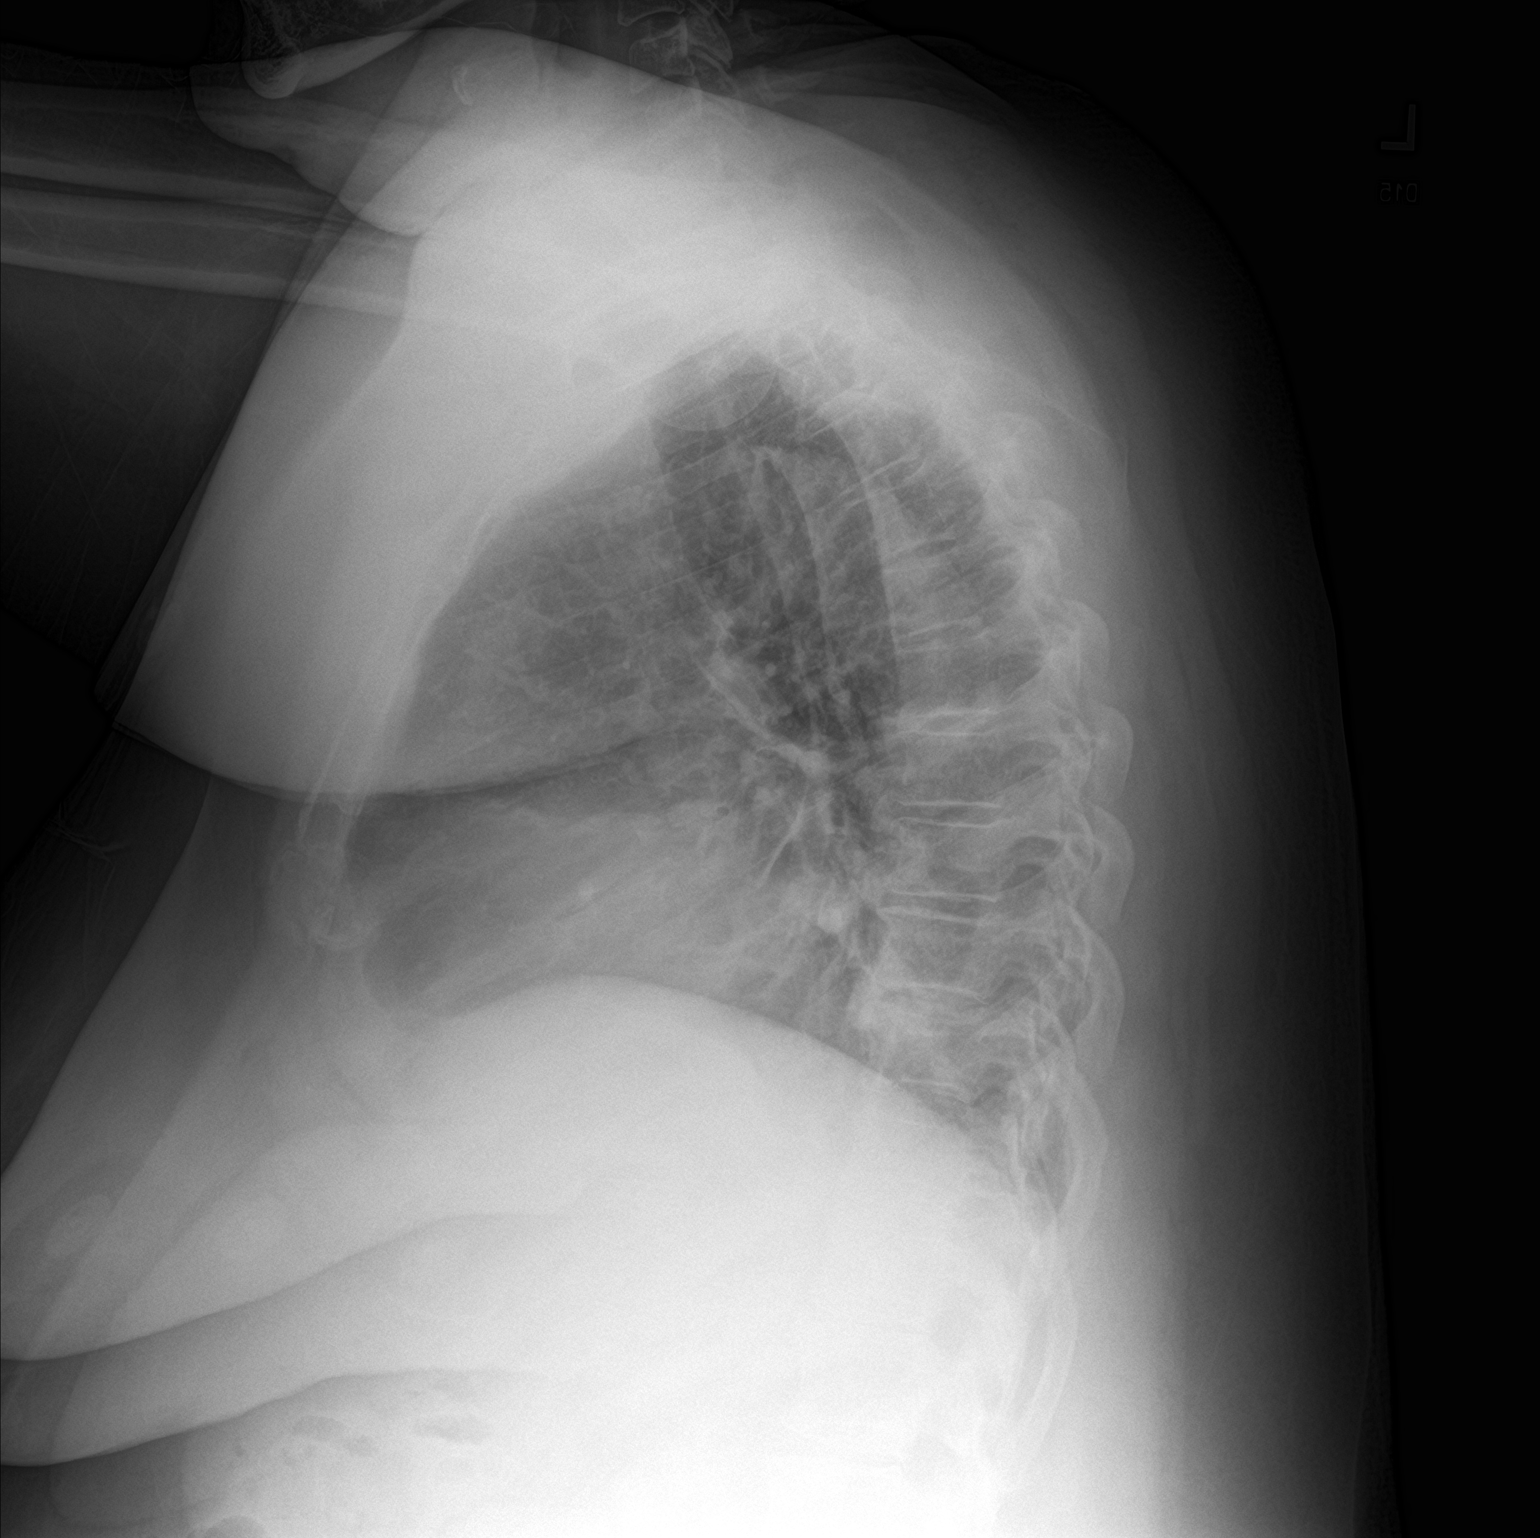

[2 of 2 positions shown; findings below may reference images not displayed]

FINDINGS: Enlargement of cardiac silhouette.

Mediastinal contours and pulmonary vascularity normal.

Atelectasis versus consolidation at LEFT lung base.

Probable associated small LEFT pleural effusion.

Remaining lungs clear.

No pneumothorax or acute osseous findings.

Scattered endplate spur formation thoracic spine.
IMPRESSION: Atelectasis versus consolidation at LEFT lung base with probable
associated small LEFT pleural effusion.

Enlargement of cardiac silhouette.

## 2022-06-18 ENCOUNTER — Other Ambulatory Visit: Payer: Self-pay

## 2022-06-18 ENCOUNTER — Other Ambulatory Visit: Payer: Self-pay | Admitting: Internal Medicine

## 2022-06-18 DIAGNOSIS — E1165 Type 2 diabetes mellitus with hyperglycemia: Secondary | ICD-10-CM

## 2022-06-18 MED ORDER — LANTUS SOLOSTAR 100 UNIT/ML ~~LOC~~ SOPN
58.0000 [IU] | PEN_INJECTOR | Freq: Every day | SUBCUTANEOUS | 0 refills | Status: DC
  Filled 2022-06-18: qty 15, 25d supply, fill #0

## 2022-06-21 ENCOUNTER — Other Ambulatory Visit: Payer: Self-pay

## 2022-06-22 ENCOUNTER — Encounter (INDEPENDENT_AMBULATORY_CARE_PROVIDER_SITE_OTHER): Payer: Self-pay

## 2022-06-22 ENCOUNTER — Encounter (INDEPENDENT_AMBULATORY_CARE_PROVIDER_SITE_OTHER): Payer: 59 | Admitting: Ophthalmology

## 2022-06-22 DIAGNOSIS — Z961 Presence of intraocular lens: Secondary | ICD-10-CM

## 2022-06-22 DIAGNOSIS — H40053 Ocular hypertension, bilateral: Secondary | ICD-10-CM

## 2022-06-22 DIAGNOSIS — H35353 Cystoid macular degeneration, bilateral: Secondary | ICD-10-CM

## 2022-06-22 DIAGNOSIS — E113313 Type 2 diabetes mellitus with moderate nonproliferative diabetic retinopathy with macular edema, bilateral: Secondary | ICD-10-CM

## 2022-06-22 DIAGNOSIS — I10 Essential (primary) hypertension: Secondary | ICD-10-CM

## 2022-06-22 DIAGNOSIS — H35033 Hypertensive retinopathy, bilateral: Secondary | ICD-10-CM

## 2022-06-24 ENCOUNTER — Other Ambulatory Visit: Payer: Self-pay

## 2022-06-25 ENCOUNTER — Other Ambulatory Visit: Payer: Self-pay | Admitting: Internal Medicine

## 2022-06-25 DIAGNOSIS — E876 Hypokalemia: Secondary | ICD-10-CM

## 2022-06-25 MED ORDER — POTASSIUM CHLORIDE ER 10 MEQ PO TBCR: 20.0000 meq | EXTENDED_RELEASE_TABLET | Freq: Every day | ORAL | 0 refills | Status: DC

## 2022-06-25 NOTE — Telephone Encounter (Signed)
Medication Refill - Medication:  potassium chloride (KLOR-CON) 10 MEQ tablet  Has the patient contacted their pharmacy? yes (Agent: If no, request that the patient contact the pharmacy for the refill. If patient does not wish to contact the pharmacy document the reason why and proceed with request.) (Agent: If yes, when and what did the pharmacy advise?)contact pcp, was told refill is too soon but she is now taking 2 a day, so she is out  Preferred Pharmacy (with phone number or street name):  [id number] [place]: [phone] Fax: [phone]    Has the patient been seen for an appointment in the last year OR does the patient have an upcoming appointment? yes  Agent: Please be advised that RX refills may take up to 3 business days. We ask that you follow-up with your pharmacy.

## 2022-06-25 NOTE — Telephone Encounter (Signed)
Requested medications are due for refill today.  yes  Requested medications are on the active medications list.  yes  Last refill. 10/08/2021 #60 5 rf  Future visit scheduled.   yes  Notes to clinic.  Pharmacy needs Dx code.     Requested Prescriptions  Pending Prescriptions Disp Refills   potassium chloride (KLOR-CON) 10 MEQ tablet 60 tablet 5    Sig: Take 2 tablets (20 mEq total) by mouth daily.     Endocrinology:  Minerals - Potassium Supplementation Failed - 06/25/2022 10:25 AM      Failed - Cr in normal range and within 360 days    Creatinine, Ser  Date Value Ref Range Status  01/04/2022 1.33 (H) 0.57 - 1.00 mg/dL Final   Creatinine,U  Date Value Ref Range Status  07/15/2015 107.0 mg/dL Final   Creatinine, Urine  Date Value Ref Range Status  01/01/2018 52.71 mg/dL Final         Passed - K in normal range and within 360 days    Potassium  Date Value Ref Range Status  01/04/2022 3.9 3.5 - 5.2 mmol/L Final         Passed - Valid encounter within last 12 months    Recent Outpatient Visits           4 months ago Type 2 diabetes mellitus with morbid obesity ([place])   [place] Karle Plumber B, MD   5 months ago Type 2 diabetes mellitus with morbid obesity [facility])   [place] Ladell Pier, MD   5 months ago Erroneous encounter - disregard   [place] Karle Plumber B, MD   12 months ago Type 2 diabetes mellitus with morbid obesity [facility])   [place] Ladell Pier, MD   1 year ago Subacute vaginitis   [place], MD       Future Appointments             In 1 week Ladell Pier, MD [place]

## 2022-07-06 NOTE — Progress Notes (Signed)
[facility] Note  07/13/2022     CHIEF COMPLAINT Patient presents for Retina Follow Up   HISTORY OF PRESENT ILLNESS: Kimberly Frazier is a 62 y.o. female who presents to the clinic today for:   HPI     Retina Follow Up   Patient presents with  Diabetic Retinopathy.  In both eyes.  Severity is moderate.  Duration of 5 weeks.  Since onset it is stable.  I, the attending physician,  performed the HPI with the patient and updated documentation appropriately.        Comments   Pt here for 5 wk ret f/u for NPDR OU. Pt states VA the same, no changes. Having some issues w/ dryness OU. Pt reports using gtts as she should but saying they burn when they go in. Pt is using Refresh.       Last edited by Bernarda Caffey, MD on 07/13/2022 12:12 PM.    Pt states no change in vision. She is needing refills on Ketorolac and Dorz/Timolol., new RX was sent in to pharmacy.  Referring physician: Ladell Pier, MD [place],  [place] [id number]  HISTORICAL INFORMATION:   Selected notes from the MEDICAL RECORD NUMBER Referred by Dr. Rutherford Guys for concern of CME    CURRENT MEDICATIONS: Current Outpatient Medications (Ophthalmic Drugs)  Medication Sig   brimonidine (ALPHAGAN) 0.2 % ophthalmic solution Place 1 drop into both eyes 2 (two) times daily.   prednisoLONE acetate (PRED FORTE) 1 % ophthalmic suspension Place 1 drop into both eyes daily.   dorzolamide-timolol (COSOPT) 2-0.5 % ophthalmic solution Place 1 drop into both eyes 2 (two) times daily.   ketorolac (ACULAR) 0.5 % ophthalmic solution Place 1 drop into both eyes 4 (four) times daily.   No current facility-administered medications for this visit. (Ophthalmic Drugs)   Current Outpatient Medications (Other)  Medication Sig   Accu-Chek Softclix Lancets lancets Use to check blood sugar 3 times daily.   amLODipine (NORVASC) 10 MG tablet TAKE 1 TABLET (10 MG TOTAL) BY MOUTH DAILY.    aspirin EC 81 MG tablet Take 1 tablet (81 mg total) daily by mouth.   atorvastatin (LIPITOR) 80 MG tablet Take 1 tablet (80 mg total) by mouth once daily. (PLEASE SCHEDULE AN APPT FOR FUTURE REFILLS)   Blood Glucose Monitoring Suppl (ACCU-CHEK GUIDE) w/Device KIT 1 kit by Does not apply route in the morning, at noon, and at bedtime. Use to check blood sugar 3 times daily.   carvedilol (COREG) 25 MG tablet TAKE 1 TABLET (25 MG TOTAL) BY MOUTH 2 (TWO) TIMES DAILY WITH A MEAL.   Continuous Blood Gluc Receiver (FREESTYLE LIBRE 2 READER) DEVI Use as directed   Continuous Blood Gluc Sensor (FREESTYLE LIBRE SENSOR SYSTEM) MISC Change sensor every 2 weeks   diclofenac Sodium (VOLTAREN) 1 % GEL Apply 2 g topically 2 (two) times daily as needed.   Dulaglutide (TRULICITY) 1.5 0000000 SOPN Inject 1.5 mg into the skin once a week.   ezetimibe (ZETIA) 10 MG tablet Take 1 tablet (10 mg total) by mouth daily.   glucose blood (ACCU-CHEK GUIDE) test strip Use to check blood sugar 3 times daily.   insulin glargine (LANTUS SOLOSTAR) 100 UNIT/ML Solostar Pen Inject 58 Units into the skin daily.(Must keep upcoming office visit for refills)   insulin lispro (HUMALOG KWIKPEN) 100 UNIT/ML KwikPen Inject 28 Units into the skin in the morning and at bedtime.   Insulin Pen  Needle (BD PEN NEEDLE NANO U/F) 32G X 4 MM MISC Use as directed.   Insulin Syringe-Needle U-100 (BD VEO INSULIN SYRINGE U/F) 31G X 15/64" 0.3 ML MISC USE TO INJECT NOVOLOG THREE TIMES DAILY   isosorbide mononitrate (IMDUR) 60 MG 24 hr tablet TAKE 1 TABLET (60 MG TOTAL) BY MOUTH DAILY.   Na Sulfate-K Sulfate-Mg Sulf 17.5-3.13-1.6 GM/177ML SOLN Suprep (no substitutions)-TAKE AS DIRECTED.   nitroGLYCERIN (NITROSTAT) 0.4 MG SL tablet Place 1 tab under tongue for chest pain.  May repeat after 5 minutes x 2.  DO NOT TAKE MORE THAN 3 TABS DURING AN EPISODE OF CHEST PAIN   potassium chloride (KLOR-CON) 10 MEQ tablet Take 2 tablets (20 mEq total) by mouth daily.    ciprofloxacin (CIPRO) 250 MG tablet Take 1 tablet (250 mg total) by mouth 2 (two) times daily.   No current facility-administered medications for this visit. (Other)   REVIEW OF SYSTEMS: ROS   Positive for: Endocrine, Cardiovascular, Eyes Negative for: Constitutional, Gastrointestinal, Neurological, Skin, Genitourinary, Musculoskeletal, HENT, Respiratory, Psychiatric, Allergic/Imm, Heme/Lymph Last edited by Kingsley Spittle, COT on 07/13/2022  9:12 AM.     ALLERGIES Allergies  Allergen Reactions   Jardiance [Empagliflozin] Other (See Comments)    Frequent yeast infection   PAST MEDICAL HISTORY Past Medical History:  Diagnosis Date   Adrenal adenoma, left    Arthritis    Cancer ([place])    CKD (chronic kidney disease), stage II    Coronary artery disease 07-28-2018 followed [facility])  currently due to no insurance   per cardiac cath 02-06-2015 (positive mild lateral ishcemia on stress test)--- dLAD 80%,  mLAD 40%,  mPDA 80% (small vessel),  ostial D1 70%,  CFx with lumial irregarlities-- medical management   Depression    Diabetic neuropathy ([place])    Diabetic retinopathy ([place])    NPDR OU   Headache    History of Bell's palsy 07/2011   per pt residual facial pain on left side occasionally   History of cancer of vagina 1999   per pt completed radiation and chemo   History of sepsis 12/30/2017   positive blood culter fro E.coli   Hyperlipidemia    Hypertension    Hypertensive retinopathy    OU   Hypokalemia    Insulin dependent type 2 diabetes mellitus, uncontrolled    followed by pcp---  A1c was 11.7 on 06-15-2018 in epic   Myocardial infarction [facility])    10 yrs ago?   Nocturia    Peripheral neuropathy    PMB (postmenopausal bleeding)    Wears dentures    upper   Wears glasses    Past Surgical History:  Procedure Laterality Date   BREAST BIOPSY  2012   benign   CARDIAC CATHETERIZATION N/A 02/06/2015   Procedure: Left Heart Cath and Coronary  Angiography;  Surgeon: Larey Dresser, MD;  Location: [facility];  Service: Cardiovascular;  Laterality: N/A;   CATARACT EXTRACTION Bilateral OD: 03/07/19, OS: 02/28/19   Dr. Gershon Crane   CATARACT EXTRACTION, BILATERAL     COLONOSCOPY     normal exam in [place], [place] 10-12 years ago   EYE SURGERY Bilateral OD: 03/07/19, OS: 02/28/19   Cat Sx - Dr. Gershon Crane   HYSTEROSCOPY WITH D & C N/A 08/01/2018   Procedure: DILATATION AND CURETTAGE /HYSTEROSCOPY;  Surgeon: Mora Bellman, MD;  Location: [facility];  Service: Gynecology;  Laterality: N/A;   ROBOTIC ASSISTED TOTAL HYSTERECTOMY WITH BILATERAL SALPINGO  OOPHERECTOMY N/A 11/28/2018   Procedure: XI ROBOTIC ASSISTED TOTAL HYSTERECTOMY WITH BILATERAL SALPINGO OOPHORECTOMY;  Surgeon: Everitt Amber, MD;  Location: [facility];  Service: Gynecology;  Laterality: N/A;   SENTINEL NODE BIOPSY N/A 11/28/2018   Procedure: SENTINEL NODE BIOPSY;  Surgeon: Everitt Amber, MD;  Location: [facility];  Service: Gynecology;  Laterality: N/A;   UMBILICAL HERNIA REPAIR  child   FAMILY HISTORY Family History  Problem Relation Age of Onset   Heart disease Mother    Hypertension Mother    Diabetes Mother    Cancer Father        hodgkins lymphoma   Heart disease Sister        heart attack   Diabetes Sister    Colon cancer Brother 59   Diabetes Maternal Aunt    Diabetes Maternal Uncle    Cancer Other        parent   Diabetes Other        parent   Heart disease Other        parent   Hyperlipidemia Other        parent   Hypertension Other        parent   Arthritis Other        parent   Breast cancer Neg Hx    Colon polyps Neg Hx    Esophageal cancer Neg Hx    Rectal cancer Neg Hx    Stomach cancer Neg Hx    SOCIAL HISTORY Social History   Tobacco Use   Smoking status: Never   Smokeless tobacco: Never  Vaping Use   Vaping Use: Never used  Substance Use Topics   Alcohol use: Not Currently   Drug use: Not Currently    Types:  Marijuana    Comment: every 2 weeks per pt       OPHTHALMIC EXAM:  Base Eye Exam     Visual Acuity (Snellen - Linear)       Right Left   Dist [place] 20/20 20/200 -2   Dist ph [place]  NI         Tonometry (Tonopen, 9:16 AM)       Right Left   Pressure 20 15         Pupils       Pupils Dark Light Shape React APD   Right PERRL 3 2 Round Brisk None   Left PERRL 3 2 Round Brisk None         Visual Fields (Counting fingers)       Left Right    Full Full         Extraocular Movement       Right Left    Full, Ortho Full, Ortho         Neuro/Psych     Oriented x3: Yes   Mood/Affect: Normal         Dilation     Both eyes: 1.0% Mydriacyl, 2.5% Phenylephrine @ 9:17 AM           Slit Lamp and Fundus Exam     Slit Lamp Exam       Right Left   Lids/Lashes Dermatochalasis - upper lid Dermatochalasis - upper lid   Conjunctiva/Sclera Mild Melanosis Mild Melanosis   Cornea 1+ Punctate epithelial erosions 2+ central punctate epithelial erosions, decreased TBUT, mild tear film debris, well healed temporal cataract wounds   Anterior Chamber Deep, 0.5+cell/pigment Deep, 0.5+cell/pigment   Iris Round and dilated, No NVI Round and dilated,  No NVI   Lens PC IOL in good position with trace PCO PC IOL in good position with 1+PCO   Anterior Vitreous Vitreous syneresis, Posterior vitreous detachment Vitreous syneresis, Posterior vitreous detachment, vitreous condensations         Fundus Exam       Right Left   Disc Pink and Sharp Compact, Pink and Sharp, temporal PPA   C/D Ratio 0.1 0.1   Macula Good foveal reflex, persistent cystic changes / edema inferior macula -- slightly improved, scattered Microaneurysms Blunted foveal reflex, central edema - slightly improved, +exudates/MA/IRH -- greatest temporal macula -- improved, +central atrophy, no heme   Vessels attenuated, Tortuous attenuated, Tortuous   Periphery Attached, scattered drusen, rare, scattered DBH,  reticular degeneration Attached, scattered drusen, rare, scattered DBH, reticular degeneration           IMAGING AND PROCEDURES  Imaging and Procedures for 07/13/2022  OCT, Retina - OU - Both Eyes       Right Eye Quality was good. Central Foveal Thickness: 253. Progression has improved. Findings include no SRF, abnormal foveal contour, intraretinal hyper-reflective material, intraretinal fluid, vitreomacular adhesion (Mild interval improvement in IRF/cystic changes inferior macula and fovea ).   Left Eye Quality was good. Central Foveal Thickness: 273. Progression has improved. Findings include no SRF, abnormal foveal contour, subretinal hyper-reflective material, intraretinal hyper-reflective material, intraretinal fluid, outer retinal atrophy, vitreomacular adhesion (interval improvement in IRF/IRHM inferior and temporal mac and fovea, +central ORA).   Notes *Images captured and stored on drive  Diagnosis / Impression:  Macular edema OU - likely combination of CME + DME OU OD: Mild interval improvement in IRF inferior/cystic changes inferior macula and fovea  OS: interval improvement in IRF/IRHM inferior and temporal mac and fovea, +central ORA  Clinical management:  See below  Abbreviations: NFP - Normal foveal profile. CME - cystoid macular edema. PED - pigment epithelial detachment. IRF - intraretinal fluid. SRF - subretinal fluid. EZ - ellipsoid zone. ERM - epiretinal membrane. ORA - outer retinal atrophy. ORT - outer retinal tubulation. SRHM - subretinal hyper-reflective material      Intravitreal Injection, Pharmacologic Agent - OD - Right Eye       Time Out 07/13/2022. 10:12 AM. Confirmed correct patient, procedure, site, and patient consented.   Anesthesia Topical anesthesia was used. Anesthetic medications included Lidocaine 2%, Proparacaine 0.5%.   Procedure Preparation included 5% betadine to ocular surface, eyelid speculum. A (32g) needle was used.    Injection: 2 mg aflibercept 2 MG/0.05ML   Route: Intravitreal, Site: Right Eye   NDC: O5083423, Lot: [id number], Expiration date: 07/29/2023, Waste: 0 mL   Post-op Post injection exam found visual acuity of at least counting fingers. The patient tolerated the procedure well. There were no complications. The patient received written and verbal post procedure care education.      Intravitreal Injection, Pharmacologic Agent - OS - Left Eye       Time Out 07/13/2022. 10:12 AM. Confirmed correct patient, procedure, site, and patient consented.   Anesthesia Topical anesthesia was used. Anesthetic medications included Lidocaine 2%, Proparacaine 0.5%.   Procedure Preparation included 5% betadine to ocular surface, eyelid speculum. A (32g) needle was used.   Injection: 6 mg faricimab-svoa 6 MG/0.05ML   Route: Intravitreal, Site: Left Eye   NDC: V6823643, Lot: [id number], Expiration date: 06/30/2024, Waste: 0 mL   Post-op Post injection exam found visual acuity of at least counting fingers. The patient tolerated the procedure well. There were no complications. The patient  received written and verbal post procedure care education. Post injection medications were not given.            ASSESSMENT/PLAN:    ICD-10-CM   1. Moderate nonproliferative diabetic retinopathy of both eyes with macular edema associated with type 2 diabetes mellitus (HCC)  E11.3313 OCT, Retina - OU - Both Eyes    Intravitreal Injection, Pharmacologic Agent - OD - Right Eye    Intravitreal Injection, Pharmacologic Agent - OS - Left Eye    faricimab-svoa (VABYSMO) 94m/0.05mL intravitreal injection    aflibercept (EYLEA) SOLN 2 mg    2. CME (cystoid macular edema), bilateral  H35.353     3. Essential hypertension  I10     4. Hypertensive retinopathy of both eyes  H35.033     5. Pseudophakia of both eyes  Z96.1     6. Bilateral ocular hypertension  H40.053     7. Dry eyes, bilateral  H04.123       1,2. Moderate Non-proliferative diabetic retinopathy, OU OD with combination DME/CME - delayed f/u -- 8 wks instead of 5  - last A1c 9.6 on 08.07.23, 10.1 on 1.31.23 - h/o delayed to follow up from September 2021 to April 2022--7 mos instead of 4 wks**             - exam shows scattered IRH, edema and tortuous blood vessels OU - FA (10.23.20) shows leaking MA OU and paracentral petaloid hyperfluorecence OD --> CME/[place] - OCT shows diabetic macular edema OU; OD with CME/[place] component contributing as well - s/p IVA OD #1 (11.06.20), #2 (12.18.20), #3 (02.24.21), #4 (03.24.21), #5 (04.21.21), #6 (5.26.21), #7 (07.26.21), #8 (08.25.21), #9 (09.23.21), #10 (04.26.22) -- IVA resistance - s/p IVA OS #1 (10.23.20), #2 (11.18.20), #3 (12.18.20), #4 (02.24.21), #5 (03.24.21) -- IVA resistance - s/p IVE OD #1 (05.25.22), #2 (07.01.22), #3 (07.29.22), #4 (08.29.22 - JDM), #5 (09.26.22), #6 (10.24.22), #7 (11.21.22), #8 (12.19.22), #9 (01.23.23), #10 (02.20.23), #11 (03.22.23), #12 (04.19.23), #13 (05.17.23), #14 (06.14.23), #15 (08.04.23), #16 (09.01.23), #17 (10.10.23), #18 (11.14.23), #19 (12.19.23) - s/p IVE OS #1 (04.21.21), #2 (5.26.21), #3 (07.26.21), #4 (08.25.21), #5 (09.23.21), #6 (04.26.22), #7 (05.25.22), #8 (07.01.22), #9 (07.29.22), #10 (08.29.22 - JDM), #11 (09.26.22), #12 (10.24.22) -- IVE resistance - s/p IV Vabysmo OS #1 (11.21.22, sample), #2 (12.19.22), #3 (01.23.23), #4 (02.20.23), #5 (03.22.23), #6 (04.19.23), #7 (05.17.23), #8 (06.14.23, sample), #9 (08.04.23), #10 (9.01.23), #11 (10.10.23), #12 (11.14.23), #13 (12.19.23) - BCVA OD 20/20; OS 20/250 -- improved - OCT today: OD: Mild interval improvement in IRF/cystic changes inferior macula and fovea; OS: interval improvement in IRF/IRHM inferior and temporal mac and fovea, +central ORA at 8 weeks  - recommend IVE OD #20 and IV Vabysmo OS #14 today, 02.13.24 with follow up in 5 weeks  - pt wishes to proceed w/ injections              - RBA of procedure discussed, questions answered             - informed consent obtained             - [place] informed consent form signed and scanned on 12.18.2020       - Eylea OU informed consent form re-signed and scanned on 05.17.23             - Vabysmo OS consent form signed and scanned on 11.21.23             - see procedure note             -  Eylea4U benefits investigation started, 04.21.21             - cont Pred Forte qd OU, continue Ketorolac QID OU             - f/u in 5 weeks, DFE, OCT, possible injection OU   3,4. Hypertensive retinopathy OU             - discussed importance of tight BP control             - continue to monitor   5. Pseudophakia OU            - s/p CE/IOL OU Gershon Crane)                        OD: 03/07/2019                        OS: 02/28/2019             - IOLs in good position             - CME OU as above             - continue to monitor   6. Ocular Hypertension OU             - IOP: 20,15 - h/o steroid response             - cont Cosopt BID OU and Brimonidine BID OU  7. Dry eyes OU - recommend artificial tears and lubricating ointment as needed   Ophthalmic Meds Ordered this visit:  Meds ordered this encounter  Medications   ketorolac (ACULAR) 0.5 % ophthalmic solution    Sig: Place 1 drop into both eyes 4 (four) times daily.    Dispense:  10 mL    Refill:  3   dorzolamide-timolol (COSOPT) 2-0.5 % ophthalmic solution    Sig: Place 1 drop into both eyes 2 (two) times daily.    Dispense:  10 mL    Refill:  3   faricimab-svoa (VABYSMO) 27m/0.05mL intravitreal injection   aflibercept (EYLEA) SOLN 2 mg     Return in about 5 weeks (around 08/17/2022) for f/u NPDR OU, DFE, OCT, Possible, IVE, OD, IVV, OS.  There are no Patient Instructions on file for this visit.  Explained the diagnoses, plan, and follow up with the patient and they expressed understanding.  Patient expressed understanding of the importance of proper follow up  care.    This document serves as a record of services personally performed by BGardiner Sleeper MD, PhD. It was created on their behalf by CRenaldo Reel CFort Hoodan ophthalmic technician. The creation of this record is the provider's dictation and/or activities during the visit.    Electronically signed by:  CRenaldo Reel COT  002.13.24 12:12 PM.  BGardiner Sleeper M.D., Ph.D. Diseases & Surgery of the Retina and Vitreous [facility] I have reviewed the above documentation for accuracy and completeness, and I agree with the above. BGardiner Sleeper M.D., Ph.D. 07/13/22 12:15 PM   Abbreviations: M myopia (nearsighted); A astigmatism; H hyperopia (farsighted); P presbyopia; Mrx spectacle prescription;  CTL contact lenses; OD right eye; OS left eye; OU both eyes  XT exotropia; ET esotropia; PEK punctate epithelial keratitis; PEE punctate epithelial erosions; DES dry eye syndrome; MGD meibomian gland dysfunction; ATs artificial tears; PFAT's preservative free artificial tears; NMidlothiannuclear sclerotic cataract; PSC posterior subcapsular  cataract; ERM epi-retinal membrane; PVD posterior vitreous detachment; RD retinal detachment; DM diabetes mellitus; DR diabetic retinopathy; NPDR non-proliferative diabetic retinopathy; PDR proliferative diabetic retinopathy; CSME clinically significant macular edema; DME diabetic macular edema; dbh dot blot hemorrhages; CWS cotton wool spot; POAG primary open angle glaucoma; C/D cup-to-disc ratio; HVF humphrey visual field; GVF goldmann visual field; OCT optical coherence tomography; IOP intraocular pressure; BRVO Branch retinal vein occlusion; CRVO central retinal vein occlusion; CRAO central retinal artery occlusion; BRAO branch retinal artery occlusion; RT retinal tear; SB scleral buckle; PPV pars plana vitrectomy; VH Vitreous hemorrhage; PRP panretinal laser photocoagulation; IVK intravitreal kenalog; VMT vitreomacular traction; MH Macular  hole;  NVD neovascularization of the disc; NVE neovascularization elsewhere; AREDS age related eye disease study; ARMD age related macular degeneration; POAG primary open angle glaucoma; EBMD epithelial/anterior basement membrane dystrophy; ACIOL anterior chamber intraocular lens; IOL intraocular lens; PCIOL posterior chamber intraocular lens; Phaco/IOL phacoemulsification with intraocular lens placement; [place] photorefractive keratectomy; LASIK laser assisted in situ keratomileusis; HTN hypertension; DM diabetes mellitus; COPD chronic obstructive pulmonary disease

## 2022-07-08 ENCOUNTER — Ambulatory Visit: Payer: 59 | Admitting: Internal Medicine

## 2022-07-12 ENCOUNTER — Other Ambulatory Visit: Payer: Self-pay

## 2022-07-13 ENCOUNTER — Other Ambulatory Visit: Payer: Self-pay

## 2022-07-13 ENCOUNTER — Ambulatory Visit (INDEPENDENT_AMBULATORY_CARE_PROVIDER_SITE_OTHER): Payer: 59 | Admitting: Ophthalmology

## 2022-07-13 ENCOUNTER — Encounter (INDEPENDENT_AMBULATORY_CARE_PROVIDER_SITE_OTHER): Payer: Self-pay | Admitting: Ophthalmology

## 2022-07-13 DIAGNOSIS — I10 Essential (primary) hypertension: Secondary | ICD-10-CM | POA: Diagnosis not present

## 2022-07-13 DIAGNOSIS — H35353 Cystoid macular degeneration, bilateral: Secondary | ICD-10-CM | POA: Diagnosis not present

## 2022-07-13 DIAGNOSIS — E113313 Type 2 diabetes mellitus with moderate nonproliferative diabetic retinopathy with macular edema, bilateral: Secondary | ICD-10-CM

## 2022-07-13 DIAGNOSIS — H40053 Ocular hypertension, bilateral: Secondary | ICD-10-CM

## 2022-07-13 DIAGNOSIS — H35033 Hypertensive retinopathy, bilateral: Secondary | ICD-10-CM

## 2022-07-13 DIAGNOSIS — Z961 Presence of intraocular lens: Secondary | ICD-10-CM | POA: Diagnosis not present

## 2022-07-13 DIAGNOSIS — H04123 Dry eye syndrome of bilateral lacrimal glands: Secondary | ICD-10-CM

## 2022-07-13 MED ORDER — FARICIMAB-SVOA 6 MG/0.05ML IZ SOLN
6.0000 mg | INTRAVITREAL | Status: AC | PRN
  Administered 2022-07-13: 6 mg via INTRAVITREAL

## 2022-07-13 MED ORDER — DORZOLAMIDE HCL-TIMOLOL MAL 2-0.5 % OP SOLN
1.0000 [drp] | Freq: Two times a day (BID) | OPHTHALMIC | 3 refills | Status: DC
  Filled 2022-07-13: qty 10, 50d supply, fill #0
  Filled 2022-09-23: qty 10, 50d supply, fill #1
  Filled 2022-11-26: qty 10, 50d supply, fill #2
  Filled 2023-02-02: qty 10, 50d supply, fill #3

## 2022-07-13 MED ORDER — AFLIBERCEPT 2MG/0.05ML IZ SOLN FOR KALEIDOSCOPE
2.0000 mg | INTRAVITREAL | Status: AC | PRN
  Administered 2022-07-13: 2 mg via INTRAVITREAL

## 2022-07-13 MED ORDER — KETOROLAC TROMETHAMINE 0.5 % OP SOLN
1.0000 [drp] | Freq: Four times a day (QID) | OPHTHALMIC | 3 refills | Status: DC
  Filled 2022-07-13: qty 10, 25d supply, fill #0
  Filled 2022-09-23: qty 10, 25d supply, fill #1
  Filled 2022-12-21: qty 10, 25d supply, fill #2
  Filled 2023-02-02: qty 10, 25d supply, fill #3

## 2022-07-14 ENCOUNTER — Other Ambulatory Visit: Payer: Self-pay | Admitting: Cardiology

## 2022-07-14 ENCOUNTER — Other Ambulatory Visit: Payer: Self-pay

## 2022-07-14 MED ORDER — ISOSORBIDE MONONITRATE ER 60 MG PO TB24
ORAL_TABLET | Freq: Every day | ORAL | 1 refills | Status: DC
  Filled 2022-07-14: qty 90, 90d supply, fill #0
  Filled 2022-09-23: qty 90, 90d supply, fill #1

## 2022-07-14 NOTE — Telephone Encounter (Signed)
Rx(s) sent to pharmacy electronically.  

## 2022-07-16 ENCOUNTER — Other Ambulatory Visit: Payer: Self-pay

## 2022-07-22 ENCOUNTER — Other Ambulatory Visit: Payer: Self-pay

## 2022-07-23 ENCOUNTER — Other Ambulatory Visit: Payer: Self-pay

## 2022-07-23 ENCOUNTER — Other Ambulatory Visit: Payer: Self-pay | Admitting: Internal Medicine

## 2022-07-23 DIAGNOSIS — E1165 Type 2 diabetes mellitus with hyperglycemia: Secondary | ICD-10-CM

## 2022-07-23 MED ORDER — LANTUS SOLOSTAR 100 UNIT/ML ~~LOC~~ SOPN
58.0000 [IU] | PEN_INJECTOR | Freq: Every day | SUBCUTANEOUS | 0 refills | Status: DC
  Filled 2022-07-23: qty 15, 25d supply, fill #0

## 2022-07-23 NOTE — Telephone Encounter (Signed)
Requested medication (s) are due for refill today: yes  Requested medication (s) are on the active medication list: yes  Last refill:  06/18/22 15 ml   Future visit scheduled: yes  Notes to clinic:  overdue HgbA1c   Requested Prescriptions  Pending Prescriptions Disp Refills   insulin glargine (LANTUS SOLOSTAR) 100 UNIT/ML Solostar Pen 15 mL 0    Sig: Inject 58 Units into the skin daily.(Must keep upcoming office visit for refills)     Endocrinology:  Diabetes - Insulins Failed - 07/23/2022  8:31 AM      Failed - HBA1C is between 0 and 7.9 and within 180 days    HbA1c, POC (controlled diabetic range)  Date Value Ref Range Status  06/30/2021 10.1 (A) 0.0 - 7.0 % Final   Hgb A1c MFr Bld  Date Value Ref Range Status  01/04/2022 9.6 (H) 4.8 - 5.6 % Final    Comment:             Prediabetes: 5.7 - 6.4          Diabetes: >6.4          Glycemic control for adults with diabetes: <7.0          Passed - Valid encounter within last 6 months    Recent Outpatient Visits           5 months ago Type 2 diabetes mellitus with morbid obesity ([place])   [place] Karle Plumber B, MD   6 months ago Type 2 diabetes mellitus with morbid obesity [facility])   [place], MD   6 months ago Erroneous encounter - disregard   [place] Karle Plumber B, MD   1 year ago Type 2 diabetes mellitus with morbid obesity [facility])   [place] Ladell Pier, MD   1 year ago Subacute vaginitis   [place], MD       Future Appointments             In 2 weeks Ladell Pier, MD [place]

## 2022-07-26 ENCOUNTER — Other Ambulatory Visit: Payer: Self-pay

## 2022-07-27 ENCOUNTER — Other Ambulatory Visit: Payer: Self-pay

## 2022-08-02 ENCOUNTER — Other Ambulatory Visit: Payer: Self-pay

## 2022-08-03 ENCOUNTER — Other Ambulatory Visit: Payer: Self-pay

## 2022-08-04 NOTE — Progress Notes (Signed)
[facility] Note  08/18/2022     CHIEF COMPLAINT Patient presents for Retina Follow Up   HISTORY OF PRESENT ILLNESS: Kimberly Frazier is a 62 y.o. female who presents to the clinic today for:   HPI     Retina Follow Up   Patient presents with  Diabetic Retinopathy.  In both eyes.  This started 5 weeks ago.  Duration of 5 weeks.  Since onset it is stable.  I, the attending physician,  performed the HPI with the patient and updated documentation appropriately.        Comments   5 week retina follow up I'VE OD IVV OS pt is reporting no vision changes noticed she does have flashes and floaters at times       Last edited by Bernarda Caffey, MD on 08/18/2022 12:35 PM.    Pt states vision is stable, pt is now taking Ozempic  Referring physician: Ladell Pier, MD [place],  [place] [address]  HISTORICAL INFORMATION:   Selected notes from the MEDICAL RECORD NUMBER Referred by Dr. Rutherford Guys for concern of CME    CURRENT MEDICATIONS: Current Outpatient Medications (Ophthalmic Drugs)  Medication Sig   brimonidine (ALPHAGAN) 0.2 % ophthalmic solution Place 1 drop into both eyes 2 (two) times daily.   dorzolamide-timolol (COSOPT) 2-0.5 % ophthalmic solution Place 1 drop into both eyes 2 (two) times daily.   ketorolac (ACULAR) 0.5 % ophthalmic solution Place 1 drop into both eyes 4 (four) times daily.   prednisoLONE acetate (PRED FORTE) 1 % ophthalmic suspension Place 1 drop into both eyes daily.   No current facility-administered medications for this visit. (Ophthalmic Drugs)   Current Outpatient Medications (Other)  Medication Sig   Accu-Chek Softclix Lancets lancets Use to check blood sugar 3 times daily.   amLODipine (NORVASC) 10 MG tablet TAKE 1 TABLET (10 MG TOTAL) BY MOUTH DAILY.   aspirin EC 81 MG tablet Take 1 tablet (81 mg total) daily by mouth.   atorvastatin (LIPITOR) 80 MG tablet Take 1 tablet (80 mg total) by  mouth once daily. (PLEASE SCHEDULE AN APPT FOR FUTURE REFILLS)   Blood Glucose Monitoring Suppl (ACCU-CHEK GUIDE) w/Device KIT 1 kit by Does not apply route in the morning, at noon, and at bedtime. Use to check blood sugar 3 times daily.   carvedilol (COREG) 25 MG tablet TAKE 1 TABLET (25 MG TOTAL) BY MOUTH 2 (TWO) TIMES DAILY WITH A MEAL.   Continuous Blood Gluc Receiver (FREESTYLE LIBRE 2 READER) DEVI Use as directed   Continuous Blood Gluc Sensor (FREESTYLE LIBRE SENSOR SYSTEM) MISC Change sensor every 2 weeks   diclofenac Sodium (VOLTAREN) 1 % GEL Apply 2 g topically 2 (two) times daily as needed.   ezetimibe (ZETIA) 10 MG tablet Take 1 tablet (10 mg total) by mouth daily.   glucose blood (ACCU-CHEK GUIDE) test strip Use to check blood sugar 3 times daily.   insulin glargine (LANTUS SOLOSTAR) 100 UNIT/ML Solostar Pen Inject 58 Units into the skin daily.(Must keep upcoming office visit for refills)   insulin lispro (HUMALOG KWIKPEN) 100 UNIT/ML KwikPen Inject 28 Units into the skin in the morning and at bedtime.   Insulin Pen Needle (BD PEN NEEDLE NANO U/F) 32G X 4 MM MISC Use as directed.   Insulin Syringe-Needle U-100 (BD VEO INSULIN SYRINGE U/F) 31G X 15/64" 0.3 ML MISC USE TO INJECT NOVOLOG THREE TIMES DAILY   isosorbide mononitrate (IMDUR) 60  MG 24 hr tablet TAKE 1 TABLET (60 MG TOTAL) BY MOUTH DAILY.   Na Sulfate-K Sulfate-Mg Sulf 17.5-3.13-1.6 GM/177ML SOLN Suprep (no substitutions)-TAKE AS DIRECTED.   nitroGLYCERIN (NITROSTAT) 0.4 MG SL tablet Place 1 tab under tongue for chest pain.  May repeat after 5 minutes x 2.  DO NOT TAKE MORE THAN 3 TABS DURING AN EPISODE OF CHEST PAIN   potassium chloride (KLOR-CON) 10 MEQ tablet Take 2 tablets (20 mEq total) by mouth daily.   Semaglutide,0.25 or 0.5MG /DOS, 2 MG/3ML SOPN Inject 0.5 mg into the skin once a week.   No current facility-administered medications for this visit. (Other)   REVIEW OF SYSTEMS: ROS   Positive for: Endocrine,  Cardiovascular, Eyes Negative for: Constitutional, Gastrointestinal, Neurological, Skin, Genitourinary, Musculoskeletal, HENT, Respiratory, Psychiatric, Allergic/Imm, Heme/Lymph Last edited by Parthenia Ames, COT on 08/18/2022  9:03 AM.     ALLERGIES Allergies  Allergen Reactions   Jardiance [Empagliflozin] Other (See Comments)    Frequent yeast infection   PAST MEDICAL HISTORY Past Medical History:  Diagnosis Date   Adrenal adenoma, left    Arthritis    Cancer ([place])    CKD (chronic kidney disease), stage II    Coronary artery disease 07-28-2018 followed [facility])  currently due to no insurance   per cardiac cath 02-06-2015 (positive mild lateral ishcemia on stress test)--- dLAD 80%,  mLAD 40%,  mPDA 80% (small vessel),  ostial D1 70%,  CFx with lumial irregarlities-- medical management   Depression    Diabetic neuropathy ([place])    Diabetic retinopathy ([place])    NPDR OU   Headache    History of Bell's palsy 07/2011   per pt residual facial pain on left side occasionally   History of cancer of vagina 1999   per pt completed radiation and chemo   History of sepsis 12/30/2017   positive blood culter fro E.coli   Hyperlipidemia    Hypertension    Hypertensive retinopathy    OU   Hypokalemia    Insulin dependent type 2 diabetes mellitus, uncontrolled    followed by pcp---  A1c was 11.7 on 06-15-2018 in epic   Myocardial infarction [facility])    10 yrs ago?   Nocturia    Peripheral neuropathy    PMB (postmenopausal bleeding)    Wears dentures    upper   Wears glasses    Past Surgical History:  Procedure Laterality Date   BREAST BIOPSY  2012   benign   CARDIAC CATHETERIZATION N/A 02/06/2015   Procedure: Left Heart Cath and Coronary Angiography;  Surgeon: Larey Dresser, MD;  Location: [facility];  Service: Cardiovascular;  Laterality: N/A;   CATARACT EXTRACTION Bilateral OD: 03/07/19, OS: 02/28/19   Dr. Gershon Crane   CATARACT EXTRACTION,  BILATERAL     COLONOSCOPY     normal exam in [place] 10-12 years ago   EYE SURGERY Bilateral OD: 03/07/19, OS: 02/28/19   Cat Sx - Dr. Gershon Crane   HYSTEROSCOPY WITH D & C N/A 08/01/2018   Procedure: DILATATION AND CURETTAGE /HYSTEROSCOPY;  Surgeon: Mora Bellman, MD;  Location: [facility];  Service: Gynecology;  Laterality: N/A;   ROBOTIC ASSISTED TOTAL HYSTERECTOMY WITH BILATERAL SALPINGO OOPHERECTOMY N/A 11/28/2018   Procedure: XI ROBOTIC ASSISTED TOTAL HYSTERECTOMY WITH BILATERAL SALPINGO OOPHORECTOMY;  Surgeon: Everitt Amber, MD;  Location: [facility];  Service: Gynecology;  Laterality: N/A;   SENTINEL NODE BIOPSY N/A 11/28/2018   Procedure: SENTINEL NODE BIOPSY;  Surgeon:  Everitt Amber, MD;  Location: [facility];  Service: Gynecology;  Laterality: N/A;   UMBILICAL HERNIA REPAIR  child   FAMILY HISTORY Family History  Problem Relation Age of Onset   Heart disease Mother    Hypertension Mother    Diabetes Mother    Cancer Father        hodgkins lymphoma   Heart disease Sister        heart attack   Diabetes Sister    Colon cancer Brother 46   Diabetes Maternal Aunt    Diabetes Maternal Uncle    Cancer Other        parent   Diabetes Other        parent   Heart disease Other        parent   Hyperlipidemia Other        parent   Hypertension Other        parent   Arthritis Other        parent   Breast cancer Neg Hx    Colon polyps Neg Hx    Esophageal cancer Neg Hx    Rectal cancer Neg Hx    Stomach cancer Neg Hx    SOCIAL HISTORY Social History   Tobacco Use   Smoking status: Never   Smokeless tobacco: Never  Vaping Use   Vaping Use: Never used  Substance Use Topics   Alcohol use: Not Currently   Drug use: Not Currently    Types: Marijuana    Comment: every 2 weeks per pt       OPHTHALMIC EXAM:  Base Eye Exam     Visual Acuity (Snellen - Linear)       Right Left   Dist [place] 20/20 20/150 -2   Dist ph [place]  NI         Tonometry  (Tonopen, 9:06 AM)       Right Left   Pressure 14 13         Pupils       Pupils Dark Light Shape React APD   Right PERRL 3 2 Round Brisk None   Left PERRL 3 2 Round Brisk None         Visual Fields       Left Right    Full Full         Extraocular Movement       Right Left    Full, Ortho Full, Ortho         Neuro/Psych     Oriented x3: Yes   Mood/Affect: Normal         Dilation     Both eyes: 2.5% Phenylephrine @ 9:06 AM           Slit Lamp and Fundus Exam     Slit Lamp Exam       Right Left   Lids/Lashes Dermatochalasis - upper lid Dermatochalasis - upper lid   Conjunctiva/Sclera Mild Melanosis Mild Melanosis   Cornea 1+ Punctate epithelial erosions 2+ central punctate epithelial erosions, decreased TBUT, mild tear film debris, well healed temporal cataract wounds   Anterior Chamber Deep, 0.5+cell/pigment Deep, 0.5+cell/pigment   Iris Round and dilated, No NVI Round and dilated, No NVI   Lens PC IOL in good position with trace PCO PC IOL in good position with 1+PCO   Anterior Vitreous Vitreous syneresis, Posterior vitreous detachment Vitreous syneresis, Posterior vitreous detachment, vitreous condensations         Fundus Exam  Right Left   Disc Pink and Sharp Compact, Pink and Sharp, temporal PPA   C/D Ratio 0.1 0.1   Macula Good foveal reflex, persistent cystic changes / edema inferior macula -- slightly improved, scattered Microaneurysms Blunted foveal reflex, central edema - slightly improved, +exudates/MA/IRH -- greatest temporal macula -- improved, +central atrophy, no heme   Vessels attenuated, Tortuous attenuated, Tortuous   Periphery Attached, scattered drusen, rare, scattered DBH, reticular degeneration Attached, scattered drusen, rare, scattered DBH, reticular degeneration           IMAGING AND PROCEDURES  Imaging and Procedures for 08/18/2022  OCT, Retina - OU - Both Eyes       Right Eye Quality was good. Central  Foveal Thickness: 253. Progression has been stable. Findings include no SRF, abnormal foveal contour, intraretinal hyper-reflective material, intraretinal fluid, vitreomacular adhesion (Persistent IRF/cystic changes inferior macula and fovea -- slightly improved).   Left Eye Quality was good. Central Foveal Thickness: 258. Progression has improved. Findings include no SRF, abnormal foveal contour, retinal drusen , subretinal hyper-reflective material, intraretinal hyper-reflective material, intraretinal fluid, outer retinal atrophy, vitreomacular adhesion (Mild interval improvement in IRF/IRHM inferior and temporal mac and fovea, +central ORA).   Notes *Images captured and stored on drive  Diagnosis / Impression:  Macular edema OU - likely combination of CME + DME OU OD: Persistent IRF/cystic changes inferior macula and fovea -- slightly improved OS: interval improvement in IRF/IRHM inferior and temporal mac and fovea, +central ORA  Clinical management:  See below  Abbreviations: NFP - Normal foveal profile. CME - cystoid macular edema. PED - pigment epithelial detachment. IRF - intraretinal fluid. SRF - subretinal fluid. EZ - ellipsoid zone. ERM - epiretinal membrane. ORA - outer retinal atrophy. ORT - outer retinal tubulation. SRHM - subretinal hyper-reflective material      Intravitreal Injection, Pharmacologic Agent - OD - Right Eye       Time Out 08/18/2022. 10:25 AM. Confirmed correct patient, procedure, site, and patient consented.   Anesthesia Topical anesthesia was used. Anesthetic medications included Lidocaine 2%, Proparacaine 0.5%.   Procedure Preparation included 5% betadine to ocular surface, eyelid speculum. A (32g) needle was used.   Injection: 2 mg aflibercept 2 MG/0.05ML   Route: Intravitreal, Site: Right Eye   NDC: A3590391, Lot: [id number], Expiration date: 09/28/2023, Waste: 0 mL   Post-op Post injection exam found visual acuity of at least counting  fingers. The patient tolerated the procedure well. There were no complications. The patient received written and verbal post procedure care education.      Intravitreal Injection, Pharmacologic Agent - OS - Left Eye       Time Out 08/18/2022. 10:27 AM. Confirmed correct patient, procedure, site, and patient consented.   Anesthesia Topical anesthesia was used. Anesthetic medications included Lidocaine 2%, Proparacaine 0.5%.   Procedure Preparation included 5% betadine to ocular surface, eyelid speculum. A (32g) needle was used.   Injection: 6 mg faricimab-svoa 6 MG/0.05ML   Route: Intravitreal, Site: Left Eye   NDC: S6832610, Lot: [id number], Expiration date: 06/29/2024, Waste: 0 mL   Post-op Post injection exam found visual acuity of at least counting fingers. The patient tolerated the procedure well. There were no complications. The patient received written and verbal post procedure care education. Post injection medications were not given.            ASSESSMENT/PLAN:    ICD-10-CM   1. Moderate nonproliferative diabetic retinopathy of both eyes with macular edema associated with type 2 diabetes mellitus ([place])  N82.9562 OCT, Retina - OU - Both Eyes    Intravitreal Injection, Pharmacologic Agent - OD - Right Eye    Intravitreal Injection, Pharmacologic Agent - OS - Left Eye    faricimab-svoa (VABYSMO) 6mg /0.17mL intravitreal injection    aflibercept (EYLEA) SOLN 2 mg    2. CME (cystoid macular edema), bilateral  H35.353     3. Essential hypertension  I10     4. Hypertensive retinopathy of both eyes  H35.033     5. Pseudophakia of both eyes  Z96.1     6. Bilateral ocular hypertension  H40.053     7. Dry eyes, bilateral  H04.123       1,2. Moderate Non-proliferative diabetic retinopathy, OU OD with combination DME/CME  - last A1c 8.2 on 03.08.24; 9.6 on 08.07.23, 10.1 on 1.31.23  - pt now on Ozempic as of Mar 2024 - h/o delayed to follow up from September 2021 to  April 2022--7 mos instead of 4 wks**             - exam shows scattered [place], edema and tortuous blood vessels OU - FA (10.23.20) shows leaking MA OU and paracentral petaloid hyperfluorecence OD --> CME/[place] - OCT shows diabetic macular edema OU; OD with CME/[place] component contributing as well - s/p IVA OD #1 (11.06.20), #2 (12.18.20), #3 (02.24.21), #4 (03.24.21), #5 (04.21.21), #6 (5.26.21), #7 (07.26.21), #8 (08.25.21), #9 (09.23.21), #10 (04.26.22) -- IVA resistance - s/p IVA OS #1 (10.23.20), #2 (11.18.20), #3 (12.18.20), #4 (02.24.21), #5 (03.24.21) -- IVA resistance - s/p IVE OD #1 (05.25.22), #2 (07.01.22), #3 (07.29.22), #4 (08.29.22 - JDM), #5 (09.26.22), #6 (10.24.22), #7 (11.21.22), #8 (12.19.22), #9 (01.23.23), #10 (02.20.23), #11 (03.22.23), #12 (04.19.23), #13 (05.17.23), #14 (06.14.23), #15 (08.04.23), #16 (09.01.23), #17 (10.10.23), #18 (11.14.23), #19 (12.19.23), #20 (02.13.24) - s/p IVE OS #1 (04.21.21), #2 (5.26.21), #3 (07.26.21), #4 (08.25.21), #5 (09.23.21), #6 (04.26.22), #7 (05.25.22), #8 (07.01.22), #9 (07.29.22), #10 (08.29.22 - JDM), #11 (09.26.22), #12 (10.24.22) -- IVE resistance - s/p IV Vabysmo OS #1 (11.21.22, sample), #2 (12.19.22), #3 (01.23.23), #4 (02.20.23), #5 (03.22.23), #6 (04.19.23), #7 (05.17.23), #8 (06.14.23, sample), #9 (08.04.23), #10 (9.01.23), #11 (10.10.23), #12 (11.14.23), #13 (12.19.23), #14 (02.13.24) - BCVA OD 20/20; OS 20/150 -- improved - OCT today: OD: Persistent IRF/cystic changes inferior macula and fovea -- slightly improved; OS: interval improvement in IRF/IRHM inferior and temporal mac and fovea, +central ORA at 5 weeks - recommend IVE OD #21 and IVV OS #15 today, 03.20.24 with follow up in 5 weeks  - pt wishes to proceed w/ injections             - RBA of procedure discussed, questions answered             - informed consent obtained             - [place] informed consent form signed and scanned on 12.18.2020       - Eylea OU  informed consent form re-signed and scanned on 05.17.23             - Vabysmo OS consent form signed and scanned on 11.21.23             - see procedure note             - Eylea4U benefits investigation started, 04.21.21             - cont Pred Forte qd OU, continue Ketorolac QID OU             -  f/u in 5 weeks, DFE, OCT, possible injection OU   3,4. Hypertensive retinopathy OU             - discussed importance of tight BP control             - continue to monitor   5. Pseudophakia OU            - s/p CE/IOL OU Gershon Crane)                        OD: 03/07/2019                        OS: 02/28/2019             - IOLs in good position             - CME OU as above             - continue to monitor   6. Ocular Hypertension OU             - IOP: 14,13 - h/o steroid response             - cont Cosopt BID OU and Brimonidine BID OU  7. Dry eyes OU - recommend artificial tears and lubricating ointment as needed   Ophthalmic Meds Ordered this visit:  Meds ordered this encounter  Medications   faricimab-svoa (VABYSMO) 6mg /0.31mL intravitreal injection   aflibercept (EYLEA) SOLN 2 mg     Return in about 5 weeks (around 09/22/2022) for NPDR OU, DFE, OCT, Possible Injxn.  There are no Patient Instructions on file for this visit.  Explained the diagnoses, plan, and follow up with the patient and they expressed understanding.  Patient expressed understanding of the importance of proper follow up care.    This document serves as a record of services personally performed by Gardiner Sleeper, MD, PhD. It was created on their behalf by Orvan Falconer, an ophthalmic technician. The creation of this record is the provider's dictation and/or activities during the visit.    Electronically signed by: Orvan Falconer, OA, 08/18/22  12:37 PM  This document serves as a record of services personally performed by Gardiner Sleeper, MD, PhD. It was created on their behalf by San Jetty. Owens Shark, OA an  ophthalmic technician. The creation of this record is the provider's dictation and/or activities during the visit.    Electronically signed by: San Jetty. Marguerita Merles 03.20.2024 12:37 PM  Gardiner Sleeper, M.D., Ph.D. Diseases & Surgery of the Retina and Vitreous [facility]  I have reviewed the above documentation for accuracy and completeness, and I agree with the above. Gardiner Sleeper, M.D., Ph.D. 08/18/22 12:39 PM   Abbreviations: M myopia (nearsighted); A astigmatism; H hyperopia (farsighted); P presbyopia; Mrx spectacle prescription;  CTL contact lenses; OD right eye; OS left eye; OU both eyes  XT exotropia; ET esotropia; PEK punctate epithelial keratitis; PEE punctate epithelial erosions; DES dry eye syndrome; MGD meibomian gland dysfunction; ATs artificial tears; PFAT's preservative free artificial tears; [place] nuclear sclerotic cataract; PSC posterior subcapsular cataract; ERM epi-retinal membrane; PVD posterior vitreous detachment; RD retinal detachment; DM diabetes mellitus; DR diabetic retinopathy; NPDR non-proliferative diabetic retinopathy; PDR proliferative diabetic retinopathy; CSME clinically significant macular edema; DME diabetic macular edema; dbh dot blot hemorrhages; CWS cotton wool spot; POAG primary open angle glaucoma; C/D cup-to-disc ratio; HVF humphrey visual field; GVF goldmann visual field;  OCT optical coherence tomography; IOP intraocular pressure; BRVO Branch retinal vein occlusion; CRVO central retinal vein occlusion; CRAO central retinal artery occlusion; BRAO branch retinal artery occlusion; RT retinal tear; SB scleral buckle; PPV pars plana vitrectomy; VH Vitreous hemorrhage; PRP panretinal laser photocoagulation; IVK intravitreal kenalog; VMT vitreomacular traction; MH Macular hole;  NVD neovascularization of the disc; NVE neovascularization elsewhere; AREDS age related eye disease study; ARMD age related macular degeneration; POAG primary open angle  glaucoma; EBMD epithelial/anterior basement membrane dystrophy; ACIOL anterior chamber intraocular lens; IOL intraocular lens; PCIOL posterior chamber intraocular lens; Phaco/IOL phacoemulsification with intraocular lens placement; [place] photorefractive keratectomy; LASIK laser assisted in situ keratomileusis; HTN hypertension; DM diabetes mellitus; COPD chronic obstructive pulmonary disease

## 2022-08-06 ENCOUNTER — Ambulatory Visit: Payer: 59 | Attending: Internal Medicine | Admitting: Internal Medicine

## 2022-08-06 ENCOUNTER — Other Ambulatory Visit: Payer: Self-pay

## 2022-08-06 ENCOUNTER — Encounter: Payer: Self-pay | Admitting: Internal Medicine

## 2022-08-06 DIAGNOSIS — R350 Frequency of micturition: Secondary | ICD-10-CM

## 2022-08-06 DIAGNOSIS — I25118 Atherosclerotic heart disease of native coronary artery with other forms of angina pectoris: Secondary | ICD-10-CM | POA: Diagnosis not present

## 2022-08-06 DIAGNOSIS — I152 Hypertension secondary to endocrine disorders: Secondary | ICD-10-CM

## 2022-08-06 DIAGNOSIS — E1169 Type 2 diabetes mellitus with other specified complication: Secondary | ICD-10-CM | POA: Diagnosis not present

## 2022-08-06 DIAGNOSIS — N1832 Chronic kidney disease, stage 3b: Secondary | ICD-10-CM

## 2022-08-06 DIAGNOSIS — E1159 Type 2 diabetes mellitus with other circulatory complications: Secondary | ICD-10-CM

## 2022-08-06 DIAGNOSIS — F320 Major depressive disorder, single episode, mild: Secondary | ICD-10-CM

## 2022-08-06 LAB — POCT GLYCOSYLATED HEMOGLOBIN (HGB A1C): HbA1c, POC (controlled diabetic range): 8.2 % — AB (ref 0.0–7.0)

## 2022-08-06 LAB — GLUCOSE, POCT (MANUAL RESULT ENTRY): POC Glucose: 164 mg/dl — AB (ref 70–99)

## 2022-08-06 NOTE — Progress Notes (Signed)
Patient ID: Kimberly Frazier, female    DOB: August 08, 1960  MRN: [id number]  CC: Diabetes (Dm f/u. Med refills - trulicity/No to flu vax. No to shingles vax. )   Subjective: Kimberly Frazier is a 62 y.o. female who presents for chronic ds management Her concerns today include:  Patient with history of DM with neuropathy and retinopathy, HTN with hypertensive heart disease, CAD (Coronary CT revealed occlusion in the mid to distal LAD, distal RCA.  Cardiology states that she will need CABG eventually), HL, obesity, endometrial CA s/p hysterectomy   DM/Obesity: Results for orders placed or performed in visit on 08/06/22  Microscopic Examination  Result Value Ref Range   WBC, UA >30 (A) 0 - 5 /hpf   RBC, Urine None seen 0 - 2 /hpf   Epithelial Cells (non renal) 0-10 0 - 10 /hpf   Casts None seen None seen /lpf   Bacteria, UA Many (A) None seen/Few  CBC  Result Value Ref Range   WBC 13.3 (H) 3.4 - 10.8 x10E3/uL   RBC 4.86 3.77 - 5.28 x10E6/uL   Hemoglobin 13.4 11.1 - 15.9 g/dL   Hematocrit 29.5 62.1 - 46.6 %   MCV 87 79 - 97 fL   MCH 27.6 26.6 - 33.0 pg   MCHC 31.8 31.5 - 35.7 g/dL   RDW 30.8 65.7 - 84.6 %   Platelets 257 150 - 450 x10E3/uL  Comprehensive metabolic panel  Result Value Ref Range   Glucose 128 (H) 70 - 99 mg/dL   BUN 14 8 - 27 mg/dL   Creatinine, Ser 9.62 (H) 0.57 - 1.00 mg/dL   eGFR 55 (L) >95 MW/UXL/2.44   BUN/Creatinine Ratio 13 12 - 28   Sodium 144 134 - 144 mmol/L   Potassium 3.8 3.5 - 5.2 mmol/L   Chloride 104 96 - 106 mmol/L   CO2 27 20 - 29 mmol/L   Calcium 9.2 8.7 - 10.3 mg/dL   Total Protein 7.3 6.0 - 8.5 g/dL   Albumin 4.0 3.9 - 4.9 g/dL   Globulin, Total 3.3 1.5 - 4.5 g/dL   Albumin/Globulin Ratio 1.2 1.2 - 2.2   Bilirubin Total 0.7 0.0 - 1.2 mg/dL   Alkaline Phosphatase 140 (H) 44 - 121 IU/L   AST 17 0 - 40 IU/L   ALT 18 0 - 32 IU/L  Lipid panel  Result Value Ref Range   Cholesterol, Total 126 100 - 199 mg/dL   Triglycerides 010 0 - 149 mg/dL    HDL 44 >27 mg/dL   VLDL Cholesterol Cal 20 5 - 40 mg/dL   LDL Chol Calc (NIH) 62 0 - 99 mg/dL   Chol/HDL Ratio 2.9 0.0 - 4.4 ratio  Microalbumin / creatinine urine ratio  Result Value Ref Range   Creatinine, Urine WILL FOLLOW    Microalbumin, Urine WILL FOLLOW    Microalb/Creat Ratio WILL FOLLOW   Urinalysis, Routine w reflex microscopic  Result Value Ref Range   Specific Gravity, UA 1.020 1.005 - 1.030   pH, UA 6.0 5.0 - 7.5   Color, UA Yellow Yellow   Appearance Ur Cloudy (A) Clear   Leukocytes,UA 2+ (A) Negative   Protein,UA 1+ (A) Negative/Trace   Glucose, UA Negative Negative   Ketones, UA Negative Negative   RBC, UA Negative Negative   Bilirubin, UA Negative Negative   Urobilinogen, Ur 0.2 0.2 - 1.0 mg/dL   Nitrite, UA Negative Negative   Microscopic Examination See below:   POCT glucose (manual  entry)  Result Value Ref Range   POC Glucose 164 (A) 70 - 99 mg/dl  POCT glycosylated hemoglobin (Hb A1C)  Result Value Ref Range   Hemoglobin A1C     HbA1c POC (<> result, manual entry)     HbA1c, POC (prediabetic range)     HbA1c, POC (controlled diabetic range) 8.2 (A) 0.0 - 7.0 %  A1C down from 9.6 on last vist. Trulicity increased to 1.5 mg on last visit, Novolog changed to 28 units with 2 largest meals of the day and Glargine 58 units at nights.  She reports compliance with taking these medications as prescribed.  Gave last shot Trulicity 2 days ago,  currently on back order. Checks BS 4 days out of the wk. Does not have log with her.  BS this a.m was 171 before BF at home. Gives range 160s Eating:  still drinks Coke and snack on chips Exercise: not very much because "when I do move I ache."   HTN/CAD/HL Currently taking: see medication list.  She reports compliance with carvedilol 25 mg twice a day, amlodipine 10 mg daily, isosorbide 60 mg daily, atorvastatin 80 mg daily, Zetia 10 mg daily and aspirin 81 mg daily.  Took BP meds already for the morning. -not consistent  with checking BP. Not limiting salt as much as she should No CP, SOB sometimes on exertion.  No PND.  Has not seen cardiology in a while.  CKD 3b/microalbumin: not on NSAIDs.  Last 2 GFR ranged between 45-54.  Most recent was 45  Wants to be checked for UTI.  No dysuria but reports frequency with little urine coming out   Little depress waiting on partial dentures to come in.  Depression is not a major issue for her.  No suicidal ideation.  HM:  given rxn for shingles last visit.  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   History of radiation therapy 11/28/2018   Diabetes mellitus (HCC) 10/06/2018   Type 2 diabetes mellitus with hyperglycemia, with long-term current use of insulin (HCC) 10/06/2018   Adrenal adenoma, left 06/15/2018   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   Postmenopausal vaginal bleeding 06/15/2018   Hyperkalemia    Diabetic peripheral neuropathy (HCC) 09/03/2016   CAD (coronary artery disease) 04/10/2015   Severe obesity (BMI >= 40) (HCC) 09/20/2013   Hyperhydrosis disorder 09/20/2013   Hyperlipidemia 10/25/2012   Diabetes mellitus type 2, uncontrolled, with complications 07/26/2012   Essential hypertension 07/26/2012     Current Outpatient Medications on File Prior to Visit  Medication Sig Dispense Refill   Accu-Chek Softclix Lancets lancets Use to check blood sugar 3 times daily. 100 each 6   amLODipine (NORVASC) 10 MG tablet TAKE 1 TABLET (10  MG TOTAL) BY MOUTH DAILY. 90 tablet 1   aspirin EC 81 MG tablet Take 1 tablet (81 mg total) daily by mouth. 100 tablet 1   atorvastatin (LIPITOR) 80 MG tablet Take 1 tablet (80 mg total) by mouth once daily. (  PLEASE SCHEDULE AN APPT FOR FUTURE REFILLS) 90 tablet 0   Blood Glucose Monitoring Suppl (ACCU-CHEK GUIDE) w/Device KIT 1 kit by Does not apply route in the morning, at noon, and at bedtime. Use to check blood sugar 3 times daily. 1 kit 0   brimonidine (ALPHAGAN) 0.2 % ophthalmic solution Place 1 drop into both eyes 2 (two) times daily. 10 mL 6   carvedilol (COREG) 25 MG tablet TAKE 1 TABLET (25 MG TOTAL) BY MOUTH 2 (TWO) TIMES DAILY WITH A MEAL. 60 tablet 2   Continuous Blood Gluc Receiver (FREESTYLE LIBRE 2 READER) DEVI Use as directed 1 each 0   Continuous Blood Gluc Sensor (FREESTYLE LIBRE SENSOR SYSTEM) MISC Change sensor every 2 weeks 2 each 12   diclofenac Sodium (VOLTAREN) 1 % GEL Apply 2 g topically 2 (two) times daily as needed. 100 g 1   dorzolamide-timolol (COSOPT) 2-0.5 % ophthalmic solution Place 1 drop into both eyes 2 (two) times daily. 10 mL 3   Dulaglutide (TRULICITY) 1.5 MG/0.5ML SOPN Inject 1.5 mg into the skin once a week. 2 mL 6   ezetimibe (ZETIA) 10 MG tablet Take 1 tablet (10 mg total) by mouth daily. 90 tablet 2   glucose blood (ACCU-CHEK GUIDE) test strip Use to check blood sugar 3 times daily. 100 each 6   insulin glargine (LANTUS SOLOSTAR) 100 UNIT/ML Solostar Pen Inject 58 Units into the skin daily.(Must keep upcoming office visit for refills) 15 mL 0   insulin lispro (HUMALOG KWIKPEN) 100 UNIT/ML KwikPen Inject 28 Units into the skin in the morning and at bedtime. 15 mL 2   Insulin Pen Needle (BD PEN NEEDLE NANO U/F) 32G X 4 MM MISC Use as directed. 100 each 1   Insulin Syringe-Needle U-100 (BD VEO INSULIN SYRINGE U/F) 31G X 15/64" 0.3 ML MISC USE TO INJECT NOVOLOG THREE TIMES DAILY 100 each 2   isosorbide mononitrate (IMDUR) 60 MG 24 hr tablet TAKE 1 TABLET (60  MG TOTAL) BY MOUTH DAILY. 90 tablet 1   ketorolac (ACULAR) 0.5 % ophthalmic solution Place 1 drop into both eyes 4 (four) times daily. 10 mL 3   Na Sulfate-K Sulfate-Mg Sulf 17.5-3.13-1.6 GM/177ML SOLN Suprep (no substitutions)-TAKE AS DIRECTED. 354 mL 0   nitroGLYCERIN (NITROSTAT) 0.4 MG SL tablet Place 1 tab under tongue for chest pain.  May repeat after 5 minutes x 2.  DO NOT TAKE MORE THAN 3 TABS DURING AN EPISODE OF CHEST PAIN 25 tablet 6   potassium chloride (KLOR-CON) 10 MEQ tablet Take 2 tablets (20 mEq total) by mouth daily. 60 tablet 0   prednisoLONE acetate (PRED FORTE) 1 % ophthalmic suspension Place 1 drop into both eyes daily. 10 mL 1   ciprofloxacin (CIPRO) 250 MG tablet Take 1 tablet (250 mg total) by mouth 2 (two) times daily. 14 tablet 0   No current facility-administered medications on file prior to visit.    Allergies  Allergen Reactions   Jardiance [Empagliflozin] Other (See Comments)    Frequent yeast infection    Social History   Socioeconomic History   Marital status: Single    Spouse name: Not on file   Number of children: 2   Years of education: 12   Highest education level: GED or equivalent  Occupational History   Occupation: retired  Tobacco Use   Smoking status: Never   Smokeless tobacco: Never  Vaping Use   Vaping Use: Never used  Substance and Sexual Activity   Alcohol use: Not  Currently   Drug use: Not Currently    Types: Marijuana    Comment: every 2 weeks per pt   Sexual activity: Not Currently  Other Topics Concern   Not on file  Social History Narrative   Lives at home with her grandson.   Right-handed.   No daily caffeine use.   Social Determinants of Health   Financial Resource Strain: Low Risk  (10/22/2021)   Overall Financial Resource Strain (CARDIA)    Difficulty of Paying Living Expenses: Not very hard  Food Insecurity: Food Insecurity Present (10/22/2021)   Hunger Vital Sign    Worried About Running Out of Food in the Last  Year: Sometimes true    Ran Out of Food in the Last Year: Sometimes true  Transportation Needs: No Transportation Needs (10/22/2021)   PRAPARE - Administrator, Civil Service (Medical): No    Lack of Transportation (Non-Medical): No  Physical Activity: Inactive (10/22/2021)   Exercise Vital Sign    Days of Exercise per Week: 0 days    Minutes of Exercise per Session: 0 min  Stress: No Stress Concern Present (10/22/2021)   [employer] of Occupational Health - Occupational Stress Questionnaire    Feeling of Stress : Only a little  Social Connections: Socially Isolated (10/22/2021)   Social Connection and Isolation Panel [NHANES]    Frequency of Communication with Friends and Family: More than three times a week    Frequency of Social Gatherings with Friends and Family: Twice a week    Attends Religious Services: Never    Database administrator or Organizations: No    Attends Banker Meetings: Never    Marital Status: Never married  Intimate Partner Violence: Not At Risk (06/16/2017)   Humiliation, Afraid, Rape, and Kick questionnaire    Fear of Current or Ex-Partner: No    Emotionally Abused: No    Physically Abused: No    Sexually Abused: No    Family History  Problem Relation Age of Onset   Heart disease Mother    Hypertension Mother    Diabetes Mother    Cancer Father        hodgkins lymphoma   Heart disease Sister        heart attack   Diabetes Sister    Colon cancer Brother 68   Diabetes Maternal Aunt    Diabetes Maternal Uncle    Cancer Other        parent   Diabetes Other        parent   Heart disease Other        parent   Hyperlipidemia Other        parent   Hypertension Other        parent   Arthritis Other        parent   Breast cancer Neg Hx    Colon polyps Neg Hx    Esophageal cancer Neg Hx    Rectal cancer Neg Hx    Stomach cancer Neg Hx     Past Surgical History:  Procedure Laterality Date   BREAST BIOPSY  2012    benign   CARDIAC CATHETERIZATION N/A 02/06/2015   Procedure: Left Heart Cath and Coronary Angiography;  Surgeon: Laurey Morale, MD;  [facility] INVASIVE CV LAB;  Service: Cardiovascular;  Laterality: N/A;   CATARACT EXTRACTION Bilateral OD: 03/07/19, OS: 02/28/19   Dr. Nile Riggs   CATARACT EXTRACTION, BILATERAL     COLONOSCOPY  normal exam in [place], [place] abour 10-12 years ago   EYE SURGERY Bilateral OD: 03/07/19, OS: 02/28/19   Cat Sx - Dr. Nile Riggs   HYSTEROSCOPY WITH D & C N/A 08/01/2018   Procedure: DILATATION AND CURETTAGE /HYSTEROSCOPY;  Surgeon: Catalina Antigua, MD;  Location: [facility];  Service: Gynecology;  Laterality: N/A;   ROBOTIC ASSISTED TOTAL HYSTERECTOMY WITH BILATERAL SALPINGO OOPHERECTOMY N/A 11/28/2018   Procedure: XI ROBOTIC ASSISTED TOTAL HYSTERECTOMY WITH BILATERAL SALPINGO OOPHORECTOMY;  Surgeon: Adolphus Birchwood, MD;  Location: [facility];  Service: Gynecology;  Laterality: N/A;   SENTINEL NODE BIOPSY N/A 11/28/2018   Procedure: SENTINEL NODE BIOPSY;  Surgeon: Adolphus Birchwood, MD;  Location: [facility];  Service: Gynecology;  Laterality: N/A;   UMBILICAL HERNIA REPAIR  child    ROS: Review of Systems Negative except as stated above  PHYSICAL EXAM: BP 124/74   Pulse 89   Temp 98.5 F (36.9 C) (Oral)   Ht 5\' 4"  (1.626 m)   Wt (!) 303 lb (137.4 kg)   SpO2 96%   BMI 52.01 kg/m   Wt Readings from Last 3 Encounters:  08/06/22 (!) 303 lb (137.4 kg)  02/08/22 (!) 304 lb 3.2 oz (138 kg)  12/09/21 (!) 302 lb (137 kg)    Physical Exam  General appearance - alert, well appearing, morbidly obese older African-American female and in no distress Mental status - normal mood, behavior, speech, dress, motor activity, and thought processes Neck - supple, no significant adenopathy Chest - clear to auscultation, no wheezes, rales or rhonchi, symmetric air entry Heart - normal rate, regular rhythm, normal S1, S2, no murmurs, rubs, clicks or gallops Extremities -  peripheral pulses normal, no pedal edema, no clubbing or cyanosis     02/08/2022   10:53 AM 10/22/2021   11:47 AM 06/30/2021    3:59 PM  Depression screen PHQ 2/9  Decreased Interest 0 1 2  Down, Depressed, Hopeless 0 0 2  PHQ - 2 Score 0 1 4  Altered sleeping 0  0  Tired, decreased energy 3  2  Change in appetite 2  0  Feeling bad or failure about yourself  1  0  Trouble concentrating 1  0  Moving slowly or fidgety/restless 0  0  Suicidal thoughts 0  0  PHQ-9 Score 7  6       Latest Ref Rng & Units 08/06/2022   11:25 AM 01/04/2022    9:06 AM 10/07/2021    9:08 AM  CMP  Glucose 70 - 99 mg/dL 409  811    BUN 8 - 27 mg/dL 14  23    Creatinine 9.14 - 1.00 mg/dL 7.82  9.56    Sodium 213 - 144 mmol/L 144  139    Potassium 3.5 - 5.2 mmol/L 3.8  3.9  3.6   Chloride 96 - 106 mmol/L 104  100    CO2 20 - 29 mmol/L 27  24    Calcium 8.7 - 10.3 mg/dL 9.2  9.8    Total Protein 6.0 - 8.5 g/dL 7.3  7.5    Total Bilirubin 0.0 - 1.2 mg/dL 0.7  0.6    Alkaline Phos 44 - 121 IU/L 140  139    AST 0 - 40 IU/L 17  16    ALT 0 - 32 IU/L 18  21     Lipid Panel     Component Value Date/Time   CHOL 126 08/06/2022 1125   TRIG 110 08/06/2022 1125  HDL 44 08/06/2022 1125   CHOLHDL 2.9 08/06/2022 1125   CHOLHDL 5 07/15/2015 1513   VLDL 44.0 (H) 07/15/2015 1513   LDLCALC 62 08/06/2022 1125   LDLDIRECT 93.0 07/15/2015 1513    CBC    Component Value Date/Time   WBC 13.3 (H) 08/06/2022 1125   WBC 10.9 (H) 03/24/2020 1427   RBC 4.86 08/06/2022 1125   RBC 4.50 03/24/2020 1427   HGB 13.4 08/06/2022 1125   HCT 42.1 08/06/2022 1125   PLT 257 08/06/2022 1125   MCV 87 08/06/2022 1125   MCH 27.6 08/06/2022 1125   MCH 28.2 03/24/2020 1427   MCHC 31.8 08/06/2022 1125   MCHC 32.2 03/24/2020 1427   RDW 14.7 08/06/2022 1125   LYMPHSABS 1.0 12/30/2017 0812   MONOABS 1.3 (H) 12/30/2017 0812   EOSABS 0.1 12/30/2017 0812   BASOSABS 0.0 12/30/2017 0812    ASSESSMENT AND PLAN: 1. Type 2 diabetes  mellitus with morbid obesity (HCC) A1c has improved but still not at goal.  She will continue Lantus insulin 58 units and Humalog 28 units with the 2 largest meals of the day.  We discussed reaction to Trulicity to Ozempic since the 1.5 mg dose of Trulicity is currently on Sport and exercise psychologist.  I went over possible side effects of the medication are similar to Trulicity that they are in the same class.  She was agreeable to the change.  I had our clinical pharmacist meet with her to go over use and administration.  Prescription for Ozempic 0.5 mg once a week sent to the pharmacy. - POCT glucose (manual entry) - POCT glycosylated hemoglobin (Hb A1C) - CBC - Comprehensive metabolic panel - Lipid panel - Microalbumin / creatinine urine ratio  2. Stage 3b chronic kidney disease (HCC) Diabetes and hypertension are contributing factors.  Recheck chemistry today.  Advised to continue to avoid NSAIDs.  3. Coronary artery disease of native artery of native heart with stable angina pectoris (HCC) Stable.  Continue atorvastatin, zetia, carvedilol 25 mg twice a day, isosorbide and aspirin  4. Hypertension associated with diabetes (HCC) At goal. Continue amlodipine 10 mg daily, carvedilol 25 mg daily, isosorbide 60 mg daily  5. Urinary frequency - Urinalysis, Routine w reflex microscopic  6. Mild major depression (HCC) Patient states this is not a major issue for her.  She declines medication or counseling.     Patient was given the opportunity to ask questions.  Patient verbalized understanding of the plan and was able to repeat key elements of the plan.   This documentation was completed using Paediatric nurse.  Any transcriptional errors are unintentional.  Orders Placed This Encounter  Procedures   Microscopic Examination   CBC   Comprehensive metabolic panel   Lipid panel   Microalbumin / creatinine urine ratio   Urinalysis, Routine w reflex microscopic   POCT glucose  (manual entry)   POCT glycosylated hemoglobin (Hb A1C)     Requested Prescriptions    No prescriptions requested or ordered in this encounter    No follow-ups on file.  Jonah Blue, MD, FACP

## 2022-08-07 ENCOUNTER — Encounter: Payer: Self-pay | Admitting: Internal Medicine

## 2022-08-07 ENCOUNTER — Telehealth: Payer: Self-pay | Admitting: Internal Medicine

## 2022-08-07 ENCOUNTER — Other Ambulatory Visit: Payer: Self-pay | Admitting: Internal Medicine

## 2022-08-07 MED ORDER — CIPROFLOXACIN HCL 500 MG PO TABS: 500.0000 mg | ORAL_TABLET | Freq: Two times a day (BID) | ORAL | 0 refills | Status: AC

## 2022-08-07 MED ORDER — SEMAGLUTIDE(0.25 OR 0.5MG/DOS) 2 MG/3ML ~~LOC~~ SOPN
0.5000 mg | PEN_INJECTOR | SUBCUTANEOUS | 3 refills | Status: DC
  Filled 2022-08-07: qty 3, 28d supply, fill #0
  Filled 2022-08-30: qty 3, 28d supply, fill #1
  Filled 2022-09-23: qty 3, 28d supply, fill #2
  Filled 2022-10-24: qty 3, 28d supply, fill #3

## 2022-08-07 NOTE — Telephone Encounter (Signed)
PC placed to pt today to go over lab results.  LVMM informing of who I am and reason for calling and that I will try to reach her again tomorrow.

## 2022-08-08 ENCOUNTER — Telehealth: Payer: Self-pay | Admitting: Internal Medicine

## 2022-08-08 NOTE — Telephone Encounter (Signed)
Phone call placed to patient again this afternoon to go over the results of lab test.  Verified patient with name and date of birth.  I informed her that urinalysis shows that she does have a urinary tract infection and that I have sent antibiotics to [facility] [address] for her to pick up. I also informed her that liver function tests shows mild elevation in alkaline phosphatase but it is stable compared to when it was last checked in August of last year.  Kidney function has improved.  Cholesterol levels are good.  I also let her know that I have sent the prescription for the Ozempic to our pharmacy since there is a [place] shortage on the 1.5 mg dose of Trulicity.  She confirms that she did meet with the clinical pharmacist before she left on Friday and was shown how to give the Ozempic injection.  Patient was going to ask another question but the phone call was dropped.  I tried calling her back twice and each time it went to voicemail.

## 2022-08-09 ENCOUNTER — Other Ambulatory Visit: Payer: Self-pay

## 2022-08-09 LAB — CBC
Hematocrit: 42.1 % (ref 34.0–46.6)
Hemoglobin: 13.4 g/dL (ref 11.1–15.9)
MCH: 27.6 pg (ref 26.6–33.0)
MCHC: 31.8 g/dL (ref 31.5–35.7)
MCV: 87 fL (ref 79–97)
Platelets: 257 10*3/uL (ref 150–450)
RBC: 4.86 x10E6/uL (ref 3.77–5.28)
RDW: 14.7 % (ref 11.7–15.4)
WBC: 13.3 10*3/uL — ABNORMAL HIGH (ref 3.4–10.8)

## 2022-08-09 LAB — COMPREHENSIVE METABOLIC PANEL
ALT: 18 IU/L (ref 0–32)
AST: 17 IU/L (ref 0–40)
Albumin/Globulin Ratio: 1.2 (ref 1.2–2.2)
Albumin: 4 g/dL (ref 3.9–4.9)
Alkaline Phosphatase: 140 IU/L — ABNORMAL HIGH (ref 44–121)
BUN/Creatinine Ratio: 13 (ref 12–28)
BUN: 14 mg/dL (ref 8–27)
Bilirubin Total: 0.7 mg/dL (ref 0.0–1.2)
CO2: 27 mmol/L (ref 20–29)
Calcium: 9.2 mg/dL (ref 8.7–10.3)
Chloride: 104 mmol/L (ref 96–106)
Creatinine, Ser: 1.12 mg/dL — ABNORMAL HIGH (ref 0.57–1.00)
Globulin, Total: 3.3 g/dL (ref 1.5–4.5)
Glucose: 128 mg/dL — ABNORMAL HIGH (ref 70–99)
Potassium: 3.8 mmol/L (ref 3.5–5.2)
Sodium: 144 mmol/L (ref 134–144)
Total Protein: 7.3 g/dL (ref 6.0–8.5)
eGFR: 55 mL/min/{1.73_m2} — ABNORMAL LOW (ref >59)

## 2022-08-09 LAB — MICROSCOPIC EXAMINATION
Casts: NONE SEEN /lpf
RBC, Urine: NONE SEEN /hpf (ref 0–2)
WBC, UA: >30 /hpf — AB (ref 0–5)

## 2022-08-09 LAB — URINALYSIS, ROUTINE W REFLEX MICROSCOPIC
Bilirubin, UA: NEGATIVE
Glucose, UA: NEGATIVE
Ketones, UA: NEGATIVE
Nitrite, UA: NEGATIVE
RBC, UA: NEGATIVE
Specific Gravity, UA: 1.02 (ref 1.005–1.030)
Urobilinogen, Ur: 0.2 mg/dL (ref 0.2–1.0)
pH, UA: 6 (ref 5.0–7.5)

## 2022-08-09 LAB — LIPID PANEL
Chol/HDL Ratio: 2.9 ratio (ref 0.0–4.4)
Cholesterol, Total: 126 mg/dL (ref 100–199)
HDL: 44 mg/dL (ref >39)
LDL Chol Calc (NIH): 62 mg/dL (ref 0–99)
Triglycerides: 110 mg/dL (ref 0–149)
VLDL Cholesterol Cal: 20 mg/dL (ref 5–40)

## 2022-08-09 LAB — MICROALBUMIN / CREATININE URINE RATIO
Creatinine, Urine: 182 mg/dL
Microalb/Creat Ratio: 44 mg/g creat — ABNORMAL HIGH (ref 0–29)
Microalbumin, Urine: 79.4 ug/mL

## 2022-08-18 ENCOUNTER — Ambulatory Visit (INDEPENDENT_AMBULATORY_CARE_PROVIDER_SITE_OTHER): Payer: 59 | Admitting: Ophthalmology

## 2022-08-18 ENCOUNTER — Encounter (INDEPENDENT_AMBULATORY_CARE_PROVIDER_SITE_OTHER): Payer: Self-pay | Admitting: Ophthalmology

## 2022-08-18 DIAGNOSIS — E113313 Type 2 diabetes mellitus with moderate nonproliferative diabetic retinopathy with macular edema, bilateral: Secondary | ICD-10-CM | POA: Diagnosis not present

## 2022-08-18 DIAGNOSIS — I10 Essential (primary) hypertension: Secondary | ICD-10-CM

## 2022-08-18 DIAGNOSIS — Z961 Presence of intraocular lens: Secondary | ICD-10-CM

## 2022-08-18 DIAGNOSIS — H35353 Cystoid macular degeneration, bilateral: Secondary | ICD-10-CM | POA: Diagnosis not present

## 2022-08-18 DIAGNOSIS — H40053 Ocular hypertension, bilateral: Secondary | ICD-10-CM

## 2022-08-18 DIAGNOSIS — H04123 Dry eye syndrome of bilateral lacrimal glands: Secondary | ICD-10-CM

## 2022-08-18 DIAGNOSIS — H35033 Hypertensive retinopathy, bilateral: Secondary | ICD-10-CM | POA: Diagnosis not present

## 2022-08-18 MED ORDER — AFLIBERCEPT 2MG/0.05ML IZ SOLN FOR KALEIDOSCOPE
2.0000 mg | INTRAVITREAL | Status: AC | PRN
  Administered 2022-08-18: 2 mg via INTRAVITREAL

## 2022-08-18 MED ORDER — FARICIMAB-SVOA 6 MG/0.05ML IZ SOLN
6.0000 mg | INTRAVITREAL | Status: AC | PRN
  Administered 2022-08-18: 6 mg via INTRAVITREAL

## 2022-08-26 ENCOUNTER — Other Ambulatory Visit: Payer: Self-pay | Admitting: Internal Medicine

## 2022-08-26 ENCOUNTER — Other Ambulatory Visit: Payer: Self-pay

## 2022-08-26 DIAGNOSIS — E1165 Type 2 diabetes mellitus with hyperglycemia: Secondary | ICD-10-CM

## 2022-08-26 MED ORDER — LANTUS SOLOSTAR 100 UNIT/ML ~~LOC~~ SOPN
58.0000 [IU] | PEN_INJECTOR | Freq: Every day | SUBCUTANEOUS | 0 refills | Status: DC
  Filled 2022-08-26: qty 15, 25d supply, fill #0

## 2022-08-30 ENCOUNTER — Other Ambulatory Visit: Payer: Self-pay | Admitting: Internal Medicine

## 2022-08-30 ENCOUNTER — Other Ambulatory Visit: Payer: Self-pay | Admitting: Cardiology

## 2022-08-30 ENCOUNTER — Other Ambulatory Visit: Payer: Self-pay

## 2022-08-30 DIAGNOSIS — E1169 Type 2 diabetes mellitus with other specified complication: Secondary | ICD-10-CM

## 2022-08-30 MED ORDER — ATORVASTATIN CALCIUM 80 MG PO TABS
80.0000 mg | ORAL_TABLET | Freq: Every day | ORAL | 1 refills | Status: DC
  Filled 2022-08-30: qty 90, 90d supply, fill #0
  Filled 2022-09-23 – 2022-12-21 (×2): qty 90, 90d supply, fill #1

## 2022-08-30 NOTE — Telephone Encounter (Signed)
Rx request sent to pharmacy.  

## 2022-08-31 ENCOUNTER — Other Ambulatory Visit: Payer: Self-pay

## 2022-09-01 ENCOUNTER — Other Ambulatory Visit: Payer: Self-pay | Admitting: Internal Medicine

## 2022-09-01 ENCOUNTER — Other Ambulatory Visit: Payer: Self-pay

## 2022-09-01 DIAGNOSIS — I2510 Atherosclerotic heart disease of native coronary artery without angina pectoris: Secondary | ICD-10-CM

## 2022-09-01 DIAGNOSIS — I10 Essential (primary) hypertension: Secondary | ICD-10-CM

## 2022-09-01 MED ORDER — CARVEDILOL 25 MG PO TABS
ORAL_TABLET | Freq: Two times a day (BID) | ORAL | 1 refills | Status: DC
  Filled 2022-09-01: qty 180, 90d supply, fill #0
  Filled 2022-09-23 – 2022-12-20 (×2): qty 180, 90d supply, fill #1

## 2022-09-02 ENCOUNTER — Other Ambulatory Visit: Payer: Self-pay

## 2022-09-10 ENCOUNTER — Other Ambulatory Visit: Payer: Self-pay

## 2022-09-16 NOTE — Progress Notes (Addendum)
[facility] - Clinic Note  09/22/2022     CHIEF COMPLAINT Patient presents for Retina Follow Up   HISTORY OF PRESENT ILLNESS: Kimberly Frazier is a 62 y.o. female who presents to the clinic today for:   HPI     Retina Follow Up   Patient presents with  Diabetic Retinopathy.  In both eyes.  This started years ago.  Duration of 5 weeks.  Since onset it is stable.  I, the attending physician,  performed the HPI with the patient and updated documentation appropriately.        Comments   Patient feels that the vision is the same since her last apt 5 weeks ago. She is using Pred OU QD, Ketorolac OU QID, Dorz/Timolol OU BID, and Alphagan OU BID.  Her blood sugar was 136.      Last edited by Rennis Chris, MD on 09/22/2022 11:23 AM.     Referring physician: Marcine Matar, MD [address] [place],  [place] [address]  HISTORICAL INFORMATION:   Selected notes from the MEDICAL RECORD NUMBER Referred by Dr. Jethro Bolus for concern of CME    CURRENT MEDICATIONS: Current Outpatient Medications (Ophthalmic Drugs)  Medication Sig   brimonidine (ALPHAGAN) 0.2 % ophthalmic solution Place 1 drop into both eyes 2 (two) times daily.   dorzolamide-timolol (COSOPT) 2-0.5 % ophthalmic solution Place 1 drop into both eyes 2 (two) times daily.   ketorolac (ACULAR) 0.5 % ophthalmic solution Place 1 drop into both eyes 4 (four) times daily.   prednisoLONE acetate (PRED FORTE) 1 % ophthalmic suspension Place 1 drop into both eyes daily.   No current facility-administered medications for this visit. (Ophthalmic Drugs)   Current Outpatient Medications (Other)  Medication Sig   Accu-Chek Softclix Lancets lancets Use to check blood sugar 3 times daily.   amLODipine (NORVASC) 10 MG tablet TAKE 1 TABLET (10 MG TOTAL) BY MOUTH DAILY.   aspirin EC 81 MG tablet Take 1 tablet (81 mg total) daily by mouth.   atorvastatin (LIPITOR) 80 MG tablet Take 1 tablet (80 mg total) by  mouth once daily. (PLEASE SCHEDULE AN APPT FOR FUTURE REFILLS)   Blood Glucose Monitoring Suppl (ACCU-CHEK GUIDE) w/Device KIT 1 kit by Does not apply route in the morning, at noon, and at bedtime. Use to check blood sugar 3 times daily.   carvedilol (COREG) 25 MG tablet TAKE 1 TABLET (25 MG TOTAL) BY MOUTH 2 (TWO) TIMES DAILY WITH A MEAL.   Continuous Blood Gluc Receiver (FREESTYLE LIBRE 2 READER) DEVI Use as directed   Continuous Blood Gluc Sensor (FREESTYLE LIBRE SENSOR SYSTEM) MISC Change sensor every 2 weeks   diclofenac Sodium (VOLTAREN) 1 % GEL Apply 2 g topically 2 (two) times daily as needed.   ezetimibe (ZETIA) 10 MG tablet Take 1 tablet (10 mg total) by mouth daily.   glucose blood (ACCU-CHEK GUIDE) test strip Use to check blood sugar 3 times daily.   insulin glargine (LANTUS SOLOSTAR) 100 UNIT/ML Solostar Pen Inject 58 Units into the skin daily.(Must keep upcoming office visit for refills)   insulin lispro (HUMALOG KWIKPEN) 100 UNIT/ML KwikPen Inject 28 Units into the skin in the morning and at bedtime.   Insulin Pen Needle (BD PEN NEEDLE NANO U/F) 32G X 4 MM MISC Use as directed.   Insulin Syringe-Needle U-100 (BD VEO INSULIN SYRINGE U/F) 31G X 15/64" 0.3 ML MISC USE TO INJECT NOVOLOG THREE TIMES DAILY   isosorbide mononitrate (IMDUR)  60 MG 24 hr tablet TAKE 1 TABLET (60 MG TOTAL) BY MOUTH DAILY.   Na Sulfate-K Sulfate-Mg Sulf 17.5-3.13-1.6 GM/177ML SOLN Suprep (no substitutions)-TAKE AS DIRECTED.   nitroGLYCERIN (NITROSTAT) 0.4 MG SL tablet Place 1 tab under tongue for chest pain.  May repeat after 5 minutes x 2.  DO NOT TAKE MORE THAN 3 TABS DURING AN EPISODE OF CHEST PAIN   potassium chloride (KLOR-CON) 10 MEQ tablet Take 2 tablets (20 mEq total) by mouth daily.   Semaglutide,0.25 or 0.5MG /DOS, 2 MG/3ML SOPN Inject 0.5 mg into the skin once a week.   No current facility-administered medications for this visit. (Other)   REVIEW OF SYSTEMS: ROS   Positive for: Endocrine,  Cardiovascular, Eyes Negative for: Constitutional, Gastrointestinal, Neurological, Skin, Genitourinary, Musculoskeletal, HENT, Respiratory, Psychiatric, Allergic/Imm, Heme/Lymph Last edited by Julieanne Cotton, COT on 09/22/2022  9:17 AM.      ALLERGIES Allergies  Allergen Reactions   Jardiance [Empagliflozin] Other (See Comments)    Frequent yeast infection   PAST MEDICAL HISTORY Past Medical History:  Diagnosis Date   Adrenal adenoma, left    Arthritis    Cancer    CKD (chronic kidney disease), stage II    Coronary artery disease 07-28-2018 followed [facility])  currently due to no insurance   per cardiac cath 02-06-2015 (positive mild lateral ishcemia on stress test)--- dLAD 80%,  mLAD 40%,  mPDA 80% (small vessel),  ostial D1 70%,  CFx with lumial irregarlities-- medical management   Depression    Diabetic neuropathy    Diabetic retinopathy    NPDR OU   Headache    History of Bell's palsy 07/2011   per pt residual facial pain on left side occasionally   History of cancer of vagina 1999   per pt completed radiation and chemo   History of sepsis 12/30/2017   positive blood culter fro E.coli   Hyperlipidemia    Hypertension    Hypertensive retinopathy    OU   Hypokalemia    Insulin dependent type 2 diabetes mellitus, uncontrolled    followed by pcp---  A1c was 11.7 on 06-15-2018 in epic   Myocardial infarction    10 yrs ago?   Nocturia    Peripheral neuropathy    PMB (postmenopausal bleeding)    Wears dentures    upper   Wears glasses    Past Surgical History:  Procedure Laterality Date   BREAST BIOPSY  2012   benign   CARDIAC CATHETERIZATION N/A 02/06/2015   Procedure: Left Heart Cath and Coronary Angiography;  Surgeon: Laurey Morale, MD;  Location: [facility] INVASIVE CV LAB;  Service: Cardiovascular;  Laterality: N/A;   CATARACT EXTRACTION Bilateral OD: 03/07/19, OS: 02/28/19   Dr. Nile Riggs   CATARACT EXTRACTION, BILATERAL     COLONOSCOPY      normal exam in [place], [place] abour 10-12 years ago   EYE SURGERY Bilateral OD: 03/07/19, OS: 02/28/19   Cat Sx - Dr. Nile Riggs   HYSTEROSCOPY WITH D & C N/A 08/01/2018   Procedure: DILATATION AND CURETTAGE /HYSTEROSCOPY;  Surgeon: Catalina Antigua, MD;  Location: [facility];  Service: Gynecology;  Laterality: N/A;   ROBOTIC ASSISTED TOTAL HYSTERECTOMY WITH BILATERAL SALPINGO OOPHERECTOMY N/A 11/28/2018   Procedure: XI ROBOTIC ASSISTED TOTAL HYSTERECTOMY WITH BILATERAL SALPINGO OOPHORECTOMY;  Surgeon: Adolphus Birchwood, MD;  Location: [facility];  Service: Gynecology;  Laterality: N/A;   SENTINEL NODE BIOPSY N/A 11/28/2018   Procedure: SENTINEL NODE BIOPSY;  Surgeon: Adolphus Birchwood,  MD;  Location: [facility];  Service: Gynecology;  Laterality: N/A;   UMBILICAL HERNIA REPAIR  child   FAMILY HISTORY Family History  Problem Relation Age of Onset   Heart disease Mother    Hypertension Mother    Diabetes Mother    Cancer Father        hodgkins lymphoma   Heart disease Sister        heart attack   Diabetes Sister    Colon cancer Brother 32   Diabetes Maternal Aunt    Diabetes Maternal Uncle    Cancer Other        parent   Diabetes Other        parent   Heart disease Other        parent   Hyperlipidemia Other        parent   Hypertension Other        parent   Arthritis Other        parent   Breast cancer Neg Hx    Colon polyps Neg Hx    Esophageal cancer Neg Hx    Rectal cancer Neg Hx    Stomach cancer Neg Hx    SOCIAL HISTORY Social History   Tobacco Use   Smoking status: Never   Smokeless tobacco: Never  Vaping Use   Vaping Use: Never used  Substance Use Topics   Alcohol use: Not Currently   Drug use: Not Currently    Types: Marijuana    Comment: every 2 weeks per pt       OPHTHALMIC EXAM:  Base Eye Exam     Visual Acuity (Snellen - Linear)       Right Left   Dist [place] 20/20 20/150 +1   Dist ph [place]  20/100 +2         Tonometry (Tonopen, 9:21 AM)        Right Left   Pressure 23 24         Pupils       Dark Light Shape React APD   Right 3 2 Round Brisk None   Left 3 2 Round Brisk None         Visual Fields       Left Right    Full Full         Extraocular Movement       Right Left    Full, Ortho Full, Ortho         Neuro/Psych     Oriented x3: Yes   Mood/Affect: Normal         Dilation     Both eyes: 1.0% Mydriacyl, 2.5% Phenylephrine @ 9:18 AM           Slit Lamp and Fundus Exam     Slit Lamp Exam       Right Left   Lids/Lashes Dermatochalasis - upper lid Dermatochalasis - upper lid   Conjunctiva/Sclera Mild Melanosis Mild Melanosis   Cornea 1+ Punctate epithelial erosions 2+ central punctate epithelial erosions, decreased TBUT, mild tear film debris, well healed temporal cataract wounds   Anterior Chamber Deep, 0.5+cell/pigment Deep, 0.5+cell/pigment   Iris Round and dilated, No NVI Round and dilated, No NVI   Lens PC IOL in good position with trace PCO PC IOL in good position with 1+PCO   Anterior Vitreous Vitreous syneresis, Posterior vitreous detachment Vitreous syneresis, Posterior vitreous detachment, vitreous condensations         Fundus Exam  Right Left   Disc Pink and Sharp Compact, Pink and Sharp, temporal PPA   C/D Ratio 0.1 0.1   Macula Good foveal reflex, persistent cystic changes / edema inferior macula, scattered Microaneurysms Blunted foveal reflex, central edema - slightly improved, +exudates/MA/IRH -- greatest temporal macula -- improved, +central atrophy, no heme   Vessels attenuated, Tortuous attenuated, Tortuous   Periphery Attached, scattered drusen, rare, scattered DBH, reticular degeneration Attached, scattered drusen, rare; scattered DBH--greatest inferior to disc, reticular degeneration           IMAGING AND PROCEDURES  Imaging and Procedures for 09/22/2022  OCT, Retina - OU - Both Eyes       Right Eye Quality was good. Central Foveal Thickness: 255.  Progression has been stable. Findings include no SRF, abnormal foveal contour, intraretinal hyper-reflective material, intraretinal fluid, vitreomacular adhesion (Persistent IRF/cystic changes inferior macula and fovea ).   Left Eye Quality was good. Central Foveal Thickness: 251. Progression has improved. Findings include no SRF, abnormal foveal contour, retinal drusen , subretinal hyper-reflective material, intraretinal hyper-reflective material, intraretinal fluid, outer retinal atrophy, vitreomacular adhesion (Mild interval improvement in IRF/IRHM inferior and temporal mac and fovea, +central ORA).   Notes *Images captured and stored on drive  Diagnosis / Impression:  Macular edema OU - likely combination of CME + DME OU OD: Persistent IRF/cystic changes inferior macula and fovea  OS: interval improvement in IRF/IRHM inferior and temporal mac and fovea, +central ORA  Clinical management:  See below  Abbreviations: NFP - Normal foveal profile. CME - cystoid macular edema. PED - pigment epithelial detachment. IRF - intraretinal fluid. SRF - subretinal fluid. EZ - ellipsoid zone. ERM - epiretinal membrane. ORA - outer retinal atrophy. ORT - outer retinal tubulation. SRHM - subretinal hyper-reflective material      Intravitreal Injection, Pharmacologic Agent - OD - Right Eye       Time Out 09/22/2022. 10:03 AM. Confirmed correct patient, procedure, site, and patient consented.   Anesthesia Topical anesthesia was used. Anesthetic medications included Lidocaine 2%, Proparacaine 0.5%.   Procedure Preparation included 5% betadine to ocular surface, eyelid speculum. A (32g) needle was used.   Injection: 2 mg aflibercept 2 MG/0.05ML   Route: Intravitreal, Site: Right Eye   NDC: L6038910, Lot: [id number], Expiration date: 08/29/2023, Waste: 0 mL   Post-op Post injection exam found visual acuity of at least counting fingers. The patient tolerated the procedure well. There were no  complications. The patient received written and verbal post procedure care education.   Notes An AC tap was performed following injection due to elevated IOP using a 30 gauge needle on a syringe with the plunger removed. The needle was placed at the limbus at 7 oclock and approximately 0.08 cc of aqueous was removed from the anterior chamber. Betadine was applied to the tap area before and after the paracentesis was performed. There were no complications. The patient tolerated the procedure well. The IOP was rechecked and was found to be ~11 mmHg by digital palpation.      Intravitreal Injection, Pharmacologic Agent - OS - Left Eye       Time Out 09/22/2022. 10:03 AM. Confirmed correct patient, procedure, site, and patient consented.   Anesthesia Topical anesthesia was used. Anesthetic medications included Lidocaine 2%, Proparacaine 0.5%.   Procedure Preparation included 5% betadine to ocular surface, eyelid speculum. A (32g) needle was used.   Injection: 6 mg faricimab-svoa 6 MG/0.05ML   Route: Intravitreal, Site: Left Eye   NDC: O8010301, Lot: [id number],  Expiration date: 08/28/2024, Waste: 0 mL   Post-op Post injection exam found visual acuity of at least counting fingers. The patient tolerated the procedure well. There were no complications. The patient received written and verbal post procedure care education. Post injection medications were not given.            ASSESSMENT/PLAN:    ICD-10-CM   1. 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 CME (cystoid macular edema), bilateral  H35.353     3. Essential hypertension  I10     4.  Hypertensive retinopathy of both eyes  H35.033     5. Pseudophakia of both eyes  Z96.1     6. Bilateral ocular hypertension  H40.053     7. Dry eyes, bilateral  H04.123      1,2. Moderate Non-proliferative diabetic retinopathy, OU OD with combination DME/CME  - last A1c 8.2 on 03.08.24; 9.6 on 08.07.23, 10.1 on 1.31.23  - pt now on Ozempic as of Mar 2024 - h/o delayed to follow up from September 2021 to April 2022--7 mos instead of 4 wks**             - exam shows scattered IRH, edema and tortuous blood vessels OU - FA (10.23.20) shows leaking MA OU and paracentral petaloid hyperfluorecence OD --> CME/[place] - OCT shows diabetic macular edema OU; OD with CME/[place] component contributing as well - s/p IVA OD #1 (11.06.20), #2 (12.18.20), #3 (02.24.21), #4 (03.24.21), #5 (04.21.21), #6 (5.26.21), #7 (07.26.21), #8 (08.25.21), #9 (09.23.21), #10 (04.26.22) -- IVA resistance - s/p IVA OS #1 (10.23.20), #2 (11.18.20), #3 (12.18.20), #4 (02.24.21), #5 (03.24.21) -- IVA resistance  ======================================================== - s/p IVE OD #1 (05.25.22), #2 (07.01.22), #3 (07.29.22), #4 (08.29.22 - JDM), #5 (09.26.22), #6 (10.24.22), #7 (11.21.22), #8 (12.19.22), #9 (01.23.23), #10 (02.20.23), #11 (03.22.23), #12 (04.19.23), #13 (05.17.23), #14 (06.14.23), #15 (08.04.23), #16 (09.01.23), #17 (10.10.23), #18 (11.14.23), #19 (12.19.23), #20 (02.13.24), #21 (03.20.24) - s/p IVE OS #1 (04.21.21), #2 (5.26.21), #3 (07.26.21), #4 (08.25.21), #5 (09.23.21), #6 (04.26.22), #7 (05.25.22), #8 (07.01.22), #9 (07.29.22), #10 (08.29.22 - JDM), #11 (09.26.22), #12 (10.24.22) -- IVE resistance ======================================================== - s/p IV Vabysmo OS #1 (11.21.22, sample), #2 (12.19.22), #3 (01.23.23), #4 (02.20.23), #5 (03.22.23), #6 (04.19.23), #7 (05.17.23), #8 (06.14.23, sample), #9 (08.04.23), #10 (9.01.23), #11 (10.10.23), #12 (11.14.23), #13 (12.19.23), #14  (02.13.24), #15 (03.20.24) - BCVA OD 20/20; OS 20/150 stable - OCT today: OD: Persistent IRF/cystic changes inferior macula and fovea  OS: interval improvement in IRF/IRHM inferior and temporal mac and fovea, +central ORA at 5 weeks - recommend IVE OD #22 and IVV OS #16 today, 03.20.24 with follow up in 5 weeks  - pt wishes to proceed w/ injections             - RBA of procedure discussed, questions answered             - informed consent obtained             - Avastin informed consent form signed and scanned on 12.18.2020       - Eylea OU informed consent form re-signed and scanned on 05.17.23             -  Vabysmo OS consent form signed and scanned on 11.21.23             - see procedure note             - Eylea4U benefits investigation started, 04.21.21             - discontinue Pred Forte qd OU, continue Ketorolac QID OU             - f/u in 5 weeks, DFE, OCT, possible injection OU   3,4. Hypertensive retinopathy OU             - discussed importance of tight BP control             - continue to monitor   5. Pseudophakia OU            - s/p CE/IOL OU Nile Riggs)                        OD: 03/07/2019                        OS: 02/28/2019             - IOLs in good position             - CME OU as above             - continue to monitor   6. Ocular Hypertension OU             - IOP: 23, 24 -- pt was late on taking drops this morning - h/o steroid response             - cont Cosopt BID OU and Brimonidine BID OU  7. Dry eyes OU - recommend artificial tears and lubricating ointment as needed   Ophthalmic Meds Ordered this visit:  Meds ordered this encounter  Medications   faricimab-svoa (VABYSMO) 6mg /0.11mL intravitreal injection   aflibercept (EYLEA) SOLN 2 mg     Return in about 5 weeks (around 10/27/2022) for f/u Mod NPDR OU, DFE, OCT, Possible injections.  There are no Patient Instructions on file for this visit.  Explained the diagnoses, plan, and follow up with the  patient and they expressed understanding.  Patient expressed understanding of the importance of proper follow up care.    This document serves as a record of services personally performed by Karie Chimera, MD, PhD. It was created on their behalf by De Blanch, an ophthalmic technician. The creation of this record is the provider's dictation and/or activities during the visit.    Electronically signed by: De Blanch, OA, 09/22/22  11:26 AM  This document serves as a record of services personally performed by Karie Chimera, MD, PhD. It was created on their behalf by Gerilyn Nestle, COT an ophthalmic technician. The creation of this record is the provider's dictation and/or activities during the visit.    Electronically signed by:  Gerilyn Nestle, COT  04.24.24 11:26 AM   Karie Chimera, M.D., Ph.D. Diseases & Surgery of the Retina and Vitreous [facility]  I have reviewed the above documentation for accuracy and completeness, and I agree with the above. Karie Chimera, M.D., Ph.D. 09/22/22 11:26 AM   Abbreviations: M myopia (nearsighted); A astigmatism; H hyperopia (farsighted); P presbyopia; Mrx spectacle prescription;  CTL contact lenses; OD right eye; OS left eye; OU both eyes  XT exotropia;  ET esotropia; PEK punctate epithelial keratitis; PEE punctate epithelial erosions; DES dry eye syndrome; MGD meibomian gland dysfunction; ATs artificial tears; PFAT's preservative free artificial tears; NSC nuclear sclerotic cataract; PSC posterior subcapsular cataract; ERM epi-retinal membrane; PVD posterior vitreous detachment; RD retinal detachment; DM diabetes mellitus; DR diabetic retinopathy; NPDR non-proliferative diabetic retinopathy; PDR proliferative diabetic retinopathy; CSME clinically significant macular edema; DME diabetic macular edema; dbh dot blot hemorrhages; CWS cotton wool spot; POAG primary open angle glaucoma; C/D cup-to-disc ratio; HVF  humphrey visual field; GVF goldmann visual field; OCT optical coherence tomography; IOP intraocular pressure; BRVO Branch retinal vein occlusion; CRVO central retinal vein occlusion; CRAO central retinal artery occlusion; BRAO branch retinal artery occlusion; RT retinal tear; SB scleral buckle; PPV pars plana vitrectomy; VH Vitreous hemorrhage; PRP panretinal laser photocoagulation; IVK intravitreal kenalog; VMT vitreomacular traction; MH Macular hole;  NVD neovascularization of the disc; NVE neovascularization elsewhere; AREDS age related eye disease study; ARMD age related macular degeneration; POAG primary open angle glaucoma; EBMD epithelial/anterior basement membrane dystrophy; ACIOL anterior chamber intraocular lens; IOL intraocular lens; PCIOL posterior chamber intraocular lens; Phaco/IOL phacoemulsification with intraocular lens placement; PRK photorefractive keratectomy; LASIK laser assisted in situ keratomileusis; HTN hypertension; DM diabetes mellitus; COPD chronic obstructive pulmonary disease

## 2022-09-22 ENCOUNTER — Ambulatory Visit (INDEPENDENT_AMBULATORY_CARE_PROVIDER_SITE_OTHER): Payer: 59 | Admitting: Ophthalmology

## 2022-09-22 ENCOUNTER — Encounter (INDEPENDENT_AMBULATORY_CARE_PROVIDER_SITE_OTHER): Payer: Self-pay | Admitting: Ophthalmology

## 2022-09-22 DIAGNOSIS — I10 Essential (primary) hypertension: Secondary | ICD-10-CM

## 2022-09-22 DIAGNOSIS — Z961 Presence of intraocular lens: Secondary | ICD-10-CM | POA: Diagnosis not present

## 2022-09-22 DIAGNOSIS — E113313 Type 2 diabetes mellitus with moderate nonproliferative diabetic retinopathy with macular edema, bilateral: Secondary | ICD-10-CM

## 2022-09-22 DIAGNOSIS — Z794 Long term (current) use of insulin: Secondary | ICD-10-CM

## 2022-09-22 DIAGNOSIS — H35353 Cystoid macular degeneration, bilateral: Secondary | ICD-10-CM | POA: Diagnosis not present

## 2022-09-22 DIAGNOSIS — H04123 Dry eye syndrome of bilateral lacrimal glands: Secondary | ICD-10-CM | POA: Diagnosis not present

## 2022-09-22 DIAGNOSIS — H35033 Hypertensive retinopathy, bilateral: Secondary | ICD-10-CM | POA: Diagnosis not present

## 2022-09-22 DIAGNOSIS — H40053 Ocular hypertension, bilateral: Secondary | ICD-10-CM | POA: Diagnosis not present

## 2022-09-22 MED ORDER — FARICIMAB-SVOA 6 MG/0.05ML IZ SOLN
6.0000 mg | INTRAVITREAL | Status: AC | PRN
  Administered 2022-09-22: 6 mg via INTRAVITREAL

## 2022-09-22 MED ORDER — AFLIBERCEPT 2MG/0.05ML IZ SOLN FOR KALEIDOSCOPE
2.0000 mg | INTRAVITREAL | Status: AC | PRN
  Administered 2022-09-22: 2 mg via INTRAVITREAL

## 2022-09-23 ENCOUNTER — Other Ambulatory Visit: Payer: Self-pay

## 2022-09-23 ENCOUNTER — Encounter (HOSPITAL_BASED_OUTPATIENT_CLINIC_OR_DEPARTMENT_OTHER): Payer: Self-pay | Admitting: Family

## 2022-09-23 ENCOUNTER — Ambulatory Visit (INDEPENDENT_AMBULATORY_CARE_PROVIDER_SITE_OTHER): Payer: 59 | Admitting: Family

## 2022-09-23 ENCOUNTER — Other Ambulatory Visit: Payer: Self-pay | Admitting: Internal Medicine

## 2022-09-23 VITALS — BP 110/72 | HR 79 | Ht 64.0 in | Wt 303.0 lb

## 2022-09-23 DIAGNOSIS — I25118 Atherosclerotic heart disease of native coronary artery with other forms of angina pectoris: Secondary | ICD-10-CM

## 2022-09-23 DIAGNOSIS — I10 Essential (primary) hypertension: Secondary | ICD-10-CM

## 2022-09-23 DIAGNOSIS — R400 Somnolence: Secondary | ICD-10-CM

## 2022-09-23 DIAGNOSIS — I152 Hypertension secondary to endocrine disorders: Secondary | ICD-10-CM

## 2022-09-23 DIAGNOSIS — E785 Hyperlipidemia, unspecified: Secondary | ICD-10-CM

## 2022-09-23 DIAGNOSIS — I3139 Other pericardial effusion (noninflammatory): Secondary | ICD-10-CM | POA: Diagnosis not present

## 2022-09-23 DIAGNOSIS — E1165 Type 2 diabetes mellitus with hyperglycemia: Secondary | ICD-10-CM

## 2022-09-23 MED ORDER — LANTUS SOLOSTAR 100 UNIT/ML ~~LOC~~ SOPN
58.0000 [IU] | PEN_INJECTOR | Freq: Every day | SUBCUTANEOUS | 3 refills | Status: DC
Start: 2022-09-23 — End: 2023-02-02
  Filled 2022-09-23: qty 15, 25d supply, fill #0
  Filled 2022-10-24: qty 15, 25d supply, fill #1
  Filled 2022-11-26 (×2): qty 15, 25d supply, fill #2
  Filled 2022-12-20: qty 15, 25d supply, fill #3

## 2022-09-23 MED ORDER — AMLODIPINE BESYLATE 10 MG PO TABS
10.0000 mg | ORAL_TABLET | Freq: Every day | ORAL | 1 refills | Status: DC
Start: 2022-09-23 — End: 2023-10-25
  Filled 2022-09-23: qty 90, fill #0
  Filled 2023-01-10: qty 90, 90d supply, fill #0
  Filled 2023-06-02 – 2023-06-15 (×2): qty 90, 90d supply, fill #1

## 2022-09-23 MED ORDER — INSULIN LISPRO (1 UNIT DIAL) 100 UNIT/ML (KWIKPEN)
28.0000 [IU] | PEN_INJECTOR | Freq: Two times a day (BID) | SUBCUTANEOUS | 3 refills | Status: DC
  Filled 2022-09-23: qty 15, 27d supply, fill #0
  Filled 2022-11-09: qty 15, 27d supply, fill #1
  Filled 2022-12-19: qty 15, 27d supply, fill #2
  Filled 2023-02-02: qty 15, 27d supply, fill #3

## 2022-09-23 NOTE — Progress Notes (Signed)
Office Visit    Patient Name: Kimberly Frazier Date of Encounter: 09/23/2022  PCP:  Marcine Matar, MD   [facility]  Cardiologist:  Jodelle Red, MD  Advanced Practice Provider:  No care team member to display Electrophysiologist:  None     Chief Complaint    Kimberly Frazier is a 62 y.o. female presents today for hypertension  Past Medical History    Past Medical History:  Diagnosis Date   Adrenal adenoma, left    Arthritis    Cancer    CKD (chronic kidney disease), stage II    Coronary artery disease 07-28-2018 followed [facility])  currently due to no insurance   per cardiac cath 02-06-2015 (positive mild lateral ishcemia on stress test)--- dLAD 80%,  mLAD 40%,  mPDA 80% (small vessel),  ostial D1 70%,  CFx with lumial irregarlities-- medical management   Depression    Diabetic neuropathy    Diabetic retinopathy    NPDR OU   Headache    History of Bell's palsy 07/2011   per pt residual facial pain on left side occasionally   History of cancer of vagina 1999   per pt completed radiation and chemo   History of sepsis 12/30/2017   positive blood culter fro E.coli   Hyperlipidemia    Hypertension    Hypertensive retinopathy    OU   Hypokalemia    Insulin dependent type 2 diabetes mellitus, uncontrolled    followed by pcp---  A1c was 11.7 on 06-15-2018 in epic   Myocardial infarction    10 yrs ago?   Nocturia    Peripheral neuropathy    PMB (postmenopausal bleeding)    Wears dentures    upper   Wears glasses    Past Surgical History:  Procedure Laterality Date   BREAST BIOPSY  2012   benign   CARDIAC CATHETERIZATION N/A 02/06/2015   Procedure: Left Heart Cath and Coronary Angiography;  Surgeon: Laurey Morale, MD;  Location: [facility] INVASIVE CV LAB;  Service: Cardiovascular;  Laterality: N/A;   CATARACT EXTRACTION Bilateral OD: 03/07/19, OS: 02/28/19   Dr. Nile Riggs   CATARACT EXTRACTION, BILATERAL     COLONOSCOPY      normal exam in [place], [place] abour 10-12 years ago   EYE SURGERY Bilateral OD: 03/07/19, OS: 02/28/19   Cat Sx - Dr. Nile Riggs   HYSTEROSCOPY WITH D & C N/A 08/01/2018   Procedure: DILATATION AND CURETTAGE /HYSTEROSCOPY;  Surgeon: Catalina Antigua, MD;  Location: [facility];  Service: Gynecology;  Laterality: N/A;   ROBOTIC ASSISTED TOTAL HYSTERECTOMY WITH BILATERAL SALPINGO OOPHERECTOMY N/A 11/28/2018   Procedure: XI ROBOTIC ASSISTED TOTAL HYSTERECTOMY WITH BILATERAL SALPINGO OOPHORECTOMY;  Surgeon: Adolphus Birchwood, MD;  Location: [facility];  Service: Gynecology;  Laterality: N/A;   SENTINEL NODE BIOPSY N/A 11/28/2018   Procedure: SENTINEL NODE BIOPSY;  Surgeon: Adolphus Birchwood, MD;  Location: [facility];  Service: Gynecology;  Laterality: N/A;   UMBILICAL HERNIA REPAIR  child    Allergies  Allergies  Allergen Reactions   Jardiance [Empagliflozin] Other (See Comments)    Frequent yeast infection    History of Present Illness    Kimberly Frazier is a 62 y.o. female with a hx of CAD, hypertension, hyperlipidemia, DM2 last seen 12/09/2021 by Edd Fabian, NP.  At last visit 12/09/2021 BP well-controlled and carvedilol, and limiting, Imdur were continued.  Recommended to increase physical activity.  She was without anginal symptoms.  Recommended to continue  atorvastatin, Zetia.  She did lose stool and was recommended to follow with PCP if no improvement.  Presents today for follow-up independently. Enjoys spending time with her four grandchildren. Endorses feeling well since last seen. No formal exercise routine, mostly sedentary during the day. Exercise tolerance limited by back pain. Notes she wakes up feeling tired which "keeps me in the bed". She lives alone so is not sure if she snores. Does note daytime somnolence.   Reports no shortness of breath at rest and stable dyspnea on exertion. Reports no chest pain, pressure, or tightness. No edema, orthopnea, PND. Reports no palpitations.  No  lightheadedness nor dizziness.   EKGs/Labs/Other Studies Reviewed:   The following studies were reviewed today: Cardiac Studies & Procedures   CARDIAC CATHETERIZATION  CARDIAC CATHETERIZATION 02/06/2015  Narrative The patient has 80% distal LAD and 80% mid PDA stenoses.  The PDA is a relatively small vessel.  As Cardiolite was not high risk, I would plan initial medical management here.  I think PCI would be technically possible but given the distal location in the LAD and the relatively small caliber of the PDA, I think medical management initially is the best plan.  The RCA was difficult to engage, located anteriorly, multiple catheters attempted, able to engage with EZ rad right catheter.  Will hydrate aggressively post-procedure.  Findings Coronary Findings Diagnostic  Dominance: Right  Left Main Short, no significant disease.  Left Anterior Descending 40% mid LAD stenosis, 80% distal LAD stenosis. Small-moderate D1 with 70% ostial stenosis, 50% mid vessel stenosis.  Left Circumflex Large system with luminal irregularities.  Right Coronary Artery 80% mid PDA stenosis.  The PDA is a relatively small vessel.  Intervention  No interventions have been documented.   STRESS TESTS  MYOCARDIAL PERFUSION IMAGING 01/21/2015  Narrative  Nuclear stress EF: 60%.  There was no ST segment deviation noted during stress.  The left ventricular ejection fraction is normal (55-65%).  Low risk stress nuclear study with a small, moderate intensity, reversible lateral defect consistent with mild lateral ischemia; EF 60 with normal wall motion.   ECHOCARDIOGRAM  ECHOCARDIOGRAM COMPLETE 04/21/2020  Narrative ECHOCARDIOGRAM REPORT    Patient Name:   Kimberly Frazier  Date of Exam: 04/21/2020 Medical Rec #:  [id number]     Height:       64.0 in Accession #:    [id number]    Weight:       286.6 lb Date of Birth:  Jul 05, 1960     BSA:          2.280 m Patient Age:    59 years      BP:            140/90 mmHg Patient Gender: F             HR:           93 bpm. Exam Location:  [address]  Procedure: 2D Echo, Cardiac Doppler, Color Doppler and Intracardiac Opacification Agent  Indications:    R07.9 Chest pain  History:        Patient has prior history of Echocardiogram examinations, most recent 09/06/2018. Risk Factors:Hypertension, Diabetes, Dyslipidemia and Obesity. Coronary artery disease. Chronic kidney disease. Myocardial infarction.  Sonographer:    Daphine Deutscher RDCS Referring Phys: [id number] Kimberly Frazier  IMPRESSIONS   1. Left ventricular ejection fraction, by estimation, is 50 to 55%. The left ventricle has low normal function. The left ventricle demonstrates regional wall motion abnormalities (see scoring diagram/findings for description).  Left ventricular diastolic parameters are consistent with Grade I diastolic dysfunction (impaired relaxation). Elevated left atrial pressure. 2. Right ventricular systolic function is normal. The right ventricular size is normal. 3. Large pericardial effusion. The pericardial effusion is circumferential. There is no evidence of cardiac tamponade. Moderate pleural effusion in the left lateral region. 4. The mitral valve is normal in structure. Mild mitral valve regurgitation. No evidence of mitral stenosis. 5. The aortic valve is normal in structure. Aortic valve regurgitation is not visualized. No aortic stenosis is present. 6. Aortic dilatation noted. There is mild dilatation of the ascending aorta, measuring 40 mm. 7. The inferior vena cava is normal in size with greater than 50% respiratory variability, suggesting right atrial pressure of 3 mmHg.  Comparison(s): Unchanged pericardial effusion from 09/06/2018.  FINDINGS Left Ventricle: Left ventricular ejection fraction, by estimation, is 50 to 55%. The left ventricle has low normal function. The left ventricle demonstrates regional wall motion abnormalities.  Definity contrast agent was given IV to delineate the left ventricular endocardial borders. The left ventricular internal cavity size was normal in size. There is no left ventricular hypertrophy. Left ventricular diastolic parameters are consistent with Grade I diastolic dysfunction (impaired relaxation). Elevated left atrial pressure.   LV Wall Scoring: The entire apex is akinetic.  Right Ventricle: The right ventricular size is normal. No increase in right ventricular wall thickness. Right ventricular systolic function is normal.  Left Atrium: Left atrial size was normal in size.  Right Atrium: Right atrial size was normal in size.  Pericardium: A large pericardial effusion is present. The pericardial effusion is circumferential. There is no evidence of cardiac tamponade.  Mitral Valve: The mitral valve is normal in structure. Mild mitral valve regurgitation. No evidence of mitral valve stenosis.  Tricuspid Valve: The tricuspid valve is normal in structure. Tricuspid valve regurgitation is not demonstrated. No evidence of tricuspid stenosis.  Aortic Valve: The aortic valve is normal in structure. Aortic valve regurgitation is not visualized. No aortic stenosis is present. Aortic valve peak gradient measures 11.0 mmHg.  Pulmonic Valve: The pulmonic valve was normal in structure. Pulmonic valve regurgitation is not visualized. No evidence of pulmonic stenosis.  Aorta: The aortic root is normal in size and structure and aortic dilatation noted. There is mild dilatation of the ascending aorta, measuring 40 mm.  Venous: The inferior vena cava is normal in size with greater than 50% respiratory variability, suggesting right atrial pressure of 3 mmHg.  IAS/Shunts: No atrial level shunt detected by color flow Doppler.  Additional Comments: There is a moderate pleural effusion in the left lateral region.   LEFT VENTRICLE PLAX 2D LVIDd:         4.50 cm  Diastology LVIDs:         3.00 cm   LV e' medial:    4.24 cm/s LV PW:         1.20 cm  LV E/e' medial:  24.1 LV IVS:        1.30 cm  LV e' lateral:   4.03 cm/s LVOT diam:     2.00 cm  LV E/e' lateral: 25.3 LV SV:         85 LV SV Index:   37 LVOT Area:     3.14 cm   RIGHT VENTRICLE             IVC RV Basal diam:  3.30 cm     IVC diam: 1.40 cm RV S prime:     17.55  cm/s TAPSE (M-mode): 2.2 cm  LEFT ATRIUM             Index       RIGHT ATRIUM           Index LA diam:        4.90 cm 2.15 cm/m  RA Area:     11.30 cm LA Vol (A2C):   61.1 ml 26.80 ml/m RA Volume:   25.50 ml  11.19 ml/m LA Vol (A4C):   59.7 ml 26.19 ml/m LA Biplane Vol: 60.9 ml 26.72 ml/m AORTIC VALVE AV Area (Vmax): 2.25 cm AV Vmax:        166.00 cm/s AV Peak Grad:   11.0 mmHg LVOT Vmax:      119.00 cm/s LVOT Vmean:     94.000 cm/s LVOT VTI:       0.272 m  AORTA Ao Root diam: 3.40 cm Ao Asc diam:  4.00 cm  MITRAL VALVE MV Area (PHT): 2.87 cm     SHUNTS MV Decel Time: 264 msec     Systemic VTI:  0.27 m MV E velocity: 102.00 cm/s  Systemic Diam: 2.00 cm MV A velocity: 142.00 cm/s MV E/A ratio:  0.72  Kimberly Schultz MD Electronically signed by Kimberly Schultz MD Signature Date/Time: 04/21/2020/1:04:58 PM    Final     CT SCANS  CT CORONARY MORPH W/CTA COR W/SCORE 09/29/2019  Addendum 09/29/2019  8:50 AM ADDENDUM REPORT: 09/29/2019 08:47  EXAM: CT FFR ANALYSIS  CLINICAL DATA:  62 yo female with shortness of breath CAD; precordial pain  FINDINGS: FFRct analysis was performed on the original cardiac CT angiogram dataset. Diagrammatic representation of the FFRct analysis is provided in a separate PDF document in PACS. This dictation was created using the PDF document and an interactive 3D model of the results. 3D model is not available in the EMR/PACS. Normal FFR range is >0.80.  1. Left Main:  No significant stenosis. FFR = 1.00  2. LAD: No significant stenosis. Proximal FFR = 0.99, Mid FFR = 0.97, Distal FFR = Occluded 3. LCX:  No significant stenosis. Proximal FFR = 0.96, Distal FFR = 0.84 4. RCA: No significant stenosis. Proximal FFR = 1.00, Mid FFR = 0.95, Distal FFR = 0.89  IMPRESSION: 1. CT FFR analysis demonstrated an occluded distal LAD, but otherwise no flow-limiting stenosis.   Electronically Signed By: Kimberly Frazier M.D. On: 09/29/2019 08:47  Addendum 09/27/2019  7:37 PM ADDENDUM REPORT: 09/27/2019 19:35  HISTORY: 62 yo female with shortness of breath CAD; precordial pain  EXAM: Cardiac/Coronary CTA  TECHNIQUE: The patient was scanned on a [employer].  PROTOCOL: A 120 kV prospective scan was triggered in the descending thoracic aorta at 111 HU's. Axial non-contrast 3 mm slices were carried out through the heart. The data set was analyzed on a dedicated work station and scored using the Agatson method. Gantry rotation speed was 250 msecs and collimation was .6 mm. Beta blockade and 0.8 mg of sl NTG was given. The 3D data set was reconstructed in 5% intervals of the 67-82 % of the R-R cycle. Diastolic phases were analyzed on a dedicated work station using MPR, MIP and VRT modes. The patient received OMNIPAQUE IOHEXOL 350 MG/ML SOLN of contrast.  FINDINGS: Quality: Good (HR 65)  Coronary calcium score: The patient's coronary artery calcium score is 350, which places the patient in the 97th percentile.  Coronary arteries: Normal coronary origins.  Right dominance.  Right Coronary Artery: Dominant. Mild  25-49% non-calcified plaque of the mid-vessel (CADRADS2).  Left Main Coronary Artery: Eccentric calcification with minimal 1-24% mixed stenosis (CADRADS1). Bifurcates into the LAD and LCx vessels.  Left Anterior Descending Coronary Artery: The LAD does not reach the apex and may be occluded in the distal portion. There is moderate 50-69% (CADRADS3) mid-vessel non-calcified stenosis.  Left Circumflex Artery: Minimal distal 1-24% diffuse non-calcified stenosis.  Smaller distal OM1 branch has calcified plaque with mild to moderate stenosis.  Aorta: Normal size, 37 mm at the mid ascending aorta (level of the PA bifurcation) measured double oblique. No calcifications. No dissection.  Aortic Valve: Trileaflet.  No calcifications.  Other findings:  Normal pulmonary vein drainage into the left atrium.  Normal left atrial appendage without a thrombus.  Dilated main pulmonary artery measuring 32 mm, suggestive of pulmonary hypertension.  Moderate sized circumferential pericardial effusion.  IMPRESSION: 1. Moderate non-calcified mid-LAD stenosis, possible distal LAD occlusion, CADRADS = 3. CT FFR will be performed and reported separately.  2. Coronary calcium score of 350. This was 97th percentile for age and sex matched control.  3. Normal coronary origin with right dominance.  4.  Moderate-sized circumferential pericardial effusion  5. Dilated main pulmonary artery to 32 mm, suggestive of pulmonary hypertension  6.  See radiology findings regarding lung parenchymal changes   Electronically Signed By: Kimberly NoseD. On: 09/27/2019 19:35  Narrative EXAM: OVER-READ INTERPRETATION  CT CHEST  The following report is an over-read performed by radiologist Dr. Trudie Frazier of [facility] on 09/27/2019. This over-read does not include interpretation of cardiac or coronary anatomy or pathology. The coronary calcium score/coronary CTA interpretation by the cardiologist is attached.  COMPARISON:  None.  FINDINGS: Moderate pericardial effusion. Patchy areas of peribronchovascular predominant ground-glass attenuation, septal thickening and regional architectural distortion, most evident in the medial aspect of the right lower lobe and inferior segment of the lingula. Within the visualized portions of the thorax there are no suspicious appearing pulmonary nodules or masses, there is no acute consolidative airspace  disease, no pleural effusions, no pneumothorax and no lymphadenopathy. Visualized portions of the upper abdomen are unremarkable. There are no aggressive appearing lytic or blastic lesions noted in the visualized portions of the skeleton.  IMPRESSION: 1. Moderate pericardial effusion. 2. Patchy findings in the lungs concerning for multilobar bronchopneumonia, as above.  Electronically Signed: By: Kimberly Frazier M.D. On: 09/27/2019 14:43   CT SCANS  CT CORONARY MORPH W/CTA COR W/SCORE 09/14/2018  Addendum 09/14/2018  2:12 PM ADDENDUM REPORT: 09/14/2018 14:10  CLINICAL DATA:  62 year old female with known CAD (80% distal LAD and 80% mid PDA stenoses on cath in 2016 not amenable to intervention/medical management), now with new chest pain and planned hysterectomy for endometrial cancer.  EXAM: Cardiac/Coronary  CT  TECHNIQUE: The patient was scanned on a [employer].  FINDINGS: A 120 kV prospective scan was triggered in the descending thoracic aorta at 111 HU's. Axial non-contrast 3 mm slices were carried out through the heart. The data set was analyzed on a dedicated work station and scored using the Agatson method. Gantry rotation speed was 250 msecs and collimation was .6 mm. No beta blockade and 0.8 mg of sl NTG was given. The 3D data set was reconstructed in 5% intervals of the 67-82 % of the R-R cycle. Diastolic phases were analyzed on a dedicated work station using MPR, MIP and VRT modes. The patient received 80 cc of contrast.  Aorta: Upper normal size with maximum diameter  of the ascending aorta measuring 39 mm. Minimal diffuse calcifications. No dissection.  Aortic Valve:  Trileaflet.  No calcifications.  Coronary Arteries: Normal coronary origin. Right and left co-dominance.  RCA is a small lumen artery that gives rise to PDA. There s mild plaque in the proximal RCA and severe > 70% stenosis in the distal RCA. PDA is small and poorly  visualized.  Left main is a large caliber, short artery that gives rise to LAD and LCX arteries. There is minimal eccentric calcified plaque with stenosis 0-25%.  LAD is a large vessel that gives rise to two diagonal arteries. There is minimal calcified plaque with stenosis 0-25% in the proximal segment. Mid LAD has severe non-calcified plaque with stenosis > 70%. Mid to distal LAD is occluded. 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  D1 is a small branches with at least moderate ostial non-calcified plaque.  D2 is very small with diffuse plaque.  LCX is a medium size that gives rise to one small OM1 branch. There is mild plaque in the proximal and mid segment, distal LCX/PLA have mild diffuse plaque.  OM1 is a small branch with severe stenosis in the mid portion.  Other findings:  Normal pulmonary vein drainage into the left atrium.  Normal let atrial appendage without a thrombus.  Normal size of the pulmonary artery.  IMPRESSION: 1. Coronary calcium score of 258. This was 82 percentile for age and sex matched control.  2. Normal coronary origin with right dominance.  3. Severe stenosis in a small distal RCA previously not amenable to intervention. Severe stenosis in the distal LAD and occluded mid to distal LAD - chronic occlusion given apical aneurysm (no thrombus within the aneurysm).  At least moderate stenoses in the ostial portion of the first diagonal artery and mid portion of OM1.  4. Unchanged mild to moderate circumferential pericardial effusion (when compared to the chest CTA from 08/17/2018).  5. No evidence for an intracardiac mass.  Additional analysis with CT FFR will be submitted.   Electronically Signed By: Kimberly Frazier On: 09/14/2018 14:10  Narrative EXAM: OVER-READ INTERPRETATION  CT CHEST  The following report is an over-read performed by radiologist Dr. Trudie Frazier of [facility] on 09/13/2018. This over-read does not  include interpretation of cardiac or coronary anatomy or pathology. The coronary calcium score/coronary CTA interpretation by the cardiologist is attached.  COMPARISON:  None.  FINDINGS: Within the visualized portions of the thorax there are no suspicious appearing pulmonary nodules or masses, there is no acute consolidative airspace disease, no pleural effusions, no pneumothorax and no lymphadenopathy. Visualized portions of the upper abdomen are unremarkable. There are no aggressive appearing lytic or blastic lesions noted in the visualized portions of the skeleton.  IMPRESSION: No significant incidental noncardiac findings are noted.  Electronically Signed: By: Kimberly Frazier M.D. On: 09/13/2018 11:05           EKG:  EKG is  ordered today.  The ekg ordered today demonstrates NSR 79 bpm no acute ST/T wave changes.   Recent Labs: 08/06/2022: ALT 18; BUN 14; Creatinine, Ser 1.12; Hemoglobin 13.4; Platelets 257; Potassium 3.8; Sodium 144  Recent Lipid Panel    Component Value Date/Time   CHOL 126 08/06/2022 1125   TRIG 110 08/06/2022 1125   HDL 44 08/06/2022 1125   CHOLHDL 2.9 08/06/2022 1125   CHOLHDL 5 07/15/2015 1513   VLDL 44.0 (H) 07/15/2015 1513   LDLCALC 62 08/06/2022 1125   LDLDIRECT 93.0 07/15/2015 1513  Home Medications   Current Meds  Medication Sig   Accu-Chek Softclix Lancets lancets Use to check blood sugar 3 times daily.   amLODipine (NORVASC) 10 MG tablet Take 1 tablet (10 mg total) by mouth daily.   aspirin EC 81 MG tablet Take 1 tablet (81 mg total) daily by mouth.   atorvastatin (LIPITOR) 80 MG tablet Take 1 tablet (80 mg total) by mouth once daily. (PLEASE SCHEDULE AN APPT FOR FUTURE REFILLS)   Blood Glucose Monitoring Suppl (ACCU-CHEK GUIDE) w/Device KIT 1 kit by Does not apply route in the morning, at noon, and at bedtime. Use to check blood sugar 3 times daily.   brimonidine (ALPHAGAN) 0.2 % ophthalmic solution Place 1 drop into both eyes 2  (two) times daily.   carvedilol (COREG) 25 MG tablet TAKE 1 TABLET (25 MG TOTAL) BY MOUTH 2 (TWO) TIMES DAILY WITH A MEAL.   Continuous Blood Gluc Receiver (FREESTYLE LIBRE 2 READER) DEVI Use as directed   Continuous Blood Gluc Sensor (FREESTYLE LIBRE SENSOR SYSTEM) MISC Change sensor every 2 weeks   diclofenac Sodium (VOLTAREN) 1 % GEL Apply 2 g topically 2 (two) times daily as needed.   dorzolamide-timolol (COSOPT) 2-0.5 % ophthalmic solution Place 1 drop into both eyes 2 (two) times daily.   ezetimibe (ZETIA) 10 MG tablet Take 1 tablet (10 mg total) by mouth daily.   glucose blood (ACCU-CHEK GUIDE) test strip Use to check blood sugar 3 times daily.   insulin glargine (LANTUS SOLOSTAR) 100 UNIT/ML Solostar Pen Inject 58 Units into the skin daily.   insulin lispro (HUMALOG KWIKPEN) 100 UNIT/ML KwikPen Inject 28 Units into the skin in the morning and at bedtime.   Insulin Pen Needle (BD PEN NEEDLE NANO U/F) 32G X 4 MM MISC Use as directed.   Insulin Syringe-Needle U-100 (BD VEO INSULIN SYRINGE U/F) 31G X 15/64" 0.3 ML MISC USE TO INJECT NOVOLOG THREE TIMES DAILY   isosorbide mononitrate (IMDUR) 60 MG 24 hr tablet TAKE 1 TABLET (60 MG TOTAL) BY MOUTH DAILY.   ketorolac (ACULAR) 0.5 % ophthalmic solution Place 1 drop into both eyes 4 (four) times daily.   Na Sulfate-K Sulfate-Mg Sulf 17.5-3.13-1.6 GM/177ML SOLN Suprep (no substitutions)-TAKE AS DIRECTED.   nitroGLYCERIN (NITROSTAT) 0.4 MG SL tablet Place 1 tab under tongue for chest pain.  May repeat after 5 minutes x 2.  DO NOT TAKE MORE THAN 3 TABS DURING AN EPISODE OF CHEST PAIN   potassium chloride (KLOR-CON) 10 MEQ tablet Take 2 tablets (20 mEq total) by mouth daily.   prednisoLONE acetate (PRED FORTE) 1 % ophthalmic suspension Place 1 drop into both eyes daily.   Semaglutide,0.25 or 0.5MG /DOS, 2 MG/3ML SOPN Inject 0.5 mg into the skin once a week.     Review of Systems      All other systems reviewed and are otherwise negative except as  noted above.  Physical Exam    VS:  BP 110/72   Pulse 79   Ht 5\' 4"  (1.626 m)   Wt (!) 303 lb (137.4 kg)   BMI 52.01 kg/m  , BMI Body mass index is 52.01 kg/m.  Wt Readings from Last 3 Encounters:  09/23/22 (!) 303 lb (137.4 kg)  08/06/22 (!) 303 lb (137.4 kg)  02/08/22 (!) 304 lb 3.2 oz (138 kg)     GEN: Well nourished, overweight, well developed, in no acute distress. HEENT: normal. Neck: Supple, no JVD, carotid bruits, or masses. Cardiac: RRR, no murmurs, rubs, or gallops. No  clubbing, cyanosis, edema.  Radials/PT 2+ and equal bilaterally.  Respiratory:  Respirations regular and unlabored, clear to auscultation bilaterally. GI: Soft, nontender, nondistended. MS: No deformity or atrophy. Skin: Warm and dry, no rash. Neuro:  Strength and sensation are intact. Psych: Normal affect.  Assessment & Plan    Hypertension- BP well controlled. Continue current antihypertensive regimen.  Discussed to monitor BP at home at least 2 hours after medications and sitting for 5-10 minutes.   Daytime somnolence - STOP Bang 5. Plan for home sleep study.   CAD/hyperlipidemia, LDL goal less than 68-coronary CTA 09/2019 RCA 25 to 49%, LAD 1-24% and 50 to 69%, LCx 1-24% stenosis.  08/06/22 LDL 62. Continue Atorvastatin 80mg  QD.  Refer to PREP exercise program at the [facility].  Pericardial effusion-echo 03/2020 large pericardial effusion with no evidence of tamponade.  Prior ESR normal and CRP elevated.  No exertional chest pain, no indication for further workup.  [id number] - Careful titration of diuretic and antihypertensive.    DM2 - Continue to follow with PCP.          Disposition: Follow up  in 4-6 months  with Jodelle Red, MD or APP.  Signed, Alver Sorrow, NP 09/23/2022, 3:17 PM [facility]

## 2022-09-23 NOTE — Patient Instructions (Addendum)
Medication Instructions:  Continue your current medications.   *If you need a refill on your cardiac medications before your next appointment, please call your pharmacy*   Lab Work: Your cholesterol in March looked fantastic!  Testing/Procedures: Your EKG today showed normal sinus rhythm which is a good result!  Your physician has recommended that you have a sleep study. This test records several body functions during sleep, including: brain activity, eye movement, oxygen and carbon dioxide blood levels, heart rate and rhythm, breathing rate and rhythm, the flow of air through your mouth and nose, snoring, body muscle movements, and chest and belly movement.   Follow-Up: At [facility], you and your health needs are our priority.  As part of our continuing mission to provide you with exceptional heart care, we have created designated Provider Care Teams.  These Care Teams include your primary Cardiologist (physician) and Advanced Practice Providers (APPs -  Physician Assistants and Nurse Practitioners) who all work together to provide you with the care you need, when you need it.  We recommend signing up for the patient portal called "MyChart".  Sign up information is provided on this After Visit Summary.  MyChart is used to connect with patients for Virtual Visits (Telemedicine).  Patients are able to view lab/test results, encounter notes, upcoming appointments, etc.  Non-urgent messages can be sent to your provider as well.   To learn more about what you can do with MyChart, go to [email or web address].    Your next appointment:   4-6 month(s)  Provider:   Jodelle Red, MD or Gillian Shields, NP    Other Instructions  Heart Healthy Diet Recommendations: A low-salt diet is recommended. Meats should be grilled, baked, or boiled. Avoid fried foods. Focus on lean protein sources like fish or chicken with vegetables and fruits. The American Heart Association is a Personal assistant!  American Heart Association Diet and Lifeystyle Recommendations   Exercise recommendations: The American Heart Association recommends 150 minutes of moderate intensity exercise weekly. Try 30 minutes of moderate intensity exercise 4-5 times per week. This could include walking, jogging, or swimming.

## 2022-09-24 ENCOUNTER — Telehealth: Payer: Self-pay

## 2022-09-24 NOTE — Telephone Encounter (Signed)
Call to pt reference referral to PREP Described program to pt hours available.  Pt would prefer the [id number] class. Prefers [employer] location.  Okay with starting July when next class starts. Will call her closer to the start of class to do intake appt for goals and measurements.  Given my phone number for call back

## 2022-10-22 ENCOUNTER — Other Ambulatory Visit: Payer: Self-pay | Admitting: Internal Medicine

## 2022-10-22 ENCOUNTER — Telehealth (HOSPITAL_BASED_OUTPATIENT_CLINIC_OR_DEPARTMENT_OTHER): Payer: Self-pay

## 2022-10-22 DIAGNOSIS — E876 Hypokalemia: Secondary | ICD-10-CM

## 2022-10-22 NOTE — Telephone Encounter (Signed)
Home Sleep Study ordered 09/23/22, yet to be scheduled, Sleep team can you look into this?

## 2022-10-22 NOTE — Telephone Encounter (Signed)
Requested Prescriptions  Pending Prescriptions Disp Refills   potassium chloride (KLOR-CON) 10 MEQ tablet [Pharmacy Med Name: POTASSIUM CL ER 10 MEQ TABLET] 180 tablet 3    Sig: TAKE 2 TABLETS BY MOUTH DAILY     Endocrinology:  Minerals - Potassium Supplementation Failed - 10/22/2022 12:09 AM      Failed - Cr in normal range and within 360 days    Creatinine, Ser  Date Value Ref Range Status  08/06/2022 1.12 (H) 0.57 - 1.00 mg/dL Final   Creatinine,U  Date Value Ref Range Status  07/15/2015 107.0 mg/dL Final   Creatinine, Urine  Date Value Ref Range Status  01/01/2018 52.71 mg/dL Final         Passed - K in normal range and within 360 days    Potassium  Date Value Ref Range Status  08/06/2022 3.8 3.5 - 5.2 mmol/L Final         Passed - Valid encounter within last 12 months    Recent Outpatient Visits           2 months ago Type 2 diabetes mellitus with morbid obesity (HCC)   [place] [facility] Jonah Blue B, MD   8 months ago Type 2 diabetes mellitus with morbid obesity [facility])   [place] [facility] Jonah Blue B, MD   9 months ago Type 2 diabetes mellitus with morbid obesity [facility])   [place] [facility] Marcine Matar, MD   9 months ago Erroneous encounter - disregard   [place] [facility] Jonah Blue B, MD   1 year ago Type 2 diabetes mellitus with morbid obesity [facility])   [place] [facility] Marcine Matar, MD       Future Appointments             In 3 months Dan Humphreys, Storm Frisk, NP  Heart & Vascular at [facility], [place]

## 2022-10-27 ENCOUNTER — Encounter (INDEPENDENT_AMBULATORY_CARE_PROVIDER_SITE_OTHER): Payer: 59 | Admitting: Ophthalmology

## 2022-10-27 DIAGNOSIS — I10 Essential (primary) hypertension: Secondary | ICD-10-CM

## 2022-10-27 DIAGNOSIS — H40053 Ocular hypertension, bilateral: Secondary | ICD-10-CM

## 2022-10-27 DIAGNOSIS — H04123 Dry eye syndrome of bilateral lacrimal glands: Secondary | ICD-10-CM

## 2022-10-27 DIAGNOSIS — H35033 Hypertensive retinopathy, bilateral: Secondary | ICD-10-CM

## 2022-10-27 DIAGNOSIS — E113313 Type 2 diabetes mellitus with moderate nonproliferative diabetic retinopathy with macular edema, bilateral: Secondary | ICD-10-CM

## 2022-10-27 DIAGNOSIS — Z961 Presence of intraocular lens: Secondary | ICD-10-CM

## 2022-10-27 DIAGNOSIS — H35353 Cystoid macular degeneration, bilateral: Secondary | ICD-10-CM

## 2022-10-28 ENCOUNTER — Other Ambulatory Visit: Payer: Self-pay

## 2022-11-08 NOTE — Patient Instructions (Incomplete)
Kimberly Frazier , Thank you for taking time to come for your Medicare Wellness Visit. I appreciate your ongoing commitment to your health goals. Please review the following plan we discussed and let me know if I can assist you in the future.   These are the goals we discussed:  Goals      Weight (lb) < 200 lb (90.7 kg)     "I would like to lose about 40 lbs by the end of the year."          This is a list of the screening recommended for you and due dates:  Health Maintenance  Topic Date Due   COVID-19 Vaccine (3 - Pfizer risk series) 03/20/2020   DTaP/Tdap/Td vaccine (2 - Td or Tdap) 07/26/2022   Eye exam for diabetics  09/17/2022   Medicare Annual Wellness Visit  10/23/2022   Zoster (Shingles) Vaccine (2 of 2) 08/07/2023*   Mammogram  11/18/2022   Flu Shot  12/30/2022   Hemoglobin A1C  02/06/2023   Complete foot exam   02/09/2023   Yearly kidney function blood test for diabetes  08/06/2023   Yearly kidney health urinalysis for diabetes  08/06/2023   Hepatitis C Screening  Completed   HIV Screening  Completed   HPV Vaccine  Aged Out   Pap Smear  Discontinued   Cologuard (Stool DNA test)  Discontinued  *Topic was postponed. The date shown is not the original due date.    Advanced directives: Information on Advanced Care Planning can be found at [facility] Advance Health Care Directives Advance Health Care Directives ([email or web address] Please bring a copy of your health care power of attorney and living will to the office to be added to your chart at your convenience.   Conditions/risks identified: Aim for 30 minutes of exercise or brisk walking, 6-8 glasses of water, and 5 servings of fruits and vegetables each day.  Next appointment: Follow up in one year for your annual wellness visit.   Preventive Care 40-64 Years, Female Preventive care refers to lifestyle choices and visits with your health care provider that can promote health and wellness. What does  preventive care include? A yearly physical exam. This is also called an annual well check. Dental exams once or twice a year. Routine eye exams. Ask your health care provider how often you should have your eyes checked. Personal lifestyle choices, including: Daily care of your teeth and gums. Regular physical activity. Eating a healthy diet. Avoiding tobacco and drug use. Limiting alcohol use. Practicing safe sex. Taking low-dose aspirin daily starting at age 84. Taking vitamin and mineral supplements as recommended by your health care provider. What happens during an annual well check? The services and screenings done by your health care provider during your annual well check will depend on your age, overall health, lifestyle risk factors, and family history of disease. Counseling  Your health care provider may ask you questions about your: Alcohol use. Tobacco use. Drug use. Emotional well-being. Home and relationship well-being. Sexual activity. Eating habits. Work and work Astronomer. Method of birth control. Menstrual cycle. Pregnancy history. Screening  You may have the following tests or measurements: Height, weight, and BMI. Blood pressure. Lipid and cholesterol levels. These may be checked every 5 years, or more frequently if you are over 38 years old. Skin check. Lung cancer screening. You may have this screening every year starting at age 34 if you have a 30-pack-year history of smoking and currently smoke  or have quit within the past 15 years. Fecal occult blood test (FOBT) of the stool. You may have this test every year starting at age 83. Flexible sigmoidoscopy or colonoscopy. You may have a sigmoidoscopy every 5 years or a colonoscopy every 10 years starting at age 109. Hepatitis C blood test. Hepatitis B blood test. Sexually transmitted disease (STD) testing. Diabetes screening. This is done by checking your blood sugar (glucose) after you have not eaten for a  while (fasting). You may have this done every 1-3 years. Mammogram. This may be done every 1-2 years. Talk to your health care provider about when you should start having regular mammograms. This may depend on whether you have a family history of breast cancer. BRCA-related cancer screening. This may be done if you have a family history of breast, ovarian, tubal, or peritoneal cancers. Pelvic exam and Pap test. This may be done every 3 years starting at age 12. Starting at age 77, this may be done every 5 years if you have a Pap test in combination with an HPV test. Bone density scan. This is done to screen for osteoporosis. You may have this scan if you are at high risk for osteoporosis. Discuss your test results, treatment options, and if necessary, the need for more tests with your health care provider. Vaccines  Your health care provider may recommend certain vaccines, such as: Influenza vaccine. This is recommended every year. Tetanus, diphtheria, and acellular pertussis (Tdap, Td) vaccine. You may need a Td booster every 10 years. Zoster vaccine. You may need this after age 45. Pneumococcal 13-valent conjugate (PCV13) vaccine. You may need this if you have certain conditions and were not previously vaccinated. Pneumococcal polysaccharide (PPSV23) vaccine. You may need one or two doses if you smoke cigarettes or if you have certain conditions. Talk to your health care provider about which screenings and vaccines you need and how often you need them. This information is not intended to replace advice given to you by your health care provider. Make sure you discuss any questions you have with your health care provider. Document Released: 06/13/2015 Document Revised: 02/04/2016 Document Reviewed: 03/18/2015 Elsevier Interactive Patient Education  2017 [employer].    Fall Prevention in the Home Falls can cause injuries. They can happen to people of all ages. There are many things you can do  to make your home safe and to help prevent falls. What can I do on the outside of my home? Regularly fix the edges of walkways and driveways and fix any cracks. Remove anything that might make you trip as you walk through a door, such as a raised step or threshold. Trim any bushes or trees on the path to your home. Use bright outdoor lighting. Clear any walking paths of anything that might make someone trip, such as rocks or tools. Regularly check to see if handrails are loose or broken. Make sure that both sides of any steps have handrails. Any raised decks and porches should have guardrails on the edges. Have any leaves, snow, or ice cleared regularly. Use sand or salt on walking paths during winter. Clean up any spills in your garage right away. This includes oil or grease spills. What can I do in the bathroom? Use night lights. Install grab bars by the toilet and in the tub and shower. Do not use towel bars as grab bars. Use non-skid mats or decals in the tub or shower. If you need to sit down in the shower,  use a plastic, non-slip stool. Keep the floor dry. Clean up any water that spills on the floor as soon as it happens. Remove soap buildup in the tub or shower regularly. Attach bath mats securely with double-sided non-slip rug tape. Do not have throw rugs and other things on the floor that can make you trip. What can I do in the bedroom? Use night lights. Make sure that you have a light by your bed that is easy to reach. Do not use any sheets or blankets that are too big for your bed. They should not hang down onto the floor. Have a firm chair that has side arms. You can use this for support while you get dressed. Do not have throw rugs and other things on the floor that can make you trip. What can I do in the kitchen? Clean up any spills right away. Avoid walking on wet floors. Keep items that you use a lot in easy-to-reach places. If you need to reach something above you, use  a strong step stool that has a grab bar. Keep electrical cords out of the way. Do not use floor polish or wax that makes floors slippery. If you must use wax, use non-skid floor wax. Do not have throw rugs and other things on the floor that can make you trip. What can I do with my stairs? Do not leave any items on the stairs. Make sure that there are handrails on both sides of the stairs and use them. Fix handrails that are broken or loose. Make sure that handrails are as long as the stairways. Check any carpeting to make sure that it is firmly attached to the stairs. Fix any carpet that is loose or worn. Avoid having throw rugs at the top or bottom of the stairs. If you do have throw rugs, attach them to the floor with carpet tape. Make sure that you have a light switch at the top of the stairs and the bottom of the stairs. If you do not have them, ask someone to add them for you. What else can I do to help prevent falls? Wear shoes that: Do not have high heels. Have rubber bottoms. Are comfortable and fit you well. Are closed at the toe. Do not wear sandals. If you use a stepladder: Make sure that it is fully opened. Do not climb a closed stepladder. Make sure that both sides of the stepladder are locked into place. Ask someone to hold it for you, if possible. Clearly mark and make sure that you can see: Any grab bars or handrails. First and last steps. Where the edge of each step is. Use tools that help you move around (mobility aids) if they are needed. These include: Canes. Walkers. Scooters. Crutches. Turn on the lights when you go into a dark area. Replace any light bulbs as soon as they burn out. Set up your furniture so you have a clear path. Avoid moving your furniture around. If any of your floors are uneven, fix them. If there are any pets around you, be aware of where they are. Review your medicines with your doctor. Some medicines can make you feel dizzy. This can  increase your chance of falling. Ask your doctor what other things that you can do to help prevent falls. This information is not intended to replace advice given to you by your health care provider. Make sure you discuss any questions you have with your health care provider. Document Released: 03/13/2009 Document  Revised: 10/23/2015 Document Reviewed: 06/21/2014 Elsevier Interactive Patient Education  2017 [employer].

## 2022-11-08 NOTE — Progress Notes (Unsigned)
Subjective:   Kimberly Frazier is a 62 y.o. female who presents for Medicare Annual (Subsequent) preventive examination.  Review of Systems    ***       Objective:    There were no vitals filed for this visit. There is no height or weight on file to calculate BMI.     10/22/2021   11:55 AM 09/16/2020   10:35 AM 06/19/2019    2:45 PM 11/29/2018    8:00 AM 11/28/2018    3:49 PM 11/24/2018   11:50 AM 10/20/2018   12:37 PM  Advanced Directives  Does Patient Have a Medical Advance Directive? No No No No No No No  Does patient want to make changes to medical advance directive?  Yes (Inpatient - patient defers changing a medical advance directive at this time - Information given)       Would patient like information on creating a medical advance directive?  Yes (Inpatient - patient defers creating a medical advance directive at this time - Information given) Yes (MAU/Ambulatory/Procedural Areas - Information given) No - Patient declined   Yes (MAU/Ambulatory/Procedural Areas - Information given)    Current Medications (verified) Outpatient Encounter Medications as of 11/09/2022  Medication Sig   Accu-Chek Softclix Lancets lancets Use to check blood sugar 3 times daily.   amLODipine (NORVASC) 10 MG tablet Take 1 tablet (10 mg total) by mouth daily.   aspirin EC 81 MG tablet Take 1 tablet (81 mg total) daily by mouth.   atorvastatin (LIPITOR) 80 MG tablet Take 1 tablet (80 mg total) by mouth once daily. (PLEASE SCHEDULE AN APPT FOR FUTURE REFILLS)   Blood Glucose Monitoring Suppl (ACCU-CHEK GUIDE) w/Device KIT 1 kit by Does not apply route in the morning, at noon, and at bedtime. Use to check blood sugar 3 times daily.   brimonidine (ALPHAGAN) 0.2 % ophthalmic solution Place 1 drop into both eyes 2 (two) times daily.   carvedilol (COREG) 25 MG tablet TAKE 1 TABLET (25 MG TOTAL) BY MOUTH 2 (TWO) TIMES DAILY WITH A MEAL.   Continuous Blood Gluc Receiver (FREESTYLE LIBRE 2 READER) DEVI Use as  directed   Continuous Blood Gluc Sensor (FREESTYLE LIBRE SENSOR SYSTEM) MISC Change sensor every 2 weeks   diclofenac Sodium (VOLTAREN) 1 % GEL Apply 2 g topically 2 (two) times daily as needed.   dorzolamide-timolol (COSOPT) 2-0.5 % ophthalmic solution Place 1 drop into both eyes 2 (two) times daily.   ezetimibe (ZETIA) 10 MG tablet Take 1 tablet (10 mg total) by mouth daily.   glucose blood (ACCU-CHEK GUIDE) test strip Use to check blood sugar 3 times daily.   insulin glargine (LANTUS SOLOSTAR) 100 UNIT/ML Solostar Pen Inject 58 Units into the skin daily.   insulin lispro (HUMALOG KWIKPEN) 100 UNIT/ML KwikPen Inject 28 Units into the skin in the morning and at bedtime.   Insulin Pen Needle (BD PEN NEEDLE NANO U/F) 32G X 4 MM MISC Use as directed.   isosorbide mononitrate (IMDUR) 60 MG 24 hr tablet TAKE 1 TABLET (60 MG TOTAL) BY MOUTH DAILY.   ketorolac (ACULAR) 0.5 % ophthalmic solution Place 1 drop into both eyes 4 (four) times daily.   Na Sulfate-K Sulfate-Mg Sulf 17.5-3.13-1.6 GM/177ML SOLN Suprep (no substitutions)-TAKE AS DIRECTED.   nitroGLYCERIN (NITROSTAT) 0.4 MG SL tablet Place 1 tab under tongue for chest pain.  May repeat after 5 minutes x 2.  DO NOT TAKE MORE THAN 3 TABS DURING AN EPISODE OF CHEST PAIN   potassium  chloride (KLOR-CON) 10 MEQ tablet TAKE 2 TABLETS BY MOUTH DAILY   prednisoLONE acetate (PRED FORTE) 1 % ophthalmic suspension Place 1 drop into both eyes daily.   Semaglutide,0.25 or 0.5MG /DOS, 2 MG/3ML SOPN Inject 0.5 mg into the skin once a week.   No facility-administered encounter medications on file as of 11/09/2022.    Allergies (verified) Jardiance [empagliflozin]   History: Past Medical History:  Diagnosis Date   Adrenal adenoma, left    Arthritis    Cancer (HCC)    CKD (chronic kidney disease), stage II    Coronary artery disease 07-28-2018 followed by pcp([facility])  currently due to no insurance   per cardiac cath 02-06-2015 (positive mild  lateral ishcemia on stress test)--- dLAD 80%,  mLAD 40%,  mPDA 80% (small vessel),  ostial D1 70%,  CFx with lumial irregarlities-- medical management   Depression    Diabetic neuropathy (HCC)    Diabetic retinopathy (HCC)    NPDR OU   Headache    History of Bell's palsy 07/2011   per pt residual facial pain on left side occasionally   History of cancer of vagina 1999   per pt completed radiation and chemo   History of sepsis 12/30/2017   positive blood culter fro E.coli   Hyperlipidemia    Hypertension    Hypertensive retinopathy    OU   Hypokalemia    Insulin dependent type 2 diabetes mellitus, uncontrolled    followed by pcp---  A1c was 11.7 on 06-15-2018 in epic   Myocardial infarction [facility])    10 yrs ago?   Nocturia    Peripheral neuropathy    PMB (postmenopausal bleeding)    Wears dentures    upper   Wears glasses    Past Surgical History:  Procedure Laterality Date   BREAST BIOPSY  2012   benign   CARDIAC CATHETERIZATION N/A 02/06/2015   Procedure: Left Heart Cath and Coronary Angiography;  Surgeon: Laurey Morale, MD;  Location: [facility] INVASIVE CV LAB;  Service: Cardiovascular;  Laterality: N/A;   CATARACT EXTRACTION Bilateral OD: 03/07/19, OS: 02/28/19   Dr. Nile Riggs   CATARACT EXTRACTION, BILATERAL     COLONOSCOPY     normal exam in [place], [place] abour 10-12 years ago   EYE SURGERY Bilateral OD: 03/07/19, OS: 02/28/19   Cat Sx - Dr. Nile Riggs   HYSTEROSCOPY WITH D & C N/A 08/01/2018   Procedure: DILATATION AND CURETTAGE /HYSTEROSCOPY;  Surgeon: Catalina Antigua, MD;  Location: [facility];  Service: Gynecology;  Laterality: N/A;   ROBOTIC ASSISTED TOTAL HYSTERECTOMY WITH BILATERAL SALPINGO OOPHERECTOMY N/A 11/28/2018   Procedure: XI ROBOTIC ASSISTED TOTAL HYSTERECTOMY WITH BILATERAL SALPINGO OOPHORECTOMY;  Surgeon: Adolphus Birchwood, MD;  Location: [facility];  Service: Gynecology;  Laterality: N/A;   SENTINEL NODE BIOPSY N/A 11/28/2018   Procedure: SENTINEL NODE  BIOPSY;  Surgeon: Adolphus Birchwood, MD;  Location: [facility];  Service: Gynecology;  Laterality: N/A;   UMBILICAL HERNIA REPAIR  child   Family History  Problem Relation Age of Onset   Heart disease Mother    Hypertension Mother    Diabetes Mother    Cancer Father        hodgkins lymphoma   Heart disease Sister        heart attack   Diabetes Sister    Colon cancer Brother 58   Diabetes Maternal Aunt    Diabetes Maternal Uncle    Cancer Other        parent  Diabetes Other        parent   Heart disease Other        parent   Hyperlipidemia Other        parent   Hypertension Other        parent   Arthritis Other        parent   Breast cancer Neg Hx    Colon polyps Neg Hx    Esophageal cancer Neg Hx    Rectal cancer Neg Hx    Stomach cancer Neg Hx    Social History   Socioeconomic History   Marital status: Single    Spouse name: Not on file   Number of children: 2   Years of education: 12   Highest education level: GED or equivalent  Occupational History   Occupation: retired  Tobacco Use   Smoking status: Never   Smokeless tobacco: Never  Vaping Use   Vaping Use: Never used  Substance and Sexual Activity   Alcohol use: Not Currently   Drug use: Not Currently    Types: Marijuana    Comment: every 2 weeks per pt   Sexual activity: Not Currently  Other Topics Concern   Not on file  Social History Narrative   Lives at home with her grandson.   Right-handed.   No daily caffeine use.   Social Determinants of Health   Financial Resource Strain: Low Risk  (10/22/2021)   Overall Financial Resource Strain (CARDIA)    Difficulty of Paying Living Expenses: Not very hard  Food Insecurity: Food Insecurity Present (10/22/2021)   Hunger Vital Sign    Worried About Running Out of Food in the Last Year: Sometimes true    Ran Out of Food in the Last Year: Sometimes true  Transportation Needs: No Transportation Needs (10/22/2021)   PRAPARE - Scientist, research (physical sciences) (Medical): No    Lack of Transportation (Non-Medical): No  Physical Activity: Inactive (10/22/2021)   Exercise Vital Sign    Days of Exercise per Week: 0 days    Minutes of Exercise per Session: 0 min  Stress: No Stress Concern Present (10/22/2021)   [employer] of Occupational Health - Occupational Stress Questionnaire    Feeling of Stress : Only a little  Social Connections: Socially Isolated (10/22/2021)   Social Connection and Isolation Panel [NHANES]    Frequency of Communication with Friends and Family: More than three times a week    Frequency of Social Gatherings with Friends and Family: Twice a week    Attends Religious Services: Never    Database administrator or Organizations: No    Attends Banker Meetings: Never    Marital Status: Never married    Tobacco Counseling Counseling given: Not Answered   Clinical Intake:                 Diabetic?Yes   Nutrition Risk Assessment:  Has the patient had any N/V/D within the last 2 months?  {YES/NO:21197} Does the patient have any non-healing wounds?  {YES/NO:21197} Has the patient had any unintentional weight loss or weight gain?  {YES/NO:21197}  Diabetes:  Is the patient diabetic?  {YES/NO:21197} If diabetic, was a CBG obtained today?  {YES/NO:21197} Did the patient bring in their glucometer from home?  {YES/NO:21197} How often do you monitor your CBG's? ***.   Financial Strains and Diabetes Management:  Are you having any financial strains with the device, your supplies or your medication? {YES/NO:21197}.  Does the patient want to be seen by Chronic Care Management for management of their diabetes?  {YES/NO:21197} Would the patient like to be referred to a Nutritionist or for Diabetic Management?  {YES/NO:21197}  Diabetic Exams:  {Diabetic Eye Exam:2101801} Diabetic Foot Exam: Completed 02/08/22          Activities of Daily Living     No data to display           Patient Care Team: Marcine Matar, MD as PCP - General (Internal Medicine) Jodelle Red, MD as PCP - Cardiology (Cardiology)  Indicate any recent Medical Services you may have received from other than [facility] providers in the past year (date may be approximate).     Assessment:   This is a routine wellness examination for [place].  Hearing/Vision screen No results found.  Dietary issues and exercise activities discussed:     Goals Addressed   None    Depression Screen    02/08/2022   10:53 AM 10/22/2021   11:47 AM 06/30/2021    3:59 PM 01/30/2021    2:31 PM 09/16/2020   10:34 AM 07/29/2020    3:14 PM 03/28/2020   10:47 AM  PHQ 2/9 Scores  PHQ - 2 Score 0 1 4 6 2 4 3   PHQ- 9 Score 7  6 12 10 8 8     Fall Risk    08/06/2022   10:16 AM 02/08/2022   10:53 AM 10/22/2021   11:56 AM 06/30/2021    3:59 PM 01/30/2021    2:31 PM  Fall Risk   Falls in the past year? 0 0 1 0 1  Number falls in past yr: 0 0 0 0 0  Injury with Fall? 0 0 0 0 0  Risk for fall due to : No Fall Risks No Fall Risks History of fall(s) No Fall Risks Impaired balance/gait  Follow up  Falls evaluation completed Falls evaluation completed      FALL RISK PREVENTION PERTAINING TO THE HOME:  Any stairs in or around the home? {YES/NO:21197} If so, are there any without handrails? {YES/NO:21197} Home free of loose throw rugs in walkways, pet beds, electrical cords, etc? {YES/NO:21197} Adequate lighting in your home to reduce risk of falls? {YES/NO:21197}  ASSISTIVE DEVICES UTILIZED TO PREVENT FALLS:  Life alert? {YES/NO:21197} Use of a cane, walker or w/c? {YES/NO:21197} Grab bars in the bathroom? {YES/NO:21197} Shower chair or bench in shower? {YES/NO:21197} Elevated toilet seat or a handicapped toilet? {YES/NO:21197}  TIMED UP AND GO:  Was the test performed? No . Telephonic visit   Cognitive Function:    09/16/2020   10:34 AM  MMSE - Mini Mental State Exam  Orientation to time 5   Orientation to Place 5  Registration 3  Attention/ Calculation 5  Recall 3  Language- name 2 objects 2  Language- repeat 1  Language- follow 3 step command 3  Language- read & follow direction 1  Write a sentence 1  Copy design 0  Total score 29        10/22/2021   11:59 AM  6CIT Screen  What Year? 0 points  What month? 0 points  What time? 0 points  Count back from 20 0 points  Months in reverse 0 points  Repeat phrase 0 points  Total Score 0 points    Immunizations Immunization History  Administered Date(s) Administered   Influenza,inj,Quad PF,6+ Mos 03/28/2020   Influenza-Unspecified 03/31/2012   PFIZER(Purple Top)SARS-COV-2 Vaccination 01/22/2020, 02/21/2020   Pneumococcal  Polysaccharide-23 07/26/2012   Tdap 07/26/2012   Zoster Recombinat (Shingrix) 09/16/2020    {TDAP status:2101805}  {Pneumococcal vaccine status:2101807}  Covid-19 vaccine status: Information provided on how to obtain vaccines.   Qualifies for Shingles Vaccine? Yes   Zostavax completed No   {Shingrix Completed?:2101804}  Screening Tests Health Maintenance  Topic Date Due   COVID-19 Vaccine (3 - Pfizer risk series) 03/20/2020   DTaP/Tdap/Td (2 - Td or Tdap) 07/26/2022   OPHTHALMOLOGY EXAM  09/17/2022   Medicare Annual Wellness (AWV)  10/23/2022   Zoster Vaccines- Shingrix (2 of 2) 08/07/2023 (Originally 11/11/2020)   MAMMOGRAM  11/18/2022   INFLUENZA VACCINE  12/30/2022   HEMOGLOBIN A1C  02/06/2023   FOOT EXAM  02/09/2023   Diabetic kidney evaluation - eGFR measurement  08/06/2023   Diabetic kidney evaluation - Urine ACR  08/06/2023   Hepatitis C Screening  Completed   HIV Screening  Completed   HPV VACCINES  Aged Out   PAP SMEAR-Modifier  Discontinued   Fecal DNA (Cologuard)  Discontinued    Health Maintenance  Health Maintenance Due  Topic Date Due   COVID-19 Vaccine (3 - Pfizer risk series) 03/20/2020   DTaP/Tdap/Td (2 - Td or Tdap) 07/26/2022   OPHTHALMOLOGY EXAM   09/17/2022   Medicare Annual Wellness (AWV)  10/23/2022    {Colorectal cancer screening:2101809}  {Mammogram status:21018020}  {Bone Density status:21018021}  Lung Cancer Screening: (Low Dose CT Chest recommended if Age 87-80 years, 30 pack-year currently smoking OR have quit w/in 15years.) does not qualify.   Lung Cancer Screening Referral: n/a  Additional Screening:  Hepatitis C Screening: does qualify; Completed 04/18/17  Vision Screening: Recommended annual ophthalmology exams for early detection of glaucoma and other disorders of the eye. Is the patient up to date with their annual eye exam?  {YES/NO:21197} Who is the provider or what is the name of the office in which the patient attends annual eye exams? *** If pt is not established with a provider, would they like to be referred to a provider to establish care? {YES/NO:21197}.   Dental Screening: Recommended annual dental exams for proper oral hygiene  Community Resource Referral / Chronic Care Management: CRR required this visit?  {YES/NO:21197}  CCM required this visit?  {YES/NO:21197}     Plan:     I have personally reviewed and noted the following in the patient's chart:   Medical and social history Use of alcohol, tobacco or illicit drugs  Current medications and supplements including opioid prescriptions. {Opioid Prescriptions:[phone]} Functional ability and status Nutritional status Physical activity Advanced directives List of other physicians Hospitalizations, surgeries, and ER visits in previous 12 months Vitals Screenings to include cognitive, depression, and falls Referrals and appointments  In addition, I have reviewed and discussed with patient certain preventive protocols, quality metrics, and best practice recommendations. A written personalized care plan for preventive services as well as general preventive health recommendations were provided to patient.     Durwin Nora,  [place]   1/61/0960   Due to this being a virtual visit, the after visit summary with patients personalized plan was offered to patient via mail or my-chart. ***Patient declined at this time./ Patient would like to access on my-chart/ per request, patient was mailed a copy of AVS./ Patient preferred to pick up at office at next visit  Nurse Notes: ***

## 2022-11-09 ENCOUNTER — Ambulatory Visit: Payer: 59 | Attending: Internal Medicine

## 2022-11-09 ENCOUNTER — Other Ambulatory Visit: Payer: Self-pay

## 2022-11-09 VITALS — Ht 64.0 in | Wt 303.0 lb

## 2022-11-09 DIAGNOSIS — Z0000 Encounter for general adult medical examination without abnormal findings: Secondary | ICD-10-CM

## 2022-11-12 ENCOUNTER — Other Ambulatory Visit: Payer: Self-pay

## 2022-11-12 ENCOUNTER — Telehealth: Payer: Self-pay

## 2022-11-12 NOTE — Telephone Encounter (Signed)
**Note De-Identified Tadeo Besecker Obfuscation** 6/14-READY-NO PA REQ for HST per [facility] Decision ID #: [id number]

## 2022-11-12 NOTE — Telephone Encounter (Signed)
**Note De-Identified Kimberly Frazier Obfuscation** 6/14-READY-NO PA REQ per [facility] Decision ID #: [id number]

## 2022-11-23 NOTE — Addendum Note (Signed)
Addended by: Brunetta Genera on: 11/23/2022 10:14 AM   Modules accepted: Orders

## 2022-11-24 NOTE — Progress Notes (Addendum)
[facility] - Clinic Note  11/26/2022     CHIEF COMPLAINT Patient presents for Retina Follow Up   HISTORY OF PRESENT ILLNESS: Kimberly Frazier is a 62 y.o. female who presents to the clinic today for:   HPI     Retina Follow Up   Patient presents with  Diabetic Retinopathy.  In both eyes.  This started 5 weeks ago.  I, the attending physician,  performed the HPI with the patient and updated documentation appropriately.        Comments   Patient here for 5 weeks retina follow up for NPDR OU. Patient states vision doing ok. Has eye pain OU. Sharp. Depends on when touch eye. Missed last appointment. This is a make up appointment. Using drops dark blue BID OU, Purple BID OU and gray top QID to BID OU. And refresh.      Last edited by Rennis Chris, MD on 11/26/2022 11:56 AM.    Pt missed her appt in May bc she "just got lazy", she states her blood sugar is in good shape  Referring physician: Marcine Matar, MD [address] [place],  [place] [address]  HISTORICAL INFORMATION:   Selected notes from the MEDICAL RECORD NUMBER Referred by Dr. Jethro Bolus for concern of CME    CURRENT MEDICATIONS: Current Outpatient Medications (Ophthalmic Drugs)  Medication Sig   brimonidine (ALPHAGAN) 0.2 % ophthalmic solution Place 1 drop into both eyes 2 (two) times daily.   dorzolamide-timolol (COSOPT) 2-0.5 % ophthalmic solution Place 1 drop into both eyes 2 (two) times daily.   ketorolac (ACULAR) 0.5 % ophthalmic solution Place 1 drop into both eyes 4 (four) times daily.   prednisoLONE acetate (PRED FORTE) 1 % ophthalmic suspension Place 1 drop into both eyes daily.   No current facility-administered medications for this visit. (Ophthalmic Drugs)   Current Outpatient Medications (Other)  Medication Sig   Accu-Chek Softclix Lancets lancets Use to check blood sugar 3 times daily.   amLODipine (NORVASC) 10 MG tablet Take 1 tablet (10 mg total) by mouth  daily.   aspirin EC 81 MG tablet Take 1 tablet (81 mg total) daily by mouth.   atorvastatin (LIPITOR) 80 MG tablet Take 1 tablet (80 mg total) by mouth once daily. (PLEASE SCHEDULE AN APPT FOR FUTURE REFILLS)   Blood Glucose Monitoring Suppl (ACCU-CHEK GUIDE) w/Device KIT 1 kit by Does not apply route in the morning, at noon, and at bedtime. Use to check blood sugar 3 times daily.   carvedilol (COREG) 25 MG tablet TAKE 1 TABLET (25 MG TOTAL) BY MOUTH 2 (TWO) TIMES DAILY WITH A MEAL.   Continuous Blood Gluc Receiver (FREESTYLE LIBRE 2 READER) DEVI Use as directed   Continuous Blood Gluc Sensor (FREESTYLE LIBRE SENSOR SYSTEM) MISC Change sensor every 2 weeks   diclofenac Sodium (VOLTAREN) 1 % GEL Apply 2 g topically 2 (two) times daily as needed.   ezetimibe (ZETIA) 10 MG tablet Take 1 tablet (10 mg total) by mouth daily.   glucose blood (ACCU-CHEK GUIDE) test strip Use to check blood sugar 3 times daily.   insulin glargine (LANTUS SOLOSTAR) 100 UNIT/ML Solostar Pen Inject 58 Units into the skin daily.   insulin lispro (HUMALOG KWIKPEN) 100 UNIT/ML KwikPen Inject 28 Units into the skin in the morning and at bedtime.   Insulin Pen Needle (BD PEN NEEDLE NANO U/F) 32G X 4 MM MISC Use as directed.   isosorbide mononitrate (IMDUR) 60 MG 24  hr tablet Take 1 tablet (60 mg total) by mouth daily.   Na Sulfate-K Sulfate-Mg Sulf 17.5-3.13-1.6 GM/177ML SOLN Suprep (no substitutions)-TAKE AS DIRECTED.   nitroGLYCERIN (NITROSTAT) 0.4 MG SL tablet Place 1 tab under tongue for chest pain.  May repeat after 5 minutes x 2.  DO NOT TAKE MORE THAN 3 TABS DURING AN EPISODE OF CHEST PAIN   potassium chloride (KLOR-CON) 10 MEQ tablet TAKE 2 TABLETS BY MOUTH DAILY   Semaglutide,0.25 or 0.5MG /DOS, 2 MG/3ML SOPN Inject 0.5 mg into the skin once a week.   No current facility-administered medications for this visit. (Other)   REVIEW OF SYSTEMS: ROS   Positive for: Endocrine, Cardiovascular, Eyes Negative for:  Constitutional, Gastrointestinal, Neurological, Skin, Genitourinary, Musculoskeletal, HENT, Respiratory, Psychiatric, Allergic/Imm, Heme/Lymph Last edited by Laddie Aquas, COA on 11/26/2022  8:37 AM.       ALLERGIES Allergies  Allergen Reactions   Jardiance [Empagliflozin] Other (See Comments)    Frequent yeast infection   PAST MEDICAL HISTORY Past Medical History:  Diagnosis Date   Adrenal adenoma, left    Arthritis    Cancer (HCC)    CKD (chronic kidney disease), stage II    Coronary artery disease 07-28-2018 followed [facility])  currently due to no insurance   per cardiac cath 02-06-2015 (positive mild lateral ishcemia on stress test)--- dLAD 80%,  mLAD 40%,  mPDA 80% (small vessel),  ostial D1 70%,  CFx with lumial irregarlities-- medical management   Depression    Diabetic neuropathy (HCC)    Diabetic retinopathy (HCC)    NPDR OU   Headache    History of Bell's palsy 07/2011   per pt residual facial pain on left side occasionally   History of cancer of vagina 1999   per pt completed radiation and chemo   History of sepsis 12/30/2017   positive blood culter fro E.coli   Hyperlipidemia    Hypertension    Hypertensive retinopathy    OU   Hypokalemia    Insulin dependent type 2 diabetes mellitus, uncontrolled    followed by pcp---  A1c was 11.7 on 06-15-2018 in epic   Myocardial infarction [facility])    10 yrs ago?   Nocturia    Peripheral neuropathy    PMB (postmenopausal bleeding)    Wears dentures    upper   Wears glasses    Past Surgical History:  Procedure Laterality Date   BREAST BIOPSY  2012   benign   CARDIAC CATHETERIZATION N/A 02/06/2015   Procedure: Left Heart Cath and Coronary Angiography;  Surgeon: Laurey Morale, MD;  Location: [facility] INVASIVE CV LAB;  Service: Cardiovascular;  Laterality: N/A;   CATARACT EXTRACTION Bilateral OD: 03/07/19, OS: 02/28/19   Dr. Nile Riggs   CATARACT EXTRACTION, BILATERAL     COLONOSCOPY     normal exam  in [place], [place] abour 10-12 years ago   EYE SURGERY Bilateral OD: 03/07/19, OS: 02/28/19   Cat Sx - Dr. Nile Riggs   HYSTEROSCOPY WITH D & C N/A 08/01/2018   Procedure: DILATATION AND CURETTAGE /HYSTEROSCOPY;  Surgeon: Catalina Antigua, MD;  Location:  SURGERY CENTER;  Service: Gynecology;  Laterality: N/A;   ROBOTIC ASSISTED TOTAL HYSTERECTOMY WITH BILATERAL SALPINGO OOPHERECTOMY N/A 11/28/2018   Procedure: XI ROBOTIC ASSISTED TOTAL HYSTERECTOMY WITH BILATERAL SALPINGO OOPHORECTOMY;  Surgeon: Adolphus Birchwood, MD;  Location: [facility] ORS;  Service: Gynecology;  Laterality: N/A;   SENTINEL NODE BIOPSY N/A 11/28/2018   Procedure: SENTINEL NODE BIOPSY;  Surgeon: Adolphus Birchwood, MD;  Location: [facility];  Service: Gynecology;  Laterality: N/A;   UMBILICAL HERNIA REPAIR  child   FAMILY HISTORY Family History  Problem Relation Age of Onset   Heart disease Mother    Hypertension Mother    Diabetes Mother    Cancer Father        hodgkins lymphoma   Heart disease Sister        heart attack   Diabetes Sister    Colon cancer Brother 96   Diabetes Maternal Aunt    Diabetes Maternal Uncle    Cancer Other        parent   Diabetes Other        parent   Heart disease Other        parent   Hyperlipidemia Other        parent   Hypertension Other        parent   Arthritis Other        parent   Breast cancer Neg Hx    Colon polyps Neg Hx    Esophageal cancer Neg Hx    Rectal cancer Neg Hx    Stomach cancer Neg Hx    SOCIAL HISTORY Social History   Tobacco Use   Smoking status: Never   Smokeless tobacco: Never  Vaping Use   Vaping Use: Never used  Substance Use Topics   Alcohol use: Not Currently   Drug use: Not Currently    Types: Marijuana    Comment: every 2 weeks per pt       OPHTHALMIC EXAM:  Base Eye Exam     Visual Acuity (Snellen - Linear)       Right Left   Dist [place] 20/20 -2 20/150 -2   Dist ph [place]  20/100 -2         Tonometry (Tonopen, 8:33 AM)       Right Left    Pressure 19 20         Pupils       Dark Light Shape React APD   Right 3 2 Round Brisk None   Left              Visual Fields (Counting fingers)       Left Right    Full Full         Extraocular Movement       Right Left    Full, Ortho Full, Ortho         Neuro/Psych     Oriented x3: Yes   Mood/Affect: Normal         Dilation     Both eyes: 1.0% Mydriacyl, 2.5% Phenylephrine @ 8:33 AM           Slit Lamp and Fundus Exam     Slit Lamp Exam       Right Left   Lids/Lashes Dermatochalasis - upper lid Dermatochalasis - upper lid   Conjunctiva/Sclera Mild Melanosis Mild Melanosis   Cornea 1+ Punctate epithelial erosions 2+ central punctate epithelial erosions, decreased TBUT, mild tear film debris, well healed temporal cataract wounds   Anterior Chamber Deep, 0.5+cell/pigment Deep, 0.5+cell/pigment   Iris Round and dilated, No NVI Round and dilated, No NVI   Lens PC IOL in good position with trace PCO PC IOL in good position with 1+PCO   Anterior Vitreous Vitreous syneresis, Posterior vitreous detachment Vitreous syneresis, Posterior vitreous detachment, vitreous condensations         Fundus Exam  Right Left   Disc Pink and Sharp Compact, Pink and Sharp, temporal PPA   C/D Ratio 0.1 0.1   Macula Good foveal reflex, persistent cystic changes / edema inferior macula, scattered Microaneurysms Blunted foveal reflex, central edema - slightly improved, +exudates/MA/IRH -- greatest temporal macula -- improved, +central atrophy, no heme   Vessels attenuated, Tortuous attenuated, Tortuous   Periphery Attached, scattered drusen, rare, scattered DBH, reticular degeneration Attached, scattered drusen, rare; scattered DBH--greatest inferior to disc, reticular degeneration           IMAGING AND PROCEDURES  Imaging and Procedures for 11/26/2022  OCT, Retina - OU - Both Eyes       Right Eye Quality was good. Central Foveal Thickness: 259. Progression  has been stable. Findings include no SRF, abnormal foveal contour, intraretinal hyper-reflective material, intraretinal fluid, vitreomacular adhesion (Persistent IRF/cystic changes inferior macula and fovea -- slightly improved).   Left Eye Quality was good. Central Foveal Thickness: 236. Progression has improved. Findings include no SRF, abnormal foveal contour, retinal drusen , subretinal hyper-reflective material, intraretinal hyper-reflective material, intraretinal fluid, outer retinal atrophy, vitreomacular adhesion (Mild interval improvement in IRF/IRHM inferior and temporal mac and fovea, +central ORA).   Notes *Images captured and stored on drive  Diagnosis / Impression:  Macular edema OU - likely combination of CME + DME OU OD: Persistent IRF/cystic changes inferior macula and fovea -- slightly improved OS: interval improvement in IRF/IRHM inferior and temporal mac and fovea, +central ORA  Clinical management:  See below  Abbreviations: NFP - Normal foveal profile. CME - cystoid macular edema. PED - pigment epithelial detachment. IRF - intraretinal fluid. SRF - subretinal fluid. EZ - ellipsoid zone. ERM - epiretinal membrane. ORA - outer retinal atrophy. ORT - outer retinal tubulation. SRHM - subretinal hyper-reflective material      Intravitreal Injection, Pharmacologic Agent - OD - Right Eye       Time Out 11/26/2022. 9:33 AM. Confirmed correct patient, procedure, site, and patient consented.   Anesthesia Topical anesthesia was used. Anesthetic medications included Lidocaine 2%, Proparacaine 0.5%.   Procedure Preparation included 5% betadine to ocular surface, eyelid speculum. A (32g) needle was used.   Injection: 2 mg aflibercept 2 MG/0.05ML   Route: Intravitreal, Site: Right Eye   NDC: L6038910, Lot: [id number], Expiration date: 11/28/2023, Waste: 0 mL   Post-op Post injection exam found visual acuity of at least counting fingers. The patient tolerated the  procedure well. There were no complications. The patient received written and verbal post procedure care education.      Intravitreal Injection, Pharmacologic Agent - OS - Left Eye       Time Out 11/26/2022. 9:34 AM. Confirmed correct patient, procedure, site, and patient consented.   Anesthesia Topical anesthesia was used. Anesthetic medications included Lidocaine 2%, Proparacaine 0.5%.   Procedure Preparation included 5% betadine to ocular surface, eyelid speculum. A (32g) needle was used.   Injection: 6 mg faricimab-svoa 6 MG/0.05ML   Route: Intravitreal, Site: Left Eye   NDC: O8010301, Lot: [id number], Expiration date: 09/27/2024, Waste: 0 mL   Post-op Post injection exam found visual acuity of at least counting fingers. The patient tolerated the procedure well. There were no complications. The patient received written and verbal post procedure care education. Post injection medications were not given.             ASSESSMENT/PLAN:    ICD-10-CM   1. Moderate nonproliferative diabetic retinopathy of both eyes with macular edema associated with type 2 diabetes mellitus (  HCC)  E11.3313 OCT, Retina - OU - Both Eyes    Intravitreal Injection, Pharmacologic Agent - OD - Right Eye    Intravitreal Injection, Pharmacologic Agent - OS - Left Eye    faricimab-svoa (VABYSMO) 6mg /0.43mL intravitreal injection    aflibercept (EYLEA) SOLN 2 mg    2. Current use of insulin (HCC)  Z79.4     3. Long-term (current) use of injectable non-insulin antidiabetic drugs  Z79.85     4. CME (cystoid macular edema), bilateral  H35.353     5. Essential hypertension  I10     6. Hypertensive retinopathy of both eyes  H35.033     7. Pseudophakia of both eyes  Z96.1     8. 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 Dry eyes, bilateral  H04.123       1-4. Moderate Non-proliferative diabetic retinopathy, OU OD with combination DME/CME - delayed f/u -- 9 wks instead of 5  - last A1c 8.2  on 03.08.24; 9.6 on 08.07.23, 10.1 on 1.31.23  - pt now on Ozempic as of Mar 2024 - h/o delayed to follow up from September 2021 to April 2022--7 mos instead of 4 wks**             - exam shows scattered IRH, edema and tortuous blood vessels OU - FA (10.23.20) shows leaking MA OU and paracentral petaloid hyperfluorecence OD --> CME/[place] - s/p IVA OD #1 (11.06.20), #2 (12.18.20), #3 (02.24.21), #4 (03.24.21), #5 (04.21.21), #6 (5.26.21), #7 (07.26.21), #8 (08.25.21), #9 (09.23.21), #10 (04.26.22) -- IVA resistance - s/p IVA OS #1 (10.23.20), #2 (11.18.20), #3 (12.18.20), #4 (02.24.21), #5 (03.24.21) -- IVA resistance  ======================================================== - s/p IVE OD #1 (05.25.22), #2 (07.01.22), #3 (07.29.22), #4 (08.29.22 - JDM), #5 (09.26.22), #6 (10.24.22), #7 (11.21.22), #8 (12.19.22), #9 (01.23.23), #10 (02.20.23), #11 (03.22.23), #12 (04.19.23), #13 (05.17.23), #14 (06.14.23), #15 (08.04.23), #16 (09.01.23), #17 (10.10.23), #18 (11.14.23), #19 (12.19.23), #20 (02.13.24), #21 (03.20.24), #22 (03.20.24), #23 (03.20.24), #24 (04.24.24) - s/p IVE OS #1 (04.21.21), #2 (5.26.21), #3 (07.26.21), #4 (08.25.21), #5 (09.23.21), #6 (04.26.22), #7 (05.25.22), #8 (07.01.22), #9 (07.29.22), #10 (08.29.22 - JDM), #11 (09.26.22), #12 (10.24.22) -- IVE resistance ======================================================== - s/p IV Vabysmo OS #1 (11.21.22, sample), #2 (12.19.22), #3 (01.23.23), #4 (02.20.23), #5 (03.22.23), #6 (04.19.23), #7 (05.17.23), #8 (06.14.23, sample), #9 (08.04.23), #10 (9.01.23), #11 (10.10.23), #12 (11.14.23), #13 (12.19.23), #14 (02.13.24), #15 (03.20.24) #16(3.20.24), #17 (03.20.24), #18 (04.24.24) - BCVA OD 20/20; OS 20/100 -- stable OU - OCT today: OD: Persistent IRF/cystic changes inferior macula and fovea  OS: interval improvement in IRF/IRHM inferior and temporal mac and fovea, +central ORA at 9 weeks - recommend IVE OD #25 and IVV OS #19 today, 06.28.24  with follow up in 6-8 weeks  - pt wishes to proceed w/ injections             - RBA of procedure discussed, questions answered             - informed consent obtained       - Eylea OU informed consent form re-signed and scanned on 05.17.23             - Vabysmo OS consent form signed and scanned on 11.21.23             - see procedure note             - discontinue Pred Forte qd OU, continue Ketorolac QID OU             -  f/u in 6-8 weeks, DFE, OCT, possible injection OU   5,6. Hypertensive retinopathy OU             - discussed importance of tight BP control             - continue to monitor   7. Pseudophakia OU            - s/p CE/IOL OU Nile Riggs)                        OD: 03/07/2019                        OS: 02/28/2019             - IOLs in good position             - CME OU as above             - continue to monitor   8. Ocular Hypertension OU             - IOP: 19,20 - h/o steroid response             - cont Cosopt BID OU and Brimonidine BID OU  9. Dry eyes OU - recommend artificial tears and lubricating ointment as needed   Ophthalmic Meds Ordered this visit:  Meds ordered this encounter  Medications   faricimab-svoa (VABYSMO) 6mg /0.79mL intravitreal injection   aflibercept (EYLEA) SOLN 2 mg     Return for 6-8 wks - +DME OU, Dilated Exam, OCT, Possible Injxn.  There are no Patient Instructions on file for this visit.  This document serves as a record of services personally performed by Karie Chimera, MD, PhD. It was created on their behalf by Berlin Hun COT, an ophthalmic technician. The creation of this record is the provider's dictation and/or activities during the visit.    Electronically signed by: Berlin Hun COT 6.26.2024 12:01 PM  This document serves as a record of services personally performed by Karie Chimera, MD, PhD. It was created on their behalf by Glee Arvin. Manson Passey, OA an ophthalmic technician. The creation of this record is the  provider's dictation and/or activities during the visit.    Electronically signed by: Glee Arvin. Manson Passey, OA 11/26/22 12:01 PM  Karie Chimera, M.D., Ph.D. Diseases & Surgery of the Retina and Vitreous [facility] 11/26/2022   I have reviewed the above documentation for accuracy and completeness, and I agree with the above. Karie Chimera, M.D., Ph.D. 11/26/22 12:02 PM  Abbreviations: M myopia (nearsighted); A astigmatism; H hyperopia (farsighted); P presbyopia; Mrx spectacle prescription;  CTL contact lenses; OD right eye; OS left eye; OU both eyes  XT exotropia; ET esotropia; PEK punctate epithelial keratitis; PEE punctate epithelial erosions; DES dry eye syndrome; MGD meibomian gland dysfunction; ATs artificial tears; PFAT's preservative free artificial tears; NSC nuclear sclerotic cataract; PSC posterior subcapsular cataract; ERM epi-retinal membrane; PVD posterior vitreous detachment; RD retinal detachment; DM diabetes mellitus; DR diabetic retinopathy; NPDR non-proliferative diabetic retinopathy; PDR proliferative diabetic retinopathy; CSME clinically significant macular edema; DME diabetic macular edema; dbh dot blot hemorrhages; CWS cotton wool spot; POAG primary open angle glaucoma; C/D cup-to-disc ratio; HVF humphrey visual field; GVF goldmann visual field; OCT optical coherence tomography; IOP intraocular pressure; BRVO Branch retinal vein occlusion; CRVO central retinal vein occlusion; CRAO central retinal artery occlusion; BRAO branch retinal artery occlusion; RT retinal tear;  SB scleral buckle; PPV pars plana vitrectomy; VH Vitreous hemorrhage; PRP panretinal laser photocoagulation; IVK intravitreal kenalog; VMT vitreomacular traction; MH Macular hole;  NVD neovascularization of the disc; NVE neovascularization elsewhere; AREDS age related eye disease study; ARMD age related macular degeneration; POAG primary open angle glaucoma; EBMD epithelial/anterior basement  membrane dystrophy; ACIOL anterior chamber intraocular lens; IOL intraocular lens; PCIOL posterior chamber intraocular lens; Phaco/IOL phacoemulsification with intraocular lens placement; PRK photorefractive keratectomy; LASIK laser assisted in situ keratomileusis; HTN hypertension; DM diabetes mellitus; COPD chronic obstructive pulmonary disease

## 2022-11-26 ENCOUNTER — Other Ambulatory Visit (INDEPENDENT_AMBULATORY_CARE_PROVIDER_SITE_OTHER): Payer: Self-pay | Admitting: Ophthalmology

## 2022-11-26 ENCOUNTER — Encounter (INDEPENDENT_AMBULATORY_CARE_PROVIDER_SITE_OTHER): Payer: Self-pay | Admitting: Ophthalmology

## 2022-11-26 ENCOUNTER — Ambulatory Visit (INDEPENDENT_AMBULATORY_CARE_PROVIDER_SITE_OTHER): Payer: 59 | Admitting: Ophthalmology

## 2022-11-26 ENCOUNTER — Other Ambulatory Visit: Payer: Self-pay

## 2022-11-26 ENCOUNTER — Other Ambulatory Visit: Payer: Self-pay | Admitting: Cardiology

## 2022-11-26 DIAGNOSIS — Z794 Long term (current) use of insulin: Secondary | ICD-10-CM | POA: Diagnosis not present

## 2022-11-26 DIAGNOSIS — E113313 Type 2 diabetes mellitus with moderate nonproliferative diabetic retinopathy with macular edema, bilateral: Secondary | ICD-10-CM | POA: Diagnosis not present

## 2022-11-26 DIAGNOSIS — H35353 Cystoid macular degeneration, bilateral: Secondary | ICD-10-CM | POA: Diagnosis not present

## 2022-11-26 DIAGNOSIS — I10 Essential (primary) hypertension: Secondary | ICD-10-CM

## 2022-11-26 DIAGNOSIS — Z961 Presence of intraocular lens: Secondary | ICD-10-CM

## 2022-11-26 DIAGNOSIS — H35033 Hypertensive retinopathy, bilateral: Secondary | ICD-10-CM | POA: Diagnosis not present

## 2022-11-26 DIAGNOSIS — H40053 Ocular hypertension, bilateral: Secondary | ICD-10-CM

## 2022-11-26 DIAGNOSIS — Z7985 Long-term (current) use of injectable non-insulin antidiabetic drugs: Secondary | ICD-10-CM | POA: Diagnosis not present

## 2022-11-26 DIAGNOSIS — H04123 Dry eye syndrome of bilateral lacrimal glands: Secondary | ICD-10-CM

## 2022-11-26 MED ORDER — AFLIBERCEPT 2MG/0.05ML IZ SOLN FOR KALEIDOSCOPE
2.0000 mg | INTRAVITREAL | Status: AC | PRN
Start: 2022-11-26 — End: 2022-11-26
  Administered 2022-11-26: 2 mg via INTRAVITREAL

## 2022-11-26 MED ORDER — FARICIMAB-SVOA 6 MG/0.05ML IZ SOLN
6.0000 mg | INTRAVITREAL | Status: AC | PRN
Start: 2022-11-26 — End: 2022-11-26
  Administered 2022-11-26: 6 mg via INTRAVITREAL

## 2022-11-26 MED ORDER — ISOSORBIDE MONONITRATE ER 60 MG PO TB24
60.0000 mg | ORAL_TABLET | Freq: Every day | ORAL | 0 refills | Status: DC
  Filled 2022-11-26: qty 90, fill #0
  Filled 2022-12-20: qty 90, 90d supply, fill #0

## 2022-12-17 ENCOUNTER — Telehealth: Payer: Self-pay

## 2022-12-17 NOTE — Telephone Encounter (Signed)
Call to pt reference starting PREP on 12/27/22.  Confirmed she wants to do.  Intake scheduled for 12/22/22 at 930a at [facility]. Will meet pt in lobby

## 2022-12-19 ENCOUNTER — Other Ambulatory Visit: Payer: Self-pay | Admitting: General Practice

## 2022-12-20 ENCOUNTER — Other Ambulatory Visit: Payer: Self-pay

## 2022-12-20 ENCOUNTER — Other Ambulatory Visit: Payer: Self-pay | Admitting: Internal Medicine

## 2022-12-20 DIAGNOSIS — E1169 Type 2 diabetes mellitus with other specified complication: Secondary | ICD-10-CM

## 2022-12-20 MED ORDER — EZETIMIBE 10 MG PO TABS
10.0000 mg | ORAL_TABLET | Freq: Every day | ORAL | 3 refills | Status: DC
  Filled 2022-12-20: qty 90, 90d supply, fill #0
  Filled 2023-05-03 (×2): qty 90, 90d supply, fill #1
  Filled 2023-07-12 – 2023-09-19 (×2): qty 90, 90d supply, fill #2
  Filled 2023-10-25: qty 90, 90d supply, fill #3

## 2022-12-21 ENCOUNTER — Other Ambulatory Visit: Payer: Self-pay

## 2022-12-21 MED ORDER — OZEMPIC (0.25 OR 0.5 MG/DOSE) 2 MG/3ML ~~LOC~~ SOPN
0.5000 mg | PEN_INJECTOR | SUBCUTANEOUS | 2 refills | Status: DC
Start: 2022-12-21 — End: 2023-03-30
  Filled 2022-12-21: qty 3, 28d supply, fill #0
  Filled 2023-02-02: qty 3, 28d supply, fill #1
  Filled 2023-02-28: qty 3, 28d supply, fill #2

## 2022-12-21 NOTE — Telephone Encounter (Signed)
Requested Prescriptions  Pending Prescriptions Disp Refills   Semaglutide,0.25 or 0.5MG /DOS, (OZEMPIC, 0.25 OR 0.5 MG/DOSE,) 2 MG/3ML SOPN 3 mL 2    Sig: Inject 0.5 mg into the skin once a week.     Endocrinology:  Diabetes - GLP-1 Receptor Agonists - semaglutide Failed - 12/20/2022  3:22 PM      Failed - HBA1C in normal range and within 180 days    HbA1c, POC (controlled diabetic range)  Date Value Ref Range Status  08/06/2022 8.2 (A) 0.0 - 7.0 % Final         Failed - Cr in normal range and within 360 days    Creatinine, Ser  Date Value Ref Range Status  08/06/2022 1.12 (H) 0.57 - 1.00 mg/dL Final   Creatinine,U  Date Value Ref Range Status  07/15/2015 107.0 mg/dL Final   Creatinine, Urine  Date Value Ref Range Status  01/01/2018 52.71 mg/dL Final         Passed - Valid encounter within last 6 months    Recent Outpatient Visits           4 months ago Type 2 diabetes mellitus with morbid obesity (HCC)   [place] [facility] Jonah Blue B, MD   10 months ago Type 2 diabetes mellitus with morbid obesity [facility])   [place] [facility] Jonah Blue B, MD   11 months ago Type 2 diabetes mellitus with morbid obesity [facility])   [place] [facility] Marcine Matar, MD   11 months ago Erroneous encounter - disregard   [place] [facility] Jonah Blue B, MD   1 year ago Type 2 diabetes mellitus with morbid obesity [facility])   [place] [facility] Marcine Matar, MD       Future Appointments             In 1 month Dan Humphreys, Storm Frisk, NP  Heart & Vascular at [facility], [place]

## 2022-12-22 ENCOUNTER — Telehealth: Payer: Self-pay

## 2022-12-22 ENCOUNTER — Other Ambulatory Visit: Payer: Self-pay

## 2022-12-22 NOTE — Telephone Encounter (Signed)
VMT pt requesting call back Had intake scheduled for 930am today, checking in to see if she is coming today.

## 2022-12-24 ENCOUNTER — Other Ambulatory Visit: Payer: Self-pay

## 2022-12-28 ENCOUNTER — Ambulatory Visit (HOSPITAL_BASED_OUTPATIENT_CLINIC_OR_DEPARTMENT_OTHER): Payer: 59 | Admitting: Cardiology

## 2023-01-10 ENCOUNTER — Other Ambulatory Visit: Payer: Self-pay

## 2023-01-11 ENCOUNTER — Other Ambulatory Visit: Payer: Self-pay

## 2023-01-13 NOTE — Progress Notes (Signed)
[facility] - Clinic Note  01/14/2023     CHIEF COMPLAINT Patient presents for Retina Follow Up   HISTORY OF PRESENT ILLNESS: Kimberly Frazier is a 62 y.o. female who presents to the clinic today for:   HPI     Retina Follow Up   Patient presents with  Diabetic Retinopathy.  In both eyes.  This started months ago.  Duration of 4 weeks.  Since onset it is stable.  I, the attending physician,  performed the HPI with the patient and updated documentation appropriately.        Comments   Patient states that there has been no changes in her vision. She is using Brimonidine OU BID, Cosopt OU BID, and Ketorolac OU QID. She has not checked her blood sugar recently.       Last edited by Rennis Chris, MD on 01/14/2023 12:28 PM.    Patient feels that vision is the same.   Referring physician: Marcine Matar, MD [address] [place],  [place] [address]  HISTORICAL INFORMATION:   Selected notes from the MEDICAL RECORD NUMBER Referred by Dr. Jethro Bolus for concern of CME    CURRENT MEDICATIONS: Current Outpatient Medications (Ophthalmic Drugs)  Medication Sig   brimonidine (ALPHAGAN) 0.15 % ophthalmic solution Place 1 drop into both eyes 2 (two) times daily.   brimonidine (ALPHAGAN) 0.2 % ophthalmic solution Place 1 drop into both eyes 2 (two) times daily.   dorzolamide-timolol (COSOPT) 2-0.5 % ophthalmic solution Place 1 drop into both eyes 2 (two) times daily.   ketorolac (ACULAR) 0.5 % ophthalmic solution Place 1 drop into both eyes 4 (four) times daily.   prednisoLONE acetate (PRED FORTE) 1 % ophthalmic suspension Place 1 drop into both eyes daily.   No current facility-administered medications for this visit. (Ophthalmic Drugs)   Current Outpatient Medications (Other)  Medication Sig   Accu-Chek Softclix Lancets lancets Use to check blood sugar 3 times daily.   amLODipine (NORVASC) 10 MG tablet Take 1 tablet (10 mg total) by mouth daily.    aspirin EC 81 MG tablet Take 1 tablet (81 mg total) daily by mouth.   atorvastatin (LIPITOR) 80 MG tablet Take 1 tablet (80 mg total) by mouth once daily. (PLEASE SCHEDULE AN APPT FOR FUTURE REFILLS)   Blood Glucose Monitoring Suppl (ACCU-CHEK GUIDE) w/Device KIT 1 kit by Does not apply route in the morning, at noon, and at bedtime. Use to check blood sugar 3 times daily.   carvedilol (COREG) 25 MG tablet TAKE 1 TABLET (25 MG TOTAL) BY MOUTH 2 (TWO) TIMES DAILY WITH A MEAL.   Continuous Blood Gluc Receiver (FREESTYLE LIBRE 2 READER) DEVI Use as directed   Continuous Blood Gluc Sensor (FREESTYLE LIBRE SENSOR SYSTEM) MISC Change sensor every 2 weeks   diclofenac Sodium (VOLTAREN) 1 % GEL Apply 2 g topically 2 (two) times daily as needed.   ezetimibe (ZETIA) 10 MG tablet Take 1 tablet (10 mg total) by mouth daily.   glucose blood (ACCU-CHEK GUIDE) test strip Use to check blood sugar 3 times daily.   insulin glargine (LANTUS SOLOSTAR) 100 UNIT/ML Solostar Pen Inject 58 Units into the skin daily.   insulin lispro (HUMALOG KWIKPEN) 100 UNIT/ML KwikPen Inject 28 Units into the skin in the morning and at bedtime.   Insulin Pen Needle (BD PEN NEEDLE NANO U/F) 32G X 4 MM MISC Use as directed.   isosorbide mononitrate (IMDUR) 60 MG 24 hr tablet Take 1  tablet (60 mg total) by mouth daily.   Na Sulfate-K Sulfate-Mg Sulf 17.5-3.13-1.6 GM/177ML SOLN Suprep (no substitutions)-TAKE AS DIRECTED.   nitroGLYCERIN (NITROSTAT) 0.4 MG SL tablet Place 1 tab under tongue for chest pain.  May repeat after 5 minutes x 2.  DO NOT TAKE MORE THAN 3 TABS DURING AN EPISODE OF CHEST PAIN   potassium chloride (KLOR-CON) 10 MEQ tablet TAKE 2 TABLETS BY MOUTH DAILY   Semaglutide,0.25 or 0.5MG /DOS, (OZEMPIC, 0.25 OR 0.5 MG/DOSE,) 2 MG/3ML SOPN Inject 0.5 mg into the skin once a week.   No current facility-administered medications for this visit. (Other)   REVIEW OF SYSTEMS: ROS   Positive for: Endocrine, Cardiovascular,  Eyes Negative for: Constitutional, Gastrointestinal, Neurological, Skin, Genitourinary, Musculoskeletal, HENT, Respiratory, Psychiatric, Allergic/Imm, Heme/Lymph Last edited by Charlette Caffey, COT on 01/14/2023  8:48 AM.     ALLERGIES Allergies  Allergen Reactions   Jardiance [Empagliflozin] Other (See Comments)    Frequent yeast infection   PAST MEDICAL HISTORY Past Medical History:  Diagnosis Date   Adrenal adenoma, left    Arthritis    Cancer (HCC)    CKD (chronic kidney disease), stage II    Coronary artery disease 07-28-2018 followed [facility])  currently due to no insurance   per cardiac cath 02-06-2015 (positive mild lateral ishcemia on stress test)--- dLAD 80%,  mLAD 40%,  mPDA 80% (small vessel),  ostial D1 70%,  CFx with lumial irregarlities-- medical management   Depression    Diabetic neuropathy (HCC)    Diabetic retinopathy (HCC)    NPDR OU   Headache    History of Bell's palsy 07/2011   per pt residual facial pain on left side occasionally   History of cancer of vagina 1999   per pt completed radiation and chemo   History of sepsis 12/30/2017   positive blood culter fro E.coli   Hyperlipidemia    Hypertension    Hypertensive retinopathy    OU   Hypokalemia    Insulin dependent type 2 diabetes mellitus, uncontrolled    followed by pcp---  A1c was 11.7 on 06-15-2018 in epic   Myocardial infarction [facility])    10 yrs ago?   Nocturia    Peripheral neuropathy    PMB (postmenopausal bleeding)    Wears dentures    upper   Wears glasses    Past Surgical History:  Procedure Laterality Date   BREAST BIOPSY  2012   benign   CARDIAC CATHETERIZATION N/A 02/06/2015   Procedure: Left Heart Cath and Coronary Angiography;  Surgeon: Laurey Morale, MD;  Location: [facility] INVASIVE CV LAB;  Service: Cardiovascular;  Laterality: N/A;   CATARACT EXTRACTION Bilateral OD: 03/07/19, OS: 02/28/19   Dr. Nile Riggs   CATARACT EXTRACTION, BILATERAL     COLONOSCOPY      normal exam in [place], [place] abour 10-12 years ago   EYE SURGERY Bilateral OD: 03/07/19, OS: 02/28/19   Cat Sx - Dr. Nile Riggs   HYSTEROSCOPY WITH D & C N/A 08/01/2018   Procedure: DILATATION AND CURETTAGE /HYSTEROSCOPY;  Surgeon: Catalina Antigua, MD;  Location: [facility];  Service: Gynecology;  Laterality: N/A;   ROBOTIC ASSISTED TOTAL HYSTERECTOMY WITH BILATERAL SALPINGO OOPHERECTOMY N/A 11/28/2018   Procedure: XI ROBOTIC ASSISTED TOTAL HYSTERECTOMY WITH BILATERAL SALPINGO OOPHORECTOMY;  Surgeon: Adolphus Birchwood, MD;  Location: [facility];  Service: Gynecology;  Laterality: N/A;   SENTINEL NODE BIOPSY N/A 11/28/2018   Procedure: SENTINEL NODE BIOPSY;  Surgeon: Adolphus Birchwood, MD;  Location: [facility];  Service: Gynecology;  Laterality: N/A;   UMBILICAL HERNIA REPAIR  child   FAMILY HISTORY Family History  Problem Relation Age of Onset   Heart disease Mother    Hypertension Mother    Diabetes Mother    Cancer Father        hodgkins lymphoma   Heart disease Sister        heart attack   Diabetes Sister    Colon cancer Brother 25   Diabetes Maternal Aunt    Diabetes Maternal Uncle    Cancer Other        parent   Diabetes Other        parent   Heart disease Other        parent   Hyperlipidemia Other        parent   Hypertension Other        parent   Arthritis Other        parent   Breast cancer Neg Hx    Colon polyps Neg Hx    Esophageal cancer Neg Hx    Rectal cancer Neg Hx    Stomach cancer Neg Hx    SOCIAL HISTORY Social History   Tobacco Use   Smoking status: Never   Smokeless tobacco: Never  Vaping Use   Vaping status: Never Used  Substance Use Topics   Alcohol use: Not Currently   Drug use: Not Currently    Types: Marijuana    Comment: every 2 weeks per pt       OPHTHALMIC EXAM:  Base Eye Exam     Visual Acuity (Snellen - Linear)       Right Left   Dist [place] 20/20 20/150   Dist ph [place]  20/100 +1         Tonometry (Tonopen, 8:51 AM)        Right Left   Pressure 14 17         Pupils       Dark Light Shape React APD   Right 3 2 Round Brisk None   Left 3 2 Round Brisk None         Visual Fields       Left Right    Full Full         Extraocular Movement       Right Left    Full, Ortho Full, Ortho         Neuro/Psych     Oriented x3: Yes   Mood/Affect: Normal         Dilation     Both eyes: 1.0% Mydriacyl, 2.5% Phenylephrine @ 8:49 AM           Slit Lamp and Fundus Exam     Slit Lamp Exam       Right Left   Lids/Lashes Dermatochalasis - upper lid Dermatochalasis - upper lid   Conjunctiva/Sclera Mild Melanosis Mild Melanosis   Cornea 1+ Punctate epithelial erosions 2+ central punctate epithelial erosions, decreased TBUT, mild tear film debris, well healed temporal cataract wounds   Anterior Chamber Deep, 0.5+cell/pigment Deep, 0.5+cell/pigment   Iris Round and dilated, No NVI Round and dilated, No NVI   Lens PC IOL in good position with trace PCO PC IOL in good position with 1+PCO   Anterior Vitreous Vitreous syneresis, Posterior vitreous detachment Vitreous syneresis, Posterior vitreous detachment, vitreous condensations         Fundus Exam       Right Left  Disc Pink and Sharp Compact, Pink and Sharp, temporal PPA   C/D Ratio 0.1 0.1   Macula Good foveal reflex, persistent cystic changes / edema inferior macula-- slightly improved, scattered Microaneurysms Blunted foveal reflex, central edema - slightly improved, +exudates/MA/IRH -- greatest temporal macula -- improved, +central atrophy, no heme   Vessels attenuated, Tortuous attenuated, Tortuous   Periphery Attached, scattered drusen, rare, scattered DBH, reticular degeneration Attached, scattered drusen, scattered DBH--greatest nasal to disc, reticular degeneration           IMAGING AND PROCEDURES  Imaging and Procedures for 01/14/2023  OCT, Retina - OU - Both Eyes       Right Eye Quality was good. Central Foveal  Thickness: 251. Progression has improved. Findings include no SRF, abnormal foveal contour, intraretinal hyper-reflective material, intraretinal fluid, vitreomacular adhesion (Persistent IRF/cystic changes inferior macula and fovea -- slightly improved).   Left Eye Quality was good. Central Foveal Thickness: 221. Progression has improved. Findings include no SRF, abnormal foveal contour, retinal drusen , subretinal hyper-reflective material, intraretinal hyper-reflective material, intraretinal fluid, outer retinal atrophy, vitreomacular adhesion (Mild interval improvement in IRF/IRHM inferior and temporal mac and fovea, +central ORA).   Notes *Images captured and stored on drive  Diagnosis / Impression:  Macular edema OU - likely combination of CME + DME OU OD: Persistent IRF/cystic changes inferior macula and fovea -- slightly improved OS: interval improvement in IRF/IRHM inferior and temporal mac and fovea, +central ORA  Clinical management:  See below  Abbreviations: NFP - Normal foveal profile. CME - cystoid macular edema. PED - pigment epithelial detachment. IRF - intraretinal fluid. SRF - subretinal fluid. EZ - ellipsoid zone. ERM - epiretinal membrane. ORA - outer retinal atrophy. ORT - outer retinal tubulation. SRHM - subretinal hyper-reflective material      Intravitreal Injection, Pharmacologic Agent - OD - Right Eye       Time Out 01/14/2023. 9:39 AM. Confirmed correct patient, procedure, site, and patient consented.   Anesthesia Topical anesthesia was used. Anesthetic medications included Lidocaine 2%, Proparacaine 0.5%.   Procedure Preparation included 5% betadine to ocular surface, eyelid speculum. A (32g) needle was used.   Injection: 2 mg aflibercept 2 MG/0.05ML   Route: Intravitreal, Site: Right Eye   NDC: L6038910, Lot: [id number], Expiration date: 08/29/2023, Waste: 0 mL   Post-op Post injection exam found visual acuity of at least counting fingers. The  patient tolerated the procedure well. There were no complications. The patient received written and verbal post procedure care education.      Intravitreal Injection, Pharmacologic Agent - OS - Left Eye       Time Out 01/14/2023. 9:39 AM. Confirmed correct patient, procedure, site, and patient consented.   Anesthesia Topical anesthesia was used. Anesthetic medications included Lidocaine 2%, Proparacaine 0.5%.   Procedure Preparation included 5% betadine to ocular surface, eyelid speculum. A (32g) needle was used.   Injection: 6 mg faricimab-svoa 6 MG/0.05ML   Route: Intravitreal, Site: Left Eye   NDC: O8010301, Lot: [id number], Expiration date: 01/28/2025, Waste: 0 mL   Post-op Post injection exam found visual acuity of at least counting fingers. The patient tolerated the procedure well. There were no complications. The patient received written and verbal post procedure care education. Post injection medications were not given.            ASSESSMENT/PLAN:    ICD-10-CM   1. 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 Current use of insulin (HCC)  Z79.4     3. Long-term (current) use of injectable non-insulin antidiabetic drugs  Z79.85     4. CME (cystoid macular edema), bilateral  H35.353     5. Essential hypertension  I10     6. Hypertensive retinopathy of both eyes  H35.033     7. Pseudophakia of both eyes  Z96.1     8. 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 Dry eyes, bilateral  H04.123      1-4. Moderate Non-proliferative diabetic retinopathy, OU OD with combination DME/CME  - last A1c 8.2 on 03.08.24; 9.6 on  08.07.23, 10.1 on 1.31.23  - pt now on Ozempic as of Mar 2024 - h/o delayed to follow up from September 2021 to April 2022--7 mos instead of 4 wks**             - exam shows scattered IRH, edema and tortuous blood vessels OU - FA (10.23.20) shows leaking MA OU and paracentral petaloid hyperfluorecence OD --> CME/[place] - s/p IVA OD #1 (11.06.20), #2 (12.18.20), #3 (02.24.21), #4 (03.24.21), #5 (04.21.21), #6 (5.26.21), #7 (07.26.21), #8 (08.25.21), #9 (09.23.21), #10 (04.26.22) -- IVA resistance - s/p IVA OS #1 (10.23.20), #2 (11.18.20), #3 (12.18.20), #4 (02.24.21), #5 (03.24.21) -- IVA resistance  ======================================================== - s/p IVE OD #1 (05.25.22), #2 (07.01.22), #3 (07.29.22), #4 (08.29.22 - JDM), #5 (09.26.22), #6 (10.24.22), #7 (11.21.22), #8 (12.19.22), #9 (01.23.23), #10 (02.20.23), #11 (03.22.23), #12 (04.19.23), #13 (05.17.23), #14 (06.14.23), #15 (08.04.23), #16 (09.01.23), #17 (10.10.23), #18 (11.14.23), #19 (12.19.23), #20 (02.13.24), #21 (03.20.24), #22 (03.20.24), #23 (03.20.24), #24 (04.24.24) #25(06.28.24) - s/p IVE OS #1 (04.21.21), #2 (5.26.21), #3 (07.26.21), #4 (08.25.21), #5 (09.23.21), #6 (04.26.22), #7 (05.25.22), #8 (07.01.22), #9 (07.29.22), #10 (08.29.22 - JDM), #11 (09.26.22), #12 (10.24.22) -- IVE resistance ======================================================== - s/p IV Vabysmo OS #1 (11.21.22, sample), #2 (12.19.22), #3 (01.23.23), #4 (02.20.23), #5 (03.22.23), #6 (04.19.23), #7 (05.17.23), #8 (06.14.23, sample), #9 (08.04.23), #10 (9.01.23), #11 (10.10.23), #12 (11.14.23), #13 (12.19.23), #14 (02.13.24), #15 (03.20.24) #16(3.20.24), #17 (03.20.24), #18 (04.24.24) #19(06.28.24) - BCVA OD 20/20; OS 20/100 -- stable OU - OCT today: OD: Persistent IRF/cystic changes inferior macula and fovea -- slightly improved  OS: interval improvement in IRF/IRHM inferior and temporal mac and fovea, +central ORA at 7 weeks - recommend IVE OD #25 and  IVV OS #19 today, 06.28.24 with follow up in 7 weeks again  - pt wishes to proceed w/ injections             - RBA of procedure discussed, questions answered             - Eylea OU informed consent form re-signed and scanned on 05.17.23  - Eylea OU informed consent form re-signed and scanned on 8.16.24 (OD) - Vabysmo OS consent form signed and scanned on 11.21.23 - see procedure note             - continue Ketorolac QID OU             - f/u in 7 weeks, DFE, OCT, possible injection OU   5,6. Hypertensive retinopathy OU             - discussed  importance of tight BP control             - continue to monitor   7. Pseudophakia OU            - s/p CE/IOL OU Nile Riggs)                        OD: 03/07/2019                        OS: 02/28/2019             - IOLs in good position             - CME OU as above             - continue to monitor   8. Ocular Hypertension OU             - IOP: 14, 17 - h/o steroid response             - cont Cosopt BID OU and Brimonidine BID OU  9. Dry eyes OU - recommend artificial tears and lubricating ointment as needed   Ophthalmic Meds Ordered this visit:  Meds ordered this encounter  Medications   brimonidine (ALPHAGAN) 0.15 % ophthalmic solution    Sig: Place 1 drop into both eyes 2 (two) times daily.    Dispense:  10 mL    Refill:  10   faricimab-svoa (VABYSMO) 6mg /0.74mL intravitreal injection   aflibercept (EYLEA) SOLN 2 mg     Return in about 7 weeks (around 03/04/2023) for f/u  NPDR OU, DFE, OCT, Possible, IVE, OD, IVV, OS.  There are no Patient Instructions on file for this visit.  This document serves as a record of services personally performed by Karie Chimera, MD, PhD. It was created on their behalf by Berlin Hun COT, an ophthalmic technician. The creation of this record is the provider's dictation and/or activities during the visit.    Electronically signed by: Berlin Hun COT 08.15.24 12:33 PM  This document  serves as a record of services personally performed by Karie Chimera, MD, PhD. It was created on their behalf by Laurey Morale, COT an ophthalmic technician. The creation of this record is the provider's dictation and/or activities during the visit.    Electronically signed by:  Charlette Caffey, COT  01/14/23 12:33 PM   Karie Chimera, M.D., Ph.D. Diseases & Surgery of the Retina and Vitreous [facility] 01/14/2023   I have reviewed the above documentation for accuracy and completeness, and I agree with the above. Karie Chimera, M.D., Ph.D. 01/14/23 12:33 PM   Abbreviations: M myopia (nearsighted); A astigmatism; H hyperopia (farsighted); P presbyopia; Mrx spectacle prescription;  CTL contact lenses; OD right eye; OS left eye; OU both eyes  XT exotropia; ET esotropia; PEK punctate epithelial keratitis; PEE punctate epithelial erosions; DES dry eye syndrome; MGD meibomian gland dysfunction; ATs artificial tears; PFAT's preservative free artificial tears; NSC nuclear sclerotic cataract; PSC posterior subcapsular cataract; ERM epi-retinal membrane; PVD posterior vitreous detachment; RD retinal detachment; DM diabetes mellitus; DR diabetic retinopathy; NPDR non-proliferative diabetic retinopathy; PDR proliferative diabetic retinopathy; CSME clinically significant macular edema; DME diabetic macular edema; dbh dot blot hemorrhages; CWS cotton wool spot; POAG primary open angle glaucoma; C/D cup-to-disc ratio; HVF humphrey visual field; GVF goldmann visual field; OCT optical coherence tomography; IOP intraocular pressure; BRVO Branch retinal vein occlusion; CRVO  central retinal vein occlusion; CRAO central retinal artery occlusion; BRAO branch retinal artery occlusion; RT retinal tear; SB scleral buckle; PPV pars plana vitrectomy; VH Vitreous hemorrhage; PRP panretinal laser photocoagulation; IVK intravitreal kenalog; VMT vitreomacular traction; MH Macular hole;  NVD  neovascularization of the disc; NVE neovascularization elsewhere; AREDS age related eye disease study; ARMD age related macular degeneration; POAG primary open angle glaucoma; EBMD epithelial/anterior basement membrane dystrophy; ACIOL anterior chamber intraocular lens; IOL intraocular lens; PCIOL posterior chamber intraocular lens; Phaco/IOL phacoemulsification with intraocular lens placement; PRK photorefractive keratectomy; LASIK laser assisted in situ keratomileusis; HTN hypertension; DM diabetes mellitus; COPD chronic obstructive pulmonary disease

## 2023-01-14 ENCOUNTER — Ambulatory Visit (INDEPENDENT_AMBULATORY_CARE_PROVIDER_SITE_OTHER): Payer: 59 | Admitting: Ophthalmology

## 2023-01-14 ENCOUNTER — Other Ambulatory Visit: Payer: Self-pay

## 2023-01-14 ENCOUNTER — Encounter (INDEPENDENT_AMBULATORY_CARE_PROVIDER_SITE_OTHER): Payer: Self-pay | Admitting: Ophthalmology

## 2023-01-14 DIAGNOSIS — H35033 Hypertensive retinopathy, bilateral: Secondary | ICD-10-CM

## 2023-01-14 DIAGNOSIS — H40053 Ocular hypertension, bilateral: Secondary | ICD-10-CM | POA: Diagnosis not present

## 2023-01-14 DIAGNOSIS — E113313 Type 2 diabetes mellitus with moderate nonproliferative diabetic retinopathy with macular edema, bilateral: Secondary | ICD-10-CM | POA: Diagnosis not present

## 2023-01-14 DIAGNOSIS — I10 Essential (primary) hypertension: Secondary | ICD-10-CM | POA: Diagnosis not present

## 2023-01-14 DIAGNOSIS — Z961 Presence of intraocular lens: Secondary | ICD-10-CM | POA: Diagnosis not present

## 2023-01-14 DIAGNOSIS — Z794 Long term (current) use of insulin: Secondary | ICD-10-CM | POA: Diagnosis not present

## 2023-01-14 DIAGNOSIS — Z7985 Long-term (current) use of injectable non-insulin antidiabetic drugs: Secondary | ICD-10-CM

## 2023-01-14 DIAGNOSIS — H35353 Cystoid macular degeneration, bilateral: Secondary | ICD-10-CM | POA: Diagnosis not present

## 2023-01-14 DIAGNOSIS — H04123 Dry eye syndrome of bilateral lacrimal glands: Secondary | ICD-10-CM

## 2023-01-14 MED ORDER — BRIMONIDINE TARTRATE 0.15 % OP SOLN
1.0000 [drp] | Freq: Two times a day (BID) | OPHTHALMIC | 10 refills | Status: DC
  Filled 2023-01-14: qty 10, 50d supply, fill #0
  Filled 2023-06-17: qty 10, 50d supply, fill #1

## 2023-01-14 MED ORDER — AFLIBERCEPT 2MG/0.05ML IZ SOLN FOR KALEIDOSCOPE
2.0000 mg | INTRAVITREAL | Status: AC | PRN
Start: 2023-01-14 — End: 2023-01-14
  Administered 2023-01-14: 2 mg via INTRAVITREAL

## 2023-01-14 MED ORDER — FARICIMAB-SVOA 6 MG/0.05ML IZ SOLN
6.0000 mg | INTRAVITREAL | Status: AC | PRN
Start: 2023-01-14 — End: 2023-01-14
  Administered 2023-01-14: 6 mg via INTRAVITREAL

## 2023-01-17 ENCOUNTER — Other Ambulatory Visit: Payer: Self-pay

## 2023-01-20 ENCOUNTER — Other Ambulatory Visit: Payer: Self-pay

## 2023-01-20 ENCOUNTER — Ambulatory Visit (HOSPITAL_BASED_OUTPATIENT_CLINIC_OR_DEPARTMENT_OTHER): Payer: 59 | Admitting: Family

## 2023-02-02 ENCOUNTER — Other Ambulatory Visit: Payer: Self-pay | Admitting: Internal Medicine

## 2023-02-02 ENCOUNTER — Other Ambulatory Visit: Payer: Self-pay

## 2023-02-02 ENCOUNTER — Other Ambulatory Visit: Payer: Self-pay | Admitting: General Practice

## 2023-02-02 DIAGNOSIS — E1165 Type 2 diabetes mellitus with hyperglycemia: Secondary | ICD-10-CM

## 2023-02-02 DIAGNOSIS — I2510 Atherosclerotic heart disease of native coronary artery without angina pectoris: Secondary | ICD-10-CM

## 2023-02-02 MED ORDER — LANTUS SOLOSTAR 100 UNIT/ML ~~LOC~~ SOPN
58.0000 [IU] | PEN_INJECTOR | Freq: Every day | SUBCUTANEOUS | 0 refills | Status: DC
  Filled 2023-02-02: qty 15, 25d supply, fill #0

## 2023-02-02 MED ORDER — NITROGLYCERIN 0.4 MG SL SUBL
SUBLINGUAL_TABLET | SUBLINGUAL | 3 refills | Status: DC
  Filled 2023-02-02: qty 25, 8d supply, fill #0
  Filled 2023-06-13: qty 25, 8d supply, fill #1
  Filled 2023-12-14: qty 25, 8d supply, fill #0
  Filled 2023-12-14: qty 25, 8d supply, fill #2
  Filled 2023-12-21: qty 25, 8d supply, fill #0
  Filled 2024-01-12: qty 25, 8d supply, fill #1

## 2023-02-03 ENCOUNTER — Other Ambulatory Visit: Payer: Self-pay

## 2023-02-08 ENCOUNTER — Other Ambulatory Visit: Payer: Self-pay

## 2023-02-08 ENCOUNTER — Other Ambulatory Visit: Payer: Self-pay | Admitting: Internal Medicine

## 2023-02-08 DIAGNOSIS — E1165 Type 2 diabetes mellitus with hyperglycemia: Secondary | ICD-10-CM

## 2023-02-13 ENCOUNTER — Other Ambulatory Visit: Payer: Self-pay | Admitting: Internal Medicine

## 2023-02-13 DIAGNOSIS — E1165 Type 2 diabetes mellitus with hyperglycemia: Secondary | ICD-10-CM

## 2023-02-15 NOTE — Telephone Encounter (Signed)
Requested medication (s) are due for refill today:   Yes  Requested medication (s) are on the active medication list:   Yes  Future visit scheduled:   No   Last ordered: 02/02/2023 15 ml, 0 refills  Unable to refill because A1C is due per protocol      Requested Prescriptions  Pending Prescriptions Disp Refills   insulin glargine (LANTUS SOLOSTAR) 100 UNIT/ML Solostar Pen 15 mL 0    Sig: Inject 58 Units into the skin daily.     Endocrinology:  Diabetes - Insulins Failed - 02/13/2023  7:24 PM      Failed - HBA1C is between 0 and 7.9 and within 180 days    HbA1c, POC (controlled diabetic range)  Date Value Ref Range Status  08/06/2022 8.2 (A) 0.0 - 7.0 % Final         Passed - Valid encounter within last 6 months    Recent Outpatient Visits           6 months ago Type 2 diabetes mellitus with morbid obesity (HCC)   [place] [facility] Jonah Blue B, MD   1 year ago Type 2 diabetes mellitus with morbid obesity [facility])   [place] [facility] Jonah Blue B, MD   1 year ago Type 2 diabetes mellitus with morbid obesity [facility])   [place] [facility] Marcine Matar, MD   1 year ago Erroneous encounter - disregard   [place] Jonah Blue B, MD   1 year ago Type 2 diabetes mellitus with morbid obesity [facility])   [place] [facility] Marcine Matar, MD

## 2023-02-18 ENCOUNTER — Encounter: Payer: Self-pay | Admitting: Pharmacist

## 2023-02-18 ENCOUNTER — Other Ambulatory Visit: Payer: Self-pay

## 2023-02-21 ENCOUNTER — Other Ambulatory Visit: Payer: Self-pay | Admitting: Internal Medicine

## 2023-02-21 DIAGNOSIS — Z794 Long term (current) use of insulin: Secondary | ICD-10-CM

## 2023-02-22 ENCOUNTER — Other Ambulatory Visit: Payer: Self-pay | Admitting: Internal Medicine

## 2023-02-22 DIAGNOSIS — Z794 Long term (current) use of insulin: Secondary | ICD-10-CM

## 2023-02-22 NOTE — Telephone Encounter (Signed)
Medication Refill - Medication: insulin glargine (LANTUS SOLOSTAR) 100 UNIT/ML Solostar Pen  Pt now has an appt for 11/12 at 2:10  Has the patient contacted their pharmacy? Yes and she saw in mychart that she needed an appt ( (Agent: If yes, when and what did the pharmacy advise?)contact pcp  Preferred Pharmacy (with phone number or street name): [facility] [address], [address], [place] [address] Phone: [phone]  Fax: [phone]  Has the patient been seen for an appointment in the last year OR does the patient have an upcoming appointment? yes  Agent: Please be advised that RX refills may take up to 3 business days. We ask that you follow-up with your pharmacy.

## 2023-02-22 NOTE — Telephone Encounter (Signed)
Requested medication (s) are due for refill today: Yes  Requested medication (s) are on the active medication list: Yes  Last refill:  02/02/23  Future visit scheduled: No  Notes to clinic:  Unable to refill per protocol, appointment needed.      Requested Prescriptions  Pending Prescriptions Disp Refills   insulin glargine (LANTUS SOLOSTAR) 100 UNIT/ML Solostar Pen 15 mL 0    Sig: Inject 58 Units into the skin daily.     Endocrinology:  Diabetes - Insulins Failed - 02/21/2023  2:41 PM      Failed - HBA1C is between 0 and 7.9 and within 180 days    HbA1c, POC (controlled diabetic range)  Date Value Ref Range Status  08/06/2022 8.2 (A) 0.0 - 7.0 % Final         Passed - Valid encounter within last 6 months    Recent Outpatient Visits           6 months ago Type 2 diabetes mellitus with morbid obesity (HCC)   [place] [facility] Jonah Blue B, MD   1 year ago Type 2 diabetes mellitus with morbid obesity [facility])   [place] [facility] Jonah Blue B, MD   1 year ago Type 2 diabetes mellitus with morbid [facility])   [place] [facility] Marcine Matar, MD   1 year ago Erroneous encounter - disregard   [place] [facility] Jonah Blue B, MD   1 year ago Type 2 diabetes mellitus with morbid obesity [facility])   [place] [facility] Marcine Matar, MD

## 2023-02-23 ENCOUNTER — Other Ambulatory Visit: Payer: Self-pay

## 2023-02-23 MED ORDER — LANTUS SOLOSTAR 100 UNIT/ML ~~LOC~~ SOPN
58.0000 [IU] | PEN_INJECTOR | Freq: Every day | SUBCUTANEOUS | 0 refills | Status: DC
Start: 2023-02-23 — End: 2023-03-30
  Filled 2023-02-23: qty 15, 25d supply, fill #0

## 2023-02-23 NOTE — Telephone Encounter (Signed)
Requested medication (s) are due for refill today: yes  Requested medication (s) are on the active medication list: yes  Last refill:  02/02/23 #27mL/0  Future visit scheduled: yes scheduled OV for 11/12  Notes to clinic:  pt asking for refill since scheduled upcoming OV      Requested Prescriptions  Pending Prescriptions Disp Refills   insulin glargine (LANTUS SOLOSTAR) 100 UNIT/ML Solostar Pen 15 mL 0    Sig: Inject 58 Units into the skin daily.     Endocrinology:  Diabetes - Insulins Failed - 02/22/2023  4:05 PM      Failed - HBA1C is between 0 and 7.9 and within 180 days    HbA1c, POC (controlled diabetic range)  Date Value Ref Range Status  08/06/2022 8.2 (A) 0.0 - 7.0 % Final         Passed - Valid encounter within last 6 months    Recent Outpatient Visits           6 months ago Type 2 diabetes mellitus with morbid obesity (HCC)   [place] [facility] Jonah Blue B, MD   1 year ago Type 2 diabetes mellitus with morbid obesity [facility])   [place] [facility] Jonah Blue B, MD   1 year ago Type 2 diabetes mellitus with morbid obesity [facility])   [place] [facility] Marcine Matar, MD   1 year ago Erroneous encounter - disregard   [place] [facility] Jonah Blue B, MD   1 year ago Type 2 diabetes mellitus with morbid obesity [facility])   [place] [facility] Marcine Matar, MD       Future Appointments             In 1 month Laural Benes, Binnie Rail, MD [facility]

## 2023-02-25 ENCOUNTER — Encounter (INDEPENDENT_AMBULATORY_CARE_PROVIDER_SITE_OTHER): Payer: 59 | Admitting: Ophthalmology

## 2023-02-25 DIAGNOSIS — H40053 Ocular hypertension, bilateral: Secondary | ICD-10-CM

## 2023-02-25 DIAGNOSIS — Z961 Presence of intraocular lens: Secondary | ICD-10-CM

## 2023-02-25 DIAGNOSIS — H35353 Cystoid macular degeneration, bilateral: Secondary | ICD-10-CM

## 2023-02-25 DIAGNOSIS — H35033 Hypertensive retinopathy, bilateral: Secondary | ICD-10-CM

## 2023-02-25 DIAGNOSIS — Z794 Long term (current) use of insulin: Secondary | ICD-10-CM

## 2023-02-25 DIAGNOSIS — E113313 Type 2 diabetes mellitus with moderate nonproliferative diabetic retinopathy with macular edema, bilateral: Secondary | ICD-10-CM

## 2023-02-25 DIAGNOSIS — H04123 Dry eye syndrome of bilateral lacrimal glands: Secondary | ICD-10-CM

## 2023-02-25 DIAGNOSIS — I10 Essential (primary) hypertension: Secondary | ICD-10-CM

## 2023-02-25 DIAGNOSIS — Z7985 Long-term (current) use of injectable non-insulin antidiabetic drugs: Secondary | ICD-10-CM

## 2023-02-28 ENCOUNTER — Other Ambulatory Visit: Payer: Self-pay

## 2023-02-28 ENCOUNTER — Other Ambulatory Visit: Payer: Self-pay | Admitting: Internal Medicine

## 2023-03-01 ENCOUNTER — Other Ambulatory Visit: Payer: Self-pay

## 2023-03-01 MED ORDER — INSULIN LISPRO (1 UNIT DIAL) 100 UNIT/ML (KWIKPEN)
28.0000 [IU] | PEN_INJECTOR | Freq: Two times a day (BID) | SUBCUTANEOUS | 3 refills | Status: DC
  Filled 2023-03-01: qty 15, 27d supply, fill #0
  Filled 2023-03-30: qty 15, 27d supply, fill #1
  Filled 2023-05-03 (×2): qty 15, 27d supply, fill #2
  Filled 2023-05-30: qty 15, 27d supply, fill #3

## 2023-03-01 NOTE — Telephone Encounter (Signed)
Requested Prescriptions  Pending Prescriptions Disp Refills   insulin lispro (HUMALOG KWIKPEN) 100 UNIT/ML KwikPen 15 mL 3    Sig: Inject 28 Units into the skin in the morning and at bedtime.     Endocrinology:  Diabetes - Insulins Failed - 02/28/2023 12:30 AM      Failed - HBA1C is between 0 and 7.9 and within 180 days    HbA1c, POC (controlled diabetic range)  Date Value Ref Range Status  08/06/2022 8.2 (A) 0.0 - 7.0 % Final         Passed - Valid encounter within last 6 months    Recent Outpatient Visits           6 months ago Type 2 diabetes mellitus with morbid obesity (HCC)   [place] [facility] Jonah Blue B, MD   1 year ago Type 2 diabetes mellitus with morbid obesity [facility])   [place] [facility] Jonah Blue B, MD   1 year ago Type 2 diabetes mellitus with morbid obesity [facility])   [place] [facility] Marcine Matar, MD   1 year ago Erroneous encounter - disregard   [place] [facility] Jonah Blue B, MD   1 year ago Type 2 diabetes mellitus with morbid obesity [facility])   [place] [facility] Marcine Matar, MD       Future Appointments             In 1 month Laural Benes, Binnie Rail, MD [facility]

## 2023-03-01 NOTE — Progress Notes (Signed)
[facility] - Clinic Note  03/02/2023     CHIEF COMPLAINT Patient presents for Retina Follow Up   HISTORY OF PRESENT ILLNESS: Kimberly Frazier is a 62 y.o. female who presents to the clinic today for:   HPI     Retina Follow Up   Patient presents with  Diabetic Retinopathy.  In both eyes.  This started 7 weeks ago.  I, the attending physician,  performed the HPI with the patient and updated documentation appropriately.        Comments   Patient here for 7 weeks retina follow up for NPDR OU. Patient states vision doing ok. No eye pain. Using drops.      Last edited by Rennis Chris, MD on 03/02/2023 10:38 PM.     Patient feels that vision is the same.   Referring physician: Marcine Matar, MD [address] [place],  [place] [address]  HISTORICAL INFORMATION:   Selected notes from the MEDICAL RECORD NUMBER Referred by Dr. Jethro Bolus for concern of CME    CURRENT MEDICATIONS: Current Outpatient Medications (Ophthalmic Drugs)  Medication Sig   brimonidine (ALPHAGAN) 0.15 % ophthalmic solution Place 1 drop into both eyes 2 (two) times daily.   brimonidine (ALPHAGAN) 0.2 % ophthalmic solution Place 1 drop into both eyes 2 (two) times daily.   dorzolamide-timolol (COSOPT) 2-0.5 % ophthalmic solution Place 1 drop into both eyes 2 (two) times daily.   ketorolac (ACULAR) 0.5 % ophthalmic solution Place 1 drop into both eyes 4 (four) times daily.   prednisoLONE acetate (PRED FORTE) 1 % ophthalmic suspension Place 1 drop into both eyes daily.   No current facility-administered medications for this visit. (Ophthalmic Drugs)   Current Outpatient Medications (Other)  Medication Sig   Accu-Chek Softclix Lancets lancets Use to check blood sugar 3 times daily.   amLODipine (NORVASC) 10 MG tablet Take 1 tablet (10 mg total) by mouth daily.   aspirin EC 81 MG tablet Take 1 tablet (81 mg total) daily by mouth.   atorvastatin (LIPITOR) 80 MG tablet Take  1 tablet (80 mg total) by mouth once daily. (PLEASE SCHEDULE AN APPT FOR FUTURE REFILLS)   Blood Glucose Monitoring Suppl (ACCU-CHEK GUIDE) w/Device KIT 1 kit by Does not apply route in the morning, at noon, and at bedtime. Use to check blood sugar 3 times daily.   carvedilol (COREG) 25 MG tablet TAKE 1 TABLET (25 MG TOTAL) BY MOUTH 2 (TWO) TIMES DAILY WITH A MEAL.   Continuous Blood Gluc Receiver (FREESTYLE LIBRE 2 READER) DEVI Use as directed   Continuous Blood Gluc Sensor (FREESTYLE LIBRE SENSOR SYSTEM) MISC Change sensor every 2 weeks   diclofenac Sodium (VOLTAREN) 1 % GEL Apply 2 g topically 2 (two) times daily as needed.   ezetimibe (ZETIA) 10 MG tablet Take 1 tablet (10 mg total) by mouth daily.   glucose blood (ACCU-CHEK GUIDE) test strip Use to check blood sugar 3 times daily.   insulin glargine (LANTUS SOLOSTAR) 100 UNIT/ML Solostar Pen Inject 58 Units into the skin daily.   insulin lispro (HUMALOG KWIKPEN) 100 UNIT/ML KwikPen Inject 28 Units into the skin in the morning and at bedtime.   Insulin Pen Needle (BD PEN NEEDLE NANO U/F) 32G X 4 MM MISC Use as directed.   isosorbide mononitrate (IMDUR) 60 MG 24 hr tablet Take 1 tablet (60 mg total) by mouth daily.   Na Sulfate-K Sulfate-Mg Sulf 17.5-3.13-1.6 GM/177ML SOLN Suprep (no substitutions)-TAKE AS DIRECTED.  nitroGLYCERIN (NITROSTAT) 0.4 MG SL tablet Place 1 tab under tongue for chest pain.  May repeat after 5 minutes x 2.  DO NOT TAKE MORE THAN 3 TABS DURING AN EPISODE OF CHEST PAIN   potassium chloride (KLOR-CON) 10 MEQ tablet TAKE 2 TABLETS BY MOUTH DAILY   Semaglutide,0.25 or 0.5MG /DOS, (OZEMPIC, 0.25 OR 0.5 MG/DOSE,) 2 MG/3ML SOPN Inject 0.5 mg into the skin once a week.   No current facility-administered medications for this visit. (Other)   REVIEW OF SYSTEMS: ROS   Positive for: Endocrine, Cardiovascular, Eyes Negative for: Constitutional, Gastrointestinal, Neurological, Skin, Genitourinary, Musculoskeletal, HENT,  Respiratory, Psychiatric, Allergic/Imm, Heme/Lymph Last edited by Laddie Aquas, COA on 03/02/2023  9:36 AM.      ALLERGIES Allergies  Allergen Reactions   Jardiance [Empagliflozin] Other (See Comments)    Frequent yeast infection   PAST MEDICAL HISTORY Past Medical History:  Diagnosis Date   Adrenal adenoma, left    Arthritis    Cancer (HCC)    CKD (chronic kidney disease), stage II    Coronary artery disease 07-28-2018 followed by pcp([facility])  currently due to no insurance   per cardiac cath 02-06-2015 (positive mild lateral ishcemia on stress test)--- dLAD 80%,  mLAD 40%,  mPDA 80% (small vessel),  ostial D1 70%,  CFx with lumial irregarlities-- medical management   Depression    Diabetic neuropathy (HCC)    Diabetic retinopathy (HCC)    NPDR OU   Headache    History of Bell's palsy 07/2011   per pt residual facial pain on left side occasionally   History of cancer of vagina 1999   per pt completed radiation and chemo   History of sepsis 12/30/2017   positive blood culter fro E.coli   Hyperlipidemia    Hypertension    Hypertensive retinopathy    OU   Hypokalemia    Insulin dependent type 2 diabetes mellitus, uncontrolled    followed by pcp---  A1c was 11.7 on 06-15-2018 in epic   Myocardial infarction [facility])    10 yrs ago?   Nocturia    Peripheral neuropathy    PMB (postmenopausal bleeding)    Wears dentures    upper   Wears glasses    Past Surgical History:  Procedure Laterality Date   BREAST BIOPSY  2012   benign   CARDIAC CATHETERIZATION N/A 02/06/2015   Procedure: Left Heart Cath and Coronary Angiography;  Surgeon: Laurey Morale, MD;  Location: [facility] INVASIVE CV LAB;  Service: Cardiovascular;  Laterality: N/A;   CATARACT EXTRACTION Bilateral OD: 03/07/19, OS: 02/28/19   Dr. Nile Riggs   CATARACT EXTRACTION, BILATERAL     COLONOSCOPY     normal exam in [place], [place] abour 10-12 years ago   EYE SURGERY Bilateral OD: 03/07/19, OS: 02/28/19    Cat Sx - Dr. Nile Riggs   HYSTEROSCOPY WITH D & C N/A 08/01/2018   Procedure: DILATATION AND CURETTAGE /HYSTEROSCOPY;  Surgeon: Catalina Antigua, MD;  Location: [facility];  Service: Gynecology;  Laterality: N/A;   ROBOTIC ASSISTED TOTAL HYSTERECTOMY WITH BILATERAL SALPINGO OOPHERECTOMY N/A 11/28/2018   Procedure: XI ROBOTIC ASSISTED TOTAL HYSTERECTOMY WITH BILATERAL SALPINGO OOPHORECTOMY;  Surgeon: Adolphus Birchwood, MD;  Location: [facility];  Service: Gynecology;  Laterality: N/A;   SENTINEL NODE BIOPSY N/A 11/28/2018   Procedure: SENTINEL NODE BIOPSY;  Surgeon: Adolphus Birchwood, MD;  Location: [facility];  Service: Gynecology;  Laterality: N/A;   UMBILICAL HERNIA REPAIR  child   FAMILY HISTORY Family History  Problem Relation Age of Onset   Heart disease Mother    Hypertension Mother    Diabetes Mother    Cancer Father        hodgkins lymphoma   Heart disease Sister        heart attack   Diabetes Sister    Colon cancer Brother 38   Diabetes Maternal Aunt    Diabetes Maternal Uncle    Cancer Other        parent   Diabetes Other        parent   Heart disease Other        parent   Hyperlipidemia Other        parent   Hypertension Other        parent   Arthritis Other        parent   Breast cancer Neg Hx    Colon polyps Neg Hx    Esophageal cancer Neg Hx    Rectal cancer Neg Hx    Stomach cancer Neg Hx    SOCIAL HISTORY Social History   Tobacco Use   Smoking status: Never   Smokeless tobacco: Never  Vaping Use   Vaping status: Never Used  Substance Use Topics   Alcohol use: Not Currently   Drug use: Not Currently    Types: Marijuana    Comment: every 2 weeks per pt       OPHTHALMIC EXAM:  Base Eye Exam     Visual Acuity (Snellen - Linear)       Right Left   Dist [place] 20/20 -1 20/200 -2   Dist ph [place]  20/200 +2         Tonometry (Tonopen, 9:34 AM)       Right Left   Pressure 13 12         Pupils       Dark Light Shape React APD   Right 3 2 Round  Brisk None   Left 3 2 Round Brisk None         Visual Fields (Counting fingers)       Left Right    Full Full         Extraocular Movement       Right Left    Full, Ortho Full, Ortho         Neuro/Psych     Oriented x3: Yes   Mood/Affect: Normal         Dilation     Both eyes: 1.0% Mydriacyl, 2.5% Phenylephrine @ 9:34 AM           Slit Lamp and Fundus Exam     Slit Lamp Exam       Right Left   Lids/Lashes Dermatochalasis - upper lid Dermatochalasis - upper lid   Conjunctiva/Sclera Mild Melanosis Mild Melanosis   Cornea 1+ Punctate epithelial erosions 2+ central punctate epithelial erosions, decreased TBUT, mild tear film debris, well healed temporal cataract wounds   Anterior Chamber Deep, 0.5+cell/pigment Deep, 0.5+cell/pigment   Iris Round and dilated, No NVI Round and dilated, No NVI   Lens PC IOL in good position with trace PCO PC IOL in good position with 1+PCO   Anterior Vitreous Vitreous syneresis, Posterior vitreous detachment Vitreous syneresis, Posterior vitreous detachment, vitreous condensations         Fundus Exam       Right Left   Disc Pink and Sharp Compact, Pink and Sharp, temporal PPA   C/D Ratio 0.1 0.1  Macula Good foveal reflex, persistent cystic changes / edema inferior macula -- slightly improved, minimal Microaneurysms Blunted foveal reflex, central edema - slightly improved, +exudates/MA/IRH -- greatest temporal macula -- stably improved, +central atrophy, no heme   Vessels attenuated, Tortuous attenuated, Tortuous   Periphery Attached, scattered drusen, rare, scattered DBH, reticular degeneration Attached, scattered drusen, scattered DBH--greatest nasal to disc, reticular degeneration           IMAGING AND PROCEDURES  Imaging and Procedures for 03/02/2023  OCT, Retina - OU - Both Eyes       Right Eye Quality was good. Central Foveal Thickness: 258. Progression has improved. Findings include no SRF, abnormal foveal  contour, intraretinal hyper-reflective material, intraretinal fluid, vitreomacular adhesion (Persistent IRF/cystic changes inferior macula and fovea -- slightly improved).   Left Eye Quality was good. Central Foveal Thickness: 212. Progression has improved. Findings include no SRF, abnormal foveal contour, retinal drusen , subretinal hyper-reflective material, intraretinal hyper-reflective material, intraretinal fluid, outer retinal atrophy, vitreomacular adhesion (Mild interval improvement in IRF/IRHM inferior and temporal mac and fovea, +central ORA).   Notes *Images captured and stored on drive  Diagnosis / Impression:  Macular edema OU - likely combination of CME + DME OU OD: Persistent IRF/cystic changes inferior macula and fovea -- slightly improved OS: interval improvement in IRF/IRHM inferior and temporal mac and fovea, +central ORA  Clinical management:  See below  Abbreviations: NFP - Normal foveal profile. CME - cystoid macular edema. PED - pigment epithelial detachment. IRF - intraretinal fluid. SRF - subretinal fluid. EZ - ellipsoid zone. ERM - epiretinal membrane. ORA - outer retinal atrophy. ORT - outer retinal tubulation. SRHM - subretinal hyper-reflective material      Intravitreal Injection, Pharmacologic Agent - OD - Right Eye       Time Out 03/02/2023. 11:34 AM. Confirmed correct patient, procedure, site, and patient consented.   Anesthesia Topical anesthesia was used. Anesthetic medications included Lidocaine 2%, Proparacaine 0.5%.   Procedure Preparation included 5% betadine to ocular surface, eyelid speculum. A (32g) needle was used.   Injection: 2 mg aflibercept 2 MG/0.05ML   Route: Intravitreal, Site: Right Eye   NDC: L6038910, Lot: [id number], Expiration date: 03/30/2024, Waste: 0 mL   Post-op Post injection exam found visual acuity of at least counting fingers. The patient tolerated the procedure well. There were no complications. The patient  received written and verbal post procedure care education.      Intravitreal Injection, Pharmacologic Agent - OS - Left Eye       Time Out 03/02/2023. 11:35 AM. Confirmed correct patient, procedure, site, and patient consented.   Anesthesia Topical anesthesia was used. Anesthetic medications included Lidocaine 2%, Proparacaine 0.5%.   Procedure Preparation included 5% betadine to ocular surface, eyelid speculum. A (32g) needle was used.   Injection: 6 mg faricimab-svoa 6 MG/0.05ML   Route: Intravitreal, Site: Left Eye   NDC: O8010301, Lot: [id number], Expiration date: 01/28/2025, Waste: 0 mL   Post-op Post injection exam found visual acuity of at least counting fingers. The patient tolerated the procedure well. There were no complications. The patient received written and verbal post procedure care education. Post injection medications were not given.            ASSESSMENT/PLAN:    ICD-10-CM   1. Moderate nonproliferative diabetic retinopathy of both eyes with macular edema associated with type 2 diabetes mellitus (HCC)  E11.3313 OCT, Retina - OU - Both Eyes    Intravitreal Injection, Pharmacologic Agent - OD - Right  Eye    Intravitreal Injection, Pharmacologic Agent - OS - Left Eye    faricimab-svoa (VABYSMO) 6mg /0.28mL intravitreal injection    aflibercept (EYLEA) SOLN 2 mg    2. Current use of insulin (HCC)  Z79.4     3. Long-term (current) use of injectable non-insulin antidiabetic drugs  Z79.85     4. CME (cystoid macular edema), bilateral  H35.353     5. Essential hypertension  I10     6. Hypertensive retinopathy of both eyes  H35.033     7. Pseudophakia of both eyes  Z96.1     8. 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 Dry eyes, bilateral  H04.123       1-4. Moderate Non-proliferative diabetic retinopathy, OU OD with combination DME/CME  - last A1c 8.2 on 03.08.24; 9.6 on 08.07.23, 10.1 on 1.31.23  - pt now on Ozempic as of Mar 2024 - h/o  delayed to follow up from September 2021 to April 2022--7 mos instead of 4 wks**             - exam shows scattered IRH, edema and tortuous blood vessels OU - FA (10.23.20) shows leaking MA OU and paracentral petaloid hyperfluorecence OD --> CME/[place] - s/p IVA OD #1 (11.06.20), #2 (12.18.20), #3 (02.24.21), #4 (03.24.21), #5 (04.21.21), #6 (5.26.21), #7 (07.26.21), #8 (08.25.21), #9 (09.23.21), #10 (04.26.22) -- IVA resistance - s/p IVA OS #1 (10.23.20), #2 (11.18.20), #3 (12.18.20), #4 (02.24.21), #5 (03.24.21) -- IVA resistance  ======================================================== - s/p IVE OD #1 (05.25.22), #2 (07.01.22), #3 (07.29.22), #4 (08.29.22 - JDM), #5 (09.26.22), #6 (10.24.22), #7 (11.21.22), #8 (12.19.22), #9 (01.23.23), #10 (02.20.23), #11 (03.22.23), #12 (04.19.23), #13 (05.17.23), #14 (06.14.23), #15 (08.04.23), #16 (09.01.23), #17 (10.10.23), #18 (11.14.23), #19 (12.19.23), #20 (02.13.24), #21 (03.20.24), #22 (03.20.24), #23 (03.20.24), #24 (04.24.24) #25(06.28.24), #26 (08.16.24) - s/p IVE OS #1 (04.21.21), #2 (5.26.21), #3 (07.26.21), #4 (08.25.21), #5 (09.23.21), #6 (04.26.22), #7 (05.25.22), #8 (07.01.22), #9 (07.29.22), #10 (08.29.22 - JDM), #11 (09.26.22), #12 (10.24.22) -- IVE resistance ======================================================== - s/p IV Vabysmo OS #1 (11.21.22, sample), #2 (12.19.22), #3 (01.23.23), #4 (02.20.23), #5 (03.22.23), #6 (04.19.23), #7 (05.17.23), #8 (06.14.23, sample), #9 (08.04.23), #10 (9.01.23), #11 (10.10.23), #12 (11.14.23), #13 (12.19.23), #14 (02.13.24), #15 (03.20.24) #16(3.20.24), #17 (03.20.24), #18 (04.24.24) #19(06.28.24), #20 (08.16.24) - BCVA OD 20/20; OS 20/200  - OCT today: OD: Persistent IRF/cystic changes inferior macula and fovea -- slightly improved  OS: interval improvement in IRF/IRHM inferior and temporal mac and fovea, +central ORA at 7 weeks - recommend IVE OD #27 and IVV OS #21 today, 10.02.24 with follow up in 7  weeks again  - pt wishes to proceed w/ injections             - RBA of procedure discussed, questions answered  - Eylea OU informed consent form re-signed and scanned on 8.16.24 (OD) - Vabysmo OS consent form signed and scanned on 11.21.23 - see procedure note             - continue Ketorolac QID OU             - f/u in 7 weeks, DFE, OCT, possible injection OU   5,6. Hypertensive retinopathy OU             - discussed importance of tight BP control             - continue to monitor   7. Pseudophakia OU            -  s/p CE/IOL OU Nile Riggs)                        OD: 03/07/2019                        OS: 02/28/2019             - IOLs in good position             - CME OU as above             - continue to monitor   8. Ocular Hypertension OU             - IOP: 13,12 - h/o steroid response             - cont Cosopt BID OU and Brimonidine BID OU  9. Dry eyes OU - recommend artificial tears and lubricating ointment as needed   Ophthalmic Meds Ordered this visit:  Meds ordered this encounter  Medications   faricimab-svoa (VABYSMO) 6mg /0.47mL intravitreal injection   aflibercept (EYLEA) SOLN 2 mg     Return in about 7 weeks (around 04/20/2023) for f/u NPDR OU, DFE, OCT.  There are no Patient Instructions on file for this visit.  This document serves as a record of services personally performed by Karie Chimera, MD, PhD. It was created on their behalf by De Blanch, an ophthalmic technician. The creation of this record is the provider's dictation and/or activities during the visit.    Electronically signed by: De Blanch, OA, 03/02/23  10:39 PM  This document serves as a record of services personally performed by Karie Chimera, MD, PhD. It was created on their behalf by Glee Arvin. Manson Passey, OA an ophthalmic technician. The creation of this record is the provider's dictation and/or activities during the visit.    Electronically signed by: Glee Arvin. Manson Passey, OA 03/02/23  10:39 PM  Karie Chimera, M.D., Ph.D. Diseases & Surgery of the Retina and Vitreous [facility] 03/02/2023   I have reviewed the above documentation for accuracy and completeness, and I agree with the above. Karie Chimera, M.D., Ph.D. 03/02/23 10:40 PM  Abbreviations: M myopia (nearsighted); A astigmatism; H hyperopia (farsighted); P presbyopia; Mrx spectacle prescription;  CTL contact lenses; OD right eye; OS left eye; OU both eyes  XT exotropia; ET esotropia; PEK punctate epithelial keratitis; PEE punctate epithelial erosions; DES dry eye syndrome; MGD meibomian gland dysfunction; ATs artificial tears; PFAT's preservative free artificial tears; NSC nuclear sclerotic cataract; PSC posterior subcapsular cataract; ERM epi-retinal membrane; PVD posterior vitreous detachment; RD retinal detachment; DM diabetes mellitus; DR diabetic retinopathy; NPDR non-proliferative diabetic retinopathy; PDR proliferative diabetic retinopathy; CSME clinically significant macular edema; DME diabetic macular edema; dbh dot blot hemorrhages; CWS cotton wool spot; POAG primary open angle glaucoma; C/D cup-to-disc ratio; HVF humphrey visual field; GVF goldmann visual field; OCT optical coherence tomography; IOP intraocular pressure; BRVO Branch retinal vein occlusion; CRVO central retinal vein occlusion; CRAO central retinal artery occlusion; BRAO branch retinal artery occlusion; RT retinal tear; SB scleral buckle; PPV pars plana vitrectomy; VH Vitreous hemorrhage; PRP panretinal laser photocoagulation; IVK intravitreal kenalog; VMT vitreomacular traction; MH Macular hole;  NVD neovascularization of the disc; NVE neovascularization elsewhere; AREDS age related eye disease study; ARMD age related macular degeneration; POAG primary open angle glaucoma; EBMD epithelial/anterior basement membrane dystrophy; ACIOL anterior chamber intraocular lens; IOL intraocular lens; PCIOL posterior chamber intraocular  lens; Phaco/IOL phacoemulsification with intraocular lens placement; PRK photorefractive keratectomy; LASIK laser assisted in situ keratomileusis; HTN hypertension; DM diabetes mellitus; COPD chronic obstructive pulmonary disease

## 2023-03-02 ENCOUNTER — Ambulatory Visit (INDEPENDENT_AMBULATORY_CARE_PROVIDER_SITE_OTHER): Payer: 59 | Admitting: Ophthalmology

## 2023-03-02 ENCOUNTER — Encounter (INDEPENDENT_AMBULATORY_CARE_PROVIDER_SITE_OTHER): Payer: Self-pay | Admitting: Ophthalmology

## 2023-03-02 ENCOUNTER — Other Ambulatory Visit: Payer: Self-pay

## 2023-03-02 DIAGNOSIS — H04123 Dry eye syndrome of bilateral lacrimal glands: Secondary | ICD-10-CM | POA: Diagnosis not present

## 2023-03-02 DIAGNOSIS — I10 Essential (primary) hypertension: Secondary | ICD-10-CM | POA: Diagnosis not present

## 2023-03-02 DIAGNOSIS — H40053 Ocular hypertension, bilateral: Secondary | ICD-10-CM

## 2023-03-02 DIAGNOSIS — Z961 Presence of intraocular lens: Secondary | ICD-10-CM

## 2023-03-02 DIAGNOSIS — H35353 Cystoid macular degeneration, bilateral: Secondary | ICD-10-CM

## 2023-03-02 DIAGNOSIS — Z794 Long term (current) use of insulin: Secondary | ICD-10-CM

## 2023-03-02 DIAGNOSIS — H35033 Hypertensive retinopathy, bilateral: Secondary | ICD-10-CM | POA: Diagnosis not present

## 2023-03-02 DIAGNOSIS — E113313 Type 2 diabetes mellitus with moderate nonproliferative diabetic retinopathy with macular edema, bilateral: Secondary | ICD-10-CM | POA: Diagnosis not present

## 2023-03-02 DIAGNOSIS — Z7985 Long-term (current) use of injectable non-insulin antidiabetic drugs: Secondary | ICD-10-CM

## 2023-03-02 LAB — HM DIABETES EYE EXAM

## 2023-03-02 MED ORDER — AFLIBERCEPT 2MG/0.05ML IZ SOLN FOR KALEIDOSCOPE
2.0000 mg | INTRAVITREAL | Status: AC | PRN
Start: 2023-03-02 — End: 2023-03-02
  Administered 2023-03-02: 2 mg via INTRAVITREAL

## 2023-03-02 MED ORDER — FARICIMAB-SVOA 6 MG/0.05ML IZ SOLN
6.0000 mg | INTRAVITREAL | Status: AC | PRN
Start: 2023-03-02 — End: 2023-03-02
  Administered 2023-03-02: 6 mg via INTRAVITREAL

## 2023-03-03 ENCOUNTER — Telehealth: Payer: Self-pay

## 2023-03-03 NOTE — Telephone Encounter (Signed)
VMT pt requesting call back reference starting PREP in the next class starting 03/21/23

## 2023-03-09 ENCOUNTER — Other Ambulatory Visit: Payer: Self-pay

## 2023-03-11 ENCOUNTER — Other Ambulatory Visit: Payer: Self-pay

## 2023-03-30 ENCOUNTER — Other Ambulatory Visit: Payer: Self-pay

## 2023-03-30 ENCOUNTER — Other Ambulatory Visit: Payer: Self-pay | Admitting: Internal Medicine

## 2023-03-30 DIAGNOSIS — E1165 Type 2 diabetes mellitus with hyperglycemia: Secondary | ICD-10-CM

## 2023-03-30 MED ORDER — LANTUS SOLOSTAR 100 UNIT/ML ~~LOC~~ SOPN
58.0000 [IU] | PEN_INJECTOR | Freq: Every day | SUBCUTANEOUS | 0 refills | Status: DC
  Filled 2023-03-30: qty 15, 25d supply, fill #0

## 2023-03-30 MED ORDER — OZEMPIC (0.25 OR 0.5 MG/DOSE) 2 MG/3ML ~~LOC~~ SOPN
0.5000 mg | PEN_INJECTOR | SUBCUTANEOUS | 0 refills | Status: DC
Start: 2023-03-30 — End: 2023-04-12
  Filled 2023-03-30: qty 3, 28d supply, fill #0

## 2023-04-01 ENCOUNTER — Other Ambulatory Visit: Payer: Self-pay

## 2023-04-12 ENCOUNTER — Other Ambulatory Visit (HOSPITAL_COMMUNITY)
Admission: RE | Admit: 2023-04-12 | Discharge: 2023-04-12 | Disposition: A | Discharge status: Home / Self Care | Payer: 59 | Source: Ambulatory Visit | Attending: Internal Medicine | Admitting: Internal Medicine

## 2023-04-12 ENCOUNTER — Other Ambulatory Visit: Payer: Self-pay

## 2023-04-12 ENCOUNTER — Ambulatory Visit: Payer: 59 | Attending: Internal Medicine | Admitting: Internal Medicine

## 2023-04-12 DIAGNOSIS — Z794 Long term (current) use of insulin: Secondary | ICD-10-CM | POA: Diagnosis not present

## 2023-04-12 DIAGNOSIS — E1159 Type 2 diabetes mellitus with other circulatory complications: Secondary | ICD-10-CM

## 2023-04-12 DIAGNOSIS — I152 Hypertension secondary to endocrine disorders: Secondary | ICD-10-CM

## 2023-04-12 DIAGNOSIS — N898 Other specified noninflammatory disorders of vagina: Secondary | ICD-10-CM | POA: Insufficient documentation

## 2023-04-12 DIAGNOSIS — Z7985 Long-term (current) use of injectable non-insulin antidiabetic drugs: Secondary | ICD-10-CM | POA: Diagnosis not present

## 2023-04-12 DIAGNOSIS — M25511 Pain in right shoulder: Secondary | ICD-10-CM | POA: Diagnosis not present

## 2023-04-12 DIAGNOSIS — G8929 Other chronic pain: Secondary | ICD-10-CM | POA: Diagnosis not present

## 2023-04-12 DIAGNOSIS — M25562 Pain in left knee: Secondary | ICD-10-CM

## 2023-04-12 DIAGNOSIS — Z1231 Encounter for screening mammogram for malignant neoplasm of breast: Secondary | ICD-10-CM

## 2023-04-12 DIAGNOSIS — E1169 Type 2 diabetes mellitus with other specified complication: Secondary | ICD-10-CM

## 2023-04-12 LAB — GLUCOSE, POCT (MANUAL RESULT ENTRY): POC Glucose: 133 mg/dL — AB (ref 70–99)

## 2023-04-12 LAB — POCT GLYCOSYLATED HEMOGLOBIN (HGB A1C): HbA1c, POC (controlled diabetic range): 8.7 % — AB (ref 0.0–7.0)

## 2023-04-12 MED ORDER — TRAMADOL HCL 50 MG PO TABS
50.0000 mg | ORAL_TABLET | Freq: Two times a day (BID) | ORAL | 0 refills | Status: AC | PRN
  Filled 2023-04-12: qty 15, 7d supply, fill #0

## 2023-04-12 MED ORDER — CARVEDILOL 25 MG PO TABS
25.0000 mg | ORAL_TABLET | Freq: Two times a day (BID) | ORAL | 1 refills | Status: DC
Start: 2023-04-12 — End: 2023-12-06
  Filled 2023-04-12: qty 180, 90d supply, fill #0
  Filled 2023-07-12 – 2023-10-19 (×2): qty 180, 90d supply, fill #1

## 2023-04-12 MED ORDER — OZEMPIC (0.25 OR 0.5 MG/DOSE) 2 MG/3ML ~~LOC~~ SOPN
0.5000 mg | PEN_INJECTOR | SUBCUTANEOUS | 3 refills | Status: DC
  Filled 2023-04-12 – 2023-05-03 (×2): qty 6, 56d supply, fill #0
  Filled 2023-05-03: qty 3, 28d supply, fill #0
  Filled 2023-05-30 – 2023-06-25 (×3): qty 6, 56d supply, fill #1

## 2023-04-12 MED ORDER — ATORVASTATIN CALCIUM 80 MG PO TABS
80.0000 mg | ORAL_TABLET | Freq: Every day | ORAL | 1 refills | Status: DC
Start: 2023-04-12 — End: 2023-12-06
  Filled 2023-04-12: qty 90, 90d supply, fill #0
  Filled 2023-06-13 – 2023-10-19 (×2): qty 90, 90d supply, fill #1

## 2023-04-12 MED ORDER — LANTUS SOLOSTAR 100 UNIT/ML ~~LOC~~ SOPN
58.0000 [IU] | PEN_INJECTOR | Freq: Every day | SUBCUTANEOUS | 6 refills | Status: DC
  Filled 2023-04-12 – 2023-05-03 (×3): qty 15, 25d supply, fill #0
  Filled 2023-05-30: qty 15, 25d supply, fill #1
  Filled 2023-06-25: qty 15, 25d supply, fill #2
  Filled 2023-07-07 – 2023-07-21 (×4): qty 15, 25d supply, fill #3

## 2023-04-12 NOTE — Patient Instructions (Addendum)
You will be called with the appointment to see the orthopedics. Go to [facility] imaging at [employer] [employer]. to have x-rays of the shoulder.  Check your blood sugars at least twice a day before meals.  Bring your readings with you in 2 weeks to see our clinical pharmacist.  Bring the continuous glucose monitor with you as well so that you can be shown how to use it.

## 2023-04-12 NOTE — Progress Notes (Signed)
Patient ID: Kimberly Frazier, female    DOB: 1960/12/02  MRN: [id number]  CC: Diabetes (DM f/u. Med refills. Ottis Stain on R arm - Tylenol & Voltaren not helping/Lower back pain, vaginal odor X 2-3 mo/No to flu vax. Yes to mammogram referral. )   Subjective: Kimberly Frazier is a 62 y.o. female who presents for chronic ds management. Her concerns today include:  Patient with history of DM with neuropathy and retinopathy, HTN with hypertensive heart disease, CAD (Coronary CT revealed occlusion in the mid to distal LAD, distal RCA.  Cardiology states that she will need CABG eventually), HL, obesity, endometrial CA s/p hysterectomy   Discussed the use of AI scribe software for clinical note transcription with the patient, who gave verbal consent to proceed.  History of Present Illness   The patient, with a history of diabetes, hypertension, and hyperlipidemia, presents with a complaint of vaginal odor for the past three months. She denies any associated discharge but reports a light burning sensation during urination that started about six weeks ago and has since resolved.  The patient also reports severe right arm pain, located below the shoulder and in the upper arm. The pain has been present for over 4 mths and has been progressively worsening. The pain is described as constant and severe, exacerbated by movement. The patient has been managing the pain with Tylenol and Voltaren gel, but reports minimal relief.  In addition, the patient has poorly controlled diabetes with an A1c of 8.7.  She should be on Lantus 58 units daily.  Reports she had been out of Lantus for about a month or 2 and just got it about 1 month ago.  Takes 50 to 60 units depending on what her blood sugar does.  Should be on Humalog 28 units with breakfast and dinner.  When she was out of the Lantus, she was taking up to 50 units of Humalog twice a day to compensate.  Should be on Ozempic 0.5 mg once a week.  However she tells me she does  this 0.25 and dials 3 notch below that.  She is tolerating the Ozempic but has not had any significant weight loss.  She does not feel it has decreased her appetite so far.  Not checking blood sugars consistently.  Has continuous glucose monitor but not sure how to use it.  Feels she can do better with her eating habits. The patient also mentions back pain and knee pain, which has limited her ability to exercise. She reports difficulty walking due to LT knee pain.     HTN/CAD/HL   She reports compliance with carvedilol 25 mg twice a day, amlodipine 10 mg daily, isosorbide 60 mg daily, atorvastatin 80 mg daily, Zetia 10 mg daily and aspirin 81 mg daily.  Last LDL 62. No chest pains or shortness of breath.  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   History of radiation therapy 11/28/2018   Diabetes mellitus (HCC) 10/06/2018   Type  2 diabetes mellitus with hyperglycemia, with long-term current use of insulin (HCC) 10/06/2018   Adrenal adenoma, left 06/15/2018   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   Postmenopausal vaginal bleeding 06/15/2018   Hyperkalemia    Diabetic peripheral neuropathy (HCC) 09/03/2016   CAD (coronary artery disease) 04/10/2015   Severe obesity (BMI >= 40) (HCC) 09/20/2013   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 100 each 6   amLODipine (NORVASC) 10 MG tablet Take 1 tablet (10 mg total) by mouth daily. 90 tablet 1   aspirin EC 81 MG tablet Take 1 tablet (81 mg total) daily by mouth. 100 tablet 1   Blood Glucose Monitoring Suppl (ACCU-CHEK GUIDE) w/Device KIT 1 kit by Does not apply route in the morning, at noon, and at bedtime. Use to check blood sugar 3 times daily. 1 kit 0   brimonidine (ALPHAGAN) 0.15 % ophthalmic solution Place 1 drop into both eyes 2 (two) times daily. 10 mL 10   brimonidine (ALPHAGAN) 0.2 % ophthalmic solution Place 1 drop into both eyes 2 (two) times daily. 10 mL 6   Continuous Blood Gluc Receiver (FREESTYLE LIBRE 2 READER) DEVI Use as directed 1 each 0   Continuous Blood Gluc Sensor (FREESTYLE LIBRE SENSOR SYSTEM) MISC Change sensor every 2 weeks 2 each 12   diclofenac Sodium (VOLTAREN) 1 % GEL Apply 2 g topically 2 (two) times daily as needed. 100 g 1   dorzolamide-timolol (COSOPT) 2-0.5 % ophthalmic solution Place 1 drop into both eyes 2 (two) times daily. 10 mL 3   ezetimibe (ZETIA) 10 MG tablet Take 1 tablet (10 mg total) by mouth daily. 90 tablet 3   glucose blood (ACCU-CHEK GUIDE) test strip Use to check blood sugar 3 times daily. 100 each 6   insulin lispro (HUMALOG KWIKPEN) 100 UNIT/ML KwikPen Inject 28 Units into the skin in the morning and at bedtime. 15 mL 3   Insulin Pen Needle (BD PEN NEEDLE NANO U/F) 32G X 4 MM MISC Use as directed. 100 each 1   isosorbide mononitrate (IMDUR) 60 MG 24 hr tablet Take 1 tablet (60 mg total) by mouth daily. 90 tablet 0   ketorolac (ACULAR) 0.5 % ophthalmic solution Place 1 drop into both eyes 4 (four) times daily. 10 mL 3   Na Sulfate-K Sulfate-Mg Sulf 17.5-3.13-1.6 GM/177ML SOLN Suprep (no substitutions)-TAKE AS DIRECTED. 354 mL 0   nitroGLYCERIN (NITROSTAT) 0.4 MG SL tablet Place 1  tab under tongue for chest pain.  May repeat after 5 minutes x 2.  DO NOT TAKE MORE THAN 3 TABS DURING AN EPISODE OF CHEST PAIN 25 tablet 3   potassium chloride (KLOR-CON) 10 MEQ tablet TAKE 2 TABLETS BY MOUTH DAILY 180 tablet 3   prednisoLONE acetate (PRED FORTE) 1 % ophthalmic suspension Place 1 drop into both eyes daily. 10 mL 1   No current facility-administered medications on file prior to visit.    Allergies  Allergen Reactions   Jardiance [Empagliflozin] Other (See Comments)    Frequent yeast infection    Social History   Socioeconomic History   Marital status: Single    Spouse name: Not on file   Number of children: 2   Years of education: 44  Highest education level: GED or equivalent  Occupational History   Occupation: retired  Tobacco Use   Smoking status: Never   Smokeless tobacco: Never  Vaping Use   Vaping status: Never Used  Substance and Sexual Activity   Alcohol use: Not Currently   Drug use: Not Currently    Types: Marijuana    Comment: every 2 weeks per pt   Sexual activity: Not Currently  Other Topics Concern   Not on file  Social History Narrative   Lives at home with her grandson.   Right-handed.   No daily caffeine use.   Social Determinants of Health   Financial Resource Strain: Low Risk  (04/12/2023)   Overall Financial Resource Strain (CARDIA)    Difficulty of Paying Living Expenses: Not very hard  Food Insecurity: Food Insecurity Present (04/12/2023)   Hunger Vital Sign    Worried About Running Out of Food in the Last Year: Sometimes true    Ran Out of Food in the Last Year: Sometimes true  Transportation Needs: No Transportation Needs (04/12/2023)   PRAPARE - Administrator, Civil Service (Medical): No    Lack of Transportation (Non-Medical): No  Physical Activity: Inactive (04/12/2023)   Exercise Vital Sign    Days of Exercise per Week: 0 days    Minutes of Exercise per Session: 0 min  Stress: Stress Concern Present  (04/12/2023)   [employer] of Occupational Health - Occupational Stress Questionnaire    Feeling of Stress : To some extent  Social Connections: Socially Isolated (04/12/2023)   Social Connection and Isolation Panel [NHANES]    Frequency of Communication with Friends and Family: More than three times a week    Frequency of Social Gatherings with Friends and Family: Once a week    Attends Religious Services: Never    Database administrator or Organizations: No    Attends Banker Meetings: Never    Marital Status: Never married  Intimate Partner Violence: Not At Risk (04/12/2023)   Humiliation, Afraid, Rape, and Kick questionnaire    Fear of Current or Ex-Partner: No    Emotionally Abused: No    Physically Abused: No    Sexually Abused: No    Family History  Problem Relation Age of Onset   Heart disease Mother    Hypertension Mother    Diabetes Mother    Cancer Father        hodgkins lymphoma   Heart disease Sister        heart attack   Diabetes Sister    Colon cancer Brother 9   Diabetes Maternal Aunt    Diabetes Maternal Uncle    Cancer Other        parent   Diabetes Other        parent   Heart disease Other        parent   Hyperlipidemia Other        parent   Hypertension Other        parent   Arthritis Other        parent   Breast cancer Neg Hx    Colon polyps Neg Hx    Esophageal cancer Neg Hx    Rectal cancer Neg Hx    Stomach cancer Neg Hx     Past Surgical History:  Procedure Laterality Date   BREAST BIOPSY  2012   benign   CARDIAC CATHETERIZATION N/A 02/06/2015   Procedure: Left Heart Cath and Coronary Angiography;  Surgeon: Laurey Morale, MD;  Location: [facility] INVASIVE CV LAB;  Service: Cardiovascular;  Laterality: N/A;   CATARACT EXTRACTION Bilateral OD: 03/07/19, OS: 02/28/19   Dr. Nile Riggs   CATARACT EXTRACTION, BILATERAL     COLONOSCOPY     normal exam in [place], [place] abour 10-12 years ago   EYE SURGERY Bilateral OD: 03/07/19,  OS: 02/28/19   Cat Sx - Dr. Nile Riggs   HYSTEROSCOPY WITH D & C N/A 08/01/2018   Procedure: DILATATION AND CURETTAGE /HYSTEROSCOPY;  Surgeon: Catalina Antigua, MD;  Location:  SURGERY CENTER;  Service: Gynecology;  Laterality: N/A;   ROBOTIC ASSISTED TOTAL HYSTERECTOMY WITH BILATERAL SALPINGO OOPHERECTOMY N/A 11/28/2018   Procedure: XI ROBOTIC ASSISTED TOTAL HYSTERECTOMY WITH BILATERAL SALPINGO OOPHORECTOMY;  Surgeon: Adolphus Birchwood, MD;  Location: [facility];  Service: Gynecology;  Laterality: N/A;   SENTINEL NODE BIOPSY N/A 11/28/2018   Procedure: SENTINEL NODE BIOPSY;  Surgeon: Adolphus Birchwood, MD;  Location: [facility];  Service: Gynecology;  Laterality: N/A;   UMBILICAL HERNIA REPAIR  child    ROS: Review of Systems Negative except as stated above  PHYSICAL EXAM: BP 106/66 (BP Location: Left Arm, Patient Position: Sitting, Cuff Size: Large)   Pulse 84   Temp 98.1 F (36.7 C) (Oral)   Ht 5\' 4"  (1.626 m)   Wt (!) 305 lb (138.3 kg)   SpO2 97%   BMI 52.35 kg/m   Wt Readings from Last 3 Encounters:  04/12/23 (!) 305 lb (138.3 kg)  11/09/22 (!) 303 lb (137.4 kg)  09/23/22 (!) 303 lb (137.4 kg)    Physical Exam  General appearance - alert, well appearing, and in no distress Mental status - normal mood, behavior, speech, dress, motor activity, and thought processes Chest - clear to auscultation, no wheezes, rales or rhonchi, symmetric air entry Heart - normal rate, regular rhythm, normal S1, S2, no murmurs, rubs, clicks or gallops Musculoskeletal -right shoulder: No point tenderness.  She actually has pretty good range of motion.  Drop arm test is negative. Knees: Joints are enlarged.  No point tenderness.  Good range of motion.  She ambulates unassisted. Extremities -no lower extremity edema.      Latest Ref Rng & Units 08/06/2022   11:25 AM 01/04/2022    9:06 AM 10/07/2021    9:08 AM  CMP  Glucose 70 - 99 mg/dL 469  629    BUN 8 - 27 mg/dL 14  23    Creatinine 5.28 - 1.00 mg/dL  4.13  2.44    Sodium 010 - 144 mmol/L 144  139    Potassium 3.5 - 5.2 mmol/L 3.8  3.9  3.6   Chloride 96 - 106 mmol/L 104  100    CO2 20 - 29 mmol/L 27  24    Calcium 8.7 - 10.3 mg/dL 9.2  9.8    Total Protein 6.0 - 8.5 g/dL 7.3  7.5    Total Bilirubin 0.0 - 1.2 mg/dL 0.7  0.6    Alkaline Phos 44 - 121 IU/L 140  139    AST 0 - 40 IU/L 17  16    ALT 0 - 32 IU/L 18  21     Lipid Panel     Component Value Date/Time   CHOL 126 08/06/2022 1125   TRIG 110 08/06/2022 1125   HDL 44 08/06/2022 1125   CHOLHDL 2.9 08/06/2022 1125   CHOLHDL 5 07/15/2015 1513   VLDL 44.0 (H) 07/15/2015 1513   LDLCALC 62 08/06/2022 1125  LDLDIRECT 93.0 07/15/2015 1513    CBC    Component Value Date/Time   WBC 13.3 (H) 08/06/2022 1125   WBC 10.9 (H) 03/24/2020 1427   RBC 4.86 08/06/2022 1125   RBC 4.50 03/24/2020 1427   HGB 13.4 08/06/2022 1125   HCT 42.1 08/06/2022 1125   PLT 257 08/06/2022 1125   MCV 87 08/06/2022 1125   MCH 27.6 08/06/2022 1125   MCH 28.2 03/24/2020 1427   MCHC 31.8 08/06/2022 1125   MCHC 32.2 03/24/2020 1427   RDW 14.7 08/06/2022 1125   LYMPHSABS 1.0 12/30/2017 0812   MONOABS 1.3 (H) 12/30/2017 0812   EOSABS 0.1 12/30/2017 0812   BASOSABS 0.0 12/30/2017 0812    ASSESSMENT AND PLAN:  1. Type 2 diabetes mellitus with morbid obesity (HCC) -Advised that she continue Lantus insulin taking 58 units and NovoLog 28 units with the 2 largest meals of the day.  Should be on Ozempic 0.5 mg once a week but unclear whether she is giving herself the 0.25 whether 0.5.  I will have clinical pharmacist see her today so that she can show him how she dials the Ozempic pen to make sure she is taking the correct dose. Will have her follow-up with the clinical pharmacist in about 4 weeks.  Advised to bring her continuous glucose monitor with her so he can show her how to set it up.  In the meantime try to check blood sugars at least twice a day before meals and record the readings. Healthy eating  habits discussed and encouraged. - POCT glycosylated hemoglobin (Hb A1C) - POCT glucose (manual entry) - atorvastatin (LIPITOR) 80 MG tablet; Take 1 tablet (80 mg total) by mouth once daily. (PLEASE SCHEDULE AN APPT FOR FUTURE REFILLS)  Dispense: 90 tablet; Refill: 1 - insulin glargine (LANTUS SOLOSTAR) 100 UNIT/ML Solostar Pen; Inject 58 Units into the skin daily.Must have office visit for refills  Dispense: 15 mL; Refill: 6 - Semaglutide,0.25 or 0.5MG /DOS, (OZEMPIC, 0.25 OR 0.5 MG/DOSE,) 2 MG/3ML SOPN; Inject 0.5 mg into the skin once a week.  Dispense: 6 mL; Refill: 3  2. Insulin long-term use (HCC)   3. Long-term (current) use of injectable non-insulin antidiabetic drugs   4. Hypertension associated with diabetes (HCC) At goal. Continue carvedilol 25 mg twice a day, amlodipine 10 mg daily, isosorbide 60 mg daily,  - carvedilol (COREG) 25 MG tablet; TAKE 1 TABLET (25 MG TOTAL) BY MOUTH 2 (TWO) TIMES DAILY WITH A MEAL.  Dispense: 180 tablet; Refill: 1  5. Chronic right shoulder pain Of questionable etiology.  Pain seems to be more in the muscle than in the joint.  Will give short-term course of tramadol to take with Tylenol.  Advised that tramadol is a controlled substance and can cause drowsiness.  [place] controlled substance reporting system reviewed. - AMB referral to orthopedics - DG Shoulder Right; Future  6. Vaginal odor - Cervicovaginal ancillary only  7. Chronic pain of left knee See #5 above.  Weight loss strongly encouraged. - traMADol (ULTRAM) 50 MG tablet; Take 1 tablet (50 mg total) by mouth every 12 (twelve) hours as needed for up to 5 days.  Dispense: 15 tablet; Refill: 0  8. Encounter for screening mammogram for malignant neoplasm of breast - MM 3D SCREENING MAMMOGRAM BILATERAL BREAST; Future     Patient was given the opportunity to ask questions.  Patient verbalized understanding of the plan and was able to repeat key elements of the plan.   This  documentation  was completed using Paediatric nurse.  Any transcriptional errors are unintentional.  Orders Placed This Encounter  Procedures   DG Shoulder Right   MM 3D SCREENING MAMMOGRAM BILATERAL BREAST   AMB referral to orthopedics   POCT glycosylated hemoglobin (Hb A1C)   POCT glucose (manual entry)     Requested Prescriptions   Signed Prescriptions Disp Refills   atorvastatin (LIPITOR) 80 MG tablet 90 tablet 1    Sig: Take 1 tablet (80 mg total) by mouth once daily. (PLEASE SCHEDULE AN APPT FOR FUTURE REFILLS)   carvedilol (COREG) 25 MG tablet 180 tablet 1    Sig: TAKE 1 TABLET (25 MG TOTAL) BY MOUTH 2 (TWO) TIMES DAILY WITH A MEAL.   insulin glargine (LANTUS SOLOSTAR) 100 UNIT/ML Solostar Pen 15 mL 6    Sig: Inject 58 Units into the skin daily.Must have office visit for refills   Semaglutide,0.25 or 0.5MG /DOS, (OZEMPIC, 0.25 OR 0.5 MG/DOSE,) 2 MG/3ML SOPN 6 mL 3    Sig: Inject 0.5 mg into the skin once a week.   traMADol (ULTRAM) 50 MG tablet 15 tablet 0    Sig: Take 1 tablet (50 mg total) by mouth every 12 (twelve) hours as needed for up to 5 days.    Return in about 4 months (around 08/10/2023) for 2 weeks with clinical pharmacist for DM.  Jonah Blue, MD, FACP

## 2023-04-12 NOTE — Progress Notes (Signed)
[facility] - Clinic Note  04/20/2023     CHIEF COMPLAINT Patient presents for Retina Follow Up   HISTORY OF PRESENT ILLNESS: Kimberly Frazier is a 62 y.o. female who presents to the clinic today for:   HPI     Retina Follow Up   Patient presents with  Diabetic Retinopathy.  In both eyes.  This started 7 weeks ago.  Duration of 7 weeks.  Since onset it is stable.  I, the attending physician,  performed the HPI with the patient and updated documentation appropriately.        Comments   7 week retina follow up NPDR ou and I'VE OD and IVV OS pt is reporting no vision changes noticed she does have floaters denies any flashes pt last reading 76 this am her A!C 8.7       Last edited by Rennis Chris, MD on 04/20/2023 12:35 PM.      Referring physician: Marcine Matar, MD [address] [place],  [place] [address]  HISTORICAL INFORMATION:   Selected notes from the MEDICAL RECORD NUMBER Referred by Dr. Jethro Bolus for concern of CME    CURRENT MEDICATIONS: Current Outpatient Medications (Ophthalmic Drugs)  Medication Sig   brimonidine (ALPHAGAN) 0.15 % ophthalmic solution Place 1 drop into both eyes 2 (two) times daily.   brimonidine (ALPHAGAN) 0.2 % ophthalmic solution Place 1 drop into both eyes 2 (two) times daily.   dorzolamide-timolol (COSOPT) 2-0.5 % ophthalmic solution Place 1 drop into both eyes 2 (two) times daily.   ketorolac (ACULAR) 0.5 % ophthalmic solution Place 1 drop into both eyes 4 (four) times daily.   prednisoLONE acetate (PRED FORTE) 1 % ophthalmic suspension Place 1 drop into both eyes daily.   No current facility-administered medications for this visit. (Ophthalmic Drugs)   Current Outpatient Medications (Other)  Medication Sig   Accu-Chek Softclix Lancets lancets Use to check blood sugar 3 times daily.   amLODipine (NORVASC) 10 MG tablet Take 1 tablet (10 mg total) by mouth daily.   aspirin EC 81 MG tablet Take 1  tablet (81 mg total) daily by mouth.   atorvastatin (LIPITOR) 80 MG tablet Take 1 tablet (80 mg total) by mouth once daily. (PLEASE SCHEDULE AN APPT FOR FUTURE REFILLS)   Blood Glucose Monitoring Suppl (ACCU-CHEK GUIDE) w/Device KIT 1 kit by Does not apply route in the morning, at noon, and at bedtime. Use to check blood sugar 3 times daily.   carvedilol (COREG) 25 MG tablet TAKE 1 TABLET (25 MG TOTAL) BY MOUTH 2 (TWO) TIMES DAILY WITH A MEAL.   Continuous Blood Gluc Receiver (FREESTYLE LIBRE 2 READER) DEVI Use as directed   Continuous Blood Gluc Sensor (FREESTYLE LIBRE SENSOR SYSTEM) MISC Change sensor every 2 weeks   diclofenac Sodium (VOLTAREN) 1 % GEL Apply 2 g topically 2 (two) times daily as needed.   ezetimibe (ZETIA) 10 MG tablet Take 1 tablet (10 mg total) by mouth daily.   glucose blood (ACCU-CHEK GUIDE) test strip Use to check blood sugar 3 times daily.   insulin glargine (LANTUS SOLOSTAR) 100 UNIT/ML Solostar Pen Inject 58 Units into the skin daily.Must have office visit for refills   insulin lispro (HUMALOG KWIKPEN) 100 UNIT/ML KwikPen Inject 28 Units into the skin in the morning and at bedtime.   Insulin Pen Needle (BD PEN NEEDLE NANO U/F) 32G X 4 MM MISC Use as directed.   isosorbide mononitrate (IMDUR) 60 MG 24 hr  tablet Take 1 tablet (60 mg total) by mouth daily.   Na Sulfate-K Sulfate-Mg Sulf 17.5-3.13-1.6 GM/177ML SOLN Suprep (no substitutions)-TAKE AS DIRECTED.   nitroGLYCERIN (NITROSTAT) 0.4 MG SL tablet Place 1 tab under tongue for chest pain.  May repeat after 5 minutes x 2.  DO NOT TAKE MORE THAN 3 TABS DURING AN EPISODE OF CHEST PAIN   potassium chloride (KLOR-CON) 10 MEQ tablet TAKE 2 TABLETS BY MOUTH DAILY   Semaglutide,0.25 or 0.5MG /DOS, (OZEMPIC, 0.25 OR 0.5 MG/DOSE,) 2 MG/3ML SOPN Inject 0.5 mg into the skin once a week.   traMADol (ULTRAM) 50 MG tablet Take 1 tablet (50 mg total) by mouth every 12 (twelve) hours as needed for up to 5 days.   No current  facility-administered medications for this visit. (Other)   REVIEW OF SYSTEMS: ROS   Positive for: Endocrine, Cardiovascular, Eyes Negative for: Constitutional, Gastrointestinal, Neurological, Skin, Genitourinary, Musculoskeletal, HENT, Respiratory, Psychiatric, Allergic/Imm, Heme/Lymph Last edited by Etheleen Mayhew, COT on 04/20/2023  9:54 AM.       ALLERGIES Allergies  Allergen Reactions   Jardiance [Empagliflozin] Other (See Comments)    Frequent yeast infection   PAST MEDICAL HISTORY Past Medical History:  Diagnosis Date   Adrenal adenoma, left    Arthritis    Cancer (HCC)    CKD (chronic kidney disease), stage II    Coronary artery disease 07-28-2018 followed [facility]  currently due to no insurance   per cardiac cath 02-06-2015 (positive mild lateral ishcemia on stress test)--- dLAD 80%,  mLAD 40%,  mPDA 80% (small vessel),  ostial D1 70%,  CFx with lumial irregarlities-- medical management   Depression    Diabetic neuropathy (HCC)    Diabetic retinopathy (HCC)    NPDR OU   Headache    History of Bell's palsy 07/2011   per pt residual facial pain on left side occasionally   History of cancer of vagina 1999   per pt completed radiation and chemo   History of sepsis 12/30/2017   positive blood culter fro E.coli   Hyperlipidemia    Hypertension    Hypertensive retinopathy    OU   Hypokalemia    Insulin dependent type 2 diabetes mellitus, uncontrolled    followed by pcp---  A1c was 11.7 on 06-15-2018 in epic   Myocardial infarction [facility])    10 yrs ago?   Nocturia    Peripheral neuropathy    PMB (postmenopausal bleeding)    Wears dentures    upper   Wears glasses    Past Surgical History:  Procedure Laterality Date   BREAST BIOPSY  2012   benign   CARDIAC CATHETERIZATION N/A 02/06/2015   Procedure: Left Heart Cath and Coronary Angiography;  Surgeon: Laurey Morale, MD;  Location: [facility] INVASIVE CV LAB;  Service: Cardiovascular;   Laterality: N/A;   CATARACT EXTRACTION Bilateral OD: 03/07/19, OS: 02/28/19   Dr. Nile Riggs   CATARACT EXTRACTION, BILATERAL     COLONOSCOPY     normal exam in [place], [place] abour 10-12 years ago   EYE SURGERY Bilateral OD: 03/07/19, OS: 02/28/19   Cat Sx - Dr. Nile Riggs   HYSTEROSCOPY WITH D & C N/A 08/01/2018   Procedure: DILATATION AND CURETTAGE /HYSTEROSCOPY;  Surgeon: Catalina Antigua, MD;  Location: [facility];  Service: Gynecology;  Laterality: N/A;   ROBOTIC ASSISTED TOTAL HYSTERECTOMY WITH BILATERAL SALPINGO OOPHERECTOMY N/A 11/28/2018   Procedure: XI ROBOTIC ASSISTED TOTAL HYSTERECTOMY WITH BILATERAL SALPINGO OOPHORECTOMY;  Surgeon: Andrey Farmer,  Kara Mead, MD;  Location: [facility];  Service: Gynecology;  Laterality: N/A;   SENTINEL NODE BIOPSY N/A 11/28/2018   Procedure: SENTINEL NODE BIOPSY;  Surgeon: Adolphus Birchwood, MD;  Location: [facility];  Service: Gynecology;  Laterality: N/A;   UMBILICAL HERNIA REPAIR  child   FAMILY HISTORY Family History  Problem Relation Age of Onset   Heart disease Mother    Hypertension Mother    Diabetes Mother    Cancer Father        hodgkins lymphoma   Heart disease Sister        heart attack   Diabetes Sister    Colon cancer Brother 72   Diabetes Maternal Aunt    Diabetes Maternal Uncle    Cancer Other        parent   Diabetes Other        parent   Heart disease Other        parent   Hyperlipidemia Other        parent   Hypertension Other        parent   Arthritis Other        parent   Breast cancer Neg Hx    Colon polyps Neg Hx    Esophageal cancer Neg Hx    Rectal cancer Neg Hx    Stomach cancer Neg Hx    SOCIAL HISTORY Social History   Tobacco Use   Smoking status: Never   Smokeless tobacco: Never  Vaping Use   Vaping status: Never Used  Substance Use Topics   Alcohol use: Not Currently   Drug use: Not Currently    Types: Marijuana    Comment: every 2 weeks per pt       OPHTHALMIC EXAM:  Base Eye Exam     Visual  Acuity (Snellen - Linear)       Right Left   Dist Bromide 20/20 -2 20/150 -2   Dist ph [place]  NI         Tonometry (Tonopen, 9:58 AM)       Right Left   Pressure 16 16         Pupils       Pupils Dark Light Shape React APD   Right PERRL 3 2 Round Brisk None   Left PERRL 3 2 Round Brisk None         Visual Fields       Left Right    Full Full         Extraocular Movement       Right Left    Full, Ortho Full, Ortho         Neuro/Psych     Oriented x3: Yes   Mood/Affect: Normal         Dilation     Both eyes: 2.5% Phenylephrine @ 9:59 AM           Slit Lamp and Fundus Exam     Slit Lamp Exam       Right Left   Lids/Lashes Dermatochalasis - upper lid Dermatochalasis - upper lid   Conjunctiva/Sclera Mild Melanosis Mild Melanosis   Cornea 1+ Punctate epithelial erosions 2+ central punctate epithelial erosions, decreased TBUT, mild tear film debris, well healed temporal cataract wounds   Anterior Chamber Deep, 0.5+cell/pigment Deep, 0.5+cell/pigment   Iris Round and dilated, No NVI Round and dilated, No NVI   Lens PC IOL in good position with trace PCO PC IOL in good position with 1+PCO  Anterior Vitreous Vitreous syneresis, Posterior vitreous detachment Vitreous syneresis, Posterior vitreous detachment, vitreous condensations         Fundus Exam       Right Left   Disc Pink and Sharp Compact, Pink and Sharp, temporal PPA   C/D Ratio 0.1 0.1   Macula Good foveal reflex, persistent cystic changes / edema inferior macula -- slightly improved, minimal Microaneurysms Blunted foveal reflex, central edema - slightly improved, +exudates/MA/IRH -- greatest temporal macula -- stably improved, +central atrophy, no heme   Vessels attenuated, Tortuous attenuated, Tortuous   Periphery Attached, scattered drusen, rare scattered DBH, reticular degeneration Attached, scattered drusen, scattered DBH--greatest nasal to disc, reticular degeneration            IMAGING AND PROCEDURES  Imaging and Procedures for 04/20/2023  OCT, Retina - OU - Both Eyes       Right Eye Quality was good. Central Foveal Thickness: 250. Progression has improved. Findings include no SRF, abnormal foveal contour, intraretinal hyper-reflective material, intraretinal fluid, vitreomacular adhesion (Persistent IRF/cystic changes inferior macula and fovea -- slightly improved).   Left Eye Quality was good. Central Foveal Thickness: 165. Progression has improved. Findings include no SRF, abnormal foveal contour, retinal drusen , subretinal hyper-reflective material, intraretinal hyper-reflective material, intraretinal fluid, outer retinal atrophy, vitreomacular adhesion (Mild interval improvement in IRF/IRHM inferior and temporal mac and fovea, +central ORA).   Notes *Images captured and stored on drive  Diagnosis / Impression:  Macular edema OU - likely combination of CME + DME OU OD: Persistent IRF/cystic changes inferior macula and fovea -- slightly improved OS: interval improvement in IRF/IRHM inferior and temporal mac and fovea, +central ORA  Clinical management:  See below  Abbreviations: NFP - Normal foveal profile. CME - cystoid macular edema. PED - pigment epithelial detachment. IRF - intraretinal fluid. SRF - subretinal fluid. EZ - ellipsoid zone. ERM - epiretinal membrane. ORA - outer retinal atrophy. ORT - outer retinal tubulation. SRHM - subretinal hyper-reflective material      Intravitreal Injection, Pharmacologic Agent - OD - Right Eye       Time Out 04/20/2023. 10:31 AM. Confirmed correct patient, procedure, site, and patient consented.   Anesthesia Topical anesthesia was used. Anesthetic medications included Lidocaine 2%, Proparacaine 0.5%.   Procedure Preparation included 5% betadine to ocular surface, eyelid speculum. A (32g) needle was used.   Injection: 2 mg aflibercept 2 MG/0.05ML   Route: Intravitreal, Site: Right Eye   NDC:  L6038910, Lot: [id number], Expiration date: 08/28/2024, Waste: 0 mL   Post-op Post injection exam found visual acuity of at least counting fingers. The patient tolerated the procedure well. There were no complications. The patient received written and verbal post procedure care education.      Intravitreal Injection, Pharmacologic Agent - OS - Left Eye       Time Out 04/20/2023. 10:32 AM. Confirmed correct patient, procedure, site, and patient consented.   Anesthesia Topical anesthesia was used. Anesthetic medications included Lidocaine 2%, Proparacaine 0.5%.   Procedure Preparation included 5% betadine to ocular surface, eyelid speculum. A (32g) needle was used.   Injection: 6 mg faricimab-svoa 6 MG/0.05ML   Route: Intravitreal, Site: Left Eye   NDC: 09811-914-78, Lot: [id number], Expiration date: 03/30/2024, Waste: 0 mL   Post-op Post injection exam found visual acuity of at least counting fingers. The patient tolerated the procedure well. There were no complications. The patient received written and verbal post procedure care education. Post injection medications were not given.  ASSESSMENT/PLAN:    ICD-10-CM   1. Moderate nonproliferative diabetic retinopathy of both eyes with macular edema associated with type 2 diabetes mellitus (HCC)  E11.3313 OCT, Retina - OU - Both Eyes    Intravitreal Injection, Pharmacologic Agent - OD - Right Eye    Intravitreal Injection, Pharmacologic Agent - OS - Left Eye    faricimab-svoa (VABYSMO) 6mg /0.58mL intravitreal injection    aflibercept (EYLEA) SOLN 2 mg    2. Current use of insulin (HCC)  Z79.4     3. Long-term (current) use of injectable non-insulin antidiabetic drugs  Z79.85     4. CME (cystoid macular edema), bilateral  H35.353     5. Essential hypertension  I10     6. Hypertensive retinopathy of both eyes  H35.033     7. Pseudophakia of both eyes  Z96.1     8. Bilateral ocular hypertension  H40.053      9. Dry eyes, bilateral  H04.123      1-4. Moderate Non-proliferative diabetic retinopathy, OU OD with combination DME/CME  - last A1c 8.7 (11.12.24), 8.2 (03.08.24); 9.6 (08.07.23), 10.1 (1.31.23)  - pt now on Ozempic as of Mar 2024 - h/o delayed to follow up from September 2021 to April 2022--7 mos instead of 4 wks**             - exam shows scattered IRH, edema and tortuous blood vessels OU - FA (10.23.20) shows leaking MA OU and paracentral petaloid hyperfluorecence OD --> CME/[place] - s/p IVA OD #1 (11.06.20), #2 (12.18.20), #3 (02.24.21), #4 (03.24.21), #5 (04.21.21), #6 (5.26.21), #7 (07.26.21), #8 (08.25.21), #9 (09.23.21), #10 (04.26.22) -- IVA resistance - s/p IVA OS #1 (10.23.20), #2 (11.18.20), #3 (12.18.20), #4 (02.24.21), #5 (03.24.21) -- IVA resistance  ======================================================== - s/p IVE OD #1 (05.25.22), #2 (07.01.22), #3 (07.29.22), #4 (08.29.22 - JDM), #5 (09.26.22), #6 (10.24.22), #7 (11.21.22), #8 (12.19.22), #9 (01.23.23), #10 (02.20.23), #11 (03.22.23), #12 (04.19.23), #13 (05.17.23), #14 (06.14.23), #15 (08.04.23), #16 (09.01.23), #17 (10.10.23), #18 (11.14.23), #19 (12.19.23), #20 (02.13.24), #21 (03.20.24), #22 (03.20.24), #23 (03.20.24), #24 (04.24.24) #25(06.28.24), #26 (08.16.24), #27 (10.02.24) - s/p IVE OS #1 (04.21.21), #2 (5.26.21), #3 (07.26.21), #4 (08.25.21), #5 (09.23.21), #6 (04.26.22), #7 (05.25.22), #8 (07.01.22), #9 (07.29.22), #10 (08.29.22 - JDM), #11 (09.26.22), #12 (10.24.22) -- IVE resistance ======================================================== - s/p IVV Vabysmo OS #1 (11.21.22, sample), #2 (12.19.22), #3 (01.23.23), #4 (02.20.23), #5 (03.22.23), #6 (04.19.23), #7 (05.17.23), #8 (06.14.23, sample), #9 (08.04.23), #10 (9.01.23), #11 (10.10.23), #12 (11.14.23), #13 (12.19.23), #14 (02.13.24), #15 (03.20.24) #16(3.20.24), #17 (03.20.24), #18 (04.24.24) #19(06.28.24), #20 (08.16.24), #21 (10.02.24) - BCVA OD 20/20; OS  20/150 - OCT today: OD: Persistent IRF/cystic changes inferior macula and fovea -- slightly improved  OS: interval improvement in IRF/IRHM inferior and temporal mac and fovea, +central ORA at 7 weeks - recommend IVE OD #28 and IVV OS #22 today, 11.20.24 with follow up in 7 weeks again  - pt wishes to proceed w/ injections             - RBA of procedure discussed, questions answered  - Eylea OU informed consent form re-signed and scanned on 8.16.24 (OD) - Vabysmo OS consent form signed and scanned on 11.20.24 - see procedure note             - continue Ketorolac QID OU             - f/u in 7 weeks, DFE, OCT, possible injection OU   5,6. Hypertensive retinopathy OU             -  discussed importance of tight BP control             - continue to monitor   7. Pseudophakia OU            - s/p CE/IOL OU Nile Riggs)                        OD: 03/07/2019                        OS: 02/28/2019             - IOLs in good position             - CME OU as above             - continue to monitor   8. Ocular Hypertension OU             - IOP: 16 OU - h/o steroid response             - cont Cosopt BID OU and Brimonidine BID OU  9. Dry eyes OU - recommend artificial tears and lubricating ointment as needed   Ophthalmic Meds Ordered this visit:  Meds ordered this encounter  Medications   faricimab-svoa (VABYSMO) 6mg /0.25mL intravitreal injection   aflibercept (EYLEA) SOLN 2 mg     Return in about 7 weeks (around 06/08/2023) for f/u NPDR OU, DFE, OCT, Possible, IVE, OD, IVV, OS.  There are no Patient Instructions on file for this visit.  This document serves as a record of services personally performed by Karie Chimera, MD, PhD. It was created on their behalf by De Blanch, an ophthalmic technician. The creation of this record is the provider's dictation and/or activities during the visit.    Electronically signed by: De Blanch, OA, 04/20/23  12:36 PM  This document serves as a  record of services personally performed by Karie Chimera, MD, PhD. It was created on their behalf by Charlette Caffey, COT an ophthalmic technician. The creation of this record is the provider's dictation and/or activities during the visit.    Electronically signed by:  Charlette Caffey, COT  04/20/23 12:36 PM  Karie Chimera, M.D., Ph.D. Diseases & Surgery of the Retina and Vitreous [facility]  I have reviewed the above documentation for accuracy and completeness, and I agree with the above. Karie Chimera, M.D., Ph.D. 04/20/23 12:37 PM   Abbreviations: M myopia (nearsighted); A astigmatism; H hyperopia (farsighted); P presbyopia; Mrx spectacle prescription;  CTL contact lenses; OD right eye; OS left eye; OU both eyes  XT exotropia; ET esotropia; PEK punctate epithelial keratitis; PEE punctate epithelial erosions; DES dry eye syndrome; MGD meibomian gland dysfunction; ATs artificial tears; PFAT's preservative free artificial tears; NSC nuclear sclerotic cataract; PSC posterior subcapsular cataract; ERM epi-retinal membrane; PVD posterior vitreous detachment; RD retinal detachment; DM diabetes mellitus; DR diabetic retinopathy; NPDR non-proliferative diabetic retinopathy; PDR proliferative diabetic retinopathy; CSME clinically significant macular edema; DME diabetic macular edema; dbh dot blot hemorrhages; CWS cotton wool spot; POAG primary open angle glaucoma; C/D cup-to-disc ratio; HVF humphrey visual field; GVF goldmann visual field; OCT optical coherence tomography; IOP intraocular pressure; BRVO Branch retinal vein occlusion; CRVO central retinal vein occlusion; CRAO central retinal artery occlusion; BRAO branch retinal artery occlusion; RT retinal tear; SB scleral buckle; PPV pars plana vitrectomy; VH Vitreous hemorrhage; PRP panretinal laser photocoagulation; IVK intravitreal kenalog; VMT vitreomacular traction;  MH Macular hole;  NVD neovascularization of the disc;  NVE neovascularization elsewhere; AREDS age related eye disease study; ARMD age related macular degeneration; POAG primary open angle glaucoma; EBMD epithelial/anterior basement membrane dystrophy; ACIOL anterior chamber intraocular lens; IOL intraocular lens; PCIOL posterior chamber intraocular lens; Phaco/IOL phacoemulsification with intraocular lens placement; PRK photorefractive keratectomy; LASIK laser assisted in situ keratomileusis; HTN hypertension; DM diabetes mellitus; COPD chronic obstructive pulmonary disease

## 2023-04-13 ENCOUNTER — Other Ambulatory Visit: Payer: Self-pay

## 2023-04-13 LAB — CERVICOVAGINAL ANCILLARY ONLY
Bacterial Vaginitis (gardnerella): NEGATIVE
Candida Glabrata: NEGATIVE
Candida Vaginitis: NEGATIVE
Chlamydia: NEGATIVE
Comment: NEGATIVE
Comment: NEGATIVE
Comment: NEGATIVE
Comment: NEGATIVE
Comment: NEGATIVE
Comment: NORMAL
Neisseria Gonorrhea: NEGATIVE
Trichomonas: NEGATIVE

## 2023-04-14 ENCOUNTER — Other Ambulatory Visit: Payer: Self-pay | Admitting: Internal Medicine

## 2023-04-14 ENCOUNTER — Other Ambulatory Visit: Payer: Self-pay

## 2023-04-14 DIAGNOSIS — G8929 Other chronic pain: Secondary | ICD-10-CM

## 2023-04-20 ENCOUNTER — Encounter (INDEPENDENT_AMBULATORY_CARE_PROVIDER_SITE_OTHER): Payer: Self-pay | Admitting: Ophthalmology

## 2023-04-20 ENCOUNTER — Ambulatory Visit (INDEPENDENT_AMBULATORY_CARE_PROVIDER_SITE_OTHER): Payer: 59 | Admitting: Ophthalmology

## 2023-04-20 DIAGNOSIS — Z7985 Long-term (current) use of injectable non-insulin antidiabetic drugs: Secondary | ICD-10-CM | POA: Diagnosis not present

## 2023-04-20 DIAGNOSIS — H35353 Cystoid macular degeneration, bilateral: Secondary | ICD-10-CM | POA: Diagnosis not present

## 2023-04-20 DIAGNOSIS — E113313 Type 2 diabetes mellitus with moderate nonproliferative diabetic retinopathy with macular edema, bilateral: Secondary | ICD-10-CM | POA: Diagnosis not present

## 2023-04-20 DIAGNOSIS — Z961 Presence of intraocular lens: Secondary | ICD-10-CM

## 2023-04-20 DIAGNOSIS — I10 Essential (primary) hypertension: Secondary | ICD-10-CM | POA: Diagnosis not present

## 2023-04-20 DIAGNOSIS — H04123 Dry eye syndrome of bilateral lacrimal glands: Secondary | ICD-10-CM | POA: Diagnosis not present

## 2023-04-20 DIAGNOSIS — H35033 Hypertensive retinopathy, bilateral: Secondary | ICD-10-CM | POA: Diagnosis not present

## 2023-04-20 DIAGNOSIS — Z794 Long term (current) use of insulin: Secondary | ICD-10-CM | POA: Diagnosis not present

## 2023-04-20 DIAGNOSIS — H40053 Ocular hypertension, bilateral: Secondary | ICD-10-CM

## 2023-04-20 MED ORDER — AFLIBERCEPT 2MG/0.05ML IZ SOLN FOR KALEIDOSCOPE
2.0000 mg | INTRAVITREAL | Status: AC | PRN
  Administered 2023-04-20: 2 mg via INTRAVITREAL

## 2023-04-20 MED ORDER — FARICIMAB-SVOA 6 MG/0.05ML IZ SOSY
6.0000 mg | PREFILLED_SYRINGE | INTRAVITREAL | Status: AC | PRN
  Administered 2023-04-20: 6 mg via INTRAVITREAL

## 2023-04-22 ENCOUNTER — Encounter: Payer: Self-pay | Admitting: Physician Assistant

## 2023-04-22 ENCOUNTER — Other Ambulatory Visit (INDEPENDENT_AMBULATORY_CARE_PROVIDER_SITE_OTHER): Payer: 59

## 2023-04-22 ENCOUNTER — Ambulatory Visit (INDEPENDENT_AMBULATORY_CARE_PROVIDER_SITE_OTHER): Payer: 59 | Admitting: Physician Assistant

## 2023-04-22 DIAGNOSIS — M25511 Pain in right shoulder: Secondary | ICD-10-CM

## 2023-04-22 DIAGNOSIS — M545 Low back pain, unspecified: Secondary | ICD-10-CM

## 2023-04-22 DIAGNOSIS — M542 Cervicalgia: Secondary | ICD-10-CM

## 2023-04-22 NOTE — Progress Notes (Signed)
Office Visit Note   Patient: Kimberly Frazier           Date of Birth: 28-Sep-1960           MRN: [id number] Visit Date: 04/22/2023              Requested by: Marcine Matar, MD [address] [place],  [place] [address] PCP: Marcine Matar, MD   Assessment & Plan: Visit Diagnoses:  1. Neck pain   2. Acute midline low back pain, unspecified whether sciatica present   3. Acute pain of right shoulder     Plan: Kimberly Frazier is a pleasant 62 year old woman who comes in for 2 reasons today.  1 is right shoulder pain.  The other is low back pain.  Both of an issue for a while but seem to be getting worse.  She denies any paresthesias but sometimes feels her left leg is a little weak.  She has not really done any treatment.  She is an uncontrolled diabetic with a hemoglobin A1c of over 8.  And she cannot take anti-inflammatories because of kidney function.  She denies any change in bowel or bladder function.  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  Her strength is actually good but she has a positive empty can test and cannot internally rotate behind her back because of pain.  We talked about Voltaren gel for this as well as physical therapy. Regards to her low back she is neurologically intact today no red flags.  She does have most of her symptoms when she does get weakness is on her left side she has no saddle anesthesia.  Again I discussed with her an MRI would be possible but I would like her to try therapy first.  She will follow-up with me in 1 month sooner if she develops any increasing symptoms  Orders:  Orders Placed This Encounter  Procedures   XR Cervical Spine 2 or 3 views   XR Lumbar Spine 2-3 Views   XR Shoulder Right   No orders of the defined types were placed in this encounter.     Procedures: No procedures performed   Clinical Data: No additional findings.   Subjective: Chief Complaint  Patient presents  with   Left Shoulder - Pain   Lower Back - Pain    HPI Plan: Patient is a 62 year old woman with a 30-month history of right shoulder pain radiating down her arm.  No achiness in the joint itself but shooting pain.  Also complaining of low back pain with increasing pain for 3 months straight across the back.  She sometimes feels her left leg is weak.  Denies any particular injury  Review of Systems  All other systems reviewed and are negative.    Objective: Vital Signs: There were no vitals taken for this visit.  Physical Exam Constitutional:      Appearance: Normal appearance.  Pulmonary:     Effort: Pulmonary effort is normal.  Skin:    General: Skin is warm and dry.  Neurological:     General: No focal deficit present.     Mental Status: She is alert and oriented to person, place, and time.  Psychiatric:        Mood and Affect: Mood normal.        Behavior: Behavior normal.     Ortho Exam Right shoulder: She has forward elevation to 170 degrees but does have  pain with this.  She does not feel comfortable internally rotating her arm behind her back.  No pain with external rotation.  Abductor external strength is intact she is neurovascularly intact neck range of motion does not seem to reproduce her symptoms.  She has good grip strength. Specialty Comments:  No specialty comments available.  Imaging: No results found.   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   History of  radiation therapy 11/28/2018   Diabetes mellitus (HCC) 10/06/2018   Type 2 diabetes mellitus with hyperglycemia, with long-term current use of insulin (HCC) 10/06/2018   Adrenal adenoma, left 06/15/2018   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   Postmenopausal vaginal bleeding 06/15/2018   Hyperkalemia    Diabetic peripheral neuropathy (HCC) 09/03/2016   CAD (coronary artery disease) 04/10/2015   Neck pain 06/20/2014   Severe obesity (BMI >= 40) (HCC) 09/20/2013   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   Hyperlipidemia 10/25/2012   Diabetes mellitus type 2, uncontrolled, with complications 07/26/2012   Essential hypertension 07/26/2012   Past Medical History:  Diagnosis Date   Adrenal adenoma, left    Arthritis    Cancer (HCC)    CKD (chronic kidney disease), stage II    Coronary artery disease 07-28-2018 followed [facility])  currently due to no insurance   per cardiac cath 02-06-2015 (positive mild lateral ishcemia on stress test)--- dLAD 80%,  mLAD 40%,  mPDA 80% (small vessel),  ostial D1 70%,  CFx with lumial irregarlities-- medical management   Depression    Diabetic neuropathy (HCC)    Diabetic retinopathy (HCC)    NPDR OU   Headache    History of Bell's palsy 07/2011   per pt residual facial pain on left side occasionally   History of cancer of vagina 1999   per pt completed radiation and chemo   History of sepsis 12/30/2017   positive blood culter fro E.coli   Hyperlipidemia    Hypertension    Hypertensive retinopathy    OU   Hypokalemia    Insulin dependent type 2 diabetes mellitus, uncontrolled    followed by pcp---  A1c was 11.7 on 06-15-2018 in epic   Myocardial infarction [facility])    10 yrs ago?   Nocturia    Peripheral neuropathy    PMB (postmenopausal bleeding)    Wears dentures    upper   Wears glasses     Family History  Problem Relation Age of Onset   Heart disease Mother    Hypertension Mother    Diabetes Mother    Cancer Father         hodgkins lymphoma   Heart disease Sister        heart attack   Diabetes Sister    Colon cancer Brother 71   Diabetes Maternal Aunt    Diabetes Maternal Uncle    Cancer Other        parent   Diabetes Other        parent   Heart disease Other  parent   Hyperlipidemia Other        parent   Hypertension Other        parent   Arthritis Other        parent   Breast cancer Neg Hx    Colon polyps Neg Hx    Esophageal cancer Neg Hx    Rectal cancer Neg Hx    Stomach cancer Neg Hx     Past Surgical History:  Procedure Laterality Date   BREAST BIOPSY  2012   benign   CARDIAC CATHETERIZATION N/A 02/06/2015   Procedure: Left Heart Cath and Coronary Angiography;  Surgeon: Laurey Morale, MD;  Location: [facility] INVASIVE CV LAB;  Service: Cardiovascular;  Laterality: N/A;   CATARACT EXTRACTION Bilateral OD: 03/07/19, OS: 02/28/19   Dr. Nile Riggs   CATARACT EXTRACTION, BILATERAL     COLONOSCOPY     normal exam in [place], [place] abour 10-12 years ago   EYE SURGERY Bilateral OD: 03/07/19, OS: 02/28/19   Cat Sx - Dr. Nile Riggs   HYSTEROSCOPY WITH D & C N/A 08/01/2018   Procedure: DILATATION AND CURETTAGE /HYSTEROSCOPY;  Surgeon: Catalina Antigua, MD;  Location: [facility];  Service: Gynecology;  Laterality: N/A;   ROBOTIC ASSISTED TOTAL HYSTERECTOMY WITH BILATERAL SALPINGO OOPHERECTOMY N/A 11/28/2018   Procedure: XI ROBOTIC ASSISTED TOTAL HYSTERECTOMY WITH BILATERAL SALPINGO OOPHORECTOMY;  Surgeon: Adolphus Birchwood, MD;  Location: [facility];  Service: Gynecology;  Laterality: N/A;   SENTINEL NODE BIOPSY N/A 11/28/2018   Procedure: SENTINEL NODE BIOPSY;  Surgeon: Adolphus Birchwood, MD;  Location: [facility];  Service: Gynecology;  Laterality: N/A;   UMBILICAL HERNIA REPAIR  child   Social History   Occupational History   Occupation: retired  Tobacco Use   Smoking status: Never   Smokeless tobacco: Never  Vaping Use   Vaping status: Never Used  Substance and Sexual Activity   Alcohol use:  Not Currently   Drug use: Not Currently    Types: Marijuana    Comment: every 2 weeks per pt   Sexual activity: Not Currently

## 2023-05-03 ENCOUNTER — Other Ambulatory Visit (INDEPENDENT_AMBULATORY_CARE_PROVIDER_SITE_OTHER): Payer: Self-pay | Admitting: Ophthalmology

## 2023-05-03 ENCOUNTER — Other Ambulatory Visit: Payer: Self-pay

## 2023-05-03 ENCOUNTER — Ambulatory Visit: Payer: 59 | Admitting: Pharmacist

## 2023-05-04 ENCOUNTER — Other Ambulatory Visit: Payer: Self-pay

## 2023-05-12 ENCOUNTER — Other Ambulatory Visit: Payer: Self-pay

## 2023-05-12 MED ORDER — KETOROLAC TROMETHAMINE 0.5 % OP SOLN
1.0000 [drp] | Freq: Four times a day (QID) | OPHTHALMIC | 3 refills | Status: AC
  Filled 2023-05-12: qty 10, 25d supply, fill #0
  Filled 2023-06-13: qty 10, 50d supply, fill #0
  Filled 2023-06-17 – 2023-09-07 (×3): qty 10, 50d supply, fill #1
  Filled 2023-10-25: qty 10, 50d supply, fill #2
  Filled 2023-12-14: qty 10, 50d supply, fill #3
  Filled 2023-12-14: qty 10, 50d supply, fill #0
  Filled 2023-12-21: qty 10, 25d supply, fill #0

## 2023-05-12 MED ORDER — BRIMONIDINE TARTRATE 0.2 % OP SOLN
1.0000 [drp] | Freq: Two times a day (BID) | OPHTHALMIC | 6 refills | Status: DC
  Filled 2023-05-12: qty 10, 50d supply, fill #0
  Filled 2023-06-13: qty 10, 100d supply, fill #0
  Filled 2023-09-03 – 2023-09-05 (×2): qty 10, 50d supply, fill #0
  Filled 2023-12-14 (×2): qty 10, 50d supply, fill #1
  Filled 2023-12-21: qty 10, 50d supply, fill #0
  Filled 2024-02-02 – 2024-02-24 (×3): qty 10, 50d supply, fill #1
  Filled 2024-04-11 – 2024-04-24 (×2): qty 10, 50d supply, fill #2

## 2023-05-17 ENCOUNTER — Ambulatory Visit
Admission: RE | Admit: 2023-05-17 | Discharge: 2023-05-17 | Disposition: A | Discharge status: Home / Self Care | Payer: 59 | Source: Ambulatory Visit | Attending: Internal Medicine | Admitting: Internal Medicine

## 2023-05-17 DIAGNOSIS — Z1231 Encounter for screening mammogram for malignant neoplasm of breast: Secondary | ICD-10-CM

## 2023-05-23 ENCOUNTER — Other Ambulatory Visit: Payer: Self-pay

## 2023-05-30 ENCOUNTER — Other Ambulatory Visit: Payer: Self-pay

## 2023-05-31 ENCOUNTER — Other Ambulatory Visit: Payer: Self-pay

## 2023-06-06 NOTE — Progress Notes (Addendum)
 [facility] - Clinic Note  06/08/2023     CHIEF COMPLAINT Patient presents for Retina Follow Up   HISTORY OF PRESENT ILLNESS: Kimberly Frazier is a 63 y.o. female who presents to the clinic today for:   HPI     Retina Follow Up   Patient presents with  Diabetic Retinopathy.  In both eyes.  This started 7 weeks ago.  Duration of 7 weeks.  Since onset it is stable.  I, the attending physician,  performed the HPI with the patient and updated documentation appropriately.        Comments   7 week retina follow up NPDR OU IVE OD IVV OS pt is reporting no vision changes noticed she's denies any flashes does have some floaters       Last edited by Valdemar Rogue, MD on 06/08/2023 12:11 PM.     Referring physician: [name], MD [address] [place],  [place] [address]  HISTORICAL INFORMATION:   Selected notes from the MEDICAL RECORD NUMBER Referred by Dr. Oneil Platts for concern of CME    CURRENT MEDICATIONS: Current Outpatient Medications (Ophthalmic Drugs)  Medication Sig   brimonidine  (ALPHAGAN ) 0.15 % ophthalmic solution Place 1 drop into both eyes 2 (two) times daily.   brimonidine  (ALPHAGAN ) 0.2 % ophthalmic solution Place 1 drop into both eyes 2 (two) times daily.   dorzolamide -timolol  (COSOPT ) 2-0.5 % ophthalmic solution Place 1 drop into both eyes 2 (two) times daily.   ketorolac  (ACULAR ) 0.5 % ophthalmic solution Place 1 drop into both eyes 4 (four) times daily.   prednisoLONE  acetate (PRED FORTE ) 1 % ophthalmic suspension Place 1 drop into both eyes daily.   No current facility-administered medications for this visit. (Ophthalmic Drugs)   Current Outpatient Medications (Other)  Medication Sig   Accu-Chek Softclix Lancets lancets Use to check blood sugar 3 times daily.   amLODipine  (NORVASC ) 10 MG tablet Take 1 tablet (10 mg total) by mouth daily.   aspirin  EC 81 MG tablet Take 1 tablet (81 mg total) daily by mouth.    atorvastatin  (LIPITOR) 80 MG tablet Take 1 tablet (80 mg total) by mouth once daily. (PLEASE SCHEDULE AN APPT FOR FUTURE REFILLS)   Blood Glucose Monitoring Suppl (ACCU-CHEK GUIDE) w/Device KIT 1 kit by Does not apply route in the morning, at noon, and at bedtime. Use to check blood sugar 3 times daily.   carvedilol  (COREG ) 25 MG tablet TAKE 1 TABLET (25 MG TOTAL) BY MOUTH 2 (TWO) TIMES DAILY WITH A MEAL.   Continuous Blood Gluc Receiver (FREESTYLE LIBRE 2 READER) DEVI Use as directed   Continuous Blood Gluc Sensor (FREESTYLE LIBRE SENSOR SYSTEM) MISC Change sensor every 2 weeks   diclofenac  Sodium (VOLTAREN ) 1 % GEL Apply 2 g topically 2 (two) times daily as needed.   ezetimibe  (ZETIA ) 10 MG tablet Take 1 tablet (10 mg total) by mouth daily.   glucose blood (ACCU-CHEK GUIDE) test strip Use to check blood sugar 3 times daily.   insulin  glargine (LANTUS  SOLOSTAR) 100 UNIT/ML Solostar Pen Inject 58 Units into the skin daily.Must have office visit for refills   insulin  lispro (HUMALOG  KWIKPEN) 100 UNIT/ML KwikPen Inject 28 Units into the skin in the morning and at bedtime.   Insulin  Pen Needle (BD PEN NEEDLE NANO U/F) 32G X 4 MM MISC Use as directed.   isosorbide  mononitrate (IMDUR ) 60 MG 24 hr tablet Take 1 tablet (60 mg total) by mouth daily.  Na Sulfate-K Sulfate-Mg Sulf 17.5-3.13-1.6 GM/177ML SOLN Suprep (no substitutions)-TAKE AS DIRECTED.   nitroGLYCERIN  (NITROSTAT ) 0.4 MG SL tablet Place 1 tab under tongue for chest pain.  May repeat after 5 minutes x 2.  DO NOT TAKE MORE THAN 3 TABS DURING AN EPISODE OF CHEST PAIN   potassium chloride  (KLOR-CON ) 10 MEQ tablet TAKE 2 TABLETS BY MOUTH DAILY   Semaglutide ,0.25 or 0.5MG /DOS, (OZEMPIC , 0.25 OR 0.5 MG/DOSE,) 2 MG/3ML SOPN Inject 0.5 mg into the skin once a week.   No current facility-administered medications for this visit. (Other)   REVIEW OF SYSTEMS: ROS   Positive for: Endocrine, Cardiovascular, Eyes Negative for: Constitutional,  Gastrointestinal, Neurological, Skin, Genitourinary, Musculoskeletal, HENT, Respiratory, Psychiatric, Allergic/Imm, Heme/Lymph Last edited by Resa Delon ORN, COT on 06/08/2023  9:44 AM.     ALLERGIES Allergies  Allergen Reactions   Jardiance  [Empagliflozin ] Other (See Comments)    Frequent yeast infection   PAST MEDICAL HISTORY Past Medical History:  Diagnosis Date   Adrenal adenoma, left    Arthritis    Cancer (HCC)    CKD (chronic kidney disease), stage II    Coronary artery disease 07-28-2018 followed [facility]  currently due to no insurance   per cardiac cath 02-06-2015 (positive mild lateral ishcemia on stress test)--- dLAD 80%,  mLAD 40%,  mPDA 80% (small vessel),  ostial D1 70%,  CFx with lumial irregarlities-- medical management   Depression    Diabetic neuropathy (HCC)    Diabetic retinopathy (HCC)    NPDR OU   Headache    History of Bell's palsy 07/2011   per pt residual facial pain on left side occasionally   History of cancer of vagina 1999   per pt completed radiation and chemo   History of sepsis 12/30/2017   positive blood culter fro E.coli   Hyperlipidemia    Hypertension    Hypertensive retinopathy    OU   Hypokalemia    Insulin  dependent type 2 diabetes mellitus, uncontrolled    followed by pcp---  A1c was 11.7 on 06-15-2018 in epic   Myocardial infarction [facility])    10 yrs ago?   Nocturia    Peripheral neuropathy    PMB (postmenopausal bleeding)    Wears dentures    upper   Wears glasses    Past Surgical History:  Procedure Laterality Date   BREAST BIOPSY  2012   benign   CARDIAC CATHETERIZATION N/A 02/06/2015   Procedure: Left Heart Cath and Coronary Angiography;  Surgeon: Ezra GORMAN Shuck, MD;  [facility] INVASIVE CV LAB;  Service: Cardiovascular;  Laterality: N/A;   CATARACT EXTRACTION Bilateral OD: 03/07/19, OS: 02/28/19   Dr. Roz   CATARACT EXTRACTION, BILATERAL     COLONOSCOPY     normal exam in [place],  [place] abour 10-12 years ago   EYE SURGERY Bilateral OD: 03/07/19, OS: 02/28/19   Cat Sx - Dr. Roz   HYSTEROSCOPY WITH D & C N/A 08/01/2018   Procedure: DILATATION AND CURETTAGE /HYSTEROSCOPY;  Surgeon: Alger Gong, MD;  Location: [facility];  Service: Gynecology;  Laterality: N/A;   ROBOTIC ASSISTED TOTAL HYSTERECTOMY WITH BILATERAL SALPINGO OOPHERECTOMY N/A 11/28/2018   Procedure: XI ROBOTIC ASSISTED TOTAL HYSTERECTOMY WITH BILATERAL SALPINGO OOPHORECTOMY;  Surgeon: Eloy Herring, MD;  Location: [facility];  Service: Gynecology;  Laterality: N/A;   SENTINEL NODE BIOPSY N/A 11/28/2018   Procedure: SENTINEL NODE BIOPSY;  Surgeon: Eloy Herring, MD;  Location: [facility];  Service: Gynecology;  Laterality: N/A;  UMBILICAL HERNIA REPAIR  child   FAMILY HISTORY Family History  Problem Relation Age of Onset   Heart disease Mother    Hypertension Mother    Diabetes Mother    Cancer Father        hodgkins lymphoma   Heart disease Sister        heart attack   Diabetes Sister    Colon cancer Brother 82   Diabetes Maternal Aunt    Diabetes Maternal Uncle    Cancer Other        parent   Diabetes Other        parent   Heart disease Other        parent   Hyperlipidemia Other        parent   Hypertension Other        parent   Arthritis Other        parent   Breast cancer Neg Hx    Colon polyps Neg Hx    Esophageal cancer Neg Hx    Rectal cancer Neg Hx    Stomach cancer Neg Hx    SOCIAL HISTORY Social History   Tobacco Use   Smoking status: Never   Smokeless tobacco: Never  Vaping Use   Vaping status: Never Used  Substance Use Topics   Alcohol  use: Not Currently   Drug use: Not Currently    Types: Marijuana    Comment: every 2 weeks per pt       OPHTHALMIC EXAM:  Base Eye Exam     Visual Acuity (Snellen - Linear)       Right Left   Dist [place] 20/20 20/150 -2   Dist ph cc  NI         Tonometry (Tonopen, 9:47 AM)       Right Left   Pressure 12 13          Pupils       Pupils Dark Light Shape React APD   Right PERRL 3 2 Round Brisk None   Left PERRL 3 2 Round Brisk None         Visual Fields       Left Right    Full Full         Extraocular Movement       Right Left    Full, Ortho Full, Ortho         Neuro/Psych     Oriented x3: Yes   Mood/Affect: Normal         Dilation     Both eyes: 2.5% Phenylephrine  @ 9:47 AM           Slit Lamp and Fundus Exam     Slit Lamp Exam       Right Left   Lids/Lashes Dermatochalasis - upper lid Dermatochalasis - upper lid   Conjunctiva/Sclera Mild Melanosis Mild Melanosis   Cornea 1+ Punctate epithelial erosions 2+ central punctate epithelial erosions, decreased TBUT, mild tear film debris, well healed temporal cataract wounds   Anterior Chamber Deep, 0.5+cell/pigment Deep, 0.5+cell/pigment   Iris Round and dilated, No NVI Round and dilated, No NVI   Lens PC IOL in good position with trace PCO PC IOL in good position with 1+PCO   Anterior Vitreous Vitreous syneresis, Posterior vitreous detachment Vitreous syneresis, Posterior vitreous detachment, vitreous condensations         Fundus Exam       Right Left   Disc Pink and Sharp Compact, Pink and Sharp,  temporal PPA   C/D Ratio 0.1 0.1   Macula Good foveal reflex, persistent cystic changes / edema inferior macula, minimal Microaneurysms Blunted foveal reflex, central edema - slightly improved, +exudates/MA/IRH -- greatest temporal macula -- stably improved, +central atrophy, no heme   Vessels attenuated, Tortuous attenuated, Tortuous   Periphery Attached, scattered drusen, rare scattered DBH, reticular degeneration Attached, scattered drusen, scattered DBH--greatest nasal to disc, reticular degeneration           IMAGING AND PROCEDURES  Imaging and Procedures for 06/08/2023  OCT, Retina - OU - Both Eyes       Right Eye Quality was good. Central Foveal Thickness: 259. Progression has been stable. Findings  include no SRF, abnormal foveal contour, intraretinal hyper-reflective material, intraretinal fluid, vitreomacular adhesion (Persistent IRF/cystic changes inferior macula and fovea -).   Left Eye Quality was good. Central Foveal Thickness: 160. Progression has improved. Findings include no SRF, abnormal foveal contour, retinal drusen , subretinal hyper-reflective material, intraretinal hyper-reflective material, intraretinal fluid, outer retinal atrophy, vitreomacular adhesion (Mild interval improvement in IRF/IRHM inferior and temporal mac and fovea, +central ORA).   Notes *Images captured and stored on drive  Diagnosis / Impression:  Macular edema OU - likely combination of CME + DME OU OD: Persistent IRF/cystic changes inferior macula and fovea OS: interval improvement in IRF/IRHM inferior and temporal mac and fovea, +central ORA  Clinical management:  See below  Abbreviations: NFP - Normal foveal profile. CME - cystoid macular edema. PED - pigment epithelial detachment. IRF - intraretinal fluid. SRF - subretinal fluid. EZ - ellipsoid zone. ERM - epiretinal membrane. ORA - outer retinal atrophy. ORT - outer retinal tubulation. SRHM - subretinal hyper-reflective material      Intravitreal Injection, Pharmacologic Agent - OD - Right Eye       Time Out 06/08/2023. 10:39 AM. Confirmed correct patient, procedure, site, and patient consented.   Anesthesia Topical anesthesia was used. Anesthetic medications included Lidocaine  2%, Proparacaine 0.5%.   Procedure Preparation included 5% betadine to ocular surface, eyelid speculum. A supplied (32g) needle was used.   Injection: 1.25 mg Bevacizumab  1.25mg /0.46ml   Route: Intravitreal, Site: Right Eye   NDC: H525437, Lot: [id number], Expiration date: 07/18/2023   Post-op Post injection exam found visual acuity of at least counting fingers. The patient tolerated the procedure well. There were no complications. The patient received written  and verbal post procedure care education.      Intravitreal Injection, Pharmacologic Agent - OS - Left Eye       Time Out 06/08/2023. 10:39 AM. Confirmed correct patient, procedure, site, and patient consented.   Anesthesia Topical anesthesia was used. Anesthetic medications included Lidocaine  2%, Proparacaine 0.5%.   Procedure Preparation included 5% betadine to ocular surface, eyelid speculum. A (32g) needle was used.   Injection: 6 mg faricimab -svoa 6 MG/0.05ML   Route: Intravitreal, Site: Left Eye   NDC: 49757-903-98, Lot: [id number], Expiration date: 11/28/2023, Waste: 0 mL   Post-op Post injection exam found visual acuity of at least counting fingers. The patient tolerated the procedure well. There were no complications. The patient received written and verbal post procedure care education. Post injection medications were not given.   Notes **SAMPLE MEDICATION ADMINISTERED**            ASSESSMENT/PLAN:    ICD-10-CM   1. Moderate nonproliferative diabetic retinopathy of both eyes with macular edema associated with type 2 diabetes mellitus (HCC)  E11.3313 OCT, Retina - OU - Both Eyes    Intravitreal Injection,  Pharmacologic Agent - OD - Right Eye    Intravitreal Injection, Pharmacologic Agent - OS - Left Eye    faricimab -svoa (VABYSMO ) 6mg /0.23mL intravitreal injection    Bevacizumab  (AVASTIN ) SOLN 1.25 mg    2. Current use of insulin  (HCC)  Z79.4     3. Long-term (current) use of injectable non-insulin  antidiabetic drugs  Z79.85     4. CME (cystoid macular edema), bilateral  H35.353     5. Essential hypertension  I10     6. Hypertensive retinopathy of both eyes  H35.033     7. Pseudophakia of both eyes  Z96.1     8. 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 Dry eyes, bilateral  H04.123      1-4. Moderate Non-proliferative diabetic retinopathy, OU OD with combination DME/CME  - last A1c 8.7 (11.12.24), 8.2 (03.08.24); 9.6 (08.07.23), 10.1 (1.31.23)  -  pt now on Ozempic  as of Mar 2024 - h/o delayed to follow up from September 2021 to April 2022--7 mos instead of 4 wks**             - exam shows scattered IRH, edema and tortuous blood vessels OU - FA (10.23.20) shows leaking MA OU and paracentral petaloid hyperfluorecence OD --> CME/[place] - s/p IVA OD #1 (11.06.20), #2 (12.18.20), #3 (02.24.21), #4 (03.24.21), #5 (04.21.21), #6 (5.26.21), #7 (07.26.21), #8 (08.25.21), #9 (09.23.21), #10 (04.26.22) -- IVA resistance - s/p IVA OS #1 (10.23.20), #2 (11.18.20), #3 (12.18.20), #4 (02.24.21), #5 (03.24.21) -- IVA resistance  ============================== - s/p IVE OD #1 (05.25.22), #2 (07.01.22), #3 (07.29.22), #4 (08.29.22 - JDM), #5 (09.26.22), #6 (10.24.22), #7 (11.21.22), #8 (12.19.22), #9 (01.23.23), #10 (02.20.23), #11 (03.22.23), #12 (04.19.23), #13 (05.17.23), #14 (06.14.23), #15 (08.04.23), #16 (09.01.23), #17 (10.10.23), #18 (11.14.23), #19 (12.19.23), #20 (02.13.24), #21 (03.20.24), #22 (03.20.24), #23 (03.20.24), #24 (04.24.24) #25(06.28.24), #26 (08.16.24), #27 (10.02.24), #28 (11.20.24) - s/p IVE OS #1 (04.21.21), #2 (5.26.21), #3 (07.26.21), #4 (08.25.21), #5 (09.23.21), #6 (04.26.22), #7 (05.25.22), #8 (07.01.22), #9 (07.29.22), #10 (08.29.22 - JDM), #11 (09.26.22), #12 (10.24.22) -- IVE resistance ============================= - s/p IVV Vabysmo  OS #1 (11.21.22, sample), #2 (12.19.22), #3 (01.23.23), #4 (02.20.23), #5 (03.22.23), #6 (04.19.23), #7 (05.17.23), #8 (06.14.23, sample), #9 (08.04.23), #10 (9.01.23), #11 (10.10.23), #12 (11.14.23), #13 (12.19.23), #14 (02.13.24), #15 (03.20.24) #16(3.20.24), #17 (03.20.24), #18 (04.24.24) #19(06.28.24), #20 (08.16.24), #21 (10.02.24), #22 (11.20.24) - BCVA OD 20/20; OS 20/150 - stable OU - OCT today: OD: Persistent IRF/cystic changes inferior macula and fovea, OS: interval improvement in IRF/IRHM inferior and temporal mac and fovea, +central ORA at 7 weeks - recommend IVA OD #11 and IVV OS  #23 [sample] today, 01.08.24 with follow up in 7 weeks again - switching back to Avastin  OD and using Vabysmo  sample OS - due to lack of funds from chronic disease fund / [employer]  - pt wishes to proceed w/ injections             - RBA of procedure discussed, questions answered  - Eylea  OU informed consent form re-signed and scanned on 8.16.24 (OD) - Vabysmo  OS consent form signed and scanned on 11.20.24 - Avastin  OD informed consent signed and scanned on 01.08.25 - see procedure note             - continue Ketorolac  QID OU             - f/u in 7 weeks, DFE, OCT, possible injection OU   5,6. Hypertensive retinopathy OU             -  discussed importance of tight BP control             - continue to monitor   7. Pseudophakia OU            - s/p CE/IOL OU [place]), OD: 03/07/2019, OS: 02/28/2019             - IOLs in good position             - CME OU as above             - continue to monitor   8. Ocular Hypertension OU             - IOP: 12, 13 - h/o steroid response             - cont Cosopt  BID OU and Brimonidine  BID OU  9. Dry eyes OU - recommend artificial tears and lubricating ointment as needed   Ophthalmic Meds Ordered this visit:  Meds ordered this encounter  Medications   faricimab -svoa (VABYSMO ) 6mg /0.70mL intravitreal injection   Bevacizumab  (AVASTIN ) SOLN 1.25 mg     Return in about 7 weeks (around 07/27/2023) for f/u NPDR OU , DFE, OCT, Possible injections.  There are no Patient Instructions on file for this visit.   This document serves as a record of services personally performed by Redell JUDITHANN Hans, MD, PhD. It was created on their behalf by Wanda GEANNIE Keens, COT an ophthalmic technician. The creation of this record is the provider's dictation and/or activities during the visit.    Electronically signed by:  Wanda GEANNIE Keens, COT  06/08/23 12:13 PM  Redell JUDITHANN Hans, M.D., Ph.D. Diseases & Surgery of the Retina and Vitreous [facility]  I have reviewed the above documentation for accuracy and completeness, and I agree with the above. Redell JUDITHANN Hans, M.D., Ph.D. 06/08/23 12:14 PM   Abbreviations: M myopia (nearsighted); A astigmatism; H hyperopia (farsighted); P presbyopia; Mrx spectacle prescription;  CTL contact lenses; OD right eye; OS left eye; OU both eyes  XT exotropia; ET esotropia; PEK punctate epithelial keratitis; PEE punctate epithelial erosions; DES dry eye syndrome; MGD meibomian gland dysfunction; ATs artificial tears; PFAT's preservative free artificial tears; NSC nuclear sclerotic cataract; PSC posterior subcapsular cataract; ERM epi-retinal membrane; PVD posterior vitreous detachment; RD retinal detachment; DM diabetes mellitus; DR diabetic retinopathy; NPDR non-proliferative diabetic retinopathy; PDR proliferative diabetic retinopathy; CSME clinically significant macular edema; DME diabetic macular edema; dbh dot blot hemorrhages; CWS cotton wool spot; POAG primary open angle glaucoma; C/D cup-to-disc ratio; HVF humphrey visual field; GVF goldmann visual field; OCT optical coherence tomography; IOP intraocular pressure; BRVO Branch retinal vein occlusion; CRVO central retinal vein occlusion; CRAO central retinal artery occlusion; BRAO branch retinal artery occlusion; RT retinal tear; SB scleral buckle; PPV pars plana vitrectomy; VH Vitreous hemorrhage; PRP panretinal laser photocoagulation; IVK intravitreal kenalog; VMT vitreomacular traction; MH Macular hole;  NVD neovascularization of the disc; NVE neovascularization elsewhere; AREDS age related eye disease study; ARMD age related macular degeneration; POAG primary open angle glaucoma; EBMD epithelial/anterior basement membrane dystrophy; ACIOL anterior chamber intraocular lens; IOL intraocular lens; PCIOL posterior chamber intraocular lens; Phaco/IOL phacoemulsification with intraocular lens placement; PRK photorefractive keratectomy; LASIK laser assisted in  situ keratomileusis; HTN hypertension; DM diabetes mellitus; COPD chronic obstructive pulmonary disease

## 2023-06-08 ENCOUNTER — Other Ambulatory Visit: Payer: Self-pay

## 2023-06-08 ENCOUNTER — Ambulatory Visit (INDEPENDENT_AMBULATORY_CARE_PROVIDER_SITE_OTHER): Payer: 59 | Admitting: Ophthalmology

## 2023-06-08 ENCOUNTER — Encounter (INDEPENDENT_AMBULATORY_CARE_PROVIDER_SITE_OTHER): Payer: Self-pay | Admitting: Ophthalmology

## 2023-06-08 DIAGNOSIS — H35033 Hypertensive retinopathy, bilateral: Secondary | ICD-10-CM | POA: Diagnosis not present

## 2023-06-08 DIAGNOSIS — E113313 Type 2 diabetes mellitus with moderate nonproliferative diabetic retinopathy with macular edema, bilateral: Secondary | ICD-10-CM | POA: Diagnosis not present

## 2023-06-08 DIAGNOSIS — I10 Essential (primary) hypertension: Secondary | ICD-10-CM | POA: Diagnosis not present

## 2023-06-08 DIAGNOSIS — H04123 Dry eye syndrome of bilateral lacrimal glands: Secondary | ICD-10-CM | POA: Diagnosis not present

## 2023-06-08 DIAGNOSIS — H35353 Cystoid macular degeneration, bilateral: Secondary | ICD-10-CM

## 2023-06-08 DIAGNOSIS — Z794 Long term (current) use of insulin: Secondary | ICD-10-CM | POA: Diagnosis not present

## 2023-06-08 DIAGNOSIS — Z7985 Long-term (current) use of injectable non-insulin antidiabetic drugs: Secondary | ICD-10-CM

## 2023-06-08 DIAGNOSIS — Z961 Presence of intraocular lens: Secondary | ICD-10-CM | POA: Diagnosis not present

## 2023-06-08 DIAGNOSIS — H40053 Ocular hypertension, bilateral: Secondary | ICD-10-CM

## 2023-06-08 MED ORDER — BEVACIZUMAB CHEMO INJECTION 1.25MG/0.05ML SYRINGE FOR KALEIDOSCOPE
1.2500 mg | INTRAVITREAL | Status: AC | PRN
  Administered 2023-06-08: 1.25 mg via INTRAVITREAL

## 2023-06-08 MED ORDER — FARICIMAB-SVOA 6 MG/0.05ML IZ SOLN
6.0000 mg | INTRAVITREAL | Status: AC | PRN
  Administered 2023-06-08: 6 mg via INTRAVITREAL

## 2023-06-13 ENCOUNTER — Other Ambulatory Visit: Payer: Self-pay

## 2023-06-13 ENCOUNTER — Other Ambulatory Visit (INDEPENDENT_AMBULATORY_CARE_PROVIDER_SITE_OTHER): Payer: Self-pay | Admitting: Ophthalmology

## 2023-06-15 ENCOUNTER — Other Ambulatory Visit: Payer: Self-pay | Admitting: Pharmacist

## 2023-06-15 ENCOUNTER — Other Ambulatory Visit: Payer: Self-pay

## 2023-06-15 NOTE — Progress Notes (Signed)
 Pharmacy TNM Diabetes Measure Review  S:  Patient was identified in a report as being at risk for failing the True [place] Metric of A1c control (<8%) in [place] and African American patients. Last A1c was 8.7. Last PCP visit was 04/12/23.  Call placed to patient to discuss diabetes control and medication management. Patient has been seen by the clinical pharmacist before with most recent visit 10/16/2020. She is in good spirits today. Tolerating Ozempic  well at the 0.5 mg dose. Denies any changes in vision, NV, or abdominal pain.  Current diabetes medications include: Lantus  58 units once daily, Humalog  28 units BID before meals, Ozempic  0.5 mg weekly Patient reports adherence to taking all medications as prescribed.   Insurance coverage: [employer]  Patient denies hypoglycemic events.  Reported home fasting blood sugars: 130s-150s  Reported 2 hour post.  O:   Lab Results  Component Value Date   HGBA1C 8.7 (A) 04/12/2023   There were no vitals filed for this visit.  Lipid Panel     Component Value Date/Time   CHOL 126 08/06/2022 1125   TRIG 110 08/06/2022 1125   HDL 44 08/06/2022 1125   CHOLHDL 2.9 08/06/2022 1125   CHOLHDL 5 07/15/2015 1513   VLDL 44.0 (H) 07/15/2015 1513   LDLCALC 62 08/06/2022 1125   LDLDIRECT 93.0 07/15/2015 1513    Clinical Atherosclerotic Cardiovascular Disease (ASCVD): No  The ASCVD Risk score (Arnett DK, et al., 2019) failed to calculate for the following reasons:   The valid total cholesterol range is 130 to 320 mg/dL   Patient is participating in a Managed Medicaid Plan: No   A/P: Diabetes longstanding currently uncontrolled based on A1c. Home sugars seem to be improving with Ozempic . Patient is able to verbalize appropriate hypoglycemia management plan but is not currently hypoglycemic at this time. Medication adherence appears to be appropriate. -Continued Lantus  58 units daily.  -Decreased Humalog  (insulin  lispro) from 28u to 24 units  BID before meals.  -Increased Ozempic  from 0.5 mg weekly to 1mg  weekly.  -Patient educated on purpose, proper use, and potential adverse effects of Ozempic , insulin .  -Extensively discussed pathophysiology of diabetes, recommended lifestyle interventions, dietary effects on blood sugar control.  -Counseled on s/sx of and management of hypoglycemia.  -Next A1c anticipated 07/2023.   Follow-up:  Pharmacist call in 2 weeks.

## 2023-06-17 ENCOUNTER — Other Ambulatory Visit (HOSPITAL_COMMUNITY): Payer: Self-pay

## 2023-06-17 ENCOUNTER — Other Ambulatory Visit: Payer: Self-pay

## 2023-06-20 ENCOUNTER — Other Ambulatory Visit: Payer: Self-pay

## 2023-06-25 ENCOUNTER — Other Ambulatory Visit: Payer: Self-pay | Admitting: Internal Medicine

## 2023-06-25 ENCOUNTER — Other Ambulatory Visit: Payer: Self-pay | Admitting: Cardiology

## 2023-06-26 MED ORDER — DORZOLAMIDE HCL-TIMOLOL MAL 2-0.5 % OP SOLN
1.0000 [drp] | Freq: Two times a day (BID) | OPHTHALMIC | 3 refills | Status: AC
  Filled 2023-06-26: qty 10, 50d supply, fill #0
  Filled 2023-07-07: qty 10, 100d supply, fill #0
  Filled 2023-07-19 – 2023-09-03 (×2): qty 10, 50d supply, fill #1
  Filled 2023-09-15: qty 10, 100d supply, fill #0
  Filled 2023-12-14 (×2): qty 10, 100d supply, fill #1
  Filled 2023-12-21: qty 10, 50d supply, fill #0
  Filled 2024-03-06 – 2024-03-21 (×2): qty 10, 50d supply, fill #1

## 2023-06-27 ENCOUNTER — Other Ambulatory Visit (HOSPITAL_COMMUNITY): Payer: Self-pay

## 2023-06-27 ENCOUNTER — Other Ambulatory Visit: Payer: Self-pay

## 2023-06-27 ENCOUNTER — Other Ambulatory Visit: Payer: Self-pay | Admitting: Pharmacist

## 2023-06-27 MED ORDER — ISOSORBIDE MONONITRATE ER 60 MG PO TB24
60.0000 mg | ORAL_TABLET | Freq: Every day | ORAL | 3 refills | Status: AC
  Filled 2023-06-27: qty 90, 90d supply, fill #0
  Filled 2023-07-12: qty 90, 90d supply, fill #1
  Filled 2023-12-14: qty 90, 90d supply, fill #0
  Filled 2023-12-14: qty 90, 90d supply, fill #1
  Filled 2023-12-21: qty 90, 90d supply, fill #0
  Filled 2024-03-13: qty 90, 90d supply, fill #1
  Filled 2024-06-13: qty 90, 90d supply, fill #2

## 2023-06-27 MED ORDER — SEMAGLUTIDE (1 MG/DOSE) 4 MG/3ML ~~LOC~~ SOPN
1.0000 mg | PEN_INJECTOR | SUBCUTANEOUS | 2 refills | Status: DC
  Filled 2023-06-27 – 2023-07-07 (×2): qty 3, 28d supply, fill #0

## 2023-06-27 NOTE — Telephone Encounter (Signed)
Requested by interface surescripts. Last dispensed 05/31/23 #15 ml 3 refills.  Requested Prescriptions  Refused Prescriptions Disp Refills   insulin lispro (HUMALOG KWIKPEN) 100 UNIT/ML KwikPen 15 mL 3    Sig: Inject 28 Units into the skin in the morning and at bedtime.     Endocrinology:  Diabetes - Insulins Failed - 06/27/2023  1:24 PM      Failed - HBA1C is between 0 and 7.9 and within 180 days    HbA1c, POC (controlled diabetic range)  Date Value Ref Range Status  04/12/2023 8.7 (A) 0.0 - 7.0 % Final         Passed - Valid encounter within last 6 months    Recent Outpatient Visits           2 months ago Type 2 diabetes mellitus with morbid obesity (HCC)   [facility] Dept Of [place]. [facility] Jonah Blue B, MD   10 months ago Type 2 diabetes mellitus with morbid obesity [facility])   [facility] - [facility] Dept Of [place]. [facility] Jonah Blue B, MD   1 year ago Type 2 diabetes mellitus with morbid obesity [facility])   [facility] - A Dept Of [place]. [facility] Jonah Blue B, MD   1 year ago Type 2 diabetes mellitus with morbid obesity [facility])   [facility] - A Dept Of [place]. [facility] Marcine Matar, MD   1 year ago Erroneous encounter - disregard   [facility] - A Dept Of [place]. [facility] Marcine Matar, MD       Future Appointments             In 1 month Laural Benes Binnie Rail, MD [facility] - A Dept Of Eligha Bridegroom. [facility]

## 2023-06-27 NOTE — Telephone Encounter (Signed)
Called patient to clarify  pharmacy medications have been sent to in the past. Patient reports she would like all medications refilled at [facility] and to have medications delivered. Pt reports she is out of Ozempic and reports "Franky Macho" is supposed to be readjusting her dose.

## 2023-06-28 ENCOUNTER — Other Ambulatory Visit: Payer: Self-pay

## 2023-07-06 ENCOUNTER — Other Ambulatory Visit: Payer: Self-pay

## 2023-07-07 ENCOUNTER — Other Ambulatory Visit: Payer: Self-pay

## 2023-07-07 ENCOUNTER — Other Ambulatory Visit (HOSPITAL_COMMUNITY): Payer: Self-pay

## 2023-07-07 ENCOUNTER — Other Ambulatory Visit: Payer: Self-pay | Admitting: Internal Medicine

## 2023-07-07 NOTE — Telephone Encounter (Signed)
 Requested by interface surescripts. Last dispensed 05/31/23. Too soon for refill. Requested Prescriptions  Refused Prescriptions Disp Refills   insulin  lispro (HUMALOG  KWIKPEN) 100 UNIT/ML KwikPen 15 mL 3    Sig: Inject 28 Units into the skin in the morning and at bedtime.     Endocrinology:  Diabetes - Insulins Failed - 07/07/2023  1:39 PM      Failed - HBA1C is between 0 and 7.9 and within 180 days    HbA1c, POC (controlled diabetic range)  Date Value Ref Range Status  04/12/2023 8.7 (A) 0.0 - 7.0 % Final         Passed - Valid encounter within last 6 months    Recent Outpatient Visits           2 months ago Type 2 diabetes mellitus with morbid obesity (HCC)   [place] [facility] Dept Of [place]. [facility] Vicci Sober B, MD   11 months ago Type 2 diabetes mellitus with morbid obesity [facility])   [place] [facility] - A Dept Of [place]. [facility] Vicci Sober B, MD   1 year ago Type 2 diabetes mellitus with morbid obesity [facility])   [place] [facility] - A Dept Of [place]. [facility] Vicci Sober B, MD   1 year ago Type 2 diabetes mellitus with morbid obesity [facility])   [place] [facility] - A Dept Of [place]. [facility] Vicci Sober NOVAK, MD   1 year ago Erroneous encounter - disregard   [place] [facility] [place] - A Dept Of [place]. [facility] Vicci Sober NOVAK, MD       Future Appointments             In 1 month Vicci Sober NOVAK, MD [facility] [place] - A Dept Of [place]. [facility]

## 2023-07-12 ENCOUNTER — Other Ambulatory Visit: Payer: Self-pay | Admitting: Pharmacist

## 2023-07-12 ENCOUNTER — Other Ambulatory Visit (HOSPITAL_COMMUNITY): Payer: Self-pay

## 2023-07-12 ENCOUNTER — Other Ambulatory Visit: Payer: Self-pay

## 2023-07-12 ENCOUNTER — Other Ambulatory Visit: Payer: Self-pay | Admitting: Internal Medicine

## 2023-07-12 MED ORDER — INSULIN LISPRO (1 UNIT DIAL) 100 UNIT/ML (KWIKPEN)
24.0000 [IU] | PEN_INJECTOR | Freq: Two times a day (BID) | SUBCUTANEOUS | 3 refills | Status: DC
  Filled 2023-07-12: qty 15, 31d supply, fill #0
  Filled 2023-08-19: qty 15, 31d supply, fill #1

## 2023-07-12 NOTE — Telephone Encounter (Signed)
Requested medication (s) are due for refill today: na  Requested medication (s) are on the active medication list: yes  Last refill:  03/01/23 #15 ml 3 refills  Future visit scheduled: yes in 1 month   Notes to clinic:  please advise if dose should be decreased. Last pharmacy visit 06/15/23 recommends to decrease from 28 units to 24 units. Please clarify .     Requested Prescriptions  Pending Prescriptions Disp Refills   insulin lispro (HUMALOG KWIKPEN) 100 UNIT/ML KwikPen 15 mL 3    Sig: Inject 28 Units into the skin in the morning and at bedtime.     Endocrinology:  Diabetes - Insulins Failed - 07/12/2023 12:21 PM      Failed - HBA1C is between 0 and 7.9 and within 180 days    HbA1c, POC (controlled diabetic range)  Date Value Ref Range Status  04/12/2023 8.7 (A) 0.0 - 7.0 % Final         Passed - Valid encounter within last 6 months    Recent Outpatient Visits           3 months ago Type 2 diabetes mellitus with morbid obesity (HCC)   [place] [facility] Dept Of [place]. [facility] Jonah Blue B, MD   11 months ago Type 2 diabetes mellitus with morbid obesity [facility])   [place] [facility] - A Dept Of [place]. [facility] Jonah Blue B, MD   1 year ago Type 2 diabetes mellitus with morbid obesity [facility])   [place] [facility] - A Dept Of [place]. [facility] Jonah Blue B, MD   1 year ago Type 2 diabetes mellitus with morbid obesity [facility])   [place] [facility] - A Dept Of [place]. [facility] Marcine Matar, MD   1 year ago Erroneous encounter - disregard   [place] [facility] [place] - A Dept Of [place]. [facility] Marcine Matar, MD       Future Appointments             In 1 month Laural Benes Binnie Rail, MD [facility] [place] - A Dept Of [place]igha Bridegroom. [facility]

## 2023-07-13 ENCOUNTER — Other Ambulatory Visit (HOSPITAL_COMMUNITY): Payer: Self-pay

## 2023-07-13 ENCOUNTER — Other Ambulatory Visit: Payer: Self-pay

## 2023-07-14 ENCOUNTER — Other Ambulatory Visit: Payer: Self-pay

## 2023-07-19 ENCOUNTER — Other Ambulatory Visit: Payer: Self-pay

## 2023-07-19 ENCOUNTER — Other Ambulatory Visit (HOSPITAL_COMMUNITY): Payer: Self-pay

## 2023-07-20 ENCOUNTER — Other Ambulatory Visit (HOSPITAL_BASED_OUTPATIENT_CLINIC_OR_DEPARTMENT_OTHER): Payer: 59 | Admitting: Pharmacist

## 2023-07-20 ENCOUNTER — Other Ambulatory Visit: Payer: Self-pay | Admitting: Pharmacist

## 2023-07-20 DIAGNOSIS — Z794 Long term (current) use of insulin: Secondary | ICD-10-CM

## 2023-07-20 DIAGNOSIS — Z7985 Long-term (current) use of injectable non-insulin antidiabetic drugs: Secondary | ICD-10-CM

## 2023-07-20 DIAGNOSIS — E1169 Type 2 diabetes mellitus with other specified complication: Secondary | ICD-10-CM

## 2023-07-20 MED ORDER — SEMAGLUTIDE (2 MG/DOSE) 8 MG/3ML ~~LOC~~ SOPN
2.0000 mg | PEN_INJECTOR | SUBCUTANEOUS | 1 refills | Status: DC
  Filled 2023-07-20: qty 9, 84d supply, fill #0

## 2023-07-20 NOTE — Progress Notes (Signed)
Pharmacy TNM Diabetes Measure Review  S:  Patient was identified in a report as being at risk for failing the True [place] Metric of A1c control (<8%) in [place] and African American patients. Last A1c was 8.7. Last PCP visit was 04/12/23.  Call placed to patient to discuss diabetes control and medication management. I spoke with her last month and increase Ozempic dose. We also decrease Humalog dose. She is in good spirits today. Tolerating Ozempic well at the 1mg  dose. Denies any changes in vision, NV, or abdominal pain.  Current diabetes medications include: Lantus 58 units once daily, Humalog 24 units BID before meals, Ozempic 1 mg weekly Patient reports adherence to taking all medications as prescribed.   Insurance coverage: [employer]  Patient denies hypoglycemic events.  Reported home fasting blood sugars: 160s - 200s.   O:   Lab Results  Component Value Date   HGBA1C 8.7 (A) 04/12/2023   There were no vitals filed for this visit.  Lipid Panel     Component Value Date/Time   CHOL 126 08/06/2022 1125   TRIG 110 08/06/2022 1125   HDL 44 08/06/2022 1125   CHOLHDL 2.9 08/06/2022 1125   CHOLHDL 5 07/15/2015 1513   VLDL 44.0 (H) 07/15/2015 1513   LDLCALC 62 08/06/2022 1125   LDLDIRECT 93.0 07/15/2015 1513    Clinical Atherosclerotic Cardiovascular Disease (ASCVD): No  The ASCVD Risk score (Arnett DK, et al., 2019) failed to calculate for the following reasons:   The valid total cholesterol range is 130 to 320 mg/dL   Patient is participating in a Managed Medicaid Plan: No   A/P: Diabetes longstanding currently uncontrolled based on A1c. Home sugars seem to be improving with Ozempic. Patient is able to verbalize appropriate hypoglycemia management plan but is not currently hypoglycemic at this time. Medication adherence appears to be appropriate. -Continued Lantus 58 units daily.  -Continued Humalog (insulin lispro) 24 units BID before meals.  -Increased Ozempic  from 1 mg weekly to 2 mg weekly.  -Patient educated on purpose, proper use, and potential adverse effects of Ozempic, insulin.  -Extensively discussed pathophysiology of diabetes, recommended lifestyle interventions, dietary effects on blood sugar control.  -Counseled on s/sx of and management of hypoglycemia.  -Next A1c anticipated 07/2023.   Follow-up:  PCP: 1 month  Butch Penny, PharmD, [place], CPP Clinical Pharmacist [facility] [phone]

## 2023-07-21 ENCOUNTER — Other Ambulatory Visit (HOSPITAL_COMMUNITY): Payer: Self-pay

## 2023-07-21 ENCOUNTER — Other Ambulatory Visit: Payer: Self-pay

## 2023-07-26 NOTE — Progress Notes (Signed)
 [facility] - Clinic Note  07/27/2023     CHIEF COMPLAINT Patient presents for Retina Follow Up   HISTORY OF PRESENT ILLNESS: Kimberly Frazier is a 63 y.o. female who presents to the clinic today for:   HPI     Retina Follow Up   Patient presents with  Diabetic Retinopathy.  In both eyes.  This started 7 weeks ago.  Duration of 7 weeks.  Since onset it is stable.  I, the attending physician,  performed the HPI with the patient and updated documentation appropriately.        Comments   7 week retina follow up NPDR IVA OD and IVV OS sample pt is reporting vision is little blurred at times she denies any flashes or floaters pt last reading 144 today she is using  Cosopt BID OU and Brimonidine BID OU      Last edited by Rennis Chris, MD on 07/27/2023  6:28 PM.    Pt states vision is stable, she is on Ozempic, but her blood sugar is still running high  Referring physician: Marcine Matar, MD [address] [place],  [place] [address]  HISTORICAL INFORMATION:   Selected notes from the MEDICAL RECORD NUMBER Referred by Dr. Jethro Bolus for concern of CME    CURRENT MEDICATIONS: Current Outpatient Medications (Ophthalmic Drugs)  Medication Sig   brimonidine (ALPHAGAN) 0.15 % ophthalmic solution Place 1 drop into both eyes 2 (two) times daily.   brimonidine (ALPHAGAN) 0.2 % ophthalmic solution Place 1 drop into both eyes 2 (two) times daily.   dorzolamide-timolol (COSOPT) 2-0.5 % ophthalmic solution Place 1 drop into both eyes 2 (two) times daily.   ketorolac (ACULAR) 0.5 % ophthalmic solution Place 1 drop into both eyes 4 (four) times daily.   prednisoLONE acetate (PRED FORTE) 1 % ophthalmic suspension Place 1 drop into both eyes daily.   No current facility-administered medications for this visit. (Ophthalmic Drugs)   Current Outpatient Medications (Other)  Medication Sig   Accu-Chek Softclix Lancets lancets Use to check blood sugar 3 times  daily.   amLODipine (NORVASC) 10 MG tablet Take 1 tablet (10 mg total) by mouth daily.   aspirin EC 81 MG tablet Take 1 tablet (81 mg total) daily by mouth.   atorvastatin (LIPITOR) 80 MG tablet Take 1 tablet (80 mg total) by mouth once daily. (PLEASE SCHEDULE AN APPT FOR FUTURE REFILLS)   Blood Glucose Monitoring Suppl (ACCU-CHEK GUIDE) w/Device KIT 1 kit by Does not apply route in the morning, at noon, and at bedtime. Use to check blood sugar 3 times daily.   carvedilol (COREG) 25 MG tablet TAKE 1 TABLET (25 MG TOTAL) BY MOUTH 2 (TWO) TIMES DAILY WITH A MEAL.   Continuous Blood Gluc Receiver (FREESTYLE LIBRE 2 READER) DEVI Use as directed   Continuous Blood Gluc Sensor (FREESTYLE LIBRE SENSOR SYSTEM) MISC Change sensor every 2 weeks   diclofenac Sodium (VOLTAREN) 1 % GEL Apply 2 g topically 2 (two) times daily as needed.   ezetimibe (ZETIA) 10 MG tablet Take 1 tablet (10 mg total) by mouth daily.   glucose blood (ACCU-CHEK GUIDE) test strip Use to check blood sugar 3 times daily.   insulin glargine (LANTUS SOLOSTAR) 100 UNIT/ML Solostar Pen Inject 58 Units into the skin daily.Must have office visit for refills   insulin lispro (HUMALOG KWIKPEN) 100 UNIT/ML KwikPen Inject 24 Units into the skin in the morning and at bedtime.   Insulin Pen  Needle (BD PEN NEEDLE NANO U/F) 32G X 4 MM MISC Use as directed.   isosorbide mononitrate (IMDUR) 60 MG 24 hr tablet Take 1 tablet (60 mg total) by mouth daily.   Na Sulfate-K Sulfate-Mg Sulf 17.5-3.13-1.6 GM/177ML SOLN Suprep (no substitutions)-TAKE AS DIRECTED.   nitroGLYCERIN (NITROSTAT) 0.4 MG SL tablet Place 1 tab under tongue for chest pain.  May repeat after 5 minutes x 2.  DO NOT TAKE MORE THAN 3 TABS DURING AN EPISODE OF CHEST PAIN   potassium chloride (KLOR-CON) 10 MEQ tablet TAKE 2 TABLETS BY MOUTH DAILY   Semaglutide, 2 MG/DOSE, 8 MG/3ML SOPN Inject 2 mg as directed once a week.   No current facility-administered medications for this visit. (Other)    REVIEW OF SYSTEMS: ROS   Positive for: Endocrine, Cardiovascular, Eyes Negative for: Constitutional, Gastrointestinal, Neurological, Skin, Genitourinary, Musculoskeletal, HENT, Respiratory, Psychiatric, Allergic/Imm, Heme/Lymph Last edited by Etheleen Mayhew, COT on 07/27/2023  9:37 AM.      ALLERGIES Allergies  Allergen Reactions   Jardiance [Empagliflozin] Other (See Comments)    Frequent yeast infection   PAST MEDICAL HISTORY Past Medical History:  Diagnosis Date   Adrenal adenoma, left    Arthritis    Cancer (HCC)    CKD (chronic kidney disease), stage II    Coronary artery disease 07-28-2018 followed [facility])  currently due to no insurance   per cardiac cath 02-06-2015 (positive mild lateral ishcemia on stress test)--- dLAD 80%,  mLAD 40%,  mPDA 80% (small vessel),  ostial D1 70%,  CFx with lumial irregarlities-- medical management   Depression    Diabetic neuropathy (HCC)    Diabetic retinopathy (HCC)    NPDR OU   Headache    History of Bell's palsy 07/2011   per pt residual facial pain on left side occasionally   History of cancer of vagina 1999   per pt completed radiation and chemo   History of sepsis 12/30/2017   positive blood culter fro E.coli   Hyperlipidemia    Hypertension    Hypertensive retinopathy    OU   Hypokalemia    Insulin dependent type 2 diabetes mellitus, uncontrolled    followed by pcp---  A1c was 11.7 on 06-15-2018 in epic   Myocardial infarction [facility])    10 yrs ago?   Nocturia    Peripheral neuropathy    PMB (postmenopausal bleeding)    Wears dentures    upper   Wears glasses    Past Surgical History:  Procedure Laterality Date   BREAST BIOPSY  2012   benign   CARDIAC CATHETERIZATION N/A 02/06/2015   Procedure: Left Heart Cath and Coronary Angiography;  Surgeon: Laurey Morale, MD;  Location: [facility] INVASIVE CV LAB;  Service: Cardiovascular;  Laterality: N/A;   CATARACT EXTRACTION Bilateral OD: 03/07/19,  OS: 02/28/19   Dr. Nile Riggs   CATARACT EXTRACTION, BILATERAL     COLONOSCOPY     normal exam in [place], [place] abour 10-12 years ago   EYE SURGERY Bilateral OD: 03/07/19, OS: 02/28/19   Cat Sx - Dr. Nile Riggs   HYSTEROSCOPY WITH D & C N/A 08/01/2018   Procedure: DILATATION AND CURETTAGE /HYSTEROSCOPY;  Surgeon: Catalina Antigua, MD;  Location: [facility];  Service: Gynecology;  Laterality: N/A;   ROBOTIC ASSISTED TOTAL HYSTERECTOMY WITH BILATERAL SALPINGO OOPHERECTOMY N/A 11/28/2018   Procedure: XI ROBOTIC ASSISTED TOTAL HYSTERECTOMY WITH BILATERAL SALPINGO OOPHORECTOMY;  Surgeon: Adolphus Birchwood, MD;  Location: [facility];  Service: Gynecology;  Laterality: N/A;   SENTINEL NODE BIOPSY N/A 11/28/2018   Procedure: SENTINEL NODE BIOPSY;  Surgeon: Adolphus Birchwood, MD;  Location: [facility];  Service: Gynecology;  Laterality: N/A;   UMBILICAL HERNIA REPAIR  child   FAMILY HISTORY Family History  Problem Relation Age of Onset   Heart disease Mother    Hypertension Mother    Diabetes Mother    Cancer Father        hodgkins lymphoma   Heart disease Sister        heart attack   Diabetes Sister    Colon cancer Brother 54   Diabetes Maternal Aunt    Diabetes Maternal Uncle    Cancer Other        parent   Diabetes Other        parent   Heart disease Other        parent   Hyperlipidemia Other        parent   Hypertension Other        parent   Arthritis Other        parent   Breast cancer Neg Hx    Colon polyps Neg Hx    Esophageal cancer Neg Hx    Rectal cancer Neg Hx    Stomach cancer Neg Hx    SOCIAL HISTORY Social History   Tobacco Use   Smoking status: Never   Smokeless tobacco: Never  Vaping Use   Vaping status: Never Used  Substance Use Topics   Alcohol use: Not Currently   Drug use: Not Currently    Types: Marijuana    Comment: every 2 weeks per pt       OPHTHALMIC EXAM:  Base Eye Exam     Visual Acuity (Snellen - Linear)       Right Left   Dist [place] 20/20  20/100 -2   Dist ph [place]  NI         Tonometry (Tonopen, 9:42 AM)       Right Left   Pressure 14 17         Pupils       Pupils Dark Light Shape React APD   Right PERRL 3 2 Round Brisk None   Left PERRL 3 2 Round Brisk None         Visual Fields       Left Right    Full Full         Extraocular Movement       Right Left    Full, Ortho Full, Ortho         Neuro/Psych     Oriented x3: Yes   Mood/Affect: Normal         Dilation     Both eyes: 2.5% Phenylephrine @ 9:42 AM           Slit Lamp and Fundus Exam     Slit Lamp Exam       Right Left   Lids/Lashes Dermatochalasis - upper lid Dermatochalasis - upper lid   Conjunctiva/Sclera Mild Melanosis Mild Melanosis   Cornea 1+ Punctate epithelial erosions 2+ central punctate epithelial erosions, decreased TBUT, mild tear film debris, well healed temporal cataract wounds   Anterior Chamber Deep, 0.5+cell/pigment Deep, 0.5+cell/pigment   Iris Round and dilated, No NVI Round and dilated, No NVI   Lens PC IOL in good position with trace PCO PC IOL in good position with 1+PCO   Anterior Vitreous Vitreous syneresis, Posterior vitreous detachment Vitreous syneresis, Posterior  vitreous detachment, vitreous condensations         Fundus Exam       Right Left   Disc Pink and Sharp Compact, Pink and Sharp, temporal PPA   C/D Ratio 0.1 0.1   Macula Good foveal reflex, persistent cystic changes / edema inferior macula -- slightly improved, minimal Microaneurysms Blunted foveal reflex, central atrophy, +exudates/MA/IRH -- greatest temporal macula -- stably improved, trace cystic changes inferior macula -- slightly improved, no heme   Vessels attenuated, Tortuous attenuated, Tortuous   Periphery Attached, scattered drusen, rare scattered DBH, reticular degeneration Attached, scattered drusen, scattered DBH -- greatest nasal to disc, reticular degeneration           IMAGING AND PROCEDURES  Imaging and  Procedures for 07/27/2023  OCT, Retina - OU - Both Eyes       Right Eye Quality was good. Central Foveal Thickness: 258. Progression has improved. Findings include no SRF, abnormal foveal contour, intraretinal hyper-reflective material, intraretinal fluid, vitreomacular adhesion (Persistent IRF/cystic changes inferior macula and fovea - slightly improved).   Left Eye Quality was good. Central Foveal Thickness: 156. Progression has improved. Findings include no SRF, abnormal foveal contour, retinal drusen , subretinal hyper-reflective material, intraretinal hyper-reflective material, intraretinal fluid, outer retinal atrophy, vitreomacular adhesion (Persistent IRF/IRHM inferior and temporal mac and fovea -- slightly improved, +central ORA).   Notes *Images captured and stored on drive  Diagnosis / Impression:  Macular edema OU - likely combination of CME + DME OU OD: Persistent IRF/cystic changes inferior macula and fovea - slightly improved  OS: Persistent IRF/IRHM inferior and temporal mac and fovea -- slightly improved, +central ORA  Clinical management:  See below  Abbreviations: NFP - Normal foveal profile. CME - cystoid macular edema. PED - pigment epithelial detachment. IRF - intraretinal fluid. SRF - subretinal fluid. EZ - ellipsoid zone. ERM - epiretinal membrane. ORA - outer retinal atrophy. ORT - outer retinal tubulation. SRHM - subretinal hyper-reflective material      Intravitreal Injection, Pharmacologic Agent - OD - Right Eye       Time Out 07/27/2023. 10:52 AM. Confirmed correct patient, procedure, site, and patient consented.   Anesthesia Topical anesthesia was used. Anesthetic medications included Lidocaine 2%, Proparacaine 0.5%.   Procedure Preparation included 5% betadine to ocular surface, eyelid speculum. A supplied (32g) needle was used.   Injection: 1.25 mg Bevacizumab 1.25mg /0.32ml   Route: Intravitreal, Site: Right Eye   NDC: P3213405, Lot:  [id number], Expiration date: 09/03/2023   Post-op Post injection exam found visual acuity of at least counting fingers. The patient tolerated the procedure well. There were no complications. The patient received written and verbal post procedure care education.      Intravitreal Injection, Pharmacologic Agent - OS - Left Eye       Time Out 07/27/2023. 10:52 AM. Confirmed correct patient, procedure, site, and patient consented.   Anesthesia Topical anesthesia was used. Anesthetic medications included Lidocaine 2%, Proparacaine 0.5%.   Procedure Preparation included 5% betadine to ocular surface, eyelid speculum. A (32g) needle was used.   Injection: 6 mg faricimab-svoa 6 MG/0.05ML (Patient supplied)   Route: Intravitreal, Site: Left Eye   NDC: 95621-308-65, Lot: [id number], Expiration date: 01/27/2025, Waste: 0 mL   Post-op Post injection exam found visual acuity of at least counting fingers. The patient tolerated the procedure well. There were no complications. The patient received written and verbal post procedure care education. Post injection medications were not given.   Notes **SAMPLE MEDICATION ADMINISTERED**  ASSESSMENT/PLAN:    ICD-10-CM   1. 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 Current use of insulin (HCC)  Z79.4     3. Long-term (current) use of injectable non-insulin antidiabetic drugs  Z79.85     4. CME (cystoid macular edema), bilateral  H35.353     5. Essential hypertension  I10     6. Hypertensive retinopathy of both eyes  H35.033     7. Pseudophakia of both eyes  Z96.1     8. Bilateral  ocular hypertension  H40.053     9. Dry eyes, bilateral  H04.123      1-4. Moderate Non-proliferative diabetic retinopathy, OU OD with combination DME/CME  - last A1c 8.7 (11.12.24), 8.2 (03.08.24); 9.6 (08.07.23), 10.1 (1.31.23)  - pt now on Ozempic as of Mar 2024 - h/o delayed to follow up from September 2021 to April 2022--7 mos instead of 4 wks**             - exam shows scattered IRH, edema and tortuous blood vessels OU - FA (10.23.20) shows leaking MA OU and paracentral petaloid hyperfluorecence OD --> CME/[place] - s/p IVA OD #1 (11.06.20), #2 (12.18.20), #3 (02.24.21), #4 (03.24.21), #5 (04.21.21), #6 (5.26.21), #7 (07.26.21), #8 (08.25.21), #9 (09.23.21), #10 (04.26.22), #11 (01.08.25) - s/p IVA OS #1 (10.23.20), #2 (11.18.20), #3 (12.18.20), #4 (02.24.21), #5 (03.24.21) -- IVA resistance  ============================== - s/p IVE OD #1 (05.25.22), #2 (07.01.22), #3 (07.29.22), #4 (08.29.22 - JDM), #5 (09.26.22), #6 (10.24.22), #7 (11.21.22), #8 (12.19.22), #9 (01.23.23), #10 (02.20.23), #11 (03.22.23), #12 (04.19.23), #13 (05.17.23), #14 (06.14.23), #15 (08.04.23), #16 (09.01.23), #17 (10.10.23), #18 (11.14.23), #19 (12.19.23), #20 (02.13.24), #21 (03.20.24), #22 (03.20.24), #23 (03.20.24), #24 (04.24.24) #25(06.28.24), #26 (08.16.24), #27 (10.02.24), #28 (11.20.24) - s/p IVE OS #1 (04.21.21), #2 (5.26.21), #3 (07.26.21), #4 (08.25.21), #5 (09.23.21), #6 (04.26.22), #7 (05.25.22), #8 (07.01.22), #9 (07.29.22), #10 (08.29.22 - JDM), #11 (09.26.22), #12 (10.24.22) -- IVE resistance ============================= - s/p IVV OS #1 (11.21.22, sample), #2 (12.19.22), #3 (01.23.23), #4 (02.20.23), #5 (03.22.23), #6 (04.19.23), #7 (05.17.23), #8 (06.14.23, sample), #9 (08.04.23), #10 (9.01.23), #11 (10.10.23), #12 (11.14.23), #13 (12.19.23), #14 (02.13.24), #15 (03.20.24) #16(3.20.24), #17 (03.20.24), #18 (04.24.24) #19(06.28.24), #20 (08.16.24), #21 (10.02.24), #22 (11.20.24), #23 (01.08.25 --  SAMPLE) - BCVA OD 20/20 -- stable; OS 20/100 - improved from 20/150 - OCT today: OD: Persistent IRF/cystic changes inferior macula and fovea - slightly improved; OS: Persistent IRF/IRHM inferior and temporal mac and fovea -- slightly improved, +central ORA at 7 weeks - recommend IVA OD #12 and IVV OS #24 [sample] today, 02.26.25 with follow up in 7 weeks again  - [employer] funding unavailable at this time -- pt wishes to proceed w/ injections             - RBA of procedure discussed, questions answered - Vabysmo OS consent form signed and scanned on 11.20.24 - Avastin OD informed consent signed and scanned on 01.08.25 - see procedure note             - continue Ketorolac QID OU             -  f/u in 7 weeks, DFE, OCT, possible injection OU   5,6. Hypertensive retinopathy OU             - discussed importance of tight BP control             - continue to monitor   7. Pseudophakia OU            - s/p CE/IOL OU Nile Riggs), OD: 03/07/2019, OS: 02/28/2019             - IOLs in good position             - CME OU as above             - continue to monitor   8. Ocular Hypertension OU             - IOP: 14,17 - h/o steroid response             - cont Cosopt BID OU and Brimonidine BID OU  9. Dry eyes OU - recommend artificial tears and lubricating ointment as needed   Ophthalmic Meds Ordered this visit:  Meds ordered this encounter  Medications   faricimab-svoa (VABYSMO) 6mg /0.83mL intravitreal injection   Bevacizumab (AVASTIN) SOLN 1.25 mg     Return in about 7 weeks (around 09/14/2023) for f/u NPDR OU, DFE, OCT, Possible Injxn.  There are no Patient Instructions on file for this visit.  This document serves as a record of services personally performed by Karie Chimera, MD, PhD. It was created on their behalf by Glee Arvin. Manson Passey, OA an ophthalmic technician. The creation of this record is the provider's dictation and/or activities during the visit.    Electronically signed by: Glee Arvin. Manson Passey, OA 07/29/23 12:00 AM  Karie Chimera, M.D., Ph.D. Diseases & Surgery of the Retina and Vitreous [facility]  I have reviewed the above documentation for accuracy and completeness, and I agree with the above. Karie Chimera, M.D., Ph.D. 07/29/23 12:01 AM   Abbreviations: M myopia (nearsighted); A astigmatism; H hyperopia (farsighted); P presbyopia; Mrx spectacle prescription;  CTL contact lenses; OD right eye; OS left eye; OU both eyes  XT exotropia; ET esotropia; PEK punctate epithelial keratitis; PEE punctate epithelial erosions; DES dry eye syndrome; MGD meibomian gland dysfunction; ATs artificial tears; PFAT's preservative free artificial tears; NSC nuclear sclerotic cataract; PSC posterior subcapsular cataract; ERM epi-retinal membrane; PVD posterior vitreous detachment; RD retinal detachment; DM diabetes mellitus; DR diabetic retinopathy; NPDR non-proliferative diabetic retinopathy; PDR proliferative diabetic retinopathy; CSME clinically significant macular edema; DME diabetic macular edema; dbh dot blot hemorrhages; CWS cotton wool spot; POAG primary open angle glaucoma; C/D cup-to-disc ratio; HVF humphrey visual field; GVF goldmann visual field; OCT optical coherence tomography; IOP intraocular pressure; BRVO Branch retinal vein occlusion; CRVO central retinal vein occlusion; CRAO central retinal artery occlusion; BRAO branch retinal artery occlusion; RT retinal tear; SB scleral buckle; PPV pars plana vitrectomy; VH Vitreous hemorrhage; PRP panretinal laser photocoagulation; IVK intravitreal kenalog; VMT vitreomacular traction; MH Macular hole;  NVD neovascularization of the disc; NVE neovascularization elsewhere; AREDS age related eye disease study; ARMD age related macular degeneration; POAG primary open angle glaucoma; EBMD epithelial/anterior basement membrane dystrophy; ACIOL anterior chamber intraocular lens; IOL intraocular lens; PCIOL posterior chamber  intraocular lens; Phaco/IOL phacoemulsification with intraocular lens placement; PRK photorefractive keratectomy; LASIK laser assisted in situ keratomileusis; HTN hypertension; DM diabetes mellitus; COPD chronic obstructive pulmonary disease

## 2023-07-27 ENCOUNTER — Encounter (INDEPENDENT_AMBULATORY_CARE_PROVIDER_SITE_OTHER): Payer: Self-pay | Admitting: Ophthalmology

## 2023-07-27 ENCOUNTER — Ambulatory Visit (INDEPENDENT_AMBULATORY_CARE_PROVIDER_SITE_OTHER): Payer: 59 | Admitting: Ophthalmology

## 2023-07-27 DIAGNOSIS — Z961 Presence of intraocular lens: Secondary | ICD-10-CM

## 2023-07-27 DIAGNOSIS — I10 Essential (primary) hypertension: Secondary | ICD-10-CM

## 2023-07-27 DIAGNOSIS — H40053 Ocular hypertension, bilateral: Secondary | ICD-10-CM

## 2023-07-27 DIAGNOSIS — H35353 Cystoid macular degeneration, bilateral: Secondary | ICD-10-CM | POA: Diagnosis not present

## 2023-07-27 DIAGNOSIS — H35033 Hypertensive retinopathy, bilateral: Secondary | ICD-10-CM

## 2023-07-27 DIAGNOSIS — E113313 Type 2 diabetes mellitus with moderate nonproliferative diabetic retinopathy with macular edema, bilateral: Secondary | ICD-10-CM | POA: Diagnosis not present

## 2023-07-27 DIAGNOSIS — Z794 Long term (current) use of insulin: Secondary | ICD-10-CM | POA: Diagnosis not present

## 2023-07-27 DIAGNOSIS — Z7985 Long-term (current) use of injectable non-insulin antidiabetic drugs: Secondary | ICD-10-CM | POA: Diagnosis not present

## 2023-07-27 DIAGNOSIS — H04123 Dry eye syndrome of bilateral lacrimal glands: Secondary | ICD-10-CM | POA: Diagnosis not present

## 2023-07-27 MED ORDER — FARICIMAB-SVOA 6 MG/0.05ML IZ SOLN
6.0000 mg | INTRAVITREAL | Status: AC | PRN
  Administered 2023-07-27: 6 mg via INTRAVITREAL

## 2023-07-27 MED ORDER — BEVACIZUMAB CHEMO INJECTION 1.25MG/0.05ML SYRINGE FOR KALEIDOSCOPE
1.2500 mg | INTRAVITREAL | Status: AC | PRN
Start: 2023-07-27 — End: 2023-07-27
  Administered 2023-07-27: 1.25 mg via INTRAVITREAL

## 2023-08-16 ENCOUNTER — Ambulatory Visit: Payer: Self-pay | Admitting: Internal Medicine

## 2023-08-19 ENCOUNTER — Other Ambulatory Visit: Payer: Self-pay

## 2023-08-24 ENCOUNTER — Encounter: Payer: Self-pay | Admitting: Pharmacist

## 2023-08-24 ENCOUNTER — Other Ambulatory Visit (HOSPITAL_BASED_OUTPATIENT_CLINIC_OR_DEPARTMENT_OTHER): Admitting: Pharmacist

## 2023-08-24 ENCOUNTER — Other Ambulatory Visit: Payer: Self-pay

## 2023-08-24 DIAGNOSIS — Z794 Long term (current) use of insulin: Secondary | ICD-10-CM

## 2023-08-24 DIAGNOSIS — E1169 Type 2 diabetes mellitus with other specified complication: Secondary | ICD-10-CM

## 2023-08-24 DIAGNOSIS — Z7985 Long-term (current) use of injectable non-insulin antidiabetic drugs: Secondary | ICD-10-CM

## 2023-08-24 MED ORDER — LANTUS SOLOSTAR 100 UNIT/ML ~~LOC~~ SOPN
64.0000 [IU] | PEN_INJECTOR | Freq: Every day | SUBCUTANEOUS | 1 refills | Status: DC
  Filled 2023-08-24 – 2023-09-05 (×3): qty 60, 93d supply, fill #0
  Filled 2023-10-25: qty 60, 93d supply, fill #1
  Filled 2023-12-14: qty 60, 90d supply, fill #1
  Filled 2023-12-14: qty 60, 93d supply, fill #1
  Filled 2023-12-21: qty 60, 93d supply, fill #0

## 2023-08-24 MED ORDER — INSULIN LISPRO (1 UNIT DIAL) 100 UNIT/ML (KWIKPEN)
28.0000 [IU] | PEN_INJECTOR | Freq: Two times a day (BID) | SUBCUTANEOUS | 1 refills | Status: DC
  Filled 2023-08-24: qty 60, 107d supply, fill #0
  Filled 2023-09-03: qty 45, 80d supply, fill #0
  Filled 2023-09-15: qty 60, 88d supply, fill #0
  Filled 2023-10-25 – 2023-12-14 (×3): qty 60, 88d supply, fill #1
  Filled 2023-12-21: qty 36, 64d supply, fill #0

## 2023-08-24 NOTE — Progress Notes (Signed)
 Pharmacy TNM Diabetes Measure Review  S:  Patient was identified in a report as being at risk for failing the True [place] Metric of A1c control (<8%) in [place] and African American patients. Last A1c was 8.7. Last PCP visit was 04/12/23. She was scheduled to see Dr. Laural Benes 08/16/2023 but cancelled d/t lack of transportation.   Call placed to patient to discuss diabetes control and medication management. I spoke with her last month and increased Ozempic dose. We also decreased Humalog dose. She is in good spirits today. Tolerating Ozempic well at the 2mg  dose from a GI standpoint. Denies any NV, or abdominal pain. She does question if she should continue use with her hx of retinopathy and vision changes. She continues to see Dr. Vanessa Barbara. She thinks maybe that her vision has been slightly worse since she has been on the Ozempic.  Current diabetes medications include: Lantus 58 units once daily, Humalog 24 units BID before meals, Ozempic 2 mg weekly Patient reports adherence to taking all medications as prescribed.   Insurance coverage: [employer]  Patient denies hypoglycemic events.  Reported home fasting blood sugars: 140s - 160s. Rare outlier in the 200s or 300s if she forgets her mealtime insulin.   O:   Lab Results  Component Value Date   HGBA1C 8.7 (A) 04/12/2023   There were no vitals filed for this visit.  Lipid Panel     Component Value Date/Time   CHOL 126 08/06/2022 1125   TRIG 110 08/06/2022 1125   HDL 44 08/06/2022 1125   CHOLHDL 2.9 08/06/2022 1125   CHOLHDL 5 07/15/2015 1513   VLDL 44.0 (H) 07/15/2015 1513   LDLCALC 62 08/06/2022 1125   LDLDIRECT 93.0 07/15/2015 1513    Clinical Atherosclerotic Cardiovascular Disease (ASCVD): No  The ASCVD Risk score (Arnett DK, et al., 2019) failed to calculate for the following reasons:   The valid total cholesterol range is 130 to 320 mg/dL   Patient is participating in a Managed Medicaid Plan: No   A/P: Diabetes  longstanding currently uncontrolled based on A1c. Home sugars seem to be improving with Ozempic and are close to goal. Unfortunately, she thinks her vision is worse since being on the Ozempic, even when she first started it. She tells me today that she hasn't addressed this before but was reading up on the listed side effects since our last visit and worries that Ozempic may be worsening her vision. We will have her stop the Ozempic. She sees Dr. Vanessa Barbara in about 3 weeks. She will report to Dr. Vanessa Barbara any improvements. To compensate for this, we will need to increase insulin doses. -Increase Lantus to 64 units daily.  -Increase Humalog to 28 units BID before meals. Pt instructed to further increase to 34u BID before meals after 1 week if post-prandial blood sugars are >200. -Hold Ozempic for now until seen by Dr. Vanessa Barbara in 3 weeks.  -Patient educated on purpose, proper use, and potential adverse effects of Ozempic, insulin.  -Extensively discussed pathophysiology of diabetes, recommended lifestyle interventions, dietary effects on blood sugar control.  -Counseled on s/sx of and management of hypoglycemia.  -Next A1c anticipated 07/2023.   Follow-up:  PCP: 1 month  Butch Penny, PharmD, [place], CPP Clinical Pharmacist [facility] [phone]

## 2023-09-02 ENCOUNTER — Other Ambulatory Visit: Payer: Self-pay

## 2023-09-05 ENCOUNTER — Other Ambulatory Visit: Payer: Self-pay

## 2023-09-05 ENCOUNTER — Other Ambulatory Visit (HOSPITAL_COMMUNITY): Payer: Self-pay

## 2023-09-07 ENCOUNTER — Other Ambulatory Visit: Payer: Self-pay

## 2023-09-07 ENCOUNTER — Other Ambulatory Visit (HOSPITAL_COMMUNITY): Payer: Self-pay

## 2023-09-13 NOTE — Progress Notes (Signed)
 [facility] - Clinic Note  09/14/2023     CHIEF COMPLAINT Patient presents for Retina Follow Up   HISTORY OF PRESENT ILLNESS: Kimberly Frazier is a 63 y.o. female who presents to the clinic today for:   HPI     Retina Follow Up   Patient presents with  Diabetic Retinopathy.  In both eyes.  This started 7 weeks ago.  Duration of 7 weeks.  Since onset it is stable.  I, the attending physician,  performed the HPI with the patient and updated documentation appropriately.        Comments   Pt presents for 7 week retina follow up NPDR IVA OD and IVV OS sample. Pt states no changes in vision. Pt does have occasional flashes and floaters. Pt states the only discomfort she has is if she goes without her refresh drops for too long. Pt states she is using Ats 4+ times per day. Pt is consistent with using Ketorolac qid OU, Cosopt BID OU, and Brimonidine BID OU. Pt's BS was 119 yesterday morning. Last A1c was 8 about 1 year ago, next test coming up soon.      Last edited by Rennis Chris, MD on 09/14/2023 12:13 PM.      Referring physician: Marcine Matar, MD [address] [place],  [place] [address]  HISTORICAL INFORMATION:   Selected notes from the MEDICAL RECORD NUMBER Referred by Dr. Jethro Bolus for concern of CME    CURRENT MEDICATIONS: Current Outpatient Medications (Ophthalmic Drugs)  Medication Sig   brimonidine (ALPHAGAN) 0.15 % ophthalmic solution Place 1 drop into both eyes 2 (two) times daily.   brimonidine (ALPHAGAN) 0.2 % ophthalmic solution Place 1 drop into both eyes 2 (two) times daily.   dorzolamide-timolol (COSOPT) 2-0.5 % ophthalmic solution Place 1 drop into both eyes 2 (two) times daily.   ketorolac (ACULAR) 0.5 % ophthalmic solution Place 1 drop into both eyes 4 (four) times daily.   prednisoLONE acetate (PRED FORTE) 1 % ophthalmic suspension Place 1 drop into both eyes daily.   No current facility-administered medications for  this visit. (Ophthalmic Drugs)   Current Outpatient Medications (Other)  Medication Sig   Accu-Chek Softclix Lancets lancets Use to check blood sugar 3 times daily.   amLODipine (NORVASC) 10 MG tablet Take 1 tablet (10 mg total) by mouth daily.   aspirin EC 81 MG tablet Take 1 tablet (81 mg total) daily by mouth.   atorvastatin (LIPITOR) 80 MG tablet Take 1 tablet (80 mg total) by mouth once daily. (PLEASE SCHEDULE AN APPT FOR FUTURE REFILLS)   Blood Glucose Monitoring Suppl (ACCU-CHEK GUIDE) w/Device KIT 1 kit by Does not apply route in the morning, at noon, and at bedtime. Use to check blood sugar 3 times daily.   carvedilol (COREG) 25 MG tablet TAKE 1 TABLET (25 MG TOTAL) BY MOUTH 2 (TWO) TIMES DAILY WITH A MEAL.   Continuous Blood Gluc Receiver (FREESTYLE LIBRE 2 READER) DEVI Use as directed   Continuous Blood Gluc Sensor (FREESTYLE LIBRE SENSOR SYSTEM) MISC Change sensor every 2 weeks   diclofenac Sodium (VOLTAREN) 1 % GEL Apply 2 g topically 2 (two) times daily as needed.   ezetimibe (ZETIA) 10 MG tablet Take 1 tablet (10 mg total) by mouth daily.   glucose blood (ACCU-CHEK GUIDE) test strip Use to check blood sugar 3 times daily.   insulin glargine (LANTUS SOLOSTAR) 100 UNIT/ML Solostar Pen Inject 64 Units into the skin daily.  insulin lispro (HUMALOG KWIKPEN) 100 UNIT/ML KwikPen Inject 28 Units into the skin in the morning and at bedtime. Increase to 34 units into the skin twice daily after 1 week if blood sugars remain above 200.   Insulin Pen Needle (BD PEN NEEDLE NANO U/F) 32G X 4 MM MISC Use as directed.   isosorbide mononitrate (IMDUR) 60 MG 24 hr tablet Take 1 tablet (60 mg total) by mouth daily.   Na Sulfate-K Sulfate-Mg Sulf 17.5-3.13-1.6 GM/177ML SOLN Suprep (no substitutions)-TAKE AS DIRECTED.   nitroGLYCERIN (NITROSTAT) 0.4 MG SL tablet Place 1 tab under tongue for chest pain.  May repeat after 5 minutes x 2.  DO NOT TAKE MORE THAN 3 TABS DURING AN EPISODE OF CHEST PAIN    potassium chloride (KLOR-CON) 10 MEQ tablet TAKE 2 TABLETS BY MOUTH DAILY   No current facility-administered medications for this visit. (Other)   REVIEW OF SYSTEMS: ROS   Positive for: Endocrine, Cardiovascular, Eyes Negative for: Constitutional, Gastrointestinal, Neurological, Skin, Genitourinary, Musculoskeletal, HENT, Respiratory, Psychiatric, Allergic/Imm, Heme/Lymph Last edited by Elicia Lamp, COT on 09/14/2023  9:37 AM.     ALLERGIES Allergies  Allergen Reactions   Jardiance [Empagliflozin] Other (See Comments)    Frequent yeast infection   PAST MEDICAL HISTORY Past Medical History:  Diagnosis Date   Adrenal adenoma, left    Arthritis    Cancer (HCC)    CKD (chronic kidney disease), stage II    Coronary artery disease 07-28-2018 followed [facility]  currently due to no insurance   per cardiac cath 02-06-2015 (positive mild lateral ishcemia on stress test)--- dLAD 80%,  mLAD 40%,  mPDA 80% (small vessel),  ostial D1 70%,  CFx with lumial irregarlities-- medical management   Depression    Diabetic neuropathy (HCC)    Diabetic retinopathy (HCC)    NPDR OU   Headache    History of Bell's palsy 07/2011   per pt residual facial pain on left side occasionally   History of cancer of vagina 1999   per pt completed radiation and chemo   History of sepsis 12/30/2017   positive blood culter fro E.coli   Hyperlipidemia    Hypertension    Hypertensive retinopathy    OU   Hypokalemia    Insulin dependent type 2 diabetes mellitus, uncontrolled    followed by pcp---  A1c was 11.7 on 06-15-2018 in epic   Myocardial infarction [facility])    10 yrs ago?   Nocturia    Peripheral neuropathy    PMB (postmenopausal bleeding)    Wears dentures    upper   Wears glasses    Past Surgical History:  Procedure Laterality Date   BREAST BIOPSY  2012   benign   CARDIAC CATHETERIZATION N/A 02/06/2015   Procedure: Left Heart Cath and Coronary Angiography;  Surgeon:  Laurey Morale, MD;  Location: [facility] INVASIVE CV LAB;  Service: Cardiovascular;  Laterality: N/A;   CATARACT EXTRACTION Bilateral OD: 03/07/19, OS: 02/28/19   Dr. Nile Riggs   CATARACT EXTRACTION, BILATERAL     COLONOSCOPY     normal exam in [place], [place] abour 10-12 years ago   EYE SURGERY Bilateral OD: 03/07/19, OS: 02/28/19   Cat Sx - Dr. Nile Riggs   HYSTEROSCOPY WITH D & C N/A 08/01/2018   Procedure: DILATATION AND CURETTAGE /HYSTEROSCOPY;  Surgeon: Catalina Antigua, MD;  Location: [facility];  Service: Gynecology;  Laterality: N/A;   ROBOTIC ASSISTED TOTAL HYSTERECTOMY WITH BILATERAL SALPINGO OOPHERECTOMY N/A 11/28/2018  Procedure: XI ROBOTIC ASSISTED TOTAL HYSTERECTOMY WITH BILATERAL SALPINGO OOPHORECTOMY;  Surgeon: Alphonso Aschoff, MD;  Location: [facility];  Service: Gynecology;  Laterality: N/A;   SENTINEL NODE BIOPSY N/A 11/28/2018   Procedure: SENTINEL NODE BIOPSY;  Surgeon: Alphonso Aschoff, MD;  Location: [facility];  Service: Gynecology;  Laterality: N/A;   UMBILICAL HERNIA REPAIR  child   FAMILY HISTORY Family History  Problem Relation Age of Onset   Heart disease Mother    Hypertension Mother    Diabetes Mother    Cancer Father        hodgkins lymphoma   Heart disease Sister        heart attack   Diabetes Sister    Colon cancer Brother 74   Diabetes Maternal Aunt    Diabetes Maternal Uncle    Cancer Other        parent   Diabetes Other        parent   Heart disease Other        parent   Hyperlipidemia Other        parent   Hypertension Other        parent   Arthritis Other        parent   Breast cancer Neg Hx    Colon polyps Neg Hx    Esophageal cancer Neg Hx    Rectal cancer Neg Hx    Stomach cancer Neg Hx    SOCIAL HISTORY Social History   Tobacco Use   Smoking status: Never   Smokeless tobacco: Never  Vaping Use   Vaping status: Never Used  Substance Use Topics   Alcohol use: Not Currently   Drug use: Not Currently    Types: Marijuana    Comment:  every 2 weeks per pt       OPHTHALMIC EXAM:  Base Eye Exam     Visual Acuity (Snellen - Linear)       Right Left   Dist [place] 20/20 -2 20/150 -2   Dist ph cc  20/150 +2         Tonometry (Tonopen, 9:44 AM)       Right Left   Pressure 17 18         Pupils       Dark Light Shape React APD   Right 3 2 Round Brisk None   Left 3 2 Round Brisk None         Visual Fields       Left Right    Full Full         Extraocular Movement       Right Left    Full, Ortho Full, Ortho         Neuro/Psych     Oriented x3: Yes   Mood/Affect: Normal         Dilation     Both eyes: 1.0% Mydriacyl, 2.5% Phenylephrine @ 9:44 AM           Slit Lamp and Fundus Exam     Slit Lamp Exam       Right Left   Lids/Lashes Dermatochalasis - upper lid Dermatochalasis - upper lid   Conjunctiva/Sclera Mild Melanosis Mild Melanosis   Cornea 1+ Punctate epithelial erosions 2+ central punctate epithelial erosions, decreased TBUT, mild tear film debris, well healed temporal cataract wounds   Anterior Chamber Deep, 0.5+cell/pigment Deep, 0.5+cell/pigment   Iris Round and dilated, No NVI Round and dilated, No NVI   Lens PC IOL in  good position with trace PCO PC IOL in good position with 1+PCO   Anterior Vitreous Vitreous syneresis, Posterior vitreous detachment Vitreous syneresis, Posterior vitreous detachment, vitreous condensations         Fundus Exam       Right Left   Disc Pink and Sharp Compact, Pink and Sharp, temporal PPA   C/D Ratio 0.1 0.1   Macula Good foveal reflex, persistent cystic changes / edema inferior macula -- slightly improved, minimal Microaneurysms Blunted foveal reflex, central atrophy, +exudates/MA/IRH -- greatest temporal macula -- stably improved, trace cystic changes inferior macula -- slightly improved, no heme   Vessels attenuated, Tortuous attenuated, Tortuous   Periphery Attached, scattered drusen, rare scattered DBH, reticular degeneration  Attached, scattered drusen, scattered DBH -- greatest nasal to disc, reticular degeneration           IMAGING AND PROCEDURES  Imaging and Procedures for 09/14/2023  OCT, Retina - OU - Both Eyes       Right Eye Quality was good. Central Foveal Thickness: 261. Progression has been stable. Findings include no SRF, abnormal foveal contour, intraretinal hyper-reflective material, intraretinal fluid, vitreomacular adhesion (Persistent IRF/cystic changes inferior macula and fovea ).   Left Eye Quality was good. Central Foveal Thickness: 156. Progression has improved. Findings include no SRF, abnormal foveal contour, retinal drusen , subretinal hyper-reflective material, intraretinal hyper-reflective material, intraretinal fluid, outer retinal atrophy, vitreomacular adhesion (Persistent IRF/IRHM inferior and temporal mac and fovea -- slightly improved, +central ORA).   Notes *Images captured and stored on drive  Diagnosis / Impression:  Macular edema OU - likely combination of CME + DME OU OD: Persistent IRF/cystic changes inferior macula and fovea  OS: Persistent IRF/IRHM inferior and temporal mac and fovea -- slightly improved, +central ORA  Clinical management:  See below  Abbreviations: NFP - Normal foveal profile. CME - cystoid macular edema. PED - pigment epithelial detachment. IRF - intraretinal fluid. SRF - subretinal fluid. EZ - ellipsoid zone. ERM - epiretinal membrane. ORA - outer retinal atrophy. ORT - outer retinal tubulation. SRHM - subretinal hyper-reflective material      Intravitreal Injection, Pharmacologic Agent - OD - Right Eye       Time Out 09/14/2023. 10:56 AM. Confirmed correct patient, procedure, site, and patient consented.   Anesthesia Topical anesthesia was used. Anesthetic medications included Lidocaine 2%, Proparacaine 0.5%.   Procedure Preparation included 5% betadine to ocular surface, eyelid speculum. A supplied (32g) needle was used.    Injection: 1.25 mg Bevacizumab 1.25mg /0.56ml   Route: Intravitreal, Site: Right Eye   NDC: P3213405, Lot: [id number], Expiration date: 10/15/2023   Post-op Post injection exam found visual acuity of at least counting fingers. The patient tolerated the procedure well. There were no complications. The patient received written and verbal post procedure care education.      Intravitreal Injection, Pharmacologic Agent - OS - Left Eye       Time Out 09/14/2023. 10:57 AM. Confirmed correct patient, procedure, site, and patient consented.   Anesthesia Topical anesthesia was used. Anesthetic medications included Lidocaine 2%, Proparacaine 0.5%.   Procedure Preparation included 5% betadine to ocular surface, eyelid speculum. A (32g) needle was used.   Injection: 6 mg faricimab-svoa 6 MG/0.05ML (Patient supplied)   Route: Intravitreal, Site: Left Eye   NDC: 45409-811-91, Lot: [id number], Expiration date: 01/27/2025, Waste: 0 mL   Post-op Post injection exam found visual acuity of at least counting fingers. The patient tolerated the procedure well. There were no complications. The patient received written  and verbal post procedure care education.   Notes **SAMPLE MEDICATION ADMINISTERED**           ASSESSMENT/PLAN:    ICD-10-CM   1. Moderate nonproliferative diabetic retinopathy of both eyes with macular edema associated with type 2 diabetes mellitus (HCC)  E11.3313 OCT, Retina - OU - Both Eyes    Intravitreal Injection, Pharmacologic Agent - OD - Right Eye    Intravitreal Injection, Pharmacologic Agent - OS - Left Eye    faricimab-svoa (VABYSMO) 6mg /0.15mL intravitreal injection    Bevacizumab (AVASTIN) SOLN 1.25 mg    2. Current use of insulin (HCC)  Z79.4     3. Long-term (current) use of injectable non-insulin antidiabetic drugs  Z79.85     4. CME (cystoid macular edema), bilateral  H35.353     5. Essential hypertension  I10     6. Hypertensive retinopathy of both eyes   H35.033     7. Pseudophakia of both eyes  Z96.1     8. 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 Dry eyes, bilateral  H04.123      1-4. Moderate Non-proliferative diabetic retinopathy, OU OD with combination DME/CME  - last A1c 8.7 (11.12.24), 8.2 (03.08.24); 9.6 (08.07.23), 10.1 (1.31.23)  - pt has stopped Ozempic recently due to change in vision - h/o delayed to follow up from September 2021 to April 2022--7 mos instead of 4 wks**             - exam shows scattered IRH, edema and tortuous blood vessels OU - FA (10.23.20) shows leaking MA OU and paracentral petaloid hyperfluorecence OD --> CME/[place] - s/p IVA OD #1 (11.06.20), #2 (12.18.20), #3 (02.24.21), #4 (03.24.21), #5 (04.21.21), #6 (5.26.21), #7 (07.26.21), #8 (08.25.21), #9 (09.23.21), #10 (04.26.22), #11 (01.08.25), #12 (02.26.25) - s/p IVA OS #1 (10.23.20), #2 (11.18.20), #3 (12.18.20), #4 (02.24.21), #5 (03.24.21) -- IVA resistance  ============================= - s/p IVE OD #1 (05.25.22), #2 (07.01.22), #3 (07.29.22), #4 (08.29.22 - JDM), #5 (09.26.22), #6 (10.24.22), #7 (11.21.22), #8 (12.19.22), #9 (01.23.23), #10 (02.20.23), #11 (03.22.23), #12 (04.19.23), #13 (05.17.23), #14 (06.14.23), #15 (08.04.23), #16 (09.01.23), #17 (10.10.23), #18 (11.14.23), #19 (12.19.23), #20 (02.13.24), #21 (03.20.24), #22 (03.20.24), #23 (03.20.24), #24 (04.24.24) #25(06.28.24), #26 (08.16.24), #27 (10.02.24), #28 (11.20.24) - s/p IVE OS #1 (04.21.21), #2 (5.26.21), #3 (07.26.21), #4 (08.25.21), #5 (09.23.21), #6 (04.26.22), #7 (05.25.22), #8 (07.01.22), #9 (07.29.22), #10 (08.29.22 - JDM), #11 (09.26.22), #12 (10.24.22) -- IVE resistance ============================= - s/p IVV OS #1 (11.21.22, sample), #2 (12.19.22), #3 (01.23.23), #4 (02.20.23), #5 (03.22.23), #6 (04.19.23), #7 (05.17.23), #8 (06.14.23, sample), #9 (08.04.23), #10 (9.01.23), #11 (10.10.23), #12 (11.14.23), #13 (12.19.23), #14 (02.13.24), #15 (03.20.24) #16(3.20.24), #17  (03.20.24), #18 (04.24.24) #19(06.28.24), #20 (08.16.24), #21 (10.02.24), #22 (11.20.24), #23 (01.08.25 -- SAMPLE), #24 (02.26.25 -- SAMPLE) - BCVA OD 20/20 -- stable; OS 20/150 from 20/100 - OCT today: OD: Persistent IRF/cystic changes inferior macula and fovea; OS: Persistent IRF/IRHM inferior and temporal mac and fovea -- slightly improved, +central ORA at 7 weeks - recommend IVA OD #13 and IVV OS #25 [sample] today, 04.16.25 with follow up in 7 weeks again - [employer] funding unavailable at this time -- pt wishes to proceed w/ injections             - RBA of procedure discussed, questions answered - Vabysmo OS consent form signed and scanned on 11.20.24 - Avastin OD informed consent signed and scanned on 01.08.25 - see procedure note             -  continue Ketorolac QID OU             - f/u in 7 weeks, DFE, OCT, possible injection OU   5,6. Hypertensive retinopathy OU             - discussed importance of tight BP control             - continue to monitor   7. Pseudophakia OU            - s/p CE/IOL OU Nile Riggs), OD: 03/07/2019, OS: 02/28/2019             - IOLs in good position             - CME OU as above             - continue to monitor   8. Ocular Hypertension OU             - IOP: 17, 18 - h/o steroid response             - cont Cosopt BID OU and Brimonidine BID OU  9. Dry eyes OU - recommend artificial tears and lubricating ointment as needed   Ophthalmic Meds Ordered this visit:  Meds ordered this encounter  Medications   faricimab-svoa (VABYSMO) 6mg /0.84mL intravitreal injection   Bevacizumab (AVASTIN) SOLN 1.25 mg     Return in about 7 weeks (around 11/02/2023) for f/u NPDR OU, DFE, OCT, Possible, IVA, OD, IVV, OS.  There are no Patient Instructions on file for this visit.  This document serves as a record of services personally performed by Karie Chimera, MD, PhD. It was created on their behalf by Glee Arvin. Manson Passey, OA an ophthalmic technician. The creation of  this record is the provider's dictation and/or activities during the visit.    Electronically signed by: Glee Arvin. Manson Passey, OA 09/14/23 12:15 PM  This document serves as a record of services personally performed by Karie Chimera, MD, PhD. It was created on their behalf by Charlette Caffey, COT an ophthalmic technician. The creation of this record is the provider's dictation and/or activities during the visit.    Electronically signed by:  Charlette Caffey, COT  09/14/23 12:15 PM   Karie Chimera, M.D., Ph.D. Diseases & Surgery of the Retina and Vitreous [facility]  I have reviewed the above documentation for accuracy and completeness, and I agree with the above. Karie Chimera, M.D., Ph.D. 09/14/23 12:16 PM   Abbreviations: M myopia (nearsighted); A astigmatism; H hyperopia (farsighted); P presbyopia; Mrx spectacle prescription;  CTL contact lenses; OD right eye; OS left eye; OU both eyes  XT exotropia; ET esotropia; PEK punctate epithelial keratitis; PEE punctate epithelial erosions; DES dry eye syndrome; MGD meibomian gland dysfunction; ATs artificial tears; PFAT's preservative free artificial tears; NSC nuclear sclerotic cataract; PSC posterior subcapsular cataract; ERM epi-retinal membrane; PVD posterior vitreous detachment; RD retinal detachment; DM diabetes mellitus; DR diabetic retinopathy; NPDR non-proliferative diabetic retinopathy; PDR proliferative diabetic retinopathy; CSME clinically significant macular edema; DME diabetic macular edema; dbh dot blot hemorrhages; CWS cotton wool spot; POAG primary open angle glaucoma; C/D cup-to-disc ratio; HVF humphrey visual field; GVF goldmann visual field; OCT optical coherence tomography; IOP intraocular pressure; BRVO Branch retinal vein occlusion; CRVO central retinal vein occlusion; CRAO central retinal artery occlusion; BRAO branch retinal artery occlusion; RT retinal tear; SB scleral buckle; PPV pars plana  vitrectomy; VH Vitreous hemorrhage; PRP panretinal laser photocoagulation; IVK intravitreal kenalog;  VMT vitreomacular traction; MH Macular hole;  NVD neovascularization of the disc; NVE neovascularization elsewhere; AREDS age related eye disease study; ARMD age related macular degeneration; POAG primary open angle glaucoma; EBMD epithelial/anterior basement membrane dystrophy; ACIOL anterior chamber intraocular lens; IOL intraocular lens; PCIOL posterior chamber intraocular lens; Phaco/IOL phacoemulsification with intraocular lens placement; PRK photorefractive keratectomy; LASIK laser assisted in situ keratomileusis; HTN hypertension; DM diabetes mellitus; COPD chronic obstructive pulmonary disease

## 2023-09-14 ENCOUNTER — Ambulatory Visit (INDEPENDENT_AMBULATORY_CARE_PROVIDER_SITE_OTHER): Payer: 59 | Admitting: Ophthalmology

## 2023-09-14 ENCOUNTER — Encounter (INDEPENDENT_AMBULATORY_CARE_PROVIDER_SITE_OTHER): Payer: Self-pay | Admitting: Ophthalmology

## 2023-09-14 DIAGNOSIS — I10 Essential (primary) hypertension: Secondary | ICD-10-CM | POA: Diagnosis not present

## 2023-09-14 DIAGNOSIS — E113313 Type 2 diabetes mellitus with moderate nonproliferative diabetic retinopathy with macular edema, bilateral: Secondary | ICD-10-CM | POA: Diagnosis not present

## 2023-09-14 DIAGNOSIS — H35033 Hypertensive retinopathy, bilateral: Secondary | ICD-10-CM | POA: Diagnosis not present

## 2023-09-14 DIAGNOSIS — Z794 Long term (current) use of insulin: Secondary | ICD-10-CM | POA: Diagnosis not present

## 2023-09-14 DIAGNOSIS — H35353 Cystoid macular degeneration, bilateral: Secondary | ICD-10-CM | POA: Diagnosis not present

## 2023-09-14 DIAGNOSIS — H40053 Ocular hypertension, bilateral: Secondary | ICD-10-CM

## 2023-09-14 DIAGNOSIS — Z961 Presence of intraocular lens: Secondary | ICD-10-CM | POA: Diagnosis not present

## 2023-09-14 DIAGNOSIS — H04123 Dry eye syndrome of bilateral lacrimal glands: Secondary | ICD-10-CM

## 2023-09-14 DIAGNOSIS — Z7985 Long-term (current) use of injectable non-insulin antidiabetic drugs: Secondary | ICD-10-CM | POA: Diagnosis not present

## 2023-09-14 MED ORDER — FARICIMAB-SVOA 6 MG/0.05ML IZ SOLN
6.0000 mg | INTRAVITREAL | Status: AC | PRN
  Administered 2023-09-14: 6 mg via INTRAVITREAL

## 2023-09-14 MED ORDER — BEVACIZUMAB CHEMO INJECTION 1.25MG/0.05ML SYRINGE FOR KALEIDOSCOPE
1.2500 mg | INTRAVITREAL | Status: AC | PRN
  Administered 2023-09-14: 1.25 mg via INTRAVITREAL

## 2023-09-15 ENCOUNTER — Other Ambulatory Visit (HOSPITAL_COMMUNITY): Payer: Self-pay

## 2023-09-15 ENCOUNTER — Other Ambulatory Visit: Payer: Self-pay

## 2023-10-20 ENCOUNTER — Ambulatory Visit: Admitting: Internal Medicine

## 2023-10-25 ENCOUNTER — Other Ambulatory Visit: Payer: Self-pay | Admitting: Internal Medicine

## 2023-10-25 ENCOUNTER — Other Ambulatory Visit: Payer: Self-pay

## 2023-10-25 DIAGNOSIS — E1159 Type 2 diabetes mellitus with other circulatory complications: Secondary | ICD-10-CM

## 2023-10-25 MED ORDER — AMLODIPINE BESYLATE 10 MG PO TABS
10.0000 mg | ORAL_TABLET | Freq: Every day | ORAL | 0 refills | Status: DC
  Filled 2023-10-25: qty 90, 90d supply, fill #0

## 2023-10-26 ENCOUNTER — Other Ambulatory Visit: Payer: Self-pay

## 2023-10-26 ENCOUNTER — Other Ambulatory Visit (HOSPITAL_COMMUNITY): Payer: Self-pay

## 2023-10-26 NOTE — Progress Notes (Signed)
 [facility] - Clinic Note  11/02/2023     CHIEF COMPLAINT Patient presents for Retina Follow Up   HISTORY OF PRESENT ILLNESS: Kimberly Frazier is a 63 y.o. female who presents to the clinic today for:   HPI     Retina Follow Up   Patient presents with  Diabetic Retinopathy.  In both eyes.  This started 7 weeks ago.  I, the attending physician,  performed the HPI with the patient and updated documentation appropriately.        Comments   Patient here for 7 weeks retina follow up for NPDR OU. Patient states vision doing ok. On occasion has eye pain when eyes dry up.       Last edited by Ronelle Coffee, MD on 11/02/2023 12:53 PM.       Referring physician: Lawrance Presume, MD [address] [place],  [place] [address]  HISTORICAL INFORMATION:   Selected notes from the MEDICAL RECORD NUMBER Referred by Dr. Albert Huff for concern of CME    CURRENT MEDICATIONS: Current Outpatient Medications (Ophthalmic Drugs)  Medication Sig   brimonidine  (ALPHAGAN ) 0.15 % ophthalmic solution Place 1 drop into both eyes 2 (two) times daily.   brimonidine  (ALPHAGAN ) 0.2 % ophthalmic solution Place 1 drop into both eyes 2 (two) times daily.   dorzolamide -timolol  (COSOPT ) 2-0.5 % ophthalmic solution Place 1 drop into both eyes 2 (two) times daily.   ketorolac  (ACULAR ) 0.5 % ophthalmic solution Place 1 drop into both eyes 4 (four) times daily.   prednisoLONE  acetate (PRED FORTE ) 1 % ophthalmic suspension Place 1 drop into both eyes daily.   No current facility-administered medications for this visit. (Ophthalmic Drugs)   Current Outpatient Medications (Other)  Medication Sig   Accu-Chek Softclix Lancets lancets Use to check blood sugar 3 times daily.   amLODipine  (NORVASC ) 10 MG tablet Take 1 tablet (10 mg total) by mouth daily. Must have office visit for refills   aspirin  EC 81 MG tablet Take 1 tablet (81 mg total) daily by mouth.   atorvastatin  (LIPITOR) 80 MG  tablet Take 1 tablet (80 mg total) by mouth once daily. (PLEASE SCHEDULE AN APPT FOR FUTURE REFILLS)   Blood Glucose Monitoring Suppl (ACCU-CHEK GUIDE) w/Device KIT 1 kit by Does not apply route in the morning, at noon, and at bedtime. Use to check blood sugar 3 times daily.   carvedilol  (COREG ) 25 MG tablet TAKE 1 TABLET (25 MG TOTAL) BY MOUTH 2 (TWO) TIMES DAILY WITH A MEAL.   Continuous Blood Gluc Receiver (FREESTYLE LIBRE 2 READER) DEVI Use as directed   Continuous Blood Gluc Sensor (FREESTYLE LIBRE SENSOR SYSTEM) MISC Change sensor every 2 weeks   diclofenac  Sodium (VOLTAREN ) 1 % GEL Apply 2 g topically 2 (two) times daily as needed.   ezetimibe  (ZETIA ) 10 MG tablet Take 1 tablet (10 mg total) by mouth daily.   glucose blood (ACCU-CHEK GUIDE) test strip Use to check blood sugar 3 times daily.   insulin  glargine (LANTUS  SOLOSTAR) 100 UNIT/ML Solostar Pen Inject 64 Units into the skin daily.   insulin  lispro (HUMALOG  KWIKPEN) 100 UNIT/ML KwikPen Inject 28 Units into the skin in the morning and at bedtime. Increase to 34 units into the skin twice daily after 1 week if blood sugars remain above 200.   Insulin  Pen Needle (BD PEN NEEDLE NANO U/F) 32G X 4 MM MISC Use as directed.   isosorbide  mononitrate (IMDUR ) 60 MG 24 hr tablet Take  1 tablet (60 mg total) by mouth daily.   Na Sulfate-K Sulfate-Mg Sulf 17.5-3.13-1.6 GM/177ML SOLN Suprep (no substitutions)-TAKE AS DIRECTED.   nitroGLYCERIN  (NITROSTAT ) 0.4 MG SL tablet Place 1 tab under tongue for chest pain.  May repeat after 5 minutes x 2.  DO NOT TAKE MORE THAN 3 TABS DURING AN EPISODE OF CHEST PAIN   potassium chloride  (KLOR-CON ) 10 MEQ tablet TAKE 2 TABLETS BY MOUTH DAILY   No current facility-administered medications for this visit. (Other)   REVIEW OF SYSTEMS: ROS   Positive for: Endocrine, Cardiovascular, Eyes Negative for: Constitutional, Gastrointestinal, Neurological, Skin, Genitourinary, Musculoskeletal, HENT, Respiratory, Psychiatric,  Allergic/Imm, Heme/Lymph Last edited by Sylvan Evener, COA on 11/02/2023  9:54 AM.      ALLERGIES Allergies  Allergen Reactions   Jardiance  [Empagliflozin ] Other (See Comments)    Frequent yeast infection   PAST MEDICAL HISTORY Past Medical History:  Diagnosis Date   Adrenal adenoma, left    Arthritis    Cancer (HCC)    CKD (chronic kidney disease), stage II    Coronary artery disease 07-28-2018 followed [facility]  currently due to no insurance   per cardiac cath 02-06-2015 (positive mild lateral ishcemia on stress test)--- dLAD 80%,  mLAD 40%,  mPDA 80% (small vessel),  ostial D1 70%,  CFx with lumial irregarlities-- medical management   Depression    Diabetic neuropathy (HCC)    Diabetic retinopathy (HCC)    NPDR OU   Headache    History of Bell's palsy 07/2011   per pt residual facial pain on left side occasionally   History of cancer of vagina 1999   per pt completed radiation and chemo   History of sepsis 12/30/2017   positive blood culter fro E.coli   Hyperlipidemia    Hypertension    Hypertensive retinopathy    OU   Hypokalemia    Insulin  dependent type 2 diabetes mellitus, uncontrolled    followed by pcp---  A1c was 11.7 on 06-15-2018 in epic   Myocardial infarction [facility])    10 yrs ago?   Nocturia    Peripheral neuropathy    PMB (postmenopausal bleeding)    Wears dentures    upper   Wears glasses    Past Surgical History:  Procedure Laterality Date   BREAST BIOPSY  2012   benign   CARDIAC CATHETERIZATION N/A 02/06/2015   Procedure: Left Heart Cath and Coronary Angiography;  Surgeon: Darlis Eisenmenger, MD;  Location: [facility] INVASIVE CV LAB;  Service: Cardiovascular;  Laterality: N/A;   CATARACT EXTRACTION Bilateral OD: 03/07/19, OS: 02/28/19   Dr. Gennie Kicks   CATARACT EXTRACTION, BILATERAL     COLONOSCOPY     normal exam in [place], [place] abour 10-12 years ago   EYE SURGERY Bilateral OD: 03/07/19, OS: 02/28/19   Cat Sx - Dr. Gennie Kicks    HYSTEROSCOPY WITH D & C N/A 08/01/2018   Procedure: DILATATION AND CURETTAGE /HYSTEROSCOPY;  Surgeon: Verlyn Goad, MD;  Location: [facility];  Service: Gynecology;  Laterality: N/A;   ROBOTIC ASSISTED TOTAL HYSTERECTOMY WITH BILATERAL SALPINGO OOPHERECTOMY N/A 11/28/2018   Procedure: XI ROBOTIC ASSISTED TOTAL HYSTERECTOMY WITH BILATERAL SALPINGO OOPHORECTOMY;  Surgeon: Alphonso Aschoff, MD;  Location: [facility];  Service: Gynecology;  Laterality: N/A;   SENTINEL NODE BIOPSY N/A 11/28/2018   Procedure: SENTINEL NODE BIOPSY;  Surgeon: Alphonso Aschoff, MD;  Location: [facility];  Service: Gynecology;  Laterality: N/A;   UMBILICAL HERNIA REPAIR  child   FAMILY HISTORY  Family History  Problem Relation Age of Onset   Heart disease Mother    Hypertension Mother    Diabetes Mother    Cancer Father        hodgkins lymphoma   Heart disease Sister        heart attack   Diabetes Sister    Colon cancer Brother 20   Diabetes Maternal Aunt    Diabetes Maternal Uncle    Cancer Other        parent   Diabetes Other        parent   Heart disease Other        parent   Hyperlipidemia Other        parent   Hypertension Other        parent   Arthritis Other        parent   Breast cancer Neg Hx    Colon polyps Neg Hx    Esophageal cancer Neg Hx    Rectal cancer Neg Hx    Stomach cancer Neg Hx    SOCIAL HISTORY Social History   Tobacco Use   Smoking status: Never   Smokeless tobacco: Never  Vaping Use   Vaping status: Never Used  Substance Use Topics   Alcohol  use: Not Currently   Drug use: Not Currently    Types: Marijuana    Comment: every 2 weeks per pt       OPHTHALMIC EXAM:  Base Eye Exam     Visual Acuity (Snellen - Linear)       Right Left   Dist [place] 20/20 -2 20/150 -2   Dist ph [place]  NI         Tonometry (Tonopen, 9:52 AM)       Right Left   Pressure 17 19         Pupils       Dark Light Shape React APD   Right 3 2 Round Brisk None   Left 3 2 Round  Brisk None         Visual Fields (Counting fingers)       Left Right    Full Full         Extraocular Movement       Right Left    Full, Ortho Full, Ortho         Neuro/Psych     Oriented x3: Yes   Mood/Affect: Normal         Dilation     Both eyes: 1.0% Mydriacyl, 2.5% Phenylephrine  @ 9:52 AM           Slit Lamp and Fundus Exam     Slit Lamp Exam       Right Left   Lids/Lashes Dermatochalasis - upper lid Dermatochalasis - upper lid   Conjunctiva/Sclera Mild Melanosis Mild Melanosis   Cornea 1+ Punctate epithelial erosions 2+ central punctate epithelial erosions, decreased TBUT, mild tear film debris, well healed temporal cataract wounds   Anterior Chamber Deep, 0.5+cell/pigment Deep, 0.5+cell/pigment   Iris Round and dilated, No NVI Round and dilated, No NVI   Lens PC IOL in good position with trace PCO PC IOL in good position with 1+PCO   Anterior Vitreous Vitreous syneresis, Posterior vitreous detachment Vitreous syneresis, Posterior vitreous detachment, vitreous condensations         Fundus Exam       Right Left   Disc Pink and Sharp Compact, Pink and Sharp, temporal PPA   C/D Ratio 0.1  0.1   Macula Good foveal reflex, persistent cystic changes / edema inferior macula, minimal Microaneurysms Blunted foveal reflex, central atrophy, +exudates/MA/IRH -- greatest temporal macula -- stably improved, trace cystic changes inferior macula -- slightly improved, no heme   Vessels attenuated, Tortuous attenuated, Tortuous   Periphery Attached, scattered drusen, rare scattered DBH, reticular degeneration Attached, scattered drusen, scattered DBH -- greatest nasal to disc, reticular degeneration           IMAGING AND PROCEDURES  Imaging and Procedures for 11/02/2023  OCT, Retina - OU - Both Eyes        Right Eye Quality was good. Central Foveal Thickness: 261. Progression has been stable. Findings include no SRF, abnormal foveal contour, intraretinal  hyper-reflective material, intraretinal fluid, vitreomacular adhesion (Persistent IRF/cystic changes inferior macula and fovea ).   Left Eye Quality was good. Central Foveal Thickness: 154. Progression has improved. Findings include no SRF, abnormal foveal contour, retinal drusen , subretinal hyper-reflective material, intraretinal hyper-reflective material, intraretinal fluid, outer retinal atrophy, vitreomacular adhesion (Persistent IRF/IRHM inferior and temporal mac and fovea -- slightly improved, +central ORA).   Notes  *Images captured and stored on drive  Diagnosis / Impression:  Macular edema OU - likely combination of CME + DME OU OD: Persistent IRF/cystic changes inferior macula and fovea  OS: Persistent IRF/IRHM inferior and temporal mac and fovea -- slightly improved, +central ORA  Clinical management:  See below  Abbreviations: NFP - Normal foveal profile. CME - cystoid macular edema. PED - pigment epithelial detachment. IRF - intraretinal fluid. SRF - subretinal fluid. EZ - ellipsoid zone. ERM - epiretinal membrane. ORA - outer retinal atrophy. ORT - outer retinal tubulation. SRHM - subretinal hyper-reflective material      Intravitreal Injection, Pharmacologic Agent - OD - Right Eye       Time Out 11/02/2023. 11:28 AM. Confirmed correct patient, procedure, site, and patient consented.   Anesthesia Topical anesthesia was used. Anesthetic medications included Lidocaine  2%, Proparacaine 0.5%.   Procedure Preparation included 5% betadine to ocular surface, eyelid speculum. A supplied (32g) needle was used.   Injection: 1.25 mg Bevacizumab  1.25mg /0.69ml   Route: Intravitreal, Site: Right Eye   NDC: [id number], Lot: [id number], Expiration date: 12/09/2023   Post-op Post injection exam found visual acuity of at least counting fingers. The patient tolerated the procedure well. There were no complications. The patient received written and verbal post procedure care education.       Intravitreal Injection, Pharmacologic Agent - OS - Left Eye        Time Out 11/02/2023. 11:28 AM. Confirmed correct patient, procedure, site, and patient consented.   Anesthesia Topical anesthesia was used. Anesthetic medications included Lidocaine  2%, Proparacaine 0.5%.   Procedure Preparation included 5% betadine to ocular surface, eyelid speculum. A (32g) needle was used.   Injection: 6 mg faricimab -svoa 6 MG/0.05ML (Patient supplied)   Route: Intravitreal, Site: Left Eye   NDC: 56387-564-33, Lot: [id number], Expiration date: 02/27/2025, Waste: 0 mL   Post-op Post injection exam found visual acuity of at least counting fingers. The patient tolerated the procedure well. There were no complications. The patient received written and verbal post procedure care education.   Notes  **SAMPLE MEDICATION ADMINISTERED**            ASSESSMENT/PLAN:    ICD-10-CM   1. Moderate nonproliferative diabetic retinopathy of both eyes with macular edema associated with type 2 diabetes mellitus (HCC)  E11.3313 OCT, Retina - OU - Both Eyes    Intravitreal Injection,  Pharmacologic Agent - OD - Right Eye    Intravitreal Injection, Pharmacologic Agent - OS - Left Eye    faricimab -svoa (VABYSMO ) 6mg /0.49mL intravitreal injection    Bevacizumab  (AVASTIN ) SOLN 1.25 mg    2. Current use of insulin  (HCC)  Z79.4     3. Long-term (current) use of injectable non-insulin  antidiabetic drugs  Z79.85     4. CME (cystoid macular edema), bilateral  H35.353     5. Essential hypertension  I10     6. Hypertensive retinopathy of both eyes  H35.033     7. Pseudophakia of both eyes  Z96.1     8. 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 Dry eyes, bilateral  H04.123       1-4. Moderate Non-proliferative diabetic retinopathy, OU OD with combination DME/CME  - last A1c 8.7 (11.12.24), 8.2 (03.08.24); 9.6 (08.07.23), 10.1 (1.31.23)  - pt has stopped Ozempic  recently due to change in vision -  h/o delayed to follow up from September 2021 to April 2022--7 mos instead of 4 wks**             - exam shows scattered IRH, edema and tortuous blood vessels OU - FA (10.23.20) shows leaking MA OU and paracentral petaloid hyperfluorecence OD --> CME/[place] - s/p IVA OD #1 (11.06.20), #2 (12.18.20), #3 (02.24.21), #4 (03.24.21), #5 (04.21.21), #6 (5.26.21), #7 (07.26.21), #8 (08.25.21), #9 (09.23.21), #10 (04.26.22), #11 (01.08.25), #12 (02.26.25), #13 (04.16.25) - s/p IVA OS #1 (10.23.20), #2 (11.18.20), #3 (12.18.20), #4 (02.24.21), #5 (03.24.21) -- IVA resistance  ============================= - s/p IVE OD #1 (05.25.22), #2 (07.01.22), #3 (07.29.22), #4 (08.29.22 - JDM), #5 (09.26.22), #6 (10.24.22), #7 (11.21.22), #8 (12.19.22), #9 (01.23.23), #10 (02.20.23), #11 (03.22.23), #12 (04.19.23), #13 (05.17.23), #14 (06.14.23), #15 (08.04.23), #16 (09.01.23), #17 (10.10.23), #18 (11.14.23), #19 (12.19.23), #20 (02.13.24), #21 (03.20.24), #22 (03.20.24), #23 (03.20.24), #24 (04.24.24) #25(06.28.24), #26 (08.16.24), #27 (10.02.24), #28 (11.20.24) - s/p IVE OS #1 (04.21.21), #2 (5.26.21), #3 (07.26.21), #4 (08.25.21), #5 (09.23.21), #6 (04.26.22), #7 (05.25.22), #8 (07.01.22), #9 (07.29.22), #10 (08.29.22 - JDM), #11 (09.26.22), #12 (10.24.22) -- IVE resistance ============================= - s/p IVV OS #1 (11.21.22, sample), #2 (12.19.22), #3 (01.23.23), #4 (02.20.23), #5 (03.22.23), #6 (04.19.23), #7 (05.17.23), #8 (06.14.23, sample), #9 (08.04.23), #10 (9.01.23), #11 (10.10.23), #12 (11.14.23), #13 (12.19.23), #14 (02.13.24), #15 (03.20.24) #16(3.20.24), #17 (03.20.24), #18 (04.24.24) #19(06.28.24), #20 (08.16.24), #21 (10.02.24), #22 (11.20.24), #23 (01.08.25 -- SAMPLE), #24 (02.26.25 -- SAMPLE), #25 (04.16.25 - SAMPLE) - BCVA OD 20/20 -- stable; OS stable at 20/150  - OCT today: OD: Persistent IRF/cystic changes inferior macula and fovea; OS: Persistent IRF/IRHM inferior and temporal mac and fovea --  slightly improved, +central ORA at 7 weeks - recommend IVA OD #14 and IVV OS #26 [sample] today, 06.04.25 with follow up in 7 weeks again - [employer] funding unavailable at this time -- pt wishes to proceed w/ injections             - RBA of procedure discussed, questions answered - Vabysmo  OS consent form signed and scanned on 11.20.24 - Avastin  OD informed consent signed and scanned on 01.08.25 - see procedure note             - continue Ketorolac  QID OU             - f/u in 7 weeks, DFE, OCT, possible injection OU   5,6. Hypertensive retinopathy OU             - discussed  importance of tight BP control             - continue to monitor   7. Pseudophakia OU            - s/p CE/IOL OU Gennie Kicks), OD: 03/07/2019, OS: 02/28/2019             - IOLs in good position             - CME OU as above             - continue to monitor   8. Ocular Hypertension OU             - IOP: 17, 19 - h/o steroid response             - cont Cosopt  BID OU and Brimonidine  BID OU  9. Dry eyes OU - recommend artificial tears and lubricating ointment as needed   Ophthalmic Meds Ordered this visit:  Meds ordered this encounter  Medications   faricimab -svoa (VABYSMO ) 6mg /0.33mL intravitreal injection   Bevacizumab  (AVASTIN ) SOLN 1.25 mg     Return in about 7 weeks (around 12/21/2023) for f/u NPDR OU, DFE, OCT, Possible Injxn.  There are no Patient Instructions on file for this visit.  This document serves as a record of services personally performed by Jeanice Millard, MD, PhD. It was created on their behalf by Angelia Kelp, an ophthalmic technician. The creation of this record is the provider's dictation and/or activities during the visit.    Electronically signed by: Angelia Kelp, OA, 11/09/23  3:40 PM  This document serves as a record of services personally performed by Jeanice Millard, MD, PhD. It was created on their behalf by Morley Arabia. Bevin Bucks, OA an ophthalmic technician. The creation of  this record is the provider's dictation and/or activities during the visit.    Electronically signed by: Morley Arabia. Bevin Bucks, OA 11/09/23 3:40 PM  Jeanice Millard, M.D., Ph.D. Diseases & Surgery of the Retina and Vitreous [facility]  I have reviewed the above documentation for accuracy and completeness, and I agree with the above. Jeanice Millard, M.D., Ph.D. 11/09/23 3:41 PM   Abbreviations: M myopia (nearsighted); A astigmatism; H hyperopia (farsighted); P presbyopia; Mrx spectacle prescription;  CTL contact lenses; OD right eye; OS left eye; OU both eyes  XT exotropia; ET esotropia; PEK punctate epithelial keratitis; PEE punctate epithelial erosions; DES dry eye syndrome; MGD meibomian gland dysfunction; ATs artificial tears; PFAT's preservative free artificial tears; NSC nuclear sclerotic cataract; PSC posterior subcapsular cataract; ERM epi-retinal membrane; PVD posterior vitreous detachment; RD retinal detachment; DM diabetes mellitus; DR diabetic retinopathy; NPDR non-proliferative diabetic retinopathy; PDR proliferative diabetic retinopathy; CSME clinically significant macular edema; DME diabetic macular edema; dbh dot blot hemorrhages; CWS cotton wool spot; POAG primary open angle glaucoma; C/D cup-to-disc ratio; HVF humphrey visual field; GVF goldmann visual field; OCT optical coherence tomography; IOP intraocular pressure; BRVO Branch retinal vein occlusion; CRVO central retinal vein occlusion; CRAO central retinal artery occlusion; BRAO branch retinal artery occlusion; RT retinal tear; SB scleral buckle; PPV pars plana vitrectomy; VH Vitreous hemorrhage; PRP panretinal laser photocoagulation; IVK intravitreal kenalog; VMT vitreomacular traction; MH Macular hole;  NVD neovascularization of the disc; NVE neovascularization elsewhere; AREDS age related eye disease study; ARMD age related macular degeneration; POAG primary open angle glaucoma; EBMD epithelial/anterior basement  membrane dystrophy; ACIOL anterior chamber intraocular lens; IOL intraocular lens; PCIOL posterior chamber intraocular lens; Phaco/IOL phacoemulsification with intraocular lens  placement; PRK photorefractive keratectomy; LASIK laser assisted in situ keratomileusis; HTN hypertension; DM diabetes mellitus; COPD chronic obstructive pulmonary disease

## 2023-11-02 ENCOUNTER — Encounter (INDEPENDENT_AMBULATORY_CARE_PROVIDER_SITE_OTHER): Payer: Self-pay | Admitting: Ophthalmology

## 2023-11-02 ENCOUNTER — Ambulatory Visit (INDEPENDENT_AMBULATORY_CARE_PROVIDER_SITE_OTHER): Admitting: Ophthalmology

## 2023-11-02 DIAGNOSIS — Z961 Presence of intraocular lens: Secondary | ICD-10-CM | POA: Diagnosis not present

## 2023-11-02 DIAGNOSIS — H35353 Cystoid macular degeneration, bilateral: Secondary | ICD-10-CM | POA: Diagnosis not present

## 2023-11-02 DIAGNOSIS — H04123 Dry eye syndrome of bilateral lacrimal glands: Secondary | ICD-10-CM

## 2023-11-02 DIAGNOSIS — I10 Essential (primary) hypertension: Secondary | ICD-10-CM | POA: Diagnosis not present

## 2023-11-02 DIAGNOSIS — H40053 Ocular hypertension, bilateral: Secondary | ICD-10-CM

## 2023-11-02 DIAGNOSIS — E113313 Type 2 diabetes mellitus with moderate nonproliferative diabetic retinopathy with macular edema, bilateral: Secondary | ICD-10-CM | POA: Diagnosis not present

## 2023-11-02 DIAGNOSIS — H35033 Hypertensive retinopathy, bilateral: Secondary | ICD-10-CM | POA: Diagnosis not present

## 2023-11-02 DIAGNOSIS — Z7985 Long-term (current) use of injectable non-insulin antidiabetic drugs: Secondary | ICD-10-CM

## 2023-11-02 DIAGNOSIS — Z794 Long term (current) use of insulin: Secondary | ICD-10-CM | POA: Diagnosis not present

## 2023-11-02 MED ORDER — FARICIMAB-SVOA 6 MG/0.05ML IZ SOLN
6.0000 mg | INTRAVITREAL | Status: AC | PRN
  Administered 2023-11-02: 6 mg via INTRAVITREAL

## 2023-11-02 MED ORDER — BEVACIZUMAB CHEMO INJECTION 1.25MG/0.05ML SYRINGE FOR KALEIDOSCOPE
1.2500 mg | INTRAVITREAL | Status: AC | PRN
  Administered 2023-11-02: 1.25 mg via INTRAVITREAL

## 2023-11-04 ENCOUNTER — Other Ambulatory Visit: Payer: Self-pay | Admitting: Internal Medicine

## 2023-11-04 DIAGNOSIS — E876 Hypokalemia: Secondary | ICD-10-CM

## 2023-11-07 ENCOUNTER — Telehealth: Payer: Self-pay | Admitting: Pharmacist

## 2023-11-07 NOTE — Telephone Encounter (Signed)
 Hey friends. Can we try to get this patient scheduled with PCP as soon as possible?

## 2023-11-21 ENCOUNTER — Ambulatory Visit (HOSPITAL_COMMUNITY)
Admission: RE | Admit: 2023-11-21 | Discharge: 2023-11-21 | Disposition: A | Discharge status: Home / Self Care | Source: Ambulatory Visit | Attending: Nurse Practitioner | Admitting: Nurse Practitioner

## 2023-11-21 ENCOUNTER — Ambulatory Visit: Payer: Self-pay

## 2023-11-21 ENCOUNTER — Other Ambulatory Visit: Payer: Self-pay

## 2023-11-21 ENCOUNTER — Encounter (HOSPITAL_COMMUNITY): Payer: Self-pay

## 2023-11-21 VITALS — BP 125/83 | HR 77 | Temp 98.8°F | Resp 22

## 2023-11-21 DIAGNOSIS — M545 Low back pain, unspecified: Secondary | ICD-10-CM | POA: Diagnosis not present

## 2023-11-21 DIAGNOSIS — R300 Dysuria: Secondary | ICD-10-CM | POA: Diagnosis not present

## 2023-11-21 DIAGNOSIS — R600 Localized edema: Secondary | ICD-10-CM

## 2023-11-21 LAB — POCT URINALYSIS DIP (MANUAL ENTRY)
Bilirubin, UA: NEGATIVE
Glucose, UA: NEGATIVE mg/dL
Ketones, POC UA: NEGATIVE mg/dL
Leukocytes, UA: NEGATIVE
Nitrite, UA: NEGATIVE
Spec Grav, UA: 1.015 (ref 1.010–1.025)
Urobilinogen, UA: 0.2 U/dL
pH, UA: 5.5 (ref 5.0–8.0)

## 2023-11-21 MED ORDER — METHOCARBAMOL 500 MG PO TABS: 500.0000 mg | ORAL_TABLET | Freq: Two times a day (BID) | ORAL | 0 refills | Status: DC | PRN

## 2023-11-21 NOTE — ED Provider Notes (Signed)
 [facility]    CSN: [id number] Arrival date & time: 11/21/23  1752      History   Chief Complaint Chief Complaint  Patient presents with   Back Pain    HPI Kimberly Frazier is a 63 y.o. female.   Discussed the use of AI scribe software for clinical note transcription with the patient, who gave verbal consent to proceed.   Kimberly Frazier is a 63 year old female presenting with concerns of a possible urinary tract infection, bilateral foot swelling, and low back pain that occasionally radiates to the thighs. She reports dysuria, dark-colored urine, and vaginal itching. She recently traveled to and from [place]  by car to visit family, a trip that spanned three days with frequent stops. She denies any calf pain or localized leg swelling. She notes that the swelling in her feet and ankles causes discomfort with walking. She also reports chronic shortness of breath and palpitations, which have not changed from her baseline. She denies a history of congestive heart failure and is not currently taking diuretics. Additionally, she experiences stress incontinence with sneezing, coughing, and occasionally at night. She denies any vaginal discharge, fever, chest pain, dizziness, numbness, or weakness. Tylenol  has been helpful in managing her back pain. She contacted her primary care provider earlier today and has a follow-up appointment scheduled for July 8. She was advised by their office to come to urgent care today for evaluation of a possible UTI.  The following portions of the patient's history were reviewed and updated as appropriate: allergies, current medications, past family history, past medical history, past social history, past surgical history, and problem list.    Past Medical History:  Diagnosis Date   Adrenal adenoma, left    Arthritis    Cancer (HCC)    CKD (chronic kidney disease), stage II    Coronary artery disease 07-28-2018 followed [facility]   currently due to no insurance   per cardiac cath 02-06-2015 (positive mild lateral ishcemia on stress test)--- dLAD 80%,  mLAD 40%,  mPDA 80% (small vessel),  ostial D1 70%,  CFx with lumial irregarlities-- medical management   Depression    Diabetic neuropathy (HCC)    Diabetic retinopathy (HCC)    NPDR OU   Headache    History of Bell's palsy 07/2011   per pt residual facial pain on left side occasionally   History of cancer of vagina 1999   per pt completed radiation and chemo   History of sepsis 12/30/2017   positive blood culter fro E.coli   Hyperlipidemia    Hypertension    Hypertensive retinopathy    OU   Hypokalemia    Insulin  dependent type 2 diabetes mellitus, uncontrolled    followed by pcp---  A1c was 11.7 on 06-15-2018 in epic   Myocardial infarction [facility])    10 yrs ago?   Nocturia    Peripheral neuropathy    PMB (postmenopausal bleeding)    Wears dentures    upper   Wears glasses     Patient Active Problem List   Diagnosis Date Noted   Low back pain 04/22/2023   Stage 3b chronic kidney disease (HCC) 08/06/2022   Fecal smearing 09/16/2020   Urinary incontinence, mixed 09/16/2020   Major depressive disorder, single episode, mild (HCC) 09/16/2020   History of endometrial cancer 07/30/2020   New onset headache 12/21/2019   Cerebral vascular disease 12/21/2019   Migraine without aura and without status migrainosus, not intractable 11/12/2019   Hematuria  11/28/2018   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   History of radiation therapy 11/28/2018   Diabetes mellitus (HCC) 10/06/2018   Type 2 diabetes mellitus with hyperglycemia, with long-term current use of insulin  (HCC) 10/06/2018   Adrenal adenoma, left 06/15/2018   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   Postmenopausal vaginal bleeding 06/15/2018   Hyperkalemia    Diabetic peripheral neuropathy (HCC) 09/03/2016   CAD (coronary artery disease) 04/10/2015   Neck pain 06/20/2014   Severe obesity (BMI >= 40)  (HCC) 09/20/2013   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 Roz   CATARACT EXTRACTION, BILATERAL     COLONOSCOPY     normal exam in [place], [place] abour 10-12 years ago   EYE SURGERY Bilateral OD: 03/07/19, OS: 02/28/19   Cat Sx - Dr. Roz   HYSTEROSCOPY WITH D & C N/A 08/01/2018   Procedure: DILATATION AND CURETTAGE /HYSTEROSCOPY;  Surgeon: Alger Gong, MD;  Location: [facility];  Service: Gynecology;  Laterality: N/A;   ROBOTIC ASSISTED TOTAL HYSTERECTOMY WITH BILATERAL SALPINGO OOPHERECTOMY N/A 11/28/2018   Procedure: XI ROBOTIC ASSISTED TOTAL HYSTERECTOMY WITH BILATERAL SALPINGO OOPHORECTOMY;  Surgeon: Eloy Herring, MD;  Location: [facility];  Service: Gynecology;  Laterality: N/A;   SENTINEL NODE BIOPSY N/A 11/28/2018   Procedure: SENTINEL NODE BIOPSY;  Surgeon: Eloy Herring, MD;  Location: [facility];  Service: Gynecology;  Laterality: N/A;   UMBILICAL HERNIA REPAIR  child    OB History     Gravida  4   Para  2   Term  2   Preterm  0   AB  2   Living  2      SAB  0   IAB  2   Ectopic  0   Multiple  0   Live Births  2            Home Medications    Prior to Admission medications   Medication Sig Start Date End Date Taking? Authorizing Provider  methocarbamol (ROBAXIN) 500 MG tablet Take 1 tablet (500 mg total) by mouth 2 (two) times daily as needed (low back pain). 11/21/23  Yes Iola Lukes, FNP  Accu-Chek Softclix Lancets lancets Use to check blood sugar 3 times  daily. 07/14/21   [name], MD  amLODipine  (NORVASC ) 10 MG tablet Take 1 tablet (10 mg total) by mouth daily. Must have office visit for refills 10/25/23   [name], MD  aspirin  EC 81 MG tablet Take 1 tablet (81 mg total) daily by mouth. 04/18/17   [name], MD  atorvastatin  (LIPITOR) 80 MG tablet Take 1 tablet (80 mg total) by mouth once daily. (PLEASE SCHEDULE AN APPT FOR FUTURE REFILLS) 04/12/23   [name], MD  Blood Glucose Monitoring Suppl (ACCU-CHEK GUIDE) w/Device KIT 1 kit by Does not apply route in the morning, at noon, and at bedtime. Use to check blood sugar 3 times daily. 08/27/19   [name], MD  brimonidine  (ALPHAGAN ) 0.15 % ophthalmic solution Place 1 drop into both  eyes 2 (two) times daily. 01/14/23   Valdemar Rogue, MD  brimonidine  (ALPHAGAN ) 0.2 % ophthalmic solution Place 1 drop into both eyes 2 (two) times daily. 05/12/23   Valdemar Rogue, MD  carvedilol  (COREG ) 25 MG tablet TAKE 1 TABLET (25 MG TOTAL) BY MOUTH 2 (TWO) TIMES DAILY WITH A MEAL. 04/12/23   [name], MD  Continuous Blood Gluc Receiver (FREESTYLE LIBRE 2 READER) DEVI Use as directed 02/08/22   [name], MD  Continuous Blood Gluc Sensor (FREESTYLE LIBRE SENSOR SYSTEM) MISC Change sensor every 2 weeks 02/08/22   [name], MD  diclofenac  Sodium (VOLTAREN ) 1 % GEL Apply 2 g topically 2 (two) times daily as needed. 02/08/22   [name], MD  dorzolamide -timolol  (COSOPT ) 2-0.5 % ophthalmic solution Place 1 drop into both eyes 2 (two) times daily. 06/26/23   Valdemar Rogue, MD  ezetimibe  (ZETIA ) 10 MG tablet Take 1 tablet (10 mg total) by mouth daily. 12/20/22 12/19/23  Lonni Slain, MD  glucose blood (ACCU-CHEK GUIDE) test strip Use to check blood sugar 3 times daily. 02/08/22   [name], MD  insulin  glargine (LANTUS  SOLOSTAR) 100 UNIT/ML Solostar Pen Inject 64 Units into the skin daily. 08/24/23   [name], MD  insulin   lispro (HUMALOG  KWIKPEN) 100 UNIT/ML KwikPen Inject 28 Units into the skin in the morning and at bedtime. Increase to 34 units into the skin twice daily after 1 week if blood sugars remain above 200. 08/24/23   [name], MD  Insulin  Pen Needle (BD PEN NEEDLE NANO U/F) 32G X 4 MM MISC Use as directed. 08/12/21   [name], MD  isosorbide  mononitrate (IMDUR ) 60 MG 24 hr tablet Take 1 tablet (60 mg total) by mouth daily. 06/27/23   Lonni Slain, MD  ketorolac  (ACULAR ) 0.5 % ophthalmic solution Place 1 drop into both eyes 4 (four) times daily. 05/12/23   Valdemar Rogue, MD  Na Sulfate-K Sulfate-Mg Sulf 17.5-3.13-1.6 GM/177ML SOLN Suprep (no substitutions)-TAKE AS DIRECTED. 11/19/20   Mansouraty, Aloha Raddle., MD  nitroGLYCERIN  (NITROSTAT ) 0.4 MG SL tablet Place 1 tab under tongue for chest pain.  May repeat after 5 minutes x 2.  DO NOT TAKE MORE THAN 3 TABS DURING AN EPISODE OF CHEST PAIN 02/02/23   Lonni Slain, MD  potassium chloride  (KLOR-CON ) 10 MEQ tablet TAKE 2 TABLETS BY MOUTH DAILY 10/22/22   [name], MD  prednisoLONE  acetate (PRED FORTE ) 1 % ophthalmic suspension Place 1 drop into both eyes daily. 04/13/22   Valdemar Rogue, MD    Family History Family History  Problem Relation Age of Onset   Heart disease Mother    Hypertension Mother    Diabetes Mother    Cancer Father        hodgkins lymphoma   Heart disease Sister        heart attack   Diabetes Sister    Colon cancer Brother 63   Diabetes Maternal Aunt    Diabetes Maternal Uncle    Cancer Other        parent   Diabetes Other        parent   Heart disease Other        parent   Hyperlipidemia Other        parent   Hypertension Other        parent   Arthritis Other        parent   Breast cancer Neg Hx  Colon polyps Neg Hx    Esophageal cancer Neg Hx    Rectal cancer Neg Hx    Stomach cancer Neg Hx     Social History Social History   Tobacco Use   Smoking status: Never    Smokeless tobacco: Never  Vaping Use   Vaping status: Never Used  Substance Use Topics   Alcohol  use: Not Currently   Drug use: Not Currently    Types: Marijuana    Comment: every 2 weeks per pt     Allergies   Jardiance  [empagliflozin ]   Review of Systems Review of Systems  Constitutional:  Negative for fever.  Respiratory:  Positive for shortness of breath (chronic SOB when walking for years and unchanged from usual).   Cardiovascular:  Positive for leg swelling (bilateral feet swelling). Negative for chest pain and palpitations.  Gastrointestinal:  Negative for nausea and vomiting.  Genitourinary:  Positive for decreased urine volume and dysuria. Negative for menstrual problem (postmenopausal s/p hysterectomy) and vaginal discharge.       Some odor to the urine; darker in color; vaginal itching; stress incontinence    Musculoskeletal:  Positive for back pain (low back, L>R).  Neurological:  Negative for numbness.  All other systems reviewed and are negative.    Physical Exam Triage Vital Signs ED Triage Vitals  Encounter Vitals Group     BP 11/21/23 1828 125/83     Girls Systolic BP Percentile --      Girls Diastolic BP Percentile --      Boys Systolic BP Percentile --      Boys Diastolic BP Percentile --      Pulse Rate 11/21/23 1828 77     Resp 11/21/23 1828 (!) 22     Temp 11/21/23 1828 98.8 F (37.1 C)     Temp Source 11/21/23 1828 Oral     SpO2 11/21/23 1828 95 %     Weight --      Height --      Head Circumference --      Peak Flow --      Pain Score 11/21/23 1824 0     Pain Loc --      Pain Education --      Exclude from Growth Chart --    No data found.  Updated Vital Signs BP 125/83 (BP Location: Left Arm) Comment (BP Location): large cuff  Pulse 77   Temp 98.8 F (37.1 C) (Oral)   Resp (!) 22   SpO2 95%   Visual Acuity Right Eye Distance:   Left Eye Distance:   Bilateral Distance:    Right Eye Near:   Left Eye Near:    Bilateral  Near:     Physical Exam Vitals and nursing note reviewed.  Constitutional:      General: She is not in acute distress.    Appearance: Normal appearance. She is not ill-appearing, toxic-appearing or diaphoretic.  HENT:     Head: Normocephalic.     Mouth/Throat:     Mouth: Mucous membranes are moist.   Cardiovascular:     Rate and Rhythm: Normal rate and regular rhythm.     Pulses:          Dorsalis pedis pulses are 2+ on the right side and 2+ on the left side.     Heart sounds: Normal heart sounds.  Pulmonary:     Effort: Pulmonary effort is normal.     Breath sounds: Normal breath sounds and air  entry.  Abdominal:     Palpations: Abdomen is soft.     Tenderness: There is no right CVA tenderness or left CVA tenderness.   Musculoskeletal:        General: Normal range of motion.     Cervical back: Normal, normal range of motion and neck supple.     Thoracic back: Normal.     Lumbar back: Tenderness present. No swelling, deformity, lacerations or spasms. Normal range of motion. Negative right straight leg raise test and negative left straight leg raise test.     Right lower leg: 2+ Edema present.     Left lower leg: 2+ Edema present.  Feet:     Right foot:     Skin integrity: Skin integrity normal.     Left foot:     Skin integrity: Skin integrity normal.   Skin:    General: Skin is warm and dry.   Neurological:     General: No focal deficit present.     Mental Status: She is alert and oriented to person, place, and time.     Cranial Nerves: Cranial nerves 2-12 are intact.     Sensory: Sensation is intact.     Motor: Motor function is intact. No weakness.     Coordination: Coordination is intact.     Gait: Gait is intact.      UC Treatments / Results  Labs (all labs ordered are listed, but only abnormal results are displayed) Labs Reviewed  POCT URINALYSIS DIP (MANUAL ENTRY) - Abnormal; Notable for the following components:      Result Value   Clarity, UA clear  (*)    Blood, UA trace-intact (*)    Protein Ur, POC trace (*)    All other components within normal limits  URINE CULTURE    EKG   Radiology No results found.  Procedures Procedures (including critical care time)  Medications Ordered in UC Medications - No data to display  Initial Impression / Assessment and Plan / UC Course  I have reviewed the triage vital signs and the nursing notes.  Pertinent labs & imaging results that were available during my care of the patient were reviewed by me and considered in my medical decision making (see chart for details).     The patient presents with symptoms suggestive of a urinary tract infection, including mild dysuria, urinary odor, and darker urine. Urinalysis was negative for infection but showed slight proteinuria. A urine culture was obtained to confirm the diagnosis and guide treatment; no antibiotics were initiated pending results. The patient will follow up with their primary care provider on July 8 or sooner if symptoms worsen. The patient also reports new bilateral foot swelling over the past week with associated pain during ambulation. Recent prolonged car travel and underlying diabetes (last A1C 8.7 in November) may be contributing factors. No history of heart failure, though the patient reports clogged arteries. Mild kidney dysfunction was noted in March. Compression stockings and sodium restriction were recommended for edema management. The patient was advised to call their primary care provider tomorrow to be placed on the cancellation list for an earlier appointment and to seek emergency care if shortness of breath or chest pain occurs. Chronic low back pain was also discussed, with the patient reporting left-sided pain radiating into the thighs, likely worsened by recent travel. No associated neurological symptoms are present. A muscle relaxer was prescribed for nighttime use, and the patient was advised to continue Tylenol  and use  a heating pad as needed. Lastly, the patient reports chronic shortness of breath and palpitations without recent changes. Cardiovascular exam was normal, and current management will continue. The patient is to follow up with primary care on July 8 or sooner if symptoms worsen. ED evaluation is advised for any sudden chest pain, worsening shortness of breath, or signs of systemic infection.   Final Clinical Impressions(s) / UC Diagnoses   Final diagnoses:  Bilateral low back pain without sciatica, unspecified chronicity  Dysuria  Bilateral lower extremity edema     Discharge Instructions      You were seen today for several concerns, including urinary symptoms, swelling in your feet, back pain, and ongoing shortness of breath with palpitations. Your urine test today did not show signs of infection, but it did show slight protein in the urine. A urine culture was sent to help determine if an infection is present, and you will be contacted if treatment is needed. For now, no antibiotics are needed. You also reported swelling in both feet that started last week, which may be related to recent travel, your diabetes, or circulation issues. You were advised to wear compression stockings and reduce sodium in your diet to help manage the swelling. For your back pain, a muscle relaxer was prescribed and you may continue Tylenol  and apply a heating pad to help relieve discomfort. You are scheduled to follow up with your primary care provider on July 8, but call their office tomorrow to ask about being added to the cancellation list for an earlier appointment. Seek immediate medical care if you develop chest pain, worsening shortness of breath, high fever, inability to urinate, new numbness or weakness in the legs, or any other sudden changes in your condition.      ED Prescriptions     Medication Sig Dispense Auth. Provider   methocarbamol (ROBAXIN) 500 MG tablet Take 1 tablet (500 mg total) by mouth 2  (two) times daily as needed (low back pain). 20 tablet Iola Lukes, FNP      PDMP not reviewed this encounter.   Iola Lukes, [place] 11/21/23 [phone]

## 2023-11-21 NOTE — Discharge Instructions (Addendum)
 You were seen today for several concerns, including urinary symptoms, swelling in your feet, back pain, and ongoing shortness of breath with palpitations. Your urine test today did not show signs of infection, but it did show slight protein in the urine. A urine culture was sent to help determine if an infection is present, and you will be contacted if treatment is needed. For now, no antibiotics are needed. You also reported swelling in both feet that started last week, which may be related to recent travel, your diabetes, or circulation issues. You were advised to wear compression stockings and reduce sodium in your diet to help manage the swelling. For your back pain, a muscle relaxer was prescribed and you may continue Tylenol  and apply a heating pad to help relieve discomfort. You are scheduled to follow up with your primary care provider on July 8, but call their office tomorrow to ask about being added to the cancellation list for an earlier appointment. Seek immediate medical care if you develop chest pain, worsening shortness of breath, high fever, inability to urinate, new numbness or weakness in the legs, or any other sudden changes in your condition.

## 2023-11-21 NOTE — Telephone Encounter (Signed)
 noted

## 2023-11-21 NOTE — ED Triage Notes (Signed)
 Symptoms started around the beginning of May-verified symptoms present for a month.  Reports back pain.  Denies urinating frequently.  Reports episodes of urinating is less and less often.  Patient has bilateral lower extremity edema  Has taken tylenol 

## 2023-11-21 NOTE — Telephone Encounter (Signed)
 FYI Only or Action Required?: FYI only for provider.  Patient was last seen in primary care on 04/12/2023 by Kimberly Barnie NOVAK, MD. Called Nurse Triage reporting No chief complaint on file.. Symptoms began several weeks ago. Interventions attempted: OTC medications: Tylenol . Symptoms are: gradually worsening.  Triage Disposition: See Physician Within 24 Hours (overriding See HCP Within 4 Hours (Or PCP Triage))  Patient/caregiver understands and will follow disposition?: Yes                             Copied from CRM [id number]. Topic: Clinical - Red Word Triage >> Nov 21, 2023 10:39 AM Kimberly Frazier wrote: Red Word that prompted transfer to Nurse Triage: Pt experiencing painful UTI. Reason for Disposition  Side (flank) or lower back pain present  Answer Assessment - Initial Assessment Questions 1. SEVERITY: How bad is the pain?  (e.g., Scale 1-10; mild, moderate, or severe)   - MILD (1-3): complains slightly about urination hurting   - MODERATE (4-7): interferes with normal activities     - SEVERE (8-10): excruciating, unwilling or unable to urinate because of the pain      Rates pain about an 8  2. FREQUENCY: How many times have you had painful urination today?      2 times  3. PATTERN: Is pain present every time you urinate or just sometimes?      Every time  4. ONSET: When did the painful urination start?      At the beginning of May, states she has been trying to hold out and symptoms are worsening  5. FEVER: Do you have a fever? If Yes, ask: What is your temperature, how was it measured, and when did it start?     Denies 6. PAST UTI: Have you had a urine infection before? If Yes, ask: When was the last time? and What happened that time?      Last year, states she has a history of UTIs 7. CAUSE: What do you think is causing the painful urination?  (e.g., UTI, scratch, Herpes sore)     UTI 8. OTHER SYMPTOMS: Do you have any other  symptoms? (e.g., blood in urine, flank pain, genital sores, urgency, vaginal discharge)     Back pain- more so on left side, hot spells/chills, denies hematuria Tylenol  for back pain  Protocols used: Urination Pain - Female-A-AH

## 2023-11-22 ENCOUNTER — Ambulatory Visit (HOSPITAL_COMMUNITY): Payer: Self-pay

## 2023-11-22 LAB — URINE CULTURE: Culture: <10000 — AB

## 2023-12-01 ENCOUNTER — Ambulatory Visit: Admitting: Internal Medicine

## 2023-12-03 ENCOUNTER — Other Ambulatory Visit: Payer: Self-pay | Admitting: Internal Medicine

## 2023-12-03 DIAGNOSIS — E876 Hypokalemia: Secondary | ICD-10-CM

## 2023-12-06 ENCOUNTER — Other Ambulatory Visit: Payer: Self-pay

## 2023-12-06 ENCOUNTER — Ambulatory Visit: Attending: Internal Medicine | Admitting: Internal Medicine

## 2023-12-06 DIAGNOSIS — E876 Hypokalemia: Secondary | ICD-10-CM

## 2023-12-06 DIAGNOSIS — N1832 Chronic kidney disease, stage 3b: Secondary | ICD-10-CM | POA: Diagnosis not present

## 2023-12-06 DIAGNOSIS — G8929 Other chronic pain: Secondary | ICD-10-CM

## 2023-12-06 DIAGNOSIS — Z794 Long term (current) use of insulin: Secondary | ICD-10-CM

## 2023-12-06 DIAGNOSIS — E11319 Type 2 diabetes mellitus with unspecified diabetic retinopathy without macular edema: Secondary | ICD-10-CM | POA: Diagnosis not present

## 2023-12-06 DIAGNOSIS — I10 Essential (primary) hypertension: Secondary | ICD-10-CM | POA: Diagnosis not present

## 2023-12-06 DIAGNOSIS — I25118 Atherosclerotic heart disease of native coronary artery with other forms of angina pectoris: Secondary | ICD-10-CM

## 2023-12-06 DIAGNOSIS — E1159 Type 2 diabetes mellitus with other circulatory complications: Secondary | ICD-10-CM

## 2023-12-06 DIAGNOSIS — Z2821 Immunization not carried out because of patient refusal: Secondary | ICD-10-CM

## 2023-12-06 DIAGNOSIS — E119 Type 2 diabetes mellitus without complications: Secondary | ICD-10-CM | POA: Diagnosis not present

## 2023-12-06 DIAGNOSIS — E1169 Type 2 diabetes mellitus with other specified complication: Secondary | ICD-10-CM | POA: Diagnosis not present

## 2023-12-06 DIAGNOSIS — M5416 Radiculopathy, lumbar region: Secondary | ICD-10-CM | POA: Diagnosis not present

## 2023-12-06 DIAGNOSIS — K219 Gastro-esophageal reflux disease without esophagitis: Secondary | ICD-10-CM

## 2023-12-06 DIAGNOSIS — Z6841 Body Mass Index (BMI) 40.0 and over, adult: Secondary | ICD-10-CM | POA: Diagnosis not present

## 2023-12-06 LAB — GLUCOSE, POCT (MANUAL RESULT ENTRY): POC Glucose: 138 mg/dL — AB (ref 70–99)

## 2023-12-06 LAB — POCT GLYCOSYLATED HEMOGLOBIN (HGB A1C): HbA1c, POC (controlled diabetic range): 10.3 % — AB (ref 0.0–7.0)

## 2023-12-06 MED ORDER — AMLODIPINE BESYLATE 10 MG PO TABS
10.0000 mg | ORAL_TABLET | Freq: Every day | ORAL | 1 refills | Status: AC
  Filled 2023-12-06 – 2024-01-09 (×2): qty 90, 90d supply, fill #0
  Filled 2024-04-16: qty 90, 90d supply, fill #1

## 2023-12-06 MED ORDER — GLIMEPIRIDE 2 MG PO TABS
2.0000 mg | ORAL_TABLET | Freq: Every day | ORAL | 3 refills | Status: DC
  Filled 2023-12-06: qty 30, 30d supply, fill #0
  Filled 2024-01-12: qty 30, 30d supply, fill #1
  Filled 2024-02-13: qty 30, 30d supply, fill #2
  Filled 2024-03-22: qty 30, 30d supply, fill #3

## 2023-12-06 MED ORDER — CARVEDILOL 25 MG PO TABS
25.0000 mg | ORAL_TABLET | Freq: Two times a day (BID) | ORAL | 1 refills | Status: DC
  Filled 2023-12-06 – 2024-01-09 (×2): qty 180, 90d supply, fill #0
  Filled 2024-02-23 – 2024-04-16 (×2): qty 180, 90d supply, fill #1

## 2023-12-06 MED ORDER — POTASSIUM CHLORIDE ER 10 MEQ PO TBCR
20.0000 meq | EXTENDED_RELEASE_TABLET | Freq: Every day | ORAL | 3 refills | Status: DC
  Filled 2023-12-06: qty 180, 90d supply, fill #0
  Filled 2024-03-06 – 2024-03-21 (×2): qty 180, 90d supply, fill #1

## 2023-12-06 MED ORDER — ATORVASTATIN CALCIUM 80 MG PO TABS
80.0000 mg | ORAL_TABLET | Freq: Every day | ORAL | 1 refills | Status: AC
  Filled 2023-12-06 – 2024-01-09 (×3): qty 90, 90d supply, fill #0
  Filled 2024-04-16: qty 90, 90d supply, fill #1

## 2023-12-06 MED ORDER — OMEPRAZOLE 20 MG PO CPDR
20.0000 mg | DELAYED_RELEASE_CAPSULE | Freq: Every day | ORAL | 3 refills | Status: DC
  Filled 2023-12-06: qty 30, 30d supply, fill #0
  Filled 2024-01-12: qty 30, 30d supply, fill #1
  Filled 2024-02-13: qty 30, 30d supply, fill #2
  Filled 2024-03-22: qty 30, 30d supply, fill #3

## 2023-12-06 NOTE — Progress Notes (Signed)
 Patient ID: Kimberly Frazier, female    DOB: [birth year]  MRN: [id number]  CC: Diabetes (DM f/u. Med refill. /Send potassium to [facility] [address] /Yes to Tdap vax)   Subjective: Kimberly Frazier is a 63 y.o. female who presents for chronic ds management. Her concerns today include:  Patient with history of DM with neuropathy and retinopathy, HTN with hypertensive heart disease, CAD (Coronary CT revealed occlusion in the mid to distal LAD, distal RCA.  Cardiology states that she will need CABG eventually), HL, obesity, endometrial CA s/p hysterectomy   Discussed the use of AI scribe software for clinical note transcription with the patient, who gave verbal consent to proceed.  History of Present Illness Kimberly Frazier is a 63 year old female with diabetes, hypertension, and hyperlipidemia who presents for follow-up.  Her diabetes management is challenging, with an increase in HbA1c from 8.7% to 10.3%. She is on Lantus  64 units nightly and Humalog  28 units twice daily, occasionally missing the second dose. Morning blood glucose levels reach up to 302, with a target of 90 to 130. Her weight increased from 305 to 318 pounds after stopping Ozempic  due to vision issues.  She experiences intermittent chest pain, described as 'a log in my chest,' which sometimes wakes her from sleep. Rolaids provide relief. There is no chest pain during physical activity. Her medications include isosorbide , carvedilol , amlodipine , atorvastatin , Zetia , and aspirin .  She has significant lower back pain, primarily on the left side, radiating down her leg, worsening with movement. This pain has persisted for over a year. She takes Tylenol  and methocarbamol , which causes drowsiness.  Her diet mainly consists of snacks like chips and Coca[employer]Cola, with some fruit and nuts. She does not cook regularly due to living alone and declined a nutritionist referral because of mobility issues and back pain.    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   History of radiation therapy 11/28/2018   Diabetes mellitus (HCC) 10/06/2018   Type 2 diabetes mellitus with hyperglycemia, with long-term current use of insulin  (HCC) 10/06/2018   Adrenal adenoma, left 06/15/2018   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   Postmenopausal vaginal bleeding 06/15/2018   Hyperkalemia    Diabetic peripheral neuropathy (HCC) 09/03/2016   CAD (coronary artery disease) 04/10/2015   Neck pain 06/20/2014   Severe obesity (BMI >= 40) (HCC) 09/20/2013   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 100 each 6   amLODipine  (NORVASC ) 10 MG tablet Take 1 tablet (10 mg total) by mouth daily. Must have office visit for refills 90 tablet 0   aspirin  EC 81 MG tablet Take 1 tablet (81 mg total) daily by mouth. 100 tablet 1   atorvastatin  (LIPITOR) 80 MG tablet Take 1 tablet (80 mg total) by mouth  once daily. (PLEASE SCHEDULE AN APPT FOR FUTURE REFILLS) 90 tablet 1   Blood Glucose Monitoring Suppl (ACCU-CHEK GUIDE) w/Device KIT 1 kit by Does not apply route in the morning, at noon, and at bedtime. Use to check blood sugar 3 times daily. 1 kit 0   brimonidine  (ALPHAGAN ) 0.15 % ophthalmic solution Place 1  drop into both eyes 2 (two) times daily. 10 mL 10   brimonidine  (ALPHAGAN ) 0.2 % ophthalmic solution Place 1 drop into both eyes 2 (two) times daily. 10 mL 6   carvedilol  (COREG ) 25 MG tablet TAKE 1 TABLET (25 MG TOTAL) BY MOUTH 2 (TWO) TIMES DAILY WITH A MEAL. 180 tablet 1   Continuous Blood Gluc Receiver (FREESTYLE LIBRE 2 READER) DEVI Use as directed 1 each 0   Continuous Blood Gluc Sensor (FREESTYLE LIBRE SENSOR SYSTEM) MISC Change sensor every 2 weeks 2 each 12   diclofenac  Sodium (VOLTAREN ) 1 % GEL Apply 2 g topically 2 (two) times daily as needed. 100 g 1   dorzolamide -timolol  (COSOPT ) 2-0.5 % ophthalmic solution Place 1 drop into both eyes 2 (two) times daily. 10 mL 3   ezetimibe  (ZETIA ) 10 MG tablet Take 1 tablet (10 mg total) by mouth daily. 90 tablet 3   glucose blood (ACCU-CHEK GUIDE) test strip Use to check blood sugar 3 times daily. 100 each 6   insulin  glargine (LANTUS  SOLOSTAR) 100 UNIT/ML Solostar Pen Inject 64 Units into the skin daily. 60 mL 1   insulin  lispro (HUMALOG  KWIKPEN) 100 UNIT/ML KwikPen Inject 28 Units into the skin in the morning and at bedtime. Increase to 34 units into the skin twice daily after 1 week if blood sugars remain above 200. 60 mL 1   Insulin  Pen Needle (BD PEN NEEDLE NANO U/F) 32G X 4 MM MISC Use as directed. 100 each 1   isosorbide  mononitrate (IMDUR ) 60 MG 24 hr tablet Take 1 tablet (60 mg total) by mouth daily. 90 tablet 3   ketorolac  (ACULAR ) 0.5 % ophthalmic solution Place 1 drop into both eyes 4 (four) times daily. 10 mL 3   methocarbamol  (ROBAXIN ) 500 MG tablet Take 1 tablet (500 mg total) by mouth 2 (two) times daily as needed (low back pain). 20 tablet 0   Na Sulfate-K Sulfate-Mg Sulf 17.5-3.13-1.6 GM/177ML SOLN Suprep (no substitutions)-TAKE AS DIRECTED. 354 mL 0   nitroGLYCERIN  (NITROSTAT ) 0.4 MG SL tablet Place 1 tab under tongue for chest pain.  May repeat after 5 minutes x 2.  DO NOT TAKE MORE THAN 3 TABS DURING AN EPISODE OF CHEST PAIN 25  tablet 3   potassium chloride  (KLOR-CON ) 10 MEQ tablet TAKE 2 TABLETS BY MOUTH DAILY 180 tablet 3   prednisoLONE  acetate (PRED FORTE ) 1 % ophthalmic suspension Place 1 drop into both eyes daily. 10 mL 1   No current facility-administered medications on file prior to visit.    Allergies  Allergen Reactions   Jardiance  [Empagliflozin ] Other (See Comments)    Frequent yeast infection    Social History   Socioeconomic History   Marital status: Single    Spouse name: Not on file   Number of children: 2   Years of education: 12   Highest education level: GED or equivalent  Occupational History   Occupation: retired  Tobacco Use   Smoking status: Never   Smokeless tobacco: Never  Vaping Use   Vaping status: Never Used  Substance and Sexual Activity   Alcohol  use: Not Currently   Drug use: Not Currently  Types: Marijuana    Comment: every 2 weeks per pt   Sexual activity: Not Currently  Other Topics Concern   Not on file  Social History Narrative   Lives at home with her grandson.   Right-handed.   No daily caffeine  use.   Social Drivers of Corporate investment banker Strain: Low Risk  (04/12/2023)   Overall Financial Resource Strain (CARDIA)    Difficulty of Paying Living Expenses: Not very hard  Food Insecurity: Food Insecurity Present (04/12/2023)   Hunger Vital Sign    Worried About Running Out of Food in the Last Year: Sometimes true    Ran Out of Food in the Last Year: Sometimes true  Transportation Needs: No Transportation Needs (04/12/2023)   PRAPARE - Administrator, Civil Service (Medical): No    Lack of Transportation (Non-Medical): No  Physical Activity: Inactive (04/12/2023)   Exercise Vital Sign    Days of Exercise per Week: 0 days    Minutes of Exercise per Session: 0 min  Stress: Stress Concern Present (04/12/2023)   [employer] of Occupational Health - Occupational Stress Questionnaire    Feeling of Stress : To some extent   Social Connections: Socially Isolated (04/12/2023)   Social Connection and Isolation Panel    Frequency of Communication with Friends and Family: More than three times a week    Frequency of Social Gatherings with Friends and Family: Once a week    Attends Religious Services: Never    Database administrator or Organizations: No    Attends Banker Meetings: Never    Marital Status: Never married  Intimate Partner Violence: Not At Risk (04/12/2023)   Humiliation, Afraid, Rape, and Kick questionnaire    Fear of Current or Ex-Partner: No    Emotionally Abused: No    Physically Abused: No    Sexually Abused: No    Family History  Problem Relation Age of Onset   Heart disease Mother    Hypertension Mother    Diabetes Mother    Cancer Father        hodgkins lymphoma   Heart disease Sister        heart attack   Diabetes Sister    Colon cancer Brother 76   Diabetes Maternal Aunt    Diabetes Maternal Uncle    Cancer Other        parent   Diabetes Other        parent   Heart disease Other        parent   Hyperlipidemia Other        parent   Hypertension Other        parent   Arthritis Other        parent   Breast cancer Neg Hx    Colon polyps Neg Hx    Esophageal cancer Neg Hx    Rectal cancer Neg Hx    Stomach cancer Neg Hx     Past Surgical History:  Procedure Laterality Date   BREAST BIOPSY  2012   benign   CARDIAC CATHETERIZATION N/A 02/06/2015   Procedure: Left Heart Cath and Coronary Angiography;  Surgeon: Ezra GORMAN Shuck, MD;  Location: [facility] INVASIVE CV LAB;  Service: Cardiovascular;  Laterality: N/A;   CATARACT EXTRACTION Bilateral OD: 03/07/19, OS: 02/28/19   Dr. Roz   CATARACT EXTRACTION, BILATERAL     COLONOSCOPY     normal exam in [place], [place] abour 10-12 years ago  EYE SURGERY Bilateral OD: 03/07/19, OS: 02/28/19   Cat Sx - Dr. Roz   HYSTEROSCOPY WITH D & C N/A 08/01/2018   Procedure: DILATATION AND CURETTAGE /HYSTEROSCOPY;  Surgeon:  Alger Gong, MD;  Location: [facility];  Service: Gynecology;  Laterality: N/A;   ROBOTIC ASSISTED TOTAL HYSTERECTOMY WITH BILATERAL SALPINGO OOPHERECTOMY N/A 11/28/2018   Procedure: XI ROBOTIC ASSISTED TOTAL HYSTERECTOMY WITH BILATERAL SALPINGO OOPHORECTOMY;  Surgeon: Eloy Herring, MD;  Location: [facility];  Service: Gynecology;  Laterality: N/A;   SENTINEL NODE BIOPSY N/A 11/28/2018   Procedure: SENTINEL NODE BIOPSY;  Surgeon: Eloy Herring, MD;  Location: [facility];  Service: Gynecology;  Laterality: N/A;   UMBILICAL HERNIA REPAIR  child    ROS: Review of Systems Negative except as stated above  PHYSICAL EXAM: BP (!) 91/56 (BP Location: Left Arm, Patient Position: Sitting, Cuff Size: Large)   Pulse 68   Temp 97.6 F (36.4 C) (Oral)   Ht 5' 4 (1.626 m)   Wt (!) 318 lb (144.2 kg)   SpO2 96%   BMI 54.58 kg/m   Wt Readings from Last 3 Encounters:  12/06/23 (!) 318 lb (144.2 kg)  04/12/23 (!) 305 lb (138.3 kg)  11/09/22 (!) 303 lb (137.4 kg)    Physical Exam  {female adult master:310786} {female adult master:310785}     Latest Ref Rng & Units 08/06/2022   11:25 AM 01/04/2022    9:06 AM 10/07/2021    9:08 AM  CMP  Glucose 70 - 99 mg/dL 871  623    BUN 8 - 27 mg/dL 14  23    Creatinine 9.42 - 1.00 mg/dL 8.87  8.66    Sodium 865 - 144 mmol/L 144  139    Potassium 3.5 - 5.2 mmol/L 3.8  3.9  3.6   Chloride 96 - 106 mmol/L 104  100    CO2 20 - 29 mmol/L 27  24    Calcium  8.7 - 10.3 mg/dL 9.2  9.8    Total Protein 6.0 - 8.5 g/dL 7.3  7.5    Total Bilirubin 0.0 - 1.2 mg/dL 0.7  0.6    Alkaline Phos 44 - 121 IU/L 140  139    AST 0 - 40 IU/L 17  16    ALT 0 - 32 IU/L 18  21     Lipid Panel     Component Value Date/Time   CHOL 126 08/06/2022 1125   TRIG 110 08/06/2022 1125   HDL 44 08/06/2022 1125   CHOLHDL 2.9 08/06/2022 1125   CHOLHDL 5 07/15/2015 1513   VLDL 44.0 (H) 07/15/2015 1513   LDLCALC 62 08/06/2022 1125   LDLDIRECT 93.0 07/15/2015 1513    CBC     Component Value Date/Time   WBC 13.3 (H) 08/06/2022 1125   WBC 10.9 (H) 03/24/2020 1427   RBC 4.86 08/06/2022 1125   RBC 4.50 03/24/2020 1427   HGB 13.4 08/06/2022 1125   HCT 42.1 08/06/2022 1125   PLT 257 08/06/2022 1125   MCV 87 08/06/2022 1125   MCH 27.6 08/06/2022 1125   MCH 28.2 03/24/2020 1427   MCHC 31.8 08/06/2022 1125   MCHC 32.2 03/24/2020 1427   RDW 14.7 08/06/2022 1125   LYMPHSABS 1.0 12/30/2017 0812   MONOABS 1.3 (H) 12/30/2017 0812   EOSABS 0.1 12/30/2017 0812   BASOSABS 0.0 12/30/2017 0812    ASSESSMENT AND PLAN:  Assessment and Plan Assessment & Plan Type 2 Diabetes Mellitus Suboptimal glycemic control with HbA1c  increased to 10.3%. Inconsistent Humalog  use. Weight gain post-Ozempic  discontinuation. Limited oral options due to side effects. Glimepiride  considered despite limited efficacy. Hesitant to restart Ozempic  due to vision concerns. - Prescribe glimepiride  2 mg once daily. - Encourage consistent Humalog  use. - Discuss dietary modifications to reduce blood glucose.  Diabetic Retinopathy Vision affected by poor glycemic control. Concerns about Ozempic 's impact on vision persist. - Continue ophthalmologist follow-up every seven weeks for injections.  Chest Pain Intermittent chest pain possibly related to GERD or cardiac issues. Increased frequency, sometimes nocturnal. No recent cardiology follow-up. - Prescribe omeprazole . - Advise avoiding spicy foods, tomato-based products, and juices. - Recommend elevating head during sleep and eating last meal 2-3 hours before lying down. - Refer to cardiologist for evaluation.  Chronic Back Pain Chronic lower back pain with occasional radicular symptoms. Exacerbated by movement. Previous orthopedic consultation suggested injections, but diabetes control was a barrier. - Order MRI of the lumbar spine. - Encourage better diabetes control for future interventions.  Hypertension Blood pressure low during visit.  On multiple antihypertensives.  Hyperlipidemia On atorvastatin  and ezetimibe  for lipid management.  General Health Maintenance Declined pneumococcal and herpes zoster vaccines. Scheduled for Medicare wellness visit. - Schedule Medicare wellness visit. - Check urine for proteinuria.     1. Type 2 diabetes mellitus with morbid obesity (HCC) (Primary) *** - POCT glycosylated hemoglobin (Hb A1C) - POCT glucose (manual entry)  2. Hypertension associated with diabetes (HCC) ***  3. Hypokalemia ***    Patient was given the opportunity to ask questions.  Patient verbalized understanding of the plan and was able to repeat key elements of the plan.   This documentation was completed using Paediatric nurse.  Any transcriptional errors are unintentional.  Orders Placed This Encounter  Procedures   POCT glycosylated hemoglobin (Hb A1C)   POCT glucose (manual entry)     Requested Prescriptions   Pending Prescriptions Disp Refills   amLODipine  (NORVASC ) 10 MG tablet 90 tablet 1    Sig: Take 1 tablet (10 mg total) by mouth daily. Must have office visit for refills   atorvastatin  (LIPITOR) 80 MG tablet 90 tablet 1    Sig: Take 1 tablet (80 mg total) by mouth once daily. (PLEASE SCHEDULE AN APPT FOR FUTURE REFILLS)   carvedilol  (COREG ) 25 MG tablet 180 tablet 1    Sig: TAKE 1 TABLET (25 MG TOTAL) BY MOUTH 2 (TWO) TIMES DAILY WITH A MEAL.   potassium chloride  (KLOR-CON ) 10 MEQ tablet 180 tablet 3    Sig: Take 2 tablets (20 mEq total) by mouth daily.    Return for Schedule 1 mo f/u with [facility] program.  Barnie Louder, MD, FACP

## 2023-12-06 NOTE — Patient Instructions (Signed)
 VISIT SUMMARY:  Today, you came in for a follow-up visit to discuss your diabetes, hypertension, hyperlipidemia, chest pain, and back pain. We reviewed your current medications, recent lab results, and discussed your symptoms and concerns.  YOUR PLAN:  -TYPE 2 DIABETES MELLITUS: Your blood sugar levels are higher than we would like, with your HbA1c increasing to 10.3%. This means your diabetes is not well controlled. We have prescribed glimepiride  2 mg once daily to help manage your blood sugar. It's important to take your Humalog  consistently and make some dietary changes to help lower your blood sugar levels.  -DIABETIC RETINOPATHY: This condition affects your vision due to high blood sugar levels. Continue seeing your ophthalmologist every seven weeks for your eye injections to manage this condition.  -CHEST PAIN: You have been experiencing chest pain, which could be related to acid reflux or heart issues. We have prescribed omeprazole  to help with acid reflux and advised you to avoid spicy foods, tomato-based products, and juices. Elevate your head during sleep and try to eat your last meal 2-3 hours before lying down. We are also referring you to a cardiologist for further evaluation.  -CHRONIC BACK PAIN: You have chronic lower back pain that sometimes radiates down your leg. We have ordered an MRI of your lumbar spine to get a better understanding of the issue. Improving your diabetes control will also help with future treatment options.  -HYPERTENSION: Your blood pressure was low during the visit. You are currently on multiple medications to manage this condition.  -HYPERLIPIDEMIA: This condition involves high levels of fats in your blood. You are taking atorvastatin  and ezetimibe  to manage your cholesterol levels.  -GENERAL HEALTH MAINTENANCE: You declined the pneumococcal and herpes zoster vaccines. We have scheduled your Medicare wellness visit and will check your urine for  proteinuria.  INSTRUCTIONS:  Please follow up with the cardiologist for your chest pain evaluation. Continue your regular ophthalmologist visits every seven weeks for your eye injections. Make sure to take your medications as prescribed and try to make the dietary changes we discussed. We have scheduled your Medicare wellness visit and will check your urine for proteinuria at that time.

## 2023-12-07 ENCOUNTER — Encounter: Payer: Self-pay | Admitting: Internal Medicine

## 2023-12-08 ENCOUNTER — Ambulatory Visit: Payer: Self-pay | Admitting: Internal Medicine

## 2023-12-08 LAB — MICROALBUMIN / CREATININE URINE RATIO
Creatinine, Urine: 341.1 mg/dL
Microalb/Creat Ratio: 32 mg/g{creat} — ABNORMAL HIGH (ref 0–29)
Microalbumin, Urine: 108.1 ug/mL

## 2023-12-08 NOTE — Progress Notes (Shared)
 [facility] - Clinic Note  12/21/2023     CHIEF COMPLAINT Patient presents for No chief complaint on file.   HISTORY OF PRESENT ILLNESS: Kimberly Frazier is a 63 y.o. female who presents to the clinic today for:       Referring physician: [name], MD [address] [place],  [place] [address]  HISTORICAL INFORMATION:   Selected notes from the MEDICAL RECORD NUMBER Referred by Dr. Oneil Platts for concern of CME    CURRENT MEDICATIONS: Current Outpatient Medications (Ophthalmic Drugs)  Medication Sig   brimonidine  (ALPHAGAN ) 0.15 % ophthalmic solution Place 1 drop into both eyes 2 (two) times daily.   brimonidine  (ALPHAGAN ) 0.2 % ophthalmic solution Place 1 drop into both eyes 2 (two) times daily.   dorzolamide -timolol  (COSOPT ) 2-0.5 % ophthalmic solution Place 1 drop into both eyes 2 (two) times daily.   ketorolac  (ACULAR ) 0.5 % ophthalmic solution Place 1 drop into both eyes 4 (four) times daily.   prednisoLONE  acetate (PRED FORTE ) 1 % ophthalmic suspension Place 1 drop into both eyes daily.   No current facility-administered medications for this visit. (Ophthalmic Drugs)   Current Outpatient Medications (Other)  Medication Sig   Accu-Chek Softclix Lancets lancets Use to check blood sugar 3 times daily.   amLODipine  (NORVASC ) 10 MG tablet Take 1 tablet (10 mg total) by mouth daily.   aspirin  EC 81 MG tablet Take 1 tablet (81 mg total) daily by mouth.   atorvastatin  (LIPITOR) 80 MG tablet Take 1 tablet (80 mg total) by mouth once daily. (PLEASE SCHEDULE AN APPT FOR FUTURE REFILLS)   Blood Glucose Monitoring Suppl (ACCU-CHEK GUIDE) w/Device KIT 1 kit by Does not apply route in the morning, at noon, and at bedtime. Use to check blood sugar 3 times daily.   carvedilol  (COREG ) 25 MG tablet TAKE 1 TABLET (25 MG TOTAL) BY MOUTH 2 (TWO) TIMES DAILY WITH A MEAL.   Continuous Blood Gluc Receiver (FREESTYLE LIBRE 2 READER) DEVI Use as directed    Continuous Blood Gluc Sensor (FREESTYLE LIBRE SENSOR SYSTEM) MISC Change sensor every 2 weeks   diclofenac  Sodium (VOLTAREN ) 1 % GEL Apply 2 g topically 2 (two) times daily as needed.   ezetimibe  (ZETIA ) 10 MG tablet Take 1 tablet (10 mg total) by mouth daily.   glimepiride  (AMARYL ) 2 MG tablet Take 1 tablet (2 mg total) by mouth daily before breakfast.   glucose blood (ACCU-CHEK GUIDE) test strip Use to check blood sugar 3 times daily.   insulin  glargine (LANTUS  SOLOSTAR) 100 UNIT/ML Solostar Pen Inject 64 Units into the skin daily.   insulin  lispro (HUMALOG  KWIKPEN) 100 UNIT/ML KwikPen Inject 28 Units into the skin in the morning and at bedtime. Increase to 34 units into the skin twice daily after 1 week if blood sugars remain above 200.   Insulin  Pen Needle (BD PEN NEEDLE NANO U/F) 32G X 4 MM MISC Use as directed.   isosorbide  mononitrate (IMDUR ) 60 MG 24 hr tablet Take 1 tablet (60 mg total) by mouth daily.   methocarbamol  (ROBAXIN ) 500 MG tablet Take 1 tablet (500 mg total) by mouth 2 (two) times daily as needed (low back pain).   Na Sulfate-K Sulfate-Mg Sulf 17.5-3.13-1.6 GM/177ML SOLN Suprep (no substitutions)-TAKE AS DIRECTED.   nitroGLYCERIN  (NITROSTAT ) 0.4 MG SL tablet Place 1 tab under tongue for chest pain.  May repeat after 5 minutes x 2.  DO NOT TAKE MORE THAN 3 TABS DURING AN EPISODE  OF CHEST PAIN   omeprazole  (PRILOSEC) 20 MG capsule Take 1 capsule (20 mg total) by mouth daily.   potassium chloride  (KLOR-CON ) 10 MEQ tablet Take 2 tablets (20 mEq total) by mouth daily.   No current facility-administered medications for this visit. (Other)   REVIEW OF SYSTEMS:    ALLERGIES Allergies  Allergen Reactions   Jardiance  [Empagliflozin ] Other (See Comments)    Frequent yeast infection   Metformin  And Related Diarrhea   PAST MEDICAL HISTORY Past Medical History:  Diagnosis Date   Adrenal adenoma, left    Arthritis    Cancer (HCC)    CKD (chronic kidney disease), stage II     Coronary artery disease 07-28-2018 followed [facility]  currently due to no insurance   per cardiac cath 02-06-2015 (positive mild lateral ishcemia on stress test)--- dLAD 80%,  mLAD 40%,  mPDA 80% (small vessel),  ostial D1 70%,  CFx with lumial irregarlities-- medical management   Depression    Diabetic neuropathy (HCC)    Diabetic retinopathy (HCC)    NPDR OU   Headache    History of Bell's palsy 07/2011   per pt residual facial pain on left side occasionally   History of cancer of vagina 1999   per pt completed radiation and chemo   History of sepsis 12/30/2017   positive blood culter fro E.coli   Hyperlipidemia    Hypertension    Hypertensive retinopathy    OU   Hypokalemia    Insulin  dependent type 2 diabetes mellitus, uncontrolled    followed by pcp---  A1c was 11.7 on 06-15-2018 in epic   Myocardial infarction [facility])    10 yrs ago?   Nocturia    Peripheral neuropathy    PMB (postmenopausal bleeding)    Wears dentures    upper   Wears glasses    Past Surgical History:  Procedure Laterality Date   BREAST BIOPSY  2012   benign   CARDIAC CATHETERIZATION N/A 02/06/2015   Procedure: Left Heart Cath and Coronary Angiography;  Surgeon: Ezra GORMAN Shuck, MD;  Location: [facility] INVASIVE CV LAB;  Service: Cardiovascular;  Laterality: N/A;   CATARACT EXTRACTION Bilateral OD: 03/07/19, OS: 02/28/19   Dr. Roz   CATARACT EXTRACTION, BILATERAL     COLONOSCOPY     normal exam in [place], [place] abour 10-12 years ago   EYE SURGERY Bilateral OD: 03/07/19, OS: 02/28/19   Cat Sx - Dr. Roz   HYSTEROSCOPY WITH D & C N/A 08/01/2018   Procedure: DILATATION AND CURETTAGE /HYSTEROSCOPY;  Surgeon: Alger Gong, MD;  Location: [facility];  Service: Gynecology;  Laterality: N/A;   ROBOTIC ASSISTED TOTAL HYSTERECTOMY WITH BILATERAL SALPINGO OOPHERECTOMY N/A 11/28/2018   Procedure: XI ROBOTIC ASSISTED TOTAL HYSTERECTOMY WITH BILATERAL SALPINGO OOPHORECTOMY;   Surgeon: Eloy Herring, MD;  Location: [facility] ORS;  Service: Gynecology;  Laterality: N/A;   SENTINEL NODE BIOPSY N/A 11/28/2018   Procedure: SENTINEL NODE BIOPSY;  Surgeon: Eloy Herring, MD;  Location: [facility];  Service: Gynecology;  Laterality: N/A;   UMBILICAL HERNIA REPAIR  child   FAMILY HISTORY Family History  Problem Relation Age of Onset   Heart disease Mother    Hypertension Mother    Diabetes Mother    Cancer Father        hodgkins lymphoma   Heart disease Sister        heart attack   Diabetes Sister    Colon cancer Brother 38   Diabetes Maternal Aunt  Diabetes Maternal Uncle    Cancer Other        parent   Diabetes Other        parent   Heart disease Other        parent   Hyperlipidemia Other        parent   Hypertension Other        parent   Arthritis Other        parent   Breast cancer Neg Hx    Colon polyps Neg Hx    Esophageal cancer Neg Hx    Rectal cancer Neg Hx    Stomach cancer Neg Hx    SOCIAL HISTORY Social History   Tobacco Use   Smoking status: Never   Smokeless tobacco: Never  Vaping Use   Vaping status: Never Used  Substance Use Topics   Alcohol  use: Not Currently   Drug use: Not Currently    Types: Marijuana    Comment: every 2 weeks per pt       OPHTHALMIC EXAM:  Not recorded    IMAGING AND PROCEDURES  Imaging and Procedures for 12/21/2023           ASSESSMENT/PLAN:  No diagnosis found.   1-4. Moderate Non-proliferative diabetic retinopathy, OU OD with combination DME/CME  - last A1c 8.7 (11.12.24), 8.2 (03.08.24); 9.6 (08.07.23), 10.1 (1.31.23)  - pt has stopped Ozempic  recently due to change in vision - h/o delayed to follow up from September 2021 to April 2022--7 mos instead of 4 wks**             - exam shows scattered IRH, edema and tortuous blood vessels OU - FA (10.23.20) shows leaking MA OU and paracentral petaloid hyperfluorecence OD --> CME/[place] - s/p IVA OD #1 (11.06.20), #2 (12.18.20), #3 (02.24.21),  #4 (03.24.21), #5 (04.21.21), #6 (5.26.21), #7 (07.26.21), #8 (08.25.21), #9 (09.23.21), #10 (04.26.22), #11 (01.08.25), #12 (02.26.25), #13 (04.16.25), #14 (06.04.25) - s/p IVA OS #1 (10.23.20), #2 (11.18.20), #3 (12.18.20), #4 (02.24.21), #5 (03.24.21) -- IVA resistance  ============================= - s/p IVE OD #1 (05.25.22), #2 (07.01.22), #3 (07.29.22), #4 (08.29.22 - JDM), #5 (09.26.22), #6 (10.24.22), #7 (11.21.22), #8 (12.19.22), #9 (01.23.23), #10 (02.20.23), #11 (03.22.23), #12 (04.19.23), #13 (05.17.23), #14 (06.14.23), #15 (08.04.23), #16 (09.01.23), #17 (10.10.23), #18 (11.14.23), #19 (12.19.23), #20 (02.13.24), #21 (03.20.24), #22 (03.20.24), #23 (03.20.24), #24 (04.24.24) #25(06.28.24), #26 (08.16.24), #27 (10.02.24), #28 (11.20.24) - s/p IVE OS #1 (04.21.21), #2 (5.26.21), #3 (07.26.21), #4 (08.25.21), #5 (09.23.21), #6 (04.26.22), #7 (05.25.22), #8 (07.01.22), #9 (07.29.22), #10 (08.29.22 - JDM), #11 (09.26.22), #12 (10.24.22) -- IVE resistance ============================= - s/p IVV OS #1 (11.21.22, sample), #2 (12.19.22), #3 (01.23.23), #4 (02.20.23), #5 (03.22.23), #6 (04.19.23), #7 (05.17.23), #8 (06.14.23, sample), #9 (08.04.23), #10 (9.01.23), #11 (10.10.23), #12 (11.14.23), #13 (12.19.23), #14 (02.13.24), #15 (03.20.24) #16(3.20.24), #17 (03.20.24), #18 (04.24.24) #19(06.28.24), #20 (08.16.24), #21 (10.02.24), #22 (11.20.24), #23 (01.08.25 -- SAMPLE), #24 (02.26.25 -- SAMPLE), #25 (04.16.25 - SAMPLE), #26 (06.04.25-SAMPLE) - BCVA OD 20/20 -- stable; OS stable at 20/150  - OCT today: OD: Persistent IRF/cystic changes inferior macula and fovea; OS: Persistent IRF/IRHM inferior and temporal mac and fovea -- slightly improved, +central ORA at 7 weeks - recommend IVA OD #15 and IVV OS #27 [sample] today, 07.23.25 with follow up in 7 weeks again - [employer] funding unavailable at this time -- pt wishes to proceed w/ injections             - RBA of procedure discussed, questions  answered - Vabysmo  OS consent form signed and scanned on 11.20.24 - Avastin  OD informed consent signed and scanned on 01.08.25 - see procedure note             - continue Ketorolac  QID OU             - f/u in 7 weeks, DFE, OCT, possible injection OU   5,6. Hypertensive retinopathy OU             - discussed importance of tight BP control             - continue to monitor   7. Pseudophakia OU            - s/p CE/IOL OU [place]), OD: 03/07/2019, OS: 02/28/2019             - IOLs in good position             - CME OU as above             - continue to monitor   8. Ocular Hypertension OU             - IOP: 17, 19 - h/o steroid response             - cont Cosopt  BID OU and Brimonidine  BID OU  9. Dry eyes OU - recommend artificial tears and lubricating ointment as needed   Ophthalmic Meds Ordered this visit:  No orders of the defined types were placed in this encounter.    No follow-ups on file.  There are no Patient Instructions on file for this visit.  This document serves as a record of services personally performed by Redell JUDITHANN Hans, MD, PhD. It was created on their behalf by Almetta Pesa, an ophthalmic technician. The creation of this record is the provider's dictation and/or activities during the visit.    Electronically signed by: Almetta Pesa, OA, 12/08/23  2:49 PM    Redell JUDITHANN Hans, M.D., Ph.D. Diseases & Surgery of the Retina and Vitreous [facility]   Abbreviations: M myopia (nearsighted); A astigmatism; H hyperopia (farsighted); P presbyopia; Mrx spectacle prescription;  CTL contact lenses; OD right eye; OS left eye; OU both eyes  XT exotropia; ET esotropia; PEK punctate epithelial keratitis; PEE punctate epithelial erosions; DES dry eye syndrome; MGD meibomian gland dysfunction; ATs artificial tears; PFAT's preservative free artificial tears; NSC nuclear sclerotic cataract; PSC posterior subcapsular cataract; ERM epi-retinal  membrane; PVD posterior vitreous detachment; RD retinal detachment; DM diabetes mellitus; DR diabetic retinopathy; NPDR non-proliferative diabetic retinopathy; PDR proliferative diabetic retinopathy; CSME clinically significant macular edema; DME diabetic macular edema; dbh dot blot hemorrhages; CWS cotton wool spot; POAG primary open angle glaucoma; C/D cup-to-disc ratio; HVF humphrey visual field; GVF goldmann visual field; OCT optical coherence tomography; IOP intraocular pressure; BRVO Branch retinal vein occlusion; CRVO central retinal vein occlusion; CRAO central retinal artery occlusion; BRAO branch retinal artery occlusion; RT retinal tear; SB scleral buckle; PPV pars plana vitrectomy; VH Vitreous hemorrhage; PRP panretinal laser photocoagulation; IVK intravitreal kenalog; VMT vitreomacular traction; MH Macular hole;  NVD neovascularization of the disc; NVE neovascularization elsewhere; AREDS age related eye disease study; ARMD age related macular degeneration; POAG primary open angle glaucoma; EBMD epithelial/anterior basement membrane dystrophy; ACIOL anterior chamber intraocular lens; IOL intraocular lens; PCIOL posterior chamber intraocular lens; Phaco/IOL phacoemulsification with intraocular lens placement; PRK photorefractive keratectomy; LASIK laser assisted in situ keratomileusis; HTN hypertension; DM diabetes mellitus; COPD chronic obstructive pulmonary  disease

## 2023-12-14 ENCOUNTER — Other Ambulatory Visit (HOSPITAL_COMMUNITY): Payer: Self-pay

## 2023-12-14 ENCOUNTER — Other Ambulatory Visit: Payer: Self-pay

## 2023-12-15 ENCOUNTER — Other Ambulatory Visit (HOSPITAL_COMMUNITY): Payer: Self-pay

## 2023-12-21 ENCOUNTER — Other Ambulatory Visit (HOSPITAL_COMMUNITY): Payer: Self-pay

## 2023-12-21 ENCOUNTER — Other Ambulatory Visit: Payer: Self-pay

## 2023-12-21 ENCOUNTER — Encounter (INDEPENDENT_AMBULATORY_CARE_PROVIDER_SITE_OTHER): Admitting: Ophthalmology

## 2023-12-21 DIAGNOSIS — H35353 Cystoid macular degeneration, bilateral: Secondary | ICD-10-CM

## 2023-12-21 DIAGNOSIS — Z794 Long term (current) use of insulin: Secondary | ICD-10-CM

## 2023-12-21 DIAGNOSIS — H40053 Ocular hypertension, bilateral: Secondary | ICD-10-CM

## 2023-12-21 DIAGNOSIS — I10 Essential (primary) hypertension: Secondary | ICD-10-CM

## 2023-12-21 DIAGNOSIS — Z7985 Long-term (current) use of injectable non-insulin antidiabetic drugs: Secondary | ICD-10-CM

## 2023-12-21 DIAGNOSIS — H35033 Hypertensive retinopathy, bilateral: Secondary | ICD-10-CM

## 2023-12-21 DIAGNOSIS — E113313 Type 2 diabetes mellitus with moderate nonproliferative diabetic retinopathy with macular edema, bilateral: Secondary | ICD-10-CM

## 2023-12-21 DIAGNOSIS — Z961 Presence of intraocular lens: Secondary | ICD-10-CM

## 2023-12-21 DIAGNOSIS — H04123 Dry eye syndrome of bilateral lacrimal glands: Secondary | ICD-10-CM

## 2023-12-24 ENCOUNTER — Ambulatory Visit
Admission: RE | Admit: 2023-12-24 | Discharge: 2023-12-24 | Disposition: A | Discharge status: Home / Self Care | Source: Ambulatory Visit | Attending: Internal Medicine | Admitting: Internal Medicine

## 2023-12-24 DIAGNOSIS — M4726 Other spondylosis with radiculopathy, lumbar region: Secondary | ICD-10-CM | POA: Diagnosis not present

## 2023-12-24 DIAGNOSIS — M4316 Spondylolisthesis, lumbar region: Secondary | ICD-10-CM | POA: Diagnosis not present

## 2023-12-24 DIAGNOSIS — M5127 Other intervertebral disc displacement, lumbosacral region: Secondary | ICD-10-CM | POA: Diagnosis not present

## 2023-12-24 DIAGNOSIS — M5416 Radiculopathy, lumbar region: Secondary | ICD-10-CM

## 2023-12-27 ENCOUNTER — Telehealth: Payer: Self-pay | Admitting: Internal Medicine

## 2023-12-27 DIAGNOSIS — M5416 Radiculopathy, lumbar region: Secondary | ICD-10-CM

## 2023-12-27 NOTE — Telephone Encounter (Signed)
 Phone call placed to patient today to go over the results of the lumbar MRI.  Patient informed that this showed some minor bulging disc and also some osteoarthritis changes.  Does not look like anything surgical but I recommend that we still get her to a spine specialist to confirm and then after that we can refer to pain management.  I also recommend referral to to physical therapy.  Referral will be submitted.  All questions were answered.  Patient is agreeable to plan.

## 2024-01-02 NOTE — Progress Notes (Signed)
 [facility] - Clinic Note  01/04/2024     CHIEF COMPLAINT Patient presents for Retina Follow Up   HISTORY OF PRESENT ILLNESS: Kimberly Frazier is a 63 y.o. female who presents to the clinic today for:   HPI     Retina Follow Up   Patient presents with  Diabetic Retinopathy.  In both eyes.  This started 9 weeks ago.  I, the attending physician,  performed the HPI with the patient and updated documentation appropriately.        Comments   Patient here for 7 weeks (9 weeks) retina follow up for NPDR OU. Patient states vision is alright. A little blurry because cx last appointment stretched it out. Drops burn eyes.       Last edited by Alpha Chouinard, MD on 01/04/2024  8:39 PM.    Patient late to f/u 9 weeks instead of 7 weeks. States vision seems the same OU.  Referring physician: [name], MD [address] [place],  [place] [address]  HISTORICAL INFORMATION:   Selected notes from the MEDICAL RECORD NUMBER Referred by Dr. Oneil Platts for concern of CME    CURRENT MEDICATIONS: Current Outpatient Medications (Ophthalmic Drugs)  Medication Sig   brimonidine  (ALPHAGAN ) 0.15 % ophthalmic solution Place 1 drop into both eyes 2 (two) times daily.   brimonidine  (ALPHAGAN ) 0.2 % ophthalmic solution Place 1 drop into both eyes 2 (two) times daily.   dorzolamide -timolol  (COSOPT ) 2-0.5 % ophthalmic solution Place 1 drop into both eyes 2 (two) times daily.   ketorolac  (ACULAR ) 0.5 % ophthalmic solution Place 1 drop into both eyes 4 (four) times daily.   prednisoLONE  acetate (PRED FORTE ) 1 % ophthalmic suspension Place 1 drop into both eyes daily.   No current facility-administered medications for this visit. (Ophthalmic Drugs)   Current Outpatient Medications (Other)  Medication Sig   Accu-Chek Softclix Lancets lancets Use to check blood sugar 3 times daily.   amLODipine  (NORVASC ) 10 MG tablet Take 1 tablet (10 mg total) by mouth daily.   aspirin   EC 81 MG tablet Take 1 tablet (81 mg total) daily by mouth.   atorvastatin  (LIPITOR) 80 MG tablet Take 1 tablet (80 mg total) by mouth once daily. (PLEASE SCHEDULE AN APPT FOR FUTURE REFILLS)   Blood Glucose Monitoring Suppl (ACCU-CHEK GUIDE) w/Device KIT 1 kit by Does not apply route in the morning, at noon, and at bedtime. Use to check blood sugar 3 times daily.   carvedilol  (COREG ) 25 MG tablet Take 1 tablet (25 mg total) by mouth 2 (two) times daily with a meal.   Continuous Blood Gluc Receiver (FREESTYLE LIBRE 2 READER) DEVI Use as directed   Continuous Blood Gluc Sensor (FREESTYLE LIBRE SENSOR SYSTEM) MISC Change sensor every 2 weeks   diclofenac  Sodium (VOLTAREN ) 1 % GEL Apply 2 g topically 2 (two) times daily as needed.   glimepiride  (AMARYL ) 2 MG tablet Take 1 tablet (2 mg total) by mouth daily before breakfast.   glucose blood (ACCU-CHEK GUIDE) test strip Use to check blood sugar 3 times daily.   insulin  glargine (LANTUS  SOLOSTAR) 100 UNIT/ML Solostar Pen Inject 64 Units into the skin daily.   insulin  lispro (HUMALOG  KWIKPEN) 100 UNIT/ML KwikPen Inject 28 Units into the skin in the morning and at bedtime. Increase to 34 units into the skin twice daily after 1 week if blood sugars remain above 200.   Insulin  Pen Needle (BD PEN NEEDLE NANO U/F) 32G X  4 MM MISC Use as directed.   isosorbide  mononitrate (IMDUR ) 60 MG 24 hr tablet Take 1 tablet (60 mg total) by mouth daily.   methocarbamol  (ROBAXIN ) 500 MG tablet Take 1 tablet (500 mg total) by mouth 2 (two) times daily as needed (low back pain).   Na Sulfate-K Sulfate-Mg Sulf 17.5-3.13-1.6 GM/177ML SOLN Suprep (no substitutions)-TAKE AS DIRECTED.   nitroGLYCERIN  (NITROSTAT ) 0.4 MG SL tablet Place 1 tab under tongue for chest pain.  May repeat after 5 minutes x 2.  DO NOT TAKE MORE THAN 3 TABS DURING AN EPISODE OF CHEST PAIN   omeprazole  (PRILOSEC) 20 MG capsule Take 1 capsule (20 mg total) by mouth daily.   potassium chloride  (KLOR-CON ) 10 MEQ  tablet Take 2 tablets (20 mEq total) by mouth daily.   ezetimibe  (ZETIA ) 10 MG tablet Take 1 tablet (10 mg total) by mouth daily.   No current facility-administered medications for this visit. (Other)   REVIEW OF SYSTEMS: ROS   Positive for: Endocrine, Cardiovascular, Eyes Negative for: Constitutional, Gastrointestinal, Neurological, Skin, Genitourinary, Musculoskeletal, HENT, Respiratory, Psychiatric, Allergic/Imm, Heme/Lymph Last edited by Orval Asberry RAMAN, COA on 01/04/2024  2:42 PM.     ALLERGIES Allergies  Allergen Reactions   Jardiance  [Empagliflozin ] Other (See Comments)    Frequent yeast infection   Metformin  And Related Diarrhea   PAST MEDICAL HISTORY Past Medical History:  Diagnosis Date   Adrenal adenoma, left    Arthritis    Cancer (HCC)    CKD (chronic kidney disease), stage II    Coronary artery disease 07-28-2018 followed [facility]  currently due to no insurance   per cardiac cath 02-06-2015 (positive mild lateral ishcemia on stress test)--- dLAD 80%,  mLAD 40%,  mPDA 80% (small vessel),  ostial D1 70%,  CFx with lumial irregarlities-- medical management   Depression    Diabetic neuropathy (HCC)    Diabetic retinopathy (HCC)    NPDR OU   Headache    History of Bell's palsy 07/2011   per pt residual facial pain on left side occasionally   History of cancer of vagina 1999   per pt completed radiation and chemo   History of sepsis 12/30/2017   positive blood culter fro E.coli   Hyperlipidemia    Hypertension    Hypertensive retinopathy    OU   Hypokalemia    Insulin  dependent type 2 diabetes mellitus, uncontrolled    followed by pcp---  A1c was 11.7 on 06-15-2018 in epic   Myocardial infarction [facility])    10 yrs ago?   Nocturia    Peripheral neuropathy    PMB (postmenopausal bleeding)    Wears dentures    upper   Wears glasses    Past Surgical History:  Procedure Laterality Date   BREAST BIOPSY  2012   benign   CARDIAC  CATHETERIZATION N/A 02/06/2015   Procedure: Left Heart Cath and Coronary Angiography;  Surgeon: Ezra RAMAN Shuck, MD;  Location: [facility] INVASIVE CV LAB;  Service: Cardiovascular;  Laterality: N/A;   CATARACT EXTRACTION Bilateral OD: 03/07/19, OS: 02/28/19   Dr. Roz   CATARACT EXTRACTION, BILATERAL     COLONOSCOPY     normal exam in [place], [place] abour 10-12 years ago   EYE SURGERY Bilateral OD: 03/07/19, OS: 02/28/19   Cat Sx - Dr. Roz   HYSTEROSCOPY WITH D & C N/A 08/01/2018   Procedure: DILATATION AND CURETTAGE /HYSTEROSCOPY;  Surgeon: Alger Gong, MD;  Location: [facility];  Service: Gynecology;  Laterality: N/A;   ROBOTIC ASSISTED TOTAL HYSTERECTOMY WITH BILATERAL SALPINGO OOPHERECTOMY N/A 11/28/2018   Procedure: XI ROBOTIC ASSISTED TOTAL HYSTERECTOMY WITH BILATERAL SALPINGO OOPHORECTOMY;  Surgeon: Eloy Herring, MD;  Location: [facility];  Service: Gynecology;  Laterality: N/A;   SENTINEL NODE BIOPSY N/A 11/28/2018   Procedure: SENTINEL NODE BIOPSY;  Surgeon: Eloy Herring, MD;  Location: [facility];  Service: Gynecology;  Laterality: N/A;   UMBILICAL HERNIA REPAIR  child   FAMILY HISTORY Family History  Problem Relation Age of Onset   Heart disease Mother    Hypertension Mother    Diabetes Mother    Cancer Father        hodgkins lymphoma   Heart disease Sister        heart attack   Diabetes Sister    Colon cancer Brother 37   Diabetes Maternal Aunt    Diabetes Maternal Uncle    Cancer Other        parent   Diabetes Other        parent   Heart disease Other        parent   Hyperlipidemia Other        parent   Hypertension Other        parent   Arthritis Other        parent   Breast cancer Neg Hx    Colon polyps Neg Hx    Esophageal cancer Neg Hx    Rectal cancer Neg Hx    Stomach cancer Neg Hx    SOCIAL HISTORY Social History   Tobacco Use   Smoking status: Never   Smokeless tobacco: Never  Vaping Use   Vaping status: Never Used  Substance Use  Topics   Alcohol  use: Not Currently   Drug use: Not Currently    Types: Marijuana    Comment: every 2 weeks per pt       OPHTHALMIC EXAM:  Base Eye Exam     Visual Acuity (Snellen - Linear)       Right Left   Dist [place] 20/20 20/150 -1   Dist ph [place]  NI         Tonometry (Tonopen, 2:39 PM)       Right Left   Pressure 21 19         Pupils       Dark Light Shape React APD   Right 3 2 Round Brisk None   Left 3 2 Round Brisk None         Visual Fields (Counting fingers)       Left Right    Full Full         Extraocular Movement       Right Left    Full, Ortho Full, Ortho         Neuro/Psych     Oriented x3: Yes   Mood/Affect: Normal         Dilation     Both eyes: 1.0% Mydriacyl, 2.5% Phenylephrine  @ 2:39 PM           Slit Lamp and Fundus Exam     Slit Lamp Exam       Right Left   Lids/Lashes Dermatochalasis - upper lid Dermatochalasis - upper lid   Conjunctiva/Sclera Mild Melanosis Mild Melanosis   Cornea 1+ Punctate epithelial erosions 2+ central punctate epithelial erosions, decreased TBUT, mild tear film debris, well healed temporal cataract wounds   Anterior Chamber Deep, 0.5+cell/pigment Deep, 0.5+cell/pigment   Iris  Round and dilated, No NVI Round and dilated, No NVI   Lens PC IOL in good position with trace PCO PC IOL in good position with 1+PCO   Anterior Vitreous Vitreous syneresis, Posterior vitreous detachment Vitreous syneresis, Posterior vitreous detachment, vitreous condensations         Fundus Exam       Right Left   Disc Pink and Sharp Compact, Pink and Sharp, temporal PPA   C/D Ratio 0.1 0.1   Macula Good foveal reflex, persistent cystic changes / edema inferior macula, minimal Microaneurysms Blunted foveal reflex, central atrophy, +exudates/MA/IRH -- greatest temporal macula -- stably improved, trace cystic changes inferior macula -- slightly improved, no heme   Vessels attenuated, Tortuous attenuated, Tortuous    Periphery Attached, scattered drusen, rare scattered DBH, reticular degeneration Attached, scattered drusen, scattered DBH -- greatest nasal to disc, reticular degeneration           IMAGING AND PROCEDURES  Imaging and Procedures for 01/04/2024  OCT, Retina - OU - Both Eyes       Right Eye Quality was good. Central Foveal Thickness: 263. Progression has worsened. Findings include no SRF, abnormal foveal contour, intraretinal hyper-reflective material, intraretinal fluid, vitreomacular adhesion (Persistent IRF/cystic changes inferior macula and fovea--slightly increased).   Left Eye Quality was good. Central Foveal Thickness: 151. Progression has been stable. Findings include normal foveal contour, no SRF, retinal drusen , subretinal hyper-reflective material, intraretinal hyper-reflective material, intraretinal fluid, outer retinal atrophy, vitreomacular adhesion (Persistent IRF/IRHM inferior and temporal mac and fovea -- slightly improved, +central ORA).   Notes *Images captured and stored on drive  Diagnosis / Impression:  Macular edema OU - likely combination of CME + DME OU OD: Persistent IRF/cystic changes inferior macula and fovea---slightly increased OS: Persistent IRF/IRHM inferior and temporal mac and fovea -- slightly improved, +central ORA  Clinical management:  See below  Abbreviations: NFP - Normal foveal profile. CME - cystoid macular edema. PED - pigment epithelial detachment. IRF - intraretinal fluid. SRF - subretinal fluid. EZ - ellipsoid zone. ERM - epiretinal membrane. ORA - outer retinal atrophy. ORT - outer retinal tubulation. SRHM - subretinal hyper-reflective material      Intravitreal Injection, Pharmacologic Agent - OD - Right Eye       Time Out 01/04/2024. 4:12 PM. Confirmed correct patient, procedure, site, and patient consented.   Anesthesia Topical anesthesia was used. Anesthetic medications included Lidocaine  2%, Proparacaine 0.5%.    Procedure Preparation included 5% betadine to ocular surface, eyelid speculum. A supplied (32g) needle was used.   Injection: 1.25 mg Bevacizumab  1.25mg /0.40ml   Route: Intravitreal, Site: Right Eye   NDC: C2662926, Lot: [id number], Expiration date: 01/23/2024   Post-op Post injection exam found visual acuity of at least counting fingers. The patient tolerated the procedure well. There were no complications. The patient received written and verbal post procedure care education.      Intravitreal Injection, Pharmacologic Agent - OS - Left Eye       Time Out 01/04/2024. 4:13 PM. Confirmed correct patient, procedure, site, and patient consented.   Anesthesia Topical anesthesia was used. Anesthetic medications included Lidocaine  2%, Proparacaine 0.5%.   Procedure Preparation included 5% betadine to ocular surface, eyelid speculum. A (32g) needle was used.   Injection: 6 mg faricimab -svoa 6 MG/0.05ML (Patient supplied)   Route: Intravitreal, Site: Left Eye   NDC: 49757-903-98, Lot: [id number], Expiration date: 02/27/2026, Waste: 0 mL   Post-op Post injection exam found visual acuity of at least counting fingers. The  patient tolerated the procedure well. There were no complications. The patient received written and verbal post procedure care education. Post injection medications were not given.   Notes **SAMPLE MEDICATION ADMINISTERED**           ASSESSMENT/PLAN:    ICD-10-CM   1. Moderate nonproliferative diabetic retinopathy of both eyes with macular edema associated with type 2 diabetes mellitus (HCC)  E11.3313 OCT, Retina - OU - Both Eyes    Intravitreal Injection, Pharmacologic Agent - OD - Right Eye    Intravitreal Injection, Pharmacologic Agent - OS - Left Eye    Bevacizumab  (AVASTIN ) SOLN 1.25 mg    faricimab -svoa (VABYSMO ) 6mg /0.50mL intravitreal injection    2. Current use of insulin  (HCC)  Z79.4     3. Long-term (current) use of injectable non-insulin  antidiabetic  drugs  Z79.85     4. CME (cystoid macular edema), bilateral  H35.353     5. Essential hypertension  I10     6. Hypertensive retinopathy of both eyes  H35.033     7. Pseudophakia of both eyes  Z96.1     8. 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 Dry eyes, bilateral  H04.123      1-4. Moderate Non-proliferative diabetic retinopathy, OU OD with combination DME/CME  - last A1c 8.7 (11.12.24), 8.2 (03.08.24); 9.6 (08.07.23), 10.1 (1.31.23)  - pt has stopped Ozempic  recently due to change in vision - h/o delayed to follow up from September 2021 to April 2022--7 mos instead of 4 wks**             - exam shows scattered IRH, edema and tortuous blood vessels OU - FA (10.23.20) shows leaking MA OU and paracentral petaloid hyperfluorecence OD --> CME/[place] - s/p IVA OD #1 (11.06.20), #2 (12.18.20), #3 (02.24.21), #4 (03.24.21), #5 (04.21.21), #6 (5.26.21), #7 (07.26.21), #8 (08.25.21), #9 (09.23.21), #10 (04.26.22), #11 (01.08.25), #12 (02.26.25), #13 (04.16.25), #14 (06.04.25) - s/p IVA OS #1 (10.23.20), #2 (11.18.20), #3 (12.18.20), #4 (02.24.21), #5 (03.24.21) -- IVA resistance  ============================= - s/p IVE OD #1 (05.25.22), #2 (07.01.22), #3 (07.29.22), #4 (08.29.22 - JDM), #5 (09.26.22), #6 (10.24.22), #7 (11.21.22), #8 (12.19.22), #9 (01.23.23), #10 (02.20.23), #11 (03.22.23), #12 (04.19.23), #13 (05.17.23), #14 (06.14.23), #15 (08.04.23), #16 (09.01.23), #17 (10.10.23), #18 (11.14.23), #19 (12.19.23), #20 (02.13.24), #21 (03.20.24), #22 (03.20.24), #23 (03.20.24), #24 (04.24.24) #25(06.28.24), #26 (08.16.24), #27 (10.02.24), #28 (11.20.24) - s/p IVE OS #1 (04.21.21), #2 (5.26.21), #3 (07.26.21), #4 (08.25.21), #5 (09.23.21), #6 (04.26.22), #7 (05.25.22), #8 (07.01.22), #9 (07.29.22), #10 (08.29.22 - JDM), #11 (09.26.22), #12 (10.24.22) -- IVE resistance ============================= - s/p IVV OS #1 (11.21.22, sample), #2 (12.19.22), #3 (01.23.23), #4 (02.20.23), #5  (03.22.23), #6 (04.19.23), #7 (05.17.23), #8 (06.14.23, sample), #9 (08.04.23), #10 (9.01.23), #11 (10.10.23), #12 (11.14.23), #13 (12.19.23), #14 (02.13.24), #15 (03.20.24) #16(3.20.24), #17 (03.20.24), #18 (04.24.24) #19(06.28.24), #20 (08.16.24), #21 (10.02.24), #22 (11.20.24), #23 (01.08.25 -- SAMPLE), #24 (02.26.25 -- SAMPLE), #25 (04.16.25 - SAMPLE), #26 (06.04.25--SAMPLE) - BCVA OD 20/20 -- stable; OS stable at 20/150  - OCT today: OD: Persistent IRF/cystic changes inferior macula and fovea--slightly increased; OS: Persistent IRF/IRHM inferior and temporal mac and fovea -- slightly improved, +central ORA at 9 weeks - recommend IVA OD #15 and IVV OS #27 [sample] today, 08.06.25 with follow up dec to 7 weeks - [employer] funding unavailable at this time -- pt wishes to proceed w/ injections             - RBA of procedure discussed, questions  answered - Vabysmo  OS consent form signed and scanned on 11.20.24 - Avastin  OD informed consent signed and scanned on 01.08.25 - see procedure note             - continue Ketorolac  QID OU             - f/u in 7 weeks, DFE, OCT, possible injection OU   5,6. Hypertensive retinopathy OU             - discussed importance of tight BP control             - continue to monitor   7. Pseudophakia OU            - s/p CE/IOL OU [place]), OD: 03/07/2019, OS: 02/28/2019             - IOLs in good position             - CME OU as above             - continue to monitor   8. Ocular Hypertension OU             - IOP: 21, 19 - h/o steroid response             - cont Cosopt  BID OU and Brimonidine  BID OU  9. Dry eyes OU - recommend artificial tears and lubricating ointment as needed   Ophthalmic Meds Ordered this visit:  Meds ordered this encounter  Medications   Bevacizumab  (AVASTIN ) SOLN 1.25 mg   faricimab -svoa (VABYSMO ) 6mg /0.37mL intravitreal injection     Return in about 7 weeks (around 02/22/2024) for +DME OU - DFE, OCT, Possible Injxn.  There are  no Patient Instructions on file for this visit.  This document serves as a record of services personally performed by Redell JUDITHANN Hans, MD, PhD. It was created on their behalf by Almetta Pesa, an ophthalmic technician. The creation of this record is the provider's dictation and/or activities during the visit.    Electronically signed by: Almetta Pesa, OA, 01/08/24  2:49 AM  This document serves as a record of services personally performed by Redell JUDITHANN Hans, MD, PhD. It was created on their behalf by Auston Muzzy, COMT. The creation of this record is the provider's dictation and/or activities during the visit.  Electronically signed by: Auston Muzzy, COMT 01/08/24 2:49 AM  Redell JUDITHANN Hans, M.D., Ph.D. Diseases & Surgery of the Retina and Vitreous [facility]  I have reviewed the above documentation for accuracy and completeness, and I agree with the above. Redell JUDITHANN Hans, M.D., Ph.D. 01/08/24 2:51 AM   Abbreviations: M myopia (nearsighted); A astigmatism; H hyperopia (farsighted); P presbyopia; Mrx spectacle prescription;  CTL contact lenses; OD right eye; OS left eye; OU both eyes  XT exotropia; ET esotropia; PEK punctate epithelial keratitis; PEE punctate epithelial erosions; DES dry eye syndrome; MGD meibomian gland dysfunction; ATs artificial tears; PFAT's preservative free artificial tears; NSC nuclear sclerotic cataract; PSC posterior subcapsular cataract; ERM epi-retinal membrane; PVD posterior vitreous detachment; RD retinal detachment; DM diabetes mellitus; DR diabetic retinopathy; NPDR non-proliferative diabetic retinopathy; PDR proliferative diabetic retinopathy; CSME clinically significant macular edema; DME diabetic macular edema; dbh dot blot hemorrhages; CWS cotton wool spot; POAG primary open angle glaucoma; C/D cup-to-disc ratio; HVF humphrey visual field; GVF goldmann visual field; OCT optical coherence tomography; IOP intraocular pressure; BRVO  Branch retinal vein occlusion; CRVO central retinal vein occlusion; CRAO central retinal artery occlusion; BRAO branch  retinal artery occlusion; RT retinal tear; SB scleral buckle; PPV pars plana vitrectomy; VH Vitreous hemorrhage; PRP panretinal laser photocoagulation; IVK intravitreal kenalog; VMT vitreomacular traction; MH Macular hole;  NVD neovascularization of the disc; NVE neovascularization elsewhere; AREDS age related eye disease study; ARMD age related macular degeneration; POAG primary open angle glaucoma; EBMD epithelial/anterior basement membrane dystrophy; ACIOL anterior chamber intraocular lens; IOL intraocular lens; PCIOL posterior chamber intraocular lens; Phaco/IOL phacoemulsification with intraocular lens placement; PRK photorefractive keratectomy; LASIK laser assisted in situ keratomileusis; HTN hypertension; DM diabetes mellitus; COPD chronic obstructive pulmonary disease

## 2024-01-03 ENCOUNTER — Telehealth: Payer: Self-pay | Admitting: Internal Medicine

## 2024-01-03 NOTE — Telephone Encounter (Signed)
 Confirmed appt for 8/7

## 2024-01-04 ENCOUNTER — Encounter (INDEPENDENT_AMBULATORY_CARE_PROVIDER_SITE_OTHER): Payer: Self-pay | Admitting: Ophthalmology

## 2024-01-04 ENCOUNTER — Ambulatory Visit (INDEPENDENT_AMBULATORY_CARE_PROVIDER_SITE_OTHER): Admitting: Ophthalmology

## 2024-01-04 DIAGNOSIS — Z794 Long term (current) use of insulin: Secondary | ICD-10-CM | POA: Diagnosis not present

## 2024-01-04 DIAGNOSIS — H35353 Cystoid macular degeneration, bilateral: Secondary | ICD-10-CM

## 2024-01-04 DIAGNOSIS — I10 Essential (primary) hypertension: Secondary | ICD-10-CM

## 2024-01-04 DIAGNOSIS — H40053 Ocular hypertension, bilateral: Secondary | ICD-10-CM | POA: Diagnosis not present

## 2024-01-04 DIAGNOSIS — H04123 Dry eye syndrome of bilateral lacrimal glands: Secondary | ICD-10-CM

## 2024-01-04 DIAGNOSIS — Z7985 Long-term (current) use of injectable non-insulin antidiabetic drugs: Secondary | ICD-10-CM | POA: Diagnosis not present

## 2024-01-04 DIAGNOSIS — Z961 Presence of intraocular lens: Secondary | ICD-10-CM

## 2024-01-04 DIAGNOSIS — E113313 Type 2 diabetes mellitus with moderate nonproliferative diabetic retinopathy with macular edema, bilateral: Secondary | ICD-10-CM

## 2024-01-04 DIAGNOSIS — H35033 Hypertensive retinopathy, bilateral: Secondary | ICD-10-CM

## 2024-01-04 MED ORDER — FARICIMAB-SVOA 6 MG/0.05ML IZ SOLN
6.0000 mg | INTRAVITREAL | Status: AC | PRN
  Administered 2024-01-04: 6 mg via INTRAVITREAL

## 2024-01-04 MED ORDER — BEVACIZUMAB CHEMO INJECTION 1.25MG/0.05ML SYRINGE FOR KALEIDOSCOPE
1.2500 mg | INTRAVITREAL | Status: AC | PRN
  Administered 2024-01-04: 1.25 mg via INTRAVITREAL

## 2024-01-06 ENCOUNTER — Ambulatory Visit: Admitting: Pharmacist

## 2024-01-09 ENCOUNTER — Other Ambulatory Visit (HOSPITAL_COMMUNITY): Payer: Self-pay

## 2024-01-11 ENCOUNTER — Other Ambulatory Visit (HOSPITAL_COMMUNITY): Payer: Self-pay

## 2024-01-11 ENCOUNTER — Other Ambulatory Visit: Payer: Self-pay

## 2024-01-12 ENCOUNTER — Other Ambulatory Visit: Payer: Self-pay | Admitting: Cardiology

## 2024-01-12 ENCOUNTER — Other Ambulatory Visit: Payer: Self-pay

## 2024-01-13 ENCOUNTER — Other Ambulatory Visit: Payer: Self-pay

## 2024-01-13 MED ORDER — EZETIMIBE 10 MG PO TABS
10.0000 mg | ORAL_TABLET | Freq: Every day | ORAL | 0 refills | Status: DC
  Filled 2024-01-13: qty 30, 30d supply, fill #0

## 2024-01-16 NOTE — Progress Notes (Signed)
 Referring Physician:  [name], MD [address] [place],  [place] [address]  Primary Physician:  [name], MD  History of Present Illness: 01/23/2024 [name] is here today with a chief complaint of longstanding low back pain primarily on her left side that intermittently radiates to her left leg.  She states that it increases when sitting or standing for prolonged period of time.  It primarily stays right in her left hip area.  She adds that at times she does have some weakness in her knees but no falls.  She does think her weight gain over the past 4 years (approximately 70 pounds) has contributed to her back pain.  Has been taking Tylenol .  Of note over the past 6 months she has had new incontinence to bowel.  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  This recurred approximately 5 years ago in which she then had a hysterectomy.  She has not had follow-up recently.  She denies any abdominal pain at this time, but the urinary incontinence has continued and when she has previously seen urology and as stated the fecal incontinence is new over the past 6 months .  She denies any numbness in her private area.  There is a previous concern for this ago vaginal fistula which was unfounded about 5 years ago.  She is due to start physical therapy tomorrow.    Duration:worse in 2024 at least Weakness: none Bowel/Bladder Dysfunction: bowel and bladder incontinence in the last year or so  Conservative measures:  Physical therapy: has not participated in but starting on 01/24/2024 Multimodal medical therapy including regular antiinflammatories:  Voltaren  gel, Tylenol  and methocarbamol   Injections:  no epidural steroid injections  Past Surgery: no spine surgeries  Sherman Lipuma has no symptoms of cervical myelopathy.  The symptoms are causing a significant impact on the patient's life.   Review of Systems:  A 10 point  review of systems is negative, except for the pertinent positives and negatives detailed in the HPI.  Past Medical History: Past Medical History:  Diagnosis Date   Adrenal adenoma, left    Arthritis    Cancer (HCC)    CKD (chronic kidney disease), stage II    Coronary artery disease 07-28-2018 followed [facility]  currently due to no insurance   per cardiac cath 02-06-2015 (positive mild lateral ishcemia on stress test)--- dLAD 80%,  mLAD 40%,  mPDA 80% (small vessel),  ostial D1 70%,  CFx with lumial irregarlities-- medical management   Depression    Diabetic neuropathy (HCC)    Diabetic retinopathy (HCC)    NPDR OU   Headache    History of Bell's palsy 07/2011   per pt residual facial pain on left side occasionally   History of cancer of vagina 1999   per pt completed radiation and chemo   History of sepsis 12/30/2017   positive blood culter fro E.coli   Hyperlipidemia    Hypertension    Hypertensive retinopathy    OU   Hypokalemia    Insulin  dependent type 2 diabetes mellitus, uncontrolled    followed by pcp---  A1c was 11.7 on 06-15-2018 in epic   Myocardial infarction [facility])    10 yrs ago?   Nocturia    Peripheral neuropathy    PMB (postmenopausal bleeding)    Wears dentures    upper   Wears glasses     Past  Surgical History: Past Surgical History:  Procedure Laterality Date   BREAST BIOPSY  2012   benign   CARDIAC CATHETERIZATION N/A 02/06/2015   Procedure: Left Heart Cath and Coronary Angiography;  Surgeon: Ezra GORMAN Shuck, MD;  Location: [facility] INVASIVE CV LAB;  Service: Cardiovascular;  Laterality: N/A;   CATARACT EXTRACTION Bilateral OD: 03/07/19, OS: 02/28/19   Dr. Roz   CATARACT EXTRACTION, BILATERAL     COLONOSCOPY     normal exam in [place], [place] abour 10-12 years ago   EYE SURGERY Bilateral OD: 03/07/19, OS: 02/28/19   Cat Sx - Dr. Roz   HYSTEROSCOPY WITH D & C N/A 08/01/2018   Procedure: DILATATION AND CURETTAGE /HYSTEROSCOPY;   Surgeon: Alger Gong, MD;  Location: [facility];  Service: Gynecology;  Laterality: N/A;   ROBOTIC ASSISTED TOTAL HYSTERECTOMY WITH BILATERAL SALPINGO OOPHERECTOMY N/A 11/28/2018   Procedure: XI ROBOTIC ASSISTED TOTAL HYSTERECTOMY WITH BILATERAL SALPINGO OOPHORECTOMY;  Surgeon: Eloy Herring, MD;  Location: [facility];  Service: Gynecology;  Laterality: N/A;   SENTINEL NODE BIOPSY N/A 11/28/2018   Procedure: SENTINEL NODE BIOPSY;  Surgeon: Eloy Herring, MD;  Location: [facility];  Service: Gynecology;  Laterality: N/A;   UMBILICAL HERNIA REPAIR  child    Allergies: Allergies as of 01/23/2024 - Review Complete 01/23/2024  Allergen Reaction Noted   Jardiance  [empagliflozin ] Other (See Comments) 06/30/2021   Metformin  and related Diarrhea 12/07/2023    Medications: Outpatient Encounter Medications as of 01/23/2024  Medication Sig   Accu-Chek Softclix Lancets lancets Use to check blood sugar 3 times daily.   amLODipine  (NORVASC ) 10 MG tablet Take 1 tablet (10 mg total) by mouth daily.   aspirin  EC 81 MG tablet Take 1 tablet (81 mg total) daily by mouth.   atorvastatin  (LIPITOR) 80 MG tablet Take 1 tablet (80 mg total) by mouth once daily. (PLEASE SCHEDULE AN APPT FOR FUTURE REFILLS)   Blood Glucose Monitoring Suppl (ACCU-CHEK GUIDE) w/Device KIT 1 kit by Does not apply route in the morning, at noon, and at bedtime. Use to check blood sugar 3 times daily.   brimonidine  (ALPHAGAN ) 0.15 % ophthalmic solution Place 1 drop into both eyes 2 (two) times daily.   brimonidine  (ALPHAGAN ) 0.2 % ophthalmic solution Place 1 drop into both eyes 2 (two) times daily.   carvedilol  (COREG ) 25 MG tablet Take 1 tablet (25 mg total) by mouth 2 (two) times daily with a meal.   Continuous Blood Gluc Receiver (FREESTYLE LIBRE 2 READER) DEVI Use as directed   Continuous Blood Gluc Sensor (FREESTYLE LIBRE SENSOR SYSTEM) MISC Change sensor every 2 weeks   diclofenac  Sodium (VOLTAREN ) 1 % GEL Apply 2 g topically  2 (two) times daily as needed.   dorzolamide -timolol  (COSOPT ) 2-0.5 % ophthalmic solution Place 1 drop into both eyes 2 (two) times daily.   ezetimibe  (ZETIA ) 10 MG tablet Take 1 tablet (10 mg total) by mouth daily. PATIENT MUST CALL AND SCHEDULE OVERDUE ANNUAL APPOINTMENT FOR FURTHER REFILLS.FIRST ATTEMPT   glimepiride  (AMARYL ) 2 MG tablet Take 1 tablet (2 mg total) by mouth daily before breakfast.   glucose blood (ACCU-CHEK GUIDE) test strip Use to check blood sugar 3 times daily.   insulin  glargine (LANTUS  SOLOSTAR) 100 UNIT/ML Solostar Pen Inject 64 Units into the skin daily.   insulin  lispro (HUMALOG  KWIKPEN) 100 UNIT/ML KwikPen Inject 28 Units into the skin in the morning and at bedtime. Increase to 34 units into the skin twice daily after 1 week if blood sugars remain above  200.   Insulin  Pen Needle (BD PEN NEEDLE NANO U/F) 32G X 4 MM MISC Use as directed.   isosorbide  mononitrate (IMDUR ) 60 MG 24 hr tablet Take 1 tablet (60 mg total) by mouth daily.   ketorolac  (ACULAR ) 0.5 % ophthalmic solution Place 1 drop into both eyes 4 (four) times daily.   methocarbamol  (ROBAXIN ) 500 MG tablet Take 1 tablet (500 mg total) by mouth 2 (two) times daily as needed (low back pain).   nitroGLYCERIN  (NITROSTAT ) 0.4 MG SL tablet Place 1 tab under tongue for chest pain.  May repeat after 5 minutes x 2.  DO NOT TAKE MORE THAN 3 TABS DURING AN EPISODE OF CHEST PAIN   omeprazole  (PRILOSEC) 20 MG capsule Take 1 capsule (20 mg total) by mouth daily.   potassium chloride  (KLOR-CON ) 10 MEQ tablet Take 2 tablets (20 mEq total) by mouth daily.   prednisoLONE  acetate (PRED FORTE ) 1 % ophthalmic suspension Place 1 drop into both eyes daily.   [DISCONTINUED] Na Sulfate-K Sulfate-Mg Sulf 17.5-3.13-1.6 GM/177ML SOLN Suprep (no substitutions)-TAKE AS DIRECTED.   No facility-administered encounter medications on file as of 01/23/2024.    Social History: Social History   Tobacco Use   Smoking status: Never   Smokeless  tobacco: Never  Vaping Use   Vaping status: Never Used  Substance Use Topics   Alcohol  use: Not Currently   Drug use: Not Currently    Family Medical History: Family History  Problem Relation Age of Onset   Heart disease Mother    Hypertension Mother    Diabetes Mother    Cancer Father        hodgkins lymphoma   Heart disease Sister        heart attack   Diabetes Sister    Colon cancer Brother 71   Diabetes Maternal Aunt    Diabetes Maternal Uncle    Cancer Other        parent   Diabetes Other        parent   Heart disease Other        parent   Hyperlipidemia Other        parent   Hypertension Other        parent   Arthritis Other        parent   Breast cancer Neg Hx    Colon polyps Neg Hx    Esophageal cancer Neg Hx    Rectal cancer Neg Hx    Stomach cancer Neg Hx     Physical Examination: @VITALWITHPAIN @  General: Patient is well developed, well nourished, calm, collected, and in no apparent distress. Attention to examination is appropriate.  Psychiatric: Patient is non-anxious.  Head:  Pupils equal, round, and reactive to light.  ENT:  Oral mucosa appears well hydrated.  Neck:   Supple.  Full range of motion.  Respiratory: Patient is breathing without any difficulty.  Extremities: No edema.  Vascular: Palpable dorsal pedal pulses.  Skin:   On exposed skin, there are no abnormal skin lesions.  NEUROLOGICAL:     Awake, alert, oriented to person, place, and time.  Speech is clear and fluent. Fund of knowledge is appropriate.   Cranial Nerves: Pupils equal round and reactive to light.  Facial tone is symmetric.   ROM of spine: Minimal tenderness of patient lumbar paraspinals.  Strength:  Side Iliopsoas Quads Hamstring PF DF EHL  R 5 5 5 5 5 5   L 5 5 5 5 5 5    Difficult  to obtain reflexes secondary to body habitus. Clonus is not present.  Toes are down-going.  Bilateral upper and lower extremity sensation is intact to light touch.    Gait is  normal.   No difficulty with tandem gait.   No evidence of dysmetria noted.  Medical Decision Making  Imaging: MRI Lumbar spine 12/25/23:  EXAM: MRI LUMBAR SPINE WITHOUT CONTRAST   TECHNIQUE: Multiplanar, multisequence MR imaging of the lumbar spine was performed. No intravenous contrast was administered.   COMPARISON:  Radiography 04/22/2023   FINDINGS: Segmentation:  5 lumbar type vertebral bodies.   Alignment: Normal except for 1 mm of degenerative anterolisthesis at L4-5.   Vertebrae:  No fracture or focal bone lesion.   Conus medullaris and cauda equina: Conus extends to the L1 level. Conus and cauda equina appear normal.   Paraspinal and other soft tissues: Negative   Disc levels:   No abnormality from T11-12 through L2-3.   L3-4: Minimal disc bulge.  Mild facet osteoarthritis.  No stenosis.   L4-5: Minimal disc bulge. Bilateral facet osteoarthritis with 1 mm of degenerative anterolisthesis. No apparent compressive stenosis of the canal or foramina. No gross edematous change of the facet joints, but the facet arthritis could possibly be a cause of back pain or referred facet syndrome pain.   L5-S1: Mild bulging of the disc. Mild facet osteoarthritis. The disc bulge does encroach somewhat upon both neural foramina and it would be possible that either or both L5 nerves could be irritated.   IMPRESSION: 1. L3-4: Minimal disc bulge. Mild facet osteoarthritis. No stenosis. 2. L4-5: Bilateral facet osteoarthritis with 1 mm of degenerative anterolisthesis. No apparent compressive stenosis of the canal or foramina. The facet arthritis could be a cause of back pain or referred facet syndrome pain. 3. L5-S1: Disc bulge. Mild facet osteoarthritis. Foraminal narrowing that could possibly affect either or both L5 nerves.  I have personally reviewed the images and agree with the above interpretation.  Assessment and Plan: Ms. Chilton is a pleasant 63 y.o. female is  here today with a chief complaint of longstanding low back pain primarily on her left side that intermittently radiates to her left leg.  She states that it increases when sitting or standing for prolonged period of time.  It primarily stays right in her left hip area.  She adds that at times she does have some weakness in her knees but no falls.  She does think her weight gain over the past 4 years (approximately 70 pounds) has contributed to her back pain.  Has been taking Tylenol .Of note over the past 6 months she has had new incontinence to bowel.  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  This recurred approximately 5 years ago in which she then had a hysterectomy.  She has not had follow-up recently.  She denies any abdominal pain at this time, but the urinary incontinence has continued and when she has previously seen urology and as stated the fecal incontinence is new over the past 6 months .  She denies any numbness in her private area.  There is a previous concern for this ago vaginal fistula which was unfounded about 5 years ago.  She is due to start physical therapy tomorrow.  On examination she does have full strength.  MRI is reviewed at length which shows a disc bulge at L5-S1 as well as some facet arthropathy.  Is a pleasure to see patient in clinic today.  I am concerned of her changes to incontinence in the setting of her oncologic history.    -CT of abdomen and pelvis ordered. - X-rays of lumbar spine to be completed at next visit. - Continue physical therapy - Counseled patient on weight loss - Reiterated over-the-counter regimen including Tylenol , the use of lidocaine , heat and ice. - Tizanidine  given for muscle spasms.  The side effects of this medication were discussed at length including drowsiness. - Plan to see back in the next 6 to 8 weeks for follow-up.  Advised patient to contact me before if she experiences any change in symptoms.   Red flag symptoms reviewed. - Potential injections in the future.    Thank you for involving me in the care of this patient.   I spent a total of 45 minutes in both face-to-face and non-face-to-face activities for this visit on the date of this encounter including preparing to see the patient, obtaining and reviewing separately obtained history, performing medically appropriate examination, counseling the patient and their family, ordering additional medications and tests, documenting clinical information, independently interpreting results, coordination of care.   Lyle Decamp, PA-C Dept. of Neurosurgery

## 2024-01-17 ENCOUNTER — Other Ambulatory Visit: Payer: Self-pay

## 2024-01-19 ENCOUNTER — Other Ambulatory Visit: Payer: Self-pay

## 2024-01-20 ENCOUNTER — Ambulatory Visit: Attending: Internal Medicine | Admitting: Physical Therapy

## 2024-01-20 ENCOUNTER — Other Ambulatory Visit: Payer: Self-pay | Admitting: Cardiology

## 2024-01-20 DIAGNOSIS — I2510 Atherosclerotic heart disease of native coronary artery without angina pectoris: Secondary | ICD-10-CM

## 2024-01-20 DIAGNOSIS — R262 Difficulty in walking, not elsewhere classified: Secondary | ICD-10-CM | POA: Diagnosis not present

## 2024-01-20 DIAGNOSIS — M5459 Other low back pain: Secondary | ICD-10-CM | POA: Diagnosis not present

## 2024-01-20 DIAGNOSIS — M5416 Radiculopathy, lumbar region: Secondary | ICD-10-CM | POA: Diagnosis not present

## 2024-01-20 NOTE — Therapy (Signed)
 OUTPATIENT PHYSICAL THERAPY THORACOLUMBAR EVALUATION   Patient Name: Kimberly Frazier MRN: [id number] DOB:14-Mar-1961, 63 y.o., female Today's Date: 01/21/2024  END OF SESSION:  PT End of Session - 01/21/24 1359     Visit Number 1    Number of Visits 16    Date for PT Re-Evaluation 03/16/24    Authorization Type [employer] Dual complete    PT Start Time 1106    PT Stop Time 1145    PT Time Calculation (min) 39 min    Activity Tolerance Patient tolerated treatment well    Behavior During Therapy WFL for tasks assessed/performed          Past Medical History:  Diagnosis Date   Adrenal adenoma, left    Arthritis    Cancer (HCC)    CKD (chronic kidney disease), stage II    Coronary artery disease 07-28-2018 followed [facility]  currently due to no insurance   per cardiac cath 02-06-2015 (positive mild lateral ishcemia on stress test)--- dLAD 80%,  mLAD 40%,  mPDA 80% (small vessel),  ostial D1 70%,  CFx with lumial irregarlities-- medical management   Depression    Diabetic neuropathy (HCC)    Diabetic retinopathy (HCC)    NPDR OU   Headache    History of Bell's palsy 07/2011   per pt residual facial pain on left side occasionally   History of cancer of vagina 1999   per pt completed radiation and chemo   History of sepsis 12/30/2017   positive blood culter fro E.coli   Hyperlipidemia    Hypertension    Hypertensive retinopathy    OU   Hypokalemia    Insulin  dependent type 2 diabetes mellitus, uncontrolled    followed by pcp---  A1c was 11.7 on 06-15-2018 in epic   Myocardial infarction (HCC)    10 yrs ago?   Nocturia    Peripheral neuropathy    PMB (postmenopausal bleeding)    Wears dentures    upper   Wears glasses    Past Surgical History:  Procedure Laterality Date   BREAST BIOPSY  2012   benign   CARDIAC CATHETERIZATION N/A 02/06/2015   Procedure: Left Heart Cath and Coronary Angiography;  Surgeon: Ezra GORMAN Shuck, MD;  Location: [facility] INVASIVE CV  LAB;  Service: Cardiovascular;  Laterality: N/A;   CATARACT EXTRACTION Bilateral OD: 03/07/19, OS: 02/28/19   Dr. Roz   CATARACT EXTRACTION, BILATERAL     COLONOSCOPY     normal exam in [place], [place] abour 10-12 years ago   EYE SURGERY Bilateral OD: 03/07/19, OS: 02/28/19   Cat Sx - Dr. Roz   HYSTEROSCOPY WITH D & C N/A 08/01/2018   Procedure: DILATATION AND CURETTAGE /HYSTEROSCOPY;  Surgeon: Alger Gong, MD;  Location: [facility];  Service: Gynecology;  Laterality: N/A;   ROBOTIC ASSISTED TOTAL HYSTERECTOMY WITH BILATERAL SALPINGO OOPHERECTOMY N/A 11/28/2018   Procedure: XI ROBOTIC ASSISTED TOTAL HYSTERECTOMY WITH BILATERAL SALPINGO OOPHORECTOMY;  Surgeon: Eloy Herring, MD;  Location: [facility];  Service: Gynecology;  Laterality: N/A;   SENTINEL NODE BIOPSY N/A 11/28/2018   Procedure: SENTINEL NODE BIOPSY;  Surgeon: Eloy Herring, MD;  Location: [facility];  Service: Gynecology;  Laterality: N/A;   UMBILICAL HERNIA REPAIR  child   Patient Active Problem List   Diagnosis Date Noted   Low back pain 04/22/2023   Stage 3b chronic kidney disease (HCC) 08/06/2022   Fecal smearing 09/16/2020   Urinary incontinence, mixed 09/16/2020   Major depressive disorder,  single episode, mild (HCC) 09/16/2020   History of endometrial cancer 07/30/2020   New onset headache 12/21/2019   Cerebral vascular disease 12/21/2019   Migraine without aura and without status migrainosus, not intractable 11/12/2019   Hematuria 11/28/2018   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   History of radiation therapy 11/28/2018   Diabetes mellitus (HCC) 10/06/2018   Type 2 diabetes mellitus with hyperglycemia, with long-term current use of insulin  (HCC) 10/06/2018   Adrenal adenoma, left 06/15/2018   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   Postmenopausal vaginal bleeding 06/15/2018   Hyperkalemia    Diabetic peripheral neuropathy (HCC) 09/03/2016   CAD (coronary artery disease) 04/10/2015   Neck pain  06/20/2014   Severe obesity (BMI >= 40) (HCC) 09/20/2013   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   Hyperlipidemia 10/25/2012   Diabetes mellitus type 2, uncontrolled, with complications 07/26/2012   Essential hypertension 07/26/2012    PCP: [name] MD  REFERRING PROVIDER: [name], MD  REFERRING DIAG: M54.16 (ICD-10-CM) - Left lumbar radiculopathy  Rationale for Evaluation and Treatment: Rehabilitation  THERAPY DIAG:  Other low back pain  Difficulty in walking, not elsewhere classified  ONSET DATE: chronic  SUBJECTIVE:                                                                                                                                                                                           SUBJECTIVE STATEMENT: Patient reports to physical therapy with complaint of lower back pain extending to her left hip and thigh.  The pain has been present for years but is recently increased to the point where she spends most of the time laying in bed.  She has extreme difficulty standing even for short periods of time to do basic home tasks.  At times her knee buckles and even her left foot may catch on the rug.  Once in a while she will have leg numbness but she also is diabetic so not sure the source of that.  She avoids lifting for fear of injuring her back and can stand absolutely no longer than 20 minutes at a time.  She does not walk with a device at this time but she frequently holds onto furniture and walls to maintain stability.     PERTINENT HISTORY:  Diabetes, HTN, obesity neuropathy coronary artery disease chronic kidney disease  PAIN:  Are you having pain? Yes: NPRS scale: 9/10-10/10  Pain location: central low back , wrapping to glute  Pain description: aching, sore , burning Aggravating factors: standing, walking  Relieving factors: Tylenol , does not use heating pad or ice, sitting temporarily   PRECAUTIONS: None  RED  FLAGS: None   WEIGHT BEARING  RESTRICTIONS: No  FALLS:  Has patient fallen in last 6 months? No Has fallen before but years ago.   LIVING ENVIRONMENT: Lives with: lives with their family Lives in: House/apartment Stairs: Yes: External: 4 steps; on right going up Has following equipment at home: None  OCCUPATION: Retired , Psychologist, forensic   PLOF: Independent with basic ADLs, Independent with household mobility without device, Needs assistance with homemaking, and Leisure: limited by her back pain, is bored.  She used to go out a lot more and visit her family.   PATIENT GOALS: Pain to be manageable, how I can reduce it myself   NEXT MD VISIT: after PT   OBJECTIVE:  Note: Objective measures were completed at Evaluation unless otherwise noted.  DIAGNOSTIC FINDINGS:  MRI 12/25/23: IMPRESSION: 1. L3-4: Minimal disc bulge. Mild facet osteoarthritis. No stenosis. 2. L4-5: Bilateral facet osteoarthritis with 1 mm of degenerative anterolisthesis. No apparent compressive stenosis of the canal or foramina. The facet arthritis could be a cause of back pain or referred facet syndrome pain. 3. L5-S1: Disc bulge. Mild facet osteoarthritis. Foraminal narrowing that could possibly affect either or both L5 nerves.  PATIENT SURVEYS:  Modified Oswestry: 70% (35/50)  Interpretation of scores: Score Category Description  0-20% Minimal Disability The patient can cope with most living activities. Usually no treatment is indicated apart from advice on lifting, sitting and exercise  21-40% Moderate Disability The patient experiences more pain and difficulty with sitting, lifting and standing. Travel and social life are more difficult and they may be disabled from work. Personal care, sexual activity and sleeping are not grossly affected, and the patient can usually be managed by conservative means  41-60% Severe Disability Pain remains the main problem in this group, but activities of daily living are affected. These patients require a  detailed investigation  61-80% Crippled Back pain impinges on all aspects of the patient's life. Positive intervention is required  81-100% Bed-bound  These patients are either bed-bound or exaggerating their symptoms  Bluford FORBES Zoe DELENA Karon DELENA, et al. Surgery versus conservative management of stable thoracolumbar fracture: the PRESTO feasibility RCT. [place] ([place]): [employer]; 2021 Nov. [facility] [employer] Assessment, No. 25.62.) Appendix 3, Oswestry Disability Index category descriptors. Available from: FindJewel[employer].cz  Minimally Clinically Important Difference (MCID) = 12.8%  COGNITION: Overall cognitive status: Within functional limits for tasks assessed     SENSATION: WFL  POSTURE: flexed trunk   PALPATION: Generalized pain and soreness across the lower back bilaterally.  LUMBAR ROM:   AROM eval  Flexion Pain on L   Extension 50% Pain on L   Right lateral flexion Limited 25%   Left lateral flexion Limited 25%   Right rotation 25%  Left rotation 25%   (Blank rows = not tested)  LOWER EXTREMITY ROM:   WFL   Passive  Right eval Left eval  Hip flexion WFL WNL, but Painful  Hip extension    Hip abduction    Hip adduction    Hip internal rotation Tight  tight  Hip external rotation tight tight  Knee flexion    Knee extension    Ankle dorsiflexion    Ankle plantarflexion    Ankle inversion    Ankle eversion     (Blank rows = not tested)  LOWER EXTREMITY MMT:    MMT Right eval Left eval  Hip flexion 4 4-  Hip extension    Hip abduction    Hip adduction  Hip internal rotation    Hip external rotation    Knee flexion 5 4  Knee extension 5 4  Ankle dorsiflexion 5 5  Ankle plantarflexion 3 3  Ankle inversion    Ankle eversion     (Blank rows = not tested)  LUMBAR SPECIAL TESTS:  Straight leg raise test: Positive on L   FUNCTIONAL TESTS:  5 times sit to stand: 32 sec no UEs  Hip weakness evident with  standing marching  GAIT: Distance walked: 150 Assistive device utilized: None Level of assistance: Modified independence Comments: Uses the wall for support  TREATMENT DATE:   [facility] Adult PT Treatment:                                                DATE: 01/21/24 Therapeutic Exercise: Seated hamstring stretch Lower trunk rotation Sit to stand  Self Care: Discussed process of physical therapy, plan of care, aquatics to offload joints and improve experience of exercise,  the role of weight loss in back pain, basic home exercise program and what to expect                                                                                                                                  PATIENT EDUCATION:  Education details: Self-care see above Person educated: Patient Education method: Programmer, multimedia, Demonstration, Verbal cues, and Handouts Education comprehension: verbalized understanding and needs further education  HOME EXERCISE PROGRAM: Access Code: [email or web address] URL: [email or web address] Date: 01/20/2024 Prepared by: Delon Norma  Exercises - Seated Hamstring Stretch  - 2 x daily - 7 x weekly - 1 sets - 3-5 reps - 30 hold - Sit to Stand with Arm Reach Toward Target  - 5 x daily - 7 x weekly - 2 sets - 10 reps - 30 hold - Supine Lower Trunk Rotation  - 2 x daily - 7 x weekly - 1 sets - 10 reps - 10 hold  ASSESSMENT:  CLINICAL IMPRESSION: Patient is a 63y.o. female who was seen today for physical therapy evaluation and treatment for lower back pain with left-sided radiculopathy.  Patient reports that the pain is worsening over the past couple of months leading to very limited activity outside of her home and increased reliance on family members for basic tasks. OBJECTIVE IMPAIRMENTS: cardiopulmonary status limiting activity, decreased activity tolerance, decreased balance, decreased endurance, decreased knowledge of condition, decreased mobility, difficulty walking,  decreased ROM, decreased strength, increased fascial restrictions, impaired flexibility, improper body mechanics, obesity, and pain.   ACTIVITY LIMITATIONS: carrying, lifting, bending, sitting, standing, squatting, stairs, transfers, bed mobility, and locomotion level  PARTICIPATION LIMITATIONS: cleaning, laundry, interpersonal relationship, shopping, and community activity  PERSONAL FACTORS: Social background, Time since onset of injury/illness/exacerbation, and 3+ comorbidities: Obesity, chronic pain and metabolic dysfunction are also affecting patient's  functional outcome.   REHAB POTENTIAL: Good  CLINICAL DECISION MAKING: Evolving/moderate complexity  EVALUATION COMPLEXITY: Moderate   GOALS: Goals reviewed with patient? Yes  SHORT TERM GOALS: Target date: 02/18/2024   Patient will be able to show independence for initial HEP to include posture, core and hip strength and stability.   Baseline: Goal status: INITIAL  2.  Patient will be able to complete 2 min walk test  Baseline:  Goal status: INITIAL  3.  Patient will demonstrate transfers from the bed which do not increase her back pain  Baseline: Goal status: INITIAL   LONG TERM GOALS: Target date: 03/17/2024    Patient will be independent with final HEP upon discharge from PT and report consistent benefit following exercise completion.    Baseline:  Goal status: INITIAL  2.  Patient will be able to improve 2 min walk test distance by 50 feet or more with LRAD and no increase in pain.   Baseline:  Goal status: INITIAL  3.  Patient will be able to demonstrate hip/knee strength to 5/5 in order to maximize functional mobility, ambulation.  Baseline:  Goal status: INITIAL  4.  Patient will be able to stand for 20 minutes to do light home tasks with no more than minimal increase in lower back pain Baseline:  Goal status: INITIAL  5.  Patient will report rare instances of pain radiating into her left leg/thigh.   Baseline:  Goal status: INITIAL  6. Patient will report being able to walk in and out of the store for < 15 min with no more than min increase in back pain.  Baseline:  Goal status: INITIAL  PLAN:  PT FREQUENCY: 2x/week  PT DURATION: 8 weeks  PLANNED INTERVENTIONS: [id number]- PT Re-evaluation, 97750- Physical Performance Testing, 97110-Therapeutic exercises, 97530- Therapeutic activity, [id number]- Neuromuscular re-education, 97535- Self Care, [id number]- Manual therapy, [id number]- Gait training, [phone]- Aquatic Therapy, [phone]- Electrical stimulation (unattended), [phone] (1-2 muscles), [phone] (3+ muscles)- Dry Needling, Patient/Family education, Balance training, Spinal mobilization, Cryotherapy, and Moist heat.  PLAN FOR NEXT SESSION: 2-minute walk,  test check home exercise program , core and hip strengthening as tolerated will likely be more comfortable in sitting or standing, modalities for pain Patient interested in aquatics  Alainna Stawicki, PT 01/21/2024, 2:02 PM   Delon Norma, PT 01/21/24 2:02 PM Phone: [phone] Fax: [phone]

## 2024-01-21 ENCOUNTER — Encounter: Payer: Self-pay | Admitting: Physical Therapy

## 2024-01-23 ENCOUNTER — Encounter: Payer: Self-pay | Admitting: Physician Assistant

## 2024-01-23 ENCOUNTER — Ambulatory Visit (INDEPENDENT_AMBULATORY_CARE_PROVIDER_SITE_OTHER): Admitting: Physician Assistant

## 2024-01-23 ENCOUNTER — Other Ambulatory Visit: Payer: Self-pay

## 2024-01-23 VITALS — BP 136/78 | Ht 64.0 in | Wt 319.1 lb

## 2024-01-23 DIAGNOSIS — M47817 Spondylosis without myelopathy or radiculopathy, lumbosacral region: Secondary | ICD-10-CM

## 2024-01-23 DIAGNOSIS — Z923 Personal history of irradiation: Secondary | ICD-10-CM | POA: Diagnosis not present

## 2024-01-23 DIAGNOSIS — N3946 Mixed incontinence: Secondary | ICD-10-CM

## 2024-01-23 DIAGNOSIS — Z8542 Personal history of malignant neoplasm of other parts of uterus: Secondary | ICD-10-CM

## 2024-01-23 DIAGNOSIS — Z8540 Personal history of malignant neoplasm of unspecified female genital organ: Secondary | ICD-10-CM

## 2024-01-23 DIAGNOSIS — M51372 Other intervertebral disc degeneration, lumbosacral region with discogenic back pain and lower extremity pain: Secondary | ICD-10-CM

## 2024-01-23 DIAGNOSIS — Z8544 Personal history of malignant neoplasm of other female genital organs: Secondary | ICD-10-CM

## 2024-01-23 MED ORDER — TIZANIDINE HCL 4 MG PO TABS
4.0000 mg | ORAL_TABLET | Freq: Three times a day (TID) | ORAL | 1 refills | Status: AC | PRN
  Filled 2024-01-23 – 2024-02-23 (×3): qty 90, 30d supply, fill #0

## 2024-01-23 MED ORDER — NITROGLYCERIN 0.4 MG SL SUBL
SUBLINGUAL_TABLET | SUBLINGUAL | 0 refills | Status: DC
  Filled 2024-01-23: qty 25, 30d supply, fill #0
  Filled 2024-02-03 – 2024-02-15 (×2): qty 25, 9d supply, fill #0

## 2024-01-23 MED ORDER — ACETAMINOPHEN 500 MG PO TABS
1000.0000 mg | ORAL_TABLET | Freq: Four times a day (QID) | ORAL | 2 refills | Status: AC | PRN
  Filled 2024-01-23: qty 100, 13d supply, fill #0
  Filled 2024-05-08: qty 200, 25d supply, fill #0

## 2024-01-24 ENCOUNTER — Encounter: Payer: Self-pay | Admitting: Physical Therapy

## 2024-01-24 ENCOUNTER — Ambulatory Visit: Admitting: Physical Therapy

## 2024-01-24 DIAGNOSIS — M5459 Other low back pain: Secondary | ICD-10-CM

## 2024-01-24 DIAGNOSIS — M5416 Radiculopathy, lumbar region: Secondary | ICD-10-CM | POA: Diagnosis not present

## 2024-01-24 DIAGNOSIS — R262 Difficulty in walking, not elsewhere classified: Secondary | ICD-10-CM | POA: Diagnosis not present

## 2024-01-24 NOTE — Therapy (Signed)
 OUTPATIENT PHYSICAL THERAPY THORACOLUMBAR EVALUATION   Patient Name: Kimberly Frazier MRN: [id number] DOB:April 29, 1961, 63 y.o., female Today's Date: 01/24/2024  END OF SESSION:  PT End of Session - 01/24/24 1015     Visit Number 2    Number of Visits 16    Date for PT Re-Evaluation 03/16/24    Authorization Type [employer] Dual complete    PT Start Time 1015    PT Stop Time 1056    PT Time Calculation (min) 41 min    Activity Tolerance Patient tolerated treatment well    Behavior During Therapy WFL for tasks assessed/performed          Past Medical History:  Diagnosis Date   Adrenal adenoma, left    Arthritis    Cancer (HCC)    CKD (chronic kidney disease), stage II    Coronary artery disease 07-28-2018 followed [facility]  currently due to no insurance   per cardiac cath 02-06-2015 (positive mild lateral ishcemia on stress test)--- dLAD 80%,  mLAD 40%,  mPDA 80% (small vessel),  ostial D1 70%,  CFx with lumial irregarlities-- medical management   Depression    Diabetic neuropathy (HCC)    Diabetic retinopathy (HCC)    NPDR OU   Headache    History of Bell's palsy 07/2011   per pt residual facial pain on left side occasionally   History of cancer of vagina 1999   per pt completed radiation and chemo   History of sepsis 12/30/2017   positive blood culter fro E.coli   Hyperlipidemia    Hypertension    Hypertensive retinopathy    OU   Hypokalemia    Insulin  dependent type 2 diabetes mellitus, uncontrolled    followed by pcp---  A1c was 11.7 on 06-15-2018 in epic   Myocardial infarction (HCC)    10 yrs ago?   Nocturia    Peripheral neuropathy    PMB (postmenopausal bleeding)    Wears dentures    upper   Wears glasses    Past Surgical History:  Procedure Laterality Date   BREAST BIOPSY  2012   benign   CARDIAC CATHETERIZATION N/A 02/06/2015   Procedure: Left Heart Cath and Coronary Angiography;  Surgeon: Ezra GORMAN Shuck, MD;  Location: [facility] INVASIVE CV  LAB;  Service: Cardiovascular;  Laterality: N/A;   CATARACT EXTRACTION Bilateral OD: 03/07/19, OS: 02/28/19   Dr. Roz   CATARACT EXTRACTION, BILATERAL     COLONOSCOPY     normal exam in [place], [place] abour 10-12 years ago   EYE SURGERY Bilateral OD: 03/07/19, OS: 02/28/19   Cat Sx - Dr. Roz   HYSTEROSCOPY WITH D & C N/A 08/01/2018   Procedure: DILATATION AND CURETTAGE /HYSTEROSCOPY;  Surgeon: Alger Gong, MD;  Location: [facility];  Service: Gynecology;  Laterality: N/A;   ROBOTIC ASSISTED TOTAL HYSTERECTOMY WITH BILATERAL SALPINGO OOPHERECTOMY N/A 11/28/2018   Procedure: XI ROBOTIC ASSISTED TOTAL HYSTERECTOMY WITH BILATERAL SALPINGO OOPHORECTOMY;  Surgeon: Eloy Herring, MD;  Location: [facility];  Service: Gynecology;  Laterality: N/A;   SENTINEL NODE BIOPSY N/A 11/28/2018   Procedure: SENTINEL NODE BIOPSY;  Surgeon: Eloy Herring, MD;  Location: [facility];  Service: Gynecology;  Laterality: N/A;   UMBILICAL HERNIA REPAIR  child   Patient Active Problem List   Diagnosis Date Noted   Low back pain 04/22/2023   Stage 3b chronic kidney disease (HCC) 08/06/2022   Fecal smearing 09/16/2020   Urinary incontinence, mixed 09/16/2020   Major depressive disorder,  single episode, mild (HCC) 09/16/2020   History of endometrial cancer 07/30/2020   New onset headache 12/21/2019   Cerebral vascular disease 12/21/2019   Migraine without aura and without status migrainosus, not intractable 11/12/2019   Hematuria 11/28/2018   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   History of radiation therapy 11/28/2018   Diabetes mellitus (HCC) 10/06/2018   Type 2 diabetes mellitus with hyperglycemia, with long-term current use of insulin  (HCC) 10/06/2018   Adrenal adenoma, left 06/15/2018   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   Postmenopausal vaginal bleeding 06/15/2018   Hyperkalemia    Diabetic peripheral neuropathy (HCC) 09/03/2016   CAD (coronary artery disease) 04/10/2015   Neck pain  06/20/2014   Severe obesity (BMI >= 40) (HCC) 09/20/2013   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   Hyperlipidemia 10/25/2012   Diabetes mellitus type 2, uncontrolled, with complications 07/26/2012   Essential hypertension 07/26/2012    PCP: [name] MD  REFERRING PROVIDER: [name], MD  REFERRING DIAG: M54.16 (ICD-10-CM) - Left lumbar radiculopathy  Rationale for Evaluation and Treatment: Rehabilitation  THERAPY DIAG:  Other low back pain  Difficulty in walking, not elsewhere classified  ONSET DATE: chronic  SUBJECTIVE:                                                                                                                                                                                           SUBJECTIVE STATEMENT:  01/24/2024:  Pt reports that her back pain is about the same.  She has been pacing her HEP.  EVAL: Patient reports to physical therapy with complaint of lower back pain extending to her left hip and thigh.  The pain has been present for years but is recently increased to the point where she spends most of the time laying in bed.  She has extreme difficulty standing even for short periods of time to do basic home tasks.  At times her knee buckles and even her left foot may catch on the rug.  Once in a while she will have leg numbness but she also is diabetic so not sure the source of that.  She avoids lifting for fear of injuring her back and can stand absolutely no longer than 20 minutes at a time.  She does not walk with a device at this time but she frequently holds onto furniture and walls to maintain stability.     PERTINENT HISTORY:  Diabetes, HTN, obesity neuropathy coronary artery disease chronic kidney disease  PAIN:  Are you having pain? Yes: NPRS scale: 9/10-10/10  Pain location: central low back , wrapping to glute  Pain description: aching, sore , burning Aggravating  factors: standing, walking  Relieving factors: Tylenol , does not use  heating pad or ice, sitting temporarily   PRECAUTIONS: None  RED FLAGS: None   WEIGHT BEARING RESTRICTIONS: No  FALLS:  Has patient fallen in last 6 months? No Has fallen before but years ago.   LIVING ENVIRONMENT: Lives with: lives with their family Lives in: House/apartment Stairs: Yes: External: 4 steps; on right going up Has following equipment at home: None  OCCUPATION: Retired , Psychologist, forensic   PLOF: Independent with basic ADLs, Independent with household mobility without device, Needs assistance with homemaking, and Leisure: limited by her back pain, is bored.  She used to go out a lot more and visit her family.   PATIENT GOALS: Pain to be manageable, how I can reduce it myself   NEXT MD VISIT: after PT   OBJECTIVE:  Note: Objective measures were completed at Evaluation unless otherwise noted.  DIAGNOSTIC FINDINGS:  MRI 12/25/23: IMPRESSION: 1. L3-4: Minimal disc bulge. Mild facet osteoarthritis. No stenosis. 2. L4-5: Bilateral facet osteoarthritis with 1 mm of degenerative anterolisthesis. No apparent compressive stenosis of the canal or foramina. The facet arthritis could be a cause of back pain or referred facet syndrome pain. 3. L5-S1: Disc bulge. Mild facet osteoarthritis. Foraminal narrowing that could possibly affect either or both L5 nerves.  PATIENT SURVEYS:  Modified Oswestry: 70% (35/50)  Interpretation of scores: Score Category Description  0-20% Minimal Disability The patient can cope with most living activities. Usually no treatment is indicated apart from advice on lifting, sitting and exercise  21-40% Moderate Disability The patient experiences more pain and difficulty with sitting, lifting and standing. Travel and social life are more difficult and they may be disabled from work. Personal care, sexual activity and sleeping are not grossly affected, and the patient can usually be managed by conservative means  41-60% Severe Disability Pain remains  the main problem in this group, but activities of daily living are affected. These patients require a detailed investigation  61-80% Crippled Back pain impinges on all aspects of the patient's life. Positive intervention is required  81-100% Bed-bound  These patients are either bed-bound or exaggerating their symptoms  Bluford FORBES Zoe DELENA Karon DELENA, et al. Surgery versus conservative management of stable thoracolumbar fracture: the PRESTO feasibility RCT. [place] ([place]): [employer]; 2021 Nov. [facility] Technology Assessment, No. 25.62.) Appendix 3, Oswestry Disability Index category descriptors. Available from: FindJewelers.cz  Minimally Clinically Important Difference (MCID) = 12.8%  COGNITION: Overall cognitive status: Within functional limits for tasks assessed     SENSATION: WFL  POSTURE: flexed trunk   PALPATION: Generalized pain and soreness across the lower back bilaterally.  LUMBAR ROM:   AROM eval  Flexion Pain on L   Extension 50% Pain on L   Right lateral flexion Limited 25%   Left lateral flexion Limited 25%   Right rotation 25%  Left rotation 25%   (Blank rows = not tested)  LOWER EXTREMITY ROM:   WFL   Passive  Right eval Left eval  Hip flexion WFL WNL, but Painful  Hip extension    Hip abduction    Hip adduction    Hip internal rotation Tight  tight  Hip external rotation tight tight  Knee flexion    Knee extension    Ankle dorsiflexion    Ankle plantarflexion    Ankle inversion    Ankle eversion     (Blank rows = not tested)  LOWER EXTREMITY MMT:    MMT  Right eval Left eval  Hip flexion 4 4-  Hip extension    Hip abduction    Hip adduction    Hip internal rotation    Hip external rotation    Knee flexion 5 4  Knee extension 5 4  Ankle dorsiflexion 5 5  Ankle plantarflexion 3 3  Ankle inversion    Ankle eversion     (Blank rows = not tested)  LUMBAR SPECIAL TESTS:  Straight leg raise test:  Positive on L   FUNCTIONAL TESTS:  5 times sit to stand: 32 sec no UEs  Hip weakness evident with standing marching  GAIT: Distance walked: 150 Assistive device utilized: None Level of assistance: Modified independence Comments: Uses the wall for support  TREATMENT DATE:   [facility] Adult PT Treatment:                                                DATE: 01/24/24 Therapeutic Exercise:  nu-step L4 78m while taking subjective and planning session with patient - tiring  Seated hamstring stretch Lower trunk rotation Supine clam - alternating - Blue TB - 2' x2 Supine hip adduction squeeze - 5'' holds PPT Sit to stand - raised table with UE support - 3x3 Pt education regarding exercise, DOMS, movement snacks                                                                                                                      HOME EXERCISE PROGRAM: Access Code: [email or web address] URL: [email or web address] Date: 01/24/2024 Prepared by: Helene Gasmen  Exercises - Seated Hamstring Stretch  - 2 x daily - 7 x weekly - 1 sets - 3-5 reps - 30 hold - Supine Lower Trunk Rotation  - 2 x daily - 7 x weekly - 1 sets - 10 reps - 10 hold - Sit to Stand with Arm Reach Toward Target  - 5 x daily - 7 x weekly - 2 sets - 3 reps - Hooklying Isometric Clamshell  - 1 x daily - 7 x weekly - 3 sets - 10 reps - Supine Hip Adduction Isometric with Ball  - 1 x daily - 7 x weekly - 2 sets - 10 reps - 5'' hold - Supine Posterior Pelvic Tilt  - 2 x daily - 7 x weekly - 2 sets - 10 reps - 5-10 second hold hold  ASSESSMENT:  CLINICAL IMPRESSION:  Kimberly Frazier tolerated session fair d/t fatigue.  Reviewed HEP and added in some basic hip and core mat strengthening.  Encouraged her to add in sit to stand throughout the day to increase volume.  She will explore going to gym with daughter.    OBJECTIVE IMPAIRMENTS: cardiopulmonary status limiting activity, decreased activity tolerance, decreased balance, decreased  endurance, decreased knowledge of condition, decreased mobility, difficulty walking, decreased ROM, decreased strength, increased fascial restrictions, impaired flexibility, improper body  mechanics, obesity, and pain.   ACTIVITY LIMITATIONS: carrying, lifting, bending, sitting, standing, squatting, stairs, transfers, bed mobility, and locomotion level  PARTICIPATION LIMITATIONS: cleaning, laundry, interpersonal relationship, shopping, and community activity  PERSONAL FACTORS: Social background, Time since onset of injury/illness/exacerbation, and 3+ comorbidities: Obesity, chronic pain and metabolic dysfunction are also affecting patient's functional outcome.   REHAB POTENTIAL: Good  CLINICAL DECISION MAKING: Evolving/moderate complexity  EVALUATION COMPLEXITY: Moderate   GOALS: Goals reviewed with patient? Yes  SHORT TERM GOALS: Target date: 02/18/2024   Patient will be able to show independence for initial HEP to include posture, core and hip strength and stability.   Baseline: Goal status: INITIAL  2.  Patient will be able to complete 2 min walk test  Baseline:  Goal status: INITIAL  3.  Patient will demonstrate transfers from the bed which do not increase her back pain  Baseline: Goal status: INITIAL   LONG TERM GOALS: Target date: 03/17/2024    Patient will be independent with final HEP upon discharge from PT and report consistent benefit following exercise completion.    Baseline:  Goal status: INITIAL  2.  Patient will be able to improve 2 min walk test distance by 50 feet or more with LRAD and no increase in pain.   Baseline:  Goal status: INITIAL  3.  Patient will be able to demonstrate hip/knee strength to 5/5 in order to maximize functional mobility, ambulation.  Baseline:  Goal status: INITIAL  4.  Patient will be able to stand for 20 minutes to do light home tasks with no more than minimal increase in lower back pain Baseline:  Goal status: INITIAL  5.   Patient will report rare instances of pain radiating into her left leg/thigh.  Baseline:  Goal status: INITIAL  6. Patient will report being able to walk in and out of the store for < 15 min with no more than min increase in back pain.  Baseline:  Goal status: INITIAL  PLAN:  PT FREQUENCY: 2x/week  PT DURATION: 8 weeks  PLANNED INTERVENTIONS: 97164- PT Re-evaluation, 97750- Physical Performance Testing, 97110-Therapeutic exercises, 97530- Therapeutic activity, [id number]- Neuromuscular re-education, 97535- Self Care, [id number]- Manual therapy, [id number]- Gait training, [phone]- Aquatic Therapy, [phone]- Electrical stimulation (unattended), [phone] (1-2 muscles), [id number] (3+ muscles)- Dry Needling, Patient/Family education, Balance training, Spinal mobilization, Cryotherapy, and Moist heat.  PLAN FOR NEXT SESSION: 2-minute walk,  test check home exercise program , core and hip strengthening as tolerated will likely be more comfortable in sitting or standing, modalities for pain Patient interested in [address] [place], PT 01/24/2024, 10:59 AM   Phone: [phone] Fax: [phone]

## 2024-01-27 ENCOUNTER — Ambulatory Visit
Admission: RE | Admit: 2024-01-27 | Discharge: 2024-01-27 | Disposition: A | Discharge status: Home / Self Care | Source: Ambulatory Visit | Attending: Physician Assistant | Admitting: Physician Assistant

## 2024-01-27 DIAGNOSIS — Z8544 Personal history of malignant neoplasm of other female genital organs: Secondary | ICD-10-CM

## 2024-01-27 DIAGNOSIS — Z923 Personal history of irradiation: Secondary | ICD-10-CM

## 2024-01-27 DIAGNOSIS — K760 Fatty (change of) liver, not elsewhere classified: Secondary | ICD-10-CM | POA: Diagnosis not present

## 2024-01-27 DIAGNOSIS — D3502 Benign neoplasm of left adrenal gland: Secondary | ICD-10-CM | POA: Diagnosis not present

## 2024-01-27 DIAGNOSIS — N3946 Mixed incontinence: Secondary | ICD-10-CM

## 2024-01-27 DIAGNOSIS — Z8542 Personal history of malignant neoplasm of other parts of uterus: Secondary | ICD-10-CM

## 2024-01-27 DIAGNOSIS — K429 Umbilical hernia without obstruction or gangrene: Secondary | ICD-10-CM | POA: Diagnosis not present

## 2024-02-01 ENCOUNTER — Other Ambulatory Visit: Payer: Self-pay

## 2024-02-01 ENCOUNTER — Telehealth: Payer: Self-pay | Admitting: Pharmacist

## 2024-02-01 NOTE — Telephone Encounter (Signed)
 Hey friends. This is one of my [facility] diabetes patients. She missed dan appointment with me 01/06/24. Can we try to get her on my schedule next month?

## 2024-02-02 ENCOUNTER — Ambulatory Visit: Admitting: Physical Therapy

## 2024-02-02 ENCOUNTER — Other Ambulatory Visit: Payer: Self-pay

## 2024-02-02 NOTE — Telephone Encounter (Signed)
Thank you, friend!

## 2024-02-03 ENCOUNTER — Telehealth: Payer: Self-pay

## 2024-02-03 ENCOUNTER — Other Ambulatory Visit: Payer: Self-pay

## 2024-02-03 NOTE — Telephone Encounter (Signed)
 Patient was identified as falling into the [place] Measure - Diabetes.   Patient was: Appointment scheduled for lab or office visit for A1c.

## 2024-02-06 ENCOUNTER — Other Ambulatory Visit: Payer: Self-pay | Admitting: Cardiology

## 2024-02-07 ENCOUNTER — Other Ambulatory Visit: Payer: Self-pay

## 2024-02-08 ENCOUNTER — Encounter: Payer: Self-pay | Admitting: Physical Therapy

## 2024-02-08 ENCOUNTER — Ambulatory Visit: Payer: Self-pay | Admitting: Physician Assistant

## 2024-02-08 ENCOUNTER — Other Ambulatory Visit: Payer: Self-pay

## 2024-02-08 ENCOUNTER — Ambulatory Visit: Attending: Internal Medicine | Admitting: Physical Therapy

## 2024-02-08 DIAGNOSIS — M5459 Other low back pain: Secondary | ICD-10-CM | POA: Diagnosis not present

## 2024-02-08 DIAGNOSIS — R262 Difficulty in walking, not elsewhere classified: Secondary | ICD-10-CM | POA: Insufficient documentation

## 2024-02-08 MED ORDER — EZETIMIBE 10 MG PO TABS
10.0000 mg | ORAL_TABLET | Freq: Every day | ORAL | 0 refills | Status: DC
  Filled 2024-02-08 – 2024-02-20 (×2): qty 15, 15d supply, fill #0

## 2024-02-08 NOTE — Therapy (Unsigned)
 OUTPATIENT PHYSICAL THERAPY THORACOLUMBAR EVALUATION   Patient Name: Kimberly Frazier MRN: [id number] DOB:[birth year], 63 y.o., female Today's Date: 02/09/2024  END OF SESSION:  PT End of Session - 02/08/24 1604     Visit Number 3    Number of Visits 16    Date for PT Re-Evaluation 03/16/24    Authorization Type [employer] Dual complete    PT Start Time 1602   pt arrived late   PT Stop Time 1630    PT Time Calculation (min) 28 min    Activity Tolerance Patient tolerated treatment well    Behavior During Therapy WFL for tasks assessed/performed           Past Medical History:  Diagnosis Date   Adrenal adenoma, left    Arthritis    Cancer (HCC)    CKD (chronic kidney disease), stage II    Coronary artery disease 07-28-2018 followed [facility]  currently due to no insurance   per cardiac cath 02-06-2015 (positive mild lateral ishcemia on stress test)--- dLAD 80%,  mLAD 40%,  mPDA 80% (small vessel),  ostial D1 70%,  CFx with lumial irregarlities-- medical management   Depression    Diabetic neuropathy (HCC)    Diabetic retinopathy (HCC)    NPDR OU   Headache    History of Bell's palsy 07/2011   per pt residual facial pain on left side occasionally   History of cancer of vagina 1999   per pt completed radiation and chemo   History of sepsis 12/30/2017   positive blood culter fro E.coli   Hyperlipidemia    Hypertension    Hypertensive retinopathy    OU   Hypokalemia    Insulin  dependent type 2 diabetes mellitus, uncontrolled    followed by pcp---  A1c was 11.7 on 06-15-2018 in epic   Myocardial infarction (HCC)    10 yrs ago?   Nocturia    Peripheral neuropathy    PMB (postmenopausal bleeding)    Wears dentures    upper   Wears glasses    Past Surgical History:  Procedure Laterality Date   BREAST BIOPSY  2012   benign   CARDIAC CATHETERIZATION N/A 02/06/2015   Procedure: Left Heart Cath and Coronary Angiography;  Surgeon: Ezra GORMAN Shuck, MD;   Location: [facility] INVASIVE CV LAB;  Service: Cardiovascular;  Laterality: N/A;   CATARACT EXTRACTION Bilateral OD: 03/07/19, OS: 02/28/19   Dr. Roz   CATARACT EXTRACTION, BILATERAL     COLONOSCOPY     normal exam in [place], [place] abour 10-12 years ago   EYE SURGERY Bilateral OD: 03/07/19, OS: 02/28/19   Cat Sx - Dr. Roz   HYSTEROSCOPY WITH D & C N/A 08/01/2018   Procedure: DILATATION AND CURETTAGE /HYSTEROSCOPY;  Surgeon: Alger Gong, MD;  Location: [facility];  Service: Gynecology;  Laterality: N/A;   ROBOTIC ASSISTED TOTAL HYSTERECTOMY WITH BILATERAL SALPINGO OOPHERECTOMY N/A 11/28/2018   Procedure: XI ROBOTIC ASSISTED TOTAL HYSTERECTOMY WITH BILATERAL SALPINGO OOPHORECTOMY;  Surgeon: Eloy Herring, MD;  Location: [facility];  Service: Gynecology;  Laterality: N/A;   SENTINEL NODE BIOPSY N/A 11/28/2018   Procedure: SENTINEL NODE BIOPSY;  Surgeon: Eloy Herring, MD;  Location: [facility];  Service: Gynecology;  Laterality: N/A;   UMBILICAL HERNIA REPAIR  child   Patient Active Problem List   Diagnosis Date Noted   Low back pain 04/22/2023   Stage 3b chronic kidney disease (HCC) 08/06/2022   Fecal smearing 09/16/2020   Urinary incontinence, mixed 09/16/2020  Major depressive disorder, single episode, mild (HCC) 09/16/2020   History of endometrial cancer 07/30/2020   New onset headache 12/21/2019   Cerebral vascular disease 12/21/2019   Migraine without aura and without status migrainosus, not intractable 11/12/2019   Hematuria 11/28/2018   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   History of radiation therapy 11/28/2018   Diabetes mellitus (HCC) 10/06/2018   Type 2 diabetes mellitus with hyperglycemia, with long-term current use of insulin  (HCC) 10/06/2018   Adrenal adenoma, left 06/15/2018   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   Postmenopausal vaginal bleeding 06/15/2018   Hyperkalemia    Diabetic peripheral neuropathy (HCC) 09/03/2016   CAD (coronary artery disease)  04/10/2015   Neck pain 06/20/2014   Severe obesity (BMI >= 40) (HCC) 09/20/2013   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   Hyperlipidemia 10/25/2012   Diabetes mellitus type 2, uncontrolled, with complications 07/26/2012   Essential hypertension 07/26/2012    PCP: [name] MD  REFERRING PROVIDER: [name], MD  REFERRING DIAG: M54.16 (ICD-10-CM) - Left lumbar radiculopathy  Rationale for Evaluation and Treatment: Rehabilitation  THERAPY DIAG:  Other low back pain  Difficulty in walking, not elsewhere classified  ONSET DATE: chronic  SUBJECTIVE:                                                                                                                                                                                           SUBJECTIVE STATEMENT:  02/09/2024:  Pt reports she is a little nervous about the water .  Her back pain is about an 8/10  EVAL: Patient reports to physical therapy with complaint of lower back pain extending to her left hip and thigh.  The pain has been present for years but is recently increased to the point where she spends most of the time laying in bed.  She has extreme difficulty standing even for short periods of time to do basic home tasks.  At times her knee buckles and even her left foot may catch on the rug.  Once in a while she will have leg numbness but she also is diabetic so not sure the source of that.  She avoids lifting for fear of injuring her back and can stand absolutely no longer than 20 minutes at a time.  She does not walk with a device at this time but she frequently holds onto furniture and walls to maintain stability.     PERTINENT HISTORY:  Diabetes, HTN, obesity neuropathy coronary artery disease chronic kidney disease  PAIN:  Are you having pain? Yes: NPRS scale: 9/10-10/10  Pain location: central low back , wrapping to glute  Pain description: aching,  sore , burning Aggravating factors: standing, walking  Relieving  factors: Tylenol , does not use heating pad or ice, sitting temporarily   PRECAUTIONS: None  RED FLAGS: None   WEIGHT BEARING RESTRICTIONS: No  FALLS:  Has patient fallen in last 6 months? No Has fallen before but years ago.   LIVING ENVIRONMENT: Lives with: lives with their family Lives in: House/apartment Stairs: Yes: External: 4 steps; on right going up Has following equipment at home: None  OCCUPATION: Retired , Psychologist, forensic   PLOF: Independent with basic ADLs, Independent with household mobility without device, Needs assistance with homemaking, and Leisure: limited by her back pain, is bored.  She used to go out a lot more and visit her family.   PATIENT GOALS: Pain to be manageable, how I can reduce it myself   NEXT MD VISIT: after PT   OBJECTIVE:  Note: Objective measures were completed at Evaluation unless otherwise noted.  DIAGNOSTIC FINDINGS:  MRI 12/25/23: IMPRESSION: 1. L3-4: Minimal disc bulge. Mild facet osteoarthritis. No stenosis. 2. L4-5: Bilateral facet osteoarthritis with 1 mm of degenerative anterolisthesis. No apparent compressive stenosis of the canal or foramina. The facet arthritis could be a cause of back pain or referred facet syndrome pain. 3. L5-S1: Disc bulge. Mild facet osteoarthritis. Foraminal narrowing that could possibly affect either or both L5 nerves.  PATIENT SURVEYS:  Modified Oswestry: 70% (35/50)  Interpretation of scores: Score Category Description  0-20% Minimal Disability The patient can cope with most living activities. Usually no treatment is indicated apart from advice on lifting, sitting and exercise  21-40% Moderate Disability The patient experiences more pain and difficulty with sitting, lifting and standing. Travel and social life are more difficult and they may be disabled from work. Personal care, sexual activity and sleeping are not grossly affected, and the patient can usually be managed by conservative means  41-60%  Severe Disability Pain remains the main problem in this group, but activities of daily living are affected. These patients require a detailed investigation  61-80% Crippled Back pain impinges on all aspects of the patient's life. Positive intervention is required  81-100% Bed-bound  These patients are either bed-bound or exaggerating their symptoms  Bluford FORBES Zoe DELENA Karon DELENA, et al. Surgery versus conservative management of stable thoracolumbar fracture: the PRESTO feasibility RCT. [place] ([place]): [employer]; 2021 Nov. [facility] Technology Assessment, No. 25.62.) Appendix 3, Oswestry Disability Index category descriptors. Available from: FindJewelers.cz  Minimally Clinically Important Difference (MCID) = 12.8%  COGNITION: Overall cognitive status: Within functional limits for tasks assessed     SENSATION: WFL  POSTURE: flexed trunk   PALPATION: Generalized pain and soreness across the lower back bilaterally.  LUMBAR ROM:   AROM eval  Flexion Pain on L   Extension 50% Pain on L   Right lateral flexion Limited 25%   Left lateral flexion Limited 25%   Right rotation 25%  Left rotation 25%   (Blank rows = not tested)  LOWER EXTREMITY ROM:   WFL   Passive  Right eval Left eval  Hip flexion WFL WNL, but Painful  Hip extension    Hip abduction    Hip adduction    Hip internal rotation Tight  tight  Hip external rotation tight tight  Knee flexion    Knee extension    Ankle dorsiflexion    Ankle plantarflexion    Ankle inversion    Ankle eversion     (Blank rows = not tested)  LOWER EXTREMITY MMT:  MMT Right eval Left eval  Hip flexion 4 4-  Hip extension    Hip abduction    Hip adduction    Hip internal rotation    Hip external rotation    Knee flexion 5 4  Knee extension 5 4  Ankle dorsiflexion 5 5  Ankle plantarflexion 3 3  Ankle inversion    Ankle eversion     (Blank rows = not tested)  LUMBAR SPECIAL TESTS:   Straight leg raise test: Positive on L   FUNCTIONAL TESTS:  5 times sit to stand: 32 sec no UEs  Hip weakness evident with standing marching  GAIT: Distance walked: 150 Assistive device utilized: None Level of assistance: Modified independence Comments: Uses the wall for support  TREATMENT DATE:   TREATMENT 02/08/24:  Aquatic therapy at [facility]- [address] - therapeutic pool temp 92 degrees Pt enters building independently.  Treatment took place in water  3.8 to  4 ft 8 in.feet deep depending upon activity.  Pt entered and exited the pool via stair and handrails    Aquatic Therapy:  Water  walking for warm up fwd/lat  Mini squat  Hip abd Hs curl Heel raise Bil shoulder ext - Blue DB Yellow noodle stomp - 15x  Pt requires the buoyancy of water  for active assisted exercises with buoyancy supported for strengthening and AROM exercises. Hydrostatic pressure also supports joints by unweighting joint load by at least 50 % in 3-4 feet depth water . 80% in chest to neck deep water . Water  will provide assistance with movement using the current and laminar flow while the buoyancy reduces weight bearing. Pt requires the viscosity of the water  for resistance with strengthening exercises.  [facility] Adult PT Treatment:                                                DATE: 01/24/24 Therapeutic Exercise:  nu-step L4 78m while taking subjective and planning session with patient - tiring  Seated hamstring stretch Lower trunk rotation Supine clam - alternating - Blue TB - 2' x2 Supine hip adduction squeeze - 5'' holds PPT Sit to stand - raised table with UE support - 3x3 Pt education regarding exercise, DOMS, movement snacks                                                                                                                      HOME EXERCISE PROGRAM: Access Code: [email or web address] URL: [email or web address] Date: 01/24/2024 Prepared by: Helene Gasmen  Exercises - Seated Hamstring Stretch  - 2 x daily - 7 x weekly - 1 sets - 3-5 reps - 30 hold - Supine Lower Trunk Rotation  - 2 x daily - 7 x weekly - 1 sets - 10 reps - 10 hold - Sit to Stand with Arm Reach Toward Target  - 5 x daily - 7 x weekly -  2 sets - 3 reps - Hooklying Isometric Clamshell  - 1 x daily - 7 x weekly - 3 sets - 10 reps - Supine Hip Adduction Isometric with Ball  - 1 x daily - 7 x weekly - 2 sets - 10 reps - 5'' hold - Supine Posterior Pelvic Tilt  - 2 x daily - 7 x weekly - 2 sets - 10 reps - 5-10 second hold hold  ASSESSMENT:  CLINICAL IMPRESSION:  Session today focused on hip/core strengthening and general activity tolerance in the aquatic environment for use of buoyancy to offload joints and the viscosity of water  as resistance during therapeutic exercise.  Pt initially nervous but acclimates to water  quickly.  Reports high levels of fatigue but minimal increase in discomfort.  Patient was able to tolerate all prescribed exercises in the aquatic environment with no adverse effects and reports no increase in pain at the end of the session. Patient continues to benefit from skilled PT services on land and aquatic based and should be progressed as able to improve functional independence.     OBJECTIVE IMPAIRMENTS: cardiopulmonary status limiting activity, decreased activity tolerance, decreased balance, decreased endurance, decreased knowledge of condition, decreased mobility, difficulty walking, decreased ROM, decreased strength, increased fascial restrictions, impaired flexibility, improper body mechanics, obesity, and pain.   ACTIVITY LIMITATIONS: carrying, lifting, bending, sitting, standing, squatting, stairs, transfers, bed mobility, and locomotion level  PARTICIPATION LIMITATIONS: cleaning, laundry, interpersonal relationship, shopping, and community activity  PERSONAL FACTORS: Social background, Time since onset of injury/illness/exacerbation, and 3+  comorbidities: Obesity, chronic pain and metabolic dysfunction are also affecting patient's functional outcome.   REHAB POTENTIAL: Good  CLINICAL DECISION MAKING: Evolving/moderate complexity  EVALUATION COMPLEXITY: Moderate   GOALS: Goals reviewed with patient? Yes  SHORT TERM GOALS: Target date: 02/18/2024   Patient will be able to show independence for initial HEP to include posture, core and hip strength and stability.   Baseline: Goal status: INITIAL  2.  Patient will be able to complete 2 min walk test  Baseline:  Goal status: INITIAL  3.  Patient will demonstrate transfers from the bed which do not increase her back pain  Baseline: Goal status: INITIAL   LONG TERM GOALS: Target date: 03/17/2024    Patient will be independent with final HEP upon discharge from PT and report consistent benefit following exercise completion.    Baseline:  Goal status: INITIAL  2.  Patient will be able to improve 2 min walk test distance by 50 feet or more with LRAD and no increase in pain.   Baseline:  Goal status: INITIAL  3.  Patient will be able to demonstrate hip/knee strength to 5/5 in order to maximize functional mobility, ambulation.  Baseline:  Goal status: INITIAL  4.  Patient will be able to stand for 20 minutes to do light home tasks with no more than minimal increase in lower back pain Baseline:  Goal status: INITIAL  5.  Patient will report rare instances of pain radiating into her left leg/thigh.  Baseline:  Goal status: INITIAL  6. Patient will report being able to walk in and out of the store for < 15 min with no more than min increase in back pain.  Baseline:  Goal status: INITIAL  PLAN:  PT FREQUENCY: 2x/week  PT DURATION: 8 weeks  PLANNED INTERVENTIONS: 97164- PT Re-evaluation, 97750- Physical Performance Testing, 97110-Therapeutic exercises, 97530- Therapeutic activity, [id number]- Neuromuscular re-education, 97535- Self Care, [id number]- Manual therapy, [id number]-  Gait training, [phone]- Aquatic Therapy,  [id number]- Electrical stimulation (unattended), 20560 (1-2 muscles), 20561 (3+ muscles)- Dry Needling, Patient/Family education, Balance training, Spinal mobilization, Cryotherapy, and Moist heat.  PLAN FOR NEXT SESSION: 2-minute walk,  test check home exercise program , core and hip strengthening as tolerated will likely be more comfortable in sitting or standing, modalities for pain Patient interested in [address] [place], PT 02/09/2024, 7:59 AM   Phone: [phone] Fax: [phone]

## 2024-02-09 ENCOUNTER — Other Ambulatory Visit: Payer: Self-pay

## 2024-02-10 ENCOUNTER — Ambulatory Visit

## 2024-02-10 DIAGNOSIS — R262 Difficulty in walking, not elsewhere classified: Secondary | ICD-10-CM

## 2024-02-10 DIAGNOSIS — M5459 Other low back pain: Secondary | ICD-10-CM

## 2024-02-10 NOTE — Therapy (Signed)
 OUTPATIENT PHYSICAL THERAPY TREATMENT NOTE   Patient Name: Kimberly Frazier MRN: [id number] DOB:10/22/1960, 63 y.o., female Today's Date: 02/10/2024  END OF SESSION:  PT End of Session - 02/10/24 1133     Visit Number 4    Number of Visits 16    Date for PT Re-Evaluation 03/16/24    Authorization Type [employer] Dual complete    PT Start Time 1132    PT Stop Time 1205    PT Time Calculation (min) 33 min    Activity Tolerance Patient tolerated treatment well    Behavior During Therapy WFL for tasks assessed/performed         Past Medical History:  Diagnosis Date   Adrenal adenoma, left    Arthritis    Cancer (HCC)    CKD (chronic kidney disease), stage II    Coronary artery disease 07-28-2018 followed [facility]  currently due to no insurance   per cardiac cath 02-06-2015 (positive mild lateral ishcemia on stress test)--- dLAD 80%,  mLAD 40%,  mPDA 80% (small vessel),  ostial D1 70%,  CFx with lumial irregarlities-- medical management   Depression    Diabetic neuropathy (HCC)    Diabetic retinopathy (HCC)    NPDR OU   Headache    History of Bell's palsy 07/2011   per pt residual facial pain on left side occasionally   History of cancer of vagina 1999   per pt completed radiation and chemo   History of sepsis 12/30/2017   positive blood culter fro E.coli   Hyperlipidemia    Hypertension    Hypertensive retinopathy    OU   Hypokalemia    Insulin  dependent type 2 diabetes mellitus, uncontrolled    followed by pcp---  A1c was 11.7 on 06-15-2018 in epic   Myocardial infarction (HCC)    10 yrs ago?   Nocturia    Peripheral neuropathy    PMB (postmenopausal bleeding)    Wears dentures    upper   Wears glasses    Past Surgical History:  Procedure Laterality Date   BREAST BIOPSY  2012   benign   CARDIAC CATHETERIZATION N/A 02/06/2015   Procedure: Left Heart Cath and Coronary Angiography;  Surgeon: Ezra GORMAN Shuck, MD;  Location: [facility] [place] INVASIVE CV LAB;   Service: Cardiovascular;  Laterality: N/A;   CATARACT EXTRACTION Bilateral OD: 03/07/19, OS: 02/28/19   Dr. Roz   CATARACT EXTRACTION, BILATERAL     COLONOSCOPY     normal exam in [place], [place] abour 10-12 years ago   EYE SURGERY Bilateral OD: 03/07/19, OS: 02/28/19   Cat Sx - Dr. Roz   HYSTEROSCOPY WITH D & C N/A 08/01/2018   Procedure: DILATATION AND CURETTAGE /HYSTEROSCOPY;  Surgeon: Alger Gong, MD;  Location:  SURGERY CENTER;  Service: Gynecology;  Laterality: N/A;   ROBOTIC ASSISTED TOTAL HYSTERECTOMY WITH BILATERAL SALPINGO OOPHERECTOMY N/A 11/28/2018   Procedure: XI ROBOTIC ASSISTED TOTAL HYSTERECTOMY WITH BILATERAL SALPINGO OOPHORECTOMY;  Surgeon: Eloy Herring, MD;  Location: [facility];  Service: Gynecology;  Laterality: N/A;   SENTINEL NODE BIOPSY N/A 11/28/2018   Procedure: SENTINEL NODE BIOPSY;  Surgeon: Eloy Herring, MD;  Location: [facility];  Service: Gynecology;  Laterality: N/A;   UMBILICAL HERNIA REPAIR  child   Patient Active Problem List   Diagnosis Date Noted   Low back pain 04/22/2023   Stage 3b chronic kidney disease (HCC) 08/06/2022   Fecal smearing 09/16/2020   Urinary incontinence, mixed 09/16/2020   Major depressive disorder, single  episode, mild (HCC) 09/16/2020   History of endometrial cancer 07/30/2020   New onset headache 12/21/2019   Cerebral vascular disease 12/21/2019   Migraine without aura and without status migrainosus, not intractable 11/12/2019   Hematuria 11/28/2018   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   History of radiation therapy 11/28/2018   Diabetes mellitus (HCC) 10/06/2018   Type 2 diabetes mellitus with hyperglycemia, with long-term current use of insulin  (HCC) 10/06/2018   Adrenal adenoma, left 06/15/2018   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   Postmenopausal vaginal bleeding 06/15/2018   Hyperkalemia    Diabetic peripheral neuropathy (HCC) 09/03/2016   CAD (coronary artery disease) 04/10/2015   Neck pain 06/20/2014    Severe obesity (BMI >= 40) (HCC) 09/20/2013   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   Hyperlipidemia 10/25/2012   Diabetes mellitus type 2, uncontrolled, with complications 07/26/2012   Essential hypertension 07/26/2012    PCP: [name] MD  REFERRING PROVIDER: [name], MD  REFERRING DIAG: M54.16 (ICD-10-CM) - Left lumbar radiculopathy  Rationale for Evaluation and Treatment: Rehabilitation  THERAPY DIAG:  Other low back pain  Difficulty in walking, not elsewhere classified  ONSET DATE: chronic  SUBJECTIVE:                                                                                                                                                                                           SUBJECTIVE STATEMENT: Patient reports that aquatics went well at previous session, continues to have back pain.   EVAL: Patient reports to physical therapy with complaint of lower back pain extending to her left hip and thigh.  The pain has been present for years but is recently increased to the point where she spends most of the time laying in bed.  She has extreme difficulty standing even for short periods of time to do basic home tasks.  At times her knee buckles and even her left foot may catch on the rug.  Once in a while she will have leg numbness but she also is diabetic so not sure the source of that.  She avoids lifting for fear of injuring her back and can stand absolutely no longer than 20 minutes at a time.  She does not walk with a device at this time but she frequently holds onto furniture and walls to maintain stability.     PERTINENT HISTORY:  Diabetes, HTN, obesity neuropathy coronary artery disease chronic kidney disease  PAIN:  Are you having pain? Yes: NPRS scale: 9/10-10/10  Pain location: central low back , wrapping to glute  Pain description: aching, sore , burning Aggravating factors: standing, walking  Relieving factors:  Tylenol , does not use heating pad or  ice, sitting temporarily   PRECAUTIONS: None  RED FLAGS: None   WEIGHT BEARING RESTRICTIONS: No  FALLS:  Has patient fallen in last 6 months? No Has fallen before but years ago.   LIVING ENVIRONMENT: Lives with: lives with their family Lives in: House/apartment Stairs: Yes: External: 4 steps; on right going up Has following equipment at home: None  OCCUPATION: Retired , Psychologist, forensic   PLOF: Independent with basic ADLs, Independent with household mobility without device, Needs assistance with homemaking, and Leisure: limited by her back pain, is bored.  She used to go out a lot more and visit her family.   PATIENT GOALS: Pain to be manageable, how I can reduce it myself   NEXT MD VISIT: after PT   OBJECTIVE:  Note: Objective measures were completed at Evaluation unless otherwise noted.  DIAGNOSTIC FINDINGS:  MRI 12/25/23: IMPRESSION: 1. L3-4: Minimal disc bulge. Mild facet osteoarthritis. No stenosis. 2. L4-5: Bilateral facet osteoarthritis with 1 mm of degenerative anterolisthesis. No apparent compressive stenosis of the canal or foramina. The facet arthritis could be a cause of back pain or referred facet syndrome pain. 3. L5-S1: Disc bulge. Mild facet osteoarthritis. Foraminal narrowing that could possibly affect either or both L5 nerves.  PATIENT SURVEYS:  Modified Oswestry: 70% (35/50)  Interpretation of scores: Score Category Description  0-20% Minimal Disability The patient can cope with most living activities. Usually no treatment is indicated apart from advice on lifting, sitting and exercise  21-40% Moderate Disability The patient experiences more pain and difficulty with sitting, lifting and standing. Travel and social life are more difficult and they may be disabled from work. Personal care, sexual activity and sleeping are not grossly affected, and the patient can usually be managed by conservative means  41-60% Severe Disability Pain remains the main  problem in this group, but activities of daily living are affected. These patients require a detailed investigation  61-80% Crippled Back pain impinges on all aspects of the patient's life. Positive intervention is required  81-100% Bed-bound  These patients are either bed-bound or exaggerating their symptoms  Bluford FORBES Zoe DELENA Karon DELENA, et al. Surgery versus conservative management of stable thoracolumbar fracture: the PRESTO feasibility RCT. [place] ([place]): [employer]; 2021 Nov. [facility] Technology Assessment, No. 25.62.) Appendix 3, Oswestry Disability Index category descriptors. Available from: FindJewelers.cz  Minimally Clinically Important Difference (MCID) = 12.8%  COGNITION: Overall cognitive status: Within functional limits for tasks assessed     SENSATION: WFL  POSTURE: flexed trunk   PALPATION: Generalized pain and soreness across the lower back bilaterally.  LUMBAR ROM:   AROM eval  Flexion Pain on L   Extension 50% Pain on L   Right lateral flexion Limited 25%   Left lateral flexion Limited 25%   Right rotation 25%  Left rotation 25%   (Blank rows = not tested)  LOWER EXTREMITY ROM:   WFL   Passive  Right eval Left eval  Hip flexion WFL WNL, but Painful  Hip extension    Hip abduction    Hip adduction    Hip internal rotation Tight  tight  Hip external rotation tight tight  Knee flexion    Knee extension    Ankle dorsiflexion    Ankle plantarflexion    Ankle inversion    Ankle eversion     (Blank rows = not tested)  LOWER EXTREMITY MMT:    MMT Right eval Left eval  Hip  flexion 4 4-  Hip extension    Hip abduction    Hip adduction    Hip internal rotation    Hip external rotation    Knee flexion 5 4  Knee extension 5 4  Ankle dorsiflexion 5 5  Ankle plantarflexion 3 3  Ankle inversion    Ankle eversion     (Blank rows = not tested)  LUMBAR SPECIAL TESTS:  Straight leg raise test: Positive on  L   FUNCTIONAL TESTS:  5 times sit to stand: 32 sec no UEs  Hip weakness evident with standing marching  GAIT: Distance walked: 150 Assistive device utilized: None Level of assistance: Modified independence Comments: Uses the wall for support  TREATMENT DATE:  [facility] Adult PT Treatment:                                                DATE: 02/10/24 Therapeutic Exercise: nu-step L5 x 8m while taking subjective and planning session with patient - tiring  Seated hamstring stretch 2x30 BIL Supine clam - Blue TB - 2' x2 Neuromuscular Re-ed: Supine hip adduction squeeze - 5'' holds x10 PPT 3 hold 2x10 Therapeutic Activity: Sit to stand - raised table with UE support - 2x5   TREATMENT 02/08/24:  Aquatic therapy at [facility]- [address] - therapeutic pool temp 92 degrees Pt enters building independently.  Treatment took place in water  3.8 to  4 ft 8 in.feet deep depending upon activity.  Pt entered and exited the pool via stair and handrails    Aquatic Therapy:  Water  walking for warm up fwd/lat  Mini squat  Hip abd Hs curl Heel raise Bil shoulder ext - Blue DB Yellow noodle stomp - 15x  Pt requires the buoyancy of water  for active assisted exercises with buoyancy supported for strengthening and AROM exercises. Hydrostatic pressure also supports joints by unweighting joint load by at least 50 % in 3-4 feet depth water . 80% in chest to neck deep water . Water  will provide assistance with movement using the current and laminar flow while the buoyancy reduces weight bearing. Pt requires the viscosity of the water  for resistance with strengthening exercises.  [facility] Adult PT Treatment:                                                DATE: 01/24/24 Therapeutic Exercise:  nu-step L4 76m while taking subjective and planning session with patient - tiring  Seated hamstring stretch Lower trunk rotation Supine clam - alternating - Blue TB - 2' x2 Supine hip adduction squeeze - 5''  holds PPT Sit to stand - raised table with UE support - 3x3 Pt education regarding exercise, DOMS, movement snacks  HOME EXERCISE PROGRAM: Access Code: [email or web address] URL: [email or web address] Date: 01/24/2024 Prepared by: Helene Gasmen  Exercises - Seated Hamstring Stretch  - 2 x daily - 7 x weekly - 1 sets - 3-5 reps - 30 hold - Supine Lower Trunk Rotation  - 2 x daily - 7 x weekly - 1 sets - 10 reps - 10 hold - Sit to Stand with Arm Reach Toward Target  - 5 x daily - 7 x weekly - 2 sets - 3 reps - Hooklying Isometric Clamshell  - 1 x daily - 7 x weekly - 3 sets - 10 reps - Supine Hip Adduction Isometric with Ball  - 1 x daily - 7 x weekly - 2 sets - 10 reps - 5'' hold - Supine Posterior Pelvic Tilt  - 2 x daily - 7 x weekly - 2 sets - 10 reps - 5-10 second hold hold  ASSESSMENT:  CLINICAL IMPRESSION: Patient presents to PT reporting continued back pain that radiates down her LLE, states aquatics went well at previous session. Will perform at beginning of next land session to establish baseline, was too fatigued from [place] this session. Session today focused on gentle strengthening and stretching to improve core strength and decrease overall back pain. She is somewhat limited by overall fatigue during session, needing occasional rest breaks. Patient continues to benefit from skilled PT services and should be progressed as able to improve functional independence.     OBJECTIVE IMPAIRMENTS: cardiopulmonary status limiting activity, decreased activity tolerance, decreased balance, decreased endurance, decreased knowledge of condition, decreased mobility, difficulty walking, decreased ROM, decreased strength, increased fascial restrictions, impaired flexibility, improper body mechanics, obesity, and pain.   ACTIVITY LIMITATIONS: carrying, lifting, bending,  sitting, standing, squatting, stairs, transfers, bed mobility, and locomotion level  PARTICIPATION LIMITATIONS: cleaning, laundry, interpersonal relationship, shopping, and community activity  PERSONAL FACTORS: Social background, Time since onset of injury/illness/exacerbation, and 3+ comorbidities: Obesity, chronic pain and metabolic dysfunction are also affecting patient's functional outcome.   REHAB POTENTIAL: Good  CLINICAL DECISION MAKING: Evolving/moderate complexity  EVALUATION COMPLEXITY: Moderate   GOALS: Goals reviewed with patient? Yes  SHORT TERM GOALS: Target date: 02/18/2024   Patient will be able to show independence for initial HEP to include posture, core and hip strength and stability.   Baseline: Goal status: INITIAL  2.  Patient will be able to complete 2 min walk test  Baseline:  Goal status: INITIAL  3.  Patient will demonstrate transfers from the bed which do not increase her back pain  Baseline: Goal status: INITIAL   LONG TERM GOALS: Target date: 03/17/2024    Patient will be independent with final HEP upon discharge from PT and report consistent benefit following exercise completion.    Baseline:  Goal status: INITIAL  2.  Patient will be able to improve 2 min walk test distance by 50 feet or more with LRAD and no increase in pain.   Baseline:  Goal status: INITIAL  3.  Patient will be able to demonstrate hip/knee strength to 5/5 in order to maximize functional mobility, ambulation.  Baseline:  Goal status: INITIAL  4.  Patient will be able to stand for 20 minutes to do light home tasks with no more than minimal increase in lower back pain Baseline:  Goal status: INITIAL  5.  Patient will report rare instances of pain radiating into her left leg/thigh.  Baseline:  Goal status: INITIAL  6. Patient will report being able to walk in and out of  the store for < 15 min with no more than min increase in back pain.  Baseline:  Goal status:  INITIAL  PLAN:  PT FREQUENCY: 2x/week  PT DURATION: 8 weeks  PLANNED INTERVENTIONS: 97164- PT Re-evaluation, 97750- Physical Performance Testing, 97110-Therapeutic exercises, 97530- Therapeutic activity, [id number]- Neuromuscular re-education, 97535- Self Care, [id number]- Manual therapy, [id number]- Gait training, [phone]- Aquatic Therapy, [phone]- Electrical stimulation (unattended), [phone] (1-2 muscles), [id number] (3+ muscles)- Dry Needling, Patient/Family education, Balance training, Spinal mobilization, Cryotherapy, and Moist heat.  PLAN FOR NEXT SESSION: 2-minute walk,  test check home exercise program , core and hip strengthening as tolerated will likely be more comfortable in sitting or standing, modalities for pain Patient interested in [place], [place] 02/10/2024, 12:06 PM   Phone: [phone] Fax: [phone]

## 2024-02-13 ENCOUNTER — Other Ambulatory Visit: Payer: Self-pay

## 2024-02-14 ENCOUNTER — Ambulatory Visit: Admitting: Physical Therapy

## 2024-02-14 ENCOUNTER — Encounter: Payer: Self-pay | Admitting: Physical Therapy

## 2024-02-14 ENCOUNTER — Other Ambulatory Visit: Payer: Self-pay

## 2024-02-14 DIAGNOSIS — M5459 Other low back pain: Secondary | ICD-10-CM

## 2024-02-14 DIAGNOSIS — R262 Difficulty in walking, not elsewhere classified: Secondary | ICD-10-CM | POA: Diagnosis not present

## 2024-02-14 NOTE — Therapy (Signed)
 OUTPATIENT PHYSICAL THERAPY TREATMENT NOTE   Patient Name: Kimberly Frazier MRN: [id number] DOB:Oct 25, 1960, 63 y.o., female Today's Date: 02/14/2024  END OF SESSION:  PT End of Session - 02/14/24 1143     Visit Number 5    Number of Visits 16    Date for PT Re-Evaluation 03/16/24    Authorization Type [employer] Dual complete    PT Start Time 1145    PT Stop Time 1226    PT Time Calculation (min) 41 min    Activity Tolerance Patient tolerated treatment well    Behavior During Therapy WFL for tasks assessed/performed         Past Medical History:  Diagnosis Date   Adrenal adenoma, left    Arthritis    Cancer (HCC)    CKD (chronic kidney disease), stage II    Coronary artery disease 07-28-2018 followed [facility]  currently due to no insurance   per cardiac cath 02-06-2015 (positive mild lateral ishcemia on stress test)--- dLAD 80%,  mLAD 40%,  mPDA 80% (small vessel),  ostial D1 70%,  CFx with lumial irregarlities-- medical management   Depression    Diabetic neuropathy (HCC)    Diabetic retinopathy (HCC)    NPDR OU   Headache    History of Bell's palsy 07/2011   per pt residual facial pain on left side occasionally   History of cancer of vagina 1999   per pt completed radiation and chemo   History of sepsis 12/30/2017   positive blood culter fro E.coli   Hyperlipidemia    Hypertension    Hypertensive retinopathy    OU   Hypokalemia    Insulin  dependent type 2 diabetes mellitus, uncontrolled    followed by pcp---  A1c was 11.7 on 06-15-2018 in epic   Myocardial infarction (HCC)    10 yrs ago?   Nocturia    Peripheral neuropathy    PMB (postmenopausal bleeding)    Wears dentures    upper   Wears glasses    Past Surgical History:  Procedure Laterality Date   BREAST BIOPSY  2012   benign   CARDIAC CATHETERIZATION N/A 02/06/2015   Procedure: Left Heart Cath and Coronary Angiography;  Surgeon: Ezra GORMAN Shuck, MD;  Location: [facility] INVASIVE CV LAB;   Service: Cardiovascular;  Laterality: N/A;   CATARACT EXTRACTION Bilateral OD: 03/07/19, OS: 02/28/19   Dr. Roz   CATARACT EXTRACTION, BILATERAL     COLONOSCOPY     normal exam in [place], [place] abour 10-12 years ago   EYE SURGERY Bilateral OD: 03/07/19, OS: 02/28/19   Cat Sx - Dr. Roz   HYSTEROSCOPY WITH D & C N/A 08/01/2018   Procedure: DILATATION AND CURETTAGE /HYSTEROSCOPY;  Surgeon: Alger Gong, MD;  Location: [facility];  Service: Gynecology;  Laterality: N/A;   ROBOTIC ASSISTED TOTAL HYSTERECTOMY WITH BILATERAL SALPINGO OOPHERECTOMY N/A 11/28/2018   Procedure: XI ROBOTIC ASSISTED TOTAL HYSTERECTOMY WITH BILATERAL SALPINGO OOPHORECTOMY;  Surgeon: Eloy Herring, MD;  Location: [facility];  Service: Gynecology;  Laterality: N/A;   SENTINEL NODE BIOPSY N/A 11/28/2018   Procedure: SENTINEL NODE BIOPSY;  Surgeon: Eloy Herring, MD;  Location: [facility];  Service: Gynecology;  Laterality: N/A;   UMBILICAL HERNIA REPAIR  child   Patient Active Problem List   Diagnosis Date Noted   Low back pain 04/22/2023   Stage 3b chronic kidney disease (HCC) 08/06/2022   Fecal smearing 09/16/2020   Urinary incontinence, mixed 09/16/2020   Major depressive disorder, single  episode, mild (HCC) 09/16/2020   History of endometrial cancer 07/30/2020   New onset headache 12/21/2019   Cerebral vascular disease 12/21/2019   Migraine without aura and without status migrainosus, not intractable 11/12/2019   Hematuria 11/28/2018   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   History of radiation therapy 11/28/2018   Diabetes mellitus (HCC) 10/06/2018   Type 2 diabetes mellitus with hyperglycemia, with long-term current use of insulin  (HCC) 10/06/2018   Adrenal adenoma, left 06/15/2018   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   Postmenopausal vaginal bleeding 06/15/2018   Hyperkalemia    Diabetic peripheral neuropathy (HCC) 09/03/2016   CAD (coronary artery disease) 04/10/2015   Neck pain 06/20/2014    Severe obesity (BMI >= 40) (HCC) 09/20/2013   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   Hyperlipidemia 10/25/2012   Diabetes mellitus type 2, uncontrolled, with complications 07/26/2012   Essential hypertension 07/26/2012    PCP: [name] MD  REFERRING PROVIDER: [name], MD  REFERRING DIAG: M54.16 (ICD-10-CM) - Left lumbar radiculopathy  Rationale for Evaluation and Treatment: Rehabilitation  THERAPY DIAG:  Other low back pain  Difficulty in walking, not elsewhere classified  ONSET DATE: chronic  SUBJECTIVE:                                                                                                                                                                                           SUBJECTIVE STATEMENT: Pt reports that she felt good after the pool.  Little change in back pain so far.   EVAL: Patient reports to physical therapy with complaint of lower back pain extending to her left hip and thigh.  The pain has been present for years but is recently increased to the point where she spends most of the time laying in bed.  She has extreme difficulty standing even for short periods of time to do basic home tasks.  At times her knee buckles and even her left foot may catch on the rug.  Once in a while she will have leg numbness but she also is diabetic so not sure the source of that.  She avoids lifting for fear of injuring her back and can stand absolutely no longer than 20 minutes at a time.  She does not walk with a device at this time but she frequently holds onto furniture and walls to maintain stability.     PERTINENT HISTORY:  Diabetes, HTN, obesity neuropathy coronary artery disease chronic kidney disease  PAIN:  Are you having pain? Yes: NPRS scale: 8  Pain location: central low back , wrapping to glute  Pain description: aching, sore , burning Aggravating factors: standing, walking  Relieving factors: Tylenol , does not use heating pad or ice, sitting  temporarily   PRECAUTIONS: None  RED FLAGS: None   WEIGHT BEARING RESTRICTIONS: No  FALLS:  Has patient fallen in last 6 months? No Has fallen before but years ago.   LIVING ENVIRONMENT: Lives with: lives with their family Lives in: House/apartment Stairs: Yes: External: 4 steps; on right going up Has following equipment at home: None  OCCUPATION: Retired , Psychologist, forensic   PLOF: Independent with basic ADLs, Independent with household mobility without device, Needs assistance with homemaking, and Leisure: limited by her back pain, is bored.  She used to go out a lot more and visit her family.   PATIENT GOALS: Pain to be manageable, how I can reduce it myself   NEXT MD VISIT: after PT   OBJECTIVE:  Note: Objective measures were completed at Evaluation unless otherwise noted.  DIAGNOSTIC FINDINGS:  MRI 12/25/23: IMPRESSION: 1. L3-4: Minimal disc bulge. Mild facet osteoarthritis. No stenosis. 2. L4-5: Bilateral facet osteoarthritis with 1 mm of degenerative anterolisthesis. No apparent compressive stenosis of the canal or foramina. The facet arthritis could be a cause of back pain or referred facet syndrome pain. 3. L5-S1: Disc bulge. Mild facet osteoarthritis. Foraminal narrowing that could possibly affect either or both L5 nerves.  PATIENT SURVEYS:  Modified Oswestry: 70% (35/50)  Interpretation of scores: Score Category Description  0-20% Minimal Disability The patient can cope with most living activities. Usually no treatment is indicated apart from advice on lifting, sitting and exercise  21-40% Moderate Disability The patient experiences more pain and difficulty with sitting, lifting and standing. Travel and social life are more difficult and they may be disabled from work. Personal care, sexual activity and sleeping are not grossly affected, and the patient can usually be managed by conservative means  41-60% Severe Disability Pain remains the main problem in this  group, but activities of daily living are affected. These patients require a detailed investigation  61-80% Crippled Back pain impinges on all aspects of the patient's life. Positive intervention is required  81-100% Bed-bound  These patients are either bed-bound or exaggerating their symptoms  Bluford FORBES Zoe DELENA Karon DELENA, et al. Surgery versus conservative management of stable thoracolumbar fracture: the PRESTO feasibility RCT. [place] ([place]): [employer]; 2021 Nov. [facility] Technology Assessment, No. 25.62.) Appendix 3, Oswestry Disability Index category descriptors. Available from: FindJewelers.cz  Minimally Clinically Important Difference (MCID) = 12.8%  COGNITION: Overall cognitive status: Within functional limits for tasks assessed     SENSATION: WFL  POSTURE: flexed trunk   PALPATION: Generalized pain and soreness across the lower back bilaterally.  LUMBAR ROM:   AROM eval  Flexion Pain on L   Extension 50% Pain on L   Right lateral flexion Limited 25%   Left lateral flexion Limited 25%   Right rotation 25%  Left rotation 25%   (Blank rows = not tested)  LOWER EXTREMITY ROM:   WFL   Passive  Right eval Left eval  Hip flexion WFL WNL, but Painful  Hip extension    Hip abduction    Hip adduction    Hip internal rotation Tight  tight  Hip external rotation tight tight  Knee flexion    Knee extension    Ankle dorsiflexion    Ankle plantarflexion    Ankle inversion    Ankle eversion     (Blank rows = not tested)  LOWER EXTREMITY MMT:    MMT Right eval Left eval  Hip flexion 4 4-  Hip extension    Hip abduction    Hip adduction    Hip internal rotation    Hip external rotation    Knee flexion 5 4  Knee extension 5 4  Ankle dorsiflexion 5 5  Ankle plantarflexion 3 3  Ankle inversion    Ankle eversion     (Blank rows = not tested)  LUMBAR SPECIAL TESTS:  Straight leg raise test: Positive on L   FUNCTIONAL  TESTS:  5 times sit to stand: 32 sec no UEs  Hip weakness evident with standing marching  GAIT: Distance walked: 150 Assistive device utilized: None Level of assistance: Modified independence Comments: Uses the wall for support  TREATMENT DATE:  [facility] Adult PT Treatment:                                                DATE: 02/14/24 Therapeutic Exercise: nu-step L7 x 3m while taking subjective and planning session with patient - tiring  Seated hamstring stretch 2x30 BIL  Therapeutic Activity: Sit to stand - raised table with UE support - 2x5 Walking laps for endurance 1' bouts 1' AMRAP STS from raised table 2x (8x average) 2 MWT   TREATMENT 02/08/24:  Aquatic therapy at [facility]- [address] - therapeutic pool temp 92 degrees Pt enters building independently.  Treatment took place in water  3.8 to  4 ft 8 in.feet deep depending upon activity.  Pt entered and exited the pool via stair and handrails    Aquatic Therapy:  Water  walking for warm up fwd/lat  Mini squat  Hip abd Hs curl Heel raise Bil shoulder ext - Blue DB Yellow noodle stomp - 15x  Pt requires the buoyancy of water  for active assisted exercises with buoyancy supported for strengthening and AROM exercises. Hydrostatic pressure also supports joints by unweighting joint load by at least 50 % in 3-4 feet depth water . 80% in chest to neck deep water . Water  will provide assistance with movement using the current and laminar flow while the buoyancy reduces weight bearing. Pt requires the viscosity of the water  for resistance with strengthening exercises.  [facility] Adult PT Treatment:                                                DATE: 01/24/24 Therapeutic Exercise:  nu-step L4 67m while taking subjective and planning session with patient - tiring  Seated hamstring stretch Lower trunk rotation Supine clam - alternating - Blue TB - 2' x2 Supine hip adduction squeeze - 5'' holds PPT Sit to stand - raised table with  UE support - 3x3 Pt education regarding exercise, DOMS, movement snacks  HOME EXERCISE PROGRAM: Access Code: [email or web address] URL: [email or web address] Date: 01/24/2024 Prepared by: Helene Gasmen  Exercises - Seated Hamstring Stretch  - 2 x daily - 7 x weekly - 1 sets - 3-5 reps - 30 hold - Supine Lower Trunk Rotation  - 2 x daily - 7 x weekly - 1 sets - 10 reps - 10 hold - Sit to Stand with Arm Reach Toward Target  - 5 x daily - 7 x weekly - 2 sets - 3 reps - Hooklying Isometric Clamshell  - 1 x daily - 7 x weekly - 3 sets - 10 reps - Supine Hip Adduction Isometric with Ball  - 1 x daily - 7 x weekly - 2 sets - 10 reps - 5'' hold - Supine Posterior Pelvic Tilt  - 2 x daily - 7 x weekly - 2 sets - 10 reps - 5-10 second hold hold  ASSESSMENT:  CLINICAL IMPRESSION: Concentrated on endurance in walking and strengthening with sit to stands.  Able to complete 200' 2 MWT before rest.  Encouraged progressive walking program at home and STS to fatigue 2-3x/day.    OBJECTIVE IMPAIRMENTS: cardiopulmonary status limiting activity, decreased activity tolerance, decreased balance, decreased endurance, decreased knowledge of condition, decreased mobility, difficulty walking, decreased ROM, decreased strength, increased fascial restrictions, impaired flexibility, improper body mechanics, obesity, and pain.   ACTIVITY LIMITATIONS: carrying, lifting, bending, sitting, standing, squatting, stairs, transfers, bed mobility, and locomotion level  PARTICIPATION LIMITATIONS: cleaning, laundry, interpersonal relationship, shopping, and community activity  PERSONAL FACTORS: Social background, Time since onset of injury/illness/exacerbation, and 3+ comorbidities: Obesity, chronic pain and metabolic dysfunction are also affecting patient's functional outcome.   REHAB POTENTIAL:  Good  CLINICAL DECISION MAKING: Evolving/moderate complexity  EVALUATION COMPLEXITY: Moderate   GOALS: Goals reviewed with patient? Yes  SHORT TERM GOALS: Target date: 02/18/2024   Patient will be able to show independence for initial HEP to include posture, core and hip strength and stability.   Baseline: Goal status: MET  2.  Patient will be able to complete 2 min walk test  Baseline: 9/16: 200' rest after 1' 10'' Goal status: ongoing   3.  Patient will demonstrate transfers from the bed which do not increase her back pain  Baseline: Goal status: Ongoing   LONG TERM GOALS: Target date: 03/17/2024    Patient will be independent with final HEP upon discharge from PT and report consistent benefit following exercise completion.    Baseline:  Goal status: INITIAL  2.  Patient will be able to improve 2 min walk test distance by 50 feet or more with LRAD and no increase in pain.   Baseline:  Goal status: INITIAL  3.  Patient will be able to demonstrate hip/knee strength to 5/5 in order to maximize functional mobility, ambulation.  Baseline:  Goal status: INITIAL  4.  Patient will be able to stand for 20 minutes to do light home tasks with no more than minimal increase in lower back pain Baseline:  Goal status: INITIAL  5.  Patient will report rare instances of pain radiating into her left leg/thigh.  Baseline:  Goal status: INITIAL  6. Patient will report being able to walk in and out of the store for < 15 min with no more than min increase in back pain.  Baseline:  Goal status: INITIAL  PLAN:  PT FREQUENCY: 2x/week  PT DURATION: 8 weeks  PLANNED INTERVENTIONS: [id number]- PT Re-evaluation, 97750- Physical Performance Testing, 97110-Therapeutic exercises, 97530- Therapeutic activity, [id number]- Neuromuscular re-education,  02464- Self Care, [id number]- Manual therapy, [id number]- Gait training, [phone]- Aquatic Therapy, [phone]- Electrical stimulation (unattended), [phone] (1-2 muscles),  [phone] (3+ muscles)- Dry Needling, Patient/Family education, Balance training, Spinal mobilization, Cryotherapy, and Moist heat.  PLAN FOR NEXT SESSION: 2-minute walk,  test check home exercise program , core and hip strengthening as tolerated will likely be more comfortable in sitting or standing, modalities for pain Patient interested in [address] [place], PT 02/14/2024, 12:29 PM   Phone: [phone] Fax: [phone]

## 2024-02-15 ENCOUNTER — Other Ambulatory Visit: Payer: Self-pay

## 2024-02-16 ENCOUNTER — Ambulatory Visit

## 2024-02-16 ENCOUNTER — Other Ambulatory Visit: Payer: Self-pay

## 2024-02-16 ENCOUNTER — Other Ambulatory Visit: Payer: Self-pay | Admitting: Internal Medicine

## 2024-02-16 MED ORDER — INSULIN LISPRO (1 UNIT DIAL) 100 UNIT/ML (KWIKPEN)
28.0000 [IU] | PEN_INJECTOR | Freq: Two times a day (BID) | SUBCUTANEOUS | 1 refills | Status: DC
  Filled 2024-02-16: qty 60, 107d supply, fill #0
  Filled 2024-02-17: qty 60, 88d supply, fill #0
  Filled 2024-05-08: qty 45, 80d supply, fill #1
  Filled 2024-05-08: qty 60, 88d supply, fill #1

## 2024-02-16 NOTE — Progress Notes (Shared)
 [facility] - Clinic Note  02/22/2024     CHIEF COMPLAINT Patient presents for No chief complaint on file.   HISTORY OF PRESENT ILLNESS: Kimberly Frazier is a 63 y.o. female who presents to the clinic today for:    Patient late to f/u 9 weeks instead of 7 weeks. States vision seems the same OU.  Referring physician: [name], MD [address] [place],  [place] [address]  HISTORICAL INFORMATION:   Selected notes from the MEDICAL RECORD NUMBER Referred by Dr. Oneil Platts for concern of CME    CURRENT MEDICATIONS: Current Outpatient Medications (Ophthalmic Drugs)  Medication Sig   brimonidine  (ALPHAGAN ) 0.15 % ophthalmic solution Place 1 drop into both eyes 2 (two) times daily.   brimonidine  (ALPHAGAN ) 0.2 % ophthalmic solution Place 1 drop into both eyes 2 (two) times daily.   dorzolamide -timolol  (COSOPT ) 2-0.5 % ophthalmic solution Place 1 drop into both eyes 2 (two) times daily.   ketorolac  (ACULAR ) 0.5 % ophthalmic solution Place 1 drop into both eyes 4 (four) times daily.   prednisoLONE  acetate (PRED FORTE ) 1 % ophthalmic suspension Place 1 drop into both eyes daily.   No current facility-administered medications for this visit. (Ophthalmic Drugs)   Current Outpatient Medications (Other)  Medication Sig   ezetimibe  (ZETIA ) 10 MG tablet Take 1 tablet (10 mg total) by mouth daily. PATIENT MUST CALL AND SCHEDULE OVERDUE ANNUAL APPOINTMENT FOR FURTHER REFILLS.FIRST ATTEMPT   Accu-Chek Softclix Lancets lancets Use to check blood sugar 3 times daily.   acetaminophen  (TYLENOL ) 500 MG tablet Take 2 tablets (1,000 mg total) by mouth every 6 (six) hours as needed.   amLODipine  (NORVASC ) 10 MG tablet Take 1 tablet (10 mg total) by mouth daily.   aspirin  EC 81 MG tablet Take 1 tablet (81 mg total) daily by mouth.   atorvastatin  (LIPITOR) 80 MG tablet Take 1 tablet (80 mg total) by mouth once daily. (PLEASE SCHEDULE AN APPT FOR FUTURE REFILLS)    Blood Glucose Monitoring Suppl (ACCU-CHEK GUIDE) w/Device KIT 1 kit by Does not apply route in the morning, at noon, and at bedtime. Use to check blood sugar 3 times daily.   carvedilol  (COREG ) 25 MG tablet Take 1 tablet (25 mg total) by mouth 2 (two) times daily with a meal.   Continuous Blood Gluc Receiver (FREESTYLE LIBRE 2 READER) DEVI Use as directed   Continuous Blood Gluc Sensor (FREESTYLE LIBRE SENSOR SYSTEM) MISC Change sensor every 2 weeks   diclofenac  Sodium (VOLTAREN ) 1 % GEL Apply 2 g topically 2 (two) times daily as needed.   glimepiride  (AMARYL ) 2 MG tablet Take 1 tablet (2 mg total) by mouth daily before breakfast.   glucose blood (ACCU-CHEK GUIDE) test strip Use to check blood sugar 3 times daily.   insulin  glargine (LANTUS  SOLOSTAR) 100 UNIT/ML Solostar Pen Inject 64 Units into the skin daily.   insulin  lispro (HUMALOG  KWIKPEN) 100 UNIT/ML KwikPen Inject 28 Units into the skin in the morning and at bedtime. Increase to 34 units into the skin twice daily after 1 week if blood sugars remain above 200.   Insulin  Pen Needle (BD PEN NEEDLE NANO U/F) 32G X 4 MM MISC Use as directed.   isosorbide  mononitrate (IMDUR ) 60 MG 24 hr tablet Take 1 tablet (60 mg total) by mouth daily.   nitroGLYCERIN  (NITROSTAT ) 0.4 MG SL tablet Place 1 tab under tongue for chest pain.  May repeat after 5 minutes x 2.  DO  NOT TAKE MORE THAN 3 TABS DURING AN EPISODE OF CHEST PAIN.Pt must schedule an overdue followup appt with Cardiology for any more refills. [phone] 1st attempt Thank You   omeprazole  (PRILOSEC) 20 MG capsule Take 1 capsule (20 mg total) by mouth daily.   potassium chloride  (KLOR-CON ) 10 MEQ tablet Take 2 tablets (20 mEq total) by mouth daily.   tiZANidine  (ZANAFLEX ) 4 MG tablet Take 1 tablet (4 mg total) by mouth every 8 (eight) hours as needed for muscle spasms.   No current facility-administered medications for this visit. (Other)   REVIEW OF SYSTEMS:   ALLERGIES Allergies  Allergen  Reactions   Jardiance  [Empagliflozin ] Other (See Comments)    Frequent yeast infection   Metformin  And Related Diarrhea   PAST MEDICAL HISTORY Past Medical History:  Diagnosis Date   Adrenal adenoma, left    Arthritis    Cancer (HCC)    CKD (chronic kidney disease), stage II    Coronary artery disease 07-28-2018 followed [facility]  currently due to no insurance   per cardiac cath 02-06-2015 (positive mild lateral ishcemia on stress test)--- dLAD 80%,  mLAD 40%,  mPDA 80% (small vessel),  ostial D1 70%,  CFx with lumial irregarlities-- medical management   Depression    Diabetic neuropathy (HCC)    Diabetic retinopathy (HCC)    NPDR OU   Headache    History of Bell's palsy 07/2011   per pt residual facial pain on left side occasionally   History of cancer of vagina 1999   per pt completed radiation and chemo   History of sepsis 12/30/2017   positive blood culter fro E.coli   Hyperlipidemia    Hypertension    Hypertensive retinopathy    OU   Hypokalemia    Insulin  dependent type 2 diabetes mellitus, uncontrolled    followed by pcp---  A1c was 11.7 on 06-15-2018 in epic   Myocardial infarction [facility])    10 yrs ago?   Nocturia    Peripheral neuropathy    PMB (postmenopausal bleeding)    Wears dentures    upper   Wears glasses    Past Surgical History:  Procedure Laterality Date   BREAST BIOPSY  2012   benign   CARDIAC CATHETERIZATION N/A 02/06/2015   Procedure: Left Heart Cath and Coronary Angiography;  Surgeon: Ezra GORMAN Shuck, MD;  Location: [facility] INVASIVE CV LAB;  Service: Cardiovascular;  Laterality: N/A;   CATARACT EXTRACTION Bilateral OD: 03/07/19, OS: 02/28/19   Dr. Roz   CATARACT EXTRACTION, BILATERAL     COLONOSCOPY     normal exam in [place], [place] abour 10-12 years ago   EYE SURGERY Bilateral OD: 03/07/19, OS: 02/28/19   Cat Sx - Dr. Roz   HYSTEROSCOPY WITH D & C N/A 08/01/2018   Procedure: DILATATION AND CURETTAGE /HYSTEROSCOPY;   Surgeon: Alger Gong, MD;  Location: [facility];  Service: Gynecology;  Laterality: N/A;   ROBOTIC ASSISTED TOTAL HYSTERECTOMY WITH BILATERAL SALPINGO OOPHERECTOMY N/A 11/28/2018   Procedure: XI ROBOTIC ASSISTED TOTAL HYSTERECTOMY WITH BILATERAL SALPINGO OOPHORECTOMY;  Surgeon: Eloy Herring, MD;  Location: [facility] ORS;  Service: Gynecology;  Laterality: N/A;   SENTINEL NODE BIOPSY N/A 11/28/2018   Procedure: SENTINEL NODE BIOPSY;  Surgeon: Eloy Herring, MD;  Location: [facility];  Service: Gynecology;  Laterality: N/A;   UMBILICAL HERNIA REPAIR  child   FAMILY HISTORY Family History  Problem Relation Age of Onset   Heart disease Mother    Hypertension Mother  Diabetes Mother    Cancer Father        hodgkins lymphoma   Heart disease Sister        heart attack   Diabetes Sister    Colon cancer Brother 52   Diabetes Maternal Aunt    Diabetes Maternal Uncle    Cancer Other        parent   Diabetes Other        parent   Heart disease Other        parent   Hyperlipidemia Other        parent   Hypertension Other        parent   Arthritis Other        parent   Breast cancer Neg Hx    Colon polyps Neg Hx    Esophageal cancer Neg Hx    Rectal cancer Neg Hx    Stomach cancer Neg Hx    SOCIAL HISTORY Social History   Tobacco Use   Smoking status: Never   Smokeless tobacco: Never  Vaping Use   Vaping status: Never Used  Substance Use Topics   Alcohol  use: Not Currently   Drug use: Not Currently       OPHTHALMIC EXAM:  Not recorded    IMAGING AND PROCEDURES  Imaging and Procedures for 02/22/2024          ASSESSMENT/PLAN:  No diagnosis found.  1-4. Moderate Non-proliferative diabetic retinopathy, OU OD with combination DME/CME  - last A1c 8.7 (11.12.24), 8.2 (03.08.24); 9.6 (08.07.23), 10.1 (1.31.23)  - pt has stopped Ozempic  recently due to change in vision - h/o delayed to follow up from September 2021 to April 2022--7 mos instead of 4 wks**              - exam shows scattered IRH, edema and tortuous blood vessels OU - FA (10.23.20) shows leaking MA OU and paracentral petaloid hyperfluorecence OD --> CME/[place] - s/p IVA OD #1 (11.06.20), #2 (12.18.20), #3 (02.24.21), #4 (03.24.21), #5 (04.21.21), #6 (5.26.21), #7 (07.26.21), #8 (08.25.21), #9 (09.23.21), #10 (04.26.22), #11 (01.08.25), #12 (02.26.25), #13 (04.16.25), #14 (06.04.25) - s/p IVA OS #1 (10.23.20), #2 (11.18.20), #3 (12.18.20), #4 (02.24.21), #5 (03.24.21) -- IVA resistance  ============================= - s/p IVE OD #1 (05.25.22), #2 (07.01.22), #3 (07.29.22), #4 (08.29.22 - JDM), #5 (09.26.22), #6 (10.24.22), #7 (11.21.22), #8 (12.19.22), #9 (01.23.23), #10 (02.20.23), #11 (03.22.23), #12 (04.19.23), #13 (05.17.23), #14 (06.14.23), #15 (08.04.23), #16 (09.01.23), #17 (10.10.23), #18 (11.14.23), #19 (12.19.23), #20 (02.13.24), #21 (03.20.24), #22 (03.20.24), #23 (03.20.24), #24 (04.24.24) #25(06.28.24), #26 (08.16.24), #27 (10.02.24), #28 (11.20.24) - s/p IVE OS #1 (04.21.21), #2 (5.26.21), #3 (07.26.21), #4 (08.25.21), #5 (09.23.21), #6 (04.26.22), #7 (05.25.22), #8 (07.01.22), #9 (07.29.22), #10 (08.29.22 - JDM), #11 (09.26.22), #12 (10.24.22) -- IVE resistance ============================= - s/p IVV OS #1 (11.21.22, sample), #2 (12.19.22), #3 (01.23.23), #4 (02.20.23), #5 (03.22.23), #6 (04.19.23), #7 (05.17.23), #8 (06.14.23, sample), #9 (08.04.23), #10 (9.01.23), #11 (10.10.23), #12 (11.14.23), #13 (12.19.23), #14 (02.13.24), #15 (03.20.24) #16(3.20.24), #17 (03.20.24), #18 (04.24.24) #19(06.28.24), #20 (08.16.24), #21 (10.02.24), #22 (11.20.24), #23 (01.08.25 -- SAMPLE), #24 (02.26.25 -- SAMPLE), #25 (04.16.25 - SAMPLE), #26 (06.04.25--SAMPLE) - BCVA OD 20/20 -- stable; OS stable at 20/150  - OCT today: OD: Persistent IRF/cystic changes inferior macula and fovea--slightly increased; OS: Persistent IRF/IRHM inferior and temporal mac and fovea -- slightly improved, +central  ORA at 9 weeks - recommend IVA OD #15 and IVV OS #27 [sample] today, 08.06.25 with follow up dec to 7  weeks - [employer] funding unavailable at this time -- pt wishes to proceed w/ injections             - RBA of procedure discussed, questions answered - Vabysmo  OS consent form signed and scanned on 11.20.24 - Avastin  OD informed consent signed and scanned on 01.08.25 - see procedure note             - continue Ketorolac  QID OU             - f/u in 7 weeks, DFE, OCT, possible injection OU   5,6. Hypertensive retinopathy OU             - discussed importance of tight BP control             - continue to monitor   7. Pseudophakia OU            - s/p CE/IOL OU [place]), OD: 03/07/2019, OS: 02/28/2019             - IOLs in good position             - CME OU as above             - continue to monitor   8. Ocular Hypertension OU             - IOP: 21, 19 - h/o steroid response             - cont Cosopt  BID OU and Brimonidine  BID OU  9. Dry eyes OU - recommend artificial tears and lubricating ointment as needed   Ophthalmic Meds Ordered this visit:  No orders of the defined types were placed in this encounter.    No follow-ups on file.  There are no Patient Instructions on file for this visit.  This document serves as a record of services personally performed by Redell JUDITHANN Hans, MD, PhD. It was created on their behalf by Almetta Pesa, an ophthalmic technician. The creation of this record is the provider's dictation and/or activities during the visit.    Electronically signed by: Almetta Pesa, OA, 02/16/24  1:08 PM   Redell JUDITHANN Hans, M.D., Ph.D. Diseases & Surgery of the Retina and Vitreous [facility]    Abbreviations: M myopia (nearsighted); A astigmatism; H hyperopia (farsighted); P presbyopia; Mrx spectacle prescription;  CTL contact lenses; OD right eye; OS left eye; OU both eyes  XT exotropia; ET esotropia; PEK punctate epithelial  keratitis; PEE punctate epithelial erosions; DES dry eye syndrome; MGD meibomian gland dysfunction; ATs artificial tears; PFAT's preservative free artificial tears; NSC nuclear sclerotic cataract; PSC posterior subcapsular cataract; ERM epi-retinal membrane; PVD posterior vitreous detachment; RD retinal detachment; DM diabetes mellitus; DR diabetic retinopathy; NPDR non-proliferative diabetic retinopathy; PDR proliferative diabetic retinopathy; CSME clinically significant macular edema; DME diabetic macular edema; dbh dot blot hemorrhages; CWS cotton wool spot; POAG primary open angle glaucoma; C/D cup-to-disc ratio; HVF humphrey visual field; GVF goldmann visual field; OCT optical coherence tomography; IOP intraocular pressure; BRVO Branch retinal vein occlusion; CRVO central retinal vein occlusion; CRAO central retinal artery occlusion; BRAO branch retinal artery occlusion; RT retinal tear; SB scleral buckle; PPV pars plana vitrectomy; VH Vitreous hemorrhage; PRP panretinal laser photocoagulation; IVK intravitreal kenalog; VMT vitreomacular traction; MH Macular hole;  NVD neovascularization of the disc; NVE neovascularization elsewhere; AREDS age related eye disease study; ARMD age related macular degeneration; POAG primary open angle glaucoma; EBMD epithelial/anterior basement membrane dystrophy; ACIOL anterior chamber  intraocular lens; IOL intraocular lens; PCIOL posterior chamber intraocular lens; Phaco/IOL phacoemulsification with intraocular lens placement; PRK photorefractive keratectomy; LASIK laser assisted in situ keratomileusis; HTN hypertension; DM diabetes mellitus; COPD chronic obstructive pulmonary disease

## 2024-02-16 NOTE — Therapy (Incomplete)
 OUTPATIENT PHYSICAL THERAPY TREATMENT NOTE   Patient Name: Kimberly Frazier MRN: [id number] DOB:May 12, 1961, 63 y.o., female Today's Date: 02/16/2024  END OF SESSION:   Past Medical History:  Diagnosis Date   Adrenal adenoma, left    Arthritis    Cancer (HCC)    CKD (chronic kidney disease), stage II    Coronary artery disease 07-28-2018 followed [facility])  currently due to no insurance   per cardiac cath 02-06-2015 (positive mild lateral ishcemia on stress test)--- dLAD 80%,  mLAD 40%,  mPDA 80% (small vessel),  ostial D1 70%,  CFx with lumial irregarlities-- medical management   Depression    Diabetic neuropathy (HCC)    Diabetic retinopathy (HCC)    NPDR OU   Headache    History of Bell's palsy 07/2011   per pt residual facial pain on left side occasionally   History of cancer of vagina 1999   per pt completed radiation and chemo   History of sepsis 12/30/2017   positive blood culter fro E.coli   Hyperlipidemia    Hypertension    Hypertensive retinopathy    OU   Hypokalemia    Insulin  dependent type 2 diabetes mellitus, uncontrolled    followed by pcp---  A1c was 11.7 on 06-15-2018 in epic   Myocardial infarction [facility])    10 yrs ago?   Nocturia    Peripheral neuropathy    PMB (postmenopausal bleeding)    Wears dentures    upper   Wears glasses    Past Surgical History:  Procedure Laterality Date   BREAST BIOPSY  2012   benign   CARDIAC CATHETERIZATION N/A 02/06/2015   Procedure: Left Heart Cath and Coronary Angiography;  Surgeon: Ezra GORMAN Shuck, MD;  Location: [facility] INVASIVE CV LAB;  Service: Cardiovascular;  Laterality: N/A;   CATARACT EXTRACTION Bilateral OD: 03/07/19, OS: 02/28/19   Dr. Roz   CATARACT EXTRACTION, BILATERAL     COLONOSCOPY     normal exam in [place], [place] abour 10-12 years ago   EYE SURGERY Bilateral OD: 03/07/19, OS: 02/28/19   Cat Sx - Dr. Roz   HYSTEROSCOPY WITH D & C N/A 08/01/2018   Procedure: DILATATION AND  CURETTAGE /HYSTEROSCOPY;  Surgeon: Alger Gong, MD;  Location: [facility];  Service: Gynecology;  Laterality: N/A;   ROBOTIC ASSISTED TOTAL HYSTERECTOMY WITH BILATERAL SALPINGO OOPHERECTOMY N/A 11/28/2018   Procedure: XI ROBOTIC ASSISTED TOTAL HYSTERECTOMY WITH BILATERAL SALPINGO OOPHORECTOMY;  Surgeon: Eloy Herring, MD;  Location: [facility];  Service: Gynecology;  Laterality: N/A;   SENTINEL NODE BIOPSY N/A 11/28/2018   Procedure: SENTINEL NODE BIOPSY;  Surgeon: Eloy Herring, MD;  Location: [facility];  Service: Gynecology;  Laterality: N/A;   UMBILICAL HERNIA REPAIR  child   Patient Active Problem List   Diagnosis Date Noted   Low back pain 04/22/2023   Stage 3b chronic kidney disease (HCC) 08/06/2022   Fecal smearing 09/16/2020   Urinary incontinence, mixed 09/16/2020   Major depressive disorder, single episode, mild (HCC) 09/16/2020   History of endometrial cancer 07/30/2020   New onset headache 12/21/2019   Cerebral vascular disease 12/21/2019   Migraine without aura and without status migrainosus, not intractable 11/12/2019   Hematuria 11/28/2018   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   History of radiation therapy 11/28/2018   Diabetes mellitus (HCC) 10/06/2018   Type 2 diabetes mellitus with hyperglycemia, with long-term current use of insulin  (HCC) 10/06/2018   Adrenal adenoma, left 06/15/2018   History of cancer of vagina  06/15/2018   Postmenopausal vaginal bleeding 06/15/2018   Hyperkalemia    Diabetic peripheral neuropathy (HCC) 09/03/2016   CAD (coronary artery disease) 04/10/2015   Neck pain 06/20/2014   Severe obesity (BMI >= 40) (HCC) 09/20/2013   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  ONSET DATE: chronic  SUBJECTIVE:                                                                                                                                                                                           SUBJECTIVE STATEMENT: Pt reports that she felt good after the pool.  Little change in back pain so far.   EVAL: Patient reports to physical therapy with complaint of lower back pain extending to her left hip and thigh.  The pain has been present for years but is recently increased to the point where she spends most of the time laying in bed.  She has extreme difficulty standing even for short periods of time to do basic home tasks.  At times her knee buckles and even her left foot may catch on the rug.  Once in a while she will have leg numbness but she also is diabetic so not sure the source of that.  She avoids lifting for fear of injuring her back and can stand absolutely no longer than 20 minutes at a time.  She does not walk with a device at this time but she frequently holds onto furniture and walls to maintain stability.     PERTINENT HISTORY:  Diabetes, HTN, obesity neuropathy coronary artery disease chronic kidney disease  PAIN:  Are you having pain? Yes: NPRS scale: 8  Pain location: central low back , wrapping to glute  Pain description: aching, sore , burning Aggravating factors: standing, walking  Relieving factors: Tylenol , does not use heating pad or ice, sitting temporarily   PRECAUTIONS: None  RED FLAGS: None   WEIGHT BEARING RESTRICTIONS: No  FALLS:  Has patient fallen in last 6 months? No Has fallen before but years ago.   LIVING ENVIRONMENT: Lives with: lives with their family Lives in: House/apartment Stairs: Yes: External: 4 steps; on right going up Has following equipment at home: None  OCCUPATION: Retired , Psychologist, forensic   PLOF:  Independent with basic ADLs, Independent with household mobility without device, Needs assistance  with homemaking, and Leisure: limited by her back pain, is bored.  She used to go out a lot more and visit her family.   PATIENT GOALS: Pain to be manageable, how I can reduce it myself   NEXT MD VISIT: after PT   OBJECTIVE:  Note: Objective measures were completed at Evaluation unless otherwise noted.  DIAGNOSTIC FINDINGS:  MRI 12/25/23: IMPRESSION: 1. L3-4: Minimal disc bulge. Mild facet osteoarthritis. No stenosis. 2. L4-5: Bilateral facet osteoarthritis with 1 mm of degenerative anterolisthesis. No apparent compressive stenosis of the canal or foramina. The facet arthritis could be a cause of back pain or referred facet syndrome pain. 3. L5-S1: Disc bulge. Mild facet osteoarthritis. Foraminal narrowing that could possibly affect either or both L5 nerves.  PATIENT SURVEYS:  Modified Oswestry: 70% (35/50)  Interpretation of scores: Score Category Description  0-20% Minimal Disability The patient can cope with most living activities. Usually no treatment is indicated apart from advice on lifting, sitting and exercise  21-40% Moderate Disability The patient experiences more pain and difficulty with sitting, lifting and standing. Travel and social life are more difficult and they may be disabled from work. Personal care, sexual activity and sleeping are not grossly affected, and the patient can usually be managed by conservative means  41-60% Severe Disability Pain remains the main problem in this group, but activities of daily living are affected. These patients require a detailed investigation  61-80% Crippled Back pain impinges on all aspects of the patient's life. Positive intervention is required  81-100% Bed-bound  These patients are either bed-bound or exaggerating their symptoms  Bluford FORBES Zoe DELENA Karon DELENA, et al. Surgery versus conservative management of stable thoracolumbar  fracture: the PRESTO feasibility RCT. [place] ([place]): [employer]; 2021 Nov. [facility] Technology Assessment, No. 25.62.) Appendix 3, Oswestry Disability Index category descriptors. Available from: FindJewel[employer].cz  Minimally Clinically Important Difference (MCID) = 12.8%  COGNITION: Overall cognitive status: Within functional limits for tasks assessed     SENSATION: WFL  POSTURE: flexed trunk   PALPATION: Generalized pain and soreness across the lower back bilaterally.  LUMBAR ROM:   AROM eval  Flexion Pain on L   Extension 50% Pain on L   Right lateral flexion Limited 25%   Left lateral flexion Limited 25%   Right rotation 25%  Left rotation 25%   (Blank rows = not tested)  LOWER EXTREMITY ROM:   WFL   Passive  Right eval Left eval  Hip flexion WFL WNL, but Painful  Hip extension    Hip abduction    Hip adduction    Hip internal rotation Tight  tight  Hip external rotation tight tight  Knee flexion    Knee extension    Ankle dorsiflexion    Ankle plantarflexion    Ankle inversion    Ankle eversion     (Blank rows = not tested)  LOWER EXTREMITY MMT:    MMT Right eval Left eval  Hip flexion 4 4-  Hip extension    Hip abduction    Hip adduction    Hip internal rotation    Hip external rotation    Knee flexion 5 4  Knee extension 5 4  Ankle dorsiflexion 5 5  Ankle plantarflexion 3 3  Ankle inversion    Ankle eversion     (Blank rows = not tested)  LUMBAR SPECIAL TESTS:  Straight leg raise test: Positive on L   FUNCTIONAL TESTS:  5 times sit to stand: 32 sec  no UEs  Hip weakness evident with standing marching  GAIT: Distance walked: 150 Assistive device utilized: None Level of assistance: Modified independence Comments: Uses the wall for support  TREATMENT DATE:  [facility] Adult PT Treatment:                                                DATE: 02/16/24 Aquatic therapy at [facility]- [address] -  therapeutic pool temp 92 degrees Pt enters building independently.  Treatment took place in water  3.8 to  4 ft 8 in.feet deep depending upon activity.  Pt entered and exited the pool via stair and handrails    Aquatic Therapy:  Water  walking for warm up fwd/lat  Mini squat  Hip abd Hs curl Heel raise Bil shoulder ext - Blue DB Yellow noodle stomp - 15x  Pt requires the buoyancy of water  for active assisted exercises with buoyancy supported for strengthening and AROM exercises. Hydrostatic pressure also supports joints by unweighting joint load by at least 50 % in 3-4 feet depth water . 80% in chest to neck deep water . Water  will provide assistance with movement using the current and laminar flow while the buoyancy reduces weight bearing. Pt requires the viscosity of the water  for resistance with strengthening exercises.  [facility] Adult PT Treatment:                                                DATE: 02/14/24 Therapeutic Exercise: nu-step L7 x 104m while taking subjective and planning session with patient - tiring  Seated hamstring stretch 2x30 BIL  Therapeutic Activity: Sit to stand - raised table with UE support - 2x5 Walking laps for endurance 1' bouts 1' AMRAP STS from raised table 2x (8x average) 2 MWT   TREATMENT 02/08/24:  Aquatic therapy at [facility]- [address] - therapeutic pool temp 92 degrees Pt enters building independently.  Treatment took place in water  3.8 to  4 ft 8 in.feet deep depending upon activity.  Pt entered and exited the pool via stair and handrails    Aquatic Therapy:  Water  walking for warm up fwd/lat  Mini squat  Hip abd Hs curl Heel raise Bil shoulder ext - Blue DB Yellow noodle stomp - 15x  Pt requires the buoyancy of water  for active assisted exercises with buoyancy supported for strengthening and AROM exercises. Hydrostatic pressure also supports joints by unweighting joint load by at least 50 % in 3-4 feet depth water . 80% in chest to  neck deep water . Water  will provide assistance with movement using the current and laminar flow while the buoyancy reduces weight bearing. Pt requires the viscosity of the water  for resistance with strengthening exercises.  HOME EXERCISE PROGRAM: Access Code: [email or web address] URL: [email or web address] Date: 01/24/2024 Prepared by: Helene Gasmen  Exercises - Seated Hamstring Stretch  - 2 x daily - 7 x weekly - 1 sets - 3-5 reps - 30 hold - Supine Lower Trunk Rotation  - 2 x daily - 7 x weekly - 1 sets - 10 reps - 10 hold - Sit to Stand with Arm Reach Toward Target  - 5 x daily - 7 x weekly - 2 sets - 3 reps - Hooklying Isometric Clamshell  - 1 x daily - 7 x weekly - 3 sets - 10 reps - Supine Hip Adduction Isometric with Ball  - 1 x daily - 7 x weekly - 2 sets - 10 reps - 5'' hold - Supine Posterior Pelvic Tilt  - 2 x daily - 7 x weekly - 2 sets - 10 reps - 5-10 second hold hold  ASSESSMENT:  CLINICAL IMPRESSION: ***  Concentrated on endurance in walking and strengthening with sit to stands.  Able to complete 200' 2 MWT before rest.  Encouraged progressive walking program at home and STS to fatigue 2-3x/day.    OBJECTIVE IMPAIRMENTS: cardiopulmonary status limiting activity, decreased activity tolerance, decreased balance, decreased endurance, decreased knowledge of condition, decreased mobility, difficulty walking, decreased ROM, decreased strength, increased fascial restrictions, impaired flexibility, improper body mechanics, obesity, and pain.   ACTIVITY LIMITATIONS: carrying, lifting, bending, sitting, standing, squatting, stairs, transfers, bed mobility, and locomotion level  PARTICIPATION LIMITATIONS: cleaning, laundry, interpersonal relationship, shopping, and community activity  PERSONAL FACTORS: Social background, Time since onset of  injury/illness/exacerbation, and 3+ comorbidities: Obesity, chronic pain and metabolic dysfunction are also affecting patient's functional outcome.   REHAB POTENTIAL: Good  CLINICAL DECISION MAKING: Evolving/moderate complexity  EVALUATION COMPLEXITY: Moderate   GOALS: Goals reviewed with patient? Yes  SHORT TERM GOALS: Target date: 02/18/2024   Patient will be able to show independence for initial HEP to include posture, core and hip strength and stability.   Baseline: Goal status: MET  2.  Patient will be able to complete 2 min walk test  Baseline: 9/16: 200' rest after 1' 10'' Goal status: ongoing   3.  Patient will demonstrate transfers from the bed which do not increase her back pain  Baseline: Goal status: Ongoing   LONG TERM GOALS: Target date: 03/17/2024    Patient will be independent with final HEP upon discharge from PT and report consistent benefit following exercise completion.    Baseline:  Goal status: INITIAL  2.  Patient will be able to improve 2 min walk test distance by 50 feet or more with LRAD and no increase in pain.   Baseline:  Goal status: INITIAL  3.  Patient will be able to demonstrate hip/knee strength to 5/5 in order to maximize functional mobility, ambulation.  Baseline:  Goal status: INITIAL  4.  Patient will be able to stand for 20 minutes to do light home tasks with no more than minimal increase in lower back pain Baseline:  Goal status: INITIAL  5.  Patient will report rare instances of pain radiating into her left leg/thigh.  Baseline:  Goal status: INITIAL  6. Patient will report being able to walk in and out of the store for < 15 min with no more than min increase in back pain.  Baseline:  Goal status: INITIAL  PLAN:  PT FREQUENCY: 2x/week  PT DURATION: 8 weeks  PLANNED INTERVENTIONS: 97164- PT Re-evaluation, 97750- Physical Performance Testing, 97110-Therapeutic exercises, 97530- Therapeutic activity, [id number]-  Neuromuscular re-education, [id number]- Self Care, [id number]- Manual therapy, [id number]- Gait training, [phone]- Aquatic Therapy, [phone]- Electrical stimulation (unattended), [phone] (1-2 muscles), [id number] (3+ muscles)- Dry Needling, Patient/Family education, Balance training, Spinal mobilization, Cryotherapy, and Moist heat.  PLAN FOR NEXT SESSION: 2-minute walk,  test check home exercise program , core and hip strengthening as tolerated will likely be more comfortable in sitting or standing, modalities for pain Patient interested in [place], [place] 02/16/2024, 10:10 AM   Phone: [phone] Fax: [phone]

## 2024-02-17 ENCOUNTER — Other Ambulatory Visit: Payer: Self-pay

## 2024-02-20 ENCOUNTER — Other Ambulatory Visit: Payer: Self-pay

## 2024-02-21 ENCOUNTER — Encounter: Payer: Self-pay | Admitting: Physical Therapy

## 2024-02-21 ENCOUNTER — Ambulatory Visit: Admitting: Physical Therapy

## 2024-02-21 DIAGNOSIS — R262 Difficulty in walking, not elsewhere classified: Secondary | ICD-10-CM | POA: Diagnosis not present

## 2024-02-21 DIAGNOSIS — M5459 Other low back pain: Secondary | ICD-10-CM | POA: Diagnosis not present

## 2024-02-21 NOTE — Therapy (Addendum)
 "  PHYSICAL THERAPY UNPLANNED DISCHARGE SUMMARY   Visits from Start of Care: 6  Current functional level related to goals / functional outcomes: Current status unknown   Remaining deficits: Current status unknown   Education / Equipment: Pt has not returned since visit listed below  Patient goals were not assessed. Patient is being discharged due to not returning since the last visit.  (the note below was addended to include the above D/C summary on 05/28/2024)  OUTPATIENT PHYSICAL THERAPY TREATMENT NOTE   Patient Name: Kimberly Frazier MRN: [id number] DOB:1961/05/15, 63 y.o., female Today's Date: 02/21/2024  END OF SESSION:  PT End of Session - 02/21/24 1145     Visit Number 6    Number of Visits 16    Date for Recertification  03/16/24    Authorization Type [employer] Dual complete    PT Start Time 1145    PT Stop Time 1226    PT Time Calculation (min) 41 min    Activity Tolerance Patient tolerated treatment well    Behavior During Therapy WFL for tasks assessed/performed         Past Medical History:  Diagnosis Date   Adrenal adenoma, left    Arthritis    Cancer (HCC)    CKD (chronic kidney disease), stage II    Coronary artery disease 07-28-2018 followed [facility]  currently due to no insurance   per cardiac cath 02-06-2015 (positive mild lateral ishcemia on stress test)--- dLAD 80%,  mLAD 40%,  mPDA 80% (small vessel),  ostial D1 70%,  CFx with lumial irregarlities-- medical management   Depression    Diabetic neuropathy (HCC)    Diabetic retinopathy (HCC)    NPDR OU   Headache    History of Bell's palsy 07/2011   per pt residual facial pain on left side occasionally   History of cancer of vagina 1999   per pt completed radiation and chemo   History of sepsis 12/30/2017   positive blood culter fro E.coli   Hyperlipidemia    Hypertension    Hypertensive retinopathy    OU   Hypokalemia    Insulin  dependent type 2 diabetes mellitus,  uncontrolled    followed by pcp---  A1c was 11.7 on 06-15-2018 in epic   Myocardial infarction (HCC)    10 yrs ago?   Nocturia    Peripheral neuropathy    PMB (postmenopausal bleeding)    Wears dentures    upper   Wears glasses    Past Surgical History:  Procedure Laterality Date   BREAST BIOPSY  2012   benign   CARDIAC CATHETERIZATION N/A 02/06/2015   Procedure: Left Heart Cath and Coronary Angiography;  Surgeon: Ezra GORMAN Shuck, MD;  Location: [facility] INVASIVE CV LAB;  Service: Cardiovascular;  Laterality: N/A;   CATARACT EXTRACTION Bilateral OD: 03/07/19, OS: 02/28/19   Dr. Roz   CATARACT EXTRACTION, BILATERAL     COLONOSCOPY     normal exam in [place], [place] abour 10-12 years ago   EYE SURGERY Bilateral OD: 03/07/19, OS: 02/28/19   Cat Sx - Dr. Roz   HYSTEROSCOPY WITH D & C N/A 08/01/2018   Procedure: DILATATION AND CURETTAGE /HYSTEROSCOPY;  Surgeon: Alger Gong, MD;  Location:  SURGERY CENTER;  Service: Gynecology;  Laterality: N/A;   ROBOTIC ASSISTED TOTAL HYSTERECTOMY WITH BILATERAL SALPINGO OOPHERECTOMY N/A 11/28/2018   Procedure: XI ROBOTIC ASSISTED TOTAL HYSTERECTOMY WITH BILATERAL SALPINGO OOPHORECTOMY;  Surgeon: Eloy Herring, MD;  Location: [facility];  Service: Gynecology;  Laterality: N/A;   SENTINEL NODE BIOPSY N/A 11/28/2018   Procedure: SENTINEL NODE BIOPSY;  Surgeon: Eloy Herring, MD;  Location: [facility];  Service: Gynecology;  Laterality: N/A;   UMBILICAL HERNIA REPAIR  child   Patient Active Problem List   Diagnosis Date Noted   Low back pain 04/22/2023   Stage 3b chronic kidney disease (HCC) 08/06/2022   Fecal smearing 09/16/2020   Urinary incontinence, mixed 09/16/2020   Major depressive disorder, single episode, mild 09/16/2020   History of endometrial cancer 07/30/2020   New onset headache 12/21/2019   Cerebral vascular disease 12/21/2019   Migraine without aura and without status migrainosus, not intractable 11/12/2019   Hematuria 11/28/2018    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   History of radiation therapy 11/28/2018   Diabetes mellitus (HCC) 10/06/2018   Type 2 diabetes mellitus with hyperglycemia, with long-term current use of insulin  (HCC) 10/06/2018   Adrenal adenoma, left 06/15/2018   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   Postmenopausal vaginal bleeding 06/15/2018   Hyperkalemia    Diabetic peripheral neuropathy (HCC) 09/03/2016   CAD (coronary artery disease) 04/10/2015   Neck pain 06/20/2014   Severe obesity (BMI >= 40) (HCC) 09/20/2013   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   Hyperlipidemia 10/25/2012   Diabetes mellitus type 2, uncontrolled, with complications 07/26/2012   Essential hypertension 07/26/2012    PCP: [name] MD  REFERRING PROVIDER: [name], MD  REFERRING DIAG: M54.16 (ICD-10-CM) - Left lumbar radiculopathy  Rationale for Evaluation and Treatment: Rehabilitation  THERAPY DIAG:  Other low back pain  Difficulty in walking, not elsewhere classified  ONSET DATE: chronic  SUBJECTIVE:                                                                                                                                                                                           SUBJECTIVE STATEMENT: Pt reports that her back pain is slightly improved.  She is walking more frequently and getting out of the house more.  She has done up to 5 min at a time.  EVAL: Patient reports to physical therapy with complaint of lower back pain extending to her left hip and thigh.  The pain has been present for years but is recently increased to the point where she spends most of the time laying in bed.  She has extreme difficulty standing even for short periods of time to do basic home tasks.  At times her knee buckles and even her left foot may catch on the rug.  Once in a while she will have leg numbness but she also is diabetic so not sure the source of  that.  She avoids lifting for fear of injuring her  back and can stand absolutely no longer than 20 minutes at a time.  She does not walk with a device at this time but she frequently holds onto furniture and walls to maintain stability.     PERTINENT HISTORY:  Diabetes, HTN, obesity neuropathy coronary artery disease chronic kidney disease  PAIN:  Are you having pain? Yes: NPRS scale: 8  Pain location: central low back , wrapping to glute  Pain description: aching, sore , burning Aggravating factors: standing, walking  Relieving factors: Tylenol , does not use heating pad or ice, sitting temporarily   PRECAUTIONS: None  RED FLAGS: None   WEIGHT BEARING RESTRICTIONS: No  FALLS:  Has patient fallen in last 6 months? No Has fallen before but years ago.   LIVING ENVIRONMENT: Lives with: lives with their family Lives in: House/apartment Stairs: Yes: External: 4 steps; on right going up Has following equipment at home: None  OCCUPATION: Retired , Psychologist, Forensic   PLOF: Independent with basic ADLs, Independent with household mobility without device, Needs assistance with homemaking, and Leisure: limited by her back pain, is bored.  She used to go out a lot more and visit her family.   PATIENT GOALS: Pain to be manageable, how I can reduce it myself   NEXT MD VISIT: after PT   OBJECTIVE:  Note: Objective measures were completed at Evaluation unless otherwise noted.  DIAGNOSTIC FINDINGS:  MRI 12/25/23: IMPRESSION: 1. L3-4: Minimal disc bulge. Mild facet osteoarthritis. No stenosis. 2. L4-5: Bilateral facet osteoarthritis with 1 mm of degenerative anterolisthesis. No apparent compressive stenosis of the canal or foramina. The facet arthritis could be a cause of back pain or referred facet syndrome pain. 3. L5-S1: Disc bulge. Mild facet osteoarthritis. Foraminal narrowing that could possibly affect either or both L5 nerves.  PATIENT SURVEYS:  Modified Oswestry: 70% (35/50)  Interpretation of scores: Score Category Description   0-20% Minimal Disability The patient can cope with most living activities. Usually no treatment is indicated apart from advice on lifting, sitting and exercise  21-40% Moderate Disability The patient experiences more pain and difficulty with sitting, lifting and standing. Travel and social life are more difficult and they may be disabled from work. Personal care, sexual activity and sleeping are not grossly affected, and the patient can usually be managed by conservative means  41-60% Severe Disability Pain remains the main problem in this group, but activities of daily living are affected. These patients require a detailed investigation  61-80% Crippled Back pain impinges on all aspects of the patients life. Positive intervention is required  81-100% Bed-bound  These patients are either bed-bound or exaggerating their symptoms  Bluford FORBES Zoe DELENA Karon DELENA, et al. Surgery versus conservative management of stable thoracolumbar fracture: the PRESTO feasibility RCT. [place] ([place]): [employer]; 2021 Nov. [facility] Technology Assessment, No. 25.62.) Appendix 3, Oswestry Disability Index category descriptors. Available from: Findjewelers.cz  Minimally Clinically Important Difference (MCID) = 12.8%  COGNITION: Overall cognitive status: Within functional limits for tasks assessed     SENSATION: WFL  POSTURE: flexed trunk   PALPATION: Generalized pain and soreness across the lower back bilaterally.  LUMBAR ROM:   AROM eval  Flexion Pain on L   Extension 50% Pain on L   Right lateral flexion Limited 25%   Left lateral flexion Limited 25%   Right rotation 25%  Left rotation 25%   (Blank rows = not tested)  LOWER EXTREMITY ROM:  WFL   Passive  Right eval Left eval  Hip flexion WFL WNL, but Painful  Hip extension    Hip abduction    Hip adduction    Hip internal rotation Tight  tight  Hip external rotation tight tight  Knee flexion    Knee  extension    Ankle dorsiflexion    Ankle plantarflexion    Ankle inversion    Ankle eversion     (Blank rows = not tested)  LOWER EXTREMITY MMT:    MMT Right eval Left eval  Hip flexion 4 4-  Hip extension    Hip abduction    Hip adduction    Hip internal rotation    Hip external rotation    Knee flexion 5 4  Knee extension 5 4  Ankle dorsiflexion 5 5  Ankle plantarflexion 3 3  Ankle inversion    Ankle eversion     (Blank rows = not tested)  LUMBAR SPECIAL TESTS:  Straight leg raise test: Positive on L   FUNCTIONAL TESTS:  5 times sit to stand: 32 sec no UEs  Hip weakness evident with standing marching  GAIT: Distance walked: 150 Assistive device utilized: None Level of assistance: Modified independence Comments: Uses the wall for support  TREATMENT DATE:  [facility] Adult PT Treatment:                                                DATE: 02/21/24 Therapeutic Exercise: nu-step L7 x 11m while taking subjective and planning session with patient - tiring  Seated hamstring stretch 2x30 BIL Seated clam - 2x10 Seated hip adduction LAQ - 5# - 2x10 HS curl - Green TB - 2x10  Therapeutic Activity: Walking laps for endurance - 185' x4 1' AMRAP STS from raised table 2x - 10x, 12x    TREATMENT 02/08/24:  Aquatic therapy at [facility]- [address] - therapeutic pool temp 92 degrees Pt enters building independently.  Treatment took place in water  3.8 to  4 ft 8 in.feet deep depending upon activity.  Pt entered and exited the pool via stair and handrails    Aquatic Therapy:  Water  walking for warm up fwd/lat  Mini squat  Hip abd Hs curl Heel raise Bil shoulder ext - Blue DB Yellow noodle stomp - 15x  Pt requires the buoyancy of water  for active assisted exercises with buoyancy supported for strengthening and AROM exercises. Hydrostatic pressure also supports joints by unweighting joint load by at least 50 % in 3-4 feet depth water . 80% in chest to neck deep  water . Water  will provide assistance with movement using the current and laminar flow while the buoyancy reduces weight bearing. Pt requires the viscosity of the water  for resistance with strengthening exercises.  [facility] Adult PT Treatment:                                                DATE: 01/24/24 Therapeutic Exercise:  nu-step L4 14m while taking subjective and planning session with patient - tiring  Seated hamstring stretch Lower trunk rotation Supine clam - alternating - Blue TB - 2' x2 Supine hip adduction squeeze - 5'' holds PPT Sit to stand - raised table with UE support - 3x3  Pt education regarding exercise, DOMS, movement snacks                                                                                                                      HOME EXERCISE PROGRAM: Access Code: [email or web address] URL: [email or web address] Date: 01/24/2024 Prepared by: Helene Gasmen  Exercises - Seated Hamstring Stretch  - 2 x daily - 7 x weekly - 1 sets - 3-5 reps - 30 hold - Supine Lower Trunk Rotation  - 2 x daily - 7 x weekly - 1 sets - 10 reps - 10 hold - Sit to Stand with Arm Reach Toward Target  - 5 x daily - 7 x weekly - 2 sets - 3 reps - Hooklying Isometric Clamshell  - 1 x daily - 7 x weekly - 3 sets - 10 reps - Supine Hip Adduction Isometric with Ball  - 1 x daily - 7 x weekly - 2 sets - 10 reps - 5'' hold - Supine Posterior Pelvic Tilt  - 2 x daily - 7 x weekly - 2 sets - 10 reps - 5-10 second hold hold  ASSESSMENT:  CLINICAL IMPRESSION: Concentrated on endurance in walking and strengthening with sit to stands.  Pt with improved HEP compliance and endurance with both sit to stands and walking.  Updated HEP.    OBJECTIVE IMPAIRMENTS: cardiopulmonary status limiting activity, decreased activity tolerance, decreased balance, decreased endurance, decreased knowledge of condition, decreased mobility, difficulty walking, decreased ROM, decreased strength, increased fascial  restrictions, impaired flexibility, improper body mechanics, obesity, and pain.   ACTIVITY LIMITATIONS: carrying, lifting, bending, sitting, standing, squatting, stairs, transfers, bed mobility, and locomotion level  PARTICIPATION LIMITATIONS: cleaning, laundry, interpersonal relationship, shopping, and community activity  PERSONAL FACTORS: Social background, Time since onset of injury/illness/exacerbation, and 3+ comorbidities: Obesity, chronic pain and metabolic dysfunction are also affecting patient's functional outcome.   REHAB POTENTIAL: Good  CLINICAL DECISION MAKING: Evolving/moderate complexity  EVALUATION COMPLEXITY: Moderate   GOALS: Goals reviewed with patient? Yes  SHORT TERM GOALS: Target date: 02/18/2024   Patient will be able to show independence for initial HEP to include posture, core and hip strength and stability.   Baseline: Goal status: MET  2.  Patient will be able to complete 2 min walk test  Baseline: 9/16: 200' rest after 1' 10'' Goal status: ongoing   3.  Patient will demonstrate transfers from the bed which do not increase her back pain  Baseline: Goal status: Ongoing   LONG TERM GOALS: Target date: 03/17/2024    Patient will be independent with final HEP upon discharge from PT and report consistent benefit following exercise completion.    Baseline:  Goal status: INITIAL  2.  Patient will be able to improve 2 min walk test distance by 50 feet or more with LRAD and no increase in pain.   Baseline:  Goal status: INITIAL  3.  Patient will be able to demonstrate hip/knee strength to 5/5 in order to maximize functional mobility, ambulation.  Baseline:  Goal status: INITIAL  4.  Patient will be able to stand for 20 minutes to do light home tasks with no more than minimal increase in lower back pain Baseline:  Goal status: INITIAL  5.  Patient will report rare instances of pain radiating into her left leg/thigh.  Baseline:  Goal status:  INITIAL  6. Patient will report being able to walk in and out of the store for < 15 min with no more than min increase in back pain.  Baseline:  Goal status: INITIAL  PLAN:  PT FREQUENCY: 2x/week  PT DURATION: 8 weeks  PLANNED INTERVENTIONS: 97164- PT Re-evaluation, 97750- Physical Performance Testing, 97110-Therapeutic exercises, 97530- Therapeutic activity, [id number]- Neuromuscular re-education, 97535- Self Care, [id number]- Manual therapy, [id number]- Gait training, [phone]- Aquatic Therapy, [phone]- Electrical stimulation (unattended), [phone] (1-2 muscles), [id number] (3+ muscles)- Dry Needling, Patient/Family education, Balance training, Spinal mobilization, Cryotherapy, and Moist heat.  PLAN FOR NEXT SESSION: 2-minute walk,  test check home exercise program , core and hip strengthening as tolerated will likely be more comfortable in sitting or standing, modalities for pain Patient interested in [address] [place], PT 02/21/2024, 12:30 PM   Phone: [phone] Fax: [phone]  "

## 2024-02-22 ENCOUNTER — Other Ambulatory Visit (HOSPITAL_BASED_OUTPATIENT_CLINIC_OR_DEPARTMENT_OTHER): Payer: Self-pay | Admitting: Pharmacist

## 2024-02-22 ENCOUNTER — Other Ambulatory Visit: Payer: Self-pay

## 2024-02-22 ENCOUNTER — Encounter (INDEPENDENT_AMBULATORY_CARE_PROVIDER_SITE_OTHER): Admitting: Ophthalmology

## 2024-02-22 DIAGNOSIS — H35033 Hypertensive retinopathy, bilateral: Secondary | ICD-10-CM

## 2024-02-22 DIAGNOSIS — I10 Essential (primary) hypertension: Secondary | ICD-10-CM

## 2024-02-22 DIAGNOSIS — E119 Type 2 diabetes mellitus without complications: Secondary | ICD-10-CM

## 2024-02-22 DIAGNOSIS — Z961 Presence of intraocular lens: Secondary | ICD-10-CM

## 2024-02-22 DIAGNOSIS — H40053 Ocular hypertension, bilateral: Secondary | ICD-10-CM

## 2024-02-22 DIAGNOSIS — E113313 Type 2 diabetes mellitus with moderate nonproliferative diabetic retinopathy with macular edema, bilateral: Secondary | ICD-10-CM

## 2024-02-22 DIAGNOSIS — H35353 Cystoid macular degeneration, bilateral: Secondary | ICD-10-CM

## 2024-02-22 DIAGNOSIS — H04123 Dry eye syndrome of bilateral lacrimal glands: Secondary | ICD-10-CM

## 2024-02-22 DIAGNOSIS — Z7985 Long-term (current) use of injectable non-insulin antidiabetic drugs: Secondary | ICD-10-CM

## 2024-02-22 DIAGNOSIS — Z794 Long term (current) use of insulin: Secondary | ICD-10-CM

## 2024-02-22 NOTE — Progress Notes (Signed)
 Pharmacy TNM Diabetes Measure Review  S:  Patient was identified in a report as being at risk for failing the True [place] Metric of A1c control (<8%). Last A1c was 10.3 on 12/06/2023. Last PCP visit was on 12/06/2023.  Call placed to patient to discuss diabetes control and medication management. I spoke with her earlier this year. We had to stop Ozempic  and adjust insulin  d/t changes in patients vision that she attributes to the Ozempic . Most recently in July, PCP saw her and advised to restart Ozempic  but pt declined. We are having to adjust insulin  instead.   I placed a call to her today but she was unavailable. This was primarily to make sure she has refills before seeing me in October. She is due for refills on glimepiride  and Humalog .   Current diabetes medications include: glimepiride  2 mg daily, Lantus  64 units once daily, Humalog  28 units BID before meals Insurance coverage: [employer]  O:   Lab Results  Component Value Date   HGBA1C 10.3 (A) 12/06/2023   There were no vitals filed for this visit.  Lipid Panel     Component Value Date/Time   CHOL 126 08/06/2022 1125   TRIG 110 08/06/2022 1125   HDL 44 08/06/2022 1125   CHOLHDL 2.9 08/06/2022 1125   CHOLHDL 5 07/15/2015 1513   VLDL 44.0 (H) 07/15/2015 1513   LDLCALC 62 08/06/2022 1125   LDLDIRECT 93.0 07/15/2015 1513    Clinical Atherosclerotic Cardiovascular Disease (ASCVD): No  The ASCVD Risk score (Arnett DK, et al., 2019) failed to calculate for the following reasons:   Risk score cannot be calculated because patient has a medical history suggesting prior/existing ASCVD   Patient is participating in a Managed Medicaid Plan: No   A/P: Diabetes longstanding currently uncontrolled based on A1c. I placed refills for those medications needing refills. I was unable to connect with the patient but I did leave a message explaining that refills are ready for pick-up.  -Refills placed for glimepiride  and Humalog .  -Next  A1c anticipated 02/2024.   Follow-up:  Me in October.   Herlene Fleeta Morris, PharmD, JAQUELINE, CPP Clinical Pharmacist [facility] [phone]

## 2024-02-23 ENCOUNTER — Encounter: Payer: Self-pay | Admitting: Gastroenterology

## 2024-02-23 ENCOUNTER — Other Ambulatory Visit: Payer: Self-pay | Admitting: Internal Medicine

## 2024-02-23 ENCOUNTER — Ambulatory Visit

## 2024-02-23 ENCOUNTER — Other Ambulatory Visit: Payer: Self-pay

## 2024-02-24 ENCOUNTER — Other Ambulatory Visit: Payer: Self-pay

## 2024-02-24 MED ORDER — LANTUS SOLOSTAR 100 UNIT/ML ~~LOC~~ SOPN
64.0000 [IU] | PEN_INJECTOR | Freq: Every day | SUBCUTANEOUS | 1 refills | Status: AC
Start: 2024-02-24 — End: ?
  Filled 2024-02-24 – 2024-03-16 (×2): qty 60, 93d supply, fill #0
  Filled 2024-05-08: qty 60, 93d supply, fill #1

## 2024-02-24 MED ORDER — TECHLITE PEN NEEDLES 32G X 4 MM MISC
2 refills | Status: AC
  Filled 2024-02-24: qty 100, 90d supply, fill #0
  Filled 2024-02-28 – 2024-05-08 (×2): qty 100, 90d supply, fill #1

## 2024-02-24 NOTE — Telephone Encounter (Signed)
 Requested medication (s) are due for refill today: yes  Requested medication (s) are on the active medication list: no  Last refill:  08/12/21  Future visit scheduled: yes  Notes to clinic:  Unable to refill per protocol, Rx expired for pen needles      Requested Prescriptions  Pending Prescriptions Disp Refills   Insulin  Pen Needle (TECHLITE PEN NEEDLES) 32G X 4 MM MISC 100 each 1    Sig: Use as directed.     Endocrinology: Diabetes - Testing Supplies Passed - 02/24/2024  3:50 PM      Passed - Valid encounter within last 12 months    Recent Outpatient Visits           2 months ago Type 2 diabetes mellitus with morbid obesity (HCC)   [facility] [facility] [place]. [facility] Vicci Sober B, MD   10 months ago Type 2 diabetes mellitus with morbid obesity [facility])   [place] [facility] Of [place]. [facility] Vicci Sober B, MD   1 year ago Type 2 diabetes mellitus with morbid obesity [facility])   [place] [facility] Dept Of [place]. [facility] Vicci Sober B, MD   2 years ago Type 2 diabetes mellitus with morbid obesity [facility])   [place] [facility] Dept Of [place]. [facility] Vicci Sober B, MD   2 years ago Type 2 diabetes mellitus with morbid obesity [facility])   [place] [facility] Dept Of [place]. [facility] Vicci Sober NOVAK, MD       Future Appointments             In 2 weeks Fleeta Morris, Garnette CROME, RPH-CPP [place] [place] [facility] [address] - A Dept Of [place]. [facility], [place]   In 1 month Vicci, Sober NOVAK, MD [facility] [place] - A Dept Of [place]. [facility], [address]             insulin  glargine (LANTUS  SOLOSTAR) 100 UNIT/ML Solostar Pen 60 mL 1    Sig: Inject 64 Units into the skin daily.     Endocrinology:  Diabetes - Insulins Failed - 02/24/2024  3:50 PM       Failed - HBA1C is between 0 and 7.9 and within 180 days    HbA1c, POC (controlled diabetic range)  Date Value Ref Range Status  12/06/2023 10.3 (A) 0.0 - 7.0 % Final         Passed - Valid encounter within last 6 months    Recent Outpatient Visits           2 months ago Type 2 diabetes mellitus with morbid obesity (HCC)   [place] [facility] Of [place]. [facility] Vicci Sober B, MD   10 months ago Type 2 diabetes mellitus with morbid obesity [facility])   [place] [facility] Dept Of [place]. [facility] Vicci Sober B, MD   1 year ago Type 2 diabetes mellitus with morbid obesity [facility])   [place] [facility] Dept Of [place]. [facility] Vicci Sober B, MD   2 years ago Type 2 diabetes mellitus with morbid obesity [facility])    [facility] - A Dept Of [place]  [facility] [place] [place] Vicci Sober B, MD   2 years ago Type 2 diabetes mellitus with morbid obesity [facility])   [place] [facility] - A Dept Of [place]. [facility] Vicci Sober NOVAK, MD       Future Appointments             In 2 weeks Fleeta Morris, Garnette CROME, RPH-CPP [place] [facility] [place] - [facility] Dept Of [place]. [facility] [place]   In 1 month Vicci, Sober NOVAK, MD [facility] [place] - A Dept Of [place]. [facility], [place]

## 2024-02-28 ENCOUNTER — Other Ambulatory Visit: Payer: Self-pay

## 2024-02-29 ENCOUNTER — Ambulatory Visit: Payer: Self-pay | Admitting: Physical Therapy

## 2024-03-01 ENCOUNTER — Other Ambulatory Visit: Payer: Self-pay

## 2024-03-01 ENCOUNTER — Other Ambulatory Visit: Payer: Self-pay | Admitting: Cardiology

## 2024-03-01 DIAGNOSIS — I2510 Atherosclerotic heart disease of native coronary artery without angina pectoris: Secondary | ICD-10-CM

## 2024-03-01 MED ORDER — NITROGLYCERIN 0.4 MG SL SUBL
SUBLINGUAL_TABLET | SUBLINGUAL | 0 refills | Status: AC
  Filled 2024-03-01: qty 25, fill #0
  Filled 2024-03-02: qty 25, 8d supply, fill #0
  Filled 2024-03-05: qty 25, 10d supply, fill #0
  Filled 2024-03-06: qty 25, fill #0
  Filled 2024-03-07: qty 25, 9d supply, fill #0

## 2024-03-02 ENCOUNTER — Other Ambulatory Visit: Payer: Self-pay

## 2024-03-05 ENCOUNTER — Other Ambulatory Visit: Payer: Self-pay

## 2024-03-06 ENCOUNTER — Other Ambulatory Visit: Payer: Self-pay

## 2024-03-07 ENCOUNTER — Ambulatory Visit: Attending: Internal Medicine | Admitting: Physical Therapy

## 2024-03-07 ENCOUNTER — Other Ambulatory Visit: Payer: Self-pay

## 2024-03-07 ENCOUNTER — Other Ambulatory Visit: Payer: Self-pay | Admitting: Family

## 2024-03-07 ENCOUNTER — Telehealth: Payer: Self-pay | Admitting: Physical Therapy

## 2024-03-07 NOTE — Telephone Encounter (Signed)
Called and informed patient of missed visit and provided reminder of next appt and attendance policy.  

## 2024-03-14 ENCOUNTER — Ambulatory Visit: Admitting: Physical Therapy

## 2024-03-14 ENCOUNTER — Other Ambulatory Visit: Payer: Self-pay

## 2024-03-14 NOTE — Therapy (Deleted)
 OUTPATIENT PHYSICAL THERAPY TREATMENT NOTE   Patient Name: Kimberly Frazier MRN: [id number] DOB:[birth year], 63 y.o., female Today's Date: 03/14/2024  END OF SESSION:   Past Medical History:  Diagnosis Date   Adrenal adenoma, left    Arthritis    Cancer (HCC)    CKD (chronic kidney disease), stage II    Coronary artery disease 07-28-2018 followed by [facility]  currently due to no insurance   per cardiac cath 02-06-2015 (positive mild lateral ishcemia on stress test)--- dLAD 80%,  mLAD 40%,  mPDA 80% (small vessel),  ostial D1 70%,  CFx with lumial irregarlities-- medical management   Depression    Diabetic neuropathy (HCC)    Diabetic retinopathy (HCC)    NPDR OU   Headache    History of Bell's palsy 07/2011   per pt residual facial pain on left side occasionally   History of cancer of vagina 1999   per pt completed radiation and chemo   History of sepsis 12/30/2017   positive blood culter fro E.coli   Hyperlipidemia    Hypertension    Hypertensive retinopathy    OU   Hypokalemia    Insulin  dependent type 2 diabetes mellitus, uncontrolled    followed by pcp---  A1c was 11.7 on 06-15-2018 in epic   Myocardial infarction [facility])    10 yrs ago?   Nocturia    Peripheral neuropathy    PMB (postmenopausal bleeding)    Wears dentures    upper   Wears glasses    Past Surgical History:  Procedure Laterality Date   BREAST BIOPSY  2012   benign   CARDIAC CATHETERIZATION N/A 02/06/2015   Procedure: Left Heart Cath and Coronary Angiography;  Surgeon: Ezra GORMAN Shuck, MD;  Location: [facility] INVASIVE CV LAB;  Service: Cardiovascular;  Laterality: N/A;   CATARACT EXTRACTION Bilateral OD: 03/07/19, OS: 02/28/19   Dr. Roz   CATARACT EXTRACTION, BILATERAL     COLONOSCOPY     normal exam in [place], [place] abour 10-12 years ago   EYE SURGERY Bilateral OD: 03/07/19, OS: 02/28/19   Cat Sx - Dr. Roz   HYSTEROSCOPY WITH D & C N/A 08/01/2018   Procedure: DILATATION AND  CURETTAGE /HYSTEROSCOPY;  Surgeon: Alger Gong, MD;  Location: [facility];  Service: Gynecology;  Laterality: N/A;   ROBOTIC ASSISTED TOTAL HYSTERECTOMY WITH BILATERAL SALPINGO OOPHERECTOMY N/A 11/28/2018   Procedure: XI ROBOTIC ASSISTED TOTAL HYSTERECTOMY WITH BILATERAL SALPINGO OOPHORECTOMY;  Surgeon: Eloy Herring, MD;  Location: [facility];  Service: Gynecology;  Laterality: N/A;   SENTINEL NODE BIOPSY N/A 11/28/2018   Procedure: SENTINEL NODE BIOPSY;  Surgeon: Eloy Herring, MD;  Location: [facility];  Service: Gynecology;  Laterality: N/A;   UMBILICAL HERNIA REPAIR  child   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   History of radiation therapy 11/28/2018   Diabetes mellitus (HCC) 10/06/2018   Type 2 diabetes mellitus with hyperglycemia, with long-term current use of insulin  (HCC) 10/06/2018   Adrenal adenoma, left 06/15/2018   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  Postmenopausal vaginal bleeding 06/15/2018   Hyperkalemia    Diabetic peripheral neuropathy (HCC) 09/03/2016   CAD (coronary artery disease) 04/10/2015   Neck pain 06/20/2014   Severe obesity (BMI >= 40) (HCC) 09/20/2013   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  ONSET DATE: chronic  SUBJECTIVE:                                                                                                                                                                                           SUBJECTIVE STATEMENT: Pt reports that her back pain is slightly improved.  She is walking more frequently and getting out of the house more.  She has done up to 5 min at a time.  EVAL: Patient reports to physical therapy with complaint of lower back pain extending to her left hip and thigh.  The pain has been present for years but is recently increased to the point where she spends most of the time laying in bed.  She has extreme difficulty standing even for short periods of time to do basic home tasks.  At times her knee buckles and even her left foot may catch on the rug.  Once in a while she will have leg numbness but she also is diabetic so not sure the source of that.  She avoids lifting for fear of injuring her back and can stand absolutely no longer than 20 minutes at a time.  She does not walk with a device at this time but she frequently holds onto furniture and walls to maintain stability.     PERTINENT HISTORY:  Diabetes, HTN, obesity neuropathy coronary artery disease chronic kidney disease  PAIN:  Are you having pain? Yes: NPRS scale: 8  Pain location: central low back , wrapping to glute  Pain description: aching, sore , burning Aggravating factors: standing, walking  Relieving factors: Tylenol , does not use heating pad or ice, sitting temporarily   PRECAUTIONS: None  RED FLAGS: None   WEIGHT BEARING RESTRICTIONS: No  FALLS:  Has patient fallen in last 6 months? No Has fallen before but years ago.   LIVING ENVIRONMENT: Lives with: lives with their family Lives in: House/apartment Stairs: Yes: External: 4 steps; on right going up Has following equipment at home:  None  OCCUPATION: Retired , Psychologist, forensic   PLOF:  Independent with basic ADLs, Independent with household mobility without device, Needs assistance with homemaking, and Leisure: limited by her back pain, is bored.  She used to go out a lot more and visit her family.   PATIENT GOALS: Pain to be manageable, how I can reduce it myself   NEXT MD VISIT: after PT   OBJECTIVE:  Note: Objective measures were completed at Evaluation unless otherwise noted.  DIAGNOSTIC FINDINGS:  MRI 12/25/23: IMPRESSION: 1. L3-4: Minimal disc bulge. Mild facet osteoarthritis. No stenosis. 2. L4-5: Bilateral facet osteoarthritis with 1 mm of degenerative anterolisthesis. No apparent compressive stenosis of the canal or foramina. The facet arthritis could be a cause of back pain or referred facet syndrome pain. 3. L5-S1: Disc bulge. Mild facet osteoarthritis. Foraminal narrowing that could possibly affect either or both L5 nerves.  PATIENT SURVEYS:  Modified Oswestry: 70% (35/50)  Interpretation of scores: Score Category Description  0-20% Minimal Disability The patient can cope with most living activities. Usually no treatment is indicated apart from advice on lifting, sitting and exercise  21-40% Moderate Disability The patient experiences more pain and difficulty with sitting, lifting and standing. Travel and social life are more difficult and they may be disabled from work. Personal care, sexual activity and sleeping are not grossly affected, and the patient can usually be managed by conservative means  41-60% Severe Disability Pain remains the main problem in this group, but activities of daily living are affected. These patients require a detailed investigation  61-80% Crippled Back pain impinges on all aspects of the patient's life. Positive intervention is required  81-100% Bed-bound  These patients are either bed-bound or exaggerating their symptoms  Bluford FORBES Zoe DELENA Karon DELENA, et al. Surgery  versus conservative management of stable thoracolumbar fracture: the PRESTO feasibility RCT. [place] ([place]): [employer]; 2021 Nov. [facility] Technology Assessment, No. 25.62.) Appendix 3, Oswestry Disability Index category descriptors. Available from: FindJewel[employer].cz  Minimally Clinically Important Difference (MCID) = 12.8%  COGNITION: Overall cognitive status: Within functional limits for tasks assessed     SENSATION: WFL  POSTURE: flexed trunk   PALPATION: Generalized pain and soreness across the lower back bilaterally.  LUMBAR ROM:   AROM eval  Flexion Pain on L   Extension 50% Pain on L   Right lateral flexion Limited 25%   Left lateral flexion Limited 25%   Right rotation 25%  Left rotation 25%   (Blank rows = not tested)  LOWER EXTREMITY ROM:   WFL   Passive  Right eval Left eval  Hip flexion WFL WNL, but Painful  Hip extension    Hip abduction    Hip adduction    Hip internal rotation Tight  tight  Hip external rotation tight tight  Knee flexion    Knee extension    Ankle dorsiflexion    Ankle plantarflexion    Ankle inversion    Ankle eversion     (Blank rows = not tested)  LOWER EXTREMITY MMT:    MMT Right eval Left eval  Hip flexion 4 4-  Hip extension    Hip abduction    Hip adduction    Hip internal rotation    Hip external rotation    Knee flexion 5 4  Knee extension 5 4  Ankle dorsiflexion 5 5  Ankle plantarflexion 3 3  Ankle inversion    Ankle eversion     (Blank rows = not tested)  LUMBAR SPECIAL TESTS:  Straight leg raise test: Positive on L  FUNCTIONAL TESTS:  5 times sit to stand: 32 sec no UEs  Hip weakness evident with standing marching  GAIT: Distance walked: 150 Assistive device utilized: None Level of assistance: Modified independence Comments: Uses the wall for support  TREATMENT DATE:   TREATMENT 03/14/24:  Aquatic therapy at [facility]- [address] - therapeutic  pool temp 92 degrees Pt enters building independently.  Treatment took place in water  3.8 to  4 ft 8 in.feet deep depending upon activity.  Pt entered and exited the pool via stair and handrails    Aquatic Therapy:  Water  walking for warm up fwd/lat  Mini squat  Hip abd Hs curl Heel raise Bil shoulder ext - Blue DB Yellow noodle stomp - 15x  Pt requires the buoyancy of water  for active assisted exercises with buoyancy supported for strengthening and AROM exercises. Hydrostatic pressure also supports joints by unweighting joint load by at least 50 % in 3-4 feet depth water . 80% in chest to neck deep water . Water  will provide assistance with movement using the current and laminar flow while the buoyancy reduces weight bearing. Pt requires the viscosity of the water  for resistance with strengthening exercises.   [facility] Adult PT Treatment:                                                DATE: 02/21/24 Therapeutic Exercise: nu-step L7 x 21m while taking subjective and planning session with patient - tiring  Seated hamstring stretch 2x30 BIL Seated clam - 2x10 Seated hip adduction LAQ - 5# - 2x10 HS curl - Green TB - 2x10  Therapeutic Activity: Walking laps for endurance - 185' x4 1' AMRAP STS from raised table 2x - 10x, 12x    TREATMENT 02/08/24:  Aquatic therapy at [facility]- [address] - therapeutic pool temp 92 degrees Pt enters building independently.  Treatment took place in water  3.8 to  4 ft 8 in.feet deep depending upon activity.  Pt entered and exited the pool via stair and handrails    Aquatic Therapy:  Water  walking for warm up fwd/lat  Mini squat  Hip abd Hs curl Heel raise Bil shoulder ext - Blue DB Yellow noodle stomp - 15x  Pt requires the buoyancy of water  for active assisted exercises with buoyancy supported for strengthening and AROM exercises. Hydrostatic pressure also supports joints by unweighting joint load by at least 50 % in 3-4 feet depth water .  80% in chest to neck deep water . Water  will provide assistance with movement using the current and laminar flow while the buoyancy reduces weight bearing. Pt requires the viscosity of the water  for resistance with strengthening exercises.  [facility] Adult PT Treatment:                                                DATE: 01/24/24 Therapeutic Exercise:  nu-step L4 68m while taking subjective and planning session with patient - tiring  Seated hamstring stretch Lower trunk rotation Supine clam - alternating - Blue TB - 2' x2 Supine hip adduction squeeze - 5'' holds PPT Sit to stand - raised table with UE support - 3x3 Pt education regarding exercise, DOMS, movement snacks  HOME EXERCISE PROGRAM: Access Code: [email or web address] URL: [email or web address] Date: 01/24/2024 Prepared by: Helene Gasmen  Exercises - Seated Hamstring Stretch  - 2 x daily - 7 x weekly - 1 sets - 3-5 reps - 30 hold - Supine Lower Trunk Rotation  - 2 x daily - 7 x weekly - 1 sets - 10 reps - 10 hold - Sit to Stand with Arm Reach Toward Target  - 5 x daily - 7 x weekly - 2 sets - 3 reps - Hooklying Isometric Clamshell  - 1 x daily - 7 x weekly - 3 sets - 10 reps - Supine Hip Adduction Isometric with Ball  - 1 x daily - 7 x weekly - 2 sets - 10 reps - 5'' hold - Supine Posterior Pelvic Tilt  - 2 x daily - 7 x weekly - 2 sets - 10 reps - 5-10 second hold hold  ASSESSMENT:  CLINICAL IMPRESSION: Session today focused on *** in the aquatic environment for use of buoyancy to offload joints and the viscosity of water  as resistance during therapeutic exercise.  ***.  Patient was able to tolerate all prescribed exercises in the aquatic environment with no adverse effects and reports ***/10 pain at the end of the session. Patient continues to benefit from skilled PT services on land and aquatic based and should  be progressed as able to improve functional independence.     OBJECTIVE IMPAIRMENTS: cardiopulmonary status limiting activity, decreased activity tolerance, decreased balance, decreased endurance, decreased knowledge of condition, decreased mobility, difficulty walking, decreased ROM, decreased strength, increased fascial restrictions, impaired flexibility, improper body mechanics, obesity, and pain.   ACTIVITY LIMITATIONS: carrying, lifting, bending, sitting, standing, squatting, stairs, transfers, bed mobility, and locomotion level  PARTICIPATION LIMITATIONS: cleaning, laundry, interpersonal relationship, shopping, and community activity  PERSONAL FACTORS: Social background, Time since onset of injury/illness/exacerbation, and 3+ comorbidities: Obesity, chronic pain and metabolic dysfunction are also affecting patient's functional outcome.   REHAB POTENTIAL: Good  CLINICAL DECISION MAKING: Evolving/moderate complexity  EVALUATION COMPLEXITY: Moderate   GOALS: Goals reviewed with patient? Yes  SHORT TERM GOALS: Target date: 02/18/2024   Patient will be able to show independence for initial HEP to include posture, core and hip strength and stability.   Baseline: Goal status: MET  2.  Patient will be able to complete 2 min walk test  Baseline: 9/16: 200' rest after 1' 10'' Goal status: ongoing   3.  Patient will demonstrate transfers from the bed which do not increase her back pain  Baseline: Goal status: Ongoing   LONG TERM GOALS: Target date: 03/17/2024    Patient will be independent with final HEP upon discharge from PT and report consistent benefit following exercise completion.    Baseline:  Goal status: INITIAL  2.  Patient will be able to improve 2 min walk test distance by 50 feet or more with LRAD and no increase in pain.   Baseline:  Goal status: INITIAL  3.  Patient will be able to demonstrate hip/knee strength to 5/5 in order to maximize functional mobility,  ambulation.  Baseline:  Goal status: INITIAL  4.  Patient will be able to stand for 20 minutes to do light home tasks with no more than minimal increase in lower back pain Baseline:  Goal status: INITIAL  5.  Patient will report rare instances of pain radiating into her left leg/thigh.  Baseline:  Goal status: INITIAL  6. Patient will report being able to walk in and out of the store  for < 15 min with no more than min increase in back pain.  Baseline:  Goal status: INITIAL  PLAN:  PT FREQUENCY: 2x/week  PT DURATION: 8 weeks  PLANNED INTERVENTIONS: 97164- PT Re-evaluation, 97750- Physical Performance Testing, 97110-Therapeutic exercises, 97530- Therapeutic activity, [id number]- Neuromuscular re-education, 97535- Self Care, [id number]- Manual therapy, [id number]- Gait training, [phone]- Aquatic Therapy, [phone]- Electrical stimulation (unattended), [phone] (1-2 muscles), [id number] (3+ muscles)- Dry Needling, Patient/Family education, Balance training, Spinal mobilization, Cryotherapy, and Moist heat.  PLAN FOR NEXT SESSION: 2-minute walk,  test check home exercise program , core and hip strengthening as tolerated will likely be more comfortable in sitting or standing, modalities for pain Patient interested in [address], PT 03/14/2024, 7:51 AM   Phone: [phone] Fax: [phone]

## 2024-03-15 ENCOUNTER — Ambulatory Visit: Admitting: Pharmacist

## 2024-03-16 ENCOUNTER — Other Ambulatory Visit: Payer: Self-pay

## 2024-03-19 ENCOUNTER — Ambulatory Visit: Admitting: Physician Assistant

## 2024-03-19 ENCOUNTER — Other Ambulatory Visit: Payer: Self-pay

## 2024-03-20 ENCOUNTER — Other Ambulatory Visit: Payer: Self-pay

## 2024-03-21 ENCOUNTER — Other Ambulatory Visit: Payer: Self-pay

## 2024-03-22 ENCOUNTER — Other Ambulatory Visit: Payer: Self-pay

## 2024-03-30 ENCOUNTER — Other Ambulatory Visit: Payer: Self-pay

## 2024-04-02 ENCOUNTER — Encounter: Payer: Self-pay | Admitting: Radiology

## 2024-04-09 ENCOUNTER — Ambulatory Visit: Admitting: Internal Medicine

## 2024-04-09 NOTE — Progress Notes (Shared)
 [facility] - Clinic Note  04/23/2024     CHIEF COMPLAINT Patient presents for No chief complaint on file.   HISTORY OF PRESENT ILLNESS: Kimberly Frazier is a 63 y.o. female who presents to the clinic today for:    Patient late to f/u 9 weeks instead of 7 weeks. States vision seems the same OU.  Referring physician: [name], MD [address] [place],  [place] [address]  HISTORICAL INFORMATION:   Selected notes from the MEDICAL RECORD NUMBER Referred by Dr. Oneil Platts for concern of CME    CURRENT MEDICATIONS: Current Outpatient Medications (Ophthalmic Drugs)  Medication Sig   brimonidine  (ALPHAGAN ) 0.15 % ophthalmic solution Place 1 drop into both eyes 2 (two) times daily.   brimonidine  (ALPHAGAN ) 0.2 % ophthalmic solution Place 1 drop into both eyes 2 (two) times daily.   dorzolamide -timolol  (COSOPT ) 2-0.5 % ophthalmic solution Place 1 drop into both eyes 2 (two) times daily.   ketorolac  (ACULAR ) 0.5 % ophthalmic solution Place 1 drop into both eyes 4 (four) times daily.   prednisoLONE  acetate (PRED FORTE ) 1 % ophthalmic suspension Place 1 drop into both eyes daily.   No current facility-administered medications for this visit. (Ophthalmic Drugs)   Current Outpatient Medications (Other)  Medication Sig   ezetimibe  (ZETIA ) 10 MG tablet Take 1 tablet (10 mg total) by mouth daily. PATIENT MUST CALL AND SCHEDULE OVERDUE ANNUAL APPOINTMENT FOR FURTHER REFILLS.FIRST ATTEMPT   Accu-Chek Softclix Lancets lancets Use to check blood sugar 3 times daily.   acetaminophen  (TYLENOL ) 500 MG tablet Take 2 tablets (1,000 mg total) by mouth every 6 (six) hours as needed.   amLODipine  (NORVASC ) 10 MG tablet Take 1 tablet (10 mg total) by mouth daily.   aspirin  EC 81 MG tablet Take 1 tablet (81 mg total) daily by mouth.   atorvastatin  (LIPITOR) 80 MG tablet Take 1 tablet (80 mg total) by mouth once daily. (PLEASE SCHEDULE AN APPT FOR FUTURE REFILLS)    Blood Glucose Monitoring Suppl (ACCU-CHEK GUIDE) w/Device KIT 1 kit by Does not apply route in the morning, at noon, and at bedtime. Use to check blood sugar 3 times daily.   carvedilol  (COREG ) 25 MG tablet Take 1 tablet (25 mg total) by mouth 2 (two) times daily with a meal.   Continuous Blood Gluc Receiver (FREESTYLE LIBRE 2 READER) DEVI Use as directed   Continuous Blood Gluc Sensor (FREESTYLE LIBRE SENSOR SYSTEM) MISC Change sensor every 2 weeks   diclofenac  Sodium (VOLTAREN ) 1 % GEL Apply 2 g topically 2 (two) times daily as needed.   glimepiride  (AMARYL ) 2 MG tablet Take 1 tablet (2 mg total) by mouth daily before breakfast.   glucose blood (ACCU-CHEK GUIDE) test strip Use to check blood sugar 3 times daily.   insulin  glargine (LANTUS  SOLOSTAR) 100 UNIT/ML Solostar Pen Inject 64 Units into the skin daily.   insulin  lispro (HUMALOG  KWIKPEN) 100 UNIT/ML KwikPen Inject 28 Units into the skin in the morning and at bedtime. Increase to 34 units into the skin twice daily after 1 week if blood sugars remain above 200.   Insulin  Pen Needle (TECHLITE PEN NEEDLES) 32G X 4 MM MISC Use as directed.   isosorbide  mononitrate (IMDUR ) 60 MG 24 hr tablet Take 1 tablet (60 mg total) by mouth daily.   nitroGLYCERIN  (NITROSTAT ) 0.4 MG SL tablet Place 1 tab under tongue for chest pain.  May repeat after 5 minutes x 2.  DO NOT TAKE  MORE THAN 3 TABS DURING AN EPISODE OF CHEST PAIN.Pt must schedule an overdue followup appt with Cardiology for any more refills. [phone] 1st attempt Thank You   omeprazole  (PRILOSEC) 20 MG capsule Take 1 capsule (20 mg total) by mouth daily.   potassium chloride  (KLOR-CON ) 10 MEQ tablet Take 2 tablets (20 mEq total) by mouth daily.   tiZANidine  (ZANAFLEX ) 4 MG tablet Take 1 tablet (4 mg total) by mouth every 8 (eight) hours as needed for muscle spasms.   No current facility-administered medications for this visit. (Other)   REVIEW OF SYSTEMS:   ALLERGIES Allergies  Allergen  Reactions   Jardiance  [Empagliflozin ] Other (See Comments)    Frequent yeast infection   Metformin  And Related Diarrhea   PAST MEDICAL HISTORY Past Medical History:  Diagnosis Date   Adrenal adenoma, left    Arthritis    Cancer (HCC)    CKD (chronic kidney disease), stage II    Coronary artery disease 07-28-2018 followed [facility]  currently due to no insurance   per cardiac cath 02-06-2015 (positive mild lateral ishcemia on stress test)--- dLAD 80%,  mLAD 40%,  mPDA 80% (small vessel),  ostial D1 70%,  CFx with lumial irregarlities-- medical management   Depression    Diabetic neuropathy (HCC)    Diabetic retinopathy (HCC)    NPDR OU   Headache    History of Bell's palsy 07/2011   per pt residual facial pain on left side occasionally   History of cancer of vagina 1999   per pt completed radiation and chemo   History of sepsis 12/30/2017   positive blood culter fro E.coli   Hyperlipidemia    Hypertension    Hypertensive retinopathy    OU   Hypokalemia    Insulin  dependent type 2 diabetes mellitus, uncontrolled    followed by pcp---  A1c was 11.7 on 06-15-2018 in epic   Myocardial infarction [facility])    10 yrs ago?   Nocturia    Peripheral neuropathy    PMB (postmenopausal bleeding)    Wears dentures    upper   Wears glasses    Past Surgical History:  Procedure Laterality Date   BREAST BIOPSY  2012   benign   CARDIAC CATHETERIZATION N/A 02/06/2015   Procedure: Left Heart Cath and Coronary Angiography;  Surgeon: Ezra GORMAN Shuck, MD;  Location: [facility] INVASIVE CV LAB;  Service: Cardiovascular;  Laterality: N/A;   CATARACT EXTRACTION Bilateral OD: 03/07/19, OS: 02/28/19   Dr. Roz   CATARACT EXTRACTION, BILATERAL     COLONOSCOPY     normal exam in [place], [place] abour 10-12 years ago   EYE SURGERY Bilateral OD: 03/07/19, OS: 02/28/19   Cat Sx - Dr. Roz   HYSTEROSCOPY WITH D & C N/A 08/01/2018   Procedure: DILATATION AND CURETTAGE /HYSTEROSCOPY;   Surgeon: Alger Gong, MD;  Location: [facility];  Service: Gynecology;  Laterality: N/A;   ROBOTIC ASSISTED TOTAL HYSTERECTOMY WITH BILATERAL SALPINGO OOPHERECTOMY N/A 11/28/2018   Procedure: XI ROBOTIC ASSISTED TOTAL HYSTERECTOMY WITH BILATERAL SALPINGO OOPHORECTOMY;  Surgeon: Eloy Herring, MD;  Location: [facility] ORS;  Service: Gynecology;  Laterality: N/A;   SENTINEL NODE BIOPSY N/A 11/28/2018   Procedure: SENTINEL NODE BIOPSY;  Surgeon: Eloy Herring, MD;  Location: [facility] ORS;  Service: Gynecology;  Laterality: N/A;   UMBILICAL HERNIA REPAIR  child   FAMILY HISTORY Family History  Problem Relation Age of Onset   Heart disease Mother    Hypertension Mother  Diabetes Mother    Cancer Father        hodgkins lymphoma   Heart disease Sister        heart attack   Diabetes Sister    Colon cancer Brother 65   Diabetes Maternal Aunt    Diabetes Maternal Uncle    Cancer Other        parent   Diabetes Other        parent   Heart disease Other        parent   Hyperlipidemia Other        parent   Hypertension Other        parent   Arthritis Other        parent   Breast cancer Neg Hx    Colon polyps Neg Hx    Esophageal cancer Neg Hx    Rectal cancer Neg Hx    Stomach cancer Neg Hx    SOCIAL HISTORY Social History   Tobacco Use   Smoking status: Never   Smokeless tobacco: Never  Vaping Use   Vaping status: Never Used  Substance Use Topics   Alcohol  use: Not Currently   Drug use: Not Currently       OPHTHALMIC EXAM:  Not recorded    IMAGING AND PROCEDURES  Imaging and Procedures for 04/23/2024          ASSESSMENT/PLAN:    ICD-10-CM   1. Moderate nonproliferative diabetic retinopathy of both eyes with macular edema associated with type 2 diabetes mellitus (HCC)  Z88.6686     2. Current use of insulin  (HCC)  Z79.4     3. Long-term (current) use of injectable non-insulin  antidiabetic drugs  Z79.85     4. CME (cystoid macular edema), bilateral   H35.353     5. Essential hypertension  I10     6. Hypertensive retinopathy of both eyes  H35.033     7. Pseudophakia of both eyes  Z96.1     8. 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 Dry eyes, bilateral  H04.123      1-4. Moderate Non-proliferative diabetic retinopathy, OU OD with combination DME/CME  - last A1c 8.7 (11.12.24), 8.2 (03.08.24); 9.6 (08.07.23), 10.1 (1.31.23)  - pt has stopped Ozempic  recently due to change in vision - h/o delayed to follow up from September 2021 to April 2022--7 mos instead of 4 wks**             - exam shows scattered IRH, edema and tortuous blood vessels OU - FA (10.23.20) shows leaking MA OU and paracentral petaloid hyperfluorecence OD --> CME/[place] - s/p IVA OD #1 (11.06.20), #2 (12.18.20), #3 (02.24.21), #4 (03.24.21), #5 (04.21.21), #6 (5.26.21), #7 (07.26.21), #8 (08.25.21), #9 (09.23.21), #10 (04.26.22), #11 (01.08.25), #12 (02.26.25), #13 (04.16.25), #14 (06.04.25), #15 (08.06.25) - s/p IVA OS #1 (10.23.20), #2 (11.18.20), #3 (12.18.20), #4 (02.24.21), #5 (03.24.21)  -- IVA resistance  ============================= - s/p IVE OD #1 (05.25.22), #2 (07.01.22), #3 (07.29.22), #4 (08.29.22 - JDM), #5 (09.26.22), #6 (10.24.22), #7 (11.21.22), #8 (12.19.22), #9 (01.23.23), #10 (02.20.23), #11 (03.22.23), #12 (04.19.23), #13 (05.17.23), #14 (06.14.23), #15 (08.04.23), #16 (09.01.23), #17 (10.10.23), #18 (11.14.23), #19 (12.19.23), #20 (02.13.24), #21 (03.20.24), #22 (03.20.24), #23 (03.20.24), #24 (04.24.24) #25(06.28.24), #26 (08.16.24), #27 (10.02.24), #28 (11.20.24) - s/p IVE OS #1 (04.21.21), #2 (5.26.21), #3 (07.26.21), #4 (08.25.21), #5 (09.23.21), #6 (04.26.22), #7 (05.25.22), #8 (07.01.22), #9 (07.29.22), #10 (08.29.22 - JDM), #11 (09.26.22), #12 (10.24.22)  -- IVE resistance ============================= -  s/p IVV OS #1 (11.21.22, sample), #2 (12.19.22), #3 (01.23.23), #4 (02.20.23), #5 (03.22.23), #6 (04.19.23), #7 (05.17.23), #8  (06.14.23, sample), #9 (08.04.23), #10 (9.01.23), #11 (10.10.23), #12 (11.14.23), #13 (12.19.23), #14 (02.13.24), #15 (03.20.24) #16(3.20.24), #17 (03.20.24), #18 (04.24.24) #19(06.28.24), #20 (08.16.24), #21 (10.02.24), #22 (11.20.24), #23 (01.08.25 -- SAMPLE), #24 (02.26.25 -- SAMPLE), #25 (04.16.25 - SAMPLE), #26 (06.04.25--SAMPLE), #27 (08.06.25--SAMPLE) - BCVA OD 20/20 -- stable; OS stable at 20/150  - OCT today: OD: Persistent IRF/cystic changes inferior macula and fovea--slightly increased; OS: Persistent IRF/IRHM inferior and temporal mac and fovea -- slightly improved, +central ORA at 9 weeks - recommend IVA OD #16 and IVV OS #28 [sample] today, 11.24.25 with follow up dec to 7 weeks - [employer] funding unavailable at this time -- pt wishes to proceed w/ injections             - RBA of procedure discussed, questions answered - Vabysmo  OS consent form signed and scanned on 11.20.24 - Avastin  OD informed consent signed and scanned on 01.08.25 - see procedure note             - continue Ketorolac  QID OU             - f/u in 7 weeks, DFE, OCT, possible injection OU   5,6. Hypertensive retinopathy OU             - discussed importance of tight BP control             - continue to monitor   7. Pseudophakia OU            - s/p CE/IOL OU [place]), OD: 03/07/2019, OS: 02/28/2019             - IOLs in good position             - CME OU as above             - continue to monitor   8. Ocular Hypertension OU             - IOP: 21, 19 - h/o steroid response             - cont Cosopt  BID OU and Brimonidine  BID OU  9. Dry eyes OU - recommend artificial tears and lubricating ointment as needed   Ophthalmic Meds Ordered this visit:  No orders of the defined types were placed in this encounter.    No follow-ups on file.  There are no Patient Instructions on file for this visit.  This document serves as a record of services personally performed by Redell JUDITHANN Hans, MD, PhD. It was created  on their behalf by Avelina Pereyra, COA an ophthalmic technician. The creation of this record is the provider's dictation and/or activities during the visit.   Electronically signed by: Avelina GORMAN Pereyra, COT  04/23/24  7:22 AM   This document serves as a record of services personally performed by Redell JUDITHANN Hans, MD, PhD. It was created on their behalf by Wanda GEANNIE Keens, COT an ophthalmic technician. The creation of this record is the provider's dictation and/or activities during the visit.    Electronically signed by:  Wanda GEANNIE Keens, COT  04/23/24 7:22 AM  Redell JUDITHANN Hans, M.D., Ph.D. Diseases & Surgery of the Retina and Vitreous [facility]    Abbreviations: M myopia (nearsighted); A astigmatism; H hyperopia (farsighted); P presbyopia; Mrx spectacle prescription;  CTL contact lenses; OD right eye; OS left  eye; OU both eyes  XT exotropia; ET esotropia; PEK punctate epithelial keratitis; PEE punctate epithelial erosions; DES dry eye syndrome; MGD meibomian gland dysfunction; ATs artificial tears; PFAT's preservative free artificial tears; NSC nuclear sclerotic cataract; PSC posterior subcapsular cataract; ERM epi-retinal membrane; PVD posterior vitreous detachment; RD retinal detachment; DM diabetes mellitus; DR diabetic retinopathy; NPDR non-proliferative diabetic retinopathy; PDR proliferative diabetic retinopathy; CSME clinically significant macular edema; DME diabetic macular edema; dbh dot blot hemorrhages; CWS cotton wool spot; POAG primary open angle glaucoma; C/D cup-to-disc ratio; HVF humphrey visual field; GVF goldmann visual field; OCT optical coherence tomography; IOP intraocular pressure; BRVO Branch retinal vein occlusion; CRVO central retinal vein occlusion; CRAO central retinal artery occlusion; BRAO branch retinal artery occlusion; RT retinal tear; SB scleral buckle; PPV pars plana vitrectomy; VH Vitreous hemorrhage; PRP panretinal laser  photocoagulation; IVK intravitreal kenalog; VMT vitreomacular traction; MH Macular hole;  NVD neovascularization of the disc; NVE neovascularization elsewhere; AREDS age related eye disease study; ARMD age related macular degeneration; POAG primary open angle glaucoma; EBMD epithelial/anterior basement membrane dystrophy; ACIOL anterior chamber intraocular lens; IOL intraocular lens; PCIOL posterior chamber intraocular lens; Phaco/IOL phacoemulsification with intraocular lens placement; PRK photorefractive keratectomy; LASIK laser assisted in situ keratomileusis; HTN hypertension; DM diabetes mellitus; COPD chronic obstructive pulmonary disease

## 2024-04-18 ENCOUNTER — Other Ambulatory Visit: Payer: Self-pay | Admitting: Internal Medicine

## 2024-04-18 ENCOUNTER — Other Ambulatory Visit (HOSPITAL_COMMUNITY): Payer: Self-pay

## 2024-04-18 ENCOUNTER — Other Ambulatory Visit: Payer: Self-pay

## 2024-04-18 ENCOUNTER — Other Ambulatory Visit: Payer: Self-pay | Admitting: Pharmacist

## 2024-04-18 DIAGNOSIS — E1169 Type 2 diabetes mellitus with other specified complication: Secondary | ICD-10-CM

## 2024-04-18 MED ORDER — GLIMEPIRIDE 2 MG PO TABS
2.0000 mg | ORAL_TABLET | Freq: Every day | ORAL | 0 refills | Status: AC
  Filled 2024-04-18: qty 90, 90d supply, fill #0

## 2024-04-19 ENCOUNTER — Other Ambulatory Visit: Payer: Self-pay

## 2024-04-23 ENCOUNTER — Other Ambulatory Visit: Payer: Self-pay | Admitting: Internal Medicine

## 2024-04-23 ENCOUNTER — Other Ambulatory Visit: Payer: Self-pay

## 2024-04-23 ENCOUNTER — Encounter (INDEPENDENT_AMBULATORY_CARE_PROVIDER_SITE_OTHER): Admitting: Ophthalmology

## 2024-04-23 DIAGNOSIS — E113313 Type 2 diabetes mellitus with moderate nonproliferative diabetic retinopathy with macular edema, bilateral: Secondary | ICD-10-CM

## 2024-04-23 DIAGNOSIS — H35353 Cystoid macular degeneration, bilateral: Secondary | ICD-10-CM

## 2024-04-23 DIAGNOSIS — K219 Gastro-esophageal reflux disease without esophagitis: Secondary | ICD-10-CM

## 2024-04-23 DIAGNOSIS — Z7985 Long-term (current) use of injectable non-insulin antidiabetic drugs: Secondary | ICD-10-CM

## 2024-04-23 DIAGNOSIS — H04123 Dry eye syndrome of bilateral lacrimal glands: Secondary | ICD-10-CM

## 2024-04-23 DIAGNOSIS — Z961 Presence of intraocular lens: Secondary | ICD-10-CM

## 2024-04-23 DIAGNOSIS — H40053 Ocular hypertension, bilateral: Secondary | ICD-10-CM

## 2024-04-23 DIAGNOSIS — Z794 Long term (current) use of insulin: Secondary | ICD-10-CM

## 2024-04-23 DIAGNOSIS — I10 Essential (primary) hypertension: Secondary | ICD-10-CM

## 2024-04-23 DIAGNOSIS — H35033 Hypertensive retinopathy, bilateral: Secondary | ICD-10-CM

## 2024-04-23 MED ORDER — OMEPRAZOLE 20 MG PO CPDR
20.0000 mg | DELAYED_RELEASE_CAPSULE | Freq: Every day | ORAL | 0 refills | Status: DC
  Filled 2024-04-23: qty 30, 30d supply, fill #0

## 2024-05-03 ENCOUNTER — Telehealth: Payer: Self-pay

## 2024-05-03 NOTE — Telephone Encounter (Signed)
 Copied from CRM #[id number]. Topic: Clinical - Medication Question >> May 03, 2024  3:05 PM Antony RAMAN wrote: Reason for CRM: requesting an antibiotic be sent to her pharmacy. Cb- [phone]

## 2024-05-04 ENCOUNTER — Other Ambulatory Visit: Payer: Self-pay

## 2024-05-05 NOTE — Telephone Encounter (Signed)
 For what?

## 2024-05-07 ENCOUNTER — Ambulatory Visit: Admitting: Internal Medicine

## 2024-05-07 ENCOUNTER — Encounter: Payer: Self-pay | Admitting: Internal Medicine

## 2024-05-07 ENCOUNTER — Other Ambulatory Visit: Payer: Self-pay

## 2024-05-07 DIAGNOSIS — R159 Full incontinence of feces: Secondary | ICD-10-CM

## 2024-05-07 DIAGNOSIS — R197 Diarrhea, unspecified: Secondary | ICD-10-CM

## 2024-05-07 DIAGNOSIS — N3941 Urge incontinence: Secondary | ICD-10-CM

## 2024-05-07 DIAGNOSIS — R300 Dysuria: Secondary | ICD-10-CM

## 2024-05-07 MED ORDER — CIPROFLOXACIN HCL 500 MG PO TABS
500.0000 mg | ORAL_TABLET | Freq: Two times a day (BID) | ORAL | 0 refills | Status: DC
  Filled 2024-05-07: qty 14, 7d supply, fill #0

## 2024-05-07 NOTE — Progress Notes (Signed)
 Virtual Visit via Video Note  I connected with Kimberly Frazier on 05/07/2024 at 3:34 PM by a video enabled telemedicine application and verified that I am speaking with the correct person using two identifiers.  Location: Patient: home Provider: Office   I discussed the limitations of evaluation and management by telemedicine and the availability of in person appointments. The patient expressed understanding and agreed to proceed.  History of Present Illness: Patient with history of DM with neuropathy and retinopathy, HTN with hypertensive heart disease, CAD (Coronary CT revealed occlusion in the mid to distal LAD, distal RCA.  Cardiology states that she will need CABG eventually), HL, obesity, endometrial CA s/p hysterectomy   Discussed the use of AI scribe software for clinical note transcription with the patient, who gave verbal consent to proceed.  History of Present Illness Kimberly Frazier is a 63 year old female who presents for UC visit  She c/o urinary and bowel incontinence that have been present for three to four weeks, accompanied by a burning sensation during urination for the past two weeks. Bowel incontinence is characterized by diarrhea, with loose and slimy stools, occurring at least five times per day, including nocturnal episodes. No blood in stools is noted.  She endorses abdominal pain for the past week, particularly in the upper abdomen, which becomes hard and painful, especially when sitting up. The pain is somewhat relieved by lying down. She also reports vomiting, with the last episode occurring 4 days ago and describes an incident of syncope in the bathroom following vomiting on that day.  She has not checked her temperature but experiences hot spells requiring her to lie under a fan. She has been mostly bedridden due to fear of incontinence accidents and has not been able to attend previous appointments due to transportation issues.  She has not been prescribed any new  medications by specialists since her last visit. She uses pads for incontinence management and has been off balance with her medications due to her illness. She has been on lockdown at home due to fear of having accidents in public.     Outpatient Encounter Medications as of 05/07/2024  Medication Sig   ezetimibe  (ZETIA ) 10 MG tablet Take 1 tablet (10 mg total) by mouth daily. PATIENT MUST CALL AND SCHEDULE OVERDUE ANNUAL APPOINTMENT FOR FURTHER REFILLS.FIRST ATTEMPT   Accu-Chek Softclix Lancets lancets Use to check blood sugar 3 times daily.   acetaminophen  (TYLENOL ) 500 MG tablet Take 2 tablets (1,000 mg total) by mouth every 6 (six) hours as needed.   amLODipine  (NORVASC ) 10 MG tablet Take 1 tablet (10 mg total) by mouth daily.   aspirin  EC 81 MG tablet Take 1 tablet (81 mg total) daily by mouth.   atorvastatin  (LIPITOR) 80 MG tablet Take 1 tablet (80 mg total) by mouth once daily. (PLEASE SCHEDULE AN APPT FOR FUTURE REFILLS)   Blood Glucose Monitoring Suppl (ACCU-CHEK GUIDE) w/Device KIT 1 kit by Does not apply route in the morning, at noon, and at bedtime. Use to check blood sugar 3 times daily.   brimonidine  (ALPHAGAN ) 0.15 % ophthalmic solution Place 1 drop into both eyes 2 (two) times daily.   brimonidine  (ALPHAGAN ) 0.2 % ophthalmic solution Place 1 drop into both eyes 2 (two) times daily.   carvedilol  (COREG ) 25 MG tablet Take 1 tablet (25 mg total) by mouth 2 (two) times daily with a meal.   Continuous Blood Gluc Receiver (FREESTYLE LIBRE 2 READER) DEVI Use as directed   Continuous Blood Gluc  Sensor (FREESTYLE LIBRE SENSOR SYSTEM) MISC Change sensor every 2 weeks   diclofenac  Sodium (VOLTAREN ) 1 % GEL Apply 2 g topically 2 (two) times daily as needed.   dorzolamide -timolol  (COSOPT ) 2-0.5 % ophthalmic solution Place 1 drop into both eyes 2 (two) times daily.   glimepiride  (AMARYL ) 2 MG tablet Take 1 tablet (2 mg total) by mouth daily before breakfast.   glucose blood (ACCU-CHEK GUIDE)  test strip Use to check blood sugar 3 times daily.   insulin  glargine (LANTUS  SOLOSTAR) 100 UNIT/ML Solostar Pen Inject 64 Units into the skin daily.   insulin  lispro (HUMALOG  KWIKPEN) 100 UNIT/ML KwikPen Inject 28 Units into the skin in the morning and at bedtime. Increase to 34 units into the skin twice daily after 1 week if blood sugars remain above 200.   Insulin  Pen Needle (TECHLITE PEN NEEDLES) 32G X 4 MM MISC Use as directed.   isosorbide  mononitrate (IMDUR ) 60 MG 24 hr tablet Take 1 tablet (60 mg total) by mouth daily.   ketorolac  (ACULAR ) 0.5 % ophthalmic solution Place 1 drop into both eyes 4 (four) times daily.   nitroGLYCERIN  (NITROSTAT ) 0.4 MG SL tablet Place 1 tab under tongue for chest pain.  May repeat after 5 minutes x 2.  DO NOT TAKE MORE THAN 3 TABS DURING AN EPISODE OF CHEST PAIN.Pt must schedule an overdue followup appt with Cardiology for any more refills. [phone] 1st attempt Thank You   omeprazole  (PRILOSEC) 20 MG capsule Take 1 capsule (20 mg total) by mouth daily.   potassium chloride  (KLOR-CON ) 10 MEQ tablet Take 2 tablets (20 mEq total) by mouth daily.   prednisoLONE  acetate (PRED FORTE ) 1 % ophthalmic suspension Place 1 drop into both eyes daily.   tiZANidine  (ZANAFLEX ) 4 MG tablet Take 1 tablet (4 mg total) by mouth every 8 (eight) hours as needed for muscle spasms.   No facility-administered encounter medications on file as of 05/07/2024.      Observations/Objective: Patient had difficulty operating her camera but was able to see me  Assessment and Plan: 1. Dysuria (Primary) Will treat empirically for UTI.  She is agreeable to coming to the lab tomorrow when she comes to our pharmacy to pick up antibiotics to give a urine sample for culture and have other blood test done. - Urine Culture; Future - ciprofloxacin  (CIPRO ) 500 MG tablet; Take 1 tablet (500 mg total) by mouth 2 (two) times daily for 7 days.  Dispense: 14 tablet; Refill: 0  2. Diarrhea,  unspecified type Patient with diarrhea x 3 to 4 weeks associated with some abdominal pain over the past week.  She had an episode of vomiting.  Questionable gastroenteritis but would not have expected it to last this long.  She is agreeable to coming to give stool sample for culture, C. difficile, ova plus..  Advised to use Imodium  over-the-counter as needed.  Drink 0 sugar Gatorade - Cdiff NAA+O+P+Stool Culture; Future  3. Urge incontinence of urine Will treat for UTI and see whether symptoms resolve.  If the incontinence does not resolve, we can discuss ordering incontinence supplies through [facility]  4. Incontinence of feces, unspecified fecal incontinence type Will check stool cultures to evaluate for any infection.  If negative, we can discuss ordering incontinence supplies  5. Type 2 diabetes mellitus with morbid obesity [facility]) Patient had future labs in the system that she has not had done as yet.  In anticipation of her upcoming in office appointment with me later this week, she  will stop by the lab to have these blood test done prior to that visit. - CBC; Future - Comprehensive metabolic panel with GFR; Future - Lipid panel; Future   Follow Up Instructions: Patient to keep upcoming appointment with me on the 11th of this month.   I discussed the assessment and treatment plan with the patient. The patient was provided an opportunity to ask questions and all were answered. The patient agreed with the plan and demonstrated an understanding of the instructions.   The patient was advised to call back or seek an in-person evaluation if the symptoms worsen or if the condition fails to improve as anticipated.  I spent 17 minutes dedicated to the care of this patient on the date of this encounter to include previsit review of last note, time with patient on platform discussing diagnosis and management and post visit entering of orders.  This note has been created with Engineer, agricultural. Any transcriptional errors are unintentional.  Barnie Louder, MD

## 2024-05-07 NOTE — Telephone Encounter (Signed)
 Called & spoke to the patient. Verified name & DOB. Scheduled a video visit for today 05/07/24 to address concerns. Patient confirmed visit and expressed verbal understanding of all discussed.

## 2024-05-08 ENCOUNTER — Other Ambulatory Visit: Payer: Self-pay

## 2024-05-08 ENCOUNTER — Ambulatory Visit: Attending: Internal Medicine

## 2024-05-08 ENCOUNTER — Ambulatory Visit: Attending: Family Medicine

## 2024-05-08 ENCOUNTER — Other Ambulatory Visit: Payer: Self-pay | Admitting: Family

## 2024-05-08 VITALS — Ht 64.0 in | Wt 318.0 lb

## 2024-05-08 DIAGNOSIS — E1169 Type 2 diabetes mellitus with other specified complication: Secondary | ICD-10-CM

## 2024-05-08 DIAGNOSIS — R300 Dysuria: Secondary | ICD-10-CM

## 2024-05-08 DIAGNOSIS — Z0000 Encounter for general adult medical examination without abnormal findings: Secondary | ICD-10-CM

## 2024-05-08 NOTE — Progress Notes (Signed)
 Chief Complaint  Patient presents with   Medicare Wellness    SUBSEQUENT     Subjective:   Kimberly Frazier is a 63 y.o. female who presents for a Medicare Annual Wellness Visit.  Visit info / Clinical Intake: Medicare Wellness Visit Type:: Subsequent Annual Wellness Visit Persons participating in visit and providing information:: patient Medicare Wellness Visit Mode:: Telephone If telephone:: video declined Since this visit was completed virtually, some vitals may be partially provided or unavailable. Missing vitals are due to the limitations of the virtual format.: Documented vitals are patient reported If Telephone or Video please confirm:: I connected with patient using audio/video enable telemedicine. I verified patient identity with two identifiers, discussed telehealth limitations, and patient agreed to proceed. Patient Location:: HOME Provider Location:: HOME OFFICE Interpreter Needed?: No Pre-visit prep was completed: yes AWV questionnaire completed by patient prior to visit?: no Living arrangements:: (!) lives alone Patient's Overall Health Status Rating: (!) fair Typical amount of pain: (!) a lot Does pain affect daily life?: (!) yes Are you currently prescribed opioids?: no  Dietary Habits and Nutritional Risks How many meals a day?: (!) 1 (SANDWICH, CHIPS AND DRINK; FAMILY BRINGS FOOD ALSO) Eats fruit and vegetables daily?: (!) no Most meals are obtained by: preparing own meals; having others provide food (DAUGHTER WILL BRING FOOD) In the last 2 weeks, have you had any of the following?: (!) nausea, vomiting, diarrhea Diabetic:: (!) yes Any non-healing wounds?: no How often do you check your BS?: continuous glucose monitor; 2 Would you like to be referred to a Nutritionist or for Diabetic Management? : no  Functional Status Activities of Daily Living (to include ambulation/medication): Independent Ambulation: Independent Home Assistive Devices/Equipment:  Eyeglasses; Dentures (specify type) Medication Administration: Independent Home Management (perform basic housework or laundry): Independent Manage your own finances?: yes Primary transportation is: driving Concerns about vision?: no *vision screening is required for WTM* Concerns about hearing?: no  Fall Screening Falls in the past year?: 1 Number of falls in past year: 0 (FELL IN THE BATHROOM, HIT HEAD) Was there an injury with Fall?: 0 Fall Risk Category Calculator: 1 Patient Fall Risk Level: Low Fall Risk  Fall Risk Patient at Risk for Falls Due to: Impaired vision; History of fall(s) (PATIENT STATED THAT SHE FALLS AT LEAST ONCE A YEAR.) Fall risk Follow up: Falls evaluation completed; Education provided  Home and Transportation Safety: All rugs have non-skid backing?: N/A, no rugs All stairs or steps have railings?: N/A, no stairs Grab bars in the bathtub or shower?: (!) no Have non-skid surface in bathtub or shower?: (!) no Good home lighting?: yes Regular seat belt use?: yes Hospital stays in the last year:: no  Cognitive Assessment Difficulty concentrating, remembering, or making decisions? : no Will 6CIT or Mini Cog be Completed: yes What year is it?: 0 points What month is it?: 0 points Give patient an address phrase to remember (5 components): [place] [address] About what time is it?: 0 points Count backwards from 20 to 1: 0 points Say the months of the year in reverse: 0 points Repeat the address phrase from earlier: 0 points 6 CIT Score: 0 points  Advance Directives (For Healthcare) Does Patient Have a Medical Advance Directive?: Yes Does patient want to make changes to medical advance directive?: No - Patient declined (DAUGHTER IS POA; NEED COPY) Type of Advance Directive: Healthcare Power of [place]; Living will Copy of Healthcare Power of Attorney in Chart?: No - copy requested Copy  of Living Will in Chart?: No - copy  requested  Reviewed/Updated  Reviewed/Updated: Reviewed All (Medical, Surgical, Family, Medications, Allergies, Care Teams, Patient Goals)    Allergies (verified) Jardiance  [empagliflozin ] and Metformin  and related   Current Medications (verified) Outpatient Encounter Medications as of 05/08/2024  Medication Sig   ezetimibe  (ZETIA ) 10 MG tablet Take 1 tablet (10 mg total) by mouth daily. PATIENT MUST CALL AND SCHEDULE OVERDUE ANNUAL APPOINTMENT FOR FURTHER REFILLS.FIRST ATTEMPT   Accu-Chek Softclix Lancets lancets Use to check blood sugar 3 times daily.   acetaminophen  (TYLENOL ) 500 MG tablet Take 2 tablets (1,000 mg total) by mouth every 6 (six) hours as needed.   amLODipine  (NORVASC ) 10 MG tablet Take 1 tablet (10 mg total) by mouth daily.   aspirin  EC 81 MG tablet Take 1 tablet (81 mg total) daily by mouth.   atorvastatin  (LIPITOR) 80 MG tablet Take 1 tablet (80 mg total) by mouth once daily. (PLEASE SCHEDULE AN APPT FOR FUTURE REFILLS)   Blood Glucose Monitoring Suppl (ACCU-CHEK GUIDE) w/Device KIT 1 kit by Does not apply route in the morning, at noon, and at bedtime. Use to check blood sugar 3 times daily.   brimonidine  (ALPHAGAN ) 0.15 % ophthalmic solution Place 1 drop into both eyes 2 (two) times daily.   brimonidine  (ALPHAGAN ) 0.2 % ophthalmic solution Place 1 drop into both eyes 2 (two) times daily.   carvedilol  (COREG ) 25 MG tablet Take 1 tablet (25 mg total) by mouth 2 (two) times daily with a meal.   ciprofloxacin  (CIPRO ) 500 MG tablet Take 1 tablet (500 mg total) by mouth 2 (two) times daily for 7 days.   Continuous Blood Gluc Receiver (FREESTYLE LIBRE 2 READER) DEVI Use as directed   Continuous Blood Gluc Sensor (FREESTYLE LIBRE SENSOR SYSTEM) MISC Change sensor every 2 weeks   diclofenac  Sodium (VOLTAREN ) 1 % GEL Apply 2 g topically 2 (two) times daily as needed.   dorzolamide -timolol  (COSOPT ) 2-0.5 % ophthalmic solution Place 1 drop into both eyes 2 (two) times daily.    glimepiride  (AMARYL ) 2 MG tablet Take 1 tablet (2 mg total) by mouth daily before breakfast.   glucose blood (ACCU-CHEK GUIDE) test strip Use to check blood sugar 3 times daily.   insulin  glargine (LANTUS  SOLOSTAR) 100 UNIT/ML Solostar Pen Inject 64 Units into the skin daily.   insulin  lispro (HUMALOG  KWIKPEN) 100 UNIT/ML KwikPen Inject 28 Units into the skin in the morning and at bedtime. Increase to 34 units into the skin twice daily after 1 week if blood sugars remain above 200.   Insulin  Pen Needle (TECHLITE PEN NEEDLES) 32G X 4 MM MISC Use as directed.   isosorbide  mononitrate (IMDUR ) 60 MG 24 hr tablet Take 1 tablet (60 mg total) by mouth daily.   ketorolac  (ACULAR ) 0.5 % ophthalmic solution Place 1 drop into both eyes 4 (four) times daily.   nitroGLYCERIN  (NITROSTAT ) 0.4 MG SL tablet Place 1 tab under tongue for chest pain.  May repeat after 5 minutes x 2.  DO NOT TAKE MORE THAN 3 TABS DURING AN EPISODE OF CHEST PAIN.Pt must schedule an overdue followup appt with Cardiology for any more refills. [phone] 1st attempt Thank You   omeprazole  (PRILOSEC) 20 MG capsule Take 1 capsule (20 mg total) by mouth daily.   potassium chloride  (KLOR-CON ) 10 MEQ tablet Take 2 tablets (20 mEq total) by mouth daily.   prednisoLONE  acetate (PRED FORTE ) 1 % ophthalmic suspension Place 1 drop into both eyes daily.   tiZANidine  (  ZANAFLEX ) 4 MG tablet Take 1 tablet (4 mg total) by mouth every 8 (eight) hours as needed for muscle spasms.   No facility-administered encounter medications on file as of 05/08/2024.    History: Past Medical History:  Diagnosis Date   Adrenal adenoma, left    Arthritis    Cancer (HCC)    CKD (chronic kidney disease), stage II    Coronary artery disease 07-28-2018 followed [facility]  currently due to no insurance   per cardiac cath 02-06-2015 (positive mild lateral ishcemia on stress test)--- dLAD 80%,  mLAD 40%,  mPDA 80% (small vessel),  ostial D1 70%,  CFx  with lumial irregarlities-- medical management   Depression    Diabetic neuropathy (HCC)    Diabetic retinopathy (HCC)    NPDR OU   Headache    History of Bell's palsy 07/2011   per pt residual facial pain on left side occasionally   History of cancer of vagina 1999   per pt completed radiation and chemo   History of sepsis 12/30/2017   positive blood culter fro E.coli   Hyperlipidemia    Hypertension    Hypertensive retinopathy    OU   Hypokalemia    Insulin  dependent type 2 diabetes mellitus, uncontrolled    followed by pcp---  A1c was 11.7 on 06-15-2018 in epic   Myocardial infarction [facility])    10 yrs ago?   Nocturia    Peripheral neuropathy    PMB (postmenopausal bleeding)    Wears dentures    upper   Wears glasses    Past Surgical History:  Procedure Laterality Date   BREAST BIOPSY  2012   benign   CARDIAC CATHETERIZATION N/A 02/06/2015   Procedure: Left Heart Cath and Coronary Angiography;  Surgeon: Ezra GORMAN Shuck, MD;  Location: [facility] INVASIVE CV LAB;  Service: Cardiovascular;  Laterality: N/A;   CATARACT EXTRACTION Bilateral OD: 03/07/19, OS: 02/28/19   Dr. Roz   CATARACT EXTRACTION, BILATERAL     COLONOSCOPY     normal exam in [place], [place] abour 10-12 years ago   EYE SURGERY Bilateral OD: 03/07/19, OS: 02/28/19   Cat Sx - Dr. Roz   HYSTEROSCOPY WITH D & C N/A 08/01/2018   Procedure: DILATATION AND CURETTAGE /HYSTEROSCOPY;  Surgeon: Alger Gong, MD;  Location: [facility];  Service: Gynecology;  Laterality: N/A;   ROBOTIC ASSISTED TOTAL HYSTERECTOMY WITH BILATERAL SALPINGO OOPHERECTOMY N/A 11/28/2018   Procedure: XI ROBOTIC ASSISTED TOTAL HYSTERECTOMY WITH BILATERAL SALPINGO OOPHORECTOMY;  Surgeon: Eloy Herring, MD;  Location: [facility] ORS;  Service: Gynecology;  Laterality: N/A;   SENTINEL NODE BIOPSY N/A 11/28/2018   Procedure: SENTINEL NODE BIOPSY;  Surgeon: Eloy Herring, MD;  Location: [facility] OR[facility];  Service: Gynecology;  Laterality: N/A;   UMBILICAL  HERNIA REPAIR  child   Family History  Problem Relation Age of Onset   Heart disease Mother    Hypertension Mother    Diabetes Mother    Cancer Father        hodgkins lymphoma   Heart disease Sister        heart attack   Diabetes Sister    Colon cancer Brother 65   Diabetes Maternal Aunt    Diabetes Maternal Uncle    Cancer Other        parent   Diabetes Other        parent   Heart disease Other        parent   Hyperlipidemia Other  parent   Hypertension Other        parent   Arthritis Other        parent   Breast cancer Neg Hx    Colon polyps Neg Hx    Esophageal cancer Neg Hx    Rectal cancer Neg Hx    Stomach cancer Neg Hx    Social History   Occupational History   Occupation: retired  Tobacco Use   Smoking status: Never   Smokeless tobacco: Never  Vaping Use   Vaping status: Never Used  Substance and Sexual Activity   Alcohol  use: Not Currently   Drug use: Not Currently   Sexual activity: Not Currently   Tobacco Counseling Counseling given: Not Answered  SDOH Screenings   Food Insecurity: No Food Insecurity (05/08/2024)  Housing: Low Risk  (05/08/2024)  Transportation Needs: Unmet Transportation Needs (05/08/2024)  Utilities: Not At Risk (05/08/2024)  Alcohol  Screen: Low Risk  (05/08/2024)  Depression (PHQ2-9): Low Risk  (05/08/2024)  Financial Resource Strain: Low Risk  (04/12/2023)  Physical Activity: Inactive (05/08/2024)  Social Connections: Socially Isolated (05/08/2024)  Stress: Stress Concern Present (05/08/2024)  Tobacco Use: Low Risk  (05/08/2024)  Health Literacy: Adequate Health Literacy (05/08/2024)   See flowsheets for full screening details  Depression Screen PHQ 2 & 9 Depression Scale- Over the past 2 weeks, how often have you been bothered by any of the following problems? Little interest or pleasure in doing things: 0 Feeling down, depressed, or hopeless (PHQ Adolescent also includes...irritable): 0 PHQ-2 Total Score: 0 Trouble  falling or staying asleep, or sleeping too much: 1 (VOIDS SEVERAL TIMES DURING THE NIGHT) Feeling tired or having little energy: 1 (DUE TO COLD-LIKE SYMPTOMS) Poor appetite or overeating (PHQ Adolescent also includes...weight loss): 0 Feeling bad about yourself - or that you are a failure or have let yourself or your family down: 0 Trouble concentrating on things, such as reading the newspaper or watching television (PHQ Adolescent also includes...like school work): 0 Moving or speaking so slowly that other people could have noticed. Or the opposite - being so fidgety or restless that you have been moving around a lot more than usual: 0 Thoughts that you would be better off dead, or of hurting yourself in some way: 0 PHQ-9 Total Score: 2 If you checked off any problems, how difficult have these problems made it for you to do your work, take care of things at home, or get along with other people?: Not difficult at all  Depression Treatment Depression Interventions/Treatment : EYV7-0 Score <4 Follow-up Not Indicated     Goals Addressed             This Visit's Progress    05/08/2024: To lose weight and get down to less than 300 lbs so that I can control my diabetes.               Objective:    Today's Vitals   05/08/24 1613  Weight: (!) 318 lb (144.2 kg)  Height: 5' 4 (1.626 m)  PainSc: 10-Worst pain ever  PainLoc: Back   Body mass index is 54.58 kg/m.  Hearing/Vision screen Hearing Screening - Comments:: Patient has some hearing difficulties. No hearing aids. Vision Screening - Comments:: Wears reading glasses - up to date with routine eye exams with Campbell Hans, MD. Patient has diabetic retinopathy.  Immunizations and Health Maintenance Health Maintenance  Topic Date Due   COVID-19 Vaccine (3 - Pfizer risk series) 03/20/2020   DTaP/Tdap/Td (2 -  Td or Tdap) 07/26/2022   Diabetic kidney evaluation - eGFR measurement  08/06/2023   Bone Density Scan  Never done    Influenza Vaccine  12/30/2023   OPHTHALMOLOGY EXAM  03/01/2024   FOOT EXAM  04/11/2024   Pneumococcal Vaccine: 50+ Years (2 of 2 - PCV) 12/06/2024 (Originally 07/26/2013)   Zoster Vaccines- Shingrix (2 of 2) 12/06/2024 (Originally 11/11/2020)   HEMOGLOBIN A1C  06/07/2024   Diabetic kidney evaluation - Urine ACR  12/05/2024   Medicare Annual Wellness (AWV)  05/08/2025   Mammogram  05/16/2025   Hepatitis C Screening  Completed   HIV Screening  Completed   Hepatitis B Vaccines 19-59 Average Risk  Aged Out   Meningococcal B Vaccine  Aged Out   Fecal DNA (Cologuard)  Discontinued        Assessment/Plan:  This is a routine wellness examination for Kimberly Frazier.  Patient Care Team: [name], MD as PCP - General (Internal Medicine) Lonni Slain, MD as PCP - Cardiology (Cardiology) Valdemar Rogue, MD as Consulting Physician (Ophthalmology) Ulis Bottcher, PA-C as Physician Assistant (Physician Assistant)  I have personally reviewed and noted the following in the patient's chart:   Medical and social history Use of alcohol , tobacco or illicit drugs  Current medications and supplements including opioid prescriptions. Functional ability and status Nutritional status Physical activity Advanced directives List of other physicians Hospitalizations, surgeries, and ER visits in previous 12 months Vitals Screenings to include cognitive, depression, and falls Referrals and appointments  No orders of the defined types were placed in this encounter.  In addition, I have reviewed and discussed with patient certain preventive protocols, quality metrics, and best practice recommendations. A written personalized care plan for preventive services as well as general preventive health recommendations were provided to patient.   Roz LOISE Fuller, LPN   [id number]   Return in about 1 year (around 05/08/2025) for Medicare wellness.  After Visit Summary: (MyChart) Due to this being a  telephonic visit, the after visit summary with patients personalized plan was offered to patient via MyChart   Nurse Notes: Patient aware of current care gaps. Patient will discuss with PCP at her next appointment.

## 2024-05-08 NOTE — Patient Instructions (Signed)
 Ms. Worden,  Thank you for taking the time for your Medicare Wellness Visit. I appreciate your continued commitment to your health goals. Please review the care plan we discussed, and feel free to reach out if I can assist you further.  Please note that Annual Wellness Visits do not include a physical exam. Some assessments may be limited, especially if the visit was conducted virtually. If needed, we may recommend an in-person follow-up with your provider.  Ongoing Care Seeing your primary care provider every 3 to 6 months helps us  monitor your health and provide consistent, personalized care.   Referrals If a referral was made during today's visit and you haven't received any updates within two weeks, please contact the referred provider directly to check on the status.  Recommended Screenings:  Health Maintenance  Topic Date Due   COVID-19 Vaccine (3 - Pfizer risk series) 03/20/2020   DTaP/Tdap/Td vaccine (2 - Td or Tdap) 07/26/2022   Yearly kidney function blood test for diabetes  08/06/2023   Osteoporosis screening with Bone Density Scan  Never done   Medicare Annual Wellness Visit  11/09/2023   Flu Shot  12/30/2023   Eye exam for diabetics  03/01/2024   Complete foot exam   04/11/2024   Pneumococcal Vaccine for age over 14 (2 of 2 - PCV) 12/06/2024*   Zoster (Shingles) Vaccine (2 of 2) 12/06/2024*   Hemoglobin A1C  06/07/2024   Yearly kidney health urinalysis for diabetes  12/05/2024   Breast Cancer Screening  05/16/2025   Hepatitis C Screening  Completed   HIV Screening  Completed   Hepatitis B Vaccine  Aged Out   Meningitis B Vaccine  Aged Out   Cologuard (Stool DNA test)  Discontinued  *Topic was postponed. The date shown is not the original due date.       05/08/2024    4:21 PM  Advanced Directives  Does Patient Have a Medical Advance Directive? Yes  Type of Estate Agent of [place];Living will  Does patient want to make changes to medical  advance directive? No - Patient declined  Copy of Healthcare Power of Attorney in Chart? No - copy requested    Vision: Annual vision screenings are recommended for early detection of glaucoma, cataracts, and diabetic retinopathy. These exams can also reveal signs of chronic conditions such as diabetes and high blood pressure.  Dental: Annual dental screenings help detect early signs of oral cancer, gum disease, and other conditions linked to overall health, including heart disease and diabetes.  Please see the attached documents for additional preventive care recommendations.

## 2024-05-09 ENCOUNTER — Other Ambulatory Visit: Payer: Self-pay | Admitting: Family

## 2024-05-09 ENCOUNTER — Other Ambulatory Visit: Payer: Self-pay

## 2024-05-09 ENCOUNTER — Ambulatory Visit: Payer: Self-pay | Admitting: Internal Medicine

## 2024-05-09 LAB — LIPID PANEL
Chol/HDL Ratio: 3.3 ratio (ref 0.0–4.4)
Cholesterol, Total: 154 mg/dL (ref 100–199)
HDL: 47 mg/dL (ref >39)
LDL Chol Calc (NIH): 80 mg/dL (ref 0–99)
Triglycerides: 160 mg/dL — ABNORMAL HIGH (ref 0–149)
VLDL Cholesterol Cal: 27 mg/dL (ref 5–40)

## 2024-05-09 LAB — COMPREHENSIVE METABOLIC PANEL WITH GFR
ALT: 25 IU/L (ref 0–32)
AST: 20 IU/L (ref 0–40)
Albumin: 4 g/dL (ref 3.9–4.9)
Alkaline Phosphatase: 123 IU/L (ref 49–135)
BUN/Creatinine Ratio: 9 — ABNORMAL LOW (ref 12–28)
BUN: 12 mg/dL (ref 8–27)
Bilirubin Total: 0.7 mg/dL (ref 0.0–1.2)
CO2: 25 mmol/L (ref 20–29)
Calcium: 9.3 mg/dL (ref 8.7–10.3)
Chloride: 97 mmol/L (ref 96–106)
Creatinine, Ser: 1.28 mg/dL — ABNORMAL HIGH (ref 0.57–1.00)
Globulin, Total: 3.4 g/dL (ref 1.5–4.5)
Glucose: 410 mg/dL — ABNORMAL HIGH (ref 70–99)
Potassium: 3.8 mmol/L (ref 3.5–5.2)
Sodium: 137 mmol/L (ref 134–144)
Total Protein: 7.4 g/dL (ref 6.0–8.5)
eGFR: 46 mL/min/1.73 — ABNORMAL LOW (ref >59)

## 2024-05-09 LAB — CBC
Hematocrit: 43.7 % (ref 34.0–46.6)
Hemoglobin: 13.3 g/dL (ref 11.1–15.9)
MCH: 28.2 pg (ref 26.6–33.0)
MCHC: 30.4 g/dL — ABNORMAL LOW (ref 31.5–35.7)
MCV: 93 fL (ref 79–97)
Platelets: 256 x10E3/uL (ref 150–450)
RBC: 4.72 x10E6/uL (ref 3.77–5.28)
RDW: 14.3 % (ref 11.7–15.4)
WBC: 10.3 x10E3/uL (ref 3.4–10.8)

## 2024-05-10 ENCOUNTER — Emergency Department (HOSPITAL_COMMUNITY)

## 2024-05-10 ENCOUNTER — Inpatient Hospital Stay (HOSPITAL_COMMUNITY)
Admission: EM | Admit: 2024-05-10 | Discharge: 2024-05-13 | DRG: 312 | Disposition: A | Discharge status: Home / Self Care | Source: Ambulatory Visit | Attending: Internal Medicine | Admitting: Internal Medicine

## 2024-05-10 ENCOUNTER — Encounter: Payer: Self-pay | Admitting: Internal Medicine

## 2024-05-10 ENCOUNTER — Ambulatory Visit: Attending: Internal Medicine | Admitting: Internal Medicine

## 2024-05-10 ENCOUNTER — Other Ambulatory Visit: Payer: Self-pay | Admitting: Internal Medicine

## 2024-05-10 ENCOUNTER — Other Ambulatory Visit: Payer: Self-pay

## 2024-05-10 ENCOUNTER — Encounter (HOSPITAL_COMMUNITY): Payer: Self-pay

## 2024-05-10 VITALS — BP 112/72 | HR 89 | Temp 98.2°F | Ht 64.0 in | Wt 326.0 lb

## 2024-05-10 DIAGNOSIS — E785 Hyperlipidemia, unspecified: Secondary | ICD-10-CM | POA: Diagnosis present

## 2024-05-10 DIAGNOSIS — Z9861 Coronary angioplasty status: Secondary | ICD-10-CM

## 2024-05-10 DIAGNOSIS — Z807 Family history of other malignant neoplasms of lymphoid, hematopoietic and related tissues: Secondary | ICD-10-CM

## 2024-05-10 DIAGNOSIS — M25511 Pain in right shoulder: Secondary | ICD-10-CM | POA: Diagnosis present

## 2024-05-10 DIAGNOSIS — N3946 Mixed incontinence: Secondary | ICD-10-CM | POA: Diagnosis not present

## 2024-05-10 DIAGNOSIS — I10 Essential (primary) hypertension: Secondary | ICD-10-CM | POA: Diagnosis present

## 2024-05-10 DIAGNOSIS — E119 Type 2 diabetes mellitus without complications: Secondary | ICD-10-CM

## 2024-05-10 DIAGNOSIS — Z7984 Long term (current) use of oral hypoglycemic drugs: Secondary | ICD-10-CM

## 2024-05-10 DIAGNOSIS — N390 Urinary tract infection, site not specified: Secondary | ICD-10-CM | POA: Diagnosis present

## 2024-05-10 DIAGNOSIS — N1832 Chronic kidney disease, stage 3b: Secondary | ICD-10-CM | POA: Diagnosis present

## 2024-05-10 DIAGNOSIS — E1122 Type 2 diabetes mellitus with diabetic chronic kidney disease: Secondary | ICD-10-CM | POA: Diagnosis present

## 2024-05-10 DIAGNOSIS — Z7982 Long term (current) use of aspirin: Secondary | ICD-10-CM

## 2024-05-10 DIAGNOSIS — Z9841 Cataract extraction status, right eye: Secondary | ICD-10-CM

## 2024-05-10 DIAGNOSIS — I252 Old myocardial infarction: Secondary | ICD-10-CM

## 2024-05-10 DIAGNOSIS — R159 Full incontinence of feces: Secondary | ICD-10-CM

## 2024-05-10 DIAGNOSIS — Z90722 Acquired absence of ovaries, bilateral: Secondary | ICD-10-CM

## 2024-05-10 DIAGNOSIS — Z794 Long term (current) use of insulin: Secondary | ICD-10-CM

## 2024-05-10 DIAGNOSIS — E113293 Type 2 diabetes mellitus with mild nonproliferative diabetic retinopathy without macular edema, bilateral: Secondary | ICD-10-CM | POA: Diagnosis present

## 2024-05-10 DIAGNOSIS — Z888 Allergy status to other drugs, medicaments and biological substances status: Secondary | ICD-10-CM

## 2024-05-10 DIAGNOSIS — Z9842 Cataract extraction status, left eye: Secondary | ICD-10-CM

## 2024-05-10 DIAGNOSIS — I2510 Atherosclerotic heart disease of native coronary artery without angina pectoris: Secondary | ICD-10-CM | POA: Diagnosis present

## 2024-05-10 DIAGNOSIS — E1169 Type 2 diabetes mellitus with other specified complication: Secondary | ICD-10-CM

## 2024-05-10 DIAGNOSIS — N179 Acute kidney failure, unspecified: Secondary | ICD-10-CM | POA: Diagnosis present

## 2024-05-10 DIAGNOSIS — I4721 Torsades de pointes: Secondary | ICD-10-CM | POA: Diagnosis present

## 2024-05-10 DIAGNOSIS — E876 Hypokalemia: Secondary | ICD-10-CM | POA: Diagnosis present

## 2024-05-10 DIAGNOSIS — Z6841 Body Mass Index (BMI) 40.0 and over, adult: Secondary | ICD-10-CM

## 2024-05-10 DIAGNOSIS — Z8619 Personal history of other infectious and parasitic diseases: Secondary | ICD-10-CM

## 2024-05-10 DIAGNOSIS — M1990 Unspecified osteoarthritis, unspecified site: Secondary | ICD-10-CM | POA: Diagnosis present

## 2024-05-10 DIAGNOSIS — Z8249 Family history of ischemic heart disease and other diseases of the circulatory system: Secondary | ICD-10-CM

## 2024-05-10 DIAGNOSIS — Z972 Presence of dental prosthetic device (complete) (partial): Secondary | ICD-10-CM

## 2024-05-10 DIAGNOSIS — B9620 Unspecified Escherichia coli [E. coli] as the cause of diseases classified elsewhere: Secondary | ICD-10-CM | POA: Diagnosis present

## 2024-05-10 DIAGNOSIS — F32A Depression, unspecified: Secondary | ICD-10-CM | POA: Diagnosis present

## 2024-05-10 DIAGNOSIS — Z9221 Personal history of antineoplastic chemotherapy: Secondary | ICD-10-CM

## 2024-05-10 DIAGNOSIS — K529 Noninfective gastroenteritis and colitis, unspecified: Secondary | ICD-10-CM | POA: Diagnosis not present

## 2024-05-10 DIAGNOSIS — Z8542 Personal history of malignant neoplasm of other parts of uterus: Secondary | ICD-10-CM

## 2024-05-10 DIAGNOSIS — Z833 Family history of diabetes mellitus: Secondary | ICD-10-CM

## 2024-05-10 DIAGNOSIS — Z8673 Personal history of transient ischemic attack (TIA), and cerebral infarction without residual deficits: Secondary | ICD-10-CM

## 2024-05-10 DIAGNOSIS — Z8544 Personal history of malignant neoplasm of other female genital organs: Secondary | ICD-10-CM

## 2024-05-10 DIAGNOSIS — I152 Hypertension secondary to endocrine disorders: Secondary | ICD-10-CM

## 2024-05-10 DIAGNOSIS — H35033 Hypertensive retinopathy, bilateral: Secondary | ICD-10-CM | POA: Diagnosis present

## 2024-05-10 DIAGNOSIS — Z7985 Long-term (current) use of injectable non-insulin antidiabetic drugs: Secondary | ICD-10-CM

## 2024-05-10 DIAGNOSIS — Z9181 History of falling: Secondary | ICD-10-CM

## 2024-05-10 DIAGNOSIS — R55 Syncope and collapse: Principal | ICD-10-CM | POA: Diagnosis present

## 2024-05-10 DIAGNOSIS — Z923 Personal history of irradiation: Secondary | ICD-10-CM

## 2024-05-10 DIAGNOSIS — M25512 Pain in left shoulder: Secondary | ICD-10-CM | POA: Diagnosis present

## 2024-05-10 DIAGNOSIS — I1310 Hypertensive heart and chronic kidney disease without heart failure, with stage 1 through stage 4 chronic kidney disease, or unspecified chronic kidney disease: Secondary | ICD-10-CM | POA: Diagnosis present

## 2024-05-10 DIAGNOSIS — Z713 Dietary counseling and surveillance: Secondary | ICD-10-CM

## 2024-05-10 DIAGNOSIS — E1142 Type 2 diabetes mellitus with diabetic polyneuropathy: Secondary | ICD-10-CM | POA: Diagnosis present

## 2024-05-10 DIAGNOSIS — Z79899 Other long term (current) drug therapy: Secondary | ICD-10-CM

## 2024-05-10 DIAGNOSIS — Z90710 Acquired absence of both cervix and uterus: Secondary | ICD-10-CM

## 2024-05-10 DIAGNOSIS — I4729 Other ventricular tachycardia: Secondary | ICD-10-CM | POA: Diagnosis not present

## 2024-05-10 DIAGNOSIS — Z800 Family history of malignant neoplasm of digestive organs: Secondary | ICD-10-CM

## 2024-05-10 DIAGNOSIS — I253 Aneurysm of heart: Secondary | ICD-10-CM | POA: Diagnosis present

## 2024-05-10 DIAGNOSIS — I3139 Other pericardial effusion (noninflammatory): Secondary | ICD-10-CM | POA: Diagnosis present

## 2024-05-10 DIAGNOSIS — Z9889 Other specified postprocedural states: Secondary | ICD-10-CM

## 2024-05-10 LAB — COMPREHENSIVE METABOLIC PANEL WITH GFR
ALT: 27 U/L (ref 0–44)
AST: 26 U/L (ref 15–41)
Albumin: 3.7 g/dL (ref 3.5–5.0)
Alkaline Phosphatase: 109 U/L (ref 38–126)
Anion gap: 9 (ref 5–15)
BUN: 22 mg/dL (ref 8–23)
CO2: 26 mmol/L (ref 22–32)
Calcium: 9.5 mg/dL (ref 8.9–10.3)
Chloride: 104 mmol/L (ref 98–111)
Creatinine, Ser: 1.44 mg/dL — ABNORMAL HIGH (ref 0.44–1.00)
GFR, Estimated: 40 mL/min — ABNORMAL LOW (ref >60)
Glucose, Bld: 79 mg/dL (ref 70–99)
Potassium: 3.5 mmol/L (ref 3.5–5.1)
Sodium: 139 mmol/L (ref 135–145)
Total Bilirubin: 0.7 mg/dL (ref 0.0–1.2)
Total Protein: 7.1 g/dL (ref 6.5–8.1)

## 2024-05-10 LAB — CBG MONITORING, ED
Glucose-Capillary: 142 mg/dL — ABNORMAL HIGH (ref 70–99)
Glucose-Capillary: 86 mg/dL (ref 70–99)

## 2024-05-10 LAB — POCT GLYCOSYLATED HEMOGLOBIN (HGB A1C): HbA1c, POC (controlled diabetic range): 10.5 % — AB (ref 0.0–7.0)

## 2024-05-10 LAB — CBC
HCT: 36.3 % (ref 36.0–46.0)
Hemoglobin: 11.8 g/dL — ABNORMAL LOW (ref 12.0–15.0)
MCH: 29.1 pg (ref 26.0–34.0)
MCHC: 32.5 g/dL (ref 30.0–36.0)
MCV: 89.4 fL (ref 80.0–100.0)
Platelets: 235 K/uL (ref 150–400)
RBC: 4.06 MIL/uL (ref 3.87–5.11)
RDW: 15.2 % (ref 11.5–15.5)
WBC: 10.5 K/uL (ref 4.0–10.5)
nRBC: 0 % (ref 0.0–0.2)

## 2024-05-10 LAB — GLUCOSE, CAPILLARY: Glucose-Capillary: 149 mg/dL — ABNORMAL HIGH (ref 70–99)

## 2024-05-10 LAB — TROPONIN T, HIGH SENSITIVITY
Troponin T High Sensitivity: 20 ng/L — ABNORMAL HIGH (ref 0–19)
Troponin T High Sensitivity: 20 ng/L — ABNORMAL HIGH (ref 0–19)

## 2024-05-10 LAB — PRO BRAIN NATRIURETIC PEPTIDE: Pro Brain Natriuretic Peptide: 198 pg/mL (ref <300.0)

## 2024-05-10 LAB — GLUCOSE, POCT (MANUAL RESULT ENTRY)
POC Glucose: 256 mg/dL — AB (ref 70–99)
POC Glucose: 180 mg/dL — AB (ref 70–99)

## 2024-05-10 MED ORDER — INSULIN ASPART 100 UNIT/ML IJ SOLN
0.0000 [IU] | Freq: Every day | INTRAMUSCULAR | Status: DC
  Administered 2024-05-11: 3 [IU] via SUBCUTANEOUS
  Administered 2024-05-12: 2 [IU] via SUBCUTANEOUS
  Filled 2024-05-10: qty 3
  Filled 2024-05-10: qty 2

## 2024-05-10 MED ORDER — LANTUS SOLOSTAR 100 UNIT/ML ~~LOC~~ SOPN
64.0000 [IU] | PEN_INJECTOR | Freq: Every day | SUBCUTANEOUS | 1 refills | Status: AC
  Filled 2024-05-10 – 2024-05-25 (×2): qty 60, 93d supply, fill #0

## 2024-05-10 MED ORDER — ONDANSETRON HCL 4 MG/2ML IJ SOLN: 4.0000 mg | Freq: Four times a day (QID) | INTRAMUSCULAR | Status: DC | PRN

## 2024-05-10 MED ORDER — LEVETIRACETAM (KEPPRA) 500 MG/5 ML ADULT IV PUSH
1000.0000 mg | Freq: Once | INTRAVENOUS | Status: AC
  Administered 2024-05-10: 1000 mg via INTRAVENOUS
  Filled 2024-05-10: qty 10

## 2024-05-10 MED ORDER — ENOXAPARIN SODIUM 40 MG/0.4ML IJ SOSY
40.0000 mg | PREFILLED_SYRINGE | INTRAMUSCULAR | Status: DC
  Administered 2024-05-11 – 2024-05-13 (×3): 40 mg via SUBCUTANEOUS
  Filled 2024-05-10 (×3): qty 0.4

## 2024-05-10 MED ORDER — OZEMPIC (0.25 OR 0.5 MG/DOSE) 2 MG/3ML ~~LOC~~ SOPN
0.2500 mg | PEN_INJECTOR | SUBCUTANEOUS | 1 refills | Status: DC
  Filled 2024-05-10: qty 3, 56d supply, fill #0

## 2024-05-10 MED ORDER — CARVEDILOL 25 MG PO TABS
25.0000 mg | ORAL_TABLET | Freq: Two times a day (BID) | ORAL | 1 refills | Status: AC
  Filled 2024-05-10: qty 180, 90d supply, fill #0

## 2024-05-10 MED ORDER — ONDANSETRON HCL 4 MG PO TABS: 4.0000 mg | ORAL_TABLET | Freq: Four times a day (QID) | ORAL | Status: DC | PRN

## 2024-05-10 MED ORDER — INSULIN LISPRO (1 UNIT DIAL) 100 UNIT/ML (KWIKPEN): 28.0000 [IU] | PEN_INJECTOR | Freq: Two times a day (BID) | SUBCUTANEOUS | 1 refills | Status: DC

## 2024-05-10 MED ORDER — IOHEXOL 350 MG/ML SOLN
60.0000 mL | Freq: Once | INTRAVENOUS | Status: AC | PRN
  Administered 2024-05-10: 60 mL via INTRAVENOUS

## 2024-05-10 MED ORDER — INSULIN ASPART 100 UNIT/ML IJ SOLN
0.0000 [IU] | Freq: Three times a day (TID) | INTRAMUSCULAR | Status: DC
  Administered 2024-05-11 (×2): 4 [IU] via SUBCUTANEOUS
  Administered 2024-05-12: 7 [IU] via SUBCUTANEOUS
  Administered 2024-05-12: 11 [IU] via SUBCUTANEOUS
  Administered 2024-05-12 – 2024-05-13 (×2): 7 [IU] via SUBCUTANEOUS
  Administered 2024-05-13: 11 [IU] via SUBCUTANEOUS
  Filled 2024-05-10: qty 4
  Filled 2024-05-10: qty 7
  Filled 2024-05-10: qty 4
  Filled 2024-05-10 (×2): qty 7
  Filled 2024-05-10 (×2): qty 11

## 2024-05-10 MED ORDER — SODIUM CHLORIDE 0.9% FLUSH
3.0000 mL | Freq: Two times a day (BID) | INTRAVENOUS | Status: DC
  Administered 2024-05-11 – 2024-05-12 (×4): 3 mL via INTRAVENOUS

## 2024-05-10 MED ORDER — INSULIN LISPRO (1 UNIT DIAL) 100 UNIT/ML (KWIKPEN)
34.0000 [IU] | PEN_INJECTOR | Freq: Two times a day (BID) | SUBCUTANEOUS | 1 refills | Status: DC
  Filled 2024-05-10 – 2024-05-11 (×3): qty 60, 88d supply, fill #0

## 2024-05-10 NOTE — Progress Notes (Signed)
 Patient ID: Kimberly Frazier, female    DOB: 07-02-1960  MRN: [id number]  CC: Diabetes (DM F/U)   Subjective: Kimberly Frazier is a 63 y.o. female who presents for chronic ds management. Daughter Darice and small grand-daughter are with her. Her concerns today include:  Patient with history of DM with neuropathy and retinopathy, HTN with hypertensive heart disease, CAD (Coronary CT revealed occlusion in the mid to distal LAD, distal RCA.  Cardiology states that she will need CABG eventually), HL, obesity, endometrial CA s/p hysterectomy   Discussed the use of AI scribe software for clinical note transcription with the patient, who gave verbal consent to proceed.  History of Present Illness Kimberly Frazier is a 63 year old female with diabetes, hypertension, and heart disease who presents for chronic disease management.  DM: Results for orders placed or performed in visit on 05/10/24  POCT glycosylated hemoglobin (Hb A1C)   Collection Time: 05/10/24  4:05 PM  Result Value Ref Range   Hemoglobin A1C     HbA1c POC (<> result, manual entry)     HbA1c, POC (prediabetic range)     HbA1c, POC (controlled diabetic range) 10.5 (A) 0.0 - 7.0 %  POCT glucose (manual entry)   Collection Time: 05/10/24  4:05 PM  Result Value Ref Range   POC Glucose 256 (A) 70 - 99 mg/dl   *Note: Due to a large number of results and/or encounters for the requested time period, some results have not been displayed. A complete set of results can be found in Results Review.  Her diabetes management has been inconsistent over the past month, with an A1c of 10.5, up from 10.3 four months ago. Blood sugars frequently exceed 200 mg/dL; forgot to bring her log with her today. She is prescribed Humalog  34 units with the 2 largest meals of the day and Lantus  insulin  64 units daily but has been inconsistent with dosing. She consumes fast food and three glasses of Coca Cola daily, which she believes contributes to her high blood sugar  levels. She has concerns about vision changes potentially related to Ozempic , which she previously discontinued. She receives monthly eye injections for her only functional eye and is cautious about medications affecting her vision.  CAD/HTN: For her heart disease and hypertension, she is taking isosorbide  60 mg daily, atorvastatin  80 mg daily, amlodipine  10 mg daily, potassium supplements, Coreg  25 mg twice daily, Zetia  10 mg daily, and aspirin  81 mg daily. She experiences chest pain and shortness of breath, associated with a recent cold, and a dry cough persisting for about a month. She uses cough drops and Tylenol  cold medicine for relief.  On recent telehealth visit with me 3 days ago, patient complained of urinary and bowel incontinence x 3 to 4 weeks that was associated with dysuria.  She described her bowel incontinence as diarrhea with loose slimy stools occurring at least 5 times per day including nocturnal episodes.  No blood in the stools.   Daughter tells me today that she has been experiencing diarrhea and bowel incontinence for the better part of the year, with frequent bowel movements after eating or drinking just about anything. She turned in sample for stool culture today.She also reports bladder incontinence and has been using Cipro  for a urinary infection, which has improved her dysuria but still has urinary incontinence.  Patient fell today while in the exam room in my presence.  She fell from the exam table onto the floor b/w the bed and  wall while attempting to slide off the table to stand up due to back pain. However, in attempting to do so she appeared to have a syncopal episode and was in a daze and not responding verbally for several mins though she did not lose consciousness.  Daughter reports that she has had 2 such episodes in the past several weeks with the last one occurring 2 weeks ago.  First episode patient was sitting down in the chair and then her eyes wandered off to the  side and she became incoherent and unaware of her surroundings.  This was followed by an episode of vomiting.  No shaking noted. The most recent episode occurred 2 weeks ago.  Patient states she had got up to use the bathroom and then woke up on the floor.  Daughter noted that she had a black eye.  Patient does not recall the details of the fall or whether she experienced any dizziness, chest pain or palpitations prior to it.    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   History of radiation therapy 11/28/2018   Diabetes mellitus (HCC) 10/06/2018   Type 2 diabetes mellitus with hyperglycemia, with long-term current use of insulin  (HCC) 10/06/2018   Adrenal adenoma, left 06/15/2018   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   Postmenopausal vaginal bleeding 06/15/2018   Hyperkalemia    Diabetic peripheral neuropathy (HCC) 09/03/2016   CAD (coronary artery disease) 04/10/2015   Neck pain 06/20/2014   Severe obesity (BMI >= 40) (HCC) 09/20/2013   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   Hyperlipidemia 10/25/2012   Diabetes mellitus type 2, uncontrolled, with complications 07/26/2012   Essential hypertension 07/26/2012     Medications Ordered Prior to Encounter[1]  Allergies[2]  Social History   Socioeconomic History   Marital status: Single    Spouse name: Not on file   Number of children: 2   Years of education: 12   Highest education level: GED or equivalent  Occupational History   Occupation: retired  Tobacco Use    Smoking status: Never   Smokeless tobacco: Never  Vaping Use   Vaping status: Never Used  Substance and Sexual Activity   Alcohol  use: Not Currently   Drug use: Not Currently   Sexual activity: Not Currently  Other Topics Concern   Not on file  Social History Narrative   Lives at home with her grandson.   Right-handed.   No daily caffeine  use.   Social Drivers of Health   Tobacco Use: Low Risk (05/10/2024)   Patient History    Smoking Tobacco Use: Never    Smokeless Tobacco Use: Never    Passive Exposure: Not on file  Financial Resource Strain: Low Risk (04/12/2023)   Overall Financial Resource Strain (CARDIA)    Difficulty of Paying Living Expenses: Not very hard  Food Insecurity: No Food Insecurity (05/08/2024)   Epic    Worried About Programme Researcher, Broadcasting/film/video in the Last Year: Never true    Ran Out of Food in the Last Year: Never true  Transportation Needs: Unmet  Transportation Needs (05/08/2024)   Epic    Lack of Transportation (Medical): Yes    Lack of Transportation (Non-Medical): Yes  Physical Activity: Inactive (05/08/2024)   Exercise Vital Sign    Days of Exercise per Week: 0 days    Minutes of Exercise per Session: 0 min  Stress: Stress Concern Present (05/08/2024)   [employer] of Occupational Health - Occupational Stress Questionnaire    Feeling of Stress: To some extent  Social Connections: Socially Isolated (05/08/2024)   Social Connection and Isolation Panel    Frequency of Communication with Friends and Family: More than three times a week    Frequency of Social Gatherings with Friends and Family: Once a week    Attends Religious Services: Never    Database Administrator or Organizations: No    Attends Banker Meetings: Never    Marital Status: Never married  Intimate Partner Violence: Not At Risk (05/08/2024)   Epic    Fear of Current or Ex-Partner: No    Emotionally Abused: No    Physically Abused: No    Sexually Abused: No  Depression  (PHQ2-9): Low Risk (05/08/2024)   Depression (PHQ2-9)    PHQ-2 Score: 2  Alcohol  Screen: Low Risk (05/08/2024)   Alcohol  Screen    Last Alcohol  Screening Score (AUDIT): 0  Housing: Low Risk (05/08/2024)   Epic    Unable to Pay for Housing in the Last Year: No    Number of Times Moved in the Last Year: 0    Homeless in the Last Year: No  Utilities: Not At Risk (05/08/2024)   Epic    Threatened with loss of utilities: No  Health Literacy: Adequate Health Literacy (05/08/2024)   B1300 Health Literacy    Frequency of need for help with medical instructions: Never    Family History  Problem Relation Age of Onset   Heart disease Mother    Hypertension Mother    Diabetes Mother    Cancer Father        hodgkins lymphoma   Heart disease Sister        heart attack   Diabetes Sister    Colon cancer Brother 52   Diabetes Maternal Aunt    Diabetes Maternal Uncle    Cancer Other        parent   Diabetes Other        parent   Heart disease Other        parent   Hyperlipidemia Other        parent   Hypertension Other        parent   Arthritis Other        parent   Breast cancer Neg Hx    Colon polyps Neg Hx    Esophageal cancer Neg Hx    Rectal cancer Neg Hx    Stomach cancer Neg Hx     Past Surgical History:  Procedure Laterality Date   BREAST BIOPSY  2012   benign   CARDIAC CATHETERIZATION N/A 02/06/2015   Procedure: Left Heart Cath and Coronary Angiography;  Surgeon: Ezra GORMAN Shuck, MD;  Location: [facility] INVASIVE CV LAB;  Service: Cardiovascular;  Laterality: N/A;   CATARACT EXTRACTION Bilateral OD: 03/07/19, OS: 02/28/19   Dr. Roz   CATARACT EXTRACTION, BILATERAL     COLONOSCOPY     normal exam in [place], [place] abour 10-12 years ago   EYE SURGERY Bilateral OD: 03/07/19, OS: 02/28/19   Cat Sx - Dr.  Roz   HYSTEROSCOPY WITH D & C N/A 08/01/2018   Procedure: DILATATION AND CURETTAGE /HYSTEROSCOPY;  Surgeon: Alger Gong, MD;  Location: [facility];   Service: Gynecology;  Laterality: N/A;   ROBOTIC ASSISTED TOTAL HYSTERECTOMY WITH BILATERAL SALPINGO OOPHERECTOMY N/A 11/28/2018   Procedure: XI ROBOTIC ASSISTED TOTAL HYSTERECTOMY WITH BILATERAL SALPINGO OOPHORECTOMY;  Surgeon: Eloy Herring, MD;  Location: [facility];  Service: Gynecology;  Laterality: N/A;   SENTINEL NODE BIOPSY N/A 11/28/2018   Procedure: SENTINEL NODE BIOPSY;  Surgeon: Eloy Herring, MD;  Location: [facility];  Service: Gynecology;  Laterality: N/A;   UMBILICAL HERNIA REPAIR  child    ROS: Review of Systems Negative except as stated above  PHYSICAL EXAM: BP 112/72   Pulse 89   Temp 98.2 F (36.8 C) (Oral)   Ht 5' 4 (1.626 m)   Wt (!) 326 lb (147.9 kg)   SpO2 98%   BMI 55.96 kg/m   Wt Readings from Last 3 Encounters:  05/10/24 (!) 326 lb (147.9 kg)  05/08/24 (!) 318 lb (144.2 kg)  01/23/24 (!) 319 lb 2 oz (144.8 kg)    Physical Exam  General appearance - alert, well appearing, older AAF and in no distress Mental status -patient was alert and oriented and answering questions appropriately.  She was trying to slide off the exam table but ended up falling off the exam table between the table and the wall.  She appeared to have blacked out.  For about 3 minutes she was staring off into space and same underwear of what was going on around her but did not completely lose consciousness.  She did not have any shaking of the body or the extremities.  When she started talking again, we told her that she had fallen and patient did not realize that she did.  She is not sure whether she experienced any dizziness or palpitations prior to this syncope.  She was moving all 4 extremities after she came to fully.  Repeat blood sugars was in the 180s.  Repeat blood pressure was in the 140s systolic with pulse rate in the 90s Mouth: Oral mucosa was moist  chest - clear to auscultation, no wheezes, rales or rhonchi, symmetric air entry Heart - normal rate, regular rhythm, normal S1, S2, no  murmurs, rubs, clicks or gallops Extremities -no lower extremity edema.      Latest Ref Rng & Units 05/08/2024   12:24 PM 08/06/2022   11:25 AM 01/04/2022    9:06 AM  CMP  Glucose 70 - 99 mg/dL 589  871  623   BUN 8 - 27 mg/dL 12  14  23    Creatinine 0.57 - 1.00 mg/dL 8.71  8.87  8.66   Sodium 134 - 144 mmol/L 137  144  139   Potassium 3.5 - 5.2 mmol/L 3.8  3.8  3.9   Chloride 96 - 106 mmol/L 97  104  100   CO2 20 - 29 mmol/L 25  27  24    Calcium  8.7 - 10.3 mg/dL 9.3  9.2  9.8   Total Protein 6.0 - 8.5 g/dL 7.4  7.3  7.5   Total Bilirubin 0.0 - 1.2 mg/dL 0.7  0.7  0.6   Alkaline Phos 49 - 135 IU/L 123  140  139   AST 0 - 40 IU/L 20  17  16    ALT 0 - 32 IU/L 25  18  21     Lipid Panel  Component Value Date/Time   CHOL 154 05/08/2024 1224   TRIG 160 (H) 05/08/2024 1224   HDL 47 05/08/2024 1224   CHOLHDL 3.3 05/08/2024 1224   CHOLHDL 5 07/15/2015 1513   VLDL 44.0 (H) 07/15/2015 1513   LDLCALC 80 05/08/2024 1224   LDLDIRECT 93.0 07/15/2015 1513    CBC    Component Value Date/Time   WBC 10.3 05/08/2024 1224   WBC 10.9 (H) 03/24/2020 1427   RBC 4.72 05/08/2024 1224   RBC 4.50 03/24/2020 1427   HGB 13.3 05/08/2024 1224   HCT 43.7 05/08/2024 1224   PLT 256 05/08/2024 1224   MCV 93 05/08/2024 1224   MCH 28.2 05/08/2024 1224   MCH 28.2 03/24/2020 1427   MCHC 30.4 (L) 05/08/2024 1224   MCHC 32.2 03/24/2020 1427   RDW 14.3 05/08/2024 1224   LYMPHSABS 1.0 12/30/2017 0812   MONOABS 1.3 (H) 12/30/2017 0812   EOSABS 0.1 12/30/2017 0812   BASOSABS 0.0 12/30/2017 0812    ASSESSMENT AND PLAN: 1. Syncope and collapse (Primary) Patient presented today for routine follow-up visit and had a fall from the exam table onto the floor while attempting to slide off the exam table.  patient appeared to have had a syncopal episode as once she had fallen, she remained dazed and not verbally responding for about 3 minutes though she did not lose consciousness.  Patient denied any feelings of  palpitations or chest pains prior to this episode.  We were able to get her up in a chair and patient seem to return to her baseline. -According to her daughter she has had 2 similar episodes over the past several weeks 1 of which occurred 2 weeks ago where she blacked out in the bathroom and sustained bruised eye. EMS was called and patient was taken to the emergency room for further evaluation. -Differential diagnoses include cardiac arrhythmia versus seizure. -Will submit referral to cardiology and neurology. - Ambulatory referral to Neurology - Ambulatory referral to Cardiology  2. History of recent fall Daughter has requested bedside commode for her given recent falls and urinary/fecal incontinence.  Will submit to [facility] - DME Bedside commode  3. Type 2 diabetes mellitus with morbid obesity (HCC) Not at goal.  Patient has been inconsistent with taking her insulins.  Eating habits are also poor.  She is willing to commit to cutting back to 8 ounce glass of soda a day instead of 3 glasses and will try to cut back on fast foods. She is also willing to commit to taking her insulins consistently. We discussed trying her with low-dose Ozempic  to help curb her appetite and possibly bring about a little bit of weight loss.  But on second thought I think I will hold off on this given that she is currently dealing with some chronic diarrhea issues. - POCT glycosylated hemoglobin (Hb A1C) - POCT glucose (manual entry) - insulin  glargine (LANTUS  SOLOSTAR) 100 UNIT/ML Solostar Pen; Inject 64 Units into the skin daily.  Dispense: 60 mL; Refill: 1 - insulin  lispro (HUMALOG  KWIKPEN) 100 UNIT/ML KwikPen; Inject 34 units into the skin twice daily with lunch and dinner  Dispense: 60 mL; Refill: 1  4. Hypertension associated with diabetes (HCC) Blood pressure at goal.  She will continue the carvedilol  25 mg twice a day, isosorbide  60 mg daily, amlodipine  10 mg daily, - carvedilol  (COREG ) 25 MG tablet;  Take 1 tablet (25 mg total) by mouth 2 (two) times daily with a meal.  Dispense: 180 tablet;  Refill: 1  5. Coronary artery disease involving native coronary artery of native heart without angina pectoris Continue carvedilol , isosorbide , aspirin , atorvastatin  and Zetia . - carvedilol  (COREG ) 25 MG tablet; Take 1 tablet (25 mg total) by mouth 2 (two) times daily with a meal.  Dispense: 180 tablet; Refill: 1 - Ambulatory referral to Cardiology  6. Chronic diarrhea Patient has turned in a stool sample today which we will check for C. difficile, O&P and routine culture.  Advised in the meantime that she can try Imodium  and take as needed. Diet may be playing a role.  Advised to cut back on sugary drinks and snacks - Ambulatory referral to Gastroenterology  7. Incontinence of feces, unspecified fecal incontinence type Will send prescription to [facility] for depends underwear and pads.  Given 3 episodes of recent falls/syncope, 1 of which occurred in the bathroom, we will try to get her a bedside commode - For home use only DME Other see comment - DME Bedside commode  8. Urinary incontinence, mixed See #7 above - For home use only DME Other see comment - DME Bedside commode     Patient was given the opportunity to ask questions.  Patient verbalized understanding of the plan and was able to repeat key elements of the plan.   This documentation was completed using Paediatric nurse.  Any transcriptional errors are unintentional.  Orders Placed This Encounter  Procedures   For home use only DME Other see comment   DME Bedside commode   Ambulatory referral to Gastroenterology   Ambulatory referral to Neurology   Ambulatory referral to Cardiology   POCT glycosylated hemoglobin (Hb A1C)   POCT glucose (manual entry)     Requested Prescriptions   Signed Prescriptions Disp Refills   Semaglutide ,0.25 or 0.5MG /DOS, (OZEMPIC , 0.25 OR 0.5 MG/DOSE,) 2 MG/3ML SOPN 3 mL 1     Sig: Inject 0.25 mg into the skin once a week.   insulin  glargine (LANTUS  SOLOSTAR) 100 UNIT/ML Solostar Pen 60 mL 1    Sig: Inject 64 Units into the skin daily.   carvedilol  (COREG ) 25 MG tablet 180 tablet 1    Sig: Take 1 tablet (25 mg total) by mouth 2 (two) times daily with a meal.   insulin  lispro (HUMALOG  KWIKPEN) 100 UNIT/ML KwikPen 60 mL 1    Sig: Inject 34 Units into the skin in the morning and at bedtime.    No follow-ups on file.  Barnie Louder, MD, FACP    [1]  Current Outpatient Medications on File Prior to Visit  Medication Sig Dispense Refill   Accu-Chek Softclix Lancets lancets Use to check blood sugar 3 times daily. 100 each 6   acetaminophen  (TYLENOL ) 500 MG tablet Take 2 tablets (1,000 mg total) by mouth every 6 (six) hours as needed. 100 tablet 2   amLODipine  (NORVASC ) 10 MG tablet Take 1 tablet (10 mg total) by mouth daily. 90 tablet 1   aspirin  EC 81 MG tablet Take 1 tablet (81 mg total) daily by mouth. 100 tablet 1   atorvastatin  (LIPITOR) 80 MG tablet Take 1 tablet (80 mg total) by mouth once daily. (PLEASE SCHEDULE AN APPT FOR FUTURE REFILLS) 90 tablet 1   Blood Glucose Monitoring Suppl (ACCU-CHEK GUIDE) w/Device KIT 1 kit by Does not apply route in the morning, at noon, and at bedtime. Use to check blood sugar 3 times daily. 1 kit 0   brimonidine  (ALPHAGAN ) 0.15 % ophthalmic solution Place 1 drop into both eyes 2 (  two) times daily. 10 mL 10   brimonidine  (ALPHAGAN ) 0.2 % ophthalmic solution Place 1 drop into both eyes 2 (two) times daily. 10 mL 6   ciprofloxacin  (CIPRO ) 500 MG tablet Take 1 tablet (500 mg total) by mouth 2 (two) times daily for 7 days. 14 tablet 0   Continuous Blood Gluc Receiver (FREESTYLE LIBRE 2 READER) DEVI Use as directed 1 each 0   Continuous Blood Gluc Sensor (FREESTYLE LIBRE SENSOR SYSTEM) MISC Change sensor every 2 weeks 2 each 12   diclofenac  Sodium (VOLTAREN ) 1 % GEL Apply 2 g topically 2 (two) times daily as needed. 100 g 1    dorzolamide -timolol  (COSOPT ) 2-0.5 % ophthalmic solution Place 1 drop into both eyes 2 (two) times daily. 10 mL 3   ezetimibe  (ZETIA ) 10 MG tablet Take 1 tablet (10 mg total) by mouth daily. PATIENT MUST CALL AND SCHEDULE OVERDUE ANNUAL APPOINTMENT FOR FURTHER REFILLS.FIRST ATTEMPT 15 tablet 0   glimepiride  (AMARYL ) 2 MG tablet Take 1 tablet (2 mg total) by mouth daily before breakfast. 90 tablet 0   glucose blood (ACCU-CHEK GUIDE) test strip Use to check blood sugar 3 times daily. 100 each 6   Insulin  Pen Needle (TECHLITE PEN NEEDLES) 32G X 4 MM MISC Use as directed. 300 each 2   isosorbide  mononitrate (IMDUR ) 60 MG 24 hr tablet Take 1 tablet (60 mg total) by mouth daily. 90 tablet 3   ketorolac  (ACULAR ) 0.5 % ophthalmic solution Place 1 drop into both eyes 4 (four) times daily. 10 mL 3   nitroGLYCERIN  (NITROSTAT ) 0.4 MG SL tablet Place 1 tab under tongue for chest pain.  May repeat after 5 minutes x 2.  DO NOT TAKE MORE THAN 3 TABS DURING AN EPISODE OF CHEST PAIN.Pt must schedule an overdue followup appt with Cardiology for any more refills. [phone] 1st attempt Thank You 25 tablet 0   omeprazole  (PRILOSEC) 20 MG capsule Take 1 capsule (20 mg total) by mouth daily. 30 capsule 0   potassium chloride  (KLOR-CON ) 10 MEQ tablet Take 2 tablets (20 mEq total) by mouth daily. 180 tablet 3   prednisoLONE  acetate (PRED FORTE ) 1 % ophthalmic suspension Place 1 drop into both eyes daily. 10 mL 1   tiZANidine  (ZANAFLEX ) 4 MG tablet Take 1 tablet (4 mg total) by mouth every 8 (eight) hours as needed for muscle spasms. 90 tablet 1   No current facility-administered medications on file prior to visit.  [2]  Allergies Allergen Reactions   Jardiance  [Empagliflozin ] Other (See Comments)    Frequent yeast infection   Metformin  And Related Diarrhea

## 2024-05-10 NOTE — Patient Instructions (Signed)
°  VISIT SUMMARY: Today, we reviewed your chronic conditions, including diabetes, hypertension, heart disease, and gastrointestinal issues. We discussed your current symptoms, medications, and made adjustments to your treatment plan to better manage your health.  YOUR PLAN:  Syncopy/Collapse: I am sending you to the ER because of your collapse here in the office and reported recent episodes of collapse.  -TYPE 2 DIABETES MELLITUS WITH HYPERGLYCEMIA AND MORBID OBESITY: Your blood sugar levels have been high, and your A1c has increased to 10.5. This is partly due to inconsistent insulin  use and dietary habits. We have adjusted your insulin  doses and started you on a low dose of Ozempic  to help control your blood sugar. Please monitor for any vision changes and gastrointestinal side effects. Try to reduce your Coca Cola intake to one glass per day and be consistent with your insulin  administration.  -HYPERTENSION ASSOCIATED WITH DIABETES: Your blood pressure is well-controlled at 112/72 mmHg. Continue taking your current medications as prescribed.  -ATHEROSCLEROTIC HEART DISEASE: You have not experienced recent chest pain or shortness of breath. Continue taking your current heart medications as prescribed.  -CHRONIC DIARRHEA AND BOWEL INCONTINENCE: You have been experiencing persistent diarrhea and bowel incontinence. We have sent stool cultures to check for infections and referred you to a gastroenterologist for further evaluation. You can use Imodium  as needed to manage diarrhea.  -URINARY TRACT INFECTION: Your symptoms are improving with the antibiotic Cipro . Continue taking Cipro  as prescribed.  INSTRUCTIONS: Please follow up with the gastroenterologist as referred for your chronic diarrhea and bowel incontinence. Continue monitoring your blood sugar levels and be consistent with your insulin  administration. Reduce your Coca Cola intake to one glass per day. Monitor for any vision changes or  gastrointestinal side effects from Ozempic  and report them immediately. Continue taking all your medications as prescribed. Follow up with us  in 3 months for a routine check-up and to review your lab results.                      Contains text generated by [employer].                                 Contains text generated by [employer].

## 2024-05-10 NOTE — H&P (Signed)
 History and Physical    Patient: Kimberly Frazier FMW:[id number] DOB: 1960-07-07 DOA: 05/10/2024 DOS: the patient was seen and examined on 05/10/2024 PCP: [name], MD  Patient coming from: Home  Chief Complaint:  Chief Complaint  Patient presents with   Loss of Consciousness   HPI: Kimberly Frazier is a 63 y.o. female with medical history significant of diabetes, hyperlipidemia, essential hypertension, morbid obesity, coronary artery disease status post previous PCI, chronic kidney disease stage IIIb, prior history of CVA who presented to the ER after episode of syncope.  Patient was at her doctor's office for routine follow-up where she described the syncopal episode about 3 in the last 3 months.  In the last 2 weeks, she has had 3 episodes.  She started having heaviness in her shoulders and the next day she realized she was on the ground again.  She was out for 5 minutes.  After the episode she did have some postictal state so there is worried that this could have been a seizure.  The description is that the patient was staring and glare in and then confused.  On arrival in the ER she is fully awake and alert.  She is complaining of neck and bilateral shoulder pain.  She does have heaviness in her chest.  Workup in the ER so far is unrevealing.  Neurology consulted with suspicion for seizure versus syncope.  Patient has both cardiac and neurologic past medical history at risk for syncope from either 1.  At this point we are admitting the patient for workup for possible syncope versus seizure  Review of Systems: As mentioned in the history of present illness. All other systems reviewed and are negative. Past Medical History:  Diagnosis Date   Adrenal adenoma, left    Arthritis    Cancer (HCC)    CKD (chronic kidney disease), stage II    Coronary artery disease 07-28-2018 [facility]  currently due to no insurance   per cardiac cath 02-06-2015 (positive mild  lateral ishcemia on stress test)--- dLAD 80%,  mLAD 40%,  mPDA 80% (small vessel),  ostial D1 70%,  CFx with lumial irregarlities-- medical management   Depression    Diabetic neuropathy (HCC)    Diabetic retinopathy (HCC)    NPDR OU   Headache    History of Bell's palsy 07/2011   per pt residual facial pain on left side occasionally   History of cancer of vagina 1999   per pt completed radiation and chemo   History of sepsis 12/30/2017   positive blood culter fro E.coli   Hyperlipidemia    Hypertension    Hypertensive retinopathy    OU   Hypokalemia    Insulin  dependent type 2 diabetes mellitus, uncontrolled    followed by pcp---  A1c was 11.7 on 06-15-2018 in epic   Myocardial infarction [facility])    10 yrs ago?   Nocturia    Peripheral neuropathy    PMB (postmenopausal bleeding)    Wears dentures    upper   Wears glasses    Past Surgical History:  Procedure Laterality Date   BREAST BIOPSY  2012   benign   CARDIAC CATHETERIZATION N/A 02/06/2015   Procedure: Left Heart Cath and Coronary Angiography;  Surgeon: Ezra GORMAN Shuck, MD;  Location: [facility] INVASIVE CV LAB;  Service: Cardiovascular;  Laterality: N/A;   CATARACT EXTRACTION Bilateral OD: 03/07/19, OS: 02/28/19   Dr. Roz   CATARACT EXTRACTION, BILATERAL     COLONOSCOPY  normal exam in [place], [place] abour 10-12 years ago   EYE SURGERY Bilateral OD: 03/07/19, OS: 02/28/19   Cat Sx - Dr. Roz   HYSTEROSCOPY WITH D & C N/A 08/01/2018   Procedure: DILATATION AND CURETTAGE /HYSTEROSCOPY;  Surgeon: Alger Gong, MD;  Location: [facility];  Service: Gynecology;  Laterality: N/A;   ROBOTIC ASSISTED TOTAL HYSTERECTOMY WITH BILATERAL SALPINGO OOPHERECTOMY N/A 11/28/2018   Procedure: XI ROBOTIC ASSISTED TOTAL HYSTERECTOMY WITH BILATERAL SALPINGO OOPHORECTOMY;  Surgeon: Eloy Herring, MD;  Location: [facility] ORS;  Service: Gynecology;  Laterality: N/A;   SENTINEL NODE BIOPSY N/A 11/28/2018   Procedure: SENTINEL NODE  BIOPSY;  Surgeon: Eloy Herring, MD;  Location: [facility];  Service: Gynecology;  Laterality: N/A;   UMBILICAL HERNIA REPAIR  child   Social History:  reports that she has never smoked. She has never used smokeless tobacco. She reports that she does not currently use alcohol . She reports that she does not currently use drugs.  Allergies[1]  Family History  Problem Relation Age of Onset   Heart disease Mother    Hypertension Mother    Diabetes Mother    Cancer Father        hodgkins lymphoma   Heart disease Sister        heart attack   Diabetes Sister    Colon cancer Brother 1   Diabetes Maternal Aunt    Diabetes Maternal Uncle    Cancer Other        parent   Diabetes Other        parent   Heart disease Other        parent   Hyperlipidemia Other        parent   Hypertension Other        parent   Arthritis Other        parent   Breast cancer Neg Hx    Colon polyps Neg Hx    Esophageal cancer Neg Hx    Rectal cancer Neg Hx    Stomach cancer Neg Hx     Prior to Admission medications  Medication Sig Start Date End Date Taking? Authorizing Provider  Accu-Chek Softclix Lancets lancets Use to check blood sugar 3 times daily. 07/14/21   [name], MD  acetaminophen  (TYLENOL ) 500 MG tablet Take 2 tablets (1,000 mg total) by mouth every 6 (six) hours as needed. 01/23/24 01/22/25  Ulis Bottcher, PA-C  amLODipine  (NORVASC ) 10 MG tablet Take 1 tablet (10 mg total) by mouth daily. 12/06/23   [name], MD  aspirin  EC 81 MG tablet Take 1 tablet (81 mg total) daily by mouth. 04/18/17   [name], MD  atorvastatin  (LIPITOR) 80 MG tablet Take 1 tablet (80 mg total) by mouth once daily. (PLEASE SCHEDULE AN APPT FOR FUTURE REFILLS) 12/06/23   [name], MD  Blood Glucose Monitoring Suppl (ACCU-CHEK GUIDE) w/Device KIT 1 kit by Does not apply route in the morning, at noon, and at bedtime. Use to check blood sugar 3 times daily. 08/27/19   [name], MD   brimonidine  (ALPHAGAN ) 0.15 % ophthalmic solution Place 1 drop into both eyes 2 (two) times daily. 01/14/23   Valdemar Rogue, MD  brimonidine  (ALPHAGAN ) 0.2 % ophthalmic solution Place 1 drop into both eyes 2 (two) times daily. 05/12/23   Valdemar Rogue, MD  carvedilol  (COREG ) 25 MG tablet Take 1 tablet (25 mg total) by mouth 2 (two) times daily with a meal. 05/10/24  [name], MD  ciprofloxacin  (CIPRO ) 500 MG tablet Take 1 tablet (500 mg total) by mouth 2 (two) times daily for 7 days. 05/07/24 05/15/24  [name], MD  Continuous Blood Gluc Receiver (FREESTYLE LIBRE 2 READER) DEVI Use as directed 02/08/22   [name], MD  Continuous Blood Gluc Sensor (FREESTYLE LIBRE SENSOR SYSTEM) MISC Change sensor every 2 weeks 02/08/22   [name], MD  diclofenac  Sodium (VOLTAREN ) 1 % GEL Apply 2 g topically 2 (two) times daily as needed. 02/08/22   [name], MD  dorzolamide -timolol  (COSOPT ) 2-0.5 % ophthalmic solution Place 1 drop into both eyes 2 (two) times daily. 06/26/23   Valdemar Rogue, MD  ezetimibe  (ZETIA ) 10 MG tablet Take 1 tablet (10 mg total) by mouth daily. PATIENT MUST CALL AND SCHEDULE OVERDUE ANNUAL APPOINTMENT FOR FURTHER REFILLS.FIRST ATTEMPT 02/08/24 11/04/24  Vannie Reche RAMAN, NP  glimepiride  (AMARYL ) 2 MG tablet Take 1 tablet (2 mg total) by mouth daily before breakfast. 04/18/24   [name], MD  glucose blood (ACCU-CHEK GUIDE) test strip Use to check blood sugar 3 times daily. 02/08/22   [name], MD  insulin  glargine (LANTUS  SOLOSTAR) 100 UNIT/ML Solostar Pen Inject 64 Units into the skin daily. 05/10/24   [name], MD  insulin  lispro (HUMALOG  KWIKPEN) 100 UNIT/ML KwikPen Inject 34 Units into the skin in the morning and at bedtime. 05/10/24   [name], MD  Insulin  Pen Needle (TECHLITE PEN NEEDLES) 32G X 4 MM MISC Use as directed. 02/24/24   [name], MD  isosorbide  mononitrate (IMDUR ) 60 MG 24 hr tablet  Take 1 tablet (60 mg total) by mouth daily. 06/27/23   Lonni Slain, MD  ketorolac  (ACULAR ) 0.5 % ophthalmic solution Place 1 drop into both eyes 4 (four) times daily. 05/12/23   Valdemar Rogue, MD  nitroGLYCERIN  (NITROSTAT ) 0.4 MG SL tablet Place 1 tab under tongue for chest pain.  May repeat after 5 minutes x 2.  DO NOT TAKE MORE THAN 3 TABS DURING AN EPISODE OF CHEST PAIN.Pt must schedule an overdue followup appt with Cardiology for any more refills. [phone] 1st attempt Thank You 03/01/24   Lonni Slain, MD  omeprazole  (PRILOSEC) 20 MG capsule Take 1 capsule (20 mg total) by mouth daily. 04/23/24   [name], MD  potassium chloride  (KLOR-CON ) 10 MEQ tablet Take 2 tablets (20 mEq total) by mouth daily. 12/06/23   [name], MD  prednisoLONE  acetate (PRED FORTE ) 1 % ophthalmic suspension Place 1 drop into both eyes daily. 04/13/22   Valdemar Rogue, MD  Semaglutide ,0.25 or 0.5MG /DOS, (OZEMPIC , 0.25 OR 0.5 MG/DOSE,) 2 MG/3ML SOPN Inject 0.25 mg into the skin once a week. 05/10/24   [name], MD  tiZANidine  (ZANAFLEX ) 4 MG tablet Take 1 tablet (4 mg total) by mouth every 8 (eight) hours as needed for muscle spasms. 01/23/24   Ulis Bottcher, PA-C    Physical Exam: Vitals:   05/10/24 1756 05/10/24 1845  BP: 129/70 133/65  Pulse: 75 72  Resp: 18 17  Temp: 98.4 F (36.9 C)   TempSrc: Oral   SpO2: 99% 99%  Weight: (!) 147.9 kg   Height: 5' 4 (1.626 m)    Constitutional: Morbidly Obese, acutely ill looking, NAD, calm, comfortable Eyes: PERRL, lids and conjunctivae normal ENMT: Mucous membranes are moist. Posterior pharynx clear of any exudate or lesions.Normal dentition.  Neck: normal, supple, no masses, no thyromegaly Respiratory: clear to auscultation  bilaterally, no wheezing, no crackles. Normal respiratory effort. No accessory muscle use.  Cardiovascular: Regular rate and rhythm, no murmurs / rubs / gallops. No extremity edema. 2+ pedal pulses.  No carotid bruits.  Abdomen: no tenderness, no masses palpated. No hepatosplenomegaly. Bowel sounds positive.  Musculoskeletal: Good range of motion, no joint swelling or tenderness, Skin: no rashes, lesions, ulcers. No induration Neurologic: CN 2-12 grossly intact. Sensation intact, DTR normal. Strength 5/5 in all 4.  Psychiatric: Normal judgment and insight. Alert and oriented x 3. Normal mood  Data Reviewed:  Temperature 98.4, blood pressure 120/72, white count 10.5 hemoglobin 11.8 and creatinine 1.44. Troponin 20 Emerge balloon 20 EKG shows sinus rhythm CT angiogram of the chest shows negative for acute PE cardiomegaly with small pericardial effusion head CT without contrast is negative  Assessment and Plan:  #1 syncope versus seizure: Patient had episode that appears to be syncopal episode but could also be seizure.  Patient will be admitted for observation neurology consulted.  With multiple episodes is more likely to be a seizure but will rule out cardiac as well as neurologic at this point.  MRI of the brain with and without ordered.  Echocardiogram ordered.  Admit to telemetry bed.  Discussed with neurology.  EEG may be done in the future but no right now.  #2 essential hypertension: Continue blood pressure medications  #3 diabetes: Insulin -dependent.  Continue sliding scale insulin   #4 chronic kidney disease stage IIIb: Stable continue monitoring.  #5 hyperlipidemia: Continue statin  #6 coronary artery disease: Stable.  Continue monitoring.  #7 severe morbid obesity: Dietary counseling  #8 history of endometrial cancer: In remission.  Continue to monitor    Advance Care Planning:   Code Status: Full Code   Consults: Neurologist Dr. Merrianne  Family Communication: No family  Severity of Illness: The appropriate patient status for this patient is OBSERVATION. Observation status is judged to be reasonable and necessary in order to provide the required intensity of service  to ensure the patient's safety. The patient's presenting symptoms, physical exam findings, and initial radiographic and laboratory data in the context of their medical condition is felt to place them at decreased risk for further clinical deterioration. Furthermore, it is anticipated that the patient will be medically stable for discharge from the hospital within 2 midnights of admission.   AuthorBETHA SIM KNOLL, MD 05/10/2024 9:29 PM  For on call review [email or web address].      [1]  Allergies Allergen Reactions   Jardiance  [Empagliflozin ] Other (See Comments)    Frequent yeast infection   Metformin  And Related Diarrhea

## 2024-05-10 NOTE — ED Triage Notes (Addendum)
 Patient BIB [facility] from a doctors office. Patient started having syncopal episodes 3 months ago. Within the last 3 weeks she has had 3 episodes. Patient was at her PCP for an appointment. Patient felt heaviness in her shoulders and then next thing she realized she was on the ground. Her doctor said she was down for 5 minutes. Her doctor described it as a post ictal state after the episode. Patient was glaring and not making sense. No history of seizures. Patient complained of neck and bilateral shoulder pain and heaviness in her chest. Has a dry cough.  EMS 20G left hand 500mL normal saline

## 2024-05-10 NOTE — ED Provider Notes (Signed)
 [place] EMERGENCY DEPARTMENT AT [facility] Provider Note   CSN: [id number] Arrival date & time: 05/10/24  1739     Patient presents with: Loss of Consciousness   Kimberly Frazier is a 63 y.o. female with history of hypertension, hyperlipidemia, diabetes, CAD presents following syncopal episode.  Patient reportedly has had several similar episodes over the past 2 years.  Typically several months in between episodes.  Her last episode was 3 weeks ago.  Today she had a recurrence while at a PCP office.  It was witnessed.  Denied any prodrome.  Was reportedly postictal x several minutes.  Has no history of seizures.  Had no urinary incontinence or tongue lacerations today.  Without any chest pain, shortness of breath, significant headache or any other concerning neurological symptoms.  No history of PE.    Loss of Consciousness     Past Medical History:  Diagnosis Date   Adrenal adenoma, left    Arthritis    Cancer (HCC)    CKD (chronic kidney disease), stage II    Coronary artery disease 07-28-2018 followed [facility])  currently due to no insurance   per cardiac cath 02-06-2015 (positive mild lateral ishcemia on stress test)--- dLAD 80%,  mLAD 40%,  mPDA 80% (small vessel),  ostial D1 70%,  CFx with lumial irregarlities-- medical management   Depression    Diabetic neuropathy (HCC)    Diabetic retinopathy (HCC)    NPDR OU   Headache    History of Bell's palsy 07/2011   per pt residual facial pain on left side occasionally   History of cancer of vagina 1999   per pt completed radiation and chemo   History of sepsis 12/30/2017   positive blood culter fro E.coli   Hyperlipidemia    Hypertension    Hypertensive retinopathy    OU   Hypokalemia    Insulin  dependent type 2 diabetes mellitus, uncontrolled    followed by pcp---  A1c was 11.7 on 06-15-2018 in epic   Myocardial infarction [facility])    10 yrs ago?   Nocturia    Peripheral neuropathy     PMB (postmenopausal bleeding)    Wears dentures    upper   Wears glasses    Past Surgical History:  Procedure Laterality Date   BREAST BIOPSY  2012   benign   CARDIAC CATHETERIZATION N/A 02/06/2015   Procedure: Left Heart Cath and Coronary Angiography;  Surgeon: Ezra GORMAN Shuck, MD;  Location: [facility] INVASIVE CV LAB;  Service: Cardiovascular;  Laterality: N/A;   CATARACT EXTRACTION Bilateral OD: 03/07/19, OS: 02/28/19   Dr. Roz   CATARACT EXTRACTION, BILATERAL     COLONOSCOPY     normal exam in [place], [place] abour 10-12 years ago   EYE SURGERY Bilateral OD: 03/07/19, OS: 02/28/19   Cat Sx - Dr. Roz   HYSTEROSCOPY WITH D & C N/A 08/01/2018   Procedure: DILATATION AND CURETTAGE /HYSTEROSCOPY;  Surgeon: Alger Gong, MD;  Location: [facility];  Service: Gynecology;  Laterality: N/A;   ROBOTIC ASSISTED TOTAL HYSTERECTOMY WITH BILATERAL SALPINGO OOPHERECTOMY N/A 11/28/2018   Procedure: XI ROBOTIC ASSISTED TOTAL HYSTERECTOMY WITH BILATERAL SALPINGO OOPHORECTOMY;  Surgeon: Eloy Herring, MD;  Location: [facility] ORS;  Service: Gynecology;  Laterality: N/A;   SENTINEL NODE BIOPSY N/A 11/28/2018   Procedure: SENTINEL NODE BIOPSY;  Surgeon: Eloy Herring, MD;  Location: [facility] OR[facility];  Service: Gynecology;  Laterality: N/A;   UMBILICAL HERNIA REPAIR  child  Prior to Admission medications  Medication Sig Start Date End Date Taking? Authorizing Provider  Accu-Chek Softclix Lancets lancets Use to check blood sugar 3 times daily. 07/14/21   [name], MD  acetaminophen  (TYLENOL ) 500 MG tablet Take 2 tablets (1,000 mg total) by mouth every 6 (six) hours as needed. 01/23/24 01/22/25  Ulis Bottcher, PA-C  amLODipine  (NORVASC ) 10 MG tablet Take 1 tablet (10 mg total) by mouth daily. 12/06/23   [name], MD  aspirin  EC 81 MG tablet Take 1 tablet (81 mg total) daily by mouth. 04/18/17   [name], MD  atorvastatin  (LIPITOR) 80 MG tablet Take 1 tablet (80 mg total) by mouth  once daily. (PLEASE SCHEDULE AN APPT FOR FUTURE REFILLS) 12/06/23   [name], MD  Blood Glucose Monitoring Suppl (ACCU-CHEK GUIDE) w/Device KIT 1 kit by Does not apply route in the morning, at noon, and at bedtime. Use to check blood sugar 3 times daily. 08/27/19   [name], MD  brimonidine  (ALPHAGAN ) 0.15 % ophthalmic solution Place 1 drop into both eyes 2 (two) times daily. 01/14/23   Valdemar Rogue, MD  brimonidine  (ALPHAGAN ) 0.2 % ophthalmic solution Place 1 drop into both eyes 2 (two) times daily. 05/12/23   Valdemar Rogue, MD  carvedilol  (COREG ) 25 MG tablet Take 1 tablet (25 mg total) by mouth 2 (two) times daily with a meal. 05/10/24   [name], MD  ciprofloxacin  (CIPRO ) 500 MG tablet Take 1 tablet (500 mg total) by mouth 2 (two) times daily for 7 days. 05/07/24 05/15/24  [name], MD  Continuous Blood Gluc Receiver (FREESTYLE LIBRE 2 READER) DEVI Use as directed 02/08/22   [name], MD  Continuous Blood Gluc Sensor (FREESTYLE LIBRE SENSOR SYSTEM) MISC Change sensor every 2 weeks 02/08/22   [name], MD  diclofenac  Sodium (VOLTAREN ) 1 % GEL Apply 2 g topically 2 (two) times daily as needed. 02/08/22   [name], MD  dorzolamide -timolol  (COSOPT ) 2-0.5 % ophthalmic solution Place 1 drop into both eyes 2 (two) times daily. 06/26/23   Valdemar Rogue, MD  ezetimibe  (ZETIA ) 10 MG tablet Take 1 tablet (10 mg total) by mouth daily. PATIENT MUST CALL AND SCHEDULE OVERDUE ANNUAL APPOINTMENT FOR FURTHER REFILLS.FIRST ATTEMPT 02/08/24 11/04/24  Vannie Reche RAMAN, NP  glimepiride  (AMARYL ) 2 MG tablet Take 1 tablet (2 mg total) by mouth daily before breakfast. 04/18/24   [name], MD  glucose blood (ACCU-CHEK GUIDE) test strip Use to check blood sugar 3 times daily. 02/08/22   [name], MD  insulin  glargine (LANTUS  SOLOSTAR) 100 UNIT/ML Solostar Pen Inject 64 Units into the skin daily. 05/10/24   [name], MD  insulin  lispro  (HUMALOG  KWIKPEN) 100 UNIT/ML KwikPen Inject 34 Units into the skin in the morning and at bedtime. 05/10/24   [name], MD  Insulin  Pen Needle (TECHLITE PEN NEEDLES) 32G X 4 MM MISC Use as directed. 02/24/24   [name], MD  isosorbide  mononitrate (IMDUR ) 60 MG 24 hr tablet Take 1 tablet (60 mg total) by mouth daily. 06/27/23   Lonni Slain, MD  ketorolac  (ACULAR ) 0.5 % ophthalmic solution Place 1 drop into both eyes 4 (four) times daily. 05/12/23   Valdemar Rogue, MD  nitroGLYCERIN  (NITROSTAT ) 0.4 MG SL tablet Place 1 tab under tongue for chest pain.  May repeat after 5 minutes x 2.  DO NOT TAKE MORE THAN 3 TABS DURING AN EPISODE OF  CHEST PAIN.Pt must schedule an overdue followup appt with Cardiology for any more refills. [phone] 1st attempt Thank You 03/01/24   Lonni Slain, MD  omeprazole  (PRILOSEC) 20 MG capsule Take 1 capsule (20 mg total) by mouth daily. 04/23/24   [name], MD  potassium chloride  (KLOR-CON ) 10 MEQ tablet Take 2 tablets (20 mEq total) by mouth daily. 12/06/23   [name], MD  prednisoLONE  acetate (PRED FORTE ) 1 % ophthalmic suspension Place 1 drop into both eyes daily. 04/13/22   Valdemar Rogue, MD  Semaglutide ,0.25 or 0.5MG /DOS, (OZEMPIC , 0.25 OR 0.5 MG/DOSE,) 2 MG/3ML SOPN Inject 0.25 mg into the skin once a week. 05/10/24   [name], MD  tiZANidine  (ZANAFLEX ) 4 MG tablet Take 1 tablet (4 mg total) by mouth every 8 (eight) hours as needed for muscle spasms. 01/23/24   Ulis Bottcher, PA-C    Allergies: Jardiance  [empagliflozin ] and Metformin  and related    Review of Systems  Cardiovascular:  Positive for syncope.    Updated Vital Signs BP 133/65   Pulse 72   Temp 98.4 F (36.9 C) (Oral)   Resp 17   Ht 5' 4 (1.626 m)   Wt (!) 147.9 kg   SpO2 99%   BMI 55.96 kg/m   Physical Exam Vitals and nursing note reviewed.  Constitutional:      General: She is not in acute distress.    Appearance: She is  well-developed.  HENT:     Head: Normocephalic and atraumatic.  Eyes:     Conjunctiva/sclera: Conjunctivae normal.  Cardiovascular:     Rate and Rhythm: Normal rate and regular rhythm.     Heart sounds: No murmur heard. Pulmonary:     Effort: Pulmonary effort is normal. No respiratory distress.     Breath sounds: Normal breath sounds.  Abdominal:     Palpations: Abdomen is soft.     Tenderness: There is no abdominal tenderness.  Musculoskeletal:        General: No swelling.     Cervical back: Neck supple.  Skin:    General: Skin is warm and dry.     Capillary Refill: Capillary refill takes less than 2 seconds.  Neurological:     Mental Status: She is alert.     Comments: Patient is alert and oriented. There is no abnormal phonation. Symmetric smile without facial droop. . Moves all extremities spontaneously. 5/5 strength in upper and lower extremities. . No sensation deficit. There is no nystagmus. EOMI, PERRL. Coordination intact with finger to nose   Psychiatric:        Mood and Affect: Mood normal.     (all labs ordered are listed, but only abnormal results are displayed) Labs Reviewed  COMPREHENSIVE METABOLIC PANEL WITH GFR - Abnormal; Notable for the following components:      Result Value   Creatinine, Ser 1.44 (*)    GFR, Estimated 40 (*)    All other components within normal limits  CBC - Abnormal; Notable for the following components:   Hemoglobin 11.8 (*)    All other components within normal limits  CBG MONITORING, ED - Abnormal; Notable for the following components:   Glucose-Capillary 142 (*)    All other components within normal limits  TROPONIN T, HIGH SENSITIVITY - Abnormal; Notable for the following components:   Troponin T High Sensitivity 20 (*)    All other components within normal limits  URINALYSIS, ROUTINE W REFLEX MICROSCOPIC  PRO BRAIN NATRIURETIC PEPTIDE  CBG MONITORING,  ED  TROPONIN T, HIGH SENSITIVITY    EKG: EKG  Interpretation Date/Time:  Thursday May 10 2024 17:55:21 EST Ventricular Rate:  78 PR Interval:  153 QRS Duration:  93 QT Interval:  391 QTC Calculation: 449 R Axis:   -11  Text Interpretation: Sinus rhythm Borderline repolarization abnormality No significant change since last tracing Confirmed by Patt Alm DEL [phone]) on 05/10/2024 6:37:48 PM  Radiology: CT Angio Chest PE W and/or Wo Contrast Result Date: 05/10/2024 CLINICAL DATA:  Multiple syncope EXAM: CT ANGIOGRAPHY CHEST WITH CONTRAST TECHNIQUE: Multidetector CT imaging of the chest was performed using the standard protocol during bolus administration of intravenous contrast. Multiplanar CT image reconstructions and MIPs were obtained to evaluate the vascular anatomy. RADIATION DOSE REDUCTION: This exam was performed according to the departmental dose-optimization program which includes automated exposure control, adjustment of the mA and/or kV according to patient size and/or use of iterative reconstruction technique. CONTRAST:  60mL OMNIPAQUE  IOHEXOL  350 MG/ML SOLN COMPARISON:  CT 08/17/2018 FINDINGS: Cardiovascular: Slightly limited by habitus. No acute pulmonary embolus to the segmental level. Mild atherosclerosis. No aneurysm. Cardiomegaly with small pericardial effusion. Coronary vascular calcification. Left ventricular apical wall thickening as was seen on prior exams consistent with aneurysm and probable prior infarct. Mediastinum/Nodes: Patent trachea. No suspicious thyroid  nodules. No suspicious lymph nodes. Esophagus is within normal limits Lungs/Pleura: No acute airspace disease, pleural effusion, or pneumothorax Upper Abdomen: No acute finding. Stable 2 cm left adrenal nodule consistent with adenoma, no imaging follow-up is recommended. Possible small gallstones. Musculoskeletal: No acute osseous abnormality. Review of the MIP images confirms the above findings. IMPRESSION: 1. Negative for acute pulmonary embolus. 2. Cardiomegaly  with small pericardial effusion. 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 Electronically Signed   By: Luke Bun M.D.   On: 05/10/2024 21:05   CT HEAD WO CONTRAST ( ) Result Date: 05/10/2024 CLINICAL DATA:  New onset seizure syncope EXAM: CT HEAD WITHOUT CONTRAST TECHNIQUE: Contiguous axial images were obtained from the base of the skull through the vertex without intravenous contrast. RADIATION DOSE REDUCTION: This exam was performed according to the departmental dose-optimization program which includes automated exposure control, adjustment of the mA and/or kV according to patient size and/or use of iterative reconstruction technique. COMPARISON:  MRI 11/29/2019, CT 11/26/2019 FINDINGS: Brain: No acute territorial infarction, hemorrhage or intracranial mass. The ventricles are nonenlarged Vascular: No hyperdense vessel or unexpected calcification. Skull: Normal. Negative for fracture or focal lesion. Sinuses/Orbits: Old appearing fracture deformity medial wall right orbit Other: None IMPRESSION: Negative non contrasted CT appearance of the brain. Electronically Signed   By: Luke Bun M.D.   On: 05/10/2024 20:56     Procedures   Medications Ordered in the ED  levETIRAcetam (KEPPRA) undiluted injection 1,000 mg (1,000 mg Intravenous Given 05/10/24 1934)  iohexol  (OMNIPAQUE ) 350 MG/ML injection 60 mL (60 mLs Intravenous Contrast Given 05/10/24 2041)    Clinical Course as of 05/10/24 2130  Thu May 10, 2024  1853 Patient evaluated following syncope versus seizure that occurred today.  With multiple episodes over the past 2 years.  Upon arrival patient is hemodynamically stable.  She is alert and oriented.  GCS 15.  She is without any complaints currently.  Her exam is entirely benign.  Will obtain routine labs, CT head and CT angio PE study. [JT]  2017 Troponin T, High Sensitivity(!) Mildly elevated 20 will trend [JT]  2017  CBC(!) No leukocytosis, hemoglobin stable [JT]  2117 CT  HEAD WO CONTRAST ( ) Negative [JT]  2117 Negative for acute PE, cardiomegaly with small pericardial effusion stable left ventricular apex aneurysm [JT]  2120 Discussed patient with Dr. Lindzen.  Recommended MRI with and without contrast, continuing Keppra twice daily.  EEG not necessary as patient is at bed side.  Admission would be safest for further evaluation [JT]  2123 Discussed patient with Dr. Sim, agreed for admission [JT]    Clinical Course User Index [JT] Donnajean Lynwood DEL, PA-C                                 Medical Decision Making Amount and/or Complexity of Data Reviewed Labs: ordered. Decision-making details documented in ED Course. Radiology: ordered.  Risk Prescription drug management.   This patient presents to the ED with chief complaint(s) of syncope .  The complaint involves an extensive differential diagnosis and also carries with it a high risk of complications and morbidity.   Pertinent past medical history as listed in HPI  The differential diagnosis includes  Syncope, seizure, vasovagal, arrhythmia, ACS Additional history obtained: Additional history obtained from family Records reviewed Care Everywhere/External Records  Disposition:   Patient will be admitted for further workup and management.  Social Determinants of Health:   none  This note was dictated with voice recognition software.  Despite best efforts at proofreading, errors may have occurred which can change the documentation meaning.       Final diagnoses:  Syncope, unspecified syncope type    ED Discharge Orders     None          Hillery Bhalla H, PA-C 05/10/24 2130    Patt Alm Macho, MD 05/10/24 [phone]

## 2024-05-11 ENCOUNTER — Other Ambulatory Visit: Payer: Self-pay

## 2024-05-11 ENCOUNTER — Inpatient Hospital Stay (HOSPITAL_COMMUNITY)

## 2024-05-11 ENCOUNTER — Other Ambulatory Visit (HOSPITAL_COMMUNITY): Payer: Self-pay

## 2024-05-11 LAB — TSH: TSH: 1.04 u[IU]/mL (ref 0.350–4.500)

## 2024-05-11 LAB — HIV ANTIBODY (ROUTINE TESTING W REFLEX): HIV Screen 4th Generation wRfx: NONREACTIVE

## 2024-05-11 LAB — ECHOCARDIOGRAM COMPLETE
Area-P 1/2: 2.27 cm2
Est EF: 50
Height: 64 in
S' Lateral: 2.2 cm
Single Plane A4C EF: 47.1 %
Weight: 5216 [oz_av]

## 2024-05-11 LAB — MAGNESIUM: Magnesium: 2.3 mg/dL (ref 1.7–2.4)

## 2024-05-11 LAB — RENAL FUNCTION PANEL
Albumin: 3.6 g/dL (ref 3.5–5.0)
Anion gap: 8 (ref 5–15)
BUN: 20 mg/dL (ref 8–23)
CO2: 28 mmol/L (ref 22–32)
Calcium: 9.3 mg/dL (ref 8.9–10.3)
Chloride: 102 mmol/L (ref 98–111)
Creatinine, Ser: 1.17 mg/dL — ABNORMAL HIGH (ref 0.44–1.00)
GFR, Estimated: 52 mL/min — ABNORMAL LOW (ref >60)
Glucose, Bld: 216 mg/dL — ABNORMAL HIGH (ref 70–99)
Phosphorus: 3.9 mg/dL (ref 2.5–4.6)
Potassium: 3.5 mmol/L (ref 3.5–5.1)
Sodium: 138 mmol/L (ref 135–145)

## 2024-05-11 LAB — CBC
HCT: 36.7 % (ref 36.0–46.0)
Hemoglobin: 12.1 g/dL (ref 12.0–15.0)
MCH: 29.3 pg (ref 26.0–34.0)
MCHC: 33 g/dL (ref 30.0–36.0)
MCV: 88.9 fL (ref 80.0–100.0)
Platelets: 229 K/uL (ref 150–400)
RBC: 4.13 MIL/uL (ref 3.87–5.11)
RDW: 15.4 % (ref 11.5–15.5)
WBC: 10.3 K/uL (ref 4.0–10.5)
nRBC: 0 % (ref 0.0–0.2)

## 2024-05-11 LAB — HEMOGLOBIN A1C
Hgb A1c MFr Bld: 10.1 % — ABNORMAL HIGH (ref 4.8–5.6)
Mean Plasma Glucose: 243.17 mg/dL

## 2024-05-11 LAB — CREATININE, SERUM
Creatinine, Ser: 1.15 mg/dL — ABNORMAL HIGH (ref 0.44–1.00)
GFR, Estimated: 53 mL/min — ABNORMAL LOW (ref >60)

## 2024-05-11 LAB — GLUCOSE, CAPILLARY
Glucose-Capillary: 121 mg/dL — ABNORMAL HIGH (ref 70–99)
Glucose-Capillary: 114 mg/dL — ABNORMAL HIGH (ref 70–99)
Glucose-Capillary: 187 mg/dL — ABNORMAL HIGH (ref 70–99)
Glucose-Capillary: 194 mg/dL — ABNORMAL HIGH (ref 70–99)
Glucose-Capillary: 295 mg/dL — ABNORMAL HIGH (ref 70–99)

## 2024-05-11 LAB — URINE CULTURE

## 2024-05-11 MED ORDER — INSULIN GLARGINE-YFGN 100 UNIT/ML ~~LOC~~ SOLN
60.0000 [IU] | Freq: Every day | SUBCUTANEOUS | Status: DC
  Administered 2024-05-11 – 2024-05-13 (×3): 60 [IU] via SUBCUTANEOUS
  Filled 2024-05-11 (×3): qty 0.6

## 2024-05-11 MED ORDER — NITROGLYCERIN 0.4 MG SL SUBL: 0.4000 mg | SUBLINGUAL_TABLET | SUBLINGUAL | Status: DC | PRN

## 2024-05-11 MED ORDER — ISOSORBIDE MONONITRATE ER 60 MG PO TB24
60.0000 mg | ORAL_TABLET | Freq: Every day | ORAL | Status: DC
  Administered 2024-05-11 – 2024-05-13 (×3): 60 mg via ORAL
  Filled 2024-05-11 (×3): qty 1

## 2024-05-11 MED ORDER — ATORVASTATIN CALCIUM 20 MG PO TABS
80.0000 mg | ORAL_TABLET | Freq: Every day | ORAL | Status: DC
  Administered 2024-05-11 – 2024-05-13 (×3): 80 mg via ORAL
  Filled 2024-05-11 (×4): qty 4
  Filled 2024-05-11: qty 8

## 2024-05-11 MED ORDER — CARVEDILOL 25 MG PO TABS
25.0000 mg | ORAL_TABLET | Freq: Two times a day (BID) | ORAL | Status: DC
  Administered 2024-05-11 – 2024-05-13 (×5): 25 mg via ORAL
  Filled 2024-05-11 (×5): qty 1

## 2024-05-11 MED ORDER — AMLODIPINE BESYLATE 10 MG PO TABS
10.0000 mg | ORAL_TABLET | Freq: Every day | ORAL | Status: DC
  Administered 2024-05-11 – 2024-05-13 (×3): 10 mg via ORAL
  Filled 2024-05-11 (×3): qty 1

## 2024-05-11 MED ORDER — EZETIMIBE 10 MG PO TABS
10.0000 mg | ORAL_TABLET | Freq: Every day | ORAL | Status: DC
  Administered 2024-05-11 – 2024-05-13 (×3): 10 mg via ORAL
  Filled 2024-05-11 (×3): qty 1

## 2024-05-11 MED ORDER — ASPIRIN 81 MG PO TBEC
81.0000 mg | DELAYED_RELEASE_TABLET | Freq: Every day | ORAL | Status: DC
  Administered 2024-05-11 – 2024-05-13 (×3): 81 mg via ORAL
  Filled 2024-05-11 (×3): qty 1

## 2024-05-11 MED ORDER — PERFLUTREN LIPID MICROSPHERE
1.0000 mL | INTRAVENOUS | Status: AC | PRN
  Administered 2024-05-11: 2 mL via INTRAVENOUS

## 2024-05-11 MED ADMIN — Acetaminophen Tab 500 MG: 1000 mg | ORAL | NDC 00904673061

## 2024-05-11 MED FILL — Acetaminophen Tab 500 MG: 1000.0000 mg | ORAL | Qty: 2 | Status: AC

## 2024-05-11 MED FILL — Tizanidine HCl Tab 4 MG (Base Equivalent): 4.0000 mg | ORAL | Qty: 1 | Status: CN

## 2024-05-11 NOTE — Plan of Care (Signed)
  Problem: Activity: Goal: Risk for activity intolerance will decrease Outcome: Progressing   Problem: Activity: Goal: Risk for activity intolerance will decrease Outcome: Progressing   

## 2024-05-11 NOTE — Progress Notes (Signed)
-   Telemetry monitoring 

## 2024-05-11 NOTE — Progress Notes (Signed)
 PROGRESS NOTE    Kimberly Frazier  FMW:[id number] DOB: 11-May-1961 DOA: 05/10/2024 PCP: [name], MD  Outpatient Specialists:     Brief Narrative:  Patient is a 63 year old female, morbidly obese, with multiple medical and cardiac history.  Patient carries diagnosis of chronic kidney disease stage IIIa, coronary artery disease, diabetes mellitus with neuropathy and retinopathy hyperlipidemia, hypertension, chronic hypokalemia, peripheral neuropathy and recurrent syncope.  05/11/2024: Patient was admitted with syncope and collapse.  Workup is in progress.  MRI of the brain has not revealed acute findings.  Echocardiogram report is pending.  Telemonitoring revealed torsade the pontis.  Cardiology team consulted.  Patient snores, but has never been worked up for OSA (according to the patient).   Assessment & Plan:   Principal Problem:   Syncope and collapse Active Problems:   Essential hypertension   Hyperlipidemia   Severe obesity (BMI >= 40) (HCC)   CAD (coronary artery disease)   Diabetic peripheral neuropathy (HCC)   Diabetes mellitus (HCC)   History of endometrial cancer   Stage 3b chronic kidney disease (HCC)   Syncope and collapse: - Complete workup. - Neurology input is appreciated. - Awaiting cardiology input. - MRI brain is not revealing. - Telemonitoring revealed torsades de pointes. - Patient has history of chronic hypokalemia. - Repeat renal panel and magnesium level. - Keep potassium above 4 and magnesium above 2. -Troponin is flat (20) - Continue telemonitoring. - Consider outpatient sleep studies.  Hypertension: -Blood pressure is reasonably controlled.  Diabetes mellitus: - Continue to optimize. -Continue subcutaneous Semglee  and sliding scale insulin  coverage.  Acute kidney injury on chronic kidney disease stage IIIa: - Serum creatinine has improved from 1.44-1.17.  - Continue to monitor renal function and electrolytes.  Hyperlipidemia: -  Continue Zetia  and atorvastatin .  Coronary artery disease: - Stable. - No chest pain. - Stable on flat troponin.  Morbid obesity: - Diet and exercise. - PCP to consider subcutaneous Ozempic  or Mounjaro i.e. if not contraindicated. - Consider outpatient sleep studies.  History of endometrial cancer:   DVT prophylaxis: Subcutaneous Lovenox . Code Status: Full code. Family Communication:  Disposition Plan: Likely discharge back home.   Consultants:  Neurology. Cardiology.  Procedures:  None.  Antimicrobials:  None.   Subjective: No new complaints.  Objective: Vitals:   05/11/24 0522 05/11/24 0905 05/11/24 0919 05/11/24 1401  BP: 121/68 (!) 152/92 128/77 117/75  Pulse: 71 73  72  Resp: 15 20  20   Temp: 97.7 F (36.5 C) 97.7 F (36.5 C)  97.8 F (36.6 C)  TempSrc: Oral Oral  Oral  SpO2: 97% 100%  100%  Weight:      Height:        Intake/Output Summary (Last 24 hours) at 05/11/2024 1558 Last data filed at 05/11/2024 1401 Gross per 24 hour  Intake 1080 ml  Output --  Net 1080 ml   Filed Weights   05/10/24 1756  Weight: (!) 147.9 kg    Examination:  General exam: Appears calm and comfortable.  Patient is morbidly obese. Respiratory system: Clear to auscultation. Respiratory effort normal. Cardiovascular system: S1 & S2 heard Gastrointestinal system: Abdomen is obese, soft and nontender.  Central nervous system: Alert and oriented.   Data Reviewed: I have personally reviewed following labs and imaging studies  CBC: Recent Labs  Lab 05/08/24 1224 05/10/24 1856 05/11/24 0420  WBC 10.3 10.5 10.3  HGB 13.3 11.8* 12.1  HCT 43.7 36.3 36.7  MCV 93 89.4 88.9  PLT 256 235 229  Basic Metabolic Panel: Recent Labs  Lab 05/08/24 1224 05/10/24 1856 05/11/24 0420  NA 137 139  --   K 3.8 3.5  --   CL 97 104  --   CO2 25 26  --   GLUCOSE 410* 79  --   BUN 12 22  --   CREATININE 1.28* 1.44* 1.15*  CALCIUM  9.3 9.5  --    GFR: Estimated  Creatinine Clearance: 70.8 mL/min (A) (by C-G formula based on SCr of 1.15 mg/dL (H)). Liver Function Tests: Recent Labs  Lab 05/08/24 1224 05/10/24 1856  AST 20 26  ALT 25 27  ALKPHOS 123 109  BILITOT 0.7 0.7  PROT 7.4 7.1  ALBUMIN 4.0 3.7   No results for input(s): LIPASE, AMYLASE in the last 168 hours. No results for input(s): AMMONIA in the last 168 hours. Coagulation Profile: No results for input(s): INR, PROTIME in the last 168 hours. Cardiac Enzymes: No results for input(s): CKTOTAL, CKMB, CKMBINDEX, TROPONINI in the last 168 hours. BNP (last 3 results) Recent Labs    05/10/24 2207  PROBNP 198.0   HbA1C: Recent Labs    05/10/24 1605 05/11/24 0420  HGBA1C 10.5* 10.1*   CBG: Recent Labs  Lab 05/10/24 2121 05/10/24 2307 05/11/24 0521 05/11/24 0735 05/11/24 1132  GLUCAP 86 149* 121* 114* 187*   Lipid Profile: No results for input(s): CHOL, HDL, LDLCALC, TRIG, CHOLHDL, LDLDIRECT in the last 72 hours. Thyroid  Function Tests: Recent Labs    05/11/24 0420  TSH 1.040   Anemia Panel: No results for input(s): VITAMINB12, FOLATE, FERRITIN, TIBC, IRON, RETICCTPCT in the last 72 hours. Urine analysis:    Component Value Date/Time   COLORURINE YELLOW 11/24/2018 1210   APPEARANCEUR Cloudy (A) 08/06/2022 1125   LABSPEC 1.012 11/24/2018 1210   PHURINE 6.0 11/24/2018 1210   GLUCOSEU Negative 08/06/2022 1125   HGBUR SMALL (A) 11/24/2018 1210   BILIRUBINUR negative 11/21/2023 1835   BILIRUBINUR Negative 08/06/2022 1125   KETONESUR negative 11/21/2023 1835   KETONESUR NEGATIVE 11/24/2018 1210   PROTEINUR trace (A) 11/21/2023 1835   PROTEINUR 1+ (A) 08/06/2022 1125   PROTEINUR NEGATIVE 11/24/2018 1210   UROBILINOGEN 0.2 11/21/2023 1835   NITRITE Negative 11/21/2023 1835   NITRITE Negative 08/06/2022 1125   NITRITE NEGATIVE 11/24/2018 1210   LEUKOCYTESUR Negative 11/21/2023 1835   LEUKOCYTESUR 2+ (A) 08/06/2022 1125    LEUKOCYTESUR NEGATIVE 11/24/2018 1210   Sepsis Labs: @LABRCNTIP (procalcitonin:4,lacticidven:4)  )No results found for this or any previous visit (from the past 240 hours).       Radiology Studies: MR BRAIN WO CONTRAST Result Date: 05/11/2024 EXAM: MRI BRAIN WITHOUT CONTRAST 05/11/2024 08:09:52 AM TECHNIQUE: Multiplanar multisequence MRI of the head/brain was performed without the administration of intravenous contrast. COMPARISON: CT head 05/10/2024. CLINICAL HISTORY: Syncope/presyncope, cerebrovascular cause suspected. FINDINGS: BRAIN AND VENTRICLES: No acute infarct. No intracranial hemorrhage. No mass. No midline shift. No hydrocephalus. Scattered areas of T2 and FLAIR hyperintensity in the periventricular and subcortical white matter favored to reflect mild chronic microvascular ischemic changes. Chronic microhemorrhages in the bilateral basal ganglia. 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 Of the mesial temporal lobes and hippocampi without signal abnormality internal architecture of the hippocampi may appear preserved. Fornices are symmetric. The sella is unremarkable. Normal flow voids. ORBITS: No acute abnormality. SINUSES AND MASTOIDS: Bilateral mastoid effusions. Mucous retention cysts in the maxillary sinuses. BONES AND SOFT TISSUES: Normal marrow signal. No acute soft tissue abnormality. IMPRESSION: 1. No acute intracranial abnormality. 2. 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 3. Mild chronic microvascular ischemic changes. 4. Chronic microhemorrhages in the bilateral basal ganglia. Electronically signed by: Donnice Mania MD 05/11/2024 08:24 AM EST RP Workstation: [id number]   CT Angio Chest PE W and/or Wo Contrast Result Date: 05/10/2024 CLINICAL DATA:  Multiple syncope EXAM: CT ANGIOGRAPHY CHEST WITH CONTRAST TECHNIQUE: Multidetector CT imaging of the chest was performed using the standard protocol during bolus administration of intravenous contrast.  Multiplanar CT image reconstructions and MIPs were obtained to evaluate the vascular anatomy. RADIATION DOSE REDUCTION: This exam was performed according to the departmental dose-optimization program which includes automated exposure control, adjustment of the mA and/or kV according to patient size and/or use of iterative reconstruction technique. CONTRAST:  60mL OMNIPAQUE  IOHEXOL  350 MG/ML SOLN COMPARISON:  CT 08/17/2018 FINDINGS: Cardiovascular: Slightly limited by habitus. No acute pulmonary embolus to the segmental level. Mild atherosclerosis. No aneurysm. Cardiomegaly with small pericardial effusion. Coronary vascular calcification. Left ventricular apical wall thickening as was seen on prior exams consistent with aneurysm and probable prior infarct. Mediastinum/Nodes: Patent trachea. No suspicious thyroid  nodules. No suspicious lymph nodes. Esophagus is within normal limits Lungs/Pleura: No acute airspace disease, pleural effusion, or pneumothorax Upper Abdomen: No acute finding. Stable 2 cm left adrenal nodule consistent with adenoma, no imaging follow-up is recommended. Possible small gallstones. Musculoskeletal: No acute osseous abnormality. Review of the MIP images confirms the above findings. IMPRESSION: 1. Negative for acute pulmonary embolus. 2. Cardiomegaly with small pericardial effusion. 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 Electronically Signed   By: Luke Bun M.D.   On: 05/10/2024 21:05   CT HEAD WO CONTRAST ( ) Result Date: 05/10/2024 CLINICAL DATA:  New onset seizure syncope EXAM: CT HEAD WITHOUT CONTRAST TECHNIQUE: Contiguous axial images were obtained from the base of the skull through the vertex without intravenous contrast. RADIATION DOSE REDUCTION: This exam was performed according to the departmental dose-optimization program which includes automated exposure control, adjustment of the mA and/or kV according to  patient size and/or use of iterative reconstruction technique. COMPARISON:  MRI 11/29/2019, CT 11/26/2019 FINDINGS: Brain: No acute territorial infarction, hemorrhage or intracranial mass. The ventricles are nonenlarged Vascular: No hyperdense vessel or unexpected calcification. Skull: Normal. Negative for fracture or focal lesion. Sinuses/Orbits: Old appearing fracture deformity medial wall right orbit Other: None IMPRESSION: Negative non contrasted CT appearance of the brain. Electronically Signed   By: Luke Bun M.D.   On: 05/10/2024 20:56        Scheduled Meds:  amLODipine   10 mg Oral Daily   aspirin  EC  81 mg Oral Daily   atorvastatin   80 mg Oral Daily   carvedilol   25 mg Oral BID WC   enoxaparin  (LOVENOX ) injection  40 mg Subcutaneous Q24H   ezetimibe   10 mg Oral Daily   insulin  aspart  0-20 Units Subcutaneous TID WC   insulin  aspart  0-5 Units Subcutaneous QHS   insulin  glargine-yfgn  60 Units Subcutaneous Daily   isosorbide  mononitrate  60 mg Oral Daily   sodium chloride  flush  3 mL Intravenous Q12H   Continuous Infusions:   LOS: 1 day    Time spent: 55 minutes    Leatrice Chapel, MD  [facility] Pager #: [phone] 7PM-7AM contact night coverage as above

## 2024-05-11 NOTE — Progress Notes (Signed)
°  Echocardiogram 2D Echocardiogram has been performed.  Tinnie FORBES Gosling RDCS 05/11/2024, 1:42 PM

## 2024-05-11 NOTE — Inpatient Diabetes Management (Signed)
 Inpatient Diabetes Program Recommendations  AACE/ADA: New Consensus Statement on Inpatient Glycemic Control (2015)  Target Ranges:  Prepandial:   less than 140 mg/dL      Peak postprandial:   less than 180 mg/dL (1-2 hours)      Critically ill patients:  140 - 180 mg/dL   Lab Results  Component Value Date   GLUCAP 114 (H) 05/11/2024   HGBA1C 10.1 (H) 05/11/2024    Review of Glycemic Control  Diabetes history: DM 2 Outpatient Diabetes medications: Humalog  34 units bid before meals, Lantus  64 units Daily, pt also reports glimepiride  in pill form but cannot remember the dose Current orders for Inpatient glycemic control:  Semglee  60 units Daily Novolog  0-20 units tid + hs  A1c:    10.5% on 05/10/2024  10.3% on 12/06/2023  Spoke with pt at bedside regarding A1c level and glucose control at home. Pt reports seeing Dr. Vicci for DM management with assistance with Herlene Fleeta Morris, PharmD. Pt reports not being able to see either providers consistently due to illness. Pt did mention her medication were titrated last visit and her A1c was elevated at that time as well. Pt reports checking glucose 1-2 times a day and her trends were seen to be in the 220-290 range. Discussed importance of glucose control and trying to be consistent with medications when she is not feeling well. Pt does not have questions at this time and will follow up with PCP.  Thanks,  Clotilda Bull RN, MSN, BC-ADM Inpatient Diabetes Coordinator Team Pager [phone] (8a-5p)

## 2024-05-11 NOTE — Addendum Note (Signed)
 Addended by: DELORES RUBY F on: 05/11/2024 11:06 AM   Modules accepted: Orders

## 2024-05-12 DIAGNOSIS — R55 Syncope and collapse: Secondary | ICD-10-CM | POA: Diagnosis not present

## 2024-05-12 LAB — URINALYSIS, COMPLETE (UACMP) WITH MICROSCOPIC
Bacteria, UA: NONE SEEN
Bilirubin Urine: NEGATIVE
Glucose, UA: 50 mg/dL — AB
Hgb urine dipstick: NEGATIVE
Ketones, ur: NEGATIVE mg/dL
Leukocytes,Ua: NEGATIVE
Nitrite: NEGATIVE
Protein, ur: NEGATIVE mg/dL
Specific Gravity, Urine: 1.008 (ref 1.005–1.030)
pH: 5 (ref 5.0–8.0)

## 2024-05-12 LAB — RENAL FUNCTION PANEL
Albumin: 3.7 g/dL (ref 3.5–5.0)
Anion gap: 10 (ref 5–15)
BUN: 16 mg/dL (ref 8–23)
CO2: 28 mmol/L (ref 22–32)
Calcium: 9.3 mg/dL (ref 8.9–10.3)
Chloride: 103 mmol/L (ref 98–111)
Creatinine, Ser: 1.07 mg/dL — ABNORMAL HIGH (ref 0.44–1.00)
GFR, Estimated: 57 mL/min — ABNORMAL LOW (ref >60)
Glucose, Bld: 205 mg/dL — ABNORMAL HIGH (ref 70–99)
Phosphorus: 3.3 mg/dL (ref 2.5–4.6)
Potassium: 3.6 mmol/L (ref 3.5–5.1)
Sodium: 141 mmol/L (ref 135–145)

## 2024-05-12 LAB — GLUCOSE, CAPILLARY
Glucose-Capillary: 226 mg/dL — ABNORMAL HIGH (ref 70–99)
Glucose-Capillary: 228 mg/dL — ABNORMAL HIGH (ref 70–99)
Glucose-Capillary: 225 mg/dL — ABNORMAL HIGH (ref 70–99)
Glucose-Capillary: 254 mg/dL — ABNORMAL HIGH (ref 70–99)
Glucose-Capillary: 233 mg/dL — ABNORMAL HIGH (ref 70–99)

## 2024-05-12 LAB — MAGNESIUM: Magnesium: 2.3 mg/dL (ref 1.7–2.4)

## 2024-05-12 MED ORDER — POTASSIUM CHLORIDE 20 MEQ PO PACK
40.0000 meq | PACK | Freq: Once | ORAL | Status: AC
  Administered 2024-05-12: 40 meq via ORAL
  Filled 2024-05-12: qty 2

## 2024-05-12 MED ORDER — CEPHALEXIN 500 MG PO CAPS
500.0000 mg | ORAL_CAPSULE | Freq: Four times a day (QID) | ORAL | Status: DC
  Administered 2024-05-12 – 2024-05-13 (×5): 500 mg via ORAL
  Filled 2024-05-12 (×5): qty 1

## 2024-05-12 NOTE — Progress Notes (Addendum)
 PROGRESS NOTE    Kimberly Frazier  FMW:[id number] DOB: 1961/05/03 DOA: 05/10/2024 PCP: [name], MD  Outpatient Specialists:     Brief Narrative:  Patient is a 63 year old female, morbidly obese, with multiple medical and cardiac history.  Patient carries diagnosis of chronic kidney disease stage IIIa, coronary artery disease, diabetes mellitus with neuropathy and retinopathy hyperlipidemia, hypertension, chronic hypokalemia, peripheral neuropathy and recurrent syncope.  05/11/2024: Patient was admitted with syncope and collapse.  Workup is in progress.  MRI of the brain has not revealed acute findings.  Echocardiogram report is pending.  Telemonitoring revealed torsade the pontis.  Cardiology team consulted.  Patient snores, but has never been worked up for OSA (according to the patient).  05/12/2024: Patient seen.  No new complaints.  Awaiting cardiology input.  Telemetry recordings reviewed, no torsades overnight.   Assessment & Plan:   Principal Problem:   Syncope and collapse Active Problems:   Essential hypertension   Hyperlipidemia   Severe obesity (BMI >= 40) (HCC)   CAD (coronary artery disease)   Diabetic peripheral neuropathy (HCC)   Diabetes mellitus (HCC)   History of endometrial cancer   Stage 3b chronic kidney disease (HCC)   Syncope and collapse: - Complete workup. - Neurology input is appreciated. - Awaiting cardiology input. - MRI brain was non revealing. - Telemonitoring revealed torsades de pointes, but none overnight. - Patient has history of chronic hypokalemia. - Repeat renal panel and magnesium level. - Keep potassium above 4 and magnesium above 2.  Potassium of 3.6 today.  Replete potassium. -Troponin is flat (20) - Continue telemonitoring. - Consider outpatient sleep studies.  Hypertension: -Blood pressure is reasonably controlled.  Diabetes mellitus: - Continue to optimize. -Continue subcutaneous Semglee  and sliding scale insulin   coverage.  Acute kidney injury on chronic kidney disease stage IIIa: - Serum creatinine has improved from 1.44-1.17.  - Continue to monitor renal function and electrolytes. - Serum creatinine of 1.07 today.  Hyperlipidemia: - Continue Zetia  and atorvastatin .  Coronary artery disease: - Stable. - No chest pain. - Stable on flat troponin.  Morbid obesity: - Diet and exercise. - PCP to consider subcutaneous Ozempic  or Mounjaro i.e. if not contraindicated. - Consider outpatient sleep studies.  History of endometrial cancer:  UTI secondary to E. coli: - Pansensitive E. coli. - Start Keflex .     DVT prophylaxis: Subcutaneous Lovenox . Code Status: Full code. Family Communication:  Disposition Plan: Likely discharge back home.   Consultants:  Neurology. Cardiology.  Procedures:  Echo revealed:  1. No significant LV outflow tract or mid-cavity gradient. Left  ventricular ejection fraction, by estimation, is 50%. The left ventricle  has mildly decreased function. The left ventricle demonstrates regional  wall motion abnormalities, the apex and  peri-apical segments are akinetic. There is no LV thrombus. There is  severe concentric left ventricular hypertrophy. Left ventricular diastolic  parameters are consistent with Grade I diastolic dysfunction (impaired  relaxation).   2. Right ventricular systolic function is normal. The right ventricular  size is normal. Tricuspid regurgitation signal is inadequate for assessing  PA pressure.   3. The mitral valve is normal in structure. Trivial mitral valve  regurgitation. No evidence of mitral stenosis.   4. The aortic valve is tricuspid. There is mild calcification of the  aortic valve. Aortic valve regurgitation is not visualized. No aortic  stenosis is present.   5. The inferior vena cava is dilated in size with <50% respiratory  variability, suggesting right atrial pressure of 15 mmHg.  Antimicrobials:   None.   Subjective: No new complaints.  Objective: Vitals:   05/11/24 2126 05/12/24 0501 05/12/24 0504 05/12/24 1340  BP: (!) 123/58  122/76 120/79  Pulse: 78  79 81  Resp: 16  16 18   Temp: 97.7 F (36.5 C)  97.9 F (36.6 C) 98.4 F (36.9 C)  TempSrc: Oral  Oral Oral  SpO2: 97%  99% 98%  Weight:  (!) 146.5 kg    Height:        Intake/Output Summary (Last 24 hours) at 05/12/2024 1816 Last data filed at 05/12/2024 1340 Gross per 24 hour  Intake 1320 ml  Output 800 ml  Net 520 ml   Filed Weights   05/10/24 1756 05/12/24 0501  Weight: (!) 147.9 kg (!) 146.5 kg    Examination:  General exam: Appears calm and comfortable.  Patient is morbidly obese. Respiratory system: Clear to auscultation. Respiratory effort normal. Cardiovascular system: S1 & S2 heard Gastrointestinal system: Abdomen is obese, soft and nontender.  Central nervous system: Alert and oriented.   Data Reviewed: I have personally reviewed following labs and imaging studies  CBC: Recent Labs  Lab 05/08/24 1224 05/10/24 1856 05/11/24 0420  WBC 10.3 10.5 10.3  HGB 13.3 11.8* 12.1  HCT 43.7 36.3 36.7  MCV 93 89.4 88.9  PLT 256 235 229   Basic Metabolic Panel: Recent Labs  Lab 05/08/24 1224 05/10/24 1856 05/11/24 0420 05/11/24 1647 05/12/24 0458  NA 137 139  --  138 141  K 3.8 3.5  --  3.5 3.6  CL 97 104  --  102 103  CO2 25 26  --  28 28  GLUCOSE 410* 79  --  216* 205*  BUN 12 22  --  20 16  CREATININE 1.28* 1.44* 1.15* 1.17* 1.07*  CALCIUM  9.3 9.5  --  9.3 9.3  MG  --   --   --  2.3 2.3  PHOS  --   --   --  3.9 3.3   GFR: Estimated Creatinine Clearance: 75.6 mL/min (A) (by C-G formula based on SCr of 1.07 mg/dL (H)). Liver Function Tests: Recent Labs  Lab 05/08/24 1224 05/10/24 1856 05/11/24 1647 05/12/24 0458  AST 20 26  --   --   ALT 25 27  --   --   ALKPHOS 123 109  --   --   BILITOT 0.7 0.7  --   --   PROT 7.4 7.1  --   --   ALBUMIN 4.0 3.7 3.6 3.7   No results for  input(s): LIPASE, AMYLASE in the last 168 hours. No results for input(s): AMMONIA in the last 168 hours. Coagulation Profile: No results for input(s): INR, PROTIME in the last 168 hours. Cardiac Enzymes: No results for input(s): CKTOTAL, CKMB, CKMBINDEX, TROPONINI in the last 168 hours. BNP (last 3 results) Recent Labs    05/10/24 2207  PROBNP 198.0   HbA1C: Recent Labs    05/10/24 1605 05/11/24 0420  HGBA1C 10.5* 10.1*   CBG: Recent Labs  Lab 05/11/24 2125 05/12/24 0453 05/12/24 0751 05/12/24 1116 05/12/24 1615  GLUCAP 295* 226* 228* 225* 254*   Lipid Profile: No results for input(s): CHOL, HDL, LDLCALC, TRIG, CHOLHDL, LDLDIRECT in the last 72 hours. Thyroid  Function Tests: Recent Labs    05/11/24 0420  TSH 1.040   Anemia Panel: No results for input(s): VITAMINB12, FOLATE, FERRITIN, TIBC, IRON, RETICCTPCT in the last 72 hours. Urine analysis:    Component Value Date/Time  COLORURINE STRAW (A) 05/12/2024 0530   APPEARANCEUR CLEAR 05/12/2024 0530   APPEARANCEUR Cloudy (A) 08/06/2022 1125   LABSPEC 1.008 05/12/2024 0530   PHURINE 5.0 05/12/2024 0530   GLUCOSEU 50 (A) 05/12/2024 0530   HGBUR NEGATIVE 05/12/2024 0530   BILIRUBINUR NEGATIVE 05/12/2024 0530   BILIRUBINUR negative 11/21/2023 1835   BILIRUBINUR Negative 08/06/2022 1125   KETONESUR NEGATIVE 05/12/2024 0530   PROTEINUR NEGATIVE 05/12/2024 0530   UROBILINOGEN 0.2 11/21/2023 1835   NITRITE NEGATIVE 05/12/2024 0530   LEUKOCYTESUR NEGATIVE 05/12/2024 0530   Sepsis Labs: @LABRCNTIP (procalcitonin:4,lacticidven:4)  ) Recent Results (from the past 240 hours)  Urine Culture     Status: Abnormal   Collection Time: 05/08/24 12:29 PM   Specimen: Blood   UR  Result Value Ref Range Status   Urine Culture, Routine Final report (A)  Final   Organism ID, Bacteria Escherichia coli (A)  Final    Comment: Cefazolin  with an MIC <=16 predicts susceptibility to the oral  agents cefaclor, cefdinir, cefpodoxime, cefprozil, cefuroxime, cephalexin , and loracarbef when used for therapy of uncomplicated urinary tract infections due to E. coli, Klebsiella pneumoniae, and Proteus mirabilis. 25,000-50,000 colony forming units per mL    Antimicrobial Susceptibility Comment  Final    Comment:       ** S = Susceptible; I = Intermediate; R = Resistant **                    P = Positive; N = Negative             MICS are expressed in micrograms per mL    Antibiotic                 RSLT#1    RSLT#2    RSLT#3    RSLT#4 Amoxicillin/Clavulanic Acid    S Ampicillin                     S Cefazolin                       S Cefepime                       S Cefoxitin                      S Cefpodoxime                    S Ceftriaxone                     S Ciprofloxacin                   S Ertapenem                      S Gentamicin                     S Levofloxacin                    I Meropenem                      S Nitrofurantoin                 S Piperacillin /Tazobactam        S Tetracycline  S Tobramycin                     S Trimethoprim /Sulfa              S   Cdiff NAA+O+P+Stool Culture     Status: None (Preliminary result)   Collection Time: 05/10/24  4:00 PM   ST  Result Value Ref Range Status   Salmonella/Shigella Screen Final report  Final   Stool Culture result 1 (RSASHR) Comment  Final    Comment: No Salmonella or Shigella recovered.         Radiology Studies: ECHOCARDIOGRAM COMPLETE Result Date: 05/11/2024    ECHOCARDIOGRAM REPORT   Patient Name:   Kimberly Frazier Date of Exam: 05/11/2024 Medical Rec #:  [id number]    Height:       64.0 in Accession #:    [id number]   Weight:       326.0 lb Date of Birth:  05/27/1961    BSA:          2.408 m Patient Age:    63 years     BP:           128/77 mmHg Patient Gender: F            HR:           70 bpm. Exam Location:  Inpatient Procedure: 2D Echo, Color Doppler, Cardiac Doppler and Intracardiac             Opacification Agent (Both Spectral and Color Flow Doppler were            utilized during procedure). Indications:    Syncope R55  History:        Patient has prior history of Echocardiogram examinations, most                 recent 04/21/2020. CAD and Previous Myocardial Infarction; Risk                 Factors:Hypertension, Dyslipidemia and Diabetes.  Sonographer:    Tinnie Gosling RDCS Referring Phys: [id number] MOHAMMAD L GARBA IMPRESSIONS  1. No significant LV outflow tract or mid-cavity gradient. Left ventricular ejection fraction, by estimation, is 50%. The left ventricle has mildly decreased function. The left ventricle demonstrates regional wall motion abnormalities, the apex and peri-apical segments are akinetic. There is no LV thrombus. There is severe concentric left ventricular hypertrophy. Left ventricular diastolic parameters are consistent with Grade I diastolic dysfunction (impaired relaxation).  2. Right ventricular systolic function is normal. The right ventricular size is normal. Tricuspid regurgitation signal is inadequate for assessing PA pressure.  3. The mitral valve is normal in structure. Trivial mitral valve regurgitation. No evidence of mitral stenosis.  4. The aortic valve is tricuspid. There is mild calcification of the aortic valve. Aortic valve regurgitation is not visualized. No aortic stenosis is present.  5. The inferior vena cava is dilated in size with <50% respiratory variability, suggesting right atrial pressure of 15 mmHg. FINDINGS  Left Ventricle: No significant LV outflow tract or mid-cavity gradient. Left ventricular ejection fraction, by estimation, is 50%. The left ventricle has mildly decreased function. The left ventricle demonstrates regional wall motion abnormalities. The left ventricular internal cavity size was normal in size. There is severe concentric left ventricular hypertrophy. Left ventricular diastolic parameters are consistent with Grade I diastolic  dysfunction (impaired relaxation). Right Ventricle: The right ventricular size is normal. No increase in right ventricular wall thickness. Right ventricular systolic function is  normal. Tricuspid regurgitation signal is inadequate for assessing PA pressure. Left Atrium: Left atrial size was normal in size. Right Atrium: Right atrial size was normal in size. Pericardium: There is no evidence of pericardial effusion. Mitral Valve: The mitral valve is normal in structure. Trivial mitral valve regurgitation. No evidence of mitral valve stenosis. Tricuspid Valve: The tricuspid valve is normal in structure. Tricuspid valve regurgitation is not demonstrated. Aortic Valve: The aortic valve is tricuspid. There is mild calcification of the aortic valve. Aortic valve regurgitation is not visualized. No aortic stenosis is present. Pulmonic Valve: The pulmonic valve was not well visualized. Pulmonic valve regurgitation is not visualized. Aorta: The aortic root is normal in size and structure. Venous: The inferior vena cava is dilated in size with less than 50% respiratory variability, suggesting right atrial pressure of 15 mmHg. IAS/Shunts: No atrial level shunt detected by color flow Doppler.  LEFT VENTRICLE PLAX 2D LVIDd:         4.40 cm     Diastology LVIDs:         2.20 cm     LV e' medial:    2.50 cm/s LV PW:         1.80 cm     LV E/e' medial:  36.6 LV IVS:        1.80 cm     LV e' lateral:   3.92 cm/s LVOT diam:     2.00 cm     LV E/e' lateral: 23.4 LV SV:         79 LV SV Index:   33 LVOT Area:     3.14 cm LV IVRT:       211 msec  LV Volumes (MOD) LV vol d, MOD A4C: 73.7 ml LV vol s, MOD A4C: 39.0 ml LV SV MOD A4C:     73.7 ml RIGHT VENTRICLE             IVC RV S prime:     14.80 cm/s  IVC diam: 2.40 cm TAPSE (M-mode): 2.2 cm LEFT ATRIUM             Index        RIGHT ATRIUM           Index LA diam:        4.30 cm 1.79 cm/m   RA Area:     10.90 cm LA Vol (A2C):   44.3 ml 18.40 ml/m  RA Volume:   17.80 ml  7.39 ml/m  LA Vol (A4C):   35.4 ml 14.70 ml/m LA Biplane Vol: 42.4 ml 17.61 ml/m  AORTIC VALVE LVOT Vmax:   104.00 cm/s LVOT Vmean:  75.000 cm/s LVOT VTI:    0.250 m  AORTA Ao Root diam: 3.20 cm Ao Asc diam:  3.80 cm MITRAL VALVE MV Area (PHT): 2.27 cm     SHUNTS MV E velocity: 91.60 cm/s   Systemic VTI:  0.25 m MV A velocity: 125.00 cm/s  Systemic Diam: 2.00 cm MV E/A ratio:  0.73 Dalton McleanMD Electronically signed by Ezra Kanner Signature Date/Time: 05/11/2024/4:40:02 PM    Final    MR BRAIN WO CONTRAST Result Date: 05/11/2024 EXAM: MRI BRAIN WITHOUT CONTRAST 05/11/2024 08:09:52 AM TECHNIQUE: Multiplanar multisequence MRI of the head/brain was performed without the administration of intravenous contrast. COMPARISON: CT head 05/10/2024. CLINICAL HISTORY: Syncope/presyncope, cerebrovascular cause suspected. FINDINGS: BRAIN AND VENTRICLES: No acute infarct. No intracranial hemorrhage. No mass. No midline shift. No hydrocephalus. Scattered areas of T2 and FLAIR  hyperintensity in the periventricular and subcortical white matter favored to reflect mild chronic microvascular ischemic changes. Chronic microhemorrhages in the bilateral basal ganglia. 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 Of the mesial temporal lobes and hippocampi without signal abnormality internal architecture of the hippocampi may appear preserved. Fornices are symmetric. The sella is unremarkable. Normal flow voids. ORBITS: No acute abnormality. SINUSES AND MASTOIDS: Bilateral mastoid effusions. Mucous retention cysts in the maxillary sinuses. BONES AND SOFT TISSUES: Normal marrow signal. No acute soft tissue abnormality. IMPRESSION: 1. No acute intracranial abnormality. 2. 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 3. Mild chronic microvascular ischemic changes. 4. Chronic microhemorrhages in the bilateral basal ganglia. Electronically signed by: Donnice Mania MD 05/11/2024 08:24 AM EST RP Workstation: [id number]   CT Angio  Chest PE W and/or Wo Contrast Result Date: 05/10/2024 CLINICAL DATA:  Multiple syncope EXAM: CT ANGIOGRAPHY CHEST WITH CONTRAST TECHNIQUE: Multidetector CT imaging of the chest was performed using the standard protocol during bolus administration of intravenous contrast. Multiplanar CT image reconstructions and MIPs were obtained to evaluate the vascular anatomy. RADIATION DOSE REDUCTION: This exam was performed according to the departmental dose-optimization program which includes automated exposure control, adjustment of the mA and/or kV according to patient size and/or use of iterative reconstruction technique. CONTRAST:  60mL OMNIPAQUE  IOHEXOL  350 MG/ML SOLN COMPARISON:  CT 08/17/2018 FINDINGS: Cardiovascular: Slightly limited by habitus. No acute pulmonary embolus to the segmental level. Mild atherosclerosis. No aneurysm. Cardiomegaly with small pericardial effusion. Coronary vascular calcification. Left ventricular apical wall thickening as was seen on prior exams consistent with aneurysm and probable prior infarct. Mediastinum/Nodes: Patent trachea. No suspicious thyroid  nodules. No suspicious lymph nodes. Esophagus is within normal limits Lungs/Pleura: No acute airspace disease, pleural effusion, or pneumothorax Upper Abdomen: No acute finding. Stable 2 cm left adrenal nodule consistent with adenoma, no imaging follow-up is recommended. Possible small gallstones. Musculoskeletal: No acute osseous abnormality. Review of the MIP images confirms the above findings. IMPRESSION: 1. Negative for acute pulmonary embolus. 2. Cardiomegaly with small pericardial effusion. 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 Electronically Signed   By: Luke Bun M.D.   On: 05/10/2024 21:05   CT HEAD WO CONTRAST ( ) Result Date: 05/10/2024 CLINICAL DATA:  New onset seizure syncope EXAM: CT HEAD WITHOUT CONTRAST TECHNIQUE: Contiguous axial images were  obtained from the base of the skull through the vertex without intravenous contrast. RADIATION DOSE REDUCTION: This exam was performed according to the departmental dose-optimization program which includes automated exposure control, adjustment of the mA and/or kV according to patient size and/or use of iterative reconstruction technique. COMPARISON:  MRI 11/29/2019, CT 11/26/2019 FINDINGS: Brain: No acute territorial infarction, hemorrhage or intracranial mass. The ventricles are nonenlarged Vascular: No hyperdense vessel or unexpected calcification. Skull: Normal. Negative for fracture or focal lesion. Sinuses/Orbits: Old appearing fracture deformity medial wall right orbit Other: None IMPRESSION: Negative non contrasted CT appearance of the brain. Electronically Signed   By: Luke Bun M.D.   On: 05/10/2024 20:56        Scheduled Meds:  amLODipine   10 mg Oral Daily   aspirin  EC  81 mg Oral Daily   atorvastatin   80 mg Oral Daily   carvedilol   25 mg Oral BID WC   cephALEXin   500 mg Oral Q6H   enoxaparin  (LOVENOX ) injection  40 mg Subcutaneous Q24H   ezetimibe   10 mg Oral Daily   insulin  aspart  0-20 Units Subcutaneous TID WC  insulin  aspart  0-5 Units Subcutaneous QHS   insulin  glargine-yfgn  60 Units Subcutaneous Daily   isosorbide  mononitrate  60 mg Oral Daily   sodium chloride  flush  3 mL Intravenous Q12H   Continuous Infusions:   LOS: 2 days    Time spent: 35 minutes    Leatrice Chapel, MD  [facility] Pager #: [phone] 7PM-7AM contact night coverage as above

## 2024-05-13 ENCOUNTER — Other Ambulatory Visit: Payer: Self-pay | Admitting: Physician Assistant

## 2024-05-13 DIAGNOSIS — R55 Syncope and collapse: Secondary | ICD-10-CM

## 2024-05-13 LAB — GLUCOSE, CAPILLARY
Glucose-Capillary: 238 mg/dL — ABNORMAL HIGH (ref 70–99)
Glucose-Capillary: 249 mg/dL — ABNORMAL HIGH (ref 70–99)
Glucose-Capillary: 280 mg/dL — ABNORMAL HIGH (ref 70–99)

## 2024-05-13 MED ORDER — POTASSIUM CHLORIDE CRYS ER 20 MEQ PO TBCR
40.0000 meq | EXTENDED_RELEASE_TABLET | Freq: Once | ORAL | Status: AC
  Administered 2024-05-13: 40 meq via ORAL
  Filled 2024-05-13: qty 2

## 2024-05-13 MED ORDER — POTASSIUM CHLORIDE CRYS ER 20 MEQ PO TBCR: 20.0000 meq | EXTENDED_RELEASE_TABLET | Freq: Two times a day (BID) | ORAL | 1 refills | Status: AC

## 2024-05-13 MED ORDER — CEPHALEXIN 500 MG PO CAPS: 500.0000 mg | ORAL_CAPSULE | Freq: Four times a day (QID) | ORAL | 0 refills | Status: AC

## 2024-05-13 MED ORDER — INSULIN LISPRO (1 UNIT DIAL) 100 UNIT/ML (KWIKPEN): 8.0000 [IU] | PEN_INJECTOR | Freq: Three times a day (TID) | SUBCUTANEOUS | 0 refills | Status: AC

## 2024-05-13 NOTE — Plan of Care (Signed)
   Problem: Clinical Measurements: Goal: Ability to maintain clinical measurements within normal limits will improve Outcome: Progressing Goal: Will remain free from infection Outcome: Progressing

## 2024-05-13 NOTE — Consult Note (Signed)
 CONSULTATION NOTE   Patient Name: Kimberly Frazier Date of Encounter: 05/13/2024 Cardiologist: Shelda Bruckner, MD Electrophysiologist: None Advanced Heart Failure: None   Chief Complaint   syncope  Patient Profile   63 yo female with history of type 2 diabetes, hyperlipidemia, hypertension and morbid obesity as well as prior coronary artery disease with PCI and stage III chronic kidney disease, prior stroke who presented the ER with an episode of unresponsiveness.  HPI   Kimberly Frazier is a 63 y.o. female who is being seen today for the evaluation of possible syncope at the request of Dr. Rosario.  This is a 63 year old female with a history of coronary disease which is medically managed, stage IIIb chronic kidney disease, prior stroke, diabetes, hypertension and dyslipidemia as well as morbid obesity, followed by Dr. Bruckner for cardiology.  She apparently was at her doctor's office yesterday and she reports having had several syncopal episodes in the past 3 months.  She developed heaviness in her shoulders and apparently became unresponsive for several minutes.  She was described as having staring episodes and then subsequently was confused.  She presented to the emergency department for evaluation.  Initial ER workup was unremarkable including a low troponin of 20, normal blood pressure, elevated creatinine at 1.44 which is known.  CT angiogram of the chest was negative for PE but did show cardiomegaly and small pericardial effusion.  Echo yesterday showed LVEF 50% with apical and periapical akinesis.  There was severe concentric LVH.  No significant valvular disease.  She has reported a nonproductive cough.  She did have a cold recently and had stopped some of her medicines.  proBNP was normal at 198.  PMHx   Past Medical History:  Diagnosis Date   Adrenal adenoma, left    Arthritis    Cancer (HCC)    CKD (chronic kidney disease), stage II    Coronary artery disease  07-28-2018 followed [facility]  currently due to no insurance   per cardiac cath 02-06-2015 (positive mild lateral ishcemia on stress test)--- dLAD 80%,  mLAD 40%,  mPDA 80% (small vessel),  ostial D1 70%,  CFx with lumial irregarlities-- medical management   Depression    Diabetic neuropathy (HCC)    Diabetic retinopathy (HCC)    NPDR OU   Headache    History of Bell's palsy 07/2011   per pt residual facial pain on left side occasionally   History of cancer of vagina 1999   per pt completed radiation and chemo   History of sepsis 12/30/2017   positive blood culter fro E.coli   Hyperlipidemia    Hypertension    Hypertensive retinopathy    OU   Hypokalemia    Insulin  dependent type 2 diabetes mellitus, uncontrolled    followed by pcp---  A1c was 11.7 on 06-15-2018 in epic   Myocardial infarction [facility])    10 yrs ago?   Nocturia    Peripheral neuropathy    PMB (postmenopausal bleeding)    Wears dentures    upper   Wears glasses     Past Surgical History:  Procedure Laterality Date   BREAST BIOPSY  2012   benign   CARDIAC CATHETERIZATION N/A 02/06/2015   Procedure: Left Heart Cath and Coronary Angiography;  Surgeon: Ezra GORMAN Shuck, MD;  Location: [facility] INVASIVE CV LAB;  Service: Cardiovascular;  Laterality: N/A;   CATARACT EXTRACTION Bilateral OD: 03/07/19, OS: 02/28/19   Dr. Roz   CATARACT EXTRACTION, BILATERAL  COLONOSCOPY     normal exam in [place], [place] abour 10-12 years ago   EYE SURGERY Bilateral OD: 03/07/19, OS: 02/28/19   Cat Sx - Dr. Roz   HYSTEROSCOPY WITH D & C N/A 08/01/2018   Procedure: DILATATION AND CURETTAGE /HYSTEROSCOPY;  Surgeon: Alger Gong, MD;  Location: [facility];  Service: Gynecology;  Laterality: N/A;   ROBOTIC ASSISTED TOTAL HYSTERECTOMY WITH BILATERAL SALPINGO OOPHERECTOMY N/A 11/28/2018   Procedure: XI ROBOTIC ASSISTED TOTAL HYSTERECTOMY WITH BILATERAL SALPINGO OOPHORECTOMY;  Surgeon: Eloy Herring,  MD;  Location: [facility];  Service: Gynecology;  Laterality: N/A;   SENTINEL NODE BIOPSY N/A 11/28/2018   Procedure: SENTINEL NODE BIOPSY;  Surgeon: Eloy Herring, MD;  Location: [facility];  Service: Gynecology;  Laterality: N/A;   UMBILICAL HERNIA REPAIR  child    FAMHx   Family History  Problem Relation Age of Onset   Heart disease Mother    Hypertension Mother    Diabetes Mother    Cancer Father        hodgkins lymphoma   Heart disease Sister        heart attack   Diabetes Sister    Colon cancer Brother 28   Diabetes Maternal Aunt    Diabetes Maternal Uncle    Cancer Other        parent   Diabetes Other        parent   Heart disease Other        parent   Hyperlipidemia Other        parent   Hypertension Other        parent   Arthritis Other        parent   Breast cancer Neg Hx    Colon polyps Neg Hx    Esophageal cancer Neg Hx    Rectal cancer Neg Hx    Stomach cancer Neg Hx     SOCHx    reports that she has never smoked. She has never used smokeless tobacco. She reports that she does not currently use alcohol . She reports that she does not currently use drugs.  Outpatient Medications   Medications Ordered Prior to Encounter[1]  Inpatient Medications    Scheduled Meds:  amLODipine   10 mg Oral Daily   aspirin  EC  81 mg Oral Daily   atorvastatin   80 mg Oral Daily   carvedilol   25 mg Oral BID WC   cephALEXin   500 mg Oral Q6H   enoxaparin  (LOVENOX ) injection  40 mg Subcutaneous Q24H   ezetimibe   10 mg Oral Daily   insulin  aspart  0-20 Units Subcutaneous TID WC   insulin  aspart  0-5 Units Subcutaneous QHS   insulin  glargine-yfgn  60 Units Subcutaneous Daily   isosorbide  mononitrate  60 mg Oral Daily   sodium chloride  flush  3 mL Intravenous Q12H    Continuous Infusions:   PRN Meds: acetaminophen , nitroGLYCERIN , ondansetron  **OR** ondansetron  (ZOFRAN ) IV, tiZANidine    ALLERGIES   Allergies[2]  ROS   Pertinent items noted in HPI and remainder of  comprehensive ROS otherwise negative.  Vitals   Vitals:   05/12/24 0504 05/12/24 1340 05/12/24 2107 05/13/24 0521  BP: 122/76 120/79 (!) 113/57 124/77  Pulse: 79 81 68 75  Resp: 16 18 17 17   Temp: 97.9 F (36.6 C) 98.4 F (36.9 C) 98.1 F (36.7 C) 98 F (36.7 C)  TempSrc: Oral Oral Oral Oral  SpO2: 99% 98% 99% 98%  Weight:    (!) 140.5 kg  Height:        Intake/Output Summary (Last 24 hours) at 05/13/2024 0934 Last data filed at 05/13/2024 9072 Gross per 24 hour  Intake 1433 ml  Output --  Net 1433 ml   Filed Weights   05/10/24 1756 05/12/24 0501 05/13/24 0521  Weight: (!) 147.9 kg (!) 146.5 kg (!) 140.5 kg    Physical Exam   General appearance: alert, no distress, and morbidly obese Neck: no carotid bruit, no JVD, and thyroid  not enlarged, symmetric, no tenderness/mass/nodules Lungs: clear to auscultation bilaterally Heart: regular rate and rhythm Extremities: edema trace sock line Neurologic: Grossly normal  Labs   Results for orders placed or performed during the hospital encounter of 05/10/24 (from the past 48 hours)  Glucose, capillary     Status: Abnormal   Collection Time: 05/11/24 11:32 AM  Result Value Ref Range   Glucose-Capillary 187 (H) 70 - 99 mg/dL    Comment: Glucose reference range applies only to samples taken after fasting for at least 8 hours.  Renal function panel     Status: Abnormal   Collection Time: 05/11/24  4:47 PM  Result Value Ref Range   Sodium 138 135 - 145 mmol/L   Potassium 3.5 3.5 - 5.1 mmol/L   Chloride 102 98 - 111 mmol/L   CO2 28 22 - 32 mmol/L   Glucose, Bld 216 (H) 70 - 99 mg/dL    Comment: Glucose reference range applies only to samples taken after fasting for at least 8 hours.   BUN 20 8 - 23 mg/dL   Creatinine, Ser 8.82 (H) 0.44 - 1.00 mg/dL   Calcium  9.3 8.9 - 10.3 mg/dL   Phosphorus 3.9 2.5 - 4.6 mg/dL   Albumin 3.6 3.5 - 5.0 g/dL   GFR, Estimated 52 (L) >60 mL/min    Comment: (NOTE) Calculated using the  CKD-EPI Creatinine Equation (2021)    Anion gap 8 5 - 15    Comment: Performed at [facility], [address] [place], [place] [address]  Magnesium     Status: None   Collection Time: 05/11/24  4:47 PM  Result Value Ref Range   Magnesium 2.3 1.7 - 2.4 mg/dL    Comment: Performed at [facility], [address], [place], [place] [address]  Glucose, capillary     Status: Abnormal   Collection Time: 05/11/24  4:50 PM  Result Value Ref Range   Glucose-Capillary 194 (H) 70 - 99 mg/dL    Comment: Glucose reference range applies only to samples taken after fasting for at least 8 hours.  Glucose, capillary     Status: Abnormal   Collection Time: 05/11/24  9:25 PM  Result Value Ref Range   Glucose-Capillary 295 (H) 70 - 99 mg/dL    Comment: Glucose reference range applies only to samples taken after fasting for at least 8 hours.  Glucose, capillary     Status: Abnormal   Collection Time: 05/12/24  4:53 AM  Result Value Ref Range   Glucose-Capillary 226 (H) 70 - 99 mg/dL    Comment: Glucose reference range applies only to samples taken after fasting for at least 8 hours.  Renal function panel     Status: Abnormal   Collection Time: 05/12/24  4:58 AM  Result Value Ref Range   Sodium 141 135 - 145 mmol/L   Potassium 3.6 3.5 - 5.1 mmol/L   Chloride 103 98 - 111 mmol/L   CO2 28 22 - 32 mmol/L  Glucose, Bld 205 (H) 70 - 99 mg/dL    Comment: Glucose reference range applies only to samples taken after fasting for at least 8 hours.   BUN 16 8 - 23 mg/dL   Creatinine, Ser 8.92 (H) 0.44 - 1.00 mg/dL   Calcium  9.3 8.9 - 10.3 mg/dL   Phosphorus 3.3 2.5 - 4.6 mg/dL   Albumin 3.7 3.5 - 5.0 g/dL   GFR, Estimated 57 (L) >60 mL/min    Comment: (NOTE) Calculated using the CKD-EPI Creatinine Equation (2021)    Anion gap 10 5 - 15    Comment: Performed at [facility], [address], [place], [place] [address]  Magnesium     Status: None    Collection Time: 05/12/24  4:58 AM  Result Value Ref Range   Magnesium 2.3 1.7 - 2.4 mg/dL    Comment: Performed at [facility], [address] [place], [place] [address]  Urinalysis, Complete w Microscopic -Urine, Clean Catch     Status: Abnormal   Collection Time: 05/12/24  5:30 AM  Result Value Ref Range   Color, Urine STRAW (A) YELLOW   APPearance CLEAR CLEAR   Specific Gravity, Urine 1.008 1.005 - 1.030   pH 5.0 5.0 - 8.0   Glucose, UA 50 (A) NEGATIVE mg/dL   Hgb urine dipstick NEGATIVE NEGATIVE   Bilirubin Urine NEGATIVE NEGATIVE   Ketones, ur NEGATIVE NEGATIVE mg/dL   Protein, ur NEGATIVE NEGATIVE mg/dL   Nitrite NEGATIVE NEGATIVE   Leukocytes,Ua NEGATIVE NEGATIVE   RBC / HPF 0-5 0 - 5 RBC/hpf   WBC, UA 0-5 0 - 5 WBC/hpf   Bacteria, UA NONE SEEN NONE SEEN   Squamous Epithelial / HPF 0-5 0 - 5 /HPF    Comment: Performed at [facility], [address], [place], [place] [address]  Glucose, capillary     Status: Abnormal   Collection Time: 05/12/24  7:51 AM  Result Value Ref Range   Glucose-Capillary 228 (H) 70 - 99 mg/dL    Comment: Glucose reference range applies only to samples taken after fasting for at least 8 hours.  Glucose, capillary     Status: Abnormal   Collection Time: 05/12/24 11:16 AM  Result Value Ref Range   Glucose-Capillary 225 (H) 70 - 99 mg/dL    Comment: Glucose reference range applies only to samples taken after fasting for at least 8 hours.  Glucose, capillary     Status: Abnormal   Collection Time: 05/12/24  4:15 PM  Result Value Ref Range   Glucose-Capillary 254 (H) 70 - 99 mg/dL    Comment: Glucose reference range applies only to samples taken after fasting for at least 8 hours.  Glucose, capillary     Status: Abnormal   Collection Time: 05/12/24  9:04 PM  Result Value Ref Range   Glucose-Capillary 233 (H) 70 - 99 mg/dL    Comment: Glucose reference range applies only to samples taken after fasting for at  least 8 hours.  Glucose, capillary     Status: Abnormal   Collection Time: 05/13/24  5:19 AM  Result Value Ref Range   Glucose-Capillary 238 (H) 70 - 99 mg/dL    Comment: Glucose reference range applies only to samples taken after fasting for at least 8 hours.  Glucose, capillary     Status: Abnormal   Collection Time: 05/13/24  7:47 AM  Result Value Ref Range   Glucose-Capillary 249 (H) 70 - 99 mg/dL  Comment: Glucose reference range applies only to samples taken after fasting for at least 8 hours.   *Note: Due to a large number of results and/or encounters for the requested time period, some results have not been displayed. A complete set of results can be found in Results Review.    ECG   Sinus rhythm at 78 (05/11/24)- Personally Reviewed  Telemetry   Sinus rhythm with short episode of 8 beats NSVT, rate around 120- Personally Reviewed  Radiology   ECHOCARDIOGRAM COMPLETE Result Date: 05/11/2024    ECHOCARDIOGRAM REPORT   Patient Name:   AVANNA SOWDER Date of Exam: 05/11/2024 Medical Rec #:  [id number]    Height:       64.0 in Accession #:    [id number]   Weight:       326.0 lb Date of Birth:  June 04, 1960    BSA:          2.408 m Patient Age:    63 years     BP:           128/77 mmHg Patient Gender: F            HR:           70 bpm. Exam Location:  Inpatient Procedure: 2D Echo, Color Doppler, Cardiac Doppler and Intracardiac            Opacification Agent (Both Spectral and Color Flow Doppler were            utilized during procedure). Indications:    Syncope R55  History:        Patient has prior history of Echocardiogram examinations, most                 recent 04/21/2020. CAD and Previous Myocardial Infarction; Risk                 Factors:Hypertension, Dyslipidemia and Diabetes.  Sonographer:    Tinnie Gosling RDCS Referring Phys: [id number] MOHAMMAD L GARBA IMPRESSIONS  1. No significant LV outflow tract or mid-cavity gradient. Left ventricular ejection fraction, by estimation, is 50%.  The left ventricle has mildly decreased function. The left ventricle demonstrates regional wall motion abnormalities, the apex and peri-apical segments are akinetic. There is no LV thrombus. There is severe concentric left ventricular hypertrophy. Left ventricular diastolic parameters are consistent with Grade I diastolic dysfunction (impaired relaxation).  2. Right ventricular systolic function is normal. The right ventricular size is normal. Tricuspid regurgitation signal is inadequate for assessing PA pressure.  3. The mitral valve is normal in structure. Trivial mitral valve regurgitation. No evidence of mitral stenosis.  4. The aortic valve is tricuspid. There is mild calcification of the aortic valve. Aortic valve regurgitation is not visualized. No aortic stenosis is present.  5. The inferior vena cava is dilated in size with <50% respiratory variability, suggesting right atrial pressure of 15 mmHg. FINDINGS  Left Ventricle: No significant LV outflow tract or mid-cavity gradient. Left ventricular ejection fraction, by estimation, is 50%. The left ventricle has mildly decreased function. The left ventricle demonstrates regional wall motion abnormalities. The left ventricular internal cavity size was normal in size. There is severe concentric left ventricular hypertrophy. Left ventricular diastolic parameters are consistent with Grade I diastolic dysfunction (impaired relaxation). Right Ventricle: The right ventricular size is normal. No increase in right ventricular wall thickness. Right ventricular systolic function is normal. Tricuspid regurgitation signal is inadequate for assessing PA pressure. Left Atrium: Left atrial size was normal in size. Right  Atrium: Right atrial size was normal in size. Pericardium: There is no evidence of pericardial effusion. Mitral Valve: The mitral valve is normal in structure. Trivial mitral valve regurgitation. No evidence of mitral valve stenosis. Tricuspid Valve: The  tricuspid valve is normal in structure. Tricuspid valve regurgitation is not demonstrated. Aortic Valve: The aortic valve is tricuspid. There is mild calcification of the aortic valve. Aortic valve regurgitation is not visualized. No aortic stenosis is present. Pulmonic Valve: The pulmonic valve was not well visualized. Pulmonic valve regurgitation is not visualized. Aorta: The aortic root is normal in size and structure. Venous: The inferior vena cava is dilated in size with less than 50% respiratory variability, suggesting right atrial pressure of 15 mmHg. IAS/Shunts: No atrial level shunt detected by color flow Doppler.  LEFT VENTRICLE PLAX 2D LVIDd:         4.40 cm     Diastology LVIDs:         2.20 cm     LV e' medial:    2.50 cm/s LV PW:         1.80 cm     LV E/e' medial:  36.6 LV IVS:        1.80 cm     LV e' lateral:   3.92 cm/s LVOT diam:     2.00 cm     LV E/e' lateral: 23.4 LV SV:         79 LV SV Index:   33 LVOT Area:     3.14 cm LV IVRT:       211 msec  LV Volumes (MOD) LV vol d, MOD A4C: 73.7 ml LV vol s, MOD A4C: 39.0 ml LV SV MOD A4C:     73.7 ml RIGHT VENTRICLE             IVC RV S prime:     14.80 cm/s  IVC diam: 2.40 cm TAPSE (M-mode): 2.2 cm LEFT ATRIUM             Index        RIGHT ATRIUM           Index LA diam:        4.30 cm 1.79 cm/m   RA Area:     10.90 cm LA Vol (A2C):   44.3 ml 18.40 ml/m  RA Volume:   17.80 ml  7.39 ml/m LA Vol (A4C):   35.4 ml 14.70 ml/m LA Biplane Vol: 42.4 ml 17.61 ml/m  AORTIC VALVE LVOT Vmax:   104.00 cm/s LVOT Vmean:  75.000 cm/s LVOT VTI:    0.250 m  AORTA Ao Root diam: 3.20 cm Ao Asc diam:  3.80 cm MITRAL VALVE MV Area (PHT): 2.27 cm     SHUNTS MV E velocity: 91.60 cm/s   Systemic VTI:  0.25 m MV A velocity: 125.00 cm/s  Systemic Diam: 2.00 cm MV E/A ratio:  0.73 Dalton McleanMD Electronically signed by Ezra Kanner Signature Date/Time: 05/11/2024/4:40:02 PM    Final     Cardiac Studies   See echo above-images personally reviewed  Impression    Principal Problem:   Syncope and collapse Active Problems:   Essential hypertension   Hyperlipidemia   Severe obesity (BMI >= 40) (HCC)   CAD (coronary artery disease)   Diabetic peripheral neuropathy (HCC)   Diabetes mellitus (HCC)   History of endometrial cancer   Stage 3b chronic kidney disease (HCC)   Recommendation   Ms. Novoa may be having ventricular arrhythmias as  a cause of her intermittent loss of consciousness.  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  Overall LVEF is low normal since she has severe LVH otherwise.  She was noted to have short 8 beat run of NSVT.  Her hospitalist attending noted to arrhythmia runs on the chart, however I believe both are artifact.  QRS spikes can be seen on top of the wavy baseline.  The very rapid torsade appearing rhythm also is likely artifactual.  This could be replicated by waving the EKG lead rapidly.  That being said I do believe she had some nonsustained VT.  For that I would advise an outpatient monitor as she could be having intermittent episodes of sustained VT which could be causing her attacks.  Magnesium is normal but potassium was 3.6.  Would likely add potassium to keep her over 4.0.  This will need to be monitored carefully given moderate chronic kidney disease.  She was on 20 mEq potassium at home -but probably needs 40 mEq daily for maintenance.  She is already on a good dose of carvedilol  25 mg twice daily.  Thanks for the consultation.  Will arrange for outpatient monitoring likely next week.  From a cardiac standpoint I think she can be discharged today.  Will also arrange for follow-up at [facility].  Time Spent Directly with Patient:  I have spent a total of 45 minutes with the patient reviewing hospital notes, telemetry, EKGs, labs and examining the patient as well as establishing an assessment and plan that was discussed personally with the patient.  > 50% of time  was spent in direct patient care.  Length of Stay:  LOS: 3 days   Vinie KYM Maxcy, MD, [facility], FNLA, FACP  [place]  [facility]  Medical Director of the [facility] Lip[facility]  Cardiovascular Risk Reduction Clinic Diplomate of the American Board of Clinical Lipidology Attending Cardiologist  Direct Dial : [phone]  Fax: [phone]  Website:  [email or web address]   Vinie BROCKS Paiden Caraveo 05/13/2024, 9:34 AM       [1]  No current facility-administered medications on file prior to encounter.   Current Outpatient Medications on File Prior to Encounter  Medication Sig Dispense Refill   Accu-Chek Softclix Lancets lancets Use to check blood sugar 3 times daily. 100 each 6   acetaminophen  (TYLENOL ) 500 MG tablet Take 2 tablets (1,000 mg total) by mouth every 6 (six) hours as needed. 100 tablet 2   amLODipine  (NORVASC ) 10 MG tablet Take 1 tablet (10 mg total) by mouth daily. 90 tablet 1   aspirin  EC 81 MG tablet Take 1 tablet (81 mg total) daily by mouth. 100 tablet 1   atorvastatin  (LIPITOR) 80 MG tablet Take 1 tablet (80 mg total) by mouth once daily. (PLEASE SCHEDULE AN APPT FOR FUTURE REFILLS) 90 tablet 1   Blood Glucose Monitoring Suppl (ACCU-CHEK GUIDE) w/Device KIT 1 kit by Does not apply route in the morning, at noon, and at bedtime. Use to check blood sugar 3 times daily. 1 kit 0   brimonidine  (ALPHAGAN ) 0.2 % ophthalmic solution Place 1 drop into both eyes 2 (two) times daily. 10 mL 6   carvedilol  (COREG ) 25 MG tablet Take 1 tablet (25 mg total) by mouth 2 (two) times daily with a meal. 180 tablet 1   ciprofloxacin  (CIPRO ) 500 MG tablet Take 1 tablet (500 mg total) by mouth 2 (two) times daily for 7 days. 14 tablet 0  Continuous Blood Gluc Receiver (FREESTYLE LIBRE 2 READER) DEVI Use as directed 1 each 0   Continuous Blood Gluc Sensor (FREESTYLE LIBRE SENSOR SYSTEM) MISC Change sensor every 2 weeks 2 each 12   dorzolamide -timolol  (COSOPT ) 2-0.5 % ophthalmic solution  Place 1 drop into both eyes 2 (two) times daily. 10 mL 3   ezetimibe  (ZETIA ) 10 MG tablet Take 1 tablet (10 mg total) by mouth daily. PATIENT MUST CALL AND SCHEDULE OVERDUE ANNUAL APPOINTMENT FOR FURTHER REFILLS.FIRST ATTEMPT 15 tablet 0   glimepiride  (AMARYL ) 2 MG tablet Take 1 tablet (2 mg total) by mouth daily before breakfast. 90 tablet 0   glucose blood (ACCU-CHEK GUIDE) test strip Use to check blood sugar 3 times daily. 100 each 6   insulin  glargine (LANTUS  SOLOSTAR) 100 UNIT/ML Solostar Pen Inject 64 Units into the skin daily. 60 mL 1   insulin  lispro (HUMALOG  KWIKPEN) 100 UNIT/ML KwikPen Inject 34 Units into the skin in the morning and at bedtime. 60 mL 1   Insulin  Pen Needle (TECHLITE PEN NEEDLES) 32G X 4 MM MISC Use as directed. 300 each 2   isosorbide  mononitrate (IMDUR ) 60 MG 24 hr tablet Take 1 tablet (60 mg total) by mouth daily. 90 tablet 3   ketorolac  (ACULAR ) 0.5 % ophthalmic solution Place 1 drop into both eyes 4 (four) times daily. 10 mL 3   nitroGLYCERIN  (NITROSTAT ) 0.4 MG SL tablet Place 1 tab under tongue for chest pain.  May repeat after 5 minutes x 2.  DO NOT TAKE MORE THAN 3 TABS DURING AN EPISODE OF CHEST PAIN.Pt must schedule an overdue followup appt with Cardiology for any more refills. [phone] 1st attempt Thank You 25 tablet 0   omeprazole  (PRILOSEC) 20 MG capsule Take 1 capsule (20 mg total) by mouth daily. (Patient taking differently: Take 20 mg by mouth as needed (Acid reflux).) 30 capsule 0   potassium chloride  (KLOR-CON ) 10 MEQ tablet Take 2 tablets (20 mEq total) by mouth daily. (Patient taking differently: Take 10 mEq by mouth 2 (two) times daily. Patient separates doses by 3 hours) 180 tablet 3   tiZANidine  (ZANAFLEX ) 4 MG tablet Take 1 tablet (4 mg total) by mouth every 8 (eight) hours as needed for muscle spasms. 90 tablet 1   diclofenac  Sodium (VOLTAREN ) 1 % GEL Apply 2 g topically 2 (two) times daily as needed. (Patient not taking: Reported on 05/10/2024)  100 g 1   prednisoLONE  acetate (PRED FORTE ) 1 % ophthalmic suspension Place 1 drop into both eyes daily. (Patient not taking: Reported on 05/10/2024) 10 mL 1   Semaglutide ,0.25 or 0.5MG /DOS, (OZEMPIC , 0.25 OR 0.5 MG/DOSE,) 2 MG/3ML SOPN Inject 0.25 mg into the skin once a week. (Patient not taking: Reported on 05/10/2024) 3 mL 1  [2]  Allergies Allergen Reactions   Jardiance  [Empagliflozin ] Other (See Comments)    Frequent yeast infection   Metformin  And Related Diarrhea

## 2024-05-13 NOTE — Progress Notes (Signed)
 Ordered live monitor for syncope, Dr. Lonni to read.   Follow up has been arranged.

## 2024-05-13 NOTE — Discharge Summary (Signed)
 Physician Discharge Summary  Patient ID: Kimberly Frazier MRN: [id number] DOB/AGE: 1960/11/08 63 y.o.  Admit date: 05/10/2024 Discharge date: 05/13/2024  Admission Diagnoses:  Discharge Diagnoses:  Principal Problem:   Syncope and collapse Active Problems:   Essential hypertension   Hyperlipidemia   Severe obesity (BMI >= 40) (HCC)   CAD (coronary artery disease)   Diabetic peripheral neuropathy (HCC)   Diabetes mellitus (HCC)   History of endometrial cancer   Stage 3b chronic kidney disease (HCC)   Discharged Condition: stable  Hospital Course: Patient is a 63 year old female, morbidly obese, with multiple medical and cardiac history.  Patient carries diagnosis of chronic kidney disease stage IIIa, coronary artery disease, diabetes mellitus with neuropathy and retinopathy hyperlipidemia, hypertension, chronic hypokalemia, peripheral neuropathy and recurrent syncope.  Patient was admitted with syncope and collapse.  Workup revealed likely intermittent ventricular weakness.  Cardiology team assisted in directing patient's care.  Patient has been cleared for discharge by the cardiology team.  Cardiology team will arrange cardiac Holter placement.  Patient will follow-up primary care provider and cardiology team on discharge.    Syncope and collapse: - Complete workup. - Neurology input is appreciated. - Awaiting cardiology input. - MRI brain was non revealing. - Telemonitoring revealed torsades de pointes, but none overnight. - Patient has history of chronic hypokalemia. - Repeat renal panel and magnesium level. - Keep potassium above 4 and magnesium above 2.  Potassium of 3.6 today.  Replete potassium. -Troponin is flat (20) - Continue telemonitoring. - Consider outpatient sleep studies.   Hypertension: -Blood pressure is reasonably controlled.   Diabetes mellitus: - Continue to optimize. -Continue subcutaneous Semglee  and sliding scale insulin  coverage.   Acute kidney  injury on chronic kidney disease stage IIIa: - Serum creatinine has improved from 1.44-1.17.  - Continue to monitor renal function and electrolytes. - Serum creatinine of 1.07 today.   Hyperlipidemia: - Continue Zetia  and atorvastatin .   Coronary artery disease: - Stable. - No chest pain. - Stable on flat troponin.   Morbid obesity: - Diet and exercise. - PCP to consider subcutaneous Ozempic  or Mounjaro i.e. if not contraindicated. - Consider outpatient sleep studies.   History of endometrial cancer:   UTI secondary to E. coli: - Pansensitive E. coli. - Start Keflex .    Consults: cardiology As per cardiology team: Kimberly Frazier may be having ventricular arrhythmias as a cause of her intermittent loss of consciousness.  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  Overall LVEF is low normal since she has severe LVH otherwise.  She was noted to have short 8 beat run of NSVT.  Her hospitalist attending noted to arrhythmia runs on the chart, however I believe both are artifact.  QRS spikes can be seen on top of the wavy baseline.  The very rapid torsade appearing rhythm also is likely artifactual.  This could be replicated by waving the EKG lead rapidly.  That being said I do believe she had some nonsustained VT.  For that I would advise an outpatient monitor as she could be having intermittent episodes of sustained VT which could be causing her attacks.  Magnesium is normal but potassium was 3.6.  Would likely add potassium to keep her over 4.0.  This will need to be monitored carefully given moderate chronic kidney disease.  She was on 20 mEq potassium at home -but probably needs 40 mEq daily for maintenance.  She is already on a good dose  of carvedilol  25 mg twice daily.   Thanks for the consultation.  Will arrange for outpatient monitoring likely next week.  From a cardiac standpoint I think she can be discharged today.  Will also arrange  for follow-up at [facility].  Significant Diagnostic Studies:  CT angio chest revealed: 1. Negative for acute pulmonary embolus. 2. Cardiomegaly with small pericardial effusion. 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     Electronically Signed   By: Luke Bun M.D.   On: 05/10/2024 21:05  CT head without contrast was negative.  MRI brain without contrast revealed: 1. No acute intracranial abnormality. 2. 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 3. Mild chronic microvascular ischemic changes. 4. Chronic microhemorrhages in the bilateral basal ganglia.   Electronically signed by: Donnice Mania MD 05/11/2024 08:24 AM EST RP Workstation: [id number]  Echo revealed:  1. No significant LV outflow tract or mid-cavity gradient. Left  ventricular ejection fraction, by estimation, is 50%. The left ventricle  has mildly decreased function. The left ventricle demonstrates regional  wall motion abnormalities, the apex and  peri-apical segments are akinetic. There is no LV thrombus. There is  severe concentric left ventricular hypertrophy. Left ventricular diastolic  parameters are consistent with Grade I diastolic dysfunction (impaired  relaxation).   2. Right ventricular systolic function is normal. The right ventricular  size is normal. Tricuspid regurgitation signal is inadequate for assessing  PA pressure.   3. The mitral valve is normal in structure. Trivial mitral valve  regurgitation. No evidence of mitral stenosis.   4. The aortic valve is tricuspid. There is mild calcification of the  aortic valve. Aortic valve regurgitation is not visualized. No aortic  stenosis is present.   5. The inferior vena cava is dilated in size with <50% respiratory  variability, suggesting right atrial pressure of 15 mmHg.    Discharge Exam: Blood pressure 124/77, pulse 75, temperature 98 F (36.7 C), temperature  source Oral, resp. rate 17, height 5' 4 (1.626 m), weight (!) 140.5 kg, SpO2 98%.   Disposition: Discharge disposition: 01-Home or Self Care       Discharge Instructions     Diet Carb Modified   Complete by: As directed    Increase activity slowly   Complete by: As directed       Allergies as of 05/13/2024       Reactions   Jardiance  [empagliflozin ] Other (See Comments)   Frequent yeast infection   Metformin  And Related Diarrhea        Medication List     STOP taking these medications    brimonidine  0.2 % ophthalmic solution Commonly known as: ALPHAGAN    ciprofloxacin  500 MG tablet Commonly known as: Cipro    diclofenac  Sodium 1 % Gel Commonly known as: Voltaren    Ozempic  (0.25 or 0.5 MG/DOSE) 2 MG/3ML Sopn Generic drug: Semaglutide (0.25 or 0.5MG /DOS)   potassium chloride  10 MEQ tablet Commonly known as: KLOR-CON    prednisoLONE  acetate 1 % ophthalmic suspension Commonly known as: PRED FORTE        TAKE these medications    Accu-Chek Guide test strip Generic drug: glucose blood Use to check blood sugar 3 times daily.   Accu-Chek Guide w/Device Kit 1 kit by Does not apply route in the morning, at noon, and at bedtime. Use to check blood sugar 3 times daily.   Accu-Chek Softclix Lancets lancets Use to check blood sugar 3 times daily.   acetaminophen  500 MG  tablet Commonly known as: TYLENOL  Take 2 tablets (1,000 mg total) by mouth every 6 (six) hours as needed.   amLODipine  10 MG tablet Commonly known as: NORVASC  Take 1 tablet (10 mg total) by mouth daily.   aspirin  EC 81 MG tablet Take 1 tablet (81 mg total) daily by mouth.   atorvastatin  80 MG tablet Commonly known as: LIPITOR Take 1 tablet (80 mg total) by mouth once daily. (PLEASE SCHEDULE AN APPT FOR FUTURE REFILLS)   carvedilol  25 MG tablet Commonly known as: COREG  Take 1 tablet (25 mg total) by mouth 2 (two) times daily with a meal.   cephALEXin  500 MG capsule Commonly known as:  KEFLEX  Take 1 capsule (500 mg total) by mouth 4 (four) times daily for 2 days.   dorzolamide -timolol  2-0.5 % ophthalmic solution Commonly known as: COSOPT  Place 1 drop into both eyes 2 (two) times daily.   ezetimibe  10 MG tablet Commonly known as: ZETIA  Take 1 tablet (10 mg total) by mouth daily. PATIENT MUST CALL AND SCHEDULE OVERDUE ANNUAL APPOINTMENT FOR FURTHER REFILLS.FIRST ATTEMPT   FreeStyle Libre 2 Reader Espiridion Use as directed   [employer] 2 Sensor Misc Change sensor every 2 weeks   glimepiride  2 MG tablet Commonly known as: AMARYL  Take 1 tablet (2 mg total) by mouth daily before breakfast.   insulin  lispro 100 UNIT/ML KwikPen Commonly known as: HumaLOG  KwikPen Inject 8 Units into the skin 3 (three) times daily with meals. What changed:  how much to take when to take this   isosorbide  mononitrate 60 MG 24 hr tablet Commonly known as: IMDUR  Take 1 tablet (60 mg total) by mouth daily.   ketorolac  0.5 % ophthalmic solution Commonly known as: ACULAR  Place 1 drop into both eyes 4 (four) times daily.   Lantus  SoloStar 100 UNIT/ML Solostar Pen Generic drug: insulin  glargine Inject 64 Units into the skin daily.   nitroGLYCERIN  0.4 MG SL tablet Commonly known as: NITROSTAT  Place 1 tab under tongue for chest pain.  May repeat after 5 minutes x 2.  DO NOT TAKE MORE THAN 3 TABS DURING AN EPISODE OF CHEST PAIN.Pt must schedule an overdue followup appt with Cardiology for any more refills. [phone] 1st attempt Thank You   omeprazole  20 MG capsule Commonly known as: PRILOSEC Take 1 capsule (20 mg total) by mouth daily. What changed:  when to take this reasons to take this   potassium chloride  SA 20 MEQ tablet Commonly known as: KLOR-CON  M Take 1 tablet (20 mEq total) by mouth 2 (two) times daily.   tiZANidine  4 MG tablet Commonly known as: Zanaflex  Take 1 tablet (4 mg total) by mouth every 8 (eight) hours as needed for muscle spasms.   TRUEplus 5-Bevel Pen  Needles 32G X 4 MM Misc Generic drug: Insulin  Pen Needle Use as directed.       Time spent: 35 minutes.  SignedBETHA Leatrice LILLETTE Rosario 05/13/2024, 11:35 AM

## 2024-05-14 ENCOUNTER — Ambulatory Visit: Attending: Physician Assistant

## 2024-05-14 ENCOUNTER — Other Ambulatory Visit: Payer: Self-pay

## 2024-05-14 ENCOUNTER — Ambulatory Visit: Payer: Self-pay | Admitting: Internal Medicine

## 2024-05-14 ENCOUNTER — Telehealth: Payer: Self-pay

## 2024-05-14 DIAGNOSIS — R55 Syncope and collapse: Secondary | ICD-10-CM

## 2024-05-14 LAB — CDIFF NAA+O+P+STOOL CULTURE
Toxigenic C. Difficile by PCR: NEGATIVE
Toxigenic C. Difficile by PCR: NEGATIVE

## 2024-05-14 NOTE — Patient Instructions (Signed)
 Visit Information  Thank you for taking time to visit with me today. Please don't hesitate to contact me if I can be of assistance to you.  Discussed on symptoms of presyncope.  Dizziness or lightheadedness  Tunnel vision or blurred vision  Cold, clammy sweat; nausea  Feeling warm, weak, or having a fast heartbeat  Advised on not driving until clear, change positions slowly. Seek medical attention for any further episodes of fainting.     Patient verbalizes understanding of instructions and care plan provided today and agrees to view in MyChart. Active MyChart status and patient understanding of how to access instructions and care plan via MyChart confirmed with patient.     The patient has been provided with contact information for the care management team and has been advised to call with any health related questions or concerns.   Please call the care guide team at [phone] if you need to cancel or reschedule your appointment.   Please call the Suicide and Crisis Lifeline: 988 call the [place]  National Suicide Prevention Lifeline: 609-037-2361 or TTY: 425-162-9832 TTY 6678517484) to talk to a trained counselor if you are experiencing a Mental Health or Behavioral Health Crisis or need someone to talk to.  Havana Baldwin J. Ameena Vesey RN, MSN [place]  [facility], [facility] Health RN Care Manager Direct Dial : [phone]  Fax: [phone] Website: [email or web address]

## 2024-05-14 NOTE — Progress Notes (Unsigned)
Enrolled for [employer] to mail a ZIO AT Live Telemetry monitor to patients address on file.   Dr. Bridgette Christopher to read. 

## 2024-05-14 NOTE — Transitions of Care (Post Inpatient/ED Visit) (Signed)
 05/14/2024  Name: Kimberly Frazier MRN: [id number] DOB: 23-Jul-1960  Today's TOC FU Call Status: Today's TOC FU Call Status:: Successful TOC FU Call Completed TOC FU Call Complete Date: 05/14/24  Patient's Name and Date of Birth confirmed. Name, DOB  Transition Care Management Follow-up Telephone Call Date of Discharge: 05/13/24 Discharge Facility: Kimberly Frazier [facility]) Type of Discharge: Inpatient Admission Primary Inpatient Discharge Diagnosis:: Syncope and collapse How have you been since you were released from the hospital?: Better Any questions or concerns?: No  Items Reviewed: Did you receive and understand the discharge instructions provided?: Yes Medications obtained,verified, and reconciled?: Yes (Medications Reviewed) Any new allergies since your discharge?: No Dietary orders reviewed?: Yes Type of Diet Ordered:: Carb modified Do you have support at home?: Yes People in Home [RPT]: child(ren), adult Name of Support/Comfort Primary Source: Kimberly Frazier  Medications Reviewed Today: Medications Reviewed Today     Reviewed by Kimberly Burkemper, RN (Case Manager) on 05/14/24 at 1534  Med List Status: <None>   Medication Order Taking? Sig Documenting Provider Last Dose Status Informant  Accu-Chek Softclix Lancets lancets [id number] Yes Use to check blood sugar 3 times daily. Kimberly Barnie NOVAK, MD  Active Self, Pharmacy Records  acetaminophen  (TYLENOL ) 500 MG tablet [id number] Yes Take 2 tablets (1,000 mg total) by mouth every 6 (six) hours as needed. Kimberly Bottcher, PA-C  Active Self, Pharmacy Records  amLODipine  (NORVASC ) 10 MG tablet [id number] Yes Take 1 tablet (10 mg total) by mouth daily. Kimberly Barnie NOVAK, MD  Active Self, Pharmacy Records  aspirin  EC 81 MG tablet [id number] Yes Take 1 tablet (81 mg total) daily by mouth. Kimberly Barnie NOVAK, MD  Active Self, Pharmacy Records  atorvastatin  (LIPITOR) 80 MG tablet [id number] Yes Take 1 tablet (80 mg total) by mouth once daily. (PLEASE  SCHEDULE AN APPT FOR FUTURE REFILLS) Kimberly Barnie NOVAK, MD  Active Self, Pharmacy Records  Blood Glucose Monitoring Suppl (ACCU-CHEK GUIDE) w/Device KIT [id number] Yes 1 kit by Does not apply route in the morning, at noon, and at bedtime. Use to check blood sugar 3 times daily. Kimberly Barnie NOVAK, MD  Active Self, Pharmacy Records  carvedilol  (COREG ) 25 MG tablet [id number] Yes Take 1 tablet (25 mg total) by mouth 2 (two) times daily with a meal. Kimberly Barnie NOVAK, MD  Active Self, Pharmacy Records  cephALEXin  (KEFLEX ) 500 MG capsule [id number] Yes Take 1 capsule (500 mg total) by mouth 4 (four) times daily for 2 days. Kimberly Leatrice FERNS, MD  Active   Continuous Blood Gluc Receiver (FREESTYLE LIBRE 2 READER) DEVI [id number]  Use as directed  Patient not taking: Reported on 05/14/2024   Kimberly Barnie NOVAK, MD  Active Self, Pharmacy Records           Med Note [facility], DUROJAHYE' R   Thu May 10, 2024  9:53 PM) Not currently using  Continuous Blood Gluc Sensor (FREESTYLE LIBRE SENSOR SYSTEM) MISC [id number]  Change sensor every 2 weeks  Patient not taking: Reported on 05/14/2024   Kimberly Barnie NOVAK, MD  Active Self, Pharmacy Records           Med Note [facility], DUROJAHYE' R   Thu May 10, 2024  9:53 PM) Not currently using  dorzolamide -timolol  (COSOPT ) 2-0.5 % ophthalmic solution [id number] Yes Place 1 drop into both eyes 2 (two) times daily. Kimberly Rogue, MD  Active Self, Pharmacy Records  ezetimibe  (ZETIA ) 10 MG tablet [id number] Yes Take 1 tablet (10 mg total) by mouth daily. PATIENT MUST CALL AND SCHEDULE OVERDUE  ANNUAL APPOINTMENT FOR FURTHER REFILLS.FIRST ATTEMPT Kimberly Reche RAMAN, NP  Active Self, Pharmacy Records  glimepiride  (AMARYL ) 2 MG tablet [id number] Yes Take 1 tablet (2 mg total) by mouth daily before breakfast. Kimberly Barnie NOVAK, MD  Active Self, Pharmacy Records  glucose blood (ACCU-CHEK GUIDE) test strip [id number] Yes Use to check blood sugar 3 times daily. Kimberly Barnie NOVAK, MD   Active Self, Pharmacy Records  insulin  glargine (LANTUS  SOLOSTAR) 100 UNIT/ML Solostar Pen [id number] Yes Inject 64 Units into the skin daily. Kimberly Barnie NOVAK, MD  Active Self, Pharmacy Records  insulin  lispro (HUMALOG  [facility]) 100 UNIT/ML KwikPen [id number] Yes Inject 8 Units into the skin 3 (three) times daily with meals. Kimberly Leatrice FERNS, MD  Active   Insulin  Pen Needle (TECHLITE PEN NEEDLES) 32G X 4 MM MISC [id number] Yes Use as directed. Kimberly Barnie NOVAK, MD  Active Self, Pharmacy Records  isosorbide  mononitrate (IMDUR ) 60 MG 24 hr tablet [id number] Yes Take 1 tablet (60 mg total) by mouth daily. Kimberly Slain, MD  Active Self, Pharmacy Records  ketorolac  (ACULAR ) 0.5 % ophthalmic solution [id number] Yes Place 1 drop into both eyes 4 (four) times daily. Kimberly Rogue, MD  Active Self, Pharmacy Records  nitroGLYCERIN  (NITROSTAT ) 0.4 MG SL tablet [id number] Yes Place 1 tab under tongue for chest pain.  May repeat after 5 minutes x 2.  DO NOT TAKE MORE THAN 3 TABS DURING AN EPISODE OF CHEST PAIN.Pt must schedule an overdue followup appt with Cardiology for any more refills. [phone] 1st attempt Thank Kimberly Frazier Kimberly Slain, MD  Active Self, Pharmacy Records  omeprazole  [facility]) 20 MG capsule [id number] Yes Take 1 capsule (20 mg total) by mouth daily.  Patient taking differently: Take 20 mg by mouth as needed (Acid reflux).   Kimberly Barnie NOVAK, MD  Active Self, Pharmacy Records  potassium chloride  SA (KLOR-CON  M) 20 MEQ tablet [id number] Yes Take 1 tablet (20 mEq total) by mouth 2 (two) times daily. Kimberly Leatrice FERNS, MD  Active   tiZANidine  (ZANAFLEX ) 4 MG tablet [id number] Yes Take 1 tablet (4 mg total) by mouth every 8 (eight) hours as needed for muscle spasms. Kimberly Bottcher, PA-C  Active Self, Pharmacy Records            Home Care and Equipment/Supplies: Were Home Health Services Ordered?: NA Any new equipment or medical supplies ordered?: NA  Functional  Questionnaire: Do you need assistance with bathing/showering or dressing?: No Do you need assistance with meal preparation?: No Do you need assistance with eating?: No Do you have difficulty maintaining continence: Yes (Stress urinary incontinence) Do you need assistance with getting out of bed/getting out of a chair/moving?: No Do you have difficulty managing or taking your medications?: No  Follow up appointments reviewed: PCP Follow-up appointment confirmed?: No (Patient reports that she will call PCP when she finishes call with CM) MD Provider Line [phone] Given: No Specialist Hospital Follow-up appointment confirmed?: NA Do you need transportation to your follow-up appointment?: No (Discussed with patient  [employer] transportation for dual program.  Patient to call [facility].) Do you understand care options if your condition(s) worsen?: Yes-patient verbalized understanding  SDOH Interventions Today    Flowsheet Row Most Recent Value  SDOH Interventions   Food Insecurity Interventions Intervention Not Indicated  Housing Interventions Intervention Not Indicated  Transportation Interventions Community Resources Provided  [facility]  [facility] transportation. Patient will call]  Utilities Interventions Intervention Not Indicated    Kimberly Lantry J. Devonia Farro RN, MSN [place]  [facility], Population  Health RN Care Manager Direct Dial : [phone]  Fax: [phone] Website: [email or web address]

## 2024-05-15 ENCOUNTER — Telehealth: Payer: Self-pay | Admitting: Internal Medicine

## 2024-05-15 NOTE — Telephone Encounter (Signed)
-----   Message from Barnie Louder, MD sent at 05/14/2024  9:26 PM EST ----- Regarding: f/u appt Give f/u appt with me in 6-8 weeks.

## 2024-05-15 NOTE — Telephone Encounter (Signed)
 Contacted patient, unable to reach. Voicemail could not be left due to call can not be completed as dialed.

## 2024-05-16 NOTE — Telephone Encounter (Signed)
 You can send her the appt via Mychart.

## 2024-05-18 ENCOUNTER — Other Ambulatory Visit: Payer: Self-pay

## 2024-05-18 NOTE — Telephone Encounter (Addendum)
 Contacted patient today unable to reach, Voicemail left requesting a call back to schedule follow-up appointment. Will follow-up again next week.

## 2024-05-20 ENCOUNTER — Telehealth: Payer: Self-pay | Admitting: Internal Medicine

## 2024-05-20 NOTE — Telephone Encounter (Signed)
-----   Message from Gargatha H sent at 05/14/2024  9:08 AM EST ----- Regarding: RE: Hosp f/u Called patient LVM to call back to schedule HOS F/U ----- Message ----- From: [name], MD Sent: 05/10/2024   5:46 PM EST To: [facility] Subject: Hosp f/u                                       Needs ER f/u with me in 2-3 wks

## 2024-05-21 ENCOUNTER — Other Ambulatory Visit: Payer: Self-pay | Admitting: Internal Medicine

## 2024-05-21 ENCOUNTER — Other Ambulatory Visit: Payer: Self-pay

## 2024-05-21 DIAGNOSIS — K219 Gastro-esophageal reflux disease without esophagitis: Secondary | ICD-10-CM

## 2024-05-21 MED ORDER — OMEPRAZOLE 20 MG PO CPDR
20.0000 mg | DELAYED_RELEASE_CAPSULE | Freq: Every day | ORAL | 0 refills | Status: DC
  Filled 2024-05-21: qty 30, 30d supply, fill #0

## 2024-05-25 ENCOUNTER — Other Ambulatory Visit: Payer: Self-pay

## 2024-05-25 ENCOUNTER — Other Ambulatory Visit: Payer: Self-pay | Admitting: Family

## 2024-05-25 DIAGNOSIS — I2510 Atherosclerotic heart disease of native coronary artery without angina pectoris: Secondary | ICD-10-CM

## 2024-05-25 MED ORDER — EZETIMIBE 10 MG PO TABS
10.0000 mg | ORAL_TABLET | Freq: Every day | ORAL | 0 refills | Status: AC
  Filled 2024-05-25: qty 90, 90d supply, fill #0

## 2024-05-28 NOTE — Telephone Encounter (Signed)
 Contacted patient, Appointment scheduled.

## 2024-06-01 ENCOUNTER — Other Ambulatory Visit: Payer: Self-pay

## 2024-06-07 NOTE — Progress Notes (Shared)
 " [facility] - Clinic Note  06/13/2024     CHIEF COMPLAINT Patient presents for No chief complaint on file.   HISTORY OF PRESENT ILLNESS: Kimberly Frazier is a 64 y.o. female who presents to the clinic today for:    Patient late to f/u 9 weeks instead of 7 weeks. States vision seems the same OU.  Referring physician: [name], MD [address] [place],  [place] [address]  HISTORICAL INFORMATION:   Selected notes from the MEDICAL RECORD NUMBER Referred by Dr. Oneil Platts for concern of CME    CURRENT MEDICATIONS: Current Outpatient Medications (Ophthalmic Drugs)  Medication Sig   dorzolamide -timolol  (COSOPT ) 2-0.5 % ophthalmic solution Place 1 drop into both eyes 2 (two) times daily.   ketorolac  (ACULAR ) 0.5 % ophthalmic solution Place 1 drop into both eyes 4 (four) times daily.   No current facility-administered medications for this visit. (Ophthalmic Drugs)   Current Outpatient Medications (Other)  Medication Sig   Accu-Chek Softclix Lancets lancets Use to check blood sugar 3 times daily.   acetaminophen  (TYLENOL ) 500 MG tablet Take 2 tablets (1,000 mg total) by mouth every 6 (six) hours as needed.   amLODipine  (NORVASC ) 10 MG tablet Take 1 tablet (10 mg total) by mouth daily.   aspirin  EC 81 MG tablet Take 1 tablet (81 mg total) daily by mouth.   atorvastatin  (LIPITOR) 80 MG tablet Take 1 tablet (80 mg total) by mouth once daily. (PLEASE SCHEDULE AN APPT FOR FUTURE REFILLS)   Blood Glucose Monitoring Suppl (ACCU-CHEK GUIDE) w/Device KIT 1 kit by Does not apply route in the morning, at noon, and at bedtime. Use to check blood sugar 3 times daily.   carvedilol  (COREG ) 25 MG tablet Take 1 tablet (25 mg total) by mouth 2 (two) times daily with a meal.   Continuous Blood Gluc Receiver (FREESTYLE LIBRE 2 READER) DEVI Use as directed (Patient not taking: Reported on 05/14/2024)   Continuous Blood Gluc Sensor (FREESTYLE LIBRE SENSOR SYSTEM) MISC  Change sensor every 2 weeks (Patient not taking: Reported on 05/14/2024)   ezetimibe  (ZETIA ) 10 MG tablet Take 1 tablet (10 mg total) by mouth daily.   glimepiride  (AMARYL ) 2 MG tablet Take 1 tablet (2 mg total) by mouth daily before breakfast.   glucose blood (ACCU-CHEK GUIDE) test strip Use to check blood sugar 3 times daily.   insulin  glargine (LANTUS  SOLOSTAR) 100 UNIT/ML Solostar Pen Inject 64 Units into the skin daily.   insulin  lispro (HUMALOG  KWIKPEN) 100 UNIT/ML KwikPen Inject 8 Units into the skin 3 (three) times daily with meals.   Insulin  Pen Needle (TECHLITE PEN NEEDLES) 32G X 4 MM MISC Use as directed.   isosorbide  mononitrate (IMDUR ) 60 MG 24 hr tablet Take 1 tablet (60 mg total) by mouth daily.   nitroGLYCERIN  (NITROSTAT ) 0.4 MG SL tablet Place 1 tab under tongue for chest pain.  May repeat after 5 minutes x 2.  DO NOT TAKE MORE THAN 3 TABS DURING AN EPISODE OF CHEST PAIN.Pt must schedule an overdue followup appt with Cardiology for any more refills. [phone] 1st attempt Thank You   omeprazole  (PRILOSEC) 20 MG capsule Take 1 capsule (20 mg total) by mouth daily.   potassium chloride  SA (KLOR-CON  M) 20 MEQ tablet Take 1 tablet (20 mEq total) by mouth 2 (two) times daily.   tiZANidine  (ZANAFLEX ) 4 MG tablet Take 1 tablet (4 mg total) by mouth every 8 (eight) hours as needed for muscle  spasms.   No current facility-administered medications for this visit. (Other)   REVIEW OF SYSTEMS:   ALLERGIES Allergies  Allergen Reactions   Jardiance  [Empagliflozin ] Other (See Comments)    Frequent yeast infection   Metformin  And Related Diarrhea   PAST MEDICAL HISTORY Past Medical History:  Diagnosis Date   Adrenal adenoma, left    Arthritis    Cancer (HCC)    CKD (chronic kidney disease), stage II    Coronary artery disease 07-28-2018 followed [facility]  currently due to no insurance   per cardiac cath 02-06-2015 (positive mild lateral ishcemia on stress  test)--- dLAD 80%,  mLAD 40%,  mPDA 80% (small vessel),  ostial D1 70%,  CFx with lumial irregarlities-- medical management   Depression    Diabetic neuropathy (HCC)    Diabetic retinopathy (HCC)    NPDR OU   Headache    History of Bell's palsy 07/2011   per pt residual facial pain on left side occasionally   History of cancer of vagina 1999   per pt completed radiation and chemo   History of sepsis 12/30/2017   positive blood culter fro E.coli   Hyperlipidemia    Hypertension    Hypertensive retinopathy    OU   Hypokalemia    Insulin  dependent type 2 diabetes mellitus, uncontrolled    followed by pcp---  A1c was 11.7 on 06-15-2018 in epic   Myocardial infarction [facility])    10 yrs ago?   Nocturia    Peripheral neuropathy    PMB (postmenopausal bleeding)    Wears dentures    upper   Wears glasses    Past Surgical History:  Procedure Laterality Date   BREAST BIOPSY  2012   benign   CARDIAC CATHETERIZATION N/A 02/06/2015   Procedure: Left Heart Cath and Coronary Angiography;  Surgeon: Ezra GORMAN Shuck, MD;  Location: [facility] INVASIVE CV LAB;  Service: Cardiovascular;  Laterality: N/A;   CATARACT EXTRACTION Bilateral OD: 03/07/19, OS: 02/28/19   Dr. Roz   CATARACT EXTRACTION, BILATERAL     COLONOSCOPY     normal exam in [place], [place] abour 10-12 years ago   EYE SURGERY Bilateral OD: 03/07/19, OS: 02/28/19   Cat Sx - Dr. Roz   HYSTEROSCOPY WITH D & C N/A 08/01/2018   Procedure: DILATATION AND CURETTAGE /HYSTEROSCOPY;  Surgeon: Alger Gong, MD;  Location: [facility];  Service: Gynecology;  Laterality: N/A;   ROBOTIC ASSISTED TOTAL HYSTERECTOMY WITH BILATERAL SALPINGO OOPHERECTOMY N/A 11/28/2018   Procedure: XI ROBOTIC ASSISTED TOTAL HYSTERECTOMY WITH BILATERAL SALPINGO OOPHORECTOMY;  Surgeon: Eloy Herring, MD;  Location: [facility] ORS;  Service: Gynecology;  Laterality: N/A;   SENTINEL NODE BIOPSY N/A 11/28/2018   Procedure: SENTINEL NODE BIOPSY;  Surgeon: Eloy Herring, MD;  Location: [facility];  Service: Gynecology;  Laterality: N/A;   UMBILICAL HERNIA REPAIR  child   FAMILY HISTORY Family History  Problem Relation Age of Onset   Heart disease Mother    Hypertension Mother    Diabetes Mother    Cancer Father        hodgkins lymphoma   Heart disease Sister        heart attack   Diabetes Sister    Colon cancer Brother 76   Diabetes Maternal Aunt    Diabetes Maternal Uncle    Cancer Other        parent   Diabetes Other        parent   Heart disease Other  parent   Hyperlipidemia Other        parent   Hypertension Other        parent   Arthritis Other        parent   Breast cancer Neg Hx    Colon polyps Neg Hx    Esophageal cancer Neg Hx    Rectal cancer Neg Hx    Stomach cancer Neg Hx    SOCIAL HISTORY Social History   Tobacco Use   Smoking status: Never   Smokeless tobacco: Never  Vaping Use   Vaping status: Never Used  Substance Use Topics   Alcohol  use: Not Currently   Drug use: Not Currently       OPHTHALMIC EXAM:  Not recorded    IMAGING AND PROCEDURES  Imaging and Procedures for 06/13/2024          ASSESSMENT/PLAN:  No diagnosis found.  1-4. Moderate Non-proliferative diabetic retinopathy, OU OD with combination DME/CME  - last A1c 8.7 (11.12.24), 8.2 (03.08.24); 9.6 (08.07.23), 10.1 (1.31.23)  - pt has stopped Ozempic  recently due to change in vision - h/o delayed to follow up from September 2021 to April 2022--7 mos instead of 4 wks**             - exam shows scattered IRH, edema and tortuous blood vessels OU - FA (10.23.20) shows leaking MA OU and paracentral petaloid hyperfluorecence OD --> CME/[place] - s/p IVA OD #1 (11.06.20), #2 (12.18.20), #3 (02.24.21), #4 (03.24.21), #5 (04.21.21), #6 (5.26.21), #7 (07.26.21), #8 (08.25.21), #9 (09.23.21), #10 (04.26.22), #11 (01.08.25), #12 (02.26.25), #13 (04.16.25), #14 (06.04.25), #15 (08.06.25) - s/p IVA OS #1 (10.23.20), #2 (11.18.20), #3  (12.18.20), #4 (02.24.21), #5 (03.24.21) -- IVA resistance  ============================= - s/p IVE OD #1 (05.25.22), #2 (07.01.22), #3 (07.29.22), #4 (08.29.22 - JDM), #5 (09.26.22), #6 (10.24.22), #7 (11.21.22), #8 (12.19.22), #9 (01.23.23), #10 (02.20.23), #11 (03.22.23), #12 (04.19.23), #13 (05.17.23), #14 (06.14.23), #15 (08.04.23), #16 (09.01.23), #17 (10.10.23), #18 (11.14.23), #19 (12.19.23), #20 (02.13.24), #21 (03.20.24), #22 (03.20.24), #23 (03.20.24), #24 (04.24.24) #25(06.28.24), #26 (08.16.24), #27 (10.02.24), #28 (11.20.24) - s/p IVE OS #1 (04.21.21), #2 (5.26.21), #3 (07.26.21), #4 (08.25.21), #5 (09.23.21), #6 (04.26.22), #7 (05.25.22), #8 (07.01.22), #9 (07.29.22), #10 (08.29.22 - JDM), #11 (09.26.22), #12 (10.24.22) -- IVE resistance ============================= - s/p IVV OS #1 (11.21.22, sample), #2 (12.19.22), #3 (01.23.23), #4 (02.20.23), #5 (03.22.23), #6 (04.19.23), #7 (05.17.23), #8 (06.14.23, sample), #9 (08.04.23), #10 (9.01.23), #11 (10.10.23), #12 (11.14.23), #13 (12.19.23), #14 (02.13.24), #15 (03.20.24) #16(3.20.24), #17 (03.20.24), #18 (04.24.24) #19(06.28.24), #20 (08.16.24), #21 (10.02.24), #22 (11.20.24), #23 (01.08.25 -- SAMPLE), #24 (02.26.25 -- SAMPLE), #25 (04.16.25 - SAMPLE), #26 (06.04.25--SAMPLE), #27 (sample--08.06.25) - BCVA OD 20/20 -- stable; OS stable at 20/150  - OCT today: OD: Persistent IRF/cystic changes inferior macula and fovea--slightly increased; OS: Persistent IRF/IRHM inferior and temporal mac and fovea -- slightly improved, +central ORA at 9 weeks - recommend IVA OD #16 and IVV OS #28 [sample] today, 01.14.26 with follow up dec to 7 weeks - [employer] funding unavailable at this time -- pt wishes to proceed w/ injections             - RBA of procedure discussed, questions answered - Vabysmo  OS consent form signed and scanned on 11.20.24 - Avastin  OD informed consent signed and scanned on 01.08.25 - see procedure note             - continue  Ketorolac  QID OU             -  f/u in 7 weeks, DFE, OCT, possible injection OU   5,6. Hypertensive retinopathy OU             - discussed importance of tight BP control             - continue to monitor   7. Pseudophakia OU            - s/p CE/IOL OU [place]), OD: 03/07/2019, OS: 02/28/2019             - IOLs in good position             - CME OU as above             - continue to monitor   8. Ocular Hypertension OU             - IOP: 21, 19 - h/o steroid response             - cont Cosopt  BID OU and Brimonidine  BID OU  9. Dry eyes OU - recommend artificial tears and lubricating ointment as needed   Ophthalmic Meds Ordered this visit:  No orders of the defined types were placed in this encounter.    No follow-ups on file.  There are no Patient Instructions on file for this visit.  This document serves as a record of services personally performed by Redell JUDITHANN Hans, MD, PhD. It was created on their behalf by Almetta Pesa, an ophthalmic technician. The creation of this record is the provider's dictation and/or activities during the visit.    Electronically signed by: Almetta Pesa, OA, 06/07/2024  7:43 AM    Redell JUDITHANN Hans, M.D., Ph.D. Diseases & Surgery of the Retina and Vitreous [facility]   Abbreviations: M myopia (nearsighted); A astigmatism; H hyperopia (farsighted); P presbyopia; Mrx spectacle prescription;  CTL contact lenses; OD right eye; OS left eye; OU both eyes  XT exotropia; ET esotropia; PEK punctate epithelial keratitis; PEE punctate epithelial erosions; DES dry eye syndrome; MGD meibomian gland dysfunction; ATs artificial tears; PFAT's preservative free artificial tears; NSC nuclear sclerotic cataract; PSC posterior subcapsular cataract; ERM epi-retinal membrane; PVD posterior vitreous detachment; RD retinal detachment; DM diabetes mellitus; DR diabetic retinopathy; NPDR non-proliferative diabetic retinopathy; PDR proliferative  diabetic retinopathy; CSME clinically significant macular edema; DME diabetic macular edema; dbh dot blot hemorrhages; CWS cotton wool spot; POAG primary open angle glaucoma; C/D cup-to-disc ratio; HVF humphrey visual field; GVF goldmann visual field; OCT optical coherence tomography; IOP intraocular pressure; BRVO Branch retinal vein occlusion; CRVO central retinal vein occlusion; CRAO central retinal artery occlusion; BRAO branch retinal artery occlusion; RT retinal tear; SB scleral buckle; PPV pars plana vitrectomy; VH Vitreous hemorrhage; PRP panretinal laser photocoagulation; IVK intravitreal kenalog; VMT vitreomacular traction; MH Macular hole;  NVD neovascularization of the disc; NVE neovascularization elsewhere; AREDS age related eye disease study; ARMD age related macular degeneration; POAG primary open angle glaucoma; EBMD epithelial/anterior basement membrane dystrophy; ACIOL anterior chamber intraocular lens; IOL intraocular lens; PCIOL posterior chamber intraocular lens; Phaco/IOL phacoemulsification with intraocular lens placement; PRK photorefractive keratectomy; LASIK laser assisted in situ keratomileusis; HTN hypertension; DM diabetes mellitus; COPD chronic obstructive pulmonary disease "

## 2024-06-11 ENCOUNTER — Encounter (HOSPITAL_BASED_OUTPATIENT_CLINIC_OR_DEPARTMENT_OTHER): Payer: Self-pay

## 2024-06-12 ENCOUNTER — Ambulatory Visit (INDEPENDENT_AMBULATORY_CARE_PROVIDER_SITE_OTHER): Admitting: Cardiology

## 2024-06-12 ENCOUNTER — Encounter (HOSPITAL_BASED_OUTPATIENT_CLINIC_OR_DEPARTMENT_OTHER): Payer: Self-pay | Admitting: Cardiology

## 2024-06-12 VITALS — BP 122/70 | HR 87 | Ht 64.0 in | Wt 320.6 lb

## 2024-06-12 DIAGNOSIS — E7800 Pure hypercholesterolemia, unspecified: Secondary | ICD-10-CM

## 2024-06-12 DIAGNOSIS — I10 Essential (primary) hypertension: Secondary | ICD-10-CM

## 2024-06-12 DIAGNOSIS — Z09 Encounter for follow-up examination after completed treatment for conditions other than malignant neoplasm: Secondary | ICD-10-CM

## 2024-06-12 DIAGNOSIS — I3139 Other pericardial effusion (noninflammatory): Secondary | ICD-10-CM

## 2024-06-12 DIAGNOSIS — E1169 Type 2 diabetes mellitus with other specified complication: Secondary | ICD-10-CM

## 2024-06-12 DIAGNOSIS — I25118 Atherosclerotic heart disease of native coronary artery with other forms of angina pectoris: Secondary | ICD-10-CM | POA: Diagnosis not present

## 2024-06-12 DIAGNOSIS — R55 Syncope and collapse: Secondary | ICD-10-CM

## 2024-06-12 DIAGNOSIS — Z712 Person consulting for explanation of examination or test findings: Secondary | ICD-10-CM

## 2024-06-12 NOTE — Progress Notes (Signed)
 " Cardiology Office Note:  .   Date:  06/13/2024  ID:  Kimberly Frazier, DOB 1961/03/18, MRN [id number] PCP: [name], MD  [facility] Providers Cardiologist:  Shelda Bruckner, MD {  History of Present Illness: .   Kimberly Frazier is a 64 y.o. female with PMH CAD, hypertension, hypercholesterolemia, type II diabetes on insulin , obnesity. I last saw her in 2022.  Pertinent CV history: CT coronary 09/29/19 with Ca score 350 (97th %ile), mild stenosis of mid RCA, moderate LAD stenosis in mid vessel and CTO of distal vessel, minimal stenosis diffusely in Lcx. Prior cath in 2016 had shown 80% distal stenosis in the lAD and 80% mid rPDA stenosis (small vessel), managed medically.  Today: She was seen in the hospital 12/11-12/14/25 for syncope. She had one brief episode of NSVT on telemetry, with other events felt to be artifact. Monitor ordered at that time, reviewed today. No high degree block, no rapid arrhythmia to suggest etiology for her syncope. One brief SVT and one brief NSVT vs. SVT with aberrancy, not high risk. Echo done during admission did not show significant LVOT gradient. EF 50% with apical wall motion abnormalities, severe LVH, no thrombus. No significant valve disease.  Seen for hospital follow up today. No syncope since her discharge. She tells me that with some of these episodes she can hear people but not respond to them. She has had two episodes when she did feel like she completely lost consciousness. One was after a fall in her bathroom, woke up dazed on the floor. This happened shortly before her recent hospitalization. No chest pain, no racing heartbeats prior. Did feel sick at the time, was having GI symptoms, got up to use the bathroom and felt woozy/tired before she passed out.  Feels short of breath with walking short distances but feels more limited by her back.   ROS: Denies chest pain, shortness of breath at rest. No PND, orthopnea, LE edema or unexpected  weight gain. No palpitations. ROS otherwise negative except as noted.   Studies Reviewed: SABRA    EKG:       Physical Exam:   VS:  BP 122/70   Pulse 87   Ht 5' 4 (1.626 m)   Wt (!) 320 lb 9.6 oz (145.4 kg)   SpO2 97%   BMI 55.03 kg/m    Wt Readings from Last 3 Encounters:  06/12/24 (!) 320 lb 9.6 oz (145.4 kg)  05/13/24 (!) 309 lb 11.9 oz (140.5 kg)  05/10/24 (!) 326 lb (147.9 kg)    GEN: Well nourished, well developed in no acute distress HEENT: Normal, moist mucous membranes NECK: No JVD CARDIAC: regular rhythm, normal S1 and S2, no rubs or gallops. No murmur. VASCULAR: Radial and DP pulses 2+ bilaterally. No carotid bruits RESPIRATORY:  Clear to auscultation without rales, wheezing or rhonchi  ABDOMEN: Soft, non-tender, non-distended MUSCULOSKELETAL:  Ambulates independently SKIN: Warm and dry, no edema NEUROLOGIC:  Alert and oriented x 3. No focal neuro deficits noted. PSYCHIATRIC:  Normal affect    ASSESSMENT AND PLAN: .     Possible syncope: reports episodes at home, then had witness episode that was ?loss of consciousness, patient felt to be staring off. -reviewed echo, monitor test results as above -reportedly had brief NSVT in the hospital, one brief episode on monitor (not fast). These episodes were asymptomatic. We discussed clinically monitoring vs. Loop recorder implantation. After shared decision making, she wants to give it 6 mos and then re-address -recommended  to make sure her K stays above 4. Has not had recheck since discharge   CAD Hypercholesterolemia  -cath in 2016, CT 2021 summarized as above. Likely CTO distal LAD with apical infarct -continue amlodipine , imdur  for BP and microvascular circulation -has nitroglycerin  PRN -continue aspirin  81 mg daily -continue carvedilol  25 mg BID, amlodipine  10 mg daily, imdur  60 mg daily as antianginals and antihypertensives -with diabetes, LDL goal <55. Most recent lipids 05/08/24 with TG 160 (historically well  controlled), LDL 80 -discussed that LDL not at goal on atorvastatin  80 mg daily and ezetimibe  10 mg daily. Would recheck at follow up, if not at goal, discuss PCSK9i -instructed on red flag warning signs that need immediate medical attention -discussed avoidance of NSAIDs, danger of MI with CAD.  Hypertension Chronic kidney disease stage 3b -goal of <130/80 -continue carvedilol , amlodipine , and imdur  as above   Pericardial effusion: has been seen chronically, ranging from trivial-large -most recent echo said none, on my review it is very trivial   Type II diabetes Obesity, BMI 55 -last A1c 10.1 -on glimeperide, insulin  -Jardiance  stopped after recurrent yeast infections 05/2021 -appears she was on trulicity  remotely. Was on Ozempic , had not started back on this yet and then was told after her hospitalization not to start until she discussed with her PCP. If reasonable otherwise, would agree with GLP given CAD.  CV risk counseling and prevention -recommend heart healthy/Mediterranean diet, with whole grains, fruits, vegetable, fish, lean meats, nuts, and olive oil. Limit salt. -recommend moderate walking, 3-5 times/week for 30-50 minutes each session. Aim for at least 150 minutes/week. Goal should be pace of 3 miles/hours, or walking 1.5 miles in 30 minutes -recommend avoidance of tobacco products. Avoid excess alcohol .  Dispo: 6 mos  Total time of encounter: I spent 51 minutes dedicated to the care of this patient on the date of this encounter to include pre-visit review of records, face-to-face time with the patient discussing conditions above, and clinical documentation with the electronic health record. We specifically spent time today discussing her hospitalization, symptoms, test results, medication management, signs/symptoms that need immediate medical attention.   Signed, Shelda Bruckner, MD   Shelda Bruckner, MD, PhD, [facility] [place]  [facility]  [place]   Heart & Vascular at [facility] at [facility] [address], [address] [place], [place] [address] [phone]   "

## 2024-06-12 NOTE — Patient Instructions (Signed)

## 2024-06-13 ENCOUNTER — Ambulatory Visit: Payer: Self-pay | Admitting: Physician Assistant

## 2024-06-13 ENCOUNTER — Encounter (INDEPENDENT_AMBULATORY_CARE_PROVIDER_SITE_OTHER): Admitting: Ophthalmology

## 2024-06-13 DIAGNOSIS — Z7985 Long-term (current) use of injectable non-insulin antidiabetic drugs: Secondary | ICD-10-CM

## 2024-06-13 DIAGNOSIS — H04123 Dry eye syndrome of bilateral lacrimal glands: Secondary | ICD-10-CM

## 2024-06-13 DIAGNOSIS — Z794 Long term (current) use of insulin: Secondary | ICD-10-CM

## 2024-06-13 DIAGNOSIS — Z961 Presence of intraocular lens: Secondary | ICD-10-CM

## 2024-06-13 DIAGNOSIS — H35033 Hypertensive retinopathy, bilateral: Secondary | ICD-10-CM

## 2024-06-13 DIAGNOSIS — H40053 Ocular hypertension, bilateral: Secondary | ICD-10-CM

## 2024-06-13 DIAGNOSIS — E113313 Type 2 diabetes mellitus with moderate nonproliferative diabetic retinopathy with macular edema, bilateral: Secondary | ICD-10-CM

## 2024-06-13 DIAGNOSIS — H35353 Cystoid macular degeneration, bilateral: Secondary | ICD-10-CM

## 2024-06-13 DIAGNOSIS — I10 Essential (primary) hypertension: Secondary | ICD-10-CM

## 2024-06-21 ENCOUNTER — Other Ambulatory Visit: Payer: Self-pay

## 2024-06-25 ENCOUNTER — Other Ambulatory Visit: Payer: Self-pay

## 2024-06-25 ENCOUNTER — Other Ambulatory Visit: Payer: Self-pay | Admitting: Internal Medicine

## 2024-06-25 DIAGNOSIS — K219 Gastro-esophageal reflux disease without esophagitis: Secondary | ICD-10-CM

## 2024-06-25 MED ORDER — OMEPRAZOLE 20 MG PO CPDR
20.0000 mg | DELAYED_RELEASE_CAPSULE | Freq: Every day | ORAL | 0 refills | Status: AC
  Filled 2024-06-25 – 2024-07-05 (×2): qty 90, 90d supply, fill #0

## 2024-07-04 ENCOUNTER — Other Ambulatory Visit: Payer: Self-pay

## 2024-07-05 ENCOUNTER — Other Ambulatory Visit: Payer: Self-pay

## 2024-07-05 ENCOUNTER — Ambulatory Visit: Payer: Self-pay | Admitting: Internal Medicine

## 2024-07-17 ENCOUNTER — Ambulatory Visit: Admitting: Internal Medicine
